# Patient Record
Sex: Male | Born: 1950 | Race: Black or African American | Hispanic: No | Marital: Married | Smoking: Former smoker
Health system: Southern US, Community
[De-identification: ages and names within clinical notes are randomized; demographics above are authoritative.]

## PROBLEM LIST (undated history)

## (undated) ENCOUNTER — Emergency Department (HOSPITAL_COMMUNITY): Discharge status: Absent without leave | Payer: Medicare Other

## (undated) DIAGNOSIS — J45909 Unspecified asthma, uncomplicated: Secondary | ICD-10-CM

## (undated) DIAGNOSIS — I248 Other forms of acute ischemic heart disease: Secondary | ICD-10-CM

## (undated) DIAGNOSIS — E669 Obesity, unspecified: Secondary | ICD-10-CM

## (undated) DIAGNOSIS — N183 Chronic kidney disease, stage 3 unspecified: Secondary | ICD-10-CM

## (undated) DIAGNOSIS — I340 Nonrheumatic mitral (valve) insufficiency: Secondary | ICD-10-CM

## (undated) DIAGNOSIS — D631 Anemia in chronic kidney disease: Secondary | ICD-10-CM

## (undated) DIAGNOSIS — R7989 Other specified abnormal findings of blood chemistry: Secondary | ICD-10-CM

## (undated) DIAGNOSIS — I219 Acute myocardial infarction, unspecified: Secondary | ICD-10-CM

## (undated) DIAGNOSIS — I2510 Atherosclerotic heart disease of native coronary artery without angina pectoris: Secondary | ICD-10-CM

## (undated) DIAGNOSIS — I5042 Chronic combined systolic (congestive) and diastolic (congestive) heart failure: Secondary | ICD-10-CM

## (undated) DIAGNOSIS — D649 Anemia, unspecified: Secondary | ICD-10-CM

## (undated) DIAGNOSIS — M1990 Unspecified osteoarthritis, unspecified site: Secondary | ICD-10-CM

## (undated) DIAGNOSIS — I472 Ventricular tachycardia: Secondary | ICD-10-CM

## (undated) DIAGNOSIS — I4729 Other ventricular tachycardia: Secondary | ICD-10-CM

## (undated) DIAGNOSIS — I5043 Acute on chronic combined systolic (congestive) and diastolic (congestive) heart failure: Secondary | ICD-10-CM

## (undated) DIAGNOSIS — E119 Type 2 diabetes mellitus without complications: Secondary | ICD-10-CM

## (undated) DIAGNOSIS — Z972 Presence of dental prosthetic device (complete) (partial): Secondary | ICD-10-CM

## (undated) DIAGNOSIS — R778 Other specified abnormalities of plasma proteins: Secondary | ICD-10-CM

## (undated) DIAGNOSIS — R9439 Abnormal result of other cardiovascular function study: Secondary | ICD-10-CM

## (undated) DIAGNOSIS — R0600 Dyspnea, unspecified: Secondary | ICD-10-CM

## (undated) DIAGNOSIS — G4730 Sleep apnea, unspecified: Secondary | ICD-10-CM

## (undated) DIAGNOSIS — I10 Essential (primary) hypertension: Secondary | ICD-10-CM

## (undated) DIAGNOSIS — R5381 Other malaise: Secondary | ICD-10-CM

## (undated) DIAGNOSIS — K639 Disease of intestine, unspecified: Secondary | ICD-10-CM

## (undated) DIAGNOSIS — Z9289 Personal history of other medical treatment: Secondary | ICD-10-CM

## (undated) DIAGNOSIS — J189 Pneumonia, unspecified organism: Secondary | ICD-10-CM

## (undated) DIAGNOSIS — R600 Localized edema: Secondary | ICD-10-CM

## (undated) DIAGNOSIS — E1129 Type 2 diabetes mellitus with other diabetic kidney complication: Secondary | ICD-10-CM

## (undated) DIAGNOSIS — D472 Monoclonal gammopathy: Secondary | ICD-10-CM

## (undated) DIAGNOSIS — N185 Chronic kidney disease, stage 5: Secondary | ICD-10-CM

## (undated) DIAGNOSIS — E785 Hyperlipidemia, unspecified: Secondary | ICD-10-CM

## (undated) DIAGNOSIS — E291 Testicular hypofunction: Secondary | ICD-10-CM

## (undated) DIAGNOSIS — J4521 Mild intermittent asthma with (acute) exacerbation: Secondary | ICD-10-CM

## (undated) DIAGNOSIS — L739 Follicular disorder, unspecified: Secondary | ICD-10-CM

## (undated) DIAGNOSIS — I110 Hypertensive heart disease with heart failure: Secondary | ICD-10-CM

## (undated) DIAGNOSIS — D509 Iron deficiency anemia, unspecified: Secondary | ICD-10-CM

## (undated) DIAGNOSIS — K259 Gastric ulcer, unspecified as acute or chronic, without hemorrhage or perforation: Secondary | ICD-10-CM

## (undated) DIAGNOSIS — K219 Gastro-esophageal reflux disease without esophagitis: Secondary | ICD-10-CM

## (undated) DIAGNOSIS — I5022 Chronic systolic (congestive) heart failure: Secondary | ICD-10-CM

## (undated) DIAGNOSIS — K921 Melena: Secondary | ICD-10-CM

## (undated) DIAGNOSIS — I739 Peripheral vascular disease, unspecified: Secondary | ICD-10-CM

## (undated) DIAGNOSIS — G4733 Obstructive sleep apnea (adult) (pediatric): Secondary | ICD-10-CM

## (undated) DIAGNOSIS — Z86010 Personal history of colonic polyps: Secondary | ICD-10-CM

## (undated) HISTORY — PX: EYE SURGERY: SHX253

## (undated) HISTORY — DX: Sleep apnea, unspecified: G47.30

## (undated) HISTORY — DX: Unspecified asthma, uncomplicated: J45.909

## (undated) HISTORY — DX: Disease of intestine, unspecified: K63.9

## (undated) HISTORY — DX: Other specified abnormalities of plasma proteins: R77.8

## (undated) HISTORY — DX: Gastric ulcer, unspecified as acute or chronic, without hemorrhage or perforation: K25.9

## (undated) HISTORY — DX: Hyperlipidemia, unspecified: E78.5

## (undated) HISTORY — PX: BONE MARROW BIOPSY: SHX199

## (undated) HISTORY — DX: Anemia, unspecified: D64.9

## (undated) HISTORY — DX: Essential (primary) hypertension: I10

## (undated) HISTORY — PX: REFRACTIVE SURGERY: SHX103

## (undated) HISTORY — DX: Obstructive sleep apnea (adult) (pediatric): G47.33

## (undated) HISTORY — DX: Other specified abnormal findings of blood chemistry: R79.89

## (undated) HISTORY — PX: BACK SURGERY: SHX140

---
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
## 1988-02-13 HISTORY — PX: HERNIA REPAIR: SHX51

## 2001-02-12 HISTORY — PX: COLONOSCOPY: SHX174

## 2001-12-29 ENCOUNTER — Ambulatory Visit (HOSPITAL_COMMUNITY): Admission: RE | Admit: 2001-12-29 | Discharge: 2001-12-29 | Payer: Self-pay | Admitting: Internal Medicine

## 2002-12-10 ENCOUNTER — Emergency Department (HOSPITAL_COMMUNITY): Admission: EM | Admit: 2002-12-10 | Discharge: 2002-12-11 | Payer: Self-pay | Admitting: Emergency Medicine

## 2003-10-15 ENCOUNTER — Emergency Department (HOSPITAL_COMMUNITY): Admission: EM | Admit: 2003-10-15 | Discharge: 2003-10-15 | Payer: Self-pay | Admitting: Emergency Medicine

## 2003-10-17 ENCOUNTER — Emergency Department (HOSPITAL_COMMUNITY): Admission: EM | Admit: 2003-10-17 | Discharge: 2003-10-17 | Payer: Self-pay | Admitting: Emergency Medicine

## 2003-10-25 ENCOUNTER — Encounter: Admission: RE | Admit: 2003-10-25 | Discharge: 2003-10-25 | Payer: Self-pay | Admitting: Family Medicine

## 2003-11-01 ENCOUNTER — Encounter: Admission: RE | Admit: 2003-11-01 | Discharge: 2003-11-01 | Payer: Self-pay | Admitting: Family Medicine

## 2003-11-12 ENCOUNTER — Ambulatory Visit (HOSPITAL_COMMUNITY): Admission: RE | Admit: 2003-11-12 | Discharge: 2003-11-12 | Payer: Self-pay | Admitting: Orthopedic Surgery

## 2003-11-26 ENCOUNTER — Ambulatory Visit (HOSPITAL_COMMUNITY): Admission: RE | Admit: 2003-11-26 | Discharge: 2003-11-27 | Payer: Self-pay | Admitting: Neurosurgery

## 2004-02-13 HISTORY — PX: LUMBAR DISC SURGERY: SHX700

## 2006-03-06 ENCOUNTER — Emergency Department (HOSPITAL_COMMUNITY): Admission: EM | Admit: 2006-03-06 | Discharge: 2006-03-06 | Payer: Self-pay | Admitting: Emergency Medicine

## 2009-02-12 HISTORY — PX: COLONOSCOPY: SHX174

## 2009-04-13 ENCOUNTER — Ambulatory Visit: Payer: Self-pay | Admitting: Internal Medicine

## 2009-04-13 DIAGNOSIS — Z860100 Personal history of colon polyps, unspecified: Secondary | ICD-10-CM

## 2009-04-13 HISTORY — DX: Personal history of colon polyps, unspecified: Z86.0100

## 2009-04-13 HISTORY — DX: Personal history of colonic polyps: Z86.010

## 2009-04-21 ENCOUNTER — Ambulatory Visit (HOSPITAL_COMMUNITY): Admission: RE | Admit: 2009-04-21 | Discharge: 2009-04-21 | Payer: Self-pay | Admitting: Internal Medicine

## 2009-04-21 ENCOUNTER — Ambulatory Visit: Payer: Self-pay | Admitting: Internal Medicine

## 2010-03-14 NOTE — Letter (Signed)
Summary: TCS ORDER  TCS ORDER   Imported By: Sofie Rower 04/13/2009 12:08:46  _____________________________________________________________________  External Attachment:    Type:   Image     Comment:   External Document

## 2010-03-14 NOTE — Assessment & Plan Note (Signed)
Summary: HX OF POYLPS, DUE FOR TCS/SS   Visit Type:  new patient Primary Care Provider:  Dr. Dellia Nims, [facility]  Chief Complaint:  due for tcs, no problems, and hx of polyps.  History of Present Illness: [name] is a pleasant 60 y/o male who presents today for h/o colon polyps. He is due for surveillance TCS. H/O suspicious lesion at ICV, benign biopsy in 11/03. Denies constipation, diarrhea, melena, brbpr, abd pain, gerd, n/v, dysphagia. Intentional 20 pound weight loss on Nutrisystem.  Current Medications (verified): 1)  Lantus 100 Unit/ml Soln (Insulin Glargine) .Marland Kitchen.. 12 U Once Daily 2)  Metformin Hcl 500 Mg Tabs (Metformin Hcl) .... Two Times A Day 3)  Advair Hfa 45-21 Mcg/act Aero (Fluticasone-Salmeterol) .... Once Daily 4)  Singulair 10 Mg Tabs (Montelukast Sodium) .... Once Daily 5)  Aleve 220 Mg Tabs (Naproxen Sodium) .... As Needed 6)  Aspir-Low 81 Mg Tbec (Aspirin) .... Once Daily  Allergies (verified): No Known Drug Allergies  Past History:  Past Medical History: TCS 11/03, Dr. [name], few scattered pancolonic diverticula. Suspicious lesion on the proximal side of ICV. Bx benign with focal intense inflammatory process s/o inflammatory polyp.  Diabetes Asthma Chronic left shoulder pain  Past Surgical History: Back surgery  Family History: Paternal uncle, colon cancer, 62+ yrs old Mother's side, DM Father, TIA in 37s No FH of liver disease or chronic GI illnesses.  Social History: Married. Grown daughter. 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 Never smoked cigarette smoker. Smoked cigars for 5 years, 2000-2005. Occ red wine. No drug use.  Review of Systems General:  Denies fever, chills, sweats, anorexia, fatigue, and weakness. Eyes:  Denies vision loss. ENT:  Denies nasal congestion, sore throat, hoarseness, and difficulty swallowing. CV:  Denies chest pains, angina, palpitations, dyspnea on exertion, and peripheral edema. Resp:  Denies dyspnea at  rest, dyspnea with exercise, and cough. GI:  See HPI. GU:  Denies urinary burning and blood in urine. MS:  Complains of joint pain / LOM. Derm:  Denies rash and itching. Neuro:  Denies weakness, frequent headaches, memory loss, and confusion. Psych:  Denies depression and anxiety. Endo:  Denies unusual weight change. Heme:  Denies bruising and bleeding. Allergy:  Denies hives and rash.  Vital Signs:  Patient profile:   60 year old male Height:      73 inches Weight:      245 pounds BMI:     32.44 Temp:     98.6 degrees F oral Pulse rate:   88 / minute BP sitting:   138 / 82  (left arm) Cuff size:   regular  Vitals Entered By: Burnadette Peter LPN (March  2, 624THL D34-534 AM)  Physical Exam  General:  Well developed, well nourished, no acute distress. Head:  Normocephalic and atraumatic. Eyes:  Conjunctivae pink, no scleral icterus.  Mouth:  Oropharyngeal mucosa moist, pink.  No lesions, erythema or exudate.    Neck:  Supple; no masses or thyromegaly. Lungs:  Clear throughout to auscultation. Heart:  Regular rate and rhythm; no murmurs, rubs,  or bruits. Abdomen:  Bowel sounds normal.  Abdomen is soft, nontender, nondistended.  No rebound or guarding.  No hepatosplenomegaly, masses or hernias.  No abdominal bruits.  Rectal:  deferred until time of colonoscopy.   Extremities:  No clubbing, cyanosis, edema or deformities noted. Neurologic:  Alert and  oriented x4;  grossly normal neurologically. Skin:  Intact without significant lesions or rashes. Cervical Nodes:  No significant cervical adenopathy. Psych:  Alert and cooperative. Normal mood and affect.  Impression & Recommendations:  Problem # 1:  COLONIC POLYPS, HX OF (ICD-V12.72)  Due for surveillance TCS. Colonoscopy to be performed in near future.  Risks, alternatives, and benefits including but not limited to the risk of reaction to medication, bleeding, infection, and perforation were addressed.  Patient voiced understanding  and provided verbal consent.   Orders: New Patient Level III [id number])

## 2010-05-07 LAB — GLUCOSE, CAPILLARY: Glucose-Capillary: 174 mg/dL — ABNORMAL HIGH (ref 70–99)

## 2010-06-30 NOTE — Op Note (Signed)
NAMEFREDERICO, Henry Carroll NO.:  [id number]   MEDICAL RECORD NO.:  [id number]          PATIENT TYPE:  OIB   LOCATION:  3015                         FACILITY:  [facility]   PHYSICIAN:  Marchia Meiers. Vertell Limber, M.D.  DATE OF BIRTH:  [birth year]   DATE OF PROCEDURE:  11/26/2003  DATE OF DISCHARGE:                                 OPERATIVE REPORT   PREOPERATIVE DIAGNOSES:  Left far-lateral disk herniation, L3-4; with  spondylosis, degenerative disk disease and radiculopathy.   POSTOPERATIVE DIAGNOSES:  Left far-lateral disk herniation, L3-4; with  spondylosis, degenerative disk disease and radiculopathy.   PROCEDURE:  Left far-lateral microdiskectomy, L3-4; with microdissection  with Metrix retractor system.   SURGEON:  Marchia Meiers. Vertell Limber, M.D.   ASSISTANT:  Earleen Newport, M.D.   ANESTHESIA:  General endotracheal.   ESTIMATED BLOOD LOSS:  Minimal.   COMPLICATIONS:  None.   DISPOSITION:  To recovery.   INDICATIONS:  Henry Carroll is a 60 year old man with severe left leg  pain and an L3 radiculopathy.  He was felt to have a foraminal and extra-  foraminal disk herniation at L3-4 level on the left, causing the left L3  radiculopathy.  It was elected to take him to surgery for microdiskectomy.   PROCEDURE:  Henry Carroll was brought to the operating room.  Following the  satisfactory and uncomplicated induction of general endotracheal anesthesia  and placement of intravenous lines, the patient was placed in a prone  position on the Wilson frame.  His low back was prepped and draped in the  usual sterile fashion.  C-arm fluoroscopy was brought in, identifying the  left L3 pars interarticularis and infiltrating the skin and subcutaneous  tissues with 0.25% Marcaine and 0.5% lidocaine with 1:200,000 epinephrine.  Then a stab incision was made approximately 2 cm in length, and the  Steinmann pin was placed overlying the left L3 pars interarticularis.  The  sequential dilators  were then used and to get the point of a 22 mm x 7 cm  tubular retractor, which was anchored to the bed.  The muscle overlying the  pars was then cauterized and removed with biting Kerrison's.  A pars was  then thinned with a high-speed drill, and then removed along with the  superior aspect of the L3-4 facet on the left.  The ligamentous tissue was  then removed.  This was all done under microscopic visualization.   The L3 nerve root was identified as it coursed extra-foraminally.  There  were multiple significant fragments of herniated disk material, which were  compressed in the left L3 nerve root.  These were mobilized using a variety  of hooks, and microdissectors.  Some of these were quite difficult to  remove, but eventually they were removed with resultant decompression of the  left L3 nerve root.  The nerve root appeared to take a more natural course;  did not appear to be under significant pressure.   At this point the wound was then copiously with Bacitracin, saline and 80 mg  of Depo-Medrol and  2 cc of fentanyl placed into the operative bed.  The retractor  was then  removed.  The lumbodorsal fascia was closed with 2-0 Vicryl sutures, and the  skin edges reapproximated with interrupted 3-0 Vicryl subcuticular stitch.  The wound was dressed with Dermabond.   The patient was extubated in the operating room and taken to the recovery  room in stable, satisfactory condition; having tolerated his operation well.  Counts were correct at the end of the case.      Jose   JDS/MEDQ  D:  11/26/2003  T:  11/26/2003  Job:  [id number]

## 2010-06-30 NOTE — Op Note (Signed)
NAME:  Henry Carroll, Henry Carroll                     ACCOUNT NO.:  [id number]   MEDICAL RECORD NO.:  [id number]                   PATIENT TYPE:  AMB   LOCATION:  DAY                                  FACILITY:  [facility]   PHYSICIAN:  R. Garfield Cornea, M.D.              DATE OF BIRTH:  Sep 08, 1950   DATE OF PROCEDURE:  12/29/2001  DATE OF DISCHARGE:                                 OPERATIVE REPORT   PROCEDURE:  Colonoscopy with biopsy.   INDICATION FOR PROCEDURE:  The patient is a 60 year old gentleman referred  out of the courtesy of Robyn Haber, M.D., for screening colonoscopy.  He  is devoid of any lower GI tract symptoms.  He has no family history of  colorectal neoplasia.  Reportedly he underwent what sounds like possibly  colonoscopy in 1995 by Dr. Rowe Pavy in [place] for blood in the stool.  There  were no significant findings.  Colonoscopy is now being done as a standard  screening maneuver.  This approach has been discussed with the patient at  length.  Potential risks, benefits, and alternatives have been reviewed and  questions answered.  ASA-2.   DESCRIPTION OF PROCEDURE:  O2 saturation, blood pressure, pulse,  respirations were monitored throughout the entire procedure.  Conscious  sedation:  Versed 3 mg IV, Demerol 75 mg IV in divided doses.  Instrument:  Olympus video chip adult colonoscope.   FINDINGS:  Digital rectal examination revealed no abnormalities.   Endoscopic findings:  The prep was good.   Rectum:  Examination of the rectal mucosa, including retroflexed view of the  anal verge, revealed no abnormalities.   Colon:  The colonic mucosa was surveyed from the rectosigmoid junction  through the left, transverse, and right colon to the area of the appendiceal  orifice, ileocecal valve, and the cecum.  These structures were well-seen.   The patient had scattered pancolonic diverticula.   The following additional abnormality was noted:  There was a suspicious  irregular  adenomatous-appearing lesion barely discernible coming out behind  the ileocecal valve, the total extent of which could not be visualized due  to the location.  Please see photos.  It appeared to be too irregular just  to be part of the normal effaced ileal mucosa.  Please see photos.  It was  biopsied.  A couple of the biopsy samples came off in chunks.  Again, the  total extent could not be seen well due to this lesion being tucked behind  the ileocecal valve (proximally).  From this level the scope was withdrawn,  and all previously-mentioned mucosal surfaces were again seen.  No other  abnormalities were observed.  The patient tolerated the procedure well, was  reactive in endoscopy.   IMPRESSION:  1. Normal rectum.  2. A few scattered pancolonic diverticula.  3. Suspicious lesion on the proximal side of the ileocecal valve.  The total     extent could not be  appreciated.  It was biopsied multiple times.  The     remainder of colonic mucosa appeared normal.   RECOMMENDATIONS:  1. Diverticulosis literature provided to the patient.  2. Follow up on pathology.  3. Further recommendations to follow.                                               Bridgette Habermann, M.D.    RMR/MEDQ  D:  12/29/2001  T:  12/29/2001  Job:  [id number]   cc:   Robyn Haber, M.D.  [place]  [place] [address]  Fax: [phone]

## 2011-07-20 ENCOUNTER — Ambulatory Visit: Payer: Self-pay | Admitting: *Deleted

## 2011-08-15 ENCOUNTER — Emergency Department (HOSPITAL_COMMUNITY)
Admission: EM | Admit: 2011-08-15 | Discharge: 2011-08-16 | Disposition: A | Discharge status: Home / Self Care | Payer: Federal, State, Local not specified - PPO | Attending: Emergency Medicine | Admitting: Emergency Medicine

## 2011-08-15 ENCOUNTER — Encounter (HOSPITAL_COMMUNITY): Payer: Self-pay | Admitting: *Deleted

## 2011-08-15 DIAGNOSIS — S0180XA Unspecified open wound of other part of head, initial encounter: Secondary | ICD-10-CM | POA: Insufficient documentation

## 2011-08-15 DIAGNOSIS — IMO0002 Reserved for concepts with insufficient information to code with codable children: Secondary | ICD-10-CM | POA: Insufficient documentation

## 2011-08-15 DIAGNOSIS — E119 Type 2 diabetes mellitus without complications: Secondary | ICD-10-CM | POA: Insufficient documentation

## 2011-08-15 DIAGNOSIS — Z79899 Other long term (current) drug therapy: Secondary | ICD-10-CM | POA: Insufficient documentation

## 2011-08-15 DIAGNOSIS — Z794 Long term (current) use of insulin: Secondary | ICD-10-CM | POA: Insufficient documentation

## 2011-08-15 MED ORDER — LIDOCAINE HCL (PF) 1 % IJ SOLN
INTRAMUSCULAR | Status: AC
  Administered 2011-08-16
  Filled 2011-08-15: qty 5
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
## 2011-08-16 NOTE — ED Provider Notes (Signed)
Medical screening examination/treatment/procedure(s) were conducted as a shared visit with non-physician practitioner(s) and myself.  I personally evaluated the patient during the encounter  Henry Carroll. Olin Hauser, MD 08/16/11 [id number]

## 2011-08-16 NOTE — ED Provider Notes (Signed)
I was asked by the EDP to repair a laceration to the left eyebrow.  This was my only involvement in this patient's care.    LACERATION REPAIR Performed by: Malley Hauter L. Authorized by: Hale Bogus Consent: Verbal consent obtained. Risks and benefits: risks, benefits and alternatives were discussed Consent given by: patient Patient identity confirmed: provided demographic data Prepped and Draped in normal sterile fashion Wound explored  Laceration Location: left eyebrow Laceration Length:  2.5 cm  No Foreign Bodies seen or palpated  Anesthesia: local infiltration  Local anesthetic: lidocaine 1 % w/o epinephrine  Anesthetic total: 2 ml  Irrigation method: syringe Amount of cleaning: standard  Skin closure: 6-0 prolene Number of sutures: 5 Technique: simple interrupted  Patient tolerance: Patient tolerated the procedure well with no immediate complications. Wound edges are well approximated  Henry Carroll L. [place], [place] 08/16/11 0010

## 2011-09-04 ENCOUNTER — Ambulatory Visit (INDEPENDENT_AMBULATORY_CARE_PROVIDER_SITE_OTHER): Payer: Federal, State, Local not specified - PPO | Admitting: Family Medicine

## 2011-09-04 VITALS — BP 140/70 | HR 80 | Temp 98.9°F | Resp 18 | Ht 71.75 in | Wt 255.0 lb

## 2011-09-04 DIAGNOSIS — S0180XA Unspecified open wound of other part of head, initial encounter: Secondary | ICD-10-CM

## 2011-09-04 DIAGNOSIS — S0181XA Laceration without foreign body of other part of head, initial encounter: Secondary | ICD-10-CM

## 2011-09-04 NOTE — Progress Notes (Signed)
  Subjective:    Patient ID: Henry Carroll, male    DOB: [birth year], 61 y.o.   MRN: [id number]  HPI Comments: Here to have left eyebrow sutures removed put in at [facility] 5 days ago.  No complaints.  Suture / Staple Removal   Sutures removed without incident.  Wound intact.   Review of Systems     Objective:   Physical Exam Intact left brow wound with 5 interrupted sutures.  No signs of infection.       Assessment & Plan:  Left brow wound  S/p suture removal.  F/u as needed

## 2011-12-16 ENCOUNTER — Ambulatory Visit (INDEPENDENT_AMBULATORY_CARE_PROVIDER_SITE_OTHER): Payer: Federal, State, Local not specified - PPO | Admitting: Family Medicine

## 2011-12-16 ENCOUNTER — Ambulatory Visit: Payer: Federal, State, Local not specified - PPO

## 2011-12-16 VITALS — BP 139/80 | HR 89 | Temp 97.9°F | Resp 18 | Ht 73.0 in | Wt 255.8 lb

## 2011-12-16 DIAGNOSIS — R05 Cough: Secondary | ICD-10-CM

## 2011-12-16 DIAGNOSIS — R059 Cough, unspecified: Secondary | ICD-10-CM

## 2011-12-16 DIAGNOSIS — E1065 Type 1 diabetes mellitus with hyperglycemia: Secondary | ICD-10-CM

## 2011-12-16 DIAGNOSIS — J40 Bronchitis, not specified as acute or chronic: Secondary | ICD-10-CM

## 2011-12-16 LAB — POCT CBC
Granulocyte percent: 46.5 %G (ref 37–80)
HCT, POC: 42.7 % — AB (ref 43.5–53.7)
Hemoglobin: 13.3 g/dL — AB (ref 14.1–18.1)
Lymph, poc: 2 (ref 0.6–3.4)
MCH, POC: 30 pg (ref 27–31.2)
MCHC: 31.1 g/dL — AB (ref 31.8–35.4)
MCV: 96.4 fL (ref 80–97)
MID (cbc): 0.5 (ref 0–0.9)
MPV: 7.1 fL (ref 0–99.8)
POC Granulocyte: 2.1 (ref 2–6.9)
POC LYMPH PERCENT: 42.8 %L (ref 10–50)
POC MID %: 10.7 %M (ref 0–12)
Platelet Count, POC: 378 10*3/uL (ref 142–424)
RBC: 4.43 M/uL — AB (ref 4.69–6.13)
RDW, POC: 13.6 %
WBC: 4.6 10*3/uL (ref 4.6–10.2)

## 2011-12-16 LAB — GLUCOSE, POCT (MANUAL RESULT ENTRY): POC Glucose: 265 mg/dl — AB (ref 70–99)

## 2011-12-16 MED ORDER — HYDROCODONE-HOMATROPINE 5-1.5 MG/5ML PO SYRP: 5.0000 mL | ORAL_SOLUTION | Freq: Three times a day (TID) | ORAL | Status: DC | PRN

## 2011-12-16 MED ORDER — AZITHROMYCIN 250 MG PO TABS: ORAL_TABLET | ORAL | Status: DC

## 2011-12-16 NOTE — Progress Notes (Signed)
@[facility]FCLOGO @   Patient ID: Henry Carroll MRN: [id number], DOB: 07-06-1950, 61 y.o. Date of Encounter: 12/16/2011, 3:59 PM  Primary Physician: No primary provider on file.  Chief Complaint:  Chief Complaint  Patient presents with  . Cough    been fighting a cold with cough, congestion and runny nose for two weeks.  also unable to have a bowel movement since Thursday  . Nasal Congestion    HPI: 61 y.o. year old male presents with a 14 day history of nasal congestion, post nasal drip, sore throat, and cough. Mild sinus pressure. Afebrile. No chills. Nasal congestion thick and green/yellow. Cough is productive of green/yellow sputum and not associated with time of day. Ears feel full, leading to sensation of muffled hearing. Has tried OTC cold preps without success. No GI complaints. Appetite decreased  Notes some constipation Last A1C 8.0   sick contacts coworker was coughing on recent trip to [place]  No  recent antibiotics  No leg trauma, sedentary periods, h/o cancer, or tobacco use.  Past Medical History  Diagnosis Date  . Diabetes mellitus   . Allergy   . Asthma   . Hypertension      Home Meds: Prior to Admission medications   Medication Sig Start Date End Date Taking? Authorizing Provider  albuterol (PROVENTIL) (2.5 MG/3ML) 0.083% nebulizer solution Take 2.5 mg by nebulization every 6 (six) hours as needed.   Yes Historical Provider, MD  Aliskiren Fumarate (TEKTURNA PO) Take 1 tablet by mouth daily.   Yes Historical Provider, MD  aspirin EC 81 MG tablet Take 81 mg by mouth daily.   Yes Historical Provider, MD  Coenzyme Q10 (CO Q 10 PO) Take 1 tablet by mouth daily.   Yes Historical Provider, MD  insulin lispro protamine-insulin lispro (HUMALOG 75/25) (75-25) 100 UNIT/ML SUSP Inject 30 Units into the skin 2 (two) times daily.   Yes Historical Provider, MD  metFORMIN (GLUCOPHAGE) 500 MG tablet Take 500 mg by mouth 2 (two) times daily.   Yes Historical Provider, MD    Multiple Vitamin (MULTIVITAMIN WITH MINERALS) TABS Take 1 tablet by mouth daily.   Yes Historical Provider, MD  Naproxen Sodium (ALEVE) 220 MG CAPS Take 220 mg by mouth as needed.   Yes Historical Provider, MD  terbinafine (LAMISIL) 1 % cream Apply 1 application topically daily as needed.   Yes Historical Provider, MD    Allergies: No Known Allergies  History   Social History  . Marital Status: Married    Spouse Name: N/A    Number of Children: N/A  . Years of Education: N/A   Occupational History  . Not on file.   Social History Main Topics  . Smoking status: Never Smoker   . Smokeless tobacco: Not on file  . Alcohol Use: Yes  . Drug Use: No  . Sexually Active: Yes    Birth Control/ Protection: None   Other Topics Concern  . Not on file   Social History Narrative  . No narrative on file     Review of Systems: Constitutional: negative for chills, fever, night sweats or weight changes Cardiovascular: negative for chest pain or palpitations Respiratory: negative for hemoptysis, wheezing, or shortness of breath Abdominal: negative for abdominal pain, nausea, vomiting or diarrhea Dermatological: negative for rash Neurologic: negative for headache   Physical Exam: Blood pressure 139/80, pulse 89, temperature 97.9 F (36.6 C), temperature source Oral, resp. rate 18, height 6\' 1"  (1.854 m), weight 255 lb 12.8 oz (116.03 kg), SpO2  98.00%., Body mass index is 33.75 kg/(m^2). General: Well developed, well nourished, in no acute distress. Head: Normocephalic, atraumatic, eyes without discharge, sclera non-icteric, nares are congested. Bilateral auditory canals clear, TM's are without perforation, pearly grey with reflective cone of light bilaterally. No sinus TTP. Oral cavity moist, dentition normal. Posterior pharynx with post nasal drip and mild erythema. No peritonsillar abscess or tonsillar exudate. Neck: Supple. No thyromegaly. Full ROM. No lymphadenopathy. Lungs: Coarse  breath sounds bilaterally without wheezes, rales, or rhonchi. Breathing is unlabored.  Heart: RRR with S1 S2. No murmurs, rubs, or gallops appreciated. Msk:  Strength and tone normal for age. Extremities: No clubbing or cyanosis. No edema. Neuro: Alert and oriented X 3. Moves all extremities spontaneously. CNII-XII grossly in tact. Psych:  Responds to questions appropriately with a normal affect.   Labs: Results for orders placed in visit on 12/16/11  POCT CBC      Component Value Range   WBC 4.6  4.6 - 10.2 K/uL   Lymph, poc 2.0  0.6 - 3.4   POC LYMPH PERCENT 42.8  10 - 50 %L   MID (cbc) 0.5  0 - 0.9   POC MID % 10.7  0 - 12 %M   POC Granulocyte 2.1  2 - 6.9   Granulocyte percent 46.5  37 - 80 %G   RBC 4.43 (*) 4.69 - 6.13 M/uL   Hemoglobin 13.3 (*) 14.1 - 18.1 g/dL   HCT, POC 42.7 (*) 43.5 - 53.7 %   MCV 96.4  80 - 97 fL   MCH, POC 30.0  27 - 31.2 pg   MCHC 31.1 (*) 31.8 - 35.4 g/dL   RDW, POC 13.6     Platelet Count, POC 378  142 - 424 K/uL   MPV 7.1  0 - 99.8 fL  GLUCOSE, POCT (MANUAL RESULT ENTRY)      Component Value Range   POC Glucose 265 (*) 70 - 99 mg/dl   [facility] reading (PRIMARY) by  Dr. Joseph Art:  Negative for infiltrate, nipple shadow on left.     ASSESSMENT AND PLAN:  61 y.o. year old male with bronchitis. 1. Cough  POCT CBC, POCT glucose (manual entry), DG Chest 2 View, azithromycin (ZITHROMAX Z-PAK) 250 MG tablet, HYDROcodone-homatropine (HYCODAN) 5-1.5 MG/5ML syrup    - -Mucinex -Tylenol/Motrin prn -Rest/fluids -RTC precautions -RTC 3-5 days if no improvement  Signed, Robyn Haber, MD 12/16/2011 3:59 PM

## 2011-12-18 ENCOUNTER — Telehealth: Payer: Self-pay

## 2011-12-18 NOTE — Telephone Encounter (Signed)
Spoke with pharm. Abx waiting for p/u. Pt's wife notified.

## 2011-12-18 NOTE — Telephone Encounter (Signed)
Patient was told that he was going to get an antibiotic for Bronchitis from [place] yesterday but when he went to his pharmacy there was only an Rx for cough medication. Please call back patient as soon as possible. Thank you!

## 2012-01-18 ENCOUNTER — Ambulatory Visit: Payer: Federal, State, Local not specified - PPO

## 2012-01-18 ENCOUNTER — Encounter: Payer: Self-pay | Admitting: Internal Medicine

## 2012-01-18 ENCOUNTER — Ambulatory Visit (INDEPENDENT_AMBULATORY_CARE_PROVIDER_SITE_OTHER): Payer: Federal, State, Local not specified - PPO | Admitting: Internal Medicine

## 2012-01-18 VITALS — BP 150/70 | HR 61 | Temp 98.0°F | Resp 16 | Ht 73.0 in | Wt 257.6 lb

## 2012-01-18 DIAGNOSIS — E119 Type 2 diabetes mellitus without complications: Secondary | ICD-10-CM

## 2012-01-18 DIAGNOSIS — R61 Generalized hyperhidrosis: Secondary | ICD-10-CM

## 2012-01-18 DIAGNOSIS — J189 Pneumonia, unspecified organism: Secondary | ICD-10-CM

## 2012-01-18 DIAGNOSIS — E162 Hypoglycemia, unspecified: Secondary | ICD-10-CM

## 2012-01-18 LAB — POCT CBC
Granulocyte percent: 35.3 %G — AB (ref 37–80)
HCT, POC: 43.8 % (ref 43.5–53.7)
Hemoglobin: 13.5 g/dL — AB (ref 14.1–18.1)
Lymph, poc: 3.5 — AB (ref 0.6–3.4)
MCH, POC: 30.1 pg (ref 27–31.2)
MCHC: 30.8 g/dL — AB (ref 31.8–35.4)
MCV: 97.5 fL — AB (ref 80–97)
MID (cbc): 0.7 (ref 0–0.9)
MPV: 7.6 fL (ref 0–99.8)
POC Granulocyte: 2.3 (ref 2–6.9)
POC LYMPH PERCENT: 54.4 %L — AB (ref 10–50)
POC MID %: 10.3 %M (ref 0–12)
Platelet Count, POC: 393 10*3/uL (ref 142–424)
RBC: 4.49 M/uL — AB (ref 4.69–6.13)
RDW, POC: 14.2 %
WBC: 6.4 10*3/uL (ref 4.6–10.2)

## 2012-01-18 LAB — GLUCOSE, POCT (MANUAL RESULT ENTRY): POC Glucose: 39 mg/dl — AB (ref 70–99)

## 2012-01-18 MED ORDER — CEFTRIAXONE SODIUM 1 G IJ SOLR
1.0000 g | Freq: Once | INTRAMUSCULAR | Status: AC
  Administered 2012-01-18: 1 g via INTRAMUSCULAR

## 2012-01-18 MED ORDER — HYDROCODONE-ACETAMINOPHEN 7.5-325 MG/15ML PO SOLN: 5.0000 mL | Freq: Four times a day (QID) | ORAL | Status: DC | PRN

## 2012-01-18 MED ORDER — AZITHROMYCIN 500 MG PO TABS: 500.0000 mg | ORAL_TABLET | Freq: Every day | ORAL | Status: DC

## 2012-01-18 NOTE — Patient Instructions (Addendum)
Cough, Adult  A cough is a reflex that helps clear your throat and airways. It can help heal the body or may be a reaction to an irritated airway. A cough may only last 2 or 3 weeks (acute) or may last more than 8 weeks (chronic).  CAUSES Acute cough:  Viral or bacterial infections. Chronic cough:  Infections.  Allergies.  Asthma.  Post-nasal drip.  Smoking.  Heartburn or acid reflux.  Some medicines.  Chronic lung problems (COPD).  Cancer. SYMPTOMS   Cough.  Fever.  Chest pain.  Increased breathing rate.  High-pitched whistling sound when breathing (wheezing).  Colored mucus that you cough up (sputum). TREATMENT   A bacterial cough may be treated with antibiotic medicine.  A viral cough must run its course and will not respond to antibiotics.  Your caregiver may recommend other treatments if you have a chronic cough. HOME CARE INSTRUCTIONS   Only take over-the-counter or prescription medicines for pain, discomfort, or fever as directed by your caregiver. Use cough suppressants only as directed by your caregiver.  Use a cold steam vaporizer or humidifier in your bedroom or home to help loosen secretions.  Sleep in a semi-upright position if your cough is worse at night.  Rest as needed.  Stop smoking if you smoke. SEEK IMMEDIATE MEDICAL CARE IF:   You have pus in your sputum.  Your cough starts to worsen.  You cannot control your cough with suppressants and are losing sleep.  You begin coughing up blood.  You have difficulty breathing.  You develop pain which is getting worse or is uncontrolled with medicine.  You have a fever. MAKE SURE YOU:   Understand these instructions.  Will watch your condition.  Will get help right away if you are not doing well or get worse. Document Released: 07/28/2010 Document Revised: 04/23/2011 Document Reviewed: 07/28/2010 [facility] Patient Information 2013 [place]. Hypoglycemia (Low Blood  Sugar) Hypoglycemia is when the glucose (sugar) in your blood is too low. Hypoglycemia can happen for many reasons. It can happen to people with or without diabetes. Hypoglycemia can develop quickly and can be a medical emergency.  CAUSES  Having hypoglycemia does not mean that you will develop diabetes. Different causes include:  Missed or delayed meals or not enough carbohydrates eaten.  Medication overdose. This could be by accident or deliberate. If by accident, your medication may need to be adjusted or changed.  Exercise or increased activity without adjustments in carbohydrates or medications.  A nerve disorder that affects body functions like your heart rate, blood pressure and digestion (autonomic neuropathy).  A condition where the stomach muscles do not function properly (gastroparesis). Therefore, medications may not absorb properly.  The inability to recognize the signs of hypoglycemia (hypoglycemic unawareness).  Absorption of insulin  may be altered.  Alcohol consumption.  Pregnancy/menstrual cycles/postpartum. This may be due to hormones.  Certain kinds of tumors. This is very rare. SYMPTOMS   Sweating.  Hunger.  Dizziness.  Blurred vision.  Drowsiness.  Weakness.  Headache.  Rapid heart beat.  Shakiness.  Nervousness. DIAGNOSIS  Diagnosis is made by monitoring blood glucose in one or all of the following ways:  Fingerstick blood glucose monitoring.  Laboratory results. TREATMENT  If you think your blood glucose is low:  Check your blood glucose, if possible. If it is less than 70 mg/dl, take one of the following:  3-4 glucose tablets.   cup juice (prefer clear like apple).   cup "regular" soda pop.  1 cup milk.  -1 tube of glucose gel.  5-6 hard candies.  Do not over treat because your blood glucose (sugar) will only go too high.  Wait 15 minutes and recheck your blood glucose. If it is still less than 70 mg/dl (or below your  target range), repeat treatment.  Eat a snack if it is more than one hour until your next meal. Sometimes, your blood glucose may go so low that you are unable to treat yourself. You may need someone to help you. You may even pass out or be unable to swallow. This may require you to get an injection of glucagon, which raises the blood glucose. HOME CARE INSTRUCTIONS  Check blood glucose as recommended by your caregiver.  Take medication as prescribed by your caregiver.  Follow your meal plan. Do not skip meals. Eat on time.  If you are going to drink alcohol, drink it only with meals.  Check your blood glucose before driving.  Check your blood glucose before and after exercise. If you exercise longer or different than usual, be sure to check blood glucose more frequently.  Always carry treatment with you. Glucose tablets are the easiest to carry.  Always wear medical alert jewelry or carry some form of identification that states that you have diabetes. This will alert people that you have diabetes. If you have hypoglycemia, they will have a better idea on what to do. SEEK MEDICAL CARE IF:   You are having problems keeping your blood sugar at target range.  You are having frequent episodes of hypoglycemia.  You feel you might be having side effects from your medicines.  You have symptoms of an illness that is not improving after 3-4 days.  You notice a change in vision or a new problem with your vision. SEEK IMMEDIATE MEDICAL CARE IF:   You are a family member or friend of a person whose blood glucose goes below 70 mg/dl and is accompanied by:  Confusion.  A change in mental status.  The inability to swallow.  Passing out. Document Released: 01/29/2005 Document Revised: 04/23/2011 Document Reviewed: 05/28/2011 [facility] Patient Information 2013 [place]. Pneumonia, Adult Pneumonia is an infection of the lungs.  CAUSES Pneumonia may be caused by bacteria or a  virus. Usually, these infections are caused by breathing infectious particles into the lungs (respiratory tract). SYMPTOMS   Cough.  Fever.  Chest pain.  Increased rate of breathing.  Wheezing.  Mucus production. DIAGNOSIS  If you have the common symptoms of pneumonia, your caregiver will typically confirm the diagnosis with a chest X-ray. The X-ray will show an abnormality in the lung (pulmonary infiltrate) if you have pneumonia. Other tests of your blood, urine, or sputum may be done to find the specific cause of your pneumonia. Your caregiver may also do tests (blood gases or pulse oximetry) to see how well your lungs are working. TREATMENT  Some forms of pneumonia may be spread to other people when you cough or sneeze. You may be asked to wear a mask before and during your exam. Pneumonia that is caused by bacteria is treated with antibiotic medicine. Pneumonia that is caused by the influenza virus may be treated with an antiviral medicine. Most other viral infections must run their course. These infections will not respond to antibiotics.  PREVENTION A pneumococcal shot (vaccine) is available to prevent a common bacterial cause of pneumonia. This is usually suggested for:  People over 4 years old.  Patients on chemotherapy.  People with chronic lung problems, such as bronchitis or emphysema.  People with immune system problems. If you are over 65 or have a high risk condition, you may receive the pneumococcal vaccine if you have not received it before. In some countries, a routine influenza vaccine is also recommended. This vaccine can help prevent some cases of pneumonia.You may be offered the influenza vaccine as part of your care. If you smoke, it is time to quit. You may receive instructions on how to stop smoking. Your caregiver can provide medicines and counseling to help you quit. HOME CARE INSTRUCTIONS   Cough suppressants may be used if you are losing too much rest.  However, coughing protects you by clearing your lungs. You should avoid using cough suppressants if you can.  Your caregiver may have prescribed medicine if he or she thinks your pneumonia is caused by a bacteria or influenza. Finish your medicine even if you start to feel better.  Your caregiver may also prescribe an expectorant. This loosens the mucus to be coughed up.  Only take over-the-counter or prescription medicines for pain, discomfort, or fever as directed by your caregiver.  Do not smoke. Smoking is a common cause of bronchitis and can contribute to pneumonia. If you are a smoker and continue to smoke, your cough may last several weeks after your pneumonia has cleared.  A cold steam vaporizer or humidifier in your room or home may help loosen mucus.  Coughing is often worse at night. Sleeping in a semi-upright position in a recliner or using a couple pillows under your head will help with this.  Get rest as you feel it is needed. Your body will usually let you know when you need to rest. SEEK IMMEDIATE MEDICAL CARE IF:   Your illness becomes worse. This is especially true if you are elderly or weakened from any other disease.  You cannot control your cough with suppressants and are losing sleep.  You begin coughing up blood.  You develop pain which is getting worse or is uncontrolled with medicines.  You have a fever.  Any of the symptoms which initially brought you in for treatment are getting worse rather than better.  You develop shortness of breath or chest pain. MAKE SURE YOU:   Understand these instructions.  Will watch your condition.  Will get help right away if you are not doing well or get worse. Document Released: 01/29/2005 Document Revised: 04/23/2011 Document Reviewed: 04/20/2010 [facility] Patient Information 2013 [place].

## 2012-01-18 NOTE — Progress Notes (Signed)
  Subjective:    Patient ID: Henry Carroll, male    DOB: 09-01-1950, 61 y.o.   MRN: [id number]  HPI See visit with Dr. Joseph Art 11/3. Txed for bronchitis and got better but not well. Now cough worse, cp, no sob. Has worked every day but too sick today Became sweaty and dizzy while talking with me, pulse was irregular--stat EKG ordered Stated no chest pressure, radiculopathy neck or arm, and no hx heart disease.  EKG normal Review of Systems IDDM with [facility]    Objective:   Physical Exam  Vitals reviewed. Constitutional: He is oriented to person, place, and time. He appears well-nourished.  HENT:  Right Ear: External ear normal.  Left Ear: External ear normal.  Nose: Mucosal edema, rhinorrhea and sinus tenderness present.  Mouth/Throat: Oropharyngeal exudate present.  Eyes: EOM are normal.  Cardiovascular: Normal rate, S1 normal and S2 normal.  A regularly irregular rhythm present. Exam reveals no gallop.   No murmur heard. Pulmonary/Chest: Effort normal. He has decreased breath sounds in the left upper field, the left middle field and the left lower field. He has wheezes in the left middle field. He has rhonchi in the left middle field. He has rales in the left middle field.  Neurological: He is alert and oriented to person, place, and time. He exhibits normal muscle tone. Coordination normal.  Skin: He is diaphoretic.  Psychiatric: He has a normal mood and affect.   Diaphoretic---Hypoglycemia stat gatorade and penut butter crackers, recovered  [facility] reading (PRIMARY) by  Dr.Ryder Man probable left middle lung infiltrate  Results for orders placed in visit on 01/18/12  POCT CBC      Component Value Range   WBC 6.4  4.6 - 10.2 K/uL   Lymph, poc 3.5 (*) 0.6 - 3.4   POC LYMPH PERCENT 54.4 (*) 10 - 50 %L   MID (cbc) 0.7  0 - 0.9   POC MID % 10.3  0 - 12 %M   POC Granulocyte 2.3  2 - 6.9   Granulocyte percent 35.3 (*) 37 - 80 %G   RBC 4.49 (*) 4.69 - 6.13 M/uL    Hemoglobin 13.5 (*) 14.1 - 18.1 g/dL   HCT, POC 43.8  43.5 - 53.7 %   MCV 97.5 (*) 80 - 97 fL   MCH, POC 30.1  27 - 31.2 pg   MCHC 30.8 (*) 31.8 - 35.4 g/dL   RDW, POC 14.2     Platelet Count, POC 393  142 - 424 K/uL   MPV 7.6  0 - 99.8 fL  GLUCOSE, POCT (MANUAL RESULT ENTRY)      Component Value Range   POC Glucose 39 (*) 70 - 99 mg/dl   Stat po glucose     Assessment & Plan:  Hypoglycemia counseling Pneumonia Rocephin 1g Zithromax 500mg  5d/Lortab elixir

## 2012-01-21 ENCOUNTER — Ambulatory Visit (INDEPENDENT_AMBULATORY_CARE_PROVIDER_SITE_OTHER): Payer: Federal, State, Local not specified - PPO | Admitting: Internal Medicine

## 2012-01-21 ENCOUNTER — Encounter: Payer: Self-pay | Admitting: Internal Medicine

## 2012-01-21 VITALS — BP 127/72 | HR 68 | Temp 97.7°F | Resp 18 | Wt 257.0 lb

## 2012-01-21 DIAGNOSIS — E162 Hypoglycemia, unspecified: Secondary | ICD-10-CM

## 2012-01-21 DIAGNOSIS — E119 Type 2 diabetes mellitus without complications: Secondary | ICD-10-CM

## 2012-01-21 DIAGNOSIS — R5381 Other malaise: Secondary | ICD-10-CM

## 2012-01-21 DIAGNOSIS — J189 Pneumonia, unspecified organism: Secondary | ICD-10-CM

## 2012-01-21 LAB — GLUCOSE, POCT (MANUAL RESULT ENTRY): POC Glucose: 217 mg/dl — AB (ref 70–99)

## 2012-01-21 MED ORDER — CEFTRIAXONE SODIUM 1 G IJ SOLR: 1.0000 g | Freq: Once | INTRAMUSCULAR | Status: DC

## 2012-01-21 MED ORDER — CEFTRIAXONE SODIUM 1 G IJ SOLR
1.0000 g | INTRAMUSCULAR | Status: DC
  Administered 2012-01-21: 1 g via INTRAMUSCULAR

## 2012-01-21 NOTE — Patient Instructions (Signed)
Pneumonia, Adult °Pneumonia is an infection of the lungs.  °CAUSES °Pneumonia may be caused by bacteria or a virus. Usually, these infections are caused by breathing infectious particles into the lungs (respiratory tract). °SYMPTOMS  °· Cough. °· Fever. °· Chest pain. °· Increased rate of breathing. °· Wheezing. °· Mucus production. °DIAGNOSIS  °If you have the common symptoms of pneumonia, your caregiver will typically confirm the diagnosis with a chest X-ray. The X-ray will show an abnormality in the lung (pulmonary infiltrate) if you have pneumonia. Other tests of your blood, urine, or sputum may be done to find the specific cause of your pneumonia. Your caregiver may also do tests (blood gases or pulse oximetry) to see how well your lungs are working. °TREATMENT  °Some forms of pneumonia may be spread to other people when you cough or sneeze. You may be asked to wear a mask before and during your exam. Pneumonia that is caused by bacteria is treated with antibiotic medicine. Pneumonia that is caused by the influenza virus may be treated with an antiviral medicine. Most other viral infections must run their course. These infections will not respond to antibiotics.  °PREVENTION °A pneumococcal shot (vaccine) is available to prevent a common bacterial cause of pneumonia. This is usually suggested for: °· People over 65 years old. °· Patients on chemotherapy. °· People with chronic lung problems, such as bronchitis or emphysema. °· People with immune system problems. °If you are over 65 or have a high risk condition, you may receive the pneumococcal vaccine if you have not received it before. In some countries, a routine influenza vaccine is also recommended. This vaccine can help prevent some cases of pneumonia. You may be offered the influenza vaccine as part of your care. °If you smoke, it is time to quit. You may receive instructions on how to stop smoking. Your caregiver can provide medicines and counseling to  help you quit. °HOME CARE INSTRUCTIONS  °· Cough suppressants may be used if you are losing too much rest. However, coughing protects you by clearing your lungs. You should avoid using cough suppressants if you can. °· Your caregiver may have prescribed medicine if he or she thinks your pneumonia is caused by a bacteria or influenza. Finish your medicine even if you start to feel better. °· Your caregiver may also prescribe an expectorant. This loosens the mucus to be coughed up. °· Only take over-the-counter or prescription medicines for pain, discomfort, or fever as directed by your caregiver. °· Do not smoke. Smoking is a common cause of bronchitis and can contribute to pneumonia. If you are a smoker and continue to smoke, your cough may last several weeks after your pneumonia has cleared. °· A cold steam vaporizer or humidifier in your room or home may help loosen mucus. °· Coughing is often worse at night. Sleeping in a semi-upright position in a recliner or using a couple pillows under your head will help with this. °· Get rest as you feel it is needed. Your body will usually let you know when you need to rest. °SEEK IMMEDIATE MEDICAL CARE IF:  °· Your illness becomes worse. This is especially true if you are elderly or weakened from any other disease. °· You cannot control your cough with suppressants and are losing sleep. °· You begin coughing up blood. °· You develop pain which is getting worse or is uncontrolled with medicines. °· You have a fever. °· Any of the symptoms which initially brought you in for treatment   You begin coughing up blood.   You develop pain which is getting worse or is uncontrolled with medicines.   You have a fever.   Any of the symptoms which initially brought you in for treatment are getting worse rather than better.   You develop shortness of breath or chest pain.  MAKE SURE YOU:    Understand these instructions.   Will watch your condition.   Will get help right away if you are not doing well or get worse.  Document Released: 01/29/2005 Document Revised: 04/23/2011 Document Reviewed: 04/20/2010  [employer] Patient Information 2013 [employer].

## 2012-01-21 NOTE — Progress Notes (Signed)
  Subjective:    Patient ID: Henry Carroll, male    DOB: 1950-08-13, 61 y.o.   MRN: [id number]  HPI F/up pneumonia and hypoglycemia 50-70% better Very fatigued.   Review of Systems     Objective:   Physical Exam  Vitals reviewed. Constitutional: He is oriented to person, place, and time. He appears well-nourished.  Cardiovascular: Normal rate, regular rhythm and normal heart sounds.   Pulmonary/Chest: Effort normal. Not tachypneic. No respiratory distress. He has no decreased breath sounds. He has wheezes in the left middle field. He has rhonchi in the left upper field, the left middle field and the left lower field. He has no rales.  Neurological: He is alert and oriented to person, place, and time. Coordination normal.  Skin: Skin is warm and dry.  Psychiatric: He has a normal mood and affect.          Assessment & Plan:  Rocephin 1g F/up with your internist

## 2012-04-21 ENCOUNTER — Encounter: Payer: Self-pay | Admitting: Internal Medicine

## 2012-05-19 ENCOUNTER — Ambulatory Visit (INDEPENDENT_AMBULATORY_CARE_PROVIDER_SITE_OTHER): Payer: Federal, State, Local not specified - PPO | Admitting: Pharmacotherapy

## 2012-05-19 ENCOUNTER — Encounter: Payer: Self-pay | Admitting: Pharmacotherapy

## 2012-05-19 VITALS — BP 154/78 | HR 95 | Temp 97.1°F | Resp 16 | Ht 73.0 in | Wt 268.4 lb

## 2012-05-19 DIAGNOSIS — IMO0001 Reserved for inherently not codable concepts without codable children: Secondary | ICD-10-CM

## 2012-05-19 DIAGNOSIS — I10 Essential (primary) hypertension: Secondary | ICD-10-CM

## 2012-05-19 NOTE — Progress Notes (Signed)
  Subjective:    Henry Carroll is a 62 y.o. male who presents for follow-up of Type 2 diabetes mellitus.   He says he is doing "great" He missed his blood work OV last week.  He did eat an orange this morning. Average BG: 133mg /dl - only checked 9 times in 30 days No hypoglycemia  Trying to eat healthy Playing a lot of golf for exercise.  As he is starting to exercise, he realizes that he has very little stamina. Denies problems with feet. Denies problems with vision Nocturia once per night.  He continues Humalog Mix 75/25 50 units twice daily - he does miss doses on the weekends.  He is also on Metformin 1000mg  twice daily.  Alcohol consumption has increased this month.  He went to a couple of funerals and says he started drinking at the [place].  When asked today if he needs help with his alcohol consumption, he declines.   Review of Systems A comprehensive review of systems was negative except for: Genitourinary: positive for nocturia    Objective:    BP 154/78  Pulse 95  Temp(Src) 97.1 F (36.2 C) (Oral)  Resp 16  Ht 6\' 1"  (1.854 m)  Wt 268 lb 6.4 oz (121.745 kg)  BMI 35.42 kg/m2  General:  alert, cooperative, appears stated age and mildly obese  Oropharynx: normal findings: lips normal without lesions   Eyes:  negative   Ears:  normal TM's and external ear canals both ears        Lung: clear to auscultation bilaterally  Heart:  regular rate and rhythm, S1, S2 normal, no murmur, click, rub or gallop     Extremities: extremities normal, atraumatic, no cyanosis or edema  Skin: warm and dry, no hyperpigmentation, vitiligo, or suspicious lesions         Lab Review No results found for this basename: GLUCOSE, SODIUM, POTASSIUM, CHLORIDE, CO2, BUN, CREATININE   Labs drawn today:  A1C, CMP, microalbumin    Assessment:    Diabetes Mellitus type II, under fair control. He appears to be making efforts to improve  HTN goal is BP <140/80   Plan:    1.  Rx  changes: none 2.  Continue Humalog Mix 75/25 50 units twice daily. 3.  Continue Metformin 1000mg  twice daily. 4.  Counseled on nutrition goals.  Praised improved efforts. 5.  Praised improved exercise efforts.  Goal is 30-45 minutes 5 x week. 6.  Needs to self-monitor blood glucose at least twice a week. 7.  Offered support on alcohol use.  He declined.  Counseled on the problems of chronic alcohol abuse. 8.  HTN - BP above goal today.  His BP is unstable on 4 medications (including a diuretic).  He is often not compliant with all medications.  Will continue Chlorthalidone, Tekturna, Benazepril, and Amlodipine and monitor.

## 2012-05-20 LAB — HEMOGLOBIN A1C
Est. average glucose Bld gHb Est-mCnc: 197 mg/dL
Hgb A1c MFr Bld: 8.5 % — ABNORMAL HIGH (ref 4.8–5.6)

## 2012-05-20 LAB — COMPREHENSIVE METABOLIC PANEL
ALT: 49 IU/L — ABNORMAL HIGH (ref 0–44)
AST: 38 IU/L (ref 0–40)
Albumin/Globulin Ratio: 1.8 (ref 1.1–2.5)
Albumin: 4.2 g/dL (ref 3.6–4.8)
Alkaline Phosphatase: 60 IU/L (ref 39–117)
BUN/Creatinine Ratio: 13 (ref 10–22)
BUN: 21 mg/dL (ref 8–27)
CO2: 21 mmol/L (ref 19–28)
Calcium: 9.7 mg/dL (ref 8.6–10.2)
Chloride: 103 mmol/L (ref 97–108)
Creatinine, Ser: 1.67 mg/dL — ABNORMAL HIGH (ref 0.76–1.27)
GFR calc Af Amer: 49 mL/min/{1.73_m2} — ABNORMAL LOW (ref >59)
GFR calc non Af Amer: 43 mL/min/{1.73_m2} — ABNORMAL LOW (ref >59)
Globulin, Total: 2.3 g/dL (ref 1.5–4.5)
Glucose: 50 mg/dL — ABNORMAL LOW (ref 65–99)
Potassium: 4.2 mmol/L (ref 3.5–5.2)
Sodium: 140 mmol/L (ref 134–144)
Total Bilirubin: 0.3 mg/dL (ref 0.0–1.2)
Total Protein: 6.5 g/dL (ref 6.0–8.5)

## 2012-05-20 LAB — MICROALBUMIN / CREATININE URINE RATIO
Creatinine, Ur: 236.3 mg/dL (ref 22.0–328.0)
MICROALB/CREAT RATIO: 233.8 mg/g creat — ABNORMAL HIGH (ref 0.0–30.0)
Microalbumin, Urine: 552.4 ug/mL — ABNORMAL HIGH (ref 0.0–17.0)

## 2012-06-02 ENCOUNTER — Other Ambulatory Visit: Payer: Federal, State, Local not specified - PPO

## 2012-06-04 ENCOUNTER — Encounter: Payer: Self-pay | Admitting: Internal Medicine

## 2012-06-04 ENCOUNTER — Ambulatory Visit (INDEPENDENT_AMBULATORY_CARE_PROVIDER_SITE_OTHER): Payer: Federal, State, Local not specified - PPO | Admitting: Internal Medicine

## 2012-06-04 VITALS — BP 142/80 | HR 88 | Temp 97.9°F | Resp 20 | Ht 73.0 in | Wt 276.0 lb

## 2012-06-04 DIAGNOSIS — J45909 Unspecified asthma, uncomplicated: Secondary | ICD-10-CM

## 2012-06-04 DIAGNOSIS — E1329 Other specified diabetes mellitus with other diabetic kidney complication: Secondary | ICD-10-CM

## 2012-06-04 DIAGNOSIS — E0829 Diabetes mellitus due to underlying condition with other diabetic kidney complication: Secondary | ICD-10-CM

## 2012-06-04 DIAGNOSIS — E785 Hyperlipidemia, unspecified: Secondary | ICD-10-CM

## 2012-06-04 DIAGNOSIS — N189 Chronic kidney disease, unspecified: Secondary | ICD-10-CM

## 2012-06-04 HISTORY — DX: Unspecified asthma, uncomplicated: J45.909

## 2012-06-04 MED ORDER — FLUTICASONE-SALMETEROL 100-50 MCG/DOSE IN AEPB: 2.0000 | INHALATION_SPRAY | Freq: Two times a day (BID) | RESPIRATORY_TRACT | Status: DC

## 2012-06-04 NOTE — Patient Instructions (Signed)
Bring your glucometer and blood pressure machine to office next visit  Continue with exercise as you are right now

## 2012-06-04 NOTE — Progress Notes (Signed)
  Subjective:    Patient ID: Henry Carroll, male    DOB: 09-01-1950, 62 y.o.   MRN: [id number]  HPI Patient here for his routine follow up visit. He was seeing dr Dellia Nims before. He has been compliant with his medication. His cbg reading are between 80-200s in home. His breathing has improved. Reviewed his lab result and provided a copy for patient's record He denies any complaints Has been exercising on regular basis  Review of Systems  Constitutional: Negative for fever, chills and appetite change.  HENT: Negative for mouth sores and sinus pressure.   Respiratory: Negative for cough, shortness of breath and wheezing.   Cardiovascular: Negative for chest pain, palpitations and leg swelling.  Gastrointestinal: Negative for abdominal pain and constipation.  Genitourinary: Negative for dysuria.  Musculoskeletal: Positive for arthralgias.  Neurological: Negative for dizziness and light-headedness.  Hematological: Negative for adenopathy.  Psychiatric/Behavioral: Negative for suicidal ideas and behavioral problems.       Objective:   Physical Exam  Vitals reviewed. Constitutional: He is oriented to person, place, and time. He appears well-developed and well-nourished. No distress.  HENT:  Head: Normocephalic and atraumatic.  Mouth/Throat: Oropharynx is clear and moist.  Eyes: Pupils are equal, round, and reactive to light.  Neck: Normal range of motion. Neck supple.  Cardiovascular: Normal rate and regular rhythm.   Pulmonary/Chest: Effort normal and breath sounds normal.  Abdominal: Soft. Bowel sounds are normal.  Musculoskeletal: Normal range of motion. He exhibits no edema.  Lymphadenopathy:    He has no cervical adenopathy.  Neurological: He is alert and oriented to person, place, and time.  Skin: Skin is warm and dry. He is not diaphoretic.  Psychiatric: He has a normal mood and affect. His behavior is normal.    BP 142/80  Pulse 88  Temp(Src) 97.9 F (36.6 C) (Oral)   Resp 20  Ht 6\' 1"  (1.854 m)  Wt 276 lb (125.193 kg)  BMI 36.42 kg/m2  SpO2 95%     Assessment & Plan:   HTN- bp elevate don arrival and repeat check shows normal bp read. Continue current regimen of 4 bp meds and asked to bring home bp monitor next visit. Explained warning signs with elevated bp readings  DM with nephtropathy- reviewed labs. a1c has improved showing better sugar control. Has microalbuminuria. On ACEI. Continue metformin and humalog for now, no dose chnages. Continue asa. Not on statin. Check flp prior to next visit with a1c and bmp  Asthma- stable on current bronchodilator treatment  ckd- in setting of htn and dm, encouraged to continue exercise. Monitor rena function. To keep bp ad sugar under control

## 2012-06-30 ENCOUNTER — Ambulatory Visit: Payer: Federal, State, Local not specified - PPO | Admitting: Pharmacotherapy

## 2012-08-25 ENCOUNTER — Other Ambulatory Visit: Payer: Self-pay | Admitting: Pharmacotherapy

## 2012-08-26 ENCOUNTER — Other Ambulatory Visit (HOSPITAL_BASED_OUTPATIENT_CLINIC_OR_DEPARTMENT_OTHER): Payer: Self-pay | Admitting: Internal Medicine

## 2012-09-01 ENCOUNTER — Other Ambulatory Visit: Payer: Federal, State, Local not specified - PPO

## 2012-09-03 ENCOUNTER — Encounter: Payer: Self-pay | Admitting: Internal Medicine

## 2012-09-03 ENCOUNTER — Ambulatory Visit (INDEPENDENT_AMBULATORY_CARE_PROVIDER_SITE_OTHER): Payer: Federal, State, Local not specified - PPO | Admitting: Internal Medicine

## 2012-09-03 VITALS — BP 146/82 | HR 62 | Temp 96.8°F | Resp 14 | Ht 73.0 in | Wt 264.8 lb

## 2012-09-03 DIAGNOSIS — E785 Hyperlipidemia, unspecified: Secondary | ICD-10-CM

## 2012-09-03 DIAGNOSIS — N182 Chronic kidney disease, stage 2 (mild): Secondary | ICD-10-CM

## 2012-09-03 DIAGNOSIS — D631 Anemia in chronic kidney disease: Secondary | ICD-10-CM

## 2012-09-03 DIAGNOSIS — N189 Chronic kidney disease, unspecified: Secondary | ICD-10-CM

## 2012-09-03 DIAGNOSIS — E1329 Other specified diabetes mellitus with other diabetic kidney complication: Secondary | ICD-10-CM

## 2012-09-03 DIAGNOSIS — R5383 Other fatigue: Secondary | ICD-10-CM

## 2012-09-03 DIAGNOSIS — R5381 Other malaise: Secondary | ICD-10-CM | POA: Insufficient documentation

## 2012-09-03 DIAGNOSIS — E291 Testicular hypofunction: Secondary | ICD-10-CM

## 2012-09-03 DIAGNOSIS — E0829 Diabetes mellitus due to underlying condition with other diabetic kidney complication: Secondary | ICD-10-CM

## 2012-09-03 HISTORY — DX: Testicular hypofunction: E29.1

## 2012-09-03 HISTORY — DX: Anemia in chronic kidney disease: D63.1

## 2012-09-03 HISTORY — DX: Anemia in chronic kidney disease: N18.9

## 2012-09-03 HISTORY — DX: Other malaise: R53.81

## 2012-09-03 MED ORDER — HYDRALAZINE HCL 25 MG PO TABS: 25.0000 mg | ORAL_TABLET | Freq: Two times a day (BID) | ORAL | Status: DC

## 2012-09-03 NOTE — Progress Notes (Signed)
Patient ID: Henry Carroll, male   DOB: 05/29/1950, 62 y.o.   MRN: [id number]  Chief Complaint  Patient presents with  . Medical Managment of Chronic Issues   No Known Allergies  HPI Patient here for his routine follow up visit. He has been compliant with his medication. His cbg reading are between 90-200s in home. His breathing has improved. Reviewed his lab result and provided a copy for patient's record He has been getting tired easily He denies any other complaints Has been exercising on regular basis He used to be on testosterone gel before but has not been on it since February  Review of Systems  Constitutional: Negative for fever, chills and appetite change.  HENT: Negative for mouth sores and sinus pressure.   Respiratory: Negative for cough, shortness of breath and wheezing.   Cardiovascular: Negative for chest pain, palpitations and leg swelling.  Gastrointestinal: Negative for abdominal pain and constipation.  Genitourinary: Negative for dysuria.  Musculoskeletal: Positive for arthralgias.  Neurological: Negative for dizziness and light-headedness.  Hematological: Negative for adenopathy.  Psychiatric/Behavioral: Negative for suicidal ideas and behavioral problems.   BP 146/82  Pulse 62  Temp(Src) 96.8 F (36 C) (Oral)  Resp 14  Ht 6\' 1"  (1.854 m)  Wt 264 lb 12.8 oz (120.112 kg)  BMI 34.94 kg/m2  Constitutional: He is oriented to person, place, and time. He appears well-developed and well-nourished. No distress.  HENT:   Head: Normocephalic and atraumatic.   Mouth/Throat: Oropharynx is clear and moist.  Eyes: Pupils are equal, round, and reactive to light.  Neck: Normal range of motion. Neck supple.  Cardiovascular: Normal rate and regular rhythm.   Pulmonary/Chest: Effort normal and breath sounds normal.  Abdominal: Soft. Bowel sounds are normal.  Musculoskeletal: Normal range of motion. He exhibits no edema.  Lymphadenopathy:    He has no cervical  adenopathy.  Neurological: He is alert and oriented to person, place, and time.  Skin: Skin is warm and dry. He is not diaphoretic.  Psychiatric: He has a normal mood and affect. His behavior is normal.   Labs- 09/01/12 Wbc 5.9, hb 11.1, hct 33.4, plt 268, glu 40, bun 21, cr 1.39, gfr 61/min, na 144, k 4.3, ca 9.5, t.chol 166, tg 133, hdl 46, ldl 93, a1c 8.2  ASSESSMENT/PLAN  HTN- bp elevated this visit and home bp has been elevated as well. Will add hydralazine 25 mg bid to current regimen of medications and reassess in 6 weeks. If no improvement, will increase hydralazine dose further  Fatigue- has anemia. Will check erythropoetin level to rule out anemia from CKD. 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 a1c has improved showing better sugar control. Has microalbuminuria. On ACEI. Continue metformin and humalog for now, no dose chnages. Continue asa. LDL at goal  Asthma- stable on current bronchodilator treatment  ckd- in setting of htn and dm, encouraged to continue exercise. Monitor renal function. To keep bp and sugar under control  Testicular hypofunction- check testosterone level to assess for further need for gel.   Labs- erythropoetin level, testosterone levlel

## 2012-09-15 ENCOUNTER — Encounter: Payer: Self-pay | Admitting: Internal Medicine

## 2012-09-16 ENCOUNTER — Encounter: Payer: Self-pay | Admitting: Internal Medicine

## 2012-10-01 ENCOUNTER — Ambulatory Visit (INDEPENDENT_AMBULATORY_CARE_PROVIDER_SITE_OTHER): Payer: Federal, State, Local not specified - PPO | Admitting: Internal Medicine

## 2012-10-01 ENCOUNTER — Encounter: Payer: Self-pay | Admitting: Internal Medicine

## 2012-10-01 ENCOUNTER — Other Ambulatory Visit: Payer: Self-pay | Admitting: Internal Medicine

## 2012-10-01 VITALS — BP 170/86 | HR 91 | Temp 98.1°F | Resp 14 | Ht 73.0 in | Wt 260.6 lb

## 2012-10-01 DIAGNOSIS — E0829 Diabetes mellitus due to underlying condition with other diabetic kidney complication: Secondary | ICD-10-CM

## 2012-10-01 DIAGNOSIS — J45909 Unspecified asthma, uncomplicated: Secondary | ICD-10-CM

## 2012-10-01 DIAGNOSIS — L739 Follicular disorder, unspecified: Secondary | ICD-10-CM | POA: Insufficient documentation

## 2012-10-01 DIAGNOSIS — I10 Essential (primary) hypertension: Secondary | ICD-10-CM

## 2012-10-01 DIAGNOSIS — L738 Other specified follicular disorders: Secondary | ICD-10-CM

## 2012-10-01 DIAGNOSIS — J069 Acute upper respiratory infection, unspecified: Secondary | ICD-10-CM

## 2012-10-01 DIAGNOSIS — E1329 Other specified diabetes mellitus with other diabetic kidney complication: Secondary | ICD-10-CM

## 2012-10-01 HISTORY — DX: Follicular disorder, unspecified: L73.9

## 2012-10-01 MED ORDER — LEVOFLOXACIN 500 MG PO TABS: 500.0000 mg | ORAL_TABLET | Freq: Every day | ORAL | Status: DC

## 2012-10-01 NOTE — Progress Notes (Signed)
Patient ID: Henry Carroll, male   DOB: 10-29-1950, 63 y.o.   MRN: [id number]  Chief Complaint  Patient presents with  . Cough  . Sore on Bottom   No Known Allergies  HPI 62 Y/O male patient is here with complaints of a bump in his behind for a week. It started as a size of a quarter and remains unchaged but is painful and throbbing in nature. Denies any drainage. Tylenol helps some with pain He has also had cough with productive phlegm and wheezing for past 4 days No fever or chills He has ran out of his inhaler and was not able to pick up his refills as he was not feeling well cbg this am was 56 cbg average between 150-180 One reading of 230 last week Compliant with his meds Has not taken his bp medication today and was rushing to his appointment  Review of Systems  Constitutional: Negative for malaise/fatigue and diaphoresis.  HENT: Positive for congestion.   Cardiovascular: Negative for chest pain and palpitations.  Gastrointestinal: Negative for heartburn, nausea and vomiting.  Neurological: Positive for weakness. Negative for headaches.    BP 170/86  Pulse 91  Temp(Src) 98.1 F (36.7 C) (Oral)  Resp 14  Ht 6\' 1"  (1.854 m)  Wt 260 lb 9.6 oz (118.207 kg)  BMI 34.39 kg/m2  SpO2 98%  Constitutional: He is oriented to person, place, and time. He appears well-developed and well-nourished. No distress.   HENT:   Head: Normocephalic and atraumatic.  no sinus tenderness Mouth/Throat: Oropharyngeal erythema, moist   Neck: Normal range of motion. Neck supple.   Cardiovascular: Normal rate and regular rhythm.    Pulmonary/Chest: Effort normal, has wheezing and rhonchi.  Abdominal: Soft. Bowel sounds are normal.  Musculoskeletal: Normal range of motion. He exhibits no edema.  Lymphadenopathy:    He has no cervical adenopathy.  Neurological: He is alert and oriented to person, place, and time.   Skin: Skin is warm and dry. He is not diaphoretic. Has a quarter sized mass  around a hair follicle in left perineal area. Tender to touch and mild erythema present. No drainage Psychiatric: He has a normal mood and affect. His behavior is normal.   Assessment/plan  Folliculitis- no Incision and drainage needed. Will have him on levofloxacin 500 mg daily for 5 days with warm compresses and prn tylenol for now  Samuel [place]- has upper respiratory tract infection and given his hx of asthma, has mild asthma exacerbation. Will need to start taking his breathing treatment. levaquin should cover for URI/ exacerbation of asthma as well  Dm- under control. Continue current regimen of metformin and humalog, monitor cbg  HTN- med compliance reinforced. No symptoms present currently. monitor

## 2012-10-15 ENCOUNTER — Encounter: Payer: Self-pay | Admitting: Internal Medicine

## 2012-10-15 ENCOUNTER — Ambulatory Visit (INDEPENDENT_AMBULATORY_CARE_PROVIDER_SITE_OTHER): Payer: Federal, State, Local not specified - PPO | Admitting: Internal Medicine

## 2012-10-15 VITALS — BP 152/68 | HR 95 | Temp 98.4°F | Resp 18 | Ht 73.0 in | Wt 269.8 lb

## 2012-10-15 DIAGNOSIS — J45909 Unspecified asthma, uncomplicated: Secondary | ICD-10-CM

## 2012-10-15 DIAGNOSIS — E1329 Other specified diabetes mellitus with other diabetic kidney complication: Secondary | ICD-10-CM

## 2012-10-15 DIAGNOSIS — E0829 Diabetes mellitus due to underlying condition with other diabetic kidney complication: Secondary | ICD-10-CM

## 2012-10-15 DIAGNOSIS — Z23 Encounter for immunization: Secondary | ICD-10-CM

## 2012-10-15 DIAGNOSIS — I10 Essential (primary) hypertension: Secondary | ICD-10-CM

## 2012-10-15 MED ORDER — HYDRALAZINE HCL 25 MG PO TABS: 25.0000 mg | ORAL_TABLET | Freq: Three times a day (TID) | ORAL | Status: DC

## 2012-10-15 MED ORDER — INSULIN LISPRO PROT & LISPRO (75-25 MIX) 100 UNIT/ML ~~LOC~~ SUSP: 50.0000 [IU] | Freq: Two times a day (BID) | SUBCUTANEOUS | Status: DC

## 2012-10-15 NOTE — Progress Notes (Signed)
Patient ID: Henry Carroll, male   DOB: 19-Apr-1950, 62 y.o.   MRN: [id number]  Chief Complaint  Patient presents with  . Follow-up    No Known Allergies  HPI 62 Y/O male patient is here for follow up. He feels his bump to be resolving. No further drainage in the area. Denies any pain or discomfort.  Cough has resolved He has been checking his bp at home SBP below 130-140 and DBP 60-70 His cbg this am was 187. 96 in am yesterday and 121 in pm Highest was 298 Has been exercising on regular basis He ran out of his hydralazine on Monday  Review of Systems   Constitutional: Negative for fever, chills and appetite change.   HENT: Negative for mouth sores and sinus pressure.    Respiratory: Negative for cough, shortness of breath, congestion and wheezing.    Cardiovascular: Negative for chest pain, palpitations and leg swelling.   Gastrointestinal: Negative for abdominal pain and constipation.   Genitourinary: Negative for dysuria.   Musculoskeletal: Positive for arthralgias.   Neurological: Negative for dizziness and light-headedness.   Hematological: Negative for adenopathy.   Psychiatric/Behavioral: Negative for suicidal ideas and behavioral problems.   No Known Allergies  Past Medical History  Diagnosis Date  . Diabetes mellitus   . Allergy   . Asthma   . Hypertension   . Anemia   . Anxiety   . Hyperlipidemia   . Problems with sexual function    Current Outpatient Prescriptions on File Prior to Visit  Medication Sig Dispense Refill  . albuterol (PROVENTIL) (2.5 MG/3ML) 0.083% nebulizer solution USE 1 INHALATION SOLUTION PER NEBULIZER EVERY 6-8 HOURS  3 mL  1  . amLODipine (NORVASC) 10 MG tablet Take 10 mg by mouth daily.       Marland Kitchen aspirin EC 81 MG tablet Take 81 mg by mouth daily.      . benazepril (LOTENSIN) 40 MG tablet TAKE ONE TABLET BY MOUTH EVERY DAY  90 tablet  5  . chlorthalidone (HYGROTON) 25 MG tablet Take 50 mg by mouth daily.       . Coenzyme Q10 (CO Q 10  PO) Take 1 tablet by mouth daily.      . Fluticasone-Salmeterol (ADVAIR) 100-50 MCG/DOSE AEPB Inhale 2 puffs into the lungs 2 (two) times daily.  1 each  3  . hydrALAZINE (APRESOLINE) 25 MG tablet Take 1 tablet (25 mg total) by mouth 2 (two) times daily.  60 tablet  3  . metFORMIN (GLUCOPHAGE) 500 MG tablet Take 500 mg by mouth 2 (two) times daily. 2 tablet twice a day      . Multiple Vitamin (MULTIVITAMIN WITH MINERALS) TABS Take 1 tablet by mouth daily.      Marland Kitchen PROAIR HFA 108 (90 BASE) MCG/ACT inhaler INHALE 2 PUFFS BY MOUTH ONCE DAILY  9 each  2  . terbinafine (LAMISIL) 1 % cream Apply 1 application topically daily as needed.      . Aliskiren Fumarate (TEKTURNA PO) Take 1 tablet by mouth daily.      . insulin lispro protamine-insulin lispro (HUMALOG 75/25) (75-25) 100 UNIT/ML SUSP Inject 50 Units into the skin 2 (two) times daily.       Marland Kitchen levofloxacin (LEVAQUIN) 500 MG tablet Take 1 tablet (500 mg total) by mouth daily.  5 tablet  0   No current facility-administered medications on file prior to visit.    Physical exam  BP 152/68  Pulse 95  Temp(Src) 98.4 F (36.9  C) (Oral)  Resp 18  Ht 6\' 1"  (1.854 m)  Wt 269 lb 12.8 oz (122.38 kg)  BMI 35.6 kg/m2  SpO2 96%  Constitutional: He is oriented to person, place, and time. He appears well-developed and well-nourished. No distress.   HENT:   Head: Normocephalic and atraumatic.  no sinus tenderness Mouth/Throat: Oropharyngeal erythema, moist    Neck: Normal range of motion. Neck supple.   Cardiovascular: Normal rate and regular rhythm.    Pulmonary/Chest: Effort normal, has wheezing and rhonchi.   Abdominal: Soft. Bowel sounds are normal.  Musculoskeletal: Normal range of motion. He exhibits no edema.  Lymphadenopathy:    He has no cervical adenopathy.  Neurological: He is alert and oriented to person, place, and time.   Skin: Skin is warm and dry. He is not diaphoretic. Folliculitis resolvedPsychiatric: He has a normal mood and  affect. His behavior is normal.    Labs reviewed 09/01/12 Wbc 5.9, hb 11.1, hct 33.4, plt 268, glu 40, bun 21, cr 1.39, gfr 61/min, na 144, k 4.3, ca 9.5, t.chol 166, tg 133, hdl 46, ldl 93, a1c 8.2   ASSESSMENT/PLAN  HTN- bp elevated this visit. Will change hydralazine to 25 mg tid for now. Continue other - amlodipine, benazepril, chlorthalidone and tekturna. Reassess if no improvement. Will need to rule out secondary causes then   DM with nephtropathy- reviewed labs. a1c has improved showing better sugar control. Has microalbuminuria. On ACEI. Continue metformin and humalog for now, no dose changes. Continue asa. LDL at goal. Continue asa and coenzyme. Dietary modification and exercise encouraged  Asthma- stable on current bronchodilator treatment  Labs- a1c, bmp prior to next visit  Influenza vaccine provided

## 2012-11-04 ENCOUNTER — Other Ambulatory Visit: Payer: Self-pay | Admitting: Pharmacotherapy

## 2012-11-04 ENCOUNTER — Other Ambulatory Visit: Payer: Self-pay | Admitting: Internal Medicine

## 2012-11-28 ENCOUNTER — Other Ambulatory Visit: Payer: Self-pay | Admitting: *Deleted

## 2012-11-28 MED ORDER — INSULIN LISPRO PROT & LISPRO (75-25 MIX) 100 UNIT/ML ~~LOC~~ SUSP: 50.0000 [IU] | Freq: Two times a day (BID) | SUBCUTANEOUS | Status: DC

## 2012-12-03 ENCOUNTER — Other Ambulatory Visit: Payer: Self-pay | Admitting: *Deleted

## 2012-12-03 DIAGNOSIS — J45909 Unspecified asthma, uncomplicated: Secondary | ICD-10-CM

## 2012-12-03 MED ORDER — ALISKIREN FUMARATE 300 MG PO TABS: ORAL_TABLET | ORAL | Status: DC

## 2012-12-03 MED ORDER — FLUTICASONE-SALMETEROL 100-50 MCG/DOSE IN AEPB: 2.0000 | INHALATION_SPRAY | Freq: Two times a day (BID) | RESPIRATORY_TRACT | Status: DC

## 2012-12-03 MED ORDER — ALBUTEROL SULFATE HFA 108 (90 BASE) MCG/ACT IN AERS: INHALATION_SPRAY | RESPIRATORY_TRACT | Status: DC

## 2012-12-03 MED ORDER — METFORMIN HCL 500 MG PO TABS: ORAL_TABLET | ORAL | Status: DC

## 2012-12-03 MED ORDER — AMLODIPINE BESYLATE 10 MG PO TABS: 10.0000 mg | ORAL_TABLET | Freq: Every day | ORAL | Status: DC

## 2012-12-03 MED ORDER — BENAZEPRIL HCL 40 MG PO TABS: ORAL_TABLET | ORAL | Status: DC

## 2012-12-03 MED ORDER — CHLORTHALIDONE 25 MG PO TABS: 50.0000 mg | ORAL_TABLET | Freq: Every day | ORAL | Status: DC

## 2012-12-03 MED ORDER — AMBULATORY NON FORMULARY MEDICATION: Status: DC

## 2012-12-09 ENCOUNTER — Other Ambulatory Visit: Payer: Self-pay | Admitting: *Deleted

## 2012-12-09 ENCOUNTER — Telehealth: Payer: Self-pay

## 2012-12-09 ENCOUNTER — Other Ambulatory Visit: Payer: Self-pay | Admitting: Internal Medicine

## 2012-12-09 MED ORDER — ALBUTEROL SULFATE HFA 108 (90 BASE) MCG/ACT IN AERS: INHALATION_SPRAY | RESPIRATORY_TRACT | Status: DC

## 2012-12-09 MED ORDER — ALISKIREN FUMARATE 300 MG PO TABS: ORAL_TABLET | ORAL | Status: DC

## 2012-12-09 NOTE — Telephone Encounter (Signed)
Per Dr.Pandey  Patient is on both tekturna and benazepril. We will need to take him off tekturna. Will have him take half a pill of tekturna which is equal to 150 mg daily for a week and then stop tekturna. Monitor bp on daily basis and bring reading to the office on upcoming visit. Notify if readings are persistently above 140/90  Called patient on home phone, no answer. Left message on voicemail for patient to return call when available. Reason for call: Discuss above concerns

## 2012-12-30 ENCOUNTER — Other Ambulatory Visit: Payer: Self-pay | Admitting: Internal Medicine

## 2013-01-02 NOTE — Telephone Encounter (Signed)
Discussed with patients wife, verbalized understanding

## 2013-01-06 ENCOUNTER — Other Ambulatory Visit: Payer: Federal, State, Local not specified - PPO

## 2013-01-06 DIAGNOSIS — E0829 Diabetes mellitus due to underlying condition with other diabetic kidney complication: Secondary | ICD-10-CM

## 2013-01-06 DIAGNOSIS — I10 Essential (primary) hypertension: Secondary | ICD-10-CM

## 2013-01-07 LAB — BASIC METABOLIC PANEL
BUN/Creatinine Ratio: 18 (ref 10–22)
BUN: 26 mg/dL (ref 8–27)
CO2: 25 mmol/L (ref 18–29)
Calcium: 9.6 mg/dL (ref 8.6–10.2)
Chloride: 99 mmol/L (ref 97–108)
Creatinine, Ser: 1.45 mg/dL — ABNORMAL HIGH (ref 0.76–1.27)
GFR calc non Af Amer: 51 mL/min/{1.73_m2} — ABNORMAL LOW (ref >59)
Sodium: 138 mmol/L (ref 134–144)

## 2013-01-07 LAB — HEMOGLOBIN A1C: Est. average glucose Bld gHb Est-mCnc: 209 mg/dL

## 2013-01-14 ENCOUNTER — Ambulatory Visit (INDEPENDENT_AMBULATORY_CARE_PROVIDER_SITE_OTHER): Payer: Federal, State, Local not specified - PPO | Admitting: Internal Medicine

## 2013-01-14 ENCOUNTER — Encounter: Payer: Self-pay | Admitting: Internal Medicine

## 2013-01-14 VITALS — BP 158/76 | HR 87 | Wt 269.0 lb

## 2013-01-14 DIAGNOSIS — I129 Hypertensive chronic kidney disease with stage 1 through stage 4 chronic kidney disease, or unspecified chronic kidney disease: Secondary | ICD-10-CM

## 2013-01-14 DIAGNOSIS — J441 Chronic obstructive pulmonary disease with (acute) exacerbation: Secondary | ICD-10-CM

## 2013-01-14 DIAGNOSIS — N19 Unspecified kidney failure: Secondary | ICD-10-CM

## 2013-01-14 DIAGNOSIS — E1129 Type 2 diabetes mellitus with other diabetic kidney complication: Secondary | ICD-10-CM

## 2013-01-14 DIAGNOSIS — E1121 Type 2 diabetes mellitus with diabetic nephropathy: Secondary | ICD-10-CM | POA: Insufficient documentation

## 2013-01-14 DIAGNOSIS — N058 Unspecified nephritic syndrome with other morphologic changes: Secondary | ICD-10-CM

## 2013-01-14 DIAGNOSIS — N189 Chronic kidney disease, unspecified: Secondary | ICD-10-CM

## 2013-01-14 MED ORDER — PREDNISONE 50 MG PO TABS: ORAL_TABLET | ORAL | Status: DC

## 2013-01-14 MED ORDER — HYDRALAZINE HCL 25 MG PO TABS: 25.0000 mg | ORAL_TABLET | Freq: Two times a day (BID) | ORAL | Status: DC

## 2013-01-14 MED ORDER — ALBUTEROL SULFATE (2.5 MG/3ML) 0.083% IN NEBU: INHALATION_SOLUTION | RESPIRATORY_TRACT | Status: DC

## 2013-01-14 MED ORDER — AZITHROMYCIN 250 MG PO TABS: ORAL_TABLET | ORAL | Status: DC

## 2013-01-14 MED ORDER — SYRINGE (DISPOSABLE) 3 ML MISC: Status: DC

## 2013-01-14 MED ORDER — TIOTROPIUM BROMIDE MONOHYDRATE 18 MCG IN CAPS: 18.0000 ug | ORAL_CAPSULE | Freq: Every day | RESPIRATORY_TRACT | Status: DC

## 2013-01-14 MED ORDER — CHLORTHALIDONE 25 MG PO TABS: 50.0000 mg | ORAL_TABLET | Freq: Every day | ORAL | Status: DC

## 2013-01-14 NOTE — Progress Notes (Signed)
Patient ID: Henry Carroll, male   DOB: [birth year], 62 y.o.   MRN: [id number]  Chief Complaint  Patient presents with  . Medical Managment of Chronic Issues    Follow-up on DM and HTN   . URI    x 3 weeks patient c/o dry cough and congestion    No Known Allergies  HPI 62 y/o male patient with copd, dm, htn, obesity here for routine follow up He has been having cough- initially dry and now has some phlegm with clear mucus. 3 weeks back he had chest congestion and URI symptoms which he feels persists.  No sore throat or runny nose. Denies any wheezing. Denies smoking. Is taking proventil prn and advair bid and has hx of copd He is off tekturna. Reading at home had bp 131/73, 117/78. Taking amlodipine, benazepril, chlorthalidone and is out of Hydralazine for 1 month. sbp elevated in office today. Repeat bp check elevated Reviewed his dm medication, compliant with humalog bid and metformin. cbg this am 100. cbg ranging between 100-165.   Lowest is 65 and highest is 299 He denies any other complaints Has been exercising on regular basis by playing golf  Review of Systems   Constitutional: Negative for fever, chills and appetite change.   HENT: Negative for mouth sores and sinus pressure.    Respiratory: Negative for shortness of breath and wheezing.    Cardiovascular: Negative for chest pain, palpitations and leg swelling.   Gastrointestinal: Negative for abdominal pain and constipation.   Genitourinary: Negative for dysuria.   Musculoskeletal: Positive for arthralgias.   Neurological: Negative for dizziness and light-headedness.   Hematological: Negative for adenopathy.   Psychiatric/Behavioral: Negative for suicidal ideas and behavioral problems.   Physical exam BP 158/76  Pulse 87  Wt 269 lb (122.018 kg)  SpO2 97%  Constitutional: He is oriented to person, place, and time. He appears well-developed and well-nourished. No distress.  obese HENT:   Head: Normocephalic and  atraumatic.   Mouth/Throat: Oropharynx is clear and moist.   Eyes: Pupils are equal, round, and reactive to light.   Neck: Normal range of motion. Neck supple.   Cardiovascular: Normal rate and regular rhythm.    Pulmonary/Chest: Effort normal but has scattered wheezing. Decreased air entry Abdominal: Soft. Bowel sounds are normal.  Musculoskeletal: Normal range of motion. He exhibits no edema.  Lymphadenopathy:    He has no cervical adenopathy.  Neurological: He is alert and oriented to person, place, and time.   Skin: Skin is warm and dry. He is not diaphoretic.  Psychiatric: He has a normal mood and affect. His behavior is normal  Labs- CBC    Component Value Date/Time   WBC 6.4 01/18/2012 1257   RBC 4.49* 01/18/2012 1257   HGB 13.5* 01/18/2012 1257   HCT 43.8 01/18/2012 1257   MCV 97.5* 01/18/2012 1257   MCH 30.1 01/18/2012 1257   MCHC 30.8* 01/18/2012 1257    Lab Results  Component Value Date   HGBA1C 8.9* 01/06/2013   CMP     Component Value Date/Time   NA 138 01/06/2013 1526   K 4.3 01/06/2013 1526   CL 99 01/06/2013 1526   CO2 25 01/06/2013 1526   GLUCOSE 112* 01/06/2013 1526   BUN 26 01/06/2013 1526   CREATININE 1.45* 01/06/2013 1526   CALCIUM 9.6 01/06/2013 1526   PROT 6.5 05/19/2012 1238   AST 38 05/19/2012 1238   ALT 49* 05/19/2012 1238   ALKPHOS 60 05/19/2012 1238  BILITOT 0.3 05/19/2012 1238   GFRNONAA 51* 01/06/2013 1526   GFRAA 58* 01/06/2013 1526    Assessment/plan  Copd exacerbation- with his worsening cough and wheezing and known copd, will have him on prednisone 50 mg daily for 5 days, zpack and continue his advair and proventil prn. Will also add spiriva after completion of the prednsione course. Reassess if no improvement  HTN- bp elevated this visit. Has missed his hydralazine for a month now. Will provide new script for hydralazine 25 mg bid and continue his amlodipine, benazepril, chlorthalidone  DM with nephropathy- reviewed labs. a1c reviewed. Has  microalbuminuria. On ACEI. Continue metformin and humalog for now, no dose chnages. Continue asa. LDL at goal. Continue asa

## 2013-02-11 ENCOUNTER — Other Ambulatory Visit: Payer: Self-pay | Admitting: Internal Medicine

## 2013-02-11 DIAGNOSIS — E119 Type 2 diabetes mellitus without complications: Secondary | ICD-10-CM

## 2013-02-18 ENCOUNTER — Encounter: Payer: Self-pay | Admitting: Internal Medicine

## 2013-02-18 ENCOUNTER — Ambulatory Visit (INDEPENDENT_AMBULATORY_CARE_PROVIDER_SITE_OTHER): Payer: Medicare Other | Admitting: Internal Medicine

## 2013-02-18 VITALS — BP 138/80 | HR 95 | Temp 97.2°F | Wt 270.0 lb

## 2013-02-18 DIAGNOSIS — I10 Essential (primary) hypertension: Secondary | ICD-10-CM

## 2013-02-18 DIAGNOSIS — I129 Hypertensive chronic kidney disease with stage 1 through stage 4 chronic kidney disease, or unspecified chronic kidney disease: Secondary | ICD-10-CM

## 2013-02-18 DIAGNOSIS — E785 Hyperlipidemia, unspecified: Secondary | ICD-10-CM

## 2013-02-18 DIAGNOSIS — N183 Chronic kidney disease, stage 3 unspecified: Secondary | ICD-10-CM | POA: Insufficient documentation

## 2013-02-18 DIAGNOSIS — N058 Unspecified nephritic syndrome with other morphologic changes: Secondary | ICD-10-CM

## 2013-02-18 DIAGNOSIS — E1129 Type 2 diabetes mellitus with other diabetic kidney complication: Secondary | ICD-10-CM | POA: Diagnosis not present

## 2013-02-18 DIAGNOSIS — N189 Chronic kidney disease, unspecified: Secondary | ICD-10-CM

## 2013-02-18 DIAGNOSIS — J449 Chronic obstructive pulmonary disease, unspecified: Secondary | ICD-10-CM | POA: Insufficient documentation

## 2013-02-18 DIAGNOSIS — N19 Unspecified kidney failure: Secondary | ICD-10-CM

## 2013-02-18 DIAGNOSIS — J45909 Unspecified asthma, uncomplicated: Secondary | ICD-10-CM

## 2013-02-18 DIAGNOSIS — E1121 Type 2 diabetes mellitus with diabetic nephropathy: Secondary | ICD-10-CM

## 2013-02-18 NOTE — Progress Notes (Signed)
Patient ID: Henry Carroll, male   DOB: 1950/05/10, 63 y.o.   MRN: [id number]    Chief Complaint  Patient presents with  . Medical Managment of Chronic Issues    1 month follow-up    No Known Allergies  HPI 63 y/o male patient with copd, dm, htn, obesity here for routine follow up His cough has resolved. He tolerated prednisone course well. He has required his nebulizer twice in last month  Reviewed his dm medication, compliant with humalog bid and metformin. cbg this am 127. cbg ranging between 100-150.     He denies any other complaints  Has been exercising on regular basis by playing golf  Review of Systems   Constitutional: Negative for fever, chills and appetite change.   HENT: Negative for mouth sores and sinus pressure.    Respiratory: Negative for shortness of breath and wheezing.    Cardiovascular: Negative for chest pain, palpitations and leg swelling.   Gastrointestinal: Negative for abdominal pain and constipation.   Genitourinary: Negative for dysuria.   Musculoskeletal: Positive for arthralgias.   Neurological: Negative for dizziness and light-headedness.   Hematological: Negative for adenopathy.   Psychiatric/Behavioral: Negative for suicidal ideas and behavioral problems.   Past Medical History  Diagnosis Date  . Diabetes mellitus   . Allergy   . Asthma   . Hypertension   . Anemia   . Anxiety   . Hyperlipidemia   . Problems with sexual function    Past Surgical History  Procedure Laterality Date  . Tonsillectomy  1962  . Hernia repair  0000000    Umbilical  . Spine surgery  2006    L4 & L5  . Colon surgery  2011    Colonoscopy   Medication reviewed. See [facility]  Physical exam BP 138/80  Pulse 95  Temp(Src) 97.2 F (36.2 C) (Oral)  Wt 270 lb (122.471 kg)  SpO2 94%  Constitutional: He is oriented to person, place, and time. He appears well-developed and well-nourished. No distress.  obese HENT:   Head: Normocephalic and atraumatic.     Mouth/Throat: Oropharynx is clear and moist.   Eyes: Pupils are equal, round, and reactive to light.   Neck: Normal range of motion. Neck supple.   Cardiovascular: Normal rate and regular rhythm.    Pulmonary/Chest: Effort normal but has scattered wheezing. Decreased air entry Abdominal: Soft. Bowel sounds are normal.  Musculoskeletal: Normal range of motion. He exhibits no edema.  Lymphadenopathy:    He has no cervical adenopathy.  Neurological: He is alert and oriented to person, place, and time.   Skin: Skin is warm and dry. He is not diaphoretic.  Psychiatric: He has a normal mood and affect. His behavior is normal  Lab- Lab Results  Component Value Date   HGBA1C 8.9* 01/06/2013    CBC    Component Value Date/Time   WBC 6.4 01/18/2012 1257   RBC 4.49* 01/18/2012 1257   HGB 13.5* 01/18/2012 1257   HCT 43.8 01/18/2012 1257   MCV 97.5* 01/18/2012 1257   MCH 30.1 01/18/2012 1257   MCHC 30.8* 01/18/2012 1257    CMP     Component Value Date/Time   NA 138 01/06/2013 1526   K 4.3 01/06/2013 1526   CL 99 01/06/2013 1526   CO2 25 01/06/2013 1526   GLUCOSE 112* 01/06/2013 1526   BUN 26 01/06/2013 1526   CREATININE 1.45* 01/06/2013 1526   CALCIUM 9.6 01/06/2013 1526   PROT 6.5 05/19/2012 1238  AST 38 05/19/2012 1238   ALT 49* 05/19/2012 1238   ALKPHOS 60 05/19/2012 1238   BILITOT 0.3 05/19/2012 1238   GFRNONAA 51* 01/06/2013 1526   GFRAA 58* 01/06/2013 1526   Assessment/plan  1. Benign essential HTN Normal bp. Continue hydralazine 25 mg bid, amlodipine 10 mg daily, benazepril 40 mg daily, chlorthalidone 50 mg daily  2. Hypertensive renal disease with renal failure Continue bp meds and monitor renal function - CMP; Future  3. Diabetes mellitus with nephropathy Continue metformin 1000 mg bid and lispro for now, monitor cbg - Hemoglobin A1c; Future - CBC with Differential; Future - CMP; Future - Microalbumin/Creatinine Ratio, Urine  4. Hyperlipidemia LDL goal < 100 Stable,  check lipid panel. Not on any statin at present - Lipid Panel; Future  5. CKD (chronic kidney disease) stage 3, GFR 30-59 ml/min Avoid NSAIDs. bp controlled. Will need to keep his sugar under control  6. Asthma, chronic Stable currently, continue spiriva, advair and prn albuterol

## 2013-04-18 ENCOUNTER — Other Ambulatory Visit: Payer: Self-pay | Admitting: Internal Medicine

## 2013-04-30 ENCOUNTER — Other Ambulatory Visit: Payer: Federal, State, Local not specified - PPO

## 2013-05-04 ENCOUNTER — Other Ambulatory Visit: Payer: Medicare Other

## 2013-05-04 DIAGNOSIS — E785 Hyperlipidemia, unspecified: Secondary | ICD-10-CM

## 2013-05-04 DIAGNOSIS — I129 Hypertensive chronic kidney disease with stage 1 through stage 4 chronic kidney disease, or unspecified chronic kidney disease: Secondary | ICD-10-CM

## 2013-05-04 DIAGNOSIS — E1121 Type 2 diabetes mellitus with diabetic nephropathy: Secondary | ICD-10-CM

## 2013-05-05 ENCOUNTER — Ambulatory Visit (INDEPENDENT_AMBULATORY_CARE_PROVIDER_SITE_OTHER): Payer: Medicare Other | Admitting: Internal Medicine

## 2013-05-05 ENCOUNTER — Encounter: Payer: Self-pay | Admitting: Internal Medicine

## 2013-05-05 VITALS — BP 146/70 | HR 88 | Temp 97.9°F | Resp 16 | Ht 72.75 in | Wt 268.0 lb

## 2013-05-05 DIAGNOSIS — Z23 Encounter for immunization: Secondary | ICD-10-CM | POA: Diagnosis not present

## 2013-05-05 DIAGNOSIS — J45909 Unspecified asthma, uncomplicated: Secondary | ICD-10-CM

## 2013-05-05 DIAGNOSIS — IMO0002 Reserved for concepts with insufficient information to code with codable children: Secondary | ICD-10-CM

## 2013-05-05 DIAGNOSIS — E669 Obesity, unspecified: Secondary | ICD-10-CM

## 2013-05-05 DIAGNOSIS — D631 Anemia in chronic kidney disease: Secondary | ICD-10-CM

## 2013-05-05 DIAGNOSIS — I129 Hypertensive chronic kidney disease with stage 1 through stage 4 chronic kidney disease, or unspecified chronic kidney disease: Secondary | ICD-10-CM

## 2013-05-05 DIAGNOSIS — N189 Chronic kidney disease, unspecified: Secondary | ICD-10-CM

## 2013-05-05 DIAGNOSIS — E785 Hyperlipidemia, unspecified: Secondary | ICD-10-CM

## 2013-05-05 DIAGNOSIS — E1129 Type 2 diabetes mellitus with other diabetic kidney complication: Secondary | ICD-10-CM

## 2013-05-05 DIAGNOSIS — N19 Unspecified kidney failure: Secondary | ICD-10-CM

## 2013-05-05 DIAGNOSIS — E1165 Type 2 diabetes mellitus with hyperglycemia: Secondary | ICD-10-CM | POA: Diagnosis not present

## 2013-05-05 DIAGNOSIS — N039 Chronic nephritic syndrome with unspecified morphologic changes: Secondary | ICD-10-CM

## 2013-05-05 HISTORY — DX: Type 2 diabetes mellitus with other diabetic kidney complication: E11.29

## 2013-05-05 HISTORY — DX: Obesity, unspecified: E66.9

## 2013-05-05 HISTORY — DX: Reserved for concepts with insufficient information to code with codable children: IMO0002

## 2013-05-05 LAB — CBC WITH DIFFERENTIAL/PLATELET
Basophils Absolute: 0 10*3/uL (ref 0.0–0.2)
Basos: 0 %
Eos: 5 %
Eosinophils Absolute: 0.2 10*3/uL (ref 0.0–0.4)
HEMATOCRIT: 31 % — AB (ref 37.5–51.0)
Hemoglobin: 10.6 g/dL — ABNORMAL LOW (ref 12.6–17.7)
IMMATURE GRANULOCYTES: 0 %
Immature Grans (Abs): 0 10*3/uL (ref 0.0–0.1)
LYMPHS ABS: 1.7 10*3/uL (ref 0.7–3.1)
Lymphs: 44 %
MCH: 30.5 pg (ref 26.6–33.0)
MCHC: 34.2 g/dL (ref 31.5–35.7)
MCV: 89 fL (ref 79–97)
MONOS ABS: 0.4 10*3/uL (ref 0.1–0.9)
Monocytes: 9 %
Neutrophils Absolute: 1.6 10*3/uL (ref 1.4–7.0)
Neutrophils Relative %: 42 %
RBC: 3.47 x10E6/uL — ABNORMAL LOW (ref 4.14–5.80)
RDW: 14 % (ref 12.3–15.4)
WBC: 3.8 10*3/uL (ref 3.4–10.8)

## 2013-05-05 LAB — LIPID PANEL
CHOL/HDL RATIO: 4.4 ratio (ref 0.0–5.0)
Cholesterol, Total: 200 mg/dL — ABNORMAL HIGH (ref 100–199)
HDL: 45 mg/dL (ref >39)
LDL Calculated: 128 mg/dL — ABNORMAL HIGH (ref 0–99)
Triglycerides: 134 mg/dL (ref 0–149)
VLDL Cholesterol Cal: 27 mg/dL (ref 5–40)

## 2013-05-05 LAB — COMPREHENSIVE METABOLIC PANEL
A/G RATIO: 2.2 (ref 1.1–2.5)
ALK PHOS: 73 IU/L (ref 39–117)
ALT: 36 IU/L (ref 0–44)
AST: 26 IU/L (ref 0–40)
Albumin: 4.1 g/dL (ref 3.6–4.8)
BUN / CREAT RATIO: 18 (ref 10–22)
BUN: 24 mg/dL (ref 8–27)
CALCIUM: 9.5 mg/dL (ref 8.6–10.2)
CO2: 21 mmol/L (ref 18–29)
CREATININE: 1.33 mg/dL — AB (ref 0.76–1.27)
Chloride: 100 mmol/L (ref 97–108)
GFR calc Af Amer: 64 mL/min/{1.73_m2} (ref >59)
GFR, EST NON AFRICAN AMERICAN: 56 mL/min/{1.73_m2} — AB (ref >59)
Globulin, Total: 1.9 g/dL (ref 1.5–4.5)
Glucose: 294 mg/dL — ABNORMAL HIGH (ref 65–99)
POTASSIUM: 5.2 mmol/L (ref 3.5–5.2)
SODIUM: 137 mmol/L (ref 134–144)
Total Bilirubin: 0.2 mg/dL (ref 0.0–1.2)
Total Protein: 6 g/dL (ref 6.0–8.5)

## 2013-05-05 LAB — HEMOGLOBIN A1C
Est. average glucose Bld gHb Est-mCnc: 252 mg/dL
Hgb A1c MFr Bld: 10.4 % — ABNORMAL HIGH (ref 4.8–5.6)

## 2013-05-05 MED ORDER — LINAGLIPTIN 5 MG PO TABS: 5.0000 mg | ORAL_TABLET | Freq: Every day | ORAL | Status: DC

## 2013-05-05 MED ORDER — ATORVASTATIN CALCIUM 10 MG PO TABS: 10.0000 mg | ORAL_TABLET | Freq: Every day | ORAL | Status: DC

## 2013-05-05 MED ORDER — INSULIN DETEMIR 100 UNIT/ML ~~LOC~~ SOLN: 15.0000 [IU] | Freq: Two times a day (BID) | SUBCUTANEOUS | Status: DC

## 2013-05-05 NOTE — Progress Notes (Signed)
Patient ID: Henry Carroll, male   DOB: 19-Jun-1950, 63 y.o.   MRN: [id number]    Chief Complaint  Patient presents with  . Annual Exam    physical, EKG, MMSE  . Immunizations    pneumococcal vaccine    HPI 63 y/o male patient is here for his physical exam. He denies any complaints this visit He is due for his pneumococcal vaccine He is uptodate with influenza vaccine His weight has been stable He has not had shingles immunization. Has hx of chicken pox and shingles. Been more than a year since last shingles attack Colonoscopy in 2010 was normal as per pt. Next due in 10 years  Review of Systems  Constitutional: Negative for fever, chills, weight loss, malaise/fatigue and diaphoresis.  HENT: Negative for congestion, hearing loss and sore throat.   Eyes: Negative for blurred vision, double vision and discharge. last eye exam was 2012 Respiratory: Negative for cough, sputum production, shortness of breath and wheezing.  no recent asthma attack Cardiovascular: Negative for chest pain, palpitations, orthopnea and leg swelling.  Gastrointestinal: Negative for heartburn, nausea, vomiting, abdominal pain, diarrhea and constipation.  Genitourinary: Negative for dysuria, urgency, frequency and flank pain.  Musculoskeletal: Negative for back pain, falls, joint pain and myalgias.  Skin: Negative for itching and rash.  Neurological: Negative for dizziness, tingling, focal weakness and headaches.  Psychiatric/Behavioral: Negative for depression and memory loss. The patient is not nervous/anxious.    Past Medical History  Diagnosis Date  . Diabetes mellitus   . Allergy   . Asthma   . Hypertension   . Anemia   . Anxiety   . Hyperlipidemia   . Problems with sexual function    Past Surgical History  Procedure Laterality Date  . Tonsillectomy  1962  . Hernia repair  0000000    Umbilical  . Spine surgery  2006    L4 & L5  . Colon surgery  2011    Colonoscopy   Current Outpatient  Prescriptions on File Prior to Visit  Medication Sig Dispense Refill  . albuterol (PROAIR HFA) 108 (90 BASE) MCG/ACT inhaler Inhale two puffs every 6 hours as needed for shortness of breath  2 Inhaler  3  . albuterol (PROVENTIL) (2.5 MG/3ML) 0.083% nebulizer solution USE 1 INHALATION SOLUTION PER NEBULIZER EVERY 6-8 HOURS  3 mL  5  . AMBULATORY NON FORMULARY MEDICATION One Touch Test Strips Test blood sugar three time daily for blood sugar Dx: 250.00  100 strip  3  . amLODipine (NORVASC) 10 MG tablet TAKE ONE TABLET BY MOUTH ONCE DAILY  30 tablet  3  . aspirin EC 81 MG tablet Take 81 mg by mouth daily.      . BD PEN NEEDLE NANO U/F 32G X 4 MM MISC       . benazepril (LOTENSIN) 40 MG tablet TAKE ONE TABLET BY MOUTH ONCE DAILY FOR BLOOD PRESSURE  30 tablet  3  . chlorthalidone (HYGROTON) 25 MG tablet Take 2 tablets (50 mg total) by mouth daily.  180 tablet  1  . Coenzyme Q10 (CO Q 10 PO) Take 1 tablet by mouth daily.      . Fluticasone-Salmeterol (ADVAIR) 100-50 MCG/DOSE AEPB Inhale 2 puffs into the lungs 2 (two) times daily.  1 each  3  . hydrALAZINE (APRESOLINE) 25 MG tablet Take 1 tablet (25 mg total) by mouth 2 (two) times daily.  180 tablet  1  . metFORMIN (GLUCOPHAGE) 500 MG tablet TAKE TWO TABLETS BY  MOUTH TWICE DAILY WITH FOOD TO  HELP  CONTROL  BLOOD  SUGAR  120 tablet  3  . Syringe, Disposable, 3 ML MISC Use syringe daily with Humalog, DX:250.00  200 each  5  . terbinafine (LAMISIL) 1 % cream Apply 1 application topically daily as needed.       No current facility-administered medications on file prior to visit.   Family History  Problem Relation Age of Onset  . Diabetes Mother   . Heart disease Father   . Stroke Sister    History   Social History  . Marital Status: Married    Spouse Name: N/A    Number of Children: N/A  . Years of Education: N/A   Occupational History  . Not on file.   Social History Main Topics  . Smoking status: Never Smoker   . Smokeless tobacco:  Not on file  . Alcohol Use: Yes     Comment: socailly  . Drug Use: No  . Sexual Activity: Yes    Birth Control/ Protection: None   Other Topics Concern  . Not on file   Social History Narrative  . No narrative on file   Physical exam BP 146/70  Pulse 88  Temp(Src) 97.9 F (36.6 C) (Oral)  Resp 16  Ht 6' 0.75" (1.848 m)  Wt 268 lb (121.564 kg)  BMI 35.60 kg/m2  SpO2 97%  General- elderly male in no acute distress, obese Head- atraumatic, normocephalic Eyes- PERRLA, EOMI, no pallor, no icterus, no discharge Ears- left ear normal tympanic membrane and normal external ear canal , right ear normal tympanic membrane and normal external ear canal Neck- no lymphadenopathy, no thyromegaly, no jugular vein distension, no carotid bruit Nose- normal nasaal mucosa, no maxillary sinus tenderness, no frontal sinus tenderness Mouth- normal mucus membrane, no oral thrush, normal oropharynx Chest- no chest wall deformities, no chest wall tenderness Breast- no masses, no palpable lumps, normal nipple and areola exam, no axillary lymphadenopathy Cardiovascular- normal s1,s2, no murmurs/ rubs/ gallops, normal distal pulses Respiratory- bilateral clear to auscultation, no wheeze, no rhonchi, no crackles Abdomen- bowel sounds present, soft, non tender, no organomegaly, no abdominal bruits, no guarding or rigidity, no CVA tenderness Musculoskeletal- able to move all 4 extremities, no spinal and paraspinal tenderness, steady gait, no use of assistive device, normal range of motion, no leg edema Neurological- no focal deficit, normal reflexes except diminished right knee reflex, normal muscle strength, normal sensation to fine touch and vibration Skin- warm and dry Psychiatry- alert and oriented to person, place and time, normal mood and affect  Labs-  CBC    Component Value Date/Time   WBC 3.8 05/04/2013 1348   WBC 6.4 01/18/2012 1257   RBC 3.47* 05/04/2013 1348   RBC 4.49* 01/18/2012 1257   HGB  10.6* 05/04/2013 1348   HGB 13.5* 01/18/2012 1257   HCT 31.0* 05/04/2013 1348   HCT 43.8 01/18/2012 1257   MCV 89 05/04/2013 1348   MCV 97.5* 01/18/2012 1257   MCH 30.5 05/04/2013 1348   MCH 30.1 01/18/2012 1257   MCHC 34.2 05/04/2013 1348   MCHC 30.8* 01/18/2012 1257   RDW 14.0 05/04/2013 1348   LYMPHSABS 1.7 05/04/2013 1348   EOSABS 0.2 05/04/2013 1348   BASOSABS 0.0 05/04/2013 1348    CMP     Component Value Date/Time   NA 137 05/04/2013 1348   K 5.2 05/04/2013 1348   CL 100 05/04/2013 1348   CO2 21 05/04/2013 1348   GLUCOSE  294* 05/04/2013 1348   BUN 24 05/04/2013 1348   CREATININE 1.33* 05/04/2013 1348   CALCIUM 9.5 05/04/2013 1348   PROT 6.0 05/04/2013 1348   AST 26 05/04/2013 1348   ALT 36 05/04/2013 1348   ALKPHOS 73 05/04/2013 1348   BILITOT 0.2 05/04/2013 1348   GFRNONAA 56* 05/04/2013 1348   GFRAA 64 05/04/2013 1348   Lab Results  Component Value Date   HGBA1C 10.4* 05/04/2013   05/05/13  MMSE- 28/30, passed clock draw 05/05/13 ekg- NSR, HR 88/min, left axis deviation  Assessment/plan 1. DM type 2, uncontrolled, with renal complications cbg ranging between 120-302 with few in 65-75 range a1c up from 8.9 to 10.4 Urine microalbumin today Stop metformin given worsening renal function Stop humalog Start levemir 25 u bid Start tradjenta 5 mg daily Continue benazepril and aspirin Start lipitor Dietary and exercise counselling provided Normal foot exam. Foot care instruction provided Will provide pneumococcal vaccine  2. Asthma, chronic Stable. Continue proair and advair for now  3. Hypertensive renal disease with renal failure Stable bp. Continue amlodipine,chlorthalidone, benazepril and hydralazine. ekg reviewed  4. Hyperlipidemia LDL goal < 100 With ldl > 100, start lipitor 10 mg daily. Continue coenzyme   5. Anemia in chronic kidney disease Monitor clinically, if < 10 with normal MCV, consider aransep

## 2013-05-05 NOTE — Progress Notes (Signed)
Passed clock drawing 

## 2013-05-07 LAB — MICROALBUMIN / CREATININE URINE RATIO
Creatinine, Ur: 126.8 mg/dL (ref 22.0–328.0)
MICROALB/CREAT RATIO: 852.7 mg/g{creat} — AB (ref 0.0–30.0)
Microalbumin, Urine: 1081.2 ug/mL — ABNORMAL HIGH (ref 0.0–17.0)

## 2013-05-12 ENCOUNTER — Other Ambulatory Visit: Payer: Self-pay | Admitting: Internal Medicine

## 2013-05-21 ENCOUNTER — Encounter: Payer: Self-pay | Admitting: Internal Medicine

## 2013-06-09 ENCOUNTER — Encounter: Payer: Self-pay | Admitting: Internal Medicine

## 2013-06-09 ENCOUNTER — Ambulatory Visit (INDEPENDENT_AMBULATORY_CARE_PROVIDER_SITE_OTHER): Payer: Federal, State, Local not specified - PPO | Admitting: Internal Medicine

## 2013-06-09 VITALS — BP 140/78 | HR 91 | Temp 97.7°F | Resp 10 | Wt 254.0 lb

## 2013-06-09 DIAGNOSIS — E1165 Type 2 diabetes mellitus with hyperglycemia: Principal | ICD-10-CM

## 2013-06-09 DIAGNOSIS — E1129 Type 2 diabetes mellitus with other diabetic kidney complication: Secondary | ICD-10-CM | POA: Insufficient documentation

## 2013-06-09 LAB — GLUCOSE, POCT (MANUAL RESULT ENTRY): POC GLUCOSE: 425 mg/dL — AB (ref 70–99)

## 2013-06-09 MED ORDER — INSULIN DETEMIR 100 UNIT/ML ~~LOC~~ SOLN: 40.0000 [IU] | Freq: Two times a day (BID) | SUBCUTANEOUS | Status: DC

## 2013-06-09 MED ORDER — INSULIN ASPART 100 UNIT/ML ~~LOC~~ SOLN: 5.0000 [IU] | Freq: Three times a day (TID) | SUBCUTANEOUS | Status: DC

## 2013-06-09 NOTE — Progress Notes (Signed)
Patient ID: Henry Carroll, male   DOB: Feb 24, 1950, 63 y.o.   MRN: [id number]    Chief Complaint  Patient presents with  . Hyperglycemia    1 month f/u on DM, "BS is out of control"   No Known Allergies  HPI 63 y/io male patient with history of diabetes is here for follow up.  His cbg have been running high mostly above 300s with some in 500 as well. cbg this am at home was 407 He was changed to levemir 15 u bid last visit and has self increased it to 30 u bid. This change was made 2 weeks back. His sugar reading has not been above 480 since then but still elevated He seems to have lost his appetite but denies nausea and vomiting Denies abdominal pain, chest pain or trouble breathing Denies diarrhea or urinary complaints.  Does have polyuria and polydypsia  ROS Negative for dizziness or lightheadedness Denies headache but has occassional blurry vision. No blurry vision at present Has lost weight  Past Medical History  Diagnosis Date  . Diabetes mellitus   . Allergy   . Asthma   . Hypertension   . Anemia   . Anxiety   . Hyperlipidemia   . Problems with sexual function    Past Surgical History  Procedure Laterality Date  . Tonsillectomy  1962  . Hernia repair  0000000    Umbilical  . Spine surgery  2006    L4 & L5  . Colon surgery  2011    Colonoscopy   Current Outpatient Prescriptions on File Prior to Visit  Medication Sig Dispense Refill  . albuterol (PROAIR HFA) 108 (90 BASE) MCG/ACT inhaler Inhale two puffs every 6 hours as needed for shortness of breath  2 Inhaler  3  . albuterol (PROVENTIL) (2.5 MG/3ML) 0.083% nebulizer solution USE 1 INHALATION SOLUTION PER NEBULIZER EVERY 6-8 HOURS  3 mL  5  . AMBULATORY NON FORMULARY MEDICATION One Touch Test Strips Test blood sugar three time daily for blood sugar Dx: 250.00  100 strip  3  . amLODipine (NORVASC) 10 MG tablet TAKE ONE TABLET BY MOUTH ONCE DAILY  30 tablet  3  . aspirin EC 81 MG tablet Take 81 mg by mouth  daily.      Marland Kitchen atorvastatin (LIPITOR) 10 MG tablet Take 1 tablet (10 mg total) by mouth daily.  30 tablet  6  . BD PEN NEEDLE NANO U/F 32G X 4 MM MISC       . benazepril (LOTENSIN) 40 MG tablet TAKE ONE TABLET BY MOUTH ONCE DAILY FOR BLOOD PRESSURE  30 tablet  3  . chlorthalidone (HYGROTON) 25 MG tablet Take 2 tablets (50 mg total) by mouth daily.  180 tablet  1  . Coenzyme Q10 (CO Q 10 PO) Take 1 tablet by mouth daily.      . Fluticasone-Salmeterol (ADVAIR) 100-50 MCG/DOSE AEPB Inhale 2 puffs into the lungs 2 (two) times daily.  1 each  3  . hydrALAZINE (APRESOLINE) 25 MG tablet TAKE ONE TABLET BY MOUTH TWICE DAILY  180 tablet  1  . linagliptin (TRADJENTA) 5 MG TABS tablet Take 1 tablet (5 mg total) by mouth daily.  30 tablet  6  . Syringe, Disposable, 3 ML MISC Use syringe daily with Humalog, DX:250.00  200 each  5  . terbinafine (LAMISIL) 1 % cream Apply 1 application topically daily as needed.       No current facility-administered medications on file prior  to visit.    Physical exam BP 140/78  Pulse 91  Temp(Src) 97.7 F (36.5 C) (Oral)  Resp 10  Wt 254 lb (115.214 kg)  SpO2 94%  General- elderly male in no acute distress, obese Head- atraumatic, normocephalic Eyes- PERRLA, EOMI, no pallor, no icterus, no discharge Neck- no lymphadenopathy, no thyromegaly, no jugular vein distension, no carotid bruit Nose- normal nasaal mucosa, no maxillary sinus tenderness, no frontal sinus tenderness Mouth- normal mucus membrane, no oral thrush, normal oropharynx Cardiovascular- normal s1,s2, no murmurs/ rubs/ gallops, normal distal pulses Respiratory- bilateral clear to auscultation, no wheeze, no rhonchi, no crackles Abdomen- bowel sounds present, soft, non tender Musculoskeletal- able to move all 4 extremities, no spinal and paraspinal tenderness, steady gait, no use of assistive device, normal range of motion, no leg edema Neurological- no focal deficit, normal reflexes except diminished  right knee reflex, normal muscle strength, normal sensation to fine touch and vibration Skin- warm and dry Psychiatry- alert and oriented to person, place and time, normal mood and affect   Labs- Lab Results  Component Value Date   HGBA1C 10.4* 05/04/2013   CMP     Component Value Date/Time   NA 137 05/04/2013 1348   K 5.2 05/04/2013 1348   CL 100 05/04/2013 1348   CO2 21 05/04/2013 1348   GLUCOSE 294* 05/04/2013 1348   BUN 24 05/04/2013 1348   CREATININE 1.33* 05/04/2013 1348   CALCIUM 9.5 05/04/2013 1348   PROT 6.0 05/04/2013 1348   AST 26 05/04/2013 1348   ALT 36 05/04/2013 1348   ALKPHOS 73 05/04/2013 1348   BILITOT 0.2 05/04/2013 1348   GFRNONAA 56* 05/04/2013 1348   GFRAA 64 05/04/2013 1348   cbg in office is 425  Assessment/plan 1. Type II or unspecified type diabetes mellitus with renal manifestations, uncontrolled Reviewed home cbg. On levemir 30 mg bid right now. Will increase this to 40 mg bid for now with tradjenta 5 mg daily and add aspart 5 u for cbg > 250 only with meals. Monitor cbg, log book provided. dietary counselling with advise to not skip meals provided. Will reassess home cbg reading and required amount of aspart in 2 weeks and adjust levemir further. Have stopped metformin with renal impairment - POC Glucose (CBG)   Blanchie Serve, MD  [facility] [phone] (Monday-Friday 8 am - 5 pm) [phone] (afterhours)

## 2013-06-10 ENCOUNTER — Other Ambulatory Visit: Payer: Self-pay | Admitting: Internal Medicine

## 2013-06-24 ENCOUNTER — Ambulatory Visit: Payer: Medicare Other | Admitting: Internal Medicine

## 2013-10-06 ENCOUNTER — Other Ambulatory Visit: Payer: Self-pay | Admitting: *Deleted

## 2013-10-06 ENCOUNTER — Other Ambulatory Visit: Payer: Federal, State, Local not specified - PPO

## 2013-10-06 DIAGNOSIS — E119 Type 2 diabetes mellitus without complications: Secondary | ICD-10-CM

## 2013-10-06 DIAGNOSIS — N189 Chronic kidney disease, unspecified: Secondary | ICD-10-CM | POA: Diagnosis not present

## 2013-10-06 DIAGNOSIS — E785 Hyperlipidemia, unspecified: Secondary | ICD-10-CM | POA: Diagnosis not present

## 2013-10-06 DIAGNOSIS — D631 Anemia in chronic kidney disease: Secondary | ICD-10-CM | POA: Diagnosis not present

## 2013-10-07 ENCOUNTER — Encounter: Payer: Self-pay | Admitting: Internal Medicine

## 2013-10-07 ENCOUNTER — Ambulatory Visit (INDEPENDENT_AMBULATORY_CARE_PROVIDER_SITE_OTHER): Payer: Medicare Other | Admitting: Internal Medicine

## 2013-10-07 VITALS — BP 158/86 | HR 81 | Resp 10 | Ht 73.0 in | Wt 260.0 lb

## 2013-10-07 DIAGNOSIS — N189 Chronic kidney disease, unspecified: Secondary | ICD-10-CM

## 2013-10-07 DIAGNOSIS — M25562 Pain in left knee: Secondary | ICD-10-CM

## 2013-10-07 DIAGNOSIS — E1129 Type 2 diabetes mellitus with other diabetic kidney complication: Secondary | ICD-10-CM | POA: Diagnosis not present

## 2013-10-07 DIAGNOSIS — J4520 Mild intermittent asthma, uncomplicated: Secondary | ICD-10-CM

## 2013-10-07 DIAGNOSIS — E1165 Type 2 diabetes mellitus with hyperglycemia: Secondary | ICD-10-CM | POA: Diagnosis not present

## 2013-10-07 DIAGNOSIS — E785 Hyperlipidemia, unspecified: Secondary | ICD-10-CM

## 2013-10-07 DIAGNOSIS — M25569 Pain in unspecified knee: Secondary | ICD-10-CM

## 2013-10-07 DIAGNOSIS — E669 Obesity, unspecified: Secondary | ICD-10-CM

## 2013-10-07 DIAGNOSIS — N039 Chronic nephritic syndrome with unspecified morphologic changes: Secondary | ICD-10-CM

## 2013-10-07 DIAGNOSIS — D631 Anemia in chronic kidney disease: Secondary | ICD-10-CM | POA: Diagnosis not present

## 2013-10-07 DIAGNOSIS — N183 Chronic kidney disease, stage 3 unspecified: Secondary | ICD-10-CM

## 2013-10-07 DIAGNOSIS — I10 Essential (primary) hypertension: Secondary | ICD-10-CM

## 2013-10-07 DIAGNOSIS — J45909 Unspecified asthma, uncomplicated: Secondary | ICD-10-CM

## 2013-10-07 LAB — COMPREHENSIVE METABOLIC PANEL
ALBUMIN: 3.6 g/dL (ref 3.6–4.8)
ALT: 35 IU/L (ref 0–44)
AST: 26 IU/L (ref 0–40)
Albumin/Globulin Ratio: 1.6 (ref 1.1–2.5)
Alkaline Phosphatase: 86 IU/L (ref 39–117)
BUN / CREAT RATIO: 16 (ref 10–22)
BUN: 22 mg/dL (ref 8–27)
CHLORIDE: 101 mmol/L (ref 97–108)
CO2: 25 mmol/L (ref 18–29)
Calcium: 9.2 mg/dL (ref 8.6–10.2)
Creatinine, Ser: 1.37 mg/dL — ABNORMAL HIGH (ref 0.76–1.27)
GFR calc non Af Amer: 54 mL/min/{1.73_m2} — ABNORMAL LOW (ref >59)
GFR, EST AFRICAN AMERICAN: 62 mL/min/{1.73_m2} (ref >59)
Globulin, Total: 2.2 g/dL (ref 1.5–4.5)
Glucose: 266 mg/dL — ABNORMAL HIGH (ref 65–99)
Potassium: 4.9 mmol/L (ref 3.5–5.2)
SODIUM: 137 mmol/L (ref 134–144)
Total Protein: 5.8 g/dL — ABNORMAL LOW (ref 6.0–8.5)

## 2013-10-07 LAB — LIPID PANEL
CHOLESTEROL TOTAL: 190 mg/dL (ref 100–199)
Chol/HDL Ratio: 4.1 ratio units (ref 0.0–5.0)
HDL: 46 mg/dL (ref >39)
LDL Calculated: 121 mg/dL — ABNORMAL HIGH (ref 0–99)
Triglycerides: 117 mg/dL (ref 0–149)
VLDL CHOLESTEROL CAL: 23 mg/dL (ref 5–40)

## 2013-10-07 LAB — HEMOGLOBIN A1C
Est. average glucose Bld gHb Est-mCnc: 278 mg/dL
Hgb A1c MFr Bld: 11.3 % — ABNORMAL HIGH (ref 4.8–5.6)

## 2013-10-07 LAB — VITAMIN D 25 HYDROXY (VIT D DEFICIENCY, FRACTURES): VIT D 25 HYDROXY: 25.2 ng/mL — AB (ref 30.0–100.0)

## 2013-10-07 MED ORDER — FLUTICASONE-SALMETEROL 100-50 MCG/DOSE IN AEPB: INHALATION_SPRAY | RESPIRATORY_TRACT | Status: DC

## 2013-10-07 MED ORDER — LINAGLIPTIN 5 MG PO TABS: 5.0000 mg | ORAL_TABLET | Freq: Every day | ORAL | Status: DC

## 2013-10-07 MED ORDER — AMLODIPINE BESYLATE 10 MG PO TABS: ORAL_TABLET | ORAL | Status: DC

## 2013-10-07 MED ORDER — INSULIN ASPART 100 UNIT/ML ~~LOC~~ SOLN: 8.0000 [IU] | Freq: Three times a day (TID) | SUBCUTANEOUS | Status: DC

## 2013-10-07 MED ORDER — ALBUTEROL SULFATE HFA 108 (90 BASE) MCG/ACT IN AERS: INHALATION_SPRAY | RESPIRATORY_TRACT | Status: DC

## 2013-10-07 MED ORDER — CHOLECALCIFEROL 25 MCG (1000 UT) PO CAPS: 2000.0000 [IU] | ORAL_CAPSULE | Freq: Every day | ORAL | Status: DC

## 2013-10-07 MED ORDER — INSULIN DETEMIR 100 UNIT/ML ~~LOC~~ SOLN: 50.0000 [IU] | Freq: Two times a day (BID) | SUBCUTANEOUS | Status: DC

## 2013-10-07 MED ORDER — BENAZEPRIL HCL 40 MG PO TABS: ORAL_TABLET | ORAL | Status: DC

## 2013-10-07 MED ORDER — CHLORTHALIDONE 25 MG PO TABS: 50.0000 mg | ORAL_TABLET | Freq: Every day | ORAL | Status: DC

## 2013-10-07 NOTE — Progress Notes (Signed)
Patient ID: Henry Carroll, male   DOB: Jan 04, 1951, 63 y.o.   MRN: [id number]    Chief Complaint  Patient presents with  . Medical Management of Chronic Issues    4 month follow-up on DM   . Knee Pain    Right knee pain since Sunday, patient was on feet for a while and knee pain onset    No Known Allergies  HPI 63 y/o male pt is here for routine follow up on his diabetes. He has hx of overweight, DM, HTN, COPD, ckd. cbg tis am was 163. He has not checked it on regular basis but most of the times he has noticed it above 200. Denies hypoglycemia. He had ran out of his insulin for sometime and thus has not taken it. Has not been taking his benazepril and amlodipine. Has not been following on his diet for now as he has been busy with other things. He plays golf for exercise. No other form of exercise. He would like to be regular with exercise ad take medications on regular basis but has not been able to do so.  Wt Readings from Last 3 Encounters:  10/07/13 260 lb (117.935 kg)  06/09/13 254 lb (115.214 kg)  05/05/13 268 lb (121.564 kg)   Review of Systems  Constitutional: Negative for fever, chills,  malaise/fatigue and diaphoresis. has gained 6 lbs since last visit HENT: Negative for congestion, hearing loss and sore throat.   Eyes: Negative for blurred vision, double vision and discharge. has not seen eye doctor recently Respiratory: Negative for cough, sputum production, shortness of breath and wheezing.   Cardiovascular: Negative for chest pain, palpitations, orthopnea and leg swelling.  Gastrointestinal: Negative for heartburn, nausea, vomiting, abdominal pain, diarrhea and constipation.  Genitourinary: Negative for dysuria, urgency, frequency and flank pain.  Musculoskeletal: Negative for back pain, falls has been having knee joint pain in left knee since Sunday- he was on his feet the whole day and by the end of the day his knee has been bothering him has not taken anything for  pain Skin: Negative for itching and rash.  Neurological: Negative for dizziness, tingling, focal weakness and headaches.  Psychiatric/Behavioral: Negative for depression and memory loss. The patient is not nervous/anxious.    Past Medical History  Diagnosis Date  . Diabetes mellitus   . Allergy   . Asthma   . Hypertension   . Anemia   . Anxiety   . Hyperlipidemia   . Problems with sexual function    Medication reviewed. See [facility]  Physical exam BP 158/86  Pulse 81  Resp 10  Ht 6\' 1"  (1.854 m)  Wt 260 lb (117.935 kg)  BMI 34.31 kg/m2  SpO2 93%  General- elderly male in no acute distress, obese Head- atraumatic, normocephalic Eyes- PERRLA, EOMI, no pallor, no icterus, no discharge Ears- left ear normal tympanic membrane and normal external ear canal , right ear normal tympanic membrane and normal external ear canal Neck- no lymphadenopathy, no thyromegaly, no jugular vein distension, no carotid bruit Mouth- normal mucus membrane Cardiovascular- normal s1,s2, no murmurs, normal distal pulses Respiratory- bilateral clear to auscultation, no wheeze, no rhonchi, no crackles Abdomen- bowel sounds present, soft, non tender Musculoskeletal- able to move all 4 extremities, no spinal and paraspinal tenderness, steady gait, no use of assistive device, normal range of motion, no leg edema Neurological- no focal deficit, normal reflexes except diminished right knee reflex, normal muscle strength, normal sensation to fine touch and vibration Skin- warm  and dry Psychiatry- alert and oriented to person, place and time, normal mood and affect   Lab Results  Component Value Date   HGBA1C 11.3* 10/06/2013   Lipid Panel     Component Value Date/Time   TRIG 117 10/06/2013 1146   HDL 46 10/06/2013 1146   CHOLHDL 4.1 10/06/2013 1146   LDLCALC 121* 10/06/2013 1146   CMP     Component Value Date/Time   NA 137 10/06/2013 1146   K 4.9 10/06/2013 1146   CL 101 10/06/2013 1146   CO2 25 10/06/2013  1146   GLUCOSE 266* 10/06/2013 1146   BUN 22 10/06/2013 1146   CREATININE 1.37* 10/06/2013 1146   CALCIUM 9.2 10/06/2013 1146   PROT 5.8* 10/06/2013 1146   AST 26 10/06/2013 1146   ALT 35 10/06/2013 1146   ALKPHOS 86 10/06/2013 1146   BILITOT <0.2 10/06/2013 1146   GFRNONAA 54* 10/06/2013 1146   GFRAA 62 10/06/2013 1146   Vit d 25.2  Assessment/plan  1. Type II or unspecified type diabetes mellitus with renal manifestations, uncontrolled Uncontrolled and worsened from before. Will increase levemir to 50 u bid and aspart to 8 u tid with meals for cbg > 200. Pt to record cbg and bring log book in a month for review. On aspirin and lipitor with ACEI. Normal foot exam. Needs his eye exam with ophthalmologist. Also continue tradjenta. Med compliance reinforced  2. Asthma, chronic, mild intermittent, uncomplicated Controlled. Continue proair and monitor clinically  3.  Left knee pain With his OA and overuse injur likely. Advised rest, ice pack and prn tylenol for now  4. CKD (chronic kidney disease) stage 3, GFR 30-59 ml/min Monitor clinically, avoid NSAIDs.  5. Obesity (BMI 30-39.9) Encouraged routine exercise of atleast 30 minutes for 5 days a week and dietary counselling provided  6. Hyperlipidemia with target LDL less than 100 Continue lipitor for now  7. HTN uncontrolled HTN. Pt stopped amlodipine and benazepril for unclear reason. To resume these. Continue hydralazine and chlorthalidone

## 2013-10-07 NOTE — Patient Instructions (Signed)
Do not stop any med by yourself  levemir 50 u twice a day  aspart 8 u with meals for blood sugar greater than 200  Take all the blood pressure meds- amlodipine, chlorthalidone, benazepril and hydralazine  Record home blood pressure and sugar readings and bring to office next visit  Take vitamin d supplement as prescribed

## 2013-10-09 DIAGNOSIS — I10 Essential (primary) hypertension: Secondary | ICD-10-CM

## 2013-10-09 HISTORY — DX: Essential (primary) hypertension: I10

## 2013-11-02 ENCOUNTER — Emergency Department (INDEPENDENT_AMBULATORY_CARE_PROVIDER_SITE_OTHER)
Admission: EM | Admit: 2013-11-02 | Discharge: 2013-11-02 | Disposition: A | Discharge status: Home / Self Care | Payer: Medicare Other | Source: Home / Self Care | Attending: Family Medicine | Admitting: Family Medicine

## 2013-11-02 ENCOUNTER — Encounter (HOSPITAL_COMMUNITY): Payer: Self-pay | Admitting: Emergency Medicine

## 2013-11-02 DIAGNOSIS — H6092 Unspecified otitis externa, left ear: Secondary | ICD-10-CM

## 2013-11-02 DIAGNOSIS — H60399 Other infective otitis externa, unspecified ear: Secondary | ICD-10-CM | POA: Diagnosis not present

## 2013-11-02 MED ORDER — CEPHALEXIN 500 MG PO CAPS: 500.0000 mg | ORAL_CAPSULE | Freq: Three times a day (TID) | ORAL | Status: DC

## 2013-11-02 MED ORDER — CIPROFLOXACIN-HYDROCORTISONE 0.2-1 % OT SUSP: 4.0000 [drp] | Freq: Three times a day (TID) | OTIC | Status: DC

## 2013-11-02 NOTE — ED Provider Notes (Signed)
CSN: [id number]     Arrival date & time 11/02/13  1459 History   First MD Initiated Contact with Patient 11/02/13 1617     Chief Complaint  Patient presents with  . URI   (Consider location/radiation/quality/duration/timing/severity/associated sxs/prior Treatment) Patient is a 63 y.o. male presenting with ear pain.  Otalgia Location:  Left Behind ear:  No abnormality Quality:  Sharp Severity:  Mild Duration:  2 days Chronicity:  New Relieved by:  None tried Worsened by:  Nothing tried Associated symptoms: no congestion, no ear discharge, no fever, no hearing loss and no rhinorrhea     Past Medical History  Diagnosis Date  . Diabetes mellitus   . Allergy   . Asthma   . Hypertension   . Anemia   . Anxiety   . Hyperlipidemia   . Problems with sexual function    Past Surgical History  Procedure Laterality Date  . Tonsillectomy  1962  . Hernia repair  0000000    Umbilical  . Spine surgery  2006    L4 & L5  . Colon surgery  2011    Colonoscopy   Family History  Problem Relation Age of Onset  . Diabetes Mother   . Heart disease Father   . Stroke Sister    History  Substance Use Topics  . Smoking status: Never Smoker   . Smokeless tobacco: Not on file  . Alcohol Use: Yes     Comment: socailly    Review of Systems  Constitutional: Negative.  Negative for fever.  HENT: Positive for ear pain. Negative for congestion, ear discharge, hearing loss, postnasal drip and rhinorrhea.     Allergies  Review of patient's allergies indicates no known allergies.  Home Medications   Prior to Admission medications   Medication Sig Start Date End Date Taking? Authorizing Provider  albuterol (PROAIR HFA) 108 (90 BASE) MCG/ACT inhaler Inhale two puffs every 6 hours as needed for shortness of breath 10/07/13   Blanchie Serve, MD  albuterol (PROVENTIL) (2.5 MG/3ML) 0.083% nebulizer solution USE 1 INHALATION SOLUTION PER NEBULIZER EVERY 6-8 HOURS 01/14/13   Blanchie Serve, MD   AMBULATORY NON FORMULARY MEDICATION One Touch Test Strips Test blood sugar three time daily for blood sugar Dx: 250.00 12/03/12   Estill Dooms, MD  amLODipine (NORVASC) 10 MG tablet TAKE ONE TABLET BY MOUTH ONCE DAILY 10/07/13   Blanchie Serve, MD  aspirin EC 81 MG tablet Take 81 mg by mouth daily.    Historical Provider, MD  atorvastatin (LIPITOR) 10 MG tablet Take 1 tablet (10 mg total) by mouth daily. 05/05/13   Blanchie Serve, MD  BD PEN NEEDLE NANO U/F 32G X 4 MM MISC  08/06/12   Historical Provider, MD  benazepril (LOTENSIN) 40 MG tablet TAKE ONE TABLET BY MOUTH ONCE DAILY FOR BLOOD PRESSURE 10/07/13   Blanchie Serve, MD  cephALEXin (KEFLEX) 500 MG capsule Take 1 capsule (500 mg total) by mouth 3 (three) times daily. Take all of medicine and drink lots of fluids 11/02/13   Billy Fischer, MD  chlorthalidone (HYGROTON) 25 MG tablet Take 2 tablets (50 mg total) by mouth daily. 10/07/13   Blanchie Serve, MD  Cholecalciferol ([employer] VITAMIN D3) 1000 UNITS capsule Take 2 capsules (2,000 Units total) by mouth daily. 10/07/13   Blanchie Serve, MD  ciprofloxacin-hydrocortisone (CIPRO HC) otic suspension Place 4 drops into the left ear 3 (three) times daily. 11/02/13   Billy Fischer, MD  Coenzyme Q10 (CO Q 10 PO)  Take 1 tablet by mouth daily.    Historical Provider, MD  Fluticasone-Salmeterol (ADVAIR DISKUS) 100-50 MCG/DOSE AEPB INHALE ONE DOSE BY MOUTH TWICE DAILY 10/07/13   Blanchie Serve, MD  hydrALAZINE (APRESOLINE) 25 MG tablet TAKE ONE TABLET BY MOUTH TWICE DAILY 05/12/13   Estill Dooms, MD  insulin aspart (NOVOLOG) 100 UNIT/ML injection Inject 8 Units into the skin 3 (three) times daily before meals. Inject 8 u insulin only for blood sugar greater than 200. Do not skip your meals 10/07/13   Blanchie Serve, MD  insulin detemir (LEVEMIR) 100 UNIT/ML injection Inject 0.5 mLs (50 Units total) into the skin 2 (two) times daily. 10/07/13   Blanchie Serve, MD  linagliptin (TRADJENTA) 5 MG TABS tablet Take 1 tablet (5 mg  total) by mouth daily. 10/07/13   Blanchie Serve, MD  Syringe, Disposable, 3 ML MISC Use syringe daily with Humalog, DX:250.00 01/14/13   Blanchie Serve, MD  terbinafine (LAMISIL) 1 % cream Apply 1 application topically daily as needed.    Historical Provider, MD   BP 147/87  Pulse 88  Temp(Src) 98.3 F (36.8 C) (Oral)  Resp 18  SpO2 98% Physical Exam  Nursing note and vitals reviewed. Constitutional: He is oriented to person, place, and time. He appears well-developed and well-nourished. No distress.  HENT:  Right Ear: External ear and ear canal normal.  Left Ear: There is tenderness. No drainage or swelling. Tympanic membrane is not injected, not scarred, not perforated, not erythematous and not retracted. Tympanic membrane mobility is normal.  No middle ear effusion. No hemotympanum.  Mouth/Throat: Oropharynx is clear and moist.  Neck: Normal range of motion. Neck supple.  Pulmonary/Chest: Breath sounds normal.  Lymphadenopathy:    He has no cervical adenopathy.  Neurological: He is alert and oriented to person, place, and time.  Skin: Skin is warm and dry.    ED Course  Procedures (including critical care time) Labs Review Labs Reviewed - No data to display  Imaging Review No results found.   MDM   1. Otitis externa of left ear       Billy Fischer, MD 11/02/13 1655

## 2013-11-10 ENCOUNTER — Ambulatory Visit (INDEPENDENT_AMBULATORY_CARE_PROVIDER_SITE_OTHER): Payer: Medicare Other | Admitting: Internal Medicine

## 2013-11-10 ENCOUNTER — Encounter: Payer: Self-pay | Admitting: Internal Medicine

## 2013-11-10 VITALS — BP 168/84 | HR 90 | Temp 97.4°F | Resp 10 | Wt 257.0 lb

## 2013-11-10 DIAGNOSIS — I10 Essential (primary) hypertension: Secondary | ICD-10-CM | POA: Diagnosis not present

## 2013-11-10 DIAGNOSIS — IMO0002 Reserved for concepts with insufficient information to code with codable children: Secondary | ICD-10-CM

## 2013-11-10 DIAGNOSIS — E1129 Type 2 diabetes mellitus with other diabetic kidney complication: Secondary | ICD-10-CM

## 2013-11-10 DIAGNOSIS — E1165 Type 2 diabetes mellitus with hyperglycemia: Secondary | ICD-10-CM | POA: Diagnosis not present

## 2013-11-10 DIAGNOSIS — J4521 Mild intermittent asthma with (acute) exacerbation: Secondary | ICD-10-CM

## 2013-11-10 DIAGNOSIS — J45901 Unspecified asthma with (acute) exacerbation: Secondary | ICD-10-CM | POA: Diagnosis not present

## 2013-11-10 DIAGNOSIS — J209 Acute bronchitis, unspecified: Secondary | ICD-10-CM | POA: Diagnosis not present

## 2013-11-10 MED ORDER — INSULIN ASPART 100 UNIT/ML ~~LOC~~ SOLN: 8.0000 [IU] | Freq: Three times a day (TID) | SUBCUTANEOUS | Status: DC

## 2013-11-10 MED ORDER — AMOXICILLIN-POT CLAVULANATE 875-125 MG PO TABS: 1.0000 | ORAL_TABLET | Freq: Two times a day (BID) | ORAL | Status: DC

## 2013-11-10 MED ORDER — HYDRALAZINE HCL 25 MG PO TABS: 25.0000 mg | ORAL_TABLET | Freq: Three times a day (TID) | ORAL | Status: DC

## 2013-11-10 MED ORDER — PREDNISONE 50 MG PO TABS: ORAL_TABLET | ORAL | Status: DC

## 2013-11-10 NOTE — Progress Notes (Signed)
Patient ID: OSYRIS VOLLRATH, male   DOB: July 26, 1950, 63 y.o.   MRN: [id number]    Chief Complaint  Patient presents with  . Medical Management of Chronic Issues    1 month follow-up on blood pressure and blood sugar   . URI    Congestion, slight cough, headache,, and low grade fever (off/on). Seen at  Urgent Care    No Known Allergies  HPI 63 y/o male patient is here for follow up visit. He has DM and HTN. His home bp has been between 130-150 SBP and DBP < 80. His cbg reviewed. 150-300s. premeal aspart not used by patient. He has been having runny nose with cough and chest congestion for a week. He was put on keflex in urgent care and feels somewhat better but now feels congested in his chest and has wheezing. He has hx of asthma and is using his nebulizer for past 5 days    Review of Systems  Constitutional: denies fever, chills but feels tired  HENT: Negative for sore throat.   Respiratory: Negative for shortness of breath. Has wheezing.   Cardiovascular: Negative for chest pain, palpitations Gastrointestinal: Negative for heartburn, nausea, vomiting, abdominal pain  Genitourinary: Negative for dysuria  Musculoskeletal: Negative for back pain, falls   Skin: Negative for itching and rash.  Neurological: Negative for dizziness, tingling, focal weakness and headaches.  Psychiatric/Behavioral: Negative for depression   Past Medical History  Diagnosis Date  . Diabetes mellitus   . Allergy   . Asthma   . Hypertension   . Anemia   . Anxiety   . Hyperlipidemia   . Problems with sexual function    Current Outpatient Prescriptions on File Prior to Visit  Medication Sig Dispense Refill  . albuterol (PROAIR HFA) 108 (90 BASE) MCG/ACT inhaler Inhale two puffs every 6 hours as needed for shortness of breath  3 Inhaler  1  . albuterol (PROVENTIL) (2.5 MG/3ML) 0.083% nebulizer solution USE 1 INHALATION SOLUTION PER NEBULIZER EVERY 6-8 HOURS  3 mL  5  . AMBULATORY NON FORMULARY  MEDICATION One Touch Test Strips Test blood sugar three time daily for blood sugar Dx: 250.00  100 strip  3  . amLODipine (NORVASC) 10 MG tablet TAKE ONE TABLET BY MOUTH ONCE DAILY  30 tablet  3  . aspirin EC 81 MG tablet Take 81 mg by mouth daily.      Marland Kitchen atorvastatin (LIPITOR) 10 MG tablet Take 1 tablet (10 mg total) by mouth daily.  30 tablet  6  . BD PEN NEEDLE NANO U/F 32G X 4 MM MISC       . benazepril (LOTENSIN) 40 MG tablet TAKE ONE TABLET BY MOUTH ONCE DAILY FOR BLOOD PRESSURE  30 tablet  3  . chlorthalidone (HYGROTON) 25 MG tablet Take 2 tablets (50 mg total) by mouth daily.  180 tablet  1  . Cholecalciferol ([employer] VITAMIN D3) 1000 UNITS capsule Take 2 capsules (2,000 Units total) by mouth daily.  60 capsule  3  . Coenzyme Q10 (CO Q 10 PO) Take 1 tablet by mouth daily.      . Fluticasone-Salmeterol (ADVAIR DISKUS) 100-50 MCG/DOSE AEPB INHALE ONE DOSE BY MOUTH TWICE DAILY  60 each  5  . insulin detemir (LEVEMIR) 100 UNIT/ML injection Inject 0.5 mLs (50 Units total) into the skin 2 (two) times daily.  30 mL  11  . linagliptin (TRADJENTA) 5 MG TABS tablet Take 1 tablet (5 mg total) by mouth daily.  [place]  tablet  5  . Syringe, Disposable, 3 ML MISC Use syringe daily with Humalog, DX:250.00  200 each  5  . terbinafine (LAMISIL) 1 % cream Apply 1 application topically daily as needed.       No current facility-administered medications on file prior to visit.   Physical exam BP 168/84  Pulse 90  Temp(Src) 97.4 F (36.3 C) (Oral)  Resp 10  Wt 257 lb (116.574 kg)  SpO2 98%  General- elderly male in no acute distress, obese Head- atraumatic, normocephalic Eyes- PERRLA, EOMI, no pallor, no icterus, no discharge Ears- left ear normal tympanic membrane and normal external ear canal , right ear normal tympanic membrane and normal external ear canal Neck- no lymphadenopathy, no thyromegaly, no jugular vein distension, no carotid bruit Mouth- normal mucus membrane, oropharyngeal  erythema Cardiovascular- normal s1,s2, no murmurs, normal distal pulses Respiratory- bilateral decreased air entry with wheezing present Abdomen- bowel sounds present, soft, non tender Musculoskeletal- able to move all 4 extremities, no leg edema Neurological- no focal deficit, normal reflexes except diminished right knee reflex, normal muscle strength, normal sensation to fine touch and vibration Skin- warm and dry Psychiatry- alert and oriented to person, place and time, normal mood and affect   Lab Results  Component Value Date   HGBA1C 11.3* 10/06/2013   Assessment/plan  1. Acute bronchitis, unspecified organism Start augmentin 875 mg bid for 10 days. Take prn mucinex and get cxr to rule out pneumonia if no clinical improvement noted. Monitor for fever  2. Asthma with acute exacerbation, mild intermittent Continue prn albuterol nebs and add prednisone 50 mg daily for acute exacerbation  3. Essential hypertension Increase hydralazine to 25 mg tid. Continue chlorthalidone 50 mg daily, amlodipine 10 mg daily and benazepril 40 mg daily. Monitor bp readings at home - CMP; Future - Hemoglobin A1c; Future - Lipid Panel; Future  4. DM type 2, uncontrolled, with renal complications Continue levemir 50 u bid and take aspart 8 u for cbg > 180 with meals - Hemoglobin A1c; Future - Lipid Panel; Future

## 2013-11-17 DIAGNOSIS — H2513 Age-related nuclear cataract, bilateral: Secondary | ICD-10-CM | POA: Diagnosis not present

## 2013-11-17 DIAGNOSIS — Z794 Long term (current) use of insulin: Secondary | ICD-10-CM | POA: Diagnosis not present

## 2013-11-17 DIAGNOSIS — E119 Type 2 diabetes mellitus without complications: Secondary | ICD-10-CM | POA: Diagnosis not present

## 2013-11-17 LAB — HM DIABETES EYE EXAM

## 2013-12-22 ENCOUNTER — Ambulatory Visit (INDEPENDENT_AMBULATORY_CARE_PROVIDER_SITE_OTHER): Payer: Medicare Other | Admitting: Internal Medicine

## 2013-12-22 ENCOUNTER — Encounter: Payer: Self-pay | Admitting: Internal Medicine

## 2013-12-22 VITALS — BP 140/80 | HR 100 | Temp 98.1°F | Ht 73.0 in | Wt 256.2 lb

## 2013-12-22 DIAGNOSIS — J441 Chronic obstructive pulmonary disease with (acute) exacerbation: Secondary | ICD-10-CM | POA: Diagnosis not present

## 2013-12-22 DIAGNOSIS — J0100 Acute maxillary sinusitis, unspecified: Secondary | ICD-10-CM

## 2013-12-22 MED ORDER — PREDNISONE 50 MG PO TABS: ORAL_TABLET | ORAL | Status: DC

## 2013-12-22 MED ORDER — AMOXICILLIN-POT CLAVULANATE 875-125 MG PO TABS: 1.0000 | ORAL_TABLET | Freq: Two times a day (BID) | ORAL | Status: DC

## 2013-12-22 NOTE — Progress Notes (Signed)
Patient ID: Henry Carroll, male   DOB: [birth year], 63 y.o.   MRN: [id number]    Chief Complaint  Patient presents with  . Acute Visit    Respiratory Issues X 4 days, pt. also complaining of earache (both), and dry cough   No Known Allergies  HPI 63 y/o pt with copd here for acute visit. He has been having cough for 4 days with chest and sinus congestion, he has runny nose with yellow mucus, increased cough and gets SOB with moderate exertion. He is using his bronchodilator treatment. Has been feeling feverish. Denies chills. Has also been having earache and throat discomfort  ROS Denies nausea and vomiting Denies abdominal pain, diarrhea Denies urinary complaints cbg 130s fasting and post meal 160-180s Denies chest pain and palpitations Appetite has been fair Feels tired  Past Medical History  Diagnosis Date  . Diabetes mellitus   . Allergy   . Asthma   . Hypertension   . Anemia   . Anxiety   . Hyperlipidemia   . Problems with sexual function    Medication reviewed. See [facility]  Physical exam BP 140/80 mmHg  Pulse 100  Temp(Src) 98.1 F (36.7 C) (Oral)  Ht 6\' 1"  (1.854 m)  Wt 256 lb 3.2 oz (116.212 kg)  BMI 33.81 kg/m2  SpO2 93%  General- elderly male in no acute distress, obese Head- atraumatic, normocephalic Eyes- PERRLA, EOMI, no pallor, no icterus, no discharge Ears- left ear normal tympanic membrane and normal external ear canal , right ear normal tympanic membrane and normal external ear canal Nose- maxillary sinus tenderness Neck- no lymphadenopathy Mouth- normal mucus membrane, oropharyngeal erythema Cardiovascular- normal s1,s2, no murmurs Respiratory- bilateral poor air entry with scattered rhonchi, no wheeze, no crackles Abdomen- bowel sounds present, soft, non tender Musculoskeletal- able to move all 4 extremities Skin- warm and dry Psychiatry- alert and oriented to person, place and time, normal mood and affect  Assessment/plan  1. COPD  exacerbation Increased cough and dyspnea with exertion and has hx of copd. Start empirically with augnmentin 875 mg bid and prednisone 50 mg daily x 5 days. To use his duoneb qid for 5 days and then prn. Get chest xray prior to next visit or earlier if symptoms worsen. Consider immunization next visit and also consider phosphodiesterase inhibitor  - DG Chest 2 View; Future  2. Acute maxillary sinusitis, recurrence not specified Start augmentin 875 mg q12 h x 2 weeks, encouraged rest, hydration, prn tylenol for fever and abstaining from tobacco/cigar use

## 2014-01-03 ENCOUNTER — Other Ambulatory Visit: Payer: Self-pay | Admitting: Internal Medicine

## 2014-01-05 ENCOUNTER — Ambulatory Visit (INDEPENDENT_AMBULATORY_CARE_PROVIDER_SITE_OTHER): Payer: Medicare Other | Admitting: Internal Medicine

## 2014-01-05 ENCOUNTER — Ambulatory Visit (INDEPENDENT_AMBULATORY_CARE_PROVIDER_SITE_OTHER): Payer: Medicare Other | Admitting: *Deleted

## 2014-01-05 ENCOUNTER — Encounter: Payer: Self-pay | Admitting: Internal Medicine

## 2014-01-05 VITALS — BP 142/78 | HR 86 | Temp 97.6°F | Resp 18 | Ht 73.0 in | Wt 251.2 lb

## 2014-01-05 DIAGNOSIS — Z23 Encounter for immunization: Secondary | ICD-10-CM | POA: Diagnosis not present

## 2014-01-05 DIAGNOSIS — J4520 Mild intermittent asthma, uncomplicated: Secondary | ICD-10-CM

## 2014-01-05 MED ORDER — INSULIN DETEMIR 100 UNIT/ML ~~LOC~~ SOLN: 50.0000 [IU] | Freq: Two times a day (BID) | SUBCUTANEOUS | Status: DC

## 2014-01-05 MED ORDER — HYDRALAZINE HCL 25 MG PO TABS: 25.0000 mg | ORAL_TABLET | Freq: Three times a day (TID) | ORAL | Status: DC

## 2014-01-05 MED ORDER — MONTELUKAST SODIUM 10 MG PO TABS: 10.0000 mg | ORAL_TABLET | Freq: Every day | ORAL | Status: DC

## 2014-01-05 NOTE — Progress Notes (Signed)
Patient ID: Henry Carroll, male   DOB: February 24, 1950, 62 y.o.   MRN: [id number]    Chief Complaint  Patient presents with  . Follow-up    asthma exacerbation   No Known Allergies  HPI 63 y/o male pt is here for follow up on his asthma exacerbation. He has completed his augmentin and prednsione course, using duoneb and advair for now. He has minimal dry cough, denies dyspnea and feels better overall. He still feels low in his energy. cbg have been better controlled after completion of prednsione. cbg this am 126. Complaint with other med  ROS Denies fever or chills Denies nausea or vomiting Denies runny nose and sore throat Breathing improved No chest pain  Past Medical History  Diagnosis Date  . Diabetes mellitus   . Allergy   . Asthma   . Hypertension   . Anemia   . Anxiety   . Hyperlipidemia   . Problems with sexual function    Current Outpatient Prescriptions on File Prior to Visit  Medication Sig Dispense Refill  . albuterol (PROAIR HFA) 108 (90 BASE) MCG/ACT inhaler Inhale two puffs every 6 hours as needed for shortness of breath 3 Inhaler 1  . albuterol (PROVENTIL) (2.5 MG/3ML) 0.083% nebulizer solution USE 1 INHALATION SOLUTION PER NEBULIZER EVERY 6-8 HOURS 3 mL 5  . AMBULATORY NON FORMULARY MEDICATION One Touch Test Strips Test blood sugar three time daily for blood sugar Dx: 250.00 100 strip 3  . amLODipine (NORVASC) 10 MG tablet TAKE ONE TABLET BY MOUTH ONCE DAILY 30 tablet 3  . aspirin EC 81 MG tablet Take 81 mg by mouth daily.    Marland Kitchen atorvastatin (LIPITOR) 10 MG tablet Take 1 tablet (10 mg total) by mouth daily. 30 tablet 6  . BD PEN NEEDLE NANO U/F 32G X 4 MM MISC     . benazepril (LOTENSIN) 40 MG tablet TAKE ONE TABLET BY MOUTH ONCE DAILY FOR BLOOD PRESSURE 30 tablet 3  . chlorthalidone (HYGROTON) 25 MG tablet Take 2 tablets (50 mg total) by mouth daily. 180 tablet 1  . Cholecalciferol ([employer]S VITAMIN D3) 1000 UNITS capsule Take 2 capsules (2,000 Units total) by  mouth daily. 60 capsule 3  . Coenzyme Q10 (CO Q 10 PO) Take 1 tablet by mouth daily.    . Fluticasone-Salmeterol (ADVAIR DISKUS) 100-50 MCG/DOSE AEPB INHALE ONE DOSE BY MOUTH TWICE DAILY 60 each 5  . linagliptin (TRADJENTA) 5 MG TABS tablet Take 1 tablet (5 mg total) by mouth daily. 30 tablet 5  . Syringe, Disposable, 3 ML MISC Use syringe daily with Humalog, DX:250.00 200 each 5  . terbinafine (LAMISIL) 1 % cream Apply 1 application topically daily as needed.    . insulin aspart (NOVOLOG) 100 UNIT/ML injection Inject 8 Units into the skin 3 (three) times daily with meals. Inject 8 u insulin only for blood sugar greater than 180. Do not skip your meals (Patient not taking: Reported on 01/05/2014) 3 vial PRN   No current facility-administered medications on file prior to visit.   Physical exam BP 142/78 mmHg  Pulse 86  Temp(Src) 97.6 F (36.4 C) (Oral)  Resp 18  Ht 6\' 1"  (1.854 m)  Wt 251 lb 3.2 oz (113.944 kg)  BMI 33.15 kg/m2  SpO2 97%  General- elderly male in no acute distress, obese Head- atraumatic, normocephalic Eyes- PERRLA, EOMI, no pallor, no icterus, no discharge Neck- no lymphadenopathy Cardiovascular- normal s1,s2, no murmurs Respiratory- bilateral good air entry, no wheezing or rhonchi Neurological-  no focal deficit Skin- warm and dry Psychiatry- alert and oriented to person, place and time, normal mood and affect  Assessment/plan  1. Asthma, chronic, mild intermittent, uncomplicated Continue prn duoneb with advair. Add singulair 10 mg daily from today. Monitor for early signs of exacerbation. Consider adding LAMA next  2. Immunization Influenza vaccine provided

## 2014-02-02 ENCOUNTER — Other Ambulatory Visit: Payer: Self-pay | Admitting: Internal Medicine

## 2014-02-03 ENCOUNTER — Other Ambulatory Visit: Payer: Federal, State, Local not specified - PPO

## 2014-02-03 DIAGNOSIS — E1129 Type 2 diabetes mellitus with other diabetic kidney complication: Secondary | ICD-10-CM | POA: Diagnosis not present

## 2014-02-03 DIAGNOSIS — I10 Essential (primary) hypertension: Secondary | ICD-10-CM

## 2014-02-03 DIAGNOSIS — E1165 Type 2 diabetes mellitus with hyperglycemia: Secondary | ICD-10-CM

## 2014-02-03 DIAGNOSIS — IMO0002 Reserved for concepts with insufficient information to code with codable children: Secondary | ICD-10-CM

## 2014-02-04 LAB — LIPID PANEL
CHOL/HDL RATIO: 5.4 ratio — AB (ref 0.0–5.0)
Cholesterol, Total: 228 mg/dL — ABNORMAL HIGH (ref 100–199)
HDL: 42 mg/dL (ref >39)
LDL Calculated: 155 mg/dL — ABNORMAL HIGH (ref 0–99)
TRIGLYCERIDES: 157 mg/dL — AB (ref 0–149)
VLDL Cholesterol Cal: 31 mg/dL (ref 5–40)

## 2014-02-04 LAB — COMPREHENSIVE METABOLIC PANEL
ALBUMIN: 4.1 g/dL (ref 3.6–4.8)
ALT: 35 IU/L (ref 0–44)
AST: 22 IU/L (ref 0–40)
Albumin/Globulin Ratio: 1.7 (ref 1.1–2.5)
Alkaline Phosphatase: 103 IU/L (ref 39–117)
BILIRUBIN TOTAL: 0.3 mg/dL (ref 0.0–1.2)
BUN/Creatinine Ratio: 23 — ABNORMAL HIGH (ref 10–22)
BUN: 36 mg/dL — AB (ref 8–27)
CO2: 21 mmol/L (ref 18–29)
Calcium: 9.3 mg/dL (ref 8.6–10.2)
Chloride: 100 mmol/L (ref 97–108)
Creatinine, Ser: 1.58 mg/dL — ABNORMAL HIGH (ref 0.76–1.27)
GFR calc non Af Amer: 45 mL/min/{1.73_m2} — ABNORMAL LOW (ref >59)
GFR, EST AFRICAN AMERICAN: 52 mL/min/{1.73_m2} — AB (ref >59)
GLUCOSE: 320 mg/dL — AB (ref 65–99)
Globulin, Total: 2.4 g/dL (ref 1.5–4.5)
Potassium: 5.5 mmol/L — ABNORMAL HIGH (ref 3.5–5.2)
Sodium: 134 mmol/L (ref 134–144)
TOTAL PROTEIN: 6.5 g/dL (ref 6.0–8.5)

## 2014-02-04 LAB — HEMOGLOBIN A1C
ESTIMATED AVERAGE GLUCOSE: 275 mg/dL
Hgb A1c MFr Bld: 11.2 % — ABNORMAL HIGH (ref 4.8–5.6)

## 2014-02-08 ENCOUNTER — Other Ambulatory Visit: Payer: Self-pay | Admitting: Internal Medicine

## 2014-02-09 ENCOUNTER — Ambulatory Visit (INDEPENDENT_AMBULATORY_CARE_PROVIDER_SITE_OTHER): Payer: Medicare Other | Admitting: Internal Medicine

## 2014-02-09 ENCOUNTER — Encounter: Payer: Self-pay | Admitting: Internal Medicine

## 2014-02-09 VITALS — BP 138/80 | HR 93 | Temp 97.6°F | Resp 10 | Ht 73.0 in | Wt 256.0 lb

## 2014-02-09 DIAGNOSIS — E1129 Type 2 diabetes mellitus with other diabetic kidney complication: Secondary | ICD-10-CM

## 2014-02-09 DIAGNOSIS — I10 Essential (primary) hypertension: Secondary | ICD-10-CM | POA: Diagnosis not present

## 2014-02-09 DIAGNOSIS — E785 Hyperlipidemia, unspecified: Secondary | ICD-10-CM

## 2014-02-09 DIAGNOSIS — D631 Anemia in chronic kidney disease: Secondary | ICD-10-CM

## 2014-02-09 DIAGNOSIS — N183 Chronic kidney disease, stage 3 unspecified: Secondary | ICD-10-CM

## 2014-02-09 DIAGNOSIS — N189 Chronic kidney disease, unspecified: Secondary | ICD-10-CM | POA: Diagnosis not present

## 2014-02-09 DIAGNOSIS — E1165 Type 2 diabetes mellitus with hyperglycemia: Secondary | ICD-10-CM

## 2014-02-09 DIAGNOSIS — Z23 Encounter for immunization: Secondary | ICD-10-CM | POA: Diagnosis not present

## 2014-02-09 DIAGNOSIS — J4520 Mild intermittent asthma, uncomplicated: Secondary | ICD-10-CM | POA: Diagnosis not present

## 2014-02-09 DIAGNOSIS — E875 Hyperkalemia: Secondary | ICD-10-CM

## 2014-02-09 DIAGNOSIS — IMO0002 Reserved for concepts with insufficient information to code with codable children: Secondary | ICD-10-CM

## 2014-02-09 MED ORDER — ATORVASTATIN CALCIUM 20 MG PO TABS: 10.0000 mg | ORAL_TABLET | Freq: Every day | ORAL | Status: DC

## 2014-02-09 MED ORDER — INSULIN DETEMIR 100 UNIT/ML ~~LOC~~ SOLN: 60.0000 [IU] | Freq: Two times a day (BID) | SUBCUTANEOUS | Status: DC

## 2014-02-09 NOTE — Progress Notes (Signed)
Patient ID: Henry Carroll, male   DOB: August 14, 1950, 63 y.o.   MRN: [id number]    Chief Complaint  Patient presents with  . Medical Management of Chronic Issues    3 month follow-up, discuss labs (copy printed). Patient c/o elevated BS, this am- 390 (fasting)   . Immunizations    Prevnar    No Known Allergies  HPI 63 y/o male patient is here for routine follow up. His cbg at home has been between 170-390 recently with 390 this am. bp reading at home is mostly 128-135/ 70-80. Some readings in 140s. His cough and chest congestion has resolved.  He has history of HTN, uncontrolled DM, HLD Labs reviewed with patient He has sedentary lifestyle, does not exercise and is making dietary changes   Review of Systems   Constitutional: denies fever, chills but feels tired   HENT: Negative for sore throat.    Respiratory: Negative for shortness of breath, cough and wheezing.    Cardiovascular: Negative for chest pain, palpitations, leg edema Gastrointestinal: Negative for heartburn, nausea, vomiting, abdominal pain   Genitourinary: Negative for dysuria   Musculoskeletal: Negative for back pain, falls    Skin: Negative for itching and rash.   Neurological: Negative for dizziness, tingling, focal weakness and headaches.   Psychiatric/Behavioral: Negative for depression   Past Medical History  Diagnosis Date  . Diabetes mellitus   . Allergy   . Asthma   . Hypertension   . Anemia   . Anxiety   . Hyperlipidemia   . Problems with sexual function    Past Surgical History  Procedure Laterality Date  . Tonsillectomy  1962  . Hernia repair  0000000    Umbilical  . Spine surgery  2006    L4 & L5  . Colon surgery  2011    Colonoscopy   Current Outpatient Prescriptions on File Prior to Visit  Medication Sig Dispense Refill  . albuterol (PROAIR HFA) 108 (90 BASE) MCG/ACT inhaler Inhale two puffs every 6 hours as needed for shortness of breath 3 Inhaler 1  . albuterol (PROVENTIL) (2.5  MG/3ML) 0.083% nebulizer solution USE 1 INHALATION SOLUTION PER NEBULIZER EVERY 6-8 HOURS 3 mL 5  . AMBULATORY NON FORMULARY MEDICATION One Touch Test Strips Test blood sugar three time daily for blood sugar Dx: 250.00 100 strip 3  . amLODipine (NORVASC) 10 MG tablet TAKE ONE TABLET BY MOUTH ONCE DAILY 30 tablet 5  . aspirin EC 81 MG tablet Take 81 mg by mouth daily.    . BD PEN NEEDLE NANO U/F 32G X 4 MM MISC     . benazepril (LOTENSIN) 40 MG tablet TAKE ONE TABLET BY MOUTH ONCE DAILY FOR BLOOD PRESSURE 30 tablet 5  . chlorthalidone (HYGROTON) 25 MG tablet Take 2 tablets (50 mg total) by mouth daily. 180 tablet 1  . Cholecalciferol (CVS VITAMIN D3) 1000 UNITS capsule Take 2 capsules (2,000 Units total) by mouth daily. 60 capsule 3  . Coenzyme Q10 (CO Q 10 PO) Take 1 tablet by mouth daily.    . Fluticasone-Salmeterol (ADVAIR DISKUS) 100-50 MCG/DOSE AEPB INHALE ONE DOSE BY MOUTH TWICE DAILY 60 each 5  . hydrALAZINE (APRESOLINE) 25 MG tablet Take 1 tablet (25 mg total) by mouth 3 (three) times daily. 180 tablet 5  . insulin aspart (NOVOLOG) 100 UNIT/ML injection Inject 8 Units into the skin 3 (three) times daily with meals. Inject 8 u insulin only for blood sugar greater than 180. Do not skip your meals  3 vial PRN  . linagliptin (TRADJENTA) 5 MG TABS tablet Take 1 tablet (5 mg total) by mouth daily. 30 tablet 5  . montelukast (SINGULAIR) 10 MG tablet Take 1 tablet (10 mg total) by mouth at bedtime. 30 tablet 3  . Syringe, Disposable, 3 ML MISC Use syringe daily with Humalog, DX:250.00 200 each 5  . terbinafine (LAMISIL) 1 % cream Apply 1 application topically daily as needed.     No current facility-administered medications on file prior to visit.    Physical exam BP 138/80 mmHg  Pulse 93  Temp(Src) 97.6 F (36.4 C) (Oral)  Resp 10  Ht 6\' 1"  (1.854 m)  Wt 256 lb (116.121 kg)  BMI 33.78 kg/m2  SpO2 96%  Wt Readings from Last 3 Encounters:  02/09/14 256 lb (116.121 kg)  01/05/14 251 lb  3.2 oz (113.944 kg)  12/22/13 256 lb 3.2 oz (116.212 kg)   General- elderly male in no acute distress, obese Head- atraumatic, normocephalic Eyes- PERRLA, EOMI, no pallor, no icterus, no discharge Neck- no lymphadenopathy, no thyromegaly, no jugular vein distension, no carotid bruit Mouth- normal mucus membrane Cardiovascular- normal s1,s2, no murmurs, normal distal pulses Respiratory- CTAB Abdomen- bowel sounds present, soft, non tender Musculoskeletal- able to move all 4 extremities, no leg edema Neurological- no focal deficit Skin- warm and dry Psychiatry- alert and oriented to person, place and time, normal mood and affect    Labs-  Lab Results  Component Value Date   HGBA1C 11.2* 02/03/2014   CBC Latest Ref Rng 05/04/2013 01/18/2012 12/16/2011  WBC 3.4 - 10.8 x10E3/uL 3.8 6.4 4.6  Hemoglobin 12.6 - 17.7 g/dL 10.6(L) 13.5(A) 13.3(A)  Hematocrit 37.5 - 51.0 % 31.0(L) 43.8 42.7(A)   CMP Latest Ref Rng 02/03/2014 10/06/2013 05/04/2013  Glucose 65 - 99 mg/dL 320(H) 266(H) 294(H)  BUN 8 - 27 mg/dL 36(H) 22 24  Creatinine 0.76 - 1.27 mg/dL 1.58(H) 1.37(H) 1.33(H)  Sodium 134 - 144 mmol/L 134 137 137  Potassium 3.5 - 5.2 mmol/L 5.5(H) 4.9 5.2  Chloride 97 - 108 mmol/L 100 101 100  CO2 18 - 29 mmol/L 21 25 21   Calcium 8.6 - 10.2 mg/dL 9.3 9.2 9.5  Total Protein 6.0 - 8.5 g/dL 6.5 5.8(L) 6.0  Albumin 3.6 - 4.8 g/dL 4.1 3.6 4.1  Total Bilirubin 0.0 - 1.2 mg/dL 0.3 <0.2 0.2  Alkaline Phos 39 - 117 IU/L 103 86 73  AST 0 - 40 IU/L 22 26 26   ALT 0 - 44 IU/L 35 35 36   Lipid Panel     Component Value Date/Time   TRIG 157* 02/03/2014 1402   HDL 42 02/03/2014 1402   CHOLHDL 5.4* 02/03/2014 1402   LDLCALC 155* 02/03/2014 1402    Assessment/plan   1. DM type 2, uncontrolled, with renal complications [id number] s/o uncontrolled dm. Increase levemir to 60 u bid and continue humalog 8 u for cbg > 180 with meals. Continue benazepril and statin with aspirin. Check urine microalbumin prior  to next visit. uptodate on foot and eye exam. counselling on calorie counting, portion control and exercise for 30 min 5 days a week provided. uptodate with influenza and pneumococcal vaccine, prevnar provided today  Immunization History  Administered Date(s) Administered  . Influenza,inj,Quad PF,36+ Mos 10/15/2012, 01/05/2014  . Influenza-Unspecified 02/04/2012  . Pneumococcal Polysaccharide-23 05/05/2013  . Tdap 03/26/2011   - CMP; Future - Hemoglobin A1c; Future - TSH; Future - Microalbumin/Creatinine Ratio, Urine; Future  2. Essential hypertension Continue lotensin, hydralazine, chlorthalidone and  norvasc current regimen, no changes made this visit - TSH; Future  3. Asthma, chronic, mild intermittent, uncomplicated Stable, continue proair and advair  4. CKD (chronic kidney disease) stage 3, GFR 30-59 ml/min Encouraged hydration, avoid nsaids - Microalbumin/Creatinine Ratio, Urine; Future  5. Anemia in chronic kidney disease Monitor h&h - TSH; Future  6. Hyperlipidemia with target LDL less than 100 Increase lipitor to 20 mg daily for now, dietary counselling provided  7. Hyperkalemia Recheck bmp today. His diet and impaired renal function likely contributing to this - Basic Metabolic Panel

## 2014-02-10 LAB — BASIC METABOLIC PANEL
BUN / CREAT RATIO: 22 (ref 10–22)
BUN: 36 mg/dL — ABNORMAL HIGH (ref 8–27)
CALCIUM: 9.6 mg/dL (ref 8.6–10.2)
CO2: 19 mmol/L (ref 18–29)
Chloride: 100 mmol/L (ref 97–108)
Creatinine, Ser: 1.61 mg/dL — ABNORMAL HIGH (ref 0.76–1.27)
GFR calc non Af Amer: 44 mL/min/{1.73_m2} — ABNORMAL LOW (ref >59)
GFR, EST AFRICAN AMERICAN: 51 mL/min/{1.73_m2} — AB (ref >59)
Glucose: 341 mg/dL — ABNORMAL HIGH (ref 65–99)
POTASSIUM: 4.6 mmol/L (ref 3.5–5.2)
Sodium: 136 mmol/L (ref 134–144)

## 2014-02-20 ENCOUNTER — Other Ambulatory Visit: Payer: Self-pay | Admitting: Internal Medicine

## 2014-02-22 ENCOUNTER — Other Ambulatory Visit: Payer: Self-pay | Admitting: Internal Medicine

## 2014-03-16 ENCOUNTER — Other Ambulatory Visit: Payer: Self-pay | Admitting: *Deleted

## 2014-03-16 MED ORDER — ATORVASTATIN CALCIUM 20 MG PO TABS: ORAL_TABLET | ORAL | Status: DC

## 2014-03-16 MED ORDER — INSULIN DETEMIR 100 UNIT/ML ~~LOC~~ SOLN: 60.0000 [IU] | Freq: Two times a day (BID) | SUBCUTANEOUS | Status: DC

## 2014-03-16 NOTE — Telephone Encounter (Signed)
Patient requested to be faxed to pharmacy 

## 2014-04-06 ENCOUNTER — Ambulatory Visit (INDEPENDENT_AMBULATORY_CARE_PROVIDER_SITE_OTHER): Payer: Medicare Other | Admitting: Internal Medicine

## 2014-04-06 ENCOUNTER — Encounter: Payer: Self-pay | Admitting: Internal Medicine

## 2014-04-06 VITALS — BP 140/72 | HR 100 | Temp 98.1°F | Resp 20 | Ht 73.0 in | Wt 255.0 lb

## 2014-04-06 DIAGNOSIS — H65191 Other acute nonsuppurative otitis media, right ear: Secondary | ICD-10-CM

## 2014-04-06 MED ORDER — AMOXICILLIN 500 MG PO CAPS: 500.0000 mg | ORAL_CAPSULE | Freq: Three times a day (TID) | ORAL | Status: DC

## 2014-04-06 MED ORDER — ALBUTEROL SULFATE (2.5 MG/3ML) 0.083% IN NEBU: 2.5000 mg | INHALATION_SOLUTION | Freq: Four times a day (QID) | RESPIRATORY_TRACT | Status: DC | PRN

## 2014-04-06 MED ORDER — LINAGLIPTIN 5 MG PO TABS: 5.0000 mg | ORAL_TABLET | Freq: Every day | ORAL | Status: DC

## 2014-04-06 NOTE — Addendum Note (Signed)
Addended by: Logan Bores on: 04/06/2014 03:20 PM   Modules accepted: Orders

## 2014-04-06 NOTE — Progress Notes (Signed)
Patient ID: PLEASANT Henry Carroll, male   DOB: Apr 13, 1950, 64 y.o.   MRN: [id number]    Chief Complaint  Patient presents with  . Acute Visit    right ear pain   No Known Allergies  HPI 64 y/o pt here with acute concerns Was babysitting his grand kids this Saturday and they had runny nose. That evening he developed right ear pain. He felt stuffed up in his nose. He also developed cough that day which is mostly dry.  Had runny nose with clear discharge which has resolved. He feels stuffed up in his right nostril Denies chest congestion or chest pain No fever but has chills Denies any ear discharge Denies sore throat but has discomfort at back of throat  ROS No chest pain or dyspnea cbg 160-200 No dizziness or headache or focal weakness No urinary or bowel complaints  Past Medical History  Diagnosis Date  . Diabetes mellitus   . Allergy   . Asthma   . Hypertension   . Anemia   . Anxiety   . Hyperlipidemia   . Problems with sexual function    Medication reviewed. See [facility]  Physical exam BP 140/72 mmHg  Pulse 100  Temp(Src) 98.1 F (36.7 C) (Oral)  Resp 20  Ht 6\' 1"  (1.854 m)  Wt 255 lb (115.667 kg)  BMI 33.65 kg/m2  SpO2 95%  General- elderly male in no acute distress, obese Head- atraumatic, normocephalic Eyes- PERRLA, EOMI, no pallor, no icterus, no discharge Neck- no lymphadenopathy, no thyromegaly, no jugular vein distension, no carotid bruit Nose- no sinus tenderness Ears- normal ear canal, no cerumen, no signs of otitis externa, whitish bulging right ear tympanic membrane, no erythema noted Mouth- normal mucus membrane, oropharyngeal erythema, no exudates Cardiovascular- normal s1,s2, no murmurs Respiratory- CTAB Skin- warm and dry Psychiatry- alert and oriented to person, place and time, normal mood and affect  Assessment/plan  1. Acute nonsuppurative otitis media of right ear Treat with amoxicillin 500 mg tid for 10 days with his clinical symptom and  hx of DM. Monitor clinically. Can take tylenol prn for pain. Avoid NSAIDs with impaired kidney function. Lab Results  Component Value Date   CREATININE 1.61* 02/09/2014

## 2014-04-13 ENCOUNTER — Other Ambulatory Visit: Payer: Self-pay | Admitting: Internal Medicine

## 2014-04-13 ENCOUNTER — Encounter: Payer: Self-pay | Admitting: Internal Medicine

## 2014-05-12 ENCOUNTER — Other Ambulatory Visit: Payer: Self-pay | Admitting: Internal Medicine

## 2014-05-18 ENCOUNTER — Other Ambulatory Visit: Payer: Medicare Other

## 2014-05-19 ENCOUNTER — Other Ambulatory Visit: Payer: Federal, State, Local not specified - PPO

## 2014-05-19 DIAGNOSIS — D631 Anemia in chronic kidney disease: Secondary | ICD-10-CM

## 2014-05-19 DIAGNOSIS — E1129 Type 2 diabetes mellitus with other diabetic kidney complication: Secondary | ICD-10-CM

## 2014-05-19 DIAGNOSIS — N189 Chronic kidney disease, unspecified: Secondary | ICD-10-CM | POA: Diagnosis not present

## 2014-05-19 DIAGNOSIS — N183 Chronic kidney disease, stage 3 unspecified: Secondary | ICD-10-CM

## 2014-05-19 DIAGNOSIS — E1165 Type 2 diabetes mellitus with hyperglycemia: Secondary | ICD-10-CM | POA: Diagnosis not present

## 2014-05-19 DIAGNOSIS — I10 Essential (primary) hypertension: Secondary | ICD-10-CM

## 2014-05-19 DIAGNOSIS — IMO0002 Reserved for concepts with insufficient information to code with codable children: Secondary | ICD-10-CM

## 2014-05-20 LAB — COMPREHENSIVE METABOLIC PANEL
A/G RATIO: 1.9 (ref 1.1–2.5)
ALT: 39 IU/L (ref 0–44)
AST: 38 IU/L (ref 0–40)
Albumin: 3.7 g/dL (ref 3.6–4.8)
Alkaline Phosphatase: 90 IU/L (ref 39–117)
BUN / CREAT RATIO: 18 (ref 10–22)
BUN: 26 mg/dL (ref 8–27)
Bilirubin Total: <0.2 mg/dL (ref 0.0–1.2)
CHLORIDE: 103 mmol/L (ref 97–108)
CO2: 23 mmol/L (ref 18–29)
Calcium: 8.8 mg/dL (ref 8.6–10.2)
Creatinine, Ser: 1.42 mg/dL — ABNORMAL HIGH (ref 0.76–1.27)
GFR calc Af Amer: 59 mL/min/{1.73_m2} — ABNORMAL LOW (ref >59)
GFR calc non Af Amer: 51 mL/min/{1.73_m2} — ABNORMAL LOW (ref >59)
GLOBULIN, TOTAL: 1.9 g/dL (ref 1.5–4.5)
Glucose: 178 mg/dL — ABNORMAL HIGH (ref 65–99)
Potassium: 4.6 mmol/L (ref 3.5–5.2)
Sodium: 142 mmol/L (ref 134–144)
TOTAL PROTEIN: 5.6 g/dL — AB (ref 6.0–8.5)

## 2014-05-20 LAB — HEMOGLOBIN A1C
Est. average glucose Bld gHb Est-mCnc: 246 mg/dL
Hgb A1c MFr Bld: 10.2 % — ABNORMAL HIGH (ref 4.8–5.6)

## 2014-05-20 LAB — TSH: TSH: 0.927 u[IU]/mL (ref 0.450–4.500)

## 2014-05-25 ENCOUNTER — Encounter: Payer: Medicare Other | Admitting: Internal Medicine

## 2014-05-26 ENCOUNTER — Ambulatory Visit (INDEPENDENT_AMBULATORY_CARE_PROVIDER_SITE_OTHER): Payer: Medicare Other | Admitting: Internal Medicine

## 2014-05-26 ENCOUNTER — Encounter: Payer: Self-pay | Admitting: Internal Medicine

## 2014-05-26 VITALS — BP 138/80 | HR 96 | Temp 98.3°F | Resp 20 | Ht 73.0 in | Wt 259.8 lb

## 2014-05-26 DIAGNOSIS — Z125 Encounter for screening for malignant neoplasm of prostate: Secondary | ICD-10-CM

## 2014-05-26 DIAGNOSIS — I10 Essential (primary) hypertension: Secondary | ICD-10-CM | POA: Diagnosis not present

## 2014-05-26 DIAGNOSIS — E785 Hyperlipidemia, unspecified: Secondary | ICD-10-CM | POA: Diagnosis not present

## 2014-05-26 DIAGNOSIS — J4520 Mild intermittent asthma, uncomplicated: Secondary | ICD-10-CM

## 2014-05-26 DIAGNOSIS — IMO0002 Reserved for concepts with insufficient information to code with codable children: Secondary | ICD-10-CM

## 2014-05-26 DIAGNOSIS — E1165 Type 2 diabetes mellitus with hyperglycemia: Secondary | ICD-10-CM

## 2014-05-26 DIAGNOSIS — E1129 Type 2 diabetes mellitus with other diabetic kidney complication: Secondary | ICD-10-CM

## 2014-05-26 MED ORDER — FLUTICASONE-SALMETEROL 100-50 MCG/DOSE IN AEPB: INHALATION_SPRAY | RESPIRATORY_TRACT | Status: DC

## 2014-05-26 NOTE — Progress Notes (Signed)
Patient ID: Henry Carroll, male   DOB: 1950/05/02, 64 y.o.   MRN: [id number]    Facility  [facility]    Place of Service:   OFFICE    No Known Allergies  Chief Complaint  Patient presents with  . Medical Management of Chronic Issues    HPI:  64 yo male seen today for f/u. He c/o seasonal allergy sx's. He takes singulair, claritin and flonase. Asthma sx's with change in weather. He uses neb 3-4 times per week. He had one bad day earlier this month when he had SOB during the day but it resolved over night.   DM - BS fluctuating at home (70-230s). He is taking tradjenta, levemir 60 units BID and novolog 8 units TID qca if BS >180. No numbness/tingling in hands/feet.   Hyperlipidemia - he has myalgias on lipitor 20mg . He takes tsp yellow mustard which helps leg cramps   BP is stable on meds. He takes hydralazine, benazepril, chlorthalidone and amlodipine.  Anxiety well controlled. He does not take any meds  Past Medical History  Diagnosis Date  . Diabetes mellitus   . Allergy   . Asthma   . Hypertension   . Anemia   . Anxiety   . Hyperlipidemia   . Problems with sexual function    Past Surgical History  Procedure Laterality Date  . Tonsillectomy  1962  . Hernia repair  0000000    Umbilical  . Spine surgery  2006    L4 & L5  . Colon surgery  2011    Colonoscopy   History   Social History  . Marital Status: Married    Spouse Name: N/A  . Number of Children: N/A  . Years of Education: N/A   Social History Main Topics  . Smoking status: Current Some Day Smoker    Types: Cigars  . Smokeless tobacco: Not on file  . Alcohol Use: 0.0 oz/week    0 Standard drinks or equivalent per week     Comment: socailly  . Drug Use: No  . Sexual Activity: Yes    Birth Control/ Protection: None   Other Topics Concern  . None   Social History Narrative     Medications: Patient's Medications  New Prescriptions   No medications on file  Previous Medications   ALBUTEROL  (PROVENTIL) (2.5 MG/3ML) 0.083% NEBULIZER SOLUTION    Take 3 mLs (2.5 mg total) by nebulization every 6 (six) hours as needed for wheezing or shortness of breath.   AMBULATORY NON FORMULARY MEDICATION    One Touch Test Strips Test blood sugar three time daily for blood sugar Dx: 250.00   AMLODIPINE (NORVASC) 10 MG TABLET    Take 10 mg by mouth daily.   AMOXICILLIN (AMOXIL) 500 MG CAPSULE    Take 1 capsule (500 mg total) by mouth 3 (three) times daily. DX H65.191   ASPIRIN EC 81 MG TABLET    Take 81 mg by mouth daily.   ATORVASTATIN (LIPITOR) 20 MG TABLET    Take 1/2 tablet by mouth once daily for cholesterol   BD PEN NEEDLE NANO U/F 32G X 4 MM MISC       BENAZEPRIL (LOTENSIN) 40 MG TABLET    TAKE ONE TABLET BY MOUTH ONCE DAILY FOR BLOOD PRESSURE   CHLORTHALIDONE (HYGROTON) 25 MG TABLET    Take 2 tablets (50 mg total) by mouth daily.   CHOLECALCIFEROL ([employer] VITAMIN D3) 1000 UNITS CAPSULE    Take 2 capsules (2,000 Units  total) by mouth daily.   COENZYME Q10 (CO Q 10 PO)    Take 1 tablet by mouth daily.   FLUTICASONE-SALMETEROL (ADVAIR DISKUS) 100-50 MCG/DOSE AEPB    INHALE ONE DOSE BY MOUTH TWICE DAILY   HYDRALAZINE (APRESOLINE) 25 MG TABLET    Take 1 tablet (25 mg total) by mouth 3 (three) times daily.   INSULIN ASPART (NOVOLOG) 100 UNIT/ML INJECTION    Inject 8 Units into the skin 3 (three) times daily with meals. Inject 8 u insulin only for blood sugar greater than 180. Do not skip your meals   INSULIN DETEMIR (LEVEMIR) 100 UNIT/ML INJECTION    Inject 0.6 mLs (60 Units total) into the skin 2 (two) times daily.   LINAGLIPTIN (TRADJENTA) 5 MG TABS TABLET    Take 1 tablet (5 mg total) by mouth daily.   MONTELUKAST (SINGULAIR) 10 MG TABLET    TAKE ONE TABLET BY MOUTH AT BEDTIME   PROAIR HFA 108 (90 BASE) MCG/ACT INHALER    INHALE TWO PUFFS BY MOUTH EVERY 6 HOURS AS NEEDED FOR SHORTNESS OF BREATH   SYRINGE, DISPOSABLE, 3 ML MISC    Use syringe daily with Humalog, DX:250.00   TERBINAFINE (LAMISIL) 1 %  CREAM    Apply 1 application topically daily as needed.  Modified Medications   No medications on file  Discontinued Medications   No medications on file     Review of Systems  Constitutional: Negative for chills, activity change and fatigue.  HENT: Negative for sore throat and trouble swallowing.   Eyes: Negative for visual disturbance.  Respiratory: Negative for cough, chest tightness and shortness of breath.   Cardiovascular: Negative for chest pain, palpitations and leg swelling.  Gastrointestinal: Negative for nausea, vomiting, abdominal pain and blood in stool.  Genitourinary: Negative for urgency, frequency and difficulty urinating.  Musculoskeletal: Negative for arthralgias and gait problem.  Skin: Negative for rash.  Neurological: Negative for weakness and headaches.  Psychiatric/Behavioral: Negative for confusion and sleep disturbance. The patient is not nervous/anxious.     Filed Vitals:   05/26/14 1457  BP: 138/80  Pulse: 96  Temp: 98.3 F (36.8 C)  TempSrc: Oral  Resp: 20  Height: 6\' 1"  (1.854 m)  Weight: 259 lb 12.8 oz (117.845 kg)  SpO2: 96%   Body mass index is 34.28 kg/(m^2).  Physical Exam  Constitutional: He is oriented to person, place, and time. He appears well-developed and well-nourished.  HENT:  Mouth/Throat: Oropharynx is clear and moist.  Eyes: Pupils are equal, round, and reactive to light. No scleral icterus.  Neck: Neck supple. No thyromegaly present.  Cardiovascular: Normal rate, regular rhythm, normal heart sounds and intact distal pulses.  Exam reveals no gallop and no friction rub.   No murmur heard. No carotid bruit b/l; no distal LE swelling  Pulmonary/Chest: Effort normal and breath sounds normal. He has no wheezes. He has no rales. He exhibits no tenderness.  Abdominal: Soft. Bowel sounds are normal. He exhibits no distension, no abdominal bruit, no pulsatile midline mass and no mass. There is no tenderness. There is no rebound and no  guarding.  Lymphadenopathy:    He has no cervical adenopathy.  Neurological: He is alert and oriented to person, place, and time. He has normal reflexes.  Skin: Skin is warm and dry. No rash noted.  Psychiatric: He has a normal mood and affect. His behavior is normal. Judgment and thought content normal.     Labs reviewed: Lab on 05/19/2014  Component  Date Value Ref Range Status  . Hgb A1c MFr Bld 05/19/2014 10.2* 4.8 - 5.6 % Final   Comment:          Pre-diabetes: 5.7 - 6.4          Diabetes: >6.4          Glycemic control for adults with diabetes: <7.0   . Est. average glucose Bld gHb Est-m* 05/19/2014 246   Final  . TSH 05/19/2014 0.927  0.450 - 4.500 uIU/mL Final  . Glucose 05/19/2014 178* 65 - 99 mg/dL Final  . BUN 05/19/2014 26  8 - 27 mg/dL Final  . Creatinine, Ser 05/19/2014 1.42* 0.76 - 1.27 mg/dL Final  . GFR calc non Af Amer 05/19/2014 51* >59 mL/min/1.73 Final  . GFR calc Af Amer 05/19/2014 59* >59 mL/min/1.73 Final  . BUN/Creatinine Ratio 05/19/2014 18  10 - 22 Final  . Sodium 05/19/2014 142  134 - 144 mmol/L Final  . Potassium 05/19/2014 4.6  3.5 - 5.2 mmol/L Final  . Chloride 05/19/2014 103  97 - 108 mmol/L Final  . CO2 05/19/2014 23  18 - 29 mmol/L Final  . Calcium 05/19/2014 8.8  8.6 - 10.2 mg/dL Final  . Total Protein 05/19/2014 5.6* 6.0 - 8.5 g/dL Final  . Albumin 05/19/2014 3.7  3.6 - 4.8 g/dL Final  . Globulin, Total 05/19/2014 1.9  1.5 - 4.5 g/dL Final  . Albumin/Globulin Ratio 05/19/2014 1.9  1.1 - 2.5 Final  . Bilirubin Total 05/19/2014 <0.2  0.0 - 1.2 mg/dL Final  . Alkaline Phosphatase 05/19/2014 90  39 - 117 IU/L Final  . AST 05/19/2014 38  0 - 40 IU/L Final  . ALT 05/19/2014 39  0 - 44 IU/L Final     Assessment/Plan   ICD-9-CM ICD-10-CM   1. DM type 2, uncontrolled, with renal complications - BS still uncontrolled; on levemir and Novolog  qac and tradjenta 250.42 [id number] Basic Metabolic Panel    123456 ALT     Hemoglobin A1c  2. Essential  hypertension - stable; cont meds 401.9 I10   3. Asthma, chronic, mild intermittent, uncomplicated- stable; cont meds 493.90 J45.20   4. Hyperlipidemia with target LDL less than 100 - stable; cont meds 272.4 E78.5 Lipid Panel  5. Prostate cancer screening V76.44 Z12.5 PSA   --increase levemir 66 units qhs. Continue SSI novolog  --continue all other meds as ordered  --check PSA. Labs reviewed with pt  --RTO in 3 mos for CPE   [facility] S. Perlie Gold  [facility] [address] [place],  [address] [phone] Office (Wednesdays and Fridays 8 AM - 5 PM) [phone] Cell (Monday-Friday 8 AM - 5 PM)

## 2014-05-26 NOTE — Patient Instructions (Signed)
Increase levemir to 66 units in the morning and 60 units in the evening. Continue novolog as ordered  Follow up in 3 mos for CPE

## 2014-05-27 ENCOUNTER — Other Ambulatory Visit: Payer: Self-pay | Admitting: Internal Medicine

## 2014-06-17 ENCOUNTER — Other Ambulatory Visit: Payer: Self-pay | Admitting: Internal Medicine

## 2014-06-18 ENCOUNTER — Other Ambulatory Visit: Payer: Self-pay | Admitting: *Deleted

## 2014-06-18 MED ORDER — AMBULATORY NON FORMULARY MEDICATION: Status: DC

## 2014-06-18 NOTE — Telephone Encounter (Signed)
Patient requested to be faxed to pharmacy 

## 2014-07-01 ENCOUNTER — Other Ambulatory Visit: Payer: Self-pay | Admitting: Internal Medicine

## 2014-07-01 ENCOUNTER — Ambulatory Visit (INDEPENDENT_AMBULATORY_CARE_PROVIDER_SITE_OTHER): Payer: Medicare Other | Admitting: Internal Medicine

## 2014-07-01 ENCOUNTER — Telehealth: Payer: Self-pay | Admitting: *Deleted

## 2014-07-01 ENCOUNTER — Encounter: Payer: Self-pay | Admitting: Internal Medicine

## 2014-07-01 VITALS — BP 140/72 | HR 88 | Temp 97.3°F | Resp 20 | Ht 72.0 in | Wt 257.6 lb

## 2014-07-01 DIAGNOSIS — E1165 Type 2 diabetes mellitus with hyperglycemia: Secondary | ICD-10-CM | POA: Diagnosis not present

## 2014-07-01 DIAGNOSIS — E1129 Type 2 diabetes mellitus with other diabetic kidney complication: Secondary | ICD-10-CM | POA: Diagnosis not present

## 2014-07-01 DIAGNOSIS — L089 Local infection of the skin and subcutaneous tissue, unspecified: Secondary | ICD-10-CM | POA: Diagnosis not present

## 2014-07-01 DIAGNOSIS — L0232 Furuncle of buttock: Secondary | ICD-10-CM

## 2014-07-01 DIAGNOSIS — L739 Follicular disorder, unspecified: Secondary | ICD-10-CM

## 2014-07-01 DIAGNOSIS — L0233 Carbuncle of buttock: Secondary | ICD-10-CM

## 2014-07-01 DIAGNOSIS — IMO0002 Reserved for concepts with insufficient information to code with codable children: Secondary | ICD-10-CM

## 2014-07-01 DIAGNOSIS — L731 Pseudofolliculitis barbae: Secondary | ICD-10-CM | POA: Diagnosis not present

## 2014-07-01 DIAGNOSIS — L723 Sebaceous cyst: Secondary | ICD-10-CM | POA: Diagnosis not present

## 2014-07-01 MED ORDER — DOXYCYCLINE HYCLATE 100 MG PO TABS
100.0000 mg | ORAL_TABLET | Freq: Two times a day (BID) | ORAL | Status: DC
Start: 2014-07-01 — End: 2014-07-02

## 2014-07-01 NOTE — Progress Notes (Signed)
Patient ID: Henry Carroll, male   DOB: Jul 23, 1950, 64 y.o.   MRN: [id number]   Location:  [facility]   Chief Complaint  Patient presents with  . Acute Visit    Patient c/o Boil on bottom first noticed it last saturday    HPI: Patient is a 64 y.o. black maleseen in the office today for acute visit for "boil on his bottom".    Sat felt hard bump on his behind that was painful.  Didn't know what it was.  Wife checked and said it was a boil.  Is sleeping and sitting on a chuck and wants everything sanitized, washed and thrown out.  ? If needs abx, and his wife wondered if need to be debrided.  Web MD said hot compresses and he did that and a second place came up.    Notes glucose had been up for 9 mos, but gradually improving.  Readings recently have even been normal fasting at times.  Action level is 180.  Waking at times with 105.  Occasional "really bad" readings after meals.  Checking 4-5 times per day.  Has been very active exercises 4-5 times per week--golfing.  Is very fatigued when returns home.    Review of Systems:  Review of Systems  Constitutional: Negative for fever and chills.  Skin:       Boil on left lower buttock    Past Medical History  Diagnosis Date  . Diabetes mellitus   . Allergy   . Asthma   . Hypertension   . Anemia   . Anxiety   . Hyperlipidemia   . Problems with sexual function     Past Surgical History  Procedure Laterality Date  . Tonsillectomy  1962  . Hernia repair  0000000    Umbilical  . Spine surgery  2006    L4 & L5  . Colon surgery  2011    Colonoscopy    No Known Allergies Medications: Patient's Medications  New Prescriptions   No medications on file  Previous Medications   ALBUTEROL (PROVENTIL) (2.5 MG/3ML) 0.083% NEBULIZER SOLUTION    Take 3 mLs (2.5 mg total) by nebulization every 6 (six) hours as needed for wheezing or shortness of breath.   AMBULATORY NON FORMULARY MEDICATION    One  Touch Test Strips Test blood sugar three time daily for blood sugar Dx: 250.00   AMLODIPINE (NORVASC) 10 MG TABLET    Take 10 mg by mouth daily.   AMOXICILLIN (AMOXIL) 500 MG CAPSULE    Take 1 capsule (500 mg total) by mouth 3 (three) times daily. DX H65.191   ASPIRIN EC 81 MG TABLET    Take 81 mg by mouth daily.   ATORVASTATIN (LIPITOR) 20 MG TABLET    Take 1/2 tablet by mouth once daily for cholesterol   BD PEN NEEDLE NANO U/F 32G X 4 MM MISC       BENAZEPRIL (LOTENSIN) 40 MG TABLET    TAKE ONE TABLET BY MOUTH ONCE DAILY FOR BLOOD PRESSURE   CHLORTHALIDONE (HYGROTON) 25 MG TABLET    TAKE TWO TABLETS BY MOUTH ONCE DAILY   CHOLECALCIFEROL ([employer]S VITAMIN D3) 1000 UNITS CAPSULE    Take 2 capsules (2,000 Units total) by mouth daily.   COENZYME Q10 (CO Q 10 PO)    Take 1 tablet by mouth daily.   FLUTICASONE-SALMETEROL (ADVAIR DISKUS) 100-50 MCG/DOSE AEPB    INHALE ONE DOSE BY MOUTH TWICE DAILY  HYDRALAZINE (APRESOLINE) 25 MG TABLET    Take 1 tablet (25 mg total) by mouth 3 (three) times daily.   INSULIN ASPART (NOVOLOG) 100 UNIT/ML INJECTION    Inject 8 Units into the skin 3 (three) times daily with meals. Inject 8 u insulin only for blood sugar greater than 180. Do not skip your meals   INSULIN DETEMIR (LEVEMIR) 100 UNIT/ML INJECTION    Inject 0.6 mLs (60 Units total) into the skin 2 (two) times daily.   LINAGLIPTIN (TRADJENTA) 5 MG TABS TABLET    Take 1 tablet (5 mg total) by mouth daily.   MONTELUKAST (SINGULAIR) 10 MG TABLET    TAKE ONE TABLET BY MOUTH AT BEDTIME   PROAIR HFA 108 (90 BASE) MCG/ACT INHALER    INHALE TWO PUFFS BY MOUTH EVERY 6 HOURS AS NEEDED FOR SHORTNESS OF BREATH   SYRINGE, DISPOSABLE, 3 ML MISC    Use syringe daily with Humalog, DX:250.00   TERBINAFINE (LAMISIL) 1 % CREAM    Apply 1 application topically daily as needed.  Modified Medications   No medications on file  Discontinued Medications   No medications on file    Physical Exam: Filed Vitals:   07/01/14 1006  BP:  140/72  Pulse: 88  Temp: 97.3 F (36.3 C)  TempSrc: Oral  Resp: 20  Height: 6' (1.829 m)  Weight: 257 lb 9.6 oz (116.847 kg)  SpO2: 96%   Physical Exam  Constitutional: He is oriented to person, place, and time. He appears well-developed and well-nourished. No distress.  Musculoskeletal: Normal range of motion.  Neurological: He is alert and oriented to person, place, and time.  Skin:  Left lower buttock in area with small amount of hair, area of swelling the side of a half dollar, fluctuant, tender, warm, has an area that was previously opened, but has scabbed over    Labs reviewed: Basic Metabolic Panel:  Recent Labs  02/03/14 1402 02/09/14 1039 05/19/14 0952  NA 134 136 142  K 5.5* 4.6 4.6  CL 100 100 103  CO2 21 19 23   GLUCOSE 320* 341* 178*  BUN 36* 36* 26  CREATININE 1.58* 1.61* 1.42*  CALCIUM 9.3 9.6 8.8  TSH  --   --  0.927   Liver Function Tests:  Recent Labs  10/06/13 1146 02/03/14 1402 05/19/14 0952  AST 26 22 38  ALT 35 35 39  ALKPHOS 86 103 90  BILITOT <0.2 0.3 <0.2  PROT 5.8* 6.5 5.6*   No results for input(s): LIPASE, AMYLASE in the last 8760 hours. No results for input(s): AMMONIA in the last 8760 hours. CBC: No results for input(s): WBC, NEUTROABS, HGB, HCT, MCV, PLT in the last 8760 hours. Lipid Panel:  Recent Labs  10/06/13 1146 02/03/14 1402  CHOL 190 228*  HDL 46 42  LDLCALC 121* 155*  TRIG 117 157*  CHOLHDL 4.1 5.4*   Lab Results  Component Value Date   HGBA1C 10.2* 05/19/2014    Assessment/Plan 1. Folliculitis of perineum - noted previously--seems this is what his abscess of his left buttock stemmed from - doxycycline (VIBRA-TABS) 100 MG tablet; Take 1 tablet (100 mg total) by mouth 2 (two) times daily.  Dispense: 20 tablet; Refill: 0  2. Carbuncle and furuncle of buttock - doxycycline (VIBRA-TABS) 100 MG tablet; Take 1 tablet (100 mg total) by mouth 2 (two) times daily.  Dispense: 20 tablet; Refill: 0  -advised to  eat yogurt and drink plenty of fluids while he has this -advised  to see surgery this afternoon for debridement -his wife has a place she wants him to go for this and said she was going to give him the info when he gets home--to call us back if he needs a referral to [facility]  3. DM type 2, uncontrolled, with renal complications -discussed that poorly controlled glucose is contributing to his abscess and will interfere with the healing process   Labs/tests ordered:  None today Next appt:  Keep with Dr. Eulas Post, return prn  Debby Clyne L. Natina Wiginton, D.O. [facility] [address], [place] [address] Cell Phone (Mon-Fri 8am-5pm):  [phone] On Call:  [phone] & follow prompts after 5pm & weekends Office Phone:  [phone] Office Fax:  [phone]

## 2014-07-01 NOTE — Telephone Encounter (Signed)
Referral was placed 

## 2014-07-01 NOTE — Telephone Encounter (Signed)
Patient called and stated that he was seen today for a Boil and you told him to call back if he needed an appointment to [facility]. He called them and he does need one and stated that he wanted it ASAP. Please place order.

## 2014-07-01 NOTE — Patient Instructions (Addendum)
Continue warm compresses. Please see surgery who can debride the abscess.  I have faxed in a prescription for doxycycline for the infection. Eat yogurt while taking the antibiotic to help prevent diarrhea.  Drink plenty of water.

## 2014-07-02 ENCOUNTER — Other Ambulatory Visit: Payer: Self-pay | Admitting: *Deleted

## 2014-07-02 DIAGNOSIS — L0233 Carbuncle of buttock: Secondary | ICD-10-CM

## 2014-07-02 DIAGNOSIS — L739 Follicular disorder, unspecified: Secondary | ICD-10-CM

## 2014-07-02 MED ORDER — DOXYCYCLINE HYCLATE 100 MG PO TABS: 100.0000 mg | ORAL_TABLET | Freq: Two times a day (BID) | ORAL | Status: DC

## 2014-07-02 NOTE — Telephone Encounter (Signed)
Faxed yesterday at appointment to wrong pharmacy.

## 2014-07-05 ENCOUNTER — Ambulatory Visit: Payer: Medicare Other | Admitting: Nurse Practitioner

## 2014-08-21 ENCOUNTER — Other Ambulatory Visit: Payer: Self-pay | Admitting: Internal Medicine

## 2014-08-24 ENCOUNTER — Other Ambulatory Visit: Payer: Federal, State, Local not specified - PPO

## 2014-08-24 DIAGNOSIS — Z125 Encounter for screening for malignant neoplasm of prostate: Secondary | ICD-10-CM | POA: Diagnosis not present

## 2014-08-24 DIAGNOSIS — E785 Hyperlipidemia, unspecified: Secondary | ICD-10-CM

## 2014-08-24 DIAGNOSIS — E1165 Type 2 diabetes mellitus with hyperglycemia: Secondary | ICD-10-CM

## 2014-08-24 DIAGNOSIS — N183 Chronic kidney disease, stage 3 unspecified: Secondary | ICD-10-CM

## 2014-08-24 DIAGNOSIS — E1129 Type 2 diabetes mellitus with other diabetic kidney complication: Secondary | ICD-10-CM

## 2014-08-24 DIAGNOSIS — IMO0002 Reserved for concepts with insufficient information to code with codable children: Secondary | ICD-10-CM

## 2014-08-25 LAB — BASIC METABOLIC PANEL
BUN/Creatinine Ratio: 18 (ref 10–22)
BUN: 27 mg/dL (ref 8–27)
CO2: 23 mmol/L (ref 18–29)
Calcium: 9.6 mg/dL (ref 8.6–10.2)
Chloride: 101 mmol/L (ref 97–108)
Creatinine, Ser: 1.48 mg/dL — ABNORMAL HIGH (ref 0.76–1.27)
GFR calc Af Amer: 56 mL/min/{1.73_m2} — ABNORMAL LOW (ref >59)
GFR calc non Af Amer: 49 mL/min/{1.73_m2} — ABNORMAL LOW (ref >59)
Glucose: 201 mg/dL — ABNORMAL HIGH (ref 65–99)
POTASSIUM: 5.3 mmol/L — AB (ref 3.5–5.2)
Sodium: 139 mmol/L (ref 134–144)

## 2014-08-25 LAB — HEMOGLOBIN A1C
ESTIMATED AVERAGE GLUCOSE: 252 mg/dL
HEMOGLOBIN A1C: 10.4 % — AB (ref 4.8–5.6)

## 2014-08-25 LAB — LIPID PANEL
CHOLESTEROL TOTAL: 246 mg/dL — AB (ref 100–199)
Chol/HDL Ratio: 5.2 ratio units — ABNORMAL HIGH (ref 0.0–5.0)
HDL: 47 mg/dL (ref >39)
LDL Calculated: 169 mg/dL — ABNORMAL HIGH (ref 0–99)
Triglycerides: 152 mg/dL — ABNORMAL HIGH (ref 0–149)
VLDL Cholesterol Cal: 30 mg/dL (ref 5–40)

## 2014-08-25 LAB — PSA: Prostate Specific Ag, Serum: 0.9 ng/mL (ref 0.0–4.0)

## 2014-08-25 LAB — ALT: ALT: 45 IU/L — ABNORMAL HIGH (ref 0–44)

## 2014-08-27 ENCOUNTER — Encounter: Payer: Self-pay | Admitting: Internal Medicine

## 2014-08-27 ENCOUNTER — Ambulatory Visit (INDEPENDENT_AMBULATORY_CARE_PROVIDER_SITE_OTHER): Payer: Medicare Other | Admitting: Internal Medicine

## 2014-08-27 VITALS — BP 126/58 | HR 87 | Temp 97.7°F | Resp 20 | Ht 72.0 in | Wt 255.8 lb

## 2014-08-27 DIAGNOSIS — E1129 Type 2 diabetes mellitus with other diabetic kidney complication: Secondary | ICD-10-CM

## 2014-08-27 DIAGNOSIS — N183 Chronic kidney disease, stage 3 unspecified: Secondary | ICD-10-CM

## 2014-08-27 DIAGNOSIS — I10 Essential (primary) hypertension: Secondary | ICD-10-CM | POA: Diagnosis not present

## 2014-08-27 DIAGNOSIS — J4520 Mild intermittent asthma, uncomplicated: Secondary | ICD-10-CM | POA: Diagnosis not present

## 2014-08-27 DIAGNOSIS — Z716 Tobacco abuse counseling: Secondary | ICD-10-CM

## 2014-08-27 DIAGNOSIS — E1165 Type 2 diabetes mellitus with hyperglycemia: Secondary | ICD-10-CM

## 2014-08-27 DIAGNOSIS — R197 Diarrhea, unspecified: Secondary | ICD-10-CM | POA: Diagnosis not present

## 2014-08-27 DIAGNOSIS — IMO0002 Reserved for concepts with insufficient information to code with codable children: Secondary | ICD-10-CM

## 2014-08-27 MED ORDER — CIPROFLOXACIN HCL 500 MG PO TABS: 500.0000 mg | ORAL_TABLET | Freq: Two times a day (BID) | ORAL | Status: DC

## 2014-08-27 MED ORDER — ATORVASTATIN CALCIUM 20 MG PO TABS: ORAL_TABLET | ORAL | Status: DC

## 2014-08-27 NOTE — Patient Instructions (Signed)
Increase levemir insulin to 66 units twice daily. Watch complex carbs  Continue other medications as ordered  Follow up in 3 mos for CPE  Diarrhea Diarrhea is frequent loose and watery bowel movements. It can cause you to feel weak and dehydrated. Dehydration can cause you to become tired and thirsty, have a dry mouth, and have decreased urination that often is dark yellow. Diarrhea is a sign of another problem, most often an infection that will not last long. In most cases, diarrhea typically lasts 2-3 days. However, it can last longer if it is a sign of something more serious. It is important to treat your diarrhea as directed by your caregiver to lessen or prevent future episodes of diarrhea. CAUSES  Some common causes include:  Gastrointestinal infections caused by viruses, bacteria, or parasites.  Food poisoning or food allergies.  Certain medicines, such as antibiotics, chemotherapy, and laxatives.  Artificial sweeteners and fructose.  Digestive disorders. HOME CARE INSTRUCTIONS  Ensure adequate fluid intake (hydration): Have 1 cup (8 oz) of fluid for each diarrhea episode. Avoid fluids that contain simple sugars or sports drinks, fruit juices, whole milk products, and sodas. Your urine should be clear or pale yellow if you are drinking enough fluids. Hydrate with an oral rehydration solution that you can purchase at pharmacies, retail stores, and online. You can prepare an oral rehydration solution at home by mixing the following ingredients together:   - tsp table salt.   tsp baking soda.   tsp salt substitute containing potassium chloride.  1  tablespoons sugar.  1 L (34 oz) of water.  Certain foods and beverages may increase the speed at which food moves through the gastrointestinal (GI) tract. These foods and beverages should be avoided and include:  Caffeinated and alcoholic beverages.  High-fiber foods, such as raw fruits and vegetables, nuts, seeds, and whole  grain breads and cereals.  Foods and beverages sweetened with sugar alcohols, such as xylitol, sorbitol, and mannitol.  Some foods may be well tolerated and may help thicken stool including:  Starchy foods, such as rice, toast, pasta, low-sugar cereal, oatmeal, grits, baked potatoes, crackers, and bagels.  Bananas.  Applesauce.  Add probiotic-rich foods to help increase healthy bacteria in the GI tract, such as yogurt and fermented milk products.  Wash your hands well after each diarrhea episode.  Only take over-the-counter or prescription medicines as directed by your caregiver.  Take a warm bath to relieve any burning or pain from frequent diarrhea episodes. SEEK IMMEDIATE MEDICAL CARE IF:   You are unable to keep fluids down.  You have persistent vomiting.  You have blood in your stool, or your stools are black and tarry.  You do not urinate in 6-8 hours, or there is only a small amount of very dark urine.  You have abdominal pain that increases or localizes.  You have weakness, dizziness, confusion, or light-headedness.  You have a severe headache.  Your diarrhea gets worse or does not get better.  You have a fever or persistent symptoms for more than 2-3 days.  You have a fever and your symptoms suddenly get worse. MAKE SURE YOU:   Understand these instructions.  Will watch your condition.  Will get help right away if you are not doing well or get worse. Document Released: 01/19/2002 Document Revised: 06/15/2013 Document Reviewed: 10/07/2011 [facility] Patient Information 2015 [place], [place]. This information is not intended to replace advice given to you by your health care provider. Make sure you discuss  any questions you have with your health care provider.  Hyperglycemia Hyperglycemia occurs when the glucose (sugar) in your blood is too high. Hyperglycemia can happen for many reasons, but it most often happens to people who do not know they have diabetes or  are not managing their diabetes properly.  CAUSES  Whether you have diabetes or not, there are other causes of hyperglycemia. Hyperglycemia can occur when you have diabetes, but it can also occur in other situations that you might not be as aware of, such as: Diabetes  If you have diabetes and are having problems controlling your blood glucose, hyperglycemia could occur because of some of the following reasons:  Not following your meal plan.  Not taking your diabetes medications or not taking it properly.  Exercising less or doing less activity than you normally do.  Being sick. Pre-diabetes  This cannot be ignored. Before people develop Type 2 diabetes, they almost always have "pre-diabetes." This is when your blood glucose levels are higher than normal, but not yet high enough to be diagnosed as diabetes. Research has shown that some long-term damage to the body, especially the heart and circulatory system, may already be occurring during pre-diabetes. If you take action to manage your blood glucose when you have pre-diabetes, you may delay or prevent Type 2 diabetes from developing. Stress  If you have diabetes, you may be "diet" controlled or on oral medications or insulin to control your diabetes. However, you may find that your blood glucose is higher than usual in the hospital whether you have diabetes or not. This is often referred to as "stress hyperglycemia." Stress can elevate your blood glucose. This happens because of hormones put out by the body during times of stress. If stress has been the cause of your high blood glucose, it can be followed regularly by your caregiver. That way he/she can make sure your hyperglycemia does not continue to get worse or progress to diabetes. Steroids  Steroids are medications that act on the infection fighting system (immune system) to block inflammation or infection. One side effect can be a rise in blood glucose. Most people can produce enough  extra insulin to allow for this rise, but for those who cannot, steroids make blood glucose levels go even higher. It is not unusual for steroid treatments to "uncover" diabetes that is developing. It is not always possible to determine if the hyperglycemia will go away after the steroids are stopped. A special blood test called an A1c is sometimes done to determine if your blood glucose was elevated before the steroids were started. SYMPTOMS  Thirsty.  Frequent urination.  Dry mouth.  Blurred vision.  Tired or fatigue.  Weakness.  Sleepy.  Tingling in feet or leg. DIAGNOSIS  Diagnosis is made by monitoring blood glucose in one or all of the following ways:  A1c test. This is a chemical found in your blood.  Fingerstick blood glucose monitoring.  Laboratory results. TREATMENT  First, knowing the cause of the hyperglycemia is important before the hyperglycemia can be treated. Treatment may include, but is not be limited to:  Education.  Change or adjustment in medications.  Change or adjustment in meal plan.  Treatment for an illness, infection, etc.  More frequent blood glucose monitoring.  Change in exercise plan.  Decreasing or stopping steroids.  Lifestyle changes. HOME CARE INSTRUCTIONS   Test your blood glucose as directed.  Exercise regularly. Your caregiver will give you instructions about exercise. Pre-diabetes or diabetes which  comes on with stress is helped by exercising.  Eat wholesome, balanced meals. Eat often and at regular, fixed times. Your caregiver or nutritionist will give you a meal plan to guide your sugar intake.  Being at an ideal weight is important. If needed, losing as little as 10 to 15 pounds may help improve blood glucose levels. SEEK MEDICAL CARE IF:   You have questions about medicine, activity, or diet.  You continue to have symptoms (problems such as increased thirst, urination, or weight gain). SEEK IMMEDIATE MEDICAL CARE IF:    You are vomiting or have diarrhea.  Your breath smells fruity.  You are breathing faster or slower.  You are very sleepy or incoherent.  You have numbness, tingling, or pain in your feet or hands.  You have chest pain.  Your symptoms get worse even though you have been following your caregiver's orders.  If you have any other questions or concerns. Document Released: 07/25/2000 Document Revised: 04/23/2011 Document Reviewed: 05/28/2011 [facility] Patient Information 2015 [place], [place]. This information is not intended to replace advice given to you by your health care provider. Make sure you discuss any questions you have with your health care provider.

## 2014-08-27 NOTE — Progress Notes (Signed)
Patient ID: Henry Carroll, male   DOB: Jan 13, 1951, 64 y.o.   MRN: [id number]    Location:    [facility]   Place of Service:   OFFICE  Chief Complaint  Patient presents with  . Medical Management of Chronic Issues    3 month follow-up,labs printed,having trouble with diarrhea       HPI:  64 yo male seen today for f/u. He reports frequent loose stools x 3 days of sudden onset. No N/V. He noticed abdominal "growling" today but no pain. No bloody stools. His wife has similar sx's. He has been eating out a lot over the last week.he ate at [employer] just hrs before sx onset. He is now eating BRAT diet. No fever but has increased sweating (not new)  He has been out of lipitor for unknown duration  BS elevated at home. No low BS reactions. constant numbness in left 5th finger. He plays "a lot of golf". He has nocturia (every 2 hrs). No polyuria but has polydipsia  No asthma sx's. He is still smoking cigars while golfing.  BP stable on amlodipine, hydralazine, chlorthalidone, benazepril.  Past Medical History  Diagnosis Date  . Diabetes mellitus   . Allergy   . Asthma   . Hypertension   . Anemia   . Anxiety   . Hyperlipidemia   . Problems with sexual function     Past Surgical History  Procedure Laterality Date  . Tonsillectomy  1962  . Hernia repair  0000000    Umbilical  . Spine surgery  2006    L4 & L5  . Colon surgery  2011    Colonoscopy    Patient Care Team: Gildardo Cranker, DO as PCP - General (Internal Medicine)  History   Social History  . Marital Status: Married    Spouse Name: N/A  . Number of Children: N/A  . Years of Education: N/A   Occupational History  . Not on file.   Social History Main Topics  . Smoking status: Current Some Day Smoker    Types: Cigars  . Smokeless tobacco: Never Used  . Alcohol Use: 0.0 oz/week    0 Standard drinks or equivalent per week     Comment: socailly  . Drug Use: No  . Sexual Activity: Yes    Birth Control/ Protection: None     Other Topics Concern  . Not on file   Social History Narrative     reports that he has been smoking Cigars.  He has never used smokeless tobacco. He reports that he drinks alcohol. He reports that he does not use illicit drugs.  No Known Allergies  Medications: Patient's Medications  New Prescriptions   No medications on file  Previous Medications   ALBUTEROL (PROVENTIL) (2.5 MG/3ML) 0.083% NEBULIZER SOLUTION    Take 3 mLs (2.5 mg total) by nebulization every 6 (six) hours as needed for wheezing or shortness of breath.   AMBULATORY NON FORMULARY MEDICATION    One Touch Test Strips Test blood sugar three time daily for blood sugar Dx: 250.00   AMLODIPINE (NORVASC) 10 MG TABLET    Take 10 mg by mouth daily.   AMLODIPINE (NORVASC) 10 MG TABLET    TAKE ONE TABLET BY MOUTH ONCE DAILY   AMOXICILLIN (AMOXIL) 500 MG CAPSULE    Take 1 capsule (500 mg total) by mouth 3 (three) times daily. DX H65.191   ASPIRIN EC 81 MG TABLET    Take 81 mg by mouth daily.  ATORVASTATIN (LIPITOR) 20 MG TABLET    Take 1/2 tablet by mouth once daily for cholesterol   BD PEN NEEDLE NANO U/F 32G X 4 MM MISC       BENAZEPRIL (LOTENSIN) 40 MG TABLET    TAKE ONE TABLET BY MOUTH ONCE DAILY FOR BLOOD PRESSURE   CHLORTHALIDONE (HYGROTON) 25 MG TABLET    TAKE TWO TABLETS BY MOUTH ONCE DAILY   CHOLECALCIFEROL ([employer] VITAMIN D3) 1000 UNITS CAPSULE    Take 2 capsules (2,000 Units total) by mouth daily.   COENZYME Q10 (CO Q 10 PO)    Take 1 tablet by mouth daily.   DOXYCYCLINE (VIBRA-TABS) 100 MG TABLET    Take 1 tablet (100 mg total) by mouth 2 (two) times daily.   FLUTICASONE-SALMETEROL (ADVAIR DISKUS) 100-50 MCG/DOSE AEPB    INHALE ONE DOSE BY MOUTH TWICE DAILY   HYDRALAZINE (APRESOLINE) 25 MG TABLET    Take 1 tablet (25 mg total) by mouth 3 (three) times daily.   INSULIN ASPART (NOVOLOG) 100 UNIT/ML INJECTION    Inject 8 Units into the skin 3 (three) times daily with meals. Inject 8 u insulin only for blood sugar  greater than 180. Do not skip your meals   INSULIN DETEMIR (LEVEMIR) 100 UNIT/ML INJECTION    Inject 0.6 mLs (60 Units total) into the skin 2 (two) times daily.   LINAGLIPTIN (TRADJENTA) 5 MG TABS TABLET    Take 1 tablet (5 mg total) by mouth daily.   MONTELUKAST (SINGULAIR) 10 MG TABLET    TAKE ONE TABLET BY MOUTH AT BEDTIME   PROAIR HFA 108 (90 BASE) MCG/ACT INHALER    INHALE TWO PUFFS BY MOUTH EVERY 6 HOURS AS NEEDED FOR SHORTNESS OF BREATH   SYRINGE, DISPOSABLE, 3 ML MISC    Use syringe daily with Humalog, DX:250.00   TERBINAFINE (LAMISIL) 1 % CREAM    Apply 1 application topically daily as needed.  Modified Medications   No medications on file  Discontinued Medications   No medications on file    Review of Systems  Constitutional: Negative for chills, activity change and fatigue.  HENT: Negative for sore throat and trouble swallowing.   Eyes: Negative for visual disturbance.  Respiratory: Negative for cough, chest tightness and shortness of breath.   Cardiovascular: Negative for chest pain, palpitations and leg swelling.  Gastrointestinal: Negative for nausea, vomiting, abdominal pain and blood in stool.  Endocrine: Positive for polydipsia.  Genitourinary: Negative for urgency, frequency and difficulty urinating.  Musculoskeletal: Negative for arthralgias and gait problem.  Skin: Negative for rash.  Neurological: Positive for numbness. Negative for weakness and headaches.  Psychiatric/Behavioral: Negative for confusion and sleep disturbance. The patient is not nervous/anxious.     Filed Vitals:   08/27/14 1548  BP: 126/58  Pulse: 87  Temp: 97.7 F (36.5 C)  TempSrc: Oral  Resp: 20  Height: 6' (1.829 m)  Weight: 255 lb 12.8 oz (116.03 kg)  SpO2: 95%   Body mass index is 34.69 kg/(m^2).  Physical Exam  Constitutional: He is oriented to person, place, and time. He appears well-developed and well-nourished.  HENT:  Mouth/Throat: Oropharynx is clear and moist.  Eyes:  Pupils are equal, round, and reactive to light. No scleral icterus.  Neck: Neck supple. Carotid bruit is not present. No thyromegaly present.  Cardiovascular: Normal rate, regular rhythm, normal heart sounds and intact distal pulses.  Exam reveals no gallop and no friction rub.   No murmur heard. +1 pitting LE edema b/l.  No calf TTP  Pulmonary/Chest: Effort normal and breath sounds normal. He has no wheezes. He has no rales. He exhibits no tenderness.  Abdominal: He exhibits distension. He exhibits no abdominal bruit, no pulsatile midline mass and no mass. Bowel sounds are increased. There is no hepatomegaly. There is no tenderness. There is no rebound and no guarding.  Tinkling BS x 4 quadrants  Lymphadenopathy:    He has no cervical adenopathy.  Neurological: He is alert and oriented to person, place, and time.  Skin: Skin is warm and dry. No rash noted.  Psychiatric: He has a normal mood and affect. His behavior is normal. Thought content normal.   Diabetic Foot Exam - Simple   Simple Foot Form  Diabetic Foot exam was performed with the following findings:  Yes 08/27/2014  5:44 PM  Visual Inspection  See comments:  Yes  Sensation Testing  Intact to touch and monofilament testing bilaterally:  Yes  Pulse Check  Posterior Tibialis and Dorsalis pulse intact bilaterally:  Yes  Comments  B/l toenail dystrophic changes with thick nails. No calluses/ulcerations       Labs reviewed: Appointment on 08/24/2014  Component Date Value Ref Range Status  . Prostate Specific Ag, Serum 08/24/2014 0.9  0.0 - 4.0 ng/mL Final   Comment: Roche ECLIA methodology. According to the American Urological Association, Serum PSA should decrease and remain at undetectable levels after radical prostatectomy. The AUA defines biochemical recurrence as an initial PSA value 0.2 ng/mL or greater followed by a subsequent confirmatory PSA value 0.2 ng/mL or greater. Values obtained with different assay methods or  kits cannot be used interchangeably. Results cannot be interpreted as absolute evidence of the presence or absence of malignant disease.   . Glucose 08/24/2014 201* 65 - 99 mg/dL Final  . BUN 08/24/2014 27  8 - 27 mg/dL Final  . Creatinine, Ser 08/24/2014 1.48* 0.76 - 1.27 mg/dL Final  . GFR calc non Af Amer 08/24/2014 49* >59 mL/min/1.73 Final  . GFR calc Af Amer 08/24/2014 56* >59 mL/min/1.73 Final  . BUN/Creatinine Ratio 08/24/2014 18  10 - 22 Final  . Sodium 08/24/2014 139  134 - 144 mmol/L Final  . Potassium 08/24/2014 5.3* 3.5 - 5.2 mmol/L Final  . Chloride 08/24/2014 101  97 - 108 mmol/L Final  . CO2 08/24/2014 23  18 - 29 mmol/L Final  . Calcium 08/24/2014 9.6  8.6 - 10.2 mg/dL Final  . ALT 08/24/2014 45* 0 - 44 IU/L Final  . Hgb A1c MFr Bld 08/24/2014 10.4* 4.8 - 5.6 % Final   Comment:          Pre-diabetes: 5.7 - 6.4          Diabetes: >6.4          Glycemic control for adults with diabetes: <7.0   . Est. average glucose Bld gHb Est-m* 08/24/2014 252   Final  . Cholesterol, Total 08/24/2014 246* 100 - 199 mg/dL Final  . Triglycerides 08/24/2014 152* 0 - 149 mg/dL Final  . HDL 08/24/2014 47  >39 mg/dL Final   Comment: According to ATP-III Guidelines, HDL-C >59 mg/dL is considered a negative risk factor for CHD.   Marland Kitchen VLDL Cholesterol Cal 08/24/2014 30  5 - 40 mg/dL Final  . LDL Calculated 08/24/2014 169* 0 - 99 mg/dL Final  . Chol/HDL Ratio 08/24/2014 5.2* 0.0 - 5.0 ratio units Final   Comment:  T. Chol/HDL Ratio                                             Men  Women                               1/2 Avg.Risk  3.4    3.3                                   Avg.Risk  5.0    4.4                                2X Avg.Risk  9.6    7.1                                3X Avg.Risk 23.4   11.0     No results found.   Assessment/Plan   ICD-9-CM ICD-10-CM   1. Diarrhea due to gastroenteritis 787.91 R19.7 ciprofloxacin (CIPRO) 500 MG tablet  2.  DM type 2, uncontrolled, with renal complications - BS hyperglycemic 250.42 E11.29     E11.65   3. Essential hypertension - stable 401.9 I10   4. CKD (chronic kidney disease) stage 3, GFR 30-59 ml/min - stable 585.3 N18.3   5. Asthma, chronic, mild intermittent, uncomplicated - controlled 493.90 J45.20   6. Tobacco abuse counseling V65.42 Z71.6    305.1     --spent >5 minutes counseling pt about smoking cessation options. He is not ready to quit. He is aware of potential complications of smoking  --BRAT diet and advance as tolerated. Push fluids and rest  --Increase levemir insulin to 66 units twice daily. Watch complex carbs  --Continue other medications as ordered  --education handouts given for diarrhea and hyperglycemia  --Follow up in 3 mos for CPE  [facility] S. Perlie Gold  [facility] [address] [place], [place] [address] [phone] Cell (Monday-Friday 8 AM - 5 PM) [phone] After 5 PM and follow prompts

## 2014-08-29 LAB — MICROALBUMIN / CREATININE URINE RATIO
Creatinine, Urine: 158.2 mg/dL
MICROALB/CREAT RATIO: 2029.1 mg/g creat — ABNORMAL HIGH (ref 0.0–30.0)
MICROALBUM., U, RANDOM: 3210.1 ug/mL

## 2014-09-02 ENCOUNTER — Other Ambulatory Visit: Payer: Self-pay

## 2014-09-02 DIAGNOSIS — E1121 Type 2 diabetes mellitus with diabetic nephropathy: Secondary | ICD-10-CM

## 2014-09-20 ENCOUNTER — Other Ambulatory Visit: Payer: Self-pay | Admitting: Internal Medicine

## 2014-10-22 ENCOUNTER — Other Ambulatory Visit: Payer: Self-pay | Admitting: Internal Medicine

## 2014-10-25 ENCOUNTER — Other Ambulatory Visit: Payer: Self-pay | Admitting: *Deleted

## 2014-10-25 MED ORDER — INSULIN ASPART 100 UNIT/ML ~~LOC~~ SOLN: 8.0000 [IU] | Freq: Three times a day (TID) | SUBCUTANEOUS | Status: DC

## 2014-10-25 NOTE — Telephone Encounter (Signed)
[facility] [place].  

## 2014-11-24 ENCOUNTER — Other Ambulatory Visit: Payer: Self-pay | Admitting: Internal Medicine

## 2014-11-30 ENCOUNTER — Other Ambulatory Visit: Payer: Federal, State, Local not specified - PPO

## 2014-11-30 DIAGNOSIS — I10 Essential (primary) hypertension: Secondary | ICD-10-CM

## 2014-11-30 DIAGNOSIS — N183 Chronic kidney disease, stage 3 unspecified: Secondary | ICD-10-CM

## 2014-11-30 DIAGNOSIS — E1129 Type 2 diabetes mellitus with other diabetic kidney complication: Secondary | ICD-10-CM | POA: Diagnosis not present

## 2014-12-01 ENCOUNTER — Ambulatory Visit (INDEPENDENT_AMBULATORY_CARE_PROVIDER_SITE_OTHER): Payer: Medicare Other | Admitting: Internal Medicine

## 2014-12-01 ENCOUNTER — Encounter: Payer: Self-pay | Admitting: Internal Medicine

## 2014-12-01 VITALS — BP 122/70 | HR 86 | Temp 97.8°F | Resp 20 | Ht 72.0 in | Wt 254.0 lb

## 2014-12-01 DIAGNOSIS — I10 Essential (primary) hypertension: Secondary | ICD-10-CM

## 2014-12-01 DIAGNOSIS — J4520 Mild intermittent asthma, uncomplicated: Secondary | ICD-10-CM

## 2014-12-01 DIAGNOSIS — E1165 Type 2 diabetes mellitus with hyperglycemia: Secondary | ICD-10-CM

## 2014-12-01 DIAGNOSIS — Z23 Encounter for immunization: Secondary | ICD-10-CM

## 2014-12-01 DIAGNOSIS — E1121 Type 2 diabetes mellitus with diabetic nephropathy: Secondary | ICD-10-CM | POA: Diagnosis not present

## 2014-12-01 DIAGNOSIS — IMO0002 Reserved for concepts with insufficient information to code with codable children: Secondary | ICD-10-CM

## 2014-12-01 DIAGNOSIS — E785 Hyperlipidemia, unspecified: Secondary | ICD-10-CM | POA: Diagnosis not present

## 2014-12-01 DIAGNOSIS — Z0000 Encounter for general adult medical examination without abnormal findings: Secondary | ICD-10-CM | POA: Diagnosis not present

## 2014-12-01 LAB — LIPID PANEL
CHOL/HDL RATIO: 3.7 ratio (ref 0.0–5.0)
Cholesterol, Total: 153 mg/dL (ref 100–199)
HDL: 41 mg/dL (ref >39)
LDL Calculated: 83 mg/dL (ref 0–99)
Triglycerides: 145 mg/dL (ref 0–149)
VLDL CHOLESTEROL CAL: 29 mg/dL (ref 5–40)

## 2014-12-01 LAB — CBC WITH DIFFERENTIAL/PLATELET
BASOS ABS: 0 10*3/uL (ref 0.0–0.2)
Basos: 0 %
EOS (ABSOLUTE): 0.1 10*3/uL (ref 0.0–0.4)
Eos: 3 %
HEMOGLOBIN: 10.3 g/dL — AB (ref 12.6–17.7)
Hematocrit: 30.7 % — ABNORMAL LOW (ref 37.5–51.0)
Immature Grans (Abs): 0 10*3/uL (ref 0.0–0.1)
Immature Granulocytes: 0 %
LYMPHS ABS: 1.6 10*3/uL (ref 0.7–3.1)
Lymphs: 39 %
MCH: 30.8 pg (ref 26.6–33.0)
MCHC: 33.6 g/dL (ref 31.5–35.7)
MCV: 92 fL (ref 79–97)
MONOS ABS: 0.3 10*3/uL (ref 0.1–0.9)
Monocytes: 7 %
NEUTROS ABS: 2.1 10*3/uL (ref 1.4–7.0)
Neutrophils: 51 %
PLATELETS: 280 10*3/uL (ref 150–379)
RBC: 3.34 x10E6/uL — ABNORMAL LOW (ref 4.14–5.80)
RDW: 13.8 % (ref 12.3–15.4)
WBC: 4.2 10*3/uL (ref 3.4–10.8)

## 2014-12-01 LAB — COMPREHENSIVE METABOLIC PANEL
ALK PHOS: 75 IU/L (ref 39–117)
ALT: 32 IU/L (ref 0–44)
AST: 29 IU/L (ref 0–40)
Albumin/Globulin Ratio: 1.7 (ref 1.1–2.5)
Albumin: 3.6 g/dL (ref 3.6–4.8)
BUN/Creatinine Ratio: 24 — ABNORMAL HIGH (ref 10–22)
BUN: 43 mg/dL — ABNORMAL HIGH (ref 8–27)
CHLORIDE: 101 mmol/L (ref 97–106)
CO2: 23 mmol/L (ref 18–29)
Calcium: 9 mg/dL (ref 8.6–10.2)
Creatinine, Ser: 1.79 mg/dL — ABNORMAL HIGH (ref 0.76–1.27)
GFR calc Af Amer: 45 mL/min/{1.73_m2} — ABNORMAL LOW (ref >59)
GFR calc non Af Amer: 39 mL/min/{1.73_m2} — ABNORMAL LOW (ref >59)
GLUCOSE: 194 mg/dL — AB (ref 65–99)
Globulin, Total: 2.1 g/dL (ref 1.5–4.5)
POTASSIUM: 5.1 mmol/L (ref 3.5–5.2)
SODIUM: 136 mmol/L (ref 136–144)
Total Protein: 5.7 g/dL — ABNORMAL LOW (ref 6.0–8.5)

## 2014-12-01 LAB — HEMOGLOBIN A1C
ESTIMATED AVERAGE GLUCOSE: 229 mg/dL
HEMOGLOBIN A1C: 9.6 % — AB (ref 4.8–5.6)

## 2014-12-01 MED ORDER — INSULIN ASPART 100 UNIT/ML ~~LOC~~ SOLN: 8.0000 [IU] | Freq: Three times a day (TID) | SUBCUTANEOUS | Status: DC

## 2014-12-01 MED ORDER — AMLODIPINE BESYLATE 10 MG PO TABS: 10.0000 mg | ORAL_TABLET | Freq: Every day | ORAL | Status: DC

## 2014-12-01 NOTE — Patient Instructions (Addendum)
Increase levemir 70 units twice daily  Continue other medications as ordered  Flu vaccine given today  Encouraged him to exercise 30-45 minutes 4-5 times per week. Eat a well balanced diet. Avoid smoking. Limit alcohol intake. Wear seatbelt when riding in the car. Wear sun block (SPF >50) when spending extended times outside.  Follow up in 3 mos for routine visit. Fasting labs 2-3 days prior

## 2014-12-01 NOTE — Progress Notes (Signed)
Patient ID: Henry Carroll, male   DOB: August 03, 1950, 64 y.o.   MRN: [id number] Subjective:     Henry Carroll is a 64 y.o. male and is here for a comprehensive physical exam. The patient reports no problems. He goes to synchronized swimming on a regular basis. No falls in last year. No depression sx's. He still smokes 3-4 cigars per month  DM - BS at home 110s - 160s, rarely >200. No low BS reactions. Occasionally uses Novolog but no more than 4 times per month. No numbness or tingling. Takes tradjenta and levemir  HTN - stable. No muscle cramps since last OV. Takes lotensin, chlorthalidone, hydralazine, amlodipine  Hyperlipidemia - stable on statin. No myalgias  Asthma - no attacks in the last yr. He takes advair and albuterol neb  Needs med RF  Past Medical History  Diagnosis Date  . Diabetes mellitus   . Allergy   . Asthma   . Hypertension   . Anemia   . Anxiety   . Hyperlipidemia   . Problems with sexual function    Past Surgical History  Procedure Laterality Date  . Tonsillectomy  1962  . Hernia repair  0000000    Umbilical  . Spine surgery  2006    L4 & L5  . Colon surgery  2011    Colonoscopy   . Family History  Problem Relation Age of Onset  . Diabetes Mother   . Heart disease Father   . Stroke Sister      Social History   Social History  . Marital Status: Married    Spouse Name: N/A  . Number of Children: N/A  . Years of Education: N/A   Occupational History  . Not on file.   Social History Main Topics  . Smoking status: Current Some Day Smoker    Types: Cigars  . Smokeless tobacco: Never Used  . Alcohol Use: 0.0 oz/week    0 Standard drinks or equivalent per week     Comment: socailly  . Drug Use: No  . Sexual Activity: Yes    Birth Control/ Protection: None   Other Topics Concern  . Not on file   Social History Narrative   Health Maintenance  Topic Date Due  . Hepatitis C Screening  May 28, 1950  . ZOSTAVAX  02/22/2008  .  INFLUENZA VACCINE  09/13/2014  . OPHTHALMOLOGY EXAM  11/18/2014  . HEMOGLOBIN A1C  02/24/2015  . URINE MICROALBUMIN  08/27/2015  . COLONOSCOPY  04/23/2019  . TETANUS/TDAP  03/25/2021  . PNA vac Low Risk Adult  Completed    Review of Systems   Review of Systems  Constitutional: Negative for fever, chills and malaise/fatigue.  HENT: Negative for sore throat and tinnitus.   Eyes: Negative for blurred vision and double vision.  Respiratory: Negative for cough, shortness of breath and wheezing.   Cardiovascular: Negative for chest pain, palpitations, orthopnea and leg swelling.  Gastrointestinal: Negative for heartburn, nausea, vomiting, abdominal pain, diarrhea, constipation and blood in stool.  Genitourinary: Negative for dysuria, urgency, frequency and hematuria.  Musculoskeletal: Positive for joint pain. Negative for myalgias and falls.  Skin: Negative for rash.  Neurological: Negative for dizziness, tingling, tremors, sensory change, focal weakness, seizures, loss of consciousness, weakness and headaches.  Endo/Heme/Allergies: Negative for environmental allergies. Does not bruise/bleed easily.  Psychiatric/Behavioral: Negative for depression and memory loss. The patient is not nervous/anxious and does not have insomnia.      Objective:      Physical  Exam  Constitutional: He is oriented to person, place, and time and well-developed, well-nourished, and in no distress.  HENT:  Head: Normocephalic and atraumatic.  Right Ear: Hearing, tympanic membrane, external ear and ear canal normal.  Left Ear: Hearing, tympanic membrane, external ear and ear canal normal.  Mouth/Throat: Uvula is midline, oropharynx is clear and moist and mucous membranes are normal.  Eyes: Conjunctivae, EOM and lids are normal. Right eye exhibits no discharge. No scleral icterus.  Neck: Trachea normal. Neck supple. Carotid bruit is not present. No tracheal deviation present. No thyroid mass and no thyromegaly  present.  Cardiovascular: Normal rate, regular rhythm, normal heart sounds and intact distal pulses.  Exam reveals no gallop and no friction rub.   No murmur heard. Pulmonary/Chest: Effort normal and breath sounds normal. No stridor. No respiratory distress. He has no wheezes. He has no rhonchi. He has no rales. He exhibits no mass, no tenderness and no crepitus. Right breast exhibits no inverted nipple, no mass, no nipple discharge, no skin change and no tenderness. Left breast exhibits no inverted nipple, no mass, no nipple discharge, no skin change and no tenderness. Breasts are symmetrical.  Abdominal: Soft. Normal appearance, normal aorta and bowel sounds are normal. He exhibits no abdominal bruit, no ascites, no pulsatile midline mass and no mass. There is no hepatosplenomegaly. There is no tenderness. There is no rebound. No hernia.  Genitourinary: Rectum normal, testes/scrotum normal and penis normal. Rectal exam shows no mass. Prostate is enlarged (smooth, firm, no palpable mass). Prostate is not tender. No discharge found.  Musculoskeletal: He exhibits edema.  Lymphadenopathy:       Head (right side): No submandibular and no posterior auricular adenopathy present.       Head (left side): No submandibular and no posterior auricular adenopathy present.    He has no cervical adenopathy.       Right: No supraclavicular adenopathy present.       Left: No supraclavicular adenopathy present.  Neurological: He is alert and oriented to person, place, and time. He has normal motor skills, normal strength and normal reflexes. Gait normal.  Skin: Skin is warm, dry and intact. No rash noted.  Psychiatric: Mood, memory, affect and judgment normal.   Diabetic Foot Exam - Simple   Simple Foot Form  Diabetic Foot exam was performed with the following findings:  Yes 12/01/2014  4:30 PM  Visual Inspection  See comments:  Yes  Sensation Testing  Intact to touch and monofilament testing bilaterally:  Yes   Pulse Check  Posterior Tibialis and Dorsalis pulse intact bilaterally:  Yes  Comments  Trace LE edema b/l. Tinea pedis noted. No bunions or calluses. No toenail deformity       MMSE - Mini Mental State Exam 12/01/2014 05/05/2013  Orientation to time 5 4  Orientation to Place 5 5  Registration 3 3  Attention/ Calculation 5 5  Recall 1 2  Language- name 2 objects 2 2  Language- repeat 1 1  Language- follow 3 step command 3 3  Language- read & follow direction 1 1  Write a sentence 1 1  Copy design 1 1  Total score 28 28   Recent Results (from the past 2160 hour(s))  Lipid Panel     Status: None   Collection Time: 11/30/14 10:22 AM  Result Value Ref Range   Cholesterol, Total 153 100 - 199 mg/dL   Triglycerides 145 0 - 149 mg/dL   HDL 41 >39 mg/dL  Comment: According to ATP-III Guidelines, HDL-C >59 mg/dL is considered a negative risk factor for CHD.    VLDL Cholesterol Cal 29 5 - 40 mg/dL   LDL Calculated 83 0 - 99 mg/dL   Chol/HDL Ratio 3.7 0.0 - 5.0 ratio units    Comment:                                   T. Chol/HDL Ratio                                             Men  Women                               1/2 Avg.Risk  3.4    3.3                                   Avg.Risk  5.0    4.4                                2X Avg.Risk  9.6    7.1                                3X Avg.Risk 23.4   11.0   CMP     Status: Abnormal   Collection Time: 11/30/14 10:22 AM  Result Value Ref Range   Glucose 194 (H) 65 - 99 mg/dL   BUN 43 (H) 8 - 27 mg/dL   Creatinine, Ser 1.79 (H) 0.76 - 1.27 mg/dL   GFR calc non Af Amer 39 (L) >59 mL/min/1.73   GFR calc Af Amer 45 (L) >59 mL/min/1.73   BUN/Creatinine Ratio 24 (H) 10 - 22   Sodium 136 136 - 144 mmol/L    Comment:               **Please note reference interval change**   Potassium 5.1 3.5 - 5.2 mmol/L    Comment:               **Please note reference interval change**   Chloride 101 97 - 106 mmol/L    Comment:                **Please note reference interval change**   CO2 23 18 - 29 mmol/L   Calcium 9.0 8.6 - 10.2 mg/dL   Total Protein 5.7 (L) 6.0 - 8.5 g/dL   Albumin 3.6 3.6 - 4.8 g/dL   Globulin, Total 2.1 1.5 - 4.5 g/dL   Albumin/Globulin Ratio 1.7 1.1 - 2.5   Bilirubin Total <0.2 0.0 - 1.2 mg/dL   Alkaline Phosphatase 75 39 - 117 IU/L   AST 29 0 - 40 IU/L   ALT 32 0 - 44 IU/L  Hemoglobin A1c     Status: Abnormal   Collection Time: 11/30/14 10:22 AM  Result Value Ref Range   Hgb A1c MFr Bld 9.6 (H) 4.8 - 5.6 %    Comment:          Pre-diabetes: 5.7 - 6.4  Diabetes: >6.4          Glycemic control for adults with diabetes: <7.0    Est. average glucose Bld gHb Est-mCnc 229 mg/dL  CBC with Differential     Status: Abnormal   Collection Time: 11/30/14 10:22 AM  Result Value Ref Range   WBC 4.2 3.4 - 10.8 x10E3/uL   RBC 3.34 (L) 4.14 - 5.80 x10E6/uL   Hemoglobin 10.3 (L) 12.6 - 17.7 g/dL   Hematocrit 30.7 (L) 37.5 - 51.0 %   MCV 92 79 - 97 fL   MCH 30.8 26.6 - 33.0 pg   MCHC 33.6 31.5 - 35.7 g/dL   RDW 13.8 12.3 - 15.4 %   Platelets 280 150 - 379 x10E3/uL   Neutrophils 51 %   Lymphs 39 %   Monocytes 7 %   Eos 3 %   Basos 0 %   Neutrophils Absolute 2.1 1.4 - 7.0 x10E3/uL   Lymphocytes Absolute 1.6 0.7 - 3.1 x10E3/uL   Monocytes Absolute 0.3 0.1 - 0.9 x10E3/uL   EOS (ABSOLUTE) 0.1 0.0 - 0.4 x10E3/uL   Basophils Absolute 0.0 0.0 - 0.2 x10E3/uL   Immature Granulocytes 0 %   Immature Grans (Abs) 0.0 0.0 - 0.1 x10E3/uL    Assessment:    Healthy male exam.       ICD-9-CM ICD-10-CM   1. Well adult exam V70.0 Z00.00   2. Uncontrolled type 2 diabetes mellitus with diabetic nephropathy, without long-term current use of insulin (HCC) 250.42 E11.21 insulin aspart (NOVOLOG) 100 UNIT/ML injection   583.81 E11.65   3. Asthma, chronic, mild intermittent, uncomplicated 123456 [id number]   4. Essential hypertension 401.9 I10 amLODipine (NORVASC) 10 MG tablet  5. Hyperlipidemia with target LDL less  than 100 272.4 E78.5   6. Encounter for immunization Z23 Z23   7.       Tinea pedis   Plan:     See After Visit Summary for Counseling Recommendations   Pt is UTD on health maintenance. Vaccinations are UTD. Pt maintains a healthy lifestyle. Encouraged pt to exercise 30-45 minutes 4-5 times per week. Eat a well balanced diet. Avoid smoking. Limit alcohol intake. Wear seatbelt when riding in the car. Wear sun block (SPF >50) when spending extended times outside. Increase levemir 70 units twice daily  Continue other medications as ordered  Flu vaccine given today  Keep feet dry. May need topical cream for tinea  Smoking cessation discussed and highly urged  Follow up in 3 mos for routine visit. Fasting labs 2-3 days prior  Mayagi¼ez S. Perlie Gold  [facility] [address] [place], [place] [address] [phone] Cell (Monday-Friday 8 AM - 5 PM) [phone] After 5 PM and follow prompts

## 2014-12-01 NOTE — Progress Notes (Signed)
Passed clock test 

## 2014-12-27 ENCOUNTER — Other Ambulatory Visit: Payer: Self-pay | Admitting: Internal Medicine

## 2014-12-29 ENCOUNTER — Ambulatory Visit (INDEPENDENT_AMBULATORY_CARE_PROVIDER_SITE_OTHER): Payer: Medicare Other

## 2014-12-29 ENCOUNTER — Inpatient Hospital Stay (HOSPITAL_COMMUNITY)
Admission: EM | Admit: 2014-12-29 | Discharge: 2014-12-31 | DRG: 291 | Disposition: A | Discharge status: Home / Self Care | Payer: Medicare Other | Attending: Cardiology | Admitting: Cardiology

## 2014-12-29 ENCOUNTER — Encounter (HOSPITAL_COMMUNITY): Payer: Self-pay | Admitting: General Practice

## 2014-12-29 ENCOUNTER — Ambulatory Visit (INDEPENDENT_AMBULATORY_CARE_PROVIDER_SITE_OTHER): Payer: Medicare Other | Admitting: Physician Assistant

## 2014-12-29 VITALS — BP 162/98 | HR 97 | Temp 98.5°F | Resp 17 | Ht 73.0 in | Wt 265.0 lb

## 2014-12-29 DIAGNOSIS — I248 Other forms of acute ischemic heart disease: Secondary | ICD-10-CM

## 2014-12-29 DIAGNOSIS — I509 Heart failure, unspecified: Secondary | ICD-10-CM | POA: Diagnosis not present

## 2014-12-29 DIAGNOSIS — I5033 Acute on chronic diastolic (congestive) heart failure: Secondary | ICD-10-CM | POA: Diagnosis not present

## 2014-12-29 DIAGNOSIS — J45909 Unspecified asthma, uncomplicated: Secondary | ICD-10-CM | POA: Diagnosis present

## 2014-12-29 DIAGNOSIS — I5043 Acute on chronic combined systolic (congestive) and diastolic (congestive) heart failure: Secondary | ICD-10-CM

## 2014-12-29 DIAGNOSIS — I130 Hypertensive heart and chronic kidney disease with heart failure and stage 1 through stage 4 chronic kidney disease, or unspecified chronic kidney disease: Principal | ICD-10-CM | POA: Diagnosis present

## 2014-12-29 DIAGNOSIS — R030 Elevated blood-pressure reading, without diagnosis of hypertension: Secondary | ICD-10-CM

## 2014-12-29 DIAGNOSIS — Z79899 Other long term (current) drug therapy: Secondary | ICD-10-CM

## 2014-12-29 DIAGNOSIS — R778 Other specified abnormalities of plasma proteins: Secondary | ICD-10-CM | POA: Insufficient documentation

## 2014-12-29 DIAGNOSIS — R140 Abdominal distension (gaseous): Secondary | ICD-10-CM

## 2014-12-29 DIAGNOSIS — R9431 Abnormal electrocardiogram [ECG] [EKG]: Secondary | ICD-10-CM | POA: Diagnosis not present

## 2014-12-29 DIAGNOSIS — J449 Chronic obstructive pulmonary disease, unspecified: Secondary | ICD-10-CM | POA: Diagnosis present

## 2014-12-29 DIAGNOSIS — N183 Chronic kidney disease, stage 3 unspecified: Secondary | ICD-10-CM

## 2014-12-29 DIAGNOSIS — Z7982 Long term (current) use of aspirin: Secondary | ICD-10-CM | POA: Diagnosis not present

## 2014-12-29 DIAGNOSIS — R609 Edema, unspecified: Secondary | ICD-10-CM | POA: Diagnosis not present

## 2014-12-29 DIAGNOSIS — R0602 Shortness of breath: Secondary | ICD-10-CM | POA: Diagnosis not present

## 2014-12-29 DIAGNOSIS — Z6833 Body mass index (BMI) 33.0-33.9, adult: Secondary | ICD-10-CM

## 2014-12-29 DIAGNOSIS — F419 Anxiety disorder, unspecified: Secondary | ICD-10-CM | POA: Diagnosis present

## 2014-12-29 DIAGNOSIS — R7989 Other specified abnormal findings of blood chemistry: Secondary | ICD-10-CM

## 2014-12-29 DIAGNOSIS — E1165 Type 2 diabetes mellitus with hyperglycemia: Secondary | ICD-10-CM | POA: Diagnosis not present

## 2014-12-29 DIAGNOSIS — I214 Non-ST elevation (NSTEMI) myocardial infarction: Secondary | ICD-10-CM

## 2014-12-29 DIAGNOSIS — Z794 Long term (current) use of insulin: Secondary | ICD-10-CM

## 2014-12-29 DIAGNOSIS — IMO0002 Reserved for concepts with insufficient information to code with codable children: Secondary | ICD-10-CM | POA: Diagnosis present

## 2014-12-29 DIAGNOSIS — E1122 Type 2 diabetes mellitus with diabetic chronic kidney disease: Secondary | ICD-10-CM

## 2014-12-29 DIAGNOSIS — R0600 Dyspnea, unspecified: Secondary | ICD-10-CM | POA: Diagnosis not present

## 2014-12-29 DIAGNOSIS — R062 Wheezing: Secondary | ICD-10-CM | POA: Diagnosis not present

## 2014-12-29 DIAGNOSIS — R600 Localized edema: Secondary | ICD-10-CM | POA: Diagnosis not present

## 2014-12-29 DIAGNOSIS — E11649 Type 2 diabetes mellitus with hypoglycemia without coma: Secondary | ICD-10-CM | POA: Diagnosis present

## 2014-12-29 DIAGNOSIS — E669 Obesity, unspecified: Secondary | ICD-10-CM | POA: Diagnosis present

## 2014-12-29 DIAGNOSIS — I10 Essential (primary) hypertension: Secondary | ICD-10-CM

## 2014-12-29 DIAGNOSIS — F17290 Nicotine dependence, other tobacco product, uncomplicated: Secondary | ICD-10-CM | POA: Diagnosis present

## 2014-12-29 DIAGNOSIS — D631 Anemia in chronic kidney disease: Secondary | ICD-10-CM | POA: Diagnosis present

## 2014-12-29 DIAGNOSIS — E785 Hyperlipidemia, unspecified: Secondary | ICD-10-CM

## 2014-12-29 DIAGNOSIS — E1129 Type 2 diabetes mellitus with other diabetic kidney complication: Secondary | ICD-10-CM | POA: Diagnosis present

## 2014-12-29 DIAGNOSIS — D638 Anemia in other chronic diseases classified elsewhere: Secondary | ICD-10-CM | POA: Diagnosis present

## 2014-12-29 DIAGNOSIS — N189 Chronic kidney disease, unspecified: Secondary | ICD-10-CM

## 2014-12-29 DIAGNOSIS — I110 Hypertensive heart disease with heart failure: Secondary | ICD-10-CM | POA: Diagnosis not present

## 2014-12-29 DIAGNOSIS — IMO0001 Reserved for inherently not codable concepts without codable children: Secondary | ICD-10-CM

## 2014-12-29 HISTORY — DX: Chronic combined systolic (congestive) and diastolic (congestive) heart failure: I50.42

## 2014-12-29 HISTORY — DX: Morbid (severe) obesity due to excess calories: E66.01

## 2014-12-29 LAB — CBC WITH DIFFERENTIAL/PLATELET
BASOS ABS: 0 10*3/uL (ref 0.0–0.1)
BASOS PCT: 0 %
Eosinophils Absolute: 0.1 10*3/uL (ref 0.0–0.7)
Eosinophils Relative: 1 %
HEMATOCRIT: 29.5 % — AB (ref 39.0–52.0)
Hemoglobin: 10.2 g/dL — ABNORMAL LOW (ref 13.0–17.0)
Lymphocytes Relative: 21 %
Lymphs Abs: 1.3 10*3/uL (ref 0.7–4.0)
MCH: 31.5 pg (ref 26.0–34.0)
MCHC: 34.6 g/dL (ref 30.0–36.0)
MCV: 91 fL (ref 78.0–100.0)
MONO ABS: 0.6 10*3/uL (ref 0.1–1.0)
Monocytes Relative: 9 %
NEUTROS ABS: 4.4 10*3/uL (ref 1.7–7.7)
NEUTROS PCT: 69 %
Platelets: 241 10*3/uL (ref 150–400)
RBC: 3.24 MIL/uL — AB (ref 4.22–5.81)
RDW: 12.5 % (ref 11.5–15.5)
WBC: 6.3 10*3/uL (ref 4.0–10.5)

## 2014-12-29 LAB — COMPREHENSIVE METABOLIC PANEL
ALBUMIN: 3.2 g/dL — AB (ref 3.5–5.0)
ALT: 36 U/L (ref 9–46)
ALT: 39 U/L (ref 17–63)
AST: 34 U/L (ref 10–35)
AST: 39 U/L (ref 15–41)
Albumin: 3.4 g/dL — ABNORMAL LOW (ref 3.6–5.1)
Alkaline Phosphatase: 77 U/L (ref 40–115)
Alkaline Phosphatase: 78 U/L (ref 38–126)
Anion gap: 7 (ref 5–15)
BILIRUBIN TOTAL: 0.7 mg/dL (ref 0.3–1.2)
BUN: 34 mg/dL — ABNORMAL HIGH (ref 7–25)
BUN: 31 mg/dL — AB (ref 6–20)
CALCIUM: 9.4 mg/dL (ref 8.6–10.3)
CHLORIDE: 108 mmol/L (ref 98–110)
CHLORIDE: 110 mmol/L (ref 101–111)
CO2: 23 mmol/L (ref 20–31)
CO2: 24 mmol/L (ref 22–32)
CREATININE: 1.46 mg/dL — AB (ref 0.70–1.25)
Calcium: 9.4 mg/dL (ref 8.9–10.3)
Creatinine, Ser: 1.51 mg/dL — ABNORMAL HIGH (ref 0.61–1.24)
GFR calc Af Amer: 54 mL/min — ABNORMAL LOW (ref >60)
GFR calc non Af Amer: 46 mL/min — ABNORMAL LOW (ref >60)
GLUCOSE: 103 mg/dL — AB (ref 65–99)
Glucose, Bld: 130 mg/dL — ABNORMAL HIGH (ref 65–99)
POTASSIUM: 4.8 mmol/L (ref 3.5–5.3)
POTASSIUM: 4.7 mmol/L (ref 3.5–5.1)
SODIUM: 140 mmol/L (ref 135–146)
Sodium: 141 mmol/L (ref 135–145)
TOTAL PROTEIN: 6.1 g/dL — AB (ref 6.5–8.1)
Total Bilirubin: 0.4 mg/dL (ref 0.2–1.2)
Total Protein: 5.9 g/dL — ABNORMAL LOW (ref 6.1–8.1)

## 2014-12-29 LAB — I-STAT TROPONIN, ED: Troponin i, poc: 0.53 ng/mL (ref 0.00–0.08)

## 2014-12-29 LAB — URINALYSIS, ROUTINE W REFLEX MICROSCOPIC
BILIRUBIN URINE: NEGATIVE
GLUCOSE, UA: NEGATIVE mg/dL
KETONES UR: NEGATIVE mg/dL
LEUKOCYTES UA: NEGATIVE
NITRITE: NEGATIVE
Protein, ur: >300 mg/dL — AB
Specific Gravity, Urine: 1.011 (ref 1.005–1.030)
pH: 6 (ref 5.0–8.0)

## 2014-12-29 LAB — POCT CBC
Granulocyte percent: 72.8 %G (ref 37–80)
HCT, POC: 30.5 % — AB (ref 43.5–53.7)
HEMOGLOBIN: 10.2 g/dL — AB (ref 14.1–18.1)
LYMPH, POC: 1.3 (ref 0.6–3.4)
MCH: 30.4 pg (ref 27–31.2)
MCHC: 33.3 g/dL (ref 31.8–35.4)
MCV: 91.3 fL (ref 80–97)
MID (CBC): 0.4 (ref 0–0.9)
MPV: 6.6 fL (ref 0–99.8)
PLATELET COUNT, POC: 278 10*3/uL (ref 142–424)
POC Granulocyte: 4.7 (ref 2–6.9)
POC LYMPH PERCENT: 20.4 %L (ref 10–50)
POC MID %: 6.8 %M (ref 0–12)
RBC: 3.35 M/uL — AB (ref 4.69–6.13)
RDW, POC: 12.9 %
WBC: 6.4 10*3/uL (ref 4.6–10.2)

## 2014-12-29 LAB — GLUCOSE, CAPILLARY
GLUCOSE-CAPILLARY: 95 mg/dL (ref 65–99)
Glucose-Capillary: 44 mg/dL — CL (ref 65–99)

## 2014-12-29 LAB — BRAIN NATRIURETIC PEPTIDE: B Natriuretic Peptide: 873.8 pg/mL — ABNORMAL HIGH (ref 0.0–100.0)

## 2014-12-29 LAB — URINE MICROSCOPIC-ADD ON

## 2014-12-29 LAB — I-STAT CHEM 8, ED
BUN: 33 mg/dL — ABNORMAL HIGH (ref 6–20)
CHLORIDE: 108 mmol/L (ref 101–111)
Calcium, Ion: 1.24 mmol/L (ref 1.13–1.30)
Creatinine, Ser: 1.5 mg/dL — ABNORMAL HIGH (ref 0.61–1.24)
Glucose, Bld: 97 mg/dL (ref 65–99)
HCT: 31 % — ABNORMAL LOW (ref 39.0–52.0)
Hemoglobin: 10.5 g/dL — ABNORMAL LOW (ref 13.0–17.0)
POTASSIUM: 4.7 mmol/L (ref 3.5–5.1)
SODIUM: 141 mmol/L (ref 135–145)
TCO2: 21 mmol/L (ref 0–100)

## 2014-12-29 LAB — HEPARIN LEVEL (UNFRACTIONATED): HEPARIN UNFRACTIONATED: 0.34 [IU]/mL (ref 0.30–0.70)

## 2014-12-29 LAB — GLUCOSE, POCT (MANUAL RESULT ENTRY): POC Glucose: 135 mg/dl — AB (ref 70–99)

## 2014-12-29 LAB — TROPONIN I: TROPONIN I: 0.45 ng/mL — AB (ref <0.031)

## 2014-12-29 MED ORDER — BENAZEPRIL HCL 40 MG PO TABS
40.0000 mg | ORAL_TABLET | Freq: Every day | ORAL | Status: DC
  Administered 2014-12-30 – 2014-12-31 (×2): 40 mg via ORAL
  Filled 2014-12-29 (×2): qty 1

## 2014-12-29 MED ORDER — MOMETASONE FURO-FORMOTEROL FUM 100-5 MCG/ACT IN AERO
2.0000 | INHALATION_SPRAY | Freq: Two times a day (BID) | RESPIRATORY_TRACT | Status: DC
  Administered 2014-12-29 – 2014-12-31 (×4): 2 via RESPIRATORY_TRACT
  Filled 2014-12-29: qty 8.8

## 2014-12-29 MED ORDER — ASPIRIN EC 81 MG PO TBEC
81.0000 mg | DELAYED_RELEASE_TABLET | Freq: Every day | ORAL | Status: DC
  Administered 2014-12-30 – 2014-12-31 (×2): 81 mg via ORAL
  Filled 2014-12-29 (×2): qty 1

## 2014-12-29 MED ORDER — IPRATROPIUM BROMIDE 0.02 % IN SOLN
0.5000 mg | Freq: Once | RESPIRATORY_TRACT | Status: AC
  Administered 2014-12-29: 0.5 mg via RESPIRATORY_TRACT

## 2014-12-29 MED ORDER — NAPROXEN SODIUM 220 MG PO TABS: 220.0000 mg | ORAL_TABLET | Freq: Two times a day (BID) | ORAL | Status: DC | PRN

## 2014-12-29 MED ORDER — ONDANSETRON HCL 4 MG/2ML IJ SOLN: 4.0000 mg | Freq: Four times a day (QID) | INTRAMUSCULAR | Status: DC | PRN

## 2014-12-29 MED ORDER — HYDRALAZINE HCL 25 MG PO TABS
25.0000 mg | ORAL_TABLET | Freq: Once | ORAL | Status: AC
  Administered 2014-12-29: 25 mg via ORAL
  Filled 2014-12-29: qty 1

## 2014-12-29 MED ORDER — HEPARIN (PORCINE) IN NACL 100-0.45 UNIT/ML-% IJ SOLN
1750.0000 [IU]/h | INTRAMUSCULAR | Status: DC
  Administered 2014-12-29 – 2014-12-30 (×2): 1400 [IU]/h via INTRAVENOUS
  Filled 2014-12-29 (×2): qty 250

## 2014-12-29 MED ORDER — HEPARIN BOLUS VIA INFUSION
4000.0000 [IU] | Freq: Once | INTRAVENOUS | Status: AC
  Administered 2014-12-29: 4000 [IU] via INTRAVENOUS
  Filled 2014-12-29: qty 4000

## 2014-12-29 MED ORDER — AMLODIPINE BESYLATE 10 MG PO TABS
10.0000 mg | ORAL_TABLET | Freq: Every day | ORAL | Status: DC
  Administered 2014-12-30 – 2014-12-31 (×2): 10 mg via ORAL
  Filled 2014-12-29 (×2): qty 1

## 2014-12-29 MED ORDER — VITAMIN D 1000 UNITS PO TABS
2000.0000 [IU] | ORAL_TABLET | Freq: Every day | ORAL | Status: DC
  Administered 2014-12-30 – 2014-12-31 (×2): 2000 [IU] via ORAL
  Filled 2014-12-29 (×2): qty 2

## 2014-12-29 MED ORDER — SODIUM CHLORIDE 0.9 % IJ SOLN: 3.0000 mL | INTRAMUSCULAR | Status: DC | PRN

## 2014-12-29 MED ORDER — ACETAMINOPHEN 325 MG PO TABS: 650.0000 mg | ORAL_TABLET | ORAL | Status: DC | PRN

## 2014-12-29 MED ORDER — FUROSEMIDE 10 MG/ML IJ SOLN
20.0000 mg | Freq: Once | INTRAMUSCULAR | Status: AC
  Administered 2014-12-29: 20 mg via INTRAVENOUS
  Filled 2014-12-29: qty 2

## 2014-12-29 MED ORDER — SODIUM CHLORIDE 0.9 % IJ SOLN
3.0000 mL | Freq: Two times a day (BID) | INTRAMUSCULAR | Status: DC
  Administered 2014-12-30 – 2014-12-31 (×3): 3 mL via INTRAVENOUS

## 2014-12-29 MED ORDER — HYDRALAZINE HCL 25 MG PO TABS
25.0000 mg | ORAL_TABLET | Freq: Two times a day (BID) | ORAL | Status: DC
  Administered 2014-12-29 – 2014-12-31 (×4): 25 mg via ORAL
  Filled 2014-12-29 (×4): qty 1

## 2014-12-29 MED ORDER — ALBUTEROL SULFATE (2.5 MG/3ML) 0.083% IN NEBU: 2.5000 mg | INHALATION_SOLUTION | Freq: Four times a day (QID) | RESPIRATORY_TRACT | Status: DC | PRN

## 2014-12-29 MED ORDER — CHOLECALCIFEROL 25 MCG (1000 UT) PO CAPS: 2000.0000 [IU] | ORAL_CAPSULE | Freq: Every day | ORAL | Status: DC

## 2014-12-29 MED ORDER — MONTELUKAST SODIUM 10 MG PO TABS
10.0000 mg | ORAL_TABLET | Freq: Every day | ORAL | Status: DC
  Administered 2014-12-30: 10 mg via ORAL
  Filled 2014-12-29: qty 1

## 2014-12-29 MED ORDER — INSULIN DETEMIR 100 UNIT/ML ~~LOC~~ SOLN
70.0000 [IU] | Freq: Two times a day (BID) | SUBCUTANEOUS | Status: DC
  Administered 2014-12-29: 70 [IU] via SUBCUTANEOUS
  Filled 2014-12-29 (×3): qty 0.7

## 2014-12-29 MED ORDER — ALBUTEROL SULFATE HFA 108 (90 BASE) MCG/ACT IN AERS: 1.0000 | INHALATION_SPRAY | Freq: Four times a day (QID) | RESPIRATORY_TRACT | Status: DC | PRN

## 2014-12-29 MED ORDER — SODIUM CHLORIDE 0.9 % IV SOLN: 250.0000 mL | INTRAVENOUS | Status: DC | PRN

## 2014-12-29 MED ORDER — ASPIRIN 81 MG PO CHEW
324.0000 mg | CHEWABLE_TABLET | Freq: Once | ORAL | Status: AC
  Administered 2014-12-29: 324 mg via ORAL
  Filled 2014-12-29: qty 4

## 2014-12-29 MED ORDER — ATORVASTATIN CALCIUM 10 MG PO TABS
10.0000 mg | ORAL_TABLET | Freq: Every day | ORAL | Status: DC
  Administered 2014-12-30 – 2014-12-31 (×2): 10 mg via ORAL
  Filled 2014-12-29 (×2): qty 1

## 2014-12-29 MED ORDER — ALBUTEROL SULFATE (2.5 MG/3ML) 0.083% IN NEBU
2.5000 mg | INHALATION_SOLUTION | Freq: Once | RESPIRATORY_TRACT | Status: AC
  Administered 2014-12-29: 2.5 mg via RESPIRATORY_TRACT

## 2014-12-29 MED ORDER — FUROSEMIDE 10 MG/ML IJ SOLN
40.0000 mg | Freq: Two times a day (BID) | INTRAMUSCULAR | Status: DC
  Administered 2014-12-29 – 2014-12-31 (×4): 40 mg via INTRAVENOUS
  Filled 2014-12-29 (×4): qty 4

## 2014-12-29 MED ORDER — NAPROXEN 250 MG PO TABS: 250.0000 mg | ORAL_TABLET | Freq: Two times a day (BID) | ORAL | Status: DC | PRN

## 2014-12-29 NOTE — Progress Notes (Signed)
[facility] [address] [place] [address] [phone]  Date:  12/29/2014   Name:  Henry Carroll   DOB:  06-Nov-1950   MRN:  [id number]  PCP:  Gildardo Cranker, DO    Chief Complaint: Shortness of Breath; Shoulder Pain; fluid in feet; and Hypertension   History of Present Illness:  This is a 64 y.o. male with PMH HTN, HLD, DM2 uncontrolled with CKD stage III, COPD, asthma who is presenting with LE edema and SOB x 3 days. SOB and wheezing started Sunday night. He thought it was an asthma attack. He took an albuterol neb and felt better and was able to go to sleep. When he woke he was again sob and wheezing. He noticed his feet were swelling, which is abnormal for him. He felt very fatigued and canceled his tee time for golf that he does every Monday. Yesterday he noticed his abdomen felt larger than normal. Walking down the driveway gets him very sob. He is not sob at rest. He sleeps on 2 pillows at night at baseline, no change. He denies coughing or other uri sx. He denies cp, palps. He is using albuterol neb three times a day with only short-term relief. He uses advair BID at baseline and uses albuterol very rarely. He has never had any heart problems. He had nuclear stress test 5 years ago at cardiologist office and negative. That's only card work up he has had.  Pt has elevated BP today. He states he ran out of chlorthalidone 3 days ago. It is at pharmacy but he hasn't picked up yet. He has been taking his amlodipine, benazepril and hydralazine.   PCP Dr. Eulas Post. Last saw 12/01/14 for CPE. At last visit he had levemir dose increased to 70 units BID. His a1c was 9.6 reduced from 10.4 three months prior. GFR 45.  Review of Systems:  Review of Systems See HPI  Patient Active Problem List   Diagnosis Date Noted  . Essential hypertension 10/09/2013  . DM type 2, uncontrolled, with renal complications ([place]) 123XX123  . Obesity (BMI 30-39.9) 05/05/2013  .  CKD (chronic kidney disease) stage 3, GFR 30-59 ml/min 02/18/2013  . COPD exacerbation ([place]) 01/14/2013  . Hypertensive renal disease with renal failure 01/14/2013  . Folliculitis of perineum 10/01/2012  . Acute upper respiratory infections of unspecified site 10/01/2012  . Anemia in chronic kidney disease 09/03/2012  . Other malaise and fatigue 09/03/2012  . 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 Hyperlipidemia with target LDL less than 100 06/04/2012  . Asthma, chronic 06/04/2012  . COLONIC POLYPS, HX OF 04/13/2009    Prior to Admission medications   Medication Sig Start Date End Date Taking? Authorizing Provider  albuterol (PROVENTIL) (2.5 MG/3ML) 0.083% nebulizer solution Take 3 mLs (2.5 mg total) by nebulization every 6 (six) hours as needed for wheezing or shortness of breath. 04/06/14  Yes Mahima Bubba Camp, MD  AMBULATORY NON FORMULARY MEDICATION One Touch Test Strips Test blood sugar three time daily for blood sugar Dx: 250.00 06/18/14  Yes Gildardo Cranker, DO  amLODipine (NORVASC) 10 MG tablet Take 1 tablet (10 mg total) by mouth daily. 12/01/14  Yes Gildardo Cranker, DO  aspirin EC 81 MG tablet Take 81 mg by mouth daily.   Yes Historical Provider, MD  atorvastatin (LIPITOR) 20 MG tablet Take 1/2 tablet by mouth once daily for cholesterol 08/27/14  Yes Gildardo Cranker, DO  BD PEN NEEDLE NANO U/F 32G X 4 MM MISC  08/06/12  Yes Historical Provider, MD  Cholecalciferol ([employer] VITAMIN D3) 1000 UNITS capsule Take 2 capsules (2,000 Units total) by mouth daily. 10/07/13  Yes Mahima Pandey, MD  Coenzyme Q10 (CO Q 10 PO) Take 1 tablet by mouth daily.   Yes Historical Provider, MD  Fluticasone-Salmeterol (ADVAIR DISKUS) 100-50 MCG/DOSE AEPB INHALE ONE DOSE BY MOUTH TWICE DAILY 05/26/14  Yes Gildardo Cranker, DO  hydrALAZINE (APRESOLINE) 25 MG tablet Take 1 tablet (25 mg total) by mouth 3 (three) times daily. 01/05/14  Yes Mahima Pandey, MD  insulin aspart (NOVOLOG) 100 UNIT/ML injection Inject 8 Units into  the skin 3 (three) times daily with meals. Inject 8 u insulin only for blood sugar greater than 180. Do not skip your meals 12/01/14  Yes Gildardo Cranker, DO  insulin detemir (LEVEMIR) 100 UNIT/ML injection Inject 0.6 mLs (60 Units total) into the skin 2 (two) times daily. 03/16/14  Yes Mahima Pandey, MD  montelukast (SINGULAIR) 10 MG tablet TAKE ONE TABLET BY MOUTH ONCE DAILY AT BEDTIME 12/28/14  Yes Monica Carter, DO  PROAIR HFA 108 (90 BASE) MCG/ACT inhaler INHALE TWO PUFFS BY MOUTH EVERY 6 HOURS AS NEEDED FOR SHORTNESS OF BREATH 06/18/14  Yes Gildardo Cranker, DO  Syringe, Disposable, 3 ML MISC Use syringe daily with Humalog, DX:250.00 01/14/13  Yes Mahima Pandey, MD  terbinafine (LAMISIL) 1 % cream Apply 1 application topically daily as needed.   Yes Historical Provider, MD  TRADJENTA 5 MG TABS tablet TAKE ONE TABLET BY MOUTH ONCE DAILY 10/22/14  Yes Gildardo Cranker, DO  benazepril (LOTENSIN) 40 MG tablet TAKE ONE TABLET BY MOUTH ONCE DAILY FOR BLOOD PRESSURE Patient not taking: Reported on 12/29/2014 12/28/14   Gildardo Cranker, DO  chlorthalidone (HYGROTON) 25 MG tablet TAKE TWO TABLETS BY MOUTH ONCE DAILY Patient not taking: Reported on 12/29/2014 05/28/14   Gildardo Cranker, DO    No Known Allergies  Past Surgical History  Procedure Laterality Date  . Tonsillectomy  1962  . Hernia repair  0000000    Umbilical  . Spine surgery  2006    L4 & L5  . Colon surgery  2011    Colonoscopy    Social History  Substance Use Topics  . Smoking status: Current Some Day Smoker    Types: Cigars  . Smokeless tobacco: Never Used  . Alcohol Use: 0.0 oz/week    0 Standard drinks or equivalent per week     Comment: socailly    Family History  Problem Relation Age of Onset  . Diabetes Mother   . Heart disease Father   . Stroke Sister     Medication list has been reviewed and updated.  Physical Examination:  Physical Exam  Constitutional: He is oriented to person, place, and time. He appears well-developed  and well-nourished. No distress.  HENT:  Head: Normocephalic and atraumatic.  Right Ear: Hearing, tympanic membrane, external ear and ear canal normal.  Left Ear: Hearing, tympanic membrane, external ear and ear canal normal.  Nose: Nose normal.  Mouth/Throat: Uvula is midline, oropharynx is clear and moist and mucous membranes are normal.  Eyes: Conjunctivae and lids are normal. Right eye exhibits no discharge. Left eye exhibits no discharge. No scleral icterus.  Neck: Trachea normal. Carotid bruit is not present.  Cardiovascular: Normal rate, regular rhythm, normal heart sounds and normal pulses.   No murmur heard. Pulmonary/Chest: Effort normal. No respiratory distress. He has wheezes (throughout). He has no rhonchi. He has no rales.  Abdominal: Bowel sounds are normal.  He exhibits distension. He exhibits no abdominal bruit. There is no tenderness.  JVD to level of jaw  Musculoskeletal: Normal range of motion.  Lymphadenopathy:    He has no cervical adenopathy.  Neurological: He is alert and oriented to person, place, and time.  Skin: Skin is warm, dry and intact.  Bilateral LE edema. 2+ to level of mid shin. 1+ to level of knee  Psychiatric: He has a normal mood and affect. His speech is normal and behavior is normal. Thought content normal.   BP 162/98 mmHg  Pulse 97  Temp(Src) 98.5 F (36.9 C) (Oral)  Resp 17  Ht 6\' 1"  (1.854 m)  Wt 265 lb (120.203 kg)  BMI 34.97 kg/m2  SpO2 98%  Results for orders placed or performed in visit on 12/29/14  POCT CBC  Result Value Ref Range   WBC 6.4 4.6 - 10.2 K/uL   Lymph, poc 1.3 0.6 - 3.4   POC LYMPH PERCENT 20.4 10 - 50 %L   MID (cbc) 0.4 0 - 0.9   POC MID % 6.8 0 - 12 %M   POC Granulocyte 4.7 2 - 6.9   Granulocyte percent 72.8 37 - 80 %G   RBC 3.35 (A) 4.69 - 6.13 M/uL   Hemoglobin 10.2 (A) 14.1 - 18.1 g/dL   HCT, POC 30.5 (A) 43.5 - 53.7 %   MCV 91.3 80 - 97 fL   MCH, POC 30.4 27 - 31.2 pg   MCHC 33.3 31.8 - 35.4 g/dL   RDW,  POC 12.9 %   Platelet Count, POC 278 142 - 424 K/uL   MPV 6.6 0 - 99.8 fL  POCT glucose (manual entry)  Result Value Ref Range   POC Glucose 135 (A) 70 - 99 mg/dl   EKG interpreted with Dr. Everlene Farrier: NSR, low voltage, new ST depression compared to EKG in 2015  [facility] reading (PRIMARY) by  Dr. Everlene Farrier: increased fluid at bases  Assessment and Plan:  1. Bilateral edema of lower extremity 2. Abdominal distension 3. Wheezing 4. CKD stage III 5. SOB 6. DM2 uncontrolled 7. Elevated BP 8. EKG abnormalities Concern for possible heart event and CHF. CXR showing fluid build up at bases. EKG with ST depression, new from EKG 2015. Labs below pending. After discussion with patient and wife, decided to transport to [facility] ED for further eval and monitoring. - Brain natriuretic peptide - albuterol (PROVENTIL) (2.5 MG/3ML) 0.083% nebulizer solution 2.5 mg; Take 3 mLs (2.5 mg total) by nebulization once. - ipratropium (ATROVENT) nebulizer solution 0.5 mg; Take 2.5 mLs (0.5 mg total) by nebulization once. - DG Chest 2 View; Future - POCT CBC - POCT CBC - EKG 12-Lead - POCT glucose (manual entry) - Comprehensive metabolic panel   Benjaman Pott. Drenda Freeze, MHS [facility]  12/29/2014

## 2014-12-29 NOTE — H&P (Signed)
Patient ID: Henry Carroll MRN: [id number], DOB/AGE: 1950-10-22   Admit date: 12/29/2014   Primary Physician: Gildardo Cranker, DO Primary Cardiologist: new   Pt. Profile:  64 year old African-American male with PMH of HTN, HLD, DM2, CKD stage III, COPD and long history of asthma presented with 3 days onset of DOE  Problem List  Past Medical History  Diagnosis Date  . Diabetes mellitus   . Allergy   . Asthma   . Hypertension   . Anemia   . Anxiety   . Hyperlipidemia   . Problems with sexual function     Past Surgical History  Procedure Laterality Date  . Tonsillectomy  1962  . Hernia repair  0000000    Umbilical  . Spine surgery  2006    L4 & L5  . Colon surgery  2011    Colonoscopy     Allergies  No Known Allergies  HPI  The patient is a very pleasant 64 year old African-American male with PMH of HTN, HLD, DM2, CKD stage III, COPD and long history of asthma. According to the patient, he was seen by cardiologist at [facility] cardiology 5 years ago and had a treadmill stress test which was negative. He also had a sleep study at the time which was also negative. He has not seen a cardiologist since. He appears to be very active at home going swimming, playing golf every Monday, and working in the yard. He denies any recent onset of exertional chest discomfort or any history of chest pain at all. He did note recent onset of exertional dyspnea. He ran out of his chlorthalidone on Friday 12/24/2014. His DOE worsened in the last 3 days since Sunday 12/26/2014. He noted he would become short of breath even going from the bathroom and back. He denies any chest discomfort, orthopnea or PND. He also noticed increasing lower extremity edema and abdominal girth. At first he thought it was his asthma, he tried to use albuterol at home without relief.  He finally decided to seek medical attention with his PCP in the morning of 11/16. His PCPs office was concerned about acute  onset of heart failure. EKG showed normal sinus rhythm with slight ST downsloping in V5 and V6. He was subsequently transferred to [facility] for further evaluation. Significant laboratory finding include creatinine of 1.5, BNP 873, hemoglobin 10.5. Troponin was elevated at 0.53. Chest x-ray shows increased density of both costophrenic angles suspicious for small pleural effusion and adjacent atelectasis. Cardiology has been consulted for heart failure and elevated troponin.   Home Medications  Prior to Admission medications   Medication Sig Start Date End Date Taking? Authorizing Provider  albuterol (PROVENTIL) (2.5 MG/3ML) 0.083% nebulizer solution Take 3 mLs (2.5 mg total) by nebulization every 6 (six) hours as needed for wheezing or shortness of breath. 04/06/14  Yes Mahima Pandey, MD  amLODipine (NORVASC) 10 MG tablet Take 1 tablet (10 mg total) by mouth daily. 12/01/14  Yes Gildardo Cranker, DO  aspirin EC 81 MG tablet Take 81 mg by mouth daily.   Yes Historical Provider, MD  atorvastatin (LIPITOR) 20 MG tablet Take 1/2 tablet by mouth once daily for cholesterol Patient taking differently: Take 10 mg by mouth daily at 12 noon. Take 1/2 tablet by mouth once daily for cholesterol 08/27/14  Yes Monica Carter, DO  benazepril (LOTENSIN) 40 MG tablet TAKE ONE TABLET BY MOUTH ONCE DAILY FOR BLOOD PRESSURE Patient taking differently: TAKE 40 MG BY MOUTH ONCE DAILY FOR  BLOOD PRESSURE 12/28/14  Yes Gildardo Cranker, DO  chlorthalidone (HYGROTON) 25 MG tablet TAKE TWO TABLETS BY MOUTH ONCE DAILY Patient taking differently: TAKE 50 MG BY MOUTH ONCE DAILY 05/28/14  Yes Gildardo Cranker, DO  Cholecalciferol ([employer] VITAMIN D3) 1000 UNITS capsule Take 2 capsules (2,000 Units total) by mouth daily. 10/07/13  Yes Mahima Pandey, MD  Coenzyme Q10 (CO Q 10 PO) Take 1 tablet by mouth daily.   Yes Historical Provider, MD  Fluticasone-Salmeterol (ADVAIR DISKUS) 100-50 MCG/DOSE AEPB INHALE ONE DOSE BY MOUTH TWICE  DAILY Patient taking differently: Inhale 1 puff into the lungs 2 (two) times daily. INHALE ONE DOSE BY MOUTH TWICE DAILY 05/26/14  Yes Gildardo Cranker, DO  hydrALAZINE (APRESOLINE) 25 MG tablet Take 1 tablet (25 mg total) by mouth 3 (three) times daily. Patient taking differently: Take 25 mg by mouth 2 (two) times daily.  01/05/14  Yes Mahima Pandey, MD  insulin aspart (NOVOLOG) 100 UNIT/ML injection Inject 8 Units into the skin 3 (three) times daily with meals. Inject 8 u insulin only for blood sugar greater than 180. Do not skip your meals Patient taking differently: Inject 8 Units into the skin 3 (three) times daily with meals as needed for high blood sugar. Inject 8 u insulin only for blood sugar greater than 180. Do not skip your meals 12/01/14  Yes Gildardo Cranker, DO  insulin detemir (LEVEMIR) 100 UNIT/ML injection Inject 0.6 mLs (60 Units total) into the skin 2 (two) times daily. Patient taking differently: Inject 70 Units into the skin 2 (two) times daily.  03/16/14  Yes Mahima Pandey, MD  montelukast (SINGULAIR) 10 MG tablet TAKE ONE TABLET BY MOUTH ONCE DAILY AT BEDTIME Patient taking differently: TAKE 10 MG BY MOUTH ONCE DAILY 12/28/14  Yes Gildardo Cranker, DO  naproxen sodium (ANAPROX) 220 MG tablet Take 220 mg by mouth 2 (two) times daily as needed (pain, headache).   Yes Historical Provider, MD  PROAIR HFA 108 (90 BASE) MCG/ACT inhaler INHALE TWO PUFFS BY MOUTH EVERY 6 HOURS AS NEEDED FOR SHORTNESS OF BREATH 06/18/14  Yes Gildardo Cranker, DO  terbinafine (LAMISIL) 1 % cream Apply 1 application topically daily as needed (foot).    Yes Historical Provider, MD  TRADJENTA 5 MG TABS tablet TAKE ONE TABLET BY MOUTH ONCE DAILY Patient taking differently: TAKE 5 MG BY MOUTH ONCE DAILY 10/22/14  Yes Gildardo Cranker, DO    Family History  Family History  Problem Relation Age of Onset  . Diabetes Mother   . Heart disease Father     heart failure later in life  . Stroke Sister   . Hypertension Father   .  Heart disease Mother     later in life, age >33    Social History  Social History   Social History  . Marital Status: Married    Spouse Name: N/A  . Number of Children: N/A  . Years of Education: N/A   Occupational History  . Not on file.   Social History Main Topics  . Smoking status: Current Some Day Smoker    Types: Cigars  . Smokeless tobacco: Never Used  . Alcohol Use: 0.0 oz/week    0 Standard drinks or equivalent per week     Comment: socailly  . Drug Use: No  . Sexual Activity: Yes    Birth Control/ Protection: None   Other Topics Concern  . Not on file   Social History Narrative     Review of Systems General:  No chills, fever, night sweats or weight changes.  Cardiovascular:  No chest pain, orthopnea, palpitations, paroxysmal nocturnal dyspnea. +dyspnea on exertion, edema Dermatological: No rash, lesions/masses Respiratory: No cough +dyspnea Urologic: No hematuria, dysuria Abdominal:   No nausea, vomiting, diarrhea, bright red blood per rectum, melena, or hematemesis Neurologic:  No visual changes, wkns, changes in mental status. All other systems reviewed and are otherwise negative except as noted above.  Physical Exam  Blood pressure 185/94, pulse 95, temperature 97.9 F (36.6 C), temperature source Oral, resp. rate 19, height 6\' 1"  (1.854 m), weight 254 lb (115.214 kg), SpO2 99 %.  General: Pleasant, NAD Psych: Normal affect. Neuro: Alert and oriented X 3. Moves all extremities spontaneously. HEENT: Normal  Neck: Supple without bruits  +JVD. Lungs:  Resp regular and unlabored. Largely CTA, markedly decreased breath sound near bilateral bases but no wheezing, rale or rhonchi. Heart: RRR no s3, s4, or murmurs. Abdomen: Soft, non-tender, BS + x 4. +distended Extremities: No clubbing, cyanosis. DP/PT/Radials 2+ and equal bilaterally. 1-2+ pitting edema in bilateral LE  Labs  Troponin (Point of Care Test)  Recent Labs  12/29/14 1528  TROPIPOC  0.53*   No results for input(s): CKTOTAL, CKMB, TROPONINI in the last 72 hours. Lab Results  Component Value Date   WBC 6.3 12/29/2014   HGB 10.5* 12/29/2014   HCT 31.0* 12/29/2014   MCV 91.0 12/29/2014   PLT 241 12/29/2014     Recent Labs Lab 12/29/14 1521 12/29/14 1530  NA 141 141  K 4.7 4.7  CL 110 108  CO2 24  --   BUN 31* 33*  CREATININE 1.51* 1.50*  CALCIUM 9.4  --   PROT 6.1*  --   BILITOT 0.7  --   ALKPHOS 78  --   ALT 39  --   AST 39  --   GLUCOSE 103* 97   Lab Results  Component Value Date   CHOL 153 11/30/2014   HDL 41 11/30/2014   LDLCALC 83 11/30/2014   TRIG 145 11/30/2014   No results found for: DDIMER   Radiology/Studies  Dg Chest 2 View  12/29/2014  CLINICAL DATA:  Lower extremity edema and shortness of breath. History of COPD/ asthma. Initial encounter. EXAM: CHEST  2 VIEW COMPARISON:  01/18/2012 and 12/16/2011 radiographs. FINDINGS: The heart size and mediastinal contours are stable. The lungs are hyperinflated. There are increased densities at both costophrenic angles which probably reflect combination of pleural fluid and basilar atelectasis. No consolidation or definite edema seen. There is no pneumothorax. The bones appear unchanged with prominent paraspinal osteophytes. IMPRESSION: Increased density of both costophrenic angle suspicious for small pleural effusions and adjacent atelectasis. Electronically Signed   By: Richardean Sale M.D.   On: 12/29/2014 16:13    ECG  Normal sinus rhythm with minimal ST downsloping in V5 and V6   ASSESSMENT AND PLAN  1. Dyspnea on exertion with evidence with R heart failure > L heart failure  - elevated JVD and 1-2+ pitting edema on exam. No obvious wheezing on exam to suggest COPD or asthma exacerbation  - admit for IV diuresis 40mg  IV lasix BID. Mildly tachycardic, likely related to ED, will check TSH in AM  2. Elevated trop without CP  - trend trop, unclear if demand ischemia from CKD and HF vs  NSTEMI (mild ST downsloping on EKG, nonspecific)   - if trop continue to increase, then DOE may be anginal equivalent and would have lower threshold for cath  -  will obtain echo, if EF low, would also consider cath  3. HTN: uncontrolled with SBP 180s in the ED, likely related to missed chlorithalidone (run out of the med on Fri 11/11)  - will keep him off chlorithalidone given need for lasix  - if BP still high with diuresis, will consider increase his hydralazine. Continue home amlodipine and benazepril   4. DM2 5. CKD stage III: baseline Cr 1.4 - 1.8, currently 1.5, will monitor with diuresis 6. COPD/asthma  Signed, Almyra Deforest, PA-C 12/29/2014, 5:13 PM  Personally seen and examined. Agree with above. Appears to have acute heart failure Will check U/A to exclude nephrotic syndrome OK to keep heparin IV (Trop 0.58). If flat, stop tomorrow Minimal blunting of breath sounds at bases.  +S4 Watch Creat (1.5-1.7).   Candee Furbish, MD

## 2014-12-29 NOTE — ED Notes (Signed)
IV attempt X2. 3rd attempt successful by [facility] RN

## 2014-12-29 NOTE — ED Notes (Signed)
EDP at beside  

## 2014-12-29 NOTE — ED Notes (Signed)
Pt presents with an increase in SOB. Pt seen at MD office, and was sent over for BLE swelling that started yesterday, and SOB that started on Saturday. Pt had to self administer 4/5 albuterol treatments on Saturday. Pt denies any CP. Per MD office pts EKG was abnormal. Pt is A/O.

## 2014-12-29 NOTE — ED Notes (Signed)
Heparin verified with Marthann Schiller RN

## 2014-12-29 NOTE — Progress Notes (Signed)
ANTICOAGULATION CONSULT NOTE - Initial Consult  Pharmacy Consult for heparin Indication: chest pain/ACS  No Known Allergies  Patient Measurements: Height: 6\' 1"  (185.4 cm) Weight: 254 lb (115.214 kg) IBW/kg (Calculated) : 79.9 Heparin Dosing Weight: 104.5 kg  Vital Signs: Temp: 97.9 F (36.6 C) (11/16 1509) Temp Source: Oral (11/16 1509) BP: 184/95 mmHg (11/16 1511) Pulse Rate: 96 (11/16 1511)  Labs:  Recent Labs  12/29/14 1331 12/29/14 1521 12/29/14 1530  HGB 10.2* 10.2* 10.5*  HCT 30.5* 29.5* 31.0*  PLT  --  241  --   CREATININE  --   --  1.50*    Estimated Creatinine Clearance: 64.4 mL/min (by C-G formula based on Cr of 1.5).   Medical History: Past Medical History  Diagnosis Date  . Diabetes mellitus   . Allergy   . Asthma   . Hypertension   . Anemia   . Anxiety   . Hyperlipidemia   . Problems with sexual function    Assessment: 64 yo m presenting to the ED on 11/16 with SOB.  Pharmacy is consulted to dose heparin for ACS.  Patient is not on any anticoagulation at home. Hgb 10.2, plts 241.  Goal of Therapy:  Heparin level 0.3-0.7 units/ml Monitor platelets by anticoagulation protocol: Yes   Plan:  Heparin bolus 4,000 units x 1 Heparin infusion 1400 units/hr  Daily HL, CBC 6-hr HL @ 2230 Monitor for s/sx of bleeding  Brenya Taulbee L. Nicole Kindred, PharmD Clinical Pharmacy Resident Pager: [phone] 12/29/2014 4:12 PM

## 2014-12-29 NOTE — Care Management Note (Signed)
Case Management Note  Patient Details  Name: Henry Carroll MRN: [id number] Date of Birth: 22-Aug-1950  Subjective/Objective:                  PMH of HTN, HLD, DM2, CKD stage III, COPD,  asthma presented with 3 days onset of DOE  Action/Plan: Patient lives at home with his spouse and states that prior to admission was independent. Member plays golf regularly and wants to get back to where he is able to play. Patient does not use any DME at home and does not anticipate needing any at home. Patient states that he has no trouble affording medications.   CM explained heart failure as pt had questions and he said the staff also was going to show him some videos. CM explained the importance of medication compliance, lifestyle modifications like diet, and follow up compliance with treatment providers. Wife and patient verbalized understanding. CM will follow for additional discharge planning needs.   Expected Discharge Date:                  Expected Discharge Plan:  Home/Self Care  In-House Referral:     Discharge planning Services  CM Consult  Post Acute Care Choice:    Choice offered to:     DME Arranged:    DME Agency:     HH Arranged:    HH Agency:     Status of Service:  In process, will continue to follow  Medicare Important Message Given:    Date Medicare IM Given:    Medicare IM give by:    Date Additional Medicare IM Given:    Additional Medicare Important Message give by:     If discussed at [place] of Stay Meetings, dates discussed:    Additional Comments:  Guido Sander, RN 12/29/2014, 9:29 PM

## 2014-12-29 NOTE — Progress Notes (Signed)
   12/29/14 1934  Vitals  Temp 98.1 F (36.7 C)  Temp Source Oral  BP (!) 162/92 mmHg  BP Location Right Arm  BP Method Automatic  Patient Position (if appropriate) Sitting  Pulse Rate 98  Pulse Rate Source Dinamap  Resp 18  Oxygen Therapy  SpO2 96 %  O2 Device Room Air  Height and Weight  Height 6\' 1"  (1.854 m)  Weight 116.665 kg (257 lb 3.2 oz) (scale b)  Type of Scale Used Standing  BSA (Calculated - sq m) 2.45 sq meters  BMI (Calculated) 34  Weight in (lb) to have BMI = 25 189.1  Admitted pt to rm 3E21 from ED, pt alert and oriented, denied pain at this time, oriented to room, call bell placed within reach, admission assessment done, orders carried out.

## 2014-12-29 NOTE — ED Notes (Signed)
Cards at bedside

## 2014-12-29 NOTE — Progress Notes (Signed)
ANTICOAGULATION CONSULT NOTE - Follow-up Consult  Pharmacy Consult for heparin Indication: chest pain/ACS  No Known Allergies  Patient Measurements: Height: 6\' 1"  (185.4 cm) Weight: 257 lb 3.2 oz (116.665 kg) (scale b) IBW/kg (Calculated) : 79.9 Heparin Dosing Weight: 104.5 kg  Vital Signs: Temp: 98.1 F (36.7 C) (11/16 2315) Temp Source: Oral (11/16 2315) BP: 180/100 mmHg (11/16 2315) Pulse Rate: 99 (11/16 2315)  Labs:  Recent Labs  12/29/14 1312 12/29/14 1331 12/29/14 1521 12/29/14 1530 12/29/14 2106 12/29/14 2230  HGB  --  10.2* 10.2* 10.5*  --   --   HCT  --  30.5* 29.5* 31.0*  --   --   PLT  --   --  241  --   --   --   HEPARINUNFRC  --   --   --   --   --  0.34  CREATININE 1.46*  --  1.51* 1.50*  --   --   TROPONINI  --   --   --   --  0.45*  --     Estimated Creatinine Clearance: 64.8 mL/min (by C-G formula based on Cr of 1.5).   Assessment: 64 yo M on heparin for r/o ACS.  Elevated troponin but no CP - demand ischemia vs NSTEMI. Heparin level therapeutic on 1400 units/hr. No bleeding noted.  Goal of Therapy:  Heparin level 0.3-0.7 units/ml Monitor platelets by anticoagulation protocol: Yes   Plan:  Continue heparin infusion 1400 units/hr  Daily HL, CBC  Sherlon Handing, PharmD, BCPS Clinical pharmacist, pager [phone] 12/29/2014 11:36 PM

## 2014-12-29 NOTE — Progress Notes (Signed)
   12/29/14 2315  Vitals  Temp 98.1 F (36.7 C)  Temp Source Oral  BP (!) 180/100 mmHg  BP Method Automatic  Patient Position (if appropriate) Sitting  Pulse Rate 99  Pulse Rate Source Dinamap  Resp 20  Oxygen Therapy  SpO2 99 %  O2 Device Room Air  Pt's BP is elevated, Dr. Aundra Dubin notified and ordered hydralazine 25mg  po once.

## 2014-12-29 NOTE — ED Provider Notes (Signed)
CSN: [id number]     Arrival date & time 12/29/14  1503 History   First MD Initiated Contact with Patient 12/29/14 1504     Chief Complaint  Patient presents with  . Shortness of Breath     (Consider location/radiation/quality/duration/timing/severity/associated sxs/prior Treatment) Patient is a 64 y.o. male presenting with shortness of breath. The history is provided by the patient.  Shortness of Breath Severity:  Severe Onset quality:  Gradual Duration:  3 days Timing:  Constant Progression:  Worsening Chronicity:  New Context: activity   Relieved by:  Rest Worsened by:  Exertion and movement Ineffective treatments:  None tried Associated symptoms: no abdominal pain, no chest pain, no fever, no headaches, no PND, no rash and no vomiting     64 yo M with a chief complaint of shortness of breath. This been going on for the past 3 days. Patient normally able to do work around the house without restriction, however patient had difficulty taking the trash to the curb. Have a stop multiple times in catch his breath. Denied any chest pain or pressure with this. Denied any diaphoresis nausea or vomiting. Patient has had significant lower extremity edema that is just started here recently. Patient has had decreased urine output as well. Normally takes chlorthalidone, and has been out for the past week. Patient denies history of heart failure. States that he had a nuclear stress test done about 5 years ago through the [facility] that was negative. denies any fevers or chills cough or congestion.  Denies recent surgery denies history of cancer denies testosterone use denies prolonged travel. Denies orthopnea or PND   Past Medical History  Diagnosis Date  . Diabetes mellitus   . Allergy   . Asthma   . Hypertension   . Anemia   . Anxiety   . Hyperlipidemia   . Problems with sexual function    Past Surgical History  Procedure Laterality Date  . Tonsillectomy  1962  .  Hernia repair  0000000    Umbilical  . Spine surgery  2006    L4 & L5  . Colon surgery  2011    Colonoscopy   Family History  Problem Relation Age of Onset  . Diabetes Mother   . Heart disease Father     heart failure later in life  . Stroke Sister   . Hypertension Father   . Heart disease Mother     later in life, age >41   Social History  Substance Use Topics  . Smoking status: Current Some Day Smoker    Types: Cigars  . Smokeless tobacco: Never Used  . Alcohol Use: 0.0 oz/week    0 Standard drinks or equivalent per week     Comment: socailly    Review of Systems  Constitutional: Negative for fever and chills.  HENT: Negative for congestion and facial swelling.   Eyes: Negative for discharge and visual disturbance.  Respiratory: Negative for shortness of breath.   Cardiovascular: Positive for leg swelling. Negative for chest pain, palpitations and PND.  Gastrointestinal: Negative for vomiting, abdominal pain and diarrhea.  Musculoskeletal: Negative for myalgias and arthralgias.  Skin: Negative for color change and rash.  Neurological: Negative for tremors, syncope and headaches.  Psychiatric/Behavioral: Negative for confusion and dysphoric mood.      Allergies  Review of patient's allergies indicates no known allergies.  Home Medications   Prior to Admission medications   Medication Sig Start Date End Date Taking? Authorizing Provider  albuterol (PROVENTIL) (2.5 MG/3ML) 0.083% nebulizer solution Take 3 mLs (2.5 mg total) by nebulization every 6 (six) hours as needed for wheezing or shortness of breath. 04/06/14  Yes Mahima Pandey, MD  amLODipine (NORVASC) 10 MG tablet Take 1 tablet (10 mg total) by mouth daily. 12/01/14  Yes Gildardo Cranker, DO  aspirin EC 81 MG tablet Take 81 mg by mouth daily.   Yes Historical Provider, MD  atorvastatin (LIPITOR) 20 MG tablet Take 1/2 tablet by mouth once daily for cholesterol Patient taking differently: Take 10 mg by mouth daily at  12 noon. Take 1/2 tablet by mouth once daily for cholesterol 08/27/14  Yes Monica Carter, DO  benazepril (LOTENSIN) 40 MG tablet TAKE ONE TABLET BY MOUTH ONCE DAILY FOR BLOOD PRESSURE Patient taking differently: TAKE 40 MG BY MOUTH ONCE DAILY FOR BLOOD PRESSURE 12/28/14  Yes Gildardo Cranker, DO  chlorthalidone (HYGROTON) 25 MG tablet TAKE TWO TABLETS BY MOUTH ONCE DAILY Patient taking differently: TAKE 50 MG BY MOUTH ONCE DAILY 05/28/14  Yes Gildardo Cranker, DO  Cholecalciferol ([employer] VITAMIN D3) 1000 UNITS capsule Take 2 capsules (2,000 Units total) by mouth daily. 10/07/13  Yes Mahima Pandey, MD  Coenzyme Q10 (CO Q 10 PO) Take 1 tablet by mouth daily.   Yes Historical Provider, MD  Fluticasone-Salmeterol (ADVAIR DISKUS) 100-50 MCG/DOSE AEPB INHALE ONE DOSE BY MOUTH TWICE DAILY Patient taking differently: Inhale 1 puff into the lungs 2 (two) times daily. INHALE ONE DOSE BY MOUTH TWICE DAILY 05/26/14  Yes Gildardo Cranker, DO  hydrALAZINE (APRESOLINE) 25 MG tablet Take 1 tablet (25 mg total) by mouth 3 (three) times daily. Patient taking differently: Take 25 mg by mouth 2 (two) times daily.  01/05/14  Yes Mahima Pandey, MD  insulin aspart (NOVOLOG) 100 UNIT/ML injection Inject 8 Units into the skin 3 (three) times daily with meals. Inject 8 u insulin only for blood sugar greater than 180. Do not skip your meals Patient taking differently: Inject 8 Units into the skin 3 (three) times daily with meals as needed for high blood sugar. Inject 8 u insulin only for blood sugar greater than 180. Do not skip your meals 12/01/14  Yes Gildardo Cranker, DO  insulin detemir (LEVEMIR) 100 UNIT/ML injection Inject 0.6 mLs (60 Units total) into the skin 2 (two) times daily. Patient taking differently: Inject 70 Units into the skin 2 (two) times daily.  03/16/14  Yes Mahima Pandey, MD  montelukast (SINGULAIR) 10 MG tablet TAKE ONE TABLET BY MOUTH ONCE DAILY AT BEDTIME Patient taking differently: TAKE 10 MG BY MOUTH ONCE DAILY 12/28/14   Yes Gildardo Cranker, DO  naproxen sodium (ANAPROX) 220 MG tablet Take 220 mg by mouth 2 (two) times daily as needed (pain, headache).   Yes Historical Provider, MD  PROAIR HFA 108 (90 BASE) MCG/ACT inhaler INHALE TWO PUFFS BY MOUTH EVERY 6 HOURS AS NEEDED FOR SHORTNESS OF BREATH 06/18/14  Yes Gildardo Cranker, DO  terbinafine (LAMISIL) 1 % cream Apply 1 application topically daily as needed (foot).    Yes Historical Provider, MD  TRADJENTA 5 MG TABS tablet TAKE ONE TABLET BY MOUTH ONCE DAILY Patient taking differently: TAKE 5 MG BY MOUTH ONCE DAILY 10/22/14  Yes Monica Carter, DO   BP 176/95 mmHg  Pulse 96  Temp(Src) 97.9 F (36.6 C) (Oral)  Resp 19  Ht 6\' 1"  (1.854 m)  Wt 254 lb (115.214 kg)  BMI 33.52 kg/m2  SpO2 98% Physical Exam  Constitutional: He is oriented to person, place, and  time. He appears well-developed and well-nourished.  HENT:  Head: Normocephalic and atraumatic.  jvd up to the angle of the jaw   Eyes: EOM are normal. Pupils are equal, round, and reactive to light.  Neck: Normal range of motion. Neck supple. No JVD present.  Cardiovascular: Normal rate and regular rhythm.  Exam reveals no gallop and no friction rub.   No murmur heard. Pulmonary/Chest: No respiratory distress. He has no wheezes. He has no rales.  Clear lungs, no s3  Abdominal: He exhibits no distension. There is no tenderness. There is no rebound and no guarding.  Musculoskeletal: Normal range of motion. He exhibits edema (bilateral lower extremities). He exhibits no tenderness.  Neurological: He is alert and oriented to person, place, and time.  Skin: No rash noted. No pallor.  Psychiatric: He has a normal mood and affect. His behavior is normal.  Nursing note and vitals reviewed.   ED Course  Procedures (including critical care time) Labs Review Labs Reviewed  CBC WITH DIFFERENTIAL/PLATELET - Abnormal; Notable for the following:    RBC 3.24 (*)    Hemoglobin 10.2 (*)    HCT 29.5 (*)    All other  components within normal limits  COMPREHENSIVE METABOLIC PANEL - Abnormal; Notable for the following:    Glucose, Bld 103 (*)    BUN 31 (*)    Creatinine, Ser 1.51 (*)    Total Protein 6.1 (*)    Albumin 3.2 (*)    GFR calc non Af Amer 46 (*)    GFR calc Af Amer 54 (*)    All other components within normal limits  BRAIN NATRIURETIC PEPTIDE - Abnormal; Notable for the following:    B Natriuretic Peptide 873.8 (*)    All other components within normal limits  I-STAT TROPOININ, ED - Abnormal; Notable for the following:    Troponin i, poc 0.53 (*)    All other components within normal limits  I-STAT CHEM 8, ED - Abnormal; Notable for the following:    BUN 33 (*)    Creatinine, Ser 1.50 (*)    Hemoglobin 10.5 (*)    HCT 31.0 (*)    All other components within normal limits  HEPARIN LEVEL (UNFRACTIONATED)    Imaging Review Dg Chest 2 View  12/29/2014  CLINICAL DATA:  Lower extremity edema and shortness of breath. History of COPD/ asthma. Initial encounter. EXAM: CHEST  2 VIEW COMPARISON:  01/18/2012 and 12/16/2011 radiographs. FINDINGS: The heart size and mediastinal contours are stable. The lungs are hyperinflated. There are increased densities at both costophrenic angles which probably reflect combination of pleural fluid and basilar atelectasis. No consolidation or definite edema seen. There is no pneumothorax. The bones appear unchanged with prominent paraspinal osteophytes. IMPRESSION: Increased density of both costophrenic angle suspicious for small pleural effusions and adjacent atelectasis. Electronically Signed   By: Richardean Sale M.D.   On: 12/29/2014 16:13   I have personally reviewed and evaluated these images and lab results as part of my medical decision-making.   EKG Interpretation   Date/Time:  Wednesday December 29 2014 15:05:02 EST Ventricular Rate:  100 PR Interval:  152 QRS Duration: 87 QT Interval:  363 QTC Calculation: 468 R Axis:   -34 Text Interpretation:   Sinus tachycardia Left axis deviation downsloping st  segments in leads v5,6 changed since 2005 Confirmed by Dazia Lippold MD, DANIEL  [phone]) on 12/29/2014 3:08:30 PM      MDM   Final diagnoses:  NSTEMI (non-ST elevated myocardial  infarction) ([place])  Congestive heart failure, unspecified congestive heart failure chronicity, unspecified congestive heart failure type [facility])    64 yo M with a chief complaint of shortness breath on exertion as well as bilateral lower extremity edema.  This been going on for the past 3 days. No history of heart failure. clinically with bilateral pitting edema JVD to the angle of jaw.  Chest x-ray with increased vascularity, will obtain, CBC, trop, cmp.  EKG with downward sloping st segments in leads v5, 6.   Likely new onset heart failure, istat chem 8 to eval renal function and K.    Troponin elevated to 0.5. Patient currently not having any symptoms. Question MI 3 days ago, will give aspirin start on heparin cards consult.    CRITICAL CARE Performed by: Cecilio Asper   Total critical care time: 35 minutes  Critical care time was exclusive of separately billable procedures and treating other patients.  Critical care was necessary to treat or prevent imminent or life-threatening deterioration.  Critical care was time spent personally by me on the following activities: development of treatment plan with patient and/or surrogate as well as nursing, discussions with consultants, evaluation of patient's response to treatment, examination of patient, obtaining history from patient or surrogate, ordering and performing treatments and interventions, ordering and review of laboratory studies, ordering and review of radiographic studies, pulse oximetry and re-evaluation of patient's condition.  The patients results and plan were reviewed and discussed.   Any x-rays performed were independently reviewed by myself.   Differential diagnosis were considered with  the presenting HPI.  Medications  heparin ADULT infusion 100 units/mL (25000 units/250 mL) (1,400 Units/hr Intravenous New Bag/Given 12/29/14 1631)  furosemide (LASIX) injection 20 mg (not administered)  aspirin chewable tablet 324 mg (324 mg Oral Given 12/29/14 1600)  heparin bolus via infusion 4,000 Units (4,000 Units Intravenous Given 12/29/14 1632)    Filed Vitals:   12/29/14 1507 12/29/14 1509 12/29/14 1511 12/29/14 1600  BP:  184/95 184/95 176/95  Pulse:  97 96 96  Temp:  97.9 F (36.6 C)    TempSrc:  Oral    Resp:  18 18 19   Height:  6\' 1"  (1.854 m)    Weight:  254 lb (115.214 kg)    SpO2: 98% 96% 97% 98%    Final diagnoses:  NSTEMI (non-ST elevated myocardial infarction) (H[facility])  Congestive heart failure, unspecified congestive heart failure chronicity, unspecified congestive heart failure type [facility])    Admission/ observation were discussed with the admitting physician, patient and/or family and they are comfortable with the plan.    Deno Etienne, DO 12/29/14 1715

## 2014-12-30 ENCOUNTER — Inpatient Hospital Stay (HOSPITAL_COMMUNITY): Payer: Medicare Other

## 2014-12-30 LAB — CBC
HCT: 29.7 % — ABNORMAL LOW (ref 39.0–52.0)
HEMOGLOBIN: 10 g/dL — AB (ref 13.0–17.0)
MCH: 30.7 pg (ref 26.0–34.0)
MCHC: 33.7 g/dL (ref 30.0–36.0)
MCV: 91.1 fL (ref 78.0–100.0)
Platelets: 247 10*3/uL (ref 150–400)
RBC: 3.26 MIL/uL — ABNORMAL LOW (ref 4.22–5.81)
RDW: 12.6 % (ref 11.5–15.5)
WBC: 6.9 10*3/uL (ref 4.0–10.5)

## 2014-12-30 LAB — GLUCOSE, CAPILLARY
Glucose-Capillary: 40 mg/dL — CL (ref 65–99)
Glucose-Capillary: 83 mg/dL (ref 65–99)
Glucose-Capillary: 182 mg/dL — ABNORMAL HIGH (ref 65–99)
Glucose-Capillary: 151 mg/dL — ABNORMAL HIGH (ref 65–99)

## 2014-12-30 LAB — HEPARIN LEVEL (UNFRACTIONATED): Heparin Unfractionated: 0.14 IU/mL — ABNORMAL LOW (ref 0.30–0.70)

## 2014-12-30 LAB — BASIC METABOLIC PANEL
Anion gap: 7 (ref 5–15)
BUN: 31 mg/dL — AB (ref 6–20)
CALCIUM: 9.3 mg/dL (ref 8.9–10.3)
CO2: 24 mmol/L (ref 22–32)
CREATININE: 1.47 mg/dL — AB (ref 0.61–1.24)
Chloride: 108 mmol/L (ref 101–111)
GFR calc Af Amer: 56 mL/min — ABNORMAL LOW (ref >60)
GFR calc non Af Amer: 48 mL/min — ABNORMAL LOW (ref >60)
GLUCOSE: 130 mg/dL — AB (ref 65–99)
Potassium: 4.3 mmol/L (ref 3.5–5.1)
Sodium: 139 mmol/L (ref 135–145)

## 2014-12-30 LAB — TROPONIN I
TROPONIN I: 0.44 ng/mL — AB (ref <0.031)
Troponin I: 0.4 ng/mL — ABNORMAL HIGH (ref <0.031)

## 2014-12-30 LAB — TSH: TSH: 1.603 u[IU]/mL (ref 0.350–4.500)

## 2014-12-30 LAB — BRAIN NATRIURETIC PEPTIDE: BRAIN NATRIURETIC PEPTIDE: 574.1 pg/mL — AB (ref 0.0–100.0)

## 2014-12-30 MED ORDER — CARVEDILOL 3.125 MG PO TABS
3.1250 mg | ORAL_TABLET | Freq: Two times a day (BID) | ORAL | Status: DC
  Administered 2014-12-31: 3.125 mg via ORAL
  Filled 2014-12-30: qty 1

## 2014-12-30 MED ORDER — HEPARIN SODIUM (PORCINE) 5000 UNIT/ML IJ SOLN
5000.0000 [IU] | Freq: Three times a day (TID) | INTRAMUSCULAR | Status: DC
  Administered 2014-12-30 – 2014-12-31 (×4): 5000 [IU] via SUBCUTANEOUS
  Filled 2014-12-30 (×4): qty 1

## 2014-12-30 MED ORDER — INSULIN DETEMIR 100 UNIT/ML ~~LOC~~ SOLN
50.0000 [IU] | Freq: Two times a day (BID) | SUBCUTANEOUS | Status: DC
  Administered 2014-12-30 – 2014-12-31 (×2): 50 [IU] via SUBCUTANEOUS
  Filled 2014-12-30 (×3): qty 0.5

## 2014-12-30 MED ORDER — CARVEDILOL 3.125 MG PO TABS
3.1250 mg | ORAL_TABLET | Freq: Two times a day (BID) | ORAL | Status: DC
  Administered 2014-12-30 (×2): 3.125 mg via ORAL
  Filled 2014-12-30 (×2): qty 1

## 2014-12-30 NOTE — Progress Notes (Signed)
Heart Failure Navigator Consult Note  Presentation: Henry Carroll  is a 64 year old African-American male with PMH of HTN, HLD, DM2, CKD stage III, COPD and long history of asthma presented with 3 days onset of DOE  Past Medical History  Diagnosis Date  . Diabetes mellitus   . Allergy   . Asthma   . Hypertension   . Anemia   . Anxiety   . Hyperlipidemia   . Problems with sexual function     Social History   Social History  . Marital Status: Married    Spouse Name: N/A  . Number of Children: N/A  . Years of Education: N/A   Social History Main Topics  . Smoking status: Current Some Day Smoker    Types: Cigars  . Smokeless tobacco: Never Used  . Alcohol Use: 0.0 oz/week    0 Standard drinks or equivalent per week     Comment: socailly  . Drug Use: No  . Sexual Activity: Yes    Birth Control/ Protection: None   Other Topics Concern  . None   Social History Narrative    ECHO:pending  BNP    Component Value Date/Time   BNP 873.8* 12/29/2014 1521    ProBNP No results found for: PROBNP   Education Assessment and Provision:  Detailed education and instructions provided on heart failure disease management including the following:  Signs and symptoms of Heart Failure When to call the physician Importance of daily weights Low sodium diet Fluid restriction Medication management Anticipated future follow-up appointments  Patient education given on each of the above topics.  Patient acknowledges understanding and acceptance of all instructions.  I spoke with [name] regarding his HF.  He says that he has never had HF before.  He does have a scale and we discussed the importance of daily weights as well as how weight gains relate to signs and symptoms of HF.  We discussed a low sodium diet and high sodium foods to avoid.  He admits that he "loves " bacon.  I encouraged eating processed meats very sparingly and limit sodium intake to 2000 mg per day.  He  denies any issues getting or taking prescribed medications however does admit that he had been out of one "BP medication" because the pharmacy did not have it available when he was there to get refills.  I have reinforced the need to be compliant with medications and to contact the physician with any issues related to any issues getting his medications.  He lives in [place] with his wife and has a daughter that lives locally as well.  He will [facility] with [facility] after discharge.    Education Materials:  "Living Better With Heart Failure" Booklet, Daily Weight Tracker Tool    High Risk Criteria for Readmission and/or Poor Patient Outcomes:   EF <30%- pending  2 or more admissions in 6 months-No  Difficult social situation- No  Demonstrates medication noncompliance- denies- however was out of a "BP med" before admission    Barriers of Care:  Knowledge and compliance  Discharge Planning:   Plans to return to [place] with his wife

## 2014-12-30 NOTE — Progress Notes (Signed)
Utilization review completed. Densil Ottey, RN, BSN. 

## 2014-12-30 NOTE — Progress Notes (Signed)
Hypoglycemic Event  CBG: 40  Treatment: 1 cup orange juice  Symptoms: none  Follow-up CBG: Time 0643 CBG Result:83  Possible Reasons for Event: pt got 70 units of levemir last night  Comments/MD notified:protocol followed    Henry Carroll

## 2014-12-30 NOTE — Progress Notes (Signed)
ANTICOAGULATION CONSULT NOTE - Follow-up Consult  Pharmacy Consult for heparin Indication: chest pain/ACS  No Known Allergies  Patient Measurements: Height: 6\' 1"  (185.4 cm) Weight: 257 lb 3.2 oz (116.665 kg) (scale b) IBW/kg (Calculated) : 79.9 Heparin Dosing Weight: 104.5 kg  Vital Signs: Temp: 98.1 F (36.7 C) (11/16 2315) Temp Source: Oral (11/16 2315) BP: 180/100 mmHg (11/16 2315) Pulse Rate: 99 (11/16 2315)  Labs:  Recent Labs  12/29/14 1312  12/29/14 1521 12/29/14 1530 12/29/14 2106 12/29/14 2230 12/30/14 0229  HGB  --   < > 10.2* 10.5*  --   --  10.0*  HCT  --   < > 29.5* 31.0*  --   --  29.7*  PLT  --   --  241  --   --   --  247  HEPARINUNFRC  --   --   --   --   --  0.34 0.14*  CREATININE 1.46*  --  1.51* 1.50*  --   --   --   TROPONINI  --   --   --   --  0.45*  --   --   < > = values in this interval not displayed.  Estimated Creatinine Clearance: 64.8 mL/min (by C-G formula based on Cr of 1.5).   Assessment: 64 yo M on heparin for r/o ACS.  Elevated troponin but no CP - demand ischemia vs NSTEMI. Heparin level now down to subtherapeutic (0.14) on 1400 units/hr. No issues with line or bleeding reported per RN.  Goal of Therapy:  Heparin level 0.3-0.7 units/ml Monitor platelets by anticoagulation protocol: Yes   Plan:  Increase heparin infusion to 1750 units/hr  Will f/u 6 hr heparin level   Sherlon Handing, PharmD, BCPS Clinical pharmacist, pager [phone] 12/30/2014 3:33 AM

## 2014-12-30 NOTE — Progress Notes (Signed)
Inpatient Diabetes Program Recommendations  AACE/ADA: New Consensus Statement on Inpatient Glycemic Control (2015)  Target Ranges:  Prepandial:   less than 140 mg/dL      Peak postprandial:   less than 180 mg/dL (1-2 hours)      Critically ill patients:  140 - 180 mg/dL   Review of Glycemic Control  Diabetes history: DM 2 Outpatient Diabetes medications: Levemir 70 units BID, Novolog 8 units TID meal coverage, Tradjenta 5 mg Daily Current orders for Inpatient glycemic control: Levemir 70 units BID  Inpatient Diabetes Program Recommendations: Insulin - Basal: Patient had hypoglycemia the past 2 mornings in the 40's. Please decrease Levemir to 55 units BID. Insulin - Correction: While inpatient consider ordering Novolog Sensitive Correction + HS coverage.  Thanks,  Tama Headings RN, MSN, [facility] Inpatient Diabetes Coordinator Team Pager [phone] (8a-5p)

## 2014-12-30 NOTE — Evaluation (Signed)
  Occupational Therapy Evaluation Patient Details Name: Henry Carroll MRN: [id number] DOB: Oct 19, 1950 Today's Date: 12/30/2014    History of Present Illness 64 year old African-American male with PMH of HTN, HLD, DM2, CKD stage III, COPD and long history of asthma presented with 3 days onset of DOE   Clinical Impression   Patient admitted with above. Patient independent PTA. Patient currently functioning at an independent level.  No additional OT needs identified, D/C from acute OT services and no additional follow-up OT needs at this time. All appropriate education provided to patient. Please re-order OT if needed.      Follow Up Recommendations  No OT follow up    Equipment Recommendations  None recommended by OT    Recommendations for Other Services  None at this time    Precautions / Restrictions Precautions Precautions: Fall Restrictions Weight Bearing Restrictions: No    Mobility Bed Mobility Overal bed mobility: Independent  Transfers Overall transfer level: Independent Equipment used: None    Balance Overall balance assessment: No apparent balance deficits (not formally assessed)    ADL Overall ADL's : At baseline General ADL Comments: No AD needed, pt safe and effective with mobility and transfers.  Administered energy conservation handout and went over some specific energy conservation techniques with patient.     Pertinent Vitals/Pain Pain Assessment: No/denies pain     Hand Dominance Right   Extremity/Trunk Assessment Upper Extremity Assessment Upper Extremity Assessment: Overall WFL for tasks assessed   Lower Extremity Assessment Lower Extremity Assessment: Overall WFL for tasks assessed   Cervical / Trunk Assessment Cervical / Trunk Assessment: Normal   Communication Communication Communication: No difficulties   Cognition Arousal/Alertness: Awake/alert Behavior During Therapy: WFL for tasks assessed/performed Overall Cognitive Status:  Within Functional Limits for tasks assessed              Home Living Family/patient expects to be discharged to:: Private residence Living Arrangements: Spouse/significant other Available Help at Discharge: Family;Available 24 hours/day (wifre retired) Type of Home: House Home Access: Stairs to enter Technical brewer of Steps: [place]: Two level;Able to live on main level with bedroom/bathroom     Bathroom Shower/Tub: Tub/shower unit;Curtain   Bathroom Toilet: Standard     Home Equipment: None    Prior Functioning/Environment Level of Independence: Independent      OT Diagnosis: Generalized weakness   OT Problem List:  n/a, no acute OT needs identified    OT Treatment/Interventions:   n/a, no acute OT needs identified    OT Goals(Current goals can be found in the care plan section) Acute Rehab OT Goals Patient Stated Goal: go home OT Goal Formulation: All assessment and education complete, DC therapy  OT Frequency:  n/a, no acute OT needs identified    Barriers to D/C:  None known at this time    End of Session Activity Tolerance: Patient tolerated treatment well Patient left: in bed;with call bell/phone within reach   Time: [id number] OT Time Calculation (min): 9 min Charges:  OT General Charges $OT Visit: 1 Procedure OT Evaluation $Initial OT Evaluation Tier I: 1 Procedure  Shmuel Girgis , MS, OTR/L, CLT Pager: [phone]  12/30/2014, 4:00 PM

## 2014-12-30 NOTE — Progress Notes (Signed)
    Subjective:  Much  Better. Can I go.   Objective:  Vital Signs in the last 24 hours: Temp:  [97.3 F (36.3 C)-98.5 F (36.9 C)] 97.3 F (36.3 C) (11/17 0846) Pulse Rate:  [93-108] 93 (11/17 0846) Resp:  [15-22] 18 (11/17 0846) BP: (155-185)/(83-101) 171/83 mmHg (11/17 0846) SpO2:  [95 %-99 %] 98 % (11/17 0846) Weight:  [253 lb 6.4 oz (114.941 kg)-265 lb (120.203 kg)] 253 lb 6.4 oz (114.941 kg) (11/17 0359)  Intake/Output from previous day: 11/16 0701 - 11/17 0700 In: 916.1 [P.O.:720; I.V.:196.1] Out: 3350 [Urine:3350]   Physical Exam: General: Well developed, well nourished, in no acute distress. Head:  Normocephalic and atraumatic. Lungs: Clear to auscultation and percussion. Heart: Normal S1 and S2.  No murmur, rubs or gallops.  Abdomen: soft, non-tender, positive bowel sounds. Obese Extremities: No clubbing or cyanosis. 1+ BLE edema. Neurologic: Alert and oriented x 3.    Lab Results:  Recent Labs  12/29/14 1521 12/29/14 1530 12/30/14 0229  WBC 6.3  --  6.9  HGB 10.2* 10.5* 10.0*  PLT 241  --  247    Recent Labs  12/29/14 1521 12/29/14 1530 12/30/14 0229  NA 141 141 139  K 4.7 4.7 4.3  CL 110 108 108  CO2 24  --  24  GLUCOSE 103* 97 130*  BUN 31* 33* 31*  CREATININE 1.51* 1.50* 1.47*    Recent Labs  12/30/14 0229 12/30/14 0748  TROPONINI 0.40* 0.44*   Hepatic Function Panel  Recent Labs  12/29/14 1521  PROT 6.1*  ALBUMIN 3.2*  AST 39  ALT 39  ALKPHOS 78  BILITOT 0.7   No results for input(s): CHOL in the last 72 hours. No results for input(s): PROTIME in the last 72 hours.  Imaging: Dg Chest 2 View  12/29/2014  CLINICAL DATA:  Lower extremity edema and shortness of breath. History of COPD/ asthma. Initial encounter. EXAM: CHEST  2 VIEW COMPARISON:  01/18/2012 and 12/16/2011 radiographs. FINDINGS: The heart size and mediastinal contours are stable. The lungs are hyperinflated. There are increased densities at both costophrenic  angles which probably reflect combination of pleural fluid and basilar atelectasis. No consolidation or definite edema seen. There is no pneumothorax. The bones appear unchanged with prominent paraspinal osteophytes. IMPRESSION: Increased density of both costophrenic angle suspicious for small pleural effusions and adjacent atelectasis. Electronically Signed   By: Richardean Sale M.D.   On: 12/29/2014 16:13   Personally viewed.   Telemetry: No VT, NSR. Personally viewed.   EKG:  NSSTW changes  Cardiac Studies:  ECHO P  Assessment/Plan:  Principal Problem:   Acute heart failure (HCC) Active Problems:   Hyperlipidemia with target LDL less than 100   CKD (chronic kidney disease) stage 3, GFR 30-59 ml/min   Essential hypertension   Morbid obesity (HCC)   Acute on chronic diastolic heart failure (HCC)   - Await ECHO  - Continue IV lasix - good response  - Decrease Levemir to 50 BID from 70 (glucose 40 this AM)  - Weight loss  - Watch creat  - Continue ACE-I, hydral as well, norvasc 10  - Will start coreg 3.125 BID. Increase tomorrow.   - Off IV heparin (trop low level - Demand ischemia in setting of heart failure). Change to DVT proph dose.   Candee Furbish, MD      Tempie Gibeault, [place] 12/30/2014, 11:05 AM

## 2014-12-30 NOTE — Progress Notes (Signed)
   12/30/14 0359  Vitals  Temp 98 F (36.7 C)  Temp Source Oral  BP (!) 179/95 mmHg  BP Location Right Arm  BP Method Automatic  Patient Position (if appropriate) Lying  Pulse Rate 96  Pulse Rate Source Dinamap  Resp 18  Oxygen Therapy  SpO2 98 %  O2 Device Room Air  Height and Weight  Weight 114.941 kg (253 lb 6.4 oz) (scale b)  Type of Scale Used Standing  Pt's BP still elevated, Dr. Aundra Dubin notified, awaiting call back. Will monitor.

## 2014-12-31 ENCOUNTER — Inpatient Hospital Stay (HOSPITAL_COMMUNITY): Payer: Medicare Other

## 2014-12-31 ENCOUNTER — Encounter (HOSPITAL_COMMUNITY): Payer: Self-pay | Admitting: Physician Assistant

## 2014-12-31 ENCOUNTER — Telehealth: Payer: Self-pay | Admitting: Physician Assistant

## 2014-12-31 ENCOUNTER — Other Ambulatory Visit: Payer: Self-pay | Admitting: Physician Assistant

## 2014-12-31 ENCOUNTER — Other Ambulatory Visit: Payer: Self-pay | Admitting: Internal Medicine

## 2014-12-31 DIAGNOSIS — I248 Other forms of acute ischemic heart disease: Secondary | ICD-10-CM

## 2014-12-31 DIAGNOSIS — E785 Hyperlipidemia, unspecified: Secondary | ICD-10-CM

## 2014-12-31 DIAGNOSIS — I5033 Acute on chronic diastolic (congestive) heart failure: Secondary | ICD-10-CM

## 2014-12-31 DIAGNOSIS — R0600 Dyspnea, unspecified: Secondary | ICD-10-CM

## 2014-12-31 DIAGNOSIS — I5043 Acute on chronic combined systolic (congestive) and diastolic (congestive) heart failure: Secondary | ICD-10-CM

## 2014-12-31 DIAGNOSIS — N183 Chronic kidney disease, stage 3 (moderate): Secondary | ICD-10-CM

## 2014-12-31 DIAGNOSIS — I2489 Other forms of acute ischemic heart disease: Secondary | ICD-10-CM

## 2014-12-31 DIAGNOSIS — R0602 Shortness of breath: Secondary | ICD-10-CM

## 2014-12-31 HISTORY — DX: Hyperlipidemia, unspecified: E78.5

## 2014-12-31 HISTORY — DX: Other forms of acute ischemic heart disease: I24.89

## 2014-12-31 HISTORY — DX: Acute on chronic combined systolic (congestive) and diastolic (congestive) heart failure: I50.43

## 2014-12-31 HISTORY — DX: Other forms of acute ischemic heart disease: I24.8

## 2014-12-31 LAB — GLUCOSE, CAPILLARY
GLUCOSE-CAPILLARY: 134 mg/dL — AB (ref 65–99)
Glucose-Capillary: 137 mg/dL — ABNORMAL HIGH (ref 65–99)

## 2014-12-31 LAB — BASIC METABOLIC PANEL
ANION GAP: 8 (ref 5–15)
BUN: 35 mg/dL — ABNORMAL HIGH (ref 6–20)
CO2: 24 mmol/L (ref 22–32)
Calcium: 9 mg/dL (ref 8.9–10.3)
Chloride: 106 mmol/L (ref 101–111)
Creatinine, Ser: 1.71 mg/dL — ABNORMAL HIGH (ref 0.61–1.24)
GFR calc Af Amer: 46 mL/min — ABNORMAL LOW (ref >60)
GFR, EST NON AFRICAN AMERICAN: 40 mL/min — AB (ref >60)
Glucose, Bld: 223 mg/dL — ABNORMAL HIGH (ref 65–99)
POTASSIUM: 4.7 mmol/L (ref 3.5–5.1)
SODIUM: 138 mmol/L (ref 135–145)

## 2014-12-31 MED ORDER — INSULIN DETEMIR 100 UNIT/ML ~~LOC~~ SOLN: 50.0000 [IU] | Freq: Two times a day (BID) | SUBCUTANEOUS | Status: DC

## 2014-12-31 MED ORDER — CARVEDILOL 6.25 MG PO TABS: 6.2500 mg | ORAL_TABLET | Freq: Two times a day (BID) | ORAL | Status: DC

## 2014-12-31 MED ORDER — MONTELUKAST SODIUM 10 MG PO TABS
10.0000 mg | ORAL_TABLET | Freq: Every day | ORAL | Status: DC
  Administered 2014-12-31: 10 mg via ORAL
  Filled 2014-12-31: qty 1

## 2014-12-31 MED ORDER — FUROSEMIDE 40 MG PO TABS: 40.0000 mg | ORAL_TABLET | Freq: Every day | ORAL | Status: DC

## 2014-12-31 MED ORDER — HYDRALAZINE HCL 25 MG PO TABS
25.0000 mg | ORAL_TABLET | Freq: Three times a day (TID) | ORAL | Status: DC
  Administered 2014-12-31: 25 mg via ORAL
  Filled 2014-12-31: qty 1

## 2014-12-31 MED ORDER — HYDRALAZINE HCL 25 MG PO TABS: 25.0000 mg | ORAL_TABLET | Freq: Three times a day (TID) | ORAL | Status: DC

## 2014-12-31 NOTE — Progress Notes (Signed)
PT Cancellation Note  Patient Details Name: Henry Carroll MRN: [id number] DOB: 04/05/1950   Cancelled Treatment:    Reason Eval/Treat Not Completed:  (Pt feels he is fine, declined eval).  Recheck tomorrow and may complete note if pt is not wanting eval and screen is good.   Henry Carroll 12/31/2014, 10:25 AM   Mee Hives, PT MS Acute Rehab Dept. Number: [facility] [id number] and [place] [phone]

## 2014-12-31 NOTE — Discharge Summary (Addendum)
Discharge Summary   Patient ID: COHEN NOVACEK MRN: [id number], DOB/AGE: [birth year] 64 y.o. Admit date: 12/29/2014 D/C date:     12/31/2014  Primary Cardiologist: [name]  Principal Problem:   Acute on chronic systolic and diastolic heart failure, NYHA class 1 (HCC)   Demand ischemia (HCC) Active Problems:   CKD stage 3, GFR 30-59 ml/min   Asthma, chronic   Anemia    DM type 2, uncontrolled   Obesity   Essential hypertension   Morbid obesity- BMI 30-39.9   HLD   Admission Dates: 12/29/14-12/31/14 Discharge Diagnosis: New onset combined systolic and diastolic CHF   HPI: JADE LANDSBERG is a 64 y.o. male with a history of HTN, HLD, DM2, CKD stage III, COPD, morbid obesity and long history of asthma presented with 3 days onset of DOE and found to have new onset CHF.  According to the patient, he was seen by cardiologist at [facility] 5 years ago and had a treadmill stress test which was negative. He also had a sleep study at the time which was also negative. He had not seen a cardiologist since. He reported being very active at home going swimming, playing golf every Monday, and working in the yard. He denied any recent onset of exertional chest discomfort or any history of chest pain at all. He did note recent onset of exertional dyspnea. He ran out of his chlorthalidone on Friday 12/24/2014. His DOE worsened 3 days since Sunday 12/26/2014. He noted he would become short of breath even going from the bathroom and back. He denies any chest discomfort, orthopnea or PND. He also noticed increasing lower extremity edema and abdominal girth. He finally decided to seek medical attention with his PCP in the morning of 12/29/2014. His PCPs office was concerned about acute onset of heart failure. EKG showed normal sinus rhythm with slight ST downsloping in V5 and V6. He was subsequently transferred to [facility] for further evaluation. Significant laboratory finding include  creatinine of 1.5, BNP 873, hemoglobin 10.5. Troponin was elevated at 0.53. Chest x-ray showed increased density of both costophrenic angles suspicious for small pleural effusion and adjacent atelectasis.    He was admitted for IV diuresis with excellent urine output. He was converted to oral lasix 12/31/14.  He was initially on IV heparin for elevated troponin which was later discontinued as his troponin levels remained low and flat ( 0.45--> 0.40--> 0.44) consistent with demand ischemia in the setting of acute CHF. 2D ECHO was performed which revealed EF 40-45%, HK of inf myocardium, G1DD, mod MR. He was placed on Coreg, which was increased to 6.25mg  BID on discharge, and continued on home benazepril. He was also sent home on Lasix 40mg  po qd. His creat was noted to be elevated and bumped with IV diuresis ( 1.4--> 1.7) with unclear baseline. >300g protein was noted on his UA. He was continued on his ACEi and outpatient nephrology consult was recommended. His BP was also noted to be elevated and hydralazine increased from BID to TID. Further titration of BP meds can be done as an outpatient if necessary.    Hospital Course:  New onset combined systolic and diastolic CHF  -- 123456: 2D ECHO: EF 40-45%, HK of inf myocardium, G1DD, mod MR -- Placed on IV Lasix with brisk diuresis. Net neg 4.7 L. Down 7lbs. Discharge weight 250 lbs. -- Discharged on 40mg  po qd lasix and home chlorthalidone discontinued.  BMET next week. -- Continue Coreg 6.25mg   BID and benazepril 40mg  po qd.  -- Daily weights, low sodium/fluid restriction were reviewed  Demand ischemia- troponin rise low and flat (0.45--> 0.40--> 0.44) consistent with demand ischemia in the setting of acute CHF. IV heparin discontinued.  -- Outpatient nuclear stress test arranged   CKD stage III- unclear baseline but appears to be around 1.4.-1.7.  -- >300g protein noted on UA and continued on ACEi (benazepril 40mg .) We have recommended he have a  consult with nephrology as an outpatient. I have sent a message to our referral department about getting him a referral to nephrology.  -- Creat 1.71 on discharge. Will arrange for a BMET in one week.   HTN- controlled on amlodipine 10mg , Coreg 6.25mg  BID, hydralazine 25mg  TID (increased from BID) and Benazepril 40mg  po qd.  HLD- continue statin   DM- continue home regimen. Levemir decreased to 50 BID from 70 due to some hypoglycemia.  Morbid obesity- Body mass index is 33.04 kg/(m^2). Weight loss was recommended   The patient has had an uncomplicated hospital course and is recovering well. He has been seen by [name] today and deemed ready for discharge home. All follow-up appointments have been scheduled.  Discharge medications are listed below.   Discharge Vitals: Blood pressure 143/85, pulse 86, temperature 98.4 F (36.9 C), temperature source Oral, resp. rate 16, height 6\' 1"  (1.854 m), weight 250 lb 6.4 oz (113.581 kg), SpO2 100 %.  Labs: Lab Results  Component Value Date   WBC 6.9 12/30/2014   HGB 10.0* 12/30/2014   HCT 29.7* 12/30/2014   MCV 91.1 12/30/2014   PLT 247 12/30/2014    Recent Labs Lab 12/29/14 1521  12/31/14 0338  NA 141  < > 138  K 4.7  < > 4.7  CL 110  < > 106  CO2 24  < > 24  BUN 31*  < > 35*  CREATININE 1.51*  < > 1.71*  CALCIUM 9.4  < > 9.0  PROT 6.1*  --   --   BILITOT 0.7  --   --   ALKPHOS 78  --   --   ALT 39  --   --   AST 39  --   --   GLUCOSE 103*  < > 223*  < > = values in this interval not displayed.  Recent Labs  12/29/14 2106 12/30/14 0229 12/30/14 0748  TROPONINI 0.45* 0.40* 0.44*   Lab Results  Component Value Date   CHOL 153 11/30/2014   HDL 41 11/30/2014   LDLCALC 83 11/30/2014   TRIG 145 11/30/2014   No results found for: DDIMER  Diagnostic Studies/Procedures   Dg Chest 2 View  12/29/2014  CLINICAL DATA:  Lower extremity edema and shortness of breath. History of COPD/ asthma. Initial encounter. EXAM: CHEST  2  VIEW COMPARISON:  01/18/2012 and 12/16/2011 radiographs. FINDINGS: The heart size and mediastinal contours are stable. The lungs are hyperinflated. There are increased densities at both costophrenic angles which probably reflect combination of pleural fluid and basilar atelectasis. No consolidation or definite edema seen. There is no pneumothorax. The bones appear unchanged with prominent paraspinal osteophytes. IMPRESSION: Increased density of both costophrenic angle suspicious for small pleural effusions and adjacent atelectasis. Electronically Signed   By: Richardean Sale M.D.   On: 12/29/2014 16:13     2D ECHO: 12/31/2014 LV EF: 40 - 45% Study Conclusions - Left ventricle: The cavity size was normal. Wall thickness was   normal. Systolic function was mildly  to moderately reduced. The   estimated ejection fraction was in the range of 40% to 45%. There   is hypokinesis of the inferolateral myocardium. Doppler   parameters are consistent with abnormal left ventricular   relaxation (grade 1 diastolic dysfunction). - Mitral valve: There was mild to moderate regurgitation.   Discharge Medications     Medication List    STOP taking these medications        chlorthalidone 25 MG tablet  Commonly known as:  HYGROTON      TAKE these medications        albuterol (2.5 MG/3ML) 0.083% nebulizer solution  Commonly known as:  PROVENTIL  Take 3 mLs (2.5 mg total) by nebulization every 6 (six) hours as needed for wheezing or shortness of breath.     PROAIR HFA 108 (90 BASE) MCG/ACT inhaler  Generic drug:  albuterol  INHALE TWO PUFFS BY MOUTH EVERY 6 HOURS AS NEEDED FOR SHORTNESS OF BREATH     amLODipine 10 MG tablet  Commonly known as:  NORVASC  Take 1 tablet (10 mg total) by mouth daily.     aspirin EC 81 MG tablet  Take 81 mg by mouth daily.     atorvastatin 20 MG tablet  Commonly known as:  LIPITOR  Take 1/2 tablet by mouth once daily for cholesterol     benazepril 40 MG tablet    Commonly known as:  LOTENSIN  TAKE ONE TABLET BY MOUTH ONCE DAILY FOR BLOOD PRESSURE     carvedilol 6.25 MG tablet  Commonly known as:  COREG  Take 1 tablet (6.25 mg total) by mouth 2 (two) times daily with a meal.     Cholecalciferol 1000 UNITS capsule  Commonly known as:  [employer] VITAMIN D3  Take 2 capsules (2,000 Units total) by mouth daily.     CO Q 10 PO  Take 1 tablet by mouth daily.     Fluticasone-Salmeterol 100-50 MCG/DOSE Aepb  Commonly known as:  ADVAIR DISKUS  INHALE ONE DOSE BY MOUTH TWICE DAILY     furosemide 40 MG tablet  Commonly known as:  LASIX  Take 1 tablet (40 mg total) by mouth daily.  Start taking on:  01/01/2015     hydrALAZINE 25 MG tablet  Commonly known as:  APRESOLINE  Take 1 tablet (25 mg total) by mouth 3 (three) times daily.     insulin aspart 100 UNIT/ML injection  Commonly known as:  novoLOG  Inject 8 Units into the skin 3 (three) times daily with meals. Inject 8 u insulin only for blood sugar greater than 180. Do not skip your meals     insulin detemir 100 UNIT/ML injection  Commonly known as:  LEVEMIR  Inject 0.5 mLs (50 Units total) into the skin 2 (two) times daily.     montelukast 10 MG tablet  Commonly known as:  SINGULAIR  TAKE ONE TABLET BY MOUTH ONCE DAILY AT BEDTIME     naproxen sodium 220 MG tablet  Commonly known as:  ANAPROX  Take 220 mg by mouth 2 (two) times daily as needed (pain, headache).     terbinafine 1 % cream  Commonly known as:  LAMISIL  Apply 1 application topically daily as needed (foot).     TRADJENTA 5 MG Tabs tablet  Generic drug:  linagliptin  TAKE ONE TABLET BY MOUTH ONCE DAILY        Disposition   The patient will be discharged in stable condition to home.  Follow-up  Information    Follow up with Eileen Stanford, PA-C On 01/17/2015.   Specialties:  Cardiology, Radiology   Why:  @ 9:00am    Contact information:   [place] [phone]        Follow up with [facility] On 01/13/2015.   Why:  @ 7:15am for your nuclear stress test. Nothing to eat or drink after midnight the night before.    Contact information:   [facility] [place] [phone]      Follow up with [facility] On 01/05/2015.   Why:  Please go in for labwork any time between 7:30am- 5:30pm    Contact information:   [place] [phone]        Duration of Discharge Encounter: Greater than 30 minutes including physician and PA time.  SignedAngelena Form R PA-C 12/31/2014, 3:12 PM   Personally seen and examined. Agree with above. Feels much better, decongestant. Ambulating without dyspnea.  ECHO EF 45% - inferior wall motion abnormality with MR. Check outpatient NUC Troponin flat mild elevation - Demand likely May need cath in future.   We will continue with Lasix but 40 mg by mouth once a day. Check basic metabolic profile in one week. Transition of care visit in 7-14 days.  With protein in urine, continue ACE inhibitor. Would likely benefit from renal consultation as outpatient.    Okay with discharge.  Candee Furbish, MD

## 2014-12-31 NOTE — Telephone Encounter (Signed)
     Patient needs referral to outpatient nephrology for CKD and possible nephrotic syndrome. Patient is being discharged from hospital today. I have sent a message to our referral department today. They will contact the patient  Angelena Form PA-C  [facility]

## 2014-12-31 NOTE — Progress Notes (Signed)
Patient: Henry Carroll / Admit Date: 12/29/2014 / Date of Encounter: 12/31/2014, 9:52 AM   Subjective: Feeling much better. Still notices some leg and hand edema but overall eager to go home when he can. He used to pay attention to low sodium in diet but has not done so recently. He also thinks he may drink excess fluid but was doing on purpose because of his DM.   Objective: Telemetry: NSR/occasional SB Physical Exam: Blood pressure 152/78, pulse 92, temperature 98.2 F (36.8 C), temperature source Oral, resp. rate 17, height 6\' 1"  (1.854 m), weight 250 lb 6.4 oz (113.581 kg), SpO2 96 %. Body mass index is 33.04 kg/(m^2). General: Well developed, well nourished obese AAM in no acute distress. Head: Normocephalic, atraumatic, sclera non-icteric, no xanthomas, nares are without discharge. Neck: Negative for carotid bruits. JVP not elevated. Lungs: Clear bilaterally to auscultation without wheezes, rales, or rhonchi. Breathing is unlabored. Heart: RRR S1 S2 without murmurs, rubs, or gallops.  Abdomen: Soft, non-tender, non-distended with normoactive bowel sounds. No rebound/guarding. Extremities: No clubbing or cyanosis. Trace BLE edema. Distal pedal pulses are 2+ and equal bilaterally. Neuro: Alert and oriented X 3. Moves all extremities spontaneously. Psych:  Responds to questions appropriately with a normal affect.   Intake/Output Summary (Last 24 hours) at 12/31/14 0952 Last data filed at 12/31/14 0805  Gross per 24 hour  Intake   1203 ml  Output   3380 ml  Net  -2177 ml    Inpatient Medications:  . amLODipine  10 mg Oral Daily  . aspirin EC  81 mg Oral Daily  . atorvastatin  10 mg Oral Q1200  . benazepril  40 mg Oral Daily  . carvedilol  3.125 mg Oral BID WC  . cholecalciferol  2,000 Units Oral Daily  . furosemide  40 mg Intravenous BID  . heparin  5,000 Units Subcutaneous 3 times per day  . hydrALAZINE  25 mg Oral BID  . insulin detemir  50 Units Subcutaneous BID    . mometasone-formoterol  2 puff Inhalation BID  . montelukast  10 mg Oral Q breakfast  . sodium chloride  3 mL Intravenous Q12H   Infusions:    Labs:  Recent Labs  12/30/14 0229 12/31/14 0338  NA 139 138  K 4.3 4.7  CL 108 106  CO2 24 24  GLUCOSE 130* 223*  BUN 31* 35*  CREATININE 1.47* 1.71*  CALCIUM 9.3 9.0    Recent Labs  12/29/14 1312 12/29/14 1521  AST 34 39  ALT 36 39  ALKPHOS 77 78  BILITOT 0.4 0.7  PROT 5.9* 6.1*  ALBUMIN 3.4* 3.2*    Recent Labs  12/29/14 1521 12/29/14 1530 12/30/14 0229  WBC 6.3  --  6.9  NEUTROABS 4.4  --   --   HGB 10.2* 10.5* 10.0*  HCT 29.5* 31.0* 29.7*  MCV 91.0  --  91.1  PLT 241  --  247    Recent Labs  12/29/14 2106 12/30/14 0229 12/30/14 0748  TROPONINI 0.45* 0.40* 0.44*   Invalid input(s): POCBNP No results for input(s): HGBA1C in the last 72 hours.   Radiology/Studies:  Dg Chest 2 View  12/29/2014  CLINICAL DATA:  Lower extremity edema and shortness of breath. History of COPD/ asthma. Initial encounter. EXAM: CHEST  2 VIEW COMPARISON:  01/18/2012 and 12/16/2011 radiographs. FINDINGS: The heart size and mediastinal contours are stable. The lungs are hyperinflated. There are increased densities at both costophrenic angles which probably reflect combination of  pleural fluid and basilar atelectasis. No consolidation or definite edema seen. There is no pneumothorax. The bones appear unchanged with prominent paraspinal osteophytes. IMPRESSION: Increased density of both costophrenic angle suspicious for small pleural effusions and adjacent atelectasis. Electronically Signed   By: Richardean Sale M.D.   On: 12/29/2014 16:13     Assessment and Plan  64 year old African-American male with PMH of HTN, HLD, DM2, CKD stage III, COPD and long history of asthma admitted with suspected new onset CHF, elevated troponin.  1. Acute CHF, type not yet known - weight down 7lb from admit and -4.3L. Still with mild volume on board. Got  his dose of IV Lasix this AM. Given rise in BUN/Cr will change to oral Lasix starting in AM. Reviewed daily weights, low sodium/fluid restriction - he watched the CHF video.  I have asked nurse to contact echo dept to see if they can come do echo this AM. See below re: hydralazine.  2. Elevated troponin - felt 2/2 demand ischemia in setting of CHF. Not ideal candidate for cath with renal insufficiency but would benefit from some ischemic workup regardless of EF given multiple CRFs. Will d/w MD. Continue ASA, BB, statin.  3. Essential HTN - UA does show >300 protein. Since he is on ACEI I am not sure how accurate a 24-hour urine protein collection will be. Should have f/u with renal to determine if any component of nephrotic syndrome. BP remains elevated at times - will increase hydralazine to TID and consider titrating dose further. Will not increase Coreg due to periodic bradycardia mostly nocturnal - would benefit from outpt sleep study.  4. HLD - continue statin.  5. CKD stage III - see above.  6. Anemia - ?of chronic disease. Stable.  Signed, Melina Copa PA-C Pager: [phone]   Personally seen and examined. Agree with above. Feels much better, decongestant. Ambulating without dyspnea. If he is unable to get echocardiogram prior to discharge I am fine with getting one as an outpatient.  We will continue with Lasix but 40 mg by mouth once a day. Check basic metabolic profile in one week. Transition of care visit in 7-14 days.  With protein in urine, continue ACE inhibitor. Would likely benefit from renal consultation as outpatient.  Okay with discharge.  Candee Furbish, MD

## 2014-12-31 NOTE — Progress Notes (Signed)
Echocardiogram 2D Echocardiogram has been performed.  Henry Carroll 12/31/2014, 1:10 PM

## 2014-12-31 NOTE — Discharge Summary (Signed)
Pt got discharged, discharge instructions provided and patient showed understanding to it, IV taken out,Telemonitor DC,pt left unit in wheelchair with all of the belongings accompanied with his wife.

## 2014-12-31 NOTE — Discharge Instructions (Signed)

## 2015-01-03 ENCOUNTER — Telehealth: Payer: Self-pay | Admitting: Cardiology

## 2015-01-03 NOTE — Telephone Encounter (Signed)
OK to use lasix 40mg  BID. BMET soon to recheck Henry Furbish, MD

## 2015-01-03 NOTE — Telephone Encounter (Signed)
New message     Pt states that he gained 4lbs over night.  Please advise

## 2015-01-03 NOTE — Telephone Encounter (Signed)
Pt aware to increase Lasix to 40 mg BID and recheck lab on Wednesday as scheduled.

## 2015-01-03 NOTE — Telephone Encounter (Signed)
Pt reporting 11/18 wt 246 lbs 11/19 wt 246 lbs 11/20 wt 247 lbs  11/21 wt 251 lbs  No increase in SOB, no cough, "maybe" alittle increase in edema - the the instep of his foot.  Yesterday he reports eating [place] sausage, scrambled egg, a [place] sandwich at lunch and chili at dinner.  He states he is keeping his NA intake to 2GM or less a day and trying to limit fluid intake to 1,500 cc or less a day. He is taking Furosemide 40 mg a day.  Last BUN/Crea was 35/1.7 - due for recheck 11/23. He is keeping his feet and legs elevated as much as possible. Advised I will review with a provider and call him back with orders.

## 2015-01-04 ENCOUNTER — Telehealth (HOSPITAL_COMMUNITY): Payer: Self-pay | Admitting: Surgery

## 2015-01-04 NOTE — Telephone Encounter (Signed)
Heart Failure Nurse Navigator Post Discharge Telephone Call  I called to check on Henry Carroll after his recent hospitalization.  He reports and I can see in documented notes in the chart that he had some weight gain over the past few days--however he reports that today his weight is down significantly 241 lbs versus 251lbs ? yesterday (written in note).  He does report feeling much better and denies any swelling or SOB.  He has been avoiding salt and sodium "as much as possible".  He also says that he now has all medications and was able to get the Chlorthalidone that he had not had prior to hospital admission.  He has a lab appt tomorrow and follow-up appt on 01/17/15 at [facility].  I have encouraged him to call me with any questions or concerns related to HF recommendations at home and commended him for calling [facility] related to weight increases earlier this week.

## 2015-01-05 ENCOUNTER — Other Ambulatory Visit (INDEPENDENT_AMBULATORY_CARE_PROVIDER_SITE_OTHER): Payer: Medicare Other | Admitting: *Deleted

## 2015-01-05 DIAGNOSIS — R0602 Shortness of breath: Secondary | ICD-10-CM | POA: Diagnosis not present

## 2015-01-05 LAB — BASIC METABOLIC PANEL
BUN: 49 mg/dL — ABNORMAL HIGH (ref 7–25)
CHLORIDE: 105 mmol/L (ref 98–110)
CO2: 24 mmol/L (ref 20–31)
CREATININE: 1.85 mg/dL — AB (ref 0.70–1.25)
Calcium: 8.9 mg/dL (ref 8.6–10.3)
GLUCOSE: 141 mg/dL — AB (ref 65–99)
Potassium: 4.9 mmol/L (ref 3.5–5.3)
Sodium: 138 mmol/L (ref 135–146)

## 2015-01-10 ENCOUNTER — Telehealth: Payer: Self-pay

## 2015-01-10 DIAGNOSIS — R7989 Other specified abnormal findings of blood chemistry: Secondary | ICD-10-CM

## 2015-01-10 DIAGNOSIS — R799 Abnormal finding of blood chemistry, unspecified: Secondary | ICD-10-CM

## 2015-01-10 NOTE — Telephone Encounter (Signed)
Called patient about lab results and will schedule for a BMET on 01/13/15. Patient verbalized understanding.

## 2015-01-11 ENCOUNTER — Telehealth (HOSPITAL_COMMUNITY): Payer: Self-pay | Admitting: *Deleted

## 2015-01-11 NOTE — Telephone Encounter (Signed)
Left message on voicemail per DPR in reference to upcoming appointment scheduled on 01/13/15 at 0715 with detailed instructions given per Myocardial Perfusion Study Information Sheet for the test. LM to arrive 15 minutes early, and that it is imperative to arrive on time for appointment to keep from having the test rescheduled. If you need to cancel or reschedule your appointment, please call the office within 24 hours of your appointment. Failure to do so may result in a cancellation of your appointment, and a $50 no show fee. Phone number given for call back for any questions. Jahson Emanuele, Ranae Palms

## 2015-01-13 ENCOUNTER — Ambulatory Visit (HOSPITAL_COMMUNITY): Payer: Medicare Other | Attending: Internal Medicine

## 2015-01-13 ENCOUNTER — Other Ambulatory Visit (INDEPENDENT_AMBULATORY_CARE_PROVIDER_SITE_OTHER): Payer: Medicare Other | Admitting: *Deleted

## 2015-01-13 DIAGNOSIS — R748 Abnormal levels of other serum enzymes: Secondary | ICD-10-CM | POA: Diagnosis not present

## 2015-01-13 DIAGNOSIS — I517 Cardiomegaly: Secondary | ICD-10-CM | POA: Insufficient documentation

## 2015-01-13 DIAGNOSIS — R0602 Shortness of breath: Secondary | ICD-10-CM | POA: Diagnosis not present

## 2015-01-13 DIAGNOSIS — R7989 Other specified abnormal findings of blood chemistry: Secondary | ICD-10-CM

## 2015-01-13 DIAGNOSIS — R0609 Other forms of dyspnea: Secondary | ICD-10-CM | POA: Diagnosis not present

## 2015-01-13 DIAGNOSIS — R799 Abnormal finding of blood chemistry, unspecified: Secondary | ICD-10-CM

## 2015-01-13 DIAGNOSIS — E119 Type 2 diabetes mellitus without complications: Secondary | ICD-10-CM | POA: Insufficient documentation

## 2015-01-13 DIAGNOSIS — R9439 Abnormal result of other cardiovascular function study: Secondary | ICD-10-CM | POA: Diagnosis not present

## 2015-01-13 DIAGNOSIS — I10 Essential (primary) hypertension: Secondary | ICD-10-CM | POA: Diagnosis not present

## 2015-01-13 LAB — MYOCARDIAL PERFUSION IMAGING
CHL CUP NUCLEAR SRS: 15
CHL CUP NUCLEAR SSS: 18
CHL CUP RESTING HR STRESS: 68 {beats}/min
LV dias vol: 186 mL
LV sys vol: 131 mL
NUC STRESS TID: 1.08
Peak HR: 82 {beats}/min
RATE: 0.29
SDS: 3

## 2015-01-13 LAB — BASIC METABOLIC PANEL
BUN: 54 mg/dL — AB (ref 7–25)
CALCIUM: 9.3 mg/dL (ref 8.6–10.3)
CO2: 25 mmol/L (ref 20–31)
CREATININE: 2.26 mg/dL — AB (ref 0.70–1.25)
Chloride: 104 mmol/L (ref 98–110)
Glucose, Bld: 155 mg/dL — ABNORMAL HIGH (ref 65–99)
Potassium: 4.7 mmol/L (ref 3.5–5.3)
Sodium: 138 mmol/L (ref 135–146)

## 2015-01-13 MED ORDER — REGADENOSON 0.4 MG/5ML IV SOLN
0.4000 mg | Freq: Once | INTRAVENOUS | Status: AC
  Administered 2015-01-13: 0.4 mg via INTRAVENOUS

## 2015-01-13 MED ORDER — TECHNETIUM TC 99M SESTAMIBI GENERIC - CARDIOLITE
32.5000 | Freq: Once | INTRAVENOUS | Status: AC | PRN
  Administered 2015-01-13: 33 via INTRAVENOUS

## 2015-01-13 MED ORDER — TECHNETIUM TC 99M SESTAMIBI GENERIC - CARDIOLITE
11.0000 | Freq: Once | INTRAVENOUS | Status: AC | PRN
  Administered 2015-01-13: 11 via INTRAVENOUS

## 2015-01-16 NOTE — Progress Notes (Signed)
Cardiology Office Note   Date:  01/17/2015   ID:  Henry Carroll, DOB 06/16/1950, MRN [id number]  PCP:  Gildardo Cranker, DO  Cardiologist:  [name]   CHF post hosp follow up and abnormal nuclear stress test.     History of Present Illness: Henry Carroll is a 64 y.o. male with a history of HTN, HLD, DM2, CKD stage III, COPD, morbid obesity and long history of asthma, recently diagnosed systolic CHF who presents to clinic for post hospital follow up.   Recently admitted to [facility] from 11/16-11/18/16 for newly diagnosed acute systolic CHF.     He was admitted for IV diuresis with excellent urine output. He was converted to oral lasix 12/31/14.  He was initially on IV heparin for elevated troponin which was later discontinued as his troponin levels remained low and flat ( 0.45--> 0.40--> 0.44) consistent with demand ischemia in the setting of acute CHF. 2D ECHO was performed which revealed EF 40-45%, HK of inf myocardium, G1DD, mod MR. He was placed on Coreg, which was increased to 6.25mg  BID on discharge, and continued on home benazepril. He was also sent home on Lasix 40mg  po qd. His creat was noted to be elevated and bumped with IV diuresis ( 1.4--> 1.7) with unclear baseline. >300g protein was noted on his UA. He was continued on his ACEi and outpatient nephrology consult was recommended. His BP was also noted to be elevated and hydralazine increased from BID to TID. Further titration of BP meds can be done as an outpatient if necessary.   After discharge he called the office for increased weight and his Lasix was increased from 40mg  po qd to BID. He had a repeat BMET that showed increase in BUN/Cr and it was decreased back down to 40 mg po qd. He had another BMET on 01/13/15 which revealed creat 2.26 BUN 54. He was told to hold his lasix and Benazepril. He underwent MPS on 01/13/15 which revealed a medium-sized, severe basal to mid inferior and inferolateral perfusion defect, which  was primarily fixed with some reversibility, suggesting primarily infarction with some peri-infarct ischemia. EF 30% with inferior and inferolateral severe hypokinesis.   Today he presents to clinic for post hospital follow up and to discuss abnormal nuclear stress test. No chest pain, LE edema, orthopnea or PND. He does have a little SOB with activity but not like it was before the hospital . He continues off Lasix and Benazepril. He is waiting to hear back from the nephrology office to set up an appointment.     Past Medical History  Diagnosis Date  . Diabetes mellitus   . Asthma   . Hypertension   . Anemia   . Anxiety   . Hyperlipidemia   . CKD (chronic kidney disease)   . Chronic combined systolic (congestive) and diastolic (congestive) heart failure ([place])     a. 12/31/14: 2D ECHO: EF 40-45%, HK of inf myocardium, G1DD, mod MR  . Morbid obesity ([place])     a. BMI 33    Past Surgical History  Procedure Laterality Date  . Tonsillectomy  1962  . Hernia repair  0000000    Umbilical  . Spine surgery  2006    L4 & L5  . Colon surgery  2011    Colonoscopy     Current Outpatient Prescriptions  Medication Sig Dispense Refill  . albuterol (PROVENTIL) (2.5 MG/3ML) 0.083% nebulizer solution Take 3 mLs (2.5 mg total) by nebulization every  6 (six) hours as needed for wheezing or shortness of breath. 3 mL 10  . amLODipine (NORVASC) 10 MG tablet Take 1 tablet (10 mg total) by mouth daily. 30 tablet 6  . aspirin EC 81 MG tablet Take 81 mg by mouth daily.    Marland Kitchen atorvastatin (LIPITOR) 20 MG tablet Take 10 mg by mouth daily.    . benazepril (LOTENSIN) 40 MG tablet Take 40 mg by mouth daily.    . carvedilol (COREG) 6.25 MG tablet Take 1 tablet (6.25 mg total) by mouth 2 (two) times daily with a meal. 60 tablet 6  . chlorthalidone (HYGROTON) 25 MG tablet TAKE TWO TABLETS BY MOUTH ONCE DAILY 180 tablet 0  . Cholecalciferol ([employer] VITAMIN D3) 1000 UNITS capsule Take 2 capsules (2,000 Units total) by  mouth daily. 60 capsule 3  . Coenzyme Q10 (CO Q 10 PO) Take 1 tablet by mouth daily.    . Fluticasone-Salmeterol (ADVAIR DISKUS) 100-50 MCG/DOSE AEPB Inhale 1 puff into the lungs 2 (two) times daily.    . furosemide (LASIX) 40 MG tablet Take 1 tablet (40 mg total) by mouth daily. 30 tablet 6  . hydrALAZINE (APRESOLINE) 25 MG tablet Take 1 tablet (25 mg total) by mouth 3 (three) times daily. 180 tablet 5  . insulin aspart (NOVOLOG) 100 UNIT/ML injection Inject 8 Units into the skin 3 (three) times daily with meals. Inject 8 u insulin only for blood sugar greater than 180. Do not skip your meals (Patient taking differently: Inject 8 Units into the skin 3 (three) times daily with meals as needed for high blood sugar. Inject 8 u insulin only for blood sugar greater than 180. Do not skip your meals) 1 vial 6  . insulin detemir (LEVEMIR) 100 UNIT/ML injection Inject 0.5 mLs (50 Units total) into the skin 2 (two) times daily. 30 mL 11  . linagliptin (TRADJENTA) 5 MG TABS tablet Take 5 mg by mouth daily.    . montelukast (SINGULAIR) 10 MG tablet Take 10 mg by mouth at bedtime.    . naproxen sodium (ANAPROX) 220 MG tablet Take 220 mg by mouth 2 (two) times daily as needed (pain, headache).    Marland Kitchen PROAIR HFA 108 (90 BASE) MCG/ACT inhaler INHALE TWO PUFFS BY MOUTH EVERY 6 HOURS AS NEEDED FOR SHORTNESS OF BREATH 27 each 3  . terbinafine (LAMISIL) 1 % cream Apply 1 application topically daily as needed (foot).      No current facility-administered medications for this visit.    Allergies:   Review of patient's allergies indicates no known allergies.    Social History:  The patient  reports that he quit smoking about 2 weeks ago. His smoking use included Cigars. He has never used smokeless tobacco. He reports that he drinks alcohol. He reports that he does not use illicit drugs.   Family History:  The patient's family history includes Diabetes in his mother; Heart disease in his father and mother; Hypertension  in his father; Kidney disease in his daughter; Stroke in his sister; Sudden death in his daughter. There is no history of Heart attack.    ROS:  Please see the history of present illness.   Otherwise, review of systems are positive for NONE.   All other systems are reviewed and negative.    PHYSICAL EXAM: VS:  BP 140/78 mmHg  Pulse 80  Ht 6\' 1"  (1.854 m)  Wt 243 lb (110.224 kg)  BMI 32.07 kg/m2 , BMI Body mass index is 32.07 kg/(m^2).  GEN: Well nourished, well developed, in no acute distress HEENT: normal Neck: no JVD, carotid bruits, or masses Cardiac: RRR; no murmurs, rubs, or gallops,no edema  Respiratory:  clear to auscultation bilaterally, normal work of breathing GI: soft, nontender, nondistended, + BS MS: no deformity or atrophy Skin: warm and dry, no rash Neuro:  Strength and sensation are intact Psych: euthymic mood, full affect   EKG:  EKG is not ordered today.    Recent Labs: 12/29/2014: ALT 39; B Natriuretic Peptide 873.8* 12/30/2014: Hemoglobin 10.0*; Platelets 247; TSH 1.603 01/13/2015: BUN 54*; Creat 2.26*; Potassium 4.7; Sodium 138    Lipid Panel    Component Value Date/Time   CHOL 153 11/30/2014 1022   TRIG 145 11/30/2014 1022   HDL 41 11/30/2014 1022   CHOLHDL 3.7 11/30/2014 1022   LDLCALC 83 11/30/2014 1022      Wt Readings from Last 3 Encounters:  01/17/15 243 lb (110.224 kg)  01/13/15 250 lb (113.399 kg)  12/31/14 250 lb 6.4 oz (113.581 kg)      Other studies Reviewed: Additional studies/ records that were reviewed today include: 2D ECHO, MPS. Review of the above records demonstrates: see HPI   ASSESSMENT AND PLAN:  Henry Carroll is a 64 y.o. male with a history of HTN, HLD, DM2, CKD stage III, COPD, morbid obesity and long history of asthma, recently diagnosed systolic CHF who presents to clinic for post hospital follow up.   New onset combined systolic and diastolic CHF   -- 123456: 2D ECHO: EF 40-45%, HK of inf myocardium,  G1DD, mod MR -- Discharge weight 250 lbs. Today weight 243 lbs. He appears euvolemic on exam.   -- Kidney function worsened and his lasix/ACE were stopped on 01/13/15. -- Continue Coreg 6.25mg  BID. Benazepril 40mg  held due to AKI. I will repeat a BMET today and resume lasix/ACE if creat stable. However, he may not be a candidate for ACE/ARB and we should consider hydralazine + nitrates as an alternative. -- Daily weights, low sodium/fluid restriction were reviewed  Abnormal nuclear stress test-  -- He had elevated troponin during admission for acute CHF felt to be c/w demand ischemia. Outpatient nuclear stress test arranged, which was done on 01/13/15 and reported a medium-sized, severe basal to mid inferior and inferolateral perfusion defect, which was primarily fixed with some reversibility, suggesting primarily infarction with some peri-infarct ischemia. EF 30% with inferior and inferolateral severe hypokinesis.  -- In the setting of new onset systolic CHF and abnormal MPS we will set him up for LHC; however we want to wait until his kidney function has normalized, so we will defer this until a later date. We will get a BMET today.  CKD stage III- unclear baseline but appears to be around 1.4.-1.7.  -- >300g protein noted on UA. We have recommended he have a consult with nephrology as an outpatient. Referral has been sent and he is awaiting call from nephrologist office.   -- Creat 1.71 on discharge. BMET on 01/13/15 showed creat 2.26 and ACE and lasix completely stopped. We will repeat a BMET today  HTN-  140/78 today. Continue on amlodipine 10mg , Coreg 6.25mg  BID, hydralazine 25mg  TID (increased from BID during recent admission).  HLD- continue statin   DM- continue home regimen.   Morbid obesity- Body mass index is 33.04 kg/(m^2). Weight loss was recommended    Current medicines are reviewed at length with the patient today.  The patient does not have concerns regarding medicines.  The  following changes have been made:  no change. He will continue to hold   Labs/ tests ordered today include:   Orders Placed This Encounter  Procedures  . Basic metabolic panel     Disposition:   FU with me on 02/03/15. We will set up [place] at that time if creat stable.   Renea Ee  01/17/2015 9:53 AM    [facility] [place], [place], [place]  [address] Phone: [phone]; Fax: [phone]

## 2015-01-17 ENCOUNTER — Ambulatory Visit (INDEPENDENT_AMBULATORY_CARE_PROVIDER_SITE_OTHER): Payer: Medicare Other | Admitting: Physician Assistant

## 2015-01-17 ENCOUNTER — Encounter: Payer: Self-pay | Admitting: Physician Assistant

## 2015-01-17 ENCOUNTER — Telehealth: Payer: Self-pay

## 2015-01-17 VITALS — BP 140/78 | HR 80 | Ht 73.0 in | Wt 243.0 lb

## 2015-01-17 DIAGNOSIS — R7989 Other specified abnormal findings of blood chemistry: Secondary | ICD-10-CM

## 2015-01-17 DIAGNOSIS — R799 Abnormal finding of blood chemistry, unspecified: Secondary | ICD-10-CM

## 2015-01-17 DIAGNOSIS — R748 Abnormal levels of other serum enzymes: Secondary | ICD-10-CM

## 2015-01-17 DIAGNOSIS — I158 Other secondary hypertension: Secondary | ICD-10-CM

## 2015-01-17 LAB — BASIC METABOLIC PANEL
BUN: 48 mg/dL — ABNORMAL HIGH (ref 7–25)
CHLORIDE: 105 mmol/L (ref 98–110)
CO2: 25 mmol/L (ref 20–31)
Calcium: 9.6 mg/dL (ref 8.6–10.3)
Creat: 1.82 mg/dL — ABNORMAL HIGH (ref 0.70–1.25)
GLUCOSE: 145 mg/dL — AB (ref 65–99)
POTASSIUM: 4.9 mmol/L (ref 3.5–5.3)
Sodium: 138 mmol/L (ref 135–146)

## 2015-01-17 MED ORDER — FUROSEMIDE 40 MG PO TABS: 20.0000 mg | ORAL_TABLET | Freq: Every day | ORAL | Status: DC

## 2015-01-17 NOTE — Patient Instructions (Signed)
Medication Instructions:  Your physician recommends that you continue on your current medications as directed. Please refer to the Current Medication list given to you today.  Our office will call you after your lab results with any medication changesf  Labwork: Your physician recommends that you return for lab work in: TODAY (BMET)   Testing/Procedures: none  Follow-Up: Your physician recommends that you schedule a follow-up appointment in: 2 WEEKS - 12/22 @ 9:30am.   Any Other Special Instructions Will Be Listed Below (If Applicable).     If you need a refill on your cardiac medications before your next appointment, please call your pharmacy.

## 2015-01-17 NOTE — Telephone Encounter (Signed)
-----   Message from Eileen Stanford, PA-C sent at 01/17/2015  4:40 PM EST ----- Will you let him know that his kidney function looks better. Restart taking lasix 20mg  po qd. And lets repeat a BMET in 1 week. If kidney function remains stable we will set up heart cath. Continue to hold Benazepril

## 2015-01-17 NOTE — Telephone Encounter (Signed)
Notes Recorded by Newt Minion, RN on 01/17/2015 at 5:12 PM Pt made aware of medication changes (start 20 mg Lasix QD and Hold Benazepril); pt schedule for repeat BMET on 12/12. Pt verbalized understanding no questions at this time.

## 2015-01-24 ENCOUNTER — Telehealth: Payer: Self-pay | Admitting: Cardiology

## 2015-01-24 ENCOUNTER — Other Ambulatory Visit (INDEPENDENT_AMBULATORY_CARE_PROVIDER_SITE_OTHER): Payer: Medicare Other | Admitting: *Deleted

## 2015-01-24 DIAGNOSIS — I158 Other secondary hypertension: Secondary | ICD-10-CM | POA: Diagnosis not present

## 2015-01-24 LAB — BASIC METABOLIC PANEL
BUN: 37 mg/dL — ABNORMAL HIGH (ref 7–25)
CALCIUM: 9.6 mg/dL (ref 8.6–10.3)
CO2: 28 mmol/L (ref 20–31)
Chloride: 104 mmol/L (ref 98–110)
Creat: 1.57 mg/dL — ABNORMAL HIGH (ref 0.70–1.25)
Glucose, Bld: 49 mg/dL — ABNORMAL LOW (ref 65–99)
POTASSIUM: 4.7 mmol/L (ref 3.5–5.3)
SODIUM: 139 mmol/L (ref 135–146)

## 2015-01-24 NOTE — Telephone Encounter (Signed)
New Message    Pt wants to inform, DR that he gained 3 pounds

## 2015-01-24 NOTE — Telephone Encounter (Signed)
Patient reports 3pd wt gain since yesterday.  Denies edema, SOB, or other symptoms. He is currently taking Lasix 20 mg daily with follow up BMET scheduled for today.  (Patient was taking 40 BID, was decreased secondary to elevated kidney function). Reviewed with Dr. Mare Ferrari (DOD), and will wait for patient's f/u BMET today before determining any possible med changes for patient. Informed patient that we would be in touch once labs have been completed and reviewed. He is agreeable to plan.

## 2015-01-25 ENCOUNTER — Telehealth: Payer: Self-pay | Admitting: Physician Assistant

## 2015-01-25 NOTE — Telephone Encounter (Signed)
     I spoke with patient this AM after receiving the results of his most recent BMET. He is feeling well and has been taking Lasix 20mg  poqd. No SOB, orthopnea, PND or LE edema. His kidney function looks good on this dose (~1.5). His new baseline weight will be 242 lbs moving forward. We will keep him on this same dosage of lasix. He will let us know if he gains more weight on top of this new baseline weight or has any of the above symptoms.     Angelena Form PA-C  [facility]

## 2015-01-25 NOTE — Telephone Encounter (Signed)
Henry Carroll spoke with pt this am according to documentation on labwork from 12/12.  Please see that note.

## 2015-01-27 DIAGNOSIS — N183 Chronic kidney disease, stage 3 (moderate): Secondary | ICD-10-CM | POA: Diagnosis not present

## 2015-01-27 DIAGNOSIS — I10 Essential (primary) hypertension: Secondary | ICD-10-CM | POA: Diagnosis not present

## 2015-01-27 DIAGNOSIS — E1129 Type 2 diabetes mellitus with other diabetic kidney complication: Secondary | ICD-10-CM | POA: Diagnosis not present

## 2015-01-27 DIAGNOSIS — R809 Proteinuria, unspecified: Secondary | ICD-10-CM | POA: Diagnosis not present

## 2015-01-28 ENCOUNTER — Other Ambulatory Visit (HOSPITAL_COMMUNITY): Payer: Self-pay | Admitting: Nephrology

## 2015-01-28 DIAGNOSIS — N183 Chronic kidney disease, stage 3 unspecified: Secondary | ICD-10-CM

## 2015-02-01 NOTE — Progress Notes (Signed)
Cardiology Office Note   Date:  02/03/2015   ID:  Henry Carroll, DOB [birth year], MRN [id number]  PCP:  Gildardo Cranker, DO  Cardiologist:  [name]   CHF follow up and abnormal nuclear stress test.     History of Present Illness: Henry Carroll is a 64 y.o. male with a history of HTN, HLD, DM2, CKD stage III, COPD, morbid obesity and long history of asthma, recently diagnosed systolic CHF who presents to clinic for follow up.   Recently admitted to [facility] from 11/16-11/18/16 for newly diagnosed acute systolic CHF.     He was admitted for IV diuresis with excellent urine output. He was converted to oral lasix 12/31/14.  He was initially on IV heparin for elevated troponin which was later discontinued as his troponin levels remained low and flat ( 0.45--> 0.40--> 0.44) consistent with demand ischemia in the setting of acute CHF. 2D ECHO was performed which revealed EF 40-45%, HK of inf myocardium, G1DD, mod MR. He was placed on Coreg, which was increased to 6.25mg  BID on discharge, and continued on home benazepril. He was also sent home on Lasix 40mg  po qd. His creat was noted to be elevated and bumped with IV diuresis ( 1.4--> 1.7) with unclear baseline. >300g protein was noted on his UA. He was continued on his ACEi and outpatient nephrology consult was recommended. His BP was also noted to be elevated and hydralazine increased from BID to TID.  After discharge he called the office for increased weight and his Lasix was increased from 40mg  po qd to BID. He had a repeat BMET that showed increase in BUN/Cr and it was decreased back down to 40 mg po qd. He had another BMET on 01/13/15 which revealed creat 2.26 BUN 54. He was told to hold his lasix and Benazepril. He underwent MPS on 01/13/15 which revealed a medium-sized, severe basal to mid inferior and inferolateral perfusion defect, which was primarily fixed with some reversibility, suggesting primarily infarction with some  peri-infarct ischemia. EF 30% with inferior and inferolateral severe hypokinesis.   I saw him on clinic 01/17/15 for hospital follow up and to discuss abnormal nuclear stress test. No chest pain, LE edema, orthopnea or PND. He did have a little SOB with activity but not like it was before the hospital . He continued off Lasix and Benazepril. He was waiting to hear back from the nephrology office to set up an appointment. We decided to hold of cath until his renal function normalized. He was resumed on his lasix 20mg  po qd. Creat 1.82--> 1.57 which is around his baseline. His new baseline weight is 242 lbs. He was seen by nephrology, Dr. Mercy Moore, last week who bumped his lasix up to 20mg  BID. He was okay with patient having heart cath.  Today he presents to clinic for follow up. He has been having some shortness of breath laying flat at first but then it gets better. No LE edema or PND. No SOB or chest pain with exertion. He blew leaves yesterday and had no symptoms. He has been weighing himself daily and his weight has been stable. Also following salt restrictions. He feels pretty good.    Past Medical History  Diagnosis Date  . Diabetes mellitus   . Asthma   . Hypertension   . Anemia   . Anxiety   . Hyperlipidemia   . CKD (chronic kidney disease)   . Chronic combined systolic (congestive) and diastolic (congestive) heart failure (  [place])     a. 12/31/14: 2D ECHO: EF 40-45%, HK of inf myocardium, G1DD, mod MR  . Morbid obesity ([place])     a. BMI 33    Past Surgical History  Procedure Laterality Date  . Tonsillectomy  1962  . Hernia repair  0000000    Umbilical  . Spine surgery  2006    L4 & L5  . Colon surgery  2011    Colonoscopy     Current Outpatient Prescriptions  Medication Sig Dispense Refill  . albuterol (PROVENTIL) (2.5 MG/3ML) 0.083% nebulizer solution Take 3 mLs (2.5 mg total) by nebulization every 6 (six) hours as needed for wheezing or shortness of breath. 3 mL 10  .  amLODipine (NORVASC) 10 MG tablet Take 1 tablet (10 mg total) by mouth daily. 30 tablet 6  . aspirin EC 81 MG tablet Take 81 mg by mouth daily.    Marland Kitchen atorvastatin (LIPITOR) 20 MG tablet Take 10 mg by mouth daily.    . carvedilol (COREG) 6.25 MG tablet Take 1 tablet (6.25 mg total) by mouth 2 (two) times daily with a meal. 60 tablet 6  . Cholecalciferol (CVS VITAMIN D3) 1000 UNITS capsule Take 2 capsules (2,000 Units total) by mouth daily. 60 capsule 3  . Coenzyme Q10 (CO Q 10 PO) Take 1 tablet by mouth daily.    . Fluticasone-Salmeterol (ADVAIR DISKUS) 100-50 MCG/DOSE AEPB Inhale 1 puff into the lungs 2 (two) times daily.    . furosemide (LASIX) 40 MG tablet Take 0.5 tablets (20 mg total) by mouth daily. 90 tablet 3  . hydrALAZINE (APRESOLINE) 25 MG tablet Take 1 tablet (25 mg total) by mouth 3 (three) times daily. 180 tablet 5  . insulin aspart (NOVOLOG) 100 UNIT/ML injection Inject 8 Units into the skin 3 (three) times daily with meals. Inject 8 u insulin only for blood sugar greater than 180. Do not skip your meals (Patient taking differently: Inject 8 Units into the skin 3 (three) times daily with meals as needed for high blood sugar. Inject 8 u insulin only for blood sugar greater than 180. Do not skip your meals) 1 vial 6  . insulin detemir (LEVEMIR) 100 UNIT/ML injection Inject 0.5 mLs (50 Units total) into the skin 2 (two) times daily. 30 mL 11  . linagliptin (TRADJENTA) 5 MG TABS tablet Take 5 mg by mouth daily.    . montelukast (SINGULAIR) 10 MG tablet Take 10 mg by mouth at bedtime.    Marland Kitchen PROAIR HFA 108 (90 BASE) MCG/ACT inhaler INHALE TWO PUFFS BY MOUTH EVERY 6 HOURS AS NEEDED FOR SHORTNESS OF BREATH 27 each 3  . terbinafine (LAMISIL) 1 % cream Apply 1 application topically daily as needed (foot).      No current facility-administered medications for this visit.    Allergies:   Review of patient's allergies indicates no known allergies.    Social History:  The patient  reports that he  quit smoking about 4 weeks ago. His smoking use included Cigars. He has never used smokeless tobacco. He reports that he drinks alcohol. He reports that he does not use illicit drugs.   Family History:  The patient's family history includes Diabetes in his mother; Heart disease in his father and mother; Hypertension in his father; Kidney disease in his daughter; Stroke in his sister; Sudden death in his daughter. There is no history of Heart attack.    ROS:  Please see the history of present illness.   Otherwise, review  of systems are positive for NONE.   All other systems are reviewed and negative.    PHYSICAL EXAM: VS:  BP 148/80 mmHg  Pulse 80  Ht 6\' 1"  (1.854 m)  Wt 248 lb (112.492 kg)  BMI 32.73 kg/m2  SpO2 99% , BMI Body mass index is 32.73 kg/(m^2). GEN: Well nourished, well developed, in no acute distress HEENT: normal Neck: no JVD, carotid bruits, or masses Cardiac: RRR; no murmurs, rubs, or gallops,no edema  Respiratory:  clear to auscultation bilaterally, normal work of breathing GI: soft, nontender, nondistended, + BS MS: no deformity or atrophy Skin: warm and dry, no rash Neuro:  Strength and sensation are intact Psych: euthymic mood, full affect   EKG:  EKG is not ordered today.    Recent Labs: 12/29/2014: ALT 39; B Natriuretic Peptide 873.8* 12/30/2014: Hemoglobin 10.0*; Platelets 247; TSH 1.603 01/24/2015: BUN 37*; Creat 1.57*; Potassium 4.7; Sodium 139    Lipid Panel    Component Value Date/Time   CHOL 153 11/30/2014 1022   TRIG 145 11/30/2014 1022   HDL 41 11/30/2014 1022   CHOLHDL 3.7 11/30/2014 1022   LDLCALC 83 11/30/2014 1022      Wt Readings from Last 3 Encounters:  02/03/15 248 lb (112.492 kg)  01/17/15 243 lb (110.224 kg)  01/13/15 250 lb (113.399 kg)      Other studies Reviewed: Additional studies/ records that were reviewed today include: 2D ECHO, MPS. Review of the above records demonstrates: see HPI   ASSESSMENT AND  PLAN:  Henry Carroll is a 64 y.o. male with a history of HTN, HLD, DM2, CKD stage III, COPD, morbid obesity and long history of asthma, recently diagnosed systolic CHF and abnormal nuclear stress test who presents to clinic for follow up.  New onset combined systolic and diastolic CHF:  -- 123456: 2D ECHO: EF 40-45%, HK of inf myocardium, G1DD, mod MR -- Dry weight 242 lbs. He appears euvolemic on exam.  Today in office 248 but on his own scale this AM he was 243. -- Continue Coreg 6.25mg  BID. I will discontinued his ACE due to CKD, we will start hydralazine + nitrates. -- Daily weights, low sodium/fluid restriction were reviewed  Abnormal nuclear stress test: -- He had elevated troponin during admission for acute CHF felt to be c/w demand ischemia. Outpatient nuclear stress test arranged, which was done on 01/13/15 and reported a medium-sized, severe basal to mid inferior and inferolateral perfusion defect, which was primarily fixed with some reversibility, suggesting primarily infarction with some peri-infarct ischemia. EF 30% with inferior and inferolateral severe hypokinesis.  -- In the setting of new onset systolic CHF and abnormal MPS we will set him up for LHC; however we wanted to wait until his kidney function had normalized. His creat is now close to baseline. We will set him up for [facility] +/- LV gram.  CKD stage III: unclear baseline but appears to be around 1.4.-1.7.  -- He is now followed by Dr. Mercy Moore. He recently and started on 20mg  Lasix BID. Clear for LHC by nephrology -- Creat 1.57 which appears to be around baseline. Will repeat labs today  HTN:  148/80 today. Continue on amlodipine 10mg , Coreg 6.25mg  BID, hydralazine 25mg  TID (increased from BID during recent admission). Will add imdur 30mg  as above.   HLD: continue statin   DM: continue home regimen. Will hold insulin the day of cath.   Morbid obesity: Body mass index is 33.04 kg/(m^2). Weight loss was recommended  Current medicines are reviewed at length with the patient today.  The patient does not have concerns regarding medicines.  The following changes have been made:  no change. Added imdur 30 mg po qd   Labs/ tests ordered today include:   No orders of the defined types were placed in this encounter.     Disposition:  Follow up with [name] after [facility]  Signed, Eileen Stanford, PA-C  02/03/2015 9:34 AM    [facility] [place], [place], [place]  [address] Phone: [phone]; Fax: [phone]

## 2015-02-02 ENCOUNTER — Ambulatory Visit (HOSPITAL_COMMUNITY)
Admission: RE | Admit: 2015-02-02 | Discharge: 2015-02-02 | Disposition: A | Discharge status: Home / Self Care | Payer: Medicare Other | Source: Ambulatory Visit | Attending: Nephrology | Admitting: Nephrology

## 2015-02-02 DIAGNOSIS — N183 Chronic kidney disease, stage 3 unspecified: Secondary | ICD-10-CM

## 2015-02-03 ENCOUNTER — Ambulatory Visit (INDEPENDENT_AMBULATORY_CARE_PROVIDER_SITE_OTHER): Payer: Medicare Other | Admitting: Physician Assistant

## 2015-02-03 ENCOUNTER — Encounter: Payer: Self-pay | Admitting: Physician Assistant

## 2015-02-03 VITALS — BP 148/80 | HR 80 | Ht 73.0 in | Wt 248.0 lb

## 2015-02-03 DIAGNOSIS — R9439 Abnormal result of other cardiovascular function study: Secondary | ICD-10-CM

## 2015-02-03 DIAGNOSIS — I25110 Atherosclerotic heart disease of native coronary artery with unstable angina pectoris: Secondary | ICD-10-CM | POA: Diagnosis not present

## 2015-02-03 LAB — CBC WITH DIFFERENTIAL/PLATELET
Basophils Absolute: 0 10*3/uL (ref 0.0–0.1)
Basophils Relative: 0 % (ref 0–1)
EOS ABS: 0.1 10*3/uL (ref 0.0–0.7)
EOS PCT: 3 % (ref 0–5)
HCT: 30.2 % — ABNORMAL LOW (ref 39.0–52.0)
Hemoglobin: 10.6 g/dL — ABNORMAL LOW (ref 13.0–17.0)
LYMPHS ABS: 1 10*3/uL (ref 0.7–4.0)
Lymphocytes Relative: 25 % (ref 12–46)
MCH: 31.1 pg (ref 26.0–34.0)
MCHC: 35.1 g/dL (ref 30.0–36.0)
MCV: 88.6 fL (ref 78.0–100.0)
MONO ABS: 0.5 10*3/uL (ref 0.1–1.0)
MONOS PCT: 11 % (ref 3–12)
MPV: 8.7 fL (ref 8.6–12.4)
NEUTROS PCT: 61 % (ref 43–77)
Neutro Abs: 2.5 10*3/uL (ref 1.7–7.7)
PLATELETS: 250 10*3/uL (ref 150–400)
RBC: 3.41 MIL/uL — ABNORMAL LOW (ref 4.22–5.81)
RDW: 13.3 % (ref 11.5–15.5)
WBC: 4.1 10*3/uL (ref 4.0–10.5)

## 2015-02-03 LAB — BASIC METABOLIC PANEL
BUN: 41 mg/dL — AB (ref 7–25)
CALCIUM: 9.1 mg/dL (ref 8.6–10.3)
CO2: 28 mmol/L (ref 20–31)
CREATININE: 1.79 mg/dL — AB (ref 0.70–1.25)
Chloride: 101 mmol/L (ref 98–110)
GLUCOSE: 196 mg/dL — AB (ref 65–99)
Potassium: 4.6 mmol/L (ref 3.5–5.3)
Sodium: 136 mmol/L (ref 135–146)

## 2015-02-03 LAB — PROTIME-INR
INR: 0.93 (ref <1.50)
PROTHROMBIN TIME: 12.6 s (ref 11.6–15.2)

## 2015-02-03 MED ORDER — ISOSORBIDE MONONITRATE ER 30 MG PO TB24: 30.0000 mg | ORAL_TABLET | Freq: Every day | ORAL | Status: DC

## 2015-02-03 NOTE — Patient Instructions (Addendum)
Medication Instructions:  Your physician has recommended you make the following change in your medication:  1.  START the Imdur 30 mg taking 1 tablet daily   Labwork: TODAY:  BMET                CBC W/DIFF                PT/INR  Testing/Procedures: Your physician has requested that you have a cardiac catheterization. Cardiac catheterization is used to diagnose and/or treat various heart conditions. Doctors may recommend this procedure for a number of different reasons. The most common reason is to evaluate chest pain. Chest pain can be a symptom of coronary artery disease (CAD), and cardiac catheterization can show whether plaque is narrowing or blocking your heart's arteries. This procedure is also used to evaluate the valves, as well as measure the blood flow and oxygen levels in different parts of your heart. For further information please visit [email or web address]. Please follow instruction sheet, as given.    Follow-Up: Your physician recommends that you schedule a follow-up appointment in: 2-3 MONTHS WITH [name]   Any Other Special Instructions Will Be Listed Below (If Applicable).  Coronary Angiogram   You are scheduled for Cardiac Cathetrization 02/09/15 @ [place] are to arrive that the [facility] entrance (where [employer] is) and get to registration by 9:30 a.m that morning.  We always tell our patients to take a bag just to be prepared in case you have to be kept over night.   A coronary angiogram, also called coronary angiography, is an X-ray procedure used to look at the arteries in the heart. In this procedure, a dye (contrast dye) is injected through a long, hollow tube (catheter). The catheter is about the size of a piece of cooked spaghetti and is inserted through your groin, wrist, or arm. The dye is injected into each artery, and X-rays are then taken to show if there is a blockage in the arteries of your heart. LET [facility] CARE PROVIDER KNOW  ABOUT:  Any allergies you have, including allergies to shellfish or contrast dye.   All medicines you are taking, including vitamins, herbs, eye drops, creams, and over-the-counter medicines.   Previous problems you or members of your family have had with the use of anesthetics.   Any blood disorders you have.   Previous surgeries you have had.  History of kidney problems or failure.   Other medical conditions you have. RISKS AND COMPLICATIONS  Generally, a coronary angiogram is a safe procedure. However, problems can occur and include:  Allergic reaction to the dye.  Bleeding from the access site or other locations.  Kidney injury, especially in people with impaired kidney function.  Stroke (rare).  Heart attack (rare). BEFORE THE PROCEDURE   Do not eat or drink anything after midnight the night before the procedure or as directed by your health care provider.   Ask your health care provider about changing or stopping your regular medicines. This is especially important if you are taking diabetes medicines or blood thinners.  DO NOT TAKE ANY INSULIN ON THE EVENING BEFORE YOUR PROCEDURE OR THE MORNING OF YOUR PROCEDURE.   PROCEDURE  You may be given a medicine to help you relax (sedative) before the procedure. This medicine is given through an intravenous (IV) access tube that is inserted into one of your veins.   The area where the catheter will be inserted will be washed and shaved.  This is usually done in the groin but may be done in the fold of your arm (near your elbow) or in the wrist.   A medicine will be given to numb the area where the catheter will be inserted (local anesthetic).   The health care provider will insert the catheter into an artery. The catheter will be guided by using a special type of X-ray (fluoroscopy) of the blood vessel being examined.   A special dye will then be injected into the catheter, and X-rays will be taken. The dye will  help to show where any narrowing or blockages are located in the heart arteries.  AFTER THE PROCEDURE   If the procedure is done through the leg, you will be kept in bed lying flat for several hours. You will be instructed to not bend or cross your legs.  The insertion site will be checked frequently.   The pulse in your feet or wrist will be checked frequently.   Additional blood tests, X-rays, and an electrocardiogram may be done.    This information is not intended to replace advice given to you by your health care provider. Make sure you discuss any questions you have with your health care provider.   Document Released: 08/05/2002 Document Revised: 02/19/2014 Document Reviewed: 06/23/2012 Elsevier Interactive Patient Education [employer].    If you need a refill on your cardiac medications before your next appointment, please call your pharmacy.

## 2015-02-09 ENCOUNTER — Ambulatory Visit (HOSPITAL_COMMUNITY)
Admission: RE | Admit: 2015-02-09 | Discharge: 2015-02-10 | Disposition: A | Discharge status: Home / Self Care | Payer: Medicare Other | Source: Ambulatory Visit | Attending: Cardiovascular Disease | Admitting: Cardiovascular Disease

## 2015-02-09 ENCOUNTER — Other Ambulatory Visit: Payer: Self-pay | Admitting: *Deleted

## 2015-02-09 ENCOUNTER — Encounter (HOSPITAL_COMMUNITY): Payer: Self-pay | Admitting: Cardiovascular Disease

## 2015-02-09 ENCOUNTER — Encounter (HOSPITAL_COMMUNITY)
Admission: RE | Disposition: A | Discharge status: Home / Self Care | Payer: Self-pay | Source: Ambulatory Visit | Attending: Cardiovascular Disease

## 2015-02-09 DIAGNOSIS — Z87891 Personal history of nicotine dependence: Secondary | ICD-10-CM | POA: Insufficient documentation

## 2015-02-09 DIAGNOSIS — I130 Hypertensive heart and chronic kidney disease with heart failure and stage 1 through stage 4 chronic kidney disease, or unspecified chronic kidney disease: Secondary | ICD-10-CM | POA: Diagnosis not present

## 2015-02-09 DIAGNOSIS — Z794 Long term (current) use of insulin: Secondary | ICD-10-CM | POA: Diagnosis not present

## 2015-02-09 DIAGNOSIS — Z7951 Long term (current) use of inhaled steroids: Secondary | ICD-10-CM | POA: Insufficient documentation

## 2015-02-09 DIAGNOSIS — I2582 Chronic total occlusion of coronary artery: Secondary | ICD-10-CM | POA: Insufficient documentation

## 2015-02-09 DIAGNOSIS — J449 Chronic obstructive pulmonary disease, unspecified: Secondary | ICD-10-CM | POA: Diagnosis not present

## 2015-02-09 DIAGNOSIS — E1121 Type 2 diabetes mellitus with diabetic nephropathy: Secondary | ICD-10-CM

## 2015-02-09 DIAGNOSIS — I5042 Chronic combined systolic (congestive) and diastolic (congestive) heart failure: Secondary | ICD-10-CM | POA: Diagnosis not present

## 2015-02-09 DIAGNOSIS — I10 Essential (primary) hypertension: Secondary | ICD-10-CM | POA: Diagnosis present

## 2015-02-09 DIAGNOSIS — I158 Other secondary hypertension: Secondary | ICD-10-CM

## 2015-02-09 DIAGNOSIS — Z6833 Body mass index (BMI) 33.0-33.9, adult: Secondary | ICD-10-CM | POA: Insufficient documentation

## 2015-02-09 DIAGNOSIS — D649 Anemia, unspecified: Secondary | ICD-10-CM | POA: Insufficient documentation

## 2015-02-09 DIAGNOSIS — N183 Chronic kidney disease, stage 3 unspecified: Secondary | ICD-10-CM | POA: Diagnosis present

## 2015-02-09 DIAGNOSIS — E785 Hyperlipidemia, unspecified: Secondary | ICD-10-CM | POA: Diagnosis not present

## 2015-02-09 DIAGNOSIS — E1122 Type 2 diabetes mellitus with diabetic chronic kidney disease: Secondary | ICD-10-CM | POA: Insufficient documentation

## 2015-02-09 DIAGNOSIS — Z8249 Family history of ischemic heart disease and other diseases of the circulatory system: Secondary | ICD-10-CM | POA: Insufficient documentation

## 2015-02-09 DIAGNOSIS — I340 Nonrheumatic mitral (valve) insufficiency: Secondary | ICD-10-CM

## 2015-02-09 DIAGNOSIS — F419 Anxiety disorder, unspecified: Secondary | ICD-10-CM | POA: Diagnosis not present

## 2015-02-09 DIAGNOSIS — I2510 Atherosclerotic heart disease of native coronary artery without angina pectoris: Secondary | ICD-10-CM

## 2015-02-09 DIAGNOSIS — E1165 Type 2 diabetes mellitus with hyperglycemia: Secondary | ICD-10-CM

## 2015-02-09 DIAGNOSIS — Z7982 Long term (current) use of aspirin: Secondary | ICD-10-CM | POA: Insufficient documentation

## 2015-02-09 DIAGNOSIS — I472 Ventricular tachycardia: Secondary | ICD-10-CM | POA: Diagnosis not present

## 2015-02-09 DIAGNOSIS — IMO0002 Reserved for concepts with insufficient information to code with codable children: Secondary | ICD-10-CM

## 2015-02-09 DIAGNOSIS — I429 Cardiomyopathy, unspecified: Secondary | ICD-10-CM | POA: Diagnosis not present

## 2015-02-09 DIAGNOSIS — J45909 Unspecified asthma, uncomplicated: Secondary | ICD-10-CM | POA: Diagnosis not present

## 2015-02-09 DIAGNOSIS — I25110 Atherosclerotic heart disease of native coronary artery with unstable angina pectoris: Secondary | ICD-10-CM | POA: Diagnosis not present

## 2015-02-09 DIAGNOSIS — R9439 Abnormal result of other cardiovascular function study: Secondary | ICD-10-CM

## 2015-02-09 DIAGNOSIS — I4729 Other ventricular tachycardia: Secondary | ICD-10-CM

## 2015-02-09 HISTORY — DX: Nonrheumatic mitral (valve) insufficiency: I34.0

## 2015-02-09 HISTORY — DX: Ventricular tachycardia: I47.2

## 2015-02-09 HISTORY — PX: CARDIAC CATHETERIZATION: SHX172

## 2015-02-09 HISTORY — DX: Other ventricular tachycardia: I47.29

## 2015-02-09 HISTORY — DX: Chronic kidney disease, stage 3 unspecified: N18.30

## 2015-02-09 HISTORY — DX: Atherosclerotic heart disease of native coronary artery without angina pectoris: I25.10

## 2015-02-09 HISTORY — DX: Obesity, unspecified: E66.9

## 2015-02-09 HISTORY — DX: Chronic kidney disease, stage 3 (moderate): N18.3

## 2015-02-09 LAB — BASIC METABOLIC PANEL
Anion gap: 5 (ref 5–15)
BUN: 35 mg/dL — AB (ref 6–20)
CHLORIDE: 107 mmol/L (ref 101–111)
CO2: 26 mmol/L (ref 22–32)
CREATININE: 1.8 mg/dL — AB (ref 0.61–1.24)
Calcium: 9.3 mg/dL (ref 8.9–10.3)
GFR, EST AFRICAN AMERICAN: 44 mL/min — AB (ref >60)
GFR, EST NON AFRICAN AMERICAN: 38 mL/min — AB (ref >60)
Glucose, Bld: 284 mg/dL — ABNORMAL HIGH (ref 65–99)
POTASSIUM: 5.3 mmol/L — AB (ref 3.5–5.1)
SODIUM: 138 mmol/L (ref 135–145)

## 2015-02-09 LAB — GLUCOSE, CAPILLARY
GLUCOSE-CAPILLARY: 255 mg/dL — AB (ref 65–99)
Glucose-Capillary: 236 mg/dL — ABNORMAL HIGH (ref 65–99)
Glucose-Capillary: 235 mg/dL — ABNORMAL HIGH (ref 65–99)

## 2015-02-09 SURGERY — LEFT HEART CATH AND CORONARY ANGIOGRAPHY

## 2015-02-09 MED ORDER — ACETAMINOPHEN 325 MG PO TABS: 650.0000 mg | ORAL_TABLET | ORAL | Status: DC | PRN

## 2015-02-09 MED ORDER — SODIUM CHLORIDE 0.9 % IV SOLN: 250.0000 mL | INTRAVENOUS | Status: DC | PRN

## 2015-02-09 MED ORDER — INSULIN ASPART 100 UNIT/ML ~~LOC~~ SOLN: 10.0000 [IU] | Freq: Three times a day (TID) | SUBCUTANEOUS | Status: DC | PRN

## 2015-02-09 MED ORDER — ASPIRIN 81 MG PO CHEW: 81.0000 mg | CHEWABLE_TABLET | ORAL | Status: DC

## 2015-02-09 MED ORDER — HYDRALAZINE HCL 25 MG PO TABS
25.0000 mg | ORAL_TABLET | Freq: Three times a day (TID) | ORAL | Status: DC
  Administered 2015-02-09 – 2015-02-10 (×3): 25 mg via ORAL
  Filled 2015-02-09 (×4): qty 1

## 2015-02-09 MED ORDER — MOMETASONE FURO-FORMOTEROL FUM 100-5 MCG/ACT IN AERO
2.0000 | INHALATION_SPRAY | Freq: Two times a day (BID) | RESPIRATORY_TRACT | Status: DC
  Administered 2015-02-09 – 2015-02-10 (×2): 2 via RESPIRATORY_TRACT
  Filled 2015-02-09: qty 8.8

## 2015-02-09 MED ORDER — MIDAZOLAM HCL 2 MG/2ML IJ SOLN
INTRAMUSCULAR | Status: AC
  Filled 2015-02-09: qty 2

## 2015-02-09 MED ORDER — HEPARIN (PORCINE) IN NACL 2-0.9 UNIT/ML-% IJ SOLN
INTRAMUSCULAR | Status: AC
  Filled 2015-02-09: qty 2000

## 2015-02-09 MED ORDER — FUROSEMIDE 20 MG PO TABS
20.0000 mg | ORAL_TABLET | Freq: Two times a day (BID) | ORAL | Status: DC
  Administered 2015-02-09 – 2015-02-10 (×2): 20 mg via ORAL
  Filled 2015-02-09 (×2): qty 1

## 2015-02-09 MED ORDER — HEPARIN (PORCINE) IN NACL 2-0.9 UNIT/ML-% IJ SOLN
INTRAMUSCULAR | Status: DC | PRN
  Administered 2015-02-09: 10 mL via INTRA_ARTERIAL

## 2015-02-09 MED ORDER — HEPARIN SODIUM (PORCINE) 1000 UNIT/ML IJ SOLN
INTRAMUSCULAR | Status: AC
  Filled 2015-02-09: qty 1

## 2015-02-09 MED ORDER — FENTANYL CITRATE (PF) 100 MCG/2ML IJ SOLN
INTRAMUSCULAR | Status: DC | PRN
  Administered 2015-02-09: 25 ug via INTRAVENOUS

## 2015-02-09 MED ORDER — LINAGLIPTIN 5 MG PO TABS
5.0000 mg | ORAL_TABLET | Freq: Every day | ORAL | Status: DC
  Administered 2015-02-09 – 2015-02-10 (×2): 5 mg via ORAL
  Filled 2015-02-09 (×2): qty 1

## 2015-02-09 MED ORDER — FENTANYL CITRATE (PF) 100 MCG/2ML IJ SOLN
INTRAMUSCULAR | Status: AC
  Filled 2015-02-09: qty 2

## 2015-02-09 MED ORDER — HEPARIN SODIUM (PORCINE) 1000 UNIT/ML IJ SOLN
INTRAMUSCULAR | Status: DC | PRN
  Administered 2015-02-09: 5000 [IU] via INTRAVENOUS

## 2015-02-09 MED ORDER — VERAPAMIL HCL 2.5 MG/ML IV SOLN
INTRAVENOUS | Status: AC
Start: 2015-02-09 — End: 2015-02-09
  Filled 2015-02-09: qty 2

## 2015-02-09 MED ORDER — ISOSORBIDE MONONITRATE ER 30 MG PO TB24
30.0000 mg | ORAL_TABLET | Freq: Every day | ORAL | Status: DC
  Filled 2015-02-09 (×2): qty 1

## 2015-02-09 MED ORDER — ONDANSETRON HCL 4 MG/2ML IJ SOLN: 4.0000 mg | Freq: Four times a day (QID) | INTRAMUSCULAR | Status: DC | PRN

## 2015-02-09 MED ORDER — SODIUM CHLORIDE 0.9 % IJ SOLN: 3.0000 mL | INTRAMUSCULAR | Status: DC | PRN

## 2015-02-09 MED ORDER — SODIUM CHLORIDE 0.9 % IJ SOLN: 3.0000 mL | Freq: Two times a day (BID) | INTRAMUSCULAR | Status: DC

## 2015-02-09 MED ORDER — MONTELUKAST SODIUM 10 MG PO TABS
10.0000 mg | ORAL_TABLET | Freq: Every day | ORAL | Status: DC
  Administered 2015-02-09: 10 mg via ORAL
  Filled 2015-02-09 (×2): qty 1

## 2015-02-09 MED ORDER — SODIUM CHLORIDE 0.9 % WEIGHT BASED INFUSION
3.0000 mL/kg/h | INTRAVENOUS | Status: DC
  Administered 2015-02-09: 3 mL/kg/h via INTRAVENOUS

## 2015-02-09 MED ORDER — INSULIN DETEMIR 100 UNIT/ML ~~LOC~~ SOLN
50.0000 [IU] | Freq: Two times a day (BID) | SUBCUTANEOUS | Status: DC
  Administered 2015-02-09 – 2015-02-10 (×2): 50 [IU] via SUBCUTANEOUS
  Filled 2015-02-09 (×5): qty 0.5

## 2015-02-09 MED ORDER — AMLODIPINE BESYLATE 10 MG PO TABS
10.0000 mg | ORAL_TABLET | Freq: Every day | ORAL | Status: DC
  Administered 2015-02-10: 10 mg via ORAL
  Filled 2015-02-09 (×2): qty 1

## 2015-02-09 MED ORDER — HEPARIN (PORCINE) IN NACL 2-0.9 UNIT/ML-% IJ SOLN
INTRAMUSCULAR | Status: DC | PRN
  Administered 2015-02-09: 14:00:00

## 2015-02-09 MED ORDER — SODIUM CHLORIDE 0.9 % IV SOLN
INTRAVENOUS | Status: AC
  Administered 2015-02-09: 20:00:00 via INTRAVENOUS

## 2015-02-09 MED ORDER — VITAMIN D 1000 UNITS PO TABS
2000.0000 [IU] | ORAL_TABLET | Freq: Every day | ORAL | Status: DC
  Administered 2015-02-10: 2000 [IU] via ORAL
  Filled 2015-02-09 (×2): qty 2

## 2015-02-09 MED ORDER — SODIUM CHLORIDE 0.9 % WEIGHT BASED INFUSION: 1.0000 mL/kg/h | INTRAVENOUS | Status: DC

## 2015-02-09 MED ORDER — MIDAZOLAM HCL 2 MG/2ML IJ SOLN
INTRAMUSCULAR | Status: DC | PRN
  Administered 2015-02-09: 1 mg via INTRAVENOUS

## 2015-02-09 MED ORDER — CARVEDILOL 6.25 MG PO TABS
6.2500 mg | ORAL_TABLET | Freq: Two times a day (BID) | ORAL | Status: DC
  Administered 2015-02-09: 6.25 mg via ORAL
  Filled 2015-02-09: qty 1

## 2015-02-09 MED ORDER — ALBUTEROL SULFATE (2.5 MG/3ML) 0.083% IN NEBU
2.5000 mg | INHALATION_SOLUTION | Freq: Four times a day (QID) | RESPIRATORY_TRACT | Status: DC | PRN
  Filled 2015-02-09: qty 3

## 2015-02-09 MED ORDER — ASPIRIN EC 81 MG PO TBEC
81.0000 mg | DELAYED_RELEASE_TABLET | Freq: Every day | ORAL | Status: DC
  Administered 2015-02-10: 81 mg via ORAL
  Filled 2015-02-09 (×2): qty 1

## 2015-02-09 MED ORDER — IOHEXOL 350 MG/ML SOLN
INTRAVENOUS | Status: DC | PRN
  Administered 2015-02-09: 65 mL via INTRACARDIAC

## 2015-02-09 MED ORDER — ALBUTEROL SULFATE (2.5 MG/3ML) 0.083% IN NEBU: 2.5000 mg | INHALATION_SOLUTION | Freq: Four times a day (QID) | RESPIRATORY_TRACT | Status: DC | PRN

## 2015-02-09 MED ORDER — ATORVASTATIN CALCIUM 10 MG PO TABS
10.0000 mg | ORAL_TABLET | Freq: Every day | ORAL | Status: DC
  Filled 2015-02-09: qty 1

## 2015-02-09 MED ORDER — LIDOCAINE HCL (PF) 1 % IJ SOLN
INTRAMUSCULAR | Status: AC
  Filled 2015-02-09: qty 30

## 2015-02-09 SURGICAL SUPPLY — 12 items
CATH INFINITI 5 FR JL3.5 (CATHETERS) ×3 IMPLANT
CATH INFINITI 5FR ANG PIGTAIL (CATHETERS) ×3 IMPLANT
CATH INFINITI JR4 5F (CATHETERS) ×3 IMPLANT
CATH LAUNCHER 5F EBU3.5 (CATHETERS) ×2 IMPLANT
DEVICE RAD COMP TR BAND LRG (VASCULAR PRODUCTS) ×3 IMPLANT
GLIDESHEATH SLEND SS 6F .021 (SHEATH) ×3 IMPLANT
KIT HEART LEFT (KITS) ×3 IMPLANT
PACK CARDIAC CATHETERIZATION (CUSTOM PROCEDURE TRAY) ×3 IMPLANT
SYR MEDRAD MARK V 150ML (SYRINGE) ×3 IMPLANT
TRANSDUCER W/STOPCOCK (MISCELLANEOUS) ×3 IMPLANT
TUBING CIL FLEX 10 FLL-RA (TUBING) ×3 IMPLANT
WIRE SAFE-T 1.5MM-J .035X260CM (WIRE) ×3 IMPLANT

## 2015-02-09 NOTE — H&P (View-Only) (Signed)
Cardiology Office Note   Date:  02/03/2015   ID:  Henry Carroll, DOB 17-Jun-1950, MRN [id number]  PCP:  Gildardo Cranker, DO  Cardiologist:  [name]   CHF follow up and abnormal nuclear stress test.     History of Present Illness: Henry Carroll is a 64 y.o. male with a history of HTN, HLD, DM2, CKD stage III, COPD, morbid obesity and long history of asthma, recently diagnosed systolic CHF who presents to clinic for follow up.   Recently admitted to [facility] from 11/16-11/18/16 for newly diagnosed acute systolic CHF.     He was admitted for IV diuresis with excellent urine output. He was converted to oral lasix 12/31/14.  He was initially on IV heparin for elevated troponin which was later discontinued as his troponin levels remained low and flat ( 0.45--> 0.40--> 0.44) consistent with demand ischemia in the setting of acute CHF. 2D ECHO was performed which revealed EF 40-45%, HK of inf myocardium, G1DD, mod MR. He was placed on Coreg, which was increased to 6.25mg  BID on discharge, and continued on home benazepril. He was also sent home on Lasix 40mg  po qd. His creat was noted to be elevated and bumped with IV diuresis ( 1.4--> 1.7) with unclear baseline. >300g protein was noted on his UA. He was continued on his ACEi and outpatient nephrology consult was recommended. His BP was also noted to be elevated and hydralazine increased from BID to TID.  After discharge he called the office for increased weight and his Lasix was increased from 40mg  po qd to BID. He had a repeat BMET that showed increase in BUN/Cr and it was decreased back down to 40 mg po qd. He had another BMET on 01/13/15 which revealed creat 2.26 BUN 54. He was told to hold his lasix and Benazepril. He underwent MPS on 01/13/15 which revealed a medium-sized, severe basal to mid inferior and inferolateral perfusion defect, which was primarily fixed with some reversibility, suggesting primarily infarction with some  peri-infarct ischemia. EF 30% with inferior and inferolateral severe hypokinesis.   I saw him on clinic 01/17/15 for hospital follow up and to discuss abnormal nuclear stress test. No chest pain, LE edema, orthopnea or PND. He did have a little SOB with activity but not like it was before the hospital . He continued off Lasix and Benazepril. He was waiting to hear back from the nephrology office to set up an appointment. We decided to hold of cath until his renal function normalized. He was resumed on his lasix 20mg  po qd. Creat 1.82--> 1.57 which is around his baseline. His new baseline weight is 242 lbs. He was seen by nephrology, Dr. Mercy Moore, last week who bumped his lasix up to 20mg  BID. He was okay with patient having heart cath.  Today he presents to clinic for follow up. He has been having some shortness of breath laying flat at first but then it gets better. No LE edema or PND. No SOB or chest pain with exertion. He blew leaves yesterday and had no symptoms. He has been weighing himself daily and his weight has been stable. Also following salt restrictions. He feels pretty good.    Past Medical History  Diagnosis Date  . Diabetes mellitus   . Asthma   . Hypertension   . Anemia   . Anxiety   . Hyperlipidemia   . CKD (chronic kidney disease)   . Chronic combined systolic (congestive) and diastolic (congestive) heart failure (  [place])     a. 12/31/14: 2D ECHO: EF 40-45%, HK of inf myocardium, G1DD, mod MR  . Morbid obesity ([place])     a. BMI 33    Past Surgical History  Procedure Laterality Date  . Tonsillectomy  1962  . Hernia repair  0000000    Umbilical  . Spine surgery  2006    L4 & L5  . Colon surgery  2011    Colonoscopy     Current Outpatient Prescriptions  Medication Sig Dispense Refill  . albuterol (PROVENTIL) (2.5 MG/3ML) 0.083% nebulizer solution Take 3 mLs (2.5 mg total) by nebulization every 6 (six) hours as needed for wheezing or shortness of breath. 3 mL 10  .  amLODipine (NORVASC) 10 MG tablet Take 1 tablet (10 mg total) by mouth daily. 30 tablet 6  . aspirin EC 81 MG tablet Take 81 mg by mouth daily.    Marland Kitchen atorvastatin (LIPITOR) 20 MG tablet Take 10 mg by mouth daily.    . carvedilol (COREG) 6.25 MG tablet Take 1 tablet (6.25 mg total) by mouth 2 (two) times daily with a meal. 60 tablet 6  . Cholecalciferol ([employer] VITAMIN D3) 1000 UNITS capsule Take 2 capsules (2,000 Units total) by mouth daily. 60 capsule 3  . Coenzyme Q10 (CO Q 10 PO) Take 1 tablet by mouth daily.    . Fluticasone-Salmeterol (ADVAIR DISKUS) 100-50 MCG/DOSE AEPB Inhale 1 puff into the lungs 2 (two) times daily.    . furosemide (LASIX) 40 MG tablet Take 0.5 tablets (20 mg total) by mouth daily. 90 tablet 3  . hydrALAZINE (APRESOLINE) 25 MG tablet Take 1 tablet (25 mg total) by mouth 3 (three) times daily. 180 tablet 5  . insulin aspart (NOVOLOG) 100 UNIT/ML injection Inject 8 Units into the skin 3 (three) times daily with meals. Inject 8 u insulin only for blood sugar greater than 180. Do not skip your meals (Patient taking differently: Inject 8 Units into the skin 3 (three) times daily with meals as needed for high blood sugar. Inject 8 u insulin only for blood sugar greater than 180. Do not skip your meals) 1 vial 6  . insulin detemir (LEVEMIR) 100 UNIT/ML injection Inject 0.5 mLs (50 Units total) into the skin 2 (two) times daily. 30 mL 11  . linagliptin (TRADJENTA) 5 MG TABS tablet Take 5 mg by mouth daily.    . montelukast (SINGULAIR) 10 MG tablet Take 10 mg by mouth at bedtime.    Marland Kitchen PROAIR HFA 108 (90 BASE) MCG/ACT inhaler INHALE TWO PUFFS BY MOUTH EVERY 6 HOURS AS NEEDED FOR SHORTNESS OF BREATH 27 each 3  . terbinafine (LAMISIL) 1 % cream Apply 1 application topically daily as needed (foot).      No current facility-administered medications for this visit.    Allergies:   Review of patient's allergies indicates no known allergies.    Social History:  The patient  reports that he  quit smoking about 4 weeks ago. His smoking use included Cigars. He has never used smokeless tobacco. He reports that he drinks alcohol. He reports that he does not use illicit drugs.   Family History:  The patient's family history includes Diabetes in his mother; Heart disease in his father and mother; Hypertension in his father; Kidney disease in his daughter; Stroke in his sister; Sudden death in his daughter. There is no history of Heart attack.    ROS:  Please see the history of present illness.   Otherwise, review  of systems are positive for NONE.   All other systems are reviewed and negative.    PHYSICAL EXAM: VS:  BP 148/80 mmHg  Pulse 80  Ht 6\' 1"  (1.854 m)  Wt 248 lb (112.492 kg)  BMI 32.73 kg/m2  SpO2 99% , BMI Body mass index is 32.73 kg/(m^2). GEN: Well nourished, well developed, in no acute distress HEENT: normal Neck: no JVD, carotid bruits, or masses Cardiac: RRR; no murmurs, rubs, or gallops,no edema  Respiratory:  clear to auscultation bilaterally, normal work of breathing GI: soft, nontender, nondistended, + BS MS: no deformity or atrophy Skin: warm and dry, no rash Neuro:  Strength and sensation are intact Psych: euthymic mood, full affect   EKG:  EKG is not ordered today.    Recent Labs: 12/29/2014: ALT 39; B Natriuretic Peptide 873.8* 12/30/2014: Hemoglobin 10.0*; Platelets 247; TSH 1.603 01/24/2015: BUN 37*; Creat 1.57*; Potassium 4.7; Sodium 139    Lipid Panel    Component Value Date/Time   CHOL 153 11/30/2014 1022   TRIG 145 11/30/2014 1022   HDL 41 11/30/2014 1022   CHOLHDL 3.7 11/30/2014 1022   LDLCALC 83 11/30/2014 1022      Wt Readings from Last 3 Encounters:  02/03/15 248 lb (112.492 kg)  01/17/15 243 lb (110.224 kg)  01/13/15 250 lb (113.399 kg)      Other studies Reviewed: Additional studies/ records that were reviewed today include: 2D ECHO, MPS. Review of the above records demonstrates: see HPI   ASSESSMENT AND  PLAN:  Henry Carroll is a 64 y.o. male with a history of HTN, HLD, DM2, CKD stage III, COPD, morbid obesity and long history of asthma, recently diagnosed systolic CHF and abnormal nuclear stress test who presents to clinic for follow up.  New onset combined systolic and diastolic CHF:  -- 123456: 2D ECHO: EF 40-45%, HK of inf myocardium, G1DD, mod MR -- Dry weight 242 lbs. He appears euvolemic on exam.  Today in office 248 but on his own scale this AM he was 243. -- Continue Coreg 6.25mg  BID. I will discontinued his ACE due to CKD, we will start hydralazine + nitrates. -- Daily weights, low sodium/fluid restriction were reviewed  Abnormal nuclear stress test: -- He had elevated troponin during admission for acute CHF felt to be c/w demand ischemia. Outpatient nuclear stress test arranged, which was done on 01/13/15 and reported a medium-sized, severe basal to mid inferior and inferolateral perfusion defect, which was primarily fixed with some reversibility, suggesting primarily infarction with some peri-infarct ischemia. EF 30% with inferior and inferolateral severe hypokinesis.  -- In the setting of new onset systolic CHF and abnormal MPS we will set him up for LHC; however we wanted to wait until his kidney function had normalized. His creat is now close to baseline. We will set him up for [facility] +/- LV gram.  CKD stage III: unclear baseline but appears to be around 1.4.-1.7.  -- He is now followed by Dr. Mercy Moore. He recently and started on 20mg  Lasix BID. Clear for LHC by nephrology -- Creat 1.57 which appears to be around baseline. Will repeat labs today  HTN:  148/80 today. Continue on amlodipine 10mg , Coreg 6.25mg  BID, hydralazine 25mg  TID (increased from BID during recent admission). Will add imdur 30mg  as above.   HLD: continue statin   DM: continue home regimen. Will hold insulin the day of cath.   Morbid obesity: Body mass index is 33.04 kg/(m^2). Weight loss was recommended  Current medicines are reviewed at length with the patient today.  The patient does not have concerns regarding medicines.  The following changes have been made:  no change. Added imdur 30 mg po qd   Labs/ tests ordered today include:   No orders of the defined types were placed in this encounter.     Disposition:  Follow up with [name] after [facility]  Signed, Eileen Stanford, PA-C  02/03/2015 9:34 AM    [address], [place], [place]  [address] Phone: [phone]; Fax: [phone]

## 2015-02-09 NOTE — Interval H&P Note (Signed)
History and Physical Interval Note:  02/09/2015 11:12 AM  Henry Carroll  has presented today for cardiac cath with the diagnosis of cardiomyopathy, abnormal stress test  The various methods of treatment have been discussed with the patient and family. After consideration of risks, benefits and other options for treatment, the patient has consented to  Procedure(s): Left Heart Cath and Coronary Angiography (N/A) as a surgical intervention .  The patient's history has been reviewed, patient examined, no change in status, stable for surgery.  I have reviewed the patient's chart and labs.  Questions were answered to the patient's satisfaction.    Cath Lab Visit (complete for each Cath Lab visit)  Clinical Evaluation Leading to the Procedure:   ACS: No.  Non-ACS:    Anginal Classification: CCS I  Anti-ischemic medical therapy: Maximal Therapy (2 or more classes of medications)  Non-Invasive Test Results: Intermediate-risk stress test findings: cardiac mortality 1-3%/year  Prior CABG: No previous CABG         Nile Prisk

## 2015-02-09 NOTE — Progress Notes (Addendum)
Pt had 9 beats vtach.  Pt asymptomatic, VSS, resting comfortably.  MD Aundra Dubin paged/notified.  Will cont to monitor pt closely.  Claudette Stapler, RN

## 2015-02-09 NOTE — Consult Note (Signed)
[address]       [place] [address]             [phone]      Cardiothoracic Surgery Consultation   Reason for Consult: Left main and severe 3-vessel coronary disease Referring Physician: Dr. [name]  Henry Carroll is an 64 y.o. male.  HPI:   The patient is a 64 year old obese diabetic with hypertension, hyperlipidemia, stage 3 CKD, and chronic combined systolic and diastolic congestive heart failure who was admitted in November with an acute exacerbation of heart failure with progressive shortness of breath and lower extremity edema over several days. He never had any chest pain. He had mildly positive troponin of 0.4 that leveled off and was felt to be demand ischemia from CHF. An echo on 11/18 showed an EF of 40-45% with inferolateral hypokinesis.  There was mild to moderate MR with a structurally normal mitral valve. He improved with diuresis although his creat rose from 1.5 to 2.26 with diuresis and then came down and has hovered at 1.6-1.8. It was 1.8 pre-cath. He had a stress nuclear exam on 12/1 which showed:   Nuclear stress EF: 30%.  There was no ST segment deviation noted during stress.  Findings consistent with prior myocardial infarction with peri-infarct ischemia.  This is an intermediate risk study.  The left ventricular ejection fraction is moderately decreased (30-44%).  1. There was a medium-sized, severe basal to mid inferior and inferolateral perfusion defect. This was primarily fixed with some reversibility, suggesting primarily infarction with some peri-infarct ischemia.  2. EF 30% with inferior and inferolateral severe hypokinesis.  3. Intermediate risk study.   Cath was done today and shows:   Prox RCA lesion, 30% stenosed.  Mid RCA-1 lesion, 60% stenosed.  Mid RCA-2 lesion, 99% stenosed.  Dist RCA-1 lesion, 30% stenosed.  Dist RCA-2 lesion, 40% stenosed.  Ost RPDA lesion, 40%  stenosed.  Ramus lesion, 90% stenosed.  Prox Cx to Mid Cx lesion, 100% stenosed.  Ost LM lesion, 50% stenosed.  Prox LAD to Dist LAD lesion, 40% stenosed.  2nd Diag lesion, 40% stenosed.  Dist LAD lesion, 60% stenosed.  1. Triple vessel CAD  2. Moderate ostial left main stenosis.  3. Moderate mid LAD stenosis.  4. Severe mid RCA stenosis 5. Severe intermediate branch stenosis.  6. 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 The OM branch fills from left to left collaterals.   Since his admission for heart failure last month he has not had recurrence of edema. He still has some exertional shortness of breath but has been playing golf. He has some exertional fatigue. No chest pain or pressure.  Past Medical History  Diagnosis Date  . Diabetes mellitus   . Asthma   . Hypertension   . Anemia   . Anxiety   . Hyperlipidemia   . CKD (chronic kidney disease)   . Chronic combined systolic (congestive) and diastolic (congestive) heart failure ([place])     a. 12/31/14: 2D ECHO: EF 40-45%, HK of inf myocardium, G1DD, mod MR  . Morbid obesity ([place])     a. BMI 33    Past Surgical History  Procedure Laterality Date  . Tonsillectomy  1962  . Hernia repair  7915    Umbilical  . Spine surgery  2006    L4 & L5  . Colon surgery  2011    Colonoscopy  . Cardiac catheterization N/A 02/09/2015  Procedure: Left Heart Cath and Coronary Angiography;  Surgeon: Burnell Blanks, MD;  Location: [facility];  Service: Cardiovascular;  Laterality: N/A;    Family History  Problem Relation Age of Onset  . Diabetes Mother   . Heart disease Father     heart failure later in life  . Stroke Sister   . Hypertension Father   . Heart disease Mother     later in life, age >17  . Kidney disease Daughter   . Sudden death Daughter   . Heart attack Neg Hx     Social History:  reports that he quit smoking about 5 weeks ago. His smoking use included Cigars. He has never  used smokeless tobacco. He reports that he drinks alcohol. He reports that he does not use illicit drugs.  Allergies: No Known Allergies  Medications:  I have reviewed the patient's current medications. Prior to Admission:  Prescriptions prior to admission  Medication Sig Dispense Refill Last Dose  . albuterol (PROVENTIL) (2.5 MG/3ML) 0.083% nebulizer solution Take 3 mLs (2.5 mg total) by nebulization every 6 (six) hours as needed for wheezing or shortness of breath. 3 mL 10 02/09/2015 at Unknown time  . amLODipine (NORVASC) 10 MG tablet Take 1 tablet (10 mg total) by mouth daily. 30 tablet 6 02/09/2015 at e4  . aspirin EC 81 MG tablet Take 81 mg by mouth daily.   02/09/2015 at 0730  . atorvastatin (LIPITOR) 20 MG tablet Take 10 mg by mouth daily.   02/08/2015 at Unknown time  . carvedilol (COREG) 6.25 MG tablet Take 1 tablet (6.25 mg total) by mouth 2 (two) times daily with a meal. 60 tablet 6 02/09/2015 at 0730  . Cholecalciferol ([employer] VITAMIN D3) 1000 UNITS capsule Take 2 capsules (2,000 Units total) by mouth daily. 60 capsule 3 02/09/2015 at Unknown time  . Coenzyme Q10 (CO Q 10 PO) Take 1 tablet by mouth daily.   02/08/2015 at Unknown time  . Fluticasone-Salmeterol (ADVAIR DISKUS) 100-50 MCG/DOSE AEPB Inhale 1 puff into the lungs 2 (two) times daily.   02/09/2015 at Unknown time  . furosemide (LASIX) 40 MG tablet Take 0.5 tablets (20 mg total) by mouth daily. (Patient taking differently: Take 20 mg by mouth 2 (two) times daily. ) 90 tablet 3 02/07/2015  . hydrALAZINE (APRESOLINE) 25 MG tablet Take 1 tablet (25 mg total) by mouth 3 (three) times daily. 180 tablet 5 02/09/2015 at Unknown time  . insulin aspart (NOVOLOG) 100 UNIT/ML injection Inject 8 Units into the skin 3 (three) times daily with meals. Inject 8 u insulin only for blood sugar greater than 180. Do not skip your meals (Patient taking differently: Inject 10 Units into the skin 3 (three) times daily with meals as needed for high  blood sugar. Inject 10 u insulin only for blood sugar greater than 180. Do not skip your meals) 1 vial 6 02/08/2015 at Unknown time  . insulin detemir (LEVEMIR) 100 UNIT/ML injection Inject 0.5 mLs (50 Units total) into the skin 2 (two) times daily. 30 mL 11 02/08/2015 at Unknown time  . isosorbide mononitrate (IMDUR) 30 MG 24 hr tablet Take 1 tablet (30 mg total) by mouth daily. 90 tablet 3 not started yet  . linagliptin (TRADJENTA) 5 MG TABS tablet Take 5 mg by mouth daily.   02/08/2015 at Unknown time  . montelukast (SINGULAIR) 10 MG tablet Take 10 mg by mouth at bedtime.   02/09/2015 at Unknown time  . PROAIR HFA 108 (  90 BASE) MCG/ACT inhaler INHALE TWO PUFFS BY MOUTH EVERY 6 HOURS AS NEEDED FOR SHORTNESS OF BREATH 27 each 3 Past Week at Unknown time  . terbinafine (LAMISIL) 1 % cream Apply 1 application topically daily as needed (foot).    More than a month at Unknown time   Scheduled: . [START ON 02/10/2015] amLODipine  10 mg Oral Daily  . [START ON 02/10/2015] aspirin EC  81 mg Oral Daily  . atorvastatin  10 mg Oral Daily  . carvedilol  6.25 mg Oral BID WC  . cholecalciferol  2,000 Units Oral Daily  . furosemide  20 mg Oral BID  . hydrALAZINE  25 mg Oral TID  . insulin detemir  50 Units Subcutaneous BID  . isosorbide mononitrate  30 mg Oral Daily  . linagliptin  5 mg Oral Daily  . mometasone-formoterol  2 puff Inhalation BID  . montelukast  10 mg Oral QHS  . sodium chloride  3 mL Intravenous Q12H   Continuous: . sodium chloride 50 mL/hr (02/09/15 1356)   ELF:YBOFBP chloride, acetaminophen, albuterol, insulin aspart, ondansetron (ZOFRAN) IV, sodium chloride Anti-infectives    None      Results for orders placed or performed during the hospital encounter of 02/09/15 (from the past 48 hour(s))  Glucose, capillary     Status: Abnormal   Collection Time: 02/09/15 10:03 AM  Result Value Ref Range   Glucose-Capillary 255 (H) 65 - 99 mg/dL  Basic metabolic panel     Status:  Abnormal   Collection Time: 02/09/15 10:40 AM  Result Value Ref Range   Sodium 138 135 - 145 mmol/L   Potassium 5.3 (H) 3.5 - 5.1 mmol/L   Chloride 107 101 - 111 mmol/L   CO2 26 22 - 32 mmol/L   Glucose, Bld 284 (H) 65 - 99 mg/dL   BUN 35 (H) 6 - 20 mg/dL   Creatinine, Ser 1.80 (H) 0.61 - 1.24 mg/dL   Calcium 9.3 8.9 - 10.3 mg/dL   GFR calc non Af Amer 38 (L) >60 mL/min   GFR calc Af Amer 44 (L) >60 mL/min    Comment: (NOTE) The eGFR has been calculated using the CKD EPI equation. This calculation has not been validated in all clinical situations. eGFR's persistently <60 mL/min signify possible Chronic Kidney Disease.    Anion gap 5 5 - 15  Glucose, capillary     Status: Abnormal   Collection Time: 02/09/15  2:02 PM  Result Value Ref Range   Glucose-Capillary 236 (H) 65 - 99 mg/dL    No results found.  Review of Systems  Constitutional: Positive for malaise/fatigue. Negative for fever, chills and weight loss.  HENT: Negative.   Eyes: Negative.   Respiratory: Positive for shortness of breath. Negative for cough.   Cardiovascular: Positive for chest pain and leg swelling. Negative for palpitations, orthopnea and PND.  Gastrointestinal: Negative.   Genitourinary: Negative.   Musculoskeletal: Negative.   Skin: Negative.   Neurological: Negative.   Endo/Heme/Allergies: Negative.   Psychiatric/Behavioral: The patient is nervous/anxious.    Blood pressure 162/100, pulse 82, temperature 97.7 F (36.5 C), temperature source Oral, resp. rate 18, height '6\' 1"'$  (1.854 m), weight 108.863 kg (240 lb), SpO2 100 %. Physical Exam  Constitutional: He is oriented to person, place, and time.  Obese African-American gentleman in no distress.  HENT:  Head: Normocephalic and atraumatic.  Mouth/Throat: Oropharynx is clear and moist.  Eyes: EOM are normal. Pupils are equal, round, and reactive to light.  Neck: Normal range of motion. Neck supple. No JVD present. No thyromegaly present.   Cardiovascular: Normal rate, regular rhythm and normal heart sounds.   No murmur heard. Diminished pedal pulses  Respiratory: Effort normal and breath sounds normal. No respiratory distress. He has no rales.  GI: Soft. Bowel sounds are normal. He exhibits no distension and no mass. There is no tenderness.  Musculoskeletal: Normal range of motion. He exhibits no edema.  Lymphadenopathy:    He has no cervical adenopathy.  Neurological: He is alert and oriented to person, place, and time. He has normal strength. No cranial nerve deficit or sensory deficit.  Skin: Skin is warm and dry.  Psychiatric: He has a normal mood and affect.                [facility]*         *[facility]*            [address]            [place], [place] [address]              [phone]  ------------------------------------------------------------------- Transthoracic Echocardiography  Patient:  Tobechukwu, Emmick MR #:    [id number] Study Date: 12/31/2014 Gender:   M Age:    76 Height:   185.4 cm Weight:   113.4 kg BSA:    2.45 m^2 Pt. Status: Room:    3E21C  ADMITTING  Donato Schultz, M.D. SONOGRAPHER Nolon Rod, RDCS PERFORMING  [facility], Inpatient ATTENDING  Sonnie Alamo [id number] Suann Larry, Wynema Birch [phone]  cc:  ------------------------------------------------------------------- LV EF: 40% -  45%  ------------------------------------------------------------------- Indications:   Dyspnea 786.09.  ------------------------------------------------------------------- History:  Risk factors: Current tobacco use. Hypertension. Diabetes mellitus.  ------------------------------------------------------------------- Study Conclusions  - Left ventricle: The cavity size was normal. Wall thickness was normal. Systolic function was mildly to moderately  reduced. The estimated ejection fraction was in the range of 40% to 45%. There is hypokinesis of the inferolateral myocardium. Doppler parameters are consistent with abnormal left ventricular relaxation (grade 1 diastolic dysfunction). - Mitral valve: There was mild to moderate regurgitation.  Transthoracic echocardiography. M-mode, complete 2D, spectral Doppler, and color Doppler. Birthdate: Patient birthdate: 06-06-1950. Age: Patient is 64 yr old. Sex: Gender: male. BMI: 33 kg/m^2. Blood pressure:   143/85 Patient status: Inpatient. Study date: Study date: 12/31/2014. Study time: 12:31 PM. Location: Bedside.  -------------------------------------------------------------------  ------------------------------------------------------------------- Left ventricle: The cavity size was normal. Wall thickness was normal. Systolic function was mildly to moderately reduced. The estimated ejection fraction was in the range of 40% to 45%. Regional wall motion abnormalities:  There is hypokinesis of the inferolateral myocardium. Doppler parameters are consistent with abnormal left ventricular relaxation (grade 1 diastolic dysfunction).  ------------------------------------------------------------------- Aortic valve:  Trileaflet; mildly thickened, mildly calcified leaflets. Mobility was not restricted. Doppler: Transvalvular velocity was within the normal range. There was no stenosis. There was no regurgitation.  ------------------------------------------------------------------- Aorta: Aortic root: The aortic root was normal in size.  ------------------------------------------------------------------- Mitral valve:  Structurally normal valve.  Mobility was not restricted. Doppler: Transvalvular velocity was within the normal range. There was no evidence for stenosis. There was mild to moderate regurgitation.  Peak gradient (D): 2 mm  Hg.  ------------------------------------------------------------------- Left atrium: The atrium was normal in size.  ------------------------------------------------------------------- Right ventricle: The cavity size was normal. Wall thickness was normal. Systolic function was normal.  ------------------------------------------------------------------- Pulmonic valve:  Poorly visualized. Structurally normal valve. Cusp separation was  normal. Doppler: Transvalvular velocity was within the normal range. There was no evidence for stenosis. There was trivial regurgitation.  ------------------------------------------------------------------- Tricuspid valve:  Structurally normal valve.  Doppler: Transvalvular velocity was within the normal range. There was no regurgitation.  ------------------------------------------------------------------- Pulmonary artery:  The main pulmonary artery was normal-sized. Systolic pressure was within the normal range.  ------------------------------------------------------------------- Right atrium: The atrium was normal in size.  ------------------------------------------------------------------- Pericardium: There was no pericardial effusion.  ------------------------------------------------------------------- Systemic veins: Inferior vena cava: The vessel was normal in size.  ------------------------------------------------------------------- Measurements  Left ventricle             Value    Reference LV ID, ED, PLAX chordal     (H)  56.9 mm   43 - 52 LV ID, ES, PLAX chordal     (H)  48.5 mm   23 - 38 LV fx shortening, PLAX chordal  (L)  15  %   >=29 LV PW thickness, ED           9.48 mm   --------- IVS/LV PW ratio, ED           1.03     <=1.3 LV ejection fraction, 1-p A4C      50  %   --------- LV end-diastolic volume, 2-p      132   ml   --------- LV end-systolic volume, 2-p       75  ml   --------- LV ejection fraction, 2-p        43  %   --------- Stroke volume, 2-p           57  ml   --------- LV end-diastolic volume/bsa, 2-p    54  ml/m^2 --------- LV end-systolic volume/bsa, 2-p     31  ml/m^2 --------- Stroke volume/bsa, 2-p         23.3 ml/m^2 --------- LV e&', lateral             6.31 cm/s  --------- LV E/e&', lateral            11.22    --------- LV e&', medial              4.79 cm/s  --------- LV E/e&', medial             14.78    --------- LV e&', average             5.55 cm/s  --------- LV E/e&', average            12.76    --------- Longitudinal strain, TDI        13  %   ---------  Ventricular septum           Value    Reference IVS thickness, ED            9.75 mm   ---------  LVOT                  Value    Reference LVOT ID, S               24  mm   --------- LVOT area                4.52 cm^2  ---------  Aorta                  Value    Reference Aortic root ID, ED           37  mm   ---------  Left atrium               Value    Reference LA ID, A-P, ES             35  mm   --------- LA ID/bsa, A-P             1.43 cm/m^2 <=2.2 LA volume, S              86.1 ml   --------- LA volume/bsa, S            35.2 ml/m^2 --------- LA volume, ES, 1-p A4C         50.6 ml   --------- LA volume/bsa, ES, 1-p A4C       20.7 ml/m^2 --------- LA volume, ES, 1-p A2C         119  ml   --------- LA volume/bsa, ES, 1-p A2C       48.6 ml/m^2 ---------  Mitral valve               Value    Reference Mitral E-wave peak velocity       70.8 cm/s  --------- Mitral A-wave peak velocity       100  cm/s  --------- Mitral deceleration time        180  ms   150 - 230 Mitral peak gradient, D         2   mm Hg --------- Mitral E/A ratio, peak         0.7     ---------  Right ventricle             Value    Reference RV s&', lateral, S            18.5 cm/s  ---------  Legend: (L) and (H) mark values outside specified reference range.  ------------------------------------------------------------------- Prepared and Electronically Authenticated by  Candee Furbish, M.D. 2016-11-18T13:22:17   Assessment/Plan:  1. Left main and severe 3-vessel coronary artery disease with abnormal nuclear stress.  2. Chronic combined systolic and diastolic congestive heart failure with and EF of 30-40% by nuclear stress.  3. Stage 3 chronic kidney disease with creat of 1.8 pre-cath.   4. Morbid obesity  5. Poorly controlled hypertension  6. Poorly controlled diabetes with Hgb A1c of 9.6 on 11/30/2014 and 10-11 on multiple occasions over the past year.  7. Hyperlipidemia   I agree that his coronary disease is best treated by CABG although his operative risk is increased due to the above comorbid risk factors. I am particularly concerned about his kidney function combined with his poorly controlled diabetes and hypertension. His risk of postop acute renal failure is high. Ideally I would like to wait a week to allow his kidneys to recover maximally from his cath to minimize the risk of postop acute worsening renal failure. He has had no chest pain and his heart failure symptoms appear stable so I think he would probably be ok to go home and come back at the end of next week for CABG. He does have some MR on his echo in November but it did not look significant to me and he has no murmur.  This will require evaluation with TEE in the OR. I suspect it is ischemic MR related to his LCX and RCA disease. I discussed the operative procedure with the patient and his wife including alternatives, benefits and risks; including but not limited to bleeding, blood transfusion, infection, stroke, myocardial infarction,  graft failure, heart block requiring a permanent pacemaker, renal failure requiring dialysis, organ dysfunction, and death.  [name] understands and agrees to proceed.  I will discuss going home with cardiology and if ok will schedule for late next week.  [name] 02/09/2015, 5:39 PM

## 2015-02-09 NOTE — Progress Notes (Signed)
Labs reported, no new orders received.

## 2015-02-09 NOTE — Research (Signed)
[facility] [place] Study Informed Consent   Subject Name: Henry Carroll  Subject met inclusion and exclusion criteria.  The informed consent form, study requirements and expectations were reviewed with the subject and questions and concerns were addressed prior to the signing of the consent form.  The subject verbalized understanding of the trial requirements.  The subject agreed to participate in the [place] trial and signed the informed consent.  The informed consent was obtained prior to performance of any protocol-specific procedures for the subject.  A copy of the signed informed consent was given to the subject and a copy was placed in the subject's medical record.  Marlana Salvage 02/09/2015, 10:22

## 2015-02-10 ENCOUNTER — Ambulatory Visit (HOSPITAL_COMMUNITY): Payer: Medicare Other

## 2015-02-10 ENCOUNTER — Other Ambulatory Visit: Payer: Self-pay | Admitting: *Deleted

## 2015-02-10 ENCOUNTER — Encounter (HOSPITAL_COMMUNITY): Payer: Self-pay | Admitting: General Practice

## 2015-02-10 DIAGNOSIS — N183 Chronic kidney disease, stage 3 (moderate): Secondary | ICD-10-CM | POA: Diagnosis not present

## 2015-02-10 DIAGNOSIS — I2510 Atherosclerotic heart disease of native coronary artery without angina pectoris: Secondary | ICD-10-CM | POA: Diagnosis not present

## 2015-02-10 DIAGNOSIS — I472 Ventricular tachycardia: Secondary | ICD-10-CM

## 2015-02-10 DIAGNOSIS — R9439 Abnormal result of other cardiovascular function study: Secondary | ICD-10-CM

## 2015-02-10 DIAGNOSIS — I5042 Chronic combined systolic (congestive) and diastolic (congestive) heart failure: Secondary | ICD-10-CM

## 2015-02-10 DIAGNOSIS — I25110 Atherosclerotic heart disease of native coronary artery with unstable angina pectoris: Secondary | ICD-10-CM | POA: Diagnosis not present

## 2015-02-10 DIAGNOSIS — I4729 Other ventricular tachycardia: Secondary | ICD-10-CM

## 2015-02-10 DIAGNOSIS — I2582 Chronic total occlusion of coronary artery: Secondary | ICD-10-CM | POA: Diagnosis not present

## 2015-02-10 DIAGNOSIS — E785 Hyperlipidemia, unspecified: Secondary | ICD-10-CM | POA: Diagnosis not present

## 2015-02-10 DIAGNOSIS — J45909 Unspecified asthma, uncomplicated: Secondary | ICD-10-CM | POA: Diagnosis not present

## 2015-02-10 DIAGNOSIS — I340 Nonrheumatic mitral (valve) insufficiency: Secondary | ICD-10-CM

## 2015-02-10 DIAGNOSIS — E1122 Type 2 diabetes mellitus with diabetic chronic kidney disease: Secondary | ICD-10-CM | POA: Diagnosis not present

## 2015-02-10 HISTORY — DX: Chronic combined systolic (congestive) and diastolic (congestive) heart failure: I50.42

## 2015-02-10 HISTORY — DX: Abnormal result of other cardiovascular function study: R94.39

## 2015-02-10 LAB — BASIC METABOLIC PANEL
Anion gap: 7 (ref 5–15)
BUN: 24 mg/dL — AB (ref 6–20)
CHLORIDE: 108 mmol/L (ref 101–111)
CO2: 25 mmol/L (ref 22–32)
CREATININE: 1.34 mg/dL — AB (ref 0.61–1.24)
Calcium: 9 mg/dL (ref 8.9–10.3)
GFR calc Af Amer: >60 mL/min (ref >60)
GFR, EST NON AFRICAN AMERICAN: 54 mL/min — AB (ref >60)
Glucose, Bld: 147 mg/dL — ABNORMAL HIGH (ref 65–99)
Potassium: 4.1 mmol/L (ref 3.5–5.1)
SODIUM: 140 mmol/L (ref 135–145)

## 2015-02-10 LAB — HEPATIC FUNCTION PANEL
ALBUMIN: 3.1 g/dL — AB (ref 3.5–5.0)
ALK PHOS: 70 U/L (ref 38–126)
ALT: 24 U/L (ref 17–63)
AST: 22 U/L (ref 15–41)
BILIRUBIN TOTAL: 0.3 mg/dL (ref 0.3–1.2)
Total Protein: 5.6 g/dL — ABNORMAL LOW (ref 6.5–8.1)

## 2015-02-10 LAB — CBC
HCT: 31 % — ABNORMAL LOW (ref 39.0–52.0)
Hemoglobin: 10.5 g/dL — ABNORMAL LOW (ref 13.0–17.0)
MCH: 30.8 pg (ref 26.0–34.0)
MCHC: 33.9 g/dL (ref 30.0–36.0)
MCV: 90.9 fL (ref 78.0–100.0)
PLATELETS: 292 10*3/uL (ref 150–400)
RBC: 3.41 MIL/uL — ABNORMAL LOW (ref 4.22–5.81)
RDW: 12.4 % (ref 11.5–15.5)
WBC: 4.6 10*3/uL (ref 4.0–10.5)

## 2015-02-10 LAB — GLUCOSE, CAPILLARY: Glucose-Capillary: 76 mg/dL (ref 65–99)

## 2015-02-10 LAB — MAGNESIUM: MAGNESIUM: 2.1 mg/dL (ref 1.7–2.4)

## 2015-02-10 MED ORDER — CARVEDILOL 12.5 MG PO TABS: 12.5000 mg | ORAL_TABLET | Freq: Two times a day (BID) | ORAL | Status: DC

## 2015-02-10 MED ORDER — ATORVASTATIN CALCIUM 40 MG PO TABS
40.0000 mg | ORAL_TABLET | Freq: Every day | ORAL | Status: DC
  Administered 2015-02-10: 40 mg via ORAL
  Filled 2015-02-10: qty 1

## 2015-02-10 MED ORDER — NITROGLYCERIN 0.4 MG SL SUBL: 0.4000 mg | SUBLINGUAL_TABLET | SUBLINGUAL | Status: DC | PRN

## 2015-02-10 MED ORDER — ATORVASTATIN CALCIUM 40 MG PO TABS: 40.0000 mg | ORAL_TABLET | Freq: Every evening | ORAL | Status: DC

## 2015-02-10 MED ORDER — INSULIN ASPART 100 UNIT/ML ~~LOC~~ SOLN: 10.0000 [IU] | Freq: Three times a day (TID) | SUBCUTANEOUS | Status: DC | PRN

## 2015-02-10 MED ORDER — FUROSEMIDE 40 MG PO TABS: 20.0000 mg | ORAL_TABLET | Freq: Two times a day (BID) | ORAL | Status: DC

## 2015-02-10 MED ORDER — ATORVASTATIN CALCIUM 80 MG PO TABS: 80.0000 mg | ORAL_TABLET | Freq: Every day | ORAL | Status: DC

## 2015-02-10 MED ORDER — CARVEDILOL 12.5 MG PO TABS
12.5000 mg | ORAL_TABLET | Freq: Two times a day (BID) | ORAL | Status: DC
  Administered 2015-02-10: 12.5 mg via ORAL
  Filled 2015-02-10: qty 1

## 2015-02-10 MED ORDER — INSULIN ASPART 100 UNIT/ML ~~LOC~~ SOLN: 8.0000 [IU] | Freq: Three times a day (TID) | SUBCUTANEOUS | Status: DC | PRN

## 2015-02-10 NOTE — Progress Notes (Signed)
CARDIAC REHAB PHASE I   PRE:  Rate/Rhythm: 86 SR  BP:  Sitting: 149/79        SaO2: 100 RA  MODE:  Ambulation: 550 ft   POST:  Rate/Rhythm: 99 RA  BP:  Sitting: 154/89         SaO2: 100 RA  Pt ambulated 550 ft ion RA, independent, steady gait, tolerated well. Pt denies CP, dizziness, DOE, declined rest stop. Completed cardiac surgery pre-op education with pt at bedside. Reviewed sternal precautions, IS, activity progression, cardiac surgery booklet and cardiac surgery guidelines. Provided pt with viewing instructions for cardiac surgery videos. Discussed radial site care. Pt verbalized understanding. Pt to bed per pt request after walk, call bell within reach. Will follow post-op.  [id number] Lenna Sciara, RN, BSN 02/10/2015 9:57 AM

## 2015-02-10 NOTE — Discharge Summary (Signed)
Discharge Summary   Patient ID: SAMIR BUTH,  MRN: [id number], DOB/AGE: Oct 20, 1950 64 y.o.  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 _____________   History of Present Illness  Mr. Redondo is a 64 y/o M with history of HTN, HLD, DM2, CKD stage III, COPD, obesity, asthma, recently diagnosed combined CHF who was admitted for cath due to abnormal nuc.  Hospital Course   He was diagnosed with CHF in 12/2014 when 2D Echo 12/31/14: EF 40-45%, HK of inferolateral myocardium, grade 1 DD, mild-mod MR. Since his diagnosis he's had some issues with creatinine increase requiring adjustments in his diuretics. He saw nephrology as an outpatient who settled on dose of Lasix at 20mg  BID. He is no longer on ACEI/ARB at present time due to renal dysfunction. He underwent nuclear stress testing 01/13/15 which showed medium-sized, severe basal to mid inferior and inferolateral perfusion defect, primarily fixed with some reversibility, suggesting primarily infarction with some peri-infarct ischemia, EF 30% with inferior and inferolateral severe HK. Cardiac cath was recommended. He presented for this yesterday which demonstrated triple vessel CAD (mod oLM, mLAD, severe mRCA, intermediate branch stenosis, CTO of mCx). He was seen by [facility] who recommended bypass surgery.  Their preference was to delay until next week to give kidneys time to recover maximally from cath. Post-cath Cr was 1.34 (pre-cath 1.80, baseline variable). [name] also plans to evaluate his MR intraoperatively via TEE. He suspects it is ischemic MR related to his LCX and RCA disease. The patient did well post-cath. He did have 9 beats NSVT on telemetry. K 4.1, Mg 2.1. His beta blocker was titrated (also to help with elevated BP). Given new dx of multivessel CAD, atorvastatin was titrated to 40mg  daily. Dr. Irish Lack has seen and examined the patient today and feels he is stable for discharge. I spoke with the nurse coordinator for [facility] who has scheduled pre-op testing for the patient on 1/4 and booked his CABG for 1/6. They will call the patient when he gets home to go over detailed instructions.    Consultants: [name]  _____________  Discharge Vitals Blood pressure 157/82, pulse 80, temperature 98 F (36.7 C), temperature source Oral, resp. rate 18, height 6\' 1"  (1.854 m), weight 240 lb (108.863 kg), SpO2 100 %.  Filed Weights   02/09/15 0950  Weight: 240 lb (108.863 kg)   _____________  Labs     CBC  Recent Labs  02/10/15 0306  WBC 4.6  HGB 10.5*  HCT 31.0*  MCV 90.9  PLT 123456   Basic Metabolic Panel  Recent Labs  02/09/15 1040 02/10/15 0306  NA 138 140  K 5.3* 4.1  CL 107 108  CO2 26 25  GLUCOSE 284* 147*  BUN 35* 24*  CREATININE 1.80* 1.34*  CALCIUM 9.3 9.0  MG  --  2.1   Liver Function Tests  Recent Labs  02/10/15 0306  AST 22  ALT 24  ALKPHOS 70  BILITOT 0.3  PROT 5.6*  ALBUMIN 3.1*  _____________  Disposition   Pt is being discharged home today in good condition. _____________  Follow-up Plans & Appointments    Follow-up Information    Follow up with [facility].   Why:  02/16/15 at 8am - please check in at Admissions in the [facility] for your pre-operative appointment, lung function testing, and  carotid ultrasound.      Follow up with [facility].   Why:  02/18/15 for your bypass - Dr. Vivi Martens office will call you with further details including time and instructions.      Follow up with Candee Furbish, MD.   Specialty:  Cardiology   Why:  Please call us if you have any questions. We will plan to schedule your follow-up appointment after your surgery.   Contact information:   [id number] [place] [address] [phone]       Follow up with [name], MD.   Specialty:  Cardiothoracic Surgery   Why:  This is Dr. Vivi Martens contact information if you have any questions for their office.   Contact information:   [address] [address] [phone]      Discharge Instructions    Diet - low sodium heart healthy    Complete by:  As directed      Increase activity slowly    Complete by:  As directed   Your carvedilol has been increased (new dose/prescription) for blood pressure and heart disease. Your atorvastatin has been increased (new dose/prescription) to help prevent progression of plaque in your arteries. You have been prescribed nitroglycerin to take as needed for chest pain.  No driving until cleared by your cardiologist. No lifting over 10 lbs or sexual activity until cleared by your cardiologist. Keep procedure site clean & dry. If you notice increased pain, swelling, bleeding or pus, call/return!  You may shower, but no soaking baths/hot tubs/pools for 1 week.           Discharge Medications   Current Discharge Medication List    START taking these medications   Details  nitroGLYCERIN (NITROSTAT) 0.4 MG SL tablet Place 1 tablet (0.4 mg total) under the tongue every 5 (five) minutes as needed for chest pain (up to 3 doses). Qty: 25 tablet, Refills: 3      CONTINUE these medications which have CHANGED   Details  atorvastatin (LIPITOR) 40 MG tablet Take 1 tablet (40 mg total) by mouth every  evening. Qty: 30 tablet, Refills: 6    carvedilol (COREG) 12.5 MG tablet Take 1 tablet (12.5 mg total) by mouth 2 (two) times daily with a meal. Qty: 60 tablet, Refills: 6    furosemide (LASIX) 40 MG tablet Take 0.5 tablets (20 mg total) by mouth 2 (two) times daily. *NOT changed - this is how the patient was taking prior to admission   Associated Diagnoses: Other secondary hypertension      CONTINUE these medications which have NOT CHANGED   Details  albuterol (PROVENTIL) (2.5 MG/3ML) 0.083% nebulizer solution Take 3 mLs (2.5 mg total) by nebulization every 6 (six) hours as needed for wheezing or shortness of breath.     amLODipine (NORVASC) 10 MG tablet Take 1 tablet (10 mg total) by mouth daily.    Associated Diagnoses: Essential hypertension    aspirin EC 81 MG  tablet Take 81 mg by mouth daily.    Cholecalciferol ([employer] VITAMIN D3) 1000 UNITS capsule Take 2 capsules (2,000 Units total) by mouth daily.     Coenzyme Q10 (CO Q 10 PO) Take 1 tablet by mouth daily.    Fluticasone-Salmeterol (ADVAIR DISKUS) 100-50 MCG/DOSE AEPB Inhale 1 puff into the lungs 2 (two) times daily.    hydrALAZINE (APRESOLINE) 25 MG tablet Take 1 tablet (25 mg total) by mouth 3 (three) times daily.     insulin aspart (NOVOLOG) 100 UNIT/ML injection Inject 8 Units into the skin 3 (three) times daily before meals. Inject 8 u insulin only for blood sugar greater than 180. Do not skip your meals    insulin detemir (LEVEMIR) 100 UNIT/ML injection Inject 0.5 mLs (50 Units total) into the skin 2 (two) times daily.    isosorbide mononitrate (IMDUR) 30 MG 24 hr tablet Take 1 tablet (30 mg total) by mouth daily.     linagliptin (TRADJENTA) 5 MG TABS tablet Take 5 mg by mouth daily.    montelukast (SINGULAIR) 10 MG tablet Take 10 mg by mouth at bedtime.    PROAIR HFA 108 (90 BASE) MCG/ACT inhaler INHALE TWO PUFFS BY MOUTH EVERY 6 HOURS AS NEEDED FOR SHORTNESS OF BREATH     terbinafine (LAMISIL) 1 % cream  Apply 1 application topically daily as needed (foot).           Outstanding Labs/Studies   N/A  Duration of Discharge Encounter   Greater than 30 minutes including physician time.  Signed, Melina Copa PA-C 02/10/2015, 11:04 AM  I have examined the patient and reviewed assessment and plan and discussed with patient. Agree with above as stated. Radial site intact. 2+ pulse present. CABG a week from tomorrow. Walked with rehab and did well. OK to discharge to allow renal function to improve which it has already done after 1 day.  Wende Longstreth S.

## 2015-02-10 NOTE — Progress Notes (Signed)
Patient: Henry Carroll / Admit Date: 02/09/2015 / Date of Encounter: 02/10/2015, 8:08 AM   Subjective: No CP, SOB, orthopnea. Slept well.   Objective: Telemetry: NSR, occ PVCs, 9 beats NSVT last night Physical Exam: Blood pressure 157/82, pulse 80, temperature 98 F (36.7 C), temperature source Oral, resp. rate 18, height 6\' 1"  (1.854 m), weight 240 lb (108.863 kg), SpO2 100 %. General: Well developed, well nourished AAM in no acute distress. Lying flat in bed without dyspnea. Head: Normocephalic, atraumatic, sclera non-icteric, no xanthomas, nares are without discharge. Neck: Negative for carotid bruits. JVP not elevated. Lungs: Clear bilaterally to auscultation without wheezes, rales, or rhonchi. Breathing is unlabored. Heart: RRR S1 S2 without murmurs, rubs, or gallops.  Abdomen: Soft, non-tender, non-distended with normoactive bowel sounds. No rebound/guarding.  Extremities: No clubbing or cyanosis. No edema. Distal pedal pulses are 2+ and equal bilaterally. Right radial cath site without hematoma or ecchymosis; good pulse. Neuro: Alert and oriented X 3. Moves all extremities spontaneously. Psych:  Responds to questions appropriately with a normal affect.   Intake/Output Summary (Last 24 hours) at 02/10/15 0808 Last data filed at 02/10/15 0523  Gross per 24 hour  Intake      0 ml  Output   2625 ml  Net  -2625 ml    Inpatient Medications:  . amLODipine  10 mg Oral Daily  . aspirin EC  81 mg Oral Daily  . atorvastatin  40 mg Oral Daily  . carvedilol  6.25 mg Oral BID WC  . cholecalciferol  2,000 Units Oral Daily  . furosemide  20 mg Oral BID  . hydrALAZINE  25 mg Oral TID  . insulin detemir  50 Units Subcutaneous BID  . isosorbide mononitrate  30 mg Oral Daily  . linagliptin  5 mg Oral Daily  . mometasone-formoterol  2 puff Inhalation BID  . montelukast  10 mg Oral QHS  . sodium chloride  3 mL Intravenous Q12H   Infusions:    Labs:  Recent Labs   02/09/15 1040 02/10/15 0306  NA 138 140  K 5.3* 4.1  CL 107 108  CO2 26 25  GLUCOSE 284* 147*  BUN 35* 24*  CREATININE 1.80* 1.34*  CALCIUM 9.3 9.0    Recent Labs  02/10/15 0306  WBC 4.6  HGB 10.5*  HCT 31.0*  MCV 90.9  PLT 292    Radiology/Studies:  US Renal  02/02/2015  CLINICAL DATA:  Stage III chronic renal disease EXAM: RENAL / URINARY TRACT ULTRASOUND COMPLETE COMPARISON:  None. FINDINGS: Right Kidney: Length: 11.2 cm. Echogenicity and renal cortical thickness are within normal limits. No mass, perinephric fluid, or hydronephrosis visualized. No sonographically demonstrable calculus or ureterectasis. Left Kidney: Length: 11.3 cm. Echogenicity and renal cortical thickness are within normal limits. No mass, perinephric fluid, or hydronephrosis visualized. No sonographically demonstrable calculus or ureterectasis. Bladder: Appears normal for degree of bladder distention. IMPRESSION: Study within normal limits. Electronically Signed   By: Lowella Grip III M.D.   On: 02/02/2015 14:36     Assessment and Plan  23M with HTN, HLD, DM2, CKD stage III, COPD, morbid obesity, asthma, recently diagnosed combined CHF who was admitted for cath due to abnormal nuc. 2D Echo 12/31/14: EF 40-45%, HK of inferolateral myocardium, grade 1 DD, mild-mod MR. LHC 02/09/15: triple vessel CAD (mod oLM, mLAD, severe mRCA, intermediate branch stenosis, CTO of mCx).   1. Triple vessel CAD - [facility]CTS recommending to consider d/c home with outpatient return for CABG to  give kidneys time to recover maximally from cath. Will review plan with MD.  2. Mitral regurgitation - plans for evaluation by TEE in OR per [name].   3. Chronic combined CHF (dx 12/2014) EF 40-45% - appears euvolemic. BP suboptimal. Will increase Coreg to 12.5mg  BID. Not on ACEI/ARB due to CKD and recent fluctuations in Cr.   4. CKD stage III - Cr stable post-cath. On Lasix 20mg  BID per nephrology as outpatient; will continue.  5.  Essential HTN - see above.  6. Hyperlipidemia - will titrate statin to 40mg  daily given newly diagnosed multivessel CAD. Add baseline LFTs. LDL in October was 83 on lower dose Lipitor.  7. NSVT - add Mg to labs. Will titrate carvedilol.  Signed, Melina Copa PA-C Pager: [phone]   I have examined the patient and reviewed assessment and plan and discussed with patient.  Agree with above as stated.  Radial site intact.  2+ pulse present.  CABG a week from tomorrow.  Walked with rehab and did well.  OK to discharge to allow renal function to improve which it has already done after 1 day.  Aris Even S.

## 2015-02-11 ENCOUNTER — Encounter (HOSPITAL_COMMUNITY): Payer: Medicare Other

## 2015-02-16 ENCOUNTER — Encounter (HOSPITAL_COMMUNITY)
Admit: 2015-02-16 | Discharge: 2015-02-16 | Disposition: A | Discharge status: Home / Self Care | Payer: Medicare Other | Attending: Surgery | Admitting: Surgery

## 2015-02-16 ENCOUNTER — Ambulatory Visit (HOSPITAL_BASED_OUTPATIENT_CLINIC_OR_DEPARTMENT_OTHER)
Admit: 2015-02-16 | Discharge: 2015-02-16 | Disposition: A | Discharge status: Home / Self Care | Payer: Medicare Other | Attending: Surgery | Admitting: Surgery

## 2015-02-16 ENCOUNTER — Encounter (HOSPITAL_COMMUNITY)
Admission: RE | Admit: 2015-02-16 | Discharge: 2015-02-16 | Disposition: A | Discharge status: Home / Self Care | Payer: Medicare Other | Source: Ambulatory Visit | Attending: Surgery | Admitting: Surgery

## 2015-02-16 ENCOUNTER — Ambulatory Visit (HOSPITAL_COMMUNITY)
Admit: 2015-02-16 | Discharge: 2015-02-16 | Disposition: A | Discharge status: Home / Self Care | Payer: Medicare Other | Attending: Surgery | Admitting: Surgery

## 2015-02-16 ENCOUNTER — Encounter (HOSPITAL_COMMUNITY): Payer: Self-pay

## 2015-02-16 VITALS — BP 151/83 | HR 81 | Temp 98.4°F | Resp 18 | Ht 73.0 in | Wt 244.0 lb

## 2015-02-16 DIAGNOSIS — I2510 Atherosclerotic heart disease of native coronary artery without angina pectoris: Secondary | ICD-10-CM

## 2015-02-16 DIAGNOSIS — I130 Hypertensive heart and chronic kidney disease with heart failure and stage 1 through stage 4 chronic kidney disease, or unspecified chronic kidney disease: Secondary | ICD-10-CM | POA: Diagnosis not present

## 2015-02-16 DIAGNOSIS — I5042 Chronic combined systolic (congestive) and diastolic (congestive) heart failure: Secondary | ICD-10-CM | POA: Diagnosis not present

## 2015-02-16 DIAGNOSIS — Z01818 Encounter for other preprocedural examination: Secondary | ICD-10-CM | POA: Diagnosis not present

## 2015-02-16 HISTORY — DX: Unspecified osteoarthritis, unspecified site: M19.90

## 2015-02-16 HISTORY — DX: Acute myocardial infarction, unspecified: I21.9

## 2015-02-16 LAB — PULMONARY FUNCTION TEST
DL/VA % PRED: 65 %
DL/VA: 3.13 ml/min/mmHg/L
DLCO UNC % PRED: 56 %
DLCO UNC: 20.54 ml/min/mmHg
FEF 25-75 POST: 1.43 L/s
FEF 25-75 PRE: 0.78 L/s
FEF2575-%CHANGE-POST: 83 %
FEF2575-%PRED-POST: 48 %
FEF2575-%PRED-PRE: 26 %
FEV1-%Change-Post: 18 %
FEV1-%Pred-Post: 65 %
FEV1-%Pred-Pre: 55 %
FEV1-Post: 2.2 L
FEV1-Pre: 1.86 L
FEV1FVC-%CHANGE-POST: 2 %
FEV1FVC-%PRED-PRE: 76 %
FEV6-%CHANGE-POST: 13 %
FEV6-%Pred-Post: 77 %
FEV6-%Pred-Pre: 68 %
FEV6-PRE: 2.87 L
FEV6-Post: 3.26 L
FEV6FVC-%CHANGE-POST: -1 %
FEV6FVC-%PRED-PRE: 95 %
FEV6FVC-%Pred-Post: 93 %
FVC-%CHANGE-POST: 15 %
FVC-%PRED-POST: 83 %
FVC-%Pred-Pre: 72 %
FVC-Post: 3.62 L
FVC-Pre: 3.14 L
POST FEV1/FVC RATIO: 61 %
Post FEV6/FVC ratio: 90 %
Pre FEV1/FVC ratio: 59 %
Pre FEV6/FVC Ratio: 91 %
RV % PRED: 141 %
RV: 3.62 L
TLC % pred: 89 %
TLC: 6.83 L

## 2015-02-16 LAB — URINALYSIS, ROUTINE W REFLEX MICROSCOPIC
BILIRUBIN URINE: NEGATIVE
GLUCOSE, UA: NEGATIVE mg/dL
Hgb urine dipstick: NEGATIVE
KETONES UR: NEGATIVE mg/dL
LEUKOCYTES UA: NEGATIVE
NITRITE: NEGATIVE
Specific Gravity, Urine: 1.015 (ref 1.005–1.030)
pH: 6.5 (ref 5.0–8.0)

## 2015-02-16 LAB — ABO/RH: ABO/RH(D): A POS

## 2015-02-16 LAB — BLOOD GAS, ARTERIAL
ACID-BASE EXCESS: 2.6 mmol/L — AB (ref 0.0–2.0)
Bicarbonate: 26.6 mEq/L — ABNORMAL HIGH (ref 20.0–24.0)
FIO2: 0.21
O2 SAT: 96.5 %
PATIENT TEMPERATURE: 98.6
PCO2 ART: 40.9 mmHg (ref 35.0–45.0)
TCO2: 27.9 mmol/L (ref 0–100)
pH, Arterial: 7.429 (ref 7.350–7.450)
pO2, Arterial: 83.6 mmHg (ref 80.0–100.0)

## 2015-02-16 LAB — CBC
HCT: 33.8 % — ABNORMAL LOW (ref 39.0–52.0)
Hemoglobin: 11.3 g/dL — ABNORMAL LOW (ref 13.0–17.0)
MCH: 30.1 pg (ref 26.0–34.0)
MCHC: 33.4 g/dL (ref 30.0–36.0)
MCV: 89.9 fL (ref 78.0–100.0)
PLATELETS: 316 10*3/uL (ref 150–400)
RBC: 3.76 MIL/uL — AB (ref 4.22–5.81)
RDW: 12.2 % (ref 11.5–15.5)
WBC: 4.2 10*3/uL (ref 4.0–10.5)

## 2015-02-16 LAB — URINE MICROSCOPIC-ADD ON

## 2015-02-16 LAB — COMPREHENSIVE METABOLIC PANEL
ALBUMIN: 3.5 g/dL (ref 3.5–5.0)
ALT: 25 U/L (ref 17–63)
AST: 26 U/L (ref 15–41)
Alkaline Phosphatase: 75 U/L (ref 38–126)
Anion gap: 9 (ref 5–15)
BUN: 31 mg/dL — AB (ref 6–20)
CHLORIDE: 104 mmol/L (ref 101–111)
CO2: 25 mmol/L (ref 22–32)
CREATININE: 1.63 mg/dL — AB (ref 0.61–1.24)
Calcium: 9.4 mg/dL (ref 8.9–10.3)
GFR calc Af Amer: 49 mL/min — ABNORMAL LOW (ref >60)
GFR calc non Af Amer: 42 mL/min — ABNORMAL LOW (ref >60)
Glucose, Bld: 208 mg/dL — ABNORMAL HIGH (ref 65–99)
Potassium: 5 mmol/L (ref 3.5–5.1)
SODIUM: 138 mmol/L (ref 135–145)
Total Bilirubin: 0.3 mg/dL (ref 0.3–1.2)
Total Protein: 6.5 g/dL (ref 6.5–8.1)

## 2015-02-16 LAB — APTT: APTT: 26 s (ref 24–37)

## 2015-02-16 LAB — SURGICAL PCR SCREEN
MRSA, PCR: NEGATIVE
STAPHYLOCOCCUS AUREUS: NEGATIVE

## 2015-02-16 LAB — PROTIME-INR
INR: 0.98 (ref 0.00–1.49)
Prothrombin Time: 13.2 seconds (ref 11.6–15.2)

## 2015-02-16 LAB — GLUCOSE, CAPILLARY: Glucose-Capillary: 208 mg/dL — ABNORMAL HIGH (ref 65–99)

## 2015-02-16 MED ORDER — ALBUTEROL SULFATE (2.5 MG/3ML) 0.083% IN NEBU
2.5000 mg | INHALATION_SOLUTION | Freq: Once | RESPIRATORY_TRACT | Status: AC
  Administered 2015-02-16: 2.5 mg via RESPIRATORY_TRACT

## 2015-02-16 NOTE — Progress Notes (Signed)
Pre-op Cardiac Surgery  Carotid Findings:   Findings suggest 1-39% internal carotid artery stenosis bilaterally. Vertebral arteries are patent with antegrade flow.  Upper Extremity Right Left  Brachial Pressures 171-Triphasic 178-Triphasic  Radial Waveforms Triphasic Triphasic  Ulnar Waveforms Triphasic Triphasic  Palmar Arch (Allen's Test) Within normal limits Within normal limits    Lower  Extremity Right Left  Dorsalis Pedis 83-Monophasic 137-Monophasic  Anterior Tibial    Posterior Tibial 143-Triphasic 113-Triphasic  Great toe 111 49  Ankle/Brachial Indices 0.80 0.77  Toe/Brachial Indices 0.62 0.28    Findings:   The right ABI is suggestive of mild, borderline moderate arterial insufficiency at rest. The left ABI is suggestive of moderate arterial insufficiency at rest. Bilateral TBIs are abnormal.  02/16/2015 11:55 AM Maudry Mayhew, RVT, RDCS, RDMS

## 2015-02-16 NOTE — Pre-Procedure Instructions (Addendum)
Henry Carroll  02/16/2015      [id number] - [place], [place] [address] Phone: [phone] Fax: [phone]  [facility] [place], [place] [id number] [place] [id number]  [address] [place] [address] Phone: [phone] Fax: [phone]    Your procedure is scheduled on Fri, Jan 6 @ 7:30 AM  Report to [facility] Admitting at 5:30 AM  Call this number if you have problems the morning of surgery:  [phone]   Remember:  Do not eat food or drink liquids after midnight.  Take these medicines the morning of surgery with A SIP OF WATER Albuterol Neb Treatment,Carvedilol(Coreg),Advair<Bring Your Inhaler With You>               No Goody's,BC's,Aleve,Aspirin,Ibuprofen,Motrin,Advil,Fish Oil,or any Herbal Medications.            How to Manage Your Diabetes Before Surgery   Why is it important to control my blood sugar before and after surgery?   Improving blood sugar levels before and after surgery helps healing and can limit problems.  A way of improving blood sugar control is eating a healthy diet by:  - Eating less sugar and carbohydrates  - Increasing activity/exercise  - Talk with your doctor about reaching your blood sugar goals  High blood sugars (greater than 180 mg/dL) can raise your risk of infections and slow down your recovery so you will need to focus on controlling your diabetes during the weeks before surgery.  Make sure that the doctor who takes care of your diabetes knows about your planned surgery including the date and location.  How do I manage my blood sugars before surgery?   Check your blood sugar at least 4 times a day, 2 days before surgery to make sure that they are not too high or low.   Check your blood sugar the morning of your surgery when you wake up and every 2               hours until you get to the Short-Stay unit.  If your blood sugar is less than 70 mg/dL, you will need to  treat for low blood sugar by:  Treat a low blood sugar (less than 70 mg/dL) with 1/2 cup of clear juice (cranberry or apple), 4 glucose tablets, OR glucose gel.  Recheck blood sugar in 15 minutes after treatment (to make sure it is greater than 70 mg/dL).  If blood sugar is not greater than 70 mg/dL on re-check, call [phone] for further instructions.   Report your blood sugar to the Short-Stay nurse when you get to Short-Stay.  References:  [facility], 2007 "How to Manage your Diabetes Before and After Surgery".  What do I do about my diabetes medications?   Do not take oral diabetes medicines (pills) the morning of surgery.  THE NIGHT BEFORE SURGERY, take 40 units of Levemir Insulin.    THE MORNING OF SURGERY, take 25 units of Levemir Insulin.    Do not take other diabetes injectables the day of surgery including Byetta, Victoza, Bydureon, and Trulicity.    If your CBG is greater than 220 mg/dL, you may take 1/2 of your sliding scale (correction) dose of insulin.   For patients with "Insulin Pumps":  Contact your diabetes doctor for specific instructions before surgery.   Decrease basal insulin rates by 20% at midnight the night before surgery.  Note that if  your surgery is planned to be longer than 2 hours, your insulin pump will be removed and intravenous (IV) insulin will be started and managed by the nurses and anesthesiologist.  You will be able to restart your insulin pump once you are awake and able to manage it.  Make sure to bring insulin pump supplies to the hospital with you in case your site needs to be changed.         Do not wear jewelry  Do not wear lotions, powders, or colognes.    Do not shave 48 hours prior to surgery.  Men may shave face and neck.  Do not bring valuables to the hospital.  [facility] is not responsible for any belongings or valuables.  Contacts, dentures or bridgework may not be worn into surgery.   Leave your suitcase in the car.  After surgery it may be brought to your room.  For patients admitted to the hospital, discharge time will be determined by your treatment team.  Patients discharged the day of surgery will not be allowed to drive home.    Special instructions:  [facility] - Preparing for Surgery  Before surgery, you can play an important role.  Because skin is not sterile, your skin needs to be as free of germs as possible.  You can reduce the number of germs on you skin by washing with CHG (chlorahexidine gluconate) soap before surgery.  CHG is an antiseptic cleaner which kills germs and bonds with the skin to continue killing germs even after washing.  Please DO NOT use if you have an allergy to CHG or antibacterial soaps.  If your skin becomes reddened/irritated stop using the CHG and inform your nurse when you arrive at Short Stay.  Do not shave (including legs and underarms) for at least 48 hours prior to the first CHG shower.  You may shave your face.  Please follow these instructions carefully:   1.  Shower with CHG Soap the night before surgery and the                                morning of Surgery.  2.  If you choose to wash your hair, wash your hair first as usual with your       normal shampoo.  3.  After you shampoo, rinse your hair and body thoroughly to remove the                      Shampoo.  4.  Use CHG as you would any other liquid soap.  You can apply chg directly       to the skin and wash gently with scrungie or a clean washcloth.  5.  Apply the CHG Soap to your body ONLY FROM THE NECK DOWN.        Do not use on open wounds or open sores.  Avoid contact with your eyes,       ears, mouth and genitals (private parts).  Wash genitals (private parts)       with your normal soap.  6.  Wash thoroughly, paying special attention to the area where your surgery        will be performed.  7.  Thoroughly rinse your body with warm water from the neck down.  8.  DO  NOT shower/wash with your normal soap after using and rinsing off  the CHG Soap.  9.  Pat yourself dry with a clean towel.            10.  Wear clean pajamas.            11.  Place clean sheets on your bed the night of your first shower and do not        sleep with pets.  Day of Surgery  Do not apply any lotions/deoderants the morning of surgery.  Please wear clean clothes to the hospital/surgery center.    Please read over the following fact sheets that you were given. Pain Booklet, Coughing and Deep Breathing, Blood Transfusion Information, MRSA Information and Surgical Site Infection Prevention

## 2015-02-16 NOTE — Progress Notes (Signed)
Cardiologist: [name] Endocrinologist: Dr. Gildardo Cranker  Neurologist: Dr.Mattingley  Fasting blood sugars 90-202

## 2015-02-17 LAB — HEMOGLOBIN A1C
Hgb A1c MFr Bld: 8.7 % — ABNORMAL HIGH (ref 4.8–5.6)
Mean Plasma Glucose: 203 mg/dL

## 2015-02-17 MED ORDER — DOPAMINE-DEXTROSE 3.2-5 MG/ML-% IV SOLN
0.0000 ug/kg/min | INTRAVENOUS | Status: AC
  Administered 2015-02-18: 2 ug/kg/min via INTRAVENOUS
  Filled 2015-02-17: qty 250

## 2015-02-17 MED ORDER — SODIUM CHLORIDE 0.9 % IV SOLN
INTRAVENOUS | Status: DC
  Filled 2015-02-17: qty 30

## 2015-02-17 MED ORDER — DEXTROSE 5 % IV SOLN
1.5000 g | INTRAVENOUS | Status: AC
  Administered 2015-02-18: .75 g via INTRAVENOUS
  Administered 2015-02-18: 1.5 g via INTRAVENOUS
  Filled 2015-02-17: qty 1.5

## 2015-02-17 MED ORDER — DEXMEDETOMIDINE HCL IN NACL 400 MCG/100ML IV SOLN
0.1000 ug/kg/h | INTRAVENOUS | Status: AC
  Administered 2015-02-18: .3 ug/kg/h via INTRAVENOUS
  Filled 2015-02-17: qty 100

## 2015-02-17 MED ORDER — POTASSIUM CHLORIDE 2 MEQ/ML IV SOLN
80.0000 meq | INTRAVENOUS | Status: DC
  Filled 2015-02-17: qty 40

## 2015-02-17 MED ORDER — PLASMA-LYTE 148 IV SOLN
INTRAVENOUS | Status: AC
  Administered 2015-02-18: 500 mL
  Filled 2015-02-17: qty 2.5

## 2015-02-17 MED ORDER — NITROGLYCERIN IN D5W 200-5 MCG/ML-% IV SOLN
2.0000 ug/min | INTRAVENOUS | Status: AC
  Administered 2015-02-18: 5 ug/min via INTRAVENOUS
  Filled 2015-02-17: qty 250

## 2015-02-17 MED ORDER — INSULIN REGULAR HUMAN 100 UNIT/ML IJ SOLN
INTRAMUSCULAR | Status: AC
  Administered 2015-02-18: 1 [IU]/h via INTRAVENOUS
  Filled 2015-02-17: qty 2.5

## 2015-02-17 MED ORDER — CHLORHEXIDINE GLUCONATE 4 % EX LIQD: 30.0000 mL | CUTANEOUS | Status: DC

## 2015-02-17 MED ORDER — EPINEPHRINE HCL 1 MG/ML IJ SOLN
0.0000 ug/min | INTRAVENOUS | Status: DC
  Filled 2015-02-17: qty 4

## 2015-02-17 MED ORDER — VANCOMYCIN HCL 10 G IV SOLR
1500.0000 mg | INTRAVENOUS | Status: AC
  Administered 2015-02-18: 1500 mg via INTRAVENOUS
  Filled 2015-02-17 (×2): qty 1500

## 2015-02-17 MED ORDER — PHENYLEPHRINE HCL 10 MG/ML IJ SOLN
30.0000 ug/min | INTRAVENOUS | Status: AC
  Administered 2015-02-18: 20 ug/min via INTRAVENOUS
  Filled 2015-02-17: qty 2

## 2015-02-17 MED ORDER — SODIUM CHLORIDE 0.9 % IV SOLN
INTRAVENOUS | Status: AC
  Administered 2015-02-18: 69.8 mL/h via INTRAVENOUS
  Filled 2015-02-17: qty 40

## 2015-02-17 MED ORDER — MAGNESIUM SULFATE 50 % IJ SOLN
40.0000 meq | INTRAMUSCULAR | Status: DC
  Filled 2015-02-17: qty 10

## 2015-02-17 MED ORDER — DEXTROSE 5 % IV SOLN
750.0000 mg | INTRAVENOUS | Status: DC
  Filled 2015-02-17: qty 750

## 2015-02-17 MED ORDER — METOPROLOL TARTRATE 12.5 MG HALF TABLET: 12.5000 mg | ORAL_TABLET | Freq: Once | ORAL | Status: DC

## 2015-02-17 MED ORDER — CHLORHEXIDINE GLUCONATE 0.12 % MT SOLN
15.0000 mL | Freq: Once | OROMUCOSAL | Status: DC
  Filled 2015-02-17: qty 15

## 2015-02-17 NOTE — Progress Notes (Signed)
Anesthesia Chart Review: Patient is a 65 year old male scheduled for CABG on 02/28/15 by [name]. Notes indicate that MV will be further evaluated by TEE intraoperatively (mild to moderate MR may be ischemic from LCX and RCA disease).   History includes CAD, chronic systolic CHF, NSVT, mild-moderate mitral regurgitation, recent former smoker (quit 12/31/14), DM2, asthma, HTN, anemia, anxiety, HLD, CKD stage III, tonsillectomy, umbilical hernia repair, L3-4 microdiskectomy '05. BMI is consistent with obesity. PCP is Dr. Gildardo Cranker. Cardiologist is [name]. Notes indicate he is also followed by a nephrologist (Dr. Mercy Moore??).    Meds include albuterol, amlodipine, aspirin 81 mg, Lipitor, Coreg, Advair, Lasix, hydralazine, NovoLog, Levemir, Imdur (not started), Tradjenta, Singulair, Nitro.  02/09/15 Cardiac cath (done due to new onset systolic CHF with abnormal stress test):  Prox RCA lesion, 30% stenosed.  Mid RCA-1 lesion, 60% stenosed.  Mid RCA-2 lesion, 99% stenosed.  Dist RCA-1 lesion, 30% stenosed.  Dist RCA-2 lesion, 40% stenosed.  Ost RPDA lesion, 40% stenosed.  Ramus lesion, 90% stenosed.  Prox Cx to Mid Cx lesion, 100% stenosed.  Ost LM lesion, 50% stenosed.  Prox LAD to Dist LAD lesion, 40% stenosed.  2nd Diag lesion, 40% stenosed.  Dist LAD lesion, 60% stenosed. 1. Triple vessel CAD  2. Moderate ostial left main stenosis.  3. Moderate mid LAD stenosis.  4. Severe mid RCA stenosis 5. Severe intermediate branch stenosis.  6. 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 The OM branch fills from left to left collaterals.  Recommendations: Will admit overnight for hydration post cath given renal insufficiency. Will ask CT surgery to see him to discuss CABG.   12/31/14 Echo: Study Conclusions - Left ventricle: The cavity size was normal. Wall thickness was normal. Systolic function was mildly to moderately reduced. The estimated ejection  fraction was in the range of 40% to 45%. There is hypokinesis of the inferolateral myocardium. Doppler parameters are consistent with abnormal left ventricular relaxation (grade 1 diastolic dysfunction). - Mitral valve: There was mild to moderate regurgitation.   02/16/15 Carotid U/S (Prelim): Findings suggest 1-39% internal carotid artery stenosis bilaterally. Vertebral arteries are patent with antegrade flow.  Preoperative chest x-ray and EKG noted.  02/16/15 PFTs: FVC 3.14 (72 %), FEV1 1.86 (55%), DLCOunc 20.54 (56%).   Preoperative labs noted. K 5.0. BUN/Cr 31/1.63, which appears to be within his range since at least 11/2014. H/H 11.3/33.8. PT/PTT WNL. A1c 8.7. He will get a fasting CBG on arrival. Renal function will need to be followed closely post-operatively.  [facility] Short Stay Center/Anesthesiology Phone [phone] 02/17/2015 10:14 AM

## 2015-02-17 NOTE — Anesthesia Preprocedure Evaluation (Addendum)
Anesthesia Evaluation  Patient identified by MRN, date of birth, ID band Patient awake    Reviewed: Allergy & Precautions, H&P , NPO status , Patient's Chart, lab work & pertinent test results, reviewed documented beta blocker date and time   Airway Mallampati: III  TM Distance: >3 FB Neck ROM: Full    Dental no notable dental hx. (+) Edentulous Upper, Partial Lower, Dental Advisory Given   Pulmonary asthma , former smoker,    Pulmonary exam normal breath sounds clear to auscultation       Cardiovascular hypertension, Pt. on medications and Pt. on home beta blockers + CAD, + Past MI and +CHF   Rhythm:Regular Rate:Normal  Echo 12/31/14: Study Conclusions  - Left ventricle: The cavity size was normal. Wall thickness was normal. Systolic function was mildly to moderately reduced. The estimated ejection fraction was in the range of 40% to 45%. There is hypokinesis of the inferolateral myocardium. Doppler parameters are consistent with abnormal left ventricular relaxation (grade 1 diastolic dysfunction). - Mitral valve: There was mild to moderate regurgitation.     Neuro/Psych Anxiety negative neurological ROS  negative psych ROS   GI/Hepatic negative GI ROS, Neg liver ROS,   Endo/Other  diabetes, Insulin Dependent  Renal/GU Renal InsufficiencyRenal disease  negative genitourinary   Musculoskeletal  (+) Arthritis ,   Abdominal   Peds  Hematology negative hematology ROS (+)   Anesthesia Other Findings   Reproductive/Obstetrics negative OB ROS                          EKG 02/09/15: Sinus rhythm with Premature atrial complexes Cannot rule out Inferior infarct , age undetermined New since previous tracing  Lab Results  Component Value Date   HGBA1C 8.7* 02/16/2015    BP Readings from Last 3 Encounters:  02/16/15 151/83  02/10/15 148/77  02/03/15 148/80   Lab Results   Component Value Date   WBC 4.2 02/16/2015   HGB 11.3* 02/16/2015   HCT 33.8* 02/16/2015   MCV 89.9 02/16/2015   PLT 316 02/16/2015     Chemistry      Component Value Date/Time   NA 138 02/16/2015 0852   NA 136 11/30/2014 1022   K 5.0 02/16/2015 0852   CL 104 02/16/2015 0852   CO2 25 02/16/2015 0852   BUN 31* 02/16/2015 0852   BUN 43* 11/30/2014 1022   CREATININE 1.63* 02/16/2015 0852   CREATININE 1.79* 02/03/2015 1012      Component Value Date/Time   CALCIUM 9.4 02/16/2015 0852   ALKPHOS 75 02/16/2015 0852   AST 26 02/16/2015 0852   ALT 25 02/16/2015 0852   BILITOT 0.3 02/16/2015 0852   BILITOT <0.2 11/30/2014 1022      Anesthesia Physical Anesthesia Plan  ASA: IV  Anesthesia Plan: General   Post-op Pain Management:    Induction: Intravenous  Airway Management Planned: Oral ETT  Additional Equipment: Arterial line, CVP, PA Cath, TEE and Ultrasound Guidance Line Placement  Intra-op Plan:   Post-operative Plan: Post-operative intubation/ventilation  Informed Consent: I have reviewed the patients History and Physical, chart, labs and discussed the procedure including the risks, benefits and alternatives for the proposed anesthesia with the patient or authorized representative who has indicated his/her understanding and acceptance.   Dental advisory given  Plan Discussed with: CRNA  Anesthesia Plan Comments:         Anesthesia Quick Evaluation

## 2015-02-18 ENCOUNTER — Inpatient Hospital Stay (HOSPITAL_COMMUNITY)
Admission: RE | Admit: 2015-02-18 | Discharge: 2015-02-22 | DRG: 236 | Disposition: A | Discharge status: Home / Self Care | Payer: Medicare Other | Source: Ambulatory Visit | Attending: Surgery | Admitting: Surgery

## 2015-02-18 ENCOUNTER — Inpatient Hospital Stay (HOSPITAL_COMMUNITY): Payer: Medicare Other | Admitting: Certified Registered"

## 2015-02-18 ENCOUNTER — Inpatient Hospital Stay (HOSPITAL_COMMUNITY): Payer: Medicare Other

## 2015-02-18 ENCOUNTER — Encounter (HOSPITAL_COMMUNITY)
Admission: RE | Disposition: A | Discharge status: Home / Self Care | Payer: Self-pay | Source: Ambulatory Visit | Attending: Surgery

## 2015-02-18 ENCOUNTER — Encounter (HOSPITAL_COMMUNITY): Payer: Self-pay | Admitting: *Deleted

## 2015-02-18 DIAGNOSIS — D62 Acute posthemorrhagic anemia: Secondary | ICD-10-CM | POA: Diagnosis not present

## 2015-02-18 DIAGNOSIS — N183 Chronic kidney disease, stage 3 (moderate): Secondary | ICD-10-CM | POA: Diagnosis not present

## 2015-02-18 DIAGNOSIS — I130 Hypertensive heart and chronic kidney disease with heart failure and stage 1 through stage 4 chronic kidney disease, or unspecified chronic kidney disease: Secondary | ICD-10-CM | POA: Diagnosis present

## 2015-02-18 DIAGNOSIS — I252 Old myocardial infarction: Secondary | ICD-10-CM

## 2015-02-18 DIAGNOSIS — J45909 Unspecified asthma, uncomplicated: Secondary | ICD-10-CM | POA: Diagnosis present

## 2015-02-18 DIAGNOSIS — I5040 Unspecified combined systolic (congestive) and diastolic (congestive) heart failure: Secondary | ICD-10-CM | POA: Diagnosis not present

## 2015-02-18 DIAGNOSIS — Z951 Presence of aortocoronary bypass graft: Secondary | ICD-10-CM

## 2015-02-18 DIAGNOSIS — Z794 Long term (current) use of insulin: Secondary | ICD-10-CM | POA: Diagnosis not present

## 2015-02-18 DIAGNOSIS — F419 Anxiety disorder, unspecified: Secondary | ICD-10-CM | POA: Diagnosis present

## 2015-02-18 DIAGNOSIS — Z6832 Body mass index (BMI) 32.0-32.9, adult: Secondary | ICD-10-CM | POA: Diagnosis not present

## 2015-02-18 DIAGNOSIS — Z87891 Personal history of nicotine dependence: Secondary | ICD-10-CM

## 2015-02-18 DIAGNOSIS — I080 Rheumatic disorders of both mitral and aortic valves: Secondary | ICD-10-CM | POA: Diagnosis not present

## 2015-02-18 DIAGNOSIS — I2510 Atherosclerotic heart disease of native coronary artery without angina pectoris: Secondary | ICD-10-CM | POA: Diagnosis not present

## 2015-02-18 DIAGNOSIS — Z7982 Long term (current) use of aspirin: Secondary | ICD-10-CM | POA: Diagnosis not present

## 2015-02-18 DIAGNOSIS — K5900 Constipation, unspecified: Secondary | ICD-10-CM | POA: Diagnosis not present

## 2015-02-18 DIAGNOSIS — I158 Other secondary hypertension: Secondary | ICD-10-CM

## 2015-02-18 DIAGNOSIS — I5042 Chronic combined systolic (congestive) and diastolic (congestive) heart failure: Secondary | ICD-10-CM | POA: Diagnosis present

## 2015-02-18 DIAGNOSIS — J9811 Atelectasis: Secondary | ICD-10-CM | POA: Diagnosis not present

## 2015-02-18 DIAGNOSIS — E785 Hyperlipidemia, unspecified: Secondary | ICD-10-CM | POA: Diagnosis present

## 2015-02-18 DIAGNOSIS — E875 Hyperkalemia: Secondary | ICD-10-CM | POA: Diagnosis not present

## 2015-02-18 DIAGNOSIS — E1165 Type 2 diabetes mellitus with hyperglycemia: Secondary | ICD-10-CM | POA: Diagnosis present

## 2015-02-18 DIAGNOSIS — J90 Pleural effusion, not elsewhere classified: Secondary | ICD-10-CM | POA: Diagnosis not present

## 2015-02-18 HISTORY — PX: CORONARY ARTERY BYPASS GRAFT: SHX141

## 2015-02-18 HISTORY — DX: Atherosclerotic heart disease of native coronary artery without angina pectoris: I25.10

## 2015-02-18 HISTORY — PX: TEE WITHOUT CARDIOVERSION: SHX5443

## 2015-02-18 LAB — POCT I-STAT 3, ART BLOOD GAS (G3+)
ACID-BASE DEFICIT: 4 mmol/L — AB (ref 0.0–2.0)
ACID-BASE DEFICIT: 3 mmol/L — AB (ref 0.0–2.0)
ACID-BASE EXCESS: 5 mmol/L — AB (ref 0.0–2.0)
Acid-base deficit: 1 mmol/L (ref 0.0–2.0)
BICARBONATE: 30 meq/L — AB (ref 20.0–24.0)
BICARBONATE: 25.7 meq/L — AB (ref 20.0–24.0)
BICARBONATE: 22.2 meq/L (ref 20.0–24.0)
Bicarbonate: 24.6 mEq/L — ABNORMAL HIGH (ref 20.0–24.0)
Bicarbonate: 23.9 mEq/L (ref 20.0–24.0)
Bicarbonate: 22.7 mEq/L (ref 20.0–24.0)
O2 SAT: 100 %
O2 SAT: 100 %
O2 SAT: 99 %
O2 SAT: 97 %
O2 SAT: 96 %
O2 Saturation: 100 %
PCO2 ART: 36.3 mmHg (ref 35.0–45.0)
PCO2 ART: 41.2 mmHg (ref 35.0–45.0)
PH ART: 7.422 (ref 7.350–7.450)
PH ART: 7.31 — AB (ref 7.350–7.450)
PH ART: 7.373 (ref 7.350–7.450)
PO2 ART: 191 mmHg — AB (ref 80.0–100.0)
PO2 ART: 132 mmHg — AB (ref 80.0–100.0)
Patient temperature: 37
TCO2: 31 mmol/L (ref 0–100)
TCO2: 26 mmol/L (ref 0–100)
TCO2: 27 mmol/L (ref 0–100)
TCO2: 25 mmol/L (ref 0–100)
TCO2: 23 mmol/L (ref 0–100)
TCO2: 24 mmol/L (ref 0–100)
pCO2 arterial: 46 mmHg — ABNORMAL HIGH (ref 35.0–45.0)
pCO2 arterial: 51 mmHg — ABNORMAL HIGH (ref 35.0–45.0)
pCO2 arterial: 43.1 mmHg (ref 35.0–45.0)
pCO2 arterial: 44.4 mmHg (ref 35.0–45.0)
pH, Arterial: 7.439 (ref 7.350–7.450)
pH, Arterial: 7.32 — ABNORMAL LOW (ref 7.350–7.450)
pH, Arterial: 7.315 — ABNORMAL LOW (ref 7.350–7.450)
pO2, Arterial: 390 mmHg — ABNORMAL HIGH (ref 80.0–100.0)
pO2, Arterial: 423 mmHg — ABNORMAL HIGH (ref 80.0–100.0)
pO2, Arterial: 105 mmHg — ABNORMAL HIGH (ref 80.0–100.0)
pO2, Arterial: 91 mmHg (ref 80.0–100.0)

## 2015-02-18 LAB — POCT I-STAT, CHEM 8
BUN: 30 mg/dL — ABNORMAL HIGH (ref 6–20)
BUN: 32 mg/dL — ABNORMAL HIGH (ref 6–20)
BUN: 34 mg/dL — ABNORMAL HIGH (ref 6–20)
BUN: 31 mg/dL — AB (ref 6–20)
BUN: 31 mg/dL — AB (ref 6–20)
BUN: 34 mg/dL — ABNORMAL HIGH (ref 6–20)
BUN: 32 mg/dL — ABNORMAL HIGH (ref 6–20)
CALCIUM ION: 1.27 mmol/L (ref 1.13–1.30)
CALCIUM ION: 1.28 mmol/L (ref 1.13–1.30)
CALCIUM ION: 1.29 mmol/L (ref 1.13–1.30)
CALCIUM ION: 1.14 mmol/L (ref 1.13–1.30)
CHLORIDE: 105 mmol/L (ref 101–111)
CHLORIDE: 102 mmol/L (ref 101–111)
CHLORIDE: 106 mmol/L (ref 101–111)
CREATININE: 1.7 mg/dL — AB (ref 0.61–1.24)
CREATININE: 1.9 mg/dL — AB (ref 0.61–1.24)
CREATININE: 1.9 mg/dL — AB (ref 0.61–1.24)
Calcium, Ion: 1.2 mmol/L (ref 1.13–1.30)
Calcium, Ion: 1.22 mmol/L (ref 1.13–1.30)
Calcium, Ion: 1.17 mmol/L (ref 1.13–1.30)
Chloride: 104 mmol/L (ref 101–111)
Chloride: 105 mmol/L (ref 101–111)
Chloride: 107 mmol/L (ref 101–111)
Chloride: 118 mmol/L — ABNORMAL HIGH (ref 101–111)
Creatinine, Ser: 1.8 mg/dL — ABNORMAL HIGH (ref 0.61–1.24)
Creatinine, Ser: 1.6 mg/dL — ABNORMAL HIGH (ref 0.61–1.24)
Creatinine, Ser: 1.8 mg/dL — ABNORMAL HIGH (ref 0.61–1.24)
Creatinine, Ser: 2 mg/dL — ABNORMAL HIGH (ref 0.61–1.24)
GLUCOSE: 144 mg/dL — AB (ref 65–99)
GLUCOSE: 142 mg/dL — AB (ref 65–99)
GLUCOSE: 150 mg/dL — AB (ref 65–99)
GLUCOSE: 120 mg/dL — AB (ref 65–99)
Glucose, Bld: 142 mg/dL — ABNORMAL HIGH (ref 65–99)
Glucose, Bld: 185 mg/dL — ABNORMAL HIGH (ref 65–99)
Glucose, Bld: 149 mg/dL — ABNORMAL HIGH (ref 65–99)
HCT: 32 % — ABNORMAL LOW (ref 39.0–52.0)
HCT: 26 % — ABNORMAL LOW (ref 39.0–52.0)
HCT: 27 % — ABNORMAL LOW (ref 39.0–52.0)
HEMATOCRIT: 23 % — AB (ref 39.0–52.0)
HEMATOCRIT: 22 % — AB (ref 39.0–52.0)
HEMATOCRIT: 25 % — AB (ref 39.0–52.0)
HEMATOCRIT: 29 % — AB (ref 39.0–52.0)
HEMOGLOBIN: 8.8 g/dL — AB (ref 13.0–17.0)
HEMOGLOBIN: 9.2 g/dL — AB (ref 13.0–17.0)
HEMOGLOBIN: 7.8 g/dL — AB (ref 13.0–17.0)
HEMOGLOBIN: 7.5 g/dL — AB (ref 13.0–17.0)
HEMOGLOBIN: 8.5 g/dL — AB (ref 13.0–17.0)
HEMOGLOBIN: 9.9 g/dL — AB (ref 13.0–17.0)
Hemoglobin: 10.9 g/dL — ABNORMAL LOW (ref 13.0–17.0)
POTASSIUM: 4.6 mmol/L (ref 3.5–5.1)
POTASSIUM: 5.1 mmol/L (ref 3.5–5.1)
POTASSIUM: 5 mmol/L (ref 3.5–5.1)
POTASSIUM: 5.6 mmol/L — AB (ref 3.5–5.1)
Potassium: 4.7 mmol/L (ref 3.5–5.1)
Potassium: 4.8 mmol/L (ref 3.5–5.1)
Potassium: 4.4 mmol/L (ref 3.5–5.1)
SODIUM: 139 mmol/L (ref 135–145)
SODIUM: 142 mmol/L (ref 135–145)
SODIUM: 137 mmol/L (ref 135–145)
Sodium: 139 mmol/L (ref 135–145)
Sodium: 138 mmol/L (ref 135–145)
Sodium: 141 mmol/L (ref 135–145)
Sodium: 136 mmol/L (ref 135–145)
TCO2: 27 mmol/L (ref 0–100)
TCO2: 27 mmol/L (ref 0–100)
TCO2: 28 mmol/L (ref 0–100)
TCO2: 28 mmol/L (ref 0–100)
TCO2: 31 mmol/L (ref 0–100)
TCO2: 25 mmol/L (ref 0–100)
TCO2: 23 mmol/L (ref 0–100)

## 2015-02-18 LAB — POCT I-STAT 4, (NA,K, GLUC, HGB,HCT)
GLUCOSE: 99 mg/dL (ref 65–99)
HEMATOCRIT: 29 % — AB (ref 39.0–52.0)
HEMOGLOBIN: 9.9 g/dL — AB (ref 13.0–17.0)
Potassium: 4.7 mmol/L (ref 3.5–5.1)
SODIUM: 138 mmol/L (ref 135–145)

## 2015-02-18 LAB — HEMOGLOBIN AND HEMATOCRIT, BLOOD
HEMATOCRIT: 23.7 % — AB (ref 39.0–52.0)
Hemoglobin: 8.2 g/dL — ABNORMAL LOW (ref 13.0–17.0)

## 2015-02-18 LAB — CBC
HCT: 30.6 % — ABNORMAL LOW (ref 39.0–52.0)
HEMATOCRIT: 29.4 % — AB (ref 39.0–52.0)
HEMOGLOBIN: 9.9 g/dL — AB (ref 13.0–17.0)
Hemoglobin: 10.4 g/dL — ABNORMAL LOW (ref 13.0–17.0)
MCH: 30.1 pg (ref 26.0–34.0)
MCH: 30.1 pg (ref 26.0–34.0)
MCHC: 33.7 g/dL (ref 30.0–36.0)
MCHC: 34 g/dL (ref 30.0–36.0)
MCV: 89.4 fL (ref 78.0–100.0)
MCV: 88.7 fL (ref 78.0–100.0)
PLATELETS: 216 10*3/uL (ref 150–400)
Platelets: 193 10*3/uL (ref 150–400)
RBC: 3.29 MIL/uL — AB (ref 4.22–5.81)
RBC: 3.45 MIL/uL — ABNORMAL LOW (ref 4.22–5.81)
RDW: 12.2 % (ref 11.5–15.5)
RDW: 12.7 % (ref 11.5–15.5)
WBC: 11.9 10*3/uL — ABNORMAL HIGH (ref 4.0–10.5)
WBC: 12.5 10*3/uL — AB (ref 4.0–10.5)

## 2015-02-18 LAB — GLUCOSE, CAPILLARY
GLUCOSE-CAPILLARY: 155 mg/dL — AB (ref 65–99)
GLUCOSE-CAPILLARY: 85 mg/dL (ref 65–99)
GLUCOSE-CAPILLARY: 102 mg/dL — AB (ref 65–99)
GLUCOSE-CAPILLARY: 105 mg/dL — AB (ref 65–99)
GLUCOSE-CAPILLARY: 136 mg/dL — AB (ref 65–99)
GLUCOSE-CAPILLARY: 126 mg/dL — AB (ref 65–99)
Glucose-Capillary: 77 mg/dL (ref 65–99)
Glucose-Capillary: 99 mg/dL (ref 65–99)
Glucose-Capillary: 135 mg/dL — ABNORMAL HIGH (ref 65–99)

## 2015-02-18 LAB — PROTIME-INR
INR: 1.44 (ref 0.00–1.49)
PROTHROMBIN TIME: 17.7 s — AB (ref 11.6–15.2)

## 2015-02-18 LAB — APTT: aPTT: 34 seconds (ref 24–37)

## 2015-02-18 LAB — CREATININE, SERUM
Creatinine, Ser: 2.13 mg/dL — ABNORMAL HIGH (ref 0.61–1.24)
GFR calc non Af Amer: 31 mL/min — ABNORMAL LOW (ref >60)
GFR, EST AFRICAN AMERICAN: 35 mL/min — AB (ref >60)

## 2015-02-18 LAB — MAGNESIUM: Magnesium: 3.5 mg/dL — ABNORMAL HIGH (ref 1.7–2.4)

## 2015-02-18 LAB — PLATELET COUNT: PLATELETS: 224 10*3/uL (ref 150–400)

## 2015-02-18 LAB — PREPARE RBC (CROSSMATCH)

## 2015-02-18 SURGERY — CORONARY ARTERY BYPASS GRAFTING (CABG)
Anesthesia: General | Site: Chest

## 2015-02-18 MED ORDER — PANTOPRAZOLE SODIUM 40 MG PO TBEC
40.0000 mg | DELAYED_RELEASE_TABLET | Freq: Every day | ORAL | Status: DC
  Administered 2015-02-20 – 2015-02-22 (×3): 40 mg via ORAL
  Filled 2015-02-18 (×3): qty 1

## 2015-02-18 MED ORDER — PHENYLEPHRINE HCL 10 MG/ML IJ SOLN
INTRAMUSCULAR | Status: AC
  Filled 2015-02-18: qty 1

## 2015-02-18 MED ORDER — ROCURONIUM BROMIDE 50 MG/5ML IV SOLN
INTRAVENOUS | Status: AC
Start: 2015-02-18 — End: 2015-02-18
  Filled 2015-02-18: qty 2

## 2015-02-18 MED ORDER — DEXMEDETOMIDINE HCL IN NACL 200 MCG/50ML IV SOLN
0.0000 ug/kg/h | INTRAVENOUS | Status: DC
Start: 2015-02-18 — End: 2015-02-19

## 2015-02-18 MED ORDER — SUCCINYLCHOLINE CHLORIDE 20 MG/ML IJ SOLN
INTRAMUSCULAR | Status: AC
Start: 2015-02-18 — End: 2015-02-18
  Filled 2015-02-18: qty 1

## 2015-02-18 MED ORDER — MIDAZOLAM HCL 5 MG/5ML IJ SOLN
INTRAMUSCULAR | Status: DC | PRN
  Administered 2015-02-18: 2 mg via INTRAVENOUS
  Administered 2015-02-18: 1 mg via INTRAVENOUS
  Administered 2015-02-18 (×3): 2 mg via INTRAVENOUS
  Administered 2015-02-18: 4 mg via INTRAVENOUS
  Administered 2015-02-18: 2 mg via INTRAVENOUS
  Administered 2015-02-18: 1 mg via INTRAVENOUS

## 2015-02-18 MED ORDER — 0.9 % SODIUM CHLORIDE (POUR BTL) OPTIME
TOPICAL | Status: DC | PRN
  Administered 2015-02-18: 6000 mL

## 2015-02-18 MED ORDER — DEXMEDETOMIDINE HCL IN NACL 200 MCG/50ML IV SOLN
INTRAVENOUS | Status: AC
  Filled 2015-02-18: qty 50

## 2015-02-18 MED ORDER — ARTIFICIAL TEARS OP OINT
TOPICAL_OINTMENT | OPHTHALMIC | Status: DC | PRN
  Administered 2015-02-18: 1 via OPHTHALMIC

## 2015-02-18 MED ORDER — EPHEDRINE SULFATE 50 MG/ML IJ SOLN
INTRAMUSCULAR | Status: AC
  Filled 2015-02-18: qty 1

## 2015-02-18 MED ORDER — PROTAMINE SULFATE 10 MG/ML IV SOLN
INTRAVENOUS | Status: AC
  Filled 2015-02-18: qty 50

## 2015-02-18 MED ORDER — ASPIRIN EC 325 MG PO TBEC
325.0000 mg | DELAYED_RELEASE_TABLET | Freq: Every day | ORAL | Status: DC
  Administered 2015-02-19 – 2015-02-22 (×4): 325 mg via ORAL
  Filled 2015-02-18 (×4): qty 1

## 2015-02-18 MED ORDER — SODIUM CHLORIDE 0.9 % IJ SOLN
3.0000 mL | Freq: Two times a day (BID) | INTRAMUSCULAR | Status: DC
  Administered 2015-02-19 – 2015-02-22 (×7): 3 mL via INTRAVENOUS

## 2015-02-18 MED ORDER — MOMETASONE FURO-FORMOTEROL FUM 100-5 MCG/ACT IN AERO
2.0000 | INHALATION_SPRAY | Freq: Two times a day (BID) | RESPIRATORY_TRACT | Status: DC
  Administered 2015-02-19 – 2015-02-22 (×6): 2 via RESPIRATORY_TRACT
  Filled 2015-02-18: qty 8.8

## 2015-02-18 MED ORDER — HEPARIN SODIUM (PORCINE) 1000 UNIT/ML IJ SOLN
INTRAMUSCULAR | Status: AC
  Filled 2015-02-18: qty 2

## 2015-02-18 MED ORDER — DOCUSATE SODIUM 100 MG PO CAPS
200.0000 mg | ORAL_CAPSULE | Freq: Every day | ORAL | Status: DC
  Administered 2015-02-19 – 2015-02-22 (×4): 200 mg via ORAL
  Filled 2015-02-18 (×4): qty 2

## 2015-02-18 MED ORDER — VECURONIUM BROMIDE 10 MG IV SOLR
INTRAVENOUS | Status: DC | PRN
Start: 2015-02-18 — End: 2015-02-18
  Administered 2015-02-18 (×5): 5 mg via INTRAVENOUS

## 2015-02-18 MED ORDER — THROMBIN 20000 UNITS EX SOLR
CUTANEOUS | Status: AC
  Filled 2015-02-18: qty 20000

## 2015-02-18 MED ORDER — LACTATED RINGERS IV SOLN
INTRAVENOUS | Status: DC | PRN
  Administered 2015-02-18: 07:00:00 via INTRAVENOUS

## 2015-02-18 MED ORDER — INSULIN REGULAR BOLUS VIA INFUSION
0.0000 [IU] | Freq: Three times a day (TID) | INTRAVENOUS | Status: DC
  Administered 2015-02-19: 0.4 [IU]/h via INTRAVENOUS
  Filled 2015-02-18: qty 10

## 2015-02-18 MED ORDER — GLYCOPYRROLATE 0.2 MG/ML IJ SOLN
INTRAMUSCULAR | Status: AC
Start: 2015-02-18 — End: 2015-02-18
  Filled 2015-02-18: qty 1

## 2015-02-18 MED ORDER — HEMOSTATIC AGENTS (NO CHARGE) OPTIME
TOPICAL | Status: DC | PRN
  Administered 2015-02-18 (×2): 1 via TOPICAL

## 2015-02-18 MED ORDER — ACETAMINOPHEN 650 MG RE SUPP
650.0000 mg | Freq: Once | RECTAL | Status: AC
  Administered 2015-02-18: 650 mg via RECTAL

## 2015-02-18 MED ORDER — BISACODYL 5 MG PO TBEC
10.0000 mg | DELAYED_RELEASE_TABLET | Freq: Every day | ORAL | Status: DC
  Administered 2015-02-19 – 2015-02-21 (×3): 10 mg via ORAL
  Filled 2015-02-18 (×4): qty 2

## 2015-02-18 MED ORDER — SODIUM CHLORIDE 0.9 % IV SOLN
INTRAVENOUS | Status: DC | PRN
  Administered 2015-02-18 (×2): via INTRAVENOUS

## 2015-02-18 MED ORDER — MORPHINE SULFATE (PF) 2 MG/ML IV SOLN
2.0000 mg | INTRAVENOUS | Status: DC | PRN
  Administered 2015-02-18 – 2015-02-19 (×5): 2 mg via INTRAVENOUS
  Filled 2015-02-18 (×5): qty 1

## 2015-02-18 MED ORDER — THROMBIN 20000 UNITS EX SOLR
CUTANEOUS | Status: DC | PRN
  Administered 2015-02-18: 20000 [IU] via TOPICAL

## 2015-02-18 MED ORDER — LACTATED RINGERS IV SOLN: INTRAVENOUS | Status: DC

## 2015-02-18 MED ORDER — MIDAZOLAM HCL 2 MG/2ML IJ SOLN: 2.0000 mg | INTRAMUSCULAR | Status: DC | PRN

## 2015-02-18 MED ORDER — SODIUM CHLORIDE 0.9 % IV SOLN
Freq: Once | INTRAVENOUS | Status: DC
Start: 2015-02-18 — End: 2015-02-18

## 2015-02-18 MED ORDER — MORPHINE SULFATE (PF) 2 MG/ML IV SOLN: 1.0000 mg | INTRAVENOUS | Status: AC | PRN

## 2015-02-18 MED ORDER — LIDOCAINE HCL (CARDIAC) 20 MG/ML IV SOLN
INTRAVENOUS | Status: AC
  Filled 2015-02-18: qty 5

## 2015-02-18 MED ORDER — METOPROLOL TARTRATE 12.5 MG HALF TABLET
12.5000 mg | ORAL_TABLET | Freq: Two times a day (BID) | ORAL | Status: DC
  Administered 2015-02-19 – 2015-02-20 (×3): 12.5 mg via ORAL
  Filled 2015-02-18 (×3): qty 1

## 2015-02-18 MED ORDER — LACTATED RINGERS IV SOLN
INTRAVENOUS | Status: DC | PRN
  Administered 2015-02-18 (×2): via INTRAVENOUS

## 2015-02-18 MED ORDER — ALBUMIN HUMAN 5 % IV SOLN
INTRAVENOUS | Status: DC | PRN
  Administered 2015-02-18 (×2): via INTRAVENOUS

## 2015-02-18 MED ORDER — ONDANSETRON HCL 4 MG/2ML IJ SOLN: 4.0000 mg | Freq: Four times a day (QID) | INTRAMUSCULAR | Status: DC | PRN

## 2015-02-18 MED ORDER — POTASSIUM CHLORIDE 10 MEQ/50ML IV SOLN: 10.0000 meq | INTRAVENOUS | Status: AC

## 2015-02-18 MED ORDER — TRAMADOL HCL 50 MG PO TABS
50.0000 mg | ORAL_TABLET | ORAL | Status: DC | PRN
  Administered 2015-02-19 – 2015-02-20 (×3): 100 mg via ORAL
  Filled 2015-02-18 (×3): qty 2

## 2015-02-18 MED ORDER — FENTANYL CITRATE (PF) 100 MCG/2ML IJ SOLN
INTRAMUSCULAR | Status: DC | PRN
  Administered 2015-02-18 (×2): 250 ug via INTRAVENOUS
  Administered 2015-02-18: 25 ug via INTRAVENOUS
  Administered 2015-02-18: 250 ug via INTRAVENOUS
  Administered 2015-02-18: 100 ug via INTRAVENOUS
  Administered 2015-02-18: 450 ug via INTRAVENOUS
  Administered 2015-02-18: 25 ug via INTRAVENOUS

## 2015-02-18 MED ORDER — METOPROLOL TARTRATE 25 MG/10 ML ORAL SUSPENSION: 12.5000 mg | Freq: Two times a day (BID) | ORAL | Status: DC

## 2015-02-18 MED ORDER — DOPAMINE-DEXTROSE 3.2-5 MG/ML-% IV SOLN: 0.0000 ug/kg/min | INTRAVENOUS | Status: DC

## 2015-02-18 MED ORDER — NITROGLYCERIN 0.2 MG/ML ON CALL CATH LAB
INTRAVENOUS | Status: DC | PRN
  Administered 2015-02-18: 200 ug via INTRAVENOUS
  Administered 2015-02-18: 160 ug via INTRAVENOUS
  Administered 2015-02-18 (×3): 80 ug via INTRAVENOUS

## 2015-02-18 MED ORDER — ACETAMINOPHEN 160 MG/5ML PO SOLN: 650.0000 mg | Freq: Once | ORAL | Status: AC

## 2015-02-18 MED ORDER — PROPOFOL 10 MG/ML IV BOLUS
INTRAVENOUS | Status: DC | PRN
  Administered 2015-02-18: 50 mg via INTRAVENOUS

## 2015-02-18 MED ORDER — PROPOFOL 10 MG/ML IV BOLUS
INTRAVENOUS | Status: AC
  Filled 2015-02-18: qty 20

## 2015-02-18 MED ORDER — ATORVASTATIN CALCIUM 40 MG PO TABS
40.0000 mg | ORAL_TABLET | Freq: Every evening | ORAL | Status: DC
  Administered 2015-02-19 – 2015-02-21 (×3): 40 mg via ORAL
  Filled 2015-02-18 (×3): qty 1

## 2015-02-18 MED ORDER — ACETAMINOPHEN 500 MG PO TABS
1000.0000 mg | ORAL_TABLET | Freq: Four times a day (QID) | ORAL | Status: DC
  Administered 2015-02-19 – 2015-02-22 (×13): 1000 mg via ORAL
  Filled 2015-02-18 (×13): qty 2

## 2015-02-18 MED ORDER — LACTATED RINGERS IV SOLN
500.0000 mL | Freq: Once | INTRAVENOUS | Status: DC | PRN
Start: 2015-02-18 — End: 2015-02-19

## 2015-02-18 MED ORDER — HEPARIN SODIUM (PORCINE) 1000 UNIT/ML IJ SOLN
INTRAMUSCULAR | Status: DC | PRN
  Administered 2015-02-18: 50000 [IU] via INTRAVENOUS

## 2015-02-18 MED ORDER — VANCOMYCIN HCL IN DEXTROSE 1-5 GM/200ML-% IV SOLN
1000.0000 mg | Freq: Once | INTRAVENOUS | Status: AC
  Administered 2015-02-18: 1000 mg via INTRAVENOUS
  Filled 2015-02-18: qty 200

## 2015-02-18 MED ORDER — ASPIRIN 81 MG PO CHEW
324.0000 mg | CHEWABLE_TABLET | Freq: Every day | ORAL | Status: DC
Start: 2015-02-19 — End: 2015-02-19

## 2015-02-18 MED ORDER — VECURONIUM BROMIDE 10 MG IV SOLR
INTRAVENOUS | Status: AC
Start: 2015-02-18 — End: 2015-02-18
  Filled 2015-02-18: qty 10

## 2015-02-18 MED ORDER — MIDAZOLAM HCL 2 MG/2ML IJ SOLN
INTRAMUSCULAR | Status: AC
  Filled 2015-02-18: qty 2

## 2015-02-18 MED ORDER — ARTIFICIAL TEARS OP OINT
TOPICAL_OINTMENT | OPHTHALMIC | Status: AC
Start: 2015-02-18 — End: 2015-02-18
  Filled 2015-02-18: qty 3.5

## 2015-02-18 MED ORDER — ALBUMIN HUMAN 5 % IV SOLN
250.0000 mL | INTRAVENOUS | Status: AC | PRN
Start: 2015-02-18 — End: 2015-02-19
  Administered 2015-02-18 (×2): 250 mL via INTRAVENOUS

## 2015-02-18 MED ORDER — ROCURONIUM BROMIDE 100 MG/10ML IV SOLN
INTRAVENOUS | Status: DC | PRN
  Administered 2015-02-18: 100 mg via INTRAVENOUS

## 2015-02-18 MED ORDER — PHENYLEPHRINE 40 MCG/ML (10ML) SYRINGE FOR IV PUSH (FOR BLOOD PRESSURE SUPPORT)
PREFILLED_SYRINGE | INTRAVENOUS | Status: AC
  Filled 2015-02-18: qty 10

## 2015-02-18 MED ORDER — MAGNESIUM SULFATE 4 GM/100ML IV SOLN
4.0000 g | Freq: Once | INTRAVENOUS | Status: AC
  Administered 2015-02-18: 4 g via INTRAVENOUS
  Filled 2015-02-18: qty 100

## 2015-02-18 MED ORDER — PHENYLEPHRINE 40 MCG/ML (10ML) SYRINGE FOR IV PUSH (FOR BLOOD PRESSURE SUPPORT)
PREFILLED_SYRINGE | INTRAVENOUS | Status: AC
  Filled 2015-02-18: qty 20

## 2015-02-18 MED ORDER — SODIUM CHLORIDE 0.45 % IV SOLN: INTRAVENOUS | Status: DC | PRN

## 2015-02-18 MED ORDER — SODIUM CHLORIDE 0.9 % IV SOLN
INTRAVENOUS | Status: DC
  Administered 2015-02-18: 20:00:00 via INTRAVENOUS
  Filled 2015-02-18: qty 2.5

## 2015-02-18 MED ORDER — NITROGLYCERIN IN D5W 200-5 MCG/ML-% IV SOLN: 0.0000 ug/min | INTRAVENOUS | Status: DC

## 2015-02-18 MED ORDER — DEXTROSE 5 % IV SOLN
10.0000 mg | INTRAVENOUS | Status: DC | PRN
  Administered 2015-02-18: 20 ug/min via INTRAVENOUS

## 2015-02-18 MED ORDER — MONTELUKAST SODIUM 10 MG PO TABS
10.0000 mg | ORAL_TABLET | Freq: Every day | ORAL | Status: DC
  Administered 2015-02-19 – 2015-02-21 (×3): 10 mg via ORAL
  Filled 2015-02-18 (×3): qty 1

## 2015-02-18 MED ORDER — DEXTROSE 5 % IV SOLN
1.5000 g | Freq: Two times a day (BID) | INTRAVENOUS | Status: AC
  Administered 2015-02-19 – 2015-02-20 (×4): 1.5 g via INTRAVENOUS
  Filled 2015-02-18 (×4): qty 1.5

## 2015-02-18 MED ORDER — METOPROLOL TARTRATE 1 MG/ML IV SOLN: 2.5000 mg | INTRAVENOUS | Status: DC | PRN

## 2015-02-18 MED ORDER — SODIUM CHLORIDE 0.9 % IJ SOLN: 3.0000 mL | INTRAMUSCULAR | Status: DC | PRN

## 2015-02-18 MED ORDER — LACTATED RINGERS IV SOLN
INTRAVENOUS | Status: DC
Start: 2015-02-18 — End: 2015-02-19
  Administered 2015-02-18: 10 mL/h via INTRAVENOUS

## 2015-02-18 MED ORDER — MIDAZOLAM HCL 10 MG/2ML IJ SOLN
INTRAMUSCULAR | Status: AC
Start: 2015-02-18 — End: 2015-02-18
  Filled 2015-02-18: qty 2

## 2015-02-18 MED ORDER — VECURONIUM BROMIDE 10 MG IV SOLR
INTRAVENOUS | Status: AC
  Filled 2015-02-18: qty 10

## 2015-02-18 MED ORDER — FAMOTIDINE IN NACL 20-0.9 MG/50ML-% IV SOLN
20.0000 mg | Freq: Two times a day (BID) | INTRAVENOUS | Status: DC
  Administered 2015-02-18: 20 mg via INTRAVENOUS

## 2015-02-18 MED ORDER — PHENYLEPHRINE HCL 10 MG/ML IJ SOLN
INTRAMUSCULAR | Status: DC | PRN
  Administered 2015-02-18: 80 ug via INTRAVENOUS
  Administered 2015-02-18 (×3): 120 ug via INTRAVENOUS
  Administered 2015-02-18 (×2): 100 ug via INTRAVENOUS
  Administered 2015-02-18: 140 ug via INTRAVENOUS
  Administered 2015-02-18: 160 ug via INTRAVENOUS

## 2015-02-18 MED ORDER — FENTANYL CITRATE (PF) 250 MCG/5ML IJ SOLN
INTRAMUSCULAR | Status: AC
  Filled 2015-02-18: qty 10

## 2015-02-18 MED ORDER — ARTIFICIAL TEARS OP OINT
TOPICAL_OINTMENT | OPHTHALMIC | Status: AC
  Filled 2015-02-18: qty 3.5

## 2015-02-18 MED ORDER — PHENYLEPHRINE HCL 10 MG/ML IJ SOLN
0.0000 ug/min | INTRAVENOUS | Status: DC
  Administered 2015-02-18: 50 ug/min via INTRAVENOUS
  Filled 2015-02-18 (×2): qty 2

## 2015-02-18 MED ORDER — PROTAMINE SULFATE 10 MG/ML IV SOLN
INTRAVENOUS | Status: DC | PRN
  Administered 2015-02-18: 50 mg via INTRAVENOUS
  Administered 2015-02-18: 30 mg via INTRAVENOUS
  Administered 2015-02-18 (×3): 50 mg via INTRAVENOUS
  Administered 2015-02-18: 20 mg via INTRAVENOUS
  Administered 2015-02-18 (×5): 50 mg via INTRAVENOUS

## 2015-02-18 MED ORDER — BISACODYL 10 MG RE SUPP
10.0000 mg | Freq: Every day | RECTAL | Status: DC
Start: 2015-02-19 — End: 2015-02-22

## 2015-02-18 MED ORDER — SODIUM CHLORIDE 0.9 % IV SOLN
INTRAVENOUS | Status: DC
  Administered 2015-02-18: 20 mL/h via INTRAVENOUS

## 2015-02-18 MED ORDER — ACETAMINOPHEN 160 MG/5ML PO SOLN: 1000.0000 mg | Freq: Four times a day (QID) | ORAL | Status: DC

## 2015-02-18 MED ORDER — SODIUM CHLORIDE 0.9 % IV SOLN: 250.0000 mL | INTRAVENOUS | Status: DC

## 2015-02-18 MED ORDER — CHLORHEXIDINE GLUCONATE 0.12 % MT SOLN
15.0000 mL | OROMUCOSAL | Status: AC
  Administered 2015-02-18: 15 mL via OROMUCOSAL
  Filled 2015-02-18: qty 15

## 2015-02-18 MED ORDER — FENTANYL CITRATE (PF) 250 MCG/5ML IJ SOLN
INTRAMUSCULAR | Status: AC
  Filled 2015-02-18: qty 25

## 2015-02-18 MED ORDER — OXYCODONE HCL 5 MG PO TABS
5.0000 mg | ORAL_TABLET | ORAL | Status: DC | PRN
  Administered 2015-02-19 – 2015-02-21 (×6): 10 mg via ORAL
  Administered 2015-02-22: 5 mg via ORAL
  Administered 2015-02-22: 10 mg via ORAL
  Filled 2015-02-18 (×2): qty 2
  Filled 2015-02-18: qty 1
  Filled 2015-02-18: qty 2
  Filled 2015-02-18: qty 1
  Filled 2015-02-18 (×4): qty 2

## 2015-02-18 MED ORDER — HEPARIN SODIUM (PORCINE) 1000 UNIT/ML IJ SOLN
INTRAMUSCULAR | Status: AC
  Filled 2015-02-18: qty 1

## 2015-02-18 SURGICAL SUPPLY — 117 items
BAG DECANTER FOR FLEXI CONT (MISCELLANEOUS) ×4 IMPLANT
BANDAGE ACE 4X5 VEL STRL LF (GAUZE/BANDAGES/DRESSINGS) ×2 IMPLANT
BANDAGE ACE 6X5 VEL STRL LF (GAUZE/BANDAGES/DRESSINGS) ×2 IMPLANT
BANDAGE ELASTIC 4 VELCRO ST LF (GAUZE/BANDAGES/DRESSINGS) ×4 IMPLANT
BANDAGE ELASTIC 6 VELCRO ST LF (GAUZE/BANDAGES/DRESSINGS) ×4 IMPLANT
BASKET HEART  (ORDER IN 25'S) (MISCELLANEOUS) ×1
BASKET HEART (ORDER IN 25'S) (MISCELLANEOUS) ×1
BASKET HEART (ORDER IN 25S) (MISCELLANEOUS) ×2 IMPLANT
BLADE STERNUM SYSTEM 6 (BLADE) ×4 IMPLANT
BLADE SURG 11 STRL SS (BLADE) ×2 IMPLANT
BNDG GAUZE ELAST 4 BULKY (GAUZE/BANDAGES/DRESSINGS) ×4 IMPLANT
CANISTER SUCTION 2500CC (MISCELLANEOUS) ×4 IMPLANT
CATH ROBINSON RED A/P 18FR (CATHETERS) ×8 IMPLANT
CATH THORACIC 28FR (CATHETERS) ×4 IMPLANT
CATH THORACIC 36FR (CATHETERS) ×4 IMPLANT
CATH THORACIC 36FR RT ANG (CATHETERS) ×4 IMPLANT
CLIP FOGARTY SPRING 6M (CLIP) ×2 IMPLANT
CLIP TI MEDIUM 24 (CLIP) IMPLANT
CLIP TI WIDE RED SMALL 24 (CLIP) ×10 IMPLANT
COVER SURGICAL LIGHT HANDLE (MISCELLANEOUS) ×4 IMPLANT
CRADLE DONUT ADULT HEAD (MISCELLANEOUS) ×4 IMPLANT
DRAPE CARDIOVASCULAR INCISE (DRAPES) ×4
DRAPE SLUSH/WARMER DISC (DRAPES) ×4 IMPLANT
DRAPE SRG 135X102X78XABS (DRAPES) ×2 IMPLANT
DRSG COVADERM 4X14 (GAUZE/BANDAGES/DRESSINGS) ×4 IMPLANT
ELECT CAUTERY BLADE 6.4 (BLADE) ×4 IMPLANT
ELECT REM PT RETURN 9FT ADLT (ELECTROSURGICAL) ×8
ELECTRODE REM PT RTRN 9FT ADLT (ELECTROSURGICAL) ×4 IMPLANT
GAUZE SPONGE 4X4 12PLY STRL (GAUZE/BANDAGES/DRESSINGS) ×8 IMPLANT
GLOVE BIO SURGEON STRL SZ 6 (GLOVE) IMPLANT
GLOVE BIO SURGEON STRL SZ 6.5 (GLOVE) ×4 IMPLANT
GLOVE BIO SURGEON STRL SZ7 (GLOVE) ×12 IMPLANT
GLOVE BIO SURGEON STRL SZ7.5 (GLOVE) IMPLANT
GLOVE BIO SURGEONS STRL SZ 6.5 (GLOVE) ×4
GLOVE BIOGEL PI IND STRL 6 (GLOVE) IMPLANT
GLOVE BIOGEL PI IND STRL 6.5 (GLOVE) IMPLANT
GLOVE BIOGEL PI IND STRL 7.0 (GLOVE) IMPLANT
GLOVE BIOGEL PI INDICATOR 6 (GLOVE)
GLOVE BIOGEL PI INDICATOR 6.5 (GLOVE) ×4
GLOVE BIOGEL PI INDICATOR 7.0 (GLOVE) ×2
GLOVE EUDERMIC 7 POWDERFREE (GLOVE) ×8 IMPLANT
GLOVE ORTHO TXT STRL SZ7.5 (GLOVE) IMPLANT
GOWN STRL REUS W/ TWL LRG LVL3 (GOWN DISPOSABLE) ×8 IMPLANT
GOWN STRL REUS W/ TWL XL LVL3 (GOWN DISPOSABLE) ×2 IMPLANT
GOWN STRL REUS W/TWL LRG LVL3 (GOWN DISPOSABLE) ×24
GOWN STRL REUS W/TWL XL LVL3 (GOWN DISPOSABLE) ×4
HEMOSTAT POWDER SURGIFOAM 1G (HEMOSTASIS) ×12 IMPLANT
HEMOSTAT SURGICEL 2X14 (HEMOSTASIS) ×4 IMPLANT
INSERT FOGARTY 61MM (MISCELLANEOUS) IMPLANT
INSERT FOGARTY XLG (MISCELLANEOUS) IMPLANT
KIT BASIN OR (CUSTOM PROCEDURE TRAY) ×4 IMPLANT
KIT CATH CPB BARTLE (MISCELLANEOUS) ×4 IMPLANT
KIT ROOM TURNOVER OR (KITS) ×4 IMPLANT
KIT SUCTION CATH 14FR (SUCTIONS) ×4 IMPLANT
KIT VASOVIEW W/TROCAR VH 2000 (KITS) ×4 IMPLANT
LIQUID BAND (GAUZE/BANDAGES/DRESSINGS) ×2 IMPLANT
NS IRRIG 1000ML POUR BTL (IV SOLUTION) ×20 IMPLANT
PACK OPEN HEART (CUSTOM PROCEDURE TRAY) ×4 IMPLANT
PAD ARMBOARD 7.5X6 YLW CONV (MISCELLANEOUS) ×8 IMPLANT
PAD ELECT DEFIB RADIOL ZOLL (MISCELLANEOUS) ×4 IMPLANT
PENCIL BUTTON HOLSTER BLD 10FT (ELECTRODE) ×4 IMPLANT
PUNCH AORTIC ROT 4.0MM RCL 40 (MISCELLANEOUS) ×2 IMPLANT
PUNCH AORTIC ROTATE 4.0MM (MISCELLANEOUS) IMPLANT
PUNCH AORTIC ROTATE 4.5MM 8IN (MISCELLANEOUS) ×4 IMPLANT
PUNCH AORTIC ROTATE 5MM 8IN (MISCELLANEOUS) IMPLANT
SET CARDIOPLEGIA MPS 5001102 (MISCELLANEOUS) ×2 IMPLANT
SLEEVE SURGEON STRL (DRAPES) ×2 IMPLANT
SPONGE GAUZE 4X4 12PLY STER LF (GAUZE/BANDAGES/DRESSINGS) ×4 IMPLANT
SPONGE INTESTINAL PEANUT (DISPOSABLE) IMPLANT
SPONGE LAP 18X18 X RAY DECT (DISPOSABLE) ×4 IMPLANT
SPONGE LAP 4X18 X RAY DECT (DISPOSABLE) ×6 IMPLANT
SUT BONE WAX W31G (SUTURE) ×4 IMPLANT
SUT ETHIBOND 2 0 SH (SUTURE) ×16
SUT ETHIBOND 2 0 SH 36X2 (SUTURE) IMPLANT
SUT MNCRL AB 4-0 PS2 18 (SUTURE) ×2 IMPLANT
SUT PROLENE 3 0 SH DA (SUTURE) IMPLANT
SUT PROLENE 3 0 SH1 36 (SUTURE) ×4 IMPLANT
SUT PROLENE 4 0 RB 1 (SUTURE) ×4
SUT PROLENE 4 0 SH DA (SUTURE) IMPLANT
SUT PROLENE 4-0 RB1 .5 CRCL 36 (SUTURE) IMPLANT
SUT PROLENE 5 0 C 1 36 (SUTURE) ×4 IMPLANT
SUT PROLENE 6 0 C 1 24 (SUTURE) ×2 IMPLANT
SUT PROLENE 6 0 C 1 30 (SUTURE) ×4 IMPLANT
SUT PROLENE 7 0 BV 1 (SUTURE) IMPLANT
SUT PROLENE 7 0 BV1 MDA (SUTURE) ×6 IMPLANT
SUT PROLENE 8 0 BV175 6 (SUTURE) ×6 IMPLANT
SUT SILK  1 MH (SUTURE) ×6
SUT SILK 1 MH (SUTURE) IMPLANT
SUT SILK 1 TIES 10X30 (SUTURE) ×2 IMPLANT
SUT SILK 2 0 SH CR/8 (SUTURE) ×4 IMPLANT
SUT SILK 2 0 TIES 10X30 (SUTURE) ×2 IMPLANT
SUT SILK 2 0 TIES 17X18 (SUTURE) ×4
SUT SILK 2-0 18XBRD TIE BLK (SUTURE) IMPLANT
SUT SILK 3 0 SH CR/8 (SUTURE) ×2 IMPLANT
SUT SILK 4 0 TIE 10X30 (SUTURE) ×6 IMPLANT
SUT STEEL STERNAL CCS#1 18IN (SUTURE) IMPLANT
SUT STEEL SZ 6 DBL 3X14 BALL (SUTURE) ×6 IMPLANT
SUT TEM PAC WIRE 2 0 SH (SUTURE) ×8 IMPLANT
SUT VIC AB 1 CTX 36 (SUTURE) ×8
SUT VIC AB 1 CTX36XBRD ANBCTR (SUTURE) ×4 IMPLANT
SUT VIC AB 2-0 CT1 27 (SUTURE) ×4
SUT VIC AB 2-0 CT1 TAPERPNT 27 (SUTURE) IMPLANT
SUT VIC AB 2-0 CTX 27 (SUTURE) ×4 IMPLANT
SUT VIC AB 3-0 SH 27 (SUTURE) ×4
SUT VIC AB 3-0 SH 27X BRD (SUTURE) IMPLANT
SUT VIC AB 3-0 X1 27 (SUTURE) ×4 IMPLANT
SUT VICRYL 4-0 PS2 18IN ABS (SUTURE) IMPLANT
SUTURE E-PAK OPEN HEART (SUTURE) ×4 IMPLANT
SYSTEM SAHARA CHEST DRAIN ATS (WOUND CARE) ×6 IMPLANT
TAPE CLOTH SURG 4X10 WHT LF (GAUZE/BANDAGES/DRESSINGS) ×2 IMPLANT
TAPE PAPER 2X10 WHT MICROPORE (GAUZE/BANDAGES/DRESSINGS) ×2 IMPLANT
TOWEL OR 17X24 6PK STRL BLUE (TOWEL DISPOSABLE) ×4 IMPLANT
TOWEL OR 17X26 10 PK STRL BLUE (TOWEL DISPOSABLE) ×4 IMPLANT
TRAY FOLEY IC TEMP SENS 16FR (CATHETERS) ×4 IMPLANT
TUBING INSUFFLATION (TUBING) ×4 IMPLANT
UNDERPAD 30X30 INCONTINENT (UNDERPADS AND DIAPERS) ×4 IMPLANT
WATER STERILE IRR 1000ML POUR (IV SOLUTION) ×8 IMPLANT

## 2015-02-18 NOTE — Procedures (Signed)
Extubation Procedure Note  Patient Details:   Name: Henry Carroll DOB: [birth year] MRN: [id number]   Airway Documentation:     Evaluation  O2 sats: stable throughout Complications: No apparent complications Patient did tolerate procedure well. Bilateral Breath Sounds: Clear   Yes   Pt. Was extubated to a 4L [place] without any complications, dyspnea or stridor noted. Pt. Achieved a goal of -30 on NIF & 900 on VC. Pt. Was instructed on IS, highest goal achieved was 716mL.  Lindsay Soulliere L 02/18/2015, 6:18, PM

## 2015-02-18 NOTE — Anesthesia Postprocedure Evaluation (Signed)
Anesthesia Post Note  Patient: Henry Carroll  Procedure(s) Performed: Procedure(s) (LRB): CORONARY ARTERY BYPASS GRAFTING (CABG) x  four, using bilateral internal mammary arteries and right leg greater saphenous vein harvested endoscopically (N/A) TRANSESOPHAGEAL ECHOCARDIOGRAM (TEE) (N/A)  Patient location during evaluation: SICU Anesthesia Type: General Level of consciousness: sedated Pain management: pain level controlled Vital Signs Assessment: post-procedure vital signs reviewed and stable Respiratory status: patient remains intubated per anesthesia plan Cardiovascular status: stable Anesthetic complications: no    Last Vitals:  Filed Vitals:   02/18/15 0630 02/18/15 1415  BP: 165/81 106/61  Pulse: 78 80  Temp: 36.7 C   Resp: 18 12    Last Pain: There were no vitals filed for this visit.               Marceline Napierala,W. EDMOND

## 2015-02-18 NOTE — Anesthesia Procedure Notes (Addendum)
Central Venous Catheter Insertion Performed by: anesthesiologist 02/18/2015 7:15 AM Patient location: Pre-op. Preanesthetic checklist: patient identified, IV checked, site marked, risks and benefits discussed, surgical consent, monitors and equipment checked, pre-op evaluation, timeout performed and anesthesia consent Position: Trendelenburg Lidocaine 1% used for infiltration Landmarks identified and Seldinger technique used Catheter size: 8.5 Fr Central line was placed.Sheath introducer Swan type and PA catheter depth:thermodilationProcedure performed using ultrasound guided technique. Attempts: 1 Following insertion, line sutured and dressing applied. Post procedure assessment: blood return through all ports, free fluid flow and no air. Patient tolerated the procedure well with no immediate complications.   Procedure Name: Intubation Date/Time: 02/18/2015 7:52 AM Performed by: Layla Maw Pre-anesthesia Checklist: Patient identified, Patient being monitored, Timeout performed, Emergency Drugs available and Suction available Patient Re-evaluated:Patient Re-evaluated prior to inductionOxygen Delivery Method: Circle System Utilized Preoxygenation: Pre-oxygenation with 100% oxygen Intubation Type: IV induction Ventilation: Mask ventilation without difficulty and Oral airway inserted - appropriate to patient size Laryngoscope Size: [place] and 3 Grade View: Grade I Tube type: Oral Tube size: 8.0 mm Number of attempts: 1 Airway Equipment and Method: Stylet Placement Confirmation: ETT inserted through vocal cords under direct vision,  positive ETCO2 and breath sounds checked- equal and bilateral Secured at: 23 cm Tube secured with: Tape Dental Injury: Teeth and Oropharynx as per pre-operative assessment

## 2015-02-18 NOTE — OR Nursing (Signed)
12:55 - 45 minute call to SICU charge nurse

## 2015-02-18 NOTE — Op Note (Signed)
CARDIOVASCULAR SURGERY OPERATIVE NOTE  02/18/2015  Surgeon:  [name], MD  First Assistant: Lars Pinks, PA-C   Preoperative Diagnosis:  Left main and severe multi-vessel coronary artery disease   Postoperative Diagnosis:  Same   Procedure:  1. Median Sternotomy 2. Extracorporeal circulation 3.   Coronary artery bypass grafting x 4   Left internal mammary graft to the LAD  Free right internal mammary graft to the Ramus  SVG to OM  SVG to PDA 4.   Endoscopic vein harvest from the right leg   Anesthesia:  General Endotracheal   Clinical History/Surgical Indication:  The patient is a 65 year old obese diabetic with hypertension, hyperlipidemia, stage 3 CKD, and chronic combined systolic and diastolic congestive heart failure who was admitted in November with an acute exacerbation of heart failure with progressive shortness of breath and lower extremity edema over several days. He never had any chest pain. He had mildly positive troponin of 0.4 that leveled off and was felt to be demand ischemia from CHF. An echo on 11/18 showed an EF of 40-45% with inferolateral hypokinesis. There was mild to moderate MR with a structurally normal mitral valve. He improved with diuresis although his creat rose from 1.5 to 2.26 with diuresis and then came down and has hovered at 1.6-1.8. It was 1.8 pre-cath. He had a stress nuclear exam on 12/1 which showed:  3. Nuclear stress EF: 30%. 4. There was no ST segment deviation noted during stress. 5. Findings consistent with prior myocardial infarction with peri-infarct ischemia. 6. This is an intermediate risk study. 7. The left ventricular ejection fraction is moderately decreased (30-44%).  1. There was a medium-sized, severe basal to mid inferior and inferolateral perfusion defect. This was primarily fixed with some reversibility,  suggesting primarily infarction with some peri-infarct ischemia.  2. EF 30% with inferior and inferolateral severe hypokinesis.  3. Intermediate risk study.   Cath was done today and shows:   Prox RCA lesion, 30% stenosed.  Mid RCA-1 lesion, 60% stenosed.  Mid RCA-2 lesion, 99% stenosed.  Dist RCA-1 lesion, 30% stenosed.  Dist RCA-2 lesion, 40% stenosed.  Ost RPDA lesion, 40% stenosed.  Ramus lesion, 90% stenosed.  Prox Cx to Mid Cx lesion, 100% stenosed.  Ost LM lesion, 50% stenosed.  Prox LAD to Dist LAD lesion, 40% stenosed.  2nd Diag lesion, 40% stenosed.  Dist LAD lesion, 60% stenosed.  1. Triple vessel CAD  2. Moderate ostial left main stenosis.  3. Moderate mid LAD stenosis.  4. Severe mid RCA stenosis 5. Severe intermediate branch stenosis.  6. 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 The OM branch fills from left to left collaterals.    I agree that his coronary disease is best treated by CABG although his operative risk is increased due to the above comorbid risk factors. I am particularly concerned about his kidney function combined with his poorly controlled diabetes and hypertension. His risk of postop acute renal failure is high. He does have some MR on his echo in November but it did not look significant to me and he has no murmur. This will require evaluation with TEE in the OR. I suspect it is ischemic MR related to his LCX and RCA disease. I discussed the operative procedure with the patient and his wife including alternatives, benefits and risks; including but not limited to bleeding, blood transfusion, infection, stroke, myocardial infarction, graft failure, heart block requiring a permanent pacemaker, renal failure requiring dialysis, organ dysfunction,  and death. Henry Carroll understands and agrees to proceed.   Preparation:  The patient was seen in the preoperative holding area and the correct patient, correct  operation were confirmed with the patient after reviewing the medical record and catheterization. The consent was signed by me. Preoperative antibiotics were given. A pulmonary arterial line and radial arterial line were placed by the anesthesia team. The patient was taken back to the operating room and positioned supine on the operating room table. After being placed under general endotracheal anesthesia by the anesthesia team a foley catheter was placed. The neck, chest, abdomen, and both legs were prepped with betadine soap and solution and draped in the usual sterile manner. A surgical time-out was taken and the correct patient and operative procedure were confirmed with the nursing and anesthesia staff.  TEE: performed by Dr. Suann Larry. This showed preserved LV systolic function with trivial MR.   Cardiopulmonary Bypass:  A median sternotomy was performed. The pericardium was opened in the midline. Right ventricular function appeared normal. The ascending aorta was of normal size and had no palpable plaque. There were no contraindications to aortic cannulation or cross-clamping. The patient was fully systemically heparinized and the ACT was maintained > 400 sec. The proximal aortic arch was cannulated with a 20 F aortic cannula for arterial inflow. Venous cannulation was performed via the right atrial appendage using a two-staged venous cannula. An antegrade cardioplegia/vent cannula was inserted into the mid-ascending aorta. Aortic occlusion was performed with a single cross-clamp. Systemic cooling to 32 degrees Centigrade and topical cooling of the heart with iced saline were used. Hyperkalemic antegrade cold blood cardioplegia was used to induce diastolic arrest and was then given at about 20 minute intervals throughout the period of arrest to maintain myocardial temperature at or below 10 degrees centigrade. A temperature probe was inserted into the interventricular septum and an insulating  pad was placed in the pericardium.   Left internal mammary harvest:                                                                                            The left side of the sternum was retracted using the Rultract retractor. The left internal mammary artery was harvested as a pedicle graft. All side branches were clipped. It was a medium-sized vessel of good quality with excellent blood flow. It was ligated distally and divided. It was sprayed with topical papaverine solution to prevent vasospasm.   Right internal mammary harvest:  The right side of the sternum was retracted using the Rultract retractor. The right internal mammary artery was harvested as a free graft since it was not long enough to reach any of the coronaries otherwise. All side branches were clipped. It was a medium-sized vessel of good quality with excellent blood flow. The proximal stump was suture ligated with 2-0 silk.  It was sprayed with topical papaverine solution to prevent vasospasm.   Endoscopic vein harvest:  The right greater saphenous vein was harvested endoscopically through a 2 cm incision medial to the right knee. It was harvested from the upper thigh to below the knee. It  was a medium-sized vein of good quality in the thigh but in the lower leg it became small. The side branches were all ligated with 4-0 silk ties. I examined the left greater saphenous vein adjacent to the left knee through a small incision and it was tiny and not suitable.   Coronary arteries:  The coronary arteries were examined.   LAD:  Heavily diseased proximally but the mid to distal vessel was relatively free of disease  LCX:  Large Ramus that was intramyocardial throughout its extent. It was heavily calcified proximally but the mid-portion was suitable for grafting with mild disease. The occluded OM was diffusely diseased but graftable distally.  RCA:  Severely diseased with calcific plaque. The PDA was soft proximally but  then diffusely diseased. The PL branches were small.   Grafts:  1. LIMA to the LAD: 2.0  mm. It was sewn end to side using 8-0 prolene continuous suture. 2. Free RIMA to the Ramus:  2.5  mm. It was sewn end to side using 8-0 prolene continuous suture. 3. SVG to OM:  1.6 mm. It was sewn end to side using 7-0 prolene continuous suture. 4. SVG to PDA:  1.6 mm. It was sewn end to side using 7-0 prolene continuous suture.  The proximal vein graft anastomoses were performed to the mid-ascending aorta using continuous 6-0 prolene suture. The proximal RIMA anastomosis was performed to the ascending aorta using continuous 7-0 prolene suture since the vein grafts were small caliber and the aorta was of good quality. Graft markers were placed around the proximal anastomoses.   Completion:  The patient was rewarmed to 37 degrees Centigrade. The clamp was removed from the LIMA pedicle and there was rapid warming of the septum and return of ventricular fibrillation. The crossclamp was removed with a time of 104 minutes. There was spontaneous return of sinus rhythm. The distal and proximal anastomoses were checked for hemostasis. The position of the grafts was satisfactory. Two temporary epicardial pacing wires were placed on the right atrium and two on the right ventricle. The patient was weaned from CPB without difficulty on no inotropes. CPB time was 125 minutes. Cardiac output was 5 LPM. Heparin was fully reversed with protamine and the aortic and venous cannulas removed. Hemostasis was achieved. Mediastinal and left pleural drainage tubes were placed. The sternum was closed with double #6 stainless steel wires. The fascia was closed with continuous # 1 vicryl suture. The subcutaneous tissue was closed with 2-0 vicryl continuous suture. The skin was closed with 3-0 vicryl subcuticular suture. All sponge, needle, and instrument counts were reported correct at the end of the case. Dry sterile dressings were placed  over the incisions and around the chest tubes which were connected to pleurevac suction. The patient was then transported to the surgical intensive care unit in critical but stable condition.

## 2015-02-18 NOTE — H&P (Signed)
[address]       [place] [address]             [phone]       Cardiothoracic Surgery History and Physical   Reason for Admission: Left main and severe 3-vessel coronary disease Referring Physician: Dr. [name]  Henry Carroll is an 65 y.o. male.  HPI:   The patient is a 65 year old obese diabetic with hypertension, hyperlipidemia, stage 3 CKD, and chronic combined systolic and diastolic congestive heart failure who was admitted in November with an acute exacerbation of heart failure with progressive shortness of breath and lower extremity edema over several days. He never had any chest pain. He had mildly positive troponin of 0.4 that leveled off and was felt to be demand ischemia from CHF. An echo on 11/18 showed an EF of 40-45% with inferolateral hypokinesis. There was mild to moderate MR with a structurally normal mitral valve. He improved with diuresis although his creat rose from 1.5 to 2.26 with diuresis and then came down and has hovered at 1.6-1.8. It was 1.8 pre-cath. He had a stress nuclear exam on 12/1 which showed:   Nuclear stress EF: 30%.  There was no ST segment deviation noted during stress.  Findings consistent with prior myocardial infarction with peri-infarct ischemia.  This is an intermediate risk study.  The left ventricular ejection fraction is moderately decreased (30-44%).  1. There was a medium-sized, severe basal to mid inferior and inferolateral perfusion defect. This was primarily fixed with some reversibility, suggesting primarily infarction with some peri-infarct ischemia.  2. EF 30% with inferior and inferolateral severe hypokinesis.  3. Intermediate risk study.   Cath was done today and shows:   Prox RCA lesion, 30% stenosed.  Mid RCA-1 lesion, 60% stenosed.  Mid RCA-2 lesion, 99% stenosed.  Dist RCA-1 lesion, 30% stenosed.  Dist RCA-2 lesion, 40% stenosed.  Ost RPDA lesion,  40% stenosed.  Ramus lesion, 90% stenosed.  Prox Cx to Mid Cx lesion, 100% stenosed.  Ost LM lesion, 50% stenosed.  Prox LAD to Dist LAD lesion, 40% stenosed.  2nd Diag lesion, 40% stenosed.  Dist LAD lesion, 60% stenosed.  1. Triple vessel CAD  2. Moderate ostial left main stenosis.  3. Moderate mid LAD stenosis.  4. Severe mid RCA stenosis 5. Severe intermediate branch stenosis.  6. 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 The OM branch fills from left to left collaterals.   Since his admission for heart failure last month he has not had recurrence of edema. He still has some exertional shortness of breath but has been playing golf. He has some exertional fatigue. No chest pain or pressure.  Past Medical History  Diagnosis Date  . Diabetes mellitus   . Asthma   . Hypertension   . Anemia   . Anxiety   . Hyperlipidemia   . CKD (chronic kidney disease)   . Chronic combined systolic (congestive) and diastolic (congestive) heart failure ([facility])     a. 12/31/14: 2D ECHO: EF 40-45%, HK of inf myocardium, G1DD, mod MR  . Morbid obesity ([facility])     a. BMI 33    Past Surgical History  Procedure Laterality Date  . Tonsillectomy  1962  . Hernia repair  2542    Umbilical  . Spine surgery  2006    L4 & L5  . Colon surgery  2011    Colonoscopy  . Cardiac catheterization N/A  02/09/2015    Procedure: Left Heart Cath and Coronary Angiography; Surgeon: Burnell Blanks, MD; Location: [facility]; Service: Cardiovascular; Laterality: N/A;    Family History  Problem Relation Age of Onset  . Diabetes Mother   . Heart disease Father     heart failure later in life  . Stroke Sister   . Hypertension Father   . Heart disease Mother     later in life, age >76  . Kidney disease Daughter   . Sudden death Daughter   . Heart attack Neg  Hx     Social History:  reports that he quit smoking about 5 weeks ago. His smoking use included Cigars. He has never used smokeless tobacco. He reports that he drinks alcohol. He reports that he does not use illicit drugs.  Allergies: No Known Allergies  Medications:  I have reviewed the patient's current medications. Prior to Admission:  Prescriptions prior to admission  Medication Sig Dispense Refill Last Dose  . albuterol (PROVENTIL) (2.5 MG/3ML) 0.083% nebulizer solution Take 3 mLs (2.5 mg total) by nebulization every 6 (six) hours as needed for wheezing or shortness of breath. 3 mL 10 02/09/2015 at Unknown time  . amLODipine (NORVASC) 10 MG tablet Take 1 tablet (10 mg total) by mouth daily. 30 tablet 6 02/09/2015 at e4  . aspirin EC 81 MG tablet Take 81 mg by mouth daily.   02/09/2015 at 0730  . atorvastatin (LIPITOR) 20 MG tablet Take 10 mg by mouth daily.   02/08/2015 at Unknown time  . carvedilol (COREG) 6.25 MG tablet Take 1 tablet (6.25 mg total) by mouth 2 (two) times daily with a meal. 60 tablet 6 02/09/2015 at 0730  . Cholecalciferol ([employer] VITAMIN D3) 1000 UNITS capsule Take 2 capsules (2,000 Units total) by mouth daily. 60 capsule 3 02/09/2015 at Unknown time  . Coenzyme Q10 (CO Q 10 PO) Take 1 tablet by mouth daily.   02/08/2015 at Unknown time  . Fluticasone-Salmeterol (ADVAIR DISKUS) 100-50 MCG/DOSE AEPB Inhale 1 puff into the lungs 2 (two) times daily.   02/09/2015 at Unknown time  . furosemide (LASIX) 40 MG tablet Take 0.5 tablets (20 mg total) by mouth daily. (Patient taking differently: Take 20 mg by mouth 2 (two) times daily. ) 90 tablet 3 02/07/2015  . hydrALAZINE (APRESOLINE) 25 MG tablet Take 1 tablet (25 mg total) by mouth 3 (three) times daily. 180 tablet 5 02/09/2015 at Unknown time  . insulin aspart (NOVOLOG) 100 UNIT/ML injection Inject 8 Units into the skin 3 (three) times daily with meals.  Inject 8 u insulin only for blood sugar greater than 180. Do not skip your meals (Patient taking differently: Inject 10 Units into the skin 3 (three) times daily with meals as needed for high blood sugar. Inject 10 u insulin only for blood sugar greater than 180. Do not skip your meals) 1 vial 6 02/08/2015 at Unknown time  . insulin detemir (LEVEMIR) 100 UNIT/ML injection Inject 0.5 mLs (50 Units total) into the skin 2 (two) times daily. 30 mL 11 02/08/2015 at Unknown time  . isosorbide mononitrate (IMDUR) 30 MG 24 hr tablet Take 1 tablet (30 mg total) by mouth daily. 90 tablet 3 not started yet  . linagliptin (TRADJENTA) 5 MG TABS tablet Take 5 mg by mouth daily.   02/08/2015 at Unknown time  . montelukast (SINGULAIR) 10 MG tablet Take 10 mg by mouth at bedtime.   02/09/2015 at Unknown time  . PROAIR HFA 108 (  90 BASE) MCG/ACT inhaler INHALE TWO PUFFS BY MOUTH EVERY 6 HOURS AS NEEDED FOR SHORTNESS OF BREATH 27 each 3 Past Week at Unknown time  . terbinafine (LAMISIL) 1 % cream Apply 1 application topically daily as needed (foot).    More than a month at Unknown time   Scheduled: . [START ON 02/10/2015] amLODipine 10 mg Oral Daily  . [START ON 02/10/2015] aspirin EC 81 mg Oral Daily  . atorvastatin 10 mg Oral Daily  . carvedilol 6.25 mg Oral BID WC  . cholecalciferol 2,000 Units Oral Daily  . furosemide 20 mg Oral BID  . hydrALAZINE 25 mg Oral TID  . insulin detemir 50 Units Subcutaneous BID  . isosorbide mononitrate 30 mg Oral Daily  . linagliptin 5 mg Oral Daily  . mometasone-formoterol 2 puff Inhalation BID  . montelukast 10 mg Oral QHS  . sodium chloride 3 mL Intravenous Q12H   Continuous: . sodium chloride 50 mL/hr (02/09/15 1356)   HRC:BULAGT chloride, acetaminophen, albuterol, insulin aspart, ondansetron (ZOFRAN) IV, sodium chloride Anti-infectives    None         Lab Results Last 48 Hours    Results for orders placed or performed during the hospital encounter of 02/09/15 (from the past 48 hour(s))  Glucose, capillary Status: Abnormal   Collection Time: 02/09/15 10:03 AM  Result Value Ref Range   Glucose-Capillary 255 (H) 65 - 99 mg/dL  Basic metabolic panel Status: Abnormal   Collection Time: 02/09/15 10:40 AM  Result Value Ref Range   Sodium 138 135 - 145 mmol/L   Potassium 5.3 (H) 3.5 - 5.1 mmol/L   Chloride 107 101 - 111 mmol/L   CO2 26 22 - 32 mmol/L   Glucose, Bld 284 (H) 65 - 99 mg/dL   BUN 35 (H) 6 - 20 mg/dL   Creatinine, Ser 1.80 (H) 0.61 - 1.24 mg/dL   Calcium 9.3 8.9 - 10.3 mg/dL   GFR calc non Af Amer 38 (L) >60 mL/min   GFR calc Af Amer 44 (L) >60 mL/min    Comment: (NOTE) The eGFR has been calculated using the CKD EPI equation. This calculation has not been validated in all clinical situations. eGFR's persistently <60 mL/min signify possible Chronic Kidney Disease.    Anion gap 5 5 - 15  Glucose, capillary Status: Abnormal   Collection Time: 02/09/15 2:02 PM  Result Value Ref Range   Glucose-Capillary 236 (H) 65 - 99 mg/dL       Imaging Results (Last 48 hours)    No results found.    Review of Systems  Constitutional: Positive for malaise/fatigue. Negative for fever, chills and weight loss.  HENT: Negative.  Eyes: Negative.  Respiratory: Positive for shortness of breath. Negative for cough.  Cardiovascular: Positive for chest pain and leg swelling. Negative for palpitations, orthopnea and PND.  Gastrointestinal: Negative.  Genitourinary: Negative.  Musculoskeletal: Negative.  Skin: Negative.  Neurological: Negative.  Endo/Heme/Allergies: Negative.  Psychiatric/Behavioral: The patient is nervous/anxious.   Blood pressure 162/100, pulse 82, temperature 97.7 F (36.5 C), temperature source Oral, resp. rate  18, height _0  (1.854 m), weight 108.863 kg (240 lb), SpO2 100 %. Physical Exam  Constitutional: He is oriented to person, place, and time.  Obese African-American gentleman in no distress.  HENT:  Head: Normocephalic and atraumatic.  Mouth/Throat: Oropharynx is clear and moist.  Eyes: EOM are normal. Pupils are equal, round, and reactive to light.  Neck: Normal range of motion. Neck supple. No JVD present. No  thyromegaly present.  Cardiovascular: Normal rate, regular rhythm and normal heart sounds.  No murmur heard. Diminished pedal pulses  Respiratory: Effort normal and breath sounds normal. No respiratory distress. He has no rales.  GI: Soft. Bowel sounds are normal. He exhibits no distension and no mass. There is no tenderness.  Musculoskeletal: Normal range of motion. He exhibits no edema.  Lymphadenopathy:   He has no cervical adenopathy.  Neurological: He is alert and oriented to person, place, and time. He has normal strength. No cranial nerve deficit or sensory deficit.  Skin: Skin is warm and dry.  Psychiatric: He has a normal mood and affect.                *[facility]*            [address] [place],  [address]              [phone]  ------------------------------------------------------------------- Transthoracic Echocardiography  Patient:  Woodfin, Kiss MR #:    [id number] Study Date: 12/31/2014 Gender:   M Age:    26 Height:   185.4 cm Weight:   113.4 kg BSA:    2.45 m^2 Pt. Status: Room:    3E21C  ADMITTING  Candee Furbish, M.D. SONOGRAPHER Tresa Res, RDCS PERFORMING  [facility], Inpatient ATTENDING  Ardelia Mems [id number] Memory Dance, Isaac Laud [id number]  cc:  ------------------------------------------------------------------- LV EF: 40% -   45%  ------------------------------------------------------------------- Indications:   Dyspnea 786.09.  ------------------------------------------------------------------- History:  Risk factors: Current tobacco use. Hypertension. Diabetes mellitus.  ------------------------------------------------------------------- Study Conclusions  - Left ventricle: The cavity size was normal. Wall thickness was normal. Systolic function was mildly to moderately reduced. The estimated ejection fraction was in the range of 40% to 45%. There is hypokinesis of the inferolateral myocardium. Doppler parameters are consistent with abnormal left ventricular relaxation (grade 1 diastolic dysfunction). - Mitral valve: There was mild to moderate regurgitation.  Transthoracic echocardiography. M-mode, complete 2D, spectral Doppler, and color Doppler. Birthdate: Patient birthdate: 1950/05/29. Age: Patient is 65 yr old. Sex: Gender: male. BMI: 33 kg/m^2. Blood pressure:   143/85 Patient status: Inpatient. Study date: Study date: 12/31/2014. Study time: 12:31 PM. Location: Bedside.  -------------------------------------------------------------------  ------------------------------------------------------------------- Left ventricle: The cavity size was normal. Wall thickness was normal. Systolic function was mildly to moderately reduced. The estimated ejection fraction was in the range of 40% to 45%. Regional wall motion abnormalities:  There is hypokinesis of the inferolateral myocardium. Doppler parameters are consistent with abnormal left ventricular relaxation (grade 1 diastolic dysfunction).  ------------------------------------------------------------------- Aortic valve:  Trileaflet; mildly thickened, mildly calcified leaflets. Mobility was not restricted. Doppler: Transvalvular velocity was within the normal range. There was no stenosis. There was no  regurgitation.  ------------------------------------------------------------------- Aorta: Aortic root: The aortic root was normal in size.  ------------------------------------------------------------------- Mitral valve:  Structurally normal valve.  Mobility was not restricted. Doppler: Transvalvular velocity was within the normal range. There was no evidence for stenosis. There was mild to moderate regurgitation.  Peak gradient (D): 2 mm Hg.  ------------------------------------------------------------------- Left atrium: The atrium was normal in size.  ------------------------------------------------------------------- Right ventricle: The cavity size was normal. Wall thickness was normal. Systolic function was normal.  ------------------------------------------------------------------- Pulmonic valve:  Poorly visualized. Structurally normal valve. Cusp separation was normal. Doppler: Transvalvular velocity was within the normal range. There was no evidence for  stenosis. There was trivial regurgitation.  ------------------------------------------------------------------- Tricuspid valve:  Structurally normal valve.  Doppler: Transvalvular velocity was within the normal range. There was no regurgitation.  ------------------------------------------------------------------- Pulmonary artery:  The main pulmonary artery was normal-sized. Systolic pressure was within the normal range.  ------------------------------------------------------------------- Right atrium: The atrium was normal in size.  ------------------------------------------------------------------- Pericardium: There was no pericardial effusion.  ------------------------------------------------------------------- Systemic veins: Inferior vena cava: The vessel was normal in size.  ------------------------------------------------------------------- Measurements  Left ventricle              Value    Reference LV ID, ED, PLAX chordal     (H)  56.9 mm   43 - 52 LV ID, ES, PLAX chordal     (H)  48.5 mm   23 - 38 LV fx shortening, PLAX chordal  (L)  15  %   >=29 LV PW thickness, ED           9.48 mm   --------- IVS/LV PW ratio, ED           1.03     <=1.3 LV ejection fraction, 1-p A4C      50  %   --------- LV end-diastolic volume, 2-p      132  ml   --------- LV end-systolic volume, 2-p       75  ml   --------- LV ejection fraction, 2-p        43  %   --------- Stroke volume, 2-p           57  ml   --------- LV end-diastolic volume/bsa, 2-p    54  ml/m^2 --------- LV end-systolic volume/bsa, 2-p     31  ml/m^2 --------- Stroke volume/bsa, 2-p         23.3 ml/m^2 --------- LV e&', lateral             6.31 cm/s  --------- LV E/e&', lateral            11.22    --------- LV e&', medial              4.79 cm/s  --------- LV E/e&', medial             14.78    --------- LV e&', average             5.55 cm/s  --------- LV E/e&', average            12.76    --------- Longitudinal strain, TDI        13  %   ---------  Ventricular septum           Value    Reference IVS thickness, ED            9.75 mm   ---------  LVOT                  Value    Reference LVOT ID, S               24  mm   --------- LVOT area                4.52 cm^2  ---------  Aorta                  Value    Reference Aortic root ID, ED           37  mm   ---------  Left atrium  Value    Reference LA ID, A-P, ES             35  mm   --------- LA ID/bsa, A-P              1.43 cm/m^2 <=2.2 LA volume, S              86.1 ml   --------- LA volume/bsa, S            35.2 ml/m^2 --------- LA volume, ES, 1-p A4C         50.6 ml   --------- LA volume/bsa, ES, 1-p A4C       20.7 ml/m^2 --------- LA volume, ES, 1-p A2C         119  ml   --------- LA volume/bsa, ES, 1-p A2C       48.6 ml/m^2 ---------  Mitral valve              Value    Reference Mitral E-wave peak velocity       70.8 cm/s  --------- Mitral A-wave peak velocity       100  cm/s  --------- Mitral deceleration time        180  ms   150 - 230 Mitral peak gradient, D         2   mm Hg --------- Mitral E/A ratio, peak         0.7     ---------  Right ventricle             Value    Reference RV s&', lateral, S            18.5 cm/s  ---------  Legend: (L) and (H) mark values outside specified reference range.  ------------------------------------------------------------------- Prepared and Electronically Authenticated by  Candee Furbish, M.D. 2016-11-18T13:22:17   Assessment/Plan:  1. Left main and severe 3-vessel coronary artery disease with abnormal nuclear stress.  2. Chronic combined systolic and diastolic congestive heart failure with and EF of 30-40% by nuclear stress.  3. Stage 3 chronic kidney disease with creat of 1.8 pre-cath.   4. Morbid obesity  5. Poorly controlled hypertension  6. Poorly controlled diabetes with Hgb A1c of 9.6 on 11/30/2014 and 10-11 on multiple occasions over the past year.  7. Hyperlipidemia   I agree that his coronary disease is best treated by CABG although his operative risk is increased due to the above comorbid risk factors. I am particularly concerned about his kidney function combined with his poorly controlled diabetes and hypertension. His risk of  postop acute renal failure is high.  He does have some MR on his echo in November but it did not look significant to me and he has no murmur. This will require evaluation with TEE in the OR. I suspect it is ischemic MR related to his LCX and RCA disease. I discussed the operative procedure with the patient and his wife including alternatives, benefits and risks; including but not limited to bleeding, blood transfusion, infection, stroke, myocardial infarction, graft failure, heart block requiring a permanent pacemaker, renal failure requiring dialysis, organ dysfunction, and death. Henry Carroll understands and agrees to proceed.  [name] 02/18/2015

## 2015-02-18 NOTE — Brief Op Note (Signed)
02/18/2015  11:52 AM  PATIENT:  Henry Carroll  65 y.o. male  PRE-OPERATIVE DIAGNOSIS:  CAD  POST-OPERATIVE DIAGNOSIS:  CAD  PROCEDURE:  TRANSESOPHAGEAL ECHOCARDIOGRAM (TEE), MEDIAN STERNOTOMY for  CORONARY ARTERY BYPASS GRAFTING (CABG) x 4 (LIMA to LAD, SVG to OM, FREE RIMA to RAMUS INTERMEDIATE, SVG to RCA, using bilateral internal mammary arteries and right leg greater saphenous vein harvested endoscopically   SURGEON:  Surgeon(s) and Role:    * [name], MD - Primary  PHYSICIAN ASSISTANT: Lars Pinks PA-C  ANESTHESIA:   general  EBL:  Total I/O In: -  Out: 500 [Urine:500]  DRAINS: Chest tubes placed in the pleural and mediastinal spaces   COUNTS CORRECT:  YES  DICTATION: .Dragon Dictation  PLAN OF CARE: Admit to inpatient   PATIENT DISPOSITION:  ICU - intubated and hemodynamically stable.   Delay start of Pharmacological VTE agent (>24hrs) due to surgical blood loss or risk of bleeding: yes  BASELINE WEIGHT: 111 kg

## 2015-02-18 NOTE — Progress Notes (Signed)
[facility] BRIEF SICU PROGRESS NOTE  Day of Surgery  S/P Procedure(s) (LRB): CORONARY ARTERY BYPASS GRAFTING (CABG) x  four, using bilateral internal mammary arteries and right leg greater saphenous vein harvested endoscopically (N/A) TRANSESOPHAGEAL ECHOCARDIOGRAM (TEE) (N/A)   Starting to wake on vent AAI paced w/ stable hemodynamics on low dose dopamine and Neo drips O2 sats 100% Chest tube output low UOP adequate Labs okay w/ Hgb 9.9  Plan: Continue routine early postop  Henry Alberts, MD 02/18/2015 5:45 PM

## 2015-02-18 NOTE — Progress Notes (Signed)
  Echocardiogram Echocardiogram Transesophageal has been performed.  Darlina Sicilian M 02/18/2015, 8:47 AM

## 2015-02-18 NOTE — Progress Notes (Signed)
Utilization Review Completed.Henry Carroll T1/07/2015  

## 2015-02-18 NOTE — Transfer of Care (Signed)
Immediate Anesthesia Transfer of Care Note  Patient: Henry Carroll  Procedure(s) Performed: Procedure(s): CORONARY ARTERY BYPASS GRAFTING (CABG) x  four, using bilateral internal mammary arteries and right leg greater saphenous vein harvested endoscopically (N/A) TRANSESOPHAGEAL ECHOCARDIOGRAM (TEE) (N/A)  Patient Location: SICU  Anesthesia Type:General  Level of Consciousness: Patient remains intubated per anesthesia plan  Airway & Oxygen Therapy: Patient remains intubated per anesthesia plan and Patient placed on Ventilator (see vital sign flow sheet for setting)  Post-op Assessment: Report given to RN and Post -op Vital signs reviewed and stable  Post vital signs: Reviewed and stable  Last Vitals:  Filed Vitals:   02/18/15 0630  BP: 165/81  Pulse: 78  Temp: 36.7 C  Resp: 18    Complications: No apparent anesthesia complications

## 2015-02-18 NOTE — Interval H&P Note (Signed)
History and Physical Interval Note:  02/18/2015 5:47 AM  Henry Carroll  has presented today for surgery, with the diagnosis of CAD  The various methods of treatment have been discussed with the patient and family. After consideration of risks, benefits and other options for treatment, the patient has consented to  Procedure(s): CORONARY ARTERY BYPASS GRAFTING (CABG) (N/A) TRANSESOPHAGEAL ECHOCARDIOGRAM (TEE) (N/A) as a surgical intervention .  The patient's history has been reviewed, patient examined, no change in status, stable for surgery.  I have reviewed the patient's chart and labs.  Questions were answered to the patient's satisfaction.     [name]

## 2015-02-18 NOTE — OR Nursing (Signed)
13:35 - 20 minute call to SICU charge nurse

## 2015-02-19 ENCOUNTER — Encounter (HOSPITAL_COMMUNITY): Payer: Self-pay | Admitting: *Deleted

## 2015-02-19 ENCOUNTER — Inpatient Hospital Stay (HOSPITAL_COMMUNITY): Payer: Medicare Other

## 2015-02-19 LAB — BASIC METABOLIC PANEL
Anion gap: 5 (ref 5–15)
BUN: 34 mg/dL — AB (ref 6–20)
CHLORIDE: 112 mmol/L — AB (ref 101–111)
CO2: 23 mmol/L (ref 22–32)
CREATININE: 1.96 mg/dL — AB (ref 0.61–1.24)
Calcium: 8.6 mg/dL — ABNORMAL LOW (ref 8.9–10.3)
GFR calc Af Amer: 39 mL/min — ABNORMAL LOW (ref >60)
GFR calc non Af Amer: 34 mL/min — ABNORMAL LOW (ref >60)
GLUCOSE: 115 mg/dL — AB (ref 65–99)
POTASSIUM: 5.3 mmol/L — AB (ref 3.5–5.1)
SODIUM: 140 mmol/L (ref 135–145)

## 2015-02-19 LAB — CBC
HCT: 27.7 % — ABNORMAL LOW (ref 39.0–52.0)
HEMATOCRIT: 27.2 % — AB (ref 39.0–52.0)
HEMOGLOBIN: 9.4 g/dL — AB (ref 13.0–17.0)
Hemoglobin: 9.2 g/dL — ABNORMAL LOW (ref 13.0–17.0)
MCH: 30.4 pg (ref 26.0–34.0)
MCH: 29.9 pg (ref 26.0–34.0)
MCHC: 34.6 g/dL (ref 30.0–36.0)
MCHC: 33.2 g/dL (ref 30.0–36.0)
MCV: 88 fL (ref 78.0–100.0)
MCV: 89.9 fL (ref 78.0–100.0)
Platelets: 174 10*3/uL (ref 150–400)
Platelets: 179 10*3/uL (ref 150–400)
RBC: 3.09 MIL/uL — AB (ref 4.22–5.81)
RBC: 3.08 MIL/uL — ABNORMAL LOW (ref 4.22–5.81)
RDW: 12.9 % (ref 11.5–15.5)
RDW: 13.3 % (ref 11.5–15.5)
WBC: 11.6 10*3/uL — ABNORMAL HIGH (ref 4.0–10.5)
WBC: 15.1 10*3/uL — ABNORMAL HIGH (ref 4.0–10.5)

## 2015-02-19 LAB — MAGNESIUM: Magnesium: 2.8 mg/dL — ABNORMAL HIGH (ref 1.7–2.4)

## 2015-02-19 LAB — CREATININE, SERUM
CREATININE: 2.24 mg/dL — AB (ref 0.61–1.24)
GFR calc Af Amer: 33 mL/min — ABNORMAL LOW (ref >60)
GFR calc non Af Amer: 29 mL/min — ABNORMAL LOW (ref >60)

## 2015-02-19 LAB — POCT I-STAT, CHEM 8
BUN: 37 mg/dL — AB (ref 6–20)
CALCIUM ION: 1.21 mmol/L (ref 1.13–1.30)
CREATININE: 2.2 mg/dL — AB (ref 0.61–1.24)
Chloride: 105 mmol/L (ref 101–111)
GLUCOSE: 189 mg/dL — AB (ref 65–99)
HCT: 28 % — ABNORMAL LOW (ref 39.0–52.0)
HEMOGLOBIN: 9.5 g/dL — AB (ref 13.0–17.0)
Potassium: 6.4 mmol/L (ref 3.5–5.1)
Sodium: 135 mmol/L (ref 135–145)
TCO2: 21 mmol/L (ref 0–100)

## 2015-02-19 LAB — GLUCOSE, CAPILLARY
Glucose-Capillary: 124 mg/dL — ABNORMAL HIGH (ref 65–99)
Glucose-Capillary: 125 mg/dL — ABNORMAL HIGH (ref 65–99)
Glucose-Capillary: 160 mg/dL — ABNORMAL HIGH (ref 65–99)

## 2015-02-19 MED ORDER — FUROSEMIDE 10 MG/ML IJ SOLN
40.0000 mg | Freq: Once | INTRAMUSCULAR | Status: AC
  Administered 2015-02-19: 40 mg via INTRAVENOUS
  Filled 2015-02-19: qty 4

## 2015-02-19 MED ORDER — INSULIN ASPART 100 UNIT/ML ~~LOC~~ SOLN
0.0000 [IU] | SUBCUTANEOUS | Status: DC
  Administered 2015-02-19: 2 [IU] via SUBCUTANEOUS
  Administered 2015-02-19 – 2015-02-20 (×2): 4 [IU] via SUBCUTANEOUS
  Administered 2015-02-20: 2 [IU] via SUBCUTANEOUS

## 2015-02-19 MED ORDER — SODIUM POLYSTYRENE SULFONATE 15 GM/60ML PO SUSP
30.0000 g | Freq: Once | ORAL | Status: AC
  Administered 2015-02-19: 30 g via ORAL
  Filled 2015-02-19: qty 120

## 2015-02-19 MED ORDER — DOPAMINE-DEXTROSE 3.2-5 MG/ML-% IV SOLN: 0.0000 ug/kg/min | INTRAVENOUS | Status: DC

## 2015-02-19 MED ORDER — FUROSEMIDE 10 MG/ML IJ SOLN
80.0000 mg | Freq: Once | INTRAMUSCULAR | Status: AC
  Administered 2015-02-19: 80 mg via INTRAVENOUS
  Filled 2015-02-19: qty 8

## 2015-02-19 MED ORDER — INSULIN DETEMIR 100 UNIT/ML ~~LOC~~ SOLN
20.0000 [IU] | Freq: Every day | SUBCUTANEOUS | Status: DC
  Administered 2015-02-20: 20 [IU] via SUBCUTANEOUS
  Filled 2015-02-19: qty 0.2

## 2015-02-19 MED ORDER — INSULIN DETEMIR 100 UNIT/ML ~~LOC~~ SOLN
20.0000 [IU] | Freq: Once | SUBCUTANEOUS | Status: AC
  Administered 2015-02-19: 20 [IU] via SUBCUTANEOUS
  Filled 2015-02-19: qty 0.2

## 2015-02-19 MED ORDER — MORPHINE SULFATE (PF) 2 MG/ML IV SOLN
2.0000 mg | INTRAVENOUS | Status: DC | PRN
  Administered 2015-02-19: 2 mg via INTRAVENOUS
  Filled 2015-02-19: qty 1

## 2015-02-19 NOTE — Progress Notes (Signed)
[facility] BRIEF SICU PROGRESS NOTE  1 Day Post-Op  S/P Procedure(s) (LRB): CORONARY ARTERY BYPASS GRAFTING (CABG) x  four, using bilateral internal mammary arteries and right leg greater saphenous vein harvested endoscopically (N/A) TRANSESOPHAGEAL ECHOCARDIOGRAM (TEE) (N/A)   Looks good. Maintaining NSR w/ stable BP off Dopamine O2 sats 97% on RA UOP 50-75 mL/hr Creatinine up to 2.2, potassium up to 6.4 Hgb stable 9.2  Plan: Will give 1 dose lasix 80 mg IV to stimulate UOP and 1 dose Kayexylate - watch serum potassium closely and notify Nephrology team  Rexene Alberts, MD 02/19/2015 5:56 PM

## 2015-02-19 NOTE — Progress Notes (Signed)
Dr. Roxy Manns paged, pt K+ 6.4, MD aware and will place orders, will continue to monitor.   Sherrie Mustache 5:54 PM

## 2015-02-19 NOTE — Progress Notes (Signed)
Henry Carroll       [place] [address]             [phone]        CARDIOTHORACIC SURGERY PROGRESS NOTE   R1 Day Post-Op Procedure(s) (LRB): CORONARY ARTERY BYPASS GRAFTING (CABG) x  four, using bilateral internal mammary arteries and right leg greater saphenous vein harvested endoscopically (N/A) TRANSESOPHAGEAL ECHOCARDIOGRAM (TEE) (N/A)  Subjective: Looks good.  Mild soreness in chest.  Otherwise no complaints.  Objective: Vital signs: BP Readings from Last 1 Encounters:  02/19/15 115/75   Pulse Readings from Last 1 Encounters:  02/19/15 90   Resp Readings from Last 1 Encounters:  02/19/15 21   Temp Readings from Last 1 Encounters:  02/19/15 98.8 F (37.1 C)     Hemodynamics: PAP: (20-36)/(7-19) 34/18 mmHg CO:  [4.5 L/min-5.6 L/min] 5.6 L/min CI:  [1.9 L/min/m2-2.4 L/min/m2] 2.4 L/min/m2  Physical Exam:  Rhythm:   sinus  Breath sounds: clear  Heart sounds:  RRR  Incisions:  Dressing dry, intact  Abdomen:  Soft, non-distended, non-tender  Extremities:  Warm, well-perfused  Chest tubes:  Low volume thin serosanguinous output, no air leak    Intake/Output from previous day: 01/06 0701 - 01/07 0700 In: 6820.3 [I.V.:4605.3; Blood:815; IV Piggyback:1400] Out: 2333 [Urine:1545; Blood:300; Chest Tube:488] Intake/Output this shift: Total I/O In: 324.6 [P.O.:120; I.V.:204.6] Out: 100 [Urine:80; Chest Tube:20]  Lab Results:  CBC: Recent Labs  02/18/15 2010 02/18/15 2011 02/19/15 0400  WBC 12.5*  --  11.6*  HGB 10.4* 9.9* 9.4*  HCT 30.6* 29.0* 27.2*  PLT 216  --  174    BMET:  Recent Labs  02/18/15 2011 02/19/15 0400  NA 136 140  K 5.6* 5.3*  CL 118* 112*  CO2  --  23  GLUCOSE 120* 115*  BUN 32* 34*  CREATININE 2.00* 1.96*  CALCIUM  --  8.6*     PT/INR:   Recent Labs  02/18/15 1434  LABPROT 17.7*  INR 1.44    CBG (last 3)   Recent Labs  02/18/15 2256 02/18/15 2359 02/19/15 0059  GLUCAP 126* 124* 125*      ABG    Component Value Date/Time   PHART 7.315* 02/18/2015 1909   PCO2ART 44.4 02/18/2015 1909   PO2ART 91.0 02/18/2015 1909   HCO3 22.7 02/18/2015 1909   TCO2 23 02/18/2015 2011   ACIDBASEDEF 3.0* 02/18/2015 1909   O2SAT 96.0 02/18/2015 1909    CXR: PORTABLE CHEST 1 VIEW  COMPARISON: Yesterday  FINDINGS: Endotracheal and NG tubes removed. Chest tubes remain in place. Low volumes. Bibasilar atelectasis. No pulmonary edema are pneumothorax. Stable Swan-Ganz catheter.  IMPRESSION: Extubated.  Bibasilar atelectasis  No pneumothorax or pulmonary edema.   Electronically Signed  By: Marybelle Killings M.D.  On: 02/19/2015 08:57  Assessment/Plan: S/P Procedure(s) (LRB): CORONARY ARTERY BYPASS GRAFTING (CABG) x  four, using bilateral internal mammary arteries and right leg greater saphenous vein harvested endoscopically (N/A) TRANSESOPHAGEAL ECHOCARDIOGRAM (TEE) (N/A)  Doing well POD1 Maintaining NSR w/ stable BP off all drips except Dopamine @ 2 O2 sats 92-96% on RA, CXR looks good Expected post op acute blood loss anemia, mild, Hgb 9.4 Expected post op atelectasis, mild Chronic diastolic CHF with expected post-op volume excess, mild Type II diabetes mellitus, excellent glycemic control   Mobilize  D/C tubes and lines  Wean dopamine off and watch renal function  Add levemir and wean insulin off   Henry Alberts, MD 02/19/2015 10:09  AM

## 2015-02-20 ENCOUNTER — Inpatient Hospital Stay (HOSPITAL_COMMUNITY): Payer: Medicare Other

## 2015-02-20 LAB — GLUCOSE, CAPILLARY
GLUCOSE-CAPILLARY: 96 mg/dL (ref 65–99)
GLUCOSE-CAPILLARY: 180 mg/dL — AB (ref 65–99)
Glucose-Capillary: 156 mg/dL — ABNORMAL HIGH (ref 65–99)
Glucose-Capillary: 209 mg/dL — ABNORMAL HIGH (ref 65–99)
Glucose-Capillary: 214 mg/dL — ABNORMAL HIGH (ref 65–99)

## 2015-02-20 LAB — BASIC METABOLIC PANEL
ANION GAP: 5 (ref 5–15)
BUN: 41 mg/dL — ABNORMAL HIGH (ref 6–20)
CALCIUM: 8.3 mg/dL — AB (ref 8.9–10.3)
CO2: 26 mmol/L (ref 22–32)
CREATININE: 2.02 mg/dL — AB (ref 0.61–1.24)
Chloride: 106 mmol/L (ref 101–111)
GFR, EST AFRICAN AMERICAN: 38 mL/min — AB (ref >60)
GFR, EST NON AFRICAN AMERICAN: 33 mL/min — AB (ref >60)
Glucose, Bld: 152 mg/dL — ABNORMAL HIGH (ref 65–99)
Potassium: 4.8 mmol/L (ref 3.5–5.1)
SODIUM: 137 mmol/L (ref 135–145)

## 2015-02-20 LAB — CBC
HCT: 26.6 % — ABNORMAL LOW (ref 39.0–52.0)
HEMOGLOBIN: 8.7 g/dL — AB (ref 13.0–17.0)
MCH: 29.8 pg (ref 26.0–34.0)
MCHC: 32.7 g/dL (ref 30.0–36.0)
MCV: 91.1 fL (ref 78.0–100.0)
PLATELETS: 170 10*3/uL (ref 150–400)
RBC: 2.92 MIL/uL — AB (ref 4.22–5.81)
RDW: 13.3 % (ref 11.5–15.5)
WBC: 13.2 10*3/uL — AB (ref 4.0–10.5)

## 2015-02-20 LAB — POTASSIUM: POTASSIUM: 5 mmol/L (ref 3.5–5.1)

## 2015-02-20 MED ORDER — MOVING RIGHT ALONG BOOK
Freq: Once | Status: AC
  Administered 2015-02-20: 12:00:00
  Filled 2015-02-20: qty 1

## 2015-02-20 MED ORDER — CARVEDILOL 12.5 MG PO TABS
12.5000 mg | ORAL_TABLET | Freq: Two times a day (BID) | ORAL | Status: DC
  Administered 2015-02-20 – 2015-02-22 (×4): 12.5 mg via ORAL
  Filled 2015-02-20 (×4): qty 1

## 2015-02-20 MED ORDER — LINAGLIPTIN 5 MG PO TABS
5.0000 mg | ORAL_TABLET | Freq: Every day | ORAL | Status: DC
  Administered 2015-02-20 – 2015-02-22 (×3): 5 mg via ORAL
  Filled 2015-02-20 (×3): qty 1

## 2015-02-20 MED ORDER — SODIUM CHLORIDE 0.9 % IJ SOLN: 3.0000 mL | INTRAMUSCULAR | Status: DC | PRN

## 2015-02-20 MED ORDER — SODIUM CHLORIDE 0.9 % IV SOLN: 250.0000 mL | INTRAVENOUS | Status: DC | PRN

## 2015-02-20 MED ORDER — INSULIN ASPART 100 UNIT/ML ~~LOC~~ SOLN
0.0000 [IU] | Freq: Three times a day (TID) | SUBCUTANEOUS | Status: DC
  Administered 2015-02-20: 4 [IU] via SUBCUTANEOUS
  Administered 2015-02-20 (×2): 8 [IU] via SUBCUTANEOUS
  Administered 2015-02-21 (×2): 2 [IU] via SUBCUTANEOUS
  Administered 2015-02-21: 4 [IU] via SUBCUTANEOUS
  Administered 2015-02-21: 2 [IU] via SUBCUTANEOUS
  Administered 2015-02-22: 8 [IU] via SUBCUTANEOUS

## 2015-02-20 MED ORDER — FUROSEMIDE 40 MG PO TABS
40.0000 mg | ORAL_TABLET | Freq: Two times a day (BID) | ORAL | Status: DC
  Administered 2015-02-20 – 2015-02-22 (×4): 40 mg via ORAL
  Filled 2015-02-20 (×4): qty 1

## 2015-02-20 MED ORDER — SODIUM CHLORIDE 0.9 % IJ SOLN
3.0000 mL | Freq: Two times a day (BID) | INTRAMUSCULAR | Status: DC
  Administered 2015-02-20: 3 mL via INTRAVENOUS

## 2015-02-20 MED ORDER — INSULIN DETEMIR 100 UNIT/ML ~~LOC~~ SOLN
25.0000 [IU] | Freq: Two times a day (BID) | SUBCUTANEOUS | Status: DC
  Administered 2015-02-20 – 2015-02-21 (×3): 25 [IU] via SUBCUTANEOUS
  Filled 2015-02-20 (×6): qty 0.25

## 2015-02-20 NOTE — Progress Notes (Signed)
[address]       [place] [address]             [phone]        CARDIOTHORACIC SURGERY PROGRESS NOTE   R2 Days Post-Op Procedure(s) (LRB): CORONARY ARTERY BYPASS GRAFTING (CABG) x  four, using bilateral internal mammary arteries and right leg greater saphenous vein harvested endoscopically (N/A) TRANSESOPHAGEAL ECHOCARDIOGRAM (TEE) (N/A)  Subjective: Looks good and feels well.  Appetite improving.  No SOB.  Ambulating fairly well.  Objective: Vital signs: BP Readings from Last 1 Encounters:  02/20/15 143/82   Pulse Readings from Last 1 Encounters:  02/20/15 86   Resp Readings from Last 1 Encounters:  02/20/15 9   Temp Readings from Last 1 Encounters:  02/20/15 98.3 F (36.8 C) Oral    Hemodynamics: PAP: (41-43)/(21-23) 43/23 mmHg  Physical Exam:  Rhythm:   sinus  Breath sounds: clear  Heart sounds:  RRR  Incisions:  Dressing dry, intact  Abdomen:  Soft, non-distended, non-tender  Extremities:  Warm, well-perfused    Intake/Output from previous day: 01/07 0701 - 01/08 0700 In: 1301.8 [P.O.:720; I.V.:481.8; IV Piggyback:100] Out: 2700 [Urine:2510; Chest Tube:190] Intake/Output this shift: Total I/O In: -  Out: 255 [Urine:255]  Lab Results:  CBC: Recent Labs  02/19/15 1610 02/19/15 1619 02/20/15 0425  WBC 15.1*  --  13.2*  HGB 9.2* 9.5* 8.7*  HCT 27.7* 28.0* 26.6*  PLT 179  --  170    BMET:  Recent Labs  02/19/15 0400  02/19/15 1619 02/19/15 2355 02/20/15 0425  NA 140  --  135  --  137  K 5.3*  --  6.4* 5.0 4.8  CL 112*  --  105  --  106  CO2 23  --   --   --  26  GLUCOSE 115*  --  189*  --  152*  BUN 34*  --  37*  --  41*  CREATININE 1.96*  < > 2.20*  --  2.02*  CALCIUM 8.6*  --   --   --  8.3*  < > = values in this interval not displayed.   PT/INR:   Recent Labs  02/18/15 1434  LABPROT 17.7*  INR 1.44    CBG (last 3)   Recent Labs  02/19/15 1654 02/20/15 0409 02/20/15 0755  GLUCAP 160* 156*  96    ABG    Component Value Date/Time   PHART 7.315* 02/18/2015 1909   PCO2ART 44.4 02/18/2015 1909   PO2ART 91.0 02/18/2015 1909   HCO3 22.7 02/18/2015 1909   TCO2 21 02/19/2015 1619   ACIDBASEDEF 3.0* 02/18/2015 1909   O2SAT 96.0 02/18/2015 1909    CXR: PORTABLE CHEST 1 VIEW  COMPARISON: Prior chest x-ray 02/19/2015  FINDINGS: Swan-Ganz catheter, mediastinal and thoracic drains have all been removed. The right IJ vascular sheath remains in place. Patient is status post median sternotomy. Persistent small bilateral pleural effusions and associated bibasilar atelectasis. Overall, inspiratory volumes are improved. No pulmonary edema or pneumothorax. No acute osseous abnormality.  IMPRESSION: 1. Slightly improved inspiratory volumes with persistent but decreasing bibasilar atelectasis and small residual effusions. 2. Interval removal of Swan-Ganz catheter, mediastinal and thoracic drains. No evidence of pneumothorax.   Electronically Signed  By: Jacqulynn Cadet M.D.  On: 02/20/2015 08:52   Assessment/Plan: S/P Procedure(s) (LRB): CORONARY ARTERY BYPASS GRAFTING (CABG) x  four, using bilateral internal mammary arteries and right leg greater saphenous vein harvested endoscopically (N/A) TRANSESOPHAGEAL ECHOCARDIOGRAM (  TEE) (N/A)  Doing well POD2 Maintaining NSR w/ stable BP  O2 sats 95-97% on RA, CXR looks good Expected post op acute blood loss anemia, mild, Hgb down slightly 8.7 Expected post op atelectasis, mild Chronic diastolic CHF with expected post-op volume excess, mild Chronic kidney disease w/ creatinine stable 2.0 and UOP adequate Hyperkalemia has resolved Type II diabetes mellitus, adequate glycemic control   Mobilize  D/C foley and central line  Change CBG's and SSI to ac/hs  Transfer 2W   Rexene Alberts, MD 02/20/2015 10:30 AM

## 2015-02-21 ENCOUNTER — Encounter (HOSPITAL_COMMUNITY): Payer: Self-pay | Admitting: Surgery

## 2015-02-21 ENCOUNTER — Inpatient Hospital Stay (HOSPITAL_COMMUNITY): Payer: Medicare Other

## 2015-02-21 LAB — GLUCOSE, CAPILLARY
GLUCOSE-CAPILLARY: 109 mg/dL — AB (ref 65–99)
GLUCOSE-CAPILLARY: 102 mg/dL — AB (ref 65–99)
GLUCOSE-CAPILLARY: 102 mg/dL — AB (ref 65–99)
GLUCOSE-CAPILLARY: 105 mg/dL — AB (ref 65–99)
GLUCOSE-CAPILLARY: 106 mg/dL — AB (ref 65–99)
GLUCOSE-CAPILLARY: 121 mg/dL — AB (ref 65–99)
GLUCOSE-CAPILLARY: 181 mg/dL — AB (ref 65–99)
Glucose-Capillary: 102 mg/dL — ABNORMAL HIGH (ref 65–99)
Glucose-Capillary: 106 mg/dL — ABNORMAL HIGH (ref 65–99)
Glucose-Capillary: 107 mg/dL — ABNORMAL HIGH (ref 65–99)
Glucose-Capillary: 110 mg/dL — ABNORMAL HIGH (ref 65–99)
Glucose-Capillary: 115 mg/dL — ABNORMAL HIGH (ref 65–99)
Glucose-Capillary: 115 mg/dL — ABNORMAL HIGH (ref 65–99)
Glucose-Capillary: 125 mg/dL — ABNORMAL HIGH (ref 65–99)
Glucose-Capillary: 129 mg/dL — ABNORMAL HIGH (ref 65–99)
Glucose-Capillary: 139 mg/dL — ABNORMAL HIGH (ref 65–99)
Glucose-Capillary: 157 mg/dL — ABNORMAL HIGH (ref 65–99)
Glucose-Capillary: 169 mg/dL — ABNORMAL HIGH (ref 65–99)
Glucose-Capillary: 199 mg/dL — ABNORMAL HIGH (ref 65–99)

## 2015-02-21 LAB — BASIC METABOLIC PANEL
Anion gap: 6 (ref 5–15)
BUN: 42 mg/dL — AB (ref 6–20)
CALCIUM: 8.4 mg/dL — AB (ref 8.9–10.3)
CO2: 26 mmol/L (ref 22–32)
CREATININE: 1.82 mg/dL — AB (ref 0.61–1.24)
Chloride: 106 mmol/L (ref 101–111)
GFR calc non Af Amer: 37 mL/min — ABNORMAL LOW (ref >60)
GFR, EST AFRICAN AMERICAN: 43 mL/min — AB (ref >60)
Glucose, Bld: 129 mg/dL — ABNORMAL HIGH (ref 65–99)
Potassium: 4.3 mmol/L (ref 3.5–5.1)
Sodium: 138 mmol/L (ref 135–145)

## 2015-02-21 LAB — CBC
HEMATOCRIT: 26.6 % — AB (ref 39.0–52.0)
Hemoglobin: 8.9 g/dL — ABNORMAL LOW (ref 13.0–17.0)
MCH: 30.2 pg (ref 26.0–34.0)
MCHC: 33.5 g/dL (ref 30.0–36.0)
MCV: 90.2 fL (ref 78.0–100.0)
Platelets: 173 10*3/uL (ref 150–400)
RBC: 2.95 MIL/uL — ABNORMAL LOW (ref 4.22–5.81)
RDW: 12.8 % (ref 11.5–15.5)
WBC: 10.8 10*3/uL — ABNORMAL HIGH (ref 4.0–10.5)

## 2015-02-21 MED ORDER — LACTULOSE 10 GM/15ML PO SOLN
20.0000 g | Freq: Once | ORAL | Status: AC
  Administered 2015-02-21: 20 g via ORAL
  Filled 2015-02-21: qty 30

## 2015-02-21 MED ORDER — AMLODIPINE BESYLATE 10 MG PO TABS
10.0000 mg | ORAL_TABLET | Freq: Every day | ORAL | Status: DC
  Administered 2015-02-21 – 2015-02-22 (×2): 10 mg via ORAL
  Filled 2015-02-21 (×2): qty 1

## 2015-02-21 MED ORDER — FERROUS SULFATE 325 (65 FE) MG PO TABS
325.0000 mg | ORAL_TABLET | Freq: Every day | ORAL | Status: DC
  Administered 2015-02-21 – 2015-02-22 (×2): 325 mg via ORAL
  Filled 2015-02-21 (×2): qty 1

## 2015-02-21 NOTE — Progress Notes (Signed)
UR Completed. Sharina Petre, RN, BSN.  [phone] 

## 2015-02-21 NOTE — Progress Notes (Addendum)
      [address]       [place] [address]             [phone]        3 Days Post-Op Procedure(s) (LRB): CORONARY ARTERY BYPASS GRAFTING (CABG) x  four, using bilateral internal mammary arteries and right leg greater saphenous vein harvested endoscopically (N/A) TRANSESOPHAGEAL ECHOCARDIOGRAM (TEE) (N/A)  Subjective: Patient has not had a bowel movement yet  Objective: Vital signs in last 24 hours: Temp:  [98.1 F (36.7 C)-99 F (37.2 C)] 98.1 F (36.7 C) (01/09 0346) Pulse Rate:  [83-96] 92 (01/09 0346) Cardiac Rhythm:  [-] Normal sinus rhythm (01/08 2015) Resp:  [11-21] 19 (01/09 0346) BP: (129-159)/(57-86) 147/86 mmHg (01/09 0346) SpO2:  [96 %-99 %] 97 % (01/09 0346) Weight:  [249 lb 8 oz (113.172 kg)] 249 lb 8 oz (113.172 kg) (01/09 0346)  Pre op weight 111 kg Current Weight  02/21/15 249 lb 8 oz (113.172 kg)      Intake/Output from previous day: 01/08 0701 - 01/09 0700 In: 720 [P.O.:720] Out: 330 [Urine:330]   Physical Exam:  Cardiovascular: RRR. Pulmonary: Slightly diminished at bases; no rales, wheezes, or rhonchi. Abdomen: Soft, non tender, bowel sounds present. Extremities: Mild bilateral lower extremity edema. Wounds: Clean and dry.  No erythema or signs of infection.  Lab Results: CBC: Recent Labs  02/20/15 0425 02/21/15 0552  WBC 13.2* 10.8*  HGB 8.7* 8.9*  HCT 26.6* 26.6*  PLT 170 173   BMET:  Recent Labs  02/20/15 0425 02/21/15 0552  NA 137 138  K 4.8 4.3  CL 106 106  CO2 26 26  GLUCOSE 152* 129*  BUN 41* 42*  CREATININE 2.02* 1.82*  CALCIUM 8.3* 8.4*    PT/INR:  Lab Results  Component Value Date   INR 1.44 02/18/2015   INR 0.98 02/16/2015   INR 0.93 02/03/2015   ABG:  INR: Will add last result for INR, ABG once components are confirmed Will add last 4 CBG results once components are confirmed  Assessment/Plan:  1. CV - SR in the 90's and hypertensive. On Coreg 12.5 mg bid. Will restart  Norvasc 10 mg daily for better BP control. 2.  Pulmonary - On room air. CXR appears to show small bilateral pleural effusions and atelectasis. Encourage incentive spirometer. 3. Volume Overload - On Lasix 40 mg bid 4.  Acute blood loss anemia - H and H stable at 8.9 and 26.6. Start oral Ferrous. 5. CKD-Creatinine decreased from 2.02 to 1.82. Baseline appears to be around 1.7 6. DM-CBGs 180/214/121. On Insulin and Tradjenta 5 mg daily. Pre op HGA1C 8.7. Will need close follow up with medical doctor. 7. Remove EPW 8. LOC constipation  ZIMMERMAN,DONIELLE MPA-C 02/21/2015,7:45 AM   Chart reviewed, patient examined, agree with above. He is doing very well overall. Feels well. No BM yet but feels like he will today. Renal function stable. Weight is still 7 lbs over baseline so will continue diuretic. He will probably be able to go home in the next day or two.

## 2015-02-21 NOTE — Progress Notes (Addendum)
CARDIAC REHAB PHASE I   PRE:  Rate/Rhythm: 88 SR  BP:  Sitting: 131/71        SaO2: 97 RA  MODE:  Ambulation: 450 ft   POST:  Rate/Rhythm: 98 SR c/ PVCs  BP:  Sitting: 160/72         SaO2: 99 RA  Pt demonstrated good use of sternal precautions. Pt ambulated 450 ft on RA, hand held assist, steady gait, tolerated well. Pt appeared mildly short of breath, denies DOE, pain, dizziness, declined rest stop, states he is "tired" after walking. Encouraged IS. Pt states he had previously been walking one lap around the SICU, increased distance today. Pt states he is unsure whether or not he will be going home today, states he prefers to review education on the day of discharge. RN notified to page cardiac rehab if pt to discharge today to complete education. Pt to recliner after walk, call bell within reach. Will follow.   [id number] Lenna Sciara, RN, BSN 02/21/2015 9:33 AM

## 2015-02-21 NOTE — Progress Notes (Signed)
Removed epicardial wires per order. 4 intact.  Pt tolerated procedure well.  Pt instructed to remain on bedrest for one hour.  Frequent vitals will be taken and documented. Pt resting with call bell within reach. Payton Emerald, RN

## 2015-02-21 NOTE — Care Management Note (Signed)
Case Management Note  Patient Details  Name: Henry Carroll MRN: [id number] Date of Birth: 01-20-1951  Subjective/Objective:    Pt is s/p CABG x 4           Action/Plan:  Pt is independent from home with wife.  Wife will provide 24 hour supervision post discharge.  Wife informed CM that if pt is discharged today they will stay at the [facility] until driveway is cleared of snow, family is actively working on Engineering geologist.  CM will continue to monitor for disposition needs   Expected Discharge Date:                  Expected Discharge Plan:  Home/Self Care  In-House Referral:     Discharge planning Services  CM Consult  Post Acute Care Choice:    Choice offered to:     DME Arranged:    DME Agency:     HH Arranged:    HH Agency:     Status of Service:  In process, will continue to follow  Medicare Important Message Given:    Date Medicare IM Given:    Medicare IM give by:    Date Additional Medicare IM Given:    Additional Medicare Important Message give by:     If discussed at [place] of Stay Meetings, dates discussed:    Additional Comments:  Maryclare Labrador, RN 02/21/2015, 12:00 PM

## 2015-02-22 LAB — GLUCOSE, CAPILLARY
GLUCOSE-CAPILLARY: 40 mg/dL — AB (ref 65–99)
GLUCOSE-CAPILLARY: 202 mg/dL — AB (ref 65–99)
Glucose-Capillary: 53 mg/dL — ABNORMAL LOW (ref 65–99)

## 2015-02-22 LAB — TYPE AND SCREEN
ABO/RH(D): A POS
ANTIBODY SCREEN: NEGATIVE
UNIT DIVISION: 0
UNIT DIVISION: 0
Unit division: 0
Unit division: 0

## 2015-02-22 LAB — BASIC METABOLIC PANEL
ANION GAP: 9 (ref 5–15)
BUN: 43 mg/dL — ABNORMAL HIGH (ref 6–20)
CALCIUM: 8.2 mg/dL — AB (ref 8.9–10.3)
CO2: 24 mmol/L (ref 22–32)
Chloride: 105 mmol/L (ref 101–111)
Creatinine, Ser: 1.8 mg/dL — ABNORMAL HIGH (ref 0.61–1.24)
GFR, EST AFRICAN AMERICAN: 43 mL/min — AB (ref >60)
GFR, EST NON AFRICAN AMERICAN: 37 mL/min — AB (ref >60)
GLUCOSE: 79 mg/dL (ref 65–99)
POTASSIUM: 3.8 mmol/L (ref 3.5–5.1)
SODIUM: 138 mmol/L (ref 135–145)

## 2015-02-22 MED ORDER — HYDRALAZINE HCL 25 MG PO TABS
25.0000 mg | ORAL_TABLET | Freq: Two times a day (BID) | ORAL | Status: DC
  Administered 2015-02-22: 25 mg via ORAL
  Filled 2015-02-22: qty 1

## 2015-02-22 MED ORDER — FUROSEMIDE 40 MG PO TABS: 40.0000 mg | ORAL_TABLET | Freq: Every day | ORAL | Status: DC

## 2015-02-22 MED ORDER — POTASSIUM CHLORIDE CRYS ER 20 MEQ PO TBCR
20.0000 meq | EXTENDED_RELEASE_TABLET | Freq: Once | ORAL | Status: AC
  Administered 2015-02-22: 20 meq via ORAL
  Filled 2015-02-22: qty 1

## 2015-02-22 MED ORDER — FERROUS SULFATE 325 (65 FE) MG PO TABS: 325.0000 mg | ORAL_TABLET | Freq: Every day | ORAL | Status: DC

## 2015-02-22 MED ORDER — OXYCODONE HCL 5 MG PO TABS: 5.0000 mg | ORAL_TABLET | ORAL | Status: DC | PRN

## 2015-02-22 MED ORDER — BISACODYL 10 MG RE SUPP
10.0000 mg | Freq: Once | RECTAL | Status: AC
  Administered 2015-02-22: 10 mg via RECTAL
  Filled 2015-02-22: qty 1

## 2015-02-22 MED ORDER — ASPIRIN 325 MG PO TBEC: 325.0000 mg | DELAYED_RELEASE_TABLET | Freq: Every day | ORAL | Status: DC

## 2015-02-22 MED ORDER — INSULIN DETEMIR 100 UNIT/ML ~~LOC~~ SOLN
20.0000 [IU] | Freq: Two times a day (BID) | SUBCUTANEOUS | Status: DC
  Administered 2015-02-22: 20 [IU] via SUBCUTANEOUS
  Filled 2015-02-22 (×2): qty 0.2

## 2015-02-22 MED FILL — Heparin Sodium (Porcine) Inj 1000 Unit/ML: INTRAMUSCULAR | Qty: 10 | Status: AC

## 2015-02-22 MED FILL — Lidocaine HCl IV Inj 20 MG/ML: INTRAVENOUS | Qty: 5 | Status: AC

## 2015-02-22 MED FILL — Electrolyte-R (PH 7.4) Solution: INTRAVENOUS | Qty: 3000 | Status: AC

## 2015-02-22 MED FILL — Sodium Chloride IV Soln 0.9%: INTRAVENOUS | Qty: 2000 | Status: AC

## 2015-02-22 MED FILL — Sodium Bicarbonate IV Soln 8.4%: INTRAVENOUS | Qty: 50 | Status: AC

## 2015-02-22 MED FILL — Mannitol IV Soln 20%: INTRAVENOUS | Qty: 500 | Status: AC

## 2015-02-22 NOTE — Progress Notes (Addendum)
      [address]       [place] [address]             [phone]        4 Days Post-Op Procedure(s) (LRB): CORONARY ARTERY BYPASS GRAFTING (CABG) x  four, using bilateral internal mammary arteries and right leg greater saphenous vein harvested endoscopically (N/A) TRANSESOPHAGEAL ECHOCARDIOGRAM (TEE) (N/A)  Subjective: Patient has not had a bowel movement yet  Objective: Vital signs in last 24 hours: Temp:  [98 F (36.7 C)-98.2 F (36.8 C)] 98.2 F (36.8 C) (01/10 0627) Pulse Rate:  [86-94] 90 (01/10 0627) Cardiac Rhythm:  [-] Normal sinus rhythm (01/09 1940) Resp:  [18] 18 (01/10 0627) BP: (121-151)/(67-87) 151/73 mmHg (01/10 0627) SpO2:  [95 %-97 %] 95 % (01/10 0726) Weight:  [247 lb 1.6 oz (112.084 kg)] 247 lb 1.6 oz (112.084 kg) (01/10 0627)  Pre op weight 111 kg Current Weight  02/22/15 247 lb 1.6 oz (112.084 kg)      Intake/Output from previous day: 01/09 0701 - 01/10 0700 In: 600 [P.O.:600] Out: -    Physical Exam:  Cardiovascular: RRR. Pulmonary: Slightly diminished at bases; no rales, wheezes, or rhonchi. Abdomen: Soft, non tender, bowel sounds present. Extremities: Mild bilateral lower extremity edema. Wounds: Clean and dry.  No erythema or signs of infection.  Lab Results: CBC:  Recent Labs  02/20/15 0425 02/21/15 0552  WBC 13.2* 10.8*  HGB 8.7* 8.9*  HCT 26.6* 26.6*  PLT 170 173   BMET:   Recent Labs  02/21/15 0552 02/22/15 0240  NA 138 138  K 4.3 3.8  CL 106 105  CO2 26 24  GLUCOSE 129* 79  BUN 42* 43*  CREATININE 1.82* 1.80*  CALCIUM 8.4* 8.2*    PT/INR:  Lab Results  Component Value Date   INR 1.44 02/18/2015   INR 0.98 02/16/2015   INR 0.93 02/03/2015   ABG:  INR: Will add last result for INR, ABG once components are confirmed Will add last 4 CBG results once components are confirmed  Assessment/Plan:  1. CV - SR in the 90's and still hypertensive. On Coreg 12.5 mg bid and Norvasc 10 mg  daily. SBP still elevated so will restart Hydralazine. 2.  Pulmonary - On room air.  Encourage incentive spirometer. 3. Volume Overload - On Lasix 40 mg bid. Will likely need daily diuresis at discharge. 4.  Acute blood loss anemia - H and H stable at 8.9 and 26.6. Start oral Ferrous. 5. CKD-Creatinine stable at 1.8. Baseline appears to be around 1.7 6. DM-CBGs 181/40/53. On Insulin and Tradjenta 5 mg daily. Will decrease Insulin to avoid further hypoglycemia.Pre op HGA1C 8.7. Will need close follow up with medical doctor. 7. Supplement potassium 8. LOC constipation 9. Will discuss discharge disposition with Dr. Jon Billings MPA-C 02/22/2015,8:03 AM   Chart reviewed, patient examined, agree with above. He looks good and is walking well. Bowels working. He can go home today.

## 2015-02-22 NOTE — Care Management Note (Signed)
Case Management Note  Patient Details  Name: Henry Carroll MRN: [id number] Date of Birth: 14-Feb-1950  Subjective/Objective:    Pt is s/p CABG x 4           Action/Plan:  Pt is independent from home with wife.  Wife will provide 24 hour supervision post discharge.  Wife informed CM that if pt is discharged today they will stay at the [facility] until driveway is cleared of snow, family is actively working on Engineering geologist.  CM will continue to monitor for disposition needs   Expected Discharge Date:                  Expected Discharge Plan:  Home/Self Care  In-House Referral:     Discharge planning Services  CM Consult  Post Acute Care Choice:    Choice offered to:     DME Arranged:    DME Agency:     HH Arranged:    HH Agency:     Status of Service:  In process, will continue to follow  Medicare Important Message Given:    Date Medicare IM Given:    Medicare IM give by:    Date Additional Medicare IM Given:    Additional Medicare Important Message give by:     If discussed at [place] of Stay Meetings, dates discussed:    Additional Comments: Pt will discharge home with wife today Maryclare Labrador, RN 02/22/2015, 1:03 PM

## 2015-02-22 NOTE — Care Management Important Message (Signed)
Important Message  Patient Details  Name: Henry Carroll MRN: [id number] Date of Birth: [birth year]   Medicare Important Message Given:  Yes    Kambrey Hagger Abena 02/22/2015, 5:10 PM

## 2015-02-22 NOTE — Discharge Summary (Signed)
Physician Discharge Summary       [address]       [place] [address]             [phone]    Patient ID: Henry Carroll MRN: [id number] DOB/AGE: Jul 22, 1950 65 y.o.  Admit date: 02/18/2015 Discharge date: 02/22/2015  Principle Diagnosis: Multivessel CAD  Active Diagnoses:  1. Diabetes mellitus 2. Hypertension 3. Hyperlipidemia 4. CKD 5. Chronic combined systolic and diastolic congestive heart failure ([place]) 6. Morbid obesity ([place]) 7. Anemia 8. Anxiety  Procedure (s):   Median Sternotomy  Extracorporeal circulation 3. Coronary artery bypass grafting x 4   Left internal mammary graft to the LAD  Free right internal mammary graft to the Ramus  SVG to OM  SVG to PDA 4. Endoscopic vein harvest from the right leg  History of Presenting Illness: This is a 65 year old obese diabetic with hypertension, hyperlipidemia, stage 3 CKD, and chronic combined systolic and diastolic congestive heart failure who was admitted in November with an acute exacerbation of heart failure with progressive shortness of breath and lower extremity edema over several days. He never had any chest pain. He had mildly positive troponin of 0.4 that leveled off and was felt to be demand ischemia from CHF. An echo on 11/18 showed an EF of 40-45% with inferolateral hypokinesis. There was mild to moderate MR with a structurally normal mitral valve. He improved with diuresis although his creat rose from 1.5 to 2.26 with diuresis and then came down and has hovered at 1.6-1.8. It was 1.8 pre-cath. He had a stress nuclear exam on 12/1 which showed:   Nuclear stress EF: 30%.  There was no ST segment deviation noted during stress.  Findings consistent with prior myocardial infarction with peri-infarct ischemia.  This is an intermediate risk study.  The left ventricular ejection fraction is moderately decreased (30-44%).  1. There was a medium-sized, severe basal to mid  inferior and inferolateral perfusion defect. This was primarily fixed with some reversibility, suggesting primarily infarction with some peri-infarct ischemia.  2. EF 30% with inferior and inferolateral severe hypokinesis.  3. Intermediate risk study.   Cath was done today and shows:   Prox RCA lesion, 30% stenosed.  Mid RCA-1 lesion, 60% stenosed.  Mid RCA-2 lesion, 99% stenosed.  Dist RCA-1 lesion, 30% stenosed.  Dist RCA-2 lesion, 40% stenosed.  Ost RPDA lesion, 40% stenosed.  Ramus lesion, 90% stenosed.  Prox Cx to Mid Cx lesion, 100% stenosed.  Ost LM lesion, 50% stenosed.  Prox LAD to Dist LAD lesion, 40% stenosed.  2nd Diag lesion, 40% stenosed.  Dist LAD lesion, 60% stenosed.  1. Triple vessel CAD  2. Moderate ostial left main stenosis.  3. Moderate mid LAD stenosis.  4. Severe mid RCA stenosis 5. Severe intermediate branch stenosis.  6. 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 The OM branch fills from left to left collaterals.   Since his admission for heart failure last month, he has not had recurrence of edema. He still has some exertional shortness of breath but has been playing golf. He has some exertional fatigue. No chest pain or pressure. [name] discussed the need for coronary artery bypass grafting surgery, although his operative risk is increased due to the above comorbid risk factors. Potential risks, benefits, and complications were discussed with the patient and he agreed to proceed with surgery. Pre operative carotid duplex US showed no significant internal carotid artery stenosis bilaterally. ABI's were 0.62  on the right and 0.28 on the left. He underwent a CABG x 4 on 02/18/2015 by [name].  Brief Hospital Course:  The patient was extubated the evening of surgery without difficulty. He remained afebrile and hemodynamically stable. He was weaned off of Dopamine drip. Gordy Councilman, a line, chest tubes, and foley were  removed early in the post operative course. Lopressor was started and titrated accordingly. He had hyperkalemia and was give Kayexylate. His last potassium was down to 3.8. He was started on oral Ferrous for anemia, although he did not require a post op transfusion. He was volume over loaded and caerfully diuresed as has CKD (baseline creatinine around 1.7).  He was weaned off the insulin drip.. The patient's HGA1C pre op was 8.7. He was restarted on Tradjenta and Insulin.  The patient's glucose remained well controlled. He will need close follow up with his medical doctor after discharge. The patient was felt surgically stable for transfer from the ICU to PCTU for further convalescence on 02/20/2015. He continues to progress with cardiac rehab. He was ambulating on room air. He has been tolerating a diet and has had a bowel movement. Epicardial pacing wires have been remove. Chest tube sutures will be removed int he office after discharge.The patient is felt surgically stable for discharge today.  Latest Vital Signs: Blood pressure 151/73, pulse 90, temperature 98.2 F (36.8 C), temperature source Oral, resp. rate 18, height 6\' 1"  (1.854 m), weight 247 lb 1.6 oz (112.084 kg), SpO2 95 %.  Physical Exam: Cardiovascular: RRR. Pulmonary: Slightly diminished at bases; no rales, wheezes, or rhonchi. Abdomen: Soft, non tender, bowel sounds present. Extremities: Mild bilateral lower extremity edema. Wounds: Clean and dry. No erythema or signs of infection.  Discharge Condition:Stable and discharged to home  Recent laboratory studies:  Lab Results  Component Value Date   WBC 10.8* 02/21/2015   HGB 8.9* 02/21/2015   HCT 26.6* 02/21/2015   MCV 90.2 02/21/2015   PLT 173 02/21/2015   Lab Results  Component Value Date   NA 138 02/22/2015   K 3.8 02/22/2015   CL 105 02/22/2015   CO2 24 02/22/2015   CREATININE 1.80* 02/22/2015   GLUCOSE 79 02/22/2015   Diagnostic Studies: Dg Chest 2  View  02/21/2015  CLINICAL DATA:  Bypass surgery. EXAM: CHEST  2 VIEW COMPARISON:  02/20/2015 FINDINGS: The cardiac silhouette, mediastinal and hilar contours are stable. The right IJ Cordis is been removed. There are persistent small bilateral pleural effusions with overlying bibasilar atelectasis. No pulmonary edema. IMPRESSION: Removal of right IJ Cordis. Persistent small effusions and bibasilar atelectasis. No pneumothorax. Electronically Signed   By: Marijo Sanes M.D.   On: 02/21/2015 08:07   US Renal  02/02/2015  CLINICAL DATA:  Stage III chronic renal disease EXAM: RENAL / URINARY TRACT ULTRASOUND COMPLETE COMPARISON:  None. FINDINGS: Right Kidney: Length: 11.2 cm. Echogenicity and renal cortical thickness are within normal limits. No mass, perinephric fluid, or hydronephrosis visualized. No sonographically demonstrable calculus or ureterectasis. Left Kidney: Length: 11.3 cm. Echogenicity and renal cortical thickness are within normal limits. No mass, perinephric fluid, or hydronephrosis visualized. No sonographically demonstrable calculus or ureterectasis. Bladder: Appears normal for degree of bladder distention. IMPRESSION: Study within normal limits. Electronically Signed   By: Lowella Grip III M.D.   On: 02/02/2015 14:36   Discharge Medications:   Medication List    STOP taking these medications        isosorbide mononitrate 30 MG 24 hr tablet  Commonly known as:  IMDUR     nitroGLYCERIN 0.4 MG SL tablet  Commonly known as:  NITROSTAT      TAKE these medications        ADVAIR DISKUS 100-50 MCG/DOSE Aepb  Generic drug:  Fluticasone-Salmeterol  Inhale 1 puff into the lungs 2 (two) times daily.     albuterol (2.5 MG/3ML) 0.083% nebulizer solution  Commonly known as:  PROVENTIL  Take 3 mLs (2.5 mg total) by nebulization every 6 (six) hours as needed for wheezing or shortness of breath.     PROAIR HFA 108 (90 Base) MCG/ACT inhaler  Generic drug:  albuterol  INHALE TWO PUFFS  BY MOUTH EVERY 6 HOURS AS NEEDED FOR SHORTNESS OF BREATH     amLODipine 10 MG tablet  Commonly known as:  NORVASC  Take 1 tablet (10 mg total) by mouth daily.     aspirin 325 MG EC tablet  Take 1 tablet (325 mg total) by mouth daily.     atorvastatin 40 MG tablet  Commonly known as:  LIPITOR  Take 1 tablet (40 mg total) by mouth every evening.     carvedilol 12.5 MG tablet  Commonly known as:  COREG  Take 1 tablet (12.5 mg total) by mouth 2 (two) times daily with a meal.     Cholecalciferol 1000 units capsule  Commonly known as:  [employer] VITAMIN D3  Take 2 capsules (2,000 Units total) by mouth daily.     CO Q 10 PO  Take 1 tablet by mouth daily.     ferrous sulfate 325 (65 FE) MG tablet  Take 1 tablet (325 mg total) by mouth daily with breakfast. For one month then stop.     furosemide 40 MG tablet  Commonly known as:  LASIX  Take 1 tablet (40 mg total) by mouth daily.     hydrALAZINE 25 MG tablet  Commonly known as:  APRESOLINE  Take 1 tablet (25 mg total) by mouth 3 (three) times daily.     insulin detemir 100 UNIT/ML injection  Commonly known as:  LEVEMIR  Inject 0.5 mLs (50 Units total) into the skin 2 (two) times daily.     montelukast 10 MG tablet  Commonly known as:  SINGULAIR  Take 10 mg by mouth at bedtime.     oxyCODONE 5 MG immediate release tablet  Commonly known as:  Oxy IR/ROXICODONE  Take 1-2 tablets (5-10 mg total) by mouth every 4 (four) hours as needed for severe pain.     terbinafine 1 % cream  Commonly known as:  LAMISIL  Apply 1 application topically daily as needed (foot).     TRADJENTA 5 MG Tabs tablet  Generic drug:  linagliptin  Take 5 mg by mouth daily.       The patient has been discharged on:   1.Beta Blocker:  Yes [ x  ]                              No   [   ]                              If No, reason:  2.Ace Inhibitor/ARB: Yes [   ]  No  [   x ]                                     If No,  reason:Elevated creatinine (CKD)  3.Statin:   Yes [ x  ]                  No  [   ]                  If No, reason:  4.Ecasa:  Yes  [ x  ]                  No   [   ]                  If No, reason:  Follow Up Appointments: Follow-up Information    Follow up with Candee Furbish, MD.   Specialty:  Cardiology   Why:  Call for a follow up appointment for 2 weeks   Contact information:   [id number] [address] [phone]       Follow up with Gildardo Cranker, DO.   Specialty:  Internal Medicine   Why:  Call for a follow up appointment regarding further diabetes management and surveillance of HGA1C 8.7   Contact information:   [place] [address] [phone]       Follow up with Nurse On 03/01/2015.   Why:  Appointment is with nurse only to remove chest tube sutures. Appointment time is at   Contact information:   [address] [place] [address] [phone]      Follow up with [name], MD On 03/23/2015.   Specialty:  Cardiothoracic Surgery   Why:  PA/LAT CXR to be taken (at [facility] which is in the same building as Dr. Vivi Martens office) on 03/23/2015 at 11:45 am ;Appointment time is at 12:30 pm   Contact information:   [address] [place] [address] [phone]       Signed: Lars Pinks MPA-C 02/22/2015, 11:58 AM

## 2015-02-22 NOTE — Discharge Instructions (Signed)
Activity: 1.May walk up steps                2.No lifting more than ten pounds for four weeks.                 3.No driving for four weeks.                4.Stop any activity that causes chest pain, shortness of breath, dizziness, sweating or excessive weakness.                5.Avoid straining.                6.Continue with your breathing exercises daily.  Diet: Diabetic diet, Low fat, Low salt diet and renal diet  Wound Care: May shower.  Clean wounds with mild soap and water daily. Contact the office at [phone] if any problems arise.  Coronary Artery Bypass Grafting, Care After Refer to this sheet in the next few weeks. These instructions provide you with information on caring for yourself after your procedure. Your health care provider may also give you more specific instructions. Your treatment has been planned according to current medical practices, but problems sometimes occur. Call your health care provider if you have any problems or questions after your procedure. WHAT TO EXPECT AFTER THE PROCEDURE Recovery from surgery will be different for everyone. Some people feel well after 3 or 4 weeks, while for others it takes longer. After your procedure, it is typical to have the following:  Nausea and a lack of appetite.   Constipation.  Weakness and fatigue.   Depression or irritability.   Pain or discomfort at your incision site. HOME CARE INSTRUCTIONS  Take medicines only as directed by your health care provider. Do not stop taking medicines or start any new medicines without first checking with your health care provider.  Take your pulse as directed by your health care provider.  Perform deep breathing as directed by your health care provider. If you were given a device called an incentive spirometer, use it to practice deep breathing several times a day. Support your chest with a pillow or your arms when you take deep breaths or cough.  Keep incision areas clean,  dry, and protected. Remove or change any bandages (dressings) only as directed by your health care provider. You may have skin adhesive strips over the incision areas. Do not take the strips off. They will fall off on their own.  Check incision areas daily for any swelling, redness, or drainage.  If incisions were made in your legs, do the following:  Avoid crossing your legs.   Avoid sitting for long periods of time. Change positions every 30 minutes.   Elevate your legs when you are sitting.  Wear compression stockings as directed by your health care provider. These stockings help keep blood clots from forming in your legs.  Take showers once your health care provider approves. Until then, only take sponge baths. Pat incisions dry. Do not rub incisions with a washcloth or towel. Do not take baths, swim, or use a hot tub until your health care provider approves.  Eat foods that are high in fiber, such as raw fruits and vegetables, whole grains, beans, and nuts. Meats should be lean cut. Avoid canned, processed, and fried foods.  Drink enough fluid to keep your urine clear or pale yellow.  Weigh yourself every day. This helps identify if you are retaining fluid that may make your heart  and lungs work harder.  Rest and limit activity as directed by your health care provider. You may be instructed to:  Stop any activity at once if you have chest pain, shortness of breath, irregular heartbeats, or dizziness. Get help right away if you have any of these symptoms.  Move around frequently for short periods or take short walks as directed by your health care provider. Increase your activities gradually. You may need physical therapy or cardiac rehabilitation to help strengthen your muscles and build your endurance.  Avoid lifting, pushing, or pulling anything heavier than 10 lb (4.5 kg) for at least 6 weeks after surgery.  Do not drive until your health care provider approves.  Ask your  health care provider when you may return to work.  Ask your health care provider when you may resume sexual activity.  Keep all follow-up visits as directed by your health care provider. This is important. SEEK MEDICAL CARE IF:  You have swelling, redness, increasing pain, or drainage at the site of an incision.  You have a fever.  You have swelling in your ankles or legs.  You have pain in your legs.   You gain 2 or more pounds (0.9 kg) a day.  You are nauseous or vomit.  You have diarrhea. SEEK IMMEDIATE MEDICAL CARE IF:  You have chest pain that goes to your jaw or arms.  You have shortness of breath.   You have a fast or irregular heartbeat.   You notice a "clicking" in your breastbone (sternum) when you move.   You have numbness or weakness in your arms or legs.  You feel dizzy or light-headed.  MAKE SURE YOU:  Understand these instructions.  Will watch your condition.  Will get help right away if you are not doing well or get worse.   This information is not intended to replace advice given to you by your health care provider. Make sure you discuss any questions you have with your health care provider.   Document Released: 08/18/2004 Document Revised: 02/19/2014 Document Reviewed: 07/08/2012 Elsevier Interactive Patient Education [employer].

## 2015-02-22 NOTE — Progress Notes (Signed)
CARDIAC REHAB PHASE I   Pt sitting up in chair, eating lunch, states he has been ambulating independently with no complaints. States he had a bowel movement and is hoping to go home today. Today [birth year]. Cardiac surgery discharge education completed. Reviewed IS, sternal precautions, activity progression, exercise, heart healthy diet, carb counting, sodium and fluid restrictions, CHF booklet and zone tool, daily weights, and phase 2 cardiac rehab. Pt verbalized understanding, receptive to education. Pt agrees to phase 2 cardiac rehab referral, will send to [place] per pt request. Pt in recliner, call bell within reach.   [id number] Lenna Sciara, RN, BSN 02/22/2015 1:45 PM

## 2015-02-22 NOTE — Progress Notes (Signed)
PATIENT CBG 40 THIS A.M.  GIVEN ORANGE JUICE WITH SUGAR AND GRAHAM CRACKERS.  RECHECKED = 50. GIVEN CEREAL AND MILK.  PATIENT ASYMPTOMATIC AND WITHOUT COMPLAINTS.

## 2015-02-22 NOTE — Progress Notes (Signed)
Patient dressed and ready for discharge, IV removed.  Pt daughter at bedside.  Discharge instructions reviewed.  Patient expresses understanding of need to make/keep follow up up appointments, medication regimen, and instructions specific to healthcare needs.  Patient discharged off unit via w/c to home with wife. Hiram Gash, RN

## 2015-02-23 MED FILL — Heparin Sodium (Porcine) Inj 1000 Unit/ML: INTRAMUSCULAR | Qty: 30 | Status: AC

## 2015-02-23 MED FILL — Potassium Chloride Inj 2 mEq/ML: INTRAVENOUS | Qty: 40 | Status: AC

## 2015-02-23 MED FILL — Magnesium Sulfate Inj 50%: INTRAMUSCULAR | Qty: 10 | Status: AC

## 2015-02-25 ENCOUNTER — Ambulatory Visit (INDEPENDENT_AMBULATORY_CARE_PROVIDER_SITE_OTHER): Payer: Medicare Other | Admitting: Cardiology

## 2015-02-25 ENCOUNTER — Encounter: Payer: Self-pay | Admitting: Cardiology

## 2015-02-25 VITALS — BP 126/70 | HR 86 | Ht 73.0 in | Wt 250.4 lb

## 2015-02-25 DIAGNOSIS — E669 Obesity, unspecified: Secondary | ICD-10-CM

## 2015-02-25 DIAGNOSIS — N183 Chronic kidney disease, stage 3 unspecified: Secondary | ICD-10-CM

## 2015-02-25 DIAGNOSIS — I110 Hypertensive heart disease with heart failure: Secondary | ICD-10-CM | POA: Diagnosis not present

## 2015-02-25 DIAGNOSIS — I5022 Chronic systolic (congestive) heart failure: Secondary | ICD-10-CM

## 2015-02-25 DIAGNOSIS — I2510 Atherosclerotic heart disease of native coronary artery without angina pectoris: Secondary | ICD-10-CM

## 2015-02-25 HISTORY — DX: Chronic systolic (congestive) heart failure: I50.22

## 2015-02-25 HISTORY — DX: Hypertensive heart disease with heart failure: I11.0

## 2015-02-25 NOTE — Patient Instructions (Signed)
Medication Instructions:  The current medical regimen is effective;  continue present plan and medications.  Follow-Up: Follow up in 2 months with Dr Skains.  If you need a refill on your cardiac medications before your next appointment, please call your pharmacy.  Thank you for choosing [facility]!!       

## 2015-02-25 NOTE — Progress Notes (Signed)
Cardiology Office Note   Date:  02/25/2015   ID:  [facility], DOB August 18, 1950, MRN [id number]  PCP:  Gildardo Cranker, DO  Cardiologist:   Candee Furbish, MD       History of Present Illness: [facility] is a 65 y.o. 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 Mild to moderate MR. Prior nuclear stress test was intermediate risk showing a medium-sized severe basal to mid inferior inferolateral perfusion defect. EF on nuclear stress was 30%. His subscale cardiac catheterization demonstrated triple-vessel CAD.  He had some minor hyperkalemia during hospital stay.  Overall he is doing well postoperatively. He is having some minor shortness of breath with exertional activity which is to be expected. Minor postsurgical pain control with oxycodone.      Past Medical History  Diagnosis Date  . Diabetes mellitus   . Asthma   . Hypertension   . Anemia   . Anxiety   . Hyperlipidemia   . CKD (chronic kidney disease), stage III   . Chronic combined systolic (congestive) and diastolic (congestive) heart failure ([place])     a. 12/31/14: 2D ECHO: EF 40-45%, HK of inf myocardium, G1DD, mod MR  . Obesity     a. BMI 33  . Coronary artery disease     a. LHC 01/2015 - triple vessel CAD (mod oLM, mLAD, severe mRCA, intermediate branch stenosis, CTO of mCx). Plan CABG 02/2015.  Marland Kitchen NSVT (nonsustained ventricular tachycardia) ([facility])     a. 9 beats during 01/2015 adm. BB titrated.  . Mitral regurgitation     a. Mild-mod by echo 12/2014.  Marland Kitchen Myocardial infarction [facility])     pt. states per [name] he has in the past.  . 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 Tonsillectomy  1962    . Hernia repair  0000000    Umbilical  . Spine surgery  2006    L4 & L5  . Colon surgery  2011    Colonoscopy  . Cardiac catheterization N/A 02/09/2015    Procedure: Left Heart Cath and Coronary Angiography;  Surgeon: Burnell Blanks, MD;  Location: [facility];  Service: Cardiovascular;  Laterality: N/A;  . Coronary artery bypass graft N/A 02/18/2015    Procedure: CORONARY ARTERY BYPASS GRAFTING (CABG) x  four, using bilateral internal mammary arteries and right leg greater saphenous vein harvested endoscopically;  Surgeon: [name], MD;  Location: [facility];  Service: Open Heart Surgery;  Laterality: N/A;  . Tee without cardioversion N/A 02/18/2015    Procedure: TRANSESOPHAGEAL ECHOCARDIOGRAM (TEE);  Surgeon: [name], MD;  Location: [facility];  Service: Open Heart Surgery;  Laterality: N/A;     Current Outpatient Prescriptions  Medication Sig Dispense Refill  . albuterol (PROVENTIL) (2.5 MG/3ML) 0.083% nebulizer solution Take 3 mLs (2.5 mg total) by nebulization every 6 (six) hours as needed for wheezing or shortness of breath. 3 mL 10  . amLODipine (NORVASC) 10 MG tablet Take 1 tablet (10 mg total) by mouth daily. 30 tablet 6  . aspirin EC 325 MG EC tablet Take 1 tablet (325 mg total) by mouth daily.    Marland Kitchen atorvastatin (LIPITOR) 40  MG tablet Take 1 tablet (40 mg total) by mouth every evening. 30 tablet 6  . carvedilol (COREG) 12.5 MG tablet Take 1 tablet (12.5 mg total) by mouth 2 (two) times daily with a meal. 60 tablet 6  . Cholecalciferol ([employer] VITAMIN D3) 1000 UNITS capsule Take 2 capsules (2,000 Units total) by mouth daily. 60 capsule 3  . Coenzyme Q10 (CO Q 10 PO) Take 1 tablet by mouth daily.    . ferrous sulfate 325 (65 FE) MG tablet Take 1 tablet (325 mg total) by mouth daily with breakfast. For one month then stop.  0  . Fluticasone-Salmeterol (ADVAIR DISKUS) 100-50 MCG/DOSE AEPB Inhale 1 puff into the lungs 2 (two) times daily.    . furosemide (LASIX) 40 MG tablet  Take 1 tablet (40 mg total) by mouth daily. 30 tablet 0  . hydrALAZINE (APRESOLINE) 25 MG tablet Take 1 tablet (25 mg total) by mouth 3 (three) times daily. 180 tablet 5  . insulin detemir (LEVEMIR) 100 UNIT/ML injection Inject 0.5 mLs (50 Units total) into the skin 2 (two) times daily. 30 mL 11  . linagliptin (TRADJENTA) 5 MG TABS tablet Take 5 mg by mouth daily.    . montelukast (SINGULAIR) 10 MG tablet Take 10 mg by mouth at bedtime.    Marland Kitchen oxyCODONE (OXY IR/ROXICODONE) 5 MG immediate release tablet Take 1-2 tablets (5-10 mg total) by mouth every 4 (four) hours as needed for severe pain. 30 tablet 0  . PROAIR HFA 108 (90 BASE) MCG/ACT inhaler INHALE TWO PUFFS BY MOUTH EVERY 6 HOURS AS NEEDED FOR SHORTNESS OF BREATH 27 each 3  . terbinafine (LAMISIL) 1 % cream Apply 1 application topically daily as needed (foot).      No current facility-administered medications for this visit.    Allergies:   Review of patient's allergies indicates no known allergies.    Social History:  The patient  reports that he quit smoking about 8 weeks ago. His smoking use included Cigars. He has never used smokeless tobacco. He reports that he drinks alcohol. He reports that he does not use illicit drugs.   Family History:  The patient's family history includes Diabetes in his mother; Heart disease in his father and mother; Hypertension in his father; Kidney disease in his daughter; Stroke in his sister; Sudden death in his daughter. There is no history of Heart attack.    ROS:  Please see the history of present illness.   Otherwise, review of systems are positive for none.   All other systems are reviewed and negative.    PHYSICAL EXAM: VS:  BP 126/70 mmHg  Pulse 86  Ht 6\' 1"  (1.854 m)  Wt 250 lb 6.4 oz (113.581 kg)  BMI 33.04 kg/m2  SpO2 98% , BMI Body mass index is 33.04 kg/(m^2). GEN: Well nourished, well developed, in no acute distress HEENT: normal Neck: no JVD, carotid bruits, or masses Cardiac: RRR;  no murmurs, rubs, or gallops,no edema chest scar well-healing Respiratory:  clear to auscultation bilaterally, normal work of breathing GI: soft, nontender, nondistended, + BS MS: no deformity or atrophy Skin: warm and dry, no rash, 2+ bilateral lower extremity edema, right leg vein harvest site CDI Neuro:  Strength and sensation are intact Psych: euthymic mood, full affect   EKG:  None today   Recent Labs: 12/29/2014: B Natriuretic Peptide 873.8* 12/30/2014: TSH 1.603 02/16/2015: ALT 25 02/19/2015: Magnesium 2.8* 02/21/2015: Hemoglobin 8.9*; Platelets 173 02/22/2015: BUN 43*; Creatinine, Ser 1.80*; Potassium 3.8; Sodium  138    Lipid Panel    Component Value Date/Time   CHOL 153 11/30/2014 1022   TRIG 145 11/30/2014 1022   HDL 41 11/30/2014 1022   CHOLHDL 3.7 11/30/2014 1022   LDLCALC 83 11/30/2014 1022      Wt Readings from Last 3 Encounters:  02/25/15 250 lb 6.4 oz (113.581 kg)  02/22/15 247 lb 1.6 oz (112.084 kg)  02/16/15 244 lb (110.678 kg)      Other studies Reviewed: Additional studies/ records that were reviewed today include: Hospital records, lab work, catheterization. Review of the above records demonstrates: As above   ASSESSMENT AND PLAN:  Coronary artery disease status post CABG 02/18/15  - Doing well postoperatively  - Continuing with diuresis  - [name] visit soon  - Aspirin 325 currently.  Chronic systolic heart failure  - Hopefully, ejection fraction will improve post revascularization.  - Currently 30% on nuclear stress test, 40% echocardiogram  - Continue with carvedilol 12.5 twice a day  - No ACE inhibitor because of chronic kidney disease  - Continuing isosorbide, hydralazine  - Furosemide  Hypertensive heart disease with heart failure  - Blood pressure medications reviewed as above.  - Stable.  Hyperlipidemia  -Continue with atorvastatin 40 mg  - I would like to see LDL less than 70.  Diabetes with vascular complications  - CAD  -  Per primary team  CKD 3  - Avoid Aleve. Discussed.   Current medicines are reviewed at length with the patient today.  The patient does not have concerns regarding medicines.  The following changes have been made:  no change  Labs/ tests ordered today include: none No orders of the defined types were placed in this encounter.     Disposition:   FU with [facility] in 2 months  Signed, Candee Furbish, MD  02/25/2015 3:28 PM    [facility] [place], [place], [place]  [address] Phone: [phone]; Fax: [phone]

## 2015-02-28 ENCOUNTER — Other Ambulatory Visit: Payer: Federal, State, Local not specified - PPO

## 2015-02-28 ENCOUNTER — Other Ambulatory Visit: Payer: Self-pay | Admitting: *Deleted

## 2015-02-28 DIAGNOSIS — I25119 Atherosclerotic heart disease of native coronary artery with unspecified angina pectoris: Secondary | ICD-10-CM

## 2015-03-01 ENCOUNTER — Other Ambulatory Visit: Payer: Self-pay | Admitting: *Deleted

## 2015-03-01 ENCOUNTER — Other Ambulatory Visit: Payer: Self-pay | Admitting: Nephrology

## 2015-03-01 ENCOUNTER — Ambulatory Visit (INDEPENDENT_AMBULATORY_CARE_PROVIDER_SITE_OTHER): Payer: Self-pay

## 2015-03-01 DIAGNOSIS — I2510 Atherosclerotic heart disease of native coronary artery without angina pectoris: Secondary | ICD-10-CM

## 2015-03-01 DIAGNOSIS — Z4802 Encounter for removal of sutures: Secondary | ICD-10-CM

## 2015-03-01 DIAGNOSIS — R809 Proteinuria, unspecified: Secondary | ICD-10-CM | POA: Diagnosis not present

## 2015-03-01 DIAGNOSIS — E1129 Type 2 diabetes mellitus with other diabetic kidney complication: Secondary | ICD-10-CM | POA: Diagnosis not present

## 2015-03-01 DIAGNOSIS — Z951 Presence of aortocoronary bypass graft: Secondary | ICD-10-CM

## 2015-03-01 DIAGNOSIS — N183 Chronic kidney disease, stage 3 (moderate): Secondary | ICD-10-CM | POA: Diagnosis not present

## 2015-03-01 DIAGNOSIS — D472 Monoclonal gammopathy: Secondary | ICD-10-CM | POA: Diagnosis not present

## 2015-03-01 DIAGNOSIS — I10 Essential (primary) hypertension: Secondary | ICD-10-CM | POA: Diagnosis not present

## 2015-03-01 DIAGNOSIS — G8918 Other acute postprocedural pain: Secondary | ICD-10-CM

## 2015-03-01 LAB — CBC WITH DIFFERENTIAL/PLATELET
BASOS ABS: 0 10*3/uL (ref 0.0–0.2)
Basos: 0 %
EOS (ABSOLUTE): 0.2 10*3/uL (ref 0.0–0.4)
Eos: 2 %
Hematocrit: 25.6 % — ABNORMAL LOW (ref 37.5–51.0)
Hemoglobin: 8.6 g/dL — ABNORMAL LOW (ref 12.6–17.7)
IMMATURE GRANULOCYTES: 1 %
Immature Grans (Abs): 0 10*3/uL (ref 0.0–0.1)
LYMPHS ABS: 1.9 10*3/uL (ref 0.7–3.1)
Lymphs: 23 %
MCH: 30.4 pg (ref 26.6–33.0)
MCHC: 33.6 g/dL (ref 31.5–35.7)
MCV: 91 fL (ref 79–97)
MONOS ABS: 0.9 10*3/uL (ref 0.1–0.9)
Monocytes: 10 %
NEUTROS PCT: 64 %
Neutrophils Absolute: 5.3 10*3/uL (ref 1.4–7.0)
Platelets: 438 10*3/uL — ABNORMAL HIGH (ref 150–379)
RBC: 2.83 x10E6/uL — AB (ref 4.14–5.80)
RDW: 13.6 % (ref 12.3–15.4)
WBC: 8.4 10*3/uL (ref 3.4–10.8)

## 2015-03-01 LAB — BASIC METABOLIC PANEL
BUN / CREAT RATIO: 20 (ref 10–22)
BUN: 34 mg/dL — AB (ref 8–27)
CHLORIDE: 101 mmol/L (ref 96–106)
CO2: 25 mmol/L (ref 18–29)
CREATININE: 1.68 mg/dL — AB (ref 0.76–1.27)
Calcium: 9.2 mg/dL (ref 8.6–10.2)
GFR calc Af Amer: 48 mL/min/{1.73_m2} — ABNORMAL LOW (ref >59)
GFR calc non Af Amer: 41 mL/min/{1.73_m2} — ABNORMAL LOW (ref >59)
GLUCOSE: 64 mg/dL — AB (ref 65–99)
Potassium: 5.6 mmol/L — ABNORMAL HIGH (ref 3.5–5.2)
Sodium: 141 mmol/L (ref 134–144)

## 2015-03-01 MED ORDER — OXYCODONE HCL 5 MG PO TABS: 5.0000 mg | ORAL_TABLET | ORAL | Status: DC | PRN

## 2015-03-01 NOTE — Progress Notes (Unsigned)
Mr. Mctee returns suture removal of

## 2015-03-02 ENCOUNTER — Ambulatory Visit (INDEPENDENT_AMBULATORY_CARE_PROVIDER_SITE_OTHER): Payer: Medicare Other | Admitting: Internal Medicine

## 2015-03-02 ENCOUNTER — Encounter: Payer: Self-pay | Admitting: Internal Medicine

## 2015-03-02 VITALS — BP 142/80 | HR 80 | Temp 97.8°F | Resp 20 | Ht 73.0 in | Wt 247.4 lb

## 2015-03-02 DIAGNOSIS — I739 Peripheral vascular disease, unspecified: Secondary | ICD-10-CM

## 2015-03-02 DIAGNOSIS — E785 Hyperlipidemia, unspecified: Secondary | ICD-10-CM

## 2015-03-02 DIAGNOSIS — E1121 Type 2 diabetes mellitus with diabetic nephropathy: Secondary | ICD-10-CM

## 2015-03-02 DIAGNOSIS — N189 Chronic kidney disease, unspecified: Secondary | ICD-10-CM | POA: Diagnosis not present

## 2015-03-02 DIAGNOSIS — N183 Chronic kidney disease, stage 3 unspecified: Secondary | ICD-10-CM

## 2015-03-02 DIAGNOSIS — D631 Anemia in chronic kidney disease: Secondary | ICD-10-CM | POA: Diagnosis not present

## 2015-03-02 DIAGNOSIS — I10 Essential (primary) hypertension: Secondary | ICD-10-CM | POA: Diagnosis not present

## 2015-03-02 DIAGNOSIS — J4520 Mild intermittent asthma, uncomplicated: Secondary | ICD-10-CM

## 2015-03-02 DIAGNOSIS — IMO0002 Reserved for concepts with insufficient information to code with codable children: Secondary | ICD-10-CM

## 2015-03-02 DIAGNOSIS — E1165 Type 2 diabetes mellitus with hyperglycemia: Secondary | ICD-10-CM | POA: Diagnosis not present

## 2015-03-02 DIAGNOSIS — I2510 Atherosclerotic heart disease of native coronary artery without angina pectoris: Secondary | ICD-10-CM

## 2015-03-02 HISTORY — DX: Peripheral vascular disease, unspecified: I73.9

## 2015-03-02 NOTE — Progress Notes (Signed)
Patient ID: Henry Carroll, male   DOB: 14-Aug-1950, 65 y.o.   MRN: [id number]    Location:    [facility]   Place of Service:   OFFICE  Chief Complaint  Patient presents with  . Medical Management of Chronic Issues    3 month follow-up    HPI:  65 yo male seen today for hospital f/u. He was d/cd 02/22/15 with multivessel CAD and underwent CABG x 4 vessels by Dr Charlott Rakes on 02/18/15. Vein harvested from right leg. ABIs 0.62 on right and 0.28 on left. Cr 1.8 and Hgb 8.9 at d/c. BP meds adjusted  He reports feeling tired today. He has not started cardiac rehab yet. He was given permission to begin walking yesterday. He saw CT surgeon yesterday and sutures removed. He saw cardiology last week. No CP. He has some DOE with prolonged walking but resolves when he sits down. No issues with SOB while ambulating in home. He has some swelling in RLE but it is improving.    DM - BS at home 110s - 160s, rarely >200. No low BS reactions. Occasionally uses Novolog but no more than 4 times per month. No numbness or tingling. Takes tradjenta and levemir. Uses 50 units levemir due to frequent low BS reactions while in hospital. A1c 8.7%  HTN - stable. No muscle cramps since last OV. Takes lasix, coreg, hydralazine, amlodipine. Takes ASA daily  Hyperlipidemia - stable on statin. No myalgias  Asthma - no attacks in the last yr. He takes advair and albuterol neb. Takes singulair  CKD - followed by nephrology Dr Mercy Moore  Past Medical History  Diagnosis Date  . Diabetes mellitus   . Asthma   . Hypertension   . Anemia   . Anxiety   . Hyperlipidemia   . CKD (chronic kidney disease), stage III   . Chronic combined systolic (congestive) and diastolic (congestive) heart failure ([place])     a. 12/31/14: 2D ECHO: EF 40-45%, HK of inf myocardium, G1DD, mod MR  . Obesity     a. BMI 33  . Coronary artery disease     a. LHC 01/2015 - triple vessel CAD (mod oLM, mLAD, severe mRCA, intermediate branch stenosis, CTO of  mCx). Plan CABG 02/2015.  Marland Kitchen NSVT (nonsustained ventricular tachycardia) ([facility])     a. 9 beats during 01/2015 adm. BB titrated.  . Mitral regurgitation     a. Mild-mod by echo 12/2014.  Marland Kitchen Myocardial infarction [facility])     pt. states per [name] he has in the past.  . 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 Tonsillectomy  1962  . Hernia repair  1191    Umbilical  . Spine surgery  2006    L4 & L5  . Colon surgery  2011    Colonoscopy  . Cardiac catheterization N/A 02/09/2015    Procedure: Left Heart Cath and Coronary Angiography;  Surgeon: Burnell Blanks, MD;  Location: [facility] [facility];  Service: Cardiovascular;  Laterality: N/A;  . Coronary artery bypass graft N/A 02/18/2015    Procedure: CORONARY ARTERY BYPASS GRAFTING (CABG) x  four, using bilateral internal mammary arteries and right leg greater saphenous vein harvested endoscopically;  Surgeon: [name], MD;  Location: [place];  Service: Open Heart Surgery;  Laterality: N/A;  . Tee without cardioversion N/A 02/18/2015    Procedure: TRANSESOPHAGEAL ECHOCARDIOGRAM (TEE);  Surgeon: [name], MD;  Location: [facility];  Service: Open Heart Surgery;  Laterality: N/A;    Patient Care Team: Gildardo Cranker, DO as PCP - General (Internal Medicine)  Social History   Social History  . Marital Status: Married    Spouse Name: N/A  . Number of Children: N/A  . Years of Education: N/A   Occupational History  . Not on file.   Social History Main Topics  . Smoking status: Former Smoker    Types: Cigars    Quit date: 12/31/2014  . Smokeless tobacco: Never Used  . Alcohol Use: 0.0 oz/week    0 Standard drinks or equivalent per week     Comment: socailly  . Drug Use: No  . Sexual Activity: Yes    Birth Control/ Protection: None   Other Topics Concern  . Not on file   Social History Narrative     reports that he quit smoking about 2 months ago. His smoking use included  Cigars. He has never used smokeless tobacco. He reports that he drinks alcohol. He reports that he does not use illicit drugs.  No Known Allergies  Medications: Patient's Medications  New Prescriptions   No medications on file  Previous Medications   ALBUTEROL (PROVENTIL) (2.5 MG/3ML) 0.083% NEBULIZER SOLUTION    Take 3 mLs (2.5 mg total) by nebulization every 6 (six) hours as needed for wheezing or shortness of breath.   AMLODIPINE (NORVASC) 10 MG TABLET    Take 1 tablet (10 mg total) by mouth daily.   ASPIRIN EC 325 MG EC TABLET    Take 1 tablet (325 mg total) by mouth daily.   ATORVASTATIN (LIPITOR) 40 MG TABLET    Take 1 tablet (40 mg total) by mouth every evening.   CARVEDILOL (COREG) 12.5 MG TABLET    Take 1 tablet (12.5 mg total) by mouth 2 (two) times daily with a meal.   CHOLECALCIFEROL ([employer] VITAMIN D3) 1000 UNITS CAPSULE    Take 2 capsules (2,000 Units total) by mouth daily.   COENZYME Q10 (CO Q 10 PO)    Take 1 tablet by mouth daily.   FERROUS SULFATE 325 (65 FE) MG TABLET    Take 1 tablet (325 mg total) by mouth daily with breakfast. For one month then stop.   FLUTICASONE-SALMETEROL (ADVAIR DISKUS) 100-50 MCG/DOSE AEPB    Inhale 1 puff into the lungs 2 (two) times daily.   FUROSEMIDE (LASIX) 40 MG TABLET    Take 1 tablet (40 mg total) by mouth daily.   HYDRALAZINE (APRESOLINE) 25 MG TABLET    Take 1 tablet (25 mg total) by mouth 3 (three) times daily.   INSULIN DETEMIR (LEVEMIR) 100 UNIT/ML INJECTION    Inject 0.5 mLs (50 Units total) into the skin 2 (two) times daily.   LINAGLIPTIN (TRADJENTA) 5 MG TABS TABLET    Take 5 mg by mouth daily.   MONTELUKAST (SINGULAIR) 10 MG TABLET    Take 10 mg by mouth at bedtime.   OXYCODONE (OXY IR/ROXICODONE) 5 MG IMMEDIATE RELEASE TABLET    Take 1-2 tablets (5-10 mg total) by mouth every 4 (four) hours as needed for severe pain.   PROAIR HFA 108 (90 BASE) MCG/ACT INHALER    INHALE TWO PUFFS BY MOUTH EVERY 6 HOURS AS NEEDED FOR SHORTNESS OF  BREATH   TERBINAFINE (LAMISIL) 1 % CREAM    Apply 1 application topically daily as needed (foot).   Modified Medications   No medications on file  Discontinued Medications  No medications on file    Review of Systems  Filed Vitals:   03/02/15 1605  Pulse: 80  Temp: 97.8 F (36.6 C)  TempSrc: Oral  Resp: 20  Height: '6\' 1"'$  (1.854 m)  Weight: 247 lb 6.4 oz (112.22 kg)  SpO2: 96%   Body mass index is 32.65 kg/(m^2).  Physical Exam  Constitutional: He is oriented to person, place, and time. He appears well-developed and well-nourished.  Looks pale in NAD  HENT:  Mouth/Throat: Oropharynx is clear and moist.  Eyes: Pupils are equal, round, and reactive to light. No scleral icterus.  Neck: Neck supple. Carotid bruit is not present. No thyromegaly present.  Cardiovascular: Normal rate, regular rhythm, normal heart sounds and intact distal pulses.  Exam reveals no gallop and no friction rub.   No murmur heard. +1 pitting R>L LE edema. No calf TTP. riht medial thigh and leg incisions x 2 healing well. No signs of secondary infection  Pulmonary/Chest: Effort normal and breath sounds normal. He has no wheezes. He has no rales. He exhibits no tenderness.  Sternal incision healing well and without signs of wounfd dehiscence, d/c or bleeding  Abdominal: Soft. Bowel sounds are normal. He exhibits no distension, no abdominal bruit, no pulsatile midline mass and no mass. There is no tenderness. There is no rebound and no guarding.  Lymphadenopathy:    He has no cervical adenopathy.  Neurological: He is alert and oriented to person, place, and time. He has normal reflexes.  Skin: Skin is warm and dry. No rash noted.  Psychiatric: He has a normal mood and affect. His behavior is normal. Judgment and thought content normal.     Labs reviewed: Appointment on 02/28/2015  Component Date Value Ref Range Status  . Glucose 02/28/2015 64* 65 - 99 mg/dL Final   Comment: Specimen received in  contact with cells. No visible hemolysis present. However GLUC may be decreased and K increased. Clinical correlation indicated. **Verified by repeat analysis**   . BUN 02/28/2015 34* 8 - 27 mg/dL Final  . Creatinine, Ser 02/28/2015 1.68* 0.76 - 1.27 mg/dL Final  . GFR calc non Af Amer 02/28/2015 41* >59 mL/min/1.73 Final  . GFR calc Af Amer 02/28/2015 48* >59 mL/min/1.73 Final  . BUN/Creatinine Ratio 02/28/2015 20  10 - 22 Final  . Sodium 02/28/2015 141  134 - 144 mmol/L Final  . Potassium 02/28/2015 5.6* 3.5 - 5.2 mmol/L Final   Comment: Specimen received in contact with cells. No visible hemolysis present. However GLUC may be decreased and K increased. Clinical correlation indicated. **Verified by repeat analysis**   . Chloride 02/28/2015 101  96 - 106 mmol/L Final  . CO2 02/28/2015 25  18 - 29 mmol/L Final  . Calcium 02/28/2015 9.2  8.6 - 10.2 mg/dL Final  . WBC 02/28/2015 8.4  3.4 - 10.8 x10E3/uL Final  . RBC 02/28/2015 2.83* 4.14 - 5.80 x10E6/uL Final  . Hemoglobin 02/28/2015 8.6* 12.6 - 17.7 g/dL Final  . Hematocrit 02/28/2015 25.6* 37.5 - 51.0 % Final  . MCV 02/28/2015 91  79 - 97 fL Final  . MCH 02/28/2015 30.4  26.6 - 33.0 pg Final  . MCHC 02/28/2015 33.6  31.5 - 35.7 g/dL Final  . RDW 02/28/2015 13.6  12.3 - 15.4 % Final  . Platelets 02/28/2015 438* 150 - 379 x10E3/uL Final  . Neutrophils 02/28/2015 64   Final  . Lymphs 02/28/2015 23   Final  . Monocytes 02/28/2015 10   Final  . Eos  02/28/2015 2   Final  . Wendi Snipes 02/28/2015 0   Final  . Neutrophils Absolute 02/28/2015 5.3  1.4 - 7.0 x10E3/uL Final  . Lymphocytes Absolute 02/28/2015 1.9  0.7 - 3.1 x10E3/uL Final  . Monocytes Absolute 02/28/2015 0.9  0.1 - 0.9 x10E3/uL Final  . EOS (ABSOLUTE) 02/28/2015 0.2  0.0 - 0.4 x10E3/uL Final  . Basophils Absolute 02/28/2015 0.0  0.0 - 0.2 x10E3/uL Final  . Immature Granulocytes 02/28/2015 1   Final  . Immature Grans (Abs) 02/28/2015 0.0  0.0 - 0.1 x10E3/uL Final  Admission on  02/18/2015, Discharged on 02/22/2015  No results displayed because visit has over 200 results.    Hospital Outpatient Visit on 02/16/2015  Component Date Value Ref Range Status  . FVC-Pre 02/16/2015 3.14   Final  . FVC-%Pred-Pre 02/16/2015 72   Final  . FVC-Post 02/16/2015 3.62   Final  . FVC-%Pred-Post 02/16/2015 83   Final  . FVC-%Change-Post 02/16/2015 15   Final  . FEV1-Pre 02/16/2015 1.86   Final  . FEV1-%Pred-Pre 02/16/2015 55   Final  . FEV1-Post 02/16/2015 2.20   Final  . FEV1-%Pred-Post 02/16/2015 65   Final  . FEV1-%Change-Post 02/16/2015 18   Final  . FEV6-Pre 02/16/2015 2.87   Final  . FEV6-%Pred-Pre 02/16/2015 68   Final  . FEV6-Post 02/16/2015 3.26   Final  . FEV6-%Pred-Post 02/16/2015 77   Final  . FEV6-%Change-Post 02/16/2015 13   Final  . Pre FEV1/FVC ratio 02/16/2015 59   Final  . FEV1FVC-%Pred-Pre 02/16/2015 76   Final  . Post FEV1/FVC ratio 02/16/2015 61   Final  . FEV1FVC-%Change-Post 02/16/2015 2   Final  . Pre FEV6/FVC Ratio 02/16/2015 91   Final  . FEV6FVC-%Pred-Pre 02/16/2015 95   Final  . Post FEV6/FVC ratio 02/16/2015 90   Final  . FEV6FVC-%Pred-Post 02/16/2015 93   Final  . FEV6FVC-%Change-Post 02/16/2015 -1   Final  . FEF 25-75 Pre 02/16/2015 0.78   Final  . FEF2575-%Pred-Pre 02/16/2015 26   Final  . FEF 25-75 Post 02/16/2015 1.43   Final  . FEF2575-%Pred-Post 02/16/2015 48   Final  . FEF2575-%Change-Post 02/16/2015 83   Final  . RV 02/16/2015 3.62   Final  . RV % pred 02/16/2015 141   Final  . TLC 02/16/2015 6.83   Final  . TLC % pred 02/16/2015 89   Final  . DLCO unc 02/16/2015 20.54   Final  . DLCO unc % pred 02/16/2015 56   Final  . DL/VA 02/16/2015 3.13   Final  . DL/VA % pred 02/16/2015 65   Final  Hospital Outpatient Visit on 02/16/2015  Component Date Value Ref Range Status  . Glucose-Capillary 02/16/2015 208* 65 - 99 mg/dL Final  . aPTT 02/16/2015 26  24 - 37 seconds Final  . FIO2 02/16/2015 0.21   Final  . pH, Arterial 02/16/2015  7.429  7.350 - 7.450 Final  . pCO2 arterial 02/16/2015 40.9  35.0 - 45.0 mmHg Final  . pO2, Arterial 02/16/2015 83.6  80.0 - 100.0 mmHg Final  . Bicarbonate 02/16/2015 26.6* 20.0 - 24.0 mEq/L Final  . TCO2 02/16/2015 27.9  0 - 100 mmol/L Final  . Acid-Base Excess 02/16/2015 2.6* 0.0 - 2.0 mmol/L Final  . O2 Saturation 02/16/2015 96.5   Final  . Patient temperature 02/16/2015 98.6   Final  . Allens test (pass/fail) 02/16/2015 PASS  PASS Final  . WBC 02/16/2015 4.2  4.0 - 10.5 K/uL Final  . RBC 02/16/2015 3.76*  4.22 - 5.81 MIL/uL Final  . Hemoglobin 02/16/2015 11.3* 13.0 - 17.0 g/dL Final  . HCT 02/16/2015 33.8* 39.0 - 52.0 % Final  . MCV 02/16/2015 89.9  78.0 - 100.0 fL Final  . MCH 02/16/2015 30.1  26.0 - 34.0 pg Final  . MCHC 02/16/2015 33.4  30.0 - 36.0 g/dL Final  . RDW 02/16/2015 12.2  11.5 - 15.5 % Final  . Platelets 02/16/2015 316  150 - 400 K/uL Final  . Sodium 02/16/2015 138  135 - 145 mmol/L Final  . Potassium 02/16/2015 5.0  3.5 - 5.1 mmol/L Final  . Chloride 02/16/2015 104  101 - 111 mmol/L Final  . CO2 02/16/2015 25  22 - 32 mmol/L Final  . Glucose, Bld 02/16/2015 208* 65 - 99 mg/dL Final  . BUN 02/16/2015 31* 6 - 20 mg/dL Final  . Creatinine, Ser 02/16/2015 1.63* 0.61 - 1.24 mg/dL Final  . Calcium 02/16/2015 9.4  8.9 - 10.3 mg/dL Final  . Total Protein 02/16/2015 6.5  6.5 - 8.1 g/dL Final  . Albumin 02/16/2015 3.5  3.5 - 5.0 g/dL Final  . AST 02/16/2015 26  15 - 41 U/L Final  . ALT 02/16/2015 25  17 - 63 U/L Final  . Alkaline Phosphatase 02/16/2015 75  38 - 126 U/L Final  . Total Bilirubin 02/16/2015 0.3  0.3 - 1.2 mg/dL Final  . GFR calc non Af Amer 02/16/2015 42* >60 mL/min Final  . GFR calc Af Amer 02/16/2015 49* >60 mL/min Final   Comment: (NOTE) The eGFR has been calculated using the CKD EPI equation. This calculation has not been validated in all clinical situations. eGFR's persistently <60 mL/min signify possible Chronic Kidney Disease.   . Anion gap  02/16/2015 9  5 - 15 Final  . Hgb A1c MFr Bld 02/16/2015 8.7* 4.8 - 5.6 % Final   Comment: (NOTE)         Pre-diabetes: 5.7 - 6.4         Diabetes: >6.4         Glycemic control for adults with diabetes: <7.0   . Mean Plasma Glucose 02/16/2015 203   Final   Comment: (NOTE) Performed At: [facility] [place], [place] [id number] Lindon Romp MD [id number]   . Prothrombin Time 02/16/2015 13.2  11.6 - 15.2 seconds Final  . INR 02/16/2015 0.98  0.00 - 1.49 Final  . ABO/RH(D) 02/16/2015 A POS   Final  . Antibody Screen 02/16/2015 NEG   Final  . Sample Expiration 02/16/2015 02/21/2015   Final  . Extend sample reason 02/16/2015 NO TRANSFUSIONS OR PREGNANCY IN THE PAST 3 MONTHS   Final  . Unit Number 02/16/2015 [id number]   Final  . Blood Component Type 02/16/2015 RED CELLS,LR   Final  . Unit division 02/16/2015 00   Final  . Status of Unit 02/16/2015 ISSUED,FINAL   Final  . Transfusion Status 02/16/2015 OK TO TRANSFUSE   Final  . Crossmatch Result 02/16/2015 Compatible   Final  . Unit Number 02/16/2015 [id number]   Final  . Blood Component Type 02/16/2015 RED CELLS,LR   Final  . Unit division 02/16/2015 00   Final  . Status of Unit 02/16/2015 ISSUED,FINAL   Final  . Transfusion Status 02/16/2015 OK TO TRANSFUSE   Final  . Crossmatch Result 02/16/2015 Compatible   Final  . Unit Number 02/16/2015 [id number]   Final  . Blood Component Type 02/16/2015 RED CELLS,LR   Final  . Unit division  02/16/2015 00   Final  . Status of Unit 02/16/2015 REL FROM [facility]   Final  . Transfusion Status 02/16/2015 OK TO TRANSFUSE   Final  . Crossmatch Result 02/16/2015 Compatible   Final  . Unit Number 02/16/2015 [id number]   Final  . Blood Component Type 02/16/2015 RED CELLS,LR   Final  . Unit division 02/16/2015 00   Final  . Status of Unit 02/16/2015 REL FROM [facility]   Final  . Transfusion Status 02/16/2015 OK TO TRANSFUSE   Final  . Crossmatch Result 02/16/2015  Compatible   Final  . Color, Urine 02/16/2015 YELLOW  YELLOW Final  . APPearance 02/16/2015 CLEAR  CLEAR Final  . Specific Gravity, Urine 02/16/2015 1.015  1.005 - 1.030 Final  . pH 02/16/2015 6.5  5.0 - 8.0 Final  . Glucose, UA 02/16/2015 NEGATIVE  NEGATIVE mg/dL Final  . Hgb urine dipstick 02/16/2015 NEGATIVE  NEGATIVE Final  . Bilirubin Urine 02/16/2015 NEGATIVE  NEGATIVE Final  . Ketones, ur 02/16/2015 NEGATIVE  NEGATIVE mg/dL Final  . Protein, ur 02/16/2015 >300* NEGATIVE mg/dL Final  . Nitrite 02/16/2015 NEGATIVE  NEGATIVE Final  . Leukocytes, UA 02/16/2015 NEGATIVE  NEGATIVE Final  . MRSA, PCR 02/16/2015 NEGATIVE  NEGATIVE Final  . Staphylococcus aureus 02/16/2015 NEGATIVE  NEGATIVE Final   Comment:        The Xpert SA Assay (FDA approved for NASAL specimens in patients over 21 years of age), is one component of a comprehensive surveillance program.  Test performance has been validated by [facility] for patients greater than or equal to 57 year old. It is not intended to diagnose infection nor to guide or monitor treatment.   . Squamous Epithelial / LPF 02/16/2015 0-5* NONE SEEN Final  . WBC, UA 02/16/2015 0-5  0 - 5 WBC/hpf Final  . RBC / HPF 02/16/2015 0-5  0 - 5 RBC/hpf Final  . Bacteria, UA 02/16/2015 RARE* NONE SEEN Final  . Casts 02/16/2015 HYALINE CASTS* NEGATIVE Final  . ABO/RH(D) 02/16/2015 A POS   Final  Admission on 02/09/2015, Discharged on 02/10/2015  Component Date Value Ref Range Status  . Sodium 02/09/2015 138  135 - 145 mmol/L Final  . Potassium 02/09/2015 5.3* 3.5 - 5.1 mmol/L Final  . Chloride 02/09/2015 107  101 - 111 mmol/L Final  . CO2 02/09/2015 26  22 - 32 mmol/L Final  . Glucose, Bld 02/09/2015 284* 65 - 99 mg/dL Final  . BUN 02/09/2015 35* 6 - 20 mg/dL Final  . Creatinine, Ser 02/09/2015 1.80* 0.61 - 1.24 mg/dL Final  . Calcium 02/09/2015 9.3  8.9 - 10.3 mg/dL Final  . GFR calc non Af Amer 02/09/2015 38* >60 mL/min Final  . GFR calc Af  Amer 02/09/2015 44* >60 mL/min Final   Comment: (NOTE) The eGFR has been calculated using the CKD EPI equation. This calculation has not been validated in all clinical situations. eGFR's persistently <60 mL/min signify possible Chronic Kidney Disease.   . Anion gap 02/09/2015 5  5 - 15 Final  . Glucose-Capillary 02/09/2015 255* 65 - 99 mg/dL Final  . Glucose-Capillary 02/09/2015 236* 65 - 99 mg/dL Final  . Sodium 02/10/2015 140  135 - 145 mmol/L Final  . Potassium 02/10/2015 4.1  3.5 - 5.1 mmol/L Final   DELTA CHECK NOTED  . Chloride 02/10/2015 108  101 - 111 mmol/L Final  . CO2 02/10/2015 25  22 - 32 mmol/L Final  . Glucose, Bld 02/10/2015 147* 65 - 99 mg/dL Final  .  BUN 02/10/2015 24* 6 - 20 mg/dL Final  . Creatinine, Ser 02/10/2015 1.34* 0.61 - 1.24 mg/dL Final  . Calcium 02/10/2015 9.0  8.9 - 10.3 mg/dL Final  . GFR calc non Af Amer 02/10/2015 54* >60 mL/min Final  . GFR calc Af Amer 02/10/2015 >60  >60 mL/min Final   Comment: (NOTE) The eGFR has been calculated using the CKD EPI equation. This calculation has not been validated in all clinical situations. eGFR's persistently <60 mL/min signify possible Chronic Kidney Disease.   . Anion gap 02/10/2015 7  5 - 15 Final  . WBC 02/10/2015 4.6  4.0 - 10.5 K/uL Final  . RBC 02/10/2015 3.41* 4.22 - 5.81 MIL/uL Final  . Hemoglobin 02/10/2015 10.5* 13.0 - 17.0 g/dL Final  . HCT 02/10/2015 31.0* 39.0 - 52.0 % Final  . MCV 02/10/2015 90.9  78.0 - 100.0 fL Final  . MCH 02/10/2015 30.8  26.0 - 34.0 pg Final  . MCHC 02/10/2015 33.9  30.0 - 36.0 g/dL Final  . RDW 02/10/2015 12.4  11.5 - 15.5 % Final  . Platelets 02/10/2015 292  150 - 400 K/uL Final  . Glucose-Capillary 02/09/2015 235* 65 - 99 mg/dL Final  . Glucose-Capillary 02/10/2015 76  65 - 99 mg/dL Final  . Total Protein 02/10/2015 5.6* 6.5 - 8.1 g/dL Final  . Albumin 02/10/2015 3.1* 3.5 - 5.0 g/dL Final  . AST 02/10/2015 22  15 - 41 U/L Final  . ALT 02/10/2015 24  17 - 63 U/L  Final  . Alkaline Phosphatase 02/10/2015 70  38 - 126 U/L Final  . Total Bilirubin 02/10/2015 0.3  0.3 - 1.2 mg/dL Final  . Bilirubin, Direct 02/10/2015 <0.1* 0.1 - 0.5 mg/dL Final  . Indirect Bilirubin 02/10/2015 NOT CALCULATED  0.3 - 0.9 mg/dL Final  . Magnesium 02/10/2015 2.1  1.7 - 2.4 mg/dL Final  Office Visit on 02/03/2015  Component Date Value Ref Range Status  . Sodium 02/03/2015 136  135 - 146 mmol/L Final  . Potassium 02/03/2015 4.6  3.5 - 5.3 mmol/L Final  . Chloride 02/03/2015 101  98 - 110 mmol/L Final  . CO2 02/03/2015 28  20 - 31 mmol/L Final  . Glucose, Bld 02/03/2015 196* 65 - 99 mg/dL Final  . BUN 02/03/2015 41* 7 - 25 mg/dL Final  . Creat 02/03/2015 1.79* 0.70 - 1.25 mg/dL Final  . Calcium 02/03/2015 9.1  8.6 - 10.3 mg/dL Final  . Prothrombin Time 02/03/2015 12.6  11.6 - 15.2 seconds Final  . INR 02/03/2015 0.93  <1.50 Final   Comment: The INR is of principal utility in following patients on stable doses of oral anticoagulants.  The therapeutic range is generally 2.0 to 3.0, but may be 3.0 to 4.0 in patients with mechanical cardiac valves, recurrent embolisms and antiphospholipid antibodies (including lupus inhibitors).   . WBC 02/03/2015 4.1  4.0 - 10.5 K/uL Final  . RBC 02/03/2015 3.41* 4.22 - 5.81 MIL/uL Final  . Hemoglobin 02/03/2015 10.6* 13.0 - 17.0 g/dL Final  . HCT 02/03/2015 30.2* 39.0 - 52.0 % Final  . MCV 02/03/2015 88.6  78.0 - 100.0 fL Final  . MCH 02/03/2015 31.1  26.0 - 34.0 pg Final  . MCHC 02/03/2015 35.1  30.0 - 36.0 g/dL Final  . RDW 02/03/2015 13.3  11.5 - 15.5 % Final  . Platelets 02/03/2015 250  150 - 400 K/uL Final  . MPV 02/03/2015 8.7  8.6 - 12.4 fL Final  . Neutrophils Relative % 02/03/2015 61  43 - 77 % Final  . Neutro Abs 02/03/2015 2.5  1.7 - 7.7 K/uL Final  . Lymphocytes Relative 02/03/2015 25  12 - 46 % Final  . Lymphs Abs 02/03/2015 1.0  0.7 - 4.0 K/uL Final  . Monocytes Relative 02/03/2015 11  3 - 12 % Final  . Monocytes  Absolute 02/03/2015 0.5  0.1 - 1.0 K/uL Final  . Eosinophils Relative 02/03/2015 3  0 - 5 % Final  . Eosinophils Absolute 02/03/2015 0.1  0.0 - 0.7 K/uL Final  . Basophils Relative 02/03/2015 0  0 - 1 % Final  . Basophils Absolute 02/03/2015 0.0  0.0 - 0.1 K/uL Final  . Smear Review 02/03/2015 Criteria for review not met   Final  Lab on 01/24/2015  Component Date Value Ref Range Status  . Sodium 01/24/2015 139  135 - 146 mmol/L Final  . Potassium 01/24/2015 4.7  3.5 - 5.3 mmol/L Final  . Chloride 01/24/2015 104  98 - 110 mmol/L Final  . CO2 01/24/2015 28  20 - 31 mmol/L Final  . Glucose, Bld 01/24/2015 49* 65 - 99 mg/dL Final  . BUN 12/15/1592 37* 7 - 25 mg/dL Final  . Creat 58/59/2924 1.57* 0.70 - 1.25 mg/dL Final  . Calcium 46/28/6381 9.6  8.6 - 10.3 mg/dL Final  Office Visit on 01/17/2015  Component Date Value Ref Range Status  . Sodium 01/17/2015 138  135 - 146 mmol/L Final  . Potassium 01/17/2015 4.9  3.5 - 5.3 mmol/L Final  . Chloride 01/17/2015 105  98 - 110 mmol/L Final  . CO2 01/17/2015 25  20 - 31 mmol/L Final  . Glucose, Bld 01/17/2015 145* 65 - 99 mg/dL Final  . BUN 77/12/6577 48* 7 - 25 mg/dL Final  . Creat 03/83/3383 1.82* 0.70 - 1.25 mg/dL Final  . Calcium 29/19/1660 9.6  8.6 - 10.3 mg/dL Final  Lab on 60/05/5995  Component Date Value Ref Range Status  . Sodium 01/13/2015 138  135 - 146 mmol/L Final  . Potassium 01/13/2015 4.7  3.5 - 5.3 mmol/L Final  . Chloride 01/13/2015 104  98 - 110 mmol/L Final  . CO2 01/13/2015 25  20 - 31 mmol/L Final  . Glucose, Bld 01/13/2015 155* 65 - 99 mg/dL Final  . BUN 74/14/2395 54* 7 - 25 mg/dL Final  . Creat 32/03/3341 2.26* 0.70 - 1.25 mg/dL Final  . Calcium 56/86/1683 9.3  8.6 - 10.3 mg/dL Final  Appointment on 72/90/2111  Component Date Value Ref Range Status  . Rest HR 01/13/2015 68   Final  . Rest BP 01/13/2015 137/81   Final  . Peak HR 01/13/2015 82   Final  . Peak BP 01/13/2015 141/76   Final  . LV Systolic Volume  55/20/8022 131   Final  . TID 01/13/2015 1.08   Final  . LV Diastolic Volume 01/13/2015 186   Final  . LHR 01/13/2015 0.29   Final  . SSS 01/13/2015 18   Final  . SRS 01/13/2015 15   Final  . SDS 01/13/2015 3   Final  There may be more visits with results that are not included.  Dg Chest 2 View  02/21/2015  CLINICAL DATA:  Bypass surgery. EXAM: CHEST  2 VIEW COMPARISON:  02/20/2015 FINDINGS: The cardiac silhouette, mediastinal and hilar contours are stable. The right IJ Cordis is been removed. There are persistent small bilateral pleural effusions with overlying bibasilar atelectasis. No pulmonary edema. IMPRESSION: Removal of right IJ Cordis. Persistent small effusions  and bibasilar atelectasis. No pneumothorax. Electronically Signed   By: Marijo Sanes M.D.   On: 02/21/2015 08:07   Dg Chest 2 View  02/16/2015  CLINICAL DATA:  Preop for CABG and transesophageal echocardiogram EXAM: CHEST  2 VIEW COMPARISON:  12/29/2014 FINDINGS: Cardiomediastinal silhouette is stable. No acute infiltrate or pleural effusion. No pulmonary edema. Mild degenerative changes mid and lower thoracic spine. IMPRESSION: No active disease.  Mild degenerative changes thoracic spine. Electronically Signed   By: Lahoma Crocker M.D.   On: 02/16/2015 09:24   US Renal  02/02/2015  CLINICAL DATA:  Stage III chronic renal disease EXAM: RENAL / URINARY TRACT ULTRASOUND COMPLETE COMPARISON:  None. FINDINGS: Right Kidney: Length: 11.2 cm. Echogenicity and renal cortical thickness are within normal limits. No mass, perinephric fluid, or hydronephrosis visualized. No sonographically demonstrable calculus or ureterectasis. Left Kidney: Length: 11.3 cm. Echogenicity and renal cortical thickness are within normal limits. No mass, perinephric fluid, or hydronephrosis visualized. No sonographically demonstrable calculus or ureterectasis. Bladder: Appears normal for degree of bladder distention. IMPRESSION: Study within normal limits. Electronically  Signed   By: Lowella Grip III M.D.   On: 02/02/2015 14:36   Dg Chest Port 1 View  02/20/2015  CLINICAL DATA:  65 year old male with atelectasis EXAM: PORTABLE CHEST 1 VIEW COMPARISON:  Prior chest x-ray 02/19/2015 FINDINGS: Swan-Ganz catheter, mediastinal and thoracic drains have all been removed. The right IJ vascular sheath remains in place. Patient is status post median sternotomy. Persistent small bilateral pleural effusions and associated bibasilar atelectasis. Overall, inspiratory volumes are improved. No pulmonary edema or pneumothorax. No acute osseous abnormality. IMPRESSION: 1. Slightly improved inspiratory volumes with persistent but decreasing bibasilar atelectasis and small residual effusions. 2. Interval removal of Swan-Ganz catheter, mediastinal and thoracic drains. No evidence of pneumothorax. Electronically Signed   By: Jacqulynn Cadet M.D.   On: 02/20/2015 08:52   Dg Chest Port 1 View  02/19/2015  CLINICAL DATA:  Coronary artery disease EXAM: PORTABLE CHEST 1 VIEW COMPARISON:  Yesterday FINDINGS: Endotracheal and NG tubes removed. Chest tubes remain in place. Low volumes. Bibasilar atelectasis. No pulmonary edema are pneumothorax. Stable Swan-Ganz catheter. IMPRESSION: Extubated. Bibasilar atelectasis No pneumothorax or pulmonary edema. Electronically Signed   By: Marybelle Killings M.D.   On: 02/19/2015 08:57   Dg Chest Port 1 View  02/18/2015  CLINICAL DATA:  Status post CABG x4. EXAM: PORTABLE CHEST 1 VIEW COMPARISON:  Chest x-ray dated 02/16/2015. FINDINGS: Median sternotomy wires in place related to the given history of CABG. Endotracheal tube appears well positioned with tip just above the level of the carina. Enteric tube passes below the diaphragm. Swan-Ganz catheter is in place with tip near the midline. Bibasilar chest tubes in place. At least 1 mediastinal drain identified. Study is hypoinspiratory with crowding of the perihilar and bibasilar bronchovascular markings. Suspect mild  atelectasis at each lung base. Probable small pleural effusion at the left lung base. Lungs otherwise clear. No pneumothorax seen. IMPRESSION: Postsurgical changes, with associated tubes and lines, as detailed above. Probable mild bibasilar atelectasis and small left pleural effusion. Lungs otherwise unremarkable. No postsurgical complicating feature identified. Electronically Signed   By: Franki Cabot M.D.   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 Education handout given   Cordella Register. Perlie Gold  [facility] [address] [place], [place] [address] [phone] Cell (Monday-Friday 8 AM - 5 PM) [phone] After 5 PM  and follow prompts

## 2015-03-02 NOTE — Patient Instructions (Signed)
Continue current medications as ordered  Follow up with specialists as scheduled  Follow up in 3 mos for routine visit. Fasting labs prior to appt     Peripheral Vascular Disease Peripheral vascular disease (PVD) is a disease of the blood vessels that are not part of your heart and brain. A simple term for PVD is poor circulation. In most cases, PVD narrows the blood vessels that carry blood from your heart to the rest of your body. This can result in a decreased supply of blood to your arms, legs, and internal organs, like your stomach or kidneys. However, it most often affects a person's lower legs and feet. There are two types of PVD.  Organic PVD. This is the more common type. It is caused by damage to the structure of blood vessels.  Functional PVD. This is caused by conditions that make blood vessels contract and tighten (spasm). Without treatment, PVD tends to get worse over time. PVD can also lead to acute ischemic limb. This is when an arm or limb suddenly has trouble getting enough blood. This is a medical emergency.  HOME CARE  Take medicines only as told by your doctor.  Do not use any tobacco products, including cigarettes, chewing tobacco, or electronic cigarettes. If you need help quitting, ask your doctor.  Lose weight if you are overweight, and maintain a healthy weight as told by your doctor.  Eat a diet that is low in fat and cholesterol. If you need help, ask your doctor.  Exercise regularly. Ask your doctor for some good activities for you.  Take good care of your feet.  Wear comfortable shoes that fit well.  Check your feet often for any cuts or sores. GET HELP IF:  You have cramps in your legs while walking.  You have leg pain when you are at rest.  You have coldness in a leg or foot.  Your skin changes.  You are unable to get or have an erection (erectile dysfunction).  You have cuts or sores on your feet that are not healing. GET HELP RIGHT  AWAY IF:  Your arm or leg turns cold and blue.  Your arms or legs become red, warm, swollen, painful, or numb.  You have chest pain or trouble breathing.  You suddenly have weakness in your face, arm, or leg.  You become very confused or you cannot speak.  You suddenly have a very bad headache.  You suddenly cannot see.   This information is not intended to replace advice given to you by your health care provider. Make sure you discuss any questions you have with your health care provider.   Document Released: 04/25/2009 Document Revised: 02/19/2014 Document Reviewed: 07/09/2013 Elsevier Interactive Patient Education [employer].

## 2015-03-03 ENCOUNTER — Other Ambulatory Visit: Payer: Self-pay | Admitting: Nephrology

## 2015-03-03 DIAGNOSIS — I10 Essential (primary) hypertension: Secondary | ICD-10-CM

## 2015-03-03 DIAGNOSIS — D472 Monoclonal gammopathy: Secondary | ICD-10-CM

## 2015-03-03 DIAGNOSIS — E1322 Other specified diabetes mellitus with diabetic chronic kidney disease: Secondary | ICD-10-CM

## 2015-03-03 DIAGNOSIS — R809 Proteinuria, unspecified: Secondary | ICD-10-CM

## 2015-03-03 DIAGNOSIS — N183 Chronic kidney disease, stage 3 unspecified: Secondary | ICD-10-CM

## 2015-03-08 ENCOUNTER — Other Ambulatory Visit: Payer: Medicare Other

## 2015-03-18 ENCOUNTER — Ambulatory Visit (HOSPITAL_COMMUNITY)
Admission: RE | Admit: 2015-03-18 | Discharge: 2015-03-18 | Disposition: A | Discharge status: Home / Self Care | Payer: Medicare Other | Source: Ambulatory Visit | Attending: Nephrology | Admitting: Nephrology

## 2015-03-18 DIAGNOSIS — D472 Monoclonal gammopathy: Secondary | ICD-10-CM

## 2015-03-18 DIAGNOSIS — I129 Hypertensive chronic kidney disease with stage 1 through stage 4 chronic kidney disease, or unspecified chronic kidney disease: Secondary | ICD-10-CM | POA: Insufficient documentation

## 2015-03-18 DIAGNOSIS — N183 Chronic kidney disease, stage 3 unspecified: Secondary | ICD-10-CM

## 2015-03-18 DIAGNOSIS — R809 Proteinuria, unspecified: Secondary | ICD-10-CM

## 2015-03-18 DIAGNOSIS — I10 Essential (primary) hypertension: Secondary | ICD-10-CM

## 2015-03-18 DIAGNOSIS — E1322 Other specified diabetes mellitus with diabetic chronic kidney disease: Secondary | ICD-10-CM | POA: Insufficient documentation

## 2015-03-22 ENCOUNTER — Other Ambulatory Visit: Payer: Self-pay | Admitting: Surgery

## 2015-03-22 DIAGNOSIS — Z951 Presence of aortocoronary bypass graft: Secondary | ICD-10-CM

## 2015-03-23 ENCOUNTER — Telehealth: Payer: Self-pay | Admitting: Cardiology

## 2015-03-23 ENCOUNTER — Encounter: Payer: Self-pay | Admitting: Surgery

## 2015-03-23 ENCOUNTER — Ambulatory Visit (INDEPENDENT_AMBULATORY_CARE_PROVIDER_SITE_OTHER): Payer: Self-pay | Admitting: Surgery

## 2015-03-23 ENCOUNTER — Ambulatory Visit
Admission: RE | Admit: 2015-03-23 | Discharge: 2015-03-23 | Disposition: A | Discharge status: Home / Self Care | Payer: Medicare Other | Source: Ambulatory Visit | Attending: Surgery | Admitting: Surgery

## 2015-03-23 VITALS — BP 141/85 | HR 90 | Resp 16 | Ht 73.0 in | Wt 247.0 lb

## 2015-03-23 DIAGNOSIS — Z951 Presence of aortocoronary bypass graft: Secondary | ICD-10-CM

## 2015-03-23 DIAGNOSIS — J90 Pleural effusion, not elsewhere classified: Secondary | ICD-10-CM | POA: Diagnosis not present

## 2015-03-23 DIAGNOSIS — I2510 Atherosclerotic heart disease of native coronary artery without angina pectoris: Secondary | ICD-10-CM

## 2015-03-23 NOTE — Telephone Encounter (Signed)
New Message  RN from [place] has questions concerning referral from Dr Marlou Porch for cardiac rehab. Please call back and discuss.

## 2015-03-23 NOTE — Telephone Encounter (Signed)
[facility] Cardiac Re[facility] called to confirm patient is OK to start exercise.  He is supposed to start Cardiac Rehab March 14 but next OV with [name] is March 15.  A form stating patient is OK to start is being faxed to [facility].

## 2015-03-24 ENCOUNTER — Encounter: Payer: Self-pay | Admitting: Surgery

## 2015-03-24 NOTE — Progress Notes (Signed)
HPI:  Patient returns for routine postoperative follow-up having undergone CABG x 4 on 02/18/2015. The patient's early postoperative recovery while in the hospital was notable for an uncomplicated postop course. Since hospital discharge the patient reports that he has been feeling well. He is walking without chest pain or shortness of breath and his stamina continues to improve.   Current Outpatient Prescriptions  Medication Sig Dispense Refill  . albuterol (PROVENTIL) (2.5 MG/3ML) 0.083% nebulizer solution Take 3 mLs (2.5 mg total) by nebulization every 6 (six) hours as needed for wheezing or shortness of breath. 3 mL 10  . amLODipine (NORVASC) 10 MG tablet Take 1 tablet (10 mg total) by mouth daily. 30 tablet 6  . aspirin EC 325 MG EC tablet Take 1 tablet (325 mg total) by mouth daily.    Marland Kitchen atorvastatin (LIPITOR) 40 MG tablet Take 1 tablet (40 mg total) by mouth every evening. 30 tablet 6  . carvedilol (COREG) 12.5 MG tablet Take 1 tablet (12.5 mg total) by mouth 2 (two) times daily with a meal. 60 tablet 6  . Cholecalciferol ([employer] VITAMIN D3) 1000 UNITS capsule Take 2 capsules (2,000 Units total) by mouth daily. 60 capsule 3  . Coenzyme Q10 (CO Q 10 PO) Take 1 tablet by mouth daily.    . ferrous sulfate 325 (65 FE) MG tablet Take 1 tablet (325 mg total) by mouth daily with breakfast. For one month then stop.  0  . Fluticasone-Salmeterol (ADVAIR DISKUS) 100-50 MCG/DOSE AEPB Inhale 1 puff into the lungs 2 (two) times daily.    . furosemide (LASIX) 40 MG tablet Take 1 tablet (40 mg total) by mouth daily. 30 tablet 0  . hydrALAZINE (APRESOLINE) 25 MG tablet Take 1 tablet (25 mg total) by mouth 3 (three) times daily. 180 tablet 5  . insulin detemir (LEVEMIR) 100 UNIT/ML injection Inject 0.5 mLs (50 Units total) into the skin 2 (two) times daily. 30 mL 11  . linagliptin (TRADJENTA) 5 MG TABS tablet Take 5 mg by mouth daily.    Marland Kitchen PROAIR HFA 108 (90 BASE) MCG/ACT inhaler INHALE TWO PUFFS BY  MOUTH EVERY 6 HOURS AS NEEDED FOR SHORTNESS OF BREATH 27 each 3  . terbinafine (LAMISIL) 1 % cream Apply 1 application topically daily as needed (foot).     . montelukast (SINGULAIR) 10 MG tablet Take 10 mg by mouth at bedtime.     No current facility-administered medications for this visit.    Physical Exam: BP 141/85 mmHg  Pulse 90  Resp 16  Ht 6\' 1"  (1.854 m)  Wt 247 lb (112.038 kg)  BMI 32.59 kg/m2  SpO2 98% He looks well. Lung exam is clear. Cardiac exam shows a regular rate and rhythm with normal heart sounds. Chest incision is healing well and sternum is stable. The leg incisions are healing well and there is no peripheral edema.    Diagnostic Tests:  CLINICAL DATA: Status post CABG on February 18, 2015, currently asymptomatic, follow-up study.  EXAM: CHEST 2 VIEW  COMPARISON: PA and lateral chest x-ray of February 21, 2015  FINDINGS: The lungs are adequately inflated and clear. The small bilateral pleural effusions and posterior basilar atelectatic changes have nearly totally resolved. The heart and pulmonary vascularity are normal. The mediastinum is normal in width. The sternal wires are intact. The bony thorax exhibits no acute abnormality.  IMPRESSION: Marked improvement in the appearance of both lung bases. A trace of pleural fluid persists blunting the posterior costophrenic angles.  There is no pulmonary vascular congestion.   Electronically Signed  By: David Martinique M.D.  On: 03/23/2015 12:24  Impression: Overall I think he is doing well. I encouraged him to continue walking. He is planning to participate in cardiac rehab. I told him he could drive his car but should not lift anything heavier than 10 lbs for three months postop.   Plan:  He will continue follow up with [name] and Dr. Eulas Post.   [name], MD [facility] [phone]

## 2015-03-29 ENCOUNTER — Telehealth: Payer: Self-pay

## 2015-03-29 NOTE — Telephone Encounter (Signed)
Sign ref for [facility] cardiac rehab placed in MR nurse fax box

## 2015-03-30 NOTE — Telephone Encounter (Signed)
Form signed and taken to MR to be faxed 2/14 according to documentation.

## 2015-03-31 DIAGNOSIS — R809 Proteinuria, unspecified: Secondary | ICD-10-CM | POA: Diagnosis not present

## 2015-03-31 DIAGNOSIS — D472 Monoclonal gammopathy: Secondary | ICD-10-CM | POA: Diagnosis not present

## 2015-03-31 DIAGNOSIS — E1129 Type 2 diabetes mellitus with other diabetic kidney complication: Secondary | ICD-10-CM | POA: Diagnosis not present

## 2015-03-31 DIAGNOSIS — I10 Essential (primary) hypertension: Secondary | ICD-10-CM | POA: Diagnosis not present

## 2015-03-31 DIAGNOSIS — N183 Chronic kidney disease, stage 3 (moderate): Secondary | ICD-10-CM | POA: Diagnosis not present

## 2015-04-05 ENCOUNTER — Ambulatory Visit (INDEPENDENT_AMBULATORY_CARE_PROVIDER_SITE_OTHER): Payer: Medicare Other | Admitting: Cardiology

## 2015-04-05 ENCOUNTER — Encounter: Payer: Self-pay | Admitting: Cardiology

## 2015-04-05 VITALS — BP 142/70 | HR 82 | Ht 73.0 in | Wt 240.0 lb

## 2015-04-05 DIAGNOSIS — I110 Hypertensive heart disease with heart failure: Secondary | ICD-10-CM

## 2015-04-05 DIAGNOSIS — I10 Essential (primary) hypertension: Secondary | ICD-10-CM

## 2015-04-05 DIAGNOSIS — I255 Ischemic cardiomyopathy: Secondary | ICD-10-CM | POA: Diagnosis not present

## 2015-04-05 DIAGNOSIS — I2510 Atherosclerotic heart disease of native coronary artery without angina pectoris: Secondary | ICD-10-CM | POA: Diagnosis not present

## 2015-04-05 DIAGNOSIS — I5042 Chronic combined systolic (congestive) and diastolic (congestive) heart failure: Secondary | ICD-10-CM | POA: Diagnosis not present

## 2015-04-05 DIAGNOSIS — I2583 Coronary atherosclerosis due to lipid rich plaque: Secondary | ICD-10-CM

## 2015-04-05 NOTE — Progress Notes (Signed)
Cardiology Office Note   Date:  04/05/2015   ID:  Henry Carroll, DOB 11-17-1950, MRN [id number]  PCP:  Gildardo Cranker, DO  Cardiologist:   Candee Furbish, MD       History of Present Illness: Henry Carroll is a 65 y.o. 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 Mild to moderate MR. Prior nuclear stress test was intermediate risk showing a medium-sized severe basal to mid inferior inferolateral perfusion defect. EF on nuclear stress was 30%. His subscale cardiac catheterization demonstrated triple-vessel CAD.  He had some minor hyperkalemia during hospital stay.  Overall he is doing well postoperatively. He is having some minor shortness of breath with exertional activity which is to be expected. He's not requiring any oxycodone. 6 mild Tylenol at times. Ambulating well. He is eager to go to a convention in [place]. I think this is reasonable.     Past Medical History  Diagnosis Date  . Diabetes mellitus   . Asthma   . Hypertension   . Anemia   . Anxiety   . Hyperlipidemia   . CKD (chronic kidney disease), stage III   . Chronic combined systolic (congestive) and diastolic (congestive) heart failure ([place])     a. 12/31/14: 2D ECHO: EF 40-45%, HK of inf myocardium, G1DD, mod MR  . Obesity     a. BMI 33  . Coronary artery disease     a. LHC 01/2015 - triple vessel CAD (mod oLM, mLAD, severe mRCA, intermediate branch stenosis, CTO of mCx). Plan CABG 02/2015.  Marland Kitchen NSVT (nonsustained ventricular tachycardia) (HCC)     a. 9 beats during 01/2015 adm. BB titrated.  . Mitral regurgitation     a. Mild-mod by echo 12/2014.  Marland Kitchen Myocardial infarction [facility])     pt. states per [name] he has in the past.  . 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 Tonsillectomy  1962  . Hernia repair  0000000    Umbilical  . Spine surgery  2006    L4 & L5  . Colon surgery  2011    Colonoscopy  . Cardiac catheterization N/A 02/09/2015    Procedure: Left Heart Cath and Coronary Angiography;  Surgeon: Burnell Blanks, MD;  Location: [facility];  Service: Cardiovascular;  Laterality: N/A;  . Coronary artery bypass graft N/A 02/18/2015    Procedure: CORONARY ARTERY BYPASS GRAFTING (CABG) x  four, using bilateral internal mammary arteries and right leg greater saphenous vein harvested endoscopically;  Surgeon: [name], MD;  Location: [facility];  Service: Open Heart Surgery;  Laterality: N/A;  . Tee without cardioversion N/A 02/18/2015    Procedure: TRANSESOPHAGEAL ECHOCARDIOGRAM (TEE);  Surgeon: [name], MD;  Location: [facility];  Service: Open Heart Surgery;  Laterality: N/A;     Current Outpatient Prescriptions  Medication Sig Dispense Refill  . albuterol (PROVENTIL) (2.5 MG/3ML) 0.083% nebulizer solution Take 3 mLs (2.5 mg total) by nebulization every 6 (six) hours as needed for wheezing or shortness of breath. 3 mL 10  . amLODipine (NORVASC) 10 MG tablet Take 1 tablet (10 mg total) by mouth daily. 30 tablet 6  . aspirin EC  325 MG EC tablet Take 1 tablet (325 mg total) by mouth daily.    Marland Kitchen atorvastatin (LIPITOR) 40 MG tablet Take 1 tablet (40 mg total) by mouth every evening. 30 tablet 6  . benazepril (LOTENSIN) 20 MG tablet Take 20 mg by mouth daily.    . carvedilol (COREG) 12.5 MG tablet Take 1 tablet (12.5 mg total) by mouth 2 (two) times daily with a meal. 60 tablet 6  . Cholecalciferol (CVS VITAMIN D3) 1000 UNITS capsule Take 2 capsules (2,000 Units total) by mouth daily. 60 capsule 3  . Coenzyme Q10 (CO Q 10 PO) Take 1 tablet by mouth daily.    . Fluticasone-Salmeterol (ADVAIR DISKUS) 100-50 MCG/DOSE AEPB Inhale 1 puff into the lungs 2 (two) times daily.     . furosemide (LASIX) 40 MG tablet Take 1 tablet (40 mg total) by mouth daily. 30 tablet 0  . hydrALAZINE (APRESOLINE) 25 MG tablet Take 1 tablet (25 mg total) by mouth 3 (three) times daily. 180 tablet 5  . insulin detemir (LEVEMIR) 100 UNIT/ML injection Inject 0.5 mLs (50 Units total) into the skin 2 (two) times daily. 30 mL 11  . linagliptin (TRADJENTA) 5 MG TABS tablet Take 5 mg by mouth daily.    . montelukast (SINGULAIR) 10 MG tablet Take 10 mg by mouth at bedtime.    Marland Kitchen PROAIR HFA 108 (90 BASE) MCG/ACT inhaler INHALE TWO PUFFS BY MOUTH EVERY 6 HOURS AS NEEDED FOR SHORTNESS OF BREATH 27 each 3  . terbinafine (LAMISIL) 1 % cream Apply 1 application topically daily as needed (foot).      No current facility-administered medications for this visit.    Allergies:   Review of patient's allergies indicates no known allergies.    Social History:  The patient  reports that he quit smoking about 3 months ago. His smoking use included Cigars. He has never used smokeless tobacco. He reports that he drinks alcohol. He reports that he does not use illicit drugs.   Family History:  The patient's family history includes Diabetes in his mother; Heart disease in his father and mother; Hypertension in his father; Kidney disease in his daughter; Stroke in his sister; Sudden death in his daughter. There is no history of Heart attack.    ROS:  Please see the history of present illness.   Otherwise, review of systems are positive for none.   All other systems are reviewed and negative.    PHYSICAL EXAM: VS:  BP 142/70 mmHg  Pulse 82  Ht 6\' 1"  (1.854 m)  Wt 240 lb (108.863 kg)  BMI 31.67 kg/m2  SpO2 99% , BMI Body mass index is 31.67 kg/(m^2). GEN: Well nourished, well developed, in no acute distress HEENT: normal Neck: no JVD, carotid bruits, or masses Cardiac: RRR; no murmurs, rubs, or gallops,no edema chest scar well-healing Respiratory:  clear to auscultation bilaterally, normal work of  breathing GI: soft, nontender, nondistended, + BS MS: no deformity or atrophy Skin: warm and dry, no rash, 2+ bilateral lower extremity edema, right leg vein harvest site CDI Neuro:  Strength and sensation are intact Psych: euthymic mood, full affect   EKG:  None today   Recent Labs: 12/29/2014: B Natriuretic Peptide 873.8* 12/30/2014: TSH 1.603 02/16/2015: ALT 25 02/19/2015: Magnesium 2.8* 02/21/2015: Hemoglobin 8.9* 02/28/2015: BUN 34*; Creatinine, Ser 1.68*; Platelets 438*; Potassium 5.6*; Sodium 141    Lipid Panel    Component Value Date/Time   CHOL 153 11/30/2014 1022   TRIG 145 11/30/2014 1022  HDL 41 11/30/2014 1022   CHOLHDL 3.7 11/30/2014 1022   LDLCALC 83 11/30/2014 1022      Wt Readings from Last 3 Encounters:  04/05/15 240 lb (108.863 kg)  03/23/15 247 lb (112.038 kg)  03/02/15 247 lb 6.4 oz (112.22 kg)      Other studies Reviewed: Additional studies/ records that were reviewed today include: Hospital records, lab work, catheterization. Review of the above records demonstrates: As above   ASSESSMENT AND PLAN:  Coronary artery disease status post CABG 02/18/15  - Doing well postoperatively, feeling well, ambulating well. No real complaints today.  - [name] visit reviewed, he has released him.   - Aspirin 325 currently.  Chronic systolic heart failure  - Hopefully, ejection fraction will improve post revascularization.  - Currently 30% on nuclear stress test, 40% echocardiogram  - Continue with carvedilol 12.5 twice a day  - No ACE inhibitor because of chronic kidney disease  - Continuing isosorbide, hydralazine  - Furosemide  - repeating echocardiogram 45 days after revascularization.  Hypertensive heart disease with heart failure  - Blood pressure medications reviewed as above.  - Stable.  Hyperlipidemia  -Continue with atorvastatin 40 mg  - I would like to see LDL less than 70.  - we will check lipids at upcoming  Visit if not done by Dr.  Eulas Post  Diabetes with vascular complications  - CAD  - Per primary team  CKD 3  - Avoid Aleve. Discussed.   Current medicines are reviewed at length with the patient today.  The patient does not have concerns regarding medicines.  The following changes have been made:  no change  Labs/ tests ordered today include: none  Orders Placed This Encounter  Procedures  . Echocardiogram     Disposition:   FU with Bradly Sangiovanni in 4 months  Signed, Candee Furbish, MD  04/05/2015 11:06 AM    [facility] [place], [place], [place]  [address] Phone: [phone]; Fax: [phone]

## 2015-04-05 NOTE — Patient Instructions (Signed)
Medication Instructions:  The current medical regimen is effective;  continue present plan and medications.  Testing/Procedures: Your physician has requested that you have an echocardiogram. Echocardiography is a painless test that uses sound waves to create images of your heart. It provides your doctor with information about the size and shape of your heart and how well your heart's chambers and valves are working. This procedure takes approximately one hour. There are no restrictions for this procedure.  Follow-Up: Follow up in 4 months with Dr Marlou Porch.  If you need a refill on your cardiac medications before your next appointment, please call your pharmacy.  Thank you for choosing [facility]!!

## 2015-04-07 ENCOUNTER — Ambulatory Visit (HOSPITAL_COMMUNITY)
Admission: RE | Admit: 2015-04-07 | Discharge: 2015-04-07 | Disposition: A | Discharge status: Home / Self Care | Payer: Medicare Other | Source: Ambulatory Visit | Attending: Cardiology | Admitting: Cardiology

## 2015-04-07 DIAGNOSIS — I10 Essential (primary) hypertension: Secondary | ICD-10-CM | POA: Diagnosis not present

## 2015-04-07 DIAGNOSIS — Z6831 Body mass index (BMI) 31.0-31.9, adult: Secondary | ICD-10-CM | POA: Insufficient documentation

## 2015-04-07 DIAGNOSIS — E785 Hyperlipidemia, unspecified: Secondary | ICD-10-CM | POA: Insufficient documentation

## 2015-04-07 DIAGNOSIS — I2510 Atherosclerotic heart disease of native coronary artery without angina pectoris: Secondary | ICD-10-CM | POA: Insufficient documentation

## 2015-04-07 DIAGNOSIS — I081 Rheumatic disorders of both mitral and tricuspid valves: Secondary | ICD-10-CM | POA: Insufficient documentation

## 2015-04-07 DIAGNOSIS — E119 Type 2 diabetes mellitus without complications: Secondary | ICD-10-CM | POA: Diagnosis not present

## 2015-04-07 DIAGNOSIS — I255 Ischemic cardiomyopathy: Secondary | ICD-10-CM | POA: Diagnosis not present

## 2015-04-07 DIAGNOSIS — Z951 Presence of aortocoronary bypass graft: Secondary | ICD-10-CM | POA: Diagnosis not present

## 2015-04-12 ENCOUNTER — Other Ambulatory Visit: Payer: Self-pay | Admitting: Internal Medicine

## 2015-04-14 ENCOUNTER — Telehealth: Payer: Self-pay

## 2015-04-14 NOTE — Telephone Encounter (Signed)
Received a fax from [facility], [address] [place] [address] that stated a diagnosis code was needed on the prescription in order to fill to patient's insurance.   Diagnosis code was faxed to pharmacy at [phone].

## 2015-04-26 ENCOUNTER — Encounter (HOSPITAL_COMMUNITY)
Admission: RE | Admit: 2015-04-26 | Discharge: 2015-04-26 | Disposition: A | Discharge status: Home / Self Care | Payer: Medicare Other | Source: Ambulatory Visit | Attending: Cardiology | Admitting: Cardiology

## 2015-04-26 VITALS — BP 158/92 | HR 95 | Ht 73.0 in | Wt 231.7 lb

## 2015-04-26 DIAGNOSIS — Z951 Presence of aortocoronary bypass graft: Secondary | ICD-10-CM | POA: Insufficient documentation

## 2015-04-26 DIAGNOSIS — I2510 Atherosclerotic heart disease of native coronary artery without angina pectoris: Secondary | ICD-10-CM | POA: Insufficient documentation

## 2015-04-26 NOTE — Progress Notes (Signed)
Cardiac Individual Treatment Plan  Patient Details  Name: Henry Carroll MRN: [id number] Date of Birth: December 19, 1950 Referring Provider:  Jerline Pain, MD  Initial Encounter Date:       CARDIAC REHAB PHASE II ORIENTATION from 04/26/2015 in [facility]   Date  04/26/15      Visit Diagnosis: S/P CABG x 4  Patient's Home Medications on Admission:  Current outpatient prescriptions:  .  acetaminophen (TYLENOL) 500 MG tablet, Take 1,000 mg by mouth 2 (two) times daily as needed for moderate pain., Disp: , Rfl:  .  albuterol (PROVENTIL) (2.5 MG/3ML) 0.083% nebulizer solution, USE ONE VIAL (3 ML) IN NEBULIZER EVERY 6 HOURS AS NEEDED FOR WHEEZING OR SHORTNESS OF BREATH, Disp: 300 mL, Rfl: 11 .  amLODipine (NORVASC) 10 MG tablet, Take 1 tablet (10 mg total) by mouth daily., Disp: 30 tablet, Rfl: 6 .  aspirin EC 325 MG EC tablet, Take 1 tablet (325 mg total) by mouth daily., Disp: , Rfl:  .  atorvastatin (LIPITOR) 40 MG tablet, Take 1 tablet (40 mg total) by mouth every evening., Disp: 30 tablet, Rfl: 6 .  benazepril (LOTENSIN) 20 MG tablet, Take 10 mg by mouth daily. , Disp: , Rfl:  .  carvedilol (COREG) 12.5 MG tablet, Take 1 tablet (12.5 mg total) by mouth 2 (two) times daily with a meal., Disp: 60 tablet, Rfl: 6 .  Cholecalciferol ([employer] VITAMIN D3) 1000 UNITS capsule, Take 2 capsules (2,000 Units total) by mouth daily., Disp: 60 capsule, Rfl: 3 .  CINNAMON PO, Take 1 tablet by mouth daily., Disp: , Rfl:  .  Coenzyme Q10 (CO Q 10 PO), Take 1 tablet by mouth daily., Disp: , Rfl:  .  Fluticasone-Salmeterol (ADVAIR DISKUS) 100-50 MCG/DOSE AEPB, Inhale 1 puff into the lungs 2 (two) times daily., Disp: , Rfl:  .  furosemide (LASIX) 40 MG tablet, Take 1 tablet (40 mg total) by mouth daily. (Patient taking differently: Take 40 mg by mouth 2 (two) times daily. ), Disp: 30 tablet, Rfl: 0 .  hydrALAZINE (APRESOLINE) 25 MG tablet, Take 1 tablet (25 mg total) by mouth 3 (three)  times daily., Disp: 180 tablet, Rfl: 5 .  insulin detemir (LEVEMIR) 100 UNIT/ML injection, Inject 0.5 mLs (50 Units total) into the skin 2 (two) times daily., Disp: 30 mL, Rfl: 11 .  linagliptin (TRADJENTA) 5 MG TABS tablet, Take 5 mg by mouth daily., Disp: , Rfl:  .  montelukast (SINGULAIR) 10 MG tablet, Take 10 mg by mouth at bedtime., Disp: , Rfl:  .  PROAIR HFA 108 (90 BASE) MCG/ACT inhaler, INHALE TWO PUFFS BY MOUTH EVERY 6 HOURS AS NEEDED FOR SHORTNESS OF BREATH, Disp: 27 each, Rfl: 3 .  terbinafine (LAMISIL) 1 % cream, Apply 1 application topically daily as needed (foot). , Disp: , Rfl:   Past Medical History: Past Medical History  Diagnosis Date  . Diabetes mellitus   . Asthma   . Hypertension   . Anemia   . Anxiety   . Hyperlipidemia   . CKD (chronic kidney disease), stage III   . Chronic combined systolic (congestive) and diastolic (congestive) heart failure ([place])     a. 12/31/14: 2D ECHO: EF 40-45%, HK of inf myocardium, G1DD, mod MR  . Obesity     a. BMI 33  . Coronary artery disease     a. LHC 01/2015 - triple vessel CAD (mod oLM, mLAD, severe mRCA, intermediate branch stenosis, CTO of mCx). Plan CABG 02/2015.  Marland Kitchen  NSVT (nonsustained ventricular tachycardia) ([facility])     a. 9 beats during 01/2015 adm. BB titrated.  . Mitral regurgitation     a. Mild-mod by echo 12/2014.  Marland Kitchen Myocardial infarction [facility])     pt. states per [name] he has in the past.  . 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 Former Smoker  . Types: Cigars  . Quit date: 12/31/2014  Smokeless tobacco  . Never Used    Labs: Recent Review Flowsheet Data    Labs for ITP Cardiac and Pulmonary Rehab Latest Ref Rng 02/18/2015 02/18/2015 02/18/2015 02/18/2015 02/19/2015   PHART 7.350 - 7.450 7.373 7.320(L) 7.315(L) - -   PCO2ART 35.0 - 45.0 mmHg 41.2 43.1 44.4 - -   HCO3 20.0 - 24.0 mEq/L 23.9 22.2 22.7 - -   TCO2 0 - 100 mmol/L 25 23 24 23 21    ACIDBASEDEF 0.0 - 2.0 mmol/L 1.0  4.0(H) 3.0(H) - -   O2SAT - 99.0 97.0 96.0 - -      Capillary Blood Glucose: Lab Results  Component Value Date   GLUCAP 202* 02/22/2015   GLUCAP 53* 02/22/2015   GLUCAP 40* 02/22/2015   GLUCAP 181* 02/21/2015   GLUCAP 139* 02/21/2015     Exercise Target Goals: Date: 04/26/15  Exercise Program Goal: Individual exercise prescription set with THRR, safety & activity barriers. Participant demonstrates ability to understand and report RPE using BORG scale, to self-measure pulse accurately, and to acknowledge the importance of the exercise prescription.  Exercise Prescription Goal: Starting with aerobic activity 30 plus minutes a day, 3 days per week for initial exercise prescription. Provide home exercise prescription and guidelines that participant acknowledges understanding prior to discharge.  Activity Barriers & Risk Stratification:     Activity Barriers & Cardiac Risk Stratification - 04/26/15 1701    Activity Barriers & Cardiac Risk Stratification   Activity Barriers None   Cardiac Risk Stratification High      6 Minute Walk:     6 Minute Walk      04/26/15 1549       6 Minute Walk   Phase Initial     Distance 1400 feet     Walk Time 6 minutes     # of Rest Breaks 0     MPH 2.65     METS 3.03     RPE 9     Perceived Dyspnea  9     VO2 Peak 12.21     Symptoms No     Resting HR 95 bpm     Resting BP 158/92 mmHg     Max Ex. HR 114 bpm     Max Ex. BP 158/78 mmHg     Pre/Post BP   Baseline BP 158/92 mmHg     6 Minute BP 158/78 mmHg     2 Minute Post BP 128/80 mmHg     Pre/Post BP? Yes        Initial Exercise Prescription:     Initial Exercise Prescription - 04/26/15 1600    Date of Initial Exercise Prescription   Date 04/26/15   Treadmill   MPH 2   Grade 0   Minutes 15   METs 2.53   NuStep   Level 2   Minutes 15   METs 1.5   Prescription Details   Frequency (times per week) 3   Intensity   THRR REST +  30  THRR 40-80% of Max Heartrate  118-130   Ratings of Perceived Exertion 11-13   Progression   Progression Continue to progress workloads to maintain intensity without signs/symptoms of physical distress.   Resistance Training   Training Prescription Yes   Weight 1.0   Reps 10-12      Perform Capillary Blood Glucose checks as needed.  Exercise Prescription Changes:   Discharge Exercise Prescription (Final Exercise Prescription Changes):   Nutrition:  Target Goals: Understanding of nutrition guidelines, daily intake of sodium 1500mg , cholesterol 200mg , calories 30% from fat and 7% or less from saturated fats, daily to have 5 or more servings of fruits and vegetables.  Biometrics:     Pre Biometrics - 04/26/15 1616    Pre Biometrics   Height 6\' 1"  (1.854 m)   Weight 231 lb 11.2 oz (105.098 kg)   Waist Circumference 42.5 inches   Hip Circumference 41 inches   Waist to Hip Ratio 1.04 %   BMI (Calculated) 30.6   Triceps Skinfold 5 mm   % Body Fat 26.1 %   Grip Strength 88 kg   Flexibility 0 in   Single Leg Stand 40 seconds       Nutrition Therapy Plan and Nutrition Goals:     Nutrition Therapy & Goals - 04/26/15 1645    Nutrition Therapy   Diet Heart healthy, diabetic healthy, low sodium   Intervention Plan   Intervention Prescribe, educate and counsel regarding individualized specific dietary modifications aiming towards targeted core components such as weight, hypertension, lipid management, diabetes, heart failure and other comorbidities.   Expected Outcomes Long Term Goal: Adherence to prescribed nutrition plan.      Nutrition Discharge: Rate Your Plate Scores:   Nutrition Goals Re-Evaluation:   Psychosocial: Target Goals: Acknowledge presence or absence of depression, maximize coping skills, provide positive support system. Participant is able to verbalize types and ability to use techniques and skills needed for reducing stress and depression.  Initial Review & Psychosocial  Screening:     Initial Psych Review & Screening - 04/26/15 1655    Initial Review   Current issues with --  None   Family Dynamics   Good Support System? Yes   Concerns --  None   Barriers   Psychosocial barriers to participate in program There are no identifiable barriers or psychosocial needs.   Screening Interventions   Interventions --  Will follow guidelines to regain heart health      Quality of Life Scores:     Quality of Life - 04/26/15 1617    Quality of Life Scores   Health/Function Pre 25.29 %   Socioeconomic Pre 28.21 %   Psych/Spiritual Pre 26.75 %   Family Pre 28.5 %   GLOBAL Pre 26.65 %      PHQ-9:     Recent Review Flowsheet Data    Depression screen [facility] 2/9 04/26/2015 12/29/2014 12/01/2014 12/22/2013 11/10/2013   Decreased Interest 0 0 0 0 0   Down, Depressed, Hopeless 0 0 0 0 0   PHQ - 2 Score 0 0 0 0 0   Altered sleeping 0 - - - -   Tired, decreased energy 0 - - - -   Change in appetite 0 - - - -   Feeling bad or failure about yourself  0 - - - -   Trouble concentrating 0 - - - -   Moving slowly or fidgety/restless 0 - - - -   Suicidal thoughts 0 - - - -  PHQ-9 Score 0 - - - -   Difficult doing work/chores Not difficult at all - - - -      Psychosocial Evaluation and Intervention:   Psychosocial Re-Evaluation:   Vocational Rehabilitation: Provide vocational rehab assistance to qualifying candidates.   Vocational Rehab Evaluation & Intervention:     Vocational Rehab - 04/26/15 1703    Initial Vocational Rehab Evaluation & Intervention   Assessment shows need for Vocational Rehabilitation No      Education: Education Goals: Education classes will be provided on a weekly basis, covering required topics. Participant will state understanding/return demonstration of topics presented.  Learning Barriers/Preferences:     Learning Barriers/Preferences - 04/26/15 1701    Learning Barriers/Preferences   Learning Barriers None    Learning Preferences Written Material      Education Topics: Hypertension, Hypertension Reduction -Define heart disease and high blood pressure. Discus how high blood pressure affects the body and ways to reduce high blood pressure.   Exercise and Your Heart -Discuss why it is important to exercise, the FITT principles of exercise, normal and abnormal responses to exercise, and how to exercise safely.   Angina -Discuss definition of angina, causes of angina, treatment of angina, and how to decrease risk of having angina.   Cardiac Medications -Review what the following cardiac medications are used for, how they affect the body, and side effects that may occur when taking the medications.  Medications include Aspirin, Beta blockers, calcium channel blockers, ACE Inhibitors, angiotensin receptor blockers, diuretics, digoxin, and antihyperlipidemics.   Congestive Heart Failure -Discuss the definition of CHF, how to live with CHF, the signs and symptoms of CHF, and how keep track of weight and sodium intake.   Heart Disease and Intimacy -Discus the effect sexual activity has on the heart, how changes occur during intimacy as we age, and safety during sexual activity.   Smoking Cessation / COPD -Discuss different methods to quit smoking, the health benefits of quitting smoking, and the definition of COPD.   Nutrition I: Fats -Discuss the types of cholesterol, what cholesterol does to the heart, and how cholesterol levels can be controlled.   Nutrition II: Labels -Discuss the different components of food labels and how to read food label   Heart Parts and Heart Disease -Discuss the anatomy of the heart, the pathway of blood circulation through the heart, and these are affected by heart disease.   Stress I: Signs and Symptoms -Discuss the causes of stress, how stress may lead to anxiety and depression, and ways to limit stress.   Stress II: Relaxation -Discuss different  types of relaxation techniques to limit stress.   Warning Signs of Stroke / TIA -Discuss definition of a stroke, what the signs and symptoms are of a stroke, and how to identify when someone is having stroke.   Knowledge Questionnaire Score:     Knowledge Questionnaire Score - 04/26/15 1702    Knowledge Questionnaire Score   Pre Score 21/24      Personal Goals and Risk Factors at Admission:     Personal Goals and Risk Factors at Admission - 04/26/15 1646    Core Components/Risk Factors/Patient Goals on Admission    Weight Management Yes   Intervention Weight Management/Obesity: Establish reasonable short term and long term weight goals.;Weight Management: Provide education and appropriate resources to help participant work on and attain dietary goals.   Admit Weight 231 lb 11.2 oz (105.098 kg)   Goal Weight: Short Term 200 lb (90.719 kg)  Goal Weight: Long Term 190 lb (86.183 kg)   Sedentary --  Walking daily   Tobacco Cessation --  None   Improve shortness of breath with ADL's --  N/A   Develop more efficient breathing techniques such as purse lipped breathing and diaphragmatic breathing; and practicing self-pacing with activity --  Not at this time   Increase knowledge of respiratory medications and ability to use respiratory devices properly  --  N/A   Diabetes --  Patient is already managing his diabetes.    Hypertension Yes   Intervention Monitor prescription use compliance.   Expected Outcomes Long Term: Maintenance of blood pressure at goal levels.   Lipids Yes   Intervention Provide education and support for participant on nutrition & aerobic/resistive exercise along with prescribed medications to achieve LDL 70mg , HDL >40mg .   Expected Outcomes Short Term: Participant states understanding of desired cholesterol values and is compliant with medications prescribed. Participant is following exercise prescription and nutrition guidelines.;Long Term: Cholesterol  controlled with medications as prescribed, with individualized exercise RX and with personalized nutrition plan. Value goals: LDL < 70mg , HDL > 40 mg.   Stress --  N/A   Personal Goal Other Yes   Personal Goal Increase Stamina, Get back to normal, play with grandchildren and play golf   Intervention Work towards this personal goals incrementally throughout 12 weeks   Expected Outcomes Get back to golf course by June of program in May, lose 10-15lbs by May      Personal Goals and Risk Factors Review:    Personal Goals Discharge (Final Personal Goals and Risk Factors Review):    ITP Comments:   Comments: Patient arrived for 1st visit/orientation/education at 2:45pm. Patient was referred to CR by [name] due to CABGx4 Z95.1. During orientation advised patient on arrival and appointment times what to wear, what to do before, during and after exercise. Reviewed attendance and class policy. Talked about inclement weather and class consultation policy. Pt is scheduled to return Cardiac Rehab on 05/02/15 at 3:45pm. Pt was advised to come to class 15 minutes before class starts. He was also given instructions on meeting with the dietician and attending the Family Structure classes. Pt is eager to get started. Patient was able to complete 6 minute walk test. Patient was measured for the equipment. Discussed equipment safety with patient. Took patient pre-anthropometric measurements. Patient finished visit at 4:48pm.

## 2015-04-26 NOTE — Progress Notes (Signed)
Cardiac Individual Treatment Plan  Patient Details  Name: Henry Carroll MRN: [id number] Date of Birth: [birth year] Referring Provider:  Jerline Pain, MD  Initial Encounter Date:       CARDIAC REHAB PHASE II ORIENTATION from 04/26/2015 in [place]   Date  04/26/15      Visit Diagnosis: S/P CABG x 4  Patient's Home Medications on Admission:  Current outpatient prescriptions:  .  acetaminophen (TYLENOL) 500 MG tablet, Take 1,000 mg by mouth 2 (two) times daily as needed for moderate pain., Disp: , Rfl:  .  albuterol (PROVENTIL) (2.5 MG/3ML) 0.083% nebulizer solution, USE ONE VIAL (3 ML) IN NEBULIZER EVERY 6 HOURS AS NEEDED FOR WHEEZING OR SHORTNESS OF BREATH, Disp: 300 mL, Rfl: 11 .  amLODipine (NORVASC) 10 MG tablet, Take 1 tablet (10 mg total) by mouth daily., Disp: 30 tablet, Rfl: 6 .  aspirin EC 325 MG EC tablet, Take 1 tablet (325 mg total) by mouth daily., Disp: , Rfl:  .  atorvastatin (LIPITOR) 40 MG tablet, Take 1 tablet (40 mg total) by mouth every evening., Disp: 30 tablet, Rfl: 6 .  benazepril (LOTENSIN) 20 MG tablet, Take 10 mg by mouth daily. , Disp: , Rfl:  .  carvedilol (COREG) 12.5 MG tablet, Take 1 tablet (12.5 mg total) by mouth 2 (two) times daily with a meal., Disp: 60 tablet, Rfl: 6 .  Cholecalciferol ([employer] VITAMIN D3) 1000 UNITS capsule, Take 2 capsules (2,000 Units total) by mouth daily., Disp: 60 capsule, Rfl: 3 .  CINNAMON PO, Take 1 tablet by mouth daily., Disp: , Rfl:  .  Coenzyme Q10 (CO Q 10 PO), Take 1 tablet by mouth daily., Disp: , Rfl:  .  Fluticasone-Salmeterol (ADVAIR DISKUS) 100-50 MCG/DOSE AEPB, Inhale 1 puff into the lungs 2 (two) times daily., Disp: , Rfl:  .  furosemide (LASIX) 40 MG tablet, Take 1 tablet (40 mg total) by mouth daily. (Patient taking differently: Take 40 mg by mouth 2 (two) times daily. ), Disp: 30 tablet, Rfl: 0 .  hydrALAZINE (APRESOLINE) 25 MG tablet, Take 1 tablet (25 mg total) by mouth 3 (three)  times daily., Disp: 180 tablet, Rfl: 5 .  insulin detemir (LEVEMIR) 100 UNIT/ML injection, Inject 0.5 mLs (50 Units total) into the skin 2 (two) times daily., Disp: 30 mL, Rfl: 11 .  linagliptin (TRADJENTA) 5 MG TABS tablet, Take 5 mg by mouth daily., Disp: , Rfl:  .  montelukast (SINGULAIR) 10 MG tablet, Take 10 mg by mouth at bedtime., Disp: , Rfl:  .  PROAIR HFA 108 (90 BASE) MCG/ACT inhaler, INHALE TWO PUFFS BY MOUTH EVERY 6 HOURS AS NEEDED FOR SHORTNESS OF BREATH, Disp: 27 each, Rfl: 3 .  terbinafine (LAMISIL) 1 % cream, Apply 1 application topically daily as needed (foot). , Disp: , Rfl:   Past Medical History: Past Medical History  Diagnosis Date  . Diabetes mellitus   . Asthma   . Hypertension   . Anemia   . Anxiety   . Hyperlipidemia   . CKD (chronic kidney disease), stage III   . Chronic combined systolic (congestive) and diastolic (congestive) heart failure ([place])     a. 12/31/14: 2D ECHO: EF 40-45%, HK of inf myocardium, G1DD, mod MR  . Obesity     a. BMI 33  . Coronary artery disease     a. LHC 01/2015 - triple vessel CAD (mod oLM, mLAD, severe mRCA, intermediate branch stenosis, CTO of mCx). Plan CABG 02/2015.  Marland Kitchen  NSVT (nonsustained ventricular tachycardia) ([facility])     a. 9 beats during 01/2015 adm. BB titrated.  . Mitral regurgitation     a. Mild-mod by echo 12/2014.  Marland Kitchen Myocardial infarction [facility])     pt. states per [name] he has in the past.  . 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 Former Smoker  . Types: Cigars  . Quit date: 12/31/2014  Smokeless tobacco  . Never Used    Labs: Recent Review Flowsheet Data    Labs for ITP Cardiac and Pulmonary Rehab Latest Ref Rng 02/18/2015 02/18/2015 02/18/2015 02/18/2015 02/19/2015   PHART 7.350 - 7.450 7.373 7.320(L) 7.315(L) - -   PCO2ART 35.0 - 45.0 mmHg 41.2 43.1 44.4 - -   HCO3 20.0 - 24.0 mEq/L 23.9 22.2 22.7 - -   TCO2 0 - 100 mmol/L 25 23 24 23 21    ACIDBASEDEF 0.0 - 2.0 mmol/L 1.0  4.0(H) 3.0(H) - -   O2SAT - 99.0 97.0 96.0 - -      Capillary Blood Glucose: Lab Results  Component Value Date   GLUCAP 202* 02/22/2015   GLUCAP 53* 02/22/2015   GLUCAP 40* 02/22/2015   GLUCAP 181* 02/21/2015   GLUCAP 139* 02/21/2015     Exercise Target Goals: Date: 04/26/15  Exercise Program Goal: Individual exercise prescription set with THRR, safety & activity barriers. Participant demonstrates ability to understand and report RPE using BORG scale, to self-measure pulse accurately, and to acknowledge the importance of the exercise prescription.  Exercise Prescription Goal: Starting with aerobic activity 30 plus minutes a day, 3 days per week for initial exercise prescription. Provide home exercise prescription and guidelines that participant acknowledges understanding prior to discharge.  Activity Barriers & Risk Stratification:     Activity Barriers & Cardiac Risk Stratification - 04/26/15 1701    Activity Barriers & Cardiac Risk Stratification   Activity Barriers None   Cardiac Risk Stratification High      6 Minute Walk:     6 Minute Walk      04/26/15 1549       6 Minute Walk   Phase Initial     Distance 1400 feet     Walk Time 6 minutes     # of Rest Breaks 0     MPH 2.65     METS 3.03     RPE 9     Perceived Dyspnea  9     VO2 Peak 12.21     Symptoms No     Resting HR 95 bpm     Resting BP 158/92 mmHg     Max Ex. HR 114 bpm     Max Ex. BP 158/78 mmHg     Pre/Post BP   Baseline BP 158/92 mmHg     6 Minute BP 158/78 mmHg     2 Minute Post BP 128/80 mmHg     Pre/Post BP? Yes        Initial Exercise Prescription:     Initial Exercise Prescription - 04/26/15 1600    Date of Initial Exercise Prescription   Date 04/26/15   Treadmill   MPH 2   Grade 0   Minutes 15   METs 2.53   NuStep   Level 2   Minutes 15   METs 1.5   Prescription Details   Frequency (times per week) 3   Intensity   THRR REST +  30  THRR 40-80% of Max Heartrate  118-130   Ratings of Perceived Exertion 11-13   Progression   Progression Continue to progress workloads to maintain intensity without signs/symptoms of physical distress.   Resistance Training   Training Prescription Yes   Weight 1.0   Reps 10-12      Perform Capillary Blood Glucose checks as needed.  Exercise Prescription Changes:   Discharge Exercise Prescription (Final Exercise Prescription Changes):   Nutrition:  Target Goals: Understanding of nutrition guidelines, daily intake of sodium 1500mg , cholesterol 200mg , calories 30% from fat and 7% or less from saturated fats, daily to have 5 or more servings of fruits and vegetables.  Biometrics:     Pre Biometrics - 04/26/15 1616    Pre Biometrics   Height 6\' 1"  (1.854 m)   Weight 231 lb 11.2 oz (105.098 kg)   Waist Circumference 42.5 inches   Hip Circumference 41 inches   Waist to Hip Ratio 1.04 %   BMI (Calculated) 30.6   Triceps Skinfold 5 mm   % Body Fat 26.1 %   Grip Strength 88 kg   Flexibility 0 in   Single Leg Stand 40 seconds       Nutrition Therapy Plan and Nutrition Goals:     Nutrition Therapy & Goals - 04/26/15 1645    Nutrition Therapy   Diet Heart healthy, diabetic healthy, low sodium   Intervention Plan   Intervention Prescribe, educate and counsel regarding individualized specific dietary modifications aiming towards targeted core components such as weight, hypertension, lipid management, diabetes, heart failure and other comorbidities.   Expected Outcomes Long Term Goal: Adherence to prescribed nutrition plan.      Nutrition Discharge: Rate Your Plate Scores:     Nutrition Assessments - 04/26/15 1708    MEDFICTS Scores   Pre Score 60      Nutrition Goals Re-Evaluation:   Psychosocial: Target Goals: Acknowledge presence or absence of depression, maximize coping skills, provide positive support system. Participant is able to verbalize types and ability to use techniques and skills  needed for reducing stress and depression.  Initial Review & Psychosocial Screening:     Initial Psych Review & Screening - 04/26/15 1655    Initial Review   Current issues with --  None   Family Dynamics   Good Support System? Yes   Concerns --  None   Barriers   Psychosocial barriers to participate in program There are no identifiable barriers or psychosocial needs.   Screening Interventions   Interventions --  Will follow guidelines to regain heart health      Quality of Life Scores:     Quality of Life - 04/26/15 1617    Quality of Life Scores   Health/Function Pre 25.29 %   Socioeconomic Pre 28.21 %   Psych/Spiritual Pre 26.75 %   Family Pre 28.5 %   GLOBAL Pre 26.65 %      PHQ-9:     Recent Review Flowsheet Data    Depression screen [facility] 2/9 04/26/2015 12/29/2014 12/01/2014 12/22/2013 11/10/2013   Decreased Interest 0 0 0 0 0   Down, Depressed, Hopeless 0 0 0 0 0   PHQ - 2 Score 0 0 0 0 0   Altered sleeping 0 - - - -   Tired, decreased energy 0 - - - -   Change in appetite 0 - - - -   Feeling bad or failure about yourself  0 - - - -   Trouble concentrating  0 - - - -   Moving slowly or fidgety/restless 0 - - - -   Suicidal thoughts 0 - - - -   PHQ-9 Score 0 - - - -   Difficult doing work/chores Not difficult at all - - - -      Psychosocial Evaluation and Intervention:   Psychosocial Re-Evaluation:   Vocational Rehabilitation: Provide vocational rehab assistance to qualifying candidates.   Vocational Rehab Evaluation & Intervention:     Vocational Rehab - 04/26/15 1703    Initial Vocational Rehab Evaluation & Intervention   Assessment shows need for Vocational Rehabilitation No      Education: Education Goals: Education classes will be provided on a weekly basis, covering required topics. Participant will state understanding/return demonstration of topics presented.  Learning Barriers/Preferences:     Learning Barriers/Preferences -  04/26/15 1701    Learning Barriers/Preferences   Learning Barriers None   Learning Preferences Written Material      Education Topics: Hypertension, Hypertension Reduction -Define heart disease and high blood pressure. Discus how high blood pressure affects the body and ways to reduce high blood pressure.   Exercise and Your Heart -Discuss why it is important to exercise, the FITT principles of exercise, normal and abnormal responses to exercise, and how to exercise safely.   Angina -Discuss definition of angina, causes of angina, treatment of angina, and how to decrease risk of having angina.   Cardiac Medications -Review what the following cardiac medications are used for, how they affect the body, and side effects that may occur when taking the medications.  Medications include Aspirin, Beta blockers, calcium channel blockers, ACE Inhibitors, angiotensin receptor blockers, diuretics, digoxin, and antihyperlipidemics.   Congestive Heart Failure -Discuss the definition of CHF, how to live with CHF, the signs and symptoms of CHF, and how keep track of weight and sodium intake.   Heart Disease and Intimacy -Discus the effect sexual activity has on the heart, how changes occur during intimacy as we age, and safety during sexual activity.   Smoking Cessation / COPD -Discuss different methods to quit smoking, the health benefits of quitting smoking, and the definition of COPD.   Nutrition I: Fats -Discuss the types of cholesterol, what cholesterol does to the heart, and how cholesterol levels can be controlled.   Nutrition II: Labels -Discuss the different components of food labels and how to read food label   Heart Parts and Heart Disease -Discuss the anatomy of the heart, the pathway of blood circulation through the heart, and these are affected by heart disease.   Stress I: Signs and Symptoms -Discuss the causes of stress, how stress may lead to anxiety and depression,  and ways to limit stress.   Stress II: Relaxation -Discuss different types of relaxation techniques to limit stress.   Warning Signs of Stroke / TIA -Discuss definition of a stroke, what the signs and symptoms are of a stroke, and how to identify when someone is having stroke.   Knowledge Questionnaire Score:     Knowledge Questionnaire Score - 04/26/15 1702    Knowledge Questionnaire Score   Pre Score 21/24      Personal Goals and Risk Factors at Admission:     Personal Goals and Risk Factors at Admission - 04/26/15 1646    Core Components/Risk Factors/Patient Goals on Admission    Weight Management Yes   Intervention Weight Management/Obesity: Establish reasonable short term and long term weight goals.;Weight Management: Provide education and appropriate resources to help  participant work on and attain dietary goals.   Admit Weight 231 lb 11.2 oz (105.098 kg)   Goal Weight: Short Term 200 lb (90.719 kg)   Goal Weight: Long Term 190 lb (86.183 kg)   Sedentary --  Walking daily   Tobacco Cessation --  None   Improve shortness of breath with ADL's --  N/A   Develop more efficient breathing techniques such as purse lipped breathing and diaphragmatic breathing; and practicing self-pacing with activity --  Not at this time   Increase knowledge of respiratory medications and ability to use respiratory devices properly  --  N/A   Diabetes --  Patient is already managing his diabetes.    Hypertension Yes   Intervention Monitor prescription use compliance.   Expected Outcomes Long Term: Maintenance of blood pressure at goal levels.   Lipids Yes   Intervention Provide education and support for participant on nutrition & aerobic/resistive exercise along with prescribed medications to achieve LDL 70mg , HDL >40mg .   Expected Outcomes Short Term: Participant states understanding of desired cholesterol values and is compliant with medications prescribed. Participant is following  exercise prescription and nutrition guidelines.;Long Term: Cholesterol controlled with medications as prescribed, with individualized exercise RX and with personalized nutrition plan. Value goals: LDL < 70mg , HDL > 40 mg.   Stress --  N/A   Personal Goal Other Yes   Personal Goal Increase Stamina, Get back to normal, play with grandchildren and play golf   Intervention Work towards this personal goals incrementally throughout 12 weeks   Expected Outcomes Get back to golf course by June of program in May, lose 10-15lbs by May      Personal Goals and Risk Factors Review:    Personal Goals Discharge (Final Personal Goals and Risk Factors Review):  Pt has finished orientation/orientation and is scheduled to start CR on 05/02/15 at 3:45pm. Pt has been instructed to arrive to class 15 minutes early for scheduled class. Pt has been instructed to wear comfortable clothing and shoes with rubber soles. Pt has been told to take their medications 2 hours prior to coming to class.  Patient has been instructed to let staff know if he has had any changes in his health prior to coming to class. Patient has been instructed not to come if he has a cold, fever/flu or nausea/vomiting. If the patient is not going to attend class, he/she has been instructed to call.    ITP Comments:   Comments: Patient arrived for 1st visit/orientation/education at 2:45pm on 04/26/15. Patient was referred to CR by [facility] due to [place] (Z95.1). During orientation advised patient on arrival and appointment times what to wear, what to do before, during and after exercise. Reviewed attendance and class policy. Talked about inclement weather and class consultation policy. Pt is scheduled to return Cardiac Rehab on 05/02/15 at 3:45pm. Pt was advised to come to class 15 minutes before class starts. He was also given instructions on meeting with the dietician and attending the Family Structure classes. Pt is eager to get started. Patient was  able to complete 6 minute walk test. Patient was measured for the equipment. Discussed equipment safety with patient. Took patient pre-anthropometric measurements. Patient finished visit at 4:48 pm.

## 2015-04-26 NOTE — Progress Notes (Deleted)
Cardiac Individual Treatment Plan  Patient Details  Name: Henry Carroll MRN: [id number] Date of Birth: November 05, 1950 Referring Provider:  Jerline Pain, MD  Initial Encounter Date:       CARDIAC REHAB PHASE II ORIENTATION from 04/26/2015 in [place]   Date  04/26/15      Visit Diagnosis: S/P CABG x 4  Patient's Home Medications on Admission:  Current outpatient prescriptions:  .  acetaminophen (TYLENOL) 500 MG tablet, Take 1,000 mg by mouth 2 (two) times daily as needed for moderate pain., Disp: , Rfl:  .  albuterol (PROVENTIL) (2.5 MG/3ML) 0.083% nebulizer solution, USE ONE VIAL (3 ML) IN NEBULIZER EVERY 6 HOURS AS NEEDED FOR WHEEZING OR SHORTNESS OF BREATH, Disp: 300 mL, Rfl: 11 .  amLODipine (NORVASC) 10 MG tablet, Take 1 tablet (10 mg total) by mouth daily., Disp: 30 tablet, Rfl: 6 .  aspirin EC 325 MG EC tablet, Take 1 tablet (325 mg total) by mouth daily., Disp: , Rfl:  .  atorvastatin (LIPITOR) 40 MG tablet, Take 1 tablet (40 mg total) by mouth every evening., Disp: 30 tablet, Rfl: 6 .  benazepril (LOTENSIN) 20 MG tablet, Take 10 mg by mouth daily. , Disp: , Rfl:  .  carvedilol (COREG) 12.5 MG tablet, Take 1 tablet (12.5 mg total) by mouth 2 (two) times daily with a meal., Disp: 60 tablet, Rfl: 6 .  Cholecalciferol ([employer] VITAMIN D3) 1000 UNITS capsule, Take 2 capsules (2,000 Units total) by mouth daily., Disp: 60 capsule, Rfl: 3 .  CINNAMON PO, Take 1 tablet by mouth daily., Disp: , Rfl:  .  Coenzyme Q10 (CO Q 10 PO), Take 1 tablet by mouth daily., Disp: , Rfl:  .  Fluticasone-Salmeterol (ADVAIR DISKUS) 100-50 MCG/DOSE AEPB, Inhale 1 puff into the lungs 2 (two) times daily., Disp: , Rfl:  .  furosemide (LASIX) 40 MG tablet, Take 1 tablet (40 mg total) by mouth daily. (Patient taking differently: Take 40 mg by mouth 2 (two) times daily. ), Disp: 30 tablet, Rfl: 0 .  hydrALAZINE (APRESOLINE) 25 MG tablet, Take 1 tablet (25 mg total) by mouth 3 (three)  times daily., Disp: 180 tablet, Rfl: 5 .  insulin detemir (LEVEMIR) 100 UNIT/ML injection, Inject 0.5 mLs (50 Units total) into the skin 2 (two) times daily., Disp: 30 mL, Rfl: 11 .  linagliptin (TRADJENTA) 5 MG TABS tablet, Take 5 mg by mouth daily., Disp: , Rfl:  .  montelukast (SINGULAIR) 10 MG tablet, Take 10 mg by mouth at bedtime., Disp: , Rfl:  .  PROAIR HFA 108 (90 BASE) MCG/ACT inhaler, INHALE TWO PUFFS BY MOUTH EVERY 6 HOURS AS NEEDED FOR SHORTNESS OF BREATH, Disp: 27 each, Rfl: 3 .  terbinafine (LAMISIL) 1 % cream, Apply 1 application topically daily as needed (foot). , Disp: , Rfl:   Past Medical History: Past Medical History  Diagnosis Date  . Diabetes mellitus   . Asthma   . Hypertension   . Anemia   . Anxiety   . Hyperlipidemia   . CKD (chronic kidney disease), stage III   . Chronic combined systolic (congestive) and diastolic (congestive) heart failure ([place])     a. 12/31/14: 2D ECHO: EF 40-45%, HK of inf myocardium, G1DD, mod MR  . Obesity     a. BMI 33  . Coronary artery disease     a. LHC 01/2015 - triple vessel CAD (mod oLM, mLAD, severe mRCA, intermediate branch stenosis, CTO of mCx). Plan CABG 02/2015.  Marland Kitchen  NSVT (nonsustained ventricular tachycardia) ([facility])     a. 9 beats during 01/2015 adm. BB titrated.  . Mitral regurgitation     a. Mild-mod by echo 12/2014.  Marland Kitchen Myocardial infarction [facility])     pt. states per [name] he has in the past.  . 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 Former Smoker  . Types: Cigars  . Quit date: 12/31/2014  Smokeless tobacco  . Never Used    Labs:     Recent Review Flowsheet Data    Labs for ITP Cardiac and Pulmonary Rehab Latest Ref Rng 02/18/2015 02/18/2015 02/18/2015 02/18/2015 02/19/2015   PHART 7.350 - 7.450 7.373 7.320(L) 7.315(L) - -   PCO2ART 35.0 - 45.0 mmHg 41.2 43.1 44.4 - -   HCO3 20.0 - 24.0 mEq/L 23.9 22.2 22.7 - -   TCO2 0 - 100 mmol/L 25 23 24 23 21    ACIDBASEDEF 0.0 - 2.0  mmol/L 1.0 4.0(H) 3.0(H) - -   O2SAT - 99.0 97.0 96.0 - -      Capillary Blood Glucose: Lab Results  Component Value Date   GLUCAP 202* 02/22/2015   GLUCAP 53* 02/22/2015   GLUCAP 40* 02/22/2015   GLUCAP 181* 02/21/2015   GLUCAP 139* 02/21/2015     Exercise Target Goals: Date: 04/26/15  Exercise Program Goal: Individual exercise prescription set with THRR, safety & activity barriers. Participant demonstrates ability to understand and report RPE using BORG scale, to self-measure pulse accurately, and to acknowledge the importance of the exercise prescription.  Exercise Prescription Goal: Starting with aerobic activity 30 plus minutes a day, 3 days per week for initial exercise prescription. Provide home exercise prescription and guidelines that participant acknowledges understanding prior to discharge.  Activity Barriers & Risk Stratification:     Activity Barriers & Cardiac Risk Stratification - 04/26/15 1701    Activity Barriers & Cardiac Risk Stratification   Activity Barriers None   Cardiac Risk Stratification High      6 Minute Walk:     6 Minute Walk      04/26/15 1549       6 Minute Walk   Phase Initial     Distance 1400 feet     Walk Time 6 minutes     # of Rest Breaks 0     MPH 2.65     METS 3.03     RPE 9     Perceived Dyspnea  9     VO2 Peak 12.21     Symptoms No     Resting HR 95 bpm     Resting BP 158/92 mmHg     Max Ex. HR 114 bpm     Max Ex. BP 158/78 mmHg     Pre/Post BP   Baseline BP 158/92 mmHg     6 Minute BP 158/78 mmHg     2 Minute Post BP 128/80 mmHg     Pre/Post BP? Yes        Initial Exercise Prescription:     Initial Exercise Prescription - 04/26/15 1600    Date of Initial Exercise Prescription   Date 04/26/15   Treadmill   MPH 2   Grade 0   Minutes 15   METs 2.53   NuStep   Level 2   Minutes 15   METs 1.5   Prescription Details   Frequency (times per week) 3   Intensity   THRR  REST +  30   THRR 40-80% of Max  Heartrate 118-130   Ratings of Perceived Exertion 11-13   Progression   Progression Continue to progress workloads to maintain intensity without signs/symptoms of physical distress.   Resistance Training   Training Prescription Yes   Weight 1.0   Reps 10-12      Perform Capillary Blood Glucose checks as needed.  Exercise Prescription Changes:   Discharge Exercise Prescription (Final Exercise Prescription Changes):   Nutrition:  Target Goals: Understanding of nutrition guidelines, daily intake of sodium 1500mg , cholesterol 200mg , calories 30% from fat and 7% or less from saturated fats, daily to have 5 or more servings of fruits and vegetables.  Biometrics:     Pre Biometrics - 04/26/15 1616    Pre Biometrics   Height 6\' 1"  (1.854 m)   Weight 231 lb 11.2 oz (105.098 kg)   Waist Circumference 42.5 inches   Hip Circumference 41 inches   Waist to Hip Ratio 1.04 %   BMI (Calculated) 30.6   Triceps Skinfold 5 mm   % Body Fat 26.1 %   Grip Strength 88 kg   Flexibility 0 in   Single Leg Stand 40 seconds       Nutrition Therapy Plan and Nutrition Goals:     Nutrition Therapy & Goals - 04/26/15 1645    Nutrition Therapy   Diet Heart healthy, diabetic healthy, low sodium   Intervention Plan   Intervention Prescribe, educate and counsel regarding individualized specific dietary modifications aiming towards targeted core components such as weight, hypertension, lipid management, diabetes, heart failure and other comorbidities.   Expected Outcomes Long Term Goal: Adherence to prescribed nutrition plan.      Nutrition Discharge: Rate Your Plate Scores:     Nutrition Assessments - 04/26/15 1708    MEDFICTS Scores   Pre Score 60      Nutrition Goals Re-Evaluation:   Psychosocial: Target Goals: Acknowledge presence or absence of depression, maximize coping skills, provide positive support system. Participant is able to verbalize types and ability to use techniques  and skills needed for reducing stress and depression.  Initial Review & Psychosocial Screening:     Initial Psych Review & Screening - 04/26/15 1655    Initial Review   Current issues with --  None   Family Dynamics   Good Support System? Yes   Concerns --  None   Barriers   Psychosocial barriers to participate in program There are no identifiable barriers or psychosocial needs.   Screening Interventions   Interventions --  Will follow guidelines to regain heart health      Quality of Life Scores:     Quality of Life - 04/26/15 1617    Quality of Life Scores   Health/Function Pre 25.29 %   Socioeconomic Pre 28.21 %   Psych/Spiritual Pre 26.75 %   Family Pre 28.5 %   GLOBAL Pre 26.65 %      PHQ-9:     Recent Review Flowsheet Data    Depression screen [facility] 2/9 04/26/2015 12/29/2014 12/01/2014 12/22/2013 11/10/2013   Decreased Interest 0 0 0 0 0   Down, Depressed, Hopeless 0 0 0 0 0   PHQ - 2 Score 0 0 0 0 0   Altered sleeping 0 - - - -   Tired, decreased energy 0 - - - -   Change in appetite 0 - - - -   Feeling bad or failure about yourself  0 - - - -  Trouble concentrating 0 - - - -   Moving slowly or fidgety/restless 0 - - - -   Suicidal thoughts 0 - - - -   PHQ-9 Score 0 - - - -   Difficult doing work/chores Not difficult at all - - - -      Psychosocial Evaluation and Intervention:   Psychosocial Re-Evaluation:   Vocational Rehabilitation: Provide vocational rehab assistance to qualifying candidates.   Vocational Rehab Evaluation & Intervention:     Vocational Rehab - 04/26/15 1703    Initial Vocational Rehab Evaluation & Intervention   Assessment shows need for Vocational Rehabilitation No      Education: Education Goals: Education classes will be provided on a weekly basis, covering required topics. Participant will state understanding/return demonstration of topics presented.  Learning Barriers/Preferences:     Learning  Barriers/Preferences - 04/26/15 1701    Learning Barriers/Preferences   Learning Barriers None   Learning Preferences Written Material      Education Topics: Hypertension, Hypertension Reduction -Define heart disease and high blood pressure. Discus how high blood pressure affects the body and ways to reduce high blood pressure.   Exercise and Your Heart -Discuss why it is important to exercise, the FITT principles of exercise, normal and abnormal responses to exercise, and how to exercise safely.   Angina -Discuss definition of angina, causes of angina, treatment of angina, and how to decrease risk of having angina.   Cardiac Medications -Review what the following cardiac medications are used for, how they affect the body, and side effects that may occur when taking the medications.  Medications include Aspirin, Beta blockers, calcium channel blockers, ACE Inhibitors, angiotensin receptor blockers, diuretics, digoxin, and antihyperlipidemics.   Congestive Heart Failure -Discuss the definition of CHF, how to live with CHF, the signs and symptoms of CHF, and how keep track of weight and sodium intake.   Heart Disease and Intimacy -Discus the effect sexual activity has on the heart, how changes occur during intimacy as we age, and safety during sexual activity.   Smoking Cessation / COPD -Discuss different methods to quit smoking, the health benefits of quitting smoking, and the definition of COPD.   Nutrition I: Fats -Discuss the types of cholesterol, what cholesterol does to the heart, and how cholesterol levels can be controlled.   Nutrition II: Labels -Discuss the different components of food labels and how to read food label   Heart Parts and Heart Disease -Discuss the anatomy of the heart, the pathway of blood circulation through the heart, and these are affected by heart disease.   Stress I: Signs and Symptoms -Discuss the causes of stress, how stress may lead to  anxiety and depression, and ways to limit stress.   Stress II: Relaxation -Discuss different types of relaxation techniques to limit stress.   Warning Signs of Stroke / TIA -Discuss definition of a stroke, what the signs and symptoms are of a stroke, and how to identify when someone is having stroke.   Knowledge Questionnaire Score:     Knowledge Questionnaire Score - 04/26/15 1702    Knowledge Questionnaire Score   Pre Score 21/24      Personal Goals and Risk Factors at Admission:     Personal Goals and Risk Factors at Admission - 04/26/15 1646    Core Components/Risk Factors/Patient Goals on Admission    Weight Management Yes   Intervention Weight Management/Obesity: Establish reasonable short term and long term weight goals.;Weight Management: Provide education and appropriate resources  to help participant work on and attain dietary goals.   Admit Weight 231 lb 11.2 oz (105.098 kg)   Goal Weight: Short Term 200 lb (90.719 kg)   Goal Weight: Long Term 190 lb (86.183 kg)   Sedentary --  Walking daily   Tobacco Cessation --  None   Improve shortness of breath with ADL's --  N/A   Develop more efficient breathing techniques such as purse lipped breathing and diaphragmatic breathing; and practicing self-pacing with activity --  Not at this time   Increase knowledge of respiratory medications and ability to use respiratory devices properly  --  N/A   Diabetes --  Patient is already managing his diabetes.    Hypertension Yes   Intervention Monitor prescription use compliance.   Expected Outcomes Long Term: Maintenance of blood pressure at goal levels.   Lipids Yes   Intervention Provide education and support for participant on nutrition & aerobic/resistive exercise along with prescribed medications to achieve LDL 70mg , HDL >40mg .   Expected Outcomes Short Term: Participant states understanding of desired cholesterol values and is compliant with medications prescribed.  Participant is following exercise prescription and nutrition guidelines.;Long Term: Cholesterol controlled with medications as prescribed, with individualized exercise RX and with personalized nutrition plan. Value goals: LDL < 70mg , HDL > 40 mg.   Stress --  N/A   Personal Goal Other Yes   Personal Goal Increase Stamina, Get back to normal, play with grandchildren and play golf   Intervention Work towards this personal goals incrementally throughout 12 weeks   Expected Outcomes Get back to golf course by June of program in May, lose 10-15lbs by May      Personal Goals and Risk Factors Review:  Patient Instructions  Patient Details  Name: Henry Carroll MRN: [id number] Date of Birth: 10/13/1950 Referring Provider:  Jerline Pain, MD  Below are the personal goals you chose as well as exercise and nutrition goals. Our goal is to help you keep on track towards obtaining and maintaining your goals. We will be discussing your progress on these goals with you throughout the program.  Initial Exercise Prescription:     Initial Exercise Prescription - 04/26/15 1600    Date of Initial Exercise Prescription   Date 04/26/15   Treadmill   MPH 2   Grade 0   Minutes 15   METs 2.53   NuStep   Level 2   Minutes 15   METs 1.5   Prescription Details   Frequency (times per week) 3   Intensity   THRR REST +  30   THRR 40-80% of Max Heartrate 118-130   Ratings of Perceived Exertion 11-13   Progression   Progression Continue to progress workloads to maintain intensity without signs/symptoms of physical distress.   Resistance Training   Training Prescription Yes   Weight 1.0   Reps 10-12      Exercise Goals: Frequency: Be able to perform aerobic exercise three times per week working toward 3-5 days per week.  Intensity: Work with a perceived exertion of 11 (fairly light) - 15 (hard) as tolerated. Follow your new exercise prescription and watch for changes in prescription as you  progress with the program. Changes will be reviewed with you when they are made.  Duration: You should be able to do 30 minutes of continuous aerobic exercise in addition to a 5 minute warm-up and a 5 minute cool-down routine.  Nutrition Goals: Your personal nutrition goals will be established when  you do your nutrition analysis with the dietician.  The following are nutrition guidelines to follow: Cholesterol < 200mg /day Sodium < 1500mg /day Fiber: Men over 50 yrs - 30 grams per day  Personal Goals:     Personal Goals and Risk Factors at Admission - 04/26/15 1646    Core Components/Risk Factors/Patient Goals on Admission    Weight Management Yes   Intervention Weight Management/Obesity: Establish reasonable short term and long term weight goals.;Weight Management: Provide education and appropriate resources to help participant work on and attain dietary goals.   Admit Weight 231 lb 11.2 oz (105.098 kg)   Goal Weight: Short Term 200 lb (90.719 kg)   Goal Weight: Long Term 190 lb (86.183 kg)   Sedentary --  Walking daily   Tobacco Cessation --  None   Improve shortness of breath with ADL's --  N/A   Develop more efficient breathing techniques such as purse lipped breathing and diaphragmatic breathing; and practicing self-pacing with activity --  Not at this time   Increase knowledge of respiratory medications and ability to use respiratory devices properly  --  N/A   Diabetes --  Patient is already managing his diabetes.    Hypertension Yes   Intervention Monitor prescription use compliance.   Expected Outcomes Long Term: Maintenance of blood pressure at goal levels.   Lipids Yes   Intervention Provide education and support for participant on nutrition & aerobic/resistive exercise along with prescribed medications to achieve LDL 70mg , HDL >40mg .   Expected Outcomes Short Term: Participant states understanding of desired cholesterol values and is compliant with medications  prescribed. Participant is following exercise prescription and nutrition guidelines.;Long Term: Cholesterol controlled with medications as prescribed, with individualized exercise RX and with personalized nutrition plan. Value goals: LDL < 70mg , HDL > 40 mg.   Stress --  N/A   Personal Goal Other Yes   Personal Goal Increase Stamina, Get back to normal, play with grandchildren and play golf   Intervention Work towards this personal goals incrementally throughout 12 weeks   Expected Outcomes Get back to golf course by June of program in May, lose 10-15lbs by May      Tobacco Use Initial Evaluation: History  Smoking status  . Former Smoker  . Types: Cigars  . Quit date: 12/31/2014  Smokeless tobacco  . Never Used    Copy of goals given to participant.  Personal Goals Discharge (Final Personal Goals and Risk Factors Review):    ITP Comments:   Comments: Patient arrived for 1st visit/orientation/education at 2:45pm. Patient was referred to CR by [name] due to CABGx5 Z95.1. During orientation advised patient on arrival and appointment times what to wear, what to do before, during and after exercise. Reviewed attendance and class policy. Talked about inclement weather and class consultation policy. Pt is scheduled to return Cardiac Rehab on 05/02/15 at 3:45 pm. Pt was advised to come to class 15 minutes before class starts. He was also given instructions on meeting with the dietician and attending the Family Structure classes. Pt is eager to get started. Patient was able to complete 6 minute walk test. Patient was measured for the equipment. Discussed equipment safety with patient. Took patient pre-anthropometric measurements. Patient finished visit at 4:48pm.

## 2015-04-26 NOTE — Progress Notes (Signed)
Cardiac/Pulmonary Rehab Medication Review by a Pharmacist  Does the patient  feel that his/her medications are working for him/her?  yes  Has the patient been experiencing any side effects to the medications prescribed?  yes  Does the patient measure his/her own blood pressure or blood glucose at home?  yes (both)  Does the patient have any problems obtaining medications due to transportation or finances?   no  Understanding of regimen: excellent Understanding of indications: excellent Potential of compliance: good (seldom misses doses, sometimes varies times are given)  Pharmacist comments: Good understanding and compliance.  No further questions.  Henry Carroll 04/26/2015 3:37 PM

## 2015-04-27 ENCOUNTER — Ambulatory Visit: Payer: Medicare Other | Admitting: Cardiology

## 2015-05-02 ENCOUNTER — Encounter (HOSPITAL_COMMUNITY)
Admission: RE | Admit: 2015-05-02 | Discharge: 2015-05-02 | Disposition: A | Discharge status: Home / Self Care | Payer: Medicare Other | Source: Ambulatory Visit | Attending: Cardiology | Admitting: Cardiology

## 2015-05-02 DIAGNOSIS — Z951 Presence of aortocoronary bypass graft: Secondary | ICD-10-CM | POA: Diagnosis not present

## 2015-05-02 DIAGNOSIS — I2510 Atherosclerotic heart disease of native coronary artery without angina pectoris: Secondary | ICD-10-CM | POA: Diagnosis not present

## 2015-05-03 ENCOUNTER — Other Ambulatory Visit: Payer: Self-pay | Admitting: Internal Medicine

## 2015-05-04 ENCOUNTER — Encounter (HOSPITAL_COMMUNITY)
Admission: RE | Admit: 2015-05-04 | Discharge: 2015-05-04 | Disposition: A | Discharge status: Home / Self Care | Payer: Medicare Other | Source: Ambulatory Visit | Attending: Cardiology | Admitting: Cardiology

## 2015-05-04 ENCOUNTER — Ambulatory Visit: Payer: Medicare Other | Admitting: Cardiology

## 2015-05-04 DIAGNOSIS — I2510 Atherosclerotic heart disease of native coronary artery without angina pectoris: Secondary | ICD-10-CM | POA: Diagnosis not present

## 2015-05-04 DIAGNOSIS — Z951 Presence of aortocoronary bypass graft: Secondary | ICD-10-CM | POA: Diagnosis not present

## 2015-05-06 ENCOUNTER — Encounter (HOSPITAL_COMMUNITY)
Admission: RE | Admit: 2015-05-06 | Discharge: 2015-05-06 | Disposition: A | Discharge status: Home / Self Care | Payer: Medicare Other | Source: Ambulatory Visit | Attending: Cardiology | Admitting: Cardiology

## 2015-05-06 DIAGNOSIS — I2510 Atherosclerotic heart disease of native coronary artery without angina pectoris: Secondary | ICD-10-CM | POA: Diagnosis not present

## 2015-05-06 DIAGNOSIS — Z951 Presence of aortocoronary bypass graft: Secondary | ICD-10-CM | POA: Diagnosis not present

## 2015-05-09 ENCOUNTER — Encounter (HOSPITAL_COMMUNITY)
Admission: RE | Admit: 2015-05-09 | Discharge: 2015-05-09 | Disposition: A | Discharge status: Home / Self Care | Payer: Medicare Other | Source: Ambulatory Visit | Attending: Cardiology | Admitting: Cardiology

## 2015-05-09 DIAGNOSIS — Z951 Presence of aortocoronary bypass graft: Secondary | ICD-10-CM | POA: Diagnosis not present

## 2015-05-09 DIAGNOSIS — I2510 Atherosclerotic heart disease of native coronary artery without angina pectoris: Secondary | ICD-10-CM | POA: Diagnosis not present

## 2015-05-09 NOTE — Progress Notes (Signed)
Cardiac Individual Treatment Plan  Patient Details  Name: Henry Carroll MRN: [id number] Date of Birth: [birth year] Referring Provider:  Jerline Pain, MD  Initial Encounter Date:       CARDIAC REHAB PHASE II ORIENTATION from 04/26/2015 in [place]   Date  04/26/15      Visit Diagnosis: No diagnosis found.  Patient's Home Medications on Admission:  Current outpatient prescriptions:  .  acetaminophen (TYLENOL) 500 MG tablet, Take 1,000 mg by mouth 2 (two) times daily as needed for moderate pain., Disp: , Rfl:  .  albuterol (PROVENTIL) (2.5 MG/3ML) 0.083% nebulizer solution, USE ONE VIAL (3 ML) IN NEBULIZER EVERY 6 HOURS AS NEEDED FOR WHEEZING OR SHORTNESS OF BREATH, Disp: 300 mL, Rfl: 11 .  amLODipine (NORVASC) 10 MG tablet, Take 1 tablet (10 mg total) by mouth daily., Disp: 30 tablet, Rfl: 6 .  aspirin EC 325 MG EC tablet, Take 1 tablet (325 mg total) by mouth daily., Disp: , Rfl:  .  atorvastatin (LIPITOR) 40 MG tablet, Take 1 tablet (40 mg total) by mouth every evening., Disp: 30 tablet, Rfl: 6 .  benazepril (LOTENSIN) 20 MG tablet, Take 10 mg by mouth daily. , Disp: , Rfl:  .  carvedilol (COREG) 12.5 MG tablet, Take 1 tablet (12.5 mg total) by mouth 2 (two) times daily with a meal., Disp: 60 tablet, Rfl: 6 .  Cholecalciferol ([employer] VITAMIN D3) 1000 UNITS capsule, Take 2 capsules (2,000 Units total) by mouth daily., Disp: 60 capsule, Rfl: 3 .  CINNAMON PO, Take 1 tablet by mouth daily., Disp: , Rfl:  .  Coenzyme Q10 (CO Q 10 PO), Take 1 tablet by mouth daily., Disp: , Rfl:  .  Fluticasone-Salmeterol (ADVAIR DISKUS) 100-50 MCG/DOSE AEPB, Inhale 1 puff into the lungs 2 (two) times daily., Disp: , Rfl:  .  furosemide (LASIX) 40 MG tablet, Take 1 tablet (40 mg total) by mouth daily. (Patient taking differently: Take 40 mg by mouth 2 (two) times daily. ), Disp: 30 tablet, Rfl: 0 .  hydrALAZINE (APRESOLINE) 25 MG tablet, Take 1 tablet (25 mg total) by mouth 3  (three) times daily., Disp: 180 tablet, Rfl: 5 .  insulin detemir (LEVEMIR) 100 UNIT/ML injection, Inject 0.5 mLs (50 Units total) into the skin 2 (two) times daily., Disp: 30 mL, Rfl: 11 .  linagliptin (TRADJENTA) 5 MG TABS tablet, Take 5 mg by mouth daily., Disp: , Rfl:  .  montelukast (SINGULAIR) 10 MG tablet, Take 10 mg by mouth at bedtime., Disp: , Rfl:  .  PROAIR HFA 108 (90 BASE) MCG/ACT inhaler, INHALE TWO PUFFS BY MOUTH EVERY 6 HOURS AS NEEDED FOR SHORTNESS OF BREATH, Disp: 27 each, Rfl: 3 .  terbinafine (LAMISIL) 1 % cream, Apply 1 application topically daily as needed (foot). , Disp: , Rfl:  .  TRADJENTA 5 MG TABS tablet, TAKE ONE TABLET BY MOUTH ONCE DAILY, Disp: 30 tablet, Rfl: 3  Past Medical History: Past Medical History  Diagnosis Date  . Diabetes mellitus   . Asthma   . Hypertension   . Anemia   . Anxiety   . Hyperlipidemia   . CKD (chronic kidney disease), stage III   . Chronic combined systolic (congestive) and diastolic (congestive) heart failure ([place])     a. 12/31/14: 2D ECHO: EF 40-45%, HK of inf myocardium, G1DD, mod MR  . Obesity     a. BMI 33  . Coronary artery disease     a. LHC 01/2015 -  triple vessel CAD (mod oLM, mLAD, severe mRCA, intermediate branch stenosis, CTO of mCx). Plan CABG 02/2015.  Marland Kitchen NSVT (nonsustained ventricular tachycardia) ([facility])     a. 9 beats during 01/2015 adm. BB titrated.  . Mitral regurgitation     a. Mild-mod by echo 12/2014.  Marland Kitchen Myocardial infarction [facility])     pt. states per [name] he has in the past.  . 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 Former Smoker  . Types: Cigars  . Quit date: 12/31/2014  Smokeless tobacco  . Never Used    Labs: Recent Review Flowsheet Data    Labs for ITP Cardiac and Pulmonary Rehab Latest Ref Rng 02/18/2015 02/18/2015 02/18/2015 02/18/2015 02/19/2015   PHART 7.350 - 7.450 7.373 7.320(L) 7.315(L) - -   PCO2ART 35.0 - 45.0 mmHg 41.2 43.1 44.4 - -   HCO3 20.0 -  24.0 mEq/L 23.9 22.2 22.7 - -   TCO2 0 - 100 mmol/L 25 23 24 23 21    ACIDBASEDEF 0.0 - 2.0 mmol/L 1.0 4.0(H) 3.0(H) - -   O2SAT - 99.0 97.0 96.0 - -      Capillary Blood Glucose: Lab Results  Component Value Date   GLUCAP 202* 02/22/2015   GLUCAP 53* 02/22/2015   GLUCAP 40* 02/22/2015   GLUCAP 181* 02/21/2015   GLUCAP 139* 02/21/2015     Exercise Target Goals:    Exercise Program Goal: Individual exercise prescription set with THRR, safety & activity barriers. Participant demonstrates ability to understand and report RPE using BORG scale, to self-measure pulse accurately, and to acknowledge the importance of the exercise prescription.  Exercise Prescription Goal: Starting with aerobic activity 30 plus minutes a day, 3 days per week for initial exercise prescription. Provide home exercise prescription and guidelines that participant acknowledges understanding prior to discharge.  Activity Barriers & Risk Stratification:     Activity Barriers & Cardiac Risk Stratification - 04/26/15 1701    Activity Barriers & Cardiac Risk Stratification   Activity Barriers None   Cardiac Risk Stratification High      6 Minute Walk:     6 Minute Walk      04/26/15 1549       6 Minute Walk   Phase Initial     Distance 1400 feet     Walk Time 6 minutes     # of Rest Breaks 0     MPH 2.65     METS 3.03     RPE 9     Perceived Dyspnea  9     VO2 Peak 12.21     Symptoms No     Resting HR 95 bpm     Resting BP 158/92 mmHg     Max Ex. HR 114 bpm     Max Ex. BP 158/78 mmHg     2 Minute Post BP 128/80 mmHg     Pre/Post BP   Baseline BP 158/92 mmHg     6 Minute BP 158/78 mmHg     Pre/Post BP? Yes        Initial Exercise Prescription:     Initial Exercise Prescription - 04/26/15 1600    Date of Initial Exercise Prescription   Date 04/26/15   Treadmill   MPH 2   Grade 0   Minutes 15   METs 2.53   NuStep   Level 2   Minutes 15   METs 1.5  Prescription Details    Frequency (times per week) 3   Intensity   THRR REST +  30   THRR 40-80% of Max Heartrate 118-130   Ratings of Perceived Exertion 11-13   Progression   Progression Continue to progress workloads to maintain intensity without signs/symptoms of physical distress.   Resistance Training   Training Prescription Yes   Weight 1.0   Reps 10-12      Perform Capillary Blood Glucose checks as needed.  Exercise Prescription Changes:     Exercise Prescription Changes      05/09/15 1300           Exercise Review   Progression Yes       Response to Exercise   Blood Pressure (Admit) 140/78 mmHg       Blood Pressure (Exercise) 140/80 mmHg       Blood Pressure (Exit) 138/78 mmHg       Heart Rate (Admit) 59 bpm       Heart Rate (Exercise) 96 bpm       Heart Rate (Exit) 68 bpm       Rating of Perceived Exertion (Exercise) 10       Symptoms No       Progression   Progression Continue to progress workloads to maintain intensity without signs/symptoms of physical distress.       Resistance Training   Training Prescription Yes       Weight 2.0       Reps 10-12       Interval Training   Interval Training No       Treadmill   MPH 2       Grade 0       Minutes 15       METs 2.53       NuStep   Level 2       Minutes 15       METs 2.2          Exercise Comments:    Discharge Exercise Prescription (Final Exercise Prescription Changes):     Exercise Prescription Changes - 05/09/15 1300    Exercise Review   Progression Yes   Response to Exercise   Blood Pressure (Admit) 140/78 mmHg   Blood Pressure (Exercise) 140/80 mmHg   Blood Pressure (Exit) 138/78 mmHg   Heart Rate (Admit) 59 bpm   Heart Rate (Exercise) 96 bpm   Heart Rate (Exit) 68 bpm   Rating of Perceived Exertion (Exercise) 10   Symptoms No   Progression   Progression Continue to progress workloads to maintain intensity without signs/symptoms of physical distress.   Resistance Training   Training Prescription Yes    Weight 2.0   Reps 10-12   Interval Training   Interval Training No   Treadmill   MPH 2   Grade 0   Minutes 15   METs 2.53   NuStep   Level 2   Minutes 15   METs 2.2      Nutrition:  Target Goals: Understanding of nutrition guidelines, daily intake of sodium 1500mg , cholesterol 200mg , calories 30% from fat and 7% or less from saturated fats, daily to have 5 or more servings of fruits and vegetables.  Biometrics:     Pre Biometrics - 04/26/15 1616    Pre Biometrics   Height 6\' 1"  (1.854 m)   Weight 231 lb 11.2 oz (105.098 kg)   Waist Circumference 42.5 inches   Hip Circumference 41 inches   Waist  to Hip Ratio 1.04 %   BMI (Calculated) 30.6   Triceps Skinfold 5 mm   % Body Fat 26.1 %   Grip Strength 88 kg   Flexibility 0 in   Single Leg Stand 40 seconds       Nutrition Therapy Plan and Nutrition Goals:     Nutrition Therapy & Goals - 04/26/15 1645    Nutrition Therapy   Diet Heart healthy, diabetic healthy, low sodium   Intervention Plan   Intervention Prescribe, educate and counsel regarding individualized specific dietary modifications aiming towards targeted core components such as weight, hypertension, lipid management, diabetes, heart failure and other comorbidities.   Expected Outcomes Long Term Goal: Adherence to prescribed nutrition plan.      Nutrition Discharge: Rate Your Plate Scores:     Nutrition Assessments - 04/26/15 1708    MEDFICTS Scores   Pre Score 60      Nutrition Goals Re-Evaluation:     Nutrition Goals Re-Evaluation      05/09/15 1516           Intervention Plan   Intervention Continue to educate, counsel and set short/long term goals regarding individualized specific personal dietary modifications.          Psychosocial: Target Goals: Acknowledge presence or absence of depression, maximize coping skills, provide positive support system. Participant is able to verbalize types and ability to use techniques and skills  needed for reducing stress and depression.  Initial Review & Psychosocial Screening:     Initial Psych Review & Screening - 04/26/15 1655    Initial Review   Current issues with --  None   Family Dynamics   Good Support System? Yes   Concerns --  None   Barriers   Psychosocial barriers to participate in program There are no identifiable barriers or psychosocial needs.   Screening Interventions   Interventions --  Will follow guidelines to regain heart health      Quality of Life Scores:     Quality of Life - 04/26/15 1617    Quality of Life Scores   Health/Function Pre 25.29 %   Socioeconomic Pre 28.21 %   Psych/Spiritual Pre 26.75 %   Family Pre 28.5 %   GLOBAL Pre 26.65 %      PHQ-9:     Recent Review Flowsheet Data    Depression screen [facility] 2/9 04/26/2015 12/29/2014 12/01/2014 12/22/2013 11/10/2013   Decreased Interest 0 0 0 0 0   Down, Depressed, Hopeless 0 0 0 0 0   PHQ - 2 Score 0 0 0 0 0   Altered sleeping 0 - - - -   Tired, decreased energy 0 - - - -   Change in appetite 0 - - - -   Feeling bad or failure about yourself  0 - - - -   Trouble concentrating 0 - - - -   Moving slowly or fidgety/restless 0 - - - -   Suicidal thoughts 0 - - - -   PHQ-9 Score 0 - - - -   Difficult doing work/chores Not difficult at all - - - -      Psychosocial Evaluation and Intervention:   Psychosocial Re-Evaluation:     Psychosocial Re-Evaluation      05/09/15 1517           Psychosocial Re-Evaluation   Interventions Encouraged to attend Cardiac Rehabilitation for the exercise       Continued Psychosocial Services Needed No  Vocational Rehabilitation: Provide vocational rehab assistance to qualifying candidates.   Vocational Rehab Evaluation & Intervention:     Vocational Rehab - 04/26/15 1703    Initial Vocational Rehab Evaluation & Intervention   Assessment shows need for Vocational Rehabilitation No      Education: Education Goals:  Education classes will be provided on a weekly basis, covering required topics. Participant will state understanding/return demonstration of topics presented.  Learning Barriers/Preferences:     Learning Barriers/Preferences - 04/26/15 1701    Learning Barriers/Preferences   Learning Barriers None   Learning Preferences Written Material      Education Topics: Hypertension, Hypertension Reduction -Define heart disease and high blood pressure. Discus how high blood pressure affects the body and ways to reduce high blood pressure.   Exercise and Your Heart -Discuss why it is important to exercise, the FITT principles of exercise, normal and abnormal responses to exercise, and how to exercise safely.   Angina -Discuss definition of angina, causes of angina, treatment of angina, and how to decrease risk of having angina.   Cardiac Medications -Review what the following cardiac medications are used for, how they affect the body, and side effects that may occur when taking the medications.  Medications include Aspirin, Beta blockers, calcium channel blockers, ACE Inhibitors, angiotensin receptor blockers, diuretics, digoxin, and antihyperlipidemics.   Congestive Heart Failure -Discuss the definition of CHF, how to live with CHF, the signs and symptoms of CHF, and how keep track of weight and sodium intake.   Heart Disease and Intimacy -Discus the effect sexual activity has on the heart, how changes occur during intimacy as we age, and safety during sexual activity.   Smoking Cessation / COPD -Discuss different methods to quit smoking, the health benefits of quitting smoking, and the definition of COPD.   Nutrition I: Fats -Discuss the types of cholesterol, what cholesterol does to the heart, and how cholesterol levels can be controlled.          CARDIAC REHAB PHASE II EXERCISE from 05/04/2015 in [place]   Date  05/04/15   Educator  Russella Dar    Instruction Review Code  2- meets goals/outcomes      Nutrition II: Labels -Discuss the different components of food labels and how to read food label   Heart Parts and Heart Disease -Discuss the anatomy of the heart, the pathway of blood circulation through the heart, and these are affected by heart disease.   Stress I: Signs and Symptoms -Discuss the causes of stress, how stress may lead to anxiety and depression, and ways to limit stress.   Stress II: Relaxation -Discuss different types of relaxation techniques to limit stress.   Warning Signs of Stroke / TIA -Discuss definition of a stroke, what the signs and symptoms are of a stroke, and how to identify when someone is having stroke.   Knowledge Questionnaire Score:     Knowledge Questionnaire Score - 04/26/15 1702    Knowledge Questionnaire Score   Pre Score 21/24      Core Components/Risk Factors/Patient Goals at Admission:     Personal Goals and Risk Factors at Admission - 04/26/15 1646    Core Components/Risk Factors/Patient Goals on Admission    Weight Management Yes   Intervention Weight Management/Obesity: Establish reasonable short term and long term weight goals.;Weight Management: Provide education and appropriate resources to help participant work on and attain dietary goals.   Admit Weight 231 lb 11.2 oz (105.098 kg)  Goal Weight: Short Term 200 lb (90.719 kg)   Goal Weight: Long Term 190 lb (86.183 kg)   Sedentary --  Walking daily   Tobacco Cessation --  None   Improve shortness of breath with ADL's --  N/A   Develop more efficient breathing techniques such as purse lipped breathing and diaphragmatic breathing; and practicing self-pacing with activity --  Not at this time   Increase knowledge of respiratory medications and ability to use respiratory devices properly  --  N/A   Diabetes --  Patient is already managing his diabetes.    Hypertension Yes   Intervention Monitor prescription use  compliance.   Expected Outcomes Long Term: Maintenance of blood pressure at goal levels.   Lipids Yes   Intervention Provide education and support for participant on nutrition & aerobic/resistive exercise along with prescribed medications to achieve LDL 70mg , HDL >40mg .   Expected Outcomes Short Term: Participant states understanding of desired cholesterol values and is compliant with medications prescribed. Participant is following exercise prescription and nutrition guidelines.;Long Term: Cholesterol controlled with medications as prescribed, with individualized exercise RX and with personalized nutrition plan. Value goals: LDL < 70mg , HDL > 40 mg.   Stress --  N/A   Personal Goal Other Yes   Personal Goal Increase Stamina, Get back to normal, play with grandchildren and play golf   Intervention Work towards this personal goals incrementally throughout 12 weeks   Expected Outcomes Get back to golf course by June of program in May, lose 10-15lbs by May      Core Components/Risk Factors/Patient Goals Review:      Goals and Risk Factor Review      05/09/15 1516           Core Components/Risk Factors/Patient Goals Review   Personal Goals Review Weight Management/Obesity;Hypertension;Lipids;Other       Review Will continue to monitor and educate patient          Core Components/Risk Factors/Patient Goals at Discharge (Final Review):      Goals and Risk Factor Review - 05/09/15 1516    Core Components/Risk Factors/Patient Goals Review   Personal Goals Review Weight Management/Obesity;Hypertension;Lipids;Other   Review Will continue to monitor and educate patient      ITP Comments:   Comments: Patients goals and exercise prescription reviewed.

## 2015-05-11 ENCOUNTER — Encounter (HOSPITAL_COMMUNITY)
Admission: RE | Admit: 2015-05-11 | Discharge: 2015-05-11 | Disposition: A | Discharge status: Home / Self Care | Payer: Medicare Other | Source: Ambulatory Visit | Attending: Cardiology | Admitting: Cardiology

## 2015-05-11 DIAGNOSIS — I2510 Atherosclerotic heart disease of native coronary artery without angina pectoris: Secondary | ICD-10-CM | POA: Diagnosis not present

## 2015-05-11 DIAGNOSIS — Z951 Presence of aortocoronary bypass graft: Secondary | ICD-10-CM | POA: Diagnosis not present

## 2015-05-13 ENCOUNTER — Encounter (HOSPITAL_COMMUNITY)
Admission: RE | Admit: 2015-05-13 | Discharge: 2015-05-13 | Disposition: A | Discharge status: Home / Self Care | Payer: Medicare Other | Source: Ambulatory Visit | Attending: Cardiology | Admitting: Cardiology

## 2015-05-13 DIAGNOSIS — I2510 Atherosclerotic heart disease of native coronary artery without angina pectoris: Secondary | ICD-10-CM | POA: Diagnosis not present

## 2015-05-13 DIAGNOSIS — Z951 Presence of aortocoronary bypass graft: Secondary | ICD-10-CM | POA: Diagnosis not present

## 2015-05-16 ENCOUNTER — Encounter (HOSPITAL_COMMUNITY)
Admission: RE | Admit: 2015-05-16 | Discharge: 2015-05-16 | Disposition: A | Discharge status: Home / Self Care | Payer: Medicare Other | Source: Ambulatory Visit | Attending: Cardiology | Admitting: Cardiology

## 2015-05-16 DIAGNOSIS — Z951 Presence of aortocoronary bypass graft: Secondary | ICD-10-CM | POA: Insufficient documentation

## 2015-05-16 DIAGNOSIS — I2510 Atherosclerotic heart disease of native coronary artery without angina pectoris: Secondary | ICD-10-CM | POA: Insufficient documentation

## 2015-05-18 ENCOUNTER — Encounter (HOSPITAL_COMMUNITY)
Admission: RE | Admit: 2015-05-18 | Discharge: 2015-05-18 | Disposition: A | Discharge status: Home / Self Care | Payer: Medicare Other | Source: Ambulatory Visit | Attending: Cardiology | Admitting: Cardiology

## 2015-05-18 DIAGNOSIS — I2510 Atherosclerotic heart disease of native coronary artery without angina pectoris: Secondary | ICD-10-CM | POA: Diagnosis not present

## 2015-05-18 DIAGNOSIS — Z951 Presence of aortocoronary bypass graft: Secondary | ICD-10-CM | POA: Diagnosis not present

## 2015-05-20 ENCOUNTER — Encounter (HOSPITAL_COMMUNITY)
Admission: RE | Admit: 2015-05-20 | Discharge: 2015-05-20 | Disposition: A | Discharge status: Home / Self Care | Payer: Medicare Other | Source: Ambulatory Visit | Attending: Cardiology | Admitting: Cardiology

## 2015-05-20 DIAGNOSIS — Z951 Presence of aortocoronary bypass graft: Secondary | ICD-10-CM | POA: Diagnosis not present

## 2015-05-20 DIAGNOSIS — I2510 Atherosclerotic heart disease of native coronary artery without angina pectoris: Secondary | ICD-10-CM | POA: Diagnosis not present

## 2015-05-23 ENCOUNTER — Encounter (HOSPITAL_COMMUNITY): Payer: Medicare Other

## 2015-05-25 ENCOUNTER — Encounter (HOSPITAL_COMMUNITY)
Admission: RE | Admit: 2015-05-25 | Discharge: 2015-05-25 | Disposition: A | Discharge status: Home / Self Care | Payer: Medicare Other | Source: Ambulatory Visit | Attending: Cardiology | Admitting: Cardiology

## 2015-05-25 DIAGNOSIS — Z951 Presence of aortocoronary bypass graft: Secondary | ICD-10-CM | POA: Diagnosis not present

## 2015-05-25 DIAGNOSIS — I2510 Atherosclerotic heart disease of native coronary artery without angina pectoris: Secondary | ICD-10-CM | POA: Diagnosis not present

## 2015-05-27 ENCOUNTER — Encounter (HOSPITAL_COMMUNITY)
Admission: RE | Admit: 2015-05-27 | Discharge: 2015-05-27 | Disposition: A | Discharge status: Home / Self Care | Payer: Medicare Other | Source: Ambulatory Visit | Attending: Cardiology | Admitting: Cardiology

## 2015-05-27 DIAGNOSIS — I2510 Atherosclerotic heart disease of native coronary artery without angina pectoris: Secondary | ICD-10-CM | POA: Diagnosis not present

## 2015-05-27 DIAGNOSIS — Z951 Presence of aortocoronary bypass graft: Secondary | ICD-10-CM | POA: Diagnosis not present

## 2015-05-30 ENCOUNTER — Encounter (HOSPITAL_COMMUNITY)
Admission: RE | Admit: 2015-05-30 | Discharge: 2015-05-30 | Disposition: A | Discharge status: Home / Self Care | Payer: Medicare Other | Source: Ambulatory Visit | Attending: Cardiology | Admitting: Cardiology

## 2015-05-30 DIAGNOSIS — Z951 Presence of aortocoronary bypass graft: Secondary | ICD-10-CM | POA: Diagnosis not present

## 2015-05-30 DIAGNOSIS — I2510 Atherosclerotic heart disease of native coronary artery without angina pectoris: Secondary | ICD-10-CM | POA: Diagnosis not present

## 2015-05-31 DIAGNOSIS — N183 Chronic kidney disease, stage 3 (moderate): Secondary | ICD-10-CM | POA: Diagnosis not present

## 2015-05-31 DIAGNOSIS — E1129 Type 2 diabetes mellitus with other diabetic kidney complication: Secondary | ICD-10-CM | POA: Diagnosis not present

## 2015-05-31 DIAGNOSIS — D472 Monoclonal gammopathy: Secondary | ICD-10-CM | POA: Diagnosis not present

## 2015-05-31 DIAGNOSIS — I10 Essential (primary) hypertension: Secondary | ICD-10-CM | POA: Diagnosis not present

## 2015-05-31 DIAGNOSIS — R809 Proteinuria, unspecified: Secondary | ICD-10-CM | POA: Diagnosis not present

## 2015-06-01 ENCOUNTER — Encounter (HOSPITAL_COMMUNITY)
Admission: RE | Admit: 2015-06-01 | Discharge: 2015-06-01 | Disposition: A | Discharge status: Home / Self Care | Payer: Medicare Other | Source: Ambulatory Visit | Attending: Cardiology | Admitting: Cardiology

## 2015-06-01 ENCOUNTER — Other Ambulatory Visit: Payer: Federal, State, Local not specified - PPO

## 2015-06-01 DIAGNOSIS — E1165 Type 2 diabetes mellitus with hyperglycemia: Secondary | ICD-10-CM | POA: Diagnosis not present

## 2015-06-01 DIAGNOSIS — E785 Hyperlipidemia, unspecified: Secondary | ICD-10-CM

## 2015-06-01 DIAGNOSIS — I10 Essential (primary) hypertension: Secondary | ICD-10-CM | POA: Diagnosis not present

## 2015-06-01 DIAGNOSIS — IMO0002 Reserved for concepts with insufficient information to code with codable children: Secondary | ICD-10-CM

## 2015-06-01 DIAGNOSIS — E1121 Type 2 diabetes mellitus with diabetic nephropathy: Secondary | ICD-10-CM | POA: Diagnosis not present

## 2015-06-01 DIAGNOSIS — Z951 Presence of aortocoronary bypass graft: Secondary | ICD-10-CM | POA: Diagnosis not present

## 2015-06-01 DIAGNOSIS — I2510 Atherosclerotic heart disease of native coronary artery without angina pectoris: Secondary | ICD-10-CM | POA: Diagnosis not present

## 2015-06-02 LAB — HEMOGLOBIN A1C
ESTIMATED AVERAGE GLUCOSE: 186 mg/dL
HEMOGLOBIN A1C: 8.1 % — AB (ref 4.8–5.6)

## 2015-06-02 LAB — LIPID PANEL
CHOLESTEROL TOTAL: 149 mg/dL (ref 100–199)
Chol/HDL Ratio: 3.3 ratio units (ref 0.0–5.0)
HDL: 45 mg/dL (ref >39)
LDL CALC: 84 mg/dL (ref 0–99)
Triglycerides: 100 mg/dL (ref 0–149)
VLDL Cholesterol Cal: 20 mg/dL (ref 5–40)

## 2015-06-02 LAB — BASIC METABOLIC PANEL
BUN/Creatinine Ratio: 21 (ref 10–24)
BUN: 36 mg/dL — AB (ref 8–27)
CALCIUM: 9.8 mg/dL (ref 8.6–10.2)
CHLORIDE: 101 mmol/L (ref 96–106)
CO2: 25 mmol/L (ref 18–29)
CREATININE: 1.7 mg/dL — AB (ref 0.76–1.27)
GFR calc Af Amer: 47 mL/min/{1.73_m2} — ABNORMAL LOW (ref >59)
GFR calc non Af Amer: 41 mL/min/{1.73_m2} — ABNORMAL LOW (ref >59)
GLUCOSE: 59 mg/dL — AB (ref 65–99)
Potassium: 4.8 mmol/L (ref 3.5–5.2)
Sodium: 142 mmol/L (ref 134–144)

## 2015-06-02 LAB — ALT: ALT: 18 IU/L (ref 0–44)

## 2015-06-03 ENCOUNTER — Encounter (HOSPITAL_COMMUNITY): Payer: Medicare Other

## 2015-06-03 ENCOUNTER — Ambulatory Visit (INDEPENDENT_AMBULATORY_CARE_PROVIDER_SITE_OTHER): Payer: Medicare Other | Admitting: Internal Medicine

## 2015-06-03 ENCOUNTER — Encounter: Payer: Self-pay | Admitting: Internal Medicine

## 2015-06-03 VITALS — BP 140/68 | HR 75 | Temp 98.0°F | Resp 20 | Ht 73.0 in | Wt 238.8 lb

## 2015-06-03 DIAGNOSIS — I2510 Atherosclerotic heart disease of native coronary artery without angina pectoris: Secondary | ICD-10-CM | POA: Diagnosis not present

## 2015-06-03 DIAGNOSIS — N183 Chronic kidney disease, stage 3 unspecified: Secondary | ICD-10-CM

## 2015-06-03 DIAGNOSIS — E1121 Type 2 diabetes mellitus with diabetic nephropathy: Secondary | ICD-10-CM

## 2015-06-03 DIAGNOSIS — I255 Ischemic cardiomyopathy: Secondary | ICD-10-CM

## 2015-06-03 DIAGNOSIS — I10 Essential (primary) hypertension: Secondary | ICD-10-CM | POA: Diagnosis not present

## 2015-06-03 DIAGNOSIS — E1165 Type 2 diabetes mellitus with hyperglycemia: Secondary | ICD-10-CM

## 2015-06-03 DIAGNOSIS — J4520 Mild intermittent asthma, uncomplicated: Secondary | ICD-10-CM

## 2015-06-03 DIAGNOSIS — IMO0002 Reserved for concepts with insufficient information to code with codable children: Secondary | ICD-10-CM

## 2015-06-03 DIAGNOSIS — E785 Hyperlipidemia, unspecified: Secondary | ICD-10-CM

## 2015-06-03 NOTE — Patient Instructions (Signed)
Continue current medications as ordered  Follow up with cardiac rehab as scheduled  Follow up with cardiology as scheduled  Follow up in 3 mos for routine visit. Fasting labs prior to appt

## 2015-06-03 NOTE — Progress Notes (Signed)
Location:    [facility]   Place of Service:  OFFICE   Chief Complaint  Patient presents with  . Medical Management of Chronic Issues    3 mo f/u    HPI:  65 yo male seen today for f/u. He is still going to cardiac rehab and has 7 more weeks. He reports feeling better. Strength returning. Able to walk 1.5 miles without getting SOB. No issues with stair climbing.  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 Vein harvested from right leg. ABIs 0.62 on right and 0.28 on left. Cr 1.8 and Hgb 8.9 at d/c. BP meds adjusted. Followed by cardio Dr Marlou Porch. Released from CT surgery.   DM - BS at home 110s fasting, at night 180-210. No low BS reactions. He stopped using novolog. No numbness or tingling. Takes tradjenta and levemir. Uses 45 units levemir due to frequent low BS reactions in AM. A1c 8.1%  HTN - stable. No muscle cramps since last OV. Takes lasix, coreg, hydralazine, amlodipine. Takes ASA daily  Hyperlipidemia - stable on statin. No myalgias  Asthma - no attacks in the last yr. He takes advair and albuterol neb. Takes singulair  CKD - followed by nephrology Dr Mercy Moore   Past Medical History  Diagnosis Date  . Diabetes mellitus   . Asthma   . Hypertension   . Anemia   . Anxiety   . Hyperlipidemia   . CKD (chronic kidney disease), stage III   . Chronic combined systolic (congestive) and diastolic (congestive) heart failure ([place])     a. 12/31/14: 2D ECHO: EF 40-45%, HK of inf myocardium, G1DD, mod MR  . Obesity     a. BMI 33  . Coronary artery disease     a. LHC 01/2015 - triple vessel CAD (mod oLM, mLAD, severe mRCA, intermediate branch stenosis, CTO of mCx). Plan CABG 02/2015.  Marland Kitchen NSVT (nonsustained ventricular tachycardia) (HCC)     a. 9 beats during 01/2015 adm. BB titrated.  . Mitral regurgitation     a. Mild-mod by echo 12/2014.  Marland Kitchen Myocardial infarction [facility])     pt. states per [name] he has in the past.  . Arthritis     left  5th  finger    Past Surgical History  Procedure Laterality Date  . Tonsillectomy  1962  . Hernia repair  0000000    Umbilical  . Spine surgery  2006    L4 & L5  . Colon surgery  2011    Colonoscopy  . Cardiac catheterization N/A 02/09/2015    Procedure: Left Heart Cath and Coronary Angiography;  Surgeon: Burnell Blanks, MD;  Location: [facility];  Service: Cardiovascular;  Laterality: N/A;  . Coronary artery bypass graft N/A 02/18/2015    Procedure: CORONARY ARTERY BYPASS GRAFTING (CABG) x  four, using bilateral internal mammary arteries and right leg greater saphenous vein harvested endoscopically;  Surgeon: [name], MD;  Location: [place];  Service: Open Heart Surgery;  Laterality: N/A;  . Tee without cardioversion N/A 02/18/2015    Procedure: TRANSESOPHAGEAL ECHOCARDIOGRAM (TEE);  Surgeon: [name], MD;  Location: [place];  Service: Open Heart Surgery;  Laterality: N/A;    Patient Care Team: Gildardo Cranker, DO as PCP - General (Internal Medicine)  Social History   Social History  . Marital Status: Married    Spouse Name: N/A  . Number of Children: N/A  . Years of Education:  N/A   Occupational History  . Not on file.   Social History Main Topics  . Smoking status: Former Smoker    Types: Cigars    Quit date: 12/31/2014  . Smokeless tobacco: Never Used  . Alcohol Use: 0.0 oz/week    0 Standard drinks or equivalent per week     Comment: socailly  . Drug Use: No  . Sexual Activity: Yes    Birth Control/ Protection: None   Other Topics Concern  . Not on file   Social History Narrative     reports that he quit smoking about 5 months ago. His smoking use included Cigars. He has never used smokeless tobacco. He reports that he drinks alcohol. He reports that he does not use illicit drugs.  No Known Allergies  Medications: Patient's Medications  New Prescriptions   No medications on file  Previous Medications   ACETAMINOPHEN (TYLENOL) 500 MG TABLET     Take 1,000 mg by mouth 2 (two) times daily as needed for moderate pain.   ALBUTEROL (PROVENTIL) (2.5 MG/3ML) 0.083% NEBULIZER SOLUTION    USE ONE VIAL (3 ML) IN NEBULIZER EVERY 6 HOURS AS NEEDED FOR WHEEZING OR SHORTNESS OF BREATH   AMLODIPINE (NORVASC) 10 MG TABLET    Take 1 tablet (10 mg total) by mouth daily.   ASPIRIN EC 325 MG EC TABLET    Take 1 tablet (325 mg total) by mouth daily.   ATORVASTATIN (LIPITOR) 40 MG TABLET    Take 1 tablet (40 mg total) by mouth every evening.   BENAZEPRIL (LOTENSIN) 20 MG TABLET    Take 20 mg by mouth daily.    CARVEDILOL (COREG) 12.5 MG TABLET    Take 1 tablet (12.5 mg total) by mouth 2 (two) times daily with a meal.   CHOLECALCIFEROL ([employer] VITAMIN D3) 1000 UNITS CAPSULE    Take 2 capsules (2,000 Units total) by mouth daily.   CINNAMON PO    Take 1 tablet by mouth daily.   COENZYME Q10 (CO Q 10 PO)    Take 1 tablet by mouth daily.   FLUTICASONE-SALMETEROL (ADVAIR DISKUS) 100-50 MCG/DOSE AEPB    Inhale 1 puff into the lungs 2 (two) times daily.   FUROSEMIDE (LASIX) 40 MG TABLET    Take 1 tablet (40 mg total) by mouth daily.   HYDRALAZINE (APRESOLINE) 25 MG TABLET    Take 1 tablet (25 mg total) by mouth 3 (three) times daily.   INSULIN DETEMIR (LEVEMIR) 100 UNIT/ML INJECTION    Inject 0.5 mLs (50 Units total) into the skin 2 (two) times daily.   LINAGLIPTIN (TRADJENTA) 5 MG TABS TABLET    Take 5 mg by mouth daily.   MONTELUKAST (SINGULAIR) 10 MG TABLET    Take 10 mg by mouth at bedtime.   PROAIR HFA 108 (90 BASE) MCG/ACT INHALER    INHALE TWO PUFFS BY MOUTH EVERY 6 HOURS AS NEEDED FOR SHORTNESS OF BREATH   TERBINAFINE (LAMISIL) 1 % CREAM    Apply 1 application topically daily as needed (foot).    TRADJENTA 5 MG TABS TABLET    TAKE ONE TABLET BY MOUTH ONCE DAILY  Modified Medications   No medications on file  Discontinued Medications   No medications on file    Review of Systems  Constitutional: Negative for chills, activity change and fatigue.  HENT:  Negative for sore throat and trouble swallowing.   Eyes: Negative for visual disturbance.  Respiratory: Negative for cough, chest tightness and shortness  of breath.   Cardiovascular: Negative for chest pain, palpitations and leg swelling.  Gastrointestinal: Negative for nausea, vomiting, abdominal pain and blood in stool.  Genitourinary: Negative for urgency, frequency and difficulty urinating.  Musculoskeletal: Negative for arthralgias and gait problem.  Skin: Negative for rash.  Neurological: Negative for weakness and headaches.  Psychiatric/Behavioral: Negative for confusion and sleep disturbance. The patient is not nervous/anxious.     Filed Vitals:   06/03/15 1433  BP: 140/68  Pulse: 75  Temp: 98 F (36.7 C)  TempSrc: Oral  Resp: 20  Height: 6\' 1"  (1.854 m)  Weight: 238 lb 12.8 oz (108.319 kg)  SpO2: 96%   Body mass index is 31.51 kg/(m^2).  Physical Exam  Constitutional: He is oriented to person, place, and time. He appears well-developed and well-nourished.  HENT:  Mouth/Throat: Oropharynx is clear and moist.  Eyes: Pupils are equal, round, and reactive to light. No scleral icterus.  Neck: Neck supple. Carotid bruit is not present. No thyromegaly present.  Cardiovascular: Normal rate, regular rhythm and intact distal pulses.  Exam reveals no gallop and no friction rub.   Murmur (1/6 SEM) heard. +1 pitting LE edema b/l. No calf TTP  Pulmonary/Chest: Effort normal and breath sounds normal. He has no wheezes. He has no rales. He exhibits no tenderness.  Abdominal: Soft. Bowel sounds are normal. He exhibits no distension, no abdominal bruit, no pulsatile midline mass and no mass. There is no tenderness. There is no rebound and no guarding.  Musculoskeletal: He exhibits edema.  Lymphadenopathy:    He has no cervical adenopathy.  Neurological: He is alert and oriented to person, place, and time. He has normal reflexes.  Skin: Skin is warm and dry. No rash noted.    Psychiatric: He has a normal mood and affect. His behavior is normal. Judgment and thought content normal.   Diabetic Foot Exam - Simple   Simple Foot Form  Diabetic Foot exam was performed with the following findings:  Yes 06/03/2015  3:14 PM  Visual Inspection  No deformities, no ulcerations, no other skin breakdown bilaterally:  Yes  Sensation Testing  Intact to touch and monofilament testing bilaterally:  Yes  Pulse Check  Posterior Tibialis and Dorsalis pulse intact bilaterally:  Yes  Comments        Labs reviewed: Appointment on 06/01/2015  Component Date Value Ref Range Status  . Glucose 06/01/2015 59* 65 - 99 mg/dL Final  . BUN 06/01/2015 36* 8 - 27 mg/dL Final  . Creatinine, Ser 06/01/2015 1.70* 0.76 - 1.27 mg/dL Final  . GFR calc non Af Amer 06/01/2015 41* >59 mL/min/1.73 Final  . GFR calc Af Amer 06/01/2015 47* >59 mL/min/1.73 Final  . BUN/Creatinine Ratio 06/01/2015 21  10 - 24 Final  . Sodium 06/01/2015 142  134 - 144 mmol/L Final  . Potassium 06/01/2015 4.8  3.5 - 5.2 mmol/L Final  . Chloride 06/01/2015 101  96 - 106 mmol/L Final  . CO2 06/01/2015 25  18 - 29 mmol/L Final  . Calcium 06/01/2015 9.8  8.6 - 10.2 mg/dL Final  . ALT 06/01/2015 18  0 - 44 IU/L Final  . Hgb A1c MFr Bld 06/01/2015 8.1* 4.8 - 5.6 % Final   Comment:          Pre-diabetes: 5.7 - 6.4          Diabetes: >6.4          Glycemic control for adults with diabetes: <7.0   . Est. average  glucose Bld gHb Est-m* 06/01/2015 186   Final  . Cholesterol, Total 06/01/2015 149  100 - 199 mg/dL Final  . Triglycerides 06/01/2015 100  0 - 149 mg/dL Final  . HDL 06/01/2015 45  >39 mg/dL Final  . VLDL Cholesterol Cal 06/01/2015 20  5 - 40 mg/dL Final  . LDL Calculated 06/01/2015 84  0 - 99 mg/dL Final  . Chol/HDL Ratio 06/01/2015 3.3  0.0 - 5.0 ratio units Final   Comment:                                   T. Chol/HDL Ratio                                             Men  Women                                1/2 Avg.Risk  3.4    3.3                                   Avg.Risk  5.0    4.4                                2X Avg.Risk  9.6    7.1                                3X Avg.Risk 23.4   11.0     No results found.   Assessment/Plan   ICD-9-CM ICD-10-CM   1. Uncontrolled type 2 diabetes mellitus with diabetic nephropathy, without long-term current use of insulin (HCC) 250.42 Q000111Q Basic Metabolic Panel   123456 [id number] ALT     Hemoglobin A1c  2. Essential hypertension 401.9 I10   3. Asthma, chronic, mild intermittent, uncomplicated 123456 [id number]   4. Hyperlipidemia with target LDL less than 100 272.4 E78.5 Lipid Panel  5. CKD (chronic kidney disease) stage 3, GFR 30-59 ml/min 585.3 N18.3   6. CAD in native artery 414.01 I25.10    Continue current medications as ordered  Follow up with cardiac rehab as scheduled  Follow up with cardiology as scheduled  Follow up in 3 mos for routine visit. Fasting labs prior to appt   Ceex Haci S. Perlie Gold  [facility] [address] [place], [place] [address] [phone] Cell (Monday-Friday 8 AM - 5 PM) [phone] After 5 PM and follow prompts

## 2015-06-06 ENCOUNTER — Encounter (HOSPITAL_COMMUNITY)
Admission: RE | Admit: 2015-06-06 | Discharge: 2015-06-06 | Disposition: A | Discharge status: Home / Self Care | Payer: Medicare Other | Source: Ambulatory Visit | Attending: Cardiology | Admitting: Cardiology

## 2015-06-06 DIAGNOSIS — I2510 Atherosclerotic heart disease of native coronary artery without angina pectoris: Secondary | ICD-10-CM | POA: Diagnosis not present

## 2015-06-06 DIAGNOSIS — Z951 Presence of aortocoronary bypass graft: Secondary | ICD-10-CM | POA: Diagnosis not present

## 2015-06-08 ENCOUNTER — Encounter (HOSPITAL_COMMUNITY)
Admission: RE | Admit: 2015-06-08 | Discharge: 2015-06-08 | Disposition: A | Discharge status: Home / Self Care | Payer: Medicare Other | Source: Ambulatory Visit | Attending: Cardiology | Admitting: Cardiology

## 2015-06-08 DIAGNOSIS — I2510 Atherosclerotic heart disease of native coronary artery without angina pectoris: Secondary | ICD-10-CM | POA: Diagnosis not present

## 2015-06-08 DIAGNOSIS — Z951 Presence of aortocoronary bypass graft: Secondary | ICD-10-CM | POA: Diagnosis not present

## 2015-06-10 ENCOUNTER — Encounter (HOSPITAL_COMMUNITY)
Admission: RE | Admit: 2015-06-10 | Discharge: 2015-06-10 | Disposition: A | Discharge status: Home / Self Care | Payer: Medicare Other | Source: Ambulatory Visit | Attending: Cardiology | Admitting: Cardiology

## 2015-06-10 DIAGNOSIS — Z951 Presence of aortocoronary bypass graft: Secondary | ICD-10-CM | POA: Diagnosis not present

## 2015-06-10 DIAGNOSIS — I2510 Atherosclerotic heart disease of native coronary artery without angina pectoris: Secondary | ICD-10-CM | POA: Diagnosis not present

## 2015-06-13 ENCOUNTER — Encounter (HOSPITAL_COMMUNITY)
Admission: RE | Admit: 2015-06-13 | Discharge: 2015-06-13 | Disposition: A | Discharge status: Home / Self Care | Payer: Medicare Other | Source: Ambulatory Visit | Attending: Cardiology | Admitting: Cardiology

## 2015-06-13 DIAGNOSIS — N183 Chronic kidney disease, stage 3 (moderate): Secondary | ICD-10-CM | POA: Diagnosis not present

## 2015-06-13 DIAGNOSIS — I2510 Atherosclerotic heart disease of native coronary artery without angina pectoris: Secondary | ICD-10-CM | POA: Insufficient documentation

## 2015-06-13 DIAGNOSIS — Z951 Presence of aortocoronary bypass graft: Secondary | ICD-10-CM | POA: Diagnosis not present

## 2015-06-14 ENCOUNTER — Other Ambulatory Visit: Payer: Self-pay | Admitting: Internal Medicine

## 2015-06-14 DIAGNOSIS — H2513 Age-related nuclear cataract, bilateral: Secondary | ICD-10-CM | POA: Diagnosis not present

## 2015-06-14 DIAGNOSIS — E119 Type 2 diabetes mellitus without complications: Secondary | ICD-10-CM | POA: Diagnosis not present

## 2015-06-14 DIAGNOSIS — Z794 Long term (current) use of insulin: Secondary | ICD-10-CM | POA: Diagnosis not present

## 2015-06-15 ENCOUNTER — Encounter (HOSPITAL_COMMUNITY)
Admission: RE | Admit: 2015-06-15 | Discharge: 2015-06-15 | Disposition: A | Discharge status: Home / Self Care | Payer: Medicare Other | Source: Ambulatory Visit | Attending: Cardiology | Admitting: Cardiology

## 2015-06-15 DIAGNOSIS — Z951 Presence of aortocoronary bypass graft: Secondary | ICD-10-CM | POA: Diagnosis not present

## 2015-06-15 DIAGNOSIS — I2510 Atherosclerotic heart disease of native coronary artery without angina pectoris: Secondary | ICD-10-CM | POA: Diagnosis not present

## 2015-06-17 ENCOUNTER — Encounter (HOSPITAL_COMMUNITY): Payer: Medicare Other

## 2015-06-20 ENCOUNTER — Encounter (HOSPITAL_COMMUNITY)
Admission: RE | Admit: 2015-06-20 | Discharge: 2015-06-20 | Disposition: A | Discharge status: Home / Self Care | Payer: Medicare Other | Source: Ambulatory Visit | Attending: Cardiology | Admitting: Cardiology

## 2015-06-20 DIAGNOSIS — Z951 Presence of aortocoronary bypass graft: Secondary | ICD-10-CM | POA: Diagnosis not present

## 2015-06-20 DIAGNOSIS — I2510 Atherosclerotic heart disease of native coronary artery without angina pectoris: Secondary | ICD-10-CM | POA: Diagnosis not present

## 2015-06-22 ENCOUNTER — Encounter (HOSPITAL_COMMUNITY)
Admission: RE | Admit: 2015-06-22 | Discharge: 2015-06-22 | Disposition: A | Discharge status: Home / Self Care | Payer: Medicare Other | Source: Ambulatory Visit | Attending: Cardiology | Admitting: Cardiology

## 2015-06-22 DIAGNOSIS — I2510 Atherosclerotic heart disease of native coronary artery without angina pectoris: Secondary | ICD-10-CM | POA: Diagnosis not present

## 2015-06-22 DIAGNOSIS — Z951 Presence of aortocoronary bypass graft: Secondary | ICD-10-CM | POA: Diagnosis not present

## 2015-06-23 ENCOUNTER — Other Ambulatory Visit: Payer: Self-pay | Admitting: Internal Medicine

## 2015-06-24 ENCOUNTER — Encounter (HOSPITAL_COMMUNITY)
Admission: RE | Admit: 2015-06-24 | Discharge: 2015-06-24 | Disposition: A | Discharge status: Home / Self Care | Payer: Medicare Other | Source: Ambulatory Visit | Attending: Cardiology | Admitting: Cardiology

## 2015-06-24 DIAGNOSIS — Z951 Presence of aortocoronary bypass graft: Secondary | ICD-10-CM | POA: Diagnosis not present

## 2015-06-24 DIAGNOSIS — I2510 Atherosclerotic heart disease of native coronary artery without angina pectoris: Secondary | ICD-10-CM | POA: Diagnosis not present

## 2015-06-27 ENCOUNTER — Encounter (HOSPITAL_COMMUNITY)
Admission: RE | Admit: 2015-06-27 | Discharge: 2015-06-27 | Disposition: A | Discharge status: Home / Self Care | Payer: Medicare Other | Source: Ambulatory Visit | Attending: Cardiology | Admitting: Cardiology

## 2015-06-27 DIAGNOSIS — I2510 Atherosclerotic heart disease of native coronary artery without angina pectoris: Secondary | ICD-10-CM | POA: Diagnosis not present

## 2015-06-27 DIAGNOSIS — Z951 Presence of aortocoronary bypass graft: Secondary | ICD-10-CM | POA: Diagnosis not present

## 2015-06-29 ENCOUNTER — Encounter (HOSPITAL_COMMUNITY)
Admission: RE | Admit: 2015-06-29 | Discharge: 2015-06-29 | Disposition: A | Discharge status: Home / Self Care | Payer: Medicare Other | Source: Ambulatory Visit | Attending: Cardiology | Admitting: Cardiology

## 2015-06-29 DIAGNOSIS — I2510 Atherosclerotic heart disease of native coronary artery without angina pectoris: Secondary | ICD-10-CM | POA: Diagnosis not present

## 2015-06-29 DIAGNOSIS — Z951 Presence of aortocoronary bypass graft: Secondary | ICD-10-CM | POA: Diagnosis not present

## 2015-07-01 ENCOUNTER — Encounter (HOSPITAL_COMMUNITY): Payer: Medicare Other

## 2015-07-04 ENCOUNTER — Encounter (HOSPITAL_COMMUNITY)
Admission: RE | Admit: 2015-07-04 | Discharge: 2015-07-04 | Disposition: A | Discharge status: Home / Self Care | Payer: Medicare Other | Source: Ambulatory Visit | Attending: Cardiology | Admitting: Cardiology

## 2015-07-04 DIAGNOSIS — Z951 Presence of aortocoronary bypass graft: Secondary | ICD-10-CM | POA: Diagnosis not present

## 2015-07-04 DIAGNOSIS — I2510 Atherosclerotic heart disease of native coronary artery without angina pectoris: Secondary | ICD-10-CM | POA: Diagnosis not present

## 2015-07-06 ENCOUNTER — Encounter (HOSPITAL_COMMUNITY)
Admission: RE | Admit: 2015-07-06 | Discharge: 2015-07-06 | Disposition: A | Discharge status: Home / Self Care | Payer: Medicare Other | Source: Ambulatory Visit | Attending: Cardiology | Admitting: Cardiology

## 2015-07-06 DIAGNOSIS — I2510 Atherosclerotic heart disease of native coronary artery without angina pectoris: Secondary | ICD-10-CM | POA: Diagnosis not present

## 2015-07-06 DIAGNOSIS — N183 Chronic kidney disease, stage 3 (moderate): Secondary | ICD-10-CM | POA: Diagnosis not present

## 2015-07-06 DIAGNOSIS — Z951 Presence of aortocoronary bypass graft: Secondary | ICD-10-CM | POA: Diagnosis not present

## 2015-07-06 NOTE — Progress Notes (Signed)
Daily Session Note  Patient Details  Name: Henry Carroll MRN: [id number] Date of Birth: 1950/02/15 Referring Provider:    Encounter Date: 07/06/2015  Check In:     Session Check In - 07/06/15 1545    Check-In   Location [facility]   Staff Present Eliza Green Angelina Pih, MS, EP, [facility], Exercise Physiologist;Christy Edwards, RN, BSN;Other  Nils Flack, EP   Supervising physician immediately available to respond to emergencies See telemetry face sheet for immediately available MD   Warm-up and Cool-down Performed as group-led instruction   Resistance Training Performed Yes   VAD Patient? No   Pain Assessment   Currently in Pain? No/denies   Multiple Pain Sites No      Capillary Blood Glucose: No results found for this or any previous visit (from the past 24 hour(s)).   Goals Met:  Independence with exercise equipment Improved SOB with ADL's Achieving weight loss Exercise tolerated well No report of cardiac concerns or symptoms Strength training completed today  Goals Unmet:  RPE BP O2 Sat  Comments: Patient is progressing appropriately. Check out time: 4:45pm   Dr. Kate Sable is Medical Director for [place] and Pulmonary Rehab.

## 2015-07-08 ENCOUNTER — Encounter (HOSPITAL_COMMUNITY)
Admission: RE | Admit: 2015-07-08 | Discharge: 2015-07-08 | Disposition: A | Discharge status: Home / Self Care | Payer: Medicare Other | Source: Ambulatory Visit | Attending: Cardiology | Admitting: Cardiology

## 2015-07-08 DIAGNOSIS — I2510 Atherosclerotic heart disease of native coronary artery without angina pectoris: Secondary | ICD-10-CM | POA: Diagnosis not present

## 2015-07-08 DIAGNOSIS — Z951 Presence of aortocoronary bypass graft: Secondary | ICD-10-CM

## 2015-07-08 NOTE — Progress Notes (Signed)
Daily Session Note  Patient Details  Name: Henry Carroll MRN: [id number] Date of Birth: [birth year] Referring Provider:    Encounter Date: 07/08/2015  Check In:     Session Check In - 07/08/15 1545    Check-In   Location [facility]   Staff Present Phuong Hillary Angelina Pih, MS, EP, [facility], Exercise Physiologist;Christy Edwards, RN, BSN;Other  Nils Flack, EP   Supervising physician immediately available to respond to emergencies See telemetry face sheet for immediately available MD   Medication changes reported     No   Warm-up and Cool-down Performed as group-led instruction   Resistance Training Performed Yes   VAD Patient? No   Pain Assessment   Currently in Pain? No/denies   Multiple Pain Sites No      Capillary Blood Glucose: No results found for this or any previous visit (from the past 24 hour(s)).   Goals Met:  Independence with exercise equipment Achieving weight loss Changing diet to healthy choices, watching portion sizes Exercise tolerated well No report of cardiac concerns or symptoms Strength training completed today  Goals Unmet:  RPE BP HR  Comments: Patient is progressing appropriately. Check out: 1645   Dr. Kate Sable is Medical Director for [place] and Pulmonary Rehab.

## 2015-07-10 NOTE — Progress Notes (Signed)
Cardiac Individual Treatment Plan  Patient Details  Name: Henry Carroll MRN: [id number] Date of Birth: April 05, 1950 Referring Provider:    Initial Encounter Date:       CARDIAC REHAB PHASE II ORIENTATION from 04/26/2015 in [place]   Date  04/26/15      Visit Diagnosis: S/P CABG x 4  Patient's Home Medications on Admission:  Current outpatient prescriptions:  .  acetaminophen (TYLENOL) 500 MG tablet, Take 1,000 mg by mouth 2 (two) times daily as needed for moderate pain., Disp: , Rfl:  .  ADVAIR DISKUS 100-50 MCG/DOSE AEPB, INHALE ONE DOSE BY MOUTH TWICE DAILY, Disp: 60 each, Rfl: 0 .  albuterol (PROVENTIL) (2.5 MG/3ML) 0.083% nebulizer solution, USE ONE VIAL (3 ML) IN NEBULIZER EVERY 6 HOURS AS NEEDED FOR WHEEZING OR SHORTNESS OF BREATH, Disp: 300 mL, Rfl: 11 .  amLODipine (NORVASC) 10 MG tablet, Take 1 tablet (10 mg total) by mouth daily., Disp: 30 tablet, Rfl: 6 .  aspirin EC 325 MG EC tablet, Take 1 tablet (325 mg total) by mouth daily., Disp: , Rfl:  .  atorvastatin (LIPITOR) 40 MG tablet, Take 1 tablet (40 mg total) by mouth every evening., Disp: 30 tablet, Rfl: 6 .  benazepril (LOTENSIN) 20 MG tablet, Take 20 mg by mouth daily. , Disp: , Rfl:  .  carvedilol (COREG) 12.5 MG tablet, Take 1 tablet (12.5 mg total) by mouth 2 (two) times daily with a meal., Disp: 60 tablet, Rfl: 6 .  Cholecalciferol ([employer] VITAMIN D3) 1000 UNITS capsule, Take 2 capsules (2,000 Units total) by mouth daily., Disp: 60 capsule, Rfl: 3 .  CINNAMON PO, Take 1 tablet by mouth daily., Disp: , Rfl:  .  Coenzyme Q10 (CO Q 10 PO), Take 1 tablet by mouth daily., Disp: , Rfl:  .  Fluticasone-Salmeterol (ADVAIR DISKUS) 100-50 MCG/DOSE AEPB, Inhale 1 puff into the lungs 2 (two) times daily., Disp: , Rfl:  .  furosemide (LASIX) 40 MG tablet, Take 1 tablet (40 mg total) by mouth daily. (Patient taking differently: Take 40 mg by mouth 2 (two) times daily. ), Disp: 30 tablet, Rfl: 0 .   hydrALAZINE (APRESOLINE) 25 MG tablet, Take 1 tablet (25 mg total) by mouth 3 (three) times daily., Disp: 180 tablet, Rfl: 5 .  insulin detemir (LEVEMIR) 100 UNIT/ML injection, Inject 0.5 mLs (50 Units total) into the skin 2 (two) times daily. (Patient taking differently: Inject 45 Units into the skin at bedtime. ), Disp: 30 mL, Rfl: 11 .  linagliptin (TRADJENTA) 5 MG TABS tablet, Take 5 mg by mouth daily. Reported on 06/03/2015, Disp: , Rfl:  .  montelukast (SINGULAIR) 10 MG tablet, Take 10 mg by mouth at bedtime., Disp: , Rfl:  .  ONE TOUCH ULTRA TEST test strip, USE ONE STRIP TO CHECK GLUCOSE THREE TIMES DAILY, Disp: 100 each, Rfl: 11 .  PROAIR HFA 108 (90 BASE) MCG/ACT inhaler, INHALE TWO PUFFS BY MOUTH EVERY 6 HOURS AS NEEDED FOR SHORTNESS OF BREATH, Disp: 27 each, Rfl: 3 .  terbinafine (LAMISIL) 1 % cream, Apply 1 application topically daily as needed (foot). , Disp: , Rfl:  .  TRADJENTA 5 MG TABS tablet, TAKE ONE TABLET BY MOUTH ONCE DAILY, Disp: 30 tablet, Rfl: 3  Past Medical History: Past Medical History  Diagnosis Date  . Diabetes mellitus   . Asthma   . Hypertension   . Anemia   . Anxiety   . Hyperlipidemia   . CKD (chronic kidney disease), stage  III   . Chronic combined systolic (congestive) and diastolic (congestive) heart failure ([place])     a. 12/31/14: 2D ECHO: EF 40-45%, HK of inf myocardium, G1DD, mod MR  . Obesity     a. BMI 33  . Coronary artery disease     a. LHC 01/2015 - triple vessel CAD (mod oLM, mLAD, severe mRCA, intermediate branch stenosis, CTO of mCx). Plan CABG 02/2015.  Marland Kitchen NSVT (nonsustained ventricular tachycardia) (H[facility])     a. 9 beats during 01/2015 adm. BB titrated.  . Mitral regurgitation     a. Mild-mod by echo 12/2014.  Marland Kitchen Myocardial infarction [facility])     pt. states per [name] he has in the past.  . 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 Former Smoker  . Types: Cigars  . Quit date: 12/31/2014  Smokeless  tobacco  . Never Used    Labs:     Recent Review Flowsheet Data    Labs for ITP Cardiac and Pulmonary Rehab Latest Ref Rng 02/18/2015 02/18/2015 02/18/2015 02/19/2015 06/01/2015   Cholestrol 100 - 199 mg/dL - - - - 149   LDLCALC 0 - 99 mg/dL - - - - 84   HDL >39 mg/dL - - - - 45   Trlycerides 0 - 149 mg/dL - - - - 100   Hemoglobin A1c 4.8 - 5.6 % - - - - 8.1(H)   PHART 7.350 - 7.450 7.320(L) 7.315(L) - - -   PCO2ART 35.0 - 45.0 mmHg 43.1 44.4 - - -   HCO3 20.0 - 24.0 mEq/L 22.2 22.7 - - -   TCO2 0 - 100 mmol/L 23 24 23 21  -   ACIDBASEDEF 0.0 - 2.0 mmol/L 4.0(H) 3.0(H) - - -   O2SAT - 97.0 96.0 - - -      Capillary Blood Glucose: Lab Results  Component Value Date   GLUCAP 202* 02/22/2015   GLUCAP 53* 02/22/2015   GLUCAP 40* 02/22/2015   GLUCAP 181* 02/21/2015   GLUCAP 139* 02/21/2015     Exercise Target Goals:    Exercise Program Goal: Individual exercise prescription set with THRR, safety & activity barriers. Participant demonstrates ability to understand and report RPE using BORG scale, to self-measure pulse accurately, and to acknowledge the importance of the exercise prescription.  Exercise Prescription Goal: Starting with aerobic activity 30 plus minutes a day, 3 days per week for initial exercise prescription. Provide home exercise prescription and guidelines that participant acknowledges understanding prior to discharge.  Activity Barriers & Risk Stratification:     Activity Barriers & Cardiac Risk Stratification - 04/26/15 1701    Activity Barriers & Cardiac Risk Stratification   Activity Barriers None   Cardiac Risk Stratification High      6 Minute Walk:     6 Minute Walk      04/26/15 1549       6 Minute Walk   Phase Initial     Distance 1400 feet     Walk Time 6 minutes     # of Rest Breaks 0     MPH 2.65     METS 3.03     RPE 9     Perceived Dyspnea  9     VO2 Peak 12.21     Symptoms No     Resting HR 95 bpm     Resting BP 158/92 mmHg      Max Ex.  HR 114 bpm     Max Ex. BP 158/78 mmHg     2 Minute Post BP 128/80 mmHg     Pre/Post BP   Baseline BP 158/92 mmHg     6 Minute BP 158/78 mmHg     Pre/Post BP? Yes        Initial Exercise Prescription:     Initial Exercise Prescription - 04/26/15 1600    Date of Initial Exercise RX and Referring Provider   Date 04/26/15   Treadmill   MPH 2   Grade 0   Minutes 15   METs 2.53   NuStep   Level 2   Minutes 15   METs 1.5   Prescription Details   Frequency (times per week) 3   Intensity   THRR REST +  30   THRR 40-80% of Max Heartrate 118-130   Ratings of Perceived Exertion 11-13   Progression   Progression Continue to progress workloads to maintain intensity without signs/symptoms of physical distress.   Resistance Training   Training Prescription Yes   Weight 1.0   Reps 10-12      Perform Capillary Blood Glucose checks as needed.  Exercise Prescription Changes:      Exercise Prescription Changes      05/09/15 1300 07/10/15 1400         Exercise Review   Progression Yes Yes      Response to Exercise   Blood Pressure (Admit) 140/78 mmHg 148/76 mmHg      Blood Pressure (Exercise) 140/80 mmHg 154/70 mmHg      Blood Pressure (Exit) 138/78 mmHg 112/60 mmHg      Heart Rate (Admit) 59 bpm 76 bpm      Heart Rate (Exercise) 96 bpm 95 bpm      Heart Rate (Exit) 68 bpm 69 bpm      Rating of Perceived Exertion (Exercise) 10 10      Symptoms No No      Progression   Progression Continue to progress workloads to maintain intensity without signs/symptoms of physical distress. Continue to progress workloads to maintain intensity without signs/symptoms of physical distress.      Resistance Training   Training Prescription Yes Yes      Weight 2.0 5      Reps 10-12 10-12      Interval Training   Interval Training No No      Treadmill   MPH 2 3.2      Grade 0 2      Minutes 15 15      METs 2.53 4.32      NuStep   Level 2 3      Minutes 15 20      METs 2.2  3.68      Home Exercise Plan   Plans to continue exercise at  Home      Frequency  Add 2 additional days to program exercise sessions.         Exercise Comments:      Exercise Comments      07/10/15 1431           Exercise Comments Patient is progressing appropriately. He has lost 6.9lbs thus far in the program.           Discharge Exercise Prescription (Final Exercise Prescription Changes):     Exercise Prescription Changes - 07/10/15 1400    Exercise Review   Progression Yes   Response to Exercise   Blood Pressure (Admit)  148/76 mmHg   Blood Pressure (Exercise) 154/70 mmHg   Blood Pressure (Exit) 112/60 mmHg   Heart Rate (Admit) 76 bpm   Heart Rate (Exercise) 95 bpm   Heart Rate (Exit) 69 bpm   Rating of Perceived Exertion (Exercise) 10   Symptoms No   Progression   Progression Continue to progress workloads to maintain intensity without signs/symptoms of physical distress.   Resistance Training   Training Prescription Yes   Weight 5   Reps 10-12   Interval Training   Interval Training No   Treadmill   MPH 3.2   Grade 2   Minutes 15   METs 4.32   NuStep   Level 3   Minutes 20   METs 3.68   Home Exercise Plan   Plans to continue exercise at Home   Frequency Add 2 additional days to program exercise sessions.      Nutrition:  Target Goals: Understanding of nutrition guidelines, daily intake of sodium 1500mg , cholesterol 200mg , calories 30% from fat and 7% or less from saturated fats, daily to have 5 or more servings of fruits and vegetables.  Biometrics:     Pre Biometrics - 04/26/15 1616    Pre Biometrics   Height 6\' 1"  (1.854 m)   Weight 231 lb 11.2 oz (105.098 kg)   Waist Circumference 42.5 inches   Hip Circumference 41 inches   Waist to Hip Ratio 1.04 %   BMI (Calculated) 30.6   Triceps Skinfold 5 mm   % Body Fat 26.1 %   Grip Strength 88 kg   Flexibility 0 in   Single Leg Stand 40 seconds       Nutrition Therapy Plan and  Nutrition Goals:     Nutrition Therapy & Goals - 04/26/15 1645    Nutrition Therapy   Diet Heart healthy, diabetic healthy, low sodium   Intervention Plan   Intervention Prescribe, educate and counsel regarding individualized specific dietary modifications aiming towards targeted core components such as weight, hypertension, lipid management, diabetes, heart failure and other comorbidities.   Expected Outcomes Long Term Goal: Adherence to prescribed nutrition plan.      Nutrition Discharge: Rate Your Plate Scores:     Nutrition Assessments - 04/26/15 1708    MEDFICTS Scores   Pre Score 60      Nutrition Goals Re-Evaluation:     Nutrition Goals Re-Evaluation      05/09/15 1516           Intervention Plan   Intervention Continue to educate, counsel and set short/long term goals regarding individualized specific personal dietary modifications.          Psychosocial: Target Goals: Acknowledge presence or absence of depression, maximize coping skills, provide positive support system. Participant is able to verbalize types and ability to use techniques and skills needed for reducing stress and depression.  Initial Review & Psychosocial Screening:     Initial Psych Review & Screening - 04/26/15 1655    Initial Review   Current issues with --  None   Family Dynamics   Good Support System? Yes   Concerns --  None   Barriers   Psychosocial barriers to participate in program There are no identifiable barriers or psychosocial needs.   Screening Interventions   Interventions --  Will follow guidelines to regain heart health      Quality of Life Scores:     Quality of Life - 04/26/15 1617    Quality of Life Scores  Health/Function Pre 25.29 %   Socioeconomic Pre 28.21 %   Psych/Spiritual Pre 26.75 %   Family Pre 28.5 %   GLOBAL Pre 26.65 %      PHQ-9:     Recent Review Flowsheet Data    Depression screen [facility] 2/9 06/03/2015 04/26/2015 12/29/2014 12/01/2014  12/22/2013   Decreased Interest 0 0 0 0 0   Down, Depressed, Hopeless 0 0 0 0 0   PHQ - 2 Score 0 0 0 0 0   Altered sleeping - 0 - - -   Tired, decreased energy - 0 - - -   Change in appetite - 0 - - -   Feeling bad or failure about yourself  - 0 - - -   Trouble concentrating - 0 - - -   Moving slowly or fidgety/restless - 0 - - -   Suicidal thoughts - 0 - - -   PHQ-9 Score - 0 - - -   Difficult doing work/chores - Not difficult at all - - -      Psychosocial Evaluation and Intervention:   Psychosocial Re-Evaluation:     Psychosocial Re-Evaluation      05/09/15 1517 06/10/15 1352 07/04/15 1517       Psychosocial Re-Evaluation   Interventions Encouraged to attend Cardiac Rehabilitation for the exercise Encouraged to attend Cardiac Rehabilitation for the exercise Encouraged to attend Cardiac Rehabilitation for the exercise     Continued Psychosocial Services Needed No No No        Vocational Rehabilitation: Provide vocational rehab assistance to qualifying candidates.   Vocational Rehab Evaluation & Intervention:     Vocational Rehab - 04/26/15 1703    Initial Vocational Rehab Evaluation & Intervention   Assessment shows need for Vocational Rehabilitation No      Education: Education Goals: Education classes will be provided on a weekly basis, covering required topics. Participant will state understanding/return demonstration of topics presented.  Learning Barriers/Preferences:     Learning Barriers/Preferences - 04/26/15 1701    Learning Barriers/Preferences   Learning Barriers None   Learning Preferences Written Material      Education Topics: Hypertension, Hypertension Reduction -Define heart disease and high blood pressure. Discus how high blood pressure affects the body and ways to reduce high blood pressure.      CARDIAC REHAB PHASE II EXERCISE from 07/06/2015 in [place]   Date  06/15/15   Educator  Russella Dar   Instruction  Review Code  2- meets goals/outcomes      Exercise and Your Heart -Discuss why it is important to exercise, the FITT principles of exercise, normal and abnormal responses to exercise, and how to exercise safely.      CARDIAC REHAB PHASE II EXERCISE from 07/06/2015 in [place]   Date  06/22/15   Educator  DC   Instruction Review Code  2- meets goals/outcomes      Angina -Discuss definition of angina, causes of angina, treatment of angina, and how to decrease risk of having angina.   Cardiac Medications -Review what the following cardiac medications are used for, how they affect the body, and side effects that may occur when taking the medications.  Medications include Aspirin, Beta blockers, calcium channel blockers, ACE Inhibitors, angiotensin receptor blockers, diuretics, digoxin, and antihyperlipidemics.      CARDIAC REHAB PHASE II EXERCISE from 07/06/2015 in [place]   Date  07/06/15   Educator  D. Hayk Divis   Instruction  Review Code  2- meets goals/outcomes      Congestive Heart Failure -Discuss the definition of CHF, how to live with CHF, the signs and symptoms of CHF, and how keep track of weight and sodium intake.   Heart Disease and Intimacy -Discus the effect sexual activity has on the heart, how changes occur during intimacy as we age, and safety during sexual activity.   Smoking Cessation / COPD -Discuss different methods to quit smoking, the health benefits of quitting smoking, and the definition of COPD.   Nutrition I: Fats -Discuss the types of cholesterol, what cholesterol does to the heart, and how cholesterol levels can be controlled.      CARDIAC REHAB PHASE II EXERCISE from 07/06/2015 in [place]   Date  05/04/15   Educator  Russella Dar   Instruction Review Code  2- meets goals/outcomes      Nutrition II: Labels -Discuss the different components of food labels and how to read food  label      [place] from 07/06/2015 in [place]   Date  05/11/15   Educator  Russella Dar   Instruction Review Code  2- meets goals/outcomes      Heart Parts and Heart Disease -Discuss the anatomy of the heart, the pathway of blood circulation through the heart, and these are affected by heart disease.      CARDIAC REHAB PHASE II EXERCISE from 07/06/2015 in [place]   Date  05/18/15   Educator  Russella Dar   Instruction Review Code  2- meets goals/outcomes      Stress I: Signs and Symptoms -Discuss the causes of stress, how stress may lead to anxiety and depression, and ways to limit stress.      CARDIAC REHAB PHASE II EXERCISE from 07/06/2015 in [place]   Date  05/25/15   Educator  D Shamonique Battiste   Instruction Review Code  2- meets goals/outcomes      Stress II: Relaxation -Discuss different types of relaxation techniques to limit stress.      CARDIAC REHAB PHASE II EXERCISE from 07/06/2015 in [place]   Date  06/01/15   Educator  D Zedric Deroy   Instruction Review Code  2- meets goals/outcomes      Warning Signs of Stroke / TIA -Discuss definition of a stroke, what the signs and symptoms are of a stroke, and how to identify when someone is having stroke.          CARDIAC REHAB PHASE II EXERCISE from 07/06/2015 in [place]   Date  06/08/15   Educator  D Quinisha Mould   Instruction Review Code  2- meets goals/outcomes      Knowledge Questionnaire Score:     Knowledge Questionnaire Score - 04/26/15 1702    Knowledge Questionnaire Score   Pre Score 21/24      Core Components/Risk Factors/Patient Goals at Admission:     Personal Goals and Risk Factors at Admission - 04/26/15 1646    Core Components/Risk Factors/Patient Goals on Admission    Weight Management Yes   Intervention Weight Management/Obesity: Establish reasonable short term and long  term weight goals.;Weight Management: Provide education and appropriate resources to help participant work on and attain dietary goals.   Admit Weight 231 lb 11.2 oz (105.098 kg)   Goal Weight: Short Term 200 lb (90.719 kg)   Goal Weight: Long Term 190 lb (86.183 kg)  Sedentary --  Walking daily   Tobacco Cessation --  None   Improve shortness of breath with ADL's --  N/A   Develop more efficient breathing techniques such as purse lipped breathing and diaphragmatic breathing; and practicing self-pacing with activity --  Not at this time   Increase knowledge of respiratory medications and ability to use respiratory devices properly  --  N/A   Diabetes --  Patient is already managing his diabetes.    Hypertension Yes   Intervention Monitor prescription use compliance.   Expected Outcomes Long Term: Maintenance of blood pressure at goal levels.   Lipids Yes   Intervention Provide education and support for participant on nutrition & aerobic/resistive exercise along with prescribed medications to achieve LDL 70mg , HDL >40mg .   Expected Outcomes Short Term: Participant states understanding of desired cholesterol values and is compliant with medications prescribed. Participant is following exercise prescription and nutrition guidelines.;Long Term: Cholesterol controlled with medications as prescribed, with individualized exercise RX and with personalized nutrition plan. Value goals: LDL < 70mg , HDL > 40 mg.   Stress --  N/A   Personal Goal Other Yes   Personal Goal Increase Stamina, Get back to normal, play with grandchildren and play golf   Intervention Work towards this personal goals incrementally throughout 12 weeks   Expected Outcomes Get back to golf course by June of program in May, lose 10-15lbs by May      Core Components/Risk Factors/Patient Goals Review:      Goals and Risk Factor Review      05/09/15 1516 06/10/15 1351 07/04/15 1515       Core Components/Risk  Factors/Patient Goals Review   Personal Goals Review Weight Management/Obesity;Hypertension;Lipids;Other Weight Management/Obesity;Hypertension;Lipids;Other Weight Management/Obesity;Hypertension;Lipids;Other     Review Will continue to monitor and educate patient Will continue to monitor and educate patient. Patient has lost 10 lbs since started program.  Will continue to monitor and educate patient. Patient continuing to lose weight. Patient is playing golf again.      Expected Outcomes  wieght loss, increased energy. play golf wieght loss, increased energy. play golf        Core Components/Risk Factors/Patient Goals at Discharge (Final Review):      Goals and Risk Factor Review - 07/04/15 1515    Core Components/Risk Factors/Patient Goals Review   Personal Goals Review Weight Management/Obesity;Hypertension;Lipids;Other   Review Will continue to monitor and educate patient. Patient continuing to lose weight. Patient is playing golf again.    Expected Outcomes wieght loss, increased energy. play golf      ITP Comments:   Comments: Patient is doing well in the program. We will continue to monitor for progress.

## 2015-07-11 ENCOUNTER — Encounter (HOSPITAL_COMMUNITY): Payer: Medicare Other

## 2015-07-13 ENCOUNTER — Encounter (HOSPITAL_COMMUNITY)
Admission: RE | Admit: 2015-07-13 | Discharge: 2015-07-13 | Disposition: A | Discharge status: Home / Self Care | Payer: Medicare Other | Source: Ambulatory Visit | Attending: Cardiology | Admitting: Cardiology

## 2015-07-13 DIAGNOSIS — I2510 Atherosclerotic heart disease of native coronary artery without angina pectoris: Secondary | ICD-10-CM | POA: Diagnosis not present

## 2015-07-13 DIAGNOSIS — Z951 Presence of aortocoronary bypass graft: Secondary | ICD-10-CM | POA: Diagnosis not present

## 2015-07-13 NOTE — Progress Notes (Signed)
Daily Session Note  Patient Details  Name: Henry Carroll MRN: [id number] Date of Birth: 06-06-1950 Referring Provider:    Encounter Date: 07/13/2015  Check In:     Session Check In - 07/13/15 1617    Check-In   Location [facility]   Staff Present Diane Angelina Pih, MS, EP, [facility], Exercise Physiologist;Jodeci Rini Oletta Lamas, RN, BSN;Other   Supervising physician immediately available to respond to emergencies See telemetry face sheet for immediately available MD   Medication changes reported     No   Fall or balance concerns reported    No   Warm-up and Cool-down Performed as group-led instruction   Resistance Training Performed Yes   VAD Patient? No   Pain Assessment   Currently in Pain? No/denies      Capillary Blood Glucose: No results found for this or any previous visit (from the past 24 hour(s)).   Goals Met:  Proper associated with RPD/PD & O2 Sat Independence with exercise equipment Exercise tolerated well No report of cardiac concerns or symptoms Strength training completed today  Goals Unmet:  Not Applicable  Comments: check out [phone]   Dr. Kate Sable is Medical Director for [place] and Pulmonary Rehab.

## 2015-07-15 ENCOUNTER — Encounter (HOSPITAL_COMMUNITY): Payer: Medicare Other

## 2015-07-18 ENCOUNTER — Encounter (HOSPITAL_COMMUNITY)
Admission: RE | Admit: 2015-07-18 | Discharge: 2015-07-18 | Disposition: A | Discharge status: Home / Self Care | Payer: Medicare Other | Source: Ambulatory Visit | Attending: Cardiology | Admitting: Cardiology

## 2015-07-18 DIAGNOSIS — Z951 Presence of aortocoronary bypass graft: Secondary | ICD-10-CM | POA: Diagnosis not present

## 2015-07-18 DIAGNOSIS — I2510 Atherosclerotic heart disease of native coronary artery without angina pectoris: Secondary | ICD-10-CM | POA: Diagnosis not present

## 2015-07-18 NOTE — Progress Notes (Signed)
Daily Session Note  Patient Details  Name: HUSSAIN MAIMONE MRN: [id number] Date of Birth: [birth year] Referring Provider:    Encounter Date: 07/18/2015  Check In:     Session Check In - 07/18/15 1545    Check-In   Location [facility]   Staff Present Shilo Philipson Angelina Pih, MS, EP, [facility], Exercise Physiologist;Christy Oletta Lamas, RN, BSN;Other  Nils Flack, EP   Supervising physician immediately available to respond to emergencies See telemetry face sheet for immediately available MD   Medication changes reported     No   Fall or balance concerns reported    No   Warm-up and Cool-down Performed as group-led instruction   Resistance Training Performed Yes   VAD Patient? No   Pain Assessment   Currently in Pain? No/denies   Pain Score 0-No pain   Multiple Pain Sites No      Capillary Blood Glucose: No results found for this or any previous visit (from the past 24 hour(s)).   Goals Met:  Independence with exercise equipment Exercise tolerated well No report of cardiac concerns or symptoms Strength training completed today  Goals Unmet:  Not Applicable  Comments: Check out 1645   Dr. Kate Sable is Medical Director for [place] and Pulmonary Rehab.

## 2015-07-20 ENCOUNTER — Encounter (HOSPITAL_COMMUNITY)
Admission: RE | Admit: 2015-07-20 | Discharge: 2015-07-20 | Disposition: A | Discharge status: Home / Self Care | Payer: Medicare Other | Source: Ambulatory Visit | Attending: Cardiology | Admitting: Cardiology

## 2015-07-20 ENCOUNTER — Other Ambulatory Visit: Payer: Self-pay | Admitting: Internal Medicine

## 2015-07-20 DIAGNOSIS — Z951 Presence of aortocoronary bypass graft: Secondary | ICD-10-CM | POA: Diagnosis not present

## 2015-07-20 DIAGNOSIS — I2510 Atherosclerotic heart disease of native coronary artery without angina pectoris: Secondary | ICD-10-CM | POA: Diagnosis not present

## 2015-07-20 NOTE — Progress Notes (Signed)
Daily Session Note  Patient Details  Name: Henry Carroll MRN: [id number] Date of Birth: 06/21/1950 Referring Provider:    Encounter Date: 07/20/2015  Check In:     Session Check In - 07/20/15 1545    Check-In   Location [facility]   Staff Present Diane Coad, MS, EP, [facility], Exercise Physiologist;Christy Edwards, RN, BSN;Other  Greg Cowan, EP   Supervising physician immediately available to respond to emergencies See telemetry face sheet for immediately available MD   Medication changes reported     No   Fall or balance concerns reported    No   Warm-up and Cool-down Performed as group-led instruction   Resistance Training Performed Yes   VAD Patient? No   Pain Assessment   Currently in Pain? No/denies   Pain Score 0-No pain   Multiple Pain Sites No      Capillary Blood Glucose: No results found for this or any previous visit (from the past 24 hour(s)).      Exercise Prescription Changes - 07/20/15 1400    Exercise Review   Progression Yes   Response to Exercise   Blood Pressure (Admit) 140/70 mmHg   Blood Pressure (Exercise) 132/60 mmHg   Blood Pressure (Exit) 122/62 mmHg   Heart Rate (Admit) 82 bpm   Heart Rate (Exercise) 88 bpm   Heart Rate (Exit) 85 bpm   Rating of Perceived Exertion (Exercise) 10   Symptoms No   Progression   Progression Continue to progress workloads to maintain intensity without signs/symptoms of physical distress.   Resistance Training   Training Prescription Yes   Weight 5   Reps 10-12   Interval Training   Interval Training No   Treadmill   MPH 3.3   Grade 2   Minutes 15   METs 4.32   NuStep   Level 3   Minutes 20   METs 3.68   Home Exercise Plan   Plans to continue exercise at Home   Frequency Add 2 additional days to program exercise sessions.     Goals Met:  Independence with exercise equipment Exercise tolerated well No report of cardiac concerns or symptoms Strength training completed  today  Goals Unmet:  Not Applicable  Comments: Check out: 1645   Dr. Suresh Koneswaran is Medical Director for [facility]. 

## 2015-07-22 ENCOUNTER — Encounter (HOSPITAL_COMMUNITY)
Admission: RE | Admit: 2015-07-22 | Discharge: 2015-07-22 | Disposition: A | Discharge status: Home / Self Care | Payer: Medicare Other | Source: Ambulatory Visit | Attending: Cardiology | Admitting: Cardiology

## 2015-07-22 DIAGNOSIS — I2510 Atherosclerotic heart disease of native coronary artery without angina pectoris: Secondary | ICD-10-CM | POA: Diagnosis not present

## 2015-07-22 DIAGNOSIS — Z951 Presence of aortocoronary bypass graft: Secondary | ICD-10-CM | POA: Diagnosis not present

## 2015-07-22 NOTE — Progress Notes (Signed)
Daily Session Note  Patient Details  Name: Henry Carroll MRN: [id number] Date of Birth: [birth year] Referring Provider:    Encounter Date: 07/22/2015  Check In:     Session Check In - 07/22/15 1553    Check-In   Location [facility]   Staff Present Diane Angelina Pih, MS, EP, [facility], Exercise Physiologist;Dyan Labarbera Luther Parody, BS, EP, Exercise Physiologist;Christy Oletta Lamas, RN, BSN   Supervising physician immediately available to respond to emergencies See telemetry face sheet for immediately available MD   Medication changes reported     No   Fall or balance concerns reported    No   Warm-up and Cool-down Performed as group-led instruction   Resistance Training Performed Yes   VAD Patient? No   Pain Assessment   Currently in Pain? No/denies   Pain Score 0-No pain   Multiple Pain Sites No      Capillary Blood Glucose: No results found for this or any previous visit (from the past 24 hour(s)).   Goals Met:  Independence with exercise equipment Exercise tolerated well No report of cardiac concerns or symptoms Strength training completed today  Goals Unmet:  Not Applicable  Comments: Check out 445   Dr. Kate Sable is Medical Director for [place] and [facility].

## 2015-07-25 ENCOUNTER — Encounter (HOSPITAL_COMMUNITY)
Admission: RE | Admit: 2015-07-25 | Discharge: 2015-07-25 | Disposition: A | Discharge status: Home / Self Care | Payer: Medicare Other | Source: Ambulatory Visit | Attending: Cardiology | Admitting: Cardiology

## 2015-07-25 DIAGNOSIS — Z951 Presence of aortocoronary bypass graft: Secondary | ICD-10-CM | POA: Diagnosis not present

## 2015-07-25 DIAGNOSIS — I2510 Atherosclerotic heart disease of native coronary artery without angina pectoris: Secondary | ICD-10-CM | POA: Diagnosis not present

## 2015-07-27 ENCOUNTER — Encounter (HOSPITAL_COMMUNITY)
Admission: RE | Admit: 2015-07-27 | Discharge: 2015-07-27 | Disposition: A | Discharge status: Home / Self Care | Payer: Medicare Other | Source: Ambulatory Visit | Attending: Cardiology | Admitting: Cardiology

## 2015-07-27 DIAGNOSIS — I2510 Atherosclerotic heart disease of native coronary artery without angina pectoris: Secondary | ICD-10-CM | POA: Diagnosis not present

## 2015-07-27 DIAGNOSIS — Z951 Presence of aortocoronary bypass graft: Secondary | ICD-10-CM | POA: Diagnosis not present

## 2015-07-27 NOTE — Progress Notes (Signed)
Daily Session Note  Patient Details  Name: Henry Carroll MRN: [id number] Date of Birth: 1950-05-19 Referring Provider:    Encounter Date: 07/27/2015  Check In:     Session Check In - 07/27/15 1545    Check-In   Location [facility]   Staff Present Diane Angelina Pih, MS, EP, [facility], Exercise Physiologist;Orbin Mayeux Luther Parody, BS, EP, Exercise Physiologist;Christy Oletta Lamas, RN, BSN   Supervising physician immediately available to respond to emergencies See telemetry face sheet for immediately available MD   Medication changes reported     No   Fall or balance concerns reported    No   Warm-up and Cool-down Performed as group-led instruction   Resistance Training Performed Yes   VAD Patient? No   Pain Assessment   Currently in Pain? No/denies   Pain Score 0-No pain   Multiple Pain Sites No      Capillary Blood Glucose: No results found for this or any previous visit (from the past 24 hour(s)).   Goals Met:  Independence with exercise equipment Exercise tolerated well No report of cardiac concerns or symptoms Strength training completed today  Goals Unmet:  Not Applicable  Comments: [phone]   Dr. Kate Sable is Medical Director for [place] and [facility] Re[facility].

## 2015-07-29 ENCOUNTER — Encounter (HOSPITAL_COMMUNITY)
Admission: RE | Admit: 2015-07-29 | Discharge: 2015-07-29 | Disposition: A | Discharge status: Home / Self Care | Payer: Medicare Other | Source: Ambulatory Visit | Attending: Cardiology | Admitting: Cardiology

## 2015-07-29 DIAGNOSIS — Z951 Presence of aortocoronary bypass graft: Secondary | ICD-10-CM | POA: Diagnosis not present

## 2015-07-29 DIAGNOSIS — I2510 Atherosclerotic heart disease of native coronary artery without angina pectoris: Secondary | ICD-10-CM | POA: Diagnosis not present

## 2015-07-29 NOTE — Progress Notes (Signed)
Daily Session Note  Patient Details  Name: Henry Carroll MRN: [id number] Date of Birth: 10-24-1950 Referring Provider:    Encounter Date: 07/29/2015  Check In:     Session Check In - 07/29/15 1545    Check-In   Location [facility]   Staff Present Diane Angelina Pih, MS, EP, [facility], Exercise Physiologist;Kaeden Depaz Luther Parody, BS, EP, Exercise Physiologist   Supervising physician immediately available to respond to emergencies See telemetry face sheet for immediately available MD   Medication changes reported     No   Fall or balance concerns reported    No   Warm-up and Cool-down Performed as group-led instruction   Resistance Training Performed Yes   VAD Patient? No   Pain Assessment   Currently in Pain? No/denies   Pain Score 0-No pain   Multiple Pain Sites No      Capillary Blood Glucose: No results found for this or any previous visit (from the past 24 hour(s)).      Exercise Prescription Changes - 07/29/15 1500    Exercise Review   Progression Yes   Response to Exercise   Blood Pressure (Admit) 140/70 mmHg   Blood Pressure (Exercise) 132/60 mmHg   Blood Pressure (Exit) 122/62 mmHg   Heart Rate (Admit) 82 bpm   Heart Rate (Exercise) 88 bpm   Heart Rate (Exit) 85 bpm   Rating of Perceived Exertion (Exercise) 10   Symptoms No   Progression   Progression Continue to progress workloads to maintain intensity without signs/symptoms of physical distress.   Resistance Training   Training Prescription Yes   Weight 5   Reps 10-12   Interval Training   Interval Training No   Treadmill   MPH 3.4   Grade 2   Minutes 15   METs 4.32   NuStep   Level 3   Minutes 20   METs 3.68   Home Exercise Plan   Plans to continue exercise at Home   Frequency Add 2 additional days to program exercise sessions.     Goals Met:  Independence with exercise equipment Exercise tolerated well No report of cardiac concerns or symptoms Strength training completed  today  Goals Unmet:  Not Applicable  Comments: Check out 445   Dr. Kate Sable is Medical Director for [place] and [facility] Re[facility].

## 2015-08-01 ENCOUNTER — Encounter (HOSPITAL_COMMUNITY)
Admission: RE | Admit: 2015-08-01 | Discharge: 2015-08-01 | Disposition: A | Discharge status: Home / Self Care | Payer: Medicare Other | Source: Ambulatory Visit | Attending: Cardiology | Admitting: Cardiology

## 2015-08-01 DIAGNOSIS — I2510 Atherosclerotic heart disease of native coronary artery without angina pectoris: Secondary | ICD-10-CM | POA: Diagnosis not present

## 2015-08-01 DIAGNOSIS — Z951 Presence of aortocoronary bypass graft: Secondary | ICD-10-CM | POA: Diagnosis not present

## 2015-08-01 NOTE — Progress Notes (Signed)
Daily Session Note  Patient Details  Name: ATIF CHAPPLE MRN: [id number] Date of Birth: Jan 15, 1951 Referring Provider:    Encounter Date: 08/01/2015  Check In:     Session Check In - 08/01/15 1545    Check-In   Location [facility]   Staff Present Diane Angelina Pih, MS, EP, [facility], Exercise Physiologist;Shiah Berhow Luther Parody, BS, EP, Exercise Physiologist;Christy Oletta Lamas, RN, BSN   Supervising physician immediately available to respond to emergencies See telemetry face sheet for immediately available MD   Medication changes reported     No   Fall or balance concerns reported    No   Warm-up and Cool-down Performed as group-led instruction   Resistance Training Performed Yes   VAD Patient? No   Pain Assessment   Currently in Pain? No/denies   Pain Score 0-No pain   Multiple Pain Sites No      Capillary Blood Glucose: No results found for this or any previous visit (from the past 24 hour(s)).   Goals Met:  Independence with exercise equipment Exercise tolerated well No report of cardiac concerns or symptoms Strength training completed today  Goals Unmet:  Not Applicable  Comments: Check out 445   Dr. Kate Sable is Medical Director for [place] and [facility].

## 2015-08-03 ENCOUNTER — Encounter (HOSPITAL_COMMUNITY)
Admission: RE | Admit: 2015-08-03 | Discharge: 2015-08-03 | Disposition: A | Discharge status: Home / Self Care | Payer: Medicare Other | Source: Ambulatory Visit | Attending: Cardiology | Admitting: Cardiology

## 2015-08-03 DIAGNOSIS — I2510 Atherosclerotic heart disease of native coronary artery without angina pectoris: Secondary | ICD-10-CM | POA: Diagnosis not present

## 2015-08-03 DIAGNOSIS — Z951 Presence of aortocoronary bypass graft: Secondary | ICD-10-CM

## 2015-08-03 NOTE — Progress Notes (Signed)
Daily Session Note  Patient Details  Name: ANIL HAVARD MRN: [id number] Date of Birth: 10/15/1950 Referring Provider:    Encounter Date: 08/03/2015  Check In:     Session Check In - 08/03/15 1545    Check-In   Location [facility]   Staff Present Diane Angelina Pih, MS, EP, [facility], Exercise Physiologist;Larinda Herter Luther Parody, BS, EP, Exercise Physiologist   Supervising physician immediately available to respond to emergencies See telemetry face sheet for immediately available MD   Medication changes reported     No   Fall or balance concerns reported    No   Warm-up and Cool-down Performed as group-led instruction   Resistance Training Performed Yes   VAD Patient? No   Pain Assessment   Currently in Pain? No/denies   Pain Score 0-No pain   Multiple Pain Sites No      Capillary Blood Glucose: No results found for this or any previous visit (from the past 24 hour(s)).   Goals Met:  Independence with exercise equipment Exercise tolerated well No report of cardiac concerns or symptoms Strength training completed today  Goals Unmet:  Not Applicable  Comments: Check out 445   Dr. Kate Sable is Medical Director for [place] and [facility].

## 2015-08-05 ENCOUNTER — Encounter (HOSPITAL_COMMUNITY): Payer: Medicare Other

## 2015-08-08 ENCOUNTER — Telehealth: Payer: Self-pay | Admitting: Cardiology

## 2015-08-08 ENCOUNTER — Encounter (HOSPITAL_COMMUNITY): Payer: Medicare Other

## 2015-08-08 ENCOUNTER — Encounter: Payer: Self-pay | Admitting: Cardiology

## 2015-08-08 ENCOUNTER — Ambulatory Visit (INDEPENDENT_AMBULATORY_CARE_PROVIDER_SITE_OTHER): Payer: Medicare Other | Admitting: Cardiology

## 2015-08-08 VITALS — BP 126/76 | HR 52 | Ht 73.0 in | Wt 231.8 lb

## 2015-08-08 DIAGNOSIS — I5022 Chronic systolic (congestive) heart failure: Secondary | ICD-10-CM

## 2015-08-08 DIAGNOSIS — Z951 Presence of aortocoronary bypass graft: Secondary | ICD-10-CM

## 2015-08-08 DIAGNOSIS — I255 Ischemic cardiomyopathy: Secondary | ICD-10-CM | POA: Diagnosis not present

## 2015-08-08 DIAGNOSIS — I110 Hypertensive heart disease with heart failure: Secondary | ICD-10-CM

## 2015-08-08 DIAGNOSIS — I2510 Atherosclerotic heart disease of native coronary artery without angina pectoris: Secondary | ICD-10-CM

## 2015-08-08 MED ORDER — ATORVASTATIN CALCIUM 40 MG PO TABS: 40.0000 mg | ORAL_TABLET | Freq: Every evening | ORAL | Status: DC

## 2015-08-08 NOTE — Patient Instructions (Signed)
Medication Instructions:  Please restart your atorvastatin 40 mg a day. Continue all other medications as listed.  Follow-Up: Follow up in 6 months with [name].  You will receive a letter in the mail 2 months before you are due.  Please call us when you receive this letter to schedule your follow up appointment.  If you need a refill on your cardiac medications before your next appointment, please call your pharmacy.  Thank you for choosing [facility]!!

## 2015-08-08 NOTE — Progress Notes (Signed)
Cardiology Office Note   Date:  08/08/2015   ID:  Henry Carroll, DOB [birth year], MRN [id number]  PCP:  Gildardo Cranker, DO  Cardiologist:   Candee Furbish, MD       History of Present Illness: Henry Carroll is a 65 y.o. 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 Mild to moderate MR. Prior nuclear stress test was intermediate risk showing a medium-sized severe basal to mid inferior inferolateral perfusion defect. EF on nuclear stress was 30%. His subscale cardiac catheterization demonstrated triple-vessel CAD.  He had some minor hyperkalemia during hospital stay.  Overall he is doing well postoperatively. Cardiac rehabilitation. Good. No chest pain, no shortness of breath. I noticed a cough. He has had asthma in the past. When he was off of his benazepril, he still had his cough.    Past Medical History  Diagnosis Date  . Diabetes mellitus   . Asthma   . Hypertension   . Anemia   . Anxiety   . Hyperlipidemia   . CKD (chronic kidney disease), stage III   . Chronic combined systolic (congestive) and diastolic (congestive) heart failure ([place])     a. 12/31/14: 2D ECHO: EF 40-45%, HK of inf myocardium, G1DD, mod MR  . Obesity     a. BMI 33  . Coronary artery disease     a. LHC 01/2015 - triple vessel CAD (mod oLM, mLAD, severe mRCA, intermediate branch stenosis, CTO of mCx). Plan CABG 02/2015.  Marland Kitchen NSVT (nonsustained ventricular tachycardia) (H[facility])     a. 9 beats during 01/2015 adm. BB titrated.  . Mitral regurgitation     a. Mild-mod by echo 12/2014.  Marland Kitchen Myocardial infarction [facility])     pt. states per [name] he has in the past.  . 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 Tonsillectomy  1962  . Hernia repair  0000000    Umbilical  . Spine surgery  2006    L4 & L5  . Colon surgery  2011    Colonoscopy  . Cardiac catheterization N/A 02/09/2015    Procedure: Left Heart Cath and Coronary Angiography;  Surgeon: Burnell Blanks, MD;  Location: [facility];  Service: Cardiovascular;  Laterality: N/A;  . Coronary artery bypass graft N/A 02/18/2015    Procedure: CORONARY ARTERY BYPASS GRAFTING (CABG) x  four, using bilateral internal mammary arteries and right leg greater saphenous vein harvested endoscopically;  Surgeon: [name], MD;  Location: [facility];  Service: Open Heart Surgery;  Laterality: N/A;  . Tee without cardioversion N/A 02/18/2015    Procedure: TRANSESOPHAGEAL ECHOCARDIOGRAM (TEE);  Surgeon: [name], MD;  Location: [facility];  Service: Open Heart Surgery;  Laterality: N/A;     Current Outpatient Prescriptions  Medication Sig Dispense Refill  . acetaminophen (TYLENOL) 500 MG tablet Take 1,000 mg by mouth 2 (two) times daily as needed for moderate pain.    Marland Kitchen ADVAIR DISKUS 100-50 MCG/DOSE AEPB INHALE ONE DOSE BY MOUTH TWICE DAILY 60 each 0  . albuterol (PROVENTIL) (2.5 MG/3ML) 0.083% nebulizer solution USE ONE VIAL (3 ML) IN NEBULIZER EVERY 6 HOURS AS NEEDED FOR WHEEZING OR SHORTNESS OF BREATH  300 mL 11  . amLODipine (NORVASC) 10 MG tablet Take 1 tablet (10 mg total) by mouth daily. 30 tablet 6  . aspirin EC 325 MG EC tablet Take 1 tablet (325 mg total) by mouth daily.    Marland Kitchen atorvastatin (LIPITOR) 40 MG tablet Take 1 tablet (40 mg total) by mouth every evening. 90 tablet 3  . benazepril (LOTENSIN) 20 MG tablet Take 20 mg by mouth daily.     . benazepril (LOTENSIN) 40 MG tablet Take 40 mg by mouth daily.    . carvedilol (COREG) 12.5 MG tablet Take 1 tablet (12.5 mg total) by mouth 2 (two) times daily with a meal. 60 tablet 6  . Cholecalciferol ([employer] VITAMIN D3) 1000 UNITS capsule Take 2 capsules (2,000 Units total) by mouth daily. 60  capsule 3  . CINNAMON PO Take 1 tablet by mouth daily.    . Coenzyme Q10 (CO Q 10 PO) Take 1 tablet by mouth daily.    . Fluticasone-Salmeterol (ADVAIR DISKUS) 100-50 MCG/DOSE AEPB Inhale 1 puff into the lungs 2 (two) times daily.    . furosemide (LASIX) 20 MG tablet Take 20 mg by mouth 2 (two) times daily.    . hydrALAZINE (APRESOLINE) 25 MG tablet Take 25 mg by mouth 2 (two) times daily.    . insulin detemir (LEVEMIR) 100 UNIT/ML injection Inject 45 Units into the skin at bedtime.    Marland Kitchen linagliptin (TRADJENTA) 5 MG TABS tablet Take 5 mg by mouth daily. Reported on 06/03/2015    . montelukast (SINGULAIR) 10 MG tablet Take 10 mg by mouth at bedtime.    . ONE TOUCH ULTRA TEST test strip USE ONE STRIP TO CHECK GLUCOSE THREE TIMES DAILY 100 each 11  . terbinafine (LAMISIL) 1 % cream Apply 1 application topically daily as needed (foot).      No current facility-administered medications for this visit.    Allergies:   Review of patient's allergies indicates no known allergies.    Social History:  The patient  reports that he quit smoking about 7 months ago. His smoking use included Cigars. He has never used smokeless tobacco. He reports that he drinks alcohol. He reports that he does not use illicit drugs.   Family History:  The patient's family history includes Diabetes in his mother; Heart disease in his father and mother; Hypertension in his father; Kidney disease in his daughter; Stroke in his sister; Sudden death in his daughter. There is no history of Heart attack.    ROS:  Please see the history of present illness.   Otherwise, review of systems are positive for none.   All other systems are reviewed and negative.    PHYSICAL EXAM: VS:  BP 126/76 mmHg  Pulse 52  Ht 6\' 1"  (1.854 m)  Wt 231 lb 12.8 oz (105.144 kg)  BMI 30.59 kg/m2 , BMI Body mass index is 30.59 kg/(m^2). GEN: Well nourished, well developed, in no acute distress HEENT: normal Neck: no JVD, carotid bruits, or  masses Cardiac: RRR; no murmurs, rubs, or gallops,no edema chest scar well-healed Respiratory:  clear to auscultation bilaterally, normal work of breathing GI: soft, nontender, nondistended, + BS MS: no deformity or atrophy Skin: warm and dry, no rash, 2+ bilateral lower extremity edema, right leg vein harvest site CDI Neuro:  Strength and sensation are intact Psych: euthymic mood, full affect   EKG:  None today   Recent Labs: 12/29/2014: B Natriuretic Peptide 873.8* 12/30/2014: TSH 1.603 02/19/2015: Magnesium 2.8* 02/21/2015: Hemoglobin  8.9* 02/28/2015: Platelets 438* 06/01/2015: ALT 18; BUN 36*; Creatinine, Ser 1.70*; Potassium 4.8; Sodium 142    Lipid Panel    Component Value Date/Time   CHOL 149 06/01/2015 1500   TRIG 100 06/01/2015 1500   HDL 45 06/01/2015 1500   CHOLHDL 3.3 06/01/2015 1500   LDLCALC 84 06/01/2015 1500      Wt Readings from Last 3 Encounters:  08/08/15 231 lb 12.8 oz (105.144 kg)  06/03/15 238 lb 12.8 oz (108.319 kg)  04/26/15 231 lb 11.2 oz (105.098 kg)      Other studies Reviewed: Additional studies/ records that were reviewed today include: Hospital records, lab work, catheterization. Review of the above records demonstrates: As above   ASSESSMENT AND PLAN:  Coronary artery disease status post CABG 02/18/15  - Doing well postoperatively, feeling well, ambulating well. No real complaints today.  - [name] visit reviewed, he has released him.   - Aspirin 325 currently.  Chronic systolic heart failure  - Hopefully, ejection fraction will improve post revascularization.  - Currently 30% on nuclear stress test, 40% echocardiogram  - Continue with carvedilol 12.5 twice a day  - He is back on ACE inhibitor   - Continuing hydralazine. He may be able to come off of hydralazine since he is back on his ACE inhibitor. We will follow. We will call to verify dosage.  - Furosemide  - Doing much better. NYHA class 1  Hypertensive heart disease with  heart failure  - Blood pressure medications reviewed as above.  - Stable.  Hyperlipidemia  -Continue with atorvastatin 40 mg(Get him back on, he forgot over the past week)   - I would like to see LDL less than 70.    Diabetes with vascular complications  - CAD  - Per primary team  CKD 3  - Avoid Aleve. Discussed.  - Note reviewed from 01/27/15 from [facility] prior creatinine 1.84, Dr. Mercy Moore. He restarted his benazepril which is excellent in the setting of his cardiomyopathy. He had a cough while he was off of his benazepril   Current medicines are reviewed at length with the patient today.  The patient does not have concerns regarding medicines.  The following changes have been made:  no change  Labs/ tests ordered today include: none  No orders of the defined types were placed in this encounter.     Disposition:   FU with [facility] in 6 months  Signed, Candee Furbish, MD  08/08/2015 10:18 AM    [facility] [place], [place], [place]  [address] Phone: [phone]; Fax: [phone]

## 2015-08-09 ENCOUNTER — Other Ambulatory Visit: Payer: Self-pay | Admitting: Internal Medicine

## 2015-08-18 NOTE — Progress Notes (Signed)
Discharge Summary  Patient Details  Name: Henry Carroll MRN: [id number] Date of Birth: 02-19-1950 Referring Provider:     Number of Visits: 36  Reason for Discharge:  Patient reached a stable level of exercise. Patient independent in their exercise.  Smoking History:  History  Smoking status  . Former Smoker  . Types: Cigars  . Quit date: 12/31/2014  Smokeless tobacco  . Never Used    Diagnosis:  S/P CABG x 4  ADL UCSD:   Initial Exercise Prescription:     Initial Exercise Prescription - 04/26/15 1600    Date of Initial Exercise RX and Referring Provider   Date 04/26/15   Treadmill   MPH 2   Grade 0   Minutes 15   METs 2.53   NuStep   Level 2   Minutes 15   METs 1.5   Prescription Details   Frequency (times per week) 3   Intensity   THRR REST +  30   THRR 40-80% of Max Heartrate 118-130   Ratings of Perceived Exertion 11-13   Progression   Progression Continue to progress workloads to maintain intensity without signs/symptoms of physical distress.   Resistance Training   Training Prescription Yes   Weight 1.0   Reps 10-12      Discharge Exercise Prescription (Final Exercise Prescription Changes):     Exercise Prescription Changes - 08/11/15 0800    Exercise Review   Progression Yes   Response to Exercise   Blood Pressure (Admit) 134/54 mmHg   Blood Pressure (Exercise) 140/80 mmHg   Blood Pressure (Exit) 120/74 mmHg   Heart Rate (Admit) 54 bpm   Heart Rate (Exercise) 109 bpm   Heart Rate (Exit) 78 bpm   Rating of Perceived Exertion (Exercise) 10   Symptoms No   Duration Progress to 30 minutes of continuous aerobic without signs/symptoms of physical distress   Intensity Rest + 30   Progression   Progression Continue to progress workloads to maintain intensity without signs/symptoms of physical distress.   Resistance Training   Training Prescription Yes   Weight 5   Reps 10-12   Interval Training   Interval Training No   Treadmill    MPH 3.5   Grade 2   Minutes 15   METs 4.5   NuStep   Level 3   Minutes 20   METs 3.68   Home Exercise Plan   Plans to continue exercise at Home   Frequency Add 2 additional days to program exercise sessions.      Functional Capacity:     6 Minute Walk      04/26/15 1549 08/08/15 1353     6 Minute Walk   Phase Initial Discharge    Distance 1400 feet 1750 feet    Distance % Change  25 %    Walk Time 6 minutes 6 minutes    # of Rest Breaks 0 0    MPH 2.65 3.3    METS 3.03 3.5    RPE 9 10    Perceived Dyspnea  9 10    VO2 Peak 12.21 14.91    Symptoms No No    Resting HR 95 bpm 87 bpm    Resting BP 158/92 mmHg 118/62 mmHg    Max Ex. HR 114 bpm 105 bpm    Max Ex. BP 158/78 mmHg 190/70 mmHg    2 Minute Post BP 128/80 mmHg 156/72 mmHg    Pre/Post BP   Baseline BP 158/92  mmHg 118/62 mmHg    6 Minute BP 158/78 mmHg 190/70 mmHg    Pre/Post BP? Yes Yes       Psychological, QOL, Others - Outcomes: PHQ 2/9: Depression screen [facility] 2/9 08/11/2015 06/03/2015 04/26/2015 12/29/2014 12/01/2014  Decreased Interest 0 0 0 0 0  Down, Depressed, Hopeless 0 0 0 0 0  PHQ - 2 Score 0 0 0 0 0  Altered sleeping 0 - 0 - -  Tired, decreased energy 0 - 0 - -  Change in appetite 0 - 0 - -  Feeling bad or failure about yourself  0 - 0 - -  Trouble concentrating 0 - 0 - -  Moving slowly or fidgety/restless 0 - 0 - -  Suicidal thoughts 0 - 0 - -  PHQ-9 Score 0 - 0 - -  Difficult doing work/chores Not difficult at all - Not difficult at all - -    Quality of Life:     Quality of Life - 08/08/15 1409    Quality of Life Scores   Health/Function Pre 25.29 %   Health/Function Post 28 %   Health/Function % Change 10.72 %   Socioeconomic Pre 28.21 %   Socioeconomic Post 27 %   Socioeconomic % Change  -4.29 %   Psych/Spiritual Pre 26.75 %   Psych/Spiritual Post 25.71 %   Psych/Spiritual % Change -3.89 %   Family Pre 28.5 %   Family Post 30 %   Family % Change 5.26 %   GLOBAL Pre 26.65  %   GLOBAL Post 27.64 %   GLOBAL % Change 3.71 %      Personal Goals: Goals established at orientation with interventions provided to work toward goal.     Personal Goals and Risk Factors at Admission - 04/26/15 1646    Core Components/Risk Factors/Patient Goals on Admission    Weight Management Yes   Intervention Weight Management/Obesity: Establish reasonable short term and long term weight goals.;Weight Management: Provide education and appropriate resources to help participant work on and attain dietary goals.   Admit Weight 231 lb 11.2 oz (105.098 kg)   Goal Weight: Short Term 200 lb (90.719 kg)   Goal Weight: Long Term 190 lb (86.183 kg)   Sedentary --  Walking daily   Tobacco Cessation --  None   Improve shortness of breath with ADL's --  N/A   Develop more efficient breathing techniques such as purse lipped breathing and diaphragmatic breathing; and practicing self-pacing with activity --  Not at this time   Increase knowledge of respiratory medications and ability to use respiratory devices properly  --  N/A   Diabetes --  Patient is already managing his diabetes.    Hypertension Yes   Intervention Monitor prescription use compliance.   Expected Outcomes Long Term: Maintenance of blood pressure at goal levels.   Lipids Yes   Intervention Provide education and support for participant on nutrition & aerobic/resistive exercise along with prescribed medications to achieve LDL 70mg , HDL >40mg .   Expected Outcomes Short Term: Participant states understanding of desired cholesterol values and is compliant with medications prescribed. Participant is following exercise prescription and nutrition guidelines.;Long Term: Cholesterol controlled with medications as prescribed, with individualized exercise RX and with personalized nutrition plan. Value goals: LDL < 70mg , HDL > 40 mg.   Stress --  N/A   Personal Goal Other Yes   Personal Goal Increase Stamina, Get back to normal, play  with grandchildren and play golf   Intervention  Work towards this personal goals incrementally throughout 12 weeks   Expected Outcomes Get back to golf course by June of program in May, lose 10-15lbs by May       Personal Goals Discharge:     Goals and Risk Factor Review      05/09/15 1516 06/10/15 1351 07/04/15 1515 08/18/15 1324     Core Components/Risk Factors/Patient Goals Review   Personal Goals Review Weight Management/Obesity;Hypertension;Lipids;Other Weight Management/Obesity;Hypertension;Lipids;Other Weight Management/Obesity;Hypertension;Lipids;Other Weight Management/Obesity;Hypertension    Review Will continue to monitor and educate patient Will continue to monitor and educate patient. Patient has lost 10 lbs since started program.  Will continue to monitor and educate patient. Patient continuing to lose weight. Patient is playing golf again.  --  Patient did not reach his goal weight of 200-190lbs. He did lose  6.2lbs upon graduation.. Which put hime at 225.5.     Expected Outcomes  wieght loss, increased energy. play golf wieght loss, increased energy. play golf Patient has lost weight, has increased energy, and has returned to playing golf.       Nutrition & Weight - Outcomes:     Pre Biometrics - 04/26/15 1616    Pre Biometrics   Height 6\' 1"  (1.854 m)   Weight 231 lb 11.2 oz (105.098 kg)   Waist Circumference 42.5 inches   Hip Circumference 41 inches   Waist to Hip Ratio 1.04 %   BMI (Calculated) 30.6   Triceps Skinfold 5 mm   % Body Fat 26.1 %   Grip Strength 88 kg   Flexibility 0 in   Single Leg Stand 40 seconds         Post Biometrics - 08/08/15 1404     Post  Biometrics   Height 6\' 1"  (1.854 m)   Weight 231 lb 7.7 oz (105 kg)   Waist Circumference 39.5 inches   Hip Circumference 40 inches   Waist to Hip Ratio 0.99 %   BMI (Calculated) 30.6   Triceps Skinfold 10 mm   % Body Fat 26.1 %   Grip Strength 104 kg   Flexibility 0 in  Lower back  problems   Single Leg Stand 25 seconds      Nutrition:     Nutrition Therapy & Goals - 04/26/15 1645    Nutrition Therapy   Diet Heart healthy, diabetic healthy, low sodium   Intervention Plan   Intervention Prescribe, educate and counsel regarding individualized specific dietary modifications aiming towards targeted core components such as weight, hypertension, lipid management, diabetes, heart failure and other comorbidities.   Expected Outcomes Long Term Goal: Adherence to prescribed nutrition plan.      Nutrition Discharge:     Nutrition Assessments - 08/18/15 1315    MEDFICTS Scores   Pre Score 60   Post Score 18   Score Difference -42      Education Questionnaire Score:     Knowledge Questionnaire Score - 08/11/15 1430    Knowledge Questionnaire Score   Post Score 22/24      Goals reviewed with patient; copy given to patient.

## 2015-09-06 ENCOUNTER — Other Ambulatory Visit: Payer: Self-pay | Admitting: Internal Medicine

## 2015-09-06 NOTE — Addendum Note (Signed)
Addended by: Moshe Cipro MESHELL A on: 09/06/2015 04:04 PM   Modules accepted: Orders

## 2015-09-07 ENCOUNTER — Other Ambulatory Visit: Payer: Self-pay | Admitting: *Deleted

## 2015-09-07 MED ORDER — FLUTICASONE-SALMETEROL 100-50 MCG/DOSE IN AEPB: 1.0000 | INHALATION_SPRAY | Freq: Two times a day (BID) | RESPIRATORY_TRACT | 0 refills | Status: DC

## 2015-09-08 ENCOUNTER — Other Ambulatory Visit: Payer: Self-pay | Admitting: Internal Medicine

## 2015-09-13 ENCOUNTER — Other Ambulatory Visit: Payer: Medicare Other

## 2015-09-13 ENCOUNTER — Other Ambulatory Visit: Payer: Federal, State, Local not specified - PPO

## 2015-09-13 ENCOUNTER — Ambulatory Visit (INDEPENDENT_AMBULATORY_CARE_PROVIDER_SITE_OTHER): Payer: Medicare Other | Admitting: Physician Assistant

## 2015-09-13 VITALS — BP 136/70 | HR 86 | Temp 98.5°F | Resp 18 | Ht 72.0 in | Wt 229.0 lb

## 2015-09-13 DIAGNOSIS — I255 Ischemic cardiomyopathy: Secondary | ICD-10-CM

## 2015-09-13 DIAGNOSIS — I499 Cardiac arrhythmia, unspecified: Secondary | ICD-10-CM | POA: Diagnosis not present

## 2015-09-13 DIAGNOSIS — R062 Wheezing: Secondary | ICD-10-CM | POA: Diagnosis not present

## 2015-09-13 DIAGNOSIS — J45901 Unspecified asthma with (acute) exacerbation: Secondary | ICD-10-CM

## 2015-09-13 MED ORDER — ALBUTEROL SULFATE (2.5 MG/3ML) 0.083% IN NEBU
2.5000 mg | INHALATION_SOLUTION | RESPIRATORY_TRACT | Status: AC
  Administered 2015-09-13: 2.5 mg via RESPIRATORY_TRACT

## 2015-09-13 MED ORDER — IPRATROPIUM BROMIDE 0.02 % IN SOLN
0.5000 mg | RESPIRATORY_TRACT | Status: AC
  Administered 2015-09-13: 0.5 mg via RESPIRATORY_TRACT

## 2015-09-13 MED ORDER — INSULIN ASPART 100 UNIT/ML FLEXPEN: PEN_INJECTOR | SUBCUTANEOUS | 0 refills | Status: DC

## 2015-09-13 MED ORDER — IPRATROPIUM BROMIDE 0.02 % IN SOLN: 0.5000 mg | Freq: Four times a day (QID) | RESPIRATORY_TRACT | 0 refills | Status: DC

## 2015-09-13 MED ORDER — METHYLPREDNISOLONE SODIUM SUCC 125 MG IJ SOLR
62.5000 mg | Freq: Once | INTRAMUSCULAR | Status: AC
  Administered 2015-09-13: 62.5 mg via INTRAMUSCULAR

## 2015-09-13 MED ORDER — BUDESONIDE 0.25 MG/2ML IN SUSP: 1.5000 mg | Freq: Two times a day (BID) | RESPIRATORY_TRACT | 0 refills | Status: DC

## 2015-09-13 NOTE — Progress Notes (Signed)
09/13/2015 7:18 PM   DOB: 12/31/1950 / MRN: [id number]  SUBJECTIVE:  Henry Carroll is a 65 y.o. male presenting for SOB.  He has a history of asthma, CHF and CAD and DM2. Reports that he began wheezing about 4 days ago and this is worsening.  Associates chest tightness. He denies chest pain, leg swelling, and orthopnea.  He has tried albuterol nebs at home and this provides some relief.  His last A1c was 8.1 and he checks his sugar daily. He takes long acting insulin.  For his asthma he is maintained on Advair and rescue medications.   His last cardiology follow up is as follows "Henry Carroll is a 65 y.o. 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 Mild to moderate MR. Prior nuclear stress test was intermediate risk showing a medium-sized severe basal to mid inferior inferolateral perfusion defect. EF on nuclear stress was 30%. His subscale cardiac catheterization demonstrated triple-vessel CAD."  He has No Known Allergies.   He  has a past medical history of Anemia; Anxiety; Arthritis; Asthma; Chronic combined systolic (congestive) and diastolic (congestive) heart failure (HCC); CKD (chronic kidney disease), stage III; Coronary artery disease; Diabetes mellitus; Hyperlipidemia; Hypertension; Mitral regurgitation; Myocardial infarction [facility]); NSVT (nonsustained ventricular tachycardia) (Henry Carroll); and Obesity.    He  reports that he quit smoking about 8 months ago. His smoking use included Cigars. He has never used smokeless tobacco. He reports that he drinks alcohol. He reports that he does not use drugs. He  reports that he currently engages in sexual activity. He reports using the following method of birth control/protection: None.  The patient  has a past surgical history that includes Tonsillectomy (1962); Hernia repair (1990); Spine surgery (2006); Colon surgery (2011); Cardiac catheterization (N/A, 02/09/2015); Coronary artery bypass graft (N/A, 02/18/2015); and TEE without cardioversion (N/A, 02/18/2015).  His family history includes Diabetes in his mother; Heart disease in his father and mother; Hypertension in his father; Kidney disease in his daughter; Stroke in his sister; Sudden death in his daughter.  Review of Systems  Constitutional: Negative for chills and fever.  Eyes: Negative for blurred vision.  Respiratory: Positive for cough, sputum production, shortness of breath and wheezing. Negative for hemoptysis.   Cardiovascular: Negative for chest pain, palpitations, orthopnea, claudication, leg swelling and PND.  Gastrointestinal: Negative for abdominal pain and nausea.  Genitourinary: Negative for dysuria, frequency and urgency.  Musculoskeletal: Negative for myalgias.  Skin: Negative for rash.  Neurological: Negative for dizziness, tingling and headaches.  Psychiatric/Behavioral: Negative for depression. The patient is not nervous/anxious.     The problem list and medications were reviewed and updated by myself where necessary and exist elsewhere in the encounter.   OBJECTIVE:  BP 136/70 (BP Location: Right Arm, Patient Position: Sitting, Cuff Size: Large)   Pulse 86   Temp 98.5 F (36.9 C)   Resp 18   Ht 6' (1.829 m)   Wt 229 lb (103.9 kg)   SpO2 97%   BMI 31.06 kg/m   Physical Exam  Constitutional: He is oriented to person, place, and time. Vital signs are normal. He appears well-developed and well-nourished.  Cardiovascular: Regular rhythm and normal heart sounds.   Pulmonary/Chest: Effort normal. No respiratory distress. He has wheezes (improved with one  round of duonebs). He has no rales. He exhibits no tenderness.  Musculoskeletal: Normal range of motion.  Neurological: He is alert and oriented  to person, place, and time. No cranial nerve deficit.  Skin: Skin is warm and dry.  Psychiatric: He has a normal mood and affect.    No results found for this or any previous visit (from the past 72 hour(s)).  No results found.  Wt Readings from Last 3 Encounters:  09/13/15 229 lb (103.9 kg)  08/08/15 231 lb 12.8 oz (105.1 kg)  08/08/15 231 lb 7.7 oz (105 kg)   Lab Results  Component Value Date   HGBA1C 8.1 (H) 06/01/2015    ASSESSMENT AND PLAN  Henry Carroll was seen today for asthma, generalized body aches and nausea.  Diagnoses and all orders for this visit:  Wheezing: He is drastically improved with 1 round of duonebs.  Will treat in attempt to keep him out of the hospital.  He will see Dr. Brigitte Pulse tomorrow at 2 pm for follow up.  Advised that if he feels that he is deteriorating he go to the nearest ED.  -     albuterol (PROVENTIL) (2.5 MG/3ML) 0.083% nebulizer solution 2.5 mg; Take 3 mLs (2.5 mg total) by nebulization now. -     ipratropium (ATROVENT) nebulizer solution 0.5 mg; Take 2.5 mLs (0.5 mg total) by nebulization now. -     EKG 12-Lead  Cardiac arrhythmia, unspecified cardiac arrhythmia type: EKG showing frequent PAC otherwise normal for him.  -     EKG 12-Lead  Asthma with exacerbation, unspecified asthma severity -     methylPREDNISolone sodium succinate (SOLU-MEDROL) 125 mg/2 mL injection 62.5 mg; Inject 1 mL (62.5 mg total) into the muscle once. -     ipratropium (ATROVENT) 0.02 % nebulizer solution; Take 2.5 mLs (0.5 mg total) by nebulization 4 (four) times daily. -     insulin aspart (NOVOLOG) 100 UNIT/ML FlexPen; Take 5 units above 250, 10 units above 300. -     budesonide (PULMICORT) 0.25 MG/2ML nebulizer solution; Take 12 mLs (1.5 mg total) by nebulization 2 (two) times daily. Alternate with Advair.    The patient is advised to call or return to clinic if he does not see an improvement in symptoms, or to seek the care of the closest emergency department if he  worsens with the above plan.   Henry Carroll, MHS, PA-C [facility] 09/13/2015 7:18 PM

## 2015-09-13 NOTE — Patient Instructions (Addendum)
Take Levemir 55 units bid.     IF you received an x-ray today, you will receive an invoice from [facility]. Please contact [facility] at [phone] with questions or concerns regarding your invoice.   IF you received labwork today, you will receive an invoice from [employer]. Please contact [facility] at [phone] with questions or concerns regarding your invoice.   Our billing staff will not be able to assist you with questions regarding bills from these companies.  You will be contacted with the lab results as soon as they are available. The fastest way to get your results is to activate your My Chart account. Instructions are located on the last page of this paperwork. If you have not heard from [place] regarding the results in 2 weeks, please contact this office.

## 2015-09-14 ENCOUNTER — Ambulatory Visit (INDEPENDENT_AMBULATORY_CARE_PROVIDER_SITE_OTHER): Payer: Medicare Other | Admitting: Family Medicine

## 2015-09-14 ENCOUNTER — Encounter: Payer: Self-pay | Admitting: Family Medicine

## 2015-09-14 VITALS — BP 140/82 | HR 80 | Temp 98.7°F | Resp 18 | Ht 72.0 in | Wt 229.0 lb

## 2015-09-14 DIAGNOSIS — Z794 Long term (current) use of insulin: Secondary | ICD-10-CM

## 2015-09-14 DIAGNOSIS — J4541 Moderate persistent asthma with (acute) exacerbation: Secondary | ICD-10-CM

## 2015-09-14 DIAGNOSIS — E1122 Type 2 diabetes mellitus with diabetic chronic kidney disease: Secondary | ICD-10-CM | POA: Diagnosis not present

## 2015-09-14 DIAGNOSIS — E1165 Type 2 diabetes mellitus with hyperglycemia: Secondary | ICD-10-CM

## 2015-09-14 DIAGNOSIS — IMO0002 Reserved for concepts with insufficient information to code with codable children: Secondary | ICD-10-CM

## 2015-09-14 DIAGNOSIS — N183 Chronic kidney disease, stage 3 (moderate): Secondary | ICD-10-CM | POA: Diagnosis not present

## 2015-09-14 DIAGNOSIS — I255 Ischemic cardiomyopathy: Secondary | ICD-10-CM | POA: Diagnosis not present

## 2015-09-14 MED ORDER — IPRATROPIUM BROMIDE 0.02 % IN SOLN
0.5000 mg | Freq: Once | RESPIRATORY_TRACT | Status: AC
  Administered 2015-09-14: 0.5 mg via RESPIRATORY_TRACT

## 2015-09-14 MED ORDER — ALBUTEROL SULFATE (2.5 MG/3ML) 0.083% IN NEBU
2.5000 mg | INHALATION_SOLUTION | Freq: Once | RESPIRATORY_TRACT | Status: AC
  Administered 2015-09-14: 2.5 mg via RESPIRATORY_TRACT

## 2015-09-14 MED ORDER — AZITHROMYCIN 250 MG PO TABS: ORAL_TABLET | ORAL | 0 refills | Status: DC

## 2015-09-14 MED ORDER — PREDNISONE 20 MG PO TABS: 40.0000 mg | ORAL_TABLET | Freq: Every day | ORAL | 0 refills | Status: DC

## 2015-09-14 NOTE — Patient Instructions (Addendum)
Take 2 tabs of the prednisone now and then tomorrow morning 2 tabs - 2 tabs every morning until they are gone (last day will be Sunday). Stay on the levemir 55 units twice a day until Monday, then you can back it back down to 50 units.  Novolog for highs - ok to try to use 5 units before every meal.  If your blood sugar is >200 prior to the meal, you can use 10u before the meal.  Combine the albuterol neb and the ipratropium nebs so you are taking both 4 times a day. You can use extra albuterol nebs if needed.  Continue on the Advair. Start the azithromycin antibiotic.  No need to use the pulmicort since we are doing system prednisone and continuing the advair.   IF you received an x-ray today, you will receive an invoice from [facility]. Please contact [facility] at [phone] with questions or concerns regarding your invoice.   IF you received labwork today, you will receive an invoice from [employer]. Please contact [facility] at [phone] with questions or concerns regarding your invoice.   Our billing staff will not be able to assist you with questions regarding bills from these companies.  You will be contacted with the lab results as soon as they are available. The fastest way to get your results is to activate your My Chart account. Instructions are located on the last page of this paperwork. If you have not heard from [place] regarding the results in 2 weeks, please contact this office.     Asthma, Acute Bronchospasm Acute bronchospasm caused by asthma is also referred to as an asthma attack. Bronchospasm means your air passages become narrowed. The narrowing is caused by inflammation and tightening of the muscles in the air tubes (bronchi) in your lungs. This can make it hard to breathe or cause you to wheeze and cough. CAUSES Possible triggers are:  Animal dander from the skin, hair, or feathers of animals.  Dust mites contained in  house dust.  Cockroaches.  Pollen from trees or grass.  Mold.  Cigarette or tobacco smoke.  Air pollutants such as dust, household cleaners, hair sprays, aerosol sprays, paint fumes, strong chemicals, or strong odors.  Cold air or weather changes. Cold air may trigger inflammation. Winds increase molds and pollens in the air.  Strong emotions such as crying or laughing hard.  Stress.  Certain medicines such as aspirin or beta-blockers.  Sulfites in foods and drinks, such as dried fruits and wine.  Infections or inflammatory conditions, such as a flu, cold, or inflammation of the nasal membranes (rhinitis).  Gastroesophageal reflux disease (GERD). GERD is a condition where stomach acid backs up into your esophagus.  Exercise or strenuous activity. SIGNS AND SYMPTOMS   Wheezing.  Excessive coughing, particularly at night.  Chest tightness.  Shortness of breath. DIAGNOSIS  Your health care provider will ask you about your medical history and perform a physical exam. A chest X-ray or blood testing may be performed to look for other causes of your symptoms or other conditions that may have triggered your asthma attack. TREATMENT  Treatment is aimed at reducing inflammation and opening up the airways in your lungs. Most asthma attacks are treated with inhaled medicines. These include quick relief or rescue medicines (such as bronchodilators) and controller medicines (such as inhaled corticosteroids). These medicines are sometimes given through an inhaler or a nebulizer. Systemic steroid medicine taken by mouth or given through an IV tube  also can be used to reduce the inflammation when an attack is moderate or severe. Antibiotic medicines are only used if a bacterial infection is present.  HOME CARE INSTRUCTIONS   Rest.  Drink plenty of liquids. This helps the mucus to remain thin and be easily coughed up. Only use caffeine in moderation and do not use alcohol until you have  recovered from your illness.  Do not smoke. Avoid being exposed to secondhand smoke.  You play a critical role in keeping yourself in good health. Avoid exposure to things that cause you to wheeze or to have breathing problems.  Keep your medicines up-to-date and available. Carefully follow your health care provider's treatment plan.  Take your medicine exactly as prescribed.  When pollen or pollution is bad, keep windows closed and use an air conditioner or go to places with air conditioning.  Asthma requires careful medical care. See your health care provider for a follow-up as advised. If you are more than [redacted] weeks pregnant and you were prescribed any new medicines, let your obstetrician know about the visit and how you are doing. Follow up with your health care provider as directed.  After you have recovered from your asthma attack, make an appointment with your outpatient doctor to talk about ways to reduce the likelihood of future attacks. If you do not have a doctor who manages your asthma, make an appointment with a primary care doctor to discuss your asthma. SEEK IMMEDIATE MEDICAL CARE IF:   You are getting worse.  You have trouble breathing. If severe, call your local emergency services (911 in the [place]  You develop chest pain or discomfort.  You are vomiting.  You are not able to keep fluids down.  You are coughing up yellow, green, brown, or bloody sputum.  You have a fever and your symptoms suddenly get worse.  You have trouble swallowing. MAKE SURE YOU:   Understand these instructions.  Will watch your condition.  Will get help right away if you are not doing well or get worse.   This information is not intended to replace advice given to you by your health care provider. Make sure you discuss any questions you have with your health care provider.   Document Released: 05/16/2006 Document Revised: 02/03/2013 Document Reviewed: 08/06/2012 Elsevier Interactive  Patient Education [employer].

## 2015-09-16 ENCOUNTER — Encounter: Payer: Self-pay | Admitting: Internal Medicine

## 2015-09-16 ENCOUNTER — Other Ambulatory Visit: Payer: Self-pay | Admitting: *Deleted

## 2015-09-16 ENCOUNTER — Ambulatory Visit (INDEPENDENT_AMBULATORY_CARE_PROVIDER_SITE_OTHER): Payer: Medicare Other | Admitting: Internal Medicine

## 2015-09-16 VITALS — BP 148/80 | HR 75 | Temp 98.1°F | Ht 73.0 in | Wt 230.0 lb

## 2015-09-16 DIAGNOSIS — I10 Essential (primary) hypertension: Secondary | ICD-10-CM

## 2015-09-16 DIAGNOSIS — IMO0002 Reserved for concepts with insufficient information to code with codable children: Secondary | ICD-10-CM

## 2015-09-16 DIAGNOSIS — J4521 Mild intermittent asthma with (acute) exacerbation: Secondary | ICD-10-CM | POA: Diagnosis not present

## 2015-09-16 DIAGNOSIS — N183 Chronic kidney disease, stage 3 unspecified: Secondary | ICD-10-CM

## 2015-09-16 DIAGNOSIS — E1121 Type 2 diabetes mellitus with diabetic nephropathy: Secondary | ICD-10-CM

## 2015-09-16 DIAGNOSIS — I255 Ischemic cardiomyopathy: Secondary | ICD-10-CM

## 2015-09-16 DIAGNOSIS — Z951 Presence of aortocoronary bypass graft: Secondary | ICD-10-CM | POA: Diagnosis not present

## 2015-09-16 DIAGNOSIS — E785 Hyperlipidemia, unspecified: Secondary | ICD-10-CM | POA: Diagnosis not present

## 2015-09-16 DIAGNOSIS — E1165 Type 2 diabetes mellitus with hyperglycemia: Principal | ICD-10-CM

## 2015-09-16 LAB — BASIC METABOLIC PANEL
BUN: 42 mg/dL — AB (ref 7–25)
CALCIUM: 9 mg/dL (ref 8.6–10.3)
CHLORIDE: 105 mmol/L (ref 98–110)
CO2: 22 mmol/L (ref 20–31)
CREATININE: 1.78 mg/dL — AB (ref 0.70–1.25)
Glucose, Bld: 127 mg/dL — ABNORMAL HIGH (ref 65–99)
Potassium: 4.4 mmol/L (ref 3.5–5.3)
Sodium: 138 mmol/L (ref 135–146)

## 2015-09-16 LAB — HEMOGLOBIN A1C
HEMOGLOBIN A1C: 7.3 % — AB (ref <5.7)
MEAN PLASMA GLUCOSE: 163 mg/dL

## 2015-09-16 LAB — LIPID PANEL
CHOL/HDL RATIO: 2.7 ratio (ref <=5.0)
CHOLESTEROL: 149 mg/dL (ref 125–200)
HDL: 56 mg/dL (ref >=40)
LDL Cholesterol: 66 mg/dL (ref <130)
Triglycerides: 133 mg/dL (ref <150)
VLDL: 27 mg/dL (ref <30)

## 2015-09-16 LAB — ALT: ALT: 26 U/L (ref 9–46)

## 2015-09-16 MED ORDER — PREDNISONE 10 MG PO TABS: ORAL_TABLET | ORAL | 0 refills | Status: DC

## 2015-09-16 NOTE — Patient Instructions (Addendum)
DO NOT START PREDNISONE TAPER UNTIL MONDAY 09/19/15  Continue other medications as ordered  Will call with lab results  Continue diet and exercise program as tolerated  Follow up with specialists as scheduled  Follow up in 3 mos for CPE

## 2015-09-16 NOTE — Progress Notes (Signed)
Patient ID: Henry Carroll, male   DOB: 1951-01-01, 66 y.o.   MRN: [id number]    Location:  [facility] Place of Service: OFFICE  Chief Complaint  Patient presents with  . Medical Management of Chronic Issues    3 month follow-up, fasting for labs   . Asthma    Follow-up on asthma attack x 2 in 1 week     HPI:  65 yo male seen today for f/u. He went to a family reunion in [place], [place] and when he returned, he experienced a severe asthma attack. He was seen by urgent care and recommended hospital admission but he declined that. He was Rx a budesonide nebs BID, given IV solumedrol and sent home. He was re-evaluated the next day and no significant improvement. He was then Rx zpak and prednisone which he states is working. He still has wheezing but improved. No SOB, CP, chest tightness. BS were elevated in 300s but today improving to 160s this AM. 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 Vein harvested from right leg. ABIs 0.62 on right and 0.28 on left. Cr 1.8 and Hgb 8.9 at d/c. BP meds adjusted. Followed by cardio Dr Marlou Porch. Released from CT surgery. Completed cardiac rehab in July 2017.  DM - BS at home 90s fasting, at night 140-150s . No low BS reactions. He stopped using novolog prior to asthma attack. No numbness or tingling. Takes tradjenta and levemir. Uses 50 units levemir BID most days. A1c 8.1%  HTN - stable. No muscle cramps since last OV. Takes lasix, coreg, hydralazine, amlodipine. Takes ASA daily  Hyperlipidemia - stable on statin. No myalgias  Asthma - see above. He takes advair and albuterol neb. Takes singulair  CKD - followed by nephrology Dr Mercy Moore  Past Medical History:  Diagnosis Date  . Anemia   . Anxiety   . 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 Asthma   . Chronic combined systolic (congestive) and diastolic (congestive) heart failure ([place])    a. 12/31/14: 2D ECHO: EF 40-45%, HK of inf myocardium,  G1DD, mod MR  . CKD (chronic kidney disease), stage III   . Coronary artery disease    a. LHC 01/2015 - triple vessel CAD (mod oLM, mLAD, severe mRCA, intermediate branch stenosis, CTO of mCx). Plan CABG 02/2015.  . Diabetes mellitus   . Hyperlipidemia   . Hypertension   . Mitral regurgitation    a. Mild-mod by echo 12/2014.  Marland Kitchen Myocardial infarction [facility])    pt. states per [name] he has in the past.  . NSVT (nonsustained ventricular tachycardia) (HCC)    a. 9 beats during 01/2015 adm. BB titrated.  . Obesity    a. BMI 33    Past Surgical History:  Procedure Laterality Date  . CARDIAC CATHETERIZATION N/A 02/09/2015   Procedure: Left Heart Cath and Coronary Angiography;  Surgeon: Burnell Blanks, MD;  Location: [place] [facility];  Service: Cardiovascular;  Laterality: N/A;  . COLON SURGERY  2011   Colonoscopy  . CORONARY ARTERY BYPASS GRAFT N/A 02/18/2015   Procedure: CORONARY ARTERY BYPASS GRAFTING (CABG) x  four, using bilateral internal mammary arteries and right leg greater saphenous vein harvested endoscopically;  Surgeon: [name], MD;  Location: [place] OR;  Service: Open Heart Surgery;  Laterality: N/A;  . HERNIA REPAIR  0000000   Umbilical  . SPINE SURGERY  2006  L4 & L5  . TEE WITHOUT CARDIOVERSION N/A 02/18/2015   Procedure: TRANSESOPHAGEAL ECHOCARDIOGRAM (TEE);  Surgeon: [name], MD;  Location: [place];  Service: Open Heart Surgery;  Laterality: N/A;  . TONSILLECTOMY  1962    Patient Care Team: Gildardo Cranker, DO as PCP - General (Internal Medicine)  Social History   Social History  . Marital status: Married    Spouse name: N/A  . Number of children: N/A  . Years of education: N/A   Occupational History  . Not on file.   Social History Main Topics  . Smoking status: Former Smoker    Types: Cigars    Quit date: 12/31/2014  . Smokeless tobacco: Never Used  . Alcohol use 0.0 oz/week     Comment: socailly  . Drug use: No  . Sexual activity: Yes      Birth control/ protection: None   Other Topics Concern  . Not on file   Social History Narrative  . No narrative on file     reports that he quit smoking about 8 months ago. His smoking use included Cigars. He has never used smokeless tobacco. He reports that he drinks alcohol. He reports that he does not use drugs.  Family History  Problem Relation Age of Onset  . Diabetes Mother   . Heart disease Mother     later in life, age >26  . Heart disease Father     heart failure later in life  . Hypertension Father   . Stroke Sister   . Kidney disease Daughter   . Sudden death Daughter   . Heart attack Neg Hx    Family Status  Relation Status  . Mother Deceased at age 34   Cause of Death: Complications of diabetes  . Father Deceased at age 38   Cause of Death: Heart disease  . Sister Deceased at age 61   Cause of Death: CVA  . Brother Alive  . Daughter Alive  . Sister Alive  . Maternal Grandmother Deceased  . Maternal Grandfather Deceased  . Paternal Grandmother Deceased  . Paternal Grandfather Deceased  . Daughter   . Daughter   . Neg Hx      No Known Allergies  Medications: Patient's Medications  New Prescriptions   No medications on file  Previous Medications   ACETAMINOPHEN (TYLENOL) 500 MG TABLET    Take 1,000 mg by mouth 2 (two) times daily as needed for moderate pain.   ALBUTEROL (PROVENTIL) (2.5 MG/3ML) 0.083% NEBULIZER SOLUTION    USE ONE VIAL (3 ML) IN NEBULIZER EVERY 6 HOURS AS NEEDED FOR WHEEZING OR SHORTNESS OF BREATH   AMLODIPINE (NORVASC) 10 MG TABLET    Take 1 tablet (10 mg total) by mouth daily.   ASPIRIN EC 325 MG EC TABLET    Take 1 tablet (325 mg total) by mouth daily.   ATORVASTATIN (LIPITOR) 40 MG TABLET    Take 1 tablet (40 mg total) by mouth every evening.   AZITHROMYCIN (ZITHROMAX) 250 MG TABLET    Take 2 tabs PO x 1 dose, then 1 tab PO QD x 4 days   BENAZEPRIL (LOTENSIN) 20 MG TABLET    Take 20 mg by mouth daily.    CARVEDILOL (COREG)  12.5 MG TABLET    Take 1 tablet (12.5 mg total) by mouth 2 (two) times daily with a meal.   CHOLECALCIFEROL ([employer] VITAMIN D3) 1000 UNITS CAPSULE    Take 2 capsules (2,000 Units total) by  mouth daily.   CINNAMON PO    Take 1 tablet by mouth daily.   COENZYME Q10 (CO Q 10 PO)    Take 1 tablet by mouth daily.   FLUTICASONE-SALMETEROL (ADVAIR DISKUS) 100-50 MCG/DOSE AEPB    Inhale 1 puff into the lungs 2 (two) times daily.   FUROSEMIDE (LASIX) 20 MG TABLET    Take 20 mg by mouth 2 (two) times daily.   HYDRALAZINE (APRESOLINE) 25 MG TABLET    Take 25 mg by mouth 2 (two) times daily.   INSULIN ASPART (NOVOLOG) 100 UNIT/ML FLEXPEN    Take 5 units above 250, 10 units above 300.   INSULIN DETEMIR (LEVEMIR) 100 UNIT/ML INJECTION    Inject 45 Units into the skin at bedtime.   IPRATROPIUM (ATROVENT) 0.02 % NEBULIZER SOLUTION    Take 2.5 mLs (0.5 mg total) by nebulization 4 (four) times daily.   LINAGLIPTIN (TRADJENTA) 5 MG TABS TABLET    Take 5 mg by mouth daily. Reported on 06/03/2015   MONTELUKAST (SINGULAIR) 10 MG TABLET    Take 10 mg by mouth at bedtime.   ONE TOUCH ULTRA TEST TEST STRIP    USE ONE STRIP TO CHECK GLUCOSE THREE TIMES DAILY   PREDNISONE (DELTASONE) 20 MG TABLET    Take 2 tablets (40 mg total) by mouth daily with breakfast.   PROAIR HFA 108 (90 BASE) MCG/ACT INHALER    INHALE TWO PUFFS BY MOUTH EVERY 6 HOURS AS NEEDED FOR SHORTNESS OF BREATH   TERBINAFINE (LAMISIL) 1 % CREAM    Apply 1 application topically daily as needed (foot).    TRADJENTA 5 MG TABS TABLET    TAKE ONE TABLET BY MOUTH ONCE DAILY  Modified Medications   No medications on file  Discontinued Medications   No medications on file    Review of Systems  Respiratory: Positive for cough and wheezing. Negative for chest tightness and shortness of breath.   Cardiovascular: Positive for leg swelling. Negative for chest pain.  All other systems reviewed and are negative.   Vitals:   09/16/15 0830  BP: (!) 148/80  Pulse: 75    Temp: 98.1 F (36.7 C)  TempSrc: Oral  SpO2: 98%  Weight: 230 lb (104.3 kg)  Height: 6\' 1"  (1.854 m)   Body mass index is 30.34 kg/m.  Physical Exam  Constitutional: He is oriented to person, place, and time. He appears well-developed and well-nourished.  HENT:  Mouth/Throat: Oropharynx is clear and moist.  Eyes: Pupils are equal, round, and reactive to light. No scleral icterus.  Neck: Neck supple. Carotid bruit is not present. No thyromegaly present.  Cardiovascular: Normal rate, regular rhythm and intact distal pulses.  Exam reveals no gallop and no friction rub.   Murmur (1/6 SEM) heard. +1 pitting LE edema b/l. No calf TTP  Pulmonary/Chest: Effort normal. He has wheezes (b/l end expiratory with prolonged expiratory phase). He has no rales. He exhibits no tenderness.  Abdominal: Soft. Bowel sounds are normal. He exhibits no distension, no abdominal bruit, no pulsatile midline mass and no mass. There is no tenderness. There is no rebound and no guarding.  Musculoskeletal: He exhibits edema.  Lymphadenopathy:    He has no cervical adenopathy.  Neurological: He is alert and oriented to person, place, and time. He has normal reflexes.  Skin: Skin is warm and dry. No rash noted.  Psychiatric: He has a normal mood and affect. His behavior is normal. Judgment and thought content normal.  Labs reviewed: No visits with results within 3 Month(s) from this visit.  Latest known visit with results is:  Appointment on 06/01/2015  Component Date Value Ref Range Status  . Glucose 06/02/2015 59* 65 - 99 mg/dL Final  . BUN 06/02/2015 36* 8 - 27 mg/dL Final  . Creatinine, Ser 06/02/2015 1.70* 0.76 - 1.27 mg/dL Final  . GFR calc non Af Amer 06/02/2015 41* >59 mL/min/1.73 Final  . GFR calc Af Amer 06/02/2015 47* >59 mL/min/1.73 Final  . BUN/Creatinine Ratio 06/02/2015 21  10 - 24 Final  . Sodium 06/02/2015 142  134 - 144 mmol/L Final  . Potassium 06/02/2015 4.8  3.5 - 5.2 mmol/L Final   . Chloride 06/02/2015 101  96 - 106 mmol/L Final  . CO2 06/02/2015 25  18 - 29 mmol/L Final  . Calcium 06/02/2015 9.8  8.6 - 10.2 mg/dL Final  . ALT 06/02/2015 18  0 - 44 IU/L Final  . Hgb A1c MFr Bld 06/02/2015 8.1* 4.8 - 5.6 % Final   Comment:          Pre-diabetes: 5.7 - 6.4          Diabetes: >6.4          Glycemic control for adults with diabetes: <7.0   . Est. average glucose Bld gHb Est-m* 06/02/2015 186  mg/dL Final  . Cholesterol, Total 06/02/2015 149  100 - 199 mg/dL Final  . Triglycerides 06/02/2015 100  0 - 149 mg/dL Final  . HDL 06/02/2015 45  >39 mg/dL Final  . VLDL Cholesterol Cal 06/02/2015 20  5 - 40 mg/dL Final  . LDL Calculated 06/02/2015 84  0 - 99 mg/dL Final  . Chol/HDL Ratio 06/02/2015 3.3  0.0 - 5.0 ratio units Final   Comment:                                   T. Chol/HDL Ratio                                             Men  Women                               1/2 Avg.Risk  3.4    3.3                                   Avg.Risk  5.0    4.4                                2X Avg.Risk  9.6    7.1                                3X Avg.Risk 23.4   11.0     No results found.   Assessment/Plan   ICD-9-CM ICD-10-CM   1. Asthma, chronic, mild intermittent, with acute exacerbation 493.92 J45.21 predniSONE (DELTASONE) 10 MG tablet  2. Uncontrolled type 2 diabetes mellitus with diabetic nephropathy, without long-term current use of insulin (HCC) 250.42 E11.21 Microalbumin/Creatinine Ratio, Urine   583.81  E11.65   3. Essential hypertension 401.9 I10   4. Hyperlipidemia with target LDL less than 100 272.4 E78.5   5. CKD (chronic kidney disease) stage 3, GFR 30-59 ml/min 585.3 N18.3   6. S/P CABG x 4 - he completed cardiac rehab V45.81 Z95.1    DO NOT START PREDNISONE TAPER UNTIL MONDAY 09/19/15 - he will take 30mg  x 3 days -->20mg -->10mg  -->stop  Continue other medications as ordered  Will call with lab results  Continue diet and exercise program as  tolerated  Follow up with specialists as scheduled  Follow up in 3 mos for [place]. Perlie Gold  [facility] [address] [place], [place] [address] [phone] Cell (Monday-Friday 8 AM - 5 PM) [phone] After 5 PM and follow prompts

## 2015-09-17 LAB — MICROALBUMIN / CREATININE URINE RATIO
CREATININE, URINE: 60 mg/dL (ref 20–370)
MICROALB UR: 72.1 mg/dL
Microalb Creat Ratio: 1202 mcg/mg creat — ABNORMAL HIGH (ref <30)

## 2015-09-18 NOTE — Progress Notes (Signed)
Subjective:    Patient ID: Henry Carroll, male    DOB: 1951/01/03, 65 y.o.   MRN: [id number]   Chief Complaint  Patient presents with  . Follow-up    asthma/wheezing    HPI  Henry Carroll is a delightful 65 yo gentleman here for a 24 hr f/u from his acute asthma exacerbation for which Henry Abbott, PA-C saw him yesterday. I was also able to examine pt yesterday as well.  Pt was given SoluMedrol 62.5 mg IM x 1 yesteray - 20 hrs prior.   Overall his symptoms are unchanged, maybe a little better.  Reports he has responded well to oral pred tapers prior.  Has not needed the sliding scale Novolog he was rx'ed yest yet though cbg this a.m. In low 200s was much wose than his usu 90-100s fasting. Did increase his levemir as instructed yet.  Past Medical History:  Diagnosis Date  . Anemia   . Anxiety   . 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 Asthma   . Chronic combined systolic (congestive) and diastolic (congestive) heart failure ([place])    a. 12/31/14: 2D ECHO: EF 40-45%, HK of inf myocardium, G1DD, mod MR  . CKD (chronic kidney disease), stage III   . Coronary artery disease    a. LHC 01/2015 - triple vessel CAD (mod oLM, mLAD, severe mRCA, intermediate branch stenosis, CTO of mCx). Plan CABG 02/2015.  . Diabetes mellitus   . Hyperlipidemia   . Hypertension   . Mitral regurgitation    a. Mild-mod by echo 12/2014.  Marland Kitchen Myocardial infarction [facility])    pt. states per [name] he has in the past.  . NSVT (nonsustained ventricular tachycardia) (HCC)    a. 9 beats during 01/2015 adm. BB titrated.  . Obesity    a. BMI 33   Past Surgical History:  Procedure Laterality Date  . CARDIAC CATHETERIZATION N/A 02/09/2015   Procedure: Left Heart Cath and Coronary Angiography;  Surgeon: Burnell Blanks, MD;  Location: [place] [facility];  Service: Cardiovascular;  Laterality: N/A;  . COLON SURGERY  2011   Colonoscopy  . CORONARY ARTERY BYPASS GRAFT N/A 02/18/2015   Procedure: CORONARY ARTERY  BYPASS GRAFTING (CABG) x  four, using bilateral internal mammary arteries and right leg greater saphenous vein harvested endoscopically;  Surgeon: [name], MD;  Location: [place] OR;  Service: Open Heart Surgery;  Laterality: N/A;  . HERNIA REPAIR  0000000   Umbilical  . SPINE SURGERY  2006   L4 & L5  . TEE WITHOUT CARDIOVERSION N/A 02/18/2015   Procedure: TRANSESOPHAGEAL ECHOCARDIOGRAM (TEE);  Surgeon: [name], MD;  Location: [place];  Service: Open Heart Surgery;  Laterality: N/A;  . TONSILLECTOMY  1962   Current Outpatient Prescriptions on File Prior to Visit  Medication Sig Dispense Refill  . acetaminophen (TYLENOL) 500 MG tablet Take 1,000 mg by mouth 2 (two) times daily as needed for moderate pain.    Marland Kitchen albuterol (PROVENTIL) (2.5 MG/3ML) 0.083% nebulizer solution USE ONE VIAL (3 ML) IN NEBULIZER EVERY 6 HOURS AS NEEDED FOR WHEEZING OR SHORTNESS OF BREATH 300 mL 11  . amLODipine (NORVASC) 10 MG tablet Take 1 tablet (10 mg total) by mouth daily. 30 tablet 6  . aspirin EC 325 MG EC tablet Take 1 tablet (325 mg total) by mouth daily.    Marland Kitchen atorvastatin (LIPITOR) 40 MG tablet Take 1 tablet (40 mg total) by mouth every evening. 90 tablet 3  .  benazepril (LOTENSIN) 20 MG tablet Take 20 mg by mouth daily.     . carvedilol (COREG) 12.5 MG tablet Take 1 tablet (12.5 mg total) by mouth 2 (two) times daily with a meal. 60 tablet 6  . Cholecalciferol (CVS VITAMIN D3) 1000 UNITS capsule Take 2 capsules (2,000 Units total) by mouth daily. 60 capsule 3  . CINNAMON PO Take 1 tablet by mouth daily.    . Coenzyme Q10 (CO Q 10 PO) Take 1 tablet by mouth daily.    . Fluticasone-Salmeterol (ADVAIR DISKUS) 100-50 MCG/DOSE AEPB Inhale 1 puff into the lungs 2 (two) times daily. 60 each 0  . furosemide (LASIX) 20 MG tablet Take 20 mg by mouth 2 (two) times daily.    . hydrALAZINE (APRESOLINE) 25 MG tablet Take 25 mg by mouth 2 (two) times daily.    . insulin aspart (NOVOLOG) 100 UNIT/ML FlexPen Take 5 units  above 250, 10 units above 300. 15 mL 0  . insulin detemir (LEVEMIR) 100 UNIT/ML injection Inject 45 Units into the skin at bedtime.    Marland Kitchen ipratropium (ATROVENT) 0.02 % nebulizer solution Take 2.5 mLs (0.5 mg total) by nebulization 4 (four) times daily. 150 mL 0  . linagliptin (TRADJENTA) 5 MG TABS tablet Take 5 mg by mouth daily. Reported on 06/03/2015    . montelukast (SINGULAIR) 10 MG tablet Take 10 mg by mouth at bedtime.    . ONE TOUCH ULTRA TEST test strip USE ONE STRIP TO CHECK GLUCOSE THREE TIMES DAILY 100 each 11  . PROAIR HFA 108 (90 Base) MCG/ACT inhaler INHALE TWO PUFFS BY MOUTH EVERY 6 HOURS AS NEEDED FOR SHORTNESS OF BREATH 27 each 2  . terbinafine (LAMISIL) 1 % cream Apply 1 application topically daily as needed (foot).     . TRADJENTA 5 MG TABS tablet TAKE ONE TABLET BY MOUTH ONCE DAILY 30 tablet 6   No current facility-administered medications on file prior to visit.    No Known Allergies Family History  Problem Relation Age of Onset  . Diabetes Mother   . Heart disease Mother     later in life, age >74  . Heart disease Father     heart failure later in life  . Hypertension Father   . Stroke Sister   . Kidney disease Daughter   . Sudden death Daughter   . Heart attack Neg Hx    Social History   Social History  . Marital status: Married    Spouse name: N/A  . Number of children: N/A  . Years of education: N/A   Social History Main Topics  . Smoking status: Former Smoker    Types: Cigars    Quit date: 12/31/2014  . Smokeless tobacco: Never Used  . Alcohol use 0.0 oz/week     Comment: socailly  . Drug use: No  . Sexual activity: Yes    Birth control/ protection: None   Other Topics Concern  . None   Social History Narrative  . None     Review of Systems  Constitutional: Positive for activity change, appetite change and fatigue. Negative for chills and fever.  HENT: Negative for congestion, ear discharge, ear pain, postnasal drip, rhinorrhea, sinus  pressure, sneezing, sore throat and trouble swallowing.   Respiratory: Positive for cough, chest tightness, shortness of breath and wheezing.   Cardiovascular: Negative for chest pain.  Gastrointestinal: Negative for abdominal pain, constipation, diarrhea, nausea and vomiting.  Genitourinary: Negative for difficulty urinating and dysuria.  Musculoskeletal: Positive  for arthralgias. Negative for myalgias, neck pain and neck stiffness.  Hematological: Negative for adenopathy.  Psychiatric/Behavioral: Positive for sleep disturbance.       Objective:   Physical Exam  Constitutional: He is oriented to person, place, and time. He appears well-developed and well-nourished. No distress.  HENT:  Head: Normocephalic and atraumatic.  Eyes: Conjunctivae are normal. Pupils are equal, round, and reactive to light. No scleral icterus.  Neck: Normal range of motion. Neck supple. No thyromegaly present.  Cardiovascular: Normal rate, regular rhythm, normal heart sounds and intact distal pulses.   Pulmonary/Chest: Effort normal. No respiratory distress. He has wheezes. He has no rales.  Musculoskeletal: He exhibits no edema.  Lymphadenopathy:    He has no cervical adenopathy.  Neurological: He is alert and oriented to person, place, and time.  Skin: Skin is warm and dry. He is not diaphoretic.  Psychiatric: He has a normal mood and affect. His behavior is normal.      BP 140/82   Pulse 80   Temp 98.7 F (37.1 C) (Oral)   Resp 18   Ht 6' (1.829 m)   Wt 229 lb (103.9 kg)   SpO2 99%   BMI 31.06 kg/m      Assessment & Plan:   1. Asthma with acute exacerbation, moderate persistent - min improvement in sxs, mod improvement in exam since yest when he was treated with solumedrol 62.5 IM.  Has not needed neb since last night in office.  Has not been able to pick up the symbicort nebs yet - expensive and pharmacy wouldn't dispense due to question about the accuracy of the sig.  No need to start  symbicort now since we are going to treat with pred burst.   RTC immed for CXR or to ER if sxs worsen.  Start sched qid duonebs and zpack.   2.  Type 2 IDDM - cont increased dose of levemir bid while on pred taper.  Start novolog ssi prior to meals.  Meds ordered this encounter  Medications  . predniSONE (DELTASONE) 20 MG tablet    Sig: Take 2 tablets (40 mg total) by mouth daily with breakfast.    Dispense:  10 tablet    Refill:  0  . azithromycin (ZITHROMAX) 250 MG tablet    Sig: Take 2 tabs PO x 1 dose, then 1 tab PO QD x 4 days    Dispense:  6 tablet    Refill:  0  . albuterol (PROVENTIL) (2.5 MG/3ML) 0.083% nebulizer solution 2.5 mg  . ipratropium (ATROVENT) nebulizer solution 0.5 mg    Delman Cheadle, M.D.  [facility] [address] [place], [place] [address] [phone] phone [phone] fax  09/18/15 11:48 PM

## 2015-09-21 ENCOUNTER — Other Ambulatory Visit: Payer: Self-pay | Admitting: Internal Medicine

## 2015-10-08 ENCOUNTER — Other Ambulatory Visit: Payer: Self-pay | Admitting: Internal Medicine

## 2015-10-10 ENCOUNTER — Telehealth: Payer: Self-pay | Admitting: Cardiology

## 2015-10-10 NOTE — Telephone Encounter (Signed)
I spoke with the pt and made him aware that Dr Marlou Porch agreed with increased dose of Furosemide today.  The pt will resume normal dosage tomorrow and contact the office if he continues to have swelling, SOB and weight gain. Pt agreed with plan.

## 2015-10-10 NOTE — Telephone Encounter (Signed)
Agree with plan. Thanks Mark Skains, MD  

## 2015-10-10 NOTE — Telephone Encounter (Signed)
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  The pt was up on his feet more than normal in the heat. He also ate prepared foods at the event (hot dogs, sandwiches).  The pt notes "puffiness" in his feet and ankles, weight gain of 7 lbs and SOB with exertion.  The pt said he played golf yesterday and did experience SOB a few times.  Pt denies CP. The pt states that he ran out of furosemide on Saturday and will be going to the pharmacy today to pick up his prescription.  The pt normally takes Furosemide 20mg  twice a day.  I advised him to take 40mg  twice today and then we will advise on further recommendations from Dr Marlou Porch. Pt agreed with plan.

## 2015-10-10 NOTE — Telephone Encounter (Signed)
New message   Pt c/o swelling: STAT is pt has developed SOB within 24 hours  1. How long have you been experiencing swelling?  5 days 2. Where is the swelling located? ankles and feet 3.  Are you currently taking a "fluid pill"? yes  4.  Are you currently SOB? Headaches  5.  Have you traveled recently? no

## 2015-10-19 ENCOUNTER — Other Ambulatory Visit: Payer: Self-pay | Admitting: Physician Assistant

## 2015-10-28 ENCOUNTER — Telehealth: Payer: Self-pay | Admitting: *Deleted

## 2015-10-28 MED ORDER — FUROSEMIDE 40 MG PO TABS: 40.0000 mg | ORAL_TABLET | Freq: Two times a day (BID) | ORAL | 6 refills | Status: DC

## 2015-10-28 NOTE — Telephone Encounter (Signed)
That would be fine, 40 BID lasix  Henry Furbish, MD

## 2015-10-28 NOTE — Telephone Encounter (Signed)
Returned call to patient.He stated he wanted to ask Dr.Skains if he can increase lasix to 40 mg twice a day.Stated 40 mg keeps swelling down.Stated he has been eating too much soup.Advised he needs to follow low salt diet.Advised Dr.Skains out of office this afternoon.I will send message to him for advice.

## 2015-10-28 NOTE — Telephone Encounter (Signed)
Returned call to patient.Dr.Skains advised ok increase lasix to 40 mg twice a day.Prescription sent to pharmacy

## 2015-10-28 NOTE — Telephone Encounter (Signed)
Patient called and stated he was instructed by Dr Marlou Porch to take furosemide 40 mg bid once on 10/10/15 and then resume his normal dose of 20 mg bid. He states that he feels that he needs to continue the 40 mg bid. He requested a new rx be sent to [facility] in [place]. He can be reached at [phone]. Please advise. Thanks, MI

## 2015-11-01 ENCOUNTER — Other Ambulatory Visit: Payer: Self-pay | Admitting: Internal Medicine

## 2015-12-05 ENCOUNTER — Ambulatory Visit (INDEPENDENT_AMBULATORY_CARE_PROVIDER_SITE_OTHER): Payer: Medicare Other

## 2015-12-05 ENCOUNTER — Ambulatory Visit (INDEPENDENT_AMBULATORY_CARE_PROVIDER_SITE_OTHER): Payer: Medicare Other | Admitting: Physician Assistant

## 2015-12-05 VITALS — BP 148/80 | HR 76 | Temp 97.9°F | Resp 16 | Ht 73.0 in | Wt 248.4 lb

## 2015-12-05 DIAGNOSIS — M25552 Pain in left hip: Secondary | ICD-10-CM | POA: Diagnosis not present

## 2015-12-05 DIAGNOSIS — I255 Ischemic cardiomyopathy: Secondary | ICD-10-CM

## 2015-12-05 DIAGNOSIS — Z23 Encounter for immunization: Secondary | ICD-10-CM

## 2015-12-05 NOTE — Progress Notes (Signed)
Patient ID: Henry Carroll, male    DOB: 06-02-1950, 65 y.o.   MRN: [id number]  PCP: Gildardo Cranker, DO  Subjective:   Chief Complaint  Patient presents with  . Hip Pain    left x 3 days    HPI Presents for evaluation of LEFT hip pain x 3 days.   No injury recalled. No previous pain like this. Initially, "unbearable" 8/10. After acetaminophen 3/10 A couple of weeks ago he had some pain in the RIGHT hip that altered his gait for a few days, but then resolved. He cared for his grandchildren (ages 104, 70 and 4 months) the day the pain started, and notes that he carried the infant on the LEFT side.  No weakness in the leg, no giving way. No pain in the low back. No loss of bowel/bladder control. No paresthesias. No saddle anesthesia.    Review of Systems As above.    Patient Active Problem List   Diagnosis Date Noted  . PAD (peripheral artery disease) ([place]) 03/02/2015  . Chronic systolic heart failure ([place]) 02/25/2015  . Hypertensive heart disease with heart failure ([place]) 02/25/2015  . CAD (coronary artery disease) 02/18/2015  . CAD in native artery 02/10/2015  . Abnormal stress test 02/10/2015  . Mitral regurgitation 02/10/2015  . Chronic combined systolic and diastolic CHF (congestive heart failure) ([place]) 02/10/2015  . NSVT (nonsustained ventricular tachycardia) ([place]) 02/10/2015  . Acute on chronic systolic and diastolic heart failure, NYHA class 1 ([place]) 12/31/2014  . HLD (hyperlipidemia) 12/31/2014  . Demand ischemia ([place]) 12/31/2014  . Morbid obesity ([place]) 12/29/2014  . Elevated troponin   . Essential hypertension 10/09/2013  . DM type 2, uncontrolled, with renal complications ([place]) 44/96/7591  . Obesity (BMI 30-39.9) 05/05/2013  . CKD (chronic kidney disease) stage 3, GFR 30-59 ml/min 02/18/2013  . Folliculitis of perineum 10/01/2012  . Anemia in chronic kidney disease 09/03/2012  . Other malaise and fatigue 09/03/2012  . Other testicular hypofunction  09/03/2012  . Asthma, chronic 06/04/2012  . COLONIC POLYPS, HX OF 04/13/2009     Prior to Admission medications   Medication Sig Start Date End Date Taking? Authorizing Provider  acetaminophen (TYLENOL) 500 MG tablet Take 1,000 mg by mouth 2 (two) times daily as needed for moderate pain.   Yes Historical Provider, MD  ADVAIR DISKUS 100-50 MCG/DOSE AEPB INHALE ONE DOSE BY MOUTH TWICE DAILY 11/02/15  Yes Gildardo Cranker, DO  albuterol (PROVENTIL) (2.5 MG/3ML) 0.083% nebulizer solution USE ONE VIAL (3 ML) IN NEBULIZER EVERY 6 HOURS AS NEEDED FOR WHEEZING OR SHORTNESS OF BREATH 04/13/15  Yes Gildardo Cranker, DO  amLODipine (NORVASC) 10 MG tablet Take 1 tablet (10 mg total) by mouth daily. 12/01/14  Yes Monica Carter, DO  amLODipine (NORVASC) 10 MG tablet TAKE ONE TABLET BY MOUTH ONCE DAILY 09/22/15  Yes Gildardo Cranker, DO  aspirin EC 325 MG EC tablet Take 1 tablet (325 mg total) by mouth daily. 02/22/15  Yes Donielle Liston Alba, PA-C  atorvastatin (LIPITOR) 40 MG tablet Take 1 tablet (40 mg total) by mouth every evening. 08/08/15  Yes Jerline Pain, MD  benazepril (LOTENSIN) 20 MG tablet Take 20 mg by mouth daily.    Yes Historical Provider, MD  benazepril (LOTENSIN) 40 MG tablet TAKE ONE TABLET BY MOUTH ONCE DAILY FOR BLOOD PRESSURE 10/10/15  Yes Gildardo Cranker, DO  carvedilol (COREG) 12.5 MG tablet TAKE ONE TABLET BY MOUTH TWICE DAILY WITH A MEAL 10/19/15  Yes Jerline Pain, MD  Cholecalciferol ([employer] VITAMIN D3) 1000 UNITS capsule Take 2 capsules (2,000 Units total) by mouth daily. 10/07/13  Yes Mahima Pandey, MD  CINNAMON PO Take 1 tablet by mouth daily.   Yes Historical Provider, MD  Coenzyme Q10 (CO Q 10 PO) Take 1 tablet by mouth daily.   Yes Historical Provider, MD  Fluticasone-Salmeterol (ADVAIR DISKUS) 100-50 MCG/DOSE AEPB Inhale 1 puff into the lungs 2 (two) times daily. 09/07/15  Yes Gildardo Cranker, DO  furosemide (LASIX) 40 MG tablet Take 1 tablet (40 mg total) by mouth 2 (two) times daily. 10/28/15  01/26/16 Yes Jerline Pain, MD  hydrALAZINE (APRESOLINE) 25 MG tablet Take 25 mg by mouth 2 (two) times daily. 05/08/15  Yes Historical Provider, MD  insulin aspart (NOVOLOG) 100 UNIT/ML FlexPen Take 5 units above 250, 10 units above 300. 09/13/15  Yes Tereasa Coop, PA-C  insulin detemir (LEVEMIR) 100 UNIT/ML injection Inject 45 Units into the skin at bedtime.   Yes Historical Provider, MD  linagliptin (TRADJENTA) 5 MG TABS tablet Take 5 mg by mouth daily. Reported on 06/03/2015   Yes Historical Provider, MD  montelukast (SINGULAIR) 10 MG tablet Take 10 mg by mouth at bedtime.   Yes Historical Provider, MD  ONE TOUCH ULTRA TEST test strip USE ONE STRIP TO CHECK GLUCOSE THREE TIMES DAILY 06/14/15  Yes Monica Austell, DO  PROAIR HFA 108 (90 Base) MCG/ACT inhaler INHALE TWO PUFFS BY MOUTH EVERY 6 HOURS AS NEEDED FOR SHORTNESS OF BREATH 08/10/15  Yes Gildardo Cranker, DO  terbinafine (LAMISIL) 1 % cream Apply 1 application topically daily as needed (foot).    Yes Historical Provider, MD  TRADJENTA 5 MG TABS tablet TAKE ONE TABLET BY MOUTH ONCE DAILY 09/08/15  Yes Gildardo Cranker, DO     No Known Allergies     Objective:  Physical Exam  Constitutional: He is oriented to person, place, and time. He appears well-developed and well-nourished. He is active and cooperative. No distress.  BP (!) 148/80   Pulse 76   Temp 97.9 F (36.6 C) (Oral)   Resp 16   Ht 6\' 1"  (1.854 m)   Wt 248 lb 6.4 oz (112.7 kg)   SpO2 98%   BMI 32.77 kg/m   HENT:  Head: Normocephalic and atraumatic.  Right Ear: Hearing normal.  Left Ear: Hearing normal.  Eyes: Conjunctivae are normal. No scleral icterus.  Neck: Normal range of motion. Neck supple. No thyromegaly present.  Cardiovascular: Normal rate, regular rhythm and normal heart sounds.   Pulses:      Radial pulses are 2+ on the right side, and 2+ on the left side.       Posterior tibial pulses are 1+ on the right side, and 1+ on the left side.  Pulmonary/Chest: Effort  normal and breath sounds normal.  Musculoskeletal:       Left hip: He exhibits decreased strength (reduced flexion strength due to pain). He exhibits normal range of motion, no tenderness, no bony tenderness, no swelling, no crepitus, no deformity and no laceration.       Left knee: Normal.       Left upper leg: He exhibits no tenderness, no bony tenderness, no swelling, no edema, no deformity and no laceration.       Right lower leg: He exhibits edema (1+).       Left lower leg: He exhibits edema (1+).       Legs: Lymphadenopathy:       Head (right side): No  tonsillar, no preauricular, no posterior auricular and no occipital adenopathy present.       Head (left side): No tonsillar, no preauricular, no posterior auricular and no occipital adenopathy present.    He has no cervical adenopathy.       Right: No supraclavicular adenopathy present.       Left: No supraclavicular adenopathy present.  Neurological: He is alert and oriented to person, place, and time. No sensory deficit.  Skin: Skin is warm, dry and intact. No rash noted. No cyanosis or erythema. Nails show no clubbing.  Psychiatric: He has a normal mood and affect. His speech is normal and behavior is normal.       Dg Hip Unilat W Or W/o Pelvis 2-3 Views Left  Result Date: 12/05/2015 CLINICAL DATA:  Left hip pain for 3 days.  No trauma. EXAM: DG HIP (WITH OR WITHOUT PELVIS) 2-3V LEFT COMPARISON:  None. FINDINGS: AP view pelvis and two views of the left hip. AP view pelvis demonstrates lower lumbar and lumbosacral spondylosis. Femoral heads are located. No acute fracture. Vascular calcifications. Joint spaces are maintained for age. IMPRESSION: No acute osseous abnormality. Lower lumbar spondylosis. Advanced for age vascular calcifications. Electronically Signed   By: Abigail Miyamoto M.D.   On: 12/05/2015 11:02       Assessment & Plan:   1. Pain of left hip joint Likely soft tissue irritation/inflammation due to altered gait.  Continue with acetaminophen, as before. Rest, but some activity is ok. He can advance his activity as tolerated. RTC if symptoms worsen/persist, or follow-up with PCP. - DG HIP UNILAT W OR W/O PELVIS 2-3 VIEWS LEFT; Future  2. Need for influenza vaccination - Flu Vaccine QUAD 36+ mos IM   Fara Chute, PA-C Physician Assistant-Certified [facility]

## 2015-12-05 NOTE — Patient Instructions (Addendum)
Continue the acetaminophen and heat/ice application. I suspect that the pain a few weeks ago in the OTHER hip and the extra activity with your grandchildren have caused some irritation in the soft tissues of the hip, which we can't see on the radiographs. If the symptoms persist, please follow-up with Dr. Eulas Post for additional evaluation/treatment.    IF you received an x-ray today, you will receive an invoice from [facility]. Please contact [facility] at [phone] with questions or concerns regarding your invoice.   IF you received labwork today, you will receive an invoice from [employer]. Please contact [facility] at [phone] with questions or concerns regarding your invoice.   Our billing staff will not be able to assist you with questions regarding bills from these companies.  You will be contacted with the lab results as soon as they are available. The fastest way to get your results is to activate your My Chart account. Instructions are located on the last page of this paperwork. If you have not heard from [place] regarding the results in 2 weeks, please contact this office.

## 2015-12-06 ENCOUNTER — Telehealth: Payer: Self-pay | Admitting: Internal Medicine

## 2015-12-06 NOTE — Telephone Encounter (Signed)
left msg asking if pt can come at 9:00 on 11/8 to meet with nurse first for AWV. VDM (dee-dee)

## 2015-12-14 ENCOUNTER — Other Ambulatory Visit: Payer: Self-pay | Admitting: *Deleted

## 2015-12-14 MED ORDER — ALBUTEROL SULFATE (2.5 MG/3ML) 0.083% IN NEBU: INHALATION_SOLUTION | RESPIRATORY_TRACT | 11 refills | Status: DC

## 2015-12-14 NOTE — Telephone Encounter (Signed)
[facility] needed Dx added to Rx.

## 2015-12-16 NOTE — Telephone Encounter (Signed)
Spoke w/ pt and caregiver and advised to just come at 9:45 on 40/7 (time conflict w/ Alisa's schedule). Caregiver also stated concern about moles on pt's back because they are increasing in size and wants to know if he should see a dermatologist. VDM (DD)

## 2015-12-19 ENCOUNTER — Other Ambulatory Visit: Payer: Self-pay | Admitting: Internal Medicine

## 2015-12-19 ENCOUNTER — Other Ambulatory Visit: Payer: Federal, State, Local not specified - PPO

## 2015-12-19 DIAGNOSIS — N183 Chronic kidney disease, stage 3 unspecified: Secondary | ICD-10-CM

## 2015-12-19 DIAGNOSIS — IMO0002 Reserved for concepts with insufficient information to code with codable children: Secondary | ICD-10-CM

## 2015-12-19 DIAGNOSIS — N400 Enlarged prostate without lower urinary tract symptoms: Secondary | ICD-10-CM | POA: Diagnosis not present

## 2015-12-19 DIAGNOSIS — E1121 Type 2 diabetes mellitus with diabetic nephropathy: Secondary | ICD-10-CM | POA: Diagnosis not present

## 2015-12-19 DIAGNOSIS — E7849 Other hyperlipidemia: Secondary | ICD-10-CM

## 2015-12-19 DIAGNOSIS — E1165 Type 2 diabetes mellitus with hyperglycemia: Secondary | ICD-10-CM

## 2015-12-19 DIAGNOSIS — E784 Other hyperlipidemia: Secondary | ICD-10-CM | POA: Diagnosis not present

## 2015-12-19 LAB — BASIC METABOLIC PANEL WITH GFR
BUN: 40 mg/dL — AB (ref 7–25)
CHLORIDE: 105 mmol/L (ref 98–110)
CO2: 25 mmol/L (ref 20–31)
Calcium: 9 mg/dL (ref 8.6–10.3)
Creat: 2.11 mg/dL — ABNORMAL HIGH (ref 0.70–1.25)
GFR, EST AFRICAN AMERICAN: 36 mL/min — AB (ref >=60)
GFR, EST NON AFRICAN AMERICAN: 31 mL/min — AB (ref >=60)
GLUCOSE: 56 mg/dL — AB (ref 65–99)
POTASSIUM: 4.9 mmol/L (ref 3.5–5.3)
Sodium: 141 mmol/L (ref 135–146)

## 2015-12-19 LAB — TSH: TSH: 0.94 m[IU]/L (ref 0.40–4.50)

## 2015-12-19 LAB — COMPLETE METABOLIC PANEL WITH GFR
ALT: 19 U/L (ref 9–46)
AST: 22 U/L (ref 10–35)
Albumin: 3.2 g/dL — ABNORMAL LOW (ref 3.6–5.1)
Alkaline Phosphatase: 61 U/L (ref 40–115)
BUN: 40 mg/dL — AB (ref 7–25)
CHLORIDE: 105 mmol/L (ref 98–110)
CO2: 24 mmol/L (ref 20–31)
Calcium: 9 mg/dL (ref 8.6–10.3)
Creat: 2.1 mg/dL — ABNORMAL HIGH (ref 0.70–1.25)
GFR, EST NON AFRICAN AMERICAN: 32 mL/min — AB (ref >=60)
GFR, Est African American: 37 mL/min — ABNORMAL LOW (ref >=60)
GLUCOSE: 55 mg/dL — AB (ref 65–99)
POTASSIUM: 4.9 mmol/L (ref 3.5–5.3)
Sodium: 140 mmol/L (ref 135–146)
Total Bilirubin: 0.2 mg/dL (ref 0.2–1.2)
Total Protein: 5.7 g/dL — ABNORMAL LOW (ref 6.1–8.1)

## 2015-12-19 LAB — LIPID PANEL
CHOL/HDL RATIO: 3.1 ratio (ref <5.0)
CHOLESTEROL: 153 mg/dL (ref <200)
HDL: 49 mg/dL (ref >40)
LDL Cholesterol: 78 mg/dL
Triglycerides: 128 mg/dL (ref <150)
VLDL: 26 mg/dL (ref <30)

## 2015-12-20 LAB — HEMOGLOBIN A1C
Hgb A1c MFr Bld: 7.4 % — ABNORMAL HIGH (ref <5.7)
Mean Plasma Glucose: 166 mg/dL

## 2015-12-21 ENCOUNTER — Encounter: Payer: Self-pay | Admitting: Internal Medicine

## 2015-12-21 ENCOUNTER — Ambulatory Visit (INDEPENDENT_AMBULATORY_CARE_PROVIDER_SITE_OTHER): Payer: Medicare Other | Admitting: Internal Medicine

## 2015-12-21 VITALS — BP 148/78 | HR 87 | Temp 98.2°F | Ht 73.0 in | Wt 240.8 lb

## 2015-12-21 DIAGNOSIS — IMO0002 Reserved for concepts with insufficient information to code with codable children: Secondary | ICD-10-CM

## 2015-12-21 DIAGNOSIS — N183 Chronic kidney disease, stage 3 unspecified: Secondary | ICD-10-CM

## 2015-12-21 DIAGNOSIS — I10 Essential (primary) hypertension: Secondary | ICD-10-CM

## 2015-12-21 DIAGNOSIS — Z0000 Encounter for general adult medical examination without abnormal findings: Secondary | ICD-10-CM | POA: Diagnosis not present

## 2015-12-21 DIAGNOSIS — R195 Other fecal abnormalities: Secondary | ICD-10-CM | POA: Diagnosis not present

## 2015-12-21 DIAGNOSIS — I5042 Chronic combined systolic (congestive) and diastolic (congestive) heart failure: Secondary | ICD-10-CM

## 2015-12-21 DIAGNOSIS — B356 Tinea cruris: Secondary | ICD-10-CM

## 2015-12-21 DIAGNOSIS — J4521 Mild intermittent asthma with (acute) exacerbation: Secondary | ICD-10-CM

## 2015-12-21 DIAGNOSIS — E785 Hyperlipidemia, unspecified: Secondary | ICD-10-CM

## 2015-12-21 DIAGNOSIS — R600 Localized edema: Secondary | ICD-10-CM | POA: Diagnosis not present

## 2015-12-21 DIAGNOSIS — E1165 Type 2 diabetes mellitus with hyperglycemia: Secondary | ICD-10-CM

## 2015-12-21 DIAGNOSIS — E1121 Type 2 diabetes mellitus with diabetic nephropathy: Secondary | ICD-10-CM

## 2015-12-21 DIAGNOSIS — I739 Peripheral vascular disease, unspecified: Secondary | ICD-10-CM

## 2015-12-21 LAB — PSA: PSA: 0.8 ng/mL (ref <=4.0)

## 2015-12-21 MED ORDER — MONTELUKAST SODIUM 10 MG PO TABS: 10.0000 mg | ORAL_TABLET | Freq: Every day | ORAL | 6 refills | Status: DC

## 2015-12-21 MED ORDER — NYSTATIN 100000 UNIT/GM EX POWD: Freq: Three times a day (TID) | CUTANEOUS | 1 refills | Status: DC

## 2015-12-21 NOTE — Progress Notes (Signed)
Patient ID: Henry Carroll, male   DOB: 20-Aug-1950, 65 y.o.   MRN: [id number]   Location:  [facility]  Place of Service:  OFFICE  Provider: Arletha Grippe, DO  Patient Care Team: Gildardo Cranker, DO as PCP - General (Internal Medicine) Jerline Pain, MD as Consulting Physician (Cardiology) Fleet Contras, MD as Consulting Physician (Nephrology)  Extended Emergency Contact Information Primary Emergency Contact: [facility] Address: [address]          [place], [place] [address] [place] Phone: [phone] Relation: Spouse  Code Status: FULL CODE Goals of Care: Advanced Directive information Advanced Directives 12/21/2015  Does patient have an advance directive? No  Type of Advance Directive -  Does patient want to make changes to advanced directive? -  Copy of advanced directive(s) in chart? -  Would patient like information on creating an advanced directive? No - patient declined information     Chief Complaint  Patient presents with  . Annual Exam    Yearly Exam  . Other    MMSE 28/30 passed clock drawing    HPI: Patient is a 65 y.o. male seen in today for an annual wellness exam.  He reports URI that started about 10 days ago while on golfing trip. He still has some hoarseness and it is improving on its own.   He saw specialist for left leg pain that resolved on its own.   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 Vein harvested from right leg. ABIs 0.62 on right and 0.28 on left. Cr 1.8 and Hgb 8.9 at d/c. BP meds adjusted. Followed by cardio Dr Marlou Porch. Released from CT surgery. Completed cardiac rehab in July 2017.  DM - BS at home 90s fasting, at night 140-150s . No low BS reactions. He stopped using novolog prior to asthma attack. No numbness or tingling. Takes tradjenta and levemir. Uses 50 units levemir BID most days. A1c 7.4%; urine microalbumin/Cr ratio 1202  HTN - stable. No muscle cramps since last OV.  Takes lasix, coreg, hydralazine, amlodipine. Takes ASA daily. Followed by cardio Dr Marlou Porch  Hyperlipidemia - stable on statin. No myalgias. LDL 78  Asthma - see above. He takes advair and albuterol neb. Takes singulair  CKD - followed by nephrology Dr Mercy Moore. Cr 2.10  His albumin was 3.2; total protein 5.7  Depression screen [facility] 2/9 12/21/2015 12/05/2015 09/14/2015 09/13/2015 08/11/2015  Decreased Interest 0 0 0 0 0  Down, Depressed, Hopeless 0 0 0 0 0  PHQ - 2 Score 0 0 0 0 0  Altered sleeping - - - - 0  Tired, decreased energy - - - - 0  Change in appetite - - - - 0  Feeling bad or failure about yourself  - - - - 0  Trouble concentrating - - - - 0  Moving slowly or fidgety/restless - - - - 0  Suicidal thoughts - - - - 0  PHQ-9 Score - - - - 0  Difficult doing work/chores - - - - Not difficult at all    Fall Risk  12/21/2015 12/05/2015 09/16/2015 09/14/2015 09/13/2015  Falls in the past year? No No No No No  Number falls in past yr: - - - - -  Injury with Fall? - - - - -  Risk for fall due to : - - - - -  Risk for fall due to (comments): - - - - -   MMSE -  Mini Mental State Exam 12/21/2015 12/01/2014 05/05/2013  Orientation to time 5 5 4   Orientation to Place 5 5 5   Registration 3 3 3   Attention/ Calculation 5 5 5   Recall 1 1 2   Language- name 2 objects 2 2 2   Language- repeat 1 1 1   Language- follow 3 step command 3 3 3   Language- read & follow direction 1 1 1   Write a sentence 1 1 1   Copy design 1 1 1   Total score 28 28 28      Health Maintenance  Topic Date Due  . Hepatitis C Screening  October 16, 1950  . ZOSTAVAX  02/22/2008  . OPHTHALMOLOGY EXAM  11/18/2014  . HEMOGLOBIN A1C  06/17/2016  . COLONOSCOPY  04/23/2019  . TETANUS/TDAP  03/25/2021  . INFLUENZA VACCINE  Completed  . PNA vac Low Risk Adult  Completed    Urinary incontinence? No issues  Functional Status Survey:    Exercise? As tolerated  Diet? Maintains healthy food choices  No exam data  present  Hearing: no issues    Dentition: followed by dentist  Pain: none  Past Medical History:  Diagnosis Date  . Anemia   . Anxiety   . 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 Asthma   . Chronic combined systolic (congestive) and diastolic (congestive) heart failure    a. 12/31/14: 2D ECHO: EF 40-45%, HK of inf myocardium, G1DD, mod MR  . CKD (chronic kidney disease), stage III   . Coronary artery disease    a. LHC 01/2015 - triple vessel CAD (mod oLM, mLAD, severe mRCA, intermediate branch stenosis, CTO of mCx). Plan CABG 02/2015.  . Diabetes mellitus   . Hyperlipidemia   . Hypertension   . Mitral regurgitation    a. Mild-mod by echo 12/2014.  Marland Kitchen Myocardial infarction    pt. states per [name] he has in the past.  . NSVT (nonsustained ventricular tachycardia) (HCC)    a. 9 beats during 01/2015 adm. BB titrated.  . Obesity    a. BMI 33    Past Surgical History:  Procedure Laterality Date  . CARDIAC CATHETERIZATION N/A 02/09/2015   Procedure: Left Heart Cath and Coronary Angiography;  Surgeon: Burnell Blanks, MD;  Location: [facility];  Service: Cardiovascular;  Laterality: N/A;  . COLON SURGERY  2011   Colonoscopy  . CORONARY ARTERY BYPASS GRAFT N/A 02/18/2015   Procedure: CORONARY ARTERY BYPASS GRAFTING (CABG) x  four, using bilateral internal mammary arteries and right leg greater saphenous vein harvested endoscopically;  Surgeon: [name], MD;  Location: [facility];  Service: Open Heart Surgery;  Laterality: N/A;  . HERNIA REPAIR  [id number]   Umbilical  . SPINE SURGERY  2006   L4 & L5  . TEE WITHOUT CARDIOVERSION N/A 02/18/2015   Procedure: TRANSESOPHAGEAL ECHOCARDIOGRAM (TEE);  Surgeon: [name], MD;  Location: [facility];  Service: Open Heart Surgery;  Laterality: N/A;  . TONSILLECTOMY  1962    Family History  Problem Relation Age of Onset  . Diabetes Mother   . Heart disease Mother     later in life, age >21  . Heart disease Father     heart failure  later in life  . Hypertension Father   . Stroke Sister   . Kidney disease Daughter   . Sudden death Daughter   . Heart attack Neg Hx    Family Status  Relation Status  . Mother Deceased at age 3  Cause of Death: Complications of diabetes  . Father Deceased at age 67   Cause of Death: Heart disease  . Sister Deceased at age 67   Cause of Death: CVA  . Brother Alive  . Daughter Alive  . Sister Alive  . Maternal Grandmother Deceased  . Maternal Grandfather Deceased  . Paternal Grandmother Deceased  . Paternal Grandfather Deceased  . Daughter   . Daughter   . Neg Hx     Social History   Social History  . Marital status: Married    Spouse name: N/A  . Number of children: N/A  . Years of education: N/A   Occupational History  . Not on file.   Social History Main Topics  . Smoking status: Former Smoker    Types: Cigars    Quit date: 12/31/2014  . Smokeless tobacco: Never Used  . Alcohol use 0.0 oz/week     Comment: socailly  . Drug use: No  . Sexual activity: Yes    Birth control/ protection: None   Other Topics Concern  . Not on file   Social History Narrative  . No narrative on file    No Known Allergies    Medication List       Accurate as of 12/21/15 10:54 AM. Always use your most recent med list.          acetaminophen 500 MG tablet Commonly known as:  TYLENOL Take 1,000 mg by mouth 2 (two) times daily as needed for moderate pain.   amLODipine 10 MG tablet Commonly known as:  NORVASC Take 1 tablet (10 mg total) by mouth daily.   amLODipine 10 MG tablet Commonly known as:  NORVASC TAKE ONE TABLET BY MOUTH ONCE DAILY   aspirin 325 MG EC tablet Take 1 tablet (325 mg total) by mouth daily.   atorvastatin 40 MG tablet Commonly known as:  LIPITOR Take 1 tablet (40 mg total) by mouth every evening.   benazepril 20 MG tablet Commonly known as:  LOTENSIN Take 20 mg by mouth daily.   benazepril 40 MG tablet Commonly known as:   LOTENSIN TAKE ONE TABLET BY MOUTH ONCE DAILY FOR BLOOD PRESSURE   carvedilol 12.5 MG tablet Commonly known as:  COREG TAKE ONE TABLET BY MOUTH TWICE DAILY WITH A MEAL   Cholecalciferol 1000 units capsule Commonly known as:  [employer] VITAMIN D3 Take 2 capsules (2,000 Units total) by mouth daily.   CINNAMON PO Take 1 tablet by mouth daily.   CO Q 10 PO Take 1 tablet by mouth daily.   Fluticasone-Salmeterol 100-50 MCG/DOSE Aepb Commonly known as:  ADVAIR DISKUS Inhale 1 puff into the lungs 2 (two) times daily.   ADVAIR DISKUS 100-50 MCG/DOSE Aepb Generic drug:  Fluticasone-Salmeterol INHALE ONE DOSE BY MOUTH TWICE DAILY   furosemide 40 MG tablet Commonly known as:  LASIX Take 1 tablet (40 mg total) by mouth 2 (two) times daily.   hydrALAZINE 25 MG tablet Commonly known as:  APRESOLINE Take 25 mg by mouth 2 (two) times daily.   insulin aspart 100 UNIT/ML FlexPen Commonly known as:  NOVOLOG Take 5 units above 250, 10 units above 300.   LEVEMIR 100 UNIT/ML injection Generic drug:  insulin detemir Inject 45 Units into the skin at bedtime.   montelukast 10 MG tablet Commonly known as:  SINGULAIR Take 10 mg by mouth at bedtime.   ONE TOUCH ULTRA TEST test strip Generic drug:  glucose blood USE ONE STRIP TO CHECK GLUCOSE THREE  TIMES DAILY   PROAIR HFA 108 (90 Base) MCG/ACT inhaler Generic drug:  albuterol INHALE TWO PUFFS BY MOUTH EVERY 6 HOURS AS NEEDED FOR SHORTNESS OF BREATH   albuterol (2.5 MG/3ML) 0.083% nebulizer solution Commonly known as:  PROVENTIL USE ONE VIAL (3 ML) IN NEBULIZER EVERY 6 HOURS AS NEEDED FOR WHEEZING OR SHORTNESS OF BREATH. Dx:J45.21, R06.2   terbinafine 1 % cream Commonly known as:  LAMISIL Apply 1 application topically daily as needed (foot).   TRADJENTA 5 MG Tabs tablet Generic drug:  linagliptin Take 5 mg by mouth daily. Reported on 06/03/2015   TRADJENTA 5 MG Tabs tablet Generic drug:  linagliptin TAKE ONE TABLET BY MOUTH ONCE DAILY         Review of Systems:  Review of Systems  Cardiovascular: Positive for leg swelling.  All other systems reviewed and are negative.   Physical Exam: Vitals:   12/21/15 0948  BP: (!) 148/78  Pulse: 87  Temp: 98.2 F (36.8 C)  TempSrc: Oral  SpO2: 96%  Weight: 240 lb 12.8 oz (109.2 kg)  Height: 6\' 1"  (1.854 m)   Body mass index is 31.77 kg/m. Physical Exam  Constitutional: He is oriented to person, place, and time. He appears well-developed and well-nourished. No distress.  HENT:  Head: Normocephalic and atraumatic.  Right Ear: Hearing, tympanic membrane, external ear and ear canal normal.  Left Ear: Hearing, tympanic membrane, external ear and ear canal normal.  Mouth/Throat: Uvula is midline, oropharynx is clear and moist and mucous membranes are normal. He does not have dentures.  Eyes: Conjunctivae, EOM and lids are normal. Pupils are equal, round, and reactive to light. No scleral icterus.  Neck: Trachea normal and normal range of motion. Neck supple. Carotid bruit is not present. No thyroid mass and no thyromegaly present.  Cardiovascular: Normal rate, regular rhythm, normal heart sounds and intact distal pulses.  Exam reveals no gallop and no friction rub.   No murmur heard. +2 pitting LE edema b/l. No calf TTP  Pulmonary/Chest: Effort normal and breath sounds normal. He has no wheezes. He has no rhonchi. He has no rales. Right breast exhibits no inverted nipple, no mass, no nipple discharge, no skin change and no tenderness. Left breast exhibits no inverted nipple, no mass, no nipple discharge, no skin change and no tenderness. Breasts are symmetrical.  Abdominal: Soft. Normal appearance, normal aorta and bowel sounds are normal. He exhibits no pulsatile midline mass and no mass. There is no hepatosplenomegaly. There is no tenderness. There is no rigidity, no rebound and no guarding. No hernia. Hernia confirmed negative in the right inguinal area and confirmed negative  in the left inguinal area.  Genitourinary: Testes normal. Rectal exam shows guaiac positive stool. Rectal exam shows no external hemorrhoid, no internal hemorrhoid, no mass and no tenderness. Prostate is enlarged (smooth firm and no palpable mass). Prostate is not tender. No penile tenderness.     Musculoskeletal: Normal range of motion. He exhibits edema and tenderness.  Lymphadenopathy:       Head (right side): No posterior auricular adenopathy present.       Head (left side): No posterior auricular adenopathy present.    He has no cervical adenopathy.       Right: No supraclavicular adenopathy present.       Left: No supraclavicular adenopathy present.  Neurological: He is alert and oriented to person, place, and time. He has normal strength and normal reflexes. No cranial nerve deficit. Gait normal.  Skin:  Skin is warm, dry and intact. Rash (b/l inguinal) noted. Nails show no clubbing.  Psychiatric: He has a normal mood and affect. His speech is normal and behavior is normal. Judgment and thought content normal. Cognition and memory are normal.   Diabetic Foot Exam - Simple   Simple Foot Form Diabetic Foot exam was performed with the following findings:  Yes 12/21/2015 11:20 AM  Visual Inspection No deformities, no ulcerations, no other skin breakdown bilaterally:  Yes Sensation Testing Intact to touch and monofilament testing bilaterally:  Yes Pulse Check Posterior Tibialis and Dorsalis pulse intact bilaterally:  Yes Comments      Labs reviewed:  Basic Metabolic Panel:  Recent Labs  12/30/14 0229  02/10/15 0306  02/18/15 2010  02/19/15 1610  09/16/15 0910 12/19/15 1355 12/19/15 1448  NA 139  < > 140  < >  --   < >  --   < > 138 141 140  K 4.3  < > 4.1  < >  --   < >  --   < > 4.4 4.9 4.9  CL 108  < > 108  < >  --   < >  --   < > 105 105 105  CO2 24  < > 25  < >  --   < >  --   < > 22 25 24   GLUCOSE 130*  < > 147*  < >  --   < >  --   < > 127* 56* 55*  BUN 31*  < >  24*  < >  --   < >  --   < > 42* 40* 40*  CREATININE 1.47*  < > 1.34*  < > 2.13*  < > 2.24*  < > 1.78* 2.11* 2.10*  CALCIUM 9.3  < > 9.0  < >  --   < >  --   < > 9.0 9.0 9.0  MG  --   --  2.1  --  3.5*  --  2.8*  --   --   --   --   TSH 1.603  --   --   --   --   --   --   --   --   --  0.94  < > = values in this interval not displayed. Liver Function Tests:  Recent Labs  02/10/15 0306 02/16/15 0852 06/01/15 1500 09/16/15 0910 12/19/15 1448  AST 22 26  --   --  22  ALT 24 25 18 26 19   ALKPHOS 70 75  --   --  61  BILITOT 0.3 0.3  --   --  0.2  PROT 5.6* 6.5  --   --  5.7*  ALBUMIN 3.1* 3.5  --   --  3.2*   No results for input(s): LIPASE, AMYLASE in the last 8760 hours. No results for input(s): AMMONIA in the last 8760 hours. CBC:  Recent Labs  12/29/14 1521  02/03/15 1012  02/19/15 1619 02/20/15 0425 02/21/15 0552 02/28/15 1245  WBC 6.3  < > 4.1  < >  --  13.2* 10.8* 8.4  NEUTROABS 4.4  --  2.5  --   --   --   --  5.3  HGB 10.2*  < > 10.6*  < > 9.5* 8.7* 8.9*  --   HCT 29.5*  < > 30.2*  < > 28.0* 26.6* 26.6* 25.6*  MCV 91.0  < > 88.6  < >  --  91.1 90.2 91  PLT 241  < > 250  < >  --  170 173 438*  < > = values in this interval not displayed. Lipid Panel:  Recent Labs  06/01/15 1500 09/16/15 0916 12/19/15 1448  CHOL 149 149 153  HDL 45 56 49  LDLCALC 84 66 78  TRIG 100 133 128  CHOLHDL 3.3 2.7 3.1   Lab Results  Component Value Date   HGBA1C 7.4 (H) 12/19/2015    Procedures: Dg Hip Unilat W Or W/o Pelvis 2-3 Views Left  Result Date: 12/05/2015 CLINICAL DATA:  Left hip pain for 3 days.  No trauma. EXAM: DG HIP (WITH OR WITHOUT PELVIS) 2-3V LEFT COMPARISON:  None. FINDINGS: AP view pelvis and two views of the left hip. AP view pelvis demonstrates lower lumbar and lumbosacral spondylosis. Femoral heads are located. No acute fracture. Vascular calcifications. Joint spaces are maintained for age. IMPRESSION: No acute osseous abnormality. Lower lumbar  spondylosis. Advanced for age vascular calcifications. Electronically Signed   By: Abigail Miyamoto M.D.   On: 12/05/2015 11:02    Assessment/Plan   ICD-9-CM ICD-10-CM   1. Well adult exam V70.0 Z00.00   2. Heme positive stool 792.1 R19.5   3. Tinea cruris 110.3 B35.6 nystatin (MYCOSTATIN/NYSTOP) powder  4. Uncontrolled type 2 diabetes mellitus with diabetic nephropathy, without long-term current use of insulin (HCC) 250.42 E11.21    583.81 E11.65   5. Essential hypertension 401.9 I10   6. Hyperlipidemia with target LDL less than 100 272.4 E78.5   7. CKD (chronic kidney disease) stage 3, GFR 30-59 ml/min 585.3 N18.3   8. Bilateral lower extremity edema 782.3 R60.0   9. Chronic combined systolic and diastolic CHF (congestive heart failure) (HCC) 428.42 I50.42    428.0    10. PAD (peripheral artery disease) (HCC) 443.9 I73.9   11. Asthma, chronic, mild intermittent, with acute exacerbation 493.92 J45.21 montelukast (SINGULAIR) 10 MG tablet    Pt is UTD on health maintenance. Vaccinations are UTD. Pt maintains a healthy lifestyle. Encouraged pt to exercise 30-45 minutes 4-5 times per week. Eat a well balanced diet. Avoid smoking. Limit alcohol intake. Wear seatbelt when riding in the car. Wear sun block (SPF >50) when spending extended times outside.  Add PSA to labs already drawn  Use powder to groin folds 3 times daily x 4 weeks. Keep area dry  Continue other medications as ordered  Follow up with specialists as scheduled  Send iFOB home for colon cancer screening and (+) check in office. May need GI f/u  Follow up in 3 mos for routine visit. Fasting labs prior to appt   [place] S. Perlie Gold  [facility] [address] [place], [place] [address] [phone] Cell (Monday-Friday 8 AM - 5 PM) [phone] After 5 PM and follow prompts

## 2015-12-21 NOTE — Patient Instructions (Signed)
Encouraged him to exercise 30-45 minutes 4-5 times per week. Eat a well balanced diet. Avoid smoking. Limit alcohol intake. Wear seatbelt when riding in the car. Wear sun block (SPF >50) when spending extended times outside.  Use powder to groin folds 3 times daily x 4 weeks. Keep area dry  Continue other medications as ordered  Follow up with specialists as scheduled  Send iFOB home for colon cancer screening and (+) check in office  Follow up in 3 mos for routine visit. Fasting labs prior to appt

## 2015-12-22 ENCOUNTER — Other Ambulatory Visit: Payer: Self-pay

## 2015-12-22 ENCOUNTER — Other Ambulatory Visit: Payer: Federal, State, Local not specified - PPO

## 2015-12-22 DIAGNOSIS — R195 Other fecal abnormalities: Secondary | ICD-10-CM

## 2015-12-22 DIAGNOSIS — J4521 Mild intermittent asthma with (acute) exacerbation: Secondary | ICD-10-CM

## 2015-12-22 DIAGNOSIS — R600 Localized edema: Secondary | ICD-10-CM

## 2015-12-22 HISTORY — DX: Mild intermittent asthma with (acute) exacerbation: J45.21

## 2015-12-22 HISTORY — DX: Localized edema: R60.0

## 2015-12-23 LAB — FECAL OCCULT BLOOD, IMMUNOCHEMICAL: Fecal Occult Blood: NEGATIVE

## 2015-12-27 ENCOUNTER — Other Ambulatory Visit: Payer: Self-pay | Admitting: Internal Medicine

## 2015-12-29 ENCOUNTER — Encounter (HOSPITAL_COMMUNITY): Payer: Self-pay | Admitting: Emergency Medicine

## 2015-12-29 ENCOUNTER — Telehealth: Payer: Self-pay | Admitting: Cardiology

## 2015-12-29 ENCOUNTER — Inpatient Hospital Stay (HOSPITAL_COMMUNITY)
Admission: EM | Admit: 2015-12-29 | Discharge: 2015-12-31 | DRG: 291 | Disposition: A | Discharge status: Home / Self Care | Payer: Medicare Other | Attending: Family Medicine | Admitting: Family Medicine

## 2015-12-29 ENCOUNTER — Emergency Department (HOSPITAL_COMMUNITY): Payer: Medicare Other

## 2015-12-29 DIAGNOSIS — J45909 Unspecified asthma, uncomplicated: Secondary | ICD-10-CM | POA: Diagnosis present

## 2015-12-29 DIAGNOSIS — I2583 Coronary atherosclerosis due to lipid rich plaque: Secondary | ICD-10-CM

## 2015-12-29 DIAGNOSIS — E1129 Type 2 diabetes mellitus with other diabetic kidney complication: Secondary | ICD-10-CM | POA: Diagnosis present

## 2015-12-29 DIAGNOSIS — E669 Obesity, unspecified: Secondary | ICD-10-CM | POA: Diagnosis present

## 2015-12-29 DIAGNOSIS — Z833 Family history of diabetes mellitus: Secondary | ICD-10-CM | POA: Diagnosis not present

## 2015-12-29 DIAGNOSIS — D638 Anemia in other chronic diseases classified elsewhere: Secondary | ICD-10-CM | POA: Diagnosis present

## 2015-12-29 DIAGNOSIS — I130 Hypertensive heart and chronic kidney disease with heart failure and stage 1 through stage 4 chronic kidney disease, or unspecified chronic kidney disease: Principal | ICD-10-CM | POA: Diagnosis present

## 2015-12-29 DIAGNOSIS — I252 Old myocardial infarction: Secondary | ICD-10-CM | POA: Diagnosis not present

## 2015-12-29 DIAGNOSIS — E1165 Type 2 diabetes mellitus with hyperglycemia: Secondary | ICD-10-CM | POA: Diagnosis present

## 2015-12-29 DIAGNOSIS — Z6833 Body mass index (BMI) 33.0-33.9, adult: Secondary | ICD-10-CM | POA: Diagnosis not present

## 2015-12-29 DIAGNOSIS — N183 Chronic kidney disease, stage 3 unspecified: Secondary | ICD-10-CM | POA: Diagnosis present

## 2015-12-29 DIAGNOSIS — Z951 Presence of aortocoronary bypass graft: Secondary | ICD-10-CM

## 2015-12-29 DIAGNOSIS — I5043 Acute on chronic combined systolic (congestive) and diastolic (congestive) heart failure: Secondary | ICD-10-CM | POA: Diagnosis not present

## 2015-12-29 DIAGNOSIS — I2510 Atherosclerotic heart disease of native coronary artery without angina pectoris: Secondary | ICD-10-CM | POA: Diagnosis present

## 2015-12-29 DIAGNOSIS — IMO0002 Reserved for concepts with insufficient information to code with codable children: Secondary | ICD-10-CM | POA: Diagnosis present

## 2015-12-29 DIAGNOSIS — E1122 Type 2 diabetes mellitus with diabetic chronic kidney disease: Secondary | ICD-10-CM | POA: Diagnosis present

## 2015-12-29 DIAGNOSIS — M19042 Primary osteoarthritis, left hand: Secondary | ICD-10-CM | POA: Diagnosis present

## 2015-12-29 DIAGNOSIS — J4521 Mild intermittent asthma with (acute) exacerbation: Secondary | ICD-10-CM | POA: Diagnosis present

## 2015-12-29 DIAGNOSIS — Z7951 Long term (current) use of inhaled steroids: Secondary | ICD-10-CM | POA: Diagnosis not present

## 2015-12-29 DIAGNOSIS — Z87891 Personal history of nicotine dependence: Secondary | ICD-10-CM

## 2015-12-29 DIAGNOSIS — Z823 Family history of stroke: Secondary | ICD-10-CM

## 2015-12-29 DIAGNOSIS — E785 Hyperlipidemia, unspecified: Secondary | ICD-10-CM | POA: Diagnosis present

## 2015-12-29 DIAGNOSIS — Z8249 Family history of ischemic heart disease and other diseases of the circulatory system: Secondary | ICD-10-CM | POA: Diagnosis not present

## 2015-12-29 DIAGNOSIS — Z794 Long term (current) use of insulin: Secondary | ICD-10-CM

## 2015-12-29 DIAGNOSIS — D649 Anemia, unspecified: Secondary | ICD-10-CM | POA: Diagnosis not present

## 2015-12-29 DIAGNOSIS — Z7982 Long term (current) use of aspirin: Secondary | ICD-10-CM

## 2015-12-29 DIAGNOSIS — R05 Cough: Secondary | ICD-10-CM | POA: Diagnosis not present

## 2015-12-29 DIAGNOSIS — E1121 Type 2 diabetes mellitus with diabetic nephropathy: Secondary | ICD-10-CM

## 2015-12-29 DIAGNOSIS — I340 Nonrheumatic mitral (valve) insufficiency: Secondary | ICD-10-CM | POA: Diagnosis present

## 2015-12-29 DIAGNOSIS — R0602 Shortness of breath: Secondary | ICD-10-CM | POA: Diagnosis not present

## 2015-12-29 DIAGNOSIS — I10 Essential (primary) hypertension: Secondary | ICD-10-CM | POA: Diagnosis present

## 2015-12-29 DIAGNOSIS — R600 Localized edema: Secondary | ICD-10-CM | POA: Diagnosis present

## 2015-12-29 HISTORY — DX: Type 2 diabetes mellitus without complications: E11.9

## 2015-12-29 HISTORY — DX: Anemia, unspecified: D64.9

## 2015-12-29 LAB — BRAIN NATRIURETIC PEPTIDE: B Natriuretic Peptide: 574 pg/mL — ABNORMAL HIGH (ref 0.0–100.0)

## 2015-12-29 LAB — GLUCOSE, CAPILLARY: GLUCOSE-CAPILLARY: 114 mg/dL — AB (ref 65–99)

## 2015-12-29 LAB — CBC
HCT: 19.6 % — ABNORMAL LOW (ref 39.0–52.0)
Hemoglobin: 6.5 g/dL — CL (ref 13.0–17.0)
MCH: 30 pg (ref 26.0–34.0)
MCHC: 33.2 g/dL (ref 30.0–36.0)
MCV: 90.3 fL (ref 78.0–100.0)
PLATELETS: 304 10*3/uL (ref 150–400)
RBC: 2.17 MIL/uL — ABNORMAL LOW (ref 4.22–5.81)
RDW: 13.2 % (ref 11.5–15.5)
WBC: 5.8 10*3/uL (ref 4.0–10.5)

## 2015-12-29 LAB — BASIC METABOLIC PANEL
ANION GAP: 8 (ref 5–15)
BUN: 38 mg/dL — ABNORMAL HIGH (ref 6–20)
CALCIUM: 8.7 mg/dL — AB (ref 8.9–10.3)
CHLORIDE: 106 mmol/L (ref 101–111)
CO2: 22 mmol/L (ref 22–32)
CREATININE: 2.14 mg/dL — AB (ref 0.61–1.24)
GFR calc Af Amer: 35 mL/min — ABNORMAL LOW (ref >60)
GFR calc non Af Amer: 30 mL/min — ABNORMAL LOW (ref >60)
GLUCOSE: 235 mg/dL — AB (ref 65–99)
POTASSIUM: 4.7 mmol/L (ref 3.5–5.1)
SODIUM: 136 mmol/L (ref 135–145)

## 2015-12-29 LAB — I-STAT TROPONIN, ED: TROPONIN I, POC: 0.06 ng/mL (ref 0.00–0.08)

## 2015-12-29 LAB — TROPONIN I: TROPONIN I: 0.07 ng/mL — AB (ref <0.03)

## 2015-12-29 LAB — PREPARE RBC (CROSSMATCH)

## 2015-12-29 MED ORDER — ACETAMINOPHEN 325 MG PO TABS
650.0000 mg | ORAL_TABLET | ORAL | Status: DC | PRN
  Administered 2015-12-30: 650 mg via ORAL
  Filled 2015-12-29: qty 2

## 2015-12-29 MED ORDER — ASPIRIN EC 325 MG PO TBEC
325.0000 mg | DELAYED_RELEASE_TABLET | Freq: Every day | ORAL | Status: DC
  Administered 2015-12-30 – 2015-12-31 (×2): 325 mg via ORAL
  Filled 2015-12-29 (×2): qty 1

## 2015-12-29 MED ORDER — SODIUM CHLORIDE 0.9% FLUSH
3.0000 mL | Freq: Two times a day (BID) | INTRAVENOUS | Status: DC
  Administered 2015-12-29 – 2015-12-31 (×4): 3 mL via INTRAVENOUS

## 2015-12-29 MED ORDER — FUROSEMIDE 10 MG/ML IJ SOLN
40.0000 mg | Freq: Once | INTRAMUSCULAR | Status: AC
  Administered 2015-12-29: 40 mg via INTRAVENOUS
  Filled 2015-12-29: qty 4

## 2015-12-29 MED ORDER — AMLODIPINE BESYLATE 10 MG PO TABS
10.0000 mg | ORAL_TABLET | Freq: Every day | ORAL | Status: DC
  Administered 2015-12-30 – 2015-12-31 (×2): 10 mg via ORAL
  Filled 2015-12-29 (×2): qty 1

## 2015-12-29 MED ORDER — HYDRALAZINE HCL 25 MG PO TABS
25.0000 mg | ORAL_TABLET | Freq: Two times a day (BID) | ORAL | Status: DC
  Administered 2015-12-29 – 2015-12-31 (×4): 25 mg via ORAL
  Filled 2015-12-29 (×4): qty 1

## 2015-12-29 MED ORDER — INSULIN ASPART 100 UNIT/ML FLEXPEN: 5.0000 [IU] | PEN_INJECTOR | Freq: Two times a day (BID) | SUBCUTANEOUS | Status: DC

## 2015-12-29 MED ORDER — BENAZEPRIL HCL 20 MG PO TABS
20.0000 mg | ORAL_TABLET | Freq: Every day | ORAL | Status: DC
  Administered 2015-12-30 – 2015-12-31 (×2): 20 mg via ORAL
  Filled 2015-12-29 (×2): qty 1

## 2015-12-29 MED ORDER — CARVEDILOL 12.5 MG PO TABS
12.5000 mg | ORAL_TABLET | Freq: Two times a day (BID) | ORAL | Status: DC
  Administered 2015-12-30 – 2015-12-31 (×4): 12.5 mg via ORAL
  Filled 2015-12-29 (×4): qty 1

## 2015-12-29 MED ORDER — IPRATROPIUM-ALBUTEROL 0.5-2.5 (3) MG/3ML IN SOLN
3.0000 mL | RESPIRATORY_TRACT | Status: AC
Start: 2015-12-29 — End: 2015-12-29
  Administered 2015-12-29 (×2): 3 mL via RESPIRATORY_TRACT
  Filled 2015-12-29: qty 3
  Filled 2015-12-29: qty 6

## 2015-12-29 MED ORDER — ATORVASTATIN CALCIUM 40 MG PO TABS
40.0000 mg | ORAL_TABLET | Freq: Every evening | ORAL | Status: DC
  Administered 2015-12-30: 40 mg via ORAL
  Filled 2015-12-29: qty 1

## 2015-12-29 MED ORDER — SODIUM CHLORIDE 0.9 % IV SOLN
Freq: Once | INTRAVENOUS | Status: AC
  Administered 2015-12-29: 21:00:00 via INTRAVENOUS

## 2015-12-29 MED ORDER — MONTELUKAST SODIUM 10 MG PO TABS
10.0000 mg | ORAL_TABLET | Freq: Every day | ORAL | Status: DC
  Administered 2015-12-29 – 2015-12-30 (×2): 10 mg via ORAL
  Filled 2015-12-29 (×2): qty 1

## 2015-12-29 MED ORDER — MOMETASONE FURO-FORMOTEROL FUM 100-5 MCG/ACT IN AERO
2.0000 | INHALATION_SPRAY | Freq: Two times a day (BID) | RESPIRATORY_TRACT | Status: DC
  Administered 2015-12-30 – 2015-12-31 (×3): 2 via RESPIRATORY_TRACT
  Filled 2015-12-29: qty 8.8

## 2015-12-29 MED ORDER — FLUTICASONE PROPIONATE 50 MCG/ACT NA SUSP
2.0000 | Freq: Every day | NASAL | Status: DC | PRN
  Filled 2015-12-29: qty 16

## 2015-12-29 MED ORDER — ENOXAPARIN SODIUM 40 MG/0.4ML ~~LOC~~ SOLN
40.0000 mg | SUBCUTANEOUS | Status: DC
  Administered 2015-12-29 – 2015-12-30 (×2): 40 mg via SUBCUTANEOUS
  Filled 2015-12-29 (×2): qty 0.4

## 2015-12-29 MED ORDER — SODIUM CHLORIDE 0.9% FLUSH: 3.0000 mL | INTRAVENOUS | Status: DC | PRN

## 2015-12-29 MED ORDER — INSULIN DETEMIR 100 UNIT/ML ~~LOC~~ SOLN
60.0000 [IU] | Freq: Two times a day (BID) | SUBCUTANEOUS | Status: DC
Start: 2015-12-29 — End: 2015-12-31
  Administered 2015-12-30 – 2015-12-31 (×4): 60 [IU] via SUBCUTANEOUS
  Filled 2015-12-29 (×5): qty 0.6

## 2015-12-29 MED ORDER — MOMETASONE FURO-FORMOTEROL FUM 100-5 MCG/ACT IN AERO: 2.0000 | INHALATION_SPRAY | Freq: Two times a day (BID) | RESPIRATORY_TRACT | Status: DC

## 2015-12-29 MED ORDER — ONDANSETRON HCL 4 MG/2ML IJ SOLN: 4.0000 mg | Freq: Four times a day (QID) | INTRAMUSCULAR | Status: DC | PRN

## 2015-12-29 MED ORDER — SODIUM CHLORIDE 0.9 % IV SOLN: 250.0000 mL | INTRAVENOUS | Status: DC | PRN

## 2015-12-29 MED ORDER — ATORVASTATIN CALCIUM 40 MG PO TABS: 40.0000 mg | ORAL_TABLET | Freq: Every evening | ORAL | Status: DC

## 2015-12-29 NOTE — ED Notes (Signed)
CRITICAL VALUE ALERT  Critical value received:  hgb 6.5  Date of notification:  12/29/2015   Time of notification:  6213  Critical value read back:Yes.    Nurse who received alert:  Leodis Rains  MD notified (1st page):  Dr. Dayna Barker notified at 1416 No further orders at this time

## 2015-12-29 NOTE — ED Triage Notes (Signed)
Pt sts increased SOB and weight gain x 3 days; pt with hx of CHF

## 2015-12-29 NOTE — ED Provider Notes (Signed)
[facility] DEPT Provider Note   CSN: [id number] Arrival date & time: 12/29/15  1308     History   Chief Complaint Chief Complaint  Patient presents with  . Shortness of Breath    HPI Henry Carroll is a 65 y.o. male.  The history is provided by the patient.  Shortness of Breath  This is a new problem. The average episode lasts 1 month. The problem occurs continuously.The problem has been gradually worsening. Associated symptoms include leg swelling (worsening over the last 1-2 weeks). Pertinent negatives include no fever, no coryza, no rhinorrhea, no cough and no sputum production. He has tried beta-agonist inhalers for the symptoms. The treatment provided no relief. Associated medical issues include asthma, heart failure and past MI.    Past Medical History:  Diagnosis Date  . Anemia   . Anxiety   . 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 Asthma   . Chronic combined systolic (congestive) and diastolic (congestive) heart failure    a. 12/31/14: 2D ECHO: EF 40-45%, HK of inf myocardium, G1DD, mod MR  . CKD (chronic kidney disease), stage III   . Coronary artery disease    a. LHC 01/2015 - triple vessel CAD (mod oLM, mLAD, severe mRCA, intermediate branch stenosis, CTO of mCx). Plan CABG 02/2015.  . Diabetes mellitus   . Hyperlipidemia   . Hypertension   . Mitral regurgitation    a. Mild-mod by echo 12/2014.  Marland Kitchen Myocardial infarction    pt. states per [name] he has in the past.  . NSVT (nonsustained ventricular tachycardia) (HCC)    a. 9 beats during 01/2015 adm. BB titrated.  . Obesity    a. BMI 33    Patient Active Problem List   Diagnosis Date Noted  . Bilateral lower extremity edema 12/22/2015  . Asthma, chronic, mild intermittent, with acute exacerbation 12/22/2015  . PAD (peripheral artery disease) ([place]) 03/02/2015  . Chronic systolic heart failure ([place]) 02/25/2015  . Hypertensive heart disease with heart failure ([place]) 02/25/2015  . CAD (coronary artery  disease) 02/18/2015  . CAD in native artery 02/10/2015  . Abnormal stress test 02/10/2015  . Mitral regurgitation 02/10/2015  . Chronic combined systolic and diastolic CHF (congestive heart failure) ([place]) 02/10/2015  . NSVT (nonsustained ventricular tachycardia) ([place]) 02/10/2015  . Acute on chronic systolic and diastolic heart failure, NYHA class 1 ([place]) 12/31/2014  . Hyperlipidemia with target LDL less than 100 12/31/2014  . Demand ischemia ([place]) 12/31/2014  . Morbid obesity ([place]) 12/29/2014  . Elevated troponin   . Essential hypertension 10/09/2013  . DM type 2, uncontrolled, with renal complications ([place]) 03/47/4259  . Obesity (BMI 30-39.9) 05/05/2013  . CKD (chronic kidney disease) stage 3, GFR 30-59 ml/min 02/18/2013  . Folliculitis of perineum 10/01/2012  . Anemia in chronic kidney disease 09/03/2012  . Other malaise and fatigue 09/03/2012  . 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 Asthma, chronic 06/04/2012  . COLONIC POLYPS, HX OF 04/13/2009    Past Surgical History:  Procedure Laterality Date  . CARDIAC CATHETERIZATION N/A 02/09/2015   Procedure: Left Heart Cath and Coronary Angiography;  Surgeon: Burnell Blanks, MD;  Location: [facility];  Service: Cardiovascular;  Laterality: N/A;  . COLON SURGERY  2011   Colonoscopy  . CORONARY ARTERY BYPASS GRAFT N/A 02/18/2015   Procedure: CORONARY ARTERY BYPASS GRAFTING (CABG) x  four, using bilateral internal mammary arteries and right leg greater saphenous vein harvested endoscopically;  Surgeon: [name],  MD;  Location: [facility];  Service: Open Heart Surgery;  Laterality: N/A;  . HERNIA REPAIR  2094   Umbilical  . SPINE SURGERY  2006   L4 & L5  . TEE WITHOUT CARDIOVERSION N/A 02/18/2015   Procedure: TRANSESOPHAGEAL ECHOCARDIOGRAM (TEE);  Surgeon: [name], MD;  Location: [facility];  Service: Open Heart Surgery;  Laterality: N/A;  . TONSILLECTOMY  1962       Home Medications    Prior to Admission  medications   Medication Sig Start Date End Date Taking? Authorizing Provider  acetaminophen (TYLENOL) 500 MG tablet Take 1,000 mg by mouth 2 (two) times daily as needed for moderate pain.   Yes Historical Provider, MD  albuterol (PROVENTIL) (2.5 MG/3ML) 0.083% nebulizer solution USE ONE VIAL (3 ML) IN NEBULIZER EVERY 6 HOURS AS NEEDED FOR WHEEZING OR SHORTNESS OF BREATH. Dx:J45.21, R06.2 12/14/15  Yes Monica Carter, DO  amLODipine (NORVASC) 10 MG tablet TAKE ONE TABLET BY MOUTH ONCE DAILY Patient taking differently: Take 10 mg by mouth in the morning 12/27/15  Yes Gildardo Cranker, DO  aspirin EC 325 MG EC tablet Take 1 tablet (325 mg total) by mouth daily. 02/22/15  Yes Donielle Liston Alba, PA-C  atorvastatin (LIPITOR) 40 MG tablet Take 1 tablet (40 mg total) by mouth every evening. Patient taking differently: Take 40 mg by mouth daily.  08/08/15  Yes Jerline Pain, MD  benazepril (LOTENSIN) 40 MG tablet TAKE ONE TABLET BY MOUTH ONCE DAILY FOR BLOOD PRESSURE Patient taking differently: Take 40 mg by mouth in the morning 10/10/15  Yes Gildardo Cranker, DO  carvedilol (COREG) 12.5 MG tablet TAKE ONE TABLET BY MOUTH TWICE DAILY WITH A MEAL Patient taking differently: Take 12.5 mg by mouth two times a day 10/19/15  Yes Jerline Pain, MD  Cholecalciferol ([employer] VITAMIN D3) 1000 UNITS capsule Take 2 capsules (2,000 Units total) by mouth daily. 10/07/13  Yes Mahima Pandey, MD  CINNAMON PO Take 1 capsule by mouth 2 (two) times daily.    Yes Historical Provider, MD  Coenzyme Q10 (CO Q 10 PO) Take 200 mg by mouth 2 (two) times daily.    Yes Historical Provider, MD  fluticasone (FLONASE) 50 MCG/ACT nasal spray Place 2 sprays into both nostrils daily as needed for allergies or rhinitis (SEASONAL ALLERGIES).   Yes Historical Provider, MD  Fluticasone-Salmeterol (ADVAIR DISKUS) 100-50 MCG/DOSE AEPB Inhale 1 puff into the lungs 2 (two) times daily. Patient taking differently: Inhale 1 puff into the lungs daily.  09/07/15  Yes  Gildardo Cranker, DO  furosemide (LASIX) 40 MG tablet Take 1 tablet (40 mg total) by mouth 2 (two) times daily. 10/28/15 01/26/16 Yes Jerline Pain, MD  hydrALAZINE (APRESOLINE) 25 MG tablet Take 25 mg by mouth 2 (two) times daily. 05/08/15  Yes Historical Provider, MD  insulin aspart (NOVOLOG) 100 UNIT/ML FlexPen Take 5 units above 250, 10 units above 300. Patient taking differently: Inject 5-10 Units into the skin 2 (two) times daily before a meal. 5 units if BGL is 200 or greater and 10 units if 250 or greater 09/13/15  Yes Tereasa Coop, PA-C  insulin detemir (LEVEMIR) 100 UNIT/ML injection Inject 60 Units into the skin 2 (two) times daily.    Yes Historical Provider, MD  montelukast (SINGULAIR) 10 MG tablet Take 1 tablet (10 mg total) by mouth at bedtime. 12/21/15  Yes Gildardo Cranker, DO  Multiple Vitamins-Minerals (ONE-A-DAY MENS 50+ ADVANTAGE) TABS Take 1 tablet by mouth daily.  Yes Historical Provider, MD  nystatin (MYCOSTATIN/NYSTOP) powder Apply topically 3 (three) times daily. To groin folds x 4 weeks or until healed 12/21/15  Yes Monica Carter, DO  ONE TOUCH ULTRA TEST test strip USE ONE STRIP TO CHECK GLUCOSE THREE TIMES DAILY 06/14/15  Yes Monica Carter, DO  PROAIR HFA 108 [phone] Base) MCG/ACT inhaler INHALE TWO PUFFS BY MOUTH EVERY 6 HOURS AS NEEDED FOR SHORTNESS OF BREATH 08/10/15  Yes Gildardo Cranker, DO  terbinafine (LAMISIL) 1 % cream Apply 1 application topically daily as needed (foot).    Yes Historical Provider, MD  TRADJENTA 5 MG TABS tablet TAKE ONE TABLET BY MOUTH ONCE DAILY Patient taking differently: Take 5 mg by mouth in the morning 09/08/15  Yes Gildardo Cranker, DO  ADVAIR DISKUS 100-50 MCG/DOSE AEPB INHALE ONE DOSE BY MOUTH TWICE DAILY Patient not taking: Reported on 12/29/2015 11/02/15   Gildardo Cranker, DO    Family History Family History  Problem Relation Age of Onset  . Diabetes Mother   . Heart disease Mother     later in life, age >37  . Heart disease Father     heart failure  later in life  . Hypertension Father   . Stroke Sister   . Kidney disease Daughter   . Sudden death Daughter   . Heart attack Neg Hx     Social History Social History  Substance Use Topics  . Smoking status: Former Smoker    Types: Cigars    Quit date: 12/31/2014  . Smokeless tobacco: Never Used  . Alcohol use 0.0 oz/week     Comment: socailly     Allergies   Patient has no known allergies.   Review of Systems Review of Systems  Constitutional: Negative for fever.  HENT: Negative for rhinorrhea.   Respiratory: Positive for shortness of breath. Negative for cough and sputum production.   Cardiovascular: Positive for leg swelling (worsening over the last 1-2 weeks).     Physical Exam Updated Vital Signs BP 156/81 (BP Location: Right Arm)   Pulse 89   Temp 98 F (36.7 C) (Oral)   Resp 20   Ht 6\' 1"  (1.854 m)   Wt 252 lb (114.3 kg)   SpO2 99%   BMI 33.25 kg/m   Physical Exam  Constitutional: He is oriented to person, place, and time. He appears well-developed and well-nourished. No distress.  HENT:  Head: Normocephalic and atraumatic.  Nose: Nose normal.  Eyes: Conjunctivae and EOM are normal. Pupils are equal, round, and reactive to light. Right eye exhibits no discharge. Left eye exhibits no discharge. No scleral icterus.  Neck: Normal range of motion. Neck supple.  Cardiovascular: Normal rate and regular rhythm.  Exam reveals no gallop and no friction rub.   No murmur heard. Pulmonary/Chest: Effort normal. No stridor. No respiratory distress. He has decreased breath sounds in the right lower field and the left lower field. He has wheezes in the right upper field, the right middle field, the left upper field and the left middle field. He has no rales.  Abdominal: Soft. He exhibits no distension. There is no tenderness.  Musculoskeletal: He exhibits no edema or tenderness.  2+ BLE edema  Neurological: He is alert and oriented to person, place, and time.  Skin:  Skin is warm and dry. No rash noted. He is not diaphoretic. No erythema.  Psychiatric: He has a normal mood and affect.  Vitals reviewed.    ED Treatments / Results  Labs (all labs  ordered are listed, but only abnormal results are displayed) Labs Reviewed  BASIC METABOLIC PANEL - Abnormal; Notable for the following:       Result Value   Glucose, Bld 235 (*)    BUN 38 (*)    Creatinine, Ser 2.14 (*)    Calcium 8.7 (*)    GFR calc non Af Amer 30 (*)    GFR calc Af Amer 35 (*)    All other components within normal limits  CBC - Abnormal; Notable for the following:    RBC 2.17 (*)    Hemoglobin 6.5 (*)    HCT 19.6 (*)    All other components within normal limits  BRAIN NATRIURETIC PEPTIDE  I-STAT TROPOININ, ED  TYPE AND SCREEN    EKG  EKG Interpretation  Date/Time:  Thursday December 29 2015 13:12:48 EST Ventricular Rate:  94 PR Interval:  148 QRS Duration: 86 QT Interval:  368 QTC Calculation: 460 R Axis:   27 Text Interpretation:  Sinus rhythm with Premature supraventricular complexes Nonspecific ST and T wave abnormality Abnormal ECG No significant change since last tracing Confirmed by [facility] MD, Md Smola ([id number]) on 12/29/2015 3:37:55 PM       Radiology Dg Chest 2 View  Result Date: 12/29/2015 CLINICAL DATA:  Shortness of breath and productive cough. Bilateral lower extremity swelling. Chest tightness. EXAM: CHEST  2 VIEW COMPARISON:  PA and lateral chest 03/23/2015 and 12/29/2014. FINDINGS: The patient is status post CABG. Heart size is upper normal. No pneumothorax or pleural effusion. IMPRESSION: No acute disease. Electronically Signed   By: Inge Rise M.D.   On: 12/29/2015 14:09    Procedures Procedures (including critical care time)  Medications Ordered in ED Medications  ipratropium-albuterol (DUONEB) 0.5-2.5 (3) MG/3ML nebulizer solution 3 mL (3 mLs Nebulization Given 12/29/15 1602)     Initial Impression / Assessment and Plan / ED Course  I  have reviewed the triage vital signs and the nursing notes.  Pertinent labs & imaging results that were available during my care of the patient were reviewed by me and considered in my medical decision making (see chart for details).  Clinical Course     SOB improved with Duonebs; however, pt still with evidence of volume overload on exam concerning for CHF exacerbation. In addition, pt has Hb of 6.5 (>8 at baseline). With the DOE and fatigue, pt may also be symptomatic from the anemia. CXR w/o pna, or pulmonary edema. Discussed case with Hospitalist who will admit for further management.   Final Clinical Impressions(s) / ED Diagnoses   Final diagnoses:  None    New Prescriptions New Prescriptions   No medications on file     Fatima Blank, MD 12/29/15 1747

## 2015-12-29 NOTE — H&P (Signed)
History and Physical    Henry Carroll:[id number] DOB: October 30, 1950 DOA: 12/29/2015  PCP: Gildardo Cranker, DO   Patient coming from: Home  Chief Complaint: Dyspnea on exertion  HPI: Henry Carroll is a 65 y.o. male with medical history significant of CHF, CAD s/p CABG, diabetes mellitus, renal failure (Stage III) who presents with weight gain- gained about 12 pounds in the past week and a half.  Has developed swelling in the ankles and feet that has been around for some time but got worse two days ago.  Started developing issues laying down- initially thought it was attributed to upper respiratory infection.  Last night developed a significant cough and had minimal energy.  Had an extreme dry cough that he describes as dry and hacking.  Developed a bit of confusion this morning which he states he mixed up days of doctors appointment.  Patient called his doctor and was endorsed to come into the Emergency department for further evaluation.  Patient states that he fairly consistently has swelling in his legs but not normally in his feet.  He was previously admitted last year for CHF and states that he had similar symptoms.  No history of chest pain, chest pressure. Can walk about 200 yards prior to getting short of breath.  No melena or hematochezia.  Has physical with PCP last week and was hemoccult negative.  No blood in urine.  Never received a blood transfusion.  Has appointment with nephrologist tomorrow.  Last echocardiogram was after his CABG in January and showed an EF of 45-50%.    No history of OSA.  Independent for all ADL's.  ED Course: Patient was seen and evaluated.  He received a Duoneb and laboratory data was obtained.  He was found to have a hemoglobin of 6.5 (baseline of 8.5). BNP is currently pending  Review of Systems: As per HPI otherwise 10 point review of systems negative.    Past Medical History:  Diagnosis Date  . Anemia   . Anxiety   . Arthritis    left  5th  finger  . Asthma   . Chronic combined systolic (congestive) and diastolic (congestive) heart failure    a. 12/31/14: 2D ECHO: EF 40-45%, HK of inf myocardium, G1DD, mod MR  . CKD (chronic kidney disease), stage III   . Coronary artery disease    a. LHC 01/2015 - triple vessel CAD (mod oLM, mLAD, severe mRCA, intermediate branch stenosis, CTO of mCx). Plan CABG 02/2015.  . Diabetes mellitus   . Hyperlipidemia   . Hypertension   . Mitral regurgitation    a. Mild-mod by echo 12/2014.  Marland Kitchen Myocardial infarction    pt. states per [name] he has in the past.  . NSVT (nonsustained ventricular tachycardia) (HCC)    a. 9 beats during 01/2015 adm. BB titrated.  . Obesity    a. BMI 33    Past Surgical History:  Procedure Laterality Date  . CARDIAC CATHETERIZATION N/A 02/09/2015   Procedure: Left Heart Cath and Coronary Angiography;  Surgeon: Burnell Blanks, MD;  Location: [facility];  Service: Cardiovascular;  Laterality: N/A;  . COLON SURGERY  2011   Colonoscopy  . CORONARY ARTERY BYPASS GRAFT N/A 02/18/2015   Procedure: CORONARY ARTERY BYPASS GRAFTING (CABG) x  four, using bilateral internal mammary arteries and right leg greater saphenous vein harvested endoscopically;  Surgeon: [name], MD;  Location: [place] [facility];  Service: Open Heart Surgery;  Laterality: N/A;  .  HERNIA REPAIR  1478   Umbilical  . SPINE SURGERY  2006   L4 & L5  . TEE WITHOUT CARDIOVERSION N/A 02/18/2015   Procedure: TRANSESOPHAGEAL ECHOCARDIOGRAM (TEE);  Surgeon: [name], MD;  Location: [facility];  Service: Open Heart Surgery;  Laterality: N/A;  . TONSILLECTOMY  1962     reports that he quit smoking about a year ago. His smoking use included Cigars. He has never used smokeless tobacco. He reports that he drinks alcohol. He reports that he does not use drugs.  No Known Allergies  Family History  Problem Relation Age of Onset  . Diabetes Mother   . Heart disease Mother     later in life, age >63    . Heart disease Father     heart failure later in life  . Hypertension Father   . Stroke Sister   . Kidney disease Daughter   . Sudden death Daughter   . Heart attack Neg Hx     Prior to Admission medications   Medication Sig Start Date End Date Taking? Authorizing Provider  acetaminophen (TYLENOL) 500 MG tablet Take 1,000 mg by mouth 2 (two) times daily as needed for moderate pain.   Yes Historical Provider, MD  albuterol (PROVENTIL) (2.5 MG/3ML) 0.083% nebulizer solution USE ONE VIAL (3 ML) IN NEBULIZER EVERY 6 HOURS AS NEEDED FOR WHEEZING OR SHORTNESS OF BREATH. Dx:J45.21, R06.2 12/14/15  Yes Monica Carter, DO  amLODipine (NORVASC) 10 MG tablet TAKE ONE TABLET BY MOUTH ONCE DAILY Patient taking differently: Take 10 mg by mouth in the morning 12/27/15  Yes Gildardo Cranker, DO  aspirin EC 325 MG EC tablet Take 1 tablet (325 mg total) by mouth daily. 02/22/15  Yes Donielle Liston Alba, PA-C  atorvastatin (LIPITOR) 40 MG tablet Take 1 tablet (40 mg total) by mouth every evening. Patient taking differently: Take 40 mg by mouth daily.  08/08/15  Yes Jerline Pain, MD  benazepril (LOTENSIN) 40 MG tablet TAKE ONE TABLET BY MOUTH ONCE DAILY FOR BLOOD PRESSURE Patient taking differently: Take 40 mg by mouth in the morning 10/10/15  Yes Gildardo Cranker, DO  carvedilol (COREG) 12.5 MG tablet TAKE ONE TABLET BY MOUTH TWICE DAILY WITH A MEAL Patient taking differently: Take 12.5 mg by mouth two times a day 10/19/15  Yes Jerline Pain, MD  Cholecalciferol ([employer] VITAMIN D3) 1000 UNITS capsule Take 2 capsules (2,000 Units total) by mouth daily. 10/07/13  Yes Mahima Pandey, MD  CINNAMON PO Take 1 capsule by mouth 2 (two) times daily.    Yes Historical Provider, MD  Coenzyme Q10 (CO Q 10 PO) Take 200 mg by mouth 2 (two) times daily.    Yes Historical Provider, MD  fluticasone (FLONASE) 50 MCG/ACT nasal spray Place 2 sprays into both nostrils daily as needed for allergies or rhinitis (SEASONAL ALLERGIES).   Yes  Historical Provider, MD  Fluticasone-Salmeterol (ADVAIR DISKUS) 100-50 MCG/DOSE AEPB Inhale 1 puff into the lungs 2 (two) times daily. Patient taking differently: Inhale 1 puff into the lungs daily.  09/07/15  Yes Gildardo Cranker, DO  furosemide (LASIX) 40 MG tablet Take 1 tablet (40 mg total) by mouth 2 (two) times daily. 10/28/15 01/26/16 Yes Jerline Pain, MD  hydrALAZINE (APRESOLINE) 25 MG tablet Take 25 mg by mouth 2 (two) times daily. 05/08/15  Yes Historical Provider, MD  insulin aspart (NOVOLOG) 100 UNIT/ML FlexPen Take 5 units above 250, 10 units above 300. Patient taking differently: Inject 5-10 Units into the skin  2 (two) times daily before a meal. 5 units if BGL is 200 or greater and 10 units if 250 or greater 09/13/15  Yes Tereasa Coop, PA-C  insulin detemir (LEVEMIR) 100 UNIT/ML injection Inject 60 Units into the skin 2 (two) times daily.    Yes Historical Provider, MD  montelukast (SINGULAIR) 10 MG tablet Take 1 tablet (10 mg total) by mouth at bedtime. 12/21/15  Yes Gildardo Cranker, DO  Multiple Vitamins-Minerals (ONE-A-DAY MENS 50+ ADVANTAGE) TABS Take 1 tablet by mouth daily.   Yes Historical Provider, MD  nystatin (MYCOSTATIN/NYSTOP) powder Apply topically 3 (three) times daily. To groin folds x 4 weeks or until healed 12/21/15  Yes Monica Carter, DO  ONE TOUCH ULTRA TEST test strip USE ONE STRIP TO CHECK GLUCOSE THREE TIMES DAILY 06/14/15  Yes Monica Carter, DO  PROAIR HFA 108 [id number] Base) MCG/ACT inhaler INHALE TWO PUFFS BY MOUTH EVERY 6 HOURS AS NEEDED FOR SHORTNESS OF BREATH 08/10/15  Yes Gildardo Cranker, DO  terbinafine (LAMISIL) 1 % cream Apply 1 application topically daily as needed (foot).    Yes Historical Provider, MD  TRADJENTA 5 MG TABS tablet TAKE ONE TABLET BY MOUTH ONCE DAILY Patient taking differently: Take 5 mg by mouth in the morning 09/08/15  Yes Gildardo Cranker, DO  ADVAIR DISKUS 100-50 MCG/DOSE AEPB INHALE ONE DOSE BY MOUTH TWICE DAILY Patient not taking: Reported on 12/29/2015  11/02/15   Gildardo Cranker, DO    Physical Exam: Vitals:   12/29/15 1625 12/29/15 1630 12/29/15 1645 12/29/15 1700  BP: 157/80 153/76 158/85 152/83  Pulse: (!) 59 83 88 87  Resp: _0 Temp:      TempSrc:      SpO2: 100% 97% 100% 100%  Weight:      Height:          Constitutional: NAD, calm, comfortable Vitals:   12/29/15 1625 12/29/15 1630 12/29/15 1645 12/29/15 1700  BP: 157/80 153/76 158/85 152/83  Pulse: (!) 59 83 88 87  Resp: _1 Temp:      TempSrc:      SpO2: 100% 97% 100% 100%  Weight:      Height:       Eyes: PERRL, lids and conjunctivae normal, arcus senalis ENMT: Mucous membranes are moist. Posterior pharynx clear of any exudate or lesions. Dentures in place  Neck: normal, supple, no masses, no thyromegaly Respiratory: no rales but wheezing appreciated on expiration in middle and lower lung fields Cardiovascular: Regular rate and rhythm, no murmurs / rubs / gallops. 2+ edema to mid thigh bilaterally. 2+ pedal pulses. No carotid bruits.  Abdomen: no tenderness, no masses palpated. No hepatosplenomegaly. Bowel sounds positive.  Musculoskeletal: no clubbing / cyanosis. No joint deformity upper and lower extremities. Good ROM, no contractures. Normal muscle tone.  Skin: no rashes, lesions, ulcers. No induration Neurologic: CN 2-12 grossly intact. Sensation intact, DTR normal. Strength 5/5 in all 4.  Psychiatric: Normal judgment and insight. Alert and oriented x 3. Normal mood.    Labs on Admission: I have personally reviewed following labs and imaging studies  CBC:  Recent Labs Lab 12/29/15 1328  WBC 5.8  HGB 6.5*  HCT 19.6*  MCV 90.3  PLT 891   Basic Metabolic Panel:  Recent Labs Lab 12/29/15 1328  NA 136  K 4.7  CL 106  CO2 22  GLUCOSE 235*  BUN 38*  CREATININE 2.14*  CALCIUM 8.7*   GFR: Estimated Creatinine Clearance: 44.4 mL/min (  by C-G formula based on SCr of 2.14 mg/dL (H)). Liver Function Tests: No results for input(s):  AST, ALT, ALKPHOS, BILITOT, PROT, ALBUMIN in the last 168 hours. No results for input(s): LIPASE, AMYLASE in the last 168 hours. No results for input(s): AMMONIA in the last 168 hours. Coagulation Profile: No results for input(s): INR, PROTIME in the last 168 hours. Cardiac Enzymes: No results for input(s): CKTOTAL, CKMB, CKMBINDEX, TROPONINI in the last 168 hours. BNP (last 3 results) No results for input(s): PROBNP in the last 8760 hours. HbA1C: No results for input(s): HGBA1C in the last 72 hours. CBG: No results for input(s): GLUCAP in the last 168 hours. Lipid Profile: No results for input(s): CHOL, HDL, LDLCALC, TRIG, CHOLHDL, LDLDIRECT in the last 72 hours. Thyroid Function Tests: No results for input(s): TSH, T4TOTAL, FREET4, T3FREE, THYROIDAB in the last 72 hours. Anemia Panel: No results for input(s): VITAMINB12, FOLATE, FERRITIN, TIBC, IRON, RETICCTPCT in the last 72 hours. Urine analysis:    Component Value Date/Time   COLORURINE YELLOW 02/16/2015 0852   APPEARANCEUR CLEAR 02/16/2015 0852   LABSPEC 1.015 02/16/2015 0852   PHURINE 6.5 02/16/2015 0852   GLUCOSEU NEGATIVE 02/16/2015 0852   HGBUR NEGATIVE 02/16/2015 0852   BILIRUBINUR NEGATIVE 02/16/2015 0852   KETONESUR NEGATIVE 02/16/2015 0852   PROTEINUR >300 (A) 02/16/2015 0852   NITRITE NEGATIVE 02/16/2015 0852   LEUKOCYTESUR NEGATIVE 02/16/2015 0852   Sepsis Labs: !!!!!!!!!!!!!!!!!!!!!!!!!!!!!!!!!!!!!!!!!!!! _0 (procalcitonin:4,lacticidven:4) ) Recent Results (from the past 240 hour(s))  Fecal occult blood, imunochemical     Status: None   Collection Time: 12/22/15  4:56 PM  Result Value Ref Range Status   Fecal Occult Blood NEG Negative Final    Comment:   The testing platform for fecal occult blood testing is changing from Hemosure iFOB to InSure FIT testing. The StockClerk number for the [employer] is [id number]. For InSure FIT specimen requirements see order codes [id number]) Fecal Globin By  Immunochemistry (Medicare). Hemosure iFOB testing will be phased out over the next few months and you will no longer be able to order [phone]) Fecal Occult Blood, Immunochemical (Medicare). If you have any questions, please contact your [facility] Account Representative directly, or call our Customer Service Department at [phone].        Radiological Exams on Admission: Dg Chest 2 View  Result Date: 12/29/2015 CLINICAL DATA:  Shortness of breath and productive cough. Bilateral lower extremity swelling. Chest tightness. EXAM: CHEST  2 VIEW COMPARISON:  PA and lateral chest 03/23/2015 and 12/29/2014. FINDINGS: The patient is status post CABG. Heart size is upper normal. No pneumothorax or pleural effusion. IMPRESSION: No acute disease. Electronically Signed   By: Inge Rise M.D.   On: 12/29/2015 14:09    EKG: Independently reviewed. Sinus with few premature complexes  Assessment/Plan Active Problems:   CKD (chronic kidney disease) stage 3, GFR 30-59 ml/min   DM type 2, uncontrolled, with renal complications (HCC)   Essential hypertension   Acute on chronic systolic and diastolic heart failure, NYHA class 1 (HCC)   CAD (coronary artery disease)   Bilateral lower extremity edema   Asthma, chronic, mild intermittent, with acute exacerbation    Anemia - will repeat H/H - transfuse 2 units PRBC - will repeat post transfusion CBC  Acute on chronic systolic and diastolic heart failure - will continue home medications  - IV lasix 18m x 1 ordered - BNP of 574.0 - will cycle troponins - can consider repeat echocardiogram - edema to mid thigh bilaterally  but no appreciable rales on exam  DM Type 2 - continue home insulin regimen - last HgA1c was 1 week ago and was 7.4 - CBG monitoring ACHS   CKD Stage 3 - will have to watch Cr with IV lasix - only slightly elevated from lab draw last week - repeat BMP in am - patient has outpatient nephrologist  (appointment supposed to be tomorrow)  Asthma - will continue patient's home inhalers - some wheezing noted on exam    DVT prophylaxis: SCD's and lovenox Code Status: Full code  Family Communication: daughter is bedside, all questions answered  Disposition Plan: home when H/H stable and diuresed  Consults called: none  Admission status: inpatient    Loretha Stapler MD [facility] Pager [phone]  If 7PM-7AM, please contact night-coverage [email or web address] Password [email or web address]  12/29/2015, 5:42 PM

## 2015-12-29 NOTE — ED Notes (Signed)
When RN entered room, found pt had ambulated to the bathroom w/o assistance.  Pt asked for dinner which was ordered. No complaints at this time.

## 2015-12-29 NOTE — ED Notes (Signed)
Attempted to call report

## 2015-12-29 NOTE — ED Notes (Signed)
Attempted report 

## 2015-12-29 NOTE — Telephone Encounter (Signed)
Spoke with pt who is reporting a greater than 5 lb weigh gain in the last few days, he has increase in edema at feet/ankles, increased SOB and is unable to lay flat.  He is not sleeping and has no energy or appetite. With his HX of elevates BUN/Crea and the extent of his s/s he was advised to report to the ED for further evaluation and treatment.  Pt is in agreement.

## 2015-12-29 NOTE — ED Notes (Signed)
Ordered dinner tray.  

## 2015-12-29 NOTE — Telephone Encounter (Signed)
New Message  Pt call requesting to speak with RN. Pt states [name] gave him a chart and he says he is hitting the yellow mark on the chart for all categories . Pt states he is going to go to the hospital, but would like to speak with RN. Please call back to discuss

## 2015-12-30 DIAGNOSIS — D649 Anemia, unspecified: Secondary | ICD-10-CM

## 2015-12-30 LAB — BASIC METABOLIC PANEL
Anion gap: 7 (ref 5–15)
BUN: 36 mg/dL — AB (ref 6–20)
CALCIUM: 8.7 mg/dL — AB (ref 8.9–10.3)
CHLORIDE: 109 mmol/L (ref 101–111)
CO2: 24 mmol/L (ref 22–32)
CREATININE: 1.79 mg/dL — AB (ref 0.61–1.24)
GFR calc Af Amer: 43 mL/min — ABNORMAL LOW (ref >60)
GFR calc non Af Amer: 38 mL/min — ABNORMAL LOW (ref >60)
Glucose, Bld: 133 mg/dL — ABNORMAL HIGH (ref 65–99)
Potassium: 4.6 mmol/L (ref 3.5–5.1)
SODIUM: 140 mmol/L (ref 135–145)

## 2015-12-30 LAB — CBC
HEMATOCRIT: 19.1 % — AB (ref 39.0–52.0)
HEMATOCRIT: 25.1 % — AB (ref 39.0–52.0)
HEMOGLOBIN: 6.3 g/dL — AB (ref 13.0–17.0)
HEMOGLOBIN: 8.3 g/dL — AB (ref 13.0–17.0)
MCH: 29.6 pg (ref 26.0–34.0)
MCH: 29.9 pg (ref 26.0–34.0)
MCHC: 33.1 g/dL (ref 30.0–36.0)
MCHC: 33 g/dL (ref 30.0–36.0)
MCV: 89.6 fL (ref 78.0–100.0)
MCV: 90.5 fL (ref 78.0–100.0)
Platelets: 271 10*3/uL (ref 150–400)
Platelets: 274 10*3/uL (ref 150–400)
RBC: 2.11 MIL/uL — ABNORMAL LOW (ref 4.22–5.81)
RBC: 2.8 MIL/uL — AB (ref 4.22–5.81)
RDW: 13.7 % (ref 11.5–15.5)
RDW: 14.1 % (ref 11.5–15.5)
WBC: 9.9 10*3/uL (ref 4.0–10.5)
WBC: 6.8 10*3/uL (ref 4.0–10.5)

## 2015-12-30 LAB — TROPONIN I
TROPONIN I: 0.07 ng/mL — AB (ref <0.03)
TROPONIN I: 0.08 ng/mL — AB (ref <0.03)
TROPONIN I: 0.08 ng/mL — AB (ref <0.03)
Troponin I: 0.07 ng/mL (ref <0.03)

## 2015-12-30 LAB — GLUCOSE, CAPILLARY
GLUCOSE-CAPILLARY: 116 mg/dL — AB (ref 65–99)
GLUCOSE-CAPILLARY: 130 mg/dL — AB (ref 65–99)
GLUCOSE-CAPILLARY: 144 mg/dL — AB (ref 65–99)
Glucose-Capillary: 101 mg/dL — ABNORMAL HIGH (ref 65–99)

## 2015-12-30 LAB — CREATININE, SERUM
Creatinine, Ser: 1.99 mg/dL — ABNORMAL HIGH (ref 0.61–1.24)
GFR, EST AFRICAN AMERICAN: 38 mL/min — AB (ref >60)
GFR, EST NON AFRICAN AMERICAN: 33 mL/min — AB (ref >60)

## 2015-12-30 MED ORDER — LEVALBUTEROL HCL 1.25 MG/0.5ML IN NEBU
1.2500 mg | INHALATION_SOLUTION | Freq: Four times a day (QID) | RESPIRATORY_TRACT | Status: DC | PRN
  Administered 2015-12-30: 1.25 mg via RESPIRATORY_TRACT
  Filled 2015-12-30: qty 0.5

## 2015-12-30 MED ORDER — FUROSEMIDE 10 MG/ML IJ SOLN
20.0000 mg | Freq: Two times a day (BID) | INTRAMUSCULAR | Status: DC
  Administered 2015-12-30 – 2015-12-31 (×3): 20 mg via INTRAVENOUS
  Filled 2015-12-30 (×3): qty 2

## 2015-12-30 NOTE — Progress Notes (Signed)
Patient admitted, walked from ED bed into his room, no complaints of pain or dyspnea. Patient called wife. Cardiac monitoring applied (CCMD called). Patient consent for blood transfusion signed.

## 2015-12-30 NOTE — Progress Notes (Signed)
PROGRESS NOTE [facility]   Henry Carroll   GSU:[id number] DOB: 01/08/1951  DOA: 12/29/2015 PCP: Henry Cranker, DO   Brief Narrative:  65 year old male with history of CHF, CAD status post CABG, diabetes, CK-MB stage III presented with shortness of breath and weight gain about 12 pounds in the past week. Patient also had swelling of the ankles and feet have been getting worse over the past 2 weeks. Patient was found to be anemic in the ED about 6.5. Patient was also found to be in fluid overload. IV Lasix were given. Patient was transfused 2 units of PRBCs. She now be managed for CHF with aggressive diuresis.  Subjective: Patient seen and examined this morning, just finishing her second unit of PRBCs. Shortness of breath has improved. Denies chest pain and palpitation. Still some edema up to his right knee, weight continues to go up.  Assessment & Plan: Anemia of chronic disease likely secondary to CKD. Status post 2 units of PRBCs. Hemoglobin back up to baseline 8.3, FOB was negative a week ago, no signs of gross bleeding Anemia panel Monitor CBC  Acute on chronic systolic and diastolic heart failure - mild fluid overload likely secondary to blood transfusion - edema to some his right leg up to the knee. Mild crackles bibasilar. Started on IV Lasix Repeat checks x-ray in the morning. Daily Weights Strict I/O's Low salt diet Will monitor diuresis overnight and if improving can be discharged tomorrow in the morning.  DM Type 2 - stable continue home insulin regimen last HgA1c was 1 week ago and was 7.4 CBG monitoring ACHS  CKD Stage 3 - rattling trending down this probably was related to hypoperfusion. We need to monitor now that isn't IV Lasix. Repeat BMP in the morning.  Asthma - some weakness noted on exam. Will give Xopenex. Continue home medications.  DVT prophylaxis: SCD's Code Status: Full Family Communication: None at bedside Disposition Plan: will  monitor well on diuresis tonight if significantly improve and be discharged tomorrow  Consultants:   None   Procedures: None  Antimicrobials:  None   Objective: Vitals:   12/30/15 0458 12/30/15 0543 12/30/15 0749 12/30/15 1122  BP: (!) 164/82  138/75 (!) 154/77  Pulse:   69 89  Resp:   16 16  Temp:   98.3 F (36.8 C) 98.1 F (36.7 C)  TempSrc:   Oral Oral  SpO2:   98% 98%  Weight:  111.4 kg (245 lb 11.2 oz)    Height:        Intake/Output Summary (Last 24 hours) at 12/30/15 1450 Last data filed at 12/30/15 1031  Gross per 24 hour  Intake             1917 ml  Output             1000 ml  Net              917 ml   Filed Weights   12/29/15 1317 12/29/15 2028 12/30/15 0543  Weight: 114.3 kg (252 lb) 111.9 kg (246 lb 9.6 oz) 111.4 kg (245 lb 11.2 oz)    Examination:  General exam: Appears calm and comfortable  HEENT: AC/AT, PERRLA, OP moist and clear Respiratory system: CAir entry, wheezing on the bilateral lateral upper lobes, right base rales. Cardiovascular system: S1 & S2 heard, RRR. No JVD, murmurs, rubs or gallops Gastrointestinal system: Abdomen is nondistended, soft and nontender. Normal bowel sounds heard. Central nervous system: Alert and oriented. No focal  neurological deficits. Extremities: 2+ pitting edema bilaterally up to the knee.    Skin: No rashes, lesions or ulcers  Data Reviewed: I have personally reviewed following labs and imaging studies  CBC:  Recent Labs Lab 12/29/15 1328 12/29/15 2331 12/30/15 1005  WBC 5.8 6.8 9.9  HGB 6.5* 6.3* 8.3*  HCT 19.6* 19.1* 25.1*  MCV 90.3 90.5 89.6  PLT 304 271 709   Basic Metabolic Panel:  Recent Labs Lab 12/29/15 1328 12/29/15 2331 12/30/15 1005  NA 136  --  140  K 4.7  --  4.6  CL 106  --  109  CO2 22  --  24  GLUCOSE 235*  --  133*  BUN 38*  --  36*  CREATININE 2.14* 1.99* 1.79*  CALCIUM 8.7*  --  8.7*   GFR: Estimated Creatinine Clearance: 52.4 mL/min (by C-G formula based on SCr  of 1.79 mg/dL (H)). Liver Function Tests: No results for input(s): AST, ALT, ALKPHOS, BILITOT, PROT, ALBUMIN in the last 168 hours. No results for input(s): LIPASE, AMYLASE in the last 168 hours. No results for input(s): AMMONIA in the last 168 hours. Coagulation Profile: No results for input(s): INR, PROTIME in the last 168 hours. Cardiac Enzymes:  Recent Labs Lab 12/29/15 1846 12/29/15 2331 12/30/15 1005 12/30/15 1130  TROPONINI 0.07* 0.07* 0.07* 0.08*   BNP (last 3 results) No results for input(s): PROBNP in the last 8760 hours. HbA1C: No results for input(s): HGBA1C in the last 72 hours. CBG:  Recent Labs Lab 12/29/15 2124 12/30/15 0615 12/30/15 1120  GLUCAP 114* 101* 116*   Lipid Profile: No results for input(s): CHOL, HDL, LDLCALC, TRIG, CHOLHDL, LDLDIRECT in the last 72 hours. Thyroid Function Tests: No results for input(s): TSH, T4TOTAL, FREET4, T3FREE, THYROIDAB in the last 72 hours. Anemia Panel: No results for input(s): VITAMINB12, FOLATE, FERRITIN, TIBC, IRON, RETICCTPCT in the last 72 hours. Sepsis Labs: No results for input(s): PROCALCITON, LATICACIDVEN in the last 168 hours.  Recent Results (from the past 240 hour(s))  Fecal occult blood, imunochemical     Status: None   Collection Time: 12/22/15  4:56 PM  Result Value Ref Range Status   Fecal Occult Blood NEG Negative Final    Comment:   The testing platform for fecal occult blood testing is changing from Hemosure iFOB to InSure FIT testing. The StockClerk number for the [employer] is [id number]. For InSure FIT specimen requirements see order codes [phone]) Fecal Globin By Immunochemistry (Medicare). Hemosure iFOB testing will be phased out over the next few months and you will no longer be able to order [id number]) Fecal Occult Blood, Immunochemical (Medicare). If you have any questions, please contact your [facility] Account Representative directly, or call our Customer Service  Department at [phone].        Radiology Studies: Dg Chest 2 View  Result Date: 12/29/2015 CLINICAL DATA:  Shortness of breath and productive cough. Bilateral lower extremity swelling. Chest tightness. EXAM: CHEST  2 VIEW COMPARISON:  PA and lateral chest 03/23/2015 and 12/29/2014. FINDINGS: The patient is status post CABG. Heart size is upper normal. No pneumothorax or pleural effusion. IMPRESSION: No acute disease. Electronically Signed   By: Inge Rise M.D.   On: 12/29/2015 14:09     Scheduled Meds: . amLODipine  10 mg Oral Daily  . aspirin  325 mg Oral Daily  . atorvastatin  40 mg Oral QPM  . benazepril  20 mg Oral Daily  . carvedilol  12.5 mg Oral BID  WC  . enoxaparin (LOVENOX) injection  40 mg Subcutaneous Q24H  . furosemide  20 mg Intravenous BID  . hydrALAZINE  25 mg Oral BID  . insulin detemir  60 Units Subcutaneous BID  . mometasone-formoterol  2 puff Inhalation BID  . montelukast  10 mg Oral QHS  . sodium chloride flush  3 mL Intravenous Q12H   Continuous Infusions:   LOS: 1 day    Chipper Oman, MD [facility] Pager [phone]  If 7PM-7AM, please contact night-coverage [email or web address] Password [email or web address] 12/30/2015, 2:50 PM

## 2015-12-31 DIAGNOSIS — I10 Essential (primary) hypertension: Secondary | ICD-10-CM

## 2015-12-31 DIAGNOSIS — N183 Chronic kidney disease, stage 3 (moderate): Secondary | ICD-10-CM

## 2015-12-31 LAB — CBC
HEMATOCRIT: 27 % — AB (ref 39.0–52.0)
HEMOGLOBIN: 8.9 g/dL — AB (ref 13.0–17.0)
MCH: 29.5 pg (ref 26.0–34.0)
MCHC: 33 g/dL (ref 30.0–36.0)
MCV: 89.4 fL (ref 78.0–100.0)
Platelets: 303 10*3/uL (ref 150–400)
RBC: 3.02 MIL/uL — AB (ref 4.22–5.81)
RDW: 13.8 % (ref 11.5–15.5)
WBC: 6.9 10*3/uL (ref 4.0–10.5)

## 2015-12-31 LAB — TYPE AND SCREEN
ABO/RH(D): A POS
ANTIBODY SCREEN: NEGATIVE
Unit division: 0
Unit division: 0

## 2015-12-31 LAB — BASIC METABOLIC PANEL
ANION GAP: 8 (ref 5–15)
BUN: 36 mg/dL — ABNORMAL HIGH (ref 6–20)
CHLORIDE: 108 mmol/L (ref 101–111)
CO2: 23 mmol/L (ref 22–32)
Calcium: 8.7 mg/dL — ABNORMAL LOW (ref 8.9–10.3)
Creatinine, Ser: 1.56 mg/dL — ABNORMAL HIGH (ref 0.61–1.24)
GFR calc non Af Amer: 44 mL/min — ABNORMAL LOW (ref >60)
GFR, EST AFRICAN AMERICAN: 51 mL/min — AB (ref >60)
Glucose, Bld: 111 mg/dL — ABNORMAL HIGH (ref 65–99)
POTASSIUM: 4.4 mmol/L (ref 3.5–5.1)
SODIUM: 139 mmol/L (ref 135–145)

## 2015-12-31 LAB — GLUCOSE, CAPILLARY
GLUCOSE-CAPILLARY: 109 mg/dL — AB (ref 65–99)
Glucose-Capillary: 129 mg/dL — ABNORMAL HIGH (ref 65–99)

## 2015-12-31 LAB — IRON AND TIBC
IRON: 16 ug/dL — AB (ref 45–182)
SATURATION RATIOS: 4 % — AB (ref 17.9–39.5)
TIBC: 358 ug/dL (ref 250–450)
UIBC: 342 ug/dL

## 2015-12-31 LAB — RETICULOCYTES
RBC.: 3.02 MIL/uL — AB (ref 4.22–5.81)
Retic Count, Absolute: 72.5 10*3/uL (ref 19.0–186.0)
Retic Ct Pct: 2.4 % (ref 0.4–3.1)

## 2015-12-31 LAB — TROPONIN I
TROPONIN I: 0.07 ng/mL — AB (ref <0.03)
Troponin I: 0.08 ng/mL (ref <0.03)

## 2015-12-31 LAB — FOLATE: FOLATE: 16.1 ng/mL (ref >5.9)

## 2015-12-31 LAB — VITAMIN B12: VITAMIN B 12: 762 pg/mL (ref 180–914)

## 2015-12-31 LAB — FERRITIN: Ferritin: 30 ng/mL (ref 24–336)

## 2015-12-31 NOTE — Discharge Summary (Signed)
Physician Discharge Summary  Henry Carroll TGY:[id number] DOB: April 01, 1950 DOA: 12/29/2015  PCP: Gildardo Cranker, DO  Admit date: 12/29/2015 Discharge date: 12/31/2015  Admitted From: Home  Disposition:  Home   Recommendations for Outpatient Follow-up:  1. Follow up with PCP in 1 weeks, See cardiologist in 2 week 2. Please obtain BMP/CBC in one week  Discharge Condition: Stable CODE STATUS: FULL Diet recommendation: Heart Healthy / Carb Modified   Brief/Interim Summary: HPI: Henry Carroll is a 66 y.o. male with medical history significant of CHF, CAD s/p CABG, diabetes mellitus, renal failure (Stage III) who presents with weight gain- gained about 12 pounds in the past week and a half.  Has developed swelling in the ankles and feet that has been around for some time but got worse two days ago.  Started developing issues laying down- initially thought it was attributed to upper respiratory infection.  Last night developed a significant cough and had minimal energy.  Had an extreme dry cough that he describes as dry and hacking.  Developed a bit of confusion this morning which he states he mixed up days of doctors appointment.  Patient called his doctor and was endorsed to come into the Emergency department for further evaluation.  Patient states that he fairly consistently has swelling in his legs but not normally in his feet.  He was previously admitted last year for CHF and states that he had similar symptoms.  No history of chest pain, chest pressure. Can walk about 200 yards prior to getting short of breath.  No melena or hematochezia.  Has physical with PCP last week and was hemoccult negative.  No blood in urine.  Never received a blood transfusion.  Has appointment with nephrologist tomorrow.  Last echocardiogram was after his CABG in January and showed an EF of 45-50%.    No history of OSA.  Independent for all ADL's.  ED Course: Patient was seen and evaluated.  He received a  Duoneb and laboratory data was obtained.  He was found to have a hemoglobin of 6.5 (baseline of 8.5). BNP is currently pending.  Brief Narrative:  65 year old male with history of CHF, CAD status post CABG, diabetes, CK-MB stage III presented with shortness of breath and weight gain about 12 pounds in the past week. Patient also had swelling of the ankles and feet have been getting worse over the past 2 weeks. Patient was found to be anemic in the ED about 6.5. Patient was also found to be in fluid overload. IV Lasix were given. Patient was transfused 2 units of PRBCs. She now be managed for CHF with aggressive diuresis.  Subjective: Patient seen and examined this morning, just finishing her second unit of PRBCs. Shortness of breath has improved. Denies chest pain and palpitation. Still some edema up to his right knee, weight continues to go up.  Assessment & Plan: Anemia of chronic disease likely secondary to CKD. Status post 2 units of PRBCs. Hemoglobin back up to baseline 8.1, FOB was negative a week ago, no signs of gross bleeding Repeat CBC with PCP in 1 week  Acute on chronic systolic and diastolic heart failure - mild fluid overload likely secondary to blood transfusion - edema to some his right leg up to the knee. Mild crackles bibasilar. Started on IV Lasix and resolved with gentle diuresis, resume home lasix at discharge Weight down Strict I/O's Low salt diet   DM Type 2 - stable continue home insulin regimen last HgA1c was  1 week ago and was 7.4 CBG monitoring ACHS  CBG (last 3)   Recent Labs  12/30/15 1629 12/30/15 2045 12/31/15 0541  GLUCAP 130* 144* 129*    CKD Stage 3 - creatiine trending down this was probably was related to hypoperfusion. Repeat BMP showing improvement (see below).  Asthma - some weakness noted on exam. Continue home medications.  DVT prophylaxis: SCDs Code Status: Full Family Communication: None at bedside Disposition Plan: will  monitor well on diuresis tonight if significantly improve and be discharged tomorrow  Consultants:   None   Procedures: None  Antimicrobials:  None  Discharge Diagnoses:  Principal Problem:   Anemia Active Problems:   CKD (chronic kidney disease) stage 3, GFR 30-59 ml/min   DM type 2, uncontrolled, with renal complications (HCC)   Essential hypertension   Acute on chronic systolic and diastolic heart failure, NYHA class 1 (HCC)   CAD (coronary artery disease)   Bilateral lower extremity edema   Asthma, chronic, mild intermittent, with acute exacerbation  Discharge Instructions  Discharge Instructions    Increase activity slowly    Complete by:  As directed        Medication List    TAKE these medications   acetaminophen 500 MG tablet Commonly known as:  TYLENOL Take 1,000 mg by mouth 2 (two) times daily as needed for moderate pain.   amLODipine 10 MG tablet Commonly known as:  NORVASC TAKE ONE TABLET BY MOUTH ONCE DAILY What changed:  See the new instructions.   aspirin 325 MG EC tablet Take 1 tablet (325 mg total) by mouth daily.   atorvastatin 40 MG tablet Commonly known as:  LIPITOR Take 1 tablet (40 mg total) by mouth every evening. What changed:  when to take this   benazepril 40 MG tablet Commonly known as:  LOTENSIN TAKE ONE TABLET BY MOUTH ONCE DAILY FOR BLOOD PRESSURE What changed:  See the new instructions.   carvedilol 12.5 MG tablet Commonly known as:  COREG TAKE ONE TABLET BY MOUTH TWICE DAILY WITH A MEAL What changed:  See the new instructions.   Cholecalciferol 1000 units capsule Commonly known as:  [employer]S VITAMIN D3 Take 2 capsules (2,000 Units total) by mouth daily.   CINNAMON PO Take 1 capsule by mouth 2 (two) times daily.   CO Q 10 PO Take 200 mg by mouth 2 (two) times daily.   FLONASE 50 MCG/ACT nasal spray Generic drug:  fluticasone Place 2 sprays into both nostrils daily as needed for allergies or rhinitis (SEASONAL  ALLERGIES).   Fluticasone-Salmeterol 100-50 MCG/DOSE Aepb Commonly known as:  ADVAIR DISKUS Inhale 1 puff into the lungs 2 (two) times daily. What changed:  when to take this  Another medication with the same name was removed. Continue taking this medication, and follow the directions you see here.   furosemide 40 MG tablet Commonly known as:  LASIX Take 1 tablet (40 mg total) by mouth 2 (two) times daily.   hydrALAZINE 25 MG tablet Commonly known as:  APRESOLINE Take 25 mg by mouth 2 (two) times daily.   insulin aspart 100 UNIT/ML FlexPen Commonly known as:  NOVOLOG Take 5 units above 250, 10 units above 300. What changed:  how much to take  how to take this  when to take this  additional instructions   LEVEMIR 100 UNIT/ML injection Generic drug:  insulin detemir Inject 60 Units into the skin 2 (two) times daily.   montelukast 10 MG tablet Commonly known as:  SINGULAIR Take 1 tablet (10 mg total) by mouth at bedtime.   nystatin powder Commonly known as:  MYCOSTATIN/NYSTOP Apply topically 3 (three) times daily. To groin folds x 4 weeks or until healed   ONE TOUCH ULTRA TEST test strip Generic drug:  glucose blood USE ONE STRIP TO CHECK GLUCOSE THREE TIMES DAILY   ONE-A-DAY MENS 50+ ADVANTAGE Tabs Take 1 tablet by mouth daily.   PROAIR HFA 108 (90 Base) MCG/ACT inhaler Generic drug:  albuterol INHALE TWO PUFFS BY MOUTH EVERY 6 HOURS AS NEEDED FOR SHORTNESS OF BREATH   albuterol (2.5 MG/3ML) 0.083% nebulizer solution Commonly known as:  PROVENTIL USE ONE VIAL (3 ML) IN NEBULIZER EVERY 6 HOURS AS NEEDED FOR WHEEZING OR SHORTNESS OF BREATH. Dx:J45.21, R06.2   terbinafine 1 % cream Commonly known as:  LAMISIL Apply 1 application topically daily as needed (foot).   TRADJENTA 5 MG Tabs tablet Generic drug:  linagliptin TAKE ONE TABLET BY MOUTH ONCE DAILY What changed:  See the new instructions.      Follow-up Information    Gildardo Cranker, DO. Schedule  an appointment as soon as possible for a visit in 1 week(s).   Specialty:  Internal Medicine Why:  Hospital Follow Up Appointment  Contact information: [place] [address] [phone]        Candee Furbish, MD. Schedule an appointment as soon as possible for a visit in 2 week(s).   Specialty:  Cardiology Why:  Hospital Follow Up  Contact information: [address] [place] [address] [phone]          No Known Allergies  Procedures/Studies: Dg Chest 2 View  Result Date: 12/29/2015 CLINICAL DATA:  Shortness of breath and productive cough. Bilateral lower extremity swelling. Chest tightness. EXAM: CHEST  2 VIEW COMPARISON:  PA and lateral chest 03/23/2015 and 12/29/2014. FINDINGS: The patient is status post CABG. Heart size is upper normal. No pneumothorax or pleural effusion. IMPRESSION: No acute disease. Electronically Signed   By: Inge Rise M.D.   On: 12/29/2015 14:09   Dg Hip Unilat W Or W/o Pelvis 2-3 Views Left  Result Date: 12/05/2015 CLINICAL DATA:  Left hip pain for 3 days.  No trauma. EXAM: DG HIP (WITH OR WITHOUT PELVIS) 2-3V LEFT COMPARISON:  None. FINDINGS: AP view pelvis and two views of the left hip. AP view pelvis demonstrates lower lumbar and lumbosacral spondylosis. Femoral heads are located. No acute fracture. Vascular calcifications. Joint spaces are maintained for age. IMPRESSION: No acute osseous abnormality. Lower lumbar spondylosis. Advanced for age vascular calcifications. Electronically Signed   By: Abigail Miyamoto M.D.   On: 12/05/2015 11:02     Subjective: Pt says that he feels much better today,  He did diurese overnight.  He is sitting up in a chair.  He wants to go home.   Discharge Exam: Vitals:   12/30/15 2053 12/31/15 0542  BP: (!) 154/79 (!) 150/81  Pulse: 81 84  Resp: 17 18  Temp: 98.4 F (36.9 C) 97.5 F (36.4 C)   Vitals:   12/30/15 1910 12/30/15 2053 12/31/15 0542 12/31/15 0821  BP:   (!) 154/79 (!) 150/81   Pulse:  81 84   Resp:  17 18   Temp:  98.4 F (36.9 C) 97.5 F (36.4 C)   TempSrc:  Oral Oral   SpO2: 96% 99% 99% 98%  Weight:   111.4 kg (245 lb 8 oz)   Height:  General: Pt is alert, awake, not in acute distress Cardiovascular: S1/S2 +, no rubs, no gallops Respiratory: CTA bilaterally, no wheezing, no rhonchi Abdominal: Soft, NT, ND, bowel sounds + Extremities: trace pretibial edema, no cyanosis  The results of significant diagnostics from this hospitalization (including imaging, microbiology, ancillary and laboratory) are listed below for reference.     Microbiology: Recent Results (from the past 240 hour(s))  Fecal occult blood, imunochemical     Status: None   Collection Time: 12/22/15  4:56 PM  Result Value Ref Range Status   Fecal Occult Blood NEG Negative Final    Comment:   The testing platform for fecal occult blood testing is changing from Hemosure iFOB to InSure FIT testing. The StockClerk number for the [employer] is [id number]. For InSure FIT specimen requirements see order codes [phone]) Fecal Globin By Immunochemistry (Medicare). Hemosure iFOB testing will be phased out over the next few months and you will no longer be able to order [phone]) Fecal Occult Blood, Immunochemical (Medicare). If you have any questions, please contact your [facility] Account Representative directly, or call our Customer Service Department at [phone].        Labs: BNP (last 3 results)  Recent Labs  12/29/15 1328  BNP 834.1*   Basic Metabolic Panel:  Recent Labs Lab 12/29/15 1328 12/29/15 2331 12/30/15 1005 12/31/15 0636  NA 136  --  140 139  K 4.7  --  4.6 4.4  CL 106  --  109 108  CO2 22  --  24 23  GLUCOSE 235*  --  133* 111*  BUN 38*  --  36* 36*  CREATININE 2.14* 1.99* 1.79* 1.56*  CALCIUM 8.7*  --  8.7* 8.7*   Liver Function Tests: No results for input(s): AST, ALT, ALKPHOS, BILITOT, PROT, ALBUMIN in  the last 168 hours. No results for input(s): LIPASE, AMYLASE in the last 168 hours. No results for input(s): AMMONIA in the last 168 hours. CBC:  Recent Labs Lab 12/29/15 1328 12/29/15 2331 12/30/15 1005 12/31/15 0636  WBC 5.8 6.8 9.9 6.9  HGB 6.5* 6.3* 8.3* 8.9*  HCT 19.6* 19.1* 25.1* 27.0*  MCV 90.3 90.5 89.6 89.4  PLT 304 271 274 303   Cardiac Enzymes:  Recent Labs Lab 12/30/15 1005 12/30/15 1130 12/30/15 1835 12/30/15 2300 12/31/15 0636  TROPONINI 0.07* 0.08* 0.08* 0.08* 0.07*   BNP: Invalid input(s): POCBNP CBG:  Recent Labs Lab 12/30/15 0615 12/30/15 1120 12/30/15 1629 12/30/15 2045 12/31/15 0541  GLUCAP 101* 116* 130* 144* 129*   D-Dimer No results for input(s): DDIMER in the last 72 hours. Hgb A1c No results for input(s): HGBA1C in the last 72 hours. Lipid Profile No results for input(s): CHOL, HDL, LDLCALC, TRIG, CHOLHDL, LDLDIRECT in the last 72 hours. Thyroid function studies No results for input(s): TSH, T4TOTAL, T3FREE, THYROIDAB in the last 72 hours.  Invalid input(s): FREET3 Anemia work up  Recent Labs  12/31/15 0000 12/31/15 0636  VITAMINB12  --  762  FOLATE 16.1  --   FERRITIN  --  30  TIBC  --  358  IRON  --  16*  RETICCTPCT  --  2.4   Urinalysis    Component Value Date/Time   COLORURINE YELLOW 02/16/2015 [place] 02/16/2015 0852   LABSPEC 1.015 02/16/2015 0852   PHURINE 6.5 02/16/2015 0852   GLUCOSEU NEGATIVE 02/16/2015 0852   HGBUR NEGATIVE 02/16/2015 [place] NEGATIVE 02/16/2015 0852   KETONESUR NEGATIVE 02/16/2015 9622  PROTEINUR >300 (A) 02/16/2015 0852   NITRITE NEGATIVE 02/16/2015 0852   LEUKOCYTESUR NEGATIVE 02/16/2015 0852   Sepsis Labs Invalid input(s): PROCALCITONIN,  WBC,  LACTICIDVEN Microbiology Recent Results (from the past 240 hour(s))  Fecal occult blood, imunochemical     Status: None   Collection Time: 12/22/15  4:56 PM  Result Value Ref Range Status   Fecal Occult  Blood NEG Negative Final    Comment:   The testing platform for fecal occult blood testing is changing from Hemosure iFOB to InSure FIT testing. The StockClerk number for the [employer] is [id number]. For InSure FIT specimen requirements see order codes [phone]) Fecal Globin By Immunochemistry (Medicare). Hemosure iFOB testing will be phased out over the next few months and you will no longer be able to order [phone]) Fecal Occult Blood, Immunochemical (Medicare). If you have any questions, please contact your [facility] Account Representative directly, or call our Customer Service Department at [phone].      Time coordinating discharge: 32 minutes  SIGNED:  Irwin Brakeman, MD  [facility] 12/31/2015, 9:37 AM Pager   If 7PM-7AM, please contact night-coverage [email or web address] Password [email or web address]

## 2015-12-31 NOTE — Discharge Instructions (Signed)
Heart Failure  Heart failure means your heart has trouble pumping blood. This makes it hard for your body to work well. Heart failure is usually a long-term (chronic) condition. You must take good care of yourself and follow your doctor's treatment plan.  HOME CARE   Take your heart medicine as told by your doctor.    Do not stop taking medicine unless your doctor tells you to.    Do not skip any dose of medicine.    Refill your medicines before they run out.    Take other medicines only as told by your doctor or pharmacist.   Stay active if told by your doctor. The elderly and people with severe heart failure should talk with a doctor about physical activity.   Eat heart-healthy foods. Choose foods that are without trans fat and are low in saturated fat, cholesterol, and salt (sodium). This includes fresh or frozen fruits and vegetables, fish, lean meats, fat-free or low-fat dairy foods, whole grains, and high-fiber foods. Lentils and dried peas and beans (legumes) are also good choices.   Limit salt if told by your doctor.   Cook in a healthy way. Roast, grill, broil, bake, poach, steam, or stir-fry foods.   Limit fluids as told by your doctor.   Weigh yourself every morning. Do this after you pee (urinate) and before you eat breakfast. Write down your weight to give to your doctor.   Take your blood pressure and write it down if your doctor tells you to.   Ask your doctor how to check your pulse. Check your pulse as told.   Lose weight if told by your doctor.   Stop smoking or chewing tobacco. Do not use gum or patches that help you quit without your doctor's approval.   Schedule and go to doctor visits as told.   Nonpregnant women should have no more than 1 drink a day. Men should have no more than 2 drinks a day. Talk to your doctor about drinking alcohol.   Stop illegal drug use.   Stay current with shots (immunizations).   Manage your health conditions as told by your doctor.   Learn to  manage your stress.   Rest when you are tired.   If it is really hot outside:    Avoid intense activities.    Use air conditioning or fans, or get in a cooler place.    Avoid caffeine and alcohol.    Wear loose-fitting, lightweight, and light-colored clothing.   If it is really cold outside:    Avoid intense activities.    Layer your clothing.    Wear mittens or gloves, a hat, and a scarf when going outside.    Avoid alcohol.   Learn about heart failure and get support as needed.   Get help to maintain or improve your quality of life and your ability to care for yourself as needed.  GET HELP IF:    You gain weight quickly.   You are more short of breath than usual.   You cannot do your normal activities.   You tire easily.   You cough more than normal, especially with activity.   You have any or more puffiness (swelling) in areas such as your hands, feet, ankles, or belly (abdomen).   You cannot sleep because it is hard to breathe.   You feel like your heart is beating fast (palpitations).   You get dizzy or light-headed when you stand up.  GET HELP   are not doing well or get worse. This information is not intended to replace advice given to you by your health care provider. Make sure you discuss any questions you have with your health care provider. Document Released: 11/08/2007 Document Revised: 02/19/2014 Document Reviewed: 03/17/2012 Elsevier Interactive Patient Education  2017 [place].    Blood Glucose Monitoring, Adult Monitoring your blood sugar (glucose) helps you manage your diabetes. It also helps you and your health care provider determine how well your diabetes management  plan is working. Blood glucose monitoring involves checking your blood glucose as often as directed, and keeping a record (log) of your results over time. Why should I monitor my blood glucose? Checking your blood glucose regularly can:  Help you understand how food, exercise, illnesses, and medicines affect your blood glucose.  Let you know what your blood glucose is at any time. You can quickly tell if you are having low blood glucose (hypoglycemia) or high blood glucose (hyperglycemia).  Help you and your health care provider adjust your medicines as needed. When should I check my blood glucose? Follow instructions from your health care provider about how often to check your blood glucose. This may depend on:  The type of diabetes you have.  How well-controlled your diabetes is.  Medicines you are taking. If you have type 1 diabetes:  Check your blood glucose at least 2 times a day.  Also check your blood glucose:  Before every insulin injection.  Before and after exercise.  Between meals.  2 hours after a meal.  Occasionally between 2:00 a.m. and 3:00 a.m., as directed.  Before potentially dangerous tasks, like driving or using heavy machinery.  At bedtime.  You may need to check your blood glucose more often, up to 6-10 times a day:  If you use an insulin pump.  If you need multiple daily injections (MDI).  If your diabetes is not well-controlled.  If you are ill.  If you have a history of severe hypoglycemia.  If you have a history of not knowing when your blood glucose is getting low (hypoglycemia unawareness). If you have type 2 diabetes:  If you take insulin or other diabetes medicines, check your blood glucose at least 2 times a day.  If you are on intensive insulin therapy, check your blood glucose at least 4 times a day. Occasionally, you may also need to check between 2:00 a.m. and 3:00 a.m., as directed.  Also check your blood glucose:  Before  and after exercise.  Before potentially dangerous tasks, like driving or using heavy machinery.  You may need to check your blood glucose more often if:  Your medicine is being adjusted.  Your diabetes is not well-controlled.  You are ill. What is a blood glucose log?  A blood glucose log is a record of your blood glucose readings. It helps you and your health care provider:  Look for patterns in your blood glucose over time.  Adjust your diabetes management plan as needed.  Every time you check your blood glucose, write down your result and notes about things that may be affecting your blood glucose, such as your diet and exercise for the day.  Most glucose meters store a record of glucose readings in the meter. Some meters allow you to download your records to a computer. How do I check my blood glucose? Follow these steps to get accurate readings of your blood glucose: Supplies needed   Blood glucose meter.  Test strips for  your meter. Each meter has its own strips. You must use the strips that come with your meter.  A needle to prick your finger (lancet). Do not use lancets more than once.  A device that holds the lancet (lancing device).  A journal or log book to write down your results. Procedure  Wash your hands with soap and water.  Prick the side of your finger (not the tip) with the lancet. Use a different finger each time.  Gently rub the finger until a small drop of blood appears.  Follow instructions that come with your meter for inserting the test strip, applying blood to the strip, and using your blood glucose meter.  Write down your result and any notes. Alternative testing sites  Some meters allow you to use areas of your body other than your finger (alternative sites) to test your blood.  If you think you may have hypoglycemia, or if you have hypoglycemia unawareness, do not use alternative sites. Use your finger instead.  Alternative sites may  not be as accurate as the fingers, because blood flow is slower in these areas. This means that the result you get may be delayed, and it may be different from the result that you would get from your finger.  The most common alternative sites are:  Forearm.  Thigh.  Palm of the hand. Additional tips  Always keep your supplies with you.  If you have questions or need help, all blood glucose meters have a 24-hour hotline number that you can call. You may also contact your health care provider.  After you use a few boxes of test strips, adjust (calibrate) your blood glucose meter by following instructions that came with your meter. This information is not intended to replace advice given to you by your health care provider. Make sure you discuss any questions you have with your health care provider. Document Released: 02/01/2003 Document Revised: 08/19/2015 Document Reviewed: 07/11/2015 Elsevier Interactive Patient Education  2017 [employer].

## 2015-12-31 NOTE — Progress Notes (Signed)
Pt discharged home. IV and tele removed. Discharged instructions given questions answered. HF teaching completed.

## 2016-01-02 ENCOUNTER — Telehealth: Payer: Self-pay

## 2016-01-02 NOTE — Telephone Encounter (Signed)
I have made the 1st attempt to contact the patient or family member in charge, in order to follow up from recently being discharged from the hospital. I left a message on voicemail but I will make another attempt at a different time.  

## 2016-01-02 NOTE — Progress Notes (Addendum)
CARDIOLOGY OFFICE NOTE  Date:  01/03/2016    [name] Date of Birth: January 06, 1951 Medical Record #[id number]  PCP:  Gildardo Cranker, DO  Cardiologist:  [facility]    Chief Complaint  Patient presents with  . Congestive Heart Failure    Post hospital visit - seen for [name]    History of Present Illness: Henry Carroll is a 65 y.o. male who presents today for a post hospital visit. Seen for [name].   He has known CAD s/p 02/18/15 CABG 4 with LIMA to LAD, free right internal mammary graft to ramus, SVG to OM, SVG to PDA. 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 Mild to moderate MR. Prior nuclear stress test was intermediate risk showing a medium-sized severe basal to mid inferior inferolateral perfusion defect. EF on nuclear stress was 30%. Echo from February showed improvement with EF of 45 to 50%.   Seen 5 months ago and he was doing ok.   Admitted last week with shortness of breath and weight gain of 12 pounds - found to be anemic with a HGB of 6.5. Was transfused and diuresed. The anemia was felt to be related to CKD. No signs of gross bleeding. Was to follow up with PCP. Was not seen by cardiology during this stay.   Comes in today. Here alone. Sees his PCP later today. Still short of breath. Weight continues to climb. Back to his usual dose of lasix which he says is just 20 mg. He has had some polyps noted in the past - tells me his last colonoscopy (in [place]) was when he was 18 and it was "ok". Still with lots of swelling. Short of breath - can't really lie down without coughing. No real pain. Has had chills and night sweats chronically. Little bloated. Wondering about his pumping function.  Not sure about how much salt he gets. Tells me he is the primary caregiver for his wife and that she is in worse shape than him. Really does not wish to go  back to the hospital.   Past Medical History:  Diagnosis Date  . Anemia   . 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 Asthma   . Chronic combined systolic (congestive) and diastolic (congestive) heart failure    a. 12/31/14: 2D ECHO: EF 40-45%, HK of inf myocardium, G1DD, mod MR  . CKD (chronic kidney disease), stage III   . Coronary artery disease    a. LHC 01/2015 - triple vessel CAD (mod oLM, mLAD, severe mRCA, intermediate branch stenosis, CTO of mCx). Plan CABG 02/2015.  Marland Kitchen Hyperlipidemia   . Hypertension   . Mitral regurgitation    a. Mild-mod by echo 12/2014.  Marland Kitchen Myocardial infarction    pt. states per [name] he has in the past  . NSVT (nonsustained ventricular tachycardia) ([place])    a. 9 beats during 01/2015 adm. BB titrated.  . Obesity    a. BMI 33  . Type II diabetes mellitus ([place])     Past Surgical History:  Procedure Laterality Date  . BACK SURGERY    . CARDIAC CATHETERIZATION N/A 02/09/2015   Procedure: Left Heart Cath and Coronary Angiography;  Surgeon: Burnell Blanks, MD;  Location: [facility];  Service: Cardiovascular;  Laterality: N/A;  . COLONOSCOPY  "3-4 times"  . CORONARY ARTERY BYPASS GRAFT N/A 02/18/2015   Procedure: CORONARY ARTERY  BYPASS GRAFTING (CABG) x  four, using bilateral internal mammary arteries and right leg greater saphenous vein harvested endoscopically;  Surgeon: [name], MD;  Location: [facility];  Service: Open Heart Surgery;  Laterality: N/A;  . HERNIA REPAIR  [id number]   Umbilical  . LUMBAR [place] SURGERY  2006   L4 & L5  . TEE WITHOUT CARDIOVERSION N/A 02/18/2015   Procedure: TRANSESOPHAGEAL ECHOCARDIOGRAM (TEE);  Surgeon: [name], MD;  Location: [facility];  Service: Open Heart Surgery;  Laterality: N/A;  . TONSILLECTOMY  1962     Medications: Current Outpatient Prescriptions  Medication Sig Dispense Refill  . acetaminophen (TYLENOL) 500 MG tablet Take 1,000 mg by mouth 2 (two) times daily as needed for moderate pain.    Marland Kitchen  albuterol (PROVENTIL) (2.5 MG/3ML) 0.083% nebulizer solution USE ONE VIAL (3 ML) IN NEBULIZER EVERY 6 HOURS AS NEEDED FOR WHEEZING OR SHORTNESS OF BREATH. Dx:J45.21, R06.2 300 mL 11  . amLODipine (NORVASC) 10 MG tablet Take 0.5 tablets (5 mg total) by mouth daily. 30 tablet 2  . aspirin EC 325 MG EC tablet Take 1 tablet (325 mg total) by mouth daily.    Marland Kitchen atorvastatin (LIPITOR) 40 MG tablet Take 1 tablet (40 mg total) by mouth every evening. (Patient taking differently: Take 40 mg by mouth daily. ) 90 tablet 3  . benazepril (LOTENSIN) 40 MG tablet TAKE ONE TABLET BY MOUTH ONCE DAILY FOR BLOOD PRESSURE (Patient taking differently: No sig reported) 30 tablet 5  . carvedilol (COREG) 12.5 MG tablet TAKE ONE TABLET BY MOUTH TWICE DAILY WITH A MEAL (Patient taking differently: No sig reported) 60 tablet 8  . Cholecalciferol ([employer]S VITAMIN D3) 1000 UNITS capsule Take 2 capsules (2,000 Units total) by mouth daily. 60 capsule 3  . CINNAMON PO Take 1 capsule by mouth 2 (two) times daily.     . Coenzyme Q10 (CO Q 10 PO) Take 200 mg by mouth 2 (two) times daily.     . fluticasone (FLONASE) 50 MCG/ACT nasal spray Place 2 sprays into both nostrils daily as needed for allergies or rhinitis (SEASONAL ALLERGIES).    . Fluticasone-Salmeterol (ADVAIR DISKUS) 100-50 MCG/DOSE AEPB Inhale 1 puff into the lungs 2 (two) times daily. (Patient taking differently: Inhale 1 puff into the lungs daily. ) 60 each 0  . furosemide (LASIX) 40 MG tablet Take 1 tablet (40 mg total) by mouth 2 (two) times daily. 60 tablet 6  . hydrALAZINE (APRESOLINE) 25 MG tablet Take 25 mg by mouth 2 (two) times daily.    . insulin aspart (NOVOLOG) 100 UNIT/ML FlexPen Take 5 units above 250, 10 units above 300. (Patient taking differently: Inject 5-10 Units into the skin 2 (two) times daily before a meal. 5 units if BGL is 200 or greater and 10 units if 250 or greater) 15 mL 0  . insulin detemir (LEVEMIR) 100 UNIT/ML injection Inject 60 Units into the  skin 2 (two) times daily.     . montelukast (SINGULAIR) 10 MG tablet Take 1 tablet (10 mg total) by mouth at bedtime. 30 tablet 6  . Multiple Vitamins-Minerals (ONE-A-DAY MENS 50+ ADVANTAGE) TABS Take 1 tablet by mouth daily.    Marland Kitchen nystatin (MYCOSTATIN/NYSTOP) powder Apply topically 3 (three) times daily. To groin folds x 4 weeks or until healed 30 g 1  . ONE TOUCH ULTRA TEST test strip USE ONE STRIP TO CHECK GLUCOSE THREE TIMES DAILY 100 each 11  . PROAIR HFA 108 (90 Base) MCG/ACT inhaler INHALE  TWO PUFFS BY MOUTH EVERY 6 HOURS AS NEEDED FOR SHORTNESS OF BREATH 27 each 2  . terbinafine (LAMISIL) 1 % cream Apply 1 application topically daily as needed (foot).     . TRADJENTA 5 MG TABS tablet TAKE ONE TABLET BY MOUTH ONCE DAILY (Patient taking differently: No sig reported) 30 tablet 6  . metolazone (ZAROXOLYN) 2.5 MG tablet Take 1 tablet (2.5 mg total) by mouth daily. Take one pill 30 minutes prior to AM dose of Lasix for 3 days only and then STOP 10 tablet 3  . potassium chloride SA (KLOR-CON M20) 20 MEQ tablet Take 1 tablet (20 mEq total) by mouth daily. Take one pill daily for 3 days and then STOP 10 tablet 3   No current facility-administered medications for this visit.     Allergies: No Known Allergies  Social History: The patient  reports that he quit smoking about a year ago. His smoking use included Cigars. He quit after 17.00 years of use. He has never used smokeless tobacco. He reports that he drinks alcohol. He reports that he does not use drugs.   Family History: The patient's family history includes Diabetes in his mother; Heart disease in his father and mother; Hypertension in his father; Kidney disease in his daughter; Stroke in his sister; Sudden death in his daughter.   Review of Systems: Please see the history of present illness.   Otherwise, the review of systems is positive for none.   All other systems are reviewed and negative.   Physical Exam: VS:  BP 128/68   Pulse  73   Ht 6\' 1"  (1.854 m)   Wt 256 lb (116.1 kg)   SpO2 97% Comment: at rest  BMI 33.78 kg/m  .  BMI Body mass index is 33.78 kg/m.  Wt Readings from Last 3 Encounters:  01/03/16 256 lb (116.1 kg)  12/31/15 245 lb 8 oz (111.4 kg)  12/21/15 240 lb 12.8 oz (109.2 kg)    General: Pleasant. His color looks pale to me. His weight continues to climb. Up 9 more pounds since discharge.  HEENT: Normal.  Neck: Supple, no JVD, carotid bruits, or masses noted.  Cardiac: Regular rate and rhythm. His heart tones are distant. Over 2+ edema. Legs are pretty tight.  Respiratory:  Lungs with decreased breath sounds bilaterally with normal work of breathing.  GI: Soft and nontender.  MS: No deformity or atrophy. Gait and ROM intact.  Skin: Warm and dry. Color is normal.  Neuro:  Strength and sensation are intact and no gross focal deficits noted.  Psych: Alert, appropriate and with normal affect.   LABORATORY DATA:  EKG:  EKG is not ordered today.  Lab Results  Component Value Date   WBC 6.9 12/31/2015   HGB 8.9 (L) 12/31/2015   HCT 27.0 (L) 12/31/2015   PLT 303 12/31/2015   GLUCOSE 111 (H) 12/31/2015   CHOL 153 12/19/2015   TRIG 128 12/19/2015   HDL 49 12/19/2015   LDLCALC 78 12/19/2015   ALT 19 12/19/2015   AST 22 12/19/2015   NA 139 12/31/2015   K 4.4 12/31/2015   CL 108 12/31/2015   CREATININE 1.56 (H) 12/31/2015   BUN 36 (H) 12/31/2015   CO2 23 12/31/2015   TSH 0.94 12/19/2015   PSA 0.8 12/19/2015   INR 1.44 02/18/2015   HGBA1C 7.4 (H) 12/19/2015   MICROALBUR 72.1 09/16/2015    BNP (last 3 results)  Recent Labs  12/29/15 1328  BNP 574.0*  ProBNP (last 3 results) No results for input(s): PROBNP in the last 8760 hours.   Other Studies Reviewed Today:  Echo Study Conclusions from 03/2015  - Left ventricle: The cavity size was normal. Systolic function was   mildly reduced. The estimated ejection fraction was in the range   of 45% to 50%. Doppler parameters are  consistent with abnormal   left ventricular relaxation (grade 1 diastolic dysfunction). Mild   concentric and moderate focal basal septal hypertrophy. - Regional wall motion abnormality: Mild hypokinesis of the mid   anteroseptal, mid inferoseptal, mid inferior, and apical   myocardium. - Ventricular septum: Septal motion showed abnormal function and   dyssynergy. These changes are consistent with a post-thoracotomy   state. - Aortic valve: Moderately calcified annulus. Trileaflet. - Mitral valve: Calcified annulus. Normal thickness leaflets . - Left atrium: The atrium was mildly dilated. - Right ventricle: Systolic function grossly appears to be low   normal to mildly reduced.  Assessment/Plan: 1. Profound anemia - presumed secondary to CKD - probably needs GI work up.   2. Mild systolic heart failure - exacerbated by profound anemia. Continues to have evidence of volume overload on exam and is symptomatic. Does not wish to be readmitted. He is not sure about dose of Lasix - needs to be 40 mg BID. Adding Zaroxolyn 2.5 mg for 3 days with potassium 20 meq daily. Cutting Norvasc in half as well. Needs limited echo - I am worried that his EF has dropped. STAT labs today.   3. CAD - no active chest pain  4. CKD - recheck labs today.   5. HTN - Norvasc cut in half.   Current medicines are reviewed with the patient today.  The patient does not have concerns regarding medicines other than what has been noted above.  The following changes have been made:  See above.  Labs/ tests ordered today include:    Orders Placed This Encounter  Procedures  . Basic metabolic panel  . CBC  . Brain natriuretic peptide  . ECHOCARDIOGRAM LIMITED     Disposition:   FU with me on Monday.   Patient is agreeable to this plan and will call if any problems develop in the interim.   Signed: Burtis Junes, RN, ANP-C 01/03/2016 8:33 AM  [facility] [address] [place], [place]  [address] Phone: [phone] Fax: [phone]      Addendum:  Preliminary limited echo with EF 40 to 45%, mild MR, RV dilatation, mild to moderate pulmonary HTN, moderate TR, biatrial enlargement and mild dilation of the aorta.   Burtis Junes, RN, [facility] [address] [place], [place]  [address] [phone]

## 2016-01-02 NOTE — Telephone Encounter (Signed)
Transition Care Management Follow-Up Telephone Call   Date discharged and where: 12/31/15; [facility]  How have you been since you were released from the hospital? Doing better; still having trouble sleeping and still have a dry cough.   Any patient concerns? None voiced  Items Reviewed:   Meds: Y  Allergies: Y  Dietary Changes Reviewed:  Y   Confirmed importance and Date/Time of follow-up visits scheduled: Yes; Sherrie Mustache, 01/03/16 @ 2:45pm (no appts avail w/ Dr. Eulas Post) ; Pt also sees his cardiologist 01/03/16 @ 8am.    Confirmed with patient if condition worsens to call PCP or go to the Emergency Dept. Patient was given office number and encouraged to call back with questions or concerns: Yes

## 2016-01-03 ENCOUNTER — Encounter: Payer: Self-pay | Admitting: Nurse Practitioner

## 2016-01-03 ENCOUNTER — Ambulatory Visit (HOSPITAL_COMMUNITY): Payer: Medicare Other | Attending: Cardiovascular Disease

## 2016-01-03 ENCOUNTER — Ambulatory Visit (INDEPENDENT_AMBULATORY_CARE_PROVIDER_SITE_OTHER): Payer: Medicare Other | Admitting: Nurse Practitioner

## 2016-01-03 ENCOUNTER — Other Ambulatory Visit: Payer: Self-pay

## 2016-01-03 VITALS — BP 128/68 | HR 73 | Ht 73.0 in | Wt 256.0 lb

## 2016-01-03 VITALS — BP 158/70 | HR 86 | Temp 97.7°F | Ht 73.0 in | Wt 257.4 lb

## 2016-01-03 DIAGNOSIS — I2589 Other forms of chronic ischemic heart disease: Secondary | ICD-10-CM

## 2016-01-03 DIAGNOSIS — I77810 Thoracic aortic ectasia: Secondary | ICD-10-CM | POA: Insufficient documentation

## 2016-01-03 DIAGNOSIS — I10 Essential (primary) hypertension: Secondary | ICD-10-CM | POA: Diagnosis not present

## 2016-01-03 DIAGNOSIS — J4520 Mild intermittent asthma, uncomplicated: Secondary | ICD-10-CM | POA: Diagnosis not present

## 2016-01-03 DIAGNOSIS — I252 Old myocardial infarction: Secondary | ICD-10-CM | POA: Insufficient documentation

## 2016-01-03 DIAGNOSIS — N183 Chronic kidney disease, stage 3 unspecified: Secondary | ICD-10-CM

## 2016-01-03 DIAGNOSIS — I5042 Chronic combined systolic (congestive) and diastolic (congestive) heart failure: Secondary | ICD-10-CM | POA: Diagnosis not present

## 2016-01-03 DIAGNOSIS — I358 Other nonrheumatic aortic valve disorders: Secondary | ICD-10-CM | POA: Diagnosis not present

## 2016-01-03 DIAGNOSIS — I255 Ischemic cardiomyopathy: Secondary | ICD-10-CM | POA: Diagnosis not present

## 2016-01-03 DIAGNOSIS — I340 Nonrheumatic mitral (valve) insufficiency: Secondary | ICD-10-CM | POA: Insufficient documentation

## 2016-01-03 DIAGNOSIS — E669 Obesity, unspecified: Secondary | ICD-10-CM | POA: Insufficient documentation

## 2016-01-03 DIAGNOSIS — Z6834 Body mass index (BMI) 34.0-34.9, adult: Secondary | ICD-10-CM | POA: Diagnosis not present

## 2016-01-03 DIAGNOSIS — R0601 Orthopnea: Secondary | ICD-10-CM

## 2016-01-03 DIAGNOSIS — E119 Type 2 diabetes mellitus without complications: Secondary | ICD-10-CM | POA: Insufficient documentation

## 2016-01-03 DIAGNOSIS — R29898 Other symptoms and signs involving the musculoskeletal system: Secondary | ICD-10-CM | POA: Insufficient documentation

## 2016-01-03 DIAGNOSIS — I5022 Chronic systolic (congestive) heart failure: Secondary | ICD-10-CM | POA: Diagnosis not present

## 2016-01-03 DIAGNOSIS — E785 Hyperlipidemia, unspecified: Secondary | ICD-10-CM | POA: Diagnosis not present

## 2016-01-03 DIAGNOSIS — I071 Rheumatic tricuspid insufficiency: Secondary | ICD-10-CM | POA: Diagnosis not present

## 2016-01-03 DIAGNOSIS — I5021 Acute systolic (congestive) heart failure: Secondary | ICD-10-CM | POA: Insufficient documentation

## 2016-01-03 DIAGNOSIS — D649 Anemia, unspecified: Secondary | ICD-10-CM

## 2016-01-03 DIAGNOSIS — I110 Hypertensive heart disease with heart failure: Secondary | ICD-10-CM | POA: Insufficient documentation

## 2016-01-03 DIAGNOSIS — I2510 Atherosclerotic heart disease of native coronary artery without angina pectoris: Secondary | ICD-10-CM | POA: Insufficient documentation

## 2016-01-03 DIAGNOSIS — D631 Anemia in chronic kidney disease: Secondary | ICD-10-CM

## 2016-01-03 LAB — BASIC METABOLIC PANEL
BUN: 40 mg/dL — ABNORMAL HIGH (ref 7–25)
CO2: 26 mmol/L (ref 20–31)
Calcium: 8.4 mg/dL — ABNORMAL LOW (ref 8.6–10.3)
Chloride: 107 mmol/L (ref 98–110)
Creat: 2.02 mg/dL — ABNORMAL HIGH (ref 0.70–1.25)
Glucose, Bld: 90 mg/dL (ref 65–99)
Potassium: 4.8 mmol/L (ref 3.5–5.3)
Sodium: 140 mmol/L (ref 135–146)

## 2016-01-03 LAB — CBC
HCT: 25.1 % — ABNORMAL LOW (ref 38.5–50.0)
Hemoglobin: 8 g/dL — ABNORMAL LOW (ref 13.2–17.1)
MCH: 29.4 pg (ref 27.0–33.0)
MCHC: 31.9 g/dL — ABNORMAL LOW (ref 32.0–36.0)
MCV: 92.3 fL (ref 80.0–100.0)
MPV: 8.9 fL (ref 7.5–12.5)
Platelets: 285 10*3/uL (ref 140–400)
RBC: 2.72 MIL/uL — ABNORMAL LOW (ref 4.20–5.80)
RDW: 14.2 % (ref 11.0–15.0)
WBC: 5.8 10*3/uL (ref 3.8–10.8)

## 2016-01-03 LAB — BRAIN NATRIURETIC PEPTIDE: Brain Natriuretic Peptide: 646.1 pg/mL — ABNORMAL HIGH (ref <100)

## 2016-01-03 MED ORDER — POTASSIUM CHLORIDE CRYS ER 20 MEQ PO TBCR: 20.0000 meq | EXTENDED_RELEASE_TABLET | Freq: Every day | ORAL | 3 refills | Status: DC

## 2016-01-03 MED ORDER — METOLAZONE 2.5 MG PO TABS: 2.5000 mg | ORAL_TABLET | Freq: Every day | ORAL | 3 refills | Status: DC

## 2016-01-03 MED ORDER — FUROSEMIDE 40 MG PO TABS: 40.0000 mg | ORAL_TABLET | Freq: Two times a day (BID) | ORAL | 6 refills | Status: DC

## 2016-01-03 MED ORDER — AMLODIPINE BESYLATE 10 MG PO TABS: 5.0000 mg | ORAL_TABLET | Freq: Every day | ORAL | 2 refills | Status: DC

## 2016-01-03 NOTE — Progress Notes (Signed)
Careteam: Patient Care Team: Gildardo Cranker, DO as PCP - General (Internal Medicine) Jerline Pain, MD as Consulting Physician (Cardiology) Fleet Contras, MD as Consulting Physician (Nephrology)  No Known Allergies  Chief Complaint  Patient presents with  . Hospitalization Follow-up    [facility] Follow-up 12/29/15-12/30/15  . Sleeping Problem    Patient c/o trouble staying asleep      HPI: Patient is a 65 y.o. male seen in the office today for hospital follow up. pt with medical history significant of CHF, CAD s/p CABG, diabetes mellitus, renal failure (Stage III) who was hospitalized due to CHF exacerbation. Found to have 12 lb weight gain and hgb was low. Given 2 units PRBC and diuresed.  When he left the hospital shortness of breath had improved a lot, currently improving. He was discharge from the hospital with lasix 20 mg daily after he has been to the cardiologist today for follow up his dose was increase to lasix 40 mg by mouth BID and to take zaroxolyn.  Weight has been slowly increasing since he has left the hospital.  conts to have significant leg swelling.  Norvasc was decreased to 5 mg daily from 10 mg.  Pt already has follow up scheduled for Monday 11/27, if weight has not significantly improved will go back to hospital.  Pt missed nephrology follow up due to hospitalization, rescheduled for 01/16/16  Having trouble staying asleep. This has improved. Since earlier this week. Nose is stopped up. Better when he sits up.     Review of Systems:  Review of Systems  Constitutional: Negative for activity change, appetite change and fatigue.  Respiratory: Positive for cough. Negative for chest tightness, shortness of breath and wheezing.   Cardiovascular: Positive for leg swelling. Negative for chest pain.  Gastrointestinal: Negative for constipation and diarrhea.  Genitourinary: Negative for difficulty urinating.  Musculoskeletal: Negative for arthralgias.    Neurological: Negative for dizziness.  Psychiatric/Behavioral: Positive for sleep disturbance.    Past Medical History:  Diagnosis Date  . Anemia   . 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 Asthma   . Chronic combined systolic (congestive) and diastolic (congestive) heart failure    a. 12/31/14: 2D ECHO: EF 40-45%, HK of inf myocardium, G1DD, mod MR  . CKD (chronic kidney disease), stage III   . Coronary artery disease    a. LHC 01/2015 - triple vessel CAD (mod oLM, mLAD, severe mRCA, intermediate branch stenosis, CTO of mCx). Plan CABG 02/2015.  Marland Kitchen Hyperlipidemia   . Hypertension   . Mitral regurgitation    a. Mild-mod by echo 12/2014.  Marland Kitchen Myocardial infarction    pt. states per [name] he has in the past  . NSVT (nonsustained ventricular tachycardia) ([place])    a. 9 beats during 01/2015 adm. BB titrated.  . Obesity    a. BMI 33  . Type II diabetes mellitus ([place])    Past Surgical History:  Procedure Laterality Date  . BACK SURGERY    . CARDIAC CATHETERIZATION N/A 02/09/2015   Procedure: Left Heart Cath and Coronary Angiography;  Surgeon: Burnell Blanks, MD;  Location: [facility];  Service: Cardiovascular;  Laterality: N/A;  . COLONOSCOPY  "3-4 times"  . CORONARY ARTERY BYPASS GRAFT N/A 02/18/2015   Procedure: CORONARY ARTERY BYPASS GRAFTING (CABG) x  four, using bilateral internal mammary arteries and right leg greater saphenous vein harvested endoscopically;  Surgeon: [name], MD;  Location: [facility];  Service:  Open Heart Surgery;  Laterality: N/A;  . HERNIA REPAIR  1950   Umbilical  . LUMBAR [place] SURGERY  2006   L4 & L5  . TEE WITHOUT CARDIOVERSION N/A 02/18/2015   Procedure: TRANSESOPHAGEAL ECHOCARDIOGRAM (TEE);  Surgeon: [name], MD;  Location: [facility];  Service: Open Heart Surgery;  Laterality: N/A;  . TONSILLECTOMY  1962   Social History:   reports that he quit smoking about a year ago. His smoking use included Cigars. He quit after 17.00 years of use.  He has never used smokeless tobacco. He reports that he drinks alcohol. He reports that he does not use drugs.  Family History  Problem Relation Age of Onset  . Diabetes Mother   . Heart disease Mother     later in life, age >57  . Heart disease Father     heart failure later in life  . Hypertension Father   . Stroke Sister   . Kidney disease Daughter   . Sudden death Daughter   . Heart attack Neg Hx     Medications: Patient's Medications  New Prescriptions   No medications on file  Previous Medications   ACETAMINOPHEN (TYLENOL) 500 MG TABLET    Take 1,000 mg by mouth 2 (two) times daily as needed for moderate pain.   ALBUTEROL (PROVENTIL) (2.5 MG/3ML) 0.083% NEBULIZER SOLUTION    USE ONE VIAL (3 ML) IN NEBULIZER EVERY 6 HOURS AS NEEDED FOR WHEEZING OR SHORTNESS OF BREATH. Dx:J45.21, R06.2   AMLODIPINE (NORVASC) 10 MG TABLET    Take 0.5 tablets (5 mg total) by mouth daily.   ASPIRIN EC 325 MG EC TABLET    Take 1 tablet (325 mg total) by mouth daily.   ATORVASTATIN (LIPITOR) 40 MG TABLET    Take 1 tablet (40 mg total) by mouth every evening.   BENAZEPRIL (LOTENSIN) 40 MG TABLET    TAKE ONE TABLET BY MOUTH ONCE DAILY FOR BLOOD PRESSURE   CARVEDILOL (COREG) 12.5 MG TABLET    TAKE ONE TABLET BY MOUTH TWICE DAILY WITH A MEAL   CHOLECALCIFEROL (CVS VITAMIN D3) 1000 UNITS CAPSULE    Take 2 capsules (2,000 Units total) by mouth daily.   CINNAMON PO    Take 1 capsule by mouth 2 (two) times daily.    COENZYME Q10 (CO Q 10 PO)    Take 200 mg by mouth 2 (two) times daily.    FLUTICASONE (FLONASE) 50 MCG/ACT NASAL SPRAY    Place 2 sprays into both nostrils daily as needed for allergies or rhinitis (SEASONAL ALLERGIES).   FLUTICASONE-SALMETEROL (ADVAIR DISKUS) 100-50 MCG/DOSE AEPB    Inhale 1 puff into the lungs 2 (two) times daily.   FUROSEMIDE (LASIX) 40 MG TABLET    Take 1 tablet (40 mg total) by mouth 2 (two) times daily.   HYDRALAZINE (APRESOLINE) 25 MG TABLET    Take 25 mg by mouth 2 (two)  times daily.   INSULIN ASPART (NOVOLOG) 100 UNIT/ML FLEXPEN    Take 5 units above 250, 10 units above 300.   INSULIN DETEMIR (LEVEMIR) 100 UNIT/ML INJECTION    Inject 60 Units into the skin 2 (two) times daily.    METOLAZONE (ZAROXOLYN) 2.5 MG TABLET    Take 1 tablet (2.5 mg total) by mouth daily. Take one pill 30 minutes prior to AM dose of Lasix for 3 days only and then STOP   MONTELUKAST (SINGULAIR) 10 MG TABLET    Take 1 tablet (10 mg total) by mouth  at bedtime.   MULTIPLE VITAMINS-MINERALS (ONE-A-DAY MENS 50+ ADVANTAGE) TABS    Take 1 tablet by mouth daily.   NYSTATIN (MYCOSTATIN/NYSTOP) POWDER    Apply topically 3 (three) times daily. To groin folds x 4 weeks or until healed   ONE TOUCH ULTRA TEST TEST STRIP    USE ONE STRIP TO CHECK GLUCOSE THREE TIMES DAILY   POTASSIUM CHLORIDE SA (KLOR-CON M20) 20 MEQ TABLET    Take 1 tablet (20 mEq total) by mouth daily. Take one pill daily for 3 days and then STOP   PROAIR HFA 108 (90 BASE) MCG/ACT INHALER    INHALE TWO PUFFS BY MOUTH EVERY 6 HOURS AS NEEDED FOR SHORTNESS OF BREATH   TERBINAFINE (LAMISIL) 1 % CREAM    Apply 1 application topically daily as needed (foot).    TRADJENTA 5 MG TABS TABLET    TAKE ONE TABLET BY MOUTH ONCE DAILY  Modified Medications   No medications on file  Discontinued Medications   No medications on file     Physical Exam:  Vitals:   01/03/16 1453  BP: (!) 158/70  Pulse: 86  Temp: 97.7 F (36.5 C)  TempSrc: Oral  SpO2: 98%  Weight: 257 lb 6.4 oz (116.8 kg)  Height: 6\' 1"  (1.854 m)   Body mass index is 33.96 kg/m.  Physical Exam  Constitutional: He is oriented to person, place, and time. He appears well-developed and well-nourished.  HENT:  Mouth/Throat: Oropharynx is clear and moist.  Eyes: Pupils are equal, round, and reactive to light. No scleral icterus.  Neck: Neck supple. Carotid bruit is not present. No thyromegaly present.  Cardiovascular: Normal rate, regular rhythm and intact distal pulses.   Exam reveals no gallop and no friction rub.   Murmur (1/6 SEM) heard. +3 pitting LE edema bilaterally  Pulmonary/Chest: Effort normal. He has no wheezes. He has no rales. He exhibits no tenderness.  Abdominal: Soft. Bowel sounds are normal. He exhibits no distension, no abdominal bruit, no pulsatile midline mass and no mass. There is no tenderness. There is no rebound and no guarding.  Musculoskeletal: He exhibits edema.  Lymphadenopathy:    He has no cervical adenopathy.  Neurological: He is alert and oriented to person, place, and time.  Skin: Skin is warm and dry. No rash noted.  Psychiatric: He has a normal mood and affect.    Labs reviewed: Basic Metabolic Panel:  Recent Labs  02/10/15 0306  02/18/15 2010  02/19/15 1610  12/19/15 1448 12/29/15 1328 12/29/15 2331 12/30/15 1005 12/31/15 0636  NA 140  < >  --   < >  --   < > 140 136  --  140 139  K 4.1  < >  --   < >  --   < > 4.9 4.7  --  4.6 4.4  CL 108  < >  --   < >  --   < > 105 106  --  109 108  CO2 25  < >  --   < >  --   < > 24 22  --  24 23  GLUCOSE 147*  < >  --   < >  --   < > 55* 235*  --  133* 111*  BUN 24*  < >  --   < >  --   < > 40* 38*  --  36* 36*  CREATININE 1.34*  < > 2.13*  < > 2.24*  < >  2.10* 2.14* 1.99* 1.79* 1.56*  CALCIUM 9.0  < >  --   < >  --   < > 9.0 8.7*  --  8.7* 8.7*  MG 2.1  --  3.5*  --  2.8*  --   --   --   --   --   --   TSH  --   --   --   --   --   --  0.94  --   --   --   --   < > = values in this interval not displayed. Liver Function Tests:  Recent Labs  02/10/15 0306 02/16/15 0852 06/01/15 1500 09/16/15 0910 12/19/15 1448  AST 22 26  --   --  22  ALT 24 25 18 26 19   ALKPHOS 70 75  --   --  61  BILITOT 0.3 0.3  --   --  0.2  PROT 5.6* 6.5  --   --  5.7*  ALBUMIN 3.1* 3.5  --   --  3.2*   No results for input(s): LIPASE, AMYLASE in the last 8760 hours. No results for input(s): AMMONIA in the last 8760 hours. CBC:  Recent Labs  02/03/15 1012  02/28/15 1245   12/30/15 1005 12/31/15 0636 01/03/16 0927  WBC 4.1  < > 8.4  < > 9.9 6.9 5.8  NEUTROABS 2.5  --  5.3  --   --   --   --   HGB 10.6*  < >  --   < > 8.3* 8.9* 8.0*  HCT 30.2*  < > 25.6*  < > 25.1* 27.0* 25.1*  MCV 88.6  < > 91  < > 89.6 89.4 92.3  PLT 250  < > 438*  < > 274 303 285  < > = values in this interval not displayed. Lipid Panel:  Recent Labs  06/01/15 1500 09/16/15 0916 12/19/15 1448  CHOL 149 149 153  HDL 45 56 49  LDLCALC 84 66 78  TRIG 100 133 128  CHOLHDL 3.3 2.7 3.1   TSH:  Recent Labs  12/19/15 1448  TSH 0.94   A1C: Lab Results  Component Value Date   HGBA1C 7.4 (H) 12/19/2015     Assessment/Plan 1. Chronic combined systolic and diastolic CHF (congestive heart failure) ([place]) Significant weight gain and edema since hospitalization.  Saw cardiologist today which lasix was increased to 40 mg BID and zaroxolyn added, following with cardiologist in 6 days  To follow up fluid status.   2. CKD (chronic kidney disease) stage 3, GFR 30-59 ml/min Missed nephrology follow up in hospital. Has already rescheduled this for 01/16/16. BMP taken today at cardiology office to follow up  3. Asthma, chronic, mild intermittent, uncomplicated Stable at this time. To cont advair and albuterol PRN  4. Anemia in stage 3 chronic kidney disease CBC drawn by cardiologist, hgb slightly worse since hospitalization. Follow up scheduled with nephrologist due to anemia in CKD  Finian Helvey K. Harle Battiest  [facility] [phone] 8 am - 5 pm) [phone] (after hours)

## 2016-01-03 NOTE — Patient Instructions (Addendum)
We will be checking the following labs today - STAT BNP, BMET and CBC   Medication Instructions:    Continue with your current medicines. BUT  I am cutting the Norvasc (Amlodipine) back to just 1/2 a pill daily (5mg )   Lasix (Furosemide) 40 mg to take twice a day - morning and early afternoon - this is at the drug store  Zaroxolyn 2.5 mg to take 30 minutes prior to dose of Lasix daily for 3 days - Tuesday, Wednesday, Thursday and then STOP - this is at the drug store  Potassium 20 meq to take for the next 3 days and then STOP - this is at the drug store    Testing/Procedures To Be Arranged:  Limited echocardiogram  Follow-Up:   See me on Monday   Other Special Instructions:   Restrict your salt   Weigh daily    If you need a refill on your cardiac medications before your next appointment, please call your pharmacy.   Call the [place] office at [phone] if you have any questions, problems or concerns.

## 2016-01-03 NOTE — Patient Instructions (Signed)
Cont recommendations by cardiologist

## 2016-01-04 LAB — ECHOCARDIOGRAM LIMITED
Height: 73 in
Weight: 4118.4 oz

## 2016-01-09 ENCOUNTER — Encounter: Payer: Self-pay | Admitting: Nurse Practitioner

## 2016-01-09 ENCOUNTER — Other Ambulatory Visit: Payer: Self-pay | Admitting: Internal Medicine

## 2016-01-09 ENCOUNTER — Ambulatory Visit (INDEPENDENT_AMBULATORY_CARE_PROVIDER_SITE_OTHER): Payer: Medicare Other | Admitting: Nurse Practitioner

## 2016-01-09 ENCOUNTER — Ambulatory Visit: Payer: Medicare Other | Admitting: Nurse Practitioner

## 2016-01-09 VITALS — BP 110/60 | HR 78 | Ht 73.0 in | Wt 240.8 lb

## 2016-01-09 DIAGNOSIS — I255 Ischemic cardiomyopathy: Secondary | ICD-10-CM

## 2016-01-09 DIAGNOSIS — I10 Essential (primary) hypertension: Secondary | ICD-10-CM | POA: Diagnosis not present

## 2016-01-09 DIAGNOSIS — N183 Chronic kidney disease, stage 3 unspecified: Secondary | ICD-10-CM

## 2016-01-09 DIAGNOSIS — I2589 Other forms of chronic ischemic heart disease: Secondary | ICD-10-CM

## 2016-01-09 DIAGNOSIS — D649 Anemia, unspecified: Secondary | ICD-10-CM

## 2016-01-09 DIAGNOSIS — I5022 Chronic systolic (congestive) heart failure: Secondary | ICD-10-CM | POA: Diagnosis not present

## 2016-01-09 LAB — CBC
HCT: 26.2 % — ABNORMAL LOW (ref 38.5–50.0)
Hemoglobin: 8.2 g/dL — ABNORMAL LOW (ref 13.2–17.1)
MCH: 28.8 pg (ref 27.0–33.0)
MCHC: 31.3 g/dL — ABNORMAL LOW (ref 32.0–36.0)
MCV: 91.9 fL (ref 80.0–100.0)
MPV: 8.7 fL (ref 7.5–12.5)
Platelets: 299 10*3/uL (ref 140–400)
RBC: 2.85 MIL/uL — ABNORMAL LOW (ref 4.20–5.80)
RDW: 13.8 % (ref 11.0–15.0)
WBC: 5.2 10*3/uL (ref 3.8–10.8)

## 2016-01-09 LAB — BASIC METABOLIC PANEL
BUN: 60 mg/dL — ABNORMAL HIGH (ref 7–25)
CO2: 26 mmol/L (ref 20–31)
Calcium: 8.5 mg/dL — ABNORMAL LOW (ref 8.6–10.3)
Chloride: 99 mmol/L (ref 98–110)
Creat: 2.18 mg/dL — ABNORMAL HIGH (ref 0.70–1.25)
Glucose, Bld: 371 mg/dL — ABNORMAL HIGH (ref 65–99)
Potassium: 4.7 mmol/L (ref 3.5–5.3)
Sodium: 134 mmol/L — ABNORMAL LOW (ref 135–146)

## 2016-01-09 LAB — IRON AND TIBC
%SAT: 12 % — ABNORMAL LOW (ref 15–60)
Iron: 40 ug/dL — ABNORMAL LOW (ref 50–180)
TIBC: 338 ug/dL (ref 250–425)
UIBC: 298 ug/dL (ref 125–400)

## 2016-01-09 LAB — BRAIN NATRIURETIC PEPTIDE: Brain Natriuretic Peptide: 199.2 pg/mL — ABNORMAL HIGH (ref <100)

## 2016-01-09 LAB — FERRITIN: Ferritin: 18 ng/mL — ABNORMAL LOW (ref 20–380)

## 2016-01-09 MED ORDER — FUROSEMIDE 40 MG PO TABS: 40.0000 mg | ORAL_TABLET | Freq: Two times a day (BID) | ORAL | 6 refills | Status: DC

## 2016-01-09 NOTE — Progress Notes (Signed)
CARDIOLOGY OFFICE NOTE  Date:  01/09/2016    Henry Carroll Date of Birth: 1950/05/15 Medical Record #[id number]  PCP:  Henry Cranker, DO  Cardiologist:  Henry Carroll    Chief Complaint  Patient presents with  . Cardiomyopathy  . Shortness of Breath    5 day check - seen for Dr. Marlou Carroll    History of Present Illness: Henry Carroll is a 65 y.o. male who presents today for a 5 day check. Seen for Dr. Marlou Carroll.   He has known CAD s/p 02/18/15 CABG 4 with LIMA to LAD, free right internal mammary graft to ramus, SVG to OM, SVG to PDA. 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 Mild to moderate MR. Prior nuclear stress test was intermediate risk showing a medium-sized severe basal to mid inferior inferolateral perfusion defect. EF on nuclear stress was 30%. Echo from February showed improvement with EF of 45 to 50%.   Seen 5 months ago and he was doing ok.   Admitted about 2 weeks ago with shortness of breath and weight Carroll of 12 pounds - found to be anemic with a HGB of 6.5. Was transfused and diuresed. The anemia was felt to be related to CKD. No signs of gross bleeding. Was to follow up with PCP. Was not seen by cardiology during this stay.   I then saw him last week for his post hospital visit - he was not doing well. Still short of breath/orthopneic. Weight continuing to climb. Looked anemic. Got a limited echo - EF 35 to 40%. Diuresed with short course of Zaroxolyn. Cut Norvasc in half.  Saw his PCP that day as well - does not look like he was referred back to GI. Discussed with Dr. Marlou Carroll - we both did not feel that his CKD was the cause for his hemoglobin of 6.   Comes in today. Here alone. He feels better - not back to his baseline - but better. Still short of breath if he exerts but no more orthopnea/PND. Weight is down 17 pounds. He weighed 231 back  in June when he was here to see Dr. Marlou Carroll but 240 just prior to that. His swelling has almost resolved. No chest pain. Easy fatigue. Stools do not look dark to him but he notes that back in October he had a spell of left upper leg pain and had dark stools then. He is anxious to find out the source of his anemia. He has seen Henry Carroll in [place] - he thinks about 4 years ago was the last time.   Past Medical History:  Diagnosis Date  . Anemia   . 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 Asthma   . Chronic combined systolic (congestive) and diastolic (congestive) heart failure    a. 12/31/14: 2D ECHO: EF 40-45%, HK of inf myocardium, G1DD, mod MR  . CKD (chronic kidney disease), stage III   . Coronary artery disease    a. LHC 01/2015 - triple vessel CAD (mod oLM, mLAD, severe mRCA, intermediate branch stenosis, CTO of mCx). Plan CABG 02/2015.  Marland Kitchen Hyperlipidemia   . Hypertension   . Mitral regurgitation    a. Mild-mod by echo 12/2014.  Marland Kitchen Myocardial infarction    pt. states per Dr. Cyndia Carroll he has in the past  . NSVT (nonsustained ventricular tachycardia) ([place])    a. 9 beats during 01/2015 adm.  BB titrated.  . Obesity    a. BMI 33  . Type II diabetes mellitus ([place])     Past Surgical History:  Procedure Laterality Date  . BACK SURGERY    . CARDIAC CATHETERIZATION N/A 02/09/2015   Procedure: Left Heart Cath and Coronary Angiography;  Surgeon: Burnell Blanks, MD;  Location: [facility];  Service: Cardiovascular;  Laterality: N/A;  . COLONOSCOPY  "3-4 times"  . CORONARY ARTERY BYPASS GRAFT N/A 02/18/2015   Procedure: CORONARY ARTERY BYPASS GRAFTING (CABG) x  four, using bilateral internal mammary arteries and right leg greater saphenous vein harvested endoscopically;  Surgeon: [name], MD;  Location: [facility];  Service: Open Heart Surgery;  Laterality: N/A;  . HERNIA REPAIR  3212   Umbilical  . LUMBAR [place] SURGERY  2006   L4 & L5  . TEE WITHOUT CARDIOVERSION N/A 02/18/2015    Procedure: TRANSESOPHAGEAL ECHOCARDIOGRAM (TEE);  Surgeon: [name], MD;  Location: [facility];  Service: Open Heart Surgery;  Laterality: N/A;  . TONSILLECTOMY  1962     Medications: Current Outpatient Prescriptions  Medication Sig Dispense Refill  . acetaminophen (TYLENOL) 500 MG tablet Take 1,000 mg by mouth 2 (two) times daily as needed for moderate pain.    Marland Kitchen albuterol (PROVENTIL) (2.5 MG/3ML) 0.083% nebulizer solution USE ONE VIAL (3 ML) IN NEBULIZER EVERY 6 HOURS AS NEEDED FOR WHEEZING OR SHORTNESS OF BREATH. Dx:J45.21, R06.2 300 mL 11  . amLODipine (NORVASC) 10 MG tablet Take 0.5 tablets (5 mg total) by mouth daily. 30 tablet 2  . aspirin EC 325 MG EC tablet Take 1 tablet (325 mg total) by mouth daily.    Marland Kitchen atorvastatin (LIPITOR) 40 MG tablet Take 1 tablet (40 mg total) by mouth every evening. 90 tablet 3  . benazepril (LOTENSIN) 40 MG tablet TAKE ONE TABLET BY MOUTH ONCE DAILY FOR BLOOD PRESSURE 30 tablet 5  . carvedilol (COREG) 12.5 MG tablet TAKE ONE TABLET BY MOUTH TWICE DAILY WITH A MEAL 60 tablet 8  . Cholecalciferol ([employer] VITAMIN D3) 1000 UNITS capsule Take 2 capsules (2,000 Units total) by mouth daily. 60 capsule 3  . CINNAMON PO Take 1 capsule by mouth 2 (two) times daily.     . Coenzyme Q10 (CO Q 10 PO) Take 200 mg by mouth 2 (two) times daily.     . fluticasone (FLONASE) 50 MCG/ACT nasal spray Place 2 sprays into both nostrils daily as needed for allergies or rhinitis (SEASONAL ALLERGIES).    . Fluticasone-Salmeterol (ADVAIR DISKUS) 100-50 MCG/DOSE AEPB Inhale 1 puff into the lungs 2 (two) times daily. 60 each 0  . furosemide (LASIX) 40 MG tablet Take 1 tablet (40 mg total) by mouth 2 (two) times daily. 60 tablet 6  . hydrALAZINE (APRESOLINE) 25 MG tablet Take 25 mg by mouth 2 (two) times daily.    . insulin aspart (NOVOLOG) 100 UNIT/ML FlexPen Take 5 units above 250, 10 units above 300. (Patient taking differently: Inject 5-10 Units into the skin 2 (two) times daily before a  meal. 5 units if BGL is 200 or greater and 10 units if 250 or greater) 15 mL 0  . insulin detemir (LEVEMIR) 100 UNIT/ML injection Inject 60 Units into the skin 2 (two) times daily.     . montelukast (SINGULAIR) 10 MG tablet Take 1 tablet (10 mg total) by mouth at bedtime. 30 tablet 6  . Multiple Vitamins-Minerals (ONE-A-DAY MENS 50+ ADVANTAGE) TABS Take 1 tablet by mouth daily.    Marland Kitchen  nystatin (MYCOSTATIN/NYSTOP) powder Apply topically 3 (three) times daily. To groin folds x 4 weeks or until healed 30 g 1  . ONE TOUCH ULTRA TEST test strip USE ONE STRIP TO CHECK GLUCOSE THREE TIMES DAILY 100 each 11  . potassium chloride SA (KLOR-CON M20) 20 MEQ tablet Take 1 tablet (20 mEq total) by mouth daily. Take one pill daily for 3 days and then STOP 10 tablet 3  . PROAIR HFA 108 (90 Base) MCG/ACT inhaler INHALE TWO PUFFS BY MOUTH EVERY 6 HOURS AS NEEDED FOR SHORTNESS OF BREATH 27 each 2  . terbinafine (LAMISIL) 1 % cream Apply 1 application topically daily as needed (foot).     . TRADJENTA 5 MG TABS tablet TAKE ONE TABLET BY MOUTH ONCE DAILY 30 tablet 6   No current facility-administered medications for this visit.     Allergies: No Known Allergies  Social History: The patient  reports that he quit smoking about a year ago. His smoking use included Cigars. He quit after 17.00 years of use. He has never used smokeless tobacco. He reports that he drinks alcohol. He reports that he does not use drugs.   Family History: The patient's family history includes Diabetes in his mother; Heart disease in his father and mother; Hypertension in his father; Kidney disease in his daughter; Stroke in his sister; Sudden death in his daughter.   Review of Systems: Please see the history of present illness.   Otherwise, the review of systems is positive for none.   All other systems are reviewed and negative.   Physical Exam: VS:  BP 110/60   Pulse 78   Ht 6\' 1"  (1.854 m)   Wt 240 lb 12.8 oz (109.2 kg)   SpO2 97%  Comment: at rest  BMI 31.77 kg/m  .  BMI Body mass index is 31.77 kg/m.  Wt Readings from Last 3 Encounters:  01/09/16 240 lb 12.8 oz (109.2 kg)  01/03/16 257 lb 6.4 oz (116.8 kg)  01/03/16 256 lb (116.1 kg)    General: Pleasant. Color still sallow. He is alert and in no acute distress.   HEENT: Normal.  Neck: Supple, no JVD, carotid bruits, or masses noted.  Cardiac: Regular rate and rhythm. No murmurs, rubs, or gallops. Just trace edema.  Respiratory:  Lungs are clear to auscultation bilaterally with normal work of breathing.  GI: Soft and nontender.  MS: No deformity or atrophy. Gait and ROM intact.  Skin: Warm and dry. Color is normal.  Neuro:  Strength and sensation are intact and no gross focal deficits noted.  Psych: Alert, appropriate and with normal affect.   LABORATORY DATA:  EKG:  EKG is not ordered today.  Lab Results  Component Value Date   WBC 5.8 01/03/2016   HGB 8.0 (L) 01/03/2016   HCT 25.1 (L) 01/03/2016   PLT 285 01/03/2016   GLUCOSE 90 01/03/2016   CHOL 153 12/19/2015   TRIG 128 12/19/2015   HDL 49 12/19/2015   LDLCALC 78 12/19/2015   ALT 19 12/19/2015   AST 22 12/19/2015   NA 140 01/03/2016   K 4.8 01/03/2016   CL 107 01/03/2016   CREATININE 2.02 (H) 01/03/2016   BUN 40 (H) 01/03/2016   CO2 26 01/03/2016   TSH 0.94 12/19/2015   PSA 0.8 12/19/2015   INR 1.44 02/18/2015   HGBA1C 7.4 (H) 12/19/2015   MICROALBUR 72.1 09/16/2015    BNP (last 3 results)  Recent Labs  12/29/15 1328 01/03/16 5277  BNP 574.0* 646.1*    ProBNP (last 3 results) No results for input(s): PROBNP in the last 8760 hours.   Other Studies Reviewed Today:  Limited Echo Study Conclusions from 12/2015  - Left ventricle: The cavity size was mildly dilated. Wall thickness was increased in a pattern of mild LVH. Systolic function was moderately reduced. The estimated ejection fraction was in the range of 35% to 40%. Diffuse hypokinesis. There is akinesis  of the mid-apicalinferolateral myocardium. Features are consistent with a pseudonormal left ventricular filling pattern, with concomitant abnormal relaxation and increased filling pressure (grade 2 diastolic dysfunction). - Aortic valve: Trileaflet; mildly thickened, mildly calcified leaflets. - Aorta: Ascending aortic diameter: 40 mm (S). - Ascending aorta: The ascending aorta was mildly dilated. - Mitral valve: There was mild regurgitation. - Left atrium: The atrium was mildly dilated. - Tricuspid valve: There was moderate regurgitation. - Pulmonary arteries: Systolic pressure was mildly increased. PA peak pressure: 42 mm Hg (S).   Echo Study Conclusions from 03/2015  - Left ventricle: The cavity size was normal. Systolic function was mildly reduced. The estimated ejection fraction was in the range of 45% to 50%. Doppler parameters are consistent with abnormal left ventricular relaxation (grade 1 diastolic dysfunction). Mild concentric and moderate focal basal septal hypertrophy. - Regional wall motion abnormality: Mild hypokinesis of the mid anteroseptal, mid inferoseptal, mid inferior, and apical myocardium. - Ventricular septum: Septal motion showed abnormal function and dyssynergy. These changes are consistent with a post-thoracotomy state. - Aortic valve: Moderately calcified annulus. Trileaflet. - Mitral valve: Calcified annulus. Normal thickness leaflets . - Left atrium: The atrium was mildly dilated. - Right ventricle: Systolic function grossly appears to be low normal to mildly reduced.  Assessment/Plan: 1. Profound anemia - this was presumed by the hospitalist to be secondary to CKD - Dr. Marlou Carroll and I feel this degree of anemia seems out of proportion. Will refer back to GI. Lab today. Transfuse if needed.    2. Mild systolic heart failure - exacerbated by profound anemia. His weight is down 17 pounds. Less symptoms but not totally  resolved. [place] lab today. No more zaroxolyn. Will continue Lasix BID for now.   3. CAD - no active chest pain  4. CKD - recheck labs today.   5. HTN - Norvasc cut in half last week - BP looks good at this time.   Current medicines are reviewed with the patient today.  The patient does not have concerns regarding medicines other than what has been noted above.  The following changes have been made:  See above.  Labs/ tests ordered today include:    Orders Placed This Encounter  Procedures  . Brain natriuretic peptide  . Basic metabolic panel  . CBC  . Iron Binding Cap (TIBC)  . Ferritin  . Ambulatory referral to Gastroenterology     Disposition:   FU with me in 2 weeks.    Patient is agreeable to this plan and will call if any problems develop in the interim.   Signed: Burtis Junes, RN, ANP-C 01/09/2016 10:21 AM  [facility] [address] [place], [place]  [address] Phone: [phone] Fax: [phone]

## 2016-01-09 NOTE — Patient Instructions (Addendum)
We will be checking the following labs today - BNP, BMET, CBC, Ferritin, TIBC   Medication Instructions:    Continue with your current medicines.   No more Zaroxolyn  Stay on the Lasix twice a day    Testing/Procedures To Be Arranged:  N/A  Follow-Up:   See me in 2 weeks  Referral to Dr. Sydell Axon in [place] for GI consult - he has seen him in the past.     Other Special Instructions:   N/A    If you need a refill on your cardiac medications before your next appointment, please call your pharmacy.   Call the [place] office at [phone] if you have any questions, problems or concerns.

## 2016-01-10 ENCOUNTER — Other Ambulatory Visit: Payer: Self-pay | Admitting: *Deleted

## 2016-01-10 DIAGNOSIS — N183 Chronic kidney disease, stage 3 unspecified: Secondary | ICD-10-CM

## 2016-01-10 DIAGNOSIS — D631 Anemia in chronic kidney disease: Secondary | ICD-10-CM

## 2016-01-10 MED ORDER — FUROSEMIDE 40 MG PO TABS: 40.0000 mg | ORAL_TABLET | Freq: Every day | ORAL | 6 refills | Status: DC

## 2016-01-10 MED ORDER — FLUTICASONE-SALMETEROL 100-50 MCG/DOSE IN AEPB: 1.0000 | INHALATION_SPRAY | Freq: Two times a day (BID) | RESPIRATORY_TRACT | 3 refills | Status: DC

## 2016-01-16 ENCOUNTER — Other Ambulatory Visit (INDEPENDENT_AMBULATORY_CARE_PROVIDER_SITE_OTHER): Payer: Medicare Other

## 2016-01-16 DIAGNOSIS — R809 Proteinuria, unspecified: Secondary | ICD-10-CM | POA: Diagnosis not present

## 2016-01-16 DIAGNOSIS — D631 Anemia in chronic kidney disease: Secondary | ICD-10-CM

## 2016-01-16 DIAGNOSIS — I10 Essential (primary) hypertension: Secondary | ICD-10-CM | POA: Diagnosis not present

## 2016-01-16 DIAGNOSIS — N183 Chronic kidney disease, stage 3 unspecified: Secondary | ICD-10-CM

## 2016-01-16 DIAGNOSIS — E1129 Type 2 diabetes mellitus with other diabetic kidney complication: Secondary | ICD-10-CM | POA: Diagnosis not present

## 2016-01-16 DIAGNOSIS — D649 Anemia, unspecified: Secondary | ICD-10-CM | POA: Diagnosis not present

## 2016-01-16 DIAGNOSIS — D472 Monoclonal gammopathy: Secondary | ICD-10-CM | POA: Diagnosis not present

## 2016-01-16 LAB — CBC WITH DIFFERENTIAL/PLATELET
Basophils Absolute: 0 cells/uL (ref 0–200)
Basophils Relative: 0 %
Eosinophils Absolute: 100 cells/uL (ref 15–500)
Eosinophils Relative: 2 %
HCT: 26.6 % — ABNORMAL LOW (ref 38.5–50.0)
Hemoglobin: 8.7 g/dL — ABNORMAL LOW (ref 13.2–17.1)
Lymphocytes Relative: 27 %
Lymphs Abs: 1350 cells/uL (ref 850–3900)
MCH: 29.5 pg (ref 27.0–33.0)
MCHC: 32.7 g/dL (ref 32.0–36.0)
MCV: 90.2 fL (ref 80.0–100.0)
MPV: 8.7 fL (ref 7.5–12.5)
Monocytes Absolute: 500 cells/uL (ref 200–950)
Monocytes Relative: 10 %
Neutro Abs: 3050 cells/uL (ref 1500–7800)
Neutrophils Relative %: 61 %
Platelets: 351 10*3/uL (ref 140–400)
RBC: 2.95 MIL/uL — ABNORMAL LOW (ref 4.20–5.80)
RDW: 14.2 % (ref 11.0–15.0)
WBC: 5 10*3/uL (ref 3.8–10.8)

## 2016-01-16 LAB — BASIC METABOLIC PANEL WITH GFR
BUN: 56 mg/dL — ABNORMAL HIGH (ref 7–25)
CO2: 26 mmol/L (ref 20–31)
Calcium: 9.3 mg/dL (ref 8.6–10.3)
Chloride: 109 mmol/L (ref 98–110)
Creat: 1.92 mg/dL — ABNORMAL HIGH (ref 0.70–1.25)
Glucose, Bld: 57 mg/dL — ABNORMAL LOW (ref 65–99)
Potassium: 4.8 mmol/L (ref 3.5–5.3)
Sodium: 141 mmol/L (ref 135–146)

## 2016-01-17 DIAGNOSIS — R809 Proteinuria, unspecified: Secondary | ICD-10-CM | POA: Diagnosis not present

## 2016-01-19 ENCOUNTER — Encounter: Payer: Self-pay | Admitting: Nurse Practitioner

## 2016-01-19 ENCOUNTER — Encounter (HOSPITAL_COMMUNITY)
Admission: RE | Admit: 2016-01-19 | Discharge: 2016-01-19 | Disposition: A | Discharge status: Home / Self Care | Payer: Medicare Other | Source: Ambulatory Visit | Attending: Nephrology | Admitting: Nephrology

## 2016-01-19 DIAGNOSIS — D509 Iron deficiency anemia, unspecified: Secondary | ICD-10-CM | POA: Insufficient documentation

## 2016-01-19 MED ORDER — SODIUM CHLORIDE 0.9 % IV SOLN
INTRAVENOUS | Status: DC
  Administered 2016-01-19: 11:00:00 via INTRAVENOUS

## 2016-01-19 MED ORDER — FERUMOXYTOL INJECTION 510 MG/17 ML
510.0000 mg | Freq: Once | INTRAVENOUS | Status: AC
  Administered 2016-01-19: 510 mg via INTRAVENOUS
  Filled 2016-01-19: qty 17

## 2016-01-23 ENCOUNTER — Other Ambulatory Visit: Payer: Self-pay

## 2016-01-23 ENCOUNTER — Encounter: Payer: Self-pay | Admitting: Gastroenterology

## 2016-01-23 ENCOUNTER — Ambulatory Visit (INDEPENDENT_AMBULATORY_CARE_PROVIDER_SITE_OTHER): Payer: Medicare Other | Admitting: Gastroenterology

## 2016-01-23 DIAGNOSIS — I255 Ischemic cardiomyopathy: Secondary | ICD-10-CM

## 2016-01-23 DIAGNOSIS — K921 Melena: Secondary | ICD-10-CM | POA: Insufficient documentation

## 2016-01-23 DIAGNOSIS — D509 Iron deficiency anemia, unspecified: Secondary | ICD-10-CM

## 2016-01-23 DIAGNOSIS — D649 Anemia, unspecified: Secondary | ICD-10-CM

## 2016-01-23 HISTORY — DX: Iron deficiency anemia, unspecified: D50.9

## 2016-01-23 HISTORY — DX: Melena: K92.1

## 2016-01-23 MED ORDER — PEG 3350-KCL-NA BICARB-NACL 420 G PO SOLR: 4000.0000 mL | ORAL | 0 refills | Status: DC

## 2016-01-23 MED ORDER — PANTOPRAZOLE SODIUM 40 MG PO TBEC: 40.0000 mg | DELAYED_RELEASE_TABLET | Freq: Every day | ORAL | 3 refills | Status: DC

## 2016-01-23 NOTE — Patient Instructions (Addendum)
1. Colonoscopy and upper endoscopy with Dr. [name] as scheduled. Please see separate instructions. 2. If you develops signs or symptoms of worsening anemia please let us know or go straight to the emergency department. Symptoms of worsening anemia include increased fatigue, difficulty breathing or shortness of breath especially with walking, swelling, cough when laying down. 3. We recommend you start a medication (pantoprazole) for possible gastritis or ulcer which could explain black stools and anemia. We can decide whether you need to stay on chronically after your endoscopy.

## 2016-01-23 NOTE — Progress Notes (Signed)
Primary Care Physician:  Gildardo Cranker, DO Referring physician: Kathrynn Humble, NP and Dr. Candee Furbish Primary Gastroenterologist:  Garfield Cornea, MD   Chief Complaint  Patient presents with  . Anemia    HPI:  Henry Carroll is a 65 y.o. 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. 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  Patient was hospitalized back in November for edema, cough, fatigue, dyspnea when laying down. In the ED his hemoglobin was 6.5. He received 2 units of packed red blood cells. At time of discharge his hemoglobin was 8.9. His B12, folate were normal. Iron was low, ferritin low normal. He was Hemoccult negative. Notably prior to his CABG in January's hemoglobin was 11.3, at time of discharge after his CABG his hemoglobin was 8.6. Patient was also Hemoccult negative prior to this admission during his physical.  Patient states he was doing fine up until a day or 2 before his admission. He really did not notice any significant fatigue or dyspnea on exertion. Couple of days before his admission he started having lower extremity edema, difficulty laying down without coughing and shortness of breath. He remembers back the last week in October when he was out of town he started having pain in the left upper leg and black stools at that time. Lasted for a few days and went away. Denies Pepto-Bismol use. He uses Tylenol occasionally for pain. Denies NSAIDs or aspirin powders. He takes aspirin 325 mg daily.  Patient states he feels much better after his blood transfusion. He also denies abdominal pain, bright red blood per rectum, dysphagia, vomiting, weight loss. Occasional heartburn related to certain  foods such as barbecue. No alcohol use since October 2016. Does not feel like he was drinking excessively but his wife asked him to stop.  Patient has screening colonoscopy in 2003 by Dr. [name]. He had few scattered pancolonic diverticula. Suspicious lesion on the proximal side of the ileocecal valve. Biopsied multiple times. Biopsies were benign. In 2011 he had another colonoscopy. Ileocecal valve was seen adequately and appear to be unremarkable. He had diverticulosis. Small lesion could've been missed due to difficult bowel prep. He was advised to come back in 3 years, letter was sent but we did not get a response.  Current Outpatient Prescriptions  Medication Sig Dispense Refill  . acetaminophen (TYLENOL) 500 MG tablet Take 1,000 mg by mouth 2 (two) times daily as needed for moderate pain.    Marland Kitchen albuterol (PROVENTIL) (2.5 MG/3ML) 0.083% nebulizer solution USE ONE VIAL (3 ML) IN NEBULIZER EVERY 6 HOURS AS NEEDED FOR WHEEZING OR SHORTNESS OF BREATH. Dx:J45.21, R06.2 300 mL 11  . amLODipine (NORVASC) 10 MG tablet Take 0.5 tablets (5 mg total) by mouth daily. 30 tablet 2  . aspirin EC 325 MG EC tablet Take 1 tablet (325 mg total) by mouth daily.    Marland Kitchen atorvastatin (LIPITOR) 40 MG tablet Take 1 tablet (40 mg total) by mouth every evening. 90 tablet 3  . benazepril (LOTENSIN) 40 MG tablet TAKE ONE TABLET BY MOUTH ONCE DAILY FOR BLOOD PRESSURE 30 tablet 5  . carvedilol (COREG) 12.5 MG tablet TAKE ONE TABLET BY MOUTH TWICE DAILY WITH A MEAL 60 tablet 8  . Cholecalciferol ([employer] VITAMIN D3) 1000 UNITS  capsule Take 2 capsules (2,000 Units total) by mouth daily. 60 capsule 3  . CINNAMON PO Take 1 capsule by mouth 2 (two) times daily.     . Coenzyme Q10 (CO Q 10 PO) Take 200 mg by mouth 2 (two) times daily.     . fluticasone (FLONASE) 50 MCG/ACT nasal spray Place 2 sprays into both nostrils daily as needed for allergies or rhinitis (SEASONAL ALLERGIES).    . Fluticasone-Salmeterol (ADVAIR DISKUS) 100-50  MCG/DOSE AEPB Inhale 1 puff into the lungs 2 (two) times daily. 60 each 3  . furosemide (LASIX) 40 MG tablet Take 1 tablet (40 mg total) by mouth daily. 60 tablet 6  . hydrALAZINE (APRESOLINE) 25 MG tablet Take 25 mg by mouth 2 (two) times daily.    . insulin aspart (NOVOLOG) 100 UNIT/ML FlexPen Take 5 units above 250, 10 units above 300. (Patient taking differently: Inject 5-10 Units into the skin 2 (two) times daily before a meal. 5 units if BGL is 200 or greater and 10 units if 250 or greater) 15 mL 0  . insulin detemir (LEVEMIR) 100 UNIT/ML injection Inject 60 Units into the skin 2 (two) times daily.     . montelukast (SINGULAIR) 10 MG tablet Take 1 tablet (10 mg total) by mouth at bedtime. 30 tablet 6  . Multiple Vitamins-Minerals (ONE-A-DAY MENS 50+ ADVANTAGE) TABS Take 1 tablet by mouth daily.    Marland Kitchen nystatin (MYCOSTATIN/NYSTOP) powder Apply topically 3 (three) times daily. To groin folds x 4 weeks or until healed 30 g 1  . ONE TOUCH ULTRA TEST test strip USE ONE STRIP TO CHECK GLUCOSE THREE TIMES DAILY 100 each 11  . PROAIR HFA 108 (90 Base) MCG/ACT inhaler INHALE TWO PUFFS BY MOUTH EVERY 6 HOURS AS NEEDED FOR SHORTNESS OF BREATH 27 each 2  . terbinafine (LAMISIL) 1 % cream Apply 1 application topically daily as needed (foot).     . TRADJENTA 5 MG TABS tablet TAKE ONE TABLET BY MOUTH ONCE DAILY 30 tablet 6   No current facility-administered medications for this visit.     Allergies as of 01/23/2016  . (No Known Allergies)    Past Medical History:  Diagnosis Date  . Anemia   . 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 Asthma   . Chronic combined systolic (congestive) and diastolic (congestive) heart failure    a. 12/31/14: 2D ECHO: EF 40-45%, HK of inf myocardium, G1DD, mod MR  . CKD (chronic kidney disease), stage III   . Coronary artery disease    a. LHC 01/2015 - triple vessel CAD (mod oLM, mLAD, severe mRCA, intermediate branch stenosis, CTO of mCx). Plan CABG 02/2015.  Marland Kitchen Hyperlipidemia    . Hypertension   . Mitral regurgitation    a. Mild-mod by echo 12/2014.  Marland Kitchen Myocardial infarction    pt. states per [name] he has in the past  . NSVT (nonsustained ventricular tachycardia) ([place])    a. 9 beats during 01/2015 adm. BB titrated.  . Obesity    a. BMI 33  . Type II diabetes mellitus ([place])     Past Surgical History:  Procedure Laterality Date  . BACK SURGERY    . CARDIAC CATHETERIZATION N/A 02/09/2015   Procedure: Left Heart Cath and Coronary Angiography;  Surgeon: Burnell Blanks, MD;  Location: [facility];  Service: Cardiovascular;  Laterality: N/A;  . COLONOSCOPY  "3-4 times"  . CORONARY ARTERY BYPASS GRAFT N/A 02/18/2015   Procedure: CORONARY  ARTERY BYPASS GRAFTING (CABG) x  four, using bilateral internal mammary arteries and right leg greater saphenous vein harvested endoscopically;  Surgeon: [name], MD;  Location: [facility];  Service: Open Heart Surgery;  Laterality: N/A;  . HERNIA REPAIR  [id number]   Umbilical  . LUMBAR [place] SURGERY  2006   L4 & L5  . TEE WITHOUT CARDIOVERSION N/A 02/18/2015   Procedure: TRANSESOPHAGEAL ECHOCARDIOGRAM (TEE);  Surgeon: [name], MD;  Location: [facility];  Service: Open Heart Surgery;  Laterality: N/A;  . TONSILLECTOMY  1962    Family History  Problem Relation Age of Onset  . Diabetes Mother   . Heart disease Mother     later in life, age >36  . Heart disease Father     heart failure later in life  . Hypertension Father   . Stroke Sister   . Kidney disease Daughter   . Sudden death Daughter   . Heart attack Neg Hx     Social History   Social History  . Marital status: Married    Spouse name: N/A  . Number of children: N/A  . Years of education: N/A   Occupational History  . Not on file.   Social History Main Topics  . Smoking status: Former Smoker    Years: 17.00    Types: Cigars    Quit date: 12/31/2014  . Smokeless tobacco: Never Used  . Alcohol use 0.0 oz/week     Comment: 12/29/2015  "stopped 12/2014"  . Drug use: No  . Sexual activity: Yes    Birth control/ protection: None   Other Topics Concern  . Not on file   Social History Narrative  . No narrative on file      ROS:  General: Negative for anorexia, weight loss, fever, chills,  weakness.Fatigue although improved since transfusion Eyes: Negative for vision changes.  ENT: Negative for hoarseness, difficulty swallowing , nasal congestion. CV: Negative for chest pain, angina, palpitations, dyspnea on exertion, Positive peripheral edema.  Respiratory: Negative for dyspnea at rest, dyspnea on exertion, cough, sputum, wheezing.  GI: See history of present illness. GU:  Negative for dysuria, hematuria, urinary incontinence, urinary frequency, nocturnal urination.  MS: Negative for joint pain, low back pain.  Derm: Negative for rash or itching.  Neuro: Negative for weakness, abnormal sensation, seizure, frequent headaches, memory loss, confusion.  Psych: Negative for anxiety, depression, suicidal ideation, hallucinations.  Endo: Negative for unusual weight change.  Heme: Negative for bruising or bleeding. Allergy: Negative for rash or hives.    Physical Examination:  BP (!) 142/76   Pulse 80   Temp 98.3 F (36.8 C) (Oral)   Ht 6\' 1"  (1.854 m)   Wt 244 lb (110.7 kg)   BMI 32.19 kg/m    General: Well-nourished, well-developed in no acute distress.  Head: Normocephalic, atraumatic.   Eyes: Conjunctiva pink, no icterus. Mouth: Oropharyngeal mucosa moist and pink , no lesions erythema or exudate. Neck: Supple without thyromegaly, masses, or lymphadenopathy.  Lungs: Clear to auscultation bilaterally.  Heart: Regular rate and rhythm, no murmurs rubs or gallops.  Abdomen: Bowel sounds are normal, nontender, nondistended, no hepatosplenomegaly or masses, no abdominal bruits or    hernia , no rebound or guarding.   Rectal: Deferred Extremities: No lower extremity edema. No clubbing or deformities.  Neuro:  Alert and oriented x 4 , grossly normal neurologically.  Skin: Warm and dry, no rash or jaundice.   Psych: Alert and cooperative, normal mood and  affect.  Labs: Lab Results  Component Value Date   WBC 5.0 01/16/2016   HGB 8.7 (L) 01/16/2016   HCT 26.6 (L) 01/16/2016   MCV 90.2 01/16/2016   PLT 351 01/16/2016   Lab Results  Component Value Date   IRON 40 (L) 01/09/2016   TIBC 338 01/09/2016   FERRITIN 18 (L) 01/09/2016   Lab Results  Component Value Date   CREATININE 1.92 (H) 01/16/2016   BUN 56 (H) 01/16/2016   NA 141 01/16/2016   K 4.8 01/16/2016   CL 109 01/16/2016   CO2 26 01/16/2016   Lab Results  Component Value Date   ALT 19 12/19/2015   AST 22 12/19/2015   ALKPHOS 61 12/19/2015   BILITOT 0.2 12/19/2015   Lab Results  Component Value Date   [id number] 097 12/31/2015   Lab Results  Component Value Date   FOLATE 16.1 12/31/2015      Imaging Studies: Dg Chest 2 View  Result Date: 12/29/2015 CLINICAL DATA:  Shortness of breath and productive cough. Bilateral lower extremity swelling. Chest tightness. EXAM: CHEST  2 VIEW COMPARISON:  PA and lateral chest 03/23/2015 and 12/29/2014. FINDINGS: The patient is status post CABG. Heart size is upper normal. No pneumothorax or pleural effusion. IMPRESSION: No acute disease. Electronically Signed   By: Inge Rise M.D.   On: 12/29/2015 14:09

## 2016-01-23 NOTE — Assessment & Plan Note (Addendum)
65 year old gentleman with multiple comorbidities as outlined above, CABG 4 in January 2017 with recent hospitalization for symptomatic acute on chronic anemia. Heme-negative stool 2 recently. Patient reports very dark stools the last week of October but none since. Difficult bowel prep at time of last colonoscopy in 2011, overdue for repeat colonoscopy. At this time I recommend colonoscopy and upper endoscopy for further evaluation. Augment conscious sedation with Phenergan 12.5 mg IV 30 minutes before the procedure given prior alcohol history. He did well with conscious sedation in the past.  I have discussed the risks, alternatives, benefits with regards to but not limited to the risk of reaction to medication, bleeding, infection, perforation and the patient is agreeable to proceed. Written consent to be obtained.  I have been informed that his procedures about 3 weeks away. He will need to have an updated hemoglobin in the interim to ensure stability. He is scheduled to see his cardiologist tomorrow and they had mentioned repeating his hemoglobin. We will follow-up on that. If not we will have one done in the next 1 week or so. Warning symptoms provided the patient. We will start pantoprazole 40 mg daily in light of history of melena. Okay to continue aspirin 325 mg daily for now.

## 2016-01-23 NOTE — Progress Notes (Signed)
cc'ed to pcp °

## 2016-01-24 ENCOUNTER — Encounter: Payer: Self-pay | Admitting: Nurse Practitioner

## 2016-01-24 ENCOUNTER — Ambulatory Visit (INDEPENDENT_AMBULATORY_CARE_PROVIDER_SITE_OTHER): Payer: Medicare Other | Admitting: Nurse Practitioner

## 2016-01-24 DIAGNOSIS — N183 Chronic kidney disease, stage 3 unspecified: Secondary | ICD-10-CM

## 2016-01-24 DIAGNOSIS — I255 Ischemic cardiomyopathy: Secondary | ICD-10-CM

## 2016-01-24 DIAGNOSIS — D631 Anemia in chronic kidney disease: Secondary | ICD-10-CM

## 2016-01-24 LAB — BASIC METABOLIC PANEL
BUN: 38 mg/dL — ABNORMAL HIGH (ref 7–25)
CO2: 25 mmol/L (ref 20–31)
Calcium: 9 mg/dL (ref 8.6–10.3)
Chloride: 106 mmol/L (ref 98–110)
Creat: 1.75 mg/dL — ABNORMAL HIGH (ref 0.70–1.25)
Glucose, Bld: 151 mg/dL — ABNORMAL HIGH (ref 65–99)
Potassium: 4.4 mmol/L (ref 3.5–5.3)
Sodium: 140 mmol/L (ref 135–146)

## 2016-01-24 LAB — CBC
HCT: 27.4 % — ABNORMAL LOW (ref 38.5–50.0)
Hemoglobin: 8.9 g/dL — ABNORMAL LOW (ref 13.2–17.1)
MCH: 29.4 pg (ref 27.0–33.0)
MCHC: 32.5 g/dL (ref 32.0–36.0)
MCV: 90.4 fL (ref 80.0–100.0)
MPV: 8.8 fL (ref 7.5–12.5)
Platelets: 293 10*3/uL (ref 140–400)
RBC: 3.03 MIL/uL — ABNORMAL LOW (ref 4.20–5.80)
RDW: 14.7 % (ref 11.0–15.0)
WBC: 5.9 10*3/uL (ref 3.8–10.8)

## 2016-01-24 MED ORDER — ASPIRIN EC 81 MG PO TBEC: 81.0000 mg | DELAYED_RELEASE_TABLET | Freq: Every day | ORAL | 3 refills | Status: AC

## 2016-01-24 MED ORDER — FUROSEMIDE 40 MG PO TABS: 40.0000 mg | ORAL_TABLET | Freq: Two times a day (BID) | ORAL | 6 refills | Status: DC

## 2016-01-24 NOTE — Patient Instructions (Addendum)
We will be checking the following labs today - BMET and CBC   Medication Instructions:    Continue with your current medicines. BUT  Cut your aspirin back to 81 mg a day    Testing/Procedures To Be Arranged:  N/A  Follow-Up:   See [name] as planned in January    Other Special Instructions:   Keep a check on your weight - avoid salt.     If you need a refill on your cardiac medications before your next appointment, please call your pharmacy.   Call the [place] office at [phone] if you have any questions, problems or concerns.

## 2016-01-24 NOTE — Progress Notes (Signed)
CARDIOLOGY OFFICE NOTE  Date:  01/24/2016    [name] Date of Birth: 1950-12-16 Medical Record #[id number]  PCP:  Gildardo Cranker, DO  Cardiologist:  [name]  Chief Complaint  Patient presents with  . Congestive Heart Failure    Follow up visit - seen for [name]    History of Present Illness: Henry Carroll is a 65 y.o. male who presents today for a follow up visit. Seen for [name].   He has known CAD s/p1/6/17 CABG 4 with LIMA to LAD, free right internal mammary graft to ramus, SVG to OM, SVG to PDA. 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 Mild to moderate MR. Prior nuclear stress test was intermediate risk showing a medium-sized severe basal to mid inferior inferolateral perfusion defect. EF on nuclear stress was 30%. Echo from February showed improvement with EF of 45 to 50%.   Seen 5 months ago and he was doing ok.   Admitted about 4 weeks ago with shortness of breath and weight gain of 12 pounds - found to be anemic with a HGB of 6.5. Was transfused and diuresed. The anemia was felt to be related to CKD. No signs of gross bleeding. Was to follow up with PCP. Was not seen by cardiology during this stay.   I then saw him last week for his post hospital visit - he was not doing well. Still short of breath/orthopneic. Weight continuing to climb. Looked anemic. Got a limited echo - EF 35 to 40%. Diuresed with short course of Zaroxolyn. Cut Norvasc in half.  Saw his PCP that day as well - does not look like he was referred back to GI. Discussed with [name] - we both did NOT feel that his CKD was the cause for his hemoglobin of 6.   I then saw him towards the end of November - he was doing better. Weight down 17 pounds. Referred to GI in [place]. Hemoglobin still low but stable.   Comes in today. Here alone. Doing ok. No  swelling. Breathing much better. No more PND/orthopnea. Swelling has improved. Still with some fatigue. Still on Lasix BID - this is his normal dose. Weight is stable at home. He notes that he is still quite fatigued. Colonoscopy in early January scheduled. Now on PPI therapy. Remains on full dose aspirin. Overall, he is improved.   Past Medical History:  Diagnosis Date  . Anemia   . 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 Asthma   . Chronic combined systolic (congestive) and diastolic (congestive) heart failure    a. 12/31/14: 2D ECHO: EF 40-45%, HK of inf myocardium, G1DD, mod MR  . CKD (chronic kidney disease), stage III   . Coronary artery disease    a. LHC 01/2015 - triple vessel CAD (mod oLM, mLAD, severe mRCA, intermediate branch stenosis, CTO of mCx). Plan CABG 02/2015.  Marland Kitchen Hyperlipidemia   . Hypertension   . Mitral regurgitation    a. Mild-mod by echo 12/2014.  Marland Kitchen Myocardial infarction    pt. states per [name] he has in the past  . NSVT (nonsustained ventricular tachycardia) ([place])    a. 9 beats during 01/2015 adm. BB titrated.  . Obesity    a. BMI 33  . Type II diabetes mellitus ([place])     Past Surgical History:  Procedure Laterality Date  . BACK SURGERY    .  CARDIAC CATHETERIZATION N/A 02/09/2015   Procedure: Left Heart Cath and Coronary Angiography;  Surgeon: Burnell Blanks, MD;  Location: [facility];  Service: Cardiovascular;  Laterality: N/A;  . COLONOSCOPY  2011   Dr. [name]: Ileocecal valve appeared normal, scattered pancolonic diverticulosis, difficult bowel prep making smaller lesions potentially missed. Recommended three-year follow-up colonoscopy.  . COLONOSCOPY  2003   Dr. [name]: Suspicious lesion at the ileocecal valve, multiple biopsies benign, pancolonic diverticulosis.  . CORONARY ARTERY BYPASS GRAFT N/A 02/18/2015   Procedure: CORONARY ARTERY BYPASS GRAFTING (CABG) x  four, using bilateral internal mammary arteries and right leg greater saphenous  vein harvested endoscopically;  Surgeon: [name], MD;  Location: [facility];  Service: Open Heart Surgery;  Laterality: N/A;  . HERNIA REPAIR  1517   Umbilical  . LUMBAR [place] SURGERY  2006   L4 & L5  . TEE WITHOUT CARDIOVERSION N/A 02/18/2015   Procedure: TRANSESOPHAGEAL ECHOCARDIOGRAM (TEE);  Surgeon: [name], MD;  Location: [facility];  Service: Open Heart Surgery;  Laterality: N/A;  . TONSILLECTOMY  1962     Medications: Current Outpatient Prescriptions  Medication Sig Dispense Refill  . acetaminophen (TYLENOL) 500 MG tablet Take 1,000 mg by mouth 2 (two) times daily as needed for moderate pain.    Marland Kitchen albuterol (PROVENTIL) (2.5 MG/3ML) 0.083% nebulizer solution USE ONE VIAL (3 ML) IN NEBULIZER EVERY 6 HOURS AS NEEDED FOR WHEEZING OR SHORTNESS OF BREATH. Dx:J45.21, R06.2 300 mL 11  . amLODipine (NORVASC) 10 MG tablet Take 0.5 tablets (5 mg total) by mouth daily. 30 tablet 2  . atorvastatin (LIPITOR) 40 MG tablet Take 1 tablet (40 mg total) by mouth every evening. 90 tablet 3  . benazepril (LOTENSIN) 40 MG tablet TAKE ONE TABLET BY MOUTH ONCE DAILY FOR BLOOD PRESSURE 30 tablet 5  . carvedilol (COREG) 12.5 MG tablet TAKE ONE TABLET BY MOUTH TWICE DAILY WITH A MEAL 60 tablet 8  . Cholecalciferol ([employer] VITAMIN D3) 1000 UNITS capsule Take 2 capsules (2,000 Units total) by mouth daily. 60 capsule 3  . CINNAMON PO Take 1 capsule by mouth 2 (two) times daily.     . Coenzyme Q10 (CO Q 10 PO) Take 200 mg by mouth 2 (two) times daily.     . fluticasone (FLONASE) 50 MCG/ACT nasal spray Place 2 sprays into both nostrils daily as needed for allergies or rhinitis (SEASONAL ALLERGIES).    . Fluticasone-Salmeterol (ADVAIR DISKUS) 100-50 MCG/DOSE AEPB Inhale 1 puff into the lungs 2 (two) times daily. 60 each 3  . furosemide (LASIX) 40 MG tablet Take 1 tablet (40 mg total) by mouth 2 (two) times daily. 60 tablet 6  . hydrALAZINE (APRESOLINE) 25 MG tablet Take 25 mg by mouth 2 (two) times daily.    .  insulin aspart (NOVOLOG) 100 UNIT/ML FlexPen Take 5 units above 250, 10 units above 300. (Patient taking differently: Inject 5-10 Units into the skin 2 (two) times daily before a meal. 5 units if BGL is 200 or greater and 10 units if 250 or greater) 15 mL 0  . insulin detemir (LEVEMIR) 100 UNIT/ML injection Inject 60 Units into the skin 2 (two) times daily.     . montelukast (SINGULAIR) 10 MG tablet Take 1 tablet (10 mg total) by mouth at bedtime. 30 tablet 6  . Multiple Vitamins-Minerals (ONE-A-DAY MENS 50+ ADVANTAGE) TABS Take 1 tablet by mouth daily.    Marland Kitchen nystatin (MYCOSTATIN/NYSTOP) powder Apply topically 3 (three) times daily. To groin  folds x 4 weeks or until healed 30 g 1  . ONE TOUCH ULTRA TEST test strip USE ONE STRIP TO CHECK GLUCOSE THREE TIMES DAILY 100 each 11  . pantoprazole (PROTONIX) 40 MG tablet Take 1 tablet (40 mg total) by mouth daily. 30 tablet 3  . polyethylene glycol-electrolytes (TRILYTE) 420 g solution Take 4,000 mLs by mouth as directed. 4000 mL 0  . PROAIR HFA 108 (90 Base) MCG/ACT inhaler INHALE TWO PUFFS BY MOUTH EVERY 6 HOURS AS NEEDED FOR SHORTNESS OF BREATH 27 each 2  . terbinafine (LAMISIL) 1 % cream Apply 1 application topically daily as needed (foot).     . TRADJENTA 5 MG TABS tablet TAKE ONE TABLET BY MOUTH ONCE DAILY 30 tablet 6  . aspirin EC 81 MG tablet Take 1 tablet (81 mg total) by mouth daily. 90 tablet 3   No current facility-administered medications for this visit.     Allergies: No Known Allergies  Social History: The patient  reports that he quit smoking about 12 months ago. His smoking use included Cigars. He quit after 17.00 years of use. He has never used smokeless tobacco. He reports that he drinks alcohol. He reports that he does not use drugs.   Family History: The patient's family history includes Diabetes in his mother; Heart disease in his father and mother; Hypertension in his father; Kidney disease in his daughter; Stroke in his sister;  Sudden death in his daughter.   Review of Systems: Please see the history of present illness.   Otherwise, the review of systems is positive for none.   All other systems are reviewed and negative.   Physical Exam: VS:  BP 132/64   Pulse 83   Ht 6\' 1"  (1.854 m)   Wt 245 lb 1.9 oz (111.2 kg)   SpO2 98% Comment: at rest  BMI 32.34 kg/m  .  BMI Body mass index is 32.34 kg/m.  Wt Readings from Last 3 Encounters:  01/24/16 245 lb 1.9 oz (111.2 kg)  01/23/16 244 lb (110.7 kg)  01/19/16 240 lb (108.9 kg)    General: Pleasant. Well developed, well nourished and in no acute distress.   HEENT: Normal.  Neck: Supple, no JVD, carotid bruits, or masses noted.  Cardiac: Regular rate and rhythm. No murmurs, rubs, or gallops. No edema.  Respiratory:  Lungs are clear to auscultation bilaterally with normal work of breathing.  GI: Soft and nontender.  MS: No deformity or atrophy. Gait and ROM intact.  Skin: Warm and dry. Color is normal.  Neuro:  Strength and sensation are intact and no gross focal deficits noted.  Psych: Alert, appropriate and with normal affect.   LABORATORY DATA:  EKG:  EKG is not ordered today.  Lab Results  Component Value Date   WBC 5.0 01/16/2016   HGB 8.7 (L) 01/16/2016   HCT 26.6 (L) 01/16/2016   PLT 351 01/16/2016   GLUCOSE 57 (L) 01/16/2016   CHOL 153 12/19/2015   TRIG 128 12/19/2015   HDL 49 12/19/2015   LDLCALC 78 12/19/2015   ALT 19 12/19/2015   AST 22 12/19/2015   NA 141 01/16/2016   K 4.8 01/16/2016   CL 109 01/16/2016   CREATININE 1.92 (H) 01/16/2016   BUN 56 (H) 01/16/2016   CO2 26 01/16/2016   TSH 0.94 12/19/2015   PSA 0.8 12/19/2015   INR 1.44 02/18/2015   HGBA1C 7.4 (H) 12/19/2015   MICROALBUR 72.1 09/16/2015    BNP (last 3 results)  Recent Labs  12/29/15 1328 01/03/16 0927 01/09/16 1045  BNP 574.0* 646.1* 199.2*    ProBNP (last 3 results) No results for input(s): PROBNP in the last 8760 hours.   Other Studies Reviewed  Today:  Limited Echo Study Conclusions from 12/2015  - Left ventricle: The cavity size was mildly dilated. Wall thickness was increased in a pattern of mild LVH. Systolic function was moderately reduced. The estimated ejection fraction was in the range of 35% to 40%. Diffuse hypokinesis. There is akinesis of the mid-apicalinferolateral myocardium. Features are consistent with a pseudonormal left ventricular filling pattern, with concomitant abnormal relaxation and increased filling pressure (grade 2 diastolic dysfunction). - Aortic valve: Trileaflet; mildly thickened, mildly calcified leaflets. - Aorta: Ascending aortic diameter: 40 mm (S). - Ascending aorta: The ascending aorta was mildly dilated. - Mitral valve: There was mild regurgitation. - Left atrium: The atrium was mildly dilated. - Tricuspid valve: There was moderate regurgitation. - Pulmonary arteries: Systolic pressure was mildly increased. PA peak pressure: 42 mm Hg (S).   Echo Study Conclusions from 03/2015  - Left ventricle: The cavity size was normal. Systolic function was mildly reduced. The estimated ejection fraction was in the range of 45% to 50%. Doppler parameters are consistent with abnormal left ventricular relaxation (grade 1 diastolic dysfunction). Mild concentric and moderate focal basal septal hypertrophy. - Regional wall motion abnormality: Mild hypokinesis of the mid anteroseptal, mid inferoseptal, mid inferior, and apical myocardium. - Ventricular septum: Septal motion showed abnormal function and dyssynergy. These changes are consistent with a post-thoracotomy state. - Aortic valve: Moderately calcified annulus. Trileaflet. - Mitral valve: Calcified annulus. Normal thickness leaflets . - Left atrium: The atrium was mildly dilated. - Right ventricle: Systolic function grossly appears to be low normal to mildly reduced.  Assessment/Plan: 1. Profound anemia  - this was presumed by the hospitalist to be secondary to CKD - [name] and I feel this degree of anemia seems out of proportion. Have referred back to GI and he has been placed on PPI therapy and he will be having a colonoscopy in early January.   2. Mild systolic heart failure - exacerbated by profound anemia. Improving.   3. CAD - no active chest pain. With his anemia - I have cut his Aspirin back to 81 mg a day.   4. CKD - followed by nephrology.   5. HTN - Norvasc cut in half previously - BP looks good at this time. I have left him on his current regimen.    Current medicines are reviewed with the patient today.  The patient does not have concerns regarding medicines other than what has been noted above.  The following changes have been made:  See above.  Labs/ tests ordered today include:    Orders Placed This Encounter  Procedures  . Basic metabolic panel  . CBC     Disposition:   FU with [name] in January as planned.  I will be happy to see back as needed.   Patient is agreeable to this plan and will call if any problems develop in the interim.   Signed: Burtis Junes, RN, ANP-C 01/24/2016 9:08 AM  [facility] [address] [place], [place]  [address] Phone: [phone] Fax: [phone]

## 2016-01-26 ENCOUNTER — Encounter (HOSPITAL_COMMUNITY)
Admission: RE | Admit: 2016-01-26 | Discharge: 2016-01-26 | Disposition: A | Discharge status: Home / Self Care | Payer: Medicare Other | Source: Ambulatory Visit | Attending: Nephrology | Admitting: Nephrology

## 2016-01-26 ENCOUNTER — Encounter (HOSPITAL_COMMUNITY): Payer: Self-pay

## 2016-01-26 DIAGNOSIS — D509 Iron deficiency anemia, unspecified: Secondary | ICD-10-CM | POA: Diagnosis not present

## 2016-01-26 MED ORDER — SODIUM CHLORIDE 0.9 % IV SOLN
INTRAVENOUS | Status: DC
  Administered 2016-01-26: 12:00:00 via INTRAVENOUS

## 2016-01-26 MED ORDER — SODIUM CHLORIDE 0.9 % IV SOLN
510.0000 mg | Freq: Once | INTRAVENOUS | Status: AC
  Administered 2016-01-26: 510 mg via INTRAVENOUS
  Filled 2016-01-26: qty 17

## 2016-01-29 ENCOUNTER — Other Ambulatory Visit: Payer: Self-pay | Admitting: Physician Assistant

## 2016-01-29 DIAGNOSIS — J45901 Unspecified asthma with (acute) exacerbation: Secondary | ICD-10-CM

## 2016-02-01 ENCOUNTER — Other Ambulatory Visit: Payer: Self-pay | Admitting: *Deleted

## 2016-02-01 ENCOUNTER — Other Ambulatory Visit: Payer: Self-pay | Admitting: Physician Assistant

## 2016-02-01 MED ORDER — INSULIN DETEMIR 100 UNIT/ML ~~LOC~~ SOLN: 60.0000 [IU] | Freq: Two times a day (BID) | SUBCUTANEOUS | 3 refills | Status: DC

## 2016-02-01 NOTE — Telephone Encounter (Signed)
Patient requested refill

## 2016-02-01 NOTE — Telephone Encounter (Signed)
CALLED TO VERIFY HYDRALAZINE 25 MG DOSAGE INSTRUCTIONS WITH PT, AS ONE WAS TID AND ONE WAS BID, WILL CORRECT ACCORDINGLY.   PT VERIFIED THAT HYDRALAZINE 25 MG IS BID, WILL SEND IN REFILL FOR CORRECT DOSAGE.

## 2016-02-01 NOTE — Telephone Encounter (Signed)
Last ov with chelle 11/2015, sees other md more recently with labs.

## 2016-02-03 ENCOUNTER — Other Ambulatory Visit: Payer: Self-pay

## 2016-02-03 MED ORDER — INSULIN ASPART 100 UNIT/ML FLEXPEN: PEN_INJECTOR | SUBCUTANEOUS | 3 refills | Status: DC

## 2016-02-03 NOTE — Telephone Encounter (Signed)
fax req [facility] for Novolog flexpen 100unit/ml #15  Last HA1c 7.4 12/19/2015 shown in Epic

## 2016-02-03 NOTE — Progress Notes (Signed)
Lab Results  Component Value Date   WBC 5.9 01/24/2016   HGB 8.9 (L) 01/24/2016   HCT 27.4 (L) 01/24/2016   MCV 90.4 01/24/2016   PLT 293 01/24/2016   Hemoglobin remains stable colonoscopy/EGD as scheduled.  Repeat CBC, ferritin day of his procedure.

## 2016-02-08 NOTE — Patient Instructions (Signed)
Henry Carroll  02/08/2016     @PREFPERIOPPHARMACY @   Your procedure is scheduled on  02/15/2015   Report to [facility] at  1045  A.M.  Call this number if you have problems the morning of surgery:  [phone]   Remember:  Do not eat food or drink liquids after midnight.  Take these medicines the morning of surgery with A SIP OF WATER  Amlodipine, coreg, lotensin, protonix. Take your nebulizer and your inhalers before you come and bring your rescue inhaler with you. Only take 1/2 of your usual night time insulin dosage the night before your procedure. DO NOT take any medications for diabetes the morning of surgery.   Do not wear jewelry, make-up or nail polish.  Do not wear lotions, powders, or perfumes, or deoderant.  Do not shave 48 hours prior to surgery.  Men may shave face and neck.  Do not bring valuables to the hospital.  [facility] is not responsible for any belongings or valuables.  Contacts, dentures or bridgework may not be worn into surgery.  Leave your suitcase in the car.  After surgery it may be brought to your room.  For patients admitted to the hospital, discharge time will be determined by your treatment team.  Patients discharged the day of surgery will not be allowed to drive home.   Name and phone number of your driver:   family Special instructions:  Follow the diet and prep instructions given to you by Dr Roseanne Kaufman office.  Please read over the following fact sheets that you were given. Anesthesia Post-op Instructions and Care and Recovery After Surgery       Esophagogastroduodenoscopy Introduction Esophagogastroduodenoscopy (EGD) is a procedure to examine the lining of the esophagus, stomach, and first part of the small intestine (duodenum). This procedure is done to check for problems such as inflammation, bleeding, ulcers, or growths. During this procedure, a long, flexible, lighted tube with a camera attached  (endoscope) is inserted down the throat. Tell a health care provider about:  Any allergies you have.  All medicines you are taking, including vitamins, herbs, eye drops, creams, and over-the-counter medicines.  Any problems you or family members have had with anesthetic medicines.  Any blood disorders you have.  Any surgeries you have had.  Any medical conditions you have.  Whether you are pregnant or may be pregnant. What are the risks? Generally, this is a safe procedure. However, problems may occur, including:  Infection.  Bleeding.  A tear (perforation) in the esophagus, stomach, or duodenum.  Trouble breathing.  Excessive sweating.  Spasms of the larynx.  A slowed heartbeat.  Low blood pressure. What happens before the procedure?  Follow instructions from your health care provider about eating or drinking restrictions.  Ask your health care provider about:  Changing or stopping your regular medicines. This is especially important if you are taking diabetes medicines or blood thinners.  Taking medicines such as aspirin and ibuprofen. These medicines can thin your blood. Do not take these medicines before your procedure if your health care provider instructs you not to.  Plan to have someone take you home after the procedure.  If you wear dentures, be ready to remove them before the procedure. What happens during the procedure?  To reduce your risk of infection, your health care team will wash or sanitize their hands.  An IV tube will be put in a  vein in your hand or arm. You will get medicines and fluids through this tube.  You will be given one or more of the following:  A medicine to help you relax (sedative).  A medicine to numb the area (local anesthetic). This medicine may be sprayed into your throat. It will make you feel more comfortable and keep you from gagging or coughing during the procedure.  A medicine for pain.  A mouth guard may be  placed in your mouth to protect your teeth and to keep you from biting on the endoscope.  You will be asked to lie on your left side.  The endoscope will be lowered down your throat into your esophagus, stomach, and duodenum.  Air will be put into the endoscope. This will help your health care provider see better.  The lining of your esophagus, stomach, and duodenum will be examined.  Your health care provider may:  Take a tissue sample so it can be looked at in a lab (biopsy).  Remove growths.  Remove objects (foreign bodies) that are stuck.  Treat any bleeding with medicines or other devices that stop tissue from bleeding.  Widen (dilate) or stretch narrowed areas of your esophagus and stomach.  The endoscope will be taken out. The procedure may vary among health care providers and hospitals. What happens after the procedure?  Your blood pressure, heart rate, breathing rate, and blood oxygen level will be monitored often until the medicines you were given have worn off.  Do not eat or drink anything until the numbing medicine has worn off and your gag reflex has returned. This information is not intended to replace advice given to you by your health care provider. Make sure you discuss any questions you have with your health care provider. Document Released: 06/01/2004 Document Revised: 07/07/2015 Document Reviewed: 12/23/2014  2017 Elsevier Esophagogastroduodenoscopy, Care After Introduction Refer to this sheet in the next few weeks. These instructions provide you with information about caring for yourself after your procedure. Your health care provider may also give you more specific instructions. Your treatment has been planned according to current medical practices, but problems sometimes occur. Call your health care provider if you have any problems or questions after your procedure. What can I expect after the procedure? After the procedure, it is common to have:  A  sore throat.  Nausea.  Bloating.  Dizziness.  Fatigue. Follow these instructions at home:  Do not eat or drink anything until the numbing medicine (local anesthetic) has worn off and your gag reflex has returned. You will know that the local anesthetic has worn off when you can swallow comfortably.  Do not drive for 24 hours if you received a medicine to help you relax (sedative).  If your health care provider took a tissue sample for testing during the procedure, make sure to get your test results. This is your responsibility. Ask your health care provider or the department performing the test when your results will be ready.  Keep all follow-up visits as told by your health care provider. This is important. Contact a health care provider if:  You cannot stop coughing.  You are not urinating.  You are urinating less than usual. Get help right away if:  You have trouble swallowing.  You cannot eat or drink.  You have throat or chest pain that gets worse.  You are dizzy or light-headed.  You faint.  You have nausea or vomiting.  You have chills.  You have  a fever.  You have severe abdominal pain.  You have black, tarry, or bloody stools. This information is not intended to replace advice given to you by your health care provider. Make sure you discuss any questions you have with your health care provider. Document Released: 01/16/2012 Document Revised: 07/07/2015 Document Reviewed: 12/23/2014  2017 Elsevier  Colonoscopy, Adult A colonoscopy is an exam to look at the large intestine. It is done to check for problems, such as:  Lumps (tumors).  Growths (polyps).  Swelling (inflammation).  Bleeding. What happens before the procedure? Eating and drinking Follow instructions from your doctor about eating and drinking. These instructions may include:  A few days before the procedure - follow a low-fiber diet.  Avoid nuts.  Avoid seeds.  Avoid dried  fruit.  Avoid raw fruits.  Avoid vegetables.  1-3 days before the procedure - follow a clear liquid diet. Avoid liquids that have red or purple dye. Drink only clear liquids, such as:  Clear broth or bouillon.  Black coffee or tea.  Clear juice.  Clear soft drinks or sports drinks.  [place].  Popsicles.  On the day of the procedure - do not eat or drink anything during the 2 hours before the procedure. Bowel prep If you were prescribed an oral bowel prep:  Take it as told by your doctor. Starting the day before your procedure, you will need to drink a lot of liquid. The liquid will cause you to poop (have bowel movements) until your poop is almost clear or light green.  If your skin or butt gets irritated from diarrhea, you may:  Wipe the area with wipes that have medicine in them, such as adult wet wipes with aloe and vitamin E.  Put something on your skin that soothes the area, such as petroleum jelly.  If you throw up (vomit) while drinking the bowel prep, take a break for up to 60 minutes. Then begin the bowel prep again. If you keep throwing up and you cannot take the bowel prep without throwing up, call your doctor. General instructions  Ask your doctor about changing or stopping your normal medicines. This is important if you take diabetes medicines or blood thinners.  Plan to have someone take you home from the hospital or clinic. What happens during the procedure?  An IV tube may be put into one of your veins.  You will be given medicine to help you relax (sedative).  To reduce your risk of infection:  Your doctors will wash their hands.  Your anal area will be washed with soap.  You will be asked to lie on your side with your knees bent.  Your doctor will get a long, thin, flexible tube ready. The tube will have a camera and a light on the end.  The tube will be put into your anus.  The tube will be gently put into your large intestine.  Air  will be delivered into your large intestine to keep it open. You may feel some pressure or cramping.  The camera will be used to take photos.  A small tissue sample may be removed from your body to be looked at under a microscope (biopsy). If any possible problems are found, the tissue will be sent to a lab for testing.  If small growths are found, your doctor may remove them and have them checked for cancer.  The tube that was put into your anus will be slowly removed. The procedure may vary  among doctors and hospitals. What happens after the procedure?  Your doctor will check on you often until the medicines you were given have worn off.  Do not drive for 24 hours after the procedure.  You may have a small amount of blood in your poop.  You may pass gas.  You may have mild cramps or bloating in your belly (abdomen).  It is up to you to get the results of your procedure. Ask your doctor, or the department performing the procedure, when your results will be ready. This information is not intended to replace advice given to you by your health care provider. Make sure you discuss any questions you have with your health care provider. Document Released: 03/03/2010 Document Revised: 10/06/2015 Document Reviewed: 04/12/2015 Elsevier Interactive Patient Education  2017 [employer]  Colonoscopy, Adult, Care After This sheet gives you information about how to care for yourself after your procedure. Your health care provider may also give you more specific instructions. If you have problems or questions, contact your health care provider. What can I expect after the procedure? After the procedure, it is common to have:  A small amount of blood in your stool for 24 hours after the procedure.  Some gas.  Mild abdominal cramping or bloating. Follow these instructions at home: General instructions  For the first 24 hours after the procedure:  Do not drive or use machinery.  Do not  sign important documents.  Do not drink alcohol.  Do your regular daily activities at a slower pace than normal.  Eat soft, easy-to-digest foods.  Rest often.  Take over-the-counter or prescription medicines only as told by your health care provider.  It is up to you to get the results of your procedure. Ask your health care provider, or the department performing the procedure, when your results will be ready. Relieving cramping and bloating  Try walking around when you have cramps or feel bloated.  Apply heat to your abdomen as told by your health care provider. Use a heat source that your health care provider recommends, such as a moist heat pack or a heating pad.  Place a towel between your skin and the heat source.  Leave the heat on for 20-30 minutes.  Remove the heat if your skin turns bright red. This is especially important if you are unable to feel pain, heat, or cold. You may have a greater risk of getting burned. Eating and drinking  Drink enough fluid to keep your urine clear or pale yellow.  Resume your normal diet as instructed by your health care provider. Avoid heavy or fried foods that are hard to digest.  Avoid drinking alcohol for as long as instructed by your health care provider. Contact a health care provider if:  You have blood in your stool 2-3 days after the procedure. Get help right away if:  You have more than a small spotting of blood in your stool.  You pass large blood clots in your stool.  Your abdomen is swollen.  You have nausea or vomiting.  You have a fever.  You have increasing abdominal pain that is not relieved with medicine. This information is not intended to replace advice given to you by your health care provider. Make sure you discuss any questions you have with your health care provider. Document Released: 09/13/2003 Document Revised: 10/24/2015 Document Reviewed: 04/12/2015 Elsevier Interactive Patient Education  2017  [employer]  Colonoscopy, Adult, Care After This sheet gives you information about  how to care for yourself after your procedure. Your doctor may also give you more specific instructions. If you have problems or questions, call your doctor. Follow these instructions at home: General instructions  For the first 24 hours after the procedure:  Do not drive or use machinery.  Do not sign important documents.  Do not drink alcohol.  Do your daily activities more slowly than normal.  Eat foods that are soft and easy to digest.  Rest often.  Take over-the-counter or prescription medicines only as told by your doctor.  It is up to you to get the results of your procedure. Ask your doctor, or the department performing the procedure, when your results will be ready. To help cramping and bloating:  Try walking around.  Put heat on your belly (abdomen) as told by your doctor. Use a heat source that your doctor recommends, such as a moist heat pack or a heating pad.  Put a towel between your skin and the heat source.  Leave the heat on for 20-30 minutes.  Remove the heat if your skin turns bright red. This is especially important if you cannot feel pain, heat, or cold. You can get burned. Eating and drinking  Drink enough fluid to keep your pee (urine) clear or pale yellow.  Return to your normal diet as told by your doctor. Avoid heavy or fried foods that are hard to digest.  Avoid drinking alcohol for as long as told by your doctor. Contact a doctor if:  You have blood in your poop (stool) 2-3 days after the procedure. Get help right away if:  You have more than a small amount of blood in your poop.  You see large clumps of tissue (blood clots) in your poop.  Your belly is swollen.  You feel sick to your stomach (nauseous).  You throw up (vomit).  You have a fever.  You have belly pain that gets worse, and medicine does not help your pain. This information is not  intended to replace advice given to you by your health care provider. Make sure you discuss any questions you have with your health care provider. Document Released: 03/03/2010 Document Revised: 10/24/2015 Document Reviewed: 10/24/2015 Elsevier Interactive Patient Education  2017 [place] Anesthesia is a term that refers to techniques, procedures, and medicines that help a person stay safe and comfortable during a medical procedure. Monitored anesthesia care, or sedation, is one type of anesthesia. Your anesthesia specialist may recommend sedation if you will be having a procedure that does not require you to be unconscious, such as:  Cataract surgery.  A dental procedure.  A biopsy.  A colonoscopy. During the procedure, you may receive a medicine to help you relax (sedative). There are three levels of sedation:  Mild sedation. At this level, you may feel awake and relaxed. You will be able to follow directions.  Moderate sedation. At this level, you will be sleepy. You may not remember the procedure.  Deep sedation. At this level, you will be asleep. You will not remember the procedure. The more medicine you are given, the deeper your level of sedation will be. Depending on how you respond to the procedure, the anesthesia specialist may change your level of sedation or the type of anesthesia to fit your needs. An anesthesia specialist will monitor you closely during the procedure. Let your health care provider know about:  Any allergies you have.  All medicines you are taking, including vitamins,  herbs, eye drops, creams, and over-the-counter medicines.  Any use of steroids (by mouth or as a cream).  Any problems you or family members have had with sedatives and anesthetic medicines.  Any blood disorders you have.  Any surgeries you have had.  Any medical conditions you have, such as sleep apnea.  Whether you are pregnant or may be  pregnant.  Any use of cigarettes, alcohol, or street drugs. What are the risks? Generally, this is a safe procedure. However, problems may occur, including:  Getting too much medicine (oversedation).  Nausea.  Allergic reaction to medicines.  Trouble breathing. If this happens, a breathing tube may be used to help with breathing. It will be removed when you are awake and breathing on your own.  Heart trouble.  Lung trouble. Before the procedure Staying hydrated  Follow instructions from your health care provider about hydration, which may include:  Up to 2 hours before the procedure - you may continue to drink clear liquids, such as water, clear fruit juice, black coffee, and plain tea. Eating and drinking restrictions  Follow instructions from your health care provider about eating and drinking, which may include:  8 hours before the procedure - stop eating heavy meals or foods such as meat, fried foods, or fatty foods.  6 hours before the procedure - stop eating light meals or foods, such as toast or cereal.  6 hours before the procedure - stop drinking milk or drinks that contain milk.  2 hours before the procedure - stop drinking clear liquids. Medicines  Ask your health care provider about:  Changing or stopping your regular medicines. This is especially important if you are taking diabetes medicines or blood thinners.  Taking medicines such as aspirin and ibuprofen. These medicines can thin your blood. Do not take these medicines before your procedure if your health care provider instructs you not to. Tests and exams  You will have a physical exam.  You may have blood tests done to show:  How well your kidneys and liver are working.  How well your blood can clot.  General instructions  Plan to have someone take you home from the hospital or clinic.  If you will be going home right after the procedure, plan to have someone with you for 24 hours. What happens  during the procedure?  Your blood pressure, heart rate, breathing, level of pain and overall condition will be monitored.  An IV tube will be inserted into one of your veins.  Your anesthesia specialist will give you medicines as needed to keep you comfortable during the procedure. This may mean changing the level of sedation.  The procedure will be performed. After the procedure  Your blood pressure, heart rate, breathing rate, and blood oxygen level will be monitored until the medicines you were given have worn off.  Do not drive for 24 hours if you received a sedative.  You may:  Feel sleepy, clumsy, or nauseous.  Feel forgetful about what happened after the procedure.  Have a sore throat if you had a breathing tube during the procedure.  Vomit. This information is not intended to replace advice given to you by your health care provider. Make sure you discuss any questions you have with your health care provider. Document Released: 10/25/2004 Document Revised: 07/08/2015 Document Reviewed: 05/22/2015 Elsevier Interactive Patient Education  2017 [place], Care After These instructions provide you with information about caring for yourself after your procedure. Your health care  provider may also give you more specific instructions. Your treatment has been planned according to current medical practices, but problems sometimes occur. Call your health care provider if you have any problems or questions after your procedure. What can I expect after the procedure? After your procedure, it is common to:  Feel sleepy for several hours.  Feel clumsy and have poor balance for several hours.  Feel forgetful about what happened after the procedure.  Have poor judgment for several hours.  Feel nauseous or vomit.  Have a sore throat if you had a breathing tube during the procedure. Follow these instructions at home: For at least 24 hours after the  procedure:   Do not:  Participate in activities in which you could fall or become injured.  Drive.  Use heavy machinery.  Drink alcohol.  Take sleeping pills or medicines that cause drowsiness.  Make important decisions or sign legal documents.  Take care of children on your own.  Rest. Eating and drinking  Follow the diet that is recommended by your health care provider.  If you vomit, drink water, juice, or soup when you can drink without vomiting.  Make sure you have little or no nausea before eating solid foods. General instructions  Have a responsible adult stay with you until you are awake and alert.  Take over-the-counter and prescription medicines only as told by your health care provider.  If you smoke, do not smoke without supervision.  Keep all follow-up visits as told by your health care provider. This is important. Contact a health care provider if:  You keep feeling nauseous or you keep vomiting.  You feel light-headed.  You develop a rash.  You have a fever. Get help right away if:  You have trouble breathing. This information is not intended to replace advice given to you by your health care provider. Make sure you discuss any questions you have with your health care provider. Document Released: 05/22/2015 Document Revised: 09/21/2015 Document Reviewed: 05/22/2015 Elsevier Interactive Patient Education  2017 [employer].

## 2016-02-10 ENCOUNTER — Encounter (HOSPITAL_COMMUNITY)
Admission: RE | Admit: 2016-02-10 | Discharge: 2016-02-10 | Disposition: A | Discharge status: Home / Self Care | Payer: Medicare Other | Source: Ambulatory Visit | Attending: Internal Medicine | Admitting: Internal Medicine

## 2016-02-10 ENCOUNTER — Encounter (HOSPITAL_COMMUNITY): Payer: Self-pay

## 2016-02-10 DIAGNOSIS — Z01812 Encounter for preprocedural laboratory examination: Secondary | ICD-10-CM | POA: Diagnosis not present

## 2016-02-10 HISTORY — DX: Gastro-esophageal reflux disease without esophagitis: K21.9

## 2016-02-10 LAB — BASIC METABOLIC PANEL
Anion gap: 5 (ref 5–15)
BUN: 27 mg/dL — ABNORMAL HIGH (ref 6–20)
CO2: 29 mmol/L (ref 22–32)
CREATININE: 1.76 mg/dL — AB (ref 0.61–1.24)
Calcium: 8.7 mg/dL — ABNORMAL LOW (ref 8.9–10.3)
Chloride: 99 mmol/L — ABNORMAL LOW (ref 101–111)
GFR calc Af Amer: 44 mL/min — ABNORMAL LOW (ref >60)
GFR calc non Af Amer: 38 mL/min — ABNORMAL LOW (ref >60)
GLUCOSE: 182 mg/dL — AB (ref 65–99)
Potassium: 4.5 mmol/L (ref 3.5–5.1)
Sodium: 133 mmol/L — ABNORMAL LOW (ref 135–145)

## 2016-02-10 LAB — CBC WITH DIFFERENTIAL/PLATELET
BASOS PCT: 1 %
Basophils Absolute: 0 10*3/uL (ref 0.0–0.1)
Eosinophils Absolute: 0.1 10*3/uL (ref 0.0–0.7)
Eosinophils Relative: 3 %
HEMATOCRIT: 28.6 % — AB (ref 39.0–52.0)
Hemoglobin: 10 g/dL — ABNORMAL LOW (ref 13.0–17.0)
LYMPHS ABS: 1.2 10*3/uL (ref 0.7–4.0)
LYMPHS PCT: 32 %
MCH: 31.8 pg (ref 26.0–34.0)
MCHC: 35 g/dL (ref 30.0–36.0)
MCV: 91.1 fL (ref 78.0–100.0)
MONO ABS: 0.3 10*3/uL (ref 0.1–1.0)
MONOS PCT: 8 %
NEUTROS ABS: 2.1 10*3/uL (ref 1.7–7.7)
Neutrophils Relative %: 56 %
Platelets: 256 10*3/uL (ref 150–400)
RBC: 3.14 MIL/uL — ABNORMAL LOW (ref 4.22–5.81)
RDW: 13.9 % (ref 11.5–15.5)
WBC: 3.7 10*3/uL — ABNORMAL LOW (ref 4.0–10.5)

## 2016-02-14 ENCOUNTER — Other Ambulatory Visit: Payer: Self-pay

## 2016-02-14 NOTE — Progress Notes (Signed)
I have added the orders for them to be done in the morning

## 2016-02-15 ENCOUNTER — Encounter (HOSPITAL_COMMUNITY)
Admission: RE | Disposition: A | Discharge status: Home / Self Care | Payer: Self-pay | Source: Ambulatory Visit | Attending: Internal Medicine

## 2016-02-15 ENCOUNTER — Ambulatory Visit (HOSPITAL_COMMUNITY)
Admission: RE | Admit: 2016-02-15 | Discharge: 2016-02-15 | Disposition: A | Discharge status: Home / Self Care | Payer: Medicare Other | Source: Ambulatory Visit | Attending: Internal Medicine | Admitting: Internal Medicine

## 2016-02-15 ENCOUNTER — Encounter (HOSPITAL_COMMUNITY): Payer: Self-pay | Admitting: *Deleted

## 2016-02-15 DIAGNOSIS — Z6831 Body mass index (BMI) 31.0-31.9, adult: Secondary | ICD-10-CM | POA: Diagnosis not present

## 2016-02-15 DIAGNOSIS — I5042 Chronic combined systolic (congestive) and diastolic (congestive) heart failure: Secondary | ICD-10-CM | POA: Insufficient documentation

## 2016-02-15 DIAGNOSIS — I340 Nonrheumatic mitral (valve) insufficiency: Secondary | ICD-10-CM | POA: Insufficient documentation

## 2016-02-15 DIAGNOSIS — Z794 Long term (current) use of insulin: Secondary | ICD-10-CM | POA: Insufficient documentation

## 2016-02-15 DIAGNOSIS — Z951 Presence of aortocoronary bypass graft: Secondary | ICD-10-CM | POA: Diagnosis not present

## 2016-02-15 DIAGNOSIS — D123 Benign neoplasm of transverse colon: Secondary | ICD-10-CM

## 2016-02-15 DIAGNOSIS — D509 Iron deficiency anemia, unspecified: Secondary | ICD-10-CM | POA: Diagnosis not present

## 2016-02-15 DIAGNOSIS — E785 Hyperlipidemia, unspecified: Secondary | ICD-10-CM | POA: Insufficient documentation

## 2016-02-15 DIAGNOSIS — J45909 Unspecified asthma, uncomplicated: Secondary | ICD-10-CM | POA: Diagnosis not present

## 2016-02-15 DIAGNOSIS — N183 Chronic kidney disease, stage 3 (moderate): Secondary | ICD-10-CM | POA: Diagnosis not present

## 2016-02-15 DIAGNOSIS — D649 Anemia, unspecified: Secondary | ICD-10-CM

## 2016-02-15 DIAGNOSIS — I130 Hypertensive heart and chronic kidney disease with heart failure and stage 1 through stage 4 chronic kidney disease, or unspecified chronic kidney disease: Secondary | ICD-10-CM | POA: Diagnosis not present

## 2016-02-15 DIAGNOSIS — Z87891 Personal history of nicotine dependence: Secondary | ICD-10-CM | POA: Diagnosis not present

## 2016-02-15 DIAGNOSIS — Z7982 Long term (current) use of aspirin: Secondary | ICD-10-CM | POA: Insufficient documentation

## 2016-02-15 DIAGNOSIS — K921 Melena: Secondary | ICD-10-CM

## 2016-02-15 DIAGNOSIS — K5730 Diverticulosis of large intestine without perforation or abscess without bleeding: Secondary | ICD-10-CM | POA: Insufficient documentation

## 2016-02-15 DIAGNOSIS — I2510 Atherosclerotic heart disease of native coronary artery without angina pectoris: Secondary | ICD-10-CM | POA: Insufficient documentation

## 2016-02-15 DIAGNOSIS — I252 Old myocardial infarction: Secondary | ICD-10-CM | POA: Diagnosis not present

## 2016-02-15 DIAGNOSIS — E669 Obesity, unspecified: Secondary | ICD-10-CM | POA: Diagnosis not present

## 2016-02-15 HISTORY — PX: COLONOSCOPY: SHX5424

## 2016-02-15 HISTORY — PX: ESOPHAGOGASTRODUODENOSCOPY: SHX5428

## 2016-02-15 LAB — GLUCOSE, CAPILLARY: Glucose-Capillary: 120 mg/dL — ABNORMAL HIGH (ref 65–99)

## 2016-02-15 SURGERY — COLONOSCOPY
Anesthesia: Moderate Sedation

## 2016-02-15 MED ORDER — MIDAZOLAM HCL 5 MG/5ML IJ SOLN
INTRAMUSCULAR | Status: AC
  Filled 2016-02-15: qty 10

## 2016-02-15 MED ORDER — ONDANSETRON HCL 4 MG/2ML IJ SOLN
INTRAMUSCULAR | Status: DC | PRN
  Administered 2016-02-15: 4 mg via INTRAVENOUS

## 2016-02-15 MED ORDER — PROMETHAZINE HCL 25 MG/ML IJ SOLN
INTRAMUSCULAR | Status: AC
  Filled 2016-02-15: qty 1

## 2016-02-15 MED ORDER — LIDOCAINE VISCOUS 2 % MT SOLN
OROMUCOSAL | Status: AC
  Filled 2016-02-15: qty 15

## 2016-02-15 MED ORDER — FENTANYL CITRATE (PF) 100 MCG/2ML IJ SOLN
INTRAMUSCULAR | Status: AC
  Filled 2016-02-15: qty 2

## 2016-02-15 MED ORDER — MIDAZOLAM HCL 5 MG/5ML IJ SOLN
INTRAMUSCULAR | Status: AC
  Filled 2016-02-15: qty 5

## 2016-02-15 MED ORDER — PROMETHAZINE HCL 25 MG/ML IJ SOLN
12.5000 mg | Freq: Once | INTRAMUSCULAR | Status: AC
  Administered 2016-02-15: 12.5 mg via INTRAVENOUS

## 2016-02-15 MED ORDER — MIDAZOLAM HCL 5 MG/5ML IJ SOLN
INTRAMUSCULAR | Status: DC | PRN
  Administered 2016-02-15 (×4): 1 mg via INTRAVENOUS
  Administered 2016-02-15: 2 mg via INTRAVENOUS

## 2016-02-15 MED ORDER — SODIUM CHLORIDE 0.9% FLUSH
INTRAVENOUS | Status: AC
  Filled 2016-02-15: qty 10

## 2016-02-15 MED ORDER — SODIUM CHLORIDE 0.9 % IV SOLN
INTRAVENOUS | Status: DC
Start: 2016-02-15 — End: 2016-02-15
  Administered 2016-02-15: 1000 mL via INTRAVENOUS

## 2016-02-15 MED ORDER — MEPERIDINE HCL 100 MG/ML IJ SOLN
INTRAMUSCULAR | Status: AC
  Filled 2016-02-15: qty 2

## 2016-02-15 MED ORDER — FENTANYL CITRATE (PF) 100 MCG/2ML IJ SOLN
INTRAMUSCULAR | Status: DC | PRN
  Administered 2016-02-15: 50 ug via INTRAVENOUS

## 2016-02-15 MED ORDER — ONDANSETRON HCL 4 MG/2ML IJ SOLN
INTRAMUSCULAR | Status: AC
  Filled 2016-02-15: qty 2

## 2016-02-15 NOTE — H&P (View-Only) (Signed)
Primary Care Physician:  Gildardo Cranker, DO Referring physician: Kathrynn Humble, NP and Dr. Candee Furbish Primary Gastroenterologist:  Garfield Cornea, MD   Chief Complaint  Patient presents with  . Anemia    HPI:  Henry Carroll is a 66 y.o. 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. 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  Patient was hospitalized back in November for edema, cough, fatigue, dyspnea when laying down. In the ED his hemoglobin was 6.5. He received 2 units of packed red blood cells. At time of discharge his hemoglobin was 8.9. His B12, folate were normal. Iron was low, ferritin low normal. He was Hemoccult negative. Notably prior to his CABG in January's hemoglobin was 11.3, at time of discharge after his CABG his hemoglobin was 8.6. Patient was also Hemoccult negative prior to this admission during his physical.  Patient states he was doing fine up until a day or 2 before his admission. He really did not notice any significant fatigue or dyspnea on exertion. Couple of days before his admission he started having lower extremity edema, difficulty laying down without coughing and shortness of breath. He remembers back the last week in October when he was out of town he started having pain in the left upper leg and black stools at that time. Lasted for a few days and went away. Denies Pepto-Bismol use. He uses Tylenol occasionally for pain. Denies NSAIDs or aspirin powders. He takes aspirin 325 mg daily.  Patient states he feels much better after his blood transfusion. He also denies abdominal pain, bright red blood per rectum, dysphagia, vomiting, weight loss. Occasional heartburn related to certain  foods such as barbecue. No alcohol use since October 2016. Does not feel like he was drinking excessively but his wife asked him to stop.  Patient has screening colonoscopy in 2003 by Dr. [name]. He had few scattered pancolonic diverticula. Suspicious lesion on the proximal side of the ileocecal valve. Biopsied multiple times. Biopsies were benign. In 2011 he had another colonoscopy. Ileocecal valve was seen adequately and appear to be unremarkable. He had diverticulosis. Small lesion could've been missed due to difficult bowel prep. He was advised to come back in 3 years, letter was sent but we did not get a response.  Current Outpatient Prescriptions  Medication Sig Dispense Refill  . acetaminophen (TYLENOL) 500 MG tablet Take 1,000 mg by mouth 2 (two) times daily as needed for moderate pain.    Marland Kitchen albuterol (PROVENTIL) (2.5 MG/3ML) 0.083% nebulizer solution USE ONE VIAL (3 ML) IN NEBULIZER EVERY 6 HOURS AS NEEDED FOR WHEEZING OR SHORTNESS OF BREATH. Dx:J45.21, R06.2 300 mL 11  . amLODipine (NORVASC) 10 MG tablet Take 0.5 tablets (5 mg total) by mouth daily. 30 tablet 2  . aspirin EC 325 MG EC tablet Take 1 tablet (325 mg total) by mouth daily.    Marland Kitchen atorvastatin (LIPITOR) 40 MG tablet Take 1 tablet (40 mg total) by mouth every evening. 90 tablet 3  . benazepril (LOTENSIN) 40 MG tablet TAKE ONE TABLET BY MOUTH ONCE DAILY FOR BLOOD PRESSURE 30 tablet 5  . carvedilol (COREG) 12.5 MG tablet TAKE ONE TABLET BY MOUTH TWICE DAILY WITH A MEAL 60 tablet 8  . Cholecalciferol ([employer] VITAMIN D3) 1000 UNITS  capsule Take 2 capsules (2,000 Units total) by mouth daily. 60 capsule 3  . CINNAMON PO Take 1 capsule by mouth 2 (two) times daily.     . Coenzyme Q10 (CO Q 10 PO) Take 200 mg by mouth 2 (two) times daily.     . fluticasone (FLONASE) 50 MCG/ACT nasal spray Place 2 sprays into both nostrils daily as needed for allergies or rhinitis (SEASONAL ALLERGIES).    . Fluticasone-Salmeterol (ADVAIR DISKUS) 100-50  MCG/DOSE AEPB Inhale 1 puff into the lungs 2 (two) times daily. 60 each 3  . furosemide (LASIX) 40 MG tablet Take 1 tablet (40 mg total) by mouth daily. 60 tablet 6  . hydrALAZINE (APRESOLINE) 25 MG tablet Take 25 mg by mouth 2 (two) times daily.    . insulin aspart (NOVOLOG) 100 UNIT/ML FlexPen Take 5 units above 250, 10 units above 300. (Patient taking differently: Inject 5-10 Units into the skin 2 (two) times daily before a meal. 5 units if BGL is 200 or greater and 10 units if 250 or greater) 15 mL 0  . insulin detemir (LEVEMIR) 100 UNIT/ML injection Inject 60 Units into the skin 2 (two) times daily.     . montelukast (SINGULAIR) 10 MG tablet Take 1 tablet (10 mg total) by mouth at bedtime. 30 tablet 6  . Multiple Vitamins-Minerals (ONE-A-DAY MENS 50+ ADVANTAGE) TABS Take 1 tablet by mouth daily.    Marland Kitchen nystatin (MYCOSTATIN/NYSTOP) powder Apply topically 3 (three) times daily. To groin folds x 4 weeks or until healed 30 g 1  . ONE TOUCH ULTRA TEST test strip USE ONE STRIP TO CHECK GLUCOSE THREE TIMES DAILY 100 each 11  . PROAIR HFA 108 (90 Base) MCG/ACT inhaler INHALE TWO PUFFS BY MOUTH EVERY 6 HOURS AS NEEDED FOR SHORTNESS OF BREATH 27 each 2  . terbinafine (LAMISIL) 1 % cream Apply 1 application topically daily as needed (foot).     . TRADJENTA 5 MG TABS tablet TAKE ONE TABLET BY MOUTH ONCE DAILY 30 tablet 6   No current facility-administered medications for this visit.     Allergies as of 01/23/2016  . (No Known Allergies)    Past Medical History:  Diagnosis Date  . Anemia   . 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 Asthma   . Chronic combined systolic (congestive) and diastolic (congestive) heart failure    a. 12/31/14: 2D ECHO: EF 40-45%, HK of inf myocardium, G1DD, mod MR  . CKD (chronic kidney disease), stage III   . Coronary artery disease    a. LHC 01/2015 - triple vessel CAD (mod oLM, mLAD, severe mRCA, intermediate branch stenosis, CTO of mCx). Plan CABG 02/2015.  Marland Kitchen Hyperlipidemia    . Hypertension   . Mitral regurgitation    a. Mild-mod by echo 12/2014.  Marland Kitchen Myocardial infarction    pt. states per [name] he has in the past  . NSVT (nonsustained ventricular tachycardia) ([place])    a. 9 beats during 01/2015 adm. BB titrated.  . Obesity    a. BMI 33  . Type II diabetes mellitus ([place])     Past Surgical History:  Procedure Laterality Date  . BACK SURGERY    . CARDIAC CATHETERIZATION N/A 02/09/2015   Procedure: Left Heart Cath and Coronary Angiography;  Surgeon: Burnell Blanks, MD;  Location: [facility];  Service: Cardiovascular;  Laterality: N/A;  . COLONOSCOPY  "3-4 times"  . CORONARY ARTERY BYPASS GRAFT N/A 02/18/2015   Procedure: CORONARY  ARTERY BYPASS GRAFTING (CABG) x  four, using bilateral internal mammary arteries and right leg greater saphenous vein harvested endoscopically;  Surgeon: [name], MD;  Location: [facility];  Service: Open Heart Surgery;  Laterality: N/A;  . HERNIA REPAIR  [id number]   Umbilical  . LUMBAR [place] SURGERY  2006   L4 & L5  . TEE WITHOUT CARDIOVERSION N/A 02/18/2015   Procedure: TRANSESOPHAGEAL ECHOCARDIOGRAM (TEE);  Surgeon: [name], MD;  Location: [facility];  Service: Open Heart Surgery;  Laterality: N/A;  . TONSILLECTOMY  1962    Family History  Problem Relation Age of Onset  . Diabetes Mother   . Heart disease Mother     later in life, age >14  . Heart disease Father     heart failure later in life  . Hypertension Father   . Stroke Sister   . Kidney disease Daughter   . Sudden death Daughter   . Heart attack Neg Hx     Social History   Social History  . Marital status: Married    Spouse name: N/A  . Number of children: N/A  . Years of education: N/A   Occupational History  . Not on file.   Social History Main Topics  . Smoking status: Former Smoker    Years: 17.00    Types: Cigars    Quit date: 12/31/2014  . Smokeless tobacco: Never Used  . Alcohol use 0.0 oz/week     Comment: 12/29/2015  "stopped 12/2014"  . Drug use: No  . Sexual activity: Yes    Birth control/ protection: None   Other Topics Concern  . Not on file   Social History Narrative  . No narrative on file      ROS:  General: Negative for anorexia, weight loss, fever, chills,  weakness.Fatigue although improved since transfusion Eyes: Negative for vision changes.  ENT: Negative for hoarseness, difficulty swallowing , nasal congestion. CV: Negative for chest pain, angina, palpitations, dyspnea on exertion, Positive peripheral edema.  Respiratory: Negative for dyspnea at rest, dyspnea on exertion, cough, sputum, wheezing.  GI: See history of present illness. GU:  Negative for dysuria, hematuria, urinary incontinence, urinary frequency, nocturnal urination.  MS: Negative for joint pain, low back pain.  Derm: Negative for rash or itching.  Neuro: Negative for weakness, abnormal sensation, seizure, frequent headaches, memory loss, confusion.  Psych: Negative for anxiety, depression, suicidal ideation, hallucinations.  Endo: Negative for unusual weight change.  Heme: Negative for bruising or bleeding. Allergy: Negative for rash or hives.    Physical Examination:  BP (!) 142/76   Pulse 80   Temp 98.3 F (36.8 C) (Oral)   Ht 6\' 1"  (1.854 m)   Wt 244 lb (110.7 kg)   BMI 32.19 kg/m    General: Well-nourished, well-developed in no acute distress.  Head: Normocephalic, atraumatic.   Eyes: Conjunctiva pink, no icterus. Mouth: Oropharyngeal mucosa moist and pink , no lesions erythema or exudate. Neck: Supple without thyromegaly, masses, or lymphadenopathy.  Lungs: Clear to auscultation bilaterally.  Heart: Regular rate and rhythm, no murmurs rubs or gallops.  Abdomen: Bowel sounds are normal, nontender, nondistended, no hepatosplenomegaly or masses, no abdominal bruits or    hernia , no rebound or guarding.   Rectal: Deferred Extremities: No lower extremity edema. No clubbing or deformities.  Neuro:  Alert and oriented x 4 , grossly normal neurologically.  Skin: Warm and dry, no rash or jaundice.   Psych: Alert and cooperative, normal mood and  affect.  Labs: Lab Results  Component Value Date   WBC 5.0 01/16/2016   HGB 8.7 (L) 01/16/2016   HCT 26.6 (L) 01/16/2016   MCV 90.2 01/16/2016   PLT 351 01/16/2016   Lab Results  Component Value Date   IRON 40 (L) 01/09/2016   TIBC 338 01/09/2016   FERRITIN 18 (L) 01/09/2016   Lab Results  Component Value Date   CREATININE 1.92 (H) 01/16/2016   BUN 56 (H) 01/16/2016   NA 141 01/16/2016   K 4.8 01/16/2016   CL 109 01/16/2016   CO2 26 01/16/2016   Lab Results  Component Value Date   ALT 19 12/19/2015   AST 22 12/19/2015   ALKPHOS 61 12/19/2015   BILITOT 0.2 12/19/2015   Lab Results  Component Value Date   BOERQSXQ82 081 12/31/2015   Lab Results  Component Value Date   FOLATE 16.1 12/31/2015      Imaging Studies: Dg Chest 2 View  Result Date: 12/29/2015 CLINICAL DATA:  Shortness of breath and productive cough. Bilateral lower extremity swelling. Chest tightness. EXAM: CHEST  2 VIEW COMPARISON:  PA and lateral chest 03/23/2015 and 12/29/2014. FINDINGS: The patient is status post CABG. Heart size is upper normal. No pneumothorax or pleural effusion. IMPRESSION: No acute disease. Electronically Signed   By: Inge Rise M.D.   On: 12/29/2015 14:09

## 2016-02-15 NOTE — Interval H&P Note (Signed)
History and Physical Interval Note:  02/15/2016 1:16 PM  Henry Carroll  has presented today for surgery, with the diagnosis of transfusion dependent anemia, IDA, melena  The various methods of treatment have been discussed with the patient and family. After consideration of risks, benefits and other options for treatment, the patient has consented to  Procedure(s) with comments: COLONOSCOPY (N/A) - 12:45 pm ESOPHAGOGASTRODUODENOSCOPY (EGD) (N/A) as a surgical intervention .  The patient's history has been reviewed, patient examined, no change in status, stable for surgery.  I have reviewed the patient's chart and labs.  Questions were answered to the patient's satisfaction.     Henry Carroll  No change. EGD and colonoscopy per plan.  The risks, benefits, limitations, imponderables and alternatives regarding both EGD and colonoscopy have been reviewed with the patient. Questions have been answered. All parties agreeable.

## 2016-02-15 NOTE — Op Note (Signed)
[facility] Patient Name: Henry Carroll Procedure Date: 02/15/2016 12:11 PM MRN: [id number] Date of Birth: 26-Apr-1950 Attending MD: Norvel Richards , MD CSN: [id number] Age: 66 Admit Type: Outpatient Procedure:                Upper GI endoscopy Indications:              Anemia Providers:                Norvel Richards, MD, Lurline Del, RN, Purcell Nails.                            Godfrey, Technician, Aram Candela Referring MD:              Medicines:                Midazolam 3 mg IV, Fentanyl 50 micrograms IV Complications:            No immediate complications. Estimated Blood Loss:     Estimated blood loss: none. Procedure:                Pre-Anesthesia Assessment:                           - Prior to the procedure, a History and Physical                            was performed, and patient medications and                            allergies were reviewed. The patient's tolerance of                            previous anesthesia was also reviewed. The risks                            and benefits of the procedure and the sedation                            options and risks were discussed with the patient.                            All questions were answered, and informed consent                            was obtained. Prior Anticoagulants: The patient has                            taken no previous anticoagulant or antiplatelet                            agents. ASA Grade Assessment: III - A patient with                            severe systemic disease. After reviewing the risks  and benefits, the patient was deemed in                            satisfactory condition to undergo the procedure.                           After obtaining informed consent, the endoscope was                            passed under direct vision. Throughout the                            procedure, the patient's blood pressure, pulse, and        oxygen saturations were monitored continuously. The                            [id number] ([id number]) was introduced through the                            mouth, and advanced to the second part of duodenum.                            The upper GI endoscopy was accomplished without                            difficulty. The patient tolerated the procedure                            well. Scope In: 1:26:03 PM Scope Out: 1:30:16 PM Total Procedure Duration: 0 hours 4 minutes 13 seconds  Findings:      The examined esophagus was normal.      The entire examined stomach was normal.      The duodenal bulb and second portion of the duodenum were normal. Impression:               - Normal esophagus.                           - Normal stomach.                           - Normal duodenal bulb and second portion of the                            duodenum.                           - No specimens collected. Moderate Sedation:      Moderate (conscious) sedation was administered by the endoscopy nurse       and supervised by the endoscopist. The following parameters were       monitored: oxygen saturation, heart rate, blood pressure, respiratory       rate, EKG, adequacy of pulmonary ventilation, and response to care.       Total physician intraservice time was 9 minutes. Recommendation:           - Patient  has a contact number available for                            emergencies. The signs and symptoms of potential                            delayed complications were discussed with the                            patient. Return to normal activities tomorrow.                            Written discharge instructions were provided to the                            patient.                           - Resume previous diet.                           - Continue present medications.                           - No repeat upper endoscopy.                           - Return to GI office (date not yet  determined).                            Ssee colonoscopy repor Procedure Code(s):        --- Professional ---                           [phone], Esophagogastroduodenoscopy, flexible,                            transoral; diagnostic, including collection of                            specimen(s) by brushing or washing, when performed                            (separate procedure) Diagnosis Code(s):        --- Professional ---                           D64.9, Anemia, unspecified CPT copyright 2016 American Medical Association. All rights reserved. The codes documented in this report are preliminary and upon coder review may  be revised to meet current compliance requirements. Cristopher Estimable. Blanchard Willhite, MD Norvel Richards, MD 02/15/2016 1:35:43 PM This report has been signed electronically. Number of Addenda: 0

## 2016-02-15 NOTE — Op Note (Signed)
[facility] Patient Name: Henry Carroll Procedure Date: 02/15/2016 1:32 PM MRN: [id number] Date of Birth: October 09, 1950 Attending MD: Norvel Richards , MD CSN: [id number] Age: 65 Admit Type: Outpatient Procedure:                Colonoscopy with snare polypectomy Indications:              Unexplained anemia Providers:                Norvel Richards, MD, Lurline Del, RN, [facility], Technician, Aram Candela Referring MD:              Medicines:                Midazolam 6 mg IV, Meperidine 50 mg IV, Ondansetron                            4 mg IV Complications:            No immediate complications. Estimated Blood Loss:     Estimated blood loss was minimal. Procedure:                Pre-Anesthesia Assessment:                           - Prior to the procedure, a History and Physical                            was performed, and patient medications and                            allergies were reviewed. The patient's tolerance of                            previous anesthesia was also reviewed. The risks                            and benefits of the procedure and the sedation                            options and risks were discussed with the patient.                            All questions were answered, and informed consent                            was obtained. Prior Anticoagulants: The patient has                            taken no previous anticoagulant or antiplatelet                            agents. ASA Grade Assessment: III - A patient with  severe systemic disease. After reviewing the risks                            and benefits, the patient was deemed in                            satisfactory condition to undergo the procedure.                           After obtaining informed consent, the colonoscope                            was passed under direct vision. Throughout the   procedure, the patient's blood pressure, pulse, and                            oxygen saturations were monitored continuously. The                            [id number] ([id number]) scope was introduced through                            the anus and advanced to the the cecum, identified                            by appendiceal orifice and ileocecal valve. The                            colonoscopy was performed without difficulty. The                            patient tolerated the procedure well. The quality                            of the bowel preparation was adequate. The                            ileocecal valve, appendiceal orifice, and rectum                            were photographed. The quality of the bowel                            preparation was adequate. Scope In: 1:38:40 PM Scope Out: 1:59:27 PM Scope Withdrawal Time: 0 hours 15 minutes 36 seconds  Total Procedure Duration: 0 hours 20 minutes 47 seconds  Findings:      The perianal and digital rectal examinations were normal.      Diverticula were found in the sigmoid colon and descending colon.      A 5 mm polyp was found in the splenic flexure. The polyp was       semi-pedunculated. The polyp was removed with a cold snare. Resection       and retrieval were complete. Estimated blood loss was minimal.      The exam was otherwise without abnormality  on direct and retroflexion       views. Impression:               - Diverticulosis in the sigmoid colon and in the                            descending colon.                           - One 5 mm polyp at the splenic flexure, removed                            with a cold snare. Resected and retrieved.                           - The examination was otherwise normal on direct                            and retroflexion views. No findings in either upper                            or lower GI tract to explain anemia via a mechanism                            of GI  bleeding. Moderate Sedation:      Moderate (conscious) sedation was administered by the endoscopy nurse       and supervised by the endoscopist. The following parameters were       monitored: oxygen saturation, heart rate, blood pressure, respiratory       rate, EKG, adequacy of pulmonary ventilation, and response to care.       Total physician intraservice time was 38 minutes. Recommendation:           - Patient has a contact number available for                            emergencies. The signs and symptoms of potential                            delayed complications were discussed with the                            patient. Return to normal activities tomorrow.                            Written discharge instructions were provided to the                            patient.                           - Resume previous diet.                           - Continue present medications.                           -  Repeat colonoscopy date to be determined after                            pending pathology results are reviewed for                            surveillance based on pathology results.                           - Return to GI office in 2 months. Patient may or                            may not need imaging of his small bowel to complete                            the GI evaluation. Procedure Code(s):        --- Professional ---                           [phone], Colonoscopy, flexible; with removal of                            tumor(s), polyp(s), or other lesion(s) by snare                            technique                           99152, Moderate sedation services provided by the                            same physician or other qualified health care                            professional performing the diagnostic or                            therapeutic service that the sedation supports,                            requiring the presence of an independent trained                             observer to assist in the monitoring of the                            patient's level of consciousness and physiological                            status; initial 15 minutes of intraservice time,                            patient age 55 years or older  74142, Moderate sedation services; each additional                            15 minutes intraservice time                           99153, Moderate sedation services; each additional                            15 minutes intraservice time Diagnosis Code(s):        --- Professional ---                           D12.3, Benign neoplasm of transverse colon (hepatic                            flexure or splenic flexure)                           D50.9, Iron deficiency anemia, unspecified                           K57.30, Diverticulosis of large intestine without                            perforation or abscess without bleeding CPT copyright 2016 American Medical Association. All rights reserved. The codes documented in this report are preliminary and upon coder review may  be revised to meet current compliance requirements. Henry Carroll. Henry Creech, MD Norvel Richards, MD 02/15/2016 2:14:55 PM This report has been signed electronically. Number of Addenda: 0

## 2016-02-15 NOTE — Discharge Instructions (Signed)
°Colonoscopy °Discharge Instructions ° °Read the instructions outlined below and refer to this sheet in the next few weeks. These discharge instructions provide you with general information on caring for yourself after you leave the hospital. Your doctor may also give you specific instructions. While your treatment has been planned according to the most current medical practices available, unavoidable complications occasionally occur. If you have any problems or questions after discharge, call Dr. Rourk at [phone]. °ACTIVITY °· You may resume your regular activity, but move at a slower pace for the next 24 hours.  °· Take frequent rest periods for the next 24 hours.  °· Walking will help get rid of the air and reduce the bloated feeling in your belly (abdomen).  °· No driving for 24 hours (because of the medicine (anesthesia) used during the test).   °· Do not sign any important legal documents or operate any machinery for 24 hours (because of the anesthesia used during the test).  °NUTRITION °· Drink plenty of fluids.  °· You may resume your normal diet as instructed by your doctor.  °· Begin with a light meal and progress to your normal diet. Heavy or fried foods are harder to digest and may make you feel sick to your stomach (nauseated).  °· Avoid alcoholic beverages for 24 hours or as instructed.  °MEDICATIONS °· You may resume your normal medications unless your doctor tells you otherwise.  °WHAT YOU CAN EXPECT TODAY °· Some feelings of bloating in the abdomen.  °· Passage of more gas than usual.  °· Spotting of blood in your stool or on the toilet paper.  °IF YOU HAD POLYPS REMOVED DURING THE COLONOSCOPY: °· No aspirin products for 7 days or as instructed.  °· No alcohol for 7 days or as instructed.  °· Eat a soft diet for the next 24 hours.  °FINDING OUT THE RESULTS OF YOUR TEST °Not all test results are available during your visit. If your test results are not back during the visit, make an appointment  with your caregiver to find out the results. Do not assume everything is normal if you have not heard from your caregiver or the medical facility. It is important for you to follow up on all of your test results.  °SEEK IMMEDIATE MEDICAL ATTENTION IF: °· You have more than a spotting of blood in your stool.  °· Your belly is swollen (abdominal distention).  °· You are nauseated or vomiting.  °· You have a temperature over 101.  °· You have abdominal pain or discomfort that is severe or gets worse throughout the day.  °EGD °Discharge instructions °Please read the instructions outlined below and refer to this sheet in the next few weeks. These discharge instructions provide you with general information on caring for yourself after you leave the hospital. Your doctor may also give you specific instructions. While your treatment has been planned according to the most current medical practices available, unavoidable complications occasionally occur. If you have any problems or questions after discharge, please call your doctor. °ACTIVITY °· You may resume your regular activity but move at a slower pace for the next 24 hours.  °· Take frequent rest periods for the next 24 hours.  °· Walking will help expel (get rid of) the air and reduce the bloated feeling in your abdomen.  °· No driving for 24 hours (because of the anesthesia (medicine) used during the test).  °· You may shower.  °· Do not sign any important   legal documents or operate any machinery for 24 hours (because of the anesthesia used during the test).  NUTRITION  Drink plenty of fluids.   You may resume your normal diet.   Begin with a light meal and progress to your normal diet.   Avoid alcoholic beverages for 24 hours or as instructed by your caregiver.  MEDICATIONS  You may resume your normal medications unless your caregiver tells you otherwise.  WHAT YOU CAN EXPECT TODAY  You may experience abdominal discomfort such as a feeling of fullness  or gas pains.  FOLLOW-UP  Your doctor will discuss the results of your test with you.  SEEK IMMEDIATE MEDICAL ATTENTION IF ANY OF THE FOLLOWING OCCUR:  Excessive nausea (feeling sick to your stomach) and/or vomiting.   Severe abdominal pain and distention (swelling).   Trouble swallowing.   Temperature over 101 F (37.8 C).   Rectal bleeding or vomiting of blood.   Diverticulosis and colon polyp information provided  Further recommendations to follow pending review of pathology report  Office visit with [place] in 8 weeks.  Colon Polyps Introduction Polyps are tissue growths inside the body. Polyps can grow in many places, including the large intestine (colon). A polyp may be a round bump or a mushroom-shaped growth. You could have one polyp or several. Most colon polyps are noncancerous (benign). However, some colon polyps can become cancerous over time. What are the causes? The exact cause of colon polyps is not known. What increases the risk? This condition is more likely to develop in people who:  Have a family history of colon cancer or colon polyps.  Are older than 67 or older than 45 if they are African American.  Have inflammatory bowel disease, such as ulcerative colitis or Crohn disease.  Are overweight.  Smoke cigarettes.  Do not get enough exercise.  Drink too much alcohol.  Eat a diet that is:  High in fat and red meat.  Low in fiber.  Had childhood cancer that was treated with abdominal radiation. What are the signs or symptoms? Most polyps do not cause symptoms. If you have symptoms, they may include:  Blood coming from your rectum when having a bowel movement.  Blood in your stool.The stool may look dark red or black.  A change in bowel habits, such as constipation or diarrhea. How is this diagnosed? This condition is diagnosed with a colonoscopy. This is a procedure that uses a lighted, flexible scope to look at the inside of your  colon. How is this treated? Treatment for this condition involves removing any polyps that are found. Those polyps will then be tested for cancer. If cancer is found, your health care provider will talk to you about options for colon cancer treatment. Follow these instructions at home: Diet  Eat plenty of fiber, such as fruits, vegetables, and whole grains.  Eat foods that are high in calcium and vitamin D, such as milk, cheese, yogurt, eggs, liver, fish, and broccoli.  Limit foods high in fat, red meats, and processed meats, such as hot dogs, sausage, bacon, and lunch meats.  Maintain a healthy weight, or lose weight if recommended by your health care provider. General instructions  Do not smoke cigarettes.  Do not drink alcohol excessively.  Keep all follow-up visits as told by your health care provider. This is important. This includes keeping regularly scheduled colonoscopies. Talk to your health care provider about when you need a colonoscopy.  Exercise every day or as told by  your health care provider. Contact a health care provider if:  You have new or worsening bleeding during a bowel movement.  You have new or increased blood in your stool.  You have a change in bowel habits.  You unexpectedly lose weight. This information is not intended to replace advice given to you by your health care provider. Make sure you discuss any questions you have with your health care provider. Document Released: 10/26/2003 Document Revised: 07/07/2015 Document Reviewed: 12/20/2014  2017 Elsevier   Diverticulosis Diverticulosis is the condition that develops when small pouches (diverticula) form in the wall of your colon. Your colon, or large intestine, is where water is absorbed and stool is formed. The pouches form when the inside layer of your colon pushes through weak spots in the outer layers of your colon. CAUSES  No one knows exactly what causes diverticulosis. RISK  FACTORS  Being older than 22. Your risk for this condition increases with age. Diverticulosis is rare in people younger than 40 years. By age 26, almost everyone has it.  Eating a low-fiber diet.  Being frequently constipated.  Being overweight.  Not getting enough exercise.  Smoking.  Taking over-the-counter pain medicines, like aspirin and ibuprofen. SYMPTOMS  Most people with diverticulosis do not have symptoms. DIAGNOSIS  Because diverticulosis often has no symptoms, health care providers often discover the condition during an exam for other colon problems. In many cases, a health care provider will diagnose diverticulosis while using a flexible scope to examine the colon (colonoscopy). TREATMENT  If you have never developed an infection related to diverticulosis, you may not need treatment. If you have had an infection before, treatment may include:  Eating more fruits, vegetables, and grains.  Taking a fiber supplement.  Taking a live bacteria supplement (probiotic).  Taking medicine to relax your colon. HOME CARE INSTRUCTIONS   Drink at least 6-8 glasses of water each day to prevent constipation.  Try not to strain when you have a bowel movement.  Keep all follow-up appointments. If you have had an infection before:  Increase the fiber in your diet as directed by your health care provider or dietitian.  Take a dietary fiber supplement if your health care provider approves.  Only take medicines as directed by your health care provider. SEEK MEDICAL CARE IF:   You have abdominal pain.  You have bloating.  You have cramps.  You have not gone to the bathroom in 3 days. SEEK IMMEDIATE MEDICAL CARE IF:   Your pain gets worse.  Yourbloating becomes very bad.  You have a fever or chills, and your symptoms suddenly get worse.  You begin vomiting.  You have bowel movements that are bloody or black. MAKE SURE YOU:  Understand these instructions.  Will  watch your condition.  Will get help right away if you are not doing well or get worse. This information is not intended to replace advice given to you by your health care provider. Make sure you discuss any questions you have with your health care provider. Document Released: 10/27/2003 Document Revised: 02/03/2013 Document Reviewed: 12/24/2012 Elsevier Interactive Patient Education  2017 [employer].

## 2016-02-17 ENCOUNTER — Encounter (HOSPITAL_COMMUNITY): Payer: Self-pay | Admitting: Internal Medicine

## 2016-02-19 ENCOUNTER — Encounter: Payer: Self-pay | Admitting: Internal Medicine

## 2016-02-20 ENCOUNTER — Encounter: Payer: Self-pay | Admitting: Cardiology

## 2016-02-20 ENCOUNTER — Ambulatory Visit (INDEPENDENT_AMBULATORY_CARE_PROVIDER_SITE_OTHER): Payer: Medicare Other | Admitting: Cardiology

## 2016-02-20 VITALS — BP 138/88 | HR 78 | Ht 73.0 in | Wt 254.2 lb

## 2016-02-20 DIAGNOSIS — Z951 Presence of aortocoronary bypass graft: Secondary | ICD-10-CM

## 2016-02-20 DIAGNOSIS — I2589 Other forms of chronic ischemic heart disease: Secondary | ICD-10-CM

## 2016-02-20 DIAGNOSIS — I5022 Chronic systolic (congestive) heart failure: Secondary | ICD-10-CM | POA: Diagnosis not present

## 2016-02-20 DIAGNOSIS — N183 Chronic kidney disease, stage 3 unspecified: Secondary | ICD-10-CM

## 2016-02-20 DIAGNOSIS — I255 Ischemic cardiomyopathy: Secondary | ICD-10-CM

## 2016-02-20 DIAGNOSIS — I110 Hypertensive heart disease with heart failure: Secondary | ICD-10-CM

## 2016-02-20 NOTE — Progress Notes (Signed)
Cardiology Office Note   Date:  02/20/2016   ID:  Henry Carroll, DOB [birth year], MRN [id number]  PCP:  Henry Cranker, DO  Cardiologist:   Henry Furbish, MD       History of Present Illness: Henry Carroll is a 66 y.o. male who presents for follow-up, CAD status post 02/18/15 CABG 4, LIMA to LAD, free right internal mammary graft to ramus, SVG to OM, SVG to PDA. 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 Mild to moderate MR. Prior nuclear stress test was intermediate risk showing a medium-sized severe basal to mid inferior inferolateral perfusion defect. EF on nuclear stress was 30%. His cardiac catheterization demonstrated triple-vessel CAD.  Hg 6.5---scopes normal--improved.   In the past,  I noticed a cough. He has had asthma in the past. When he was off of his benazepril, he still had his cough.   He still experiencing some class II symptoms, moderate shortness of breath with activity. He did miss Lasix yesterday. Understands fluid restrictions. No orthopnea. No chest pain.    Past Medical History:  Diagnosis Date  . Anemia   . 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 Asthma   . Chronic combined systolic (congestive) and diastolic (congestive) heart failure    a. 12/31/14: 2D ECHO: EF 40-45%, HK of inf myocardium, G1DD, mod MR  . CKD (chronic kidney disease), stage III    stage 3 kidney disease  . Coronary artery disease    a. LHC 01/2015 - triple vessel CAD (mod oLM, mLAD, severe mRCA, intermediate branch stenosis, CTO of mCx). Plan CABG 02/2015.  Marland Kitchen GERD (gastroesophageal reflux disease)   . Hyperlipidemia   . Hypertension   . Mitral regurgitation    a. Mild-mod by echo 12/2014.  Marland Kitchen Myocardial infarction    pt. states per [name] he has in the past  . NSVT (nonsustained ventricular tachycardia) ([place])    a. 9 beats during 01/2015 adm. BB titrated.  . Obesity     a. BMI 33  . Type II diabetes mellitus ([place])     Past Surgical History:  Procedure Laterality Date  . BACK SURGERY    . CARDIAC CATHETERIZATION N/A 02/09/2015   Procedure: Left Heart Cath and Coronary Angiography;  Surgeon: Burnell Blanks, MD;  Location: [facility];  Service: Cardiovascular;  Laterality: N/A;  . COLONOSCOPY  2011   Dr. [name]: Ileocecal valve appeared normal, scattered pancolonic diverticulosis, difficult bowel prep making smaller lesions potentially missed. Recommended three-year follow-up colonoscopy.  . COLONOSCOPY  2003   Dr. [name]: Suspicious lesion at the ileocecal valve, multiple biopsies benign, pancolonic diverticulosis.  . COLONOSCOPY N/A 02/15/2016   Procedure: COLONOSCOPY;  Surgeon: Daneil Dolin, MD;  [facility];  Service: Endoscopy;  Laterality: N/A;  12:45 pm  . CORONARY ARTERY BYPASS GRAFT N/A 02/18/2015   Procedure: CORONARY ARTERY BYPASS GRAFTING (CABG) x  four, using bilateral internal mammary arteries and right leg greater saphenous vein harvested endoscopically;  Surgeon: [name], MD;  Location: [facility];  Service: Open Heart Surgery;  Laterality: N/A;  . ESOPHAGOGASTRODUODENOSCOPY N/A 02/15/2016   Procedure: ESOPHAGOGASTRODUODENOSCOPY (EGD);  Surgeon: Daneil Dolin, MD;  [facility];  Service: Endoscopy;  Laterality: N/A;  . HERNIA REPAIR  [id number]   Umbilical  . LUMBAR [place] SURGERY  2006   L4 & L5  . TEE WITHOUT CARDIOVERSION N/A  02/18/2015   Procedure: TRANSESOPHAGEAL ECHOCARDIOGRAM (TEE);  Surgeon: [name], MD;  Location: [facility];  Service: Open Heart Surgery;  Laterality: N/A;  . TONSILLECTOMY  1962     Current Outpatient Prescriptions  Medication Sig Dispense Refill  . acetaminophen (TYLENOL) 500 MG tablet Take 1,000 mg by mouth 2 (two) times daily as needed for moderate pain.    Marland Kitchen albuterol (PROVENTIL) (2.5 MG/3ML) 0.083% nebulizer solution USE ONE VIAL (3 ML) IN NEBULIZER EVERY 6 HOURS AS NEEDED  FOR WHEEZING OR SHORTNESS OF BREATH. Dx:J45.21, R06.2 300 mL 11  . amLODipine (NORVASC) 10 MG tablet Take 0.5 tablets (5 mg total) by mouth daily. 30 tablet 2  . aspirin EC 81 MG tablet Take 1 tablet (81 mg total) by mouth daily. 90 tablet 3  . atorvastatin (LIPITOR) 40 MG tablet Take 1 tablet (40 mg total) by mouth every evening. 90 tablet 3  . benazepril (LOTENSIN) 40 MG tablet TAKE ONE TABLET BY MOUTH ONCE DAILY FOR BLOOD PRESSURE 30 tablet 5  . carvedilol (COREG) 12.5 MG tablet TAKE ONE TABLET BY MOUTH TWICE DAILY WITH A MEAL 60 tablet 8  . Cholecalciferol ([employer]S VITAMIN D3) 1000 UNITS capsule Take 2 capsules (2,000 Units total) by mouth daily. 60 capsule 3  . CINNAMON PO Take 1,000 mg by mouth 2 (two) times daily.     . Coenzyme Q10 (CO Q 10 PO) Take 200 mg by mouth 2 (two) times daily.     . fluticasone (FLONASE) 50 MCG/ACT nasal spray Place 2 sprays into both nostrils daily as needed for allergies or rhinitis (SEASONAL ALLERGIES).    . Fluticasone-Salmeterol (ADVAIR DISKUS) 100-50 MCG/DOSE AEPB Inhale 1 puff into the lungs 2 (two) times daily. 60 each 3  . furosemide (LASIX) 40 MG tablet Take 1 tablet (40 mg total) by mouth 2 (two) times daily. 60 tablet 6  . hydrALAZINE (APRESOLINE) 25 MG tablet Take 1 tablet (25 mg total) by mouth 2 (two) times daily. 180 tablet 3  . insulin aspart (NOVOLOG FLEXPEN) 100 UNIT/ML FlexPen INJECT 5 UNITS SUBCUTANEOUSLYIF BLOOD SUGAR IS ABOVE 250, 10 UNITS IF ABOVE 300. 15 pen 3  . insulin detemir (LEVEMIR) 100 UNIT/ML injection Inject 0.6 mLs (60 Units total) into the skin 2 (two) times daily. 10 mL 3  . montelukast (SINGULAIR) 10 MG tablet Take 1 tablet (10 mg total) by mouth at bedtime. 30 tablet 6  . Multiple Vitamins-Minerals (ONE-A-DAY MENS 50+ ADVANTAGE) TABS Take 1 tablet by mouth daily.    . ONE TOUCH ULTRA TEST test strip USE ONE STRIP TO CHECK GLUCOSE THREE TIMES DAILY 100 each 11  . pantoprazole (PROTONIX) 40 MG tablet Take 1 tablet (40 mg total) by  mouth daily. 30 tablet 3  . polyethylene glycol-electrolytes (TRILYTE) 420 g solution Take 4,000 mLs by mouth as directed. 4000 mL 0  . PROAIR HFA 108 (90 Base) MCG/ACT inhaler INHALE TWO PUFFS BY MOUTH EVERY 6 HOURS AS NEEDED FOR SHORTNESS OF BREATH 27 each 2  . terbinafine (LAMISIL) 1 % cream Apply 1 application topically daily as needed (foot).     . TRADJENTA 5 MG TABS tablet TAKE ONE TABLET BY MOUTH ONCE DAILY 30 tablet 6   No current facility-administered medications for this visit.     Allergies:   Patient has no known allergies.    Social History:  The patient  reports that he quit smoking about 13 months ago. His smoking use included Cigars. He quit after 17.00 years of use.  He has never used smokeless tobacco. He reports that he drinks alcohol. He reports that he does not use drugs.   Family History:  The patient's family history includes Diabetes in his mother; Heart disease in his father and mother; Hypertension in his father; Kidney disease in his daughter; Stroke in his sister; Sudden death in his daughter.    ROS:  Please see the history of present illness.   Otherwise, review of systems are positive for none.   All other systems are reviewed and negative.    PHYSICAL EXAM: VS:  BP 138/88   Pulse 78   Ht 6\' 1"  (1.854 m)   Wt 254 lb 3.2 oz (115.3 kg)   BMI 33.54 kg/m  , BMI Body mass index is 33.54 kg/m. GEN: Well nourished, well developed, in no acute distress  HEENT: normal  Neck: no JVD, carotid bruits, or masses Cardiac: RRR; no murmurs, rubs, or gallops,no edema chest scar well-healed Respiratory:  clear to auscultation bilaterally, normal work of breathing GI: soft, nontender, nondistended, + BS MS: no deformity or atrophy  Skin: warm and dry, no rash, 2+ bilateral lower extremity edema, TED Neuro:  Strength and sensation are intact Psych: euthymic mood, full affect   EKG:  None today   Recent Labs: 12/19/2015: ALT 19; TSH 0.94 01/09/2016: Brain  Natriuretic Peptide 199.2 02/10/2016: BUN 27; Creatinine, Ser 1.76; Hemoglobin 10.0; Platelets 256; Potassium 4.5; Sodium 133    Lipid Panel    Component Value Date/Time   CHOL 153 12/19/2015 1448   CHOL 149 06/01/2015 1500   TRIG 128 12/19/2015 1448   HDL 49 12/19/2015 1448   HDL 45 06/01/2015 1500   CHOLHDL 3.1 12/19/2015 1448   VLDL 26 12/19/2015 1448   LDLCALC 78 12/19/2015 1448   LDLCALC 84 06/01/2015 1500      Wt Readings from Last 3 Encounters:  02/20/16 254 lb 3.2 oz (115.3 kg)  02/15/16 240 lb (108.9 kg)  02/10/16 245 lb 12.8 oz (111.5 kg)      Other studies Reviewed: Additional studies/ records that were reviewed today include: Hospital records, lab work, catheterization.  Limited Echo Study Conclusions from 12/2015  - Left ventricle: The cavity size was mildly dilated. Wall thickness was increased in a pattern of mild LVH. Systolic function was moderately reduced. The estimated ejection fraction was in the range of 35% to 40%. Diffuse hypokinesis. There is akinesis of the mid-apicalinferolateral myocardium. Features are consistent with a pseudonormal left ventricular filling pattern, with concomitant abnormal relaxation and increased filling pressure (grade 2 diastolic dysfunction). - Aortic valve: Trileaflet; mildly thickened, mildly calcified leaflets. - Aorta: Ascending aortic diameter: 40 mm (S). - Ascending aorta: The ascending aorta was mildly dilated. - Mitral valve: There was mild regurgitation. - Left atrium: The atrium was mildly dilated. - Tricuspid valve: There was moderate regurgitation. - Pulmonary arteries: Systolic pressure was mildly increased. PA peak pressure: 42 mm Hg (S).  Review of the above records demonstrates: As above   ASSESSMENT AND PLAN:  Coronary artery disease status post CABG 02/18/15  - Doing well postoperatively, feeling well, ambulating well. No real complaints today.  - [name] visit reviewed,  he has released him.   - Aspirin 81 currently.  Chronic systolic heart failure  - ejection fraction 35-40% echocardiogram  - Continue with carvedilol 12.5 twice a day, Benazepril  - Continuing hydralazine.   - Furosemide-expressed the importance of continuing with close monitoring of fluid weight.  - Doing much better. NYHA class  2  - Could consider Entresto in the future  Hypertensive heart disease with heart failure  - Blood pressure medications reviewed as above.  - Stable.  Hyperlipidemia  -Continue with atorvastatin 40 mg  - I would like to see LDL less than 70.    Diabetes with vascular complications  - CAD  - Per primary team. He states he is having some side effects on his new diabetic medication. He will discuss.  CKD 3  - Avoid Aleve. Discussed.  - Note reviewed from 01/27/15 from [place] [facility] prior creatinine 1.84, Dr. Mercy Moore. He restarted his benazepril which is excellent in the setting of his cardiomyopathy. He had a cough while he was off of his benazepril. Not ACE inhibitor cough.  Anemia  - Endoscopies normal.   - Hg 6.5 at one point   Current medicines are reviewed at length with the patient today.  The patient does not have concerns regarding medicines.  The following changes have been made:  no change  Labs/ tests ordered today include: none  No orders of the defined types were placed in this encounter.    Disposition:   FU with Cecille Rubin in 6 months 12 months with me  Signed, Henry Furbish, MD  02/20/2016 9:09 AM    [address], [place], [place]  [address] Phone: [phone]; Fax: [phone]

## 2016-02-20 NOTE — Patient Instructions (Signed)
Medication Instructions:  The current medical regimen is effective;  continue present plan and medications.  Follow-Up: Follow up in 6 months with Lori Gerhardt, NP.  You will receive a letter in the mail 2 months before you are due.  Please call us when you receive this letter to schedule your follow up appointment.  Follow up in 1 year with Dr. Skains.  You will receive a letter in the mail 2 months before you are due.  Please call us when you receive this letter to schedule your follow up appointment.  If you need a refill on your cardiac medications before your next appointment, please call your pharmacy.  Thank you for choosing [facility]!!     

## 2016-03-09 ENCOUNTER — Encounter: Payer: Self-pay | Admitting: Internal Medicine

## 2016-03-09 ENCOUNTER — Ambulatory Visit (INDEPENDENT_AMBULATORY_CARE_PROVIDER_SITE_OTHER): Payer: Medicare Other | Admitting: Internal Medicine

## 2016-03-09 VITALS — BP 144/80 | HR 72 | Temp 97.3°F | Ht 73.0 in | Wt 250.8 lb

## 2016-03-09 DIAGNOSIS — Z951 Presence of aortocoronary bypass graft: Secondary | ICD-10-CM | POA: Diagnosis not present

## 2016-03-09 DIAGNOSIS — I255 Ischemic cardiomyopathy: Secondary | ICD-10-CM | POA: Diagnosis not present

## 2016-03-09 DIAGNOSIS — IMO0002 Reserved for concepts with insufficient information to code with codable children: Secondary | ICD-10-CM

## 2016-03-09 DIAGNOSIS — I10 Essential (primary) hypertension: Secondary | ICD-10-CM

## 2016-03-09 DIAGNOSIS — J4520 Mild intermittent asthma, uncomplicated: Secondary | ICD-10-CM | POA: Diagnosis not present

## 2016-03-09 DIAGNOSIS — R600 Localized edema: Secondary | ICD-10-CM | POA: Diagnosis not present

## 2016-03-09 DIAGNOSIS — E1165 Type 2 diabetes mellitus with hyperglycemia: Secondary | ICD-10-CM | POA: Diagnosis not present

## 2016-03-09 DIAGNOSIS — D631 Anemia in chronic kidney disease: Secondary | ICD-10-CM | POA: Diagnosis not present

## 2016-03-09 DIAGNOSIS — I5022 Chronic systolic (congestive) heart failure: Secondary | ICD-10-CM

## 2016-03-09 DIAGNOSIS — N183 Chronic kidney disease, stage 3 unspecified: Secondary | ICD-10-CM

## 2016-03-09 DIAGNOSIS — E1121 Type 2 diabetes mellitus with diabetic nephropathy: Secondary | ICD-10-CM | POA: Diagnosis not present

## 2016-03-09 DIAGNOSIS — E785 Hyperlipidemia, unspecified: Secondary | ICD-10-CM

## 2016-03-09 MED ORDER — INSULIN DETEMIR 100 UNIT/ML ~~LOC~~ SOLN: 54.0000 [IU] | Freq: Two times a day (BID) | SUBCUTANEOUS | 6 refills | Status: DC

## 2016-03-09 NOTE — Progress Notes (Signed)
Patient ID: Henry Carroll, male   DOB: 11/29/1950, 66 y.o.   MRN: [id number]    Location:  [facility] Place of Service: OFFICE  Chief Complaint  Patient presents with  . Medical Management of Chronic Issues    2 months routine visit  . Medication Management    tradjenta having side effects, rash, itching    HPI:  66 yo male seen today for f/u. He was admitted to the hospital for CHF exacerbation, anemia of chronic disease in Nov 2017. In early Jan 2018 he underwent EGD which was nml and Colonoscopy showed tubular adenomas but no high grade dysplasia; both performed by Dr [name]. He rec'd blood transfusion and IV iron during Nov admission.   He reports several mos hx skin spots on legs that were nonhealing. He also noted increased itching that worsened over the last several weeks. He states skin spots began in Feb/Mar 2017 after CABG. He stopped medication 2 weeks ago and sx's are improving.  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 Vein harvested from right leg. ABIs 0.62 on right and 0.28 on left. Cr 1.8 and Hgb 8.9 at d/c. BP meds adjusted. Followed by cardio Dr Marlou Porch. Released from CT surgery. Completed cardiac rehab in July 2017.  Chronic systolic CHF - weight 400.8QPY today (down 3 lbs in last few weeks). Followed by cardio Dr Marlou Porch. 2D echo revealed EF 35-40% with diffuse hypokinesis and grade 2 DD; mild MR/mild dilated LA; mod TR. He is NYHA class 2. Cardio considering Entresto  DM - BS at home 90s fasting, at night 140-150s . No low BS reactions. He stopped using novolog prior to asthma attack. No numbness or tingling. Takes tradjenta and levemir. Uses 50 units levemir BID most days. A1c 7.4%; urine microalbumin/Cr ratio 1202  HTN - stable. No muscle cramps since last OV. Takes lasix, coreg, hydralazine, amlodipine. Takes ASA daily. Followed by cardio Dr Marlou Porch  Hyperlipidemia - stable on statin. No myalgias. LDL 78  Asthma - see above. He takes  advair and albuterol neb. Takes singulair  CKD - followed by nephrology Dr Mercy Moore. 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 He has been transfused with PRBCs and rec'd iron infusions. Ferritin 18; iron 40  His albumin was 3.2; total protein 5.7  Past Medical History:  Diagnosis Date  . Anemia   . 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 Asthma   . Chronic combined systolic (congestive) and diastolic (congestive) heart failure    a. 12/31/14: 2D ECHO: EF 40-45%, HK of inf myocardium, G1DD, mod MR  . CKD (chronic kidney disease), stage III    stage 3 kidney disease  . Coronary artery disease    a. LHC 01/2015 - triple vessel CAD (mod oLM, mLAD, severe mRCA, intermediate branch stenosis, CTO of mCx). Plan CABG 02/2015.  Marland Kitchen GERD (gastroesophageal reflux disease)   . Hyperlipidemia   . Hypertension   . Mitral regurgitation    a. Mild-mod by echo 12/2014.  Marland Kitchen Myocardial infarction    pt. states per [name] he has in the past  . NSVT (nonsustained ventricular tachycardia) ([place])    a. 9 beats during 01/2015 adm. BB titrated.  . Obesity    a. BMI 33  . Type II diabetes mellitus ([place])     Past Surgical History:  Procedure Laterality Date  . BACK SURGERY    . CARDIAC CATHETERIZATION N/A 02/09/2015   Procedure: Left Heart Cath  and Coronary Angiography;  Surgeon: Burnell Blanks, MD;  Location: [place] [facility];  Service: Cardiovascular;  Laterality: N/A;  . COLONOSCOPY  2011   Dr. [name]: Ileocecal valve appeared normal, scattered pancolonic diverticulosis, difficult bowel prep making smaller lesions potentially missed. Recommended three-year follow-up colonoscopy.  . COLONOSCOPY  2003   Dr. [name]: Suspicious lesion at the ileocecal valve, multiple biopsies benign, pancolonic diverticulosis.  . COLONOSCOPY N/A 02/15/2016   Procedure: COLONOSCOPY;  Surgeon: Daneil Dolin, MD;  Location[facility];  Service: Endoscopy;  Laterality: N/A;  12:45 pm  . CORONARY ARTERY BYPASS GRAFT N/A 02/18/2015    Procedure: CORONARY ARTERY BYPASS GRAFTING (CABG) x  four, using bilateral internal mammary arteries and right leg greater saphenous vein harvested endoscopically;  Surgeon: [name], MD;  Location: [place] [facility];  Service: Open Heart Surgery;  Laterality: N/A;  . ESOPHAGOGASTRODUODENOSCOPY N/A 02/15/2016   Procedure: ESOPHAGOGASTRODUODENOSCOPY (EGD);  Surgeon: Daneil Dolin, MD;  Location: [facility];  Service: Endoscopy;  Laterality: N/A;  . HERNIA REPAIR  [id number]   Umbilical  . LUMBAR Craig SURGERY  2006   L4 & L5  . TEE WITHOUT CARDIOVERSION N/A 02/18/2015   Procedure: TRANSESOPHAGEAL ECHOCARDIOGRAM (TEE);  Surgeon: [name], MD;  Location: [place];  Service: Open Heart Surgery;  Laterality: N/A;  . TONSILLECTOMY  1962    Patient Care Team: Gildardo Cranker, DO as PCP - General (Internal Medicine) Jerline Pain, MD as Consulting Physician (Cardiology) Fleet Contras, MD as Consulting Physician (Nephrology) Rutherford Guys, MD as Consulting Physician (Ophthalmology) Daneil Dolin, MD as Consulting Physician (Gastroenterology)  Social History   Social History  . Marital status: Married    Spouse name: N/A  . Number of children: N/A  . Years of education: N/A   Occupational History  . Not on file.   Social History Main Topics  . Smoking status: Former Smoker    Years: 17.00    Types: Cigars    Quit date: 12/31/2014  . Smokeless tobacco: Never Used  . Alcohol use 0.0 oz/week     Comment: occasional glass of wine.  . Drug use: No  . Sexual activity: Yes    Birth control/ protection: None   Other Topics Concern  . Not on file   Social History Narrative  . No narrative on file     reports that he quit smoking about 14 months ago. His smoking use included Cigars. He quit after 17.00 years of use. He has never used smokeless tobacco. He reports that he drinks alcohol. He reports that he does not use drugs.  Family History  Problem Relation Age of Onset  . Diabetes Mother     . Heart disease Mother     later in life, age >63  . Heart disease Father     heart failure later in life  . Hypertension Father   . Stroke Sister   . Kidney disease Daughter   . Sudden death Daughter   . Heart attack Neg Hx    Family Status  Relation Status  . Mother Deceased at age 78   Cause of Death: Complications of diabetes  . Father Deceased at age 9   Cause of Death: Heart disease  . Sister Deceased at age 19   Cause of Death: CVA  . Brother Alive  . Daughter Alive  . Sister Alive  . Maternal Grandmother Deceased  . Maternal Grandfather Deceased  . Paternal Grandmother Deceased  .  Paternal Grandfather Deceased  . Daughter   . Daughter   . Neg Hx      No Known Allergies  Medications: Patient's Medications  New Prescriptions   No medications on file  Previous Medications   ACETAMINOPHEN (TYLENOL) 500 MG TABLET    Take 1,000 mg by mouth 2 (two) times daily as needed for moderate pain.   ALBUTEROL (PROVENTIL) (2.5 MG/3ML) 0.083% NEBULIZER SOLUTION    USE ONE VIAL (3 ML) IN NEBULIZER EVERY 6 HOURS AS NEEDED FOR WHEEZING OR SHORTNESS OF BREATH. Dx:J45.21, R06.2   AMLODIPINE (NORVASC) 10 MG TABLET    Take 0.5 tablets (5 mg total) by mouth daily.   ASPIRIN EC 81 MG TABLET    Take 1 tablet (81 mg total) by mouth daily.   ATORVASTATIN (LIPITOR) 40 MG TABLET    Take 1 tablet (40 mg total) by mouth every evening.   BENAZEPRIL (LOTENSIN) 40 MG TABLET    TAKE ONE TABLET BY MOUTH ONCE DAILY FOR BLOOD PRESSURE   CARVEDILOL (COREG) 12.5 MG TABLET    TAKE ONE TABLET BY MOUTH TWICE DAILY WITH A MEAL   CHOLECALCIFEROL ([employer] VITAMIN D3) 1000 UNITS CAPSULE    Take 2 capsules (2,000 Units total) by mouth daily.   CINNAMON PO    Take 1,000 mg by mouth 2 (two) times daily.    COENZYME Q10 (CO Q 10 PO)    Take 200 mg by mouth 2 (two) times daily.    FLUTICASONE (FLONASE) 50 MCG/ACT NASAL SPRAY    Place 2 sprays into both nostrils daily as needed for allergies or rhinitis (SEASONAL  ALLERGIES).   FLUTICASONE-SALMETEROL (ADVAIR DISKUS) 100-50 MCG/DOSE AEPB    Inhale 1 puff into the lungs 2 (two) times daily.   FUROSEMIDE (LASIX) 40 MG TABLET    Take 1 tablet (40 mg total) by mouth 2 (two) times daily.   HYDRALAZINE (APRESOLINE) 25 MG TABLET    Take 1 tablet (25 mg total) by mouth 2 (two) times daily.   INSULIN ASPART (NOVOLOG FLEXPEN) 100 UNIT/ML FLEXPEN    INJECT 5 UNITS SUBCUTANEOUSLYIF BLOOD SUGAR IS ABOVE 250, 10 UNITS IF ABOVE 300.   MONTELUKAST (SINGULAIR) 10 MG TABLET    Take 1 tablet (10 mg total) by mouth at bedtime.   MULTIPLE VITAMINS-MINERALS (ONE-A-DAY MENS 50+ ADVANTAGE) TABS    Take 1 tablet by mouth daily.   ONE TOUCH ULTRA TEST TEST STRIP    USE ONE STRIP TO CHECK GLUCOSE THREE TIMES DAILY   PANTOPRAZOLE (PROTONIX) 40 MG TABLET    Take 1 tablet (40 mg total) by mouth daily.   POLYETHYLENE GLYCOL-ELECTROLYTES (TRILYTE) 420 G SOLUTION    Take 4,000 mLs by mouth as directed.   PROAIR HFA 108 (90 BASE) MCG/ACT INHALER    INHALE TWO PUFFS BY MOUTH EVERY 6 HOURS AS NEEDED FOR SHORTNESS OF BREATH   TERBINAFINE (LAMISIL) 1 % CREAM    Apply 1 application topically daily as needed (foot).   Modified Medications   Modified Medication Previous Medication   INSULIN DETEMIR (LEVEMIR) 100 UNIT/ML INJECTION insulin detemir (LEVEMIR) 100 UNIT/ML injection      Inject 0.54 mLs (54 Units total) into the skin 2 (two) times daily.    Inject 0.6 mLs (60 Units total) into the skin 2 (two) times daily.  Discontinued Medications   TRADJENTA 5 MG TABS TABLET    TAKE ONE TABLET BY MOUTH ONCE DAILY    Review of Systems  Skin: Positive for rash.  All  other systems reviewed and are negative.   Vitals:   03/09/16 0822  BP: (!) 144/80  Pulse: 72  Temp: 97.3 F (36.3 C)  TempSrc: Oral  SpO2: 97%  Weight: 250 lb 12.8 oz (113.8 kg)  Height: _0  (1.854 m)   Body mass index is 33.09 kg/m.  Physical Exam  Constitutional: He is oriented to person, place, and time. He appears  well-developed and well-nourished.  HENT:  Mouth/Throat: Oropharynx is clear and moist.  Eyes: Pupils are equal, round, and reactive to light. No scleral icterus.  Neck: Neck supple. Carotid bruit is not present. No thyromegaly present.  Cardiovascular: Normal rate, regular rhythm and intact distal pulses.  Exam reveals no gallop and no friction rub.   Murmur (1/6 SEM) heard. +1 pitting LE edema b/l. B/l hand swelling. No calf TTP  Pulmonary/Chest: Effort normal. He has no wheezes. He has no rales. He exhibits no tenderness.  Abdominal: Soft. Bowel sounds are normal. He exhibits no distension, no abdominal bruit, no pulsatile midline mass and no mass. There is no tenderness. There is no rebound and no guarding.  Musculoskeletal: He exhibits edema.  Lymphadenopathy:    He has no cervical adenopathy.  Neurological: He is alert and oriented to person, place, and time. He has normal reflexes.  Skin: Skin is warm and dry. No rash noted.  Psychiatric: He has a normal mood and affect. His behavior is normal. Judgment and thought content normal.     Labs reviewed: Admission on 02/15/2016, Discharged on 02/15/2016  Component Date Value Ref Range Status  . Glucose-Capillary 02/15/2016 120* 65 - 99 mg/dL Final  Hospital Outpatient Visit on 02/10/2016  Component Date Value Ref Range Status  . WBC 02/10/2016 3.7* 4.0 - 10.5 K/uL Final  . RBC 02/10/2016 3.14* 4.22 - 5.81 MIL/uL Final  . Hemoglobin 02/10/2016 10.0* 13.0 - 17.0 g/dL Final  . HCT 02/10/2016 28.6* 39.0 - 52.0 % Final  . MCV 02/10/2016 91.1  78.0 - 100.0 fL Final  . MCH 02/10/2016 31.8  26.0 - 34.0 pg Final  . MCHC 02/10/2016 35.0  30.0 - 36.0 g/dL Final  . RDW 02/10/2016 13.9  11.5 - 15.5 % Final  . Platelets 02/10/2016 256  150 - 400 K/uL Final  . Neutrophils Relative % 02/10/2016 56  % Final  . Neutro Abs 02/10/2016 2.1  1.7 - 7.7 K/uL Final  . Lymphocytes Relative 02/10/2016 32  % Final  . Lymphs Abs 02/10/2016 1.2  0.7 - 4.0  K/uL Final  . Monocytes Relative 02/10/2016 8  % Final  . Monocytes Absolute 02/10/2016 0.3  0.1 - 1.0 K/uL Final  . Eosinophils Relative 02/10/2016 3  % Final  . Eosinophils Absolute 02/10/2016 0.1  0.0 - 0.7 K/uL Final  . Basophils Relative 02/10/2016 1  % Final  . Basophils Absolute 02/10/2016 0.0  0.0 - 0.1 K/uL Final  . Sodium 02/10/2016 133* 135 - 145 mmol/L Final  . Potassium 02/10/2016 4.5  3.5 - 5.1 mmol/L Final  . Chloride 02/10/2016 99* 101 - 111 mmol/L Final  . CO2 02/10/2016 29  22 - 32 mmol/L Final  . Glucose, Bld 02/10/2016 182* 65 - 99 mg/dL Final  . BUN 02/10/2016 27* 6 - 20 mg/dL Final  . Creatinine, Ser 02/10/2016 1.76* 0.61 - 1.24 mg/dL Final  . Calcium 02/10/2016 8.7* 8.9 - 10.3 mg/dL Final  . GFR calc non Af Amer 02/10/2016 38* >60 mL/min Final  . GFR calc Af Amer 02/10/2016 44* >60 mL/min  Final   Comment: (NOTE) The eGFR has been calculated using the CKD EPI equation. This calculation has not been validated in all clinical situations. eGFR's persistently <60 mL/min signify possible Chronic Kidney Disease.   . Anion gap 02/10/2016 5  5 - 15 Final  Office Visit on 01/24/2016  Component Date Value Ref Range Status  . Sodium 01/24/2016 140  135 - 146 mmol/L Final  . Potassium 01/24/2016 4.4  3.5 - 5.3 mmol/L Final  . Chloride 01/24/2016 106  98 - 110 mmol/L Final  . CO2 01/24/2016 25  20 - 31 mmol/L Final  . Glucose, Bld 01/24/2016 151* 65 - 99 mg/dL Final  . BUN 01/24/2016 38* 7 - 25 mg/dL Final  . Creat 01/24/2016 1.75* 0.70 - 1.25 mg/dL Final   Comment:   For patients > or = 68 years of age: The upper reference limit for Creatinine is approximately 13% higher for people identified as African-American.     . Calcium 01/24/2016 9.0  8.6 - 10.3 mg/dL Final  . WBC 01/24/2016 5.9  3.8 - 10.8 K/uL Final  . RBC 01/24/2016 3.03* 4.20 - 5.80 MIL/uL Final  . Hemoglobin 01/24/2016 8.9* 13.2 - 17.1 g/dL Final  . HCT 01/24/2016 27.4* 38.5 - 50.0 % Final  . MCV  01/24/2016 90.4  80.0 - 100.0 fL Final  . MCH 01/24/2016 29.4  27.0 - 33.0 pg Final  . MCHC 01/24/2016 32.5  32.0 - 36.0 g/dL Final  . RDW 01/24/2016 14.7  11.0 - 15.0 % Final  . Platelets 01/24/2016 293  140 - 400 K/uL Final  . MPV 01/24/2016 8.8  7.5 - 12.5 fL Final  Lab on 01/16/2016  Component Date Value Ref Range Status  . Sodium 01/16/2016 141  135 - 146 mmol/L Final  . Potassium 01/16/2016 4.8  3.5 - 5.3 mmol/L Final  . Chloride 01/16/2016 109  98 - 110 mmol/L Final  . CO2 01/16/2016 26  20 - 31 mmol/L Final  . Glucose, Bld 01/16/2016 57* 65 - 99 mg/dL Final  . BUN 01/16/2016 56* 7 - 25 mg/dL Final  . Creat 01/16/2016 1.92* 0.70 - 1.25 mg/dL Final   Comment:   For patients > or = 68 years of age: The upper reference limit for Creatinine is approximately 13% higher for people identified as African-American.     . Calcium 01/16/2016 9.3  8.6 - 10.3 mg/dL Final  . WBC 01/16/2016 5.0  3.8 - 10.8 K/uL Final  . RBC 01/16/2016 2.95* 4.20 - 5.80 MIL/uL Final  . Hemoglobin 01/16/2016 8.7* 13.2 - 17.1 g/dL Final  . HCT 01/16/2016 26.6* 38.5 - 50.0 % Final  . MCV 01/16/2016 90.2  80.0 - 100.0 fL Final  . MCH 01/16/2016 29.5  27.0 - 33.0 pg Final  . MCHC 01/16/2016 32.7  32.0 - 36.0 g/dL Final  . RDW 01/16/2016 14.2  11.0 - 15.0 % Final  . Platelets 01/16/2016 351  140 - 400 K/uL Final  . MPV 01/16/2016 8.7  7.5 - 12.5 fL Final  . Neutro Abs 01/16/2016 3050  1,500 - 7,800 cells/uL Final  . Lymphs Abs 01/16/2016 1350  850 - 3,900 cells/uL Final  . Monocytes Absolute 01/16/2016 500  200 - 950 cells/uL Final  . Eosinophils Absolute 01/16/2016 100  15 - 500 cells/uL Final  . Basophils Absolute 01/16/2016 0  0 - 200 cells/uL Final  . Neutrophils Relative % 01/16/2016 61  % Final  . Lymphocytes Relative 01/16/2016 27  % Final  .  Monocytes Relative 01/16/2016 10  % Final  . Eosinophils Relative 01/16/2016 2  % Final  . Basophils Relative 01/16/2016 0  % Final  . Smear Review 01/16/2016  Criteria for review not met   Final  Office Visit on 01/09/2016  Component Date Value Ref Range Status  . Brain Natriuretic Peptide 01/09/2016 199.2* <100 pg/mL Final   Comment:   BNP levels increase with age in the general population with the highest values seen in individuals greater than 31 years of age. Reference: Joellyn Rued Cardiol 2002; 37:169-67.     . Sodium 01/09/2016 134* 135 - 146 mmol/L Final  . Potassium 01/09/2016 4.7  3.5 - 5.3 mmol/L Final  . Chloride 01/09/2016 99  98 - 110 mmol/L Final  . CO2 01/09/2016 26  20 - 31 mmol/L Final  . Glucose, Bld 01/09/2016 371* 65 - 99 mg/dL Final  . BUN 01/09/2016 60* 7 - 25 mg/dL Final  . Creat 01/09/2016 2.18* 0.70 - 1.25 mg/dL Final   Comment:   For patients > or = 68 years of age: The upper reference limit for Creatinine is approximately 13% higher for people identified as African-American.     . Calcium 01/09/2016 8.5* 8.6 - 10.3 mg/dL Final  . WBC 01/09/2016 5.2  3.8 - 10.8 K/uL Final  . RBC 01/09/2016 2.85* 4.20 - 5.80 MIL/uL Final  . Hemoglobin 01/09/2016 8.2* 13.2 - 17.1 g/dL Final  . HCT 01/09/2016 26.2* 38.5 - 50.0 % Final  . MCV 01/09/2016 91.9  80.0 - 100.0 fL Final  . MCH 01/09/2016 28.8  27.0 - 33.0 pg Final  . MCHC 01/09/2016 31.3* 32.0 - 36.0 g/dL Final  . RDW 01/09/2016 13.8  11.0 - 15.0 % Final  . Platelets 01/09/2016 299  140 - 400 K/uL Final  . MPV 01/09/2016 8.7  7.5 - 12.5 fL Final  . Iron 01/09/2016 40* 50 - 180 ug/dL Final  . UIBC 01/09/2016 298  125 - 400 ug/dL Final  . TIBC 01/09/2016 338  250 - 425 ug/dL Final  . %SAT 01/09/2016 12* 15 - 60 % Final  . Ferritin 01/09/2016 18* 20 - 380 ng/mL Final  Appointment on 01/03/2016  Component Date Value Ref Range Status  . Weight 01/03/2016 4118.4  oz Final  . Height 01/03/2016 73.000  in Final  . BP 01/03/2016 158/70  mmHg Final  Office Visit on 01/03/2016  Component Date Value Ref Range Status  . Sodium 01/03/2016 140  135 - 146 mmol/L Final  .  Potassium 01/03/2016 4.8  3.5 - 5.3 mmol/L Final  . Chloride 01/03/2016 107  98 - 110 mmol/L Final  . CO2 01/03/2016 26  20 - 31 mmol/L Final  . Glucose, Bld 01/03/2016 90  65 - 99 mg/dL Final  . BUN 01/03/2016 40* 7 - 25 mg/dL Final  . Creat 01/03/2016 2.02* 0.70 - 1.25 mg/dL Final   Comment:   For patients > or = 68 years of age: The upper reference limit for Creatinine is approximately 13% higher for people identified as African-American.     . Calcium 01/03/2016 8.4* 8.6 - 10.3 mg/dL Final  . WBC 01/03/2016 5.8  3.8 - 10.8 K/uL Final  . RBC 01/03/2016 2.72* 4.20 - 5.80 MIL/uL Final  . Hemoglobin 01/03/2016 8.0* 13.2 - 17.1 g/dL Final  . HCT 01/03/2016 25.1* 38.5 - 50.0 % Final  . MCV 01/03/2016 92.3  80.0 - 100.0 fL Final  . MCH 01/03/2016 29.4  27.0 - 33.0 pg  Final  . MCHC 01/03/2016 31.9* 32.0 - 36.0 g/dL Final  . RDW 01/03/2016 14.2  11.0 - 15.0 % Final  . Platelets 01/03/2016 285  140 - 400 K/uL Final  . MPV 01/03/2016 8.9  7.5 - 12.5 fL Final  . Brain Natriuretic Peptide 01/03/2016 646.1* <100 pg/mL Final   Comment:   BNP levels increase with age in the general population with the highest values seen in individuals greater than 42 years of age. Reference: Joellyn Rued Cardiol 2002; 24:235-36.     Admission on 12/29/2015, Discharged on 12/31/2015  Component Date Value Ref Range Status  . Sodium 12/29/2015 136  135 - 145 mmol/L Final  . Potassium 12/29/2015 4.7  3.5 - 5.1 mmol/L Final  . Chloride 12/29/2015 106  101 - 111 mmol/L Final  . CO2 12/29/2015 22  22 - 32 mmol/L Final  . Glucose, Bld 12/29/2015 235* 65 - 99 mg/dL Final  . BUN 12/29/2015 38* 6 - 20 mg/dL Final  . Creatinine, Ser 12/29/2015 2.14* 0.61 - 1.24 mg/dL Final  . Calcium 12/29/2015 8.7* 8.9 - 10.3 mg/dL Final  . GFR calc non Af Amer 12/29/2015 30* >60 mL/min Final  . GFR calc Af Amer 12/29/2015 35* >60 mL/min Final   Comment: (NOTE) The eGFR has been calculated using the CKD EPI equation. This  calculation has not been validated in all clinical situations. eGFR's persistently <60 mL/min signify possible Chronic Kidney Disease.   . Anion gap 12/29/2015 8  5 - 15 Final  . WBC 12/29/2015 5.8  4.0 - 10.5 K/uL Final  . RBC 12/29/2015 2.17* 4.22 - 5.81 MIL/uL Final  . Hemoglobin 12/29/2015 6.5* 13.0 - 17.0 g/dL Final   Comment: REPEATED TO VERIFY CRITICAL RESULT CALLED TO, READ BACK BY AND VERIFIED WITH: DOOLEY,H RN @ 1443 12/29/15 LEONARD,A   . HCT 12/29/2015 19.6* 39.0 - 52.0 % Final  . MCV 12/29/2015 90.3  78.0 - 100.0 fL Final  . MCH 12/29/2015 30.0  26.0 - 34.0 pg Final  . MCHC 12/29/2015 33.2  30.0 - 36.0 g/dL Final  . RDW 12/29/2015 13.2  11.5 - 15.5 % Final  . Platelets 12/29/2015 304  150 - 400 K/uL Final  . Troponin i, poc 12/29/2015 0.06  0.00 - 0.08 ng/mL Final  . Comment 3 12/29/2015          Final   Comment: Due to the release kinetics of cTnI, a negative result within the first hours of the onset of symptoms does not rule out myocardial infarction with certainty. If myocardial infarction is still suspected, repeat the test at appropriate intervals.   . B Natriuretic Peptide 12/29/2015 574.0* 0.0 - 100.0 pg/mL Final  . ABO/RH(D) 12/29/2015 A POS   Final  . Antibody Screen 12/29/2015 NEG   Final  . Sample Expiration 12/29/2015 01/01/2016   Final  . Unit Number 12/29/2015 [id number]   Final  . Blood Component Type 12/29/2015 RED CELLS,LR   Final  . Unit division 12/29/2015 00   Final  . Status of Unit 12/29/2015 ISSUED,FINAL   Final  . Transfusion Status 12/29/2015 OK TO TRANSFUSE   Final  . Crossmatch Result 12/29/2015 Compatible   Final  . Unit Number 12/29/2015 [id number]   Final  . Blood Component Type 12/29/2015 RED CELLS,LR   Final  . Unit division 12/29/2015 00   Final  . Status of Unit 12/29/2015 ISSUED,FINAL   Final  . Transfusion Status 12/29/2015 OK TO TRANSFUSE   Final  .  Crossmatch Result 12/29/2015 Compatible   Final  . Troponin I  12/29/2015 0.07* <0.03 ng/mL Final   Comment: CRITICAL RESULT CALLED TO, READ BACK BY AND VERIFIED WITH: J GLOSTER,RN 1930 12/29/15 D BRADLEY   . Troponin I 12/29/2015 0.07* <0.03 ng/mL Final  . Troponin I 12/30/2015 0.07* <0.03 ng/mL Final  . Sodium 12/30/2015 140  135 - 145 mmol/L Final  . Potassium 12/30/2015 4.6  3.5 - 5.1 mmol/L Final  . Chloride 12/30/2015 109  101 - 111 mmol/L Final  . CO2 12/30/2015 24  22 - 32 mmol/L Final  . Glucose, Bld 12/30/2015 133* 65 - 99 mg/dL Final  . BUN 12/30/2015 36* 6 - 20 mg/dL Final  . Creatinine, Ser 12/30/2015 1.79* 0.61 - 1.24 mg/dL Final  . Calcium 12/30/2015 8.7* 8.9 - 10.3 mg/dL Final  . GFR calc non Af Amer 12/30/2015 38* >60 mL/min Final  . GFR calc Af Amer 12/30/2015 43* >60 mL/min Final   Comment: (NOTE) The eGFR has been calculated using the CKD EPI equation. This calculation has not been validated in all clinical situations. eGFR's persistently <60 mL/min signify possible Chronic Kidney Disease.   . Anion gap 12/30/2015 7  5 - 15 Final  . WBC 12/29/2015 6.8  4.0 - 10.5 K/uL Final  . RBC 12/29/2015 2.11* 4.22 - 5.81 MIL/uL Final  . Hemoglobin 12/29/2015 6.3* 13.0 - 17.0 g/dL Final   Comment: REPEATED TO VERIFY CRITICAL VALUE NOTED.  VALUE IS CONSISTENT WITH PREVIOUSLY REPORTED AND CALLED VALUE.   Marland Kitchen HCT 12/29/2015 19.1* 39.0 - 52.0 % Final  . MCV 12/29/2015 90.5  78.0 - 100.0 fL Final  . MCH 12/29/2015 29.9  26.0 - 34.0 pg Final  . MCHC 12/29/2015 33.0  30.0 - 36.0 g/dL Final  . RDW 12/29/2015 13.7  11.5 - 15.5 % Final  . Platelets 12/29/2015 271  150 - 400 K/uL Final  . Creatinine, Ser 12/29/2015 1.99* 0.61 - 1.24 mg/dL Final  . GFR calc non Af Amer 12/29/2015 33* >60 mL/min Final  . GFR calc Af Amer 12/29/2015 38* >60 mL/min Final   Comment: (NOTE) The eGFR has been calculated using the CKD EPI equation. This calculation has not been validated in all clinical situations. eGFR's persistently <60 mL/min signify possible  Chronic Kidney Disease.   . Order Confirmation 12/30/2015 ORDER PROCESSED BY BLOOD BANK   Final  . WBC 12/30/2015 9.9  4.0 - 10.5 K/uL Final  . RBC 12/30/2015 2.80* 4.22 - 5.81 MIL/uL Final  . Hemoglobin 12/30/2015 8.3* 13.0 - 17.0 g/dL Final  . HCT 12/30/2015 25.1* 39.0 - 52.0 % Final  . MCV 12/30/2015 89.6  78.0 - 100.0 fL Final  . MCH 12/30/2015 29.6  26.0 - 34.0 pg Final  . MCHC 12/30/2015 33.1  30.0 - 36.0 g/dL Final  . RDW 12/30/2015 14.1  11.5 - 15.5 % Final  . Platelets 12/30/2015 274  150 - 400 K/uL Final  . Glucose-Capillary 12/29/2015 114* 65 - 99 mg/dL Final  . Comment 1 12/29/2015 Notify RN   Final  . Comment 2 12/29/2015 Document in Chart   Final  . Troponin I 12/30/2015 0.08* <0.03 ng/mL Final  . Troponin I 12/30/2015 0.08* <0.03 ng/mL Final  . Glucose-Capillary 12/30/2015 101* 65 - 99 mg/dL Final  . Comment 1 12/30/2015 Notify RN   Final  . Comment 2 12/30/2015 Document in Chart   Final  . Glucose-Capillary 12/30/2015 116* 65 - 99 mg/dL Final  . Troponin I 12/30/2015 0.08* <0.03  ng/mL Final  . Glucose-Capillary 12/30/2015 130* 65 - 99 mg/dL Final  . Sodium 12/31/2015 139  135 - 145 mmol/L Final  . Potassium 12/31/2015 4.4  3.5 - 5.1 mmol/L Final  . Chloride 12/31/2015 108  101 - 111 mmol/L Final  . CO2 12/31/2015 23  22 - 32 mmol/L Final  . Glucose, Bld 12/31/2015 111* 65 - 99 mg/dL Final  . BUN 12/31/2015 36* 6 - 20 mg/dL Final  . Creatinine, Ser 12/31/2015 1.56* 0.61 - 1.24 mg/dL Final  . Calcium 12/31/2015 8.7* 8.9 - 10.3 mg/dL Final  . GFR calc non Af Amer 12/31/2015 44* >60 mL/min Final  . GFR calc Af Amer 12/31/2015 51* >60 mL/min Final   Comment: (NOTE) The eGFR has been calculated using the CKD EPI equation. This calculation has not been validated in all clinical situations. eGFR's persistently <60 mL/min signify possible Chronic Kidney Disease.   . Anion gap 12/31/2015 8  5 - 15 Final  . Vitamin B-12 12/31/2015 762  180 - 914 pg/mL Final   Comment:  (NOTE) This assay is not validated for testing neonatal or myeloproliferative syndrome specimens for Vitamin B12 levels.   . Folate 12/31/2015 16.1  >5.9 ng/mL Final  . Iron 12/31/2015 16* 45 - 182 ug/dL Final  . TIBC 12/31/2015 358  250 - 450 ug/dL Final  . Saturation Ratios 12/31/2015 4* 17.9 - 39.5 % Final  . UIBC 12/31/2015 342  ug/dL Final  . Ferritin 12/31/2015 30  24 - 336 ng/mL Final  . Retic Ct Pct 12/31/2015 2.4  0.4 - 3.1 % Final  . RBC. 12/31/2015 3.02* 4.22 - 5.81 MIL/uL Final  . Retic Count, Manual 12/31/2015 72.5  19.0 - 186.0 K/uL Final  . WBC 12/31/2015 6.9  4.0 - 10.5 K/uL Final  . RBC 12/31/2015 3.02* 4.22 - 5.81 MIL/uL Final  . Hemoglobin 12/31/2015 8.9* 13.0 - 17.0 g/dL Final  . HCT 12/31/2015 27.0* 39.0 - 52.0 % Final  . MCV 12/31/2015 89.4  78.0 - 100.0 fL Final  . MCH 12/31/2015 29.5  26.0 - 34.0 pg Final  . MCHC 12/31/2015 33.0  30.0 - 36.0 g/dL Final  . RDW 12/31/2015 13.8  11.5 - 15.5 % Final  . Platelets 12/31/2015 303  150 - 400 K/uL Final  . Troponin I 12/31/2015 0.07* <0.03 ng/mL Final  . Glucose-Capillary 12/30/2015 144* 65 - 99 mg/dL Final  . Comment 1 12/30/2015 Notify RN   Final  . Comment 2 12/30/2015 Document in Chart   Final  . Glucose-Capillary 12/31/2015 129* 65 - 99 mg/dL Final  . Comment 1 12/31/2015 Notify RN   Final  . Comment 2 12/31/2015 Document in Chart   Final  . Glucose-Capillary 12/31/2015 109* 65 - 99 mg/dL Final  . Comment 1 12/31/2015 Notify RN   Final  Orders Only on 12/22/2015  Component Date Value Ref Range Status  . Fecal Occult Blood 12/22/2015 NEG  Negative Final   Comment:   The testing platform for fecal occult blood testing is changing from Hemosure iFOB to InSure FIT testing. The StockClerk number for the [employer] is [id number]. For InSure FIT specimen requirements see order codes [id number]) Fecal Globin By Immunochemistry (Medicare). Hemosure iFOB testing will be phased out over the next few months and you  will no longer be able to order [phone]) Fecal Occult Blood, Immunochemical (Medicare). If you have any questions, please contact your [facility] Account Representative directly, or call our Customer Service Department at [phone].  Orders Only on 12/19/2015  Component Date Value Ref Range Status  . PSA 12/19/2015 0.8  <=4.0 ng/mL Final   Comment:   The total PSA value from this assay system is standardized against the WHO standard. The test result will be approximately 20% lower when compared to the equimolar-standardized total PSA (Beckman Coulter). Comparison of serial PSA results should be interpreted with this fact in mind.   This test was performed using the Siemens chemiluminescent method. Values obtained from different assay methods cannot be used interchangeably. PSA levels, regardless of value, should not be interpreted as absolute evidence of the presence or absence of disease.     There may be more visits with results that are not included.    No results found.   Assessment/Plan   ICD-9-CM ICD-10-CM   1. Anemia in stage 3 chronic kidney disease 285.21 N18.3 CBC with Differential/Platelets   585.3 D63.1   2. Chronic systolic heart failure (HCC) 428.22 I50.22   3. CKD (chronic kidney disease) stage 3, GFR 30-59 ml/min 585.3 N18.3   4. Asthma, chronic, mild intermittent, uncomplicated 923.30 Q76.22   5. Uncontrolled type 2 diabetes mellitus with diabetic nephropathy, without long-term current use of insulin (HCC) 250.42 E11.21 insulin detemir (LEVEMIR) 100 UNIT/ML injection   583.81 E11.65   6. Essential hypertension 401.9 I10   7. Hyperlipidemia with target LDL less than 100 272.4 E78.5   8. Bilateral lower extremity edema 782.3 R60.0   9. S/P CABG x 4 V45.81 Z95.1      Increase levemir 54 units 2 times daily  Continue other medications as ordered  tradjenta added to allergy list  Follow up with cardiology and nephrology as  scheduled  Follow up in 4 mos for routine visit. Keep appt with Alisa for AWV. Fasting labs on day of AWV (cmp, lipid, a1c)  Henry Carroll S. Perlie Gold  [facility] [address] [place], [place] [address] [phone] Cell (Monday-Friday 8 AM - 5 PM) [phone] After 5 PM and follow prompts

## 2016-03-09 NOTE — Patient Instructions (Addendum)
Increase levemir 54 units 2 times daily  Continue other medications as ordered  tradjenta added to allergy list  Follow up with cardiology and nephrology as scheduled  Follow up in 4 mos for routine visit

## 2016-04-03 ENCOUNTER — Other Ambulatory Visit: Payer: Self-pay | Admitting: Internal Medicine

## 2016-04-03 NOTE — Telephone Encounter (Signed)
Spoke with patient to confirm he is taking amlodipine 10 MG once daily.

## 2016-04-06 ENCOUNTER — Ambulatory Visit (INDEPENDENT_AMBULATORY_CARE_PROVIDER_SITE_OTHER): Payer: Medicare Other

## 2016-04-06 VITALS — BP 178/88 | HR 78 | Temp 98.3°F | Ht 73.0 in | Wt 235.6 lb

## 2016-04-06 DIAGNOSIS — Z0000 Encounter for general adult medical examination without abnormal findings: Secondary | ICD-10-CM | POA: Diagnosis not present

## 2016-04-06 NOTE — Progress Notes (Signed)
Subjective:   Henry Carroll is a 66 y.o. male who presents for Medicare Annual/Subsequent preventive examination.  Review of Systems:  Cardiac Risk Factors include: advanced age (>27men, >77 women);hypertension;male gender;family history of premature cardiovascular disease;obesity (BMI >30kg/m2);diabetes mellitus;dyslipidemia     Objective:    Vitals: BP (!) 178/88 (BP Location: Left Arm, Patient Position: Sitting, Cuff Size: Normal)   Pulse 78   Temp 98.3 F (36.8 C) (Oral)   Ht 6\' 1"  (1.854 m)   Wt 235 lb 9.6 oz (106.9 kg)   SpO2 98%   BMI 31.08 kg/m   Body mass index is 31.08 kg/m.  Tobacco History  Smoking Status  . Former Smoker  . Years: 17.00  . Types: Cigars  . Quit date: 12/31/2014  Smokeless Tobacco  . Never Used     Counseling given: No   Past Medical History:  Diagnosis Date  . Anemia   . 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 Asthma   . Chronic combined systolic (congestive) and diastolic (congestive) heart failure    a. 12/31/14: 2D ECHO: EF 40-45%, HK of inf myocardium, G1DD, mod MR  . CKD (chronic kidney disease), stage III    stage 3 kidney disease  . Coronary artery disease    a. LHC 01/2015 - triple vessel CAD (mod oLM, mLAD, severe mRCA, intermediate branch stenosis, CTO of mCx). Plan CABG 02/2015.  Marland Kitchen GERD (gastroesophageal reflux disease)   . Hyperlipidemia   . Hypertension   . Mitral regurgitation    a. Mild-mod by echo 12/2014.  Marland Kitchen Myocardial infarction    pt. states per [name] he has in the past  . NSVT (nonsustained ventricular tachycardia) ([place])    a. 9 beats during 01/2015 adm. BB titrated.  . Obesity    a. BMI 33  . Type II diabetes mellitus ([place])    Past Surgical History:  Procedure Laterality Date  . BACK SURGERY    . CARDIAC CATHETERIZATION N/A 02/09/2015   Procedure: Left Heart Cath and Coronary Angiography;  Surgeon: Burnell Blanks, MD;  Location: [facility];  Service: Cardiovascular;  Laterality:  N/A;  . COLONOSCOPY  2011   Dr. [name]: Ileocecal valve appeared normal, scattered pancolonic diverticulosis, difficult bowel prep making smaller lesions potentially missed. Recommended three-year follow-up colonoscopy.  . COLONOSCOPY  2003   Dr. [name]: Suspicious lesion at the ileocecal valve, multiple biopsies benign, pancolonic diverticulosis.  . COLONOSCOPY N/A 02/15/2016   Procedure: COLONOSCOPY;  Surgeon: Daneil Dolin, MD;  [facility];  Service: Endoscopy;  Laterality: N/A;  12:45 pm  . CORONARY ARTERY BYPASS GRAFT N/A 02/18/2015   Procedure: CORONARY ARTERY BYPASS GRAFTING (CABG) x  four, using bilateral internal mammary arteries and right leg greater saphenous vein harvested endoscopically;  Surgeon: [name], MD;  Location: [facility];  Service: Open Heart Surgery;  Laterality: N/A;  . ESOPHAGOGASTRODUODENOSCOPY N/A 02/15/2016   Procedure: ESOPHAGOGASTRODUODENOSCOPY (EGD);  Surgeon: Daneil Dolin, MD;  [facility];  Service: Endoscopy;  Laterality: N/A;  . HERNIA REPAIR  [id number]   Umbilical  . LUMBAR [place] SURGERY  2006   L4 & L5  . TEE WITHOUT CARDIOVERSION N/A 02/18/2015   Procedure: TRANSESOPHAGEAL ECHOCARDIOGRAM (TEE);  Surgeon: [name], MD;  Location: [facility];  Service: Open Heart Surgery;  Laterality: N/A;  . TONSILLECTOMY  1962   Family History  Problem Relation Age of Onset  . Diabetes Mother   . Heart  disease Mother     later in life, age >74  . Heart disease Father     heart failure later in life  . Hypertension Father   . Stroke Sister   . Kidney disease Daughter   . Sudden death Daughter   . Heart attack Neg Hx    History  Sexual Activity  . Sexual activity: Yes  . Birth control/ protection: None    Outpatient Encounter Prescriptions as of 04/06/2016  Medication Sig  . acetaminophen (TYLENOL) 500 MG tablet Take 1,000 mg by mouth 2 (two) times daily as needed for moderate pain.  Marland Kitchen albuterol (PROVENTIL) (2.5 MG/3ML) 0.083% nebulizer  solution USE ONE VIAL (3 ML) IN NEBULIZER EVERY 6 HOURS AS NEEDED FOR WHEEZING OR SHORTNESS OF BREATH. Dx:J45.21, R06.2  . amLODipine (NORVASC) 10 MG tablet TAKE ONE TABLET BY MOUTH ONCE DAILY  . aspirin EC 81 MG tablet Take 1 tablet (81 mg total) by mouth daily.  Marland Kitchen atorvastatin (LIPITOR) 40 MG tablet Take 1 tablet (40 mg total) by mouth every evening.  . benazepril (LOTENSIN) 40 MG tablet TAKE ONE TABLET BY MOUTH ONCE DAILY FOR BLOOD PRESSURE  . carvedilol (COREG) 12.5 MG tablet TAKE ONE TABLET BY MOUTH TWICE DAILY WITH A MEAL  . Cholecalciferol ([employer] VITAMIN D3) 1000 UNITS capsule Take 2 capsules (2,000 Units total) by mouth daily.  Marland Kitchen CINNAMON PO Take 1,000 mg by mouth 2 (two) times daily.   . Coenzyme Q10 (CO Q 10 PO) Take 200 mg by mouth 2 (two) times daily.   . fluticasone (FLONASE) 50 MCG/ACT nasal spray Place 2 sprays into both nostrils daily as needed for allergies or rhinitis (SEASONAL ALLERGIES).  . Fluticasone-Salmeterol (ADVAIR DISKUS) 100-50 MCG/DOSE AEPB Inhale 1 puff into the lungs 2 (two) times daily.  . furosemide (LASIX) 40 MG tablet Take 1 tablet (40 mg total) by mouth 2 (two) times daily.  . hydrALAZINE (APRESOLINE) 25 MG tablet Take 1 tablet (25 mg total) by mouth 2 (two) times daily.  . insulin aspart (NOVOLOG FLEXPEN) 100 UNIT/ML FlexPen INJECT 5 UNITS SUBCUTANEOUSLYIF BLOOD SUGAR IS ABOVE 250, 10 UNITS IF ABOVE 300.  Marland Kitchen insulin detemir (LEVEMIR) 100 UNIT/ML injection Inject 0.54 mLs (54 Units total) into the skin 2 (two) times daily.  . montelukast (SINGULAIR) 10 MG tablet Take 1 tablet (10 mg total) by mouth at bedtime.  . Multiple Vitamins-Minerals (ONE-A-DAY MENS 50+ ADVANTAGE) TABS Take 1 tablet by mouth daily.  . ONE TOUCH ULTRA TEST test strip USE ONE STRIP TO CHECK GLUCOSE THREE TIMES DAILY  . PROAIR HFA 108 (90 Base) MCG/ACT inhaler INHALE TWO PUFFS BY MOUTH EVERY 6 HOURS AS NEEDED FOR SHORTNESS OF BREATH  . terbinafine (LAMISIL) 1 % cream Apply 1 application  topically daily as needed (foot).   . [DISCONTINUED] pantoprazole (PROTONIX) 40 MG tablet Take 1 tablet (40 mg total) by mouth daily.  . [DISCONTINUED] polyethylene glycol-electrolytes (TRILYTE) 420 g solution Take 4,000 mLs by mouth as directed.   No facility-administered encounter medications on file as of 04/06/2016.     Activities of Daily Living In your present state of health, do you have any difficulty performing the following activities: 04/06/2016 02/10/2016  Hearing? N N  Vision? N N  Difficulty concentrating or making decisions? N N  Walking or climbing stairs? N N  Dressing or bathing? N N  Doing errands, shopping? N N  Preparing Food and eating ? N -  Using the Toilet? N -  In the past six  months, have you accidently leaked urine? N -  Do you have problems with loss of bowel control? N -  Managing your Medications? N -  Managing your Finances? N -  Housekeeping or managing your Housekeeping? N -  Some recent data might be hidden    Patient Care Team: Gildardo Cranker, DO as PCP - General (Internal Medicine) Jerline Pain, MD as Consulting Physician (Cardiology) Fleet Contras, MD as Consulting Physician (Nephrology) Rutherford Guys, MD as Consulting Physician (Ophthalmology) Daneil Dolin, MD as Consulting Physician (Gastroenterology)   Assessment:    Exercise Activities and Dietary recommendations Current Exercise Habits: The patient does not participate in regular exercise at present (Pt plans on starting to walk and exercise more), Exercise limited by: None identified  Goals    . Increase physical activity          Starting 04/06/16, I will attempt to increase my physical activity by walking more and playing golf more.       Fall Risk Fall Risk  04/06/2016 03/09/2016 12/21/2015 12/05/2015 09/16/2015  Falls in the past year? No No No No No  Number falls in past yr: - - - - -  Injury with Fall? - - - - -  Risk for fall due to : - - - - -  Risk for fall due to  (comments): - - - - -   Depression Screen PHQ 2/9 Scores 04/06/2016 12/21/2015 12/05/2015 09/14/2015  PHQ - 2 Score 0 0 0 0  PHQ- 9 Score - - - -  Exception Documentation - - - -    Cognitive Function MMSE - Mini Mental State Exam 04/06/2016 12/21/2015 12/01/2014 05/05/2013  Not completed: (No Data) - - -  Orientation to time - 5 5 4   Orientation to Place - 5 5 5   Registration - 3 3 3   Attention/ Calculation - 5 5 5   Recall - 1 1 2   Language- name 2 objects - 2 2 2   Language- repeat - 1 1 1   Language- follow 3 step command - 3 3 3   Language- read & follow direction - 1 1 1   Write a sentence - 1 1 1   Copy design - 1 1 1   Total score - 28 28 28         Immunization History  Administered Date(s) Administered  . Influenza,inj,Quad PF,36+ Mos 10/15/2012, 01/05/2014, 12/01/2014, 12/05/2015  . Influenza-Unspecified 02/04/2012  . Pneumococcal Conjugate-13 02/09/2014  . Pneumococcal Polysaccharide-23 05/05/2013  . Tdap 03/26/2011   Screening Tests Health Maintenance  Topic Date Due  . Hepatitis C Screening  01-Jan-1951  . OPHTHALMOLOGY EXAM  11/18/2014  . HEMOGLOBIN A1C  06/17/2016  . TETANUS/TDAP  03/25/2021  . COLONOSCOPY  02/14/2026  . INFLUENZA VACCINE  Completed  . PNA vac Low Risk Adult  Completed      Plan:    I have personally reviewed and addressed the Medicare Annual Wellness questionnaire and have noted the following in the patient's chart:  A. Medical and social history B. Use of alcohol, tobacco or illicit drugs  C. Current medications and supplements D. Functional ability and status E.  Nutritional status F.  Physical activity G. Advance directives H. List of other physicians I.  Hospitalizations, surgeries, and ER visits in previous 12 months J.  [place] to include hearing, vision, cognitive, depression L. Referrals and appointments - none  In addition, I have reviewed and discussed with patient certain preventive protocols, quality metrics,  and best practice recommendations.  A written personalized care plan for preventive services as well as general preventive health recommendations were provided to patient.  See attached scanned questionnaire for additional information.   Signed,   Allyn Kenner, LPN Health Advisor  I have reviewed the health advisor's note and was available for consultation. I agree with documentation and plan.   Dequarius Jeffries S. Perlie Gold  [facility] [address] [place], [place] [address] [phone] Cell (Monday-Friday 8 AM - 5 PM) [phone] After 5 PM and follow prompts

## 2016-04-06 NOTE — Patient Instructions (Addendum)
Henry Carroll , Thank you for taking time to come for your Medicare Wellness Visit. I appreciate your ongoing commitment to your health goals. Please review the following plan we discussed and let me know if I can assist you in the future.   These are the goals we discussed: Goals    . Increase physical activity          Starting 04/06/16, I will attempt to increase my physical activity by walking more and playing golf more.        This is a list of the screening recommended for you and due dates:  Health Maintenance  Topic Date Due  .  Hepatitis C: One time screening is recommended by Center for Disease Control  (CDC) for  adults born from [birth year] through 1967.   Jun 28, 1950  . Eye exam for diabetics  11/18/2014  . Hemoglobin A1C  06/17/2016  . Tetanus Vaccine  03/25/2021  . Colon Cancer Screening  02/14/2026  . Flu Shot  Completed  . Pneumonia vaccines  Completed  Preventive Care for Adults  A healthy lifestyle and preventive care can promote health and wellness. Preventive health guidelines for adults include the following key practices.  . A routine yearly physical is a good way to check with your health care provider about your health and preventive screening. It is a chance to share any concerns and updates on your health and to receive a thorough exam.  . Visit your dentist for a routine exam and preventive care every 6 months. Brush your teeth twice a day and floss once a day. Good oral hygiene prevents tooth decay and gum disease.  . The frequency of eye exams is based on your age, health, family medical history, use  of contact lenses, and other factors. Follow your health care provider's ecommendations for frequency of eye exams.  . Eat a healthy diet. Foods like vegetables, fruits, whole grains, low-fat dairy products, and lean protein foods contain the nutrients you need without too many calories. Decrease your intake of foods high in solid fats, added sugars, and salt. Eat  the right amount of calories for you. Get information about a proper diet from your health care provider, if necessary.  . Regular physical exercise is one of the most important things you can do for your health. Most adults should get at least 150 minutes of moderate-intensity exercise (any activity that increases your heart rate and causes you to sweat) each week. In addition, most adults need muscle-strengthening exercises on 2 or more days a week.  Silver Sneakers may be a benefit available to you. To determine eligibility, you may visit the website: www.silversneakers.com or contact program at (310)785-6209 Mon-Fri between 8AM-8PM.   . Maintain a healthy weight. The body mass index (BMI) is a screening tool to identify possible weight problems. It provides an estimate of body fat based on height and weight. Your health care provider can find your BMI and can help you achieve or maintain a healthy weight.   For adults 20 years and older: ? A BMI below 18.5 is considered underweight. ? A BMI of 18.5 to 24.9 is normal. ? A BMI of 25 to 29.9 is considered overweight. ? A BMI of 30 and above is considered obese.   . Maintain normal blood lipids and cholesterol levels by exercising and minimizing your intake of saturated fat. Eat a balanced diet with plenty of fruit and vegetables. Blood tests for lipids and cholesterol should begin at age  20 and be repeated every 5 years. If your lipid or cholesterol levels are high, you are over 50, or you are at high risk for heart disease, you may need your cholesterol levels checked more frequently. Ongoing high lipid and cholesterol levels should be treated with medicines if diet and exercise are not working.  . If you smoke, find out from your health care provider how to quit. If you do not use tobacco, please do not start.  . If you choose to drink alcohol, please do not consume more than 2 drinks per day. One drink is considered to be 12 ounces (355 mL)  of beer, 5 ounces (148 mL) of wine, or 1.5 ounces (44 mL) of liquor.  . If you are 32-16 years old, ask your health care provider if you should take aspirin to prevent strokes.  . Use sunscreen. Apply sunscreen liberally and repeatedly throughout the day. You should seek shade when your shadow is shorter than you. Protect yourself by wearing long sleeves, pants, a wide-brimmed hat, and sunglasses year round, whenever you are outdoors.  . Once a month, do a whole body skin exam, using a mirror to look at the skin on your back. Tell your health care provider of new moles, moles that have irregular borders, moles that are larger than a pencil eraser, or moles that have changed in shape or color.

## 2016-04-06 NOTE — Progress Notes (Signed)
Quick Notes   Health Maintenance:  None     Abnormal Screen: Last MMSE was done 11/17.     Patient Concerns:  None    Nurse Concerns:  None

## 2016-04-11 ENCOUNTER — Other Ambulatory Visit: Payer: Self-pay | Admitting: Nurse Practitioner

## 2016-04-11 ENCOUNTER — Encounter: Payer: Self-pay | Admitting: Nurse Practitioner

## 2016-04-11 ENCOUNTER — Ambulatory Visit (INDEPENDENT_AMBULATORY_CARE_PROVIDER_SITE_OTHER): Payer: Medicare Other | Admitting: Nurse Practitioner

## 2016-04-11 VITALS — BP 145/76 | HR 83 | Temp 98.2°F | Ht 73.0 in | Wt 243.4 lb

## 2016-04-11 DIAGNOSIS — I255 Ischemic cardiomyopathy: Secondary | ICD-10-CM | POA: Diagnosis not present

## 2016-04-11 DIAGNOSIS — D509 Iron deficiency anemia, unspecified: Secondary | ICD-10-CM

## 2016-04-11 NOTE — Assessment & Plan Note (Signed)
The patient presents for follow-up for iron deficiency anemia. He has had marketed, interval improvement in his hemoglobin since hospitalization and discharge. Colonoscopy and upper endoscopy completed without evidence to validate GI bleeding as a source of his anemia. Additionally, Hemoccult test 2 has been negative for blood. At this point his last CBC was 2 months ago and I will recheck today. I discussed with him the possibility of capsule endoscopy to image the small bowel for etiology. At this time he is wanting to hold off on this because of multiple procedures he is had recently. Given the fact his hemoglobin improves, his symptoms have improved, and no overt evidence of GI bleed I am okay with this. I will have him return for follow-up in 2 months. I discussed a capsule endoscopy remains on the table should it be needed in the future for any recurrent bleeding or anemia.

## 2016-04-11 NOTE — Progress Notes (Signed)
Referring Provider: Gildardo Cranker, DO Primary Care Physician:  Gildardo Cranker, DO Primary GI:  Dr. [name]  Chief Complaint  Patient presents with  . Anemia    f/u, some weakness    HPI:   Henry Carroll is a 66 y.o. male who presents For follow-up on anemia. The patient was last seen in our office 01/23/2016 for iron deficiency anemia and melena. Noted history of transfusion dependent anemia. Recent hospitalization in November 2017 for fatigue and dyspnea with a noted hemoglobin is 6.5 for which he was transfused 2 units of packed red blood cells. His hemoglobin was 8.9 on discharge. B12 and folate were normal. Iron low, ferritin low-normal. Heme negative stools. Noted improvement after transfusion, occasional heartburn which is diet related otherwise asymptomatic from a GI standpoint. Last colonoscopy was noted to be 2011 with diverticulosis, noted difficult prep. Recommended 3 year repeat with a letter sent to his home with no response. He was scheduled for colonoscopy and upper endoscopy with 12.5 mg preprocedure Phenergan given alcohol history. Recommended updated hemoglobin prior to procedure. ER precautions given.   CBC completed 02/10/2016 found hemoglobin 10.0 which is stable/improved over previous result 2 months prior. EGD completed 02/15/2016 which found normal esophagus, normal stomach, normal duodenum. Colonoscopy completed the same day and found diverticulosis in the sigmoid colon and descending colon, a single 5 mm polyp at the splenic flexure, otherwise normal. Surgical pathology found the polyp to be tubular adenoma. Recommended repeat colonoscopy in 5 years (December 2022). Overall no findings in either upper or lower GI tract to explain anemia via a mechanism of GI bleeding. Recommended two-month follow-up and possible small bowel imaging to complete workup.  Today he states his procedure went well. Doing better overall. Still some fatigue although much better then  previous. Denies abdominal pain, N/V, hematochezia, melena. Denies fever, chills, unintentional weight loss, acute changes in bowel habits/consistency. Has a bowel movement about 1-2 times daily, consistent with [place] 4, no straining. Denies chest pain, dyspnea, dizziness, lightheadedness, syncope, near syncope. Denies any other upper or lower GI symptoms.  Past Medical History:  Diagnosis Date  . Anemia   . 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 Asthma   . Chronic combined systolic (congestive) and diastolic (congestive) heart failure    a. 12/31/14: 2D ECHO: EF 40-45%, HK of inf myocardium, G1DD, mod MR  . CKD (chronic kidney disease), stage III    stage 3 kidney disease  . Coronary artery disease    a. LHC 01/2015 - triple vessel CAD (mod oLM, mLAD, severe mRCA, intermediate branch stenosis, CTO of mCx). Plan CABG 02/2015.  Marland Kitchen GERD (gastroesophageal reflux disease)   . Hyperlipidemia   . Hypertension   . Mitral regurgitation    a. Mild-mod by echo 12/2014.  Marland Kitchen Myocardial infarction    pt. states per [name] he has in the past  . NSVT (nonsustained ventricular tachycardia) ([place])    a. 9 beats during 01/2015 adm. BB titrated.  . Obesity    a. BMI 33  . Type II diabetes mellitus ([place])     Past Surgical History:  Procedure Laterality Date  . BACK SURGERY    . CARDIAC CATHETERIZATION N/A 02/09/2015   Procedure: Left Heart Cath and Coronary Angiography;  Surgeon: Burnell Blanks, MD;  Location: [facility];  Service: Cardiovascular;  Laterality: N/A;  . COLONOSCOPY  2011   Dr. [name]: Ileocecal valve appeared normal, scattered pancolonic diverticulosis, difficult bowel prep  making smaller lesions potentially missed. Recommended three-year follow-up colonoscopy.  . COLONOSCOPY  2003   Dr. [name]: Suspicious lesion at the ileocecal valve, multiple biopsies benign, pancolonic diverticulosis.  . COLONOSCOPY N/A 02/15/2016   Procedure: COLONOSCOPY;  Surgeon: Daneil Dolin, MD;   [facility];  Service: Endoscopy;  Laterality: N/A;  12:45 pm  . CORONARY ARTERY BYPASS GRAFT N/A 02/18/2015   Procedure: CORONARY ARTERY BYPASS GRAFTING (CABG) x  four, using bilateral internal mammary arteries and right leg greater saphenous vein harvested endoscopically;  Surgeon: [name], MD;  Location: [facility];  Service: Open Heart Surgery;  Laterality: N/A;  . ESOPHAGOGASTRODUODENOSCOPY N/A 02/15/2016   Procedure: ESOPHAGOGASTRODUODENOSCOPY (EGD);  Surgeon: Daneil Dolin, MD;  [facility];  Service: Endoscopy;  Laterality: N/A;  . HERNIA REPAIR  8250   Umbilical  . LUMBAR [place] SURGERY  2006   L4 & L5  . TEE WITHOUT CARDIOVERSION N/A 02/18/2015   Procedure: TRANSESOPHAGEAL ECHOCARDIOGRAM (TEE);  Surgeon: [name], MD;  Location: [facility];  Service: Open Heart Surgery;  Laterality: N/A;  . TONSILLECTOMY  1962    Current Outpatient Prescriptions  Medication Sig Dispense Refill  . acetaminophen (TYLENOL) 500 MG tablet Take 1,000 mg by mouth 2 (two) times daily as needed for moderate pain.    Marland Kitchen albuterol (PROVENTIL) (2.5 MG/3ML) 0.083% nebulizer solution USE ONE VIAL (3 ML) IN NEBULIZER EVERY 6 HOURS AS NEEDED FOR WHEEZING OR SHORTNESS OF BREATH. Dx:J45.21, R06.2 300 mL 11  . amLODipine (NORVASC) 10 MG tablet TAKE ONE TABLET BY MOUTH ONCE DAILY 90 tablet 1  . aspirin EC 81 MG tablet Take 1 tablet (81 mg total) by mouth daily. 90 tablet 3  . atorvastatin (LIPITOR) 40 MG tablet Take 1 tablet (40 mg total) by mouth every evening. 90 tablet 3  . benazepril (LOTENSIN) 40 MG tablet TAKE ONE TABLET BY MOUTH ONCE DAILY FOR BLOOD PRESSURE 30 tablet 5  . carvedilol (COREG) 12.5 MG tablet TAKE ONE TABLET BY MOUTH TWICE DAILY WITH A MEAL 60 tablet 8  . Cholecalciferol ([employer] VITAMIN D3) 1000 UNITS capsule Take 2 capsules (2,000 Units total) by mouth daily. 60 capsule 3  . CINNAMON PO Take 1,000 mg by mouth 2 (two) times daily.     . Coenzyme Q10 (CO Q 10 PO) Take 200 mg by  mouth 2 (two) times daily.     . fluticasone (FLONASE) 50 MCG/ACT nasal spray Place 2 sprays into both nostrils daily as needed for allergies or rhinitis (SEASONAL ALLERGIES).    . Fluticasone-Salmeterol (ADVAIR DISKUS) 100-50 MCG/DOSE AEPB Inhale 1 puff into the lungs 2 (two) times daily. 60 each 3  . furosemide (LASIX) 40 MG tablet Take 1 tablet (40 mg total) by mouth 2 (two) times daily. 60 tablet 6  . hydrALAZINE (APRESOLINE) 25 MG tablet Take 1 tablet (25 mg total) by mouth 2 (two) times daily. 180 tablet 3  . insulin aspart (NOVOLOG FLEXPEN) 100 UNIT/ML FlexPen INJECT 5 UNITS SUBCUTANEOUSLYIF BLOOD SUGAR IS ABOVE 250, 10 UNITS IF ABOVE 300. 15 pen 3  . insulin detemir (LEVEMIR) 100 UNIT/ML injection Inject 0.54 mLs (54 Units total) into the skin 2 (two) times daily. 30 mL 6  . montelukast (SINGULAIR) 10 MG tablet Take 1 tablet (10 mg total) by mouth at bedtime. 30 tablet 6  . Multiple Vitamins-Minerals (ONE-A-DAY MENS 50+ ADVANTAGE) TABS Take 1 tablet by mouth daily.    . ONE TOUCH ULTRA TEST test strip USE ONE  STRIP TO CHECK GLUCOSE THREE TIMES DAILY 100 each 11  . PROAIR HFA 108 (90 Base) MCG/ACT inhaler INHALE TWO PUFFS BY MOUTH EVERY 6 HOURS AS NEEDED FOR SHORTNESS OF BREATH 27 each 2  . terbinafine (LAMISIL) 1 % cream Apply 1 application topically daily as needed (foot).      No current facility-administered medications for this visit.     Allergies as of 04/11/2016  . (No Known Allergies)    Family History  Problem Relation Age of Onset  . Diabetes Mother   . Heart disease Mother     later in life, age >15  . Heart disease Father     heart failure later in life  . Hypertension Father   . Stroke Sister   . Kidney disease Daughter   . Sudden death Daughter   . Colon cancer Paternal Uncle     Passed from colon CA  . Heart attack Neg Hx     Social History   Social History  . Marital status: Married    Spouse name: N/A  . Number of children: N/A  . Years of education:  N/A   Social History Main Topics  . Smoking status: Former Smoker    Years: 17.00    Types: Cigars    Quit date: 12/31/2014  . Smokeless tobacco: Never Used  . Alcohol use 0.0 oz/week     Comment: occasional glass of wine.  . Drug use: No  . Sexual activity: Yes    Birth control/ protection: None   Other Topics Concern  . None   Social History Narrative  . None    Review of Systems: General: Negative for anorexia, weight loss, fever, chills. Fatigue continued but significantly improved. ENT: Negative for hoarseness, difficulty swallowing. CV: Negative for chest pain, angina, palpitations, peripheral edema.  Respiratory: Negative for dyspnea at rest, cough, sputum, wheezing.  GI: See history of present illness. Endo: Negative for unusual weight change.  Heme: Negative for bruising or bleeding.  Physical Exam: BP (!) 145/76   Pulse 83   Temp 98.2 F (36.8 C) (Oral)   Ht 6\' 1"  (1.854 m)   Wt 243 lb 6.4 oz (110.4 kg)   BMI 32.11 kg/m  General:   Alert and oriented. Pleasant and cooperative. Well-nourished and well-developed.  Eyes:  Without icterus, sclera clear and conjunctiva pink.  Ears:  Normal auditory acuity. Cardiovascular:  S1, S2 present without murmurs appreciated. Extremities without clubbing or edema. Respiratory:  Clear to auscultation bilaterally. No wheezes, rales, or rhonchi. No distress.  Gastrointestinal:  +BS, rounded but soft, non-tender and non-distended. No HSM noted. No guarding or rebound. No masses appreciated.  Rectal:  Deferred  Musculoskalatal:  Symmetrical without gross deformities. Neurologic:  Alert and oriented x4;  grossly normal neurologically. Psych:  Alert and cooperative. Normal mood and affect. Heme/Lymph/Immune: No excessive bruising noted.    04/11/2016 2:07 PM   Disclaimer: This note was dictated with voice recognition software. Similar sounding words can inadvertently be transcribed and may not be corrected upon review.

## 2016-04-11 NOTE — Patient Instructions (Signed)
1. Have your blood tests done when you're able to. 2. Notify us of any worsening symptoms. 3. Otherwise, return for follow-up in 2 months.

## 2016-04-12 ENCOUNTER — Telehealth: Payer: Self-pay

## 2016-04-12 LAB — CBC WITH DIFFERENTIAL/PLATELET
BASOS ABS: 0 10*3/uL (ref 0.0–0.2)
Basos: 0 %
EOS (ABSOLUTE): 0.2 10*3/uL (ref 0.0–0.4)
Eos: 3 %
Hematocrit: 30.6 % — ABNORMAL LOW (ref 37.5–51.0)
Hemoglobin: 10.6 g/dL — ABNORMAL LOW (ref 13.0–17.7)
Immature Grans (Abs): 0 10*3/uL (ref 0.0–0.1)
Immature Granulocytes: 0 %
LYMPHS ABS: 1.6 10*3/uL (ref 0.7–3.1)
Lymphs: 35 %
MCH: 31.3 pg (ref 26.6–33.0)
MCHC: 34.6 g/dL (ref 31.5–35.7)
MCV: 90 fL (ref 79–97)
MONOS ABS: 0.4 10*3/uL (ref 0.1–0.9)
Monocytes: 8 %
Neutrophils Absolute: 2.5 10*3/uL (ref 1.4–7.0)
Neutrophils: 54 %
Platelets: 293 10*3/uL (ref 150–379)
RBC: 3.39 x10E6/uL — AB (ref 4.14–5.80)
RDW: 15.4 % (ref 12.3–15.4)
WBC: 4.6 10*3/uL (ref 3.4–10.8)

## 2016-04-12 NOTE — Telephone Encounter (Signed)
Received blood work results from [employer] and placed on [employer].

## 2016-04-16 NOTE — Progress Notes (Signed)
CC'D TO PCP °

## 2016-05-05 ENCOUNTER — Other Ambulatory Visit: Payer: Self-pay | Admitting: Internal Medicine

## 2016-05-08 ENCOUNTER — Other Ambulatory Visit: Payer: Self-pay | Admitting: Internal Medicine

## 2016-05-22 ENCOUNTER — Ambulatory Visit: Payer: Medicare Other | Admitting: Nurse Practitioner

## 2016-05-22 ENCOUNTER — Telehealth: Payer: Self-pay

## 2016-05-22 MED ORDER — NITROGLYCERIN 0.4 MG SL SUBL: 0.4000 mg | SUBLINGUAL_TABLET | SUBLINGUAL | 3 refills | Status: DC | PRN

## 2016-05-22 NOTE — Telephone Encounter (Signed)
Patient called in and states that he has been having some chest pain since Saturday night. He took some tylenol but it doesn't seem to help.   Please advise.

## 2016-05-22 NOTE — Telephone Encounter (Addendum)
Pain under right breast that started Sunday night while he was laying in the bed asleep; states pain woke him up. Denies injury, denies tenderness with palpation or worsening pain with movement. States when it started he would have rated it 8 on scale of 1-10 with 10 being worst. States now pain is 5.   Denies SOB or radiation to any other area. States he played golf Friday for the first time in a long time. States he took Tylenol and it did help. I advised that it does not sound cardiac in nature. I advised that since Truitt Merle, NP is in the office I will review with her for additional advice. Patient asks if he needs to have NTG on hand. I advised him I will call him back with Lori's advice. He verbalized understanding and thanked me for the call.    I spoke with Truitt Merle, NP who agreed with my assessment. She advised patient continue to take Tylenol for a few days for pain relief.  She advised that patient may have Rx for NTG to keep on hand in the event of cardiac chest pain. I reviewed her advice with patient who verbalized understanding and thanked me for the call. He states he called and got an appointment with his PCP for this afternoon.

## 2016-05-22 NOTE — Addendum Note (Signed)
Addended by: Emmaline Life on: 05/22/2016 03:21 PM   Modules accepted: Orders

## 2016-05-23 ENCOUNTER — Telehealth: Payer: Self-pay | Admitting: *Deleted

## 2016-05-23 NOTE — Telephone Encounter (Signed)
I called patient and he stated that he was doing much, much better. Stated that his wife had some pain medication and she gave him one and the discomfort went away. Patient stated that he is convinced now that it is muscle related. Offered sooner appointment but patient stated that he will Keep Monday appointment.  I instructed patient that if he had any new symptoms, SOB, Chest Pains, Arm weakness/numbness to go to ER. He agreed. Apologized for being late yesterday.    Patient called c/o of chest pain   1. Where is the pain located? Chest area  2. Does the pain radiate? (left arm or jaw) No  3. Any associated symptoms like shortness of breath,sweating,indigestion, or anxiety? None  4. If patient with history of myocardial infarction/coronary artery disease does the pain feel like your previous episode/heart attack. -Patient feels like it is Muscle related, he called his Cardiologist [name] and they stated the same and told him to call his PCP  5. How long have you had the pain? A couple of days.

## 2016-05-23 NOTE — Telephone Encounter (Signed)
-----   Message from Logan Bores, [place] sent at 05/23/2016  3:55 PM EDT ----- This patient was scheduled yesterday for Chest Pains and arrived late to his appointment and was told to reschedule. Can you please call him and inform him if his symptoms persist or progress before rescheduled appt for Monday 05/28/16 he needs to go to the emergency room. We need to have that document in the event that patient goes into Cardiac Arrest.  We can also offer patient an earlier appointment for tomorrow, use sameday slot or Friday with Dr.Reed she has an opening.   Thanks

## 2016-05-24 NOTE — Telephone Encounter (Signed)
Thank you :)

## 2016-05-28 ENCOUNTER — Encounter: Payer: Self-pay | Admitting: Nurse Practitioner

## 2016-05-28 ENCOUNTER — Ambulatory Visit
Admission: RE | Admit: 2016-05-28 | Discharge: 2016-05-28 | Disposition: A | Discharge status: Home / Self Care | Payer: Medicare Other | Source: Ambulatory Visit | Attending: Nurse Practitioner | Admitting: Nurse Practitioner

## 2016-05-28 ENCOUNTER — Ambulatory Visit (INDEPENDENT_AMBULATORY_CARE_PROVIDER_SITE_OTHER): Payer: Medicare Other | Admitting: Nurse Practitioner

## 2016-05-28 VITALS — BP 148/84 | HR 87 | Temp 98.1°F | Resp 18 | Ht 73.0 in | Wt 228.0 lb

## 2016-05-28 DIAGNOSIS — M940 Chondrocostal junction syndrome [Tietze]: Secondary | ICD-10-CM

## 2016-05-28 DIAGNOSIS — I255 Ischemic cardiomyopathy: Secondary | ICD-10-CM

## 2016-05-28 DIAGNOSIS — R079 Chest pain, unspecified: Secondary | ICD-10-CM | POA: Diagnosis not present

## 2016-05-28 MED ORDER — FUROSEMIDE 40 MG PO TABS: 40.0000 mg | ORAL_TABLET | Freq: Two times a day (BID) | ORAL | 1 refills | Status: DC

## 2016-05-28 NOTE — Progress Notes (Signed)
Careteam: Patient Care Team: Gildardo Cranker, DO as PCP - General (Internal Medicine) Jerline Pain, MD as Consulting Physician (Cardiology) Fleet Contras, MD as Consulting Physician (Nephrology) Rutherford Guys, MD as Consulting Physician (Ophthalmology) Daneil Dolin, MD as Consulting Physician (Gastroenterology)  Advanced Directive information Does Patient Have a Medical Advance Directive?: No  No Known Allergies  Chief Complaint  Patient presents with  . Acute Visit    Pt is being seen due to stabbing pain on right side below arm. Pain started 7 days ago after playing golf.      HPI: Patient is a 66 y.o. male seen in the office today due to right sided chest pain for several days  ~ 10 day. Pain is under right armpit. localized and does not radiate. Able to touch and bring on pain. Felt pain the first time at the driving range but it went away then back when he was playing golf. That night it was intense and he could not sleep. Took a couple of tylenol which helped pain a lot but did not total relieve pain. He took his wives tramadol which total relieved pain but made him nauseous. Reports he would rather have pain than nausea. Taking tylenol with some relief. Taking 2 extra strength tylenol which normally helps any pain but minimally helps this. Pain is constantly there, sometimes it is worse but unsure what really makes it worse because he was sleeping at one point when it got worse after he was active during the day. When he sits in the recliner that helps.  Appetite has been decreased as well as energy.  No shortness of breath, cough or congestion.  Worried about cancer- 2 of his friends have recently been diagnosed with cancer  Weights have been fluctuating since January. Pt reports this is due to fluid build up which he currently does not have any increase in fluids. Has given up meat and he is also losing weight with this. Reports increase in appetite today  Review of  Systems:  Review of Systems  Constitutional: Positive for appetite change. Negative for activity change, chills, fatigue and fever.  Respiratory: Negative for cough and shortness of breath.   Cardiovascular: Positive for chest pain. Negative for palpitations and leg swelling.  Gastrointestinal: Positive for nausea. Negative for constipation.  Neurological: Negative for dizziness, weakness and headaches.    Past Medical History:  Diagnosis Date  . Anemia   . 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 Asthma   . Chronic combined systolic (congestive) and diastolic (congestive) heart failure ([place])    a. 12/31/14: 2D ECHO: EF 40-45%, HK of inf myocardium, G1DD, mod MR  . CKD (chronic kidney disease), stage III    stage 3 kidney disease  . Coronary artery disease    a. LHC 01/2015 - triple vessel CAD (mod oLM, mLAD, severe mRCA, intermediate branch stenosis, CTO of mCx). Plan CABG 02/2015.  Marland Kitchen GERD (gastroesophageal reflux disease)   . Hyperlipidemia   . Hypertension   . Mitral regurgitation    a. Mild-mod by echo 12/2014.  Marland Kitchen Myocardial infarction [facility])    pt. states per [name] he has in the past  . NSVT (nonsustained ventricular tachycardia) ([place])    a. 9 beats during 01/2015 adm. BB titrated.  . Obesity    a. BMI 33  . Type II diabetes mellitus ([place])    Past Surgical History:  Procedure Laterality Date  . BACK SURGERY    .  CARDIAC CATHETERIZATION N/A 02/09/2015   Procedure: Left Heart Cath and Coronary Angiography;  Surgeon: Burnell Blanks, MD;  Location: [facility];  Service: Cardiovascular;  Laterality: N/A;  . COLONOSCOPY  2011   Dr. [name]: Ileocecal valve appeared normal, scattered pancolonic diverticulosis, difficult bowel prep making smaller lesions potentially missed. Recommended three-year follow-up colonoscopy.  . COLONOSCOPY  2003   Dr. [name]: Suspicious lesion at the ileocecal valve, multiple biopsies benign, pancolonic diverticulosis.  . COLONOSCOPY N/A  02/15/2016   Procedure: COLONOSCOPY;  Surgeon: Daneil Dolin, MD;  [facility];  Service: Endoscopy;  Laterality: N/A;  12:45 pm  . CORONARY ARTERY BYPASS GRAFT N/A 02/18/2015   Procedure: CORONARY ARTERY BYPASS GRAFTING (CABG) x  four, using bilateral internal mammary arteries and right leg greater saphenous vein harvested endoscopically;  Surgeon: [name], MD;  Location: [facility];  Service: Open Heart Surgery;  Laterality: N/A;  . ESOPHAGOGASTRODUODENOSCOPY N/A 02/15/2016   Procedure: ESOPHAGOGASTRODUODENOSCOPY (EGD);  Surgeon: Daneil Dolin, MD;  [facility];  Service: Endoscopy;  Laterality: N/A;  . HERNIA REPAIR  3810   Umbilical  . LUMBAR [place] SURGERY  2006   L4 & L5  . TEE WITHOUT CARDIOVERSION N/A 02/18/2015   Procedure: TRANSESOPHAGEAL ECHOCARDIOGRAM (TEE);  Surgeon: [name], MD;  Location: [facility];  Service: Open Heart Surgery;  Laterality: N/A;  . TONSILLECTOMY  1962   Social History:   reports that he quit smoking about 16 months ago. His smoking use included Cigars. He quit after 17.00 years of use. He has never used smokeless tobacco. He reports that he drinks alcohol. He reports that he does not use drugs.  Family History  Problem Relation Age of Onset  . Diabetes Mother   . Heart disease Mother     later in life, age >30  . Heart disease Father     heart failure later in life  . Hypertension Father   . Stroke Sister   . Kidney disease Daughter   . Sudden death Daughter   . Colon cancer Paternal Uncle     Passed from colon CA  . Heart attack Neg Hx     Medications: Patient's Medications  New Prescriptions   No medications on file  Previous Medications   ACETAMINOPHEN (TYLENOL) 500 MG TABLET    Take 1,000 mg by mouth 2 (two) times daily as needed for moderate pain.   ALBUTEROL (PROVENTIL) (2.5 MG/3ML) 0.083% NEBULIZER SOLUTION    USE ONE VIAL (3 ML) IN NEBULIZER EVERY 6 HOURS AS NEEDED FOR WHEEZING OR SHORTNESS OF BREATH. Dx:J45.21,  R06.2   AMLODIPINE (NORVASC) 10 MG TABLET    TAKE ONE TABLET BY MOUTH ONCE DAILY   ASPIRIN EC 81 MG TABLET    Take 1 tablet (81 mg total) by mouth daily.   ATORVASTATIN (LIPITOR) 40 MG TABLET    Take 1 tablet (40 mg total) by mouth every evening.   BENAZEPRIL (LOTENSIN) 40 MG TABLET    TAKE ONE TABLET BY MOUTH ONCE DAILY FOR BLOOD PRESSURE   CARVEDILOL (COREG) 12.5 MG TABLET    TAKE ONE TABLET BY MOUTH TWICE DAILY WITH A MEAL   CHOLECALCIFEROL ([employer]S VITAMIN D3) 1000 UNITS CAPSULE    Take 2 capsules (2,000 Units total) by mouth daily.   CINNAMON PO    Take 1,000 mg by mouth 2 (two) times daily.    COENZYME Q10 (CO Q 10 PO)    Take 200 mg by mouth  2 (two) times daily.    FLUTICASONE (FLONASE) 50 MCG/ACT NASAL SPRAY    Place 2 sprays into both nostrils daily as needed for allergies or rhinitis (SEASONAL ALLERGIES).   FLUTICASONE-SALMETEROL (ADVAIR DISKUS) 100-50 MCG/DOSE AEPB    Inhale 1 puff into the lungs 2 (two) times daily.   FUROSEMIDE (LASIX) 40 MG TABLET    Take 40 mg by mouth 2 (two) times daily.   HYDRALAZINE (APRESOLINE) 25 MG TABLET    Take 1 tablet (25 mg total) by mouth 2 (two) times daily.   INSULIN ASPART (NOVOLOG FLEXPEN) 100 UNIT/ML FLEXPEN    INJECT 5 UNITS SUBCUTANEOUSLYIF BLOOD SUGAR IS ABOVE 250, 10 UNITS IF ABOVE 300.   INSULIN DETEMIR (LEVEMIR) 100 UNIT/ML INJECTION    Inject 0.54 mLs (54 Units total) into the skin 2 (two) times daily.   MONTELUKAST (SINGULAIR) 10 MG TABLET    Take 1 tablet (10 mg total) by mouth at bedtime.   MULTIPLE VITAMINS-MINERALS (ONE-A-DAY MENS 50+ ADVANTAGE) TABS    Take 1 tablet by mouth daily.   NITROGLYCERIN (NITROSTAT) 0.4 MG SL TABLET    Place 1 tablet (0.4 mg total) under the tongue every 5 (five) minutes as needed for chest pain.   ONE TOUCH ULTRA TEST TEST STRIP    USE ONE STRIP TO CHECK GLUCOSE THREE TIMES DAILY   PROAIR HFA 108 (90 BASE) MCG/ACT INHALER    INHALE TWO PUFFS BY MOUTH EVERY 6 HOURS AS NEEDED FOR SHORTNESS OF BREATH   TERBINAFINE  (LAMISIL) 1 % CREAM    Apply 1 application topically daily as needed (foot).   Modified Medications   No medications on file  Discontinued Medications   FUROSEMIDE (LASIX) 20 MG TABLET    Take 40 mg by mouth 2 (two) times daily.   FUROSEMIDE (LASIX) 40 MG TABLET    Take 1 tablet (40 mg total) by mouth 2 (two) times daily.     Physical Exam:  Vitals:   05/28/16 1431  BP: (!) 148/84  Pulse: 87  Resp: 18  Temp: 98.1 F (36.7 C)  TempSrc: Oral  SpO2: 97%  Weight: 228 lb (103.4 kg)  Height: 6\' 1"  (1.854 m)   Body mass index is 30.08 kg/m.  Physical Exam  Constitutional: He appears well-developed and well-nourished. No distress.  Pulmonary/Chest: Effort normal and breath sounds normal. No respiratory distress. He has no wheezes. He has no rales. He exhibits tenderness.  Point tenderness noted between ribs to lateral chest. No rash, swelling, skin changes  Skin: He is not diaphoretic.    Labs reviewed: Basic Metabolic Panel:  Recent Labs  12/19/15 1448  01/16/16 1609 01/24/16 0909 02/10/16 1454  NA 140  < > 141 140 133*  K 4.9  < > 4.8 4.4 4.5  CL 105  < > 109 106 99*  CO2 24  < > 26 25 29   GLUCOSE 55*  < > 57* 151* 182*  BUN 40*  < > 56* 38* 27*  CREATININE 2.10*  < > 1.92* 1.75* 1.76*  CALCIUM 9.0  < > 9.3 9.0 8.7*  TSH 0.94  --   --   --   --   < > = values in this interval not displayed. Liver Function Tests:  Recent Labs  06/01/15 1500 09/16/15 0910 12/19/15 1448  AST  --   --  22  ALT 18 26 19   ALKPHOS  --   --  61  BILITOT  --   --  0.2  PROT  --   --  5.7*  ALBUMIN  --   --  3.2*   No results for input(s): LIPASE, AMYLASE in the last 8760 hours. No results for input(s): AMMONIA in the last 8760 hours. CBC:  Recent Labs  01/16/16 1609 01/24/16 0909 02/10/16 1454 04/11/16 1443  WBC 5.0 5.9 3.7* 4.6  NEUTROABS 3,050  --  2.1 2.5  HGB 8.7* 8.9* 10.0*  --   HCT 26.6* 27.4* 28.6* 30.6*  MCV 90.2 90.4 91.1 90  PLT 351 293 256 293   Lipid  Panel:  Recent Labs  06/01/15 1500 09/16/15 0916 12/19/15 1448  CHOL 149 149 153  HDL 45 56 49  LDLCALC 84 66 78  TRIG 100 133 128  CHOLHDL 3.3 2.7 3.1   TSH:  Recent Labs  12/19/15 1448  TSH 0.94   A1C: Lab Results  Component Value Date   HGBA1C 7.4 (H) 12/19/2015     Assessment/Plan 1. Costochondritis, acute -most likely costochondritis due to pain after he was at the driving range and point tenderness. To cont tylenol as needed  May also use heat Can not use NSAID due to renal function - DG Chest 2 View for further evaluation, rule out fx or other etiology.    Carlos American. Harle Battiest  [facility] [phone] 8 am - 5 pm) [phone] (after hours)

## 2016-05-28 NOTE — Patient Instructions (Addendum)
Costochondritis Costochondritis is swelling and irritation (inflammation) of the tissue (cartilage) that connects your ribs to your breastbone (sternum). This causes pain in the front of your chest. The pain usually starts gradually and involves more than one rib. What are the causes? The exact cause of this condition is not always known. It results from stress on the cartilage where your ribs attach to your sternum. The cause of this stress could be:  Chest injury (trauma).  Exercise or activity, such as lifting.  Severe coughing. What increases the risk? You may be at higher risk for this condition if you:  Are male.  Are 30?68 years old.  Recently started a new exercise or work activity.  Have low levels of vitamin D.  Have a condition that makes you cough frequently. What are the signs or symptoms? The main symptom of this condition is chest pain. The pain:  Usually starts gradually and can be sharp or dull.  Gets worse with deep breathing, coughing, or exercise.  Gets better with rest.  May be worse when you press on the sternum-rib connection (tenderness). How is this diagnosed? This condition is diagnosed based on your symptoms, medical history, and a physical exam. Your health care provider will check for tenderness when pressing on your sternum. This is the most important finding. You may also have tests to rule out other causes of chest pain. These may include:  A chest X-ray to check for lung problems.  An electrocardiogram (ECG) to see if you have a heart problem that could be causing the pain.  An imaging scan to rule out a chest or rib fracture. How is this treated? This condition usually goes away on its own over time. Your health care provider may prescribe an NSAID to reduce pain and inflammation. Your health care provider may also suggest that you:  Rest and avoid activities that make pain worse.  Apply heat or cold to the area to reduce pain and  inflammation.  Do exercises to stretch your chest muscles. If these treatments do not help, your health care provider may inject a numbing medicine at the sternum-rib connection to help relieve the pain. Follow these instructions at home:  Avoid activities that make pain worse. This includes any activities that use chest, abdominal, and side muscles.  If directed, put ice on the painful area:  Put ice in a plastic bag.  Place a towel between your skin and the bag.  Leave the ice on for 20 minutes, 2-3 times a day.  If directed, apply heat to the affected area as often as told by your health care provider. Use the heat source that your health care provider recommends, such as a moist heat pack or a heating pad.  Place a towel between your skin and the heat source.  Leave the heat on for 20-30 minutes.  Remove the heat if your skin turns bright red. This is especially important if you are unable to feel pain, heat, or cold. You may have a greater risk of getting burned.  Take over-the-counter and prescription medicines only as told by your health care provider.  Return to your normal activities as told by your health care provider. Ask your health care provider what activities are safe for you.  Keep all follow-up visits as told by your health care provider. This is important. Contact a health care provider if:  You have chills or a fever.  Your pain does not go away or it gets worse.    You have a cough that does not go away (is persistent). Get help right away if:  You have shortness of breath. This information is not intended to replace advice given to you by your health care provider. Make sure you discuss any questions you have with your health care provider. Document Released: 11/08/2004 Document Revised: 08/19/2015 Document Reviewed: 05/25/2015 Elsevier Interactive Patient Education  2017 [employer]  

## 2016-06-06 ENCOUNTER — Encounter: Payer: Self-pay | Admitting: Nurse Practitioner

## 2016-06-06 ENCOUNTER — Ambulatory Visit (INDEPENDENT_AMBULATORY_CARE_PROVIDER_SITE_OTHER): Payer: Medicare Other | Admitting: Nurse Practitioner

## 2016-06-06 VITALS — BP 137/77 | HR 76 | Temp 97.2°F | Ht 73.0 in | Wt 230.8 lb

## 2016-06-06 DIAGNOSIS — K921 Melena: Secondary | ICD-10-CM | POA: Diagnosis not present

## 2016-06-06 DIAGNOSIS — I255 Ischemic cardiomyopathy: Secondary | ICD-10-CM

## 2016-06-06 DIAGNOSIS — D509 Iron deficiency anemia, unspecified: Secondary | ICD-10-CM

## 2016-06-06 NOTE — Progress Notes (Signed)
Referring Provider: Gildardo Cranker, DO Primary Care Physician:  Gildardo Cranker, DO Primary GI:  Dr. [name]  Chief Complaint  Patient presents with  . Anemia    f/u, doing ok    HPI:   Henry Carroll is a 66 y.o. male who presents For follow-up on iron deficiency anemia. The patient was last seen in our office 04/11/2016. At that time he just completed upper endoscopy and colonoscopy without findings in either to support or explain anemia as sequela of GI bleeding. Recommended two-month follow-up in possible small bowel imaging to complete workup. At the time of his last visit he was doing well overall, still some fatigue but much better than previously. Essentially no GI symptoms at that time. Hemoccult test 2 after his procedures were negative for blood. Recheck CBC in discussed possibility of Givens capsule. At that time he wanted to hold off on Givens because of multiple procedures she has had recently. His CBC was checked to 20 10/02/2015 with continued improvement to a hemoglobin of 10.6. His previous values and order were 10.0, 8.9, 8.7, 8.2. He is continued a sustained positive trend in hemoglobin.  Today he states he's doing well overall. Has slowly gotten more energy, still better than last time but not 100% yet. Denies abdominal pain, N/V, hematochezia, melena, unintentional weight loss, fever, chills, acute changes in bowel habits, consistency, or caliper. 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 Has intermittent chest pain which was evaluated and deemed to be costochondritis/cartilage issues, recommended Tylenol which relieves his symptoms. Denies dyspnea, dizziness, lightheadedness, syncope, near syncope. Denies any other upper or lower GI symptoms.  Is still declining Givens capsule in light of continued improvements.  Past Medical History:  Diagnosis Date  . Anemia   . 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 Asthma   . Chronic combined  systolic (congestive) and diastolic (congestive) heart failure ([place])    a. 12/31/14: 2D ECHO: EF 40-45%, HK of inf myocardium, G1DD, mod MR  . CKD (chronic kidney disease), stage III    stage 3 kidney disease  . Coronary artery disease    a. LHC 01/2015 - triple vessel CAD (mod oLM, mLAD, severe mRCA, intermediate branch stenosis, CTO of mCx). Plan CABG 02/2015.  Marland Kitchen GERD (gastroesophageal reflux disease)   . Hyperlipidemia   . Hypertension   . Mitral regurgitation    a. Mild-mod by echo 12/2014.  Marland Kitchen Myocardial infarction [facility])    pt. states per [name] he has in the past  . NSVT (nonsustained ventricular tachycardia) ([place])    a. 9 beats during 01/2015 adm. BB titrated.  . Obesity    a. BMI 33  . Type II diabetes mellitus ([place])     Past Surgical History:  Procedure Laterality Date  . BACK SURGERY    . CARDIAC CATHETERIZATION N/A 02/09/2015   Procedure: Left Heart Cath and Coronary Angiography;  Surgeon: Burnell Blanks, MD;  Location: [facility];  Service: Cardiovascular;  Laterality: N/A;  . COLONOSCOPY  2011   Dr. [name]: Ileocecal valve appeared normal, scattered pancolonic diverticulosis, difficult bowel prep making smaller lesions potentially missed. Recommended three-year follow-up colonoscopy.  . COLONOSCOPY  2003   Dr. [name]: Suspicious lesion at the ileocecal valve, multiple biopsies benign, pancolonic diverticulosis.  . COLONOSCOPY N/A 02/15/2016   Procedure: COLONOSCOPY;  Surgeon: Daneil Dolin, MD;  [facility];  Service: Endoscopy;  Laterality: N/A;  12:45  pm  . CORONARY ARTERY BYPASS GRAFT N/A 02/18/2015   Procedure: CORONARY ARTERY BYPASS GRAFTING (CABG) x  four, using bilateral internal mammary arteries and right leg greater saphenous vein harvested endoscopically;  Surgeon: [name], MD;  Location: [facility];  Service: Open Heart Surgery;  Laterality: N/A;  . ESOPHAGOGASTRODUODENOSCOPY N/A 02/15/2016   Procedure: ESOPHAGOGASTRODUODENOSCOPY  (EGD);  Surgeon: Daneil Dolin, MD;  [facility];  Service: Endoscopy;  Laterality: N/A;  . HERNIA REPAIR  [id number]   Umbilical  . LUMBAR [place] SURGERY  2006   L4 & L5  . TEE WITHOUT CARDIOVERSION N/A 02/18/2015   Procedure: TRANSESOPHAGEAL ECHOCARDIOGRAM (TEE);  Surgeon: [name], MD;  Location: [facility];  Service: Open Heart Surgery;  Laterality: N/A;  . TONSILLECTOMY  1962    Current Outpatient Prescriptions  Medication Sig Dispense Refill  . acetaminophen (TYLENOL) 500 MG tablet Take 1,000 mg by mouth 2 (two) times daily as needed for moderate pain.    Marland Kitchen albuterol (PROVENTIL) (2.5 MG/3ML) 0.083% nebulizer solution USE ONE VIAL (3 ML) IN NEBULIZER EVERY 6 HOURS AS NEEDED FOR WHEEZING OR SHORTNESS OF BREATH. Dx:J45.21, R06.2 300 mL 11  . amLODipine (NORVASC) 10 MG tablet TAKE ONE TABLET BY MOUTH ONCE DAILY 90 tablet 1  . aspirin EC 81 MG tablet Take 1 tablet (81 mg total) by mouth daily. 90 tablet 3  . atorvastatin (LIPITOR) 40 MG tablet Take 1 tablet (40 mg total) by mouth every evening. 90 tablet 3  . benazepril (LOTENSIN) 40 MG tablet TAKE ONE TABLET BY MOUTH ONCE DAILY FOR BLOOD PRESSURE 90 tablet 1  . carvedilol (COREG) 12.5 MG tablet TAKE ONE TABLET BY MOUTH TWICE DAILY WITH A MEAL 60 tablet 8  . Cholecalciferol ([employer] VITAMIN D3) 1000 UNITS capsule Take 2 capsules (2,000 Units total) by mouth daily. 60 capsule 3  . CINNAMON PO Take 1,000 mg by mouth 2 (two) times daily.     . Coenzyme Q10 (CO Q 10 PO) Take 200 mg by mouth 2 (two) times daily.     . fluticasone (FLONASE) 50 MCG/ACT nasal spray Place 2 sprays into both nostrils daily as needed for allergies or rhinitis (SEASONAL ALLERGIES).    . Fluticasone-Salmeterol (ADVAIR DISKUS) 100-50 MCG/DOSE AEPB Inhale 1 puff into the lungs 2 (two) times daily. 60 each 3  . furosemide (LASIX) 40 MG tablet Take 1 tablet (40 mg total) by mouth 2 (two) times daily. 180 tablet 1  . hydrALAZINE (APRESOLINE) 25 MG tablet Take 1 tablet (25 mg  total) by mouth 2 (two) times daily. 180 tablet 3  . insulin aspart (NOVOLOG FLEXPEN) 100 UNIT/ML FlexPen INJECT 5 UNITS SUBCUTANEOUSLYIF BLOOD SUGAR IS ABOVE 250, 10 UNITS IF ABOVE 300. 15 pen 3  . insulin detemir (LEVEMIR) 100 UNIT/ML injection Inject 0.54 mLs (54 Units total) into the skin 2 (two) times daily. 30 mL 6  . montelukast (SINGULAIR) 10 MG tablet Take 1 tablet (10 mg total) by mouth at bedtime. 30 tablet 6  . Multiple Vitamins-Minerals (ONE-A-DAY MENS 50+ ADVANTAGE) TABS Take 1 tablet by mouth daily.    . nitroGLYCERIN (NITROSTAT) 0.4 MG SL tablet Place 1 tablet (0.4 mg total) under the tongue every 5 (five) minutes as needed for chest pain. 25 tablet 3  . ONE TOUCH ULTRA TEST test strip USE ONE STRIP TO CHECK GLUCOSE THREE TIMES DAILY 100 each 11  . PROAIR HFA 108 (90 Base) MCG/ACT inhaler INHALE TWO PUFFS BY MOUTH EVERY 6 HOURS AS  NEEDED FOR SHORTNESS OF BREATH 27 each 2  . terbinafine (LAMISIL) 1 % cream Apply 1 application topically daily as needed (foot).      No current facility-administered medications for this visit.     Allergies as of 06/06/2016  . (No Known Allergies)    Family History  Problem Relation Age of Onset  . Diabetes Mother   . Heart disease Mother     later in life, age >16  . Heart disease Father     heart failure later in life  . Hypertension Father   . Stroke Sister   . Kidney disease Daughter   . Sudden death Daughter   . Colon cancer Paternal Uncle     Passed from colon CA  . Heart attack Neg Hx     Social History   Social History  . Marital status: Married    Spouse name: N/A  . Number of children: N/A  . Years of education: N/A   Social History Main Topics  . Smoking status: Former Smoker    Years: 17.00    Types: Cigars    Quit date: 12/31/2014  . Smokeless tobacco: Never Used  . Alcohol use 0.0 oz/week     Comment: occasional glass of wine.  . Drug use: No  . Sexual activity: Yes    Birth control/ protection: None    Other Topics Concern  . None   Social History Narrative  . None    Review of Systems: General: Negative for anorexia, weight loss, fever, chills, fatigue, weakness. ENT: Negative for hoarseness, difficulty swallowing. CV: Negative for chest pain, angina, palpitations, peripheral edema.  Respiratory: Negative for dyspnea at rest, cough, sputum, wheezing.  GI: See history of present illness. Endo: Negative for unusual weight change.  Heme: Negative for bruising or bleeding. Allergy: Negative for rash or hives.   Physical Exam: BP 137/77   Pulse 76   Temp 97.2 F (36.2 C) (Oral)   Ht 6\' 1"  (1.854 m)   Wt 230 lb 12.8 oz (104.7 kg)   BMI 30.45 kg/m  General:   Alert and oriented. Pleasant and cooperative. Well-nourished and well-developed.  Eyes:  Without icterus, sclera clear and conjunctiva pink.  Ears:  Normal auditory acuity. Cardiovascular:  S1, S2 present without murmurs appreciated. Extremities without clubbing or edema. Respiratory:  Clear to auscultation bilaterally. No wheezes, rales, or rhonchi. No distress.  Gastrointestinal:  +BS, soft, non-tender and non-distended. No HSM noted. No guarding or rebound. No masses appreciated.  Rectal:  Deferred  Musculoskalatal:  Symmetrical without gross deformities. Neurologic:  Alert and oriented x4;  grossly normal neurologically. Psych:  Alert and cooperative. Normal mood and affect. Heme/Lymph/Immune: No excessive bruising noted.    06/06/2016 2:56 PM   Disclaimer: This note was dictated with voice recognition software. Similar sounding words can inadvertently be transcribed and may not be corrected upon review.

## 2016-06-06 NOTE — Patient Instructions (Signed)
1. Have your labs drawn when you're able to 2. Return for follow-up in 6 months 3. Call if you have any worsening or returning symptoms.

## 2016-06-06 NOTE — Assessment & Plan Note (Signed)
History of melena, he has not had melena since. Anemia continues to improve. At this point I'll recheck labs as per below. Return for follow-up in 6 months. Call us if any worsening of her current symptoms before then.

## 2016-06-06 NOTE — Assessment & Plan Note (Addendum)
Significant iron deficiency anemia that has been improving incrementally over the course of several months. His last hemoglobin was 10.6. I will recheck his CBC as well as iron, iron sat, ferritin. He is wanting to hold off on given study given his continued improvement. Return for follow-up in 6 months. Sooner if labs are worse or if worsening symptoms noted before then.

## 2016-06-08 NOTE — Progress Notes (Signed)
CC'D TO PCP °

## 2016-06-19 ENCOUNTER — Telehealth: Payer: Self-pay

## 2016-06-19 DIAGNOSIS — E119 Type 2 diabetes mellitus without complications: Secondary | ICD-10-CM | POA: Diagnosis not present

## 2016-06-19 DIAGNOSIS — H2513 Age-related nuclear cataract, bilateral: Secondary | ICD-10-CM | POA: Diagnosis not present

## 2016-06-19 DIAGNOSIS — H524 Presbyopia: Secondary | ICD-10-CM | POA: Diagnosis not present

## 2016-06-19 DIAGNOSIS — Z794 Long term (current) use of insulin: Secondary | ICD-10-CM | POA: Diagnosis not present

## 2016-06-19 LAB — HM DIABETES EYE EXAM

## 2016-06-19 NOTE — Telephone Encounter (Signed)
Received eye exam results from Dr. Richardine Service care. Labeled and placed in Dr. Hervey Ard review and sign due to Dr. Saralyn Pilar absence.

## 2016-08-05 ENCOUNTER — Other Ambulatory Visit: Payer: Self-pay | Admitting: Internal Medicine

## 2016-08-05 ENCOUNTER — Other Ambulatory Visit: Payer: Self-pay | Admitting: Cardiology

## 2016-08-05 DIAGNOSIS — J4521 Mild intermittent asthma with (acute) exacerbation: Secondary | ICD-10-CM

## 2016-08-18 ENCOUNTER — Other Ambulatory Visit: Payer: Self-pay | Admitting: Internal Medicine

## 2016-09-11 ENCOUNTER — Other Ambulatory Visit: Payer: Self-pay | Admitting: Cardiology

## 2016-09-26 ENCOUNTER — Encounter: Payer: Self-pay | Admitting: Internal Medicine

## 2016-09-26 ENCOUNTER — Ambulatory Visit (INDEPENDENT_AMBULATORY_CARE_PROVIDER_SITE_OTHER): Payer: Medicare Other | Admitting: Internal Medicine

## 2016-09-26 VITALS — BP 136/90 | HR 75 | Temp 98.8°F | Ht 73.0 in | Wt 222.2 lb

## 2016-09-26 DIAGNOSIS — N183 Chronic kidney disease, stage 3 unspecified: Secondary | ICD-10-CM

## 2016-09-26 DIAGNOSIS — I255 Ischemic cardiomyopathy: Secondary | ICD-10-CM

## 2016-09-26 DIAGNOSIS — E1121 Type 2 diabetes mellitus with diabetic nephropathy: Secondary | ICD-10-CM | POA: Diagnosis not present

## 2016-09-26 DIAGNOSIS — E785 Hyperlipidemia, unspecified: Secondary | ICD-10-CM

## 2016-09-26 DIAGNOSIS — E1165 Type 2 diabetes mellitus with hyperglycemia: Secondary | ICD-10-CM

## 2016-09-26 DIAGNOSIS — I10 Essential (primary) hypertension: Secondary | ICD-10-CM | POA: Diagnosis not present

## 2016-09-26 DIAGNOSIS — J4521 Mild intermittent asthma with (acute) exacerbation: Secondary | ICD-10-CM

## 2016-09-26 DIAGNOSIS — IMO0002 Reserved for concepts with insufficient information to code with codable children: Secondary | ICD-10-CM

## 2016-09-26 DIAGNOSIS — I5022 Chronic systolic (congestive) heart failure: Secondary | ICD-10-CM

## 2016-09-26 DIAGNOSIS — D631 Anemia in chronic kidney disease: Secondary | ICD-10-CM | POA: Diagnosis not present

## 2016-09-26 LAB — CBC WITH DIFFERENTIAL/PLATELET
BASOS ABS: 44 {cells}/uL (ref 0–200)
BASOS PCT: 1 %
EOS ABS: 88 {cells}/uL (ref 15–500)
Eosinophils Relative: 2 %
HEMATOCRIT: 29.1 % — AB (ref 38.5–50.0)
Hemoglobin: 10.1 g/dL — ABNORMAL LOW (ref 13.2–17.1)
Lymphocytes Relative: 20 %
Lymphs Abs: 880 cells/uL (ref 850–3900)
MCH: 32.4 pg (ref 27.0–33.0)
MCHC: 34.7 g/dL (ref 32.0–36.0)
MCV: 93.3 fL (ref 80.0–100.0)
MONO ABS: 704 {cells}/uL (ref 200–950)
MONOS PCT: 16 %
MPV: 8.7 fL (ref 7.5–12.5)
Neutro Abs: 2684 cells/uL (ref 1500–7800)
Neutrophils Relative %: 61 %
PLATELETS: 284 10*3/uL (ref 140–400)
RBC: 3.12 MIL/uL — ABNORMAL LOW (ref 4.20–5.80)
RDW: 13.4 % (ref 11.0–15.0)
WBC: 4.4 10*3/uL (ref 3.8–10.8)

## 2016-09-26 MED ORDER — AZITHROMYCIN 250 MG PO TABS: ORAL_TABLET | ORAL | 0 refills | Status: DC

## 2016-09-26 MED ORDER — ZOSTER VAC RECOMB ADJUVANTED 50 MCG/0.5ML IM SUSR: 0.5000 mL | Freq: Once | INTRAMUSCULAR | 1 refills | Status: AC

## 2016-09-26 NOTE — Progress Notes (Signed)
Patient ID: Henry Carroll, male   DOB: [birth year], 66 y.o.   MRN: [id number]    Location:  [facility] Place of Service: OFFICE  Chief Complaint  Patient presents with  . Acute Visit    Stomach pain, muscle pains, sore throat, and productive (brown) since Saturday   . Medication Refill    No refills needed   . Health Maintenance    Refused Hep C screening, A1c due     HPI:  66 yo male seen today for health concerns. He reports 4 day hx abdominal pain, myalgias and productive cough with brown sputum. Sx's began after returning on a red eye flight from [place]. No fever but has chills. No N/V. He had left sided abdominal pain that has actually improved. He also reports scratchy throat, SOB. No HA, dizziness, ear pain/pressure, sinus pressure, CP/tightness. Nebulizer helped SOB. Tylenol helped myalgias. BS 90-150s, occas 180s.  He did not f/u with GI regarding melena. No further issues. He declined capsule endoscopy to further assess. No etiology ever found for his melena episode  multivessel CAD - stable. He underwent CABG x 4 vessels by Dr Charlott Rakes on 02/18/15. Vein harvested from right leg. ABIs 0.62 on right and 0.28 on left.  Followed by cardio Dr Marlou Porch. Released from CT surgery. Completed cardiac rehab in July 2017.  Chronic systolic CHF - weight 448 lbs today (down 28 lbs since Jan 2018). Followed by cardio Dr Marlou Porch. 2D echo revealed EF 35-40% with diffuse hypokinesis and grade 2 DD; mild MR/mild dilated LA; mod TR. He is NYHA class 2. Cardio never started [employer]. Return appt scheduled for 1 yr  DM - BS at home 90s fasting, at night 140-150s.  No low BS reactions. He has not needed novolog. No numbness or tingling. Takes tradjenta and levemir. Uses 54 units levemir BID most days. A1c 7.4%; urine microalbumin/Cr ratio 1202  HTN - stable on lasix, coreg, hydralazine, amlodipine. Takes ASA daily. Followed by cardio Dr Marlou Porch  Hyperlipidemia - stable on statin. No myalgias. LDL 78  Asthma - he  has used neb recently 2/2 SOB which helped. He takes advair and albuterol neb. Takes singulair  CKD - followed by nephrology Dr Mercy Moore. Last Cr 1.76 in EPIC  AOCD - Hgb 10. In the past, he has been transfused with PRBCs and rec'd iron infusions. Ferritin 18; iron 40.   Last albumin 3.2; total protein 5.7  Past Medical History:  Diagnosis Date  . Anemia   . 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 Asthma   . Chronic combined systolic (congestive) and diastolic (congestive) heart failure ([place])    a. 12/31/14: 2D ECHO: EF 40-45%, HK of inf myocardium, G1DD, mod MR  . CKD (chronic kidney disease), stage III    stage 3 kidney disease  . Coronary artery disease    a. LHC 01/2015 - triple vessel CAD (mod oLM, mLAD, severe mRCA, intermediate branch stenosis, CTO of mCx). Plan CABG 02/2015.  Marland Kitchen GERD (gastroesophageal reflux disease)   . Hyperlipidemia   . Hypertension   . Mitral regurgitation    a. Mild-mod by echo 12/2014.  Marland Kitchen Myocardial infarction [facility])    pt. states per [name] he has in the past  . NSVT (nonsustained ventricular tachycardia) ([place])    a. 9 beats during 01/2015 adm. BB titrated.  . Obesity    a. BMI 33  . Type II diabetes mellitus ([place])     Past Surgical History:  Procedure Laterality  Date  . BACK SURGERY    . CARDIAC CATHETERIZATION N/A 02/09/2015   Procedure: Left Heart Cath and Coronary Angiography;  Surgeon: Burnell Blanks, MD;  Location: [place] [facility];  Service: Cardiovascular;  Laterality: N/A;  . COLONOSCOPY  2011   Dr. [name]: Ileocecal valve appeared normal, scattered pancolonic diverticulosis, difficult bowel prep making smaller lesions potentially missed. Recommended three-year follow-up colonoscopy.  . COLONOSCOPY  2003   Dr. [name]: Suspicious lesion at the ileocecal valve, multiple biopsies benign, pancolonic diverticulosis.  . COLONOSCOPY N/A 02/15/2016   Procedure: COLONOSCOPY;  Surgeon: Daneil Dolin, MD;  [facility];  Service:  Endoscopy;  Laterality: N/A;  12:45 pm  . CORONARY ARTERY BYPASS GRAFT N/A 02/18/2015   Procedure: CORONARY ARTERY BYPASS GRAFTING (CABG) x  four, using bilateral internal mammary arteries and right leg greater saphenous vein harvested endoscopically;  Surgeon: [name], MD;  Location: [facility];  Service: Open Heart Surgery;  Laterality: N/A;  . ESOPHAGOGASTRODUODENOSCOPY N/A 02/15/2016   Procedure: ESOPHAGOGASTRODUODENOSCOPY (EGD);  Surgeon: Daneil Dolin, MD;  Location[facility];  Service: Endoscopy;  Laterality: N/A;  . HERNIA REPAIR  [id number]   Umbilical  . LUMBAR [place] SURGERY  2006   L4 & L5  . TEE WITHOUT CARDIOVERSION N/A 02/18/2015   Procedure: TRANSESOPHAGEAL ECHOCARDIOGRAM (TEE);  Surgeon: [name], MD;  Location: [place];  Service: Open Heart Surgery;  Laterality: N/A;  . TONSILLECTOMY  1962    Patient Care Team: Gildardo Cranker, DO as PCP - General (Internal Medicine) Jerline Pain, MD as Consulting Physician (Cardiology) Fleet Contras, MD as Consulting Physician (Nephrology) Rutherford Guys, MD as Consulting Physician (Ophthalmology) [name], MD as Consulting Physician (Gastroenterology)  Social History   Social History  . Marital status: Married    Spouse name: N/A  . Number of children: N/A  . Years of education: N/A   Occupational History  . Not on file.   Social History Main Topics  . Smoking status: Former Smoker    Years: 17.00    Types: Cigars    Quit date: 12/31/2014  . Smokeless tobacco: Never Used  . Alcohol use 0.0 oz/week     Comment: occasional glass of wine.  . Drug use: No  . Sexual activity: Yes    Birth control/ protection: None   Other Topics Concern  . Not on file   Social History Narrative  . No narrative on file     reports that he quit smoking about 20 months ago. His smoking use included Cigars. He quit after 17.00 years of use. He has never used smokeless tobacco. He reports that he drinks alcohol. He reports that  he does not use drugs.  Family History  Problem Relation Age of Onset  . Diabetes Mother   . Heart disease Mother        later in life, age >52  . Heart disease Father        heart failure later in life  . Hypertension Father   . Stroke Sister   . Kidney disease Daughter   . Sudden death Daughter   . Colon cancer Paternal Uncle        Passed from colon CA  . Heart attack Neg Hx    Family Status  Relation Status  . Mother Deceased at age 31       Cause of Death: Complications of diabetes  . Father Deceased at age 23  Cause of Death: Heart disease  . Sister Deceased at age 63       Cause of Death: CVA  . Brother Alive  . Daughter Alive  . Sister Alive  . MGM Deceased  . MGF Deceased  . PGM Deceased  . PGF Deceased  . Daughter (Not Specified)  . Daughter (Not Specified)  . Annamarie Major Deceased  . Neg Hx (Not Specified)     No Known Allergies  Medications: Patient's Medications  New Prescriptions   AZITHROMYCIN (ZITHROMAX) 250 MG TABLET    Take 2 tabs po on DAY 1 then take 1 tab po daily x 4 days  Previous Medications   ACETAMINOPHEN (TYLENOL) 500 MG TABLET    Take 1,000 mg by mouth 2 (two) times daily as needed for moderate pain.   ALBUTEROL (PROVENTIL) (2.5 MG/3ML) 0.083% NEBULIZER SOLUTION    USE ONE VIAL (3 ML) IN NEBULIZER EVERY 6 HOURS AS NEEDED FOR WHEEZING OR SHORTNESS OF BREATH. Dx:J45.21, R06.2   AMLODIPINE (NORVASC) 10 MG TABLET    TAKE ONE TABLET BY MOUTH ONCE DAILY   ASPIRIN EC 81 MG TABLET    Take 1 tablet (81 mg total) by mouth daily.   ATORVASTATIN (LIPITOR) 40 MG TABLET    Take 1 tablet (40 mg total) by mouth every evening.   BENAZEPRIL (LOTENSIN) 40 MG TABLET    TAKE ONE TABLET BY MOUTH ONCE DAILY FOR BLOOD PRESSURE   CARVEDILOL (COREG) 12.5 MG TABLET    TAKE ONE TABLET BY MOUTH TWICE DAILY WITH A MEAL   CHOLECALCIFEROL ([employer] VITAMIN D3) 1000 UNITS CAPSULE    Take 2 capsules (2,000 Units total) by mouth daily.   CINNAMON PO    Take 1,000 mg by  mouth 2 (two) times daily.    COENZYME Q10 (CO Q 10 PO)    Take 200 mg by mouth 2 (two) times daily.    FLUTICASONE (FLONASE) 50 MCG/ACT NASAL SPRAY    Place 2 sprays into both nostrils daily as needed for allergies or rhinitis (SEASONAL ALLERGIES).   FLUTICASONE-SALMETEROL (ADVAIR DISKUS) 100-50 MCG/DOSE AEPB    Inhale 1 puff into the lungs 2 (two) times daily.   FUROSEMIDE (LASIX) 40 MG TABLET    Take 1 tablet (40 mg total) by mouth 2 (two) times daily.   GLUCOSE BLOOD (ONE TOUCH ULTRA TEST) TEST STRIP    Use one strip to check glucose three times daily. Dx: E11.29   HYDRALAZINE (APRESOLINE) 25 MG TABLET    Take 1 tablet (25 mg total) by mouth 2 (two) times daily.   INSULIN ASPART (NOVOLOG FLEXPEN) 100 UNIT/ML FLEXPEN    INJECT 5 UNITS SUBCUTANEOUSLYIF BLOOD SUGAR IS ABOVE 250, 10 UNITS IF ABOVE 300.   INSULIN DETEMIR (LEVEMIR) 100 UNIT/ML INJECTION    Inject 0.54 mLs (54 Units total) into the skin 2 (two) times daily.   MONTELUKAST (SINGULAIR) 10 MG TABLET    TAKE ONE TABLET BY MOUTH ONCE DAILY AT BEDTIME   MULTIPLE VITAMINS-MINERALS (ONE-A-DAY MENS 50+ ADVANTAGE) TABS    Take 1 tablet by mouth daily.   NITROGLYCERIN (NITROSTAT) 0.4 MG SL TABLET    Place 1 tablet (0.4 mg total) under the tongue every 5 (five) minutes as needed for chest pain.   PROAIR HFA 108 (90 BASE) MCG/ACT INHALER    INHALE TWO PUFFS BY MOUTH EVERY 6 HOURS AS NEEDED FOR SHORTNESS OF BREATH   TERBINAFINE (LAMISIL) 1 % CREAM    Apply 1 application topically daily as needed (  foot).   Modified Medications   No medications on file  Discontinued Medications   ZOSTER VAC RECOMB ADJUVANTED (SHINGRIX) INJECTION    Inject 0.5 mLs into the muscle once.    Review of Systems  Constitutional: Positive for chills. Negative for unexpected weight change.  Respiratory: Positive for cough and shortness of breath.   Gastrointestinal: Positive for abdominal pain.  All other systems reviewed and are negative.   Vitals:   09/26/16 0817    BP: 136/90  Pulse: 75  Temp: 98.8 F (37.1 C)  TempSrc: Oral  SpO2: 96%  Weight: 222 lb 3.2 oz (100.8 kg)  Height: '6\' 1"'$  (1.854 m)   Body mass index is 29.32 kg/m.  Physical Exam  Constitutional: He is oriented to person, place, and time. He appears well-developed and well-nourished.  Looks ill in NAD, no conversational dyspnea  HENT:  Mouth/Throat: Oropharynx is clear and moist.  MMM; no oral thrush  Eyes: Pupils are equal, round, and reactive to light. No scleral icterus.  Neck: Neck supple. Carotid bruit is not present. No thyromegaly present.  Cardiovascular: Normal rate, regular rhythm and intact distal pulses.   Occasional extrasystoles are present. Exam reveals no gallop and no friction rub.   Murmur heard.  Systolic murmur is present with a grade of 1/6  Trace LE edema b/l. No calf TTP  Pulmonary/Chest: Effort normal. He has wheezes (end expiratory with prolonged expiratory phase). He has no rales. He exhibits no tenderness.  Abdominal: Soft. Normal appearance and bowel sounds are normal. He exhibits no distension, no abdominal bruit, no pulsatile midline mass and no mass. There is no hepatomegaly. There is no tenderness. There is no rigidity, no rebound and no guarding. No hernia.  Musculoskeletal: He exhibits edema.  Lymphadenopathy:    He has no cervical adenopathy.  Neurological: He is alert and oriented to person, place, and time. He has normal reflexes.  Skin: Skin is warm and dry. No rash noted.  Psychiatric: He has a normal mood and affect. His behavior is normal. Judgment and thought content normal.     Labs reviewed: No recent labs  No results found.   Assessment/Plan   ICD-10-CM   1. Mild intermittent asthma with exacerbation J45.21 azithromycin (ZITHROMAX) 250 MG tablet  2. Uncontrolled type 2 diabetes mellitus with diabetic nephropathy, without long-term current use of insulin (HCC) E11.21 CMP with eGFR   E11.65 Hemoglobin A1c     Microalbumin/Creatinine Ratio, Urine  3. Chronic systolic heart failure (HCC) I50.22   4. Anemia in stage 3 chronic kidney disease N18.3 CBC with Differential/Platelets   D63.1 Ferritin    Iron and TIBC  5. Essential hypertension I10   6. Hyperlipidemia with target LDL less than 100 E78.5 Lipid panel   Please schedule October vist with GI Walden Field, NP-C) to follow up on melena.  Take azithromycin as ordered. Recommend probiotic/yogurt daily whil eon med for colon health  Will call with lab results  Continue current medications as ordered  Follow up in 3 mos AWV/CPE.    Jiaire Rosebrook S. Perlie Gold  [facility] [address] [place], [place] [address] [phone] Cell (Monday-Friday 8 AM - 5 PM) [phone] After 5 PM and follow prompts

## 2016-09-26 NOTE — Patient Instructions (Addendum)
Please schedule October vist with GI Walden Field, NP-C) to follow up on melena.  Take azithromycin as ordered. Recommend probiotic/yogurt daily whil eon med for colon health  Will call with lab results  Continue current medications as ordered  Follow up in 3 mos AWV/CPE.

## 2016-09-27 LAB — COMPLETE METABOLIC PANEL WITH GFR
ALBUMIN: 3.2 g/dL — AB (ref 3.6–5.1)
ALK PHOS: 85 U/L (ref 40–115)
ALT: 19 U/L (ref 9–46)
AST: 20 U/L (ref 10–35)
BUN: 29 mg/dL — ABNORMAL HIGH (ref 7–25)
CALCIUM: 9 mg/dL (ref 8.6–10.3)
CO2: 23 mmol/L (ref 20–32)
CREATININE: 1.76 mg/dL — AB (ref 0.70–1.25)
Chloride: 104 mmol/L (ref 98–110)
GFR, EST AFRICAN AMERICAN: 45 mL/min — AB (ref >=60)
GFR, EST NON AFRICAN AMERICAN: 39 mL/min — AB (ref >=60)
Glucose, Bld: 140 mg/dL — ABNORMAL HIGH (ref 65–99)
Potassium: 4.2 mmol/L (ref 3.5–5.3)
Sodium: 139 mmol/L (ref 135–146)
TOTAL PROTEIN: 5.6 g/dL — AB (ref 6.1–8.1)
Total Bilirubin: 0.3 mg/dL (ref 0.2–1.2)

## 2016-09-27 LAB — LIPID PANEL
CHOLESTEROL: 222 mg/dL — AB (ref <200)
HDL: 59 mg/dL (ref >40)
LDL Cholesterol: 130 mg/dL — ABNORMAL HIGH (ref <100)
TRIGLYCERIDES: 164 mg/dL — AB (ref <150)
Total CHOL/HDL Ratio: 3.8 Ratio (ref <5.0)
VLDL: 33 mg/dL — ABNORMAL HIGH (ref <30)

## 2016-09-27 LAB — FERRITIN: FERRITIN: 43 ng/mL (ref 20–380)

## 2016-09-27 LAB — IRON AND TIBC
%SAT: 6 % — AB (ref 15–60)
IRON: 17 ug/dL — AB (ref 50–180)
TIBC: 309 ug/dL (ref 250–425)
UIBC: 292 ug/dL

## 2016-09-27 LAB — HEMOGLOBIN A1C
Hgb A1c MFr Bld: 10.1 % — ABNORMAL HIGH (ref <5.7)
Mean Plasma Glucose: 243 mg/dL

## 2016-09-27 LAB — MICROALBUMIN / CREATININE URINE RATIO

## 2016-10-04 DIAGNOSIS — D649 Anemia, unspecified: Secondary | ICD-10-CM | POA: Diagnosis not present

## 2016-10-04 DIAGNOSIS — E1129 Type 2 diabetes mellitus with other diabetic kidney complication: Secondary | ICD-10-CM | POA: Diagnosis not present

## 2016-10-04 DIAGNOSIS — Z6834 Body mass index (BMI) 34.0-34.9, adult: Secondary | ICD-10-CM | POA: Diagnosis not present

## 2016-10-04 DIAGNOSIS — N183 Chronic kidney disease, stage 3 (moderate): Secondary | ICD-10-CM | POA: Diagnosis not present

## 2016-10-04 DIAGNOSIS — I10 Essential (primary) hypertension: Secondary | ICD-10-CM | POA: Diagnosis not present

## 2016-10-04 DIAGNOSIS — D472 Monoclonal gammopathy: Secondary | ICD-10-CM | POA: Diagnosis not present

## 2016-10-04 DIAGNOSIS — R809 Proteinuria, unspecified: Secondary | ICD-10-CM | POA: Diagnosis not present

## 2016-10-22 ENCOUNTER — Other Ambulatory Visit: Payer: Self-pay | Admitting: Internal Medicine

## 2016-11-07 ENCOUNTER — Ambulatory Visit (INDEPENDENT_AMBULATORY_CARE_PROVIDER_SITE_OTHER): Payer: Medicare Other | Admitting: Nurse Practitioner

## 2016-11-07 ENCOUNTER — Encounter: Payer: Self-pay | Admitting: Nurse Practitioner

## 2016-11-07 VITALS — BP 160/80 | HR 66 | Ht 73.0 in | Wt 238.4 lb

## 2016-11-07 DIAGNOSIS — I5042 Chronic combined systolic (congestive) and diastolic (congestive) heart failure: Secondary | ICD-10-CM | POA: Diagnosis not present

## 2016-11-07 DIAGNOSIS — I255 Ischemic cardiomyopathy: Secondary | ICD-10-CM

## 2016-11-07 DIAGNOSIS — I110 Hypertensive heart disease with heart failure: Secondary | ICD-10-CM | POA: Diagnosis not present

## 2016-11-07 DIAGNOSIS — I2589 Other forms of chronic ischemic heart disease: Secondary | ICD-10-CM

## 2016-11-07 MED ORDER — FUROSEMIDE 40 MG PO TABS: 60.0000 mg | ORAL_TABLET | Freq: Two times a day (BID) | ORAL | 3 refills | Status: DC

## 2016-11-07 NOTE — Patient Instructions (Addendum)
We will be checking the following labs today - BMET, CBC, BNP, HPF  Medication Instructions:    Continue with your current medicines. BUT  I am increasing your Lasix to 60 mg to take twice a day     Testing/Procedures To Be Arranged:  Echocardiogram  Lexiscan Myoview  Follow-Up:   See me in about 2 weeks.     Other Special Instructions:   Daily weights  Restrict your salt as we talked about    If you need a refill on your cardiac medications before your next appointment, please call your pharmacy.   Call the [place] office at [phone] if you have any questions, problems or concerns.

## 2016-11-07 NOTE — Progress Notes (Signed)
CARDIOLOGY OFFICE NOTE  Date:  11/07/2016    [name] Date of Birth: 19-Apr-1950 Medical Record #[id number]  PCP:  Gildardo Cranker, DO  Cardiologist:  [facility]   Chief Complaint  Patient presents with  . Coronary Artery Disease  . Congestive Heart Failure    Follow up visit - seen for [name]    History of Present Illness: Henry Carroll is a 66 y.o. male who presents today for a follow up visit. Seen for [name].   He has known CAD s/p1/6/17 CABG 4 with LIMA to LAD, free right internal mammary graft to ramus, SVG to OM, SVG to PDA. 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 Mild to moderate MR. Prior nuclear stress test was intermediate risk showing a medium-sized severe basal to mid inferior inferolateral perfusion defect. EF on nuclear stress was 30%. Echo from February showed improvement with EF of 45 to 50%.   I last saw him back in December. He had been admitted the month before with profound anemia - required transfusion. I got him back to GI.   He saw [name] in January - still with some shortness of breath with exertion.  Comes in today. Here alone. Had ?URI/asthma exacerbation last month - got antibiotics - felt better initially but still congested. BP is high here today - typically better at home. Was ok at nephrology just a few weeks ago - their note is reviewed. Weight is up. Coughing - especially if he lies down - frothy sputum at times. Some swelling. He has been eating several hotdogs and sauerkraut every day. Weight is up at home. He has some DOE - says it is not too bad. No chest pain but sounds like he never had actual chest pain prior to his CABG.   Past Medical History:  Diagnosis Date  . Anemia   . 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 Asthma   . Chronic combined systolic (congestive) and diastolic (congestive) heart failure ([facility])     a. 12/31/14: 2D ECHO: EF 40-45%, HK of inf myocardium, G1DD, mod MR  . CKD (chronic kidney disease), stage III    stage 3 kidney disease  . Coronary artery disease    a. LHC 01/2015 - triple vessel CAD (mod oLM, mLAD, severe mRCA, intermediate branch stenosis, CTO of mCx). Plan CABG 02/2015.  Marland Kitchen GERD (gastroesophageal reflux disease)   . Hyperlipidemia   . Hypertension   . Mitral regurgitation    a. Mild-mod by echo 12/2014.  Marland Kitchen Myocardial infarction [facility])    pt. states per [name] he has in the past  . NSVT (nonsustained ventricular tachycardia) ([place])    a. 9 beats during 01/2015 adm. BB titrated.  . Obesity    a. BMI 33  . Type II diabetes mellitus ([place])     Past Surgical History:  Procedure Laterality Date  . BACK SURGERY    . CARDIAC CATHETERIZATION N/A 02/09/2015   Procedure: Left Heart Cath and Coronary Angiography;  Surgeon: Burnell Blanks, MD;  Location: [facility];  Service: Cardiovascular;  Laterality: N/A;  . COLONOSCOPY  2011   Dr. [name]: Ileocecal valve appeared normal, scattered pancolonic diverticulosis, difficult bowel prep making smaller lesions potentially missed. Recommended three-year follow-up colonoscopy.  . COLONOSCOPY  2003   Dr. [name]: Suspicious lesion at the ileocecal valve, multiple biopsies benign, pancolonic diverticulosis.  Marland Kitchen  COLONOSCOPY N/A 02/15/2016   Procedure: COLONOSCOPY;  Surgeon: Daneil Dolin, MD;  [facility];  Service: Endoscopy;  Laterality: N/A;  12:45 pm  . CORONARY ARTERY BYPASS GRAFT N/A 02/18/2015   Procedure: CORONARY ARTERY BYPASS GRAFTING (CABG) x  four, using bilateral internal mammary arteries and right leg greater saphenous vein harvested endoscopically;  Surgeon: [name], MD;  Location: [facility];  Service: Open Heart Surgery;  Laterality: N/A;  . ESOPHAGOGASTRODUODENOSCOPY N/A 02/15/2016   Procedure: ESOPHAGOGASTRODUODENOSCOPY (EGD);  Surgeon: Daneil Dolin, MD;  Location: [facility];  Service:  Endoscopy;  Laterality: N/A;  . HERNIA REPAIR  [id number]   Umbilical  . LUMBAR [place] SURGERY  2006   L4 & L5  . TEE WITHOUT CARDIOVERSION N/A 02/18/2015   Procedure: TRANSESOPHAGEAL ECHOCARDIOGRAM (TEE);  Surgeon: [name], MD;  Location: [facility];  Service: Open Heart Surgery;  Laterality: N/A;  . TONSILLECTOMY  1962     Medications: Current Meds  Medication Sig  . acetaminophen (TYLENOL) 500 MG tablet Take 1,000 mg by mouth 2 (two) times daily as needed for moderate pain.  Marland Kitchen albuterol (PROVENTIL) (2.5 MG/3ML) 0.083% nebulizer solution USE ONE VIAL (3 ML) IN NEBULIZER EVERY 6 HOURS AS NEEDED FOR WHEEZING OR SHORTNESS OF BREATH. Dx:J45.21, R06.2  . amLODipine (NORVASC) 10 MG tablet TAKE 1 TABLET BY MOUTH ONCE DAILY  . aspirin EC 81 MG tablet Take 1 tablet (81 mg total) by mouth daily.  Marland Kitchen atorvastatin (LIPITOR) 40 MG tablet Take 1 tablet (40 mg total) by mouth every evening.  . benazepril (LOTENSIN) 40 MG tablet TAKE ONE TABLET BY MOUTH ONCE DAILY FOR BLOOD PRESSURE  . carvedilol (COREG) 12.5 MG tablet TAKE ONE TABLET BY MOUTH TWICE DAILY WITH A MEAL  . Cholecalciferol ([employer] VITAMIN D3) 1000 UNITS capsule Take 2 capsules (2,000 Units total) by mouth daily.  Marland Kitchen CINNAMON PO Take 1,000 mg by mouth 2 (two) times daily.   . Coenzyme Q10 (CO Q 10 PO) Take 200 mg by mouth 2 (two) times daily.   . fluticasone (FLONASE) 50 MCG/ACT nasal spray Place 2 sprays into both nostrils daily as needed for allergies or rhinitis (SEASONAL ALLERGIES).  . Fluticasone-Salmeterol (ADVAIR DISKUS) 100-50 MCG/DOSE AEPB Inhale 1 puff into the lungs 2 (two) times daily.  . furosemide (LASIX) 40 MG tablet Take 1.5 tablets (60 mg total) by mouth 2 (two) times daily.  Marland Kitchen glucose blood (ONE TOUCH ULTRA TEST) test strip Use one strip to check glucose three times daily. Dx: E11.29  . hydrALAZINE (APRESOLINE) 25 MG tablet Take 1 tablet (25 mg total) by mouth 2 (two) times daily.  . insulin aspart (NOVOLOG FLEXPEN) 100 UNIT/ML  FlexPen INJECT 5 UNITS SUBCUTANEOUSLYIF BLOOD SUGAR IS ABOVE 250, 10 UNITS IF ABOVE 300.  Marland Kitchen insulin detemir (LEVEMIR) 100 UNIT/ML injection Inject 0.54 mLs (54 Units total) into the skin 2 (two) times daily.  . montelukast (SINGULAIR) 10 MG tablet TAKE ONE TABLET BY MOUTH ONCE DAILY AT BEDTIME  . Multiple Vitamins-Minerals (ONE-A-DAY MENS 50+ ADVANTAGE) TABS Take 1 tablet by mouth daily.  . nitroGLYCERIN (NITROSTAT) 0.4 MG SL tablet Place 1 tablet (0.4 mg total) under the tongue every 5 (five) minutes as needed for chest pain.  Marland Kitchen PROAIR HFA 108 (90 Base) MCG/ACT inhaler INHALE TWO PUFFS BY MOUTH EVERY 6 HOURS AS NEEDED FOR SHORTNESS OF BREATH  . terbinafine (LAMISIL) 1 % cream Apply 1 application topically daily as needed (foot).   . [DISCONTINUED] azithromycin (ZITHROMAX) 250  MG tablet Take 2 tabs po on DAY 1 then take 1 tab po daily x 4 days  . [DISCONTINUED] furosemide (LASIX) 40 MG tablet Take 1 tablet (40 mg total) by mouth 2 (two) times daily.     Allergies: No Known Allergies  Social History: The patient  reports that he quit smoking about 22 months ago. His smoking use included Cigars. He quit after 17.00 years of use. He has never used smokeless tobacco. He reports that he drinks alcohol. He reports that he does not use drugs.   Family History: The patient's family history includes Colon cancer in his paternal uncle; Diabetes in his mother; Heart disease in his father and mother; Hypertension in his father; Kidney disease in his daughter; Stroke in his sister; Sudden death in his daughter.   Review of Systems: Please see the history of present illness.   Otherwise, the review of systems is positive for none.   All other systems are reviewed and negative.   Physical Exam: VS:  BP (!) 160/80 (BP Location: Left Arm, Patient Position: Sitting, Cuff Size: Normal)   Pulse 66   Ht 6\' 1"  (1.854 m)   Wt 238 lb 6.4 oz (108.1 kg)   BMI 31.45 kg/m  .  BMI Body mass index is 31.45  kg/m.  Wt Readings from Last 3 Encounters:  11/07/16 238 lb 6.4 oz (108.1 kg)  09/26/16 222 lb 3.2 oz (100.8 kg)  06/06/16 230 lb 12.8 oz (104.7 kg)    General: Pleasant. Alert and in no acute distress.   HEENT: Normal.  Neck: Supple, no JVD, carotid bruits, or masses noted.  Cardiac: Regular rate and rhythm. No murmurs, rubs, or gallops. Trace edema.  Respiratory:  Lungs with basilar rales but with normal work of breathing.  GI: Soft and nontender.  MS: No deformity or atrophy. Gait and ROM intact.  Skin: Warm and dry. Color is normal.  Neuro:  Strength and sensation are intact and no gross focal deficits noted.  Psych: Alert, appropriate and with normal affect.   LABORATORY DATA:  EKG:  EKG is ordered today. This demonstrates NSR - he has chronic lateral T wave changes but new anteroseptal Q waves noted today.   Lab Results  Component Value Date   WBC 4.4 09/26/2016   HGB 10.1 (L) 09/26/2016   HCT 29.1 (L) 09/26/2016   PLT 284 09/26/2016   GLUCOSE 140 (H) 09/26/2016   CHOL 222 (H) 09/26/2016   TRIG 164 (H) 09/26/2016   HDL 59 09/26/2016   LDLCALC 130 (H) 09/26/2016   ALT 19 09/26/2016   AST 20 09/26/2016   NA 139 09/26/2016   K 4.2 09/26/2016   CL 104 09/26/2016   CREATININE 1.76 (H) 09/26/2016   BUN 29 (H) 09/26/2016   CO2 23 09/26/2016   TSH 0.94 12/19/2015   PSA 0.8 12/19/2015   INR 1.44 02/18/2015   HGBA1C 10.1 (H) 09/26/2016   MICROALBUR CANCELED 09/26/2016     BNP (last 3 results)  Recent Labs  12/29/15 1328 01/03/16 0927 01/09/16 1045  BNP 574.0* 646.1* 199.2*    ProBNP (last 3 results) No results for input(s): PROBNP in the last 8760 hours.   Other Studies Reviewed Today:  Limited Echo Study Conclusions from 12/2015  - Left ventricle: The cavity size was mildly dilated. Wall thickness was increased in a pattern of mild LVH. Systolic function was moderately reduced. The estimated ejection fraction was in the range of 35% to 40%.  Diffuse hypokinesis.  There is akinesis of the mid-apicalinferolateral myocardium. Features are consistent with a pseudonormal left ventricular filling pattern, with concomitant abnormal relaxation and increased filling pressure (grade 2 diastolic dysfunction). - Aortic valve: Trileaflet; mildly thickened, mildly calcified leaflets. - Aorta: Ascending aortic diameter: 40 mm (S). - Ascending aorta: The ascending aorta was mildly dilated. - Mitral valve: There was mild regurgitation. - Left atrium: The atrium was mildly dilated. - Tricuspid valve: There was moderate regurgitation. - Pulmonary arteries: Systolic pressure was mildly increased. PA peak pressure: 42 mm Hg (S).   Echo Study Conclusions from 03/2015  - Left ventricle: The cavity size was normal. Systolic function was mildly reduced. The estimated ejection fraction was in the range of 45% to 50%. Doppler parameters are consistent with abnormal left ventricular relaxation (grade 1 diastolic dysfunction). Mild concentric and moderate focal basal septal hypertrophy. - Regional wall motion abnormality: Mild hypokinesis of the mid anteroseptal, mid inferoseptal, mid inferior, and apical myocardium. - Ventricular septum: Septal motion showed abnormal function and dyssynergy. These changes are consistent with a post-thoracotomy state. - Aortic valve: Moderately calcified annulus. Trileaflet. - Mitral valve: Calcified annulus. Normal thickness leaflets . - Left atrium: The atrium was mildly dilated. - Right ventricle: Systolic function grossly appears to be low normal to mildly reduced.  Assessment/Plan:  1. Acute on chronic combined systolic and diastolic HF - EF is 35 to 40% by echo last November - getting way too much salt - will increase his diuretics. Lab today. Arranging Myoview for abnormal EKG. Salt restriction heavily emphasized today. Need to consider Entresto in the future and ICD  implant if EF has failed to recover.    2. Prior episode of profound anemia - referred back to GI for colonoscopy - apparently negative - may be getting referred to hematology as noted by nephrology note.   3. CAD - prior CABG back in 2017 - no active chest pain. EKG however is abnormal today. Worry about early graft failure. Arranging for Myoview.   4. CKD - followed by nephrology. Back on ACE  5. HTN - BP is up - I suspect due to volume overload - diuretics increased today  6. History of cough - previously not felt to be ACE related - I think his current cough with frothy sputum intermittently is heart failure related.   Current medicines are reviewed with the patient today.  The patient does not have concerns regarding medicines other than what has been noted above.  The following changes have been made:  See above.  Labs/ tests ordered today include:    Orders Placed This Encounter  Procedures  . Basic metabolic panel  . CBC  . Hepatic function panel  . Pro b natriuretic peptide (BNP)  . EKG 12-Lead  . ECHOCARDIOGRAM COMPLETE  . MYOCARDIAL PERFUSION IMAGING     Disposition:   FU with me in about 2 weeks.    Patient is agreeable to this plan and will call if any problems develop in the interim.   SignedTruitt Merle, NP  11/07/2016 3:16 PM  [place] [address] [place], [place]  [address] Phone: [phone] Fax: [phone]

## 2016-11-08 ENCOUNTER — Other Ambulatory Visit: Payer: Self-pay | Admitting: Nurse Practitioner

## 2016-11-08 ENCOUNTER — Ambulatory Visit (HOSPITAL_COMMUNITY): Payer: Medicare Other | Attending: Cardiology

## 2016-11-08 ENCOUNTER — Other Ambulatory Visit: Payer: Self-pay | Admitting: Cardiology

## 2016-11-08 ENCOUNTER — Ambulatory Visit (HOSPITAL_COMMUNITY): Payer: Medicare Other

## 2016-11-08 ENCOUNTER — Encounter (HOSPITAL_COMMUNITY): Payer: Self-pay

## 2016-11-08 ENCOUNTER — Telehealth: Payer: Self-pay | Admitting: *Deleted

## 2016-11-08 VITALS — Ht 73.0 in | Wt 238.0 lb

## 2016-11-08 DIAGNOSIS — I110 Hypertensive heart disease with heart failure: Secondary | ICD-10-CM | POA: Insufficient documentation

## 2016-11-08 DIAGNOSIS — N183 Chronic kidney disease, stage 3 unspecified: Secondary | ICD-10-CM

## 2016-11-08 DIAGNOSIS — E119 Type 2 diabetes mellitus without complications: Secondary | ICD-10-CM | POA: Insufficient documentation

## 2016-11-08 DIAGNOSIS — D631 Anemia in chronic kidney disease: Secondary | ICD-10-CM

## 2016-11-08 DIAGNOSIS — R9431 Abnormal electrocardiogram [ECG] [EKG]: Secondary | ICD-10-CM

## 2016-11-08 DIAGNOSIS — J45909 Unspecified asthma, uncomplicated: Secondary | ICD-10-CM | POA: Insufficient documentation

## 2016-11-08 DIAGNOSIS — I255 Ischemic cardiomyopathy: Secondary | ICD-10-CM | POA: Diagnosis not present

## 2016-11-08 DIAGNOSIS — I10 Essential (primary) hypertension: Secondary | ICD-10-CM | POA: Insufficient documentation

## 2016-11-08 DIAGNOSIS — I25709 Atherosclerosis of coronary artery bypass graft(s), unspecified, with unspecified angina pectoris: Secondary | ICD-10-CM

## 2016-11-08 DIAGNOSIS — N189 Chronic kidney disease, unspecified: Principal | ICD-10-CM

## 2016-11-08 DIAGNOSIS — I5042 Chronic combined systolic (congestive) and diastolic (congestive) heart failure: Secondary | ICD-10-CM | POA: Insufficient documentation

## 2016-11-08 DIAGNOSIS — I5040 Unspecified combined systolic (congestive) and diastolic (congestive) heart failure: Secondary | ICD-10-CM

## 2016-11-08 DIAGNOSIS — I2510 Atherosclerotic heart disease of native coronary artery without angina pectoris: Secondary | ICD-10-CM

## 2016-11-08 LAB — HEPATIC FUNCTION PANEL
ALT: 28 IU/L (ref 0–44)
AST: 38 IU/L (ref 0–40)
Albumin: 3.4 g/dL — ABNORMAL LOW (ref 3.6–4.8)
Alkaline Phosphatase: 74 IU/L (ref 39–117)
Bilirubin Total: <0.2 mg/dL (ref 0.0–1.2)
Bilirubin, Direct: 0.04 mg/dL (ref 0.00–0.40)
Total Protein: 5.7 g/dL — ABNORMAL LOW (ref 6.0–8.5)

## 2016-11-08 LAB — MYOCARDIAL PERFUSION IMAGING
CHL CUP NUCLEAR SDS: 4
CSEPPHR: 69 {beats}/min
LHR: 0.28
LVDIAVOL: 119 mL (ref 62–150)
LVSYSVOL: 199 mL
Rest HR: 59 {beats}/min
SRS: 8
SSS: 12
TID: 1.06

## 2016-11-08 LAB — PRO B NATRIURETIC PEPTIDE: NT-Pro BNP: 1448 pg/mL — ABNORMAL HIGH (ref 0–376)

## 2016-11-08 LAB — BASIC METABOLIC PANEL
BUN/Creatinine Ratio: 18 (ref 10–24)
BUN: 49 mg/dL — ABNORMAL HIGH (ref 8–27)
CO2: 24 mmol/L (ref 20–29)
Calcium: 9 mg/dL (ref 8.6–10.2)
Chloride: 102 mmol/L (ref 96–106)
Creatinine, Ser: 2.69 mg/dL — ABNORMAL HIGH (ref 0.76–1.27)
GFR calc Af Amer: 27 mL/min/{1.73_m2} — ABNORMAL LOW (ref >59)
GFR calc non Af Amer: 23 mL/min/{1.73_m2} — ABNORMAL LOW (ref >59)
Glucose: 162 mg/dL — ABNORMAL HIGH (ref 65–99)
Potassium: 4.8 mmol/L (ref 3.5–5.2)
Sodium: 139 mmol/L (ref 134–144)

## 2016-11-08 LAB — CBC
Hematocrit: 24.8 % — ABNORMAL LOW (ref 37.5–51.0)
Hemoglobin: 8.1 g/dL — ABNORMAL LOW (ref 13.0–17.7)
MCH: 31.5 pg (ref 26.6–33.0)
MCHC: 32.7 g/dL (ref 31.5–35.7)
MCV: 97 fL (ref 79–97)
Platelets: 293 10*3/uL (ref 150–379)
RBC: 2.57 x10E6/uL — CL (ref 4.14–5.80)
RDW: 14.9 % (ref 12.3–15.4)
WBC: 6.1 10*3/uL (ref 3.4–10.8)

## 2016-11-08 MED ORDER — REGADENOSON 0.4 MG/5ML IV SOLN: 0.4000 mg | Freq: Once | INTRAVENOUS | Status: AC

## 2016-11-08 MED ORDER — TECHNETIUM TC 99M TETROFOSMIN IV KIT
32.9000 | PACK | Freq: Once | INTRAVENOUS | Status: AC | PRN
  Administered 2016-11-08: 32.9 via INTRAVENOUS
  Filled 2016-11-08: qty 33

## 2016-11-08 MED ORDER — REGADENOSON 0.4 MG/5ML IV SOLN
0.4000 mg | Freq: Once | INTRAVENOUS | Status: AC
  Administered 2016-11-08: 0.4 mg via INTRAVENOUS

## 2016-11-08 MED ORDER — TECHNETIUM TC 99M TETROFOSMIN IV KIT
10.4000 | PACK | Freq: Once | INTRAVENOUS | Status: AC | PRN
  Administered 2016-11-08: 10.4 via INTRAVENOUS
  Filled 2016-11-08: qty 11

## 2016-11-08 NOTE — Telephone Encounter (Signed)
Advised patient of lab results  Referral placed

## 2016-11-08 NOTE — Telephone Encounter (Signed)
-----   Message from Burtis Junes, NP sent at 11/08/2016 11:30 AM EDT ----- Please call - he has gotten more anemic - this is probably making his heart failure worse - nephrology was considering referral to hematology - can we see if this was done - if not go ahead and refer to hematology - needs BMET and CBC in one week - will repeat again when I see him back in 2 weeks and otherwise - would continue on current regimen as outlined at his visit.

## 2016-11-09 ENCOUNTER — Encounter: Payer: Self-pay | Admitting: Nurse Practitioner

## 2016-11-11 ENCOUNTER — Other Ambulatory Visit: Payer: Self-pay | Admitting: Internal Medicine

## 2016-11-11 DIAGNOSIS — J4521 Mild intermittent asthma with (acute) exacerbation: Secondary | ICD-10-CM

## 2016-11-15 ENCOUNTER — Other Ambulatory Visit: Payer: Self-pay

## 2016-11-15 ENCOUNTER — Other Ambulatory Visit: Payer: Medicare Other

## 2016-11-15 ENCOUNTER — Ambulatory Visit (HOSPITAL_COMMUNITY): Payer: Medicare Other | Attending: Cardiology

## 2016-11-15 DIAGNOSIS — I2510 Atherosclerotic heart disease of native coronary artery without angina pectoris: Secondary | ICD-10-CM | POA: Insufficient documentation

## 2016-11-15 DIAGNOSIS — I255 Ischemic cardiomyopathy: Secondary | ICD-10-CM | POA: Diagnosis not present

## 2016-11-15 DIAGNOSIS — N189 Chronic kidney disease, unspecified: Secondary | ICD-10-CM

## 2016-11-15 DIAGNOSIS — N183 Chronic kidney disease, stage 3 unspecified: Secondary | ICD-10-CM

## 2016-11-15 DIAGNOSIS — I472 Ventricular tachycardia: Secondary | ICD-10-CM | POA: Diagnosis not present

## 2016-11-15 DIAGNOSIS — Z87891 Personal history of nicotine dependence: Secondary | ICD-10-CM | POA: Diagnosis not present

## 2016-11-15 DIAGNOSIS — I130 Hypertensive heart and chronic kidney disease with heart failure and stage 1 through stage 4 chronic kidney disease, or unspecified chronic kidney disease: Secondary | ICD-10-CM | POA: Insufficient documentation

## 2016-11-15 DIAGNOSIS — I509 Heart failure, unspecified: Secondary | ICD-10-CM | POA: Diagnosis not present

## 2016-11-15 DIAGNOSIS — I110 Hypertensive heart disease with heart failure: Secondary | ICD-10-CM

## 2016-11-15 DIAGNOSIS — E1122 Type 2 diabetes mellitus with diabetic chronic kidney disease: Secondary | ICD-10-CM | POA: Insufficient documentation

## 2016-11-15 DIAGNOSIS — E785 Hyperlipidemia, unspecified: Secondary | ICD-10-CM | POA: Diagnosis not present

## 2016-11-15 DIAGNOSIS — I340 Nonrheumatic mitral (valve) insufficiency: Secondary | ICD-10-CM | POA: Diagnosis not present

## 2016-11-15 DIAGNOSIS — D631 Anemia in chronic kidney disease: Secondary | ICD-10-CM | POA: Diagnosis not present

## 2016-11-15 DIAGNOSIS — D649 Anemia, unspecified: Secondary | ICD-10-CM | POA: Insufficient documentation

## 2016-11-15 DIAGNOSIS — I5042 Chronic combined systolic (congestive) and diastolic (congestive) heart failure: Secondary | ICD-10-CM | POA: Insufficient documentation

## 2016-11-15 LAB — CBC WITH DIFFERENTIAL/PLATELET
Basophils Absolute: 0 10*3/uL (ref 0.0–0.2)
Basos: 0 %
EOS (ABSOLUTE): 0.2 10*3/uL (ref 0.0–0.4)
Eos: 4 %
Hematocrit: 24 % — ABNORMAL LOW (ref 37.5–51.0)
Hemoglobin: 8.1 g/dL — ABNORMAL LOW (ref 13.0–17.7)
Immature Grans (Abs): 0 10*3/uL (ref 0.0–0.1)
Immature Granulocytes: 0 %
Lymphocytes Absolute: 1.1 10*3/uL (ref 0.7–3.1)
Lymphs: 25 %
MCH: 32 pg (ref 26.6–33.0)
MCHC: 33.8 g/dL (ref 31.5–35.7)
MCV: 95 fL (ref 79–97)
Monocytes Absolute: 0.6 10*3/uL (ref 0.1–0.9)
Monocytes: 14 %
Neutrophils Absolute: 2.5 10*3/uL (ref 1.4–7.0)
Neutrophils: 57 %
Platelets: 329 10*3/uL (ref 150–379)
RBC: 2.53 x10E6/uL — CL (ref 4.14–5.80)
RDW: 13.3 % (ref 12.3–15.4)
WBC: 4.5 10*3/uL (ref 3.4–10.8)

## 2016-11-15 LAB — BASIC METABOLIC PANEL
BUN/Creatinine Ratio: 22 (ref 10–24)
BUN: 52 mg/dL — ABNORMAL HIGH (ref 8–27)
CO2: 23 mmol/L (ref 20–29)
Calcium: 9.1 mg/dL (ref 8.6–10.2)
Chloride: 106 mmol/L (ref 96–106)
Creatinine, Ser: 2.38 mg/dL — ABNORMAL HIGH (ref 0.76–1.27)
GFR calc Af Amer: 31 mL/min/{1.73_m2} — ABNORMAL LOW (ref >59)
GFR calc non Af Amer: 27 mL/min/{1.73_m2} — ABNORMAL LOW (ref >59)
Glucose: 81 mg/dL (ref 65–99)
Potassium: 5 mmol/L (ref 3.5–5.2)
Sodium: 142 mmol/L (ref 134–144)

## 2016-11-20 ENCOUNTER — Ambulatory Visit (INDEPENDENT_AMBULATORY_CARE_PROVIDER_SITE_OTHER): Payer: Medicare Other | Admitting: Nurse Practitioner

## 2016-11-20 ENCOUNTER — Encounter: Payer: Self-pay | Admitting: Nurse Practitioner

## 2016-11-20 VITALS — BP 158/80 | HR 64 | Ht 73.0 in | Wt 237.8 lb

## 2016-11-20 DIAGNOSIS — N189 Chronic kidney disease, unspecified: Secondary | ICD-10-CM | POA: Diagnosis not present

## 2016-11-20 DIAGNOSIS — I255 Ischemic cardiomyopathy: Secondary | ICD-10-CM

## 2016-11-20 DIAGNOSIS — D631 Anemia in chronic kidney disease: Secondary | ICD-10-CM | POA: Diagnosis not present

## 2016-11-20 DIAGNOSIS — I5042 Chronic combined systolic (congestive) and diastolic (congestive) heart failure: Secondary | ICD-10-CM

## 2016-11-20 DIAGNOSIS — I2589 Other forms of chronic ischemic heart disease: Secondary | ICD-10-CM

## 2016-11-20 DIAGNOSIS — I110 Hypertensive heart disease with heart failure: Secondary | ICD-10-CM | POA: Diagnosis not present

## 2016-11-20 NOTE — Progress Notes (Signed)
CARDIOLOGY OFFICE NOTE  Date:  11/20/2016    [name] Date of Birth: Jul 25, 1950 Medical Record #[id number]  PCP:  Gildardo Cranker, DO  Cardiologist:  [name]  Chief Complaint  Patient presents with  . Cardiomyopathy  . Shortness of Breath  . Congestive Heart Failure  . Coronary Artery Disease    Follow up visit - seen for [name]    History of Present Illness: Henry Carroll is a 66 y.o. male who presents today for a follow up visit.  Seen for [name].   He has known CAD s/p1/6/17 CABG 4 with LIMA to LAD, free right internal mammary graft to ramus, SVG to OM, SVG to PDA. 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 Mild to moderate MR. Prior nuclear stress test was intermediate risk showing a medium-sized severe basal to mid inferior inferolateral perfusion defect. EF on nuclear stress was 30%. Echo from February showed improvement with EF of 45 to 50%.   I saw him back in December. He had been admitted the month before with profound anemia - required transfusion. His anemic seemed out of proportion to his CKD in mine and Dr. Kingsley Plan opinion. I got him back to GI.   He saw [name] in January - still with some shortness of breath with exertion.  Seen about 2 weeks ago as a work in visit - lots of issues. Congested. Frothy sputum. Swelling and weight was up.  Had EKG changes noted. Lots of salt use. Got a Myoview, echo and diuresed him. Remains anemic - has been referred on to hematology. Kidney function continues to decline.   Comes in today. Here alone. Seeing hematology later this week. He says he is feeling better. Not wheezing any more. Weight is only down 1 pound. Less swelling. No more cough. He can lie flat in the bed now. Seeing hematology on Thursday. Seeing GI back later this month - may have to have what sounds like capsule  endoscopy. No chest pain. He feels better overall. Trying to do better about salt restriction - he really liked the hots dogs and sauerkraut.   Myoview with EF of 40% - prior MI. Echo with EF 40 to 45% - improved slightly from prior study.   Past Medical History:  Diagnosis Date  . Anemia   . 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 Asthma   . Chronic combined systolic (congestive) and diastolic (congestive) heart failure ([place])    a. 12/31/14: 2D ECHO: EF 40-45%, HK of inf myocardium, G1DD, mod MR  . CKD (chronic kidney disease), stage III    stage 3 kidney disease  . Coronary artery disease    a. LHC 01/2015 - triple vessel CAD (mod oLM, mLAD, severe mRCA, intermediate branch stenosis, CTO of mCx). Plan CABG 02/2015.  Marland Kitchen GERD (gastroesophageal reflux disease)   . Hyperlipidemia   . Hypertension   . Mitral regurgitation    a. Mild-mod by echo 12/2014.  Marland Kitchen Myocardial infarction [facility])    pt. states per [name] he has in the past  . NSVT (nonsustained ventricular tachycardia) ([place])    a. 9 beats during 01/2015 adm. BB titrated.  . Obesity    a. BMI 33  . Type II diabetes mellitus ([place])     Past Surgical History:  Procedure Laterality Date  . BACK SURGERY    . CARDIAC CATHETERIZATION  N/A 02/09/2015   Procedure: Left Heart Cath and Coronary Angiography;  Surgeon: Burnell Blanks, MD;  Location: [place] [facility] LAB;  Service: Cardiovascular;  Laterality: N/A;  . COLONOSCOPY  2011   Dr. [name]: Ileocecal valve appeared normal, scattered pancolonic diverticulosis, difficult bowel prep making smaller lesions potentially missed. Recommended three-year follow-up colonoscopy.  . COLONOSCOPY  2003   Dr. [name]: Suspicious lesion at the ileocecal valve, multiple biopsies benign, pancolonic diverticulosis.  . COLONOSCOPY N/A 02/15/2016   Procedure: COLONOSCOPY;  Surgeon: Daneil Dolin, MD;  [facility];  Service: Endoscopy;  Laterality: N/A;  12:45 pm  . CORONARY ARTERY BYPASS GRAFT  N/A 02/18/2015   Procedure: CORONARY ARTERY BYPASS GRAFTING (CABG) x  four, using bilateral internal mammary arteries and right leg greater saphenous vein harvested endoscopically;  Surgeon: [name], MD;  Location: [place] OR;  Service: Open Heart Surgery;  Laterality: N/A;  . ESOPHAGOGASTRODUODENOSCOPY N/A 02/15/2016   Procedure: ESOPHAGOGASTRODUODENOSCOPY (EGD);  Surgeon: Daneil Dolin, MD;  [facility];  Service: Endoscopy;  Laterality: N/A;  . HERNIA REPAIR  1696   Umbilical  . LUMBAR [place] SURGERY  2006   L4 & L5  . TEE WITHOUT CARDIOVERSION N/A 02/18/2015   Procedure: TRANSESOPHAGEAL ECHOCARDIOGRAM (TEE);  Surgeon: [name], MD;  Location: [place];  Service: Open Heart Surgery;  Laterality: N/A;  . TONSILLECTOMY  1962     Medications: Current Meds  Medication Sig  . acetaminophen (TYLENOL) 500 MG tablet Take 1,000 mg by mouth 2 (two) times daily as needed for moderate pain.  Marland Kitchen albuterol (PROVENTIL) (2.5 MG/3ML) 0.083% nebulizer solution USE ONE VIAL (3 ML) IN NEBULIZER EVERY 6 HOURS AS NEEDED FOR WHEEZING OR SHORTNESS OF BREATH. Dx:J45.21, R06.2  . amLODipine (NORVASC) 10 MG tablet TAKE 1 TABLET BY MOUTH ONCE DAILY  . aspirin EC 81 MG tablet Take 1 tablet (81 mg total) by mouth daily.  Marland Kitchen atorvastatin (LIPITOR) 40 MG tablet Take 1 tablet (40 mg total) by mouth every evening.  . benazepril (LOTENSIN) 40 MG tablet TAKE ONE TABLET BY MOUTH ONCE DAILY FOR BLOOD PRESSURE  . carvedilol (COREG) 12.5 MG tablet TAKE ONE TABLET BY MOUTH TWICE DAILY WITH A MEAL  . Cholecalciferol ([employer] VITAMIN D3) 1000 UNITS capsule Take 2 capsules (2,000 Units total) by mouth daily.  Marland Kitchen CINNAMON PO Take 1,000 mg by mouth 2 (two) times daily.   . Coenzyme Q10 (CO Q 10 PO) Take 200 mg by mouth 2 (two) times daily.   . fluticasone (FLONASE) 50 MCG/ACT nasal spray Place 2 sprays into both nostrils daily as needed for allergies or rhinitis (SEASONAL ALLERGIES).  . Fluticasone-Salmeterol (ADVAIR DISKUS)  100-50 MCG/DOSE AEPB Inhale 1 puff into the lungs 2 (two) times daily.  . furosemide (LASIX) 40 MG tablet Take 1.5 tablets (60 mg total) by mouth 2 (two) times daily.  Marland Kitchen glucose blood (ONE TOUCH ULTRA TEST) test strip Use one strip to check glucose three times daily. Dx: E11.29  . hydrALAZINE (APRESOLINE) 25 MG tablet Take 1 tablet (25 mg total) by mouth 2 (two) times daily.  . insulin aspart (NOVOLOG FLEXPEN) 100 UNIT/ML FlexPen INJECT 5 UNITS SUBCUTANEOUSLYIF BLOOD SUGAR IS ABOVE 250, 10 UNITS IF ABOVE 300.  Marland Kitchen insulin detemir (LEVEMIR) 100 UNIT/ML injection Inject 0.54 mLs (54 Units total) into the skin 2 (two) times daily.  . montelukast (SINGULAIR) 10 MG tablet TAKE 1 TABLET BY MOUTH ONCE DAILY AT BEDTIME  . Multiple Vitamins-Minerals (ONE-A-DAY MENS 50+  ADVANTAGE) TABS Take 1 tablet by mouth daily.  . nitroGLYCERIN (NITROSTAT) 0.4 MG SL tablet Place 1 tablet (0.4 mg total) under the tongue every 5 (five) minutes as needed for chest pain.  Marland Kitchen PROAIR HFA 108 (90 Base) MCG/ACT inhaler INHALE TWO PUFFS BY MOUTH EVERY 6 HOURS AS NEEDED FOR SHORTNESS OF BREATH  . terbinafine (LAMISIL) 1 % cream Apply 1 application topically daily as needed (foot).      Allergies: No Known Allergies  Social History: The patient  reports that he quit smoking about 22 months ago. His smoking use included Cigars. He quit after 17.00 years of use. He has never used smokeless tobacco. He reports that he drinks alcohol. He reports that he does not use drugs.   Family History: The patient's family history includes Colon cancer in his paternal uncle; Diabetes in his mother; Heart disease in his father and mother; Hypertension in his father; Kidney disease in his daughter; Stroke in his sister; Sudden death in his daughter.   Review of Systems: Please see the history of present illness.   Otherwise, the review of systems is positive for none.   All other systems are reviewed and negative.   Physical Exam: VS:  BP (!)  158/80 (BP Location: Left Arm, Patient Position: Sitting, Cuff Size: Normal)   Pulse 64   Ht 6\' 1"  (1.854 m)   Wt 237 lb 12.8 oz (107.9 kg)   SpO2 100% Comment: at rest  BMI 31.37 kg/m  .  BMI Body mass index is 31.37 kg/m.  Wt Readings from Last 3 Encounters:  11/20/16 237 lb 12.8 oz (107.9 kg)  11/08/16 238 lb (108 kg)  11/08/16 238 lb (108 kg)   BP is 124/70 by me on recheck.  General: Pleasant. Alert and in no acute distress. Still looks fatigued but better than at last visit.   HEENT: Normal.  Neck: Supple, no JVD, carotid bruits, or masses noted.  Cardiac: Regular rate and rhythm. Heart tones are distant. Trace edema today.  Respiratory:  Lungs are a little coarse but with normal work of breathing.  GI: Soft and nontender.  MS: No deformity or atrophy. Gait and ROM intact.  Skin: Warm and dry. Color is normal.  Neuro:  Strength and sensation are intact and no gross focal deficits noted.  Psych: Alert, appropriate and with normal affect.   LABORATORY DATA:  EKG:  EKG is not ordered today.  Lab Results  Component Value Date   WBC 4.5 11/15/2016   HGB 8.1 (L) 11/15/2016   HCT 24.0 (L) 11/15/2016   PLT 329 11/15/2016   GLUCOSE 81 11/15/2016   CHOL 222 (H) 09/26/2016   TRIG 164 (H) 09/26/2016   HDL 59 09/26/2016   LDLCALC 130 (H) 09/26/2016   ALT 28 11/07/2016   AST 38 11/07/2016   NA 142 11/15/2016   K 5.0 11/15/2016   CL 106 11/15/2016   CREATININE 2.38 (H) 11/15/2016   BUN 52 (H) 11/15/2016   CO2 23 11/15/2016   TSH 0.94 12/19/2015   PSA 0.8 12/19/2015   INR 1.44 02/18/2015   HGBA1C 10.1 (H) 09/26/2016   MICROALBUR CANCELED 09/26/2016     BNP (last 3 results)  Recent Labs  12/29/15 1328 01/03/16 0927 01/09/16 1045  BNP 574.0* 646.1* 199.2*    ProBNP (last 3 results)  Recent Labs  11/07/16 1530  PROBNP 1,448*     Other Studies Reviewed Today:  Echo Study Conclusions 11/2016  - Left ventricle: The cavity  size was mildly dilated.  Wall   thickness was normal. Systolic function was mildly to moderately   reduced. The estimated ejection fraction was in the range of 40%   to 45%. There is hypokinesis of the inferolateral and inferior   myocardium. Doppler parameters are consistent with abnormal left   ventricular relaxation (grade 1 diastolic dysfunction). - Ascending aorta: The ascending aorta was mildly dilated. - Mitral valve: Calcified annulus. There was mild regurgitation. - Left atrium: The atrium was mildly dilated. - Right atrium: The atrium was mildly dilated. - Pulmonary arteries: Systolic pressure was mildly increased. PA   peak pressure: 44 mm Hg (S).  Impressions:  - Hypokinesis of the inferior and inferolateral walls with overall   mild to moderate LV dysfunction; mild diastolic dysfunction;   mildly dilated ascending aorta; mild MR; mild biatrial   enlargement; mild TR with mildly elevated pulmonary pressure.   Myoview Study Highlights 10/2016    The left ventricular ejection fraction is moderately decreased (30-44%).  Nuclear stress EF: 40%.  There was no ST segment deviation noted during stress.  Findings consistent with prior myocardial infarction with peri-infarct ischemia.  This is an intermediate risk study.   1. EF 40% with inferolateral hypokinesis.  2. Primarily fixed medium-sized, moderate intensity basal to mid inferolateral and basal inferior perfusion defect.  This is suggestive of prior infarction with mild peri-infarct ischemia.   Intermediate risk study primarily due to low EF.        Limited Echo Study Conclusions from 12/2015  - Left ventricle: The cavity size was mildly dilated. Wall thickness was increased in a pattern of mild LVH. Systolic function was moderately reduced. The estimated ejection fraction was in the range of 35% to 40%. Diffuse hypokinesis. There is akinesis of the mid-apicalinferolateral myocardium. Features are consistent with a  pseudonormal left ventricular filling pattern, with concomitant abnormal relaxation and increased filling pressure (grade 2 diastolic dysfunction). - Aortic valve: Trileaflet; mildly thickened, mildly calcified leaflets. - Aorta: Ascending aortic diameter: 40 mm (S). - Ascending aorta: The ascending aorta was mildly dilated. - Mitral valve: There was mild regurgitation. - Left atrium: The atrium was mildly dilated. - Tricuspid valve: There was moderate regurgitation. - Pulmonary arteries: Systolic pressure was mildly increased. PA peak pressure: 42 mm Hg (S).   Echo Study Conclusions from 03/2015  - Left ventricle: The cavity size was normal. Systolic function was mildly reduced. The estimated ejection fraction was in the range of 45% to 50%. Doppler parameters are consistent with abnormal left ventricular relaxation (grade 1 diastolic dysfunction). Mild concentric and moderate focal basal septal hypertrophy. - Regional wall motion abnormality: Mild hypokinesis of the mid anteroseptal, mid inferoseptal, mid inferior, and apical myocardium. - Ventricular septum: Septal motion showed abnormal function and dyssynergy. These changes are consistent with a post-thoracotomy state. - Aortic valve: Moderately calcified annulus. Trileaflet. - Mitral valve: Calcified annulus. Normal thickness leaflets . - Left atrium: The atrium was mildly dilated. - Right ventricle: Systolic function grossly appears to be low normal to mildly reduced.  Assessment/Plan:  1. Chronic combined systolic and diastolic HF - EF is now at 40 to 45% by most recent echo and 40% by Myoview.  He has improved with additional diuresis. I think some of his symptoms are driven by anemia. Recheck his lab today.  Reminded about salt restriction.   2. Prior episode of profound anemia - referred back to GI for colonoscopy/EGD - apparently negative - blood count is back down. Seeing hematology  later this week. Has planned GI follow up later this month - may need capsule endoscopy. No active bleeding noted. Recheck his labs today - he tells me that he has a family member who has had to have chronic transfusions.   3. CAD - prior CABG back in 2017 - no active chest pain. Myoview reviewed by [name] - prior MI noted with EF 40%  4. CKD - followed by nephrology.Back on ACE  5. HTN - recheck by me is improved.   6. History of cough - previously not felt to be ACE related - I think his current cough with frothy sputum intermittently is heart failure related. Now resolved.   Current medicines are reviewed with the patient today.  The patient does not have concerns regarding medicines other than what has been noted above.  The following changes have been made:  See above.  Labs/ tests ordered today include:    Orders Placed This Encounter  Procedures  . Basic metabolic panel  . CBC     Disposition:   FU with me in about 6 weeks. Will get him back to see Dr.Skains in January.   Patient is agreeable to this plan and will call if any problems develop in the interim.   SignedTruitt Merle, NP  11/20/2016 2:24 PM  [place] [address] [place], [place]  [address] Phone: [phone] Fax: [phone]

## 2016-11-20 NOTE — Patient Instructions (Addendum)
We will be checking the following labs today - BMET and CBC   Medication Instructions:    Continue with your current medicines.   Stay on the increased dose of Lasix    Testing/Procedures To Be Arranged:  N/A  Follow-Up:   See me in about 6 weeks    Other Special Instructions:   N/A    If you need a refill on your cardiac medications before your next appointment, please call your pharmacy.   Call the [place] office at [phone] if you have any questions, problems or concerns.

## 2016-11-21 LAB — CBC
Hematocrit: 26.4 % — ABNORMAL LOW (ref 37.5–51.0)
Hemoglobin: 8.8 g/dL — ABNORMAL LOW (ref 13.0–17.7)
MCH: 31 pg (ref 26.6–33.0)
MCHC: 33.3 g/dL (ref 31.5–35.7)
MCV: 93 fL (ref 79–97)
Platelets: 297 10*3/uL (ref 150–379)
RBC: 2.84 x10E6/uL — ABNORMAL LOW (ref 4.14–5.80)
RDW: 14.1 % (ref 12.3–15.4)
WBC: 3.7 10*3/uL (ref 3.4–10.8)

## 2016-11-21 LAB — BASIC METABOLIC PANEL
BUN/Creatinine Ratio: 21 (ref 10–24)
BUN: 45 mg/dL — ABNORMAL HIGH (ref 8–27)
CO2: 25 mmol/L (ref 20–29)
Calcium: 9 mg/dL (ref 8.6–10.2)
Chloride: 104 mmol/L (ref 96–106)
Creatinine, Ser: 2.1 mg/dL — ABNORMAL HIGH (ref 0.76–1.27)
GFR calc Af Amer: 36 mL/min/{1.73_m2} — ABNORMAL LOW (ref >59)
GFR calc non Af Amer: 31 mL/min/{1.73_m2} — ABNORMAL LOW (ref >59)
Glucose: 141 mg/dL — ABNORMAL HIGH (ref 65–99)
Potassium: 5.2 mmol/L (ref 3.5–5.2)
Sodium: 142 mmol/L (ref 134–144)

## 2016-11-22 ENCOUNTER — Encounter (HOSPITAL_COMMUNITY): Payer: Medicare Other | Attending: Oncology | Admitting: Oncology

## 2016-11-22 ENCOUNTER — Encounter (HOSPITAL_COMMUNITY): Payer: Self-pay

## 2016-11-22 ENCOUNTER — Encounter (HOSPITAL_COMMUNITY): Payer: Medicare Other

## 2016-11-22 VITALS — HR 81 | Temp 98.1°F | Resp 18 | Ht 73.0 in | Wt 239.2 lb

## 2016-11-22 DIAGNOSIS — N189 Chronic kidney disease, unspecified: Secondary | ICD-10-CM

## 2016-11-22 DIAGNOSIS — E611 Iron deficiency: Secondary | ICD-10-CM | POA: Diagnosis not present

## 2016-11-22 DIAGNOSIS — E1122 Type 2 diabetes mellitus with diabetic chronic kidney disease: Secondary | ICD-10-CM | POA: Diagnosis not present

## 2016-11-22 DIAGNOSIS — D631 Anemia in chronic kidney disease: Secondary | ICD-10-CM | POA: Insufficient documentation

## 2016-11-22 DIAGNOSIS — Z7982 Long term (current) use of aspirin: Secondary | ICD-10-CM | POA: Diagnosis not present

## 2016-11-22 DIAGNOSIS — D649 Anemia, unspecified: Secondary | ICD-10-CM | POA: Diagnosis not present

## 2016-11-22 DIAGNOSIS — I130 Hypertensive heart and chronic kidney disease with heart failure and stage 1 through stage 4 chronic kidney disease, or unspecified chronic kidney disease: Secondary | ICD-10-CM | POA: Diagnosis not present

## 2016-11-22 DIAGNOSIS — Z79899 Other long term (current) drug therapy: Secondary | ICD-10-CM | POA: Diagnosis not present

## 2016-11-22 DIAGNOSIS — N183 Chronic kidney disease, stage 3 (moderate): Secondary | ICD-10-CM | POA: Insufficient documentation

## 2016-11-22 DIAGNOSIS — Z794 Long term (current) use of insulin: Secondary | ICD-10-CM | POA: Insufficient documentation

## 2016-11-22 DIAGNOSIS — Z87891 Personal history of nicotine dependence: Secondary | ICD-10-CM | POA: Diagnosis not present

## 2016-11-22 LAB — COMPREHENSIVE METABOLIC PANEL
ALBUMIN: 3.1 g/dL — AB (ref 3.5–5.0)
ALT: 22 U/L (ref 17–63)
ANION GAP: 9 (ref 5–15)
AST: 24 U/L (ref 15–41)
Alkaline Phosphatase: 67 U/L (ref 38–126)
BILIRUBIN TOTAL: 0.5 mg/dL (ref 0.3–1.2)
BUN: 52 mg/dL — ABNORMAL HIGH (ref 6–20)
CHLORIDE: 100 mmol/L — AB (ref 101–111)
CO2: 25 mmol/L (ref 22–32)
Calcium: 8.6 mg/dL — ABNORMAL LOW (ref 8.9–10.3)
Creatinine, Ser: 2.35 mg/dL — ABNORMAL HIGH (ref 0.61–1.24)
GFR calc Af Amer: 31 mL/min — ABNORMAL LOW (ref >60)
GFR, EST NON AFRICAN AMERICAN: 27 mL/min — AB (ref >60)
GLUCOSE: 387 mg/dL — AB (ref 65–99)
POTASSIUM: 4.7 mmol/L (ref 3.5–5.1)
Sodium: 134 mmol/L — ABNORMAL LOW (ref 135–145)
TOTAL PROTEIN: 5.9 g/dL — AB (ref 6.5–8.1)

## 2016-11-22 LAB — CBC WITH DIFFERENTIAL/PLATELET
BASOS ABS: 0 10*3/uL (ref 0.0–0.1)
BASOS PCT: 1 %
EOS ABS: 0.1 10*3/uL (ref 0.0–0.7)
EOS PCT: 3 %
HEMATOCRIT: 25.3 % — AB (ref 39.0–52.0)
Hemoglobin: 8.6 g/dL — ABNORMAL LOW (ref 13.0–17.0)
Lymphocytes Relative: 29 %
Lymphs Abs: 0.9 10*3/uL (ref 0.7–4.0)
MCH: 32.5 pg (ref 26.0–34.0)
MCHC: 34 g/dL (ref 30.0–36.0)
MCV: 95.5 fL (ref 78.0–100.0)
MONO ABS: 0.3 10*3/uL (ref 0.1–1.0)
MONOS PCT: 8 %
NEUTROS ABS: 1.8 10*3/uL (ref 1.7–7.7)
Neutrophils Relative %: 59 %
PLATELETS: 238 10*3/uL (ref 150–400)
RBC: 2.65 MIL/uL — ABNORMAL LOW (ref 4.22–5.81)
RDW: 12.9 % (ref 11.5–15.5)
WBC: 3.1 10*3/uL — ABNORMAL LOW (ref 4.0–10.5)

## 2016-11-22 LAB — FERRITIN: FERRITIN: 21 ng/mL — AB (ref 24–336)

## 2016-11-22 LAB — VITAMIN B12: Vitamin B-12: 1123 pg/mL — ABNORMAL HIGH (ref 180–914)

## 2016-11-22 LAB — IRON AND TIBC
IRON: 45 ug/dL (ref 45–182)
SATURATION RATIOS: 12 % — AB (ref 17.9–39.5)
TIBC: 361 ug/dL (ref 250–450)
UIBC: 316 ug/dL

## 2016-11-22 LAB — FOLATE: Folate: 21.2 ng/mL (ref >5.9)

## 2016-11-22 LAB — RETICULOCYTES
RBC.: 2.65 MIL/uL — ABNORMAL LOW (ref 4.22–5.81)
RETIC COUNT ABSOLUTE: 76.9 10*3/uL (ref 19.0–186.0)
Retic Ct Pct: 2.9 % (ref 0.4–3.1)

## 2016-11-22 LAB — LACTATE DEHYDROGENASE: LDH: 208 U/L — ABNORMAL HIGH (ref 98–192)

## 2016-11-22 NOTE — Progress Notes (Signed)
[facility] Initial Visit:  Patient Care Team: Gildardo Cranker, DO as PCP - General (Internal Medicine) Jerline Pain, MD as Consulting Physician (Cardiology) Fleet Contras, MD as Consulting Physician (Nephrology) Rutherford Guys, MD as Consulting Physician (Ophthalmology) [name], MD as Consulting Physician (Gastroenterology)  CHIEF COMPLAINTS/PURPOSE OF CONSULTATION:  Anemia  HISTORY OF PRESENTING ILLNESS: Henry Carroll 66 y.o. male presents today for evaluation of normocytic anemia. Patient has history of CKD stage III. Most recent lab work from 11/20/2016 demonstrated a WBC 3.7K, hemoglobin 8.8 g/dL, hematocrit 26.4%, platelet count 297K, creatinine 2.1. Review of his lab work from the past year demonstrate that his hemoglobin has ranged in the 8-10 g/dL range. His most recent iron studies from 09/26/2016 demonstrated serum iron 17, TIBC 309, percent saturation 6%, ferritin 43. Patient denies any recent melena, hematochezia, hematuria, hemoptysis. He states that he did receive IV iron infusions in the past, and per records he did receive 2 doses ferriheme in December 2017. Currently not on any oral iron. Otherwise he states that overall she feels well except for some fatigue. He has occasional shortness of breath with exertion. He has swelling in his feet. Otherwise he denies any fevers or chills, dysphagia, chest pain, abdominal pain, nausea vomiting, diarrhea, constipation.  Last GI workup was on 02/15/16. He had a colonoscopy which demonstrated diverticulosis in the sigmoid colon and in the descending colon. One 5 mm polyp at the splenic flexure, removed with a cold snare. Resected and retrieved. The examination was otherwise normal on direct and retroflexion views. No findings in either upper or lower GI tract to explain anemia via a mechanism of GI bleeding. EGD on 02/15/16 demonstrated Normal esophagus. Normal stomach. Normal duodenal bulb and second  portion of the duodenum. Patient states he will be seeing GI in October for a capsule endoscopy.   Review of Systems - Oncology ROS as per HPI otherwise 12 point ROS is negative.  MEDICAL HISTORY: Past Medical History:  Diagnosis Date  . Anemia   . 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 Asthma   . Chronic combined systolic (congestive) and diastolic (congestive) heart failure ([place])    a. 12/31/14: 2D ECHO: EF 40-45%, HK of inf myocardium, G1DD, mod MR  . CKD (chronic kidney disease), stage III (HCC)    stage 3 kidney disease  . Coronary artery disease    a. LHC 01/2015 - triple vessel CAD (mod oLM, mLAD, severe mRCA, intermediate branch stenosis, CTO of mCx). Plan CABG 02/2015.  Marland Kitchen GERD (gastroesophageal reflux disease)   . Hyperlipidemia   . Hypertension   . Mitral regurgitation    a. Mild-mod by echo 12/2014.  Marland Kitchen Myocardial infarction [facility])    pt. states per [name] he has in the past  . NSVT (nonsustained ventricular tachycardia) ([place])    a. 9 beats during 01/2015 adm. BB titrated.  . Obesity    a. BMI 33  . Type II diabetes mellitus ([place])     SURGICAL HISTORY: Past Surgical History:  Procedure Laterality Date  . BACK SURGERY    . CARDIAC CATHETERIZATION N/A 02/09/2015   Procedure: Left Heart Cath and Coronary Angiography;  Surgeon: Burnell Blanks, MD;  Location: [place] [facility] LAB;  Service: Cardiovascular;  Laterality: N/A;  . COLONOSCOPY  2011   Dr. [name]: Ileocecal valve appeared normal, scattered pancolonic diverticulosis, difficult bowel prep making smaller lesions potentially missed. Recommended three-year follow-up colonoscopy.  . COLONOSCOPY  2003  Dr. [name]: Suspicious lesion at the ileocecal valve, multiple biopsies benign, pancolonic diverticulosis.  . COLONOSCOPY N/A 02/15/2016   Procedure: COLONOSCOPY;  Surgeon: Daneil Dolin, MD;  [facility];  Service: Endoscopy;  Laterality: N/A;  12:45 pm  . CORONARY ARTERY BYPASS GRAFT N/A 02/18/2015    Procedure: CORONARY ARTERY BYPASS GRAFTING (CABG) x  four, using bilateral internal mammary arteries and right leg greater saphenous vein harvested endoscopically;  Surgeon: [name], MD;  Location: [place] OR;  Service: Open Heart Surgery;  Laterality: N/A;  . ESOPHAGOGASTRODUODENOSCOPY N/A 02/15/2016   Procedure: ESOPHAGOGASTRODUODENOSCOPY (EGD);  Surgeon: Daneil Dolin, MD;  [facility];  Service: Endoscopy;  Laterality: N/A;  . HERNIA REPAIR  3500   Umbilical  . LUMBAR [place] SURGERY  2006   L4 & L5  . TEE WITHOUT CARDIOVERSION N/A 02/18/2015   Procedure: TRANSESOPHAGEAL ECHOCARDIOGRAM (TEE);  Surgeon: [name], MD;  Location: [place];  Service: Open Heart Surgery;  Laterality: N/A;  . TONSILLECTOMY  1962    SOCIAL HISTORY: Social History   Social History  . Marital status: Married    Spouse name: N/A  . Number of children: N/A  . Years of education: N/A   Occupational History  . Not on file.   Social History Main Topics  . Smoking status: Former Smoker    Years: 17.00    Types: Cigars    Quit date: 12/31/2014  . Smokeless tobacco: Never Used  . Alcohol use 0.0 oz/week     Comment: occasional glass of wine.  . Drug use: No  . Sexual activity: Yes    Birth control/ protection: None   Other Topics Concern  . Not on file   Social History Narrative  . No narrative on file    FAMILY HISTORY Family History  Problem Relation Age of Onset  . Diabetes Mother   . Heart disease Mother        later in life, age >26  . Heart disease Father        heart failure later in life  . Hypertension Father   . Stroke Sister   . Kidney disease Daughter   . Sudden death Daughter   . Colon cancer Paternal Uncle        Passed from colon CA  . Heart attack Neg Hx     ALLERGIES:  has No Known Allergies.  MEDICATIONS:  Current Outpatient Prescriptions  Medication Sig Dispense Refill  . acetaminophen (TYLENOL) 500 MG tablet Take 1,000 mg by mouth 2 (two) times daily  as needed for moderate pain.    Marland Kitchen albuterol (PROVENTIL) (2.5 MG/3ML) 0.083% nebulizer solution USE ONE VIAL (3 ML) IN NEBULIZER EVERY 6 HOURS AS NEEDED FOR WHEEZING OR SHORTNESS OF BREATH. Dx:J45.21, R06.2 300 mL 11  . amLODipine (NORVASC) 10 MG tablet TAKE 1 TABLET BY MOUTH ONCE DAILY 90 tablet 1  . aspirin EC 81 MG tablet Take 1 tablet (81 mg total) by mouth daily. 90 tablet 3  . atorvastatin (LIPITOR) 40 MG tablet Take 1 tablet (40 mg total) by mouth every evening. 90 tablet 1  . benazepril (LOTENSIN) 40 MG tablet TAKE ONE TABLET BY MOUTH ONCE DAILY FOR BLOOD PRESSURE 90 tablet 1  . carvedilol (COREG) 12.5 MG tablet TAKE ONE TABLET BY MOUTH TWICE DAILY WITH A MEAL 60 tablet 8  . Cholecalciferol ([employer] VITAMIN D3) 1000 UNITS capsule Take 2 capsules (2,000 Units total) by mouth daily. 60 capsule 3  .  CINNAMON PO Take 1,000 mg by mouth 2 (two) times daily.     . Coenzyme Q10 (CO Q 10 PO) Take 200 mg by mouth 2 (two) times daily.     . fluticasone (FLONASE) 50 MCG/ACT nasal spray Place 2 sprays into both nostrils daily as needed for allergies or rhinitis (SEASONAL ALLERGIES).    . Fluticasone-Salmeterol (ADVAIR DISKUS) 100-50 MCG/DOSE AEPB Inhale 1 puff into the lungs 2 (two) times daily. 60 each 3  . furosemide (LASIX) 40 MG tablet Take 1.5 tablets (60 mg total) by mouth 2 (two) times daily. 270 tablet 3  . glucose blood (ONE TOUCH ULTRA TEST) test strip Use one strip to check glucose three times daily. Dx: E11.29 100 each 11  . hydrALAZINE (APRESOLINE) 25 MG tablet Take 1 tablet (25 mg total) by mouth 2 (two) times daily. 180 tablet 3  . insulin aspart (NOVOLOG FLEXPEN) 100 UNIT/ML FlexPen INJECT 5 UNITS SUBCUTANEOUSLYIF BLOOD SUGAR IS ABOVE 250, 10 UNITS IF ABOVE 300. 15 pen 3  . insulin detemir (LEVEMIR) 100 UNIT/ML injection Inject 0.54 mLs (54 Units total) into the skin 2 (two) times daily. 30 mL 6  . montelukast (SINGULAIR) 10 MG tablet TAKE 1 TABLET BY MOUTH ONCE DAILY AT BEDTIME 90 tablet 0   . Multiple Vitamins-Minerals (ONE-A-DAY MENS 50+ ADVANTAGE) TABS Take 1 tablet by mouth daily.    . nitroGLYCERIN (NITROSTAT) 0.4 MG SL tablet Place 1 tablet (0.4 mg total) under the tongue every 5 (five) minutes as needed for chest pain. 25 tablet 3  . PROAIR HFA 108 (90 Base) MCG/ACT inhaler INHALE TWO PUFFS BY MOUTH EVERY 6 HOURS AS NEEDED FOR SHORTNESS OF BREATH 27 each 2  . terbinafine (LAMISIL) 1 % cream Apply 1 application topically daily as needed (foot).      No current facility-administered medications for this visit.    Facility-Administered Medications Ordered in Other Visits  Medication Dose Route Frequency Provider Last Rate Last Dose  . regadenoson (LEXISCAN) injection SOLN 0.4 mg  0.4 mg Intravenous Once Jerline Pain, MD        PHYSICAL EXAMINATION:   Vitals:   11/22/16 1344  Pulse: 81  Resp: 18  Temp: 98.1 F (36.7 C)  SpO2: 99%    Filed Weights   11/22/16 1344  Weight: 239 lb 3.2 oz (108.5 kg)     Physical Exam Constitutional: Well-developed, well-nourished, and in no distress.   HENT:  Head: Normocephalic and atraumatic.  Mouth/Throat: No oropharyngeal exudate. Mucosa moist. Eyes: Pupils are equal, round, and reactive to light. Conjunctivae are normal. No scleral icterus.  Neck: Normal range of motion. Neck supple. No JVD present.  Cardiovascular: Normal rate, regular rhythm and normal heart sounds.  Exam reveals no gallop and no friction rub.   No murmur heard. Pulmonary/Chest: Effort normal and breath sounds normal. No respiratory distress. No wheezes.No rales.  Abdominal: Soft. Bowel sounds are normal. No distension. There is no tenderness. There is no guarding.  Musculoskeletal: No tenderness. 2+ bilateral pedal pitting edema. Lymphadenopathy:    No cervical or supraclavicular adenopathy.  Neurological: Alert and oriented to person, place, and time. No cranial nerve deficit.  Skin: Skin is warm and dry. No rash noted. No erythema. No pallor.   Psychiatric: Affect and judgment normal.    LABORATORY DATA: I have personally reviewed the data as listed:  Office Visit on 11/20/2016  Component Date Value Ref Range Status  . Glucose 11/20/2016 141* 65 - 99 mg/dL Final  . BUN  11/20/2016 45* 8 - 27 mg/dL Final  . Creatinine, Ser 11/20/2016 2.10* 0.76 - 1.27 mg/dL Final  . GFR calc non Af Amer 11/20/2016 31* >59 mL/min/1.73 Final  . GFR calc Af Amer 11/20/2016 36* >59 mL/min/1.73 Final  . BUN/Creatinine Ratio 11/20/2016 21  10 - 24 Final  . Sodium 11/20/2016 142  134 - 144 mmol/L Final  . Potassium 11/20/2016 5.2  3.5 - 5.2 mmol/L Final  . Chloride 11/20/2016 104  96 - 106 mmol/L Final  . CO2 11/20/2016 25  20 - 29 mmol/L Final  . Calcium 11/20/2016 9.0  8.6 - 10.2 mg/dL Final  . WBC 11/20/2016 3.7  3.4 - 10.8 x10E3/uL Final  . RBC 11/20/2016 2.84* 4.14 - 5.80 x10E6/uL Final  . Hemoglobin 11/20/2016 8.8* 13.0 - 17.7 g/dL Final  . Hematocrit 11/20/2016 26.4* 37.5 - 51.0 % Final  . MCV 11/20/2016 93  79 - 97 fL Final  . MCH 11/20/2016 31.0  26.6 - 33.0 pg Final  . MCHC 11/20/2016 33.3  31.5 - 35.7 g/dL Final  . RDW 11/20/2016 14.1  12.3 - 15.4 % Final  . Platelets 11/20/2016 297  150 - 379 x10E3/uL Final  Lab on 11/15/2016  Component Date Value Ref Range Status  . WBC 11/15/2016 4.5  3.4 - 10.8 x10E3/uL Final  . RBC 11/15/2016 2.53* 4.14 - 5.80 x10E6/uL Final  . Hemoglobin 11/15/2016 8.1* 13.0 - 17.7 g/dL Final  . Hematocrit 11/15/2016 24.0* 37.5 - 51.0 % Final  . MCV 11/15/2016 95  79 - 97 fL Final  . MCH 11/15/2016 32.0  26.6 - 33.0 pg Final  . MCHC 11/15/2016 33.8  31.5 - 35.7 g/dL Final  . RDW 11/15/2016 13.3  12.3 - 15.4 % Final  . Platelets 11/15/2016 329  150 - 379 x10E3/uL Final  . Neutrophils 11/15/2016 57  Not Estab. % Final  . Lymphs 11/15/2016 25  Not Estab. % Final  . Monocytes 11/15/2016 14  Not Estab. % Final  . Eos 11/15/2016 4  Not Estab. % Final  . Basos 11/15/2016 0  Not Estab. % Final  . Neutrophils  Absolute 11/15/2016 2.5  1.4 - 7.0 x10E3/uL Final  . Lymphocytes Absolute 11/15/2016 1.1  0.7 - 3.1 x10E3/uL Final  . Monocytes Absolute 11/15/2016 0.6  0.1 - 0.9 x10E3/uL Final  . EOS (ABSOLUTE) 11/15/2016 0.2  0.0 - 0.4 x10E3/uL Final  . Basophils Absolute 11/15/2016 0.0  0.0 - 0.2 x10E3/uL Final  . Immature Granulocytes 11/15/2016 0  Not Estab. % Final  . Immature Grans (Abs) 11/15/2016 0.0  0.0 - 0.1 x10E3/uL Final  . Glucose 11/15/2016 81  65 - 99 mg/dL Final  . BUN 11/15/2016 52* 8 - 27 mg/dL Final  . Creatinine, Ser 11/15/2016 2.38* 0.76 - 1.27 mg/dL Final  . GFR calc non Af Amer 11/15/2016 27* >59 mL/min/1.73 Final  . GFR calc Af Amer 11/15/2016 31* >59 mL/min/1.73 Final  . BUN/Creatinine Ratio 11/15/2016 22  10 - 24 Final  . Sodium 11/15/2016 142  134 - 144 mmol/L Final  . Potassium 11/15/2016 5.0  3.5 - 5.2 mmol/L Final  . Chloride 11/15/2016 106  96 - 106 mmol/L Final  . CO2 11/15/2016 23  20 - 29 mmol/L Final  . Calcium 11/15/2016 9.1  8.6 - 10.2 mg/dL Final  Appointment on 11/08/2016  Component Date Value Ref Range Status  . Rest HR 11/08/2016 59  bpm Final  . Rest BP 11/08/2016 136/67  mmHg Final  . Peak HR 11/08/2016  69  bpm Final  . Peak BP 11/08/2016 147/60  mmHg Final  . SSS 11/08/2016 12   Final  . SRS 11/08/2016 8   Final  . SDS 11/08/2016 4   Final  . LHR 11/08/2016 0.28   Final  . TID 11/08/2016 1.06   Final  . LV sys vol 11/08/2016 199  mL Final  . LV dias vol 11/08/2016 119  62 - 150 mL Final  Office Visit on 11/07/2016  Component Date Value Ref Range Status  . Glucose 11/07/2016 162* 65 - 99 mg/dL Final  . BUN 11/07/2016 49* 8 - 27 mg/dL Final  . Creatinine, Ser 11/07/2016 2.69* 0.76 - 1.27 mg/dL Final  . GFR calc non Af Amer 11/07/2016 23* >59 mL/min/1.73 Final  . GFR calc Af Amer 11/07/2016 27* >59 mL/min/1.73 Final  . BUN/Creatinine Ratio 11/07/2016 18  10 - 24 Final  . Sodium 11/07/2016 139  134 - 144 mmol/L Final  . Potassium 11/07/2016 4.8  3.5 -  5.2 mmol/L Final  . Chloride 11/07/2016 102  96 - 106 mmol/L Final  . CO2 11/07/2016 24  20 - 29 mmol/L Final  . Calcium 11/07/2016 9.0  8.6 - 10.2 mg/dL Final  . WBC 11/07/2016 6.1  3.4 - 10.8 x10E3/uL Final  . RBC 11/07/2016 2.57* 4.14 - 5.80 x10E6/uL Final  . Hemoglobin 11/07/2016 8.1* 13.0 - 17.7 g/dL Final  . Hematocrit 11/07/2016 24.8* 37.5 - 51.0 % Final  . MCV 11/07/2016 97  79 - 97 fL Final  . MCH 11/07/2016 31.5  26.6 - 33.0 pg Final  . MCHC 11/07/2016 32.7  31.5 - 35.7 g/dL Final  . RDW 11/07/2016 14.9  12.3 - 15.4 % Final  . Platelets 11/07/2016 293  150 - 379 x10E3/uL Final  . Total Protein 11/07/2016 5.7* 6.0 - 8.5 g/dL Final  . Albumin 11/07/2016 3.4* 3.6 - 4.8 g/dL Final  . Bilirubin Total 11/07/2016 <0.2  0.0 - 1.2 mg/dL Final  . Bilirubin, Direct 11/07/2016 0.04  0.00 - 0.40 mg/dL Final  . Alkaline Phosphatase 11/07/2016 74  39 - 117 IU/L Final  . AST 11/07/2016 38  0 - 40 IU/L Final  . ALT 11/07/2016 28  0 - 44 IU/L Final  . NT-Pro BNP 11/07/2016 1448* 0 - 376 pg/mL Final   Comment: The following cut-points have been suggested for the use of proBNP for the diagnostic evaluation of heart failure (HF) in patients with acute dyspnea: Modality                     Age           Optimal Cut                            (years)            Point ------------------------------------------------------ Diagnosis (rule in HF)        <50            450 pg/mL                           50 - 75            900 pg/mL                               >75  1800 pg/mL Exclusion (rule out HF)  Age independent     300 pg/mL     RADIOGRAPHIC STUDIES: I have personally reviewed the radiological images as listed and agree with the findings in the report  No results found.  ASSESSMENT/PLAN 1. Normocytic anemia- suspect likely due to underlying anemia of chronic renal disease. Suspect he likely has worsening iron deficiency as well.  Plan to perform a full anemia workup with labs  as stated below. If his iron levels are low will plan to give him feraheme.  Once his iron stores are improved with ferritin >100. Then if he is still anemic with hemoglobin below 11, may consider starting him on ESA therapy. Patient states he will be seeing GI in October for a capsule endoscopy. RTC in 2 weeks to review labs and discuss next plan of care.  Orders Placed This Encounter  Procedures  . CBC with Differential    Standing Status:   Future    Standing Expiration Date:   11/22/2017  . Comprehensive metabolic panel    Standing Status:   Future    Standing Expiration Date:   11/22/2017  . Protein electrophoresis, serum    Standing Status:   Future    Standing Expiration Date:   11/22/2017  . Immunofixation electrophoresis    Standing Status:   Future    Standing Expiration Date:   11/22/2017  . Erythropoietin    Standing Status:   Future    Standing Expiration Date:   11/22/2017  . Iron and TIBC    Standing Status:   Future    Standing Expiration Date:   11/22/2017  . Ferritin    Standing Status:   Future    Standing Expiration Date:   11/22/2017  . Vitamin B12    Standing Status:   Future    Standing Expiration Date:   11/22/2017  . Folate    Standing Status:   Future    Standing Expiration Date:   11/22/2017  . Hemoglobinopathy evaluation    Standing Status:   Future    Standing Expiration Date:   11/22/2017  . Lactate dehydrogenase    Standing Status:   Future    Standing Expiration Date:   11/22/2017  . Reticulocytes    Standing Status:   Future    Standing Expiration Date:   11/22/2017  . Haptoglobin    Standing Status:   Future    Standing Expiration Date:   11/22/2017    All questions were answered. The patient knows to call the clinic with any problems, questions or concerns.  This note was electronically signed.    Twana First, MD  11/22/2016 1:56 PM

## 2016-11-22 NOTE — Patient Instructions (Signed)
[facility] at [facility]  Discharge Instructions:  You were seen by DR. Zhou today _______________________________________________________________  Thank you for choosing [facility] at [facility] to provide your oncology and hematology care.  To afford each patient quality time with our providers, please arrive at least 15 minutes before your scheduled appointment.  You need to re-schedule your appointment if you arrive 10 or more minutes late.  We strive to give you quality time with our providers, and arriving late affects you and other patients whose appointments are after yours.  Also, if you no show three or more times for appointments you may be dismissed from the clinic.  Again, thank you for choosing [facility] at [facility]. Our hope is that these requests will allow you access to exceptional care and in a timely manner. _______________________________________________________________  If you have questions after your visit, please contact our office at [phone] between the hours of 8:30 a.m. and 5:00 p.m. Voicemails left after 4:30 p.m. will not be returned until the following business day. _______________________________________________________________  For prescription refill requests, have your pharmacy contact our office. _______________________________________________________________  Recommendations made by the consultant and any test results will be sent to your referring physician. _______________________________________________________________ 

## 2016-11-23 LAB — HEMOGLOBINOPATHY EVALUATION
HGB A2 QUANT: 2 % (ref 1.8–3.2)
HGB F QUANT: 0 % (ref 0.0–2.0)
HGB VARIANT: 0 %
Hgb A: 98 % (ref 96.4–98.8)
Hgb C: 0 %
Hgb S Quant: 0 %

## 2016-11-23 LAB — ERYTHROPOIETIN: ERYTHROPOIETIN: 9.3 m[IU]/mL (ref 2.6–18.5)

## 2016-11-23 LAB — HAPTOGLOBIN: Haptoglobin: 161 mg/dL (ref 34–200)

## 2016-11-26 LAB — PROTEIN ELECTROPHORESIS, SERUM
A/G RATIO SPE: 1 (ref 0.7–1.7)
ALBUMIN ELP: 2.6 g/dL — AB (ref 2.9–4.4)
Alpha-1-Globulin: 0.2 g/dL (ref 0.0–0.4)
Alpha-2-Globulin: 0.8 g/dL (ref 0.4–1.0)
Beta Globulin: 0.8 g/dL (ref 0.7–1.3)
GLOBULIN, TOTAL: 2.6 g/dL (ref 2.2–3.9)
Gamma Globulin: 0.7 g/dL (ref 0.4–1.8)
M-Spike, %: 0.3 g/dL — ABNORMAL HIGH
TOTAL PROTEIN ELP: 5.2 g/dL — AB (ref 6.0–8.5)

## 2016-11-26 LAB — IMMUNOFIXATION ELECTROPHORESIS
IGA: 148 mg/dL (ref 61–437)
IGG (IMMUNOGLOBIN G), SERUM: 703 mg/dL (ref 700–1600)
IGM (IMMUNOGLOBULIN M), SRM: 33 mg/dL (ref 20–172)
Total Protein ELP: 5.6 g/dL — ABNORMAL LOW (ref 6.0–8.5)

## 2016-12-03 ENCOUNTER — Other Ambulatory Visit: Payer: Self-pay | Admitting: Internal Medicine

## 2016-12-06 ENCOUNTER — Ambulatory Visit (INDEPENDENT_AMBULATORY_CARE_PROVIDER_SITE_OTHER): Payer: Medicare Other | Admitting: Gastroenterology

## 2016-12-06 ENCOUNTER — Telehealth: Payer: Self-pay | Admitting: Internal Medicine

## 2016-12-06 ENCOUNTER — Encounter: Payer: Self-pay | Admitting: Gastroenterology

## 2016-12-06 VITALS — BP 120/69 | HR 89 | Temp 97.5°F | Ht 73.0 in | Wt 230.0 lb

## 2016-12-06 DIAGNOSIS — I255 Ischemic cardiomyopathy: Secondary | ICD-10-CM | POA: Diagnosis not present

## 2016-12-06 DIAGNOSIS — D508 Other iron deficiency anemias: Secondary | ICD-10-CM

## 2016-12-06 NOTE — Progress Notes (Signed)
Referring Provider: Gildardo Cranker, DO Primary Care Physician:  Gildardo Cranker, DO Primary GI: Dr. [name]   Chief Complaint  Patient presents with  . Anemia    HPI:   Henry Carroll is a 66 y.o. male presenting today with a history of IDA. TCS/EGD completed at the beginning of this year without obvious findings. EGD Jan 2018 normal. Colonoscopy with diverticulosis in sigmoid and descending colon, a single 5 mm polyps at splenic flexure (tubular adenoma). Hgb has remained in the 8 range recently, with overall trend 8-10 range. Ferritin trending down.   Only tylenol products. 81 mg aspirin. No PPI, no reflux issues. No dysphagia. No abdominal pain. No N/V. No constipation or diarrhea. No overt GI bleeding. Good appetite.   Past Medical History:  Diagnosis Date  . Anemia   . 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 Asthma   . Chronic combined systolic (congestive) and diastolic (congestive) heart failure ([place])    a. 12/31/14: 2D ECHO: EF 40-45%, HK of inf myocardium, G1DD, mod MR  . CKD (chronic kidney disease), stage III (HCC)    stage 3 kidney disease  . Coronary artery disease    a. LHC 01/2015 - triple vessel CAD (mod oLM, mLAD, severe mRCA, intermediate branch stenosis, CTO of mCx). Plan CABG 02/2015.  Marland Kitchen GERD (gastroesophageal reflux disease)   . Hyperlipidemia   . Hypertension   . Mitral regurgitation    a. Mild-mod by echo 12/2014.  Marland Kitchen Myocardial infarction [facility])    pt. states per [name] he has in the past  . NSVT (nonsustained ventricular tachycardia) ([place])    a. 9 beats during 01/2015 adm. BB titrated.  . Obesity    a. BMI 33  . Type II diabetes mellitus ([place])     Past Surgical History:  Procedure Laterality Date  . BACK SURGERY    . CARDIAC CATHETERIZATION N/A 02/09/2015   Procedure: Left Heart Cath and Coronary Angiography;  Surgeon: Burnell Blanks, MD;  Location: [place] CV LAB;  Service: Cardiovascular;  Laterality: N/A;  . COLONOSCOPY  2011   Dr. [name]: Ileocecal valve appeared normal, scattered pancolonic diverticulosis, difficult bowel prep making smaller lesions potentially missed. Recommended three-year follow-up colonoscopy.  . COLONOSCOPY  2003   Dr. [name]: Suspicious lesion at the ileocecal valve, multiple biopsies benign, pancolonic diverticulosis.  . COLONOSCOPY N/A 02/15/2016   Procedure: COLONOSCOPY;  Surgeon: Daneil Dolin, MD;  [facility];  Service: Endoscopy;  Laterality: N/A;  12:45 pm  . CORONARY ARTERY BYPASS GRAFT N/A 02/18/2015   Procedure: CORONARY ARTERY BYPASS GRAFTING (CABG) x  four, using bilateral internal mammary arteries and right leg greater saphenous vein harvested endoscopically;  Surgeon: [name], MD;  Location: [place] OR;  Service: Open Heart Surgery;  Laterality: N/A;  . ESOPHAGOGASTRODUODENOSCOPY N/A 02/15/2016   Procedure: ESOPHAGOGASTRODUODENOSCOPY (EGD);  Surgeon: Daneil Dolin, MD;  [facility];  Service: Endoscopy;  Laterality: N/A;  . HERNIA REPAIR  1324   Umbilical  . LUMBAR [place] SURGERY  2006   L4 & L5  . TEE WITHOUT CARDIOVERSION N/A 02/18/2015   Procedure: TRANSESOPHAGEAL ECHOCARDIOGRAM (TEE);  Surgeon: [name], MD;  Location: [place];  Service: Open Heart Surgery;  Laterality: N/A;  . TONSILLECTOMY  1962    Current Outpatient Prescriptions  Medication Sig Dispense Refill  . acetaminophen (TYLENOL) 500 MG tablet Take 1,000 mg by mouth 2 (two) times daily as needed for moderate pain.    Marland Kitchen  albuterol (PROVENTIL) (2.5 MG/3ML) 0.083% nebulizer solution USE ONE VIAL (3 ML) IN NEBULIZER EVERY 6 HOURS AS NEEDED FOR WHEEZING OR SHORTNESS OF BREATH. Dx:J45.21, R06.2 300 mL 11  . amLODipine (NORVASC) 10 MG tablet TAKE 1 TABLET BY MOUTH ONCE DAILY 90 tablet 1  . aspirin EC 81 MG tablet Take 1 tablet (81 mg total) by mouth daily. 90 tablet 3  . atorvastatin (LIPITOR) 40 MG tablet Take 1 tablet (40 mg total) by mouth every evening. 90 tablet 1  . benazepril (LOTENSIN) 40 MG  tablet TAKE 1 TABLET BY MOUTH ONCE DAILY FOR BLOOD PRESSURE 90 tablet 1  . carvedilol (COREG) 12.5 MG tablet TAKE ONE TABLET BY MOUTH TWICE DAILY WITH A MEAL 60 tablet 8  . Cholecalciferol (CVS VITAMIN D3) 1000 UNITS capsule Take 2 capsules (2,000 Units total) by mouth daily. 60 capsule 3  . CINNAMON PO Take 1,000 mg by mouth 2 (two) times daily.     . Coenzyme Q10 (CO Q 10 PO) Take 200 mg by mouth daily.     . fluticasone (FLONASE) 50 MCG/ACT nasal spray Place 2 sprays into both nostrils daily as needed for allergies or rhinitis (SEASONAL ALLERGIES).    . Fluticasone-Salmeterol (ADVAIR DISKUS) 100-50 MCG/DOSE AEPB Inhale 1 puff into the lungs 2 (two) times daily. 60 each 3  . furosemide (LASIX) 40 MG tablet Take 1.5 tablets (60 mg total) by mouth 2 (two) times daily. 270 tablet 3  . glucose blood (ONE TOUCH ULTRA TEST) test strip Use one strip to check glucose three times daily. Dx: E11.29 100 each 11  . hydrALAZINE (APRESOLINE) 25 MG tablet Take 1 tablet (25 mg total) by mouth 2 (two) times daily. 180 tablet 3  . insulin aspart (NOVOLOG FLEXPEN) 100 UNIT/ML FlexPen INJECT 5 UNITS SUBCUTANEOUSLYIF BLOOD SUGAR IS ABOVE 250, 10 UNITS IF ABOVE 300. 15 pen 3  . insulin detemir (LEVEMIR) 100 UNIT/ML injection Inject 0.54 mLs (54 Units total) into the skin 2 (two) times daily. 30 mL 6  . montelukast (SINGULAIR) 10 MG tablet TAKE 1 TABLET BY MOUTH ONCE DAILY AT BEDTIME 90 tablet 0  . Multiple Vitamins-Minerals (ONE-A-DAY MENS 50+ ADVANTAGE) TABS Take 1 tablet by mouth daily.    . nitroGLYCERIN (NITROSTAT) 0.4 MG SL tablet Place 1 tablet (0.4 mg total) under the tongue every 5 (five) minutes as needed for chest pain. 25 tablet 3  . PROAIR HFA 108 (90 Base) MCG/ACT inhaler INHALE TWO PUFFS BY MOUTH EVERY 6 HOURS AS NEEDED FOR SHORTNESS OF BREATH 27 each 2  . terbinafine (LAMISIL) 1 % cream Apply 1 application topically daily as needed (foot).      No current facility-administered medications for this  visit.    Facility-Administered Medications Ordered in Other Visits  Medication Dose Route Frequency Provider Last Rate Last Dose  . regadenoson (LEXISCAN) injection SOLN 0.4 mg  0.4 mg Intravenous Once Jerline Pain, MD        Allergies as of 12/06/2016  . (No Known Allergies)    Family History  Problem Relation Age of Onset  . Diabetes Mother   . Heart disease Mother        later in life, age >60  . Heart disease Father        heart failure later in life  . Hypertension Father   . Stroke Sister   . Kidney disease Daughter   . Sudden death Daughter   . Colon cancer Paternal Uncle  Passed from colon CA  . Heart attack Neg Hx     Social History   Social History  . Marital status: Married    Spouse name: N/A  . Number of children: N/A  . Years of education: N/A   Social History Main Topics  . Smoking status: Former Smoker    Years: 17.00    Types: Cigars    Quit date: 12/31/2014  . Smokeless tobacco: Never Used  . Alcohol use 0.0 oz/week     Comment: occasional glass of wine.  . Drug use: No  . Sexual activity: Yes    Birth control/ protection: None   Other Topics Concern  . None   Social History Narrative  . None    Review of Systems: Gen: Denies fever, chills, anorexia. Denies fatigue, weakness, weight loss.  CV: Denies chest pain, palpitations, syncope, peripheral edema, and claudication. Resp: Denies dyspnea at rest, cough, wheezing, coughing up blood, and pleurisy. GI: see HPI  Derm: Denies rash, itching, dry skin Psych: Denies depression, anxiety, memory loss, confusion. No homicidal or suicidal ideation.  Heme: Denies bruising, bleeding, and enlarged lymph nodes.  Physical Exam: BP 120/69   Pulse 89   Temp (!) 97.5 F (36.4 C) (Oral)   Ht 6\' 1"  (1.854 m)   Wt 230 lb (104.3 kg)   BMI 30.34 kg/m  General:   Alert and oriented. No distress noted. Pleasant and cooperative.  Head:  Normocephalic and atraumatic. Eyes:  Conjuctiva clear  without scleral icterus. Mouth:  Oral mucosa pink and moist.  Abdomen:  +BS, soft, non-tender and non-distended. No rebound or guarding. No HSM or masses noted. Msk:  Symmetrical without gross deformities. Normal posture. Extremities:  Without edema. Neurologic:  Alert and  oriented x4 Psych:  Alert and cooperative. Normal mood and affect.

## 2016-12-06 NOTE — Patient Instructions (Signed)
We are setting you up for a capsule study that will take pictures of your small intestine.   We will see you in 3 months!

## 2016-12-06 NOTE — Telephone Encounter (Signed)
I spoke with the patient and explained that he already had AWV with nurse on 04/06/16.  Therefore, he will just need to come at 9:45 on 01/09/17 for his CPE with Dr. Eulas Post. Pt understands. VDM (DD)

## 2016-12-07 ENCOUNTER — Encounter: Payer: Self-pay | Admitting: *Deleted

## 2016-12-07 ENCOUNTER — Other Ambulatory Visit: Payer: Self-pay | Admitting: *Deleted

## 2016-12-07 ENCOUNTER — Telehealth: Payer: Self-pay | Admitting: *Deleted

## 2016-12-07 DIAGNOSIS — D508 Other iron deficiency anemias: Secondary | ICD-10-CM

## 2016-12-07 NOTE — Telephone Encounter (Signed)
Called patient. He is scheduled for [facility]ENS capsule study on 01/08/17 at 7:30am. Arrival time 7:00am. I have made patient aware of the appt details. Letter also mailed to patient with instructions.

## 2016-12-10 DIAGNOSIS — D509 Iron deficiency anemia, unspecified: Secondary | ICD-10-CM | POA: Insufficient documentation

## 2016-12-10 NOTE — Progress Notes (Signed)
[facility] [place], [place] [address]   CLINIC:  Medical Oncology/Hematology  PCP:  Gildardo Cranker, DO [place] [id number] [phone]   REASON FOR VISIT:  Follow-up for Anemia   CURRENT THERAPY: Initial work-up    HISTORY OF PRESENT ILLNESS:  (From Dr. Laverle Patter note on 11/22/16)  Henry Carroll 66 y.o. male presents today for evaluation of normocytic anemia. Patient has history of CKD stage III. Most recent lab work from 11/20/2016 demonstrated a WBC 3.7K, hemoglobin 8.8 g/dL, hematocrit 26.4%, platelet count 297K, creatinine 2.1. Review of his lab work from the past year demonstrate that his hemoglobin has ranged in the 8-10 g/dL range. His most recent iron studies from 09/26/2016 demonstrated serum iron 17, TIBC 309, percent saturation 6%, ferritin 43. Patient denies any recent melena, hematochezia, hematuria, hemoptysis. He states that he did receive IV iron infusions in the past, and per records he did receive 2 doses ferriheme in December 2017. Currently not on any oral iron. Otherwise he states that overall she feels well except for some fatigue. He has occasional shortness of breath with exertion. He has swelling in his feet. Otherwise he denies any fevers or chills, dysphagia, chest pain, abdominal pain, nausea vomiting, diarrhea, constipation.  Last GI workup was on 02/15/16. He had a colonoscopy which demonstrated diverticulosis in the sigmoid colon and in the descending colon. One 5 mm polyp at the splenic flexure, removed with a cold snare. Resected and retrieved. The examination was otherwise normal on direct and retroflexion views. No findings in either upper or lower GI tract to explain anemia via a mechanism of GI bleeding. EGD on 02/15/16 demonstrated Normal esophagus. Normal stomach. Normal duodenal bulb and second portion of the duodenum. Patient states he will be seeing GI in October for a capsule  endoscopy.     INTERVAL HISTORY:  Henry Carroll 66 y.o. male returns for routine follow-up for anemia.   Overall, he tells me he has been feeling very well.  He endorses occasional fatigue, but otherwise feels great. Appetite 100%; energy levels 75%.  Denies any frank bleeding episodes including bright red blood in his stool, hematuria, nosebleeds, or gingival bleeding.  Several months ago he reports an episode of dark/tarry stools, which was self-limiting and lasted ~1 day per his report.  He has been evaluated by GI; he has had colonoscopy.  He is scheduled to have capsule endoscopy in a few weeks.    Denies any focal bone pains. Denies any headaches, changes in vision, or dizziness.  Denies any falls. Denies any changes in bowel or bladder; no dysuria or hematuria. "I just go to the bathroom a lot, but that's normal for me."     His PCP is Dr. Eulas Post; he sees her regularly. He has not had his annual flu vaccine yet; he is requesting it today.      REVIEW OF SYSTEMS:  Review of Systems  Constitutional: Negative.  Negative for chills, fatigue and fever.  HENT:  Negative.  Negative for lump/mass and nosebleeds.   Eyes: Negative.   Respiratory: Negative.  Negative for cough and shortness of breath.   Cardiovascular: Negative.  Negative for chest pain and leg swelling.  Gastrointestinal: Negative.  Negative for abdominal pain, blood in stool, constipation, diarrhea, nausea and vomiting.  Endocrine: Negative.   Genitourinary: Negative.  Negative for dysuria and hematuria.   Musculoskeletal: Negative.  Negative for arthralgias.  Skin: Negative.  Negative for rash.  Neurological: Negative.  Negative for dizziness and headaches.  Hematological: Negative.  Negative for adenopathy. Does not bruise/bleed easily.  Psychiatric/Behavioral: Negative.  Negative for depression and sleep disturbance. The patient is not nervous/anxious.      PAST MEDICAL/SURGICAL HISTORY:  Past Medical History:   Diagnosis Date  . Anemia   . 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 Asthma   . Chronic combined systolic (congestive) and diastolic (congestive) heart failure ([place])    a. 12/31/14: 2D ECHO: EF 40-45%, HK of inf myocardium, G1DD, mod MR  . CKD (chronic kidney disease), stage III (HCC)    stage 3 kidney disease  . Coronary artery disease    a. LHC 01/2015 - triple vessel CAD (mod oLM, mLAD, severe mRCA, intermediate branch stenosis, CTO of mCx). Plan CABG 02/2015.  Marland Kitchen GERD (gastroesophageal reflux disease)   . Hyperlipidemia   . Hypertension   . Mitral regurgitation    a. Mild-mod by echo 12/2014.  Marland Kitchen Myocardial infarction [facility])    pt. states per [name] he has in the past  . NSVT (nonsustained ventricular tachycardia) ([place])    a. 9 beats during 01/2015 adm. BB titrated.  . Obesity    a. BMI 33  . Type II diabetes mellitus ([place])    Past Surgical History:  Procedure Laterality Date  . BACK SURGERY    . CARDIAC CATHETERIZATION N/A 02/09/2015   Procedure: Left Heart Cath and Coronary Angiography;  Surgeon: Burnell Blanks, MD;  Location: [place] CV LAB;  Service: Cardiovascular;  Laterality: N/A;  . COLONOSCOPY  2011   Dr. [name]: Ileocecal valve appeared normal, scattered pancolonic diverticulosis, difficult bowel prep making smaller lesions potentially missed. Recommended three-year follow-up colonoscopy.  . COLONOSCOPY  2003   Dr. [name]: Suspicious lesion at the ileocecal valve, multiple biopsies benign, pancolonic diverticulosis.  . COLONOSCOPY N/A 02/15/2016   Procedure: COLONOSCOPY;  Surgeon: Daneil Dolin, MD;  [facility];  Service: Endoscopy;  Laterality: N/A;  12:45 pm  . CORONARY ARTERY BYPASS GRAFT N/A 02/18/2015   Procedure: CORONARY ARTERY BYPASS GRAFTING (CABG) x  four, using bilateral internal mammary arteries and right leg greater saphenous vein harvested endoscopically;  Surgeon: [name], MD;  Location: [place] OR;  Service: Open Heart Surgery;   Laterality: N/A;  . ESOPHAGOGASTRODUODENOSCOPY N/A 02/15/2016   Procedure: ESOPHAGOGASTRODUODENOSCOPY (EGD);  Surgeon: Daneil Dolin, MD;  [facility];  Service: Endoscopy;  Laterality: N/A;  . HERNIA REPAIR  0960   Umbilical  . LUMBAR [place] SURGERY  2006   L4 & L5  . TEE WITHOUT CARDIOVERSION N/A 02/18/2015   Procedure: TRANSESOPHAGEAL ECHOCARDIOGRAM (TEE);  Surgeon: [name], MD;  Location: [place];  Service: Open Heart Surgery;  Laterality: N/A;  . TONSILLECTOMY  1962     SOCIAL HISTORY:  Social History   Social History  . Marital status: Married    Spouse name: N/A  . Number of children: N/A  . Years of education: N/A   Occupational History  . Not on file.   Social History Main Topics  . Smoking status: Former Smoker    Years: 17.00    Types: Cigars    Quit date: 12/31/2014  . Smokeless tobacco: Never Used  . Alcohol use 0.0 oz/week     Comment: occasional glass of wine.  . Drug use: No  . Sexual activity: Yes    Birth control/ protection: None   Other Topics Concern  . Not  on file   Social History Narrative  . No narrative on file    FAMILY HISTORY:  Family History  Problem Relation Age of Onset  . Diabetes Mother   . Heart disease Mother        later in life, age >23  . Heart disease Father        heart failure later in life  . Hypertension Father   . Stroke Sister   . Kidney disease Daughter   . Sudden death Daughter   . Colon cancer Paternal Uncle        Passed from colon CA  . Heart attack Neg Hx     CURRENT MEDICATIONS:  Outpatient Encounter Prescriptions as of 12/11/2016  Medication Sig  . acetaminophen (TYLENOL) 500 MG tablet Take 1,000 mg by mouth 2 (two) times daily as needed for moderate pain.  Marland Kitchen albuterol (PROVENTIL) (2.5 MG/3ML) 0.083% nebulizer solution USE ONE VIAL (3 ML) IN NEBULIZER EVERY 6 HOURS AS NEEDED FOR WHEEZING OR SHORTNESS OF BREATH. Dx:J45.21, R06.2  . amLODipine (NORVASC) 10 MG tablet TAKE 1 TABLET BY MOUTH  ONCE DAILY  . aspirin EC 81 MG tablet Take 1 tablet (81 mg total) by mouth daily.  Marland Kitchen atorvastatin (LIPITOR) 40 MG tablet Take 1 tablet (40 mg total) by mouth every evening.  . benazepril (LOTENSIN) 40 MG tablet TAKE 1 TABLET BY MOUTH ONCE DAILY FOR BLOOD PRESSURE  . carvedilol (COREG) 12.5 MG tablet TAKE ONE TABLET BY MOUTH TWICE DAILY WITH A MEAL  . Cholecalciferol (CVS VITAMIN D3) 1000 UNITS capsule Take 2 capsules (2,000 Units total) by mouth daily.  Marland Kitchen CINNAMON PO Take 1,000 mg by mouth 2 (two) times daily.   . Coenzyme Q10 (CO Q 10 PO) Take 200 mg by mouth daily.   . fluticasone (FLONASE) 50 MCG/ACT nasal spray Place 2 sprays into both nostrils daily as needed for allergies or rhinitis (SEASONAL ALLERGIES).  . Fluticasone-Salmeterol (ADVAIR DISKUS) 100-50 MCG/DOSE AEPB Inhale 1 puff into the lungs 2 (two) times daily.  . furosemide (LASIX) 40 MG tablet Take 1.5 tablets (60 mg total) by mouth 2 (two) times daily.  Marland Kitchen glucose blood (ONE TOUCH ULTRA TEST) test strip Use one strip to check glucose three times daily. Dx: E11.29  . hydrALAZINE (APRESOLINE) 25 MG tablet Take 1 tablet (25 mg total) by mouth 2 (two) times daily.  . insulin aspart (NOVOLOG FLEXPEN) 100 UNIT/ML FlexPen INJECT 5 UNITS SUBCUTANEOUSLYIF BLOOD SUGAR IS ABOVE 250, 10 UNITS IF ABOVE 300.  Marland Kitchen insulin detemir (LEVEMIR) 100 UNIT/ML injection Inject 0.54 mLs (54 Units total) into the skin 2 (two) times daily.  . montelukast (SINGULAIR) 10 MG tablet TAKE 1 TABLET BY MOUTH ONCE DAILY AT BEDTIME  . Multiple Vitamins-Minerals (ONE-A-DAY MENS 50+ ADVANTAGE) TABS Take 1 tablet by mouth daily.  . nitroGLYCERIN (NITROSTAT) 0.4 MG SL tablet Place 1 tablet (0.4 mg total) under the tongue every 5 (five) minutes as needed for chest pain.  Marland Kitchen PROAIR HFA 108 (90 Base) MCG/ACT inhaler INHALE TWO PUFFS BY MOUTH EVERY 6 HOURS AS NEEDED FOR SHORTNESS OF BREATH  . terbinafine (LAMISIL) 1 % cream Apply 1 application topically daily as needed (foot).     Facility-Administered Encounter Medications as of 12/11/2016  Medication  . [COMPLETED] Influenza vac split quadrivalent PF (FLUARIX) injection 0.5 mL  . regadenoson (LEXISCAN) injection SOLN 0.4 mg    ALLERGIES:  No Known Allergies   PHYSICAL EXAM:  ECOG Performance status: 0-1 - Mildly symptomatic; remains completely independent  Vitals:   12/11/16 1153  BP: (!) 147/75  Pulse: 73  Resp: 18  Temp: 97.8 F (36.6 C)  SpO2: 100%   Filed Weights   12/11/16 1153  Weight: 231 lb 11.2 oz (105.1 kg)    Physical Exam  Constitutional: He is oriented to person, place, and time and well-developed, well-nourished, and in no distress.  HENT:  Head: Normocephalic.  Mouth/Throat: Oropharynx is clear and moist. No oropharyngeal exudate.  Eyes: Pupils are equal, round, and reactive to light. Conjunctivae are normal. No scleral icterus.  Neck: Normal range of motion. Neck supple.  Cardiovascular: Normal rate and regular rhythm.   Pulmonary/Chest: Effort normal and breath sounds normal. No respiratory distress. He has no wheezes.  Abdominal: Soft. Bowel sounds are normal. There is no tenderness.  Musculoskeletal: Normal range of motion. He exhibits no edema.  Lymphadenopathy:    He has no cervical adenopathy.       Right: No supraclavicular adenopathy present.       Left: No supraclavicular adenopathy present.  Neurological: He is alert and oriented to person, place, and time. No cranial nerve deficit. Gait normal.  Skin: Skin is warm and dry. No rash noted.  Psychiatric: Mood, memory, affect and judgment normal.  Nursing note and vitals reviewed.    LABORATORY DATA:  I have reviewed the labs as listed.  CBC    Component Value Date/Time   WBC 3.1 (L) 11/22/2016 1400   RBC 2.65 (L) 11/22/2016 1400   RBC 2.65 (L) 11/22/2016 1400   HGB 8.6 (L) 11/22/2016 1400   HGB 8.8 (L) 11/20/2016 1430   HCT 25.3 (L) 11/22/2016 1400   HCT 26.4 (L) 11/20/2016 1430   PLT 238 11/22/2016  1400   PLT 297 11/20/2016 1430   MCV 95.5 11/22/2016 1400   MCV 93 11/20/2016 1430   MCH 32.5 11/22/2016 1400   MCHC 34.0 11/22/2016 1400   RDW 12.9 11/22/2016 1400   RDW 14.1 11/20/2016 1430   LYMPHSABS 0.9 11/22/2016 1400   LYMPHSABS 1.1 11/15/2016 0815   MONOABS 0.3 11/22/2016 1400   EOSABS 0.1 11/22/2016 1400   EOSABS 0.2 11/15/2016 0815   BASOSABS 0.0 11/22/2016 1400   BASOSABS 0.0 11/15/2016 0815   CMP Latest Ref Rng & Units 11/22/2016 11/20/2016 11/15/2016  Glucose 65 - 99 mg/dL 387(H) 141(H) 81  BUN 6 - 20 mg/dL 52(H) 45(H) 52(H)  Creatinine 0.61 - 1.24 mg/dL 2.35(H) 2.10(H) 2.38(H)  Sodium 135 - 145 mmol/L 134(L) 142 142  Potassium 3.5 - 5.1 mmol/L 4.7 5.2 5.0  Chloride 101 - 111 mmol/L 100(L) 104 106  CO2 22 - 32 mmol/L '25 25 23  '$ Calcium 8.9 - 10.3 mg/dL 8.6(L) 9.0 9.1  Total Protein 6.5 - 8.1 g/dL 5.9(L) - -  Total Bilirubin 0.3 - 1.2 mg/dL 0.5 - -  Alkaline Phos 38 - 126 U/L 67 - -  AST 15 - 41 U/L 24 - -  ALT 17 - 63 U/L 22 - -   Results for Henry Carroll, Henry Carroll (MRN [id number])   Ref. Range 11/22/2016 14:00  Iron Latest Ref Range: 45 - 182 ug/dL 45  UIBC Latest Units: ug/dL 316  TIBC Latest Ref Range: 250 - 450 ug/dL 361  Saturation Ratios Latest Ref Range: 17.9 - 39.5 % 12 (L)  Ferritin Latest Ref Range: 24 - 336 ng/mL 21 (L)  Vitamin B12 Latest Ref Range: 180 - 914 pg/mL 1,123 (H)      PENDING LABS:    DIAGNOSTIC IMAGING:  PATHOLOGY:        ASSESSMENT & PLAN:   Anemia:  -Initially thought to be secondary to stage III CKD, with an element of iron deficiency.  -Labs on 11/22/16 revealed the following: Hgb 8.6 g/dL. Ferritin low at 21; iron sat ratios also low at 12%. Folate and vitamin B12 normal. SPEP performed and M-spike 0.3% in the gamma region with lambda light chain specificity. IgG/IgA/IgM normal.  Haptoglobin and erythropoietin normal.  -Discussed with Dr. Talbert Cage. Concern for multiple myeloma given monoclonal protein. When evaluating for  "CRAB" symptoms: calcium is actually low, renal function elevated with creatinine in ~2 range, anemia present, and no focal bone pain. 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 However, per Dr. Laverle Patter recommendations, we will continue to monitor with periodic blood work.   -He is scheduled to have capsule study with GI in November. Will await those results to see if they are able to identify any source of bleeding which may be contributing to his iron deficiency and anemia.  -Return to cancer center in 2.5 months for follow-up with labs.   Iron deficiency:  -Based on recent iron studies, will make arrangements for him to receive 2 doses of IV Injectafer; doses 1 week apart, as soon as patient is able. Calculated iron deficit ~1200.  Treatment plan built.        Dispo:  -Make arrangements for doses of IV Injectafer 1 week apart, as soon as pt is able.  -Return to cancer center in 2.5 months for follow-up with labs.    All questions were answered to patient's stated satisfaction. Encouraged patient to call with any new concerns or questions before his next visit to the cancer center and we can certain see him sooner, if needed.    Plan of care discussed with Dr. Talbert Cage, who agrees with the above aforementioned.    Orders placed this encounter:  Orders Placed This Encounter  Procedures  . CBC with Differential/Platelet  . Comprehensive metabolic panel  . Kappa/lambda light chains  . Immunofixation electrophoresis  . Protein electrophoresis, serum  . Ferritin  . Iron and TIBC      Mike Craze, NP [place] [phone]

## 2016-12-11 ENCOUNTER — Encounter (HOSPITAL_BASED_OUTPATIENT_CLINIC_OR_DEPARTMENT_OTHER): Payer: Medicare Other | Admitting: Adult Health

## 2016-12-11 ENCOUNTER — Encounter (HOSPITAL_COMMUNITY): Payer: Self-pay | Admitting: Adult Health

## 2016-12-11 VITALS — BP 147/75 | HR 73 | Temp 97.8°F | Resp 18 | Wt 231.7 lb

## 2016-12-11 DIAGNOSIS — Z23 Encounter for immunization: Secondary | ICD-10-CM

## 2016-12-11 DIAGNOSIS — D649 Anemia, unspecified: Secondary | ICD-10-CM | POA: Diagnosis not present

## 2016-12-11 DIAGNOSIS — E611 Iron deficiency: Secondary | ICD-10-CM | POA: Diagnosis not present

## 2016-12-11 DIAGNOSIS — N183 Chronic kidney disease, stage 3 (moderate): Secondary | ICD-10-CM | POA: Diagnosis not present

## 2016-12-11 DIAGNOSIS — D509 Iron deficiency anemia, unspecified: Secondary | ICD-10-CM

## 2016-12-11 MED ORDER — INFLUENZA VAC SPLIT QUAD 0.5 ML IM SUSY
0.5000 mL | PREFILLED_SYRINGE | Freq: Once | INTRAMUSCULAR | Status: AC
  Administered 2016-12-11: 0.5 mL via INTRAMUSCULAR

## 2016-12-11 MED ORDER — INFLUENZA VAC SPLIT QUAD 0.5 ML IM SUSY
PREFILLED_SYRINGE | INTRAMUSCULAR | Status: AC
  Filled 2016-12-11: qty 0.5

## 2016-12-11 NOTE — Patient Instructions (Addendum)
[place] at [facility] Discharge Instructions  RECOMMENDATIONS MADE BY THE CONSULTANT AND ANY TEST RESULTS WILL BE SENT TO YOUR REFERRING PHYSICIAN.  You were seen today by Mike Craze, NP You will get your flu shot today We will schedule you for your iron infusions Follow up in 2-3 months with labs See schedulers up front for appointments     Thank you for choosing [place] at [facility] to provide your oncology and hematology care.  To afford each patient quality time with our provider, please arrive at least 15 minutes before your scheduled appointment time.    If you have a lab appointment with the [facility] please come in thru the  Main Entrance and check in at the main information desk  You need to re-schedule your appointment should you arrive 10 or more minutes late.  We strive to give you quality time with our providers, and arriving late affects you and other patients whose appointments are after yours.  Also, if you no show three or more times for appointments you may be dismissed from the clinic at the providers discretion.     Again, thank you for choosing [facility].  Our hope is that these requests will decrease the amount of time that you wait before being seen by our physicians.       _____________________________________________________________  Should you have questions after your visit to [facility], please contact our office at [phone] between the hours of 8:30 a.m. and 4:30 p.m.  Voicemails left after 4:30 p.m. will not be returned until the following business day.  For prescription refill requests, have your pharmacy contact our office.       Resources For Cancer Patients and their Caregivers ? American Cancer Society: Can assist with transportation, wigs, general needs, runs Look Good Feel Better.        575-337-8889 ? Cancer Care: Provides financial assistance,  online support groups, medication/co-pay assistance.  1-800-813-HOPE 916-477-5127) ? [facility] Assists [place] cancer patients and their families through emotional , educational and financial support.  848-290-1136 ? [place] DSS Where to apply for food stamps, Medicaid and utility assistance. 539 708 8662 ? [employer]: Transportation to medical appointments. 463 762 5947 ? Social Security Administration: May apply for disability if have a Stage IV cancer. 7780944190 (919)580-9650 ? [employer], Disability and Transit Services: Assists with nutrition, care and transit needs. [place] Support Programs: @10RELATIVEDAYS @ > Cancer Support Group  2nd Tuesday of the month 1pm-2pm, Journey Room  > Creative Journey  3rd Tuesday of the month 1130am-1pm, Journey Room  > Look Good Feel Better  1st Wednesday of the month 10am-12 noon, Journey Room (Call [place] to register [phone])

## 2016-12-11 NOTE — Progress Notes (Signed)
Henry Carroll presents today for injection per MD orders. Fluarix administered IM in left deltoid. Administration without incident. Patient tolerated well. Patient tolerated treatment without incidence. Patient discharged ambulatory and in stable condition from clinic. Patient to follow up as scheduled.

## 2016-12-12 NOTE — Assessment & Plan Note (Signed)
66 year old male with history of IDA, s/p TCS/EGD without obvious findings, with ferritin trending down. Needs capsule to wrap up GI evaluation, which he is willing to pursue now. Pursue capsule study. Return in 3 months.

## 2016-12-13 ENCOUNTER — Encounter (HOSPITAL_COMMUNITY): Payer: Medicare Other | Attending: Oncology

## 2016-12-13 VITALS — BP 156/68 | HR 70 | Temp 98.0°F | Resp 16

## 2016-12-13 DIAGNOSIS — D509 Iron deficiency anemia, unspecified: Secondary | ICD-10-CM

## 2016-12-13 DIAGNOSIS — D631 Anemia in chronic kidney disease: Secondary | ICD-10-CM | POA: Insufficient documentation

## 2016-12-13 DIAGNOSIS — Z87891 Personal history of nicotine dependence: Secondary | ICD-10-CM | POA: Insufficient documentation

## 2016-12-13 DIAGNOSIS — I130 Hypertensive heart and chronic kidney disease with heart failure and stage 1 through stage 4 chronic kidney disease, or unspecified chronic kidney disease: Secondary | ICD-10-CM | POA: Insufficient documentation

## 2016-12-13 DIAGNOSIS — Z7982 Long term (current) use of aspirin: Secondary | ICD-10-CM | POA: Insufficient documentation

## 2016-12-13 DIAGNOSIS — Z794 Long term (current) use of insulin: Secondary | ICD-10-CM | POA: Insufficient documentation

## 2016-12-13 DIAGNOSIS — N183 Chronic kidney disease, stage 3 (moderate): Secondary | ICD-10-CM | POA: Insufficient documentation

## 2016-12-13 DIAGNOSIS — Z79899 Other long term (current) drug therapy: Secondary | ICD-10-CM | POA: Insufficient documentation

## 2016-12-13 DIAGNOSIS — E1122 Type 2 diabetes mellitus with diabetic chronic kidney disease: Secondary | ICD-10-CM | POA: Insufficient documentation

## 2016-12-13 MED ORDER — SODIUM CHLORIDE 0.9 % IV SOLN
750.0000 mg | Freq: Once | INTRAVENOUS | Status: AC
  Administered 2016-12-13: 750 mg via INTRAVENOUS
  Filled 2016-12-13: qty 15

## 2016-12-13 MED ORDER — SODIUM CHLORIDE 0.9 % IV SOLN
INTRAVENOUS | Status: DC
  Administered 2016-12-13: 12:00:00 via INTRAVENOUS

## 2016-12-13 NOTE — Progress Notes (Signed)
CC'D TO PCP °

## 2016-12-13 NOTE — Progress Notes (Signed)
Tolerated infusion w/o adverse reaction.  Alert, in no distress.  VSS.  Discharged ambulatory.  

## 2016-12-16 ENCOUNTER — Other Ambulatory Visit: Payer: Self-pay | Admitting: Internal Medicine

## 2016-12-18 ENCOUNTER — Ambulatory Visit (HOSPITAL_COMMUNITY): Payer: Medicare Other

## 2016-12-20 ENCOUNTER — Encounter (HOSPITAL_COMMUNITY): Payer: Self-pay

## 2016-12-20 ENCOUNTER — Encounter (HOSPITAL_BASED_OUTPATIENT_CLINIC_OR_DEPARTMENT_OTHER): Payer: Medicare Other

## 2016-12-20 VITALS — BP 150/81 | HR 90 | Temp 97.1°F | Resp 16

## 2016-12-20 DIAGNOSIS — D509 Iron deficiency anemia, unspecified: Secondary | ICD-10-CM

## 2016-12-20 DIAGNOSIS — E611 Iron deficiency: Secondary | ICD-10-CM | POA: Diagnosis not present

## 2016-12-20 MED ORDER — SODIUM CHLORIDE 0.9% FLUSH
10.0000 mL | Freq: Once | INTRAVENOUS | Status: AC
  Administered 2016-12-20: 10 mL via INTRAVENOUS

## 2016-12-20 MED ORDER — SODIUM CHLORIDE 0.9 % IV SOLN
750.0000 mg | Freq: Once | INTRAVENOUS | Status: AC
  Administered 2016-12-20: 750 mg via INTRAVENOUS
  Filled 2016-12-20: qty 15

## 2016-12-20 MED ORDER — SODIUM CHLORIDE 0.9 % IV SOLN
INTRAVENOUS | Status: DC
  Administered 2016-12-20: 14:00:00 via INTRAVENOUS

## 2016-12-20 NOTE — Progress Notes (Signed)
Patient tolerated iron infusion with no complaints voiced.  Good blood return noted before and after administration.  Peripheral IV site clean and dry with no bruising or swelling noted at site.  Band aid applied.  VSS with discharge and left ambulatory with no s/s of distress noted.

## 2016-12-20 NOTE — Patient Instructions (Signed)
[facility] at [facility]  Discharge Instructions:  You received an iron infusion today.  _______________________________________________________________  Thank you for choosing [facility] at [facility] to provide your oncology and hematology care.  To afford each patient quality time with our providers, please arrive at least 15 minutes before your scheduled appointment.  You need to re-schedule your appointment if you arrive 10 or more minutes late.  We strive to give you quality time with our providers, and arriving late affects you and other patients whose appointments are after yours.  Also, if you no show three or more times for appointments you may be dismissed from the clinic.  Again, thank you for choosing [facility] at [facility]. Our hope is that these requests will allow you access to exceptional care and in a timely manner. _______________________________________________________________  If you have questions after your visit, please contact our office at [phone] between the hours of 8:30 a.m. and 5:00 p.m. Voicemails left after 4:30 p.m. will not be returned until the following business day. _______________________________________________________________  For prescription refill requests, have your pharmacy contact our office. _______________________________________________________________  Recommendations made by the consultant and any test results will be sent to your referring physician. _______________________________________________________________ 

## 2016-12-25 ENCOUNTER — Encounter: Payer: Self-pay | Admitting: Nurse Practitioner

## 2016-12-25 ENCOUNTER — Ambulatory Visit (INDEPENDENT_AMBULATORY_CARE_PROVIDER_SITE_OTHER): Payer: Medicare Other | Admitting: Nurse Practitioner

## 2016-12-25 VITALS — BP 130/70 | HR 56 | Ht 73.0 in | Wt 234.0 lb

## 2016-12-25 DIAGNOSIS — I255 Ischemic cardiomyopathy: Secondary | ICD-10-CM | POA: Diagnosis not present

## 2016-12-25 DIAGNOSIS — Z951 Presence of aortocoronary bypass graft: Secondary | ICD-10-CM | POA: Diagnosis not present

## 2016-12-25 DIAGNOSIS — I5042 Chronic combined systolic (congestive) and diastolic (congestive) heart failure: Secondary | ICD-10-CM | POA: Diagnosis not present

## 2016-12-25 DIAGNOSIS — I10 Essential (primary) hypertension: Secondary | ICD-10-CM | POA: Diagnosis not present

## 2016-12-25 DIAGNOSIS — N189 Chronic kidney disease, unspecified: Secondary | ICD-10-CM

## 2016-12-25 DIAGNOSIS — D631 Anemia in chronic kidney disease: Secondary | ICD-10-CM | POA: Diagnosis not present

## 2016-12-25 DIAGNOSIS — I2589 Other forms of chronic ischemic heart disease: Secondary | ICD-10-CM

## 2016-12-25 NOTE — Patient Instructions (Addendum)
We will be checking the following labs today - BMET and CBC  Medication Instructions:    Continue with your current medicines.     Testing/Procedures To Be Arranged:  N/A  Follow-Up:   See [name] in January   Other Special Instructions:   N/A    If you need a refill on your cardiac medications before your next appointment, please call your pharmacy.   Call the [place] office at [phone] if you have any questions, problems or concerns.

## 2016-12-25 NOTE — Progress Notes (Signed)
CARDIOLOGY OFFICE NOTE  Date:  12/25/2016    [name] Date of Birth: 1950-05-14 Medical Record #[id number]  PCP:  Gildardo Cranker, DO  Cardiologist:  [name]    Chief Complaint  Patient presents with  . Coronary Artery Disease  . Congestive Heart Failure  . Hypertension    Follow up visit - seen for [name]    History of Present Illness: Henry Carroll is a 66 y.o. male who presents today for a one month check. Seen for [name].   He has known CAD s/p1/6/17 CABG 4 with LIMA to LAD, free right internal mammary graft to ramus, SVG to OM, SVG to PDA. Has diabetes, hypertension, hyperlipidemia, stage III CKD (Dr. 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 Mild to moderate MR. Prior nuclear stress test was intermediate risk showing a medium-sized severe basal to mid inferior inferolateral perfusion defect. EF on nuclear stress was 30%. Echo from February showed improvement with EF of 45 to 50%.   I saw him back in December. He had been admitted the month before with profound anemia - required transfusion. His anemic seemed out of proportion to his CKD in mine and Dr. Kingsley Plan opinion. I got him back to GI. He saw [name] in January - noted to still have some shortness of breath with exertion.  Seen towards the end of September as a work in - lots of issues - congested. Frothy sputum. Swelling and weight was up.  Had EKG changes noted. Lots of salt use (loved hot dogs/sauerkraut). Got a Myoview, echo and diuresed him. Remained anemic - has been referred on to hematology. Kidney function continues to decline. On follow up last month, he was improved. Was being referred to hematology.   Myoview with EF of 40% - prior MI. Echo with EF 40 to 45% - improved slightly from prior study.   Comes in today. Here alone. He is doing better. Now on iron infusions. Having capsule endoscopy later this  month. So far, no identifiable cause of where he is bleeding from. His weight is stable at home. Very little swelling. Not short of breath. Doing everything pretty much that he wants to do. Playing golf when the weather is better. He continues to care for his wife who has had recent stroke. Tolerating his medicine. No chest pain.   Past Medical History:  Diagnosis Date  . Anemia   . 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 Asthma   . Chronic combined systolic (congestive) and diastolic (congestive) heart failure ([place])    a. 12/31/14: 2D ECHO: EF 40-45%, HK of inf myocardium, G1DD, mod MR  . CKD (chronic kidney disease), stage III (HCC)    stage 3 kidney disease  . Coronary artery disease    a. LHC 01/2015 - triple vessel CAD (mod oLM, mLAD, severe mRCA, intermediate branch stenosis, CTO of mCx). Plan CABG 02/2015.  Marland Kitchen GERD (gastroesophageal reflux disease)   . Hyperlipidemia   . Hypertension   . Mitral regurgitation    a. Mild-mod by echo 12/2014.  Marland Kitchen Myocardial infarction [facility])    pt. states per [name] he has in the past  . NSVT (nonsustained ventricular tachycardia) ([place])    a. 9 beats during 01/2015 adm. BB titrated.  . Obesity    a. BMI 33  . Type II diabetes mellitus ([place])     Past Surgical History:  Procedure Laterality  Date  . BACK SURGERY    . COLONOSCOPY  2011   Dr. [name]: Ileocecal valve appeared normal, scattered pancolonic diverticulosis, difficult bowel prep making smaller lesions potentially missed. Recommended three-year follow-up colonoscopy.  . COLONOSCOPY  2003   Dr. [name]: Suspicious lesion at the ileocecal valve, multiple biopsies benign, pancolonic diverticulosis.  Marland Kitchen HERNIA REPAIR  [id number]   Umbilical  . LUMBAR [place] SURGERY  2006   L4 & L5  . TONSILLECTOMY  1962     Medications: Current Meds  Medication Sig  . acetaminophen (TYLENOL) 500 MG tablet Take 1,000 mg by mouth 2 (two) times daily as needed for moderate pain.  Marland Kitchen albuterol (PROVENTIL) (2.5 MG/3ML)  0.083% nebulizer solution USE ONE VIAL (3 ML) IN NEBULIZER EVERY 6 HOURS AS NEEDED FOR WHEEZING OR SHORTNESS OF BREATH. Dx:J45.21, R06.2  . amLODipine (NORVASC) 10 MG tablet TAKE 1 TABLET BY MOUTH ONCE DAILY  . aspirin EC 81 MG tablet Take 1 tablet (81 mg total) by mouth daily.  Marland Kitchen atorvastatin (LIPITOR) 40 MG tablet Take 1 tablet (40 mg total) by mouth every evening.  . benazepril (LOTENSIN) 40 MG tablet TAKE 1 TABLET BY MOUTH ONCE DAILY FOR BLOOD PRESSURE  . carvedilol (COREG) 12.5 MG tablet TAKE ONE TABLET BY MOUTH TWICE DAILY WITH A MEAL  . Cholecalciferol ([employer] VITAMIN D3) 1000 UNITS capsule Take 2 capsules (2,000 Units total) by mouth daily.  Marland Kitchen CINNAMON PO Take 1,000 mg by mouth 2 (two) times daily.   . Coenzyme Q10 (CO Q 10 PO) Take 200 mg by mouth daily.   . fluticasone (FLONASE) 50 MCG/ACT nasal spray Place 2 sprays into both nostrils daily as needed for allergies or rhinitis (SEASONAL ALLERGIES).  . Fluticasone-Salmeterol (ADVAIR DISKUS) 100-50 MCG/DOSE AEPB Inhale 1 puff into the lungs 2 (two) times daily.  . furosemide (LASIX) 40 MG tablet Take 1.5 tablets (60 mg total) by mouth 2 (two) times daily.  Marland Kitchen glucose blood (ONE TOUCH ULTRA TEST) test strip Use one strip to check glucose three times daily. Dx: E11.29  . hydrALAZINE (APRESOLINE) 25 MG tablet Take 1 tablet (25 mg total) by mouth 2 (two) times daily.  . insulin aspart (NOVOLOG FLEXPEN) 100 UNIT/ML FlexPen INJECT 5 UNITS SUBCUTANEOUSLYIF BLOOD SUGAR IS ABOVE 250, 10 UNITS IF ABOVE 300.  Marland Kitchen insulin detemir (LEVEMIR) 100 UNIT/ML injection Inject 0.54 mLs (54 Units total) into the skin 2 (two) times daily.  . montelukast (SINGULAIR) 10 MG tablet TAKE 1 TABLET BY MOUTH ONCE DAILY AT BEDTIME  . Multiple Vitamins-Minerals (ONE-A-DAY MENS 50+ ADVANTAGE) TABS Take 1 tablet by mouth daily.  . nitroGLYCERIN (NITROSTAT) 0.4 MG SL tablet Place 1 tablet (0.4 mg total) under the tongue every 5 (five) minutes as needed for chest pain.  Marland Kitchen PROAIR HFA  108 (90 Base) MCG/ACT inhaler INHALE 2 PUFFS BY MOUTH EVERY 6 HOURS AS NEEDED FOR SHORTNESS OF BREATH  . terbinafine (LAMISIL) 1 % cream Apply 1 application topically daily as needed (foot).      Allergies: No Known Allergies  Social History: The patient  reports that he quit smoking about 1 years ago. His smoking use included cigars. He quit after 17.00 years of use. he has never used smokeless tobacco. He reports that he drinks alcohol. He reports that he does not use drugs.   Family History: The patient's family history includes Colon cancer in his paternal uncle; Diabetes in his mother; Heart disease in his father and mother; Hypertension in his father; Kidney disease in his  daughter; Stroke in his sister; Sudden death in his daughter.   Review of Systems: Please see the history of present illness.   Otherwise, the review of systems is positive for none.   All other systems are reviewed and negative.   Physical Exam: VS:  BP 130/70 (BP Location: Left Arm, Patient Position: Sitting, Cuff Size: Normal)   Pulse (!) 56   Ht 6\' 1"  (1.854 m)   Wt 234 lb (106.1 kg)   BMI 30.87 kg/m  .  BMI Body mass index is 30.87 kg/m.  Wt Readings from Last 3 Encounters:  12/25/16 234 lb (106.1 kg)  12/11/16 231 lb 11.2 oz (105.1 kg)  12/06/16 230 lb (104.3 kg)    General: Pleasant. He looks better today - he is alert and in no acute distress. Color not as pale to me.   HEENT: Normal.  Neck: Supple, no JVD, carotid bruits, or masses noted.  Cardiac: Regular rate and rhythm. No murmurs, rubs, or gallops. No edema.  Respiratory:  Lungs are clear to auscultation bilaterally with normal work of breathing.  GI: Soft and nontender.  MS: No deformity or atrophy. Gait and ROM intact.  Skin: Warm and dry. Color is normal.  Neuro:  Strength and sensation are intact and no gross focal deficits noted.  Psych: Alert, appropriate and with normal affect.   LABORATORY DATA:  EKG:  EKG is not ordered  today.  Lab Results  Component Value Date   WBC 3.1 (L) 11/22/2016   HGB 8.6 (L) 11/22/2016   HCT 25.3 (L) 11/22/2016   PLT 238 11/22/2016   GLUCOSE 387 (H) 11/22/2016   CHOL 222 (H) 09/26/2016   TRIG 164 (H) 09/26/2016   HDL 59 09/26/2016   LDLCALC 130 (H) 09/26/2016   ALT 22 11/22/2016   AST 24 11/22/2016   NA 134 (L) 11/22/2016   K 4.7 11/22/2016   CL 100 (L) 11/22/2016   CREATININE 2.35 (H) 11/22/2016   BUN 52 (H) 11/22/2016   CO2 25 11/22/2016   TSH 0.94 12/19/2015   PSA 0.8 12/19/2015   INR 1.44 02/18/2015   HGBA1C 10.1 (H) 09/26/2016   MICROALBUR CANCELED 09/26/2016     BNP (last 3 results) Recent Labs    12/29/15 1328 01/03/16 0927 01/09/16 1045  BNP 574.0* 646.1* 199.2*    ProBNP (last 3 results) Recent Labs    11/07/16 1530  PROBNP 1,448*     Other Studies Reviewed Today:  Echo Study Conclusions 11/2016  - Left ventricle: The cavity size was mildly dilated. Wall thickness was normal. Systolic function was mildly to moderately reduced. The estimated ejection fraction was in the range of 40% to 45%. There is hypokinesis of the inferolateral and inferior myocardium. Doppler parameters are consistent with abnormal left ventricular relaxation (grade 1 diastolic dysfunction). - Ascending aorta: The ascending aorta was mildly dilated. - Mitral valve: Calcified annulus. There was mild regurgitation. - Left atrium: The atrium was mildly dilated. - Right atrium: The atrium was mildly dilated. - Pulmonary arteries: Systolic pressure was mildly increased. PA peak pressure: 44 mm Hg (S).  Impressions:  - Hypokinesis of the inferior and inferolateral walls with overall mild to moderate LV dysfunction; mild diastolic dysfunction; mildly dilated ascending aorta; mild MR; mild biatrial enlargement; mild TR with mildly elevated pulmonary pressure.   Myoview Study Highlights 10/2016    The left ventricular ejection fraction is  moderately decreased (30-44%).  Nuclear stress EF: 40%.  There was no ST segment deviation noted  during stress.  Findings consistent with prior myocardial infarction with peri-infarct ischemia.  This is an intermediate risk study.  1. EF 40% with inferolateral hypokinesis.  2. Primarily fixed medium-sized, moderate intensity basal to mid inferolateral and basal inferior perfusion defect. This is suggestive of prior infarction with mild peri-infarct ischemia.   Intermediate risk study primarily due to low EF.      Assessment/Plan:  1. Chronic combined systolic and diastolic HF - EF is now at 40 to 45% by most recent echo and 40% by Myoview.  He improved with additional diuresis. I think some of his symptoms were driven by anemia. Now getting IV iron infusions. Rechecking his lab today.  Reminded about salt restriction.   2. Prior episode of profound anemia - referred back to GI for colonoscopy/EGD - apparently negative - getting capsule endoscopy later this week. No active bleeding noted. Recheck his labs today  3. CAD - prior CABG back in 2017 - no active chest pain. Myoview reviewed by [name] - prior MI noted with EF 40% - we will continue with his current regimen.   4. CKD - followed by nephrology - now sees Dr. Posey Pronto.Back on ACE  5. HTN -BP ok on his current regimen.   6. History of cough - previously not felt to be ACE related - I think his current cough with frothy sputum intermittently was heart failure related. Now resolved.    Current medicines are reviewed with the patient today.  The patient does not have concerns regarding medicines other than what has been noted above.  The following changes have been made:  See above.  Labs/ tests ordered today include:    Orders Placed This Encounter  Procedures  . Basic metabolic panel  . CBC     Disposition:   FU with [name] in January - I will be happy to see back afterwards. Clinically looks much  better today.     Patient is agreeable to this plan and will call if any problems develop in the interim.   SignedTruitt Merle, NP  12/25/2016 3:25 PM  [facility] [address] [place], [place]  [address] Phone: [phone] Fax: [phone]

## 2016-12-26 ENCOUNTER — Ambulatory Visit (HOSPITAL_COMMUNITY): Payer: Medicare Other

## 2016-12-26 LAB — CBC
Hematocrit: 30.1 % — ABNORMAL LOW (ref 37.5–51.0)
Hemoglobin: 9.8 g/dL — ABNORMAL LOW (ref 13.0–17.7)
MCH: 31.1 pg (ref 26.6–33.0)
MCHC: 32.6 g/dL (ref 31.5–35.7)
MCV: 96 fL (ref 79–97)
Platelets: 283 10*3/uL (ref 150–379)
RBC: 3.15 x10E6/uL — ABNORMAL LOW (ref 4.14–5.80)
RDW: 13.8 % (ref 12.3–15.4)
WBC: 4 10*3/uL (ref 3.4–10.8)

## 2016-12-26 LAB — BASIC METABOLIC PANEL
BUN/Creatinine Ratio: 18 (ref 10–24)
BUN: 45 mg/dL — ABNORMAL HIGH (ref 8–27)
CO2: 25 mmol/L (ref 20–29)
Calcium: 9 mg/dL (ref 8.6–10.2)
Chloride: 108 mmol/L — ABNORMAL HIGH (ref 96–106)
Creatinine, Ser: 2.51 mg/dL — ABNORMAL HIGH (ref 0.76–1.27)
GFR calc Af Amer: 29 mL/min/{1.73_m2} — ABNORMAL LOW (ref >59)
GFR calc non Af Amer: 25 mL/min/{1.73_m2} — ABNORMAL LOW (ref >59)
Glucose: 47 mg/dL — ABNORMAL LOW (ref 65–99)
Potassium: 5.2 mmol/L (ref 3.5–5.2)
Sodium: 146 mmol/L — ABNORMAL HIGH (ref 134–144)

## 2017-01-08 ENCOUNTER — Encounter (HOSPITAL_COMMUNITY)
Admission: RE | Disposition: A | Discharge status: Home / Self Care | Payer: Self-pay | Source: Ambulatory Visit | Attending: Internal Medicine

## 2017-01-08 ENCOUNTER — Ambulatory Visit (HOSPITAL_COMMUNITY)
Admission: RE | Admit: 2017-01-08 | Discharge: 2017-01-08 | Disposition: A | Discharge status: Home / Self Care | Payer: Medicare Other | Source: Ambulatory Visit | Attending: Internal Medicine | Admitting: Internal Medicine

## 2017-01-08 DIAGNOSIS — D509 Iron deficiency anemia, unspecified: Secondary | ICD-10-CM | POA: Diagnosis not present

## 2017-01-08 DIAGNOSIS — K639 Disease of intestine, unspecified: Secondary | ICD-10-CM | POA: Diagnosis not present

## 2017-01-08 DIAGNOSIS — Z86010 Personal history of colonic polyps: Secondary | ICD-10-CM | POA: Diagnosis not present

## 2017-01-08 DIAGNOSIS — Z7982 Long term (current) use of aspirin: Secondary | ICD-10-CM | POA: Insufficient documentation

## 2017-01-08 DIAGNOSIS — K259 Gastric ulcer, unspecified as acute or chronic, without hemorrhage or perforation: Secondary | ICD-10-CM | POA: Diagnosis not present

## 2017-01-08 HISTORY — PX: GIVENS CAPSULE STUDY: SHX5432

## 2017-01-08 SURGERY — IMAGING PROCEDURE, GI TRACT, INTRALUMINAL, VIA CAPSULE

## 2017-01-09 ENCOUNTER — Ambulatory Visit (INDEPENDENT_AMBULATORY_CARE_PROVIDER_SITE_OTHER): Payer: Medicare Other | Admitting: Internal Medicine

## 2017-01-09 ENCOUNTER — Encounter: Payer: Self-pay | Admitting: Internal Medicine

## 2017-01-09 ENCOUNTER — Other Ambulatory Visit: Payer: Self-pay

## 2017-01-09 ENCOUNTER — Ambulatory Visit: Payer: Federal, State, Local not specified - PPO

## 2017-01-09 VITALS — BP 110/70 | HR 62 | Temp 97.5°F | Ht 73.0 in | Wt 227.6 lb

## 2017-01-09 DIAGNOSIS — E1129 Type 2 diabetes mellitus with other diabetic kidney complication: Secondary | ICD-10-CM

## 2017-01-09 DIAGNOSIS — N183 Chronic kidney disease, stage 3 (moderate): Secondary | ICD-10-CM | POA: Diagnosis not present

## 2017-01-09 DIAGNOSIS — I739 Peripheral vascular disease, unspecified: Secondary | ICD-10-CM | POA: Diagnosis not present

## 2017-01-09 DIAGNOSIS — E785 Hyperlipidemia, unspecified: Secondary | ICD-10-CM

## 2017-01-09 DIAGNOSIS — E1165 Type 2 diabetes mellitus with hyperglycemia: Principal | ICD-10-CM

## 2017-01-09 DIAGNOSIS — I5022 Chronic systolic (congestive) heart failure: Secondary | ICD-10-CM

## 2017-01-09 DIAGNOSIS — Z0000 Encounter for general adult medical examination without abnormal findings: Secondary | ICD-10-CM | POA: Diagnosis not present

## 2017-01-09 DIAGNOSIS — E1121 Type 2 diabetes mellitus with diabetic nephropathy: Secondary | ICD-10-CM

## 2017-01-09 DIAGNOSIS — IMO0002 Reserved for concepts with insufficient information to code with codable children: Secondary | ICD-10-CM

## 2017-01-09 DIAGNOSIS — J4520 Mild intermittent asthma, uncomplicated: Secondary | ICD-10-CM

## 2017-01-09 DIAGNOSIS — D631 Anemia in chronic kidney disease: Secondary | ICD-10-CM

## 2017-01-09 LAB — LIPID PANEL
CHOL/HDL RATIO: 4.4 (calc) (ref <5.0)
Cholesterol: 195 mg/dL (ref <200)
HDL: 44 mg/dL (ref >40)
LDL Cholesterol (Calc): 122 mg/dL (calc) — ABNORMAL HIGH
NON-HDL CHOLESTEROL (CALC): 151 mg/dL — AB (ref <130)
Triglycerides: 177 mg/dL — ABNORMAL HIGH (ref <150)

## 2017-01-09 NOTE — Progress Notes (Signed)
Patient ID: Henry Carroll, male   DOB: 1950/06/22, 66 y.o.   MRN: [id number]   Location:  [facility]  Place of Service:  OFFICE  Provider: Arletha Grippe, DO  Patient Care Team: Gildardo Cranker, DO as PCP - General (Internal Medicine) Jerline Pain, MD as Consulting Physician (Cardiology) Fleet Contras, MD as Consulting Physician (Nephrology) Rutherford Guys, MD as Consulting Physician (Ophthalmology) [name], Cristopher Estimable, MD as Consulting Physician (Gastroenterology)  Extended Emergency Contact Information Primary Emergency Contact: [facility] Address: [address]          [place], [place] [address] Johnnette Litter of [place] Phone: [phone] Mobile Phone: [phone] Relation: Spouse Secondary Emergency Contact: Andria Rhein [place] of [place] Mobile Phone: [phone] Relation: Daughter  Code Status:  FULL CODE Goals of Care: Advanced Directive information Advanced Directives 12/20/2016  Does Patient Have a Medical Advance Directive? No  Type of Advance Directive Living will  Does patient want to make changes to medical advance directive? No - Patient declined  Copy of [place] in Chart? -  Would patient like information on creating a medical advance directive? No - Patient declined     Chief Complaint  Patient presents with  . Annual Exam    Yearly Physical  . MMSE    30/30 passed clock drawing    HPI: Patient is a 66 y.o. male seen in today for an annual wellness exam. He swallowed capsule for capsule endoscopy yesterday. He had x2 iron infusions. Fatigue improved. No bloody stools. Cardio increased lasix dose which has helped swelling and congestion.  He is c/a wife who recently had a stroke and is in rehab  multivessel CAD - stable. He underwent CABG x 4 vessels by Dr Charlott Rakes on 02/18/15. Vein harvested from right leg. ABIs 0.62 on right and 0.28 on left.  Followed by cardio Dr Marlou Porch. Released from CT surgery. Completed cardiac  rehab in July 2017.  Chronic systolic CHF - weight 633 lbs today.. Followed by cardio Dr Marlou Porch. 2D echo revealed EF 35-40% with diffuse hypokinesis and grade 2 DD; mild MR/mild dilated LA; mod TR. He is NYHA class 2. Cardio never started [employer]. Return appt scheduled for 1 yr  DM - BS improved at home.  No low BS reactions. He has not needed novolog. No numbness or tingling. Takes prn novolog and levemir. Uses 54 units levemir BID most days. A1c 10.1%; urine microalbumin/Cr ratio 1202; LDL 130  HTN - stable on lasix, coreg, hydralazine, amlodipine, lotensin. Takes ASA daily. Followed by cardio Dr Marlou Porch  Hyperlipidemia - stable on statin. No myalgias. LDL 130  Asthma - stable on advair and albuterol neb. Takes singulair  CKD - stage 4. followed by nephrology Dr Mercy Moore. Cr 2.51  AOCD - Hgb 9.8. In the past, he has been transfused with PRBCs and rec'd iron infusions. Ferritin 21; iron 45. Followed by hematology Dr Talbert Cage  Last albumin 3.2; total protein 5.7  Depression screen [facility] 2/9 01/09/2017 04/06/2016 12/21/2015 12/05/2015 09/14/2015  Decreased Interest 0 0 0 0 0  Down, Depressed, Hopeless 0 0 0 0 0  PHQ - 2 Score 0 0 0 0 0  Altered sleeping - - - - -  Tired, decreased energy - - - - -  Change in appetite - - - - -  Feeling bad or failure about yourself  - - - - -  Trouble concentrating - - - - -  Moving slowly or fidgety/restless - - - - -  Suicidal thoughts - - - - -  PHQ-9 Score - - - - -  Difficult doing work/chores - - - - -    Fall Risk  01/09/2017 09/26/2016 05/28/2016 04/06/2016 03/09/2016  Falls in the past year? No No No No No  Number falls in past yr: - - - - -  Injury with Fall? - - - - -  Risk for fall due to : - - - - -  Risk for fall due to: Comment - - - - -   MMSE - Mini Mental State Exam 01/09/2017 04/06/2016 12/21/2015 12/01/2014 05/05/2013  Not completed: - (No Data) - - -  Orientation to time 5 - 5 5 4   Orientation to Place 5 - 5 5 5   Registration 3 - 3 3 3    Attention/ Calculation 5 - 5 5 5   Recall 3 - 1 1 2   Language- name 2 objects 2 - 2 2 2   Language- repeat 1 - 1 1 1   Language- follow 3 step command 3 - 3 3 3   Language- read & follow direction 1 - 1 1 1   Write a sentence 1 - 1 1 1   Copy design 1 - 1 1 1   Total score 30 - 28 28 28      Health Maintenance  Topic Date Due  . Hepatitis C Screening  02/12/2018 (Originally Dec 12, 1950)  . HEMOGLOBIN A1C  03/29/2017  . OPHTHALMOLOGY EXAM  06/19/2017  . TETANUS/TDAP  03/25/2021  . COLONOSCOPY  02/14/2026  . INFLUENZA VACCINE  Completed  . PNA vac Low Risk Adult  Completed    Urinary incontinence? No issues  Functional Status Survey: Is the patient deaf or have difficulty hearing?: No Does the patient have difficulty seeing, even when wearing glasses/contacts?: No Does the patient have difficulty concentrating, remembering, or making decisions?: No Does the patient have difficulty walking or climbing stairs?: No Does the patient have difficulty dressing or bathing?: No Does the patient have difficulty doing errands alone such as visiting a doctor's office or shopping?: No  Exercise? He has a regular routine - treadmill 3 times per week x 30 min  Diet? Mostly healthy food choices  No exam data present  Hearing: no issues. No tinnitus    Dentition: he has a dentist and gets regular check ups. Last cleaning in June 2018  Pain: no issues  Past Medical History:  Diagnosis Date  . Anemia   . 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 Asthma   . Chronic combined systolic (congestive) and diastolic (congestive) heart failure ([place])    a. 12/31/14: 2D ECHO: EF 40-45%, HK of inf myocardium, G1DD, mod MR  . CKD (chronic kidney disease), stage III (HCC)    stage 3 kidney disease  . Coronary artery disease    a. LHC 01/2015 - triple vessel CAD (mod oLM, mLAD, severe mRCA, intermediate branch stenosis, CTO of mCx). Plan CABG 02/2015.  Marland Kitchen GERD (gastroesophageal reflux disease)   . Hyperlipidemia    . Hypertension   . Mitral regurgitation    a. Mild-mod by echo 12/2014.  Marland Kitchen Myocardial infarction [facility])    pt. states per [name] he has in the past  . NSVT (nonsustained ventricular tachycardia) ([place])    a. 9 beats during 01/2015 adm. BB titrated.  . Obesity    a. BMI 33  . Type II diabetes mellitus ([place])     Past Surgical History:  Procedure Laterality Date  . BACK SURGERY    .  CARDIAC CATHETERIZATION N/A 02/09/2015   Procedure: Left Heart Cath and Coronary Angiography;  Surgeon: Burnell Blanks, MD;  Location: [place] CV LAB;  Service: Cardiovascular;  Laterality: N/A;  . COLONOSCOPY  2011   Dr. [name]: Ileocecal valve appeared normal, scattered pancolonic diverticulosis, difficult bowel prep making smaller lesions potentially missed. Recommended three-year follow-up colonoscopy.  . COLONOSCOPY  2003   Dr. [name]: Suspicious lesion at the ileocecal valve, multiple biopsies benign, pancolonic diverticulosis.  . COLONOSCOPY N/A 02/15/2016   Procedure: COLONOSCOPY;  Surgeon: Daneil Dolin, MD;  [facility];  Service: Endoscopy;  Laterality: N/A;  12:45 pm  . CORONARY ARTERY BYPASS GRAFT N/A 02/18/2015   Procedure: CORONARY ARTERY BYPASS GRAFTING (CABG) x  four, using bilateral internal mammary arteries and right leg greater saphenous vein harvested endoscopically;  Surgeon: [name], MD;  Location: [place] OR;  Service: Open Heart Surgery;  Laterality: N/A;  . ESOPHAGOGASTRODUODENOSCOPY N/A 02/15/2016   Procedure: ESOPHAGOGASTRODUODENOSCOPY (EGD);  Surgeon: Daneil Dolin, MD;  [facility];  Service: Endoscopy;  Laterality: N/A;  . HERNIA REPAIR  5009   Umbilical  . LUMBAR [place] SURGERY  2006   L4 & L5  . TEE WITHOUT CARDIOVERSION N/A 02/18/2015   Procedure: TRANSESOPHAGEAL ECHOCARDIOGRAM (TEE);  Surgeon: [name], MD;  Location: [place];  Service: Open Heart Surgery;  Laterality: N/A;  . TONSILLECTOMY  1962    Family History  Problem Relation  Age of Onset  . Diabetes Mother   . Heart disease Mother        later in life, age >42  . Heart disease Father        heart failure later in life  . Hypertension Father   . Stroke Sister   . Kidney disease Daughter   . Sudden death Daughter   . Colon cancer Paternal Uncle        Passed from colon CA  . Heart attack Neg Hx    Family Status  Relation Name Status  . Mother  Deceased at age 8       Cause of Death: Complications of diabetes  . Father  Deceased at age 9       Cause of Death: Heart disease  . Sister Stanton Kidney Deceased at age 49       Cause of Death: CVA  . Brother [employer]  . Daughter [employer]  . Sister Scientific laboratory technician  . MGM  Deceased  . MGF  Deceased  . PGM  Deceased  . PGF  Deceased  . Daughter  (Not Specified)  . Daughter  (Not Specified)  . Annamarie Major  Deceased  . Neg Hx  (Not Specified)    shoulder  Social History   Socioeconomic History  . Marital status: Married    Spouse name: Not on file  . Number of children: Not on file  . Years of education: Not on file  . Highest education level: Not on file  Social Needs  . Financial resource strain: Not on file  . Food insecurity - worry: Not on file  . Food insecurity - inability: Not on file  . Transportation needs - medical: Not on file  . Transportation needs - non-medical: Not on file  Occupational History  . Not on file  Tobacco Use  . Smoking status: Former Smoker    Years: 17.00    Types: Cigars    Last attempt to quit: 12/31/2014    Years since quitting:  2.0  . Smokeless tobacco: Never Used  Substance and Sexual Activity  . Alcohol use: Yes    Alcohol/week: 0.0 oz    Comment: occasional glass of wine.  . Drug use: No  . Sexual activity: Yes    Birth control/protection: None  Other Topics Concern  . Not on file  Social History Narrative  . Not on file    No Known Allergies  Allergies as of 01/09/2017   No Known Allergies     Medication List        Accurate as of  01/09/17 11:04 AM. Always use your most recent med list.          acetaminophen 500 MG tablet Commonly known as:  TYLENOL Take 1,000 mg by mouth 2 (two) times daily as needed for moderate pain.   albuterol (2.5 MG/3ML) 0.083% nebulizer solution Commonly known as:  PROVENTIL USE ONE VIAL (3 ML) IN NEBULIZER EVERY 6 HOURS AS NEEDED FOR WHEEZING OR SHORTNESS OF BREATH. Dx:J45.21, R06.2   PROAIR HFA 108 (90 Base) MCG/ACT inhaler Generic drug:  albuterol INHALE 2 PUFFS BY MOUTH EVERY 6 HOURS AS NEEDED FOR SHORTNESS OF BREATH   amLODipine 10 MG tablet Commonly known as:  NORVASC TAKE 1 TABLET BY MOUTH ONCE DAILY   aspirin EC 81 MG tablet Take 1 tablet (81 mg total) by mouth daily.   atorvastatin 40 MG tablet Commonly known as:  LIPITOR Take 1 tablet (40 mg total) by mouth every evening.   benazepril 40 MG tablet Commonly known as:  LOTENSIN TAKE 1 TABLET BY MOUTH ONCE DAILY FOR BLOOD PRESSURE   carvedilol 12.5 MG tablet Commonly known as:  COREG TAKE ONE TABLET BY MOUTH TWICE DAILY WITH A MEAL   Cholecalciferol 1000 units capsule Commonly known as:  CVS VITAMIN D3 Take 2 capsules (2,000 Units total) by mouth daily.   CINNAMON PO Take 1,000 mg by mouth 2 (two) times daily.   CO Q 10 PO Take 200 mg by mouth daily.   FLONASE 50 MCG/ACT nasal spray Generic drug:  fluticasone Place 2 sprays into both nostrils daily as needed for allergies or rhinitis (SEASONAL ALLERGIES).   Fluticasone-Salmeterol 100-50 MCG/DOSE Aepb Commonly known as:  ADVAIR DISKUS Inhale 1 puff into the lungs 2 (two) times daily.   furosemide 40 MG tablet Commonly known as:  LASIX Take 1.5 tablets (60 mg total) by mouth 2 (two) times daily.   glucose blood test strip Commonly known as:  ONE TOUCH ULTRA TEST Use one strip to check glucose three times daily. Dx: E11.29   hydrALAZINE 25 MG tablet Commonly known as:  APRESOLINE Take 1 tablet (25 mg total) by mouth 2 (two) times daily.   insulin  aspart 100 UNIT/ML FlexPen Commonly known as:  NOVOLOG FLEXPEN INJECT 5 UNITS SUBCUTANEOUSLYIF BLOOD SUGAR IS ABOVE 250, 10 UNITS IF ABOVE 300.   insulin detemir 100 UNIT/ML injection Commonly known as:  LEVEMIR Inject 0.54 mLs (54 Units total) into the skin 2 (two) times daily.   montelukast 10 MG tablet Commonly known as:  SINGULAIR TAKE 1 TABLET BY MOUTH ONCE DAILY AT BEDTIME   nitroGLYCERIN 0.4 MG SL tablet Commonly known as:  NITROSTAT Place 1 tablet (0.4 mg total) under the tongue every 5 (five) minutes as needed for chest pain.   ONE-A-DAY MENS 50+ ADVANTAGE Tabs Take 1 tablet by mouth daily.   terbinafine 1 % cream Commonly known as:  LAMISIL Apply 1 application topically daily as needed (foot).  Review of Systems:  Review of Systems  Constitutional: Positive for fatigue.  HENT: Positive for congestion.   Respiratory: Positive for shortness of breath.   Cardiovascular: Positive for leg swelling.  All other systems reviewed and are negative.   Physical Exam: Vitals:   01/09/17 1040  BP: 110/70  Pulse: 62  Temp: (!) 97.5 F (36.4 C)  TempSrc: Oral  SpO2: 96%  Weight: 227 lb 9.6 oz (103.2 kg)  Height: 6\' 1"  (1.854 m)   Body mass index is 30.03 kg/m. Physical Exam  Constitutional: He is oriented to person, place, and time. He appears well-developed and well-nourished. No distress.  HENT:  Head: Normocephalic and atraumatic.  Right Ear: External ear normal.  Left Ear: External ear normal.  Mouth/Throat: Oropharynx is clear and moist. No oropharyngeal exudate.  MMM; no oral thrush  Eyes: EOM are normal. Pupils are equal, round, and reactive to light. No scleral icterus.  Neck: Normal range of motion. Neck supple. Carotid bruit is not present. No tracheal deviation present. No thyromegaly present.  Cardiovascular: Normal rate, regular rhythm and intact distal pulses. Exam reveals no gallop and no friction rub.  Murmur (1/6 SEM) heard. Trace LE  edema b/l. No calf TTP  Pulmonary/Chest: Effort normal and breath sounds normal. No respiratory distress. He has no wheezes. He has no rales. He exhibits no tenderness.  No rhonchi  Abdominal: Soft. Bowel sounds are normal. He exhibits no distension and no mass. There is no hepatosplenomegaly or hepatomegaly. There is no tenderness. There is no rebound and no guarding. No hernia.  Musculoskeletal: He exhibits edema. He exhibits no deformity.  Lymphadenopathy:    He has no cervical adenopathy.  Neurological: He is alert and oriented to person, place, and time. He has normal reflexes.  Skin: Skin is warm and dry. No rash noted.  Psychiatric: He has a normal mood and affect. His behavior is normal. Judgment and thought content normal.  Vitals reviewed.  Diabetic Foot Exam - Simple   Simple Foot Form Diabetic Foot exam was performed with the following findings:  Yes 01/09/2017  5:24 PM  Visual Inspection No deformities, no ulcerations, no other skin breakdown bilaterally:  Yes Sensation Testing Intact to touch and monofilament testing bilaterally:  Yes Pulse Check Posterior Tibialis and Dorsalis pulse intact bilaterally:  Yes Comments No calluses or ulcerations b/l      Labs reviewed:  Basic Metabolic Panel: Recent Labs    11/20/16 1430 11/22/16 1400 12/25/16 1527  NA 142 134* 146*  K 5.2 4.7 5.2  CL 104 100* 108*  CO2 25 25 25   GLUCOSE 141* 387* 47*  BUN 45* 52* 45*  CREATININE 2.10* 2.35* 2.51*  CALCIUM 9.0 8.6* 9.0   Liver Function Tests: Recent Labs    09/26/16 0903 11/07/16 1530 11/22/16 1400  AST 20 38 24  ALT 19 28 22   ALKPHOS 85 74 67  BILITOT 0.3 <0.2 0.5  PROT 5.6* 5.7* 5.9*  ALBUMIN 3.2* 3.4* 3.1*   No results for input(s): LIPASE, AMYLASE in the last 8760 hours. No results for input(s): AMMONIA in the last 8760 hours. CBC: Recent Labs    09/26/16 0903  11/15/16 0815 11/20/16 1430 11/22/16 1400 12/25/16 1527  WBC 4.4   < > 4.5 3.7 3.1* 4.0    NEUTROABS 2,684  --  2.5  --  1.8  --   HGB 10.1*   < > 8.1* 8.8* 8.6* 9.8*  HCT 29.1*   < > 24.0* 26.4* 25.3* 30.1*  MCV 93.3   < > 95 93 95.5 96  PLT 284   < > 329 297 238 283   < > = values in this interval not displayed.   Lipid Panel: Recent Labs    09/26/16 0903  CHOL 222*  HDL 59  LDLCALC 130*  TRIG 164*  CHOLHDL 3.8   Lab Results  Component Value Date   HGBA1C 10.1 (H) 09/26/2016    Procedures: No results found.  Assessment/Plan   ICD-10-CM   1. Medicare annual wellness visit, subsequent Z00.00   2. Well adult exam Z00.00   3. Uncontrolled type 2 diabetes mellitus with diabetic nephropathy, without long-term current use of insulin (HCC) E11.21    E11.65   4. DM type 2, uncontrolled, with renal complications (HCC) I35.39 Microalbumin/Creatinine Ratio, Urine   E11.65 Hemoglobin A1c  5. Hyperlipidemia with target LDL less than 100 E78.5 Lipid panel  6. Anemia in stage 3 chronic kidney disease (HCC) N18.3    D63.1   7. Asthma, chronic, mild intermittent, uncomplicated N22.58   8. Chronic systolic heart failure (HCC) I50.22   9. PAD (peripheral artery disease) (HCC) I73.9    Continue current medications as ordered  Follow up with specialists as scheduled   Will call with lab results  Follow up in 4 mos for DM, HTN.  Keeping you healthy handout given  Cordella Register. Perlie Gold  [facility] [address] [place], [place] [address] [phone] Cell (Monday-Friday 8 AM - 5 PM) [phone] After 5 PM and follow prompts

## 2017-01-09 NOTE — Patient Instructions (Addendum)
Continue current medications as ordered  Follow up with specialists as scheduled   Will call with lab results  Follow up in 4 mos for DM, HTN.  Keeping you healthy  Get these tests  Blood pressure- Have your blood pressure checked once a year by your healthcare provider.  Normal blood pressure is 120/80  Weight- Have your body mass index (BMI) calculated to screen for obesity.  BMI is a measure of body fat based on height and weight. You can also calculate your own BMI at ViewBanking.si.  Cholesterol- Have your cholesterol checked every year.  Diabetes- Have your blood sugar checked regularly if you have high blood pressure, high cholesterol, have a family history of diabetes or if you are overweight.  Screening for Colon Cancer- Colonoscopy starting at age 45.  Screening may begin sooner depending on your family history and other health conditions. Follow up colonoscopy as directed by your Gastroenterologist.  Screening for Prostate Cancer- Both blood work (PSA) and a rectal exam help screen for Prostate Cancer.  Screening begins at age 66 with African-American men and at age 29 with Caucasian men.  Screening may begin sooner depending on your family history.  Take these medicines  Aspirin- One aspirin daily can help prevent Heart disease and Stroke.  Flu shot- Every fall.  Tetanus- Every 10 years.  Zostavax- Once after the age of 45 to prevent Shingles.  Pneumonia shot- Once after the age of 32; if you are younger than 5, ask your healthcare provider if you need a Pneumonia shot.  Take these steps  Don't smoke- If you do smoke, talk to your doctor about quitting.  For tips on how to quit, go to www.smokefree.gov or call 1-800-QUIT-NOW.  Be physically active- Exercise 5 days a week for at least 30 minutes.  If you are not already physically active start slow and gradually work up to 30 minutes of moderate physical activity.  Examples of moderate activity include  walking briskly, mowing the yard, dancing, swimming, bicycling, etc.  Eat a healthy diet- Eat a variety of healthy food such as fruits, vegetables, low fat milk, low fat cheese, yogurt, lean meant, poultry, fish, beans, tofu, etc. For more information go to www.thenutritionsource.org  Drink alcohol in moderation- Limit alcohol intake to less than two drinks a day. Never drink and drive.  Dentist- Brush and floss twice daily; visit your dentist twice a year.  Depression- Your emotional health is as important as your physical health. If you're feeling down, or losing interest in things you would normally enjoy please talk to your healthcare provider.  Eye exam- Visit your eye doctor every year.  Safe sex- If you may be exposed to a sexually transmitted infection, use a condom.  Seat belts- Seat belts can save your life; always wear one.  Smoke/Carbon Monoxide detectors- These detectors need to be installed on the appropriate level of your home.  Replace batteries at least once a year.  Skin cancer- When out in the sun, cover up and use sunscreen 15 SPF or higher.  Violence- If anyone is threatening you, please tell your healthcare provider. Living Will/ Health care power of attorney- Speak with your healthcare provider and family.

## 2017-01-10 DIAGNOSIS — E1129 Type 2 diabetes mellitus with other diabetic kidney complication: Secondary | ICD-10-CM | POA: Diagnosis not present

## 2017-01-10 DIAGNOSIS — E1165 Type 2 diabetes mellitus with hyperglycemia: Secondary | ICD-10-CM | POA: Diagnosis not present

## 2017-01-10 LAB — HEMOGLOBIN A1C
HEMOGLOBIN A1C: 7.4 %{Hb} — AB (ref <5.7)
MEAN PLASMA GLUCOSE: 166 (calc)
eAG (mmol/L): 9.2 (calc)

## 2017-01-11 LAB — MICROALBUMIN / CREATININE URINE RATIO
Creatinine, Urine: 131 mg/dL (ref 20–320)
MICROALB UR: 271.8 mg/dL
MICROALB/CREAT RATIO: 2075 ug/mg{creat} — AB (ref <30)

## 2017-01-14 ENCOUNTER — Telehealth: Payer: Self-pay | Admitting: Gastroenterology

## 2017-01-14 DIAGNOSIS — K259 Gastric ulcer, unspecified as acute or chronic, without hemorrhage or perforation: Secondary | ICD-10-CM

## 2017-01-14 DIAGNOSIS — K639 Disease of intestine, unspecified: Secondary | ICD-10-CM

## 2017-01-14 NOTE — Telephone Encounter (Signed)
Please inform patient of his small bowel capsule results as outlined below.    Summary & Recommendations: Couple of gastric and small bowel erosions in setting of ASA 81mg  daily. Nothing to really explain IDA. Pertinent images reviewed with Dr. [name] as well.   1. Continue follow up with hematology for your anemia.  2. May continue aspirin but avoid other antiinflammatories.  3. Keep upcoming office visit with Roseanne Kaufman, NP in 02/2017.

## 2017-01-14 NOTE — Telephone Encounter (Signed)
Pt notified of results

## 2017-01-14 NOTE — Op Note (Signed)
Small Bowel [place] Capsule Study Procedure date: 01/08/2017  Referring Provider:  Roseanne Kaufman, NP PCP:  Dr. Eulas Post, Brayton Layman, DO  Indication for procedure: Iron deficiency anemia.  Colonoscopy and upper endoscopy completed in January 2018. EGD normal.  Colonoscopy with diverticulosis, single 5 mm polyp at the splenic flexure (tubular adenoma).  Patient on aspirin 81 mg daily, no PPI.  Patient data:  Wt: 233 pounds Ht: 6 foot 1 inch  Findings: Complete small bowel study, with capsule reaching colon prior to end of study. Prep was good. Couple of gastric erosions, small bowel erosions but otherwise no significant findings to explain iron deficiency anemia.  First Gastric image: 32 seconds First Duodenal image: 38 minutes 24 seconds First Ileo-Cecal Valve image: 3 hours 46 minutes 33 seconds First Cecal image: 3 hours 47 minutes 43 seconds Gastric Passage time: 37 minutes  Small Bowel Passage time: 3 hours 9 minutes   Summary & Recommendations: Couple of gastric and small bowel erosions in setting of ASA 81mg  daily. Nothing to really explain IDA. Pertinent images reviewed with Dr. [name] as well.   1. Continue follow up with hematology for your anemia.  2. May continue aspirin but avoid other antiinflammatories.  3. Keep upcoming office visit with Roseanne Kaufman, NP in 02/2017.   Laureen Ochs. Bernarda Caffey [facility] [phone] 12/3/20181:43 PM

## 2017-02-20 ENCOUNTER — Other Ambulatory Visit: Payer: Self-pay | Admitting: Internal Medicine

## 2017-02-20 DIAGNOSIS — J4521 Mild intermittent asthma with (acute) exacerbation: Secondary | ICD-10-CM

## 2017-03-06 ENCOUNTER — Other Ambulatory Visit (HOSPITAL_COMMUNITY): Payer: Medicare Other

## 2017-03-07 ENCOUNTER — Other Ambulatory Visit (HOSPITAL_COMMUNITY): Payer: Self-pay | Admitting: Adult Health

## 2017-03-07 ENCOUNTER — Inpatient Hospital Stay (HOSPITAL_COMMUNITY): Payer: Medicare Other | Attending: Oncology

## 2017-03-07 DIAGNOSIS — E611 Iron deficiency: Secondary | ICD-10-CM | POA: Diagnosis not present

## 2017-03-07 DIAGNOSIS — I130 Hypertensive heart and chronic kidney disease with heart failure and stage 1 through stage 4 chronic kidney disease, or unspecified chronic kidney disease: Secondary | ICD-10-CM | POA: Insufficient documentation

## 2017-03-07 DIAGNOSIS — N183 Chronic kidney disease, stage 3 (moderate): Secondary | ICD-10-CM | POA: Diagnosis not present

## 2017-03-07 DIAGNOSIS — D509 Iron deficiency anemia, unspecified: Secondary | ICD-10-CM

## 2017-03-07 DIAGNOSIS — E1122 Type 2 diabetes mellitus with diabetic chronic kidney disease: Secondary | ICD-10-CM | POA: Diagnosis not present

## 2017-03-07 DIAGNOSIS — D649 Anemia, unspecified: Secondary | ICD-10-CM | POA: Diagnosis not present

## 2017-03-07 LAB — COMPREHENSIVE METABOLIC PANEL
ALT: 17 U/L (ref 17–63)
AST: 21 U/L (ref 15–41)
Albumin: 3.4 g/dL — ABNORMAL LOW (ref 3.5–5.0)
Alkaline Phosphatase: 61 U/L (ref 38–126)
Anion gap: 10 (ref 5–15)
BUN: 54 mg/dL — ABNORMAL HIGH (ref 6–20)
CHLORIDE: 101 mmol/L (ref 101–111)
CO2: 25 mmol/L (ref 22–32)
CREATININE: 2.49 mg/dL — AB (ref 0.61–1.24)
Calcium: 9 mg/dL (ref 8.9–10.3)
GFR, EST AFRICAN AMERICAN: 29 mL/min — AB (ref >60)
GFR, EST NON AFRICAN AMERICAN: 25 mL/min — AB (ref >60)
Glucose, Bld: 112 mg/dL — ABNORMAL HIGH (ref 65–99)
POTASSIUM: 4.3 mmol/L (ref 3.5–5.1)
SODIUM: 136 mmol/L (ref 135–145)
Total Bilirubin: 0.3 mg/dL (ref 0.3–1.2)
Total Protein: 6.3 g/dL — ABNORMAL LOW (ref 6.5–8.1)

## 2017-03-07 LAB — CBC WITH DIFFERENTIAL/PLATELET
BASOS ABS: 0 10*3/uL (ref 0.0–0.1)
Basophils Relative: 1 %
EOS ABS: 0.1 10*3/uL (ref 0.0–0.7)
EOS PCT: 3 %
HCT: 28.5 % — ABNORMAL LOW (ref 39.0–52.0)
Hemoglobin: 9.6 g/dL — ABNORMAL LOW (ref 13.0–17.0)
Lymphocytes Relative: 38 %
Lymphs Abs: 1.3 10*3/uL (ref 0.7–4.0)
MCH: 31.4 pg (ref 26.0–34.0)
MCHC: 33.7 g/dL (ref 30.0–36.0)
MCV: 93.1 fL (ref 78.0–100.0)
MONO ABS: 0.3 10*3/uL (ref 0.1–1.0)
Monocytes Relative: 9 %
Neutro Abs: 1.8 10*3/uL (ref 1.7–7.7)
Neutrophils Relative %: 49 %
PLATELETS: 269 10*3/uL (ref 150–400)
RBC: 3.06 MIL/uL — AB (ref 4.22–5.81)
RDW: 12.2 % (ref 11.5–15.5)
WBC: 3.5 10*3/uL — AB (ref 4.0–10.5)

## 2017-03-07 LAB — IRON AND TIBC
IRON: 49 ug/dL (ref 45–182)
SATURATION RATIOS: 16 % — AB (ref 17.9–39.5)
TIBC: 298 ug/dL (ref 250–450)
UIBC: 249 ug/dL

## 2017-03-07 LAB — FERRITIN: FERRITIN: 121 ng/mL (ref 24–336)

## 2017-03-08 ENCOUNTER — Ambulatory Visit (INDEPENDENT_AMBULATORY_CARE_PROVIDER_SITE_OTHER): Payer: Medicare Other | Admitting: Gastroenterology

## 2017-03-08 ENCOUNTER — Encounter: Payer: Self-pay | Admitting: Gastroenterology

## 2017-03-08 VITALS — BP 155/81 | HR 63 | Temp 97.0°F | Ht 73.0 in | Wt 229.8 lb

## 2017-03-08 DIAGNOSIS — D508 Other iron deficiency anemias: Secondary | ICD-10-CM

## 2017-03-08 LAB — IMMUNOFIXATION ELECTROPHORESIS
IgA: 144 mg/dL (ref 61–437)
IgG (Immunoglobin G), Serum: 751 mg/dL (ref 700–1600)
IgM (Immunoglobulin M), Srm: 32 mg/dL (ref 20–172)
Total Protein ELP: 5.9 g/dL — ABNORMAL LOW (ref 6.0–8.5)

## 2017-03-08 LAB — KAPPA/LAMBDA LIGHT CHAINS
Kappa free light chain: 93.5 mg/L — ABNORMAL HIGH (ref 3.3–19.4)
Kappa, lambda light chain ratio: 2.04 — ABNORMAL HIGH (ref 0.26–1.65)
Lambda free light chains: 45.8 mg/L — ABNORMAL HIGH (ref 5.7–26.3)

## 2017-03-08 NOTE — Progress Notes (Signed)
Referring Provider: Gildardo Cranker, DO Primary Care Physician:  Gildardo Cranker, DO Primary GI: Dr. [name]   Chief Complaint  Patient presents with  . Anemia    f/u.    HPI:   Henry Carroll is a 67 y.o. male presenting today with a history of IDA. TCS/EGD completed at the beginning of this year without obvious findings. EGD Jan 2018 normal. Colonoscopy with diverticulosis in sigmoid and descending colon, a single 5 mm polyps at splenic flexure (tubular adenoma). Due to persistent IDA, capsule study was ordered. This showed a couple of gastric and small bowel erosions in the setting of aspirin 81 mg daily but nothing concerning. Recommended follow-up with hematology.   Labs yesterday with iron stable at 49, ferritin 121 but possibly reactive, Hgb 9.6. Recommended additional dose of iron. He has no overt GI bleeding. Denies any use of NSAIDs. Not on a PPI and declining to be on this.   Past Medical History:  Diagnosis Date  . Anemia   . 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 Asthma   . Chronic combined systolic (congestive) and diastolic (congestive) heart failure ([place])    a. 12/31/14: 2D ECHO: EF 40-45%, HK of inf myocardium, G1DD, mod MR  . CKD (chronic kidney disease), stage III (HCC)    stage 3 kidney disease  . Coronary artery disease    a. LHC 01/2015 - triple vessel CAD (mod oLM, mLAD, severe mRCA, intermediate branch stenosis, CTO of mCx). Plan CABG 02/2015.  Marland Kitchen GERD (gastroesophageal reflux disease)   . Hyperlipidemia   . Hypertension   . Mitral regurgitation    a. Mild-mod by echo 12/2014.  Marland Kitchen Myocardial infarction [facility])    pt. states per [name] he has in the past  . NSVT (nonsustained ventricular tachycardia) ([place])    a. 9 beats during 01/2015 adm. BB titrated.  . Obesity    a. BMI 33  . Type II diabetes mellitus ([place])     Past Surgical History:  Procedure Laterality Date  . BACK SURGERY    . CARDIAC CATHETERIZATION N/A 02/09/2015   Procedure: Left Heart  Cath and Coronary Angiography;  Surgeon: Burnell Blanks, MD;  Location: [place] [facility];  Service: Cardiovascular;  Laterality: N/A;  . COLONOSCOPY  2011   Dr. [name]: Ileocecal valve appeared normal, scattered pancolonic diverticulosis, difficult bowel prep making smaller lesions potentially missed. Recommended three-year follow-up colonoscopy.  . COLONOSCOPY  2003   Dr. [name]: Suspicious lesion at the ileocecal valve, multiple biopsies benign, pancolonic diverticulosis.  . COLONOSCOPY N/A 02/15/2016   Procedure: COLONOSCOPY;  Surgeon: Daneil Dolin, MD;  [facility];  Service: Endoscopy;  Laterality: N/A;  12:45 pm  . CORONARY ARTERY BYPASS GRAFT N/A 02/18/2015   Procedure: CORONARY ARTERY BYPASS GRAFTING (CABG) x  four, using bilateral internal mammary arteries and right leg greater saphenous vein harvested endoscopically;  Surgeon: [name], MD;  Location: [place] OR;  Service: Open Heart Surgery;  Laterality: N/A;  . ESOPHAGOGASTRODUODENOSCOPY N/A 02/15/2016   Procedure: ESOPHAGOGASTRODUODENOSCOPY (EGD);  Surgeon: Daneil Dolin, MD;  [facility];  Service: Endoscopy;  Laterality: N/A;  . GIVENS CAPSULE STUDY N/A 01/08/2017   Procedure: GIVENS CAPSULE STUDY;  Surgeon: Daneil Dolin, MD;  [facility];  Service: Endoscopy;  Laterality: N/A;  7:30am  . HERNIA REPAIR  [id number]   Umbilical  . LUMBAR DISC SURGERY  2006   L4 & L5  .  TEE WITHOUT CARDIOVERSION N/A 02/18/2015   Procedure: TRANSESOPHAGEAL ECHOCARDIOGRAM (TEE);  Surgeon: [name], MD;  Location: [facility];  Service: Open Heart Surgery;  Laterality: N/A;  . TONSILLECTOMY  1962    Current Outpatient Medications  Medication Sig Dispense Refill  . acetaminophen (TYLENOL) 500 MG tablet Take 1,000 mg by mouth 2 (two) times daily as needed for moderate pain.    Marland Kitchen albuterol (PROVENTIL) (2.5 MG/3ML) 0.083% nebulizer solution USE ONE VIAL (3 ML) IN NEBULIZER EVERY 6 HOURS AS NEEDED FOR WHEEZING OR  SHORTNESS OF BREATH. Dx:J45.21, R06.2 300 mL 11  . amLODipine (NORVASC) 10 MG tablet TAKE 1 TABLET BY MOUTH ONCE DAILY 90 tablet 1  . aspirin EC 81 MG tablet Take 1 tablet (81 mg total) by mouth daily. 90 tablet 3  . atorvastatin (LIPITOR) 40 MG tablet Take 1 tablet (40 mg total) by mouth every evening. 90 tablet 1  . benazepril (LOTENSIN) 40 MG tablet TAKE 1 TABLET BY MOUTH ONCE DAILY FOR BLOOD PRESSURE 90 tablet 1  . carvedilol (COREG) 12.5 MG tablet TAKE ONE TABLET BY MOUTH TWICE DAILY WITH A MEAL 60 tablet 8  . Cholecalciferol ([employer]S VITAMIN D3) 1000 UNITS capsule Take 2 capsules (2,000 Units total) by mouth daily. 60 capsule 3  . CINNAMON PO Take 1,000 mg by mouth 2 (two) times daily.     . Coenzyme Q10 (CO Q 10 PO) Take 200 mg by mouth daily.     . fluticasone (FLONASE) 50 MCG/ACT nasal spray Place 2 sprays into both nostrils daily as needed for allergies or rhinitis (SEASONAL ALLERGIES).    . Fluticasone-Salmeterol (ADVAIR DISKUS) 100-50 MCG/DOSE AEPB Inhale 1 puff into the lungs 2 (two) times daily. 60 each 3  . furosemide (LASIX) 40 MG tablet Take 1.5 tablets (60 mg total) by mouth 2 (two) times daily. 270 tablet 3  . glucose blood (ONE TOUCH ULTRA TEST) test strip Use one strip to check glucose three times daily. Dx: E11.29 100 each 11  . hydrALAZINE (APRESOLINE) 25 MG tablet Take 1 tablet (25 mg total) by mouth 2 (two) times daily. 180 tablet 3  . insulin aspart (NOVOLOG FLEXPEN) 100 UNIT/ML FlexPen INJECT 5 UNITS SUBCUTANEOUSLYIF BLOOD SUGAR IS ABOVE 250, 10 UNITS IF ABOVE 300. 15 pen 3  . insulin detemir (LEVEMIR) 100 UNIT/ML injection Inject 0.54 mLs (54 Units total) into the skin 2 (two) times daily. 30 mL 6  . montelukast (SINGULAIR) 10 MG tablet TAKE 1 TABLET BY MOUTH ONCE DAILY AT BEDTIME 90 tablet 1  . Multiple Vitamins-Minerals (ONE-A-DAY MENS 50+ ADVANTAGE) TABS Take 1 tablet by mouth daily.    . nitroGLYCERIN (NITROSTAT) 0.4 MG SL tablet Place 1 tablet (0.4 mg total) under the  tongue every 5 (five) minutes as needed for chest pain. 25 tablet 3  . PROAIR HFA 108 (90 Base) MCG/ACT inhaler INHALE 2 PUFFS BY MOUTH EVERY 6 HOURS AS NEEDED FOR SHORTNESS OF BREATH 27 each 2  . terbinafine (LAMISIL) 1 % cream Apply 1 application topically daily as needed (foot).      No current facility-administered medications for this visit.    Facility-Administered Medications Ordered in Other Visits  Medication Dose Route Frequency Provider Last Rate Last Dose  . regadenoson (LEXISCAN) injection SOLN 0.4 mg  0.4 mg Intravenous Once Jerline Pain, MD        Allergies as of 03/08/2017  . (No Known Allergies)    Family History  Problem Relation Age of Onset  . Diabetes  Mother   . Heart disease Mother        later in life, age >11  . Heart disease Father        heart failure later in life  . Hypertension Father   . Stroke Sister   . Kidney disease Daughter   . Sudden death Daughter   . Colon cancer Paternal Uncle        Passed from colon CA  . Heart attack Neg Hx     Social History   Socioeconomic History  . Marital status: Married    Spouse name: None  . Number of children: None  . Years of education: None  . Highest education level: None  Social Needs  . Financial resource strain: None  . Food insecurity - worry: None  . Food insecurity - inability: None  . Transportation needs - medical: None  . Transportation needs - non-medical: None  Occupational History  . None  Tobacco Use  . Smoking status: Former Smoker    Years: 17.00    Types: Cigars    Last attempt to quit: 12/31/2014    Years since quitting: 2.1  . Smokeless tobacco: Never Used  Substance and Sexual Activity  . Alcohol use: Yes    Alcohol/week: 0.0 oz    Comment: occasional glass of wine.  . Drug use: No  . Sexual activity: Yes    Birth control/protection: None  Other Topics Concern  . None  Social History Narrative  . None    Review of Systems: Gen: Denies fever, chills, anorexia.  Denies fatigue, weakness, weight loss.  CV: Denies chest pain, palpitations, syncope, peripheral edema, and claudication. Resp: Denies dyspnea at rest, cough, wheezing, coughing up blood, and pleurisy. GI: see HPI  Derm: Denies rash, itching, dry skin Psych: Denies depression, anxiety, memory loss, confusion. No homicidal or suicidal ideation.  Heme: Denies bruising, bleeding, and enlarged lymph nodes.  Physical Exam: BP (!) 155/81   Pulse 63   Temp (!) 97 F (36.1 C) (Oral)   Ht 6\' 1"  (1.854 m)   Wt 229 lb 12.8 oz (104.2 kg)   BMI 30.32 kg/m  General:   Alert and oriented. No distress noted. Pleasant and cooperative.  Head:  Normocephalic and atraumatic. Eyes:  Conjuctiva clear without scleral icterus. Mouth:  Oral mucosa pink and moist. Good dentition. No lesions. Abdomen:  +BS, soft, non-tender and non-distended. No rebound or guarding. No HSM or masses noted. Msk:  Symmetrical without gross deformities. Normal posture. Extremities:  Without edema. Neurologic:  Alert and  oriented x4 Psych:  Alert and cooperative. Normal mood and affect.

## 2017-03-08 NOTE — Patient Instructions (Signed)
Continue to follow-up with Hematology as you are doing.  We will see you in 6 months! Please consider adding a low dose reflux medication (like Protonix, that we can send to pharmacy) as you are on chronic aspirin. Let me know if you change your mind.  Have a great time at the basketball game!

## 2017-03-08 NOTE — Assessment & Plan Note (Signed)
67 year old male with history of IDA, s/p colonoscopy/EGD and capsule without concerning findings. Capsule did note a few gastric and small bowel erosions in the setting of low dose aspirin. Remains on aspirin 81 mg daily and avoiding all other NSAIDs. I discussed starting a PPI as he will be on long-term aspirin, but he is declining this. Will have him return in 6 months. Keep close follow-up with hematology.

## 2017-03-11 LAB — PROTEIN ELECTROPHORESIS, SERUM
A/G Ratio: 1.1 (ref 0.7–1.7)
ALBUMIN ELP: 3 g/dL (ref 2.9–4.4)
ALPHA-1-GLOBULIN: 0.2 g/dL (ref 0.0–0.4)
ALPHA-2-GLOBULIN: 0.9 g/dL (ref 0.4–1.0)
Beta Globulin: 1 g/dL (ref 0.7–1.3)
GLOBULIN, TOTAL: 2.8 g/dL (ref 2.2–3.9)
Gamma Globulin: 0.7 g/dL (ref 0.4–1.8)
M-Spike, %: 0.3 g/dL — ABNORMAL HIGH
TOTAL PROTEIN ELP: 5.8 g/dL — AB (ref 6.0–8.5)

## 2017-03-11 NOTE — Progress Notes (Signed)
CC'D TO PCP °

## 2017-03-12 ENCOUNTER — Encounter: Payer: Self-pay | Admitting: Cardiology

## 2017-03-12 ENCOUNTER — Ambulatory Visit (INDEPENDENT_AMBULATORY_CARE_PROVIDER_SITE_OTHER): Payer: Medicare Other | Admitting: Cardiology

## 2017-03-12 ENCOUNTER — Telehealth: Payer: Self-pay

## 2017-03-12 VITALS — BP 128/76 | HR 78 | Ht 73.0 in | Wt 222.4 lb

## 2017-03-12 DIAGNOSIS — D649 Anemia, unspecified: Secondary | ICD-10-CM

## 2017-03-12 DIAGNOSIS — I5043 Acute on chronic combined systolic (congestive) and diastolic (congestive) heart failure: Secondary | ICD-10-CM

## 2017-03-12 DIAGNOSIS — Z951 Presence of aortocoronary bypass graft: Secondary | ICD-10-CM | POA: Diagnosis not present

## 2017-03-12 DIAGNOSIS — I10 Essential (primary) hypertension: Secondary | ICD-10-CM

## 2017-03-12 DIAGNOSIS — Z79899 Other long term (current) drug therapy: Secondary | ICD-10-CM

## 2017-03-12 DIAGNOSIS — I5042 Chronic combined systolic (congestive) and diastolic (congestive) heart failure: Secondary | ICD-10-CM

## 2017-03-12 MED ORDER — SACUBITRIL-VALSARTAN 24-26 MG PO TABS: 1.0000 | ORAL_TABLET | Freq: Two times a day (BID) | ORAL | 6 refills | Status: DC

## 2017-03-12 NOTE — Progress Notes (Signed)
Cardiology Office Note   Date:  03/12/2017   ID:  Henry Carroll, DOB Mar 11, 1950, MRN [id number]  PCP:  Gildardo Cranker, DO  Cardiologist:   Candee Furbish, MD       History of Present Illness: Henry Carroll is a 67 y.o. male who presents for follow-up, CAD status post 02/18/15 CABG 4, LIMA to LAD, free right internal mammary graft to ramus, SVG to OM, SVG to PDA. 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 Mild to moderate MR. Prior nuclear stress test was intermediate risk showing a medium-sized severe basal to mid inferior inferolateral perfusion defect. EF on nuclear stress was 30%. His cardiac catheterization demonstrated triple-vessel CAD.  Hg 6.5---scopes normal--improved.   03/12/17-still feeling some tiredness, mild shortness of breath.  Class II-III.  No chest pain.  No syncope.  No obvious source of bleeding through thorough hematologic and GI workup.    Past Medical History:  Diagnosis Date  . Anemia   . 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 Asthma   . Chronic combined systolic (congestive) and diastolic (congestive) heart failure ([place])    a. 12/31/14: 2D ECHO: EF 40-45%, HK of inf myocardium, G1DD, mod MR  . CKD (chronic kidney disease), stage III (HCC)    stage 3 kidney disease  . Coronary artery disease    a. LHC 01/2015 - triple vessel CAD (mod oLM, mLAD, severe mRCA, intermediate branch stenosis, CTO of mCx). Plan CABG 02/2015.  Marland Kitchen GERD (gastroesophageal reflux disease)   . Hyperlipidemia   . Hypertension   . Mitral regurgitation    a. Mild-mod by echo 12/2014.  Marland Kitchen Myocardial infarction [facility])    pt. states per [name] he has in the past  . NSVT (nonsustained ventricular tachycardia) ([place])    a. 9 beats during 01/2015 adm. BB titrated.  . Obesity    a. BMI 33  . Type II diabetes mellitus ([place])     Past Surgical History:  Procedure Laterality  Date  . BACK SURGERY    . CARDIAC CATHETERIZATION N/A 02/09/2015   Procedure: Left Heart Cath and Coronary Angiography;  Surgeon: Burnell Blanks, MD;  Location: [place] [facility];  Service: Cardiovascular;  Laterality: N/A;  . COLONOSCOPY  2011   Dr. [name]: Ileocecal valve appeared normal, scattered pancolonic diverticulosis, difficult bowel prep making smaller lesions potentially missed. Recommended three-year follow-up colonoscopy.  . COLONOSCOPY  2003   Dr. [name]: Suspicious lesion at the ileocecal valve, multiple biopsies benign, pancolonic diverticulosis.  . COLONOSCOPY N/A 02/15/2016   diverticulosis in sigmoid and descending colon, single 5 mm polyp at splenic flexure (Tubular adenoma)  . CORONARY ARTERY BYPASS GRAFT N/A 02/18/2015   Procedure: CORONARY ARTERY BYPASS GRAFTING (CABG) x  four, using bilateral internal mammary arteries and right leg greater saphenous vein harvested endoscopically;  Surgeon: [name], MD;  Location: [place] OR;  Service: Open Heart Surgery;  Laterality: N/A;  . ESOPHAGOGASTRODUODENOSCOPY N/A 02/15/2016   normal  . GIVENS CAPSULE STUDY N/A 01/08/2017   couple of gastric and small bowel eroions in setting of aspirin 81 mg daily but nothing concerning, continue Hematology follow-up  . HERNIA REPAIR  [id number]   Umbilical  . LUMBAR [place] SURGERY  2006   L4 & L5  . TEE WITHOUT CARDIOVERSION N/A 02/18/2015   Procedure: TRANSESOPHAGEAL ECHOCARDIOGRAM (TEE);  Surgeon: [name], MD;  Location: [facility];  Service:  Open Heart Surgery;  Laterality: N/A;  . TONSILLECTOMY  1962     Current Outpatient Medications  Medication Sig Dispense Refill  . acetaminophen (TYLENOL) 500 MG tablet Take 1,000 mg by mouth 2 (two) times daily as needed for moderate pain.    Marland Kitchen albuterol (PROVENTIL) (2.5 MG/3ML) 0.083% nebulizer solution USE ONE VIAL (3 ML) IN NEBULIZER EVERY 6 HOURS AS NEEDED FOR WHEEZING OR SHORTNESS OF BREATH. Dx:J45.21, R06.2 300 mL 11  . amLODipine (NORVASC) 10  MG tablet TAKE 1 TABLET BY MOUTH ONCE DAILY 90 tablet 1  . aspirin EC 81 MG tablet Take 1 tablet (81 mg total) by mouth daily. 90 tablet 3  . atorvastatin (LIPITOR) 40 MG tablet Take 1 tablet (40 mg total) by mouth every evening. 90 tablet 1  . carvedilol (COREG) 12.5 MG tablet TAKE ONE TABLET BY MOUTH TWICE DAILY WITH A MEAL 60 tablet 8  . Cholecalciferol (CVS VITAMIN D3) 1000 UNITS capsule Take 2 capsules (2,000 Units total) by mouth daily. 60 capsule 3  . CINNAMON PO Take 1,000 mg by mouth 2 (two) times daily.     . Coenzyme Q10 (CO Q 10 PO) Take 200 mg by mouth daily.     . fluticasone (FLONASE) 50 MCG/ACT nasal spray Place 2 sprays into both nostrils daily as needed for allergies or rhinitis (SEASONAL ALLERGIES).    . Fluticasone-Salmeterol (ADVAIR DISKUS) 100-50 MCG/DOSE AEPB Inhale 1 puff into the lungs 2 (two) times daily. 60 each 3  . furosemide (LASIX) 40 MG tablet Take 1.5 tablets (60 mg total) by mouth 2 (two) times daily. 270 tablet 3  . glucose blood (ONE TOUCH ULTRA TEST) test strip Use one strip to check glucose three times daily. Dx: E11.29 100 each 11  . hydrALAZINE (APRESOLINE) 25 MG tablet Take 1 tablet (25 mg total) by mouth 2 (two) times daily. 180 tablet 3  . insulin aspart (NOVOLOG FLEXPEN) 100 UNIT/ML FlexPen INJECT 5 UNITS SUBCUTANEOUSLYIF BLOOD SUGAR IS ABOVE 250, 10 UNITS IF ABOVE 300. 15 pen 3  . insulin detemir (LEVEMIR) 100 UNIT/ML injection Inject 0.54 mLs (54 Units total) into the skin 2 (two) times daily. 30 mL 6  . montelukast (SINGULAIR) 10 MG tablet TAKE 1 TABLET BY MOUTH ONCE DAILY AT BEDTIME 90 tablet 1  . Multiple Vitamins-Minerals (ONE-A-DAY MENS 50+ ADVANTAGE) TABS Take 1 tablet by mouth daily.    . nitroGLYCERIN (NITROSTAT) 0.4 MG SL tablet Place 1 tablet (0.4 mg total) under the tongue every 5 (five) minutes as needed for chest pain. 25 tablet 3  . PROAIR HFA 108 (90 Base) MCG/ACT inhaler INHALE 2 PUFFS BY MOUTH EVERY 6 HOURS AS NEEDED FOR SHORTNESS OF  BREATH 27 each 2  . terbinafine (LAMISIL) 1 % cream Apply 1 application topically daily as needed (foot).     . sacubitril-valsartan (ENTRESTO) 24-26 MG Take 1 tablet by mouth 2 (two) times daily. 60 tablet 6   No current facility-administered medications for this visit.    Facility-Administered Medications Ordered in Other Visits  Medication Dose Route Frequency Provider Last Rate Last Dose  . regadenoson (LEXISCAN) injection SOLN 0.4 mg  0.4 mg Intravenous Once Jerline Pain, MD        Allergies:   Patient has no known allergies.    Social History:  The patient  reports that he quit smoking about 2 years ago. His smoking use included cigars. He quit after 17.00 years of use. he has never used smokeless tobacco. He reports that  he drinks alcohol. He reports that he does not use drugs.   Family History:  The patient's family history includes Colon cancer in his paternal uncle; Diabetes in his mother; Heart disease in his father and mother; Hypertension in his father; Kidney disease in his daughter; Stroke in his sister; Sudden death in his daughter.    ROS:  Please see the history of present illness.   Otherwise, review of systems are positive for none.   All other systems are reviewed and negative.    PHYSICAL EXAM: VS:  BP 128/76   Pulse 78   Ht 6\' 1"  (1.854 m)   Wt 222 lb 6.4 oz (100.9 kg)   SpO2 91%   BMI 29.34 kg/m  , BMI Body mass index is 29.34 kg/m. GEN: Well nourished, well developed, in no acute distress  HEENT: normal  Neck: no JVD, carotid bruits, or masses Cardiac: RRR; no murmurs, rubs, or gallops,no edema  Respiratory:  clear to auscultation bilaterally, normal work of breathing GI: soft, nontender, nondistended, + BS MS: no deformity or atrophy  Skin: warm and dry, no rash, pale Neuro:  Alert and Oriented x 3, Strength and sensation are intact Psych: euthymic mood, full affect    EKG:  None today   Recent Labs: 11/07/2016: NT-Pro BNP 1,448 03/07/2017:  ALT 17; BUN 54; Creatinine, Ser 2.49; Hemoglobin 9.6; Platelets 269; Potassium 4.3; Sodium 136    Lipid Panel    Component Value Date/Time   CHOL 195 01/09/2017 1148   CHOL 149 06/01/2015 1500   TRIG 177 (H) 01/09/2017 1148   HDL 44 01/09/2017 1148   HDL 45 06/01/2015 1500   CHOLHDL 4.4 01/09/2017 1148   VLDL 33 (H) 09/26/2016 0903   LDLCALC 130 (H) 09/26/2016 0903   LDLCALC 84 06/01/2015 1500      Wt Readings from Last 3 Encounters:  03/12/17 222 lb 6.4 oz (100.9 kg)  03/08/17 229 lb 12.8 oz (104.2 kg)  01/09/17 227 lb 9.6 oz (103.2 kg)      Other studies Reviewed: Additional studies/ records that were reviewed today include: Hospital records, lab work, catheterization.  Limited Echo Study Conclusions from 12/2015  - Left ventricle: The cavity size was mildly dilated. Wall thickness was increased in a pattern of mild LVH. Systolic function was moderately reduced. The estimated ejection fraction was in the range of 35% to 40%. Diffuse hypokinesis. There is akinesis of the mid-apicalinferolateral myocardium. Features are consistent with a pseudonormal left ventricular filling pattern, with concomitant abnormal relaxation and increased filling pressure (grade 2 diastolic dysfunction). - Aortic valve: Trileaflet; mildly thickened, mildly calcified leaflets. - Aorta: Ascending aortic diameter: 40 mm (S). - Ascending aorta: The ascending aorta was mildly dilated. - Mitral valve: There was mild regurgitation. - Left atrium: The atrium was mildly dilated. - Tricuspid valve: There was moderate regurgitation. - Pulmonary arteries: Systolic pressure was mildly increased. PA peak pressure: 42 mm Hg (S).  Review of the above records demonstrates: As above   ASSESSMENT AND PLAN:  Coronary artery disease status post CABG 02/18/15  - No angina   - Aspirin 81 currently.  Chronic systolic heart failure  - ejection fraction 40% echocardiogram  - Continue with  carvedilol 12.5 twice a day, Benazepril  - Continuing hydralazine.   - Furosemide-expressed the importance of continuing with close monitoring of fluid weight.  - Doing much better. NYHA class 2  -Start Entresto 24/26 PO BID. Stop benazepril 40 mg for 36 hours prior to initiation.  His GFR is less than 30.  This is the appropriate dose.  Check a basic metabolic profile in 1 week.  Have him come back in 1 month.  Hypertensive heart disease with heart failure  - Blood pressure medications reviewed as above.  - no change  Hyperlipidemia  -Continue with atorvastatin 40 mg  - I would like to see LDL less than 70. No change    Diabetes with vascular complications  - CAD, no angina  - Per primary team. He states he is having some side effects on his new diabetic medication. He will discuss.  CKD 3  - Avoid Aleve. Discussed.  - Note reviewed from 01/27/15 from [facility] prior creatinine 2.4, Dr. Margret Chance.   Anemia  - Endoscopies normal.   - Hg 6.5 at one point. All work up normal. Iron infusion.  Contributing to fatigue.   Current medicines are reviewed at length with the patient today.  The patient does not have concerns regarding medicines.  The following changes have been made:  no change  Labs/ tests ordered today include: none  Orders Placed This Encounter  Procedures  . Basic metabolic panel     Disposition:   FU with Cecille Rubin in 6 months 12 months with me  Signed, Candee Furbish, MD  03/12/2017 3:32 PM    [facility] [address], [place], [place]  [address] Phone: [phone]; Fax: [phone]

## 2017-03-12 NOTE — Telephone Encounter (Signed)
**Note De-Identified Navon Kotowski Obfuscation** I did a Entresto PA through covermymeds.

## 2017-03-12 NOTE — Patient Instructions (Signed)
Medication Instructions:  Please stop Benazepril.  Once you have been off of it for 36 hours you may start Entresto 24-26 mg twice a day. Continue all other medications as listed.  Labwork: Please have blood work (BMP) 03/21/2017  Follow-Up: Follow up in 1 month with either Dr Marlou Porch or APP.  If you need a refill on your cardiac medications before your next appointment, please call your pharmacy.  Thank you for choosing [facility]!!

## 2017-03-13 ENCOUNTER — Ambulatory Visit (HOSPITAL_COMMUNITY): Payer: Medicare Other

## 2017-03-14 ENCOUNTER — Inpatient Hospital Stay (HOSPITAL_BASED_OUTPATIENT_CLINIC_OR_DEPARTMENT_OTHER): Payer: Medicare Other | Admitting: Oncology

## 2017-03-14 ENCOUNTER — Other Ambulatory Visit: Payer: Self-pay

## 2017-03-14 ENCOUNTER — Encounter (HOSPITAL_COMMUNITY): Payer: Self-pay | Admitting: Oncology

## 2017-03-14 ENCOUNTER — Inpatient Hospital Stay (HOSPITAL_COMMUNITY): Payer: Medicare Other

## 2017-03-14 VITALS — BP 127/58 | HR 97 | Temp 98.0°F | Resp 18 | Wt 223.4 lb

## 2017-03-14 VITALS — BP 112/57 | HR 56 | Resp 16 | Ht 73.0 in | Wt 223.0 lb

## 2017-03-14 DIAGNOSIS — D509 Iron deficiency anemia, unspecified: Secondary | ICD-10-CM

## 2017-03-14 DIAGNOSIS — D649 Anemia, unspecified: Secondary | ICD-10-CM | POA: Diagnosis not present

## 2017-03-14 DIAGNOSIS — N183 Chronic kidney disease, stage 3 (moderate): Secondary | ICD-10-CM | POA: Diagnosis not present

## 2017-03-14 DIAGNOSIS — E1122 Type 2 diabetes mellitus with diabetic chronic kidney disease: Secondary | ICD-10-CM | POA: Diagnosis not present

## 2017-03-14 DIAGNOSIS — E611 Iron deficiency: Secondary | ICD-10-CM | POA: Diagnosis not present

## 2017-03-14 DIAGNOSIS — I130 Hypertensive heart and chronic kidney disease with heart failure and stage 1 through stage 4 chronic kidney disease, or unspecified chronic kidney disease: Secondary | ICD-10-CM

## 2017-03-14 MED ORDER — SODIUM CHLORIDE 0.9 % IV SOLN
INTRAVENOUS | Status: DC
  Administered 2017-03-14: 10:00:00 via INTRAVENOUS

## 2017-03-14 MED ORDER — SODIUM CHLORIDE 0.9 % IV SOLN
750.0000 mg | Freq: Once | INTRAVENOUS | Status: AC
  Administered 2017-03-14: 750 mg via INTRAVENOUS
  Filled 2017-03-14: qty 15

## 2017-03-14 NOTE — Telephone Encounter (Signed)
Received fax from [facility] regarding ENTRESTO, states approval valid until 02/11/2038.

## 2017-03-14 NOTE — Patient Instructions (Signed)
[facility] at [facility] Discharge Instructions  RECOMMENDATIONS MADE BY THE CONSULTANT AND ANY TEST RESULTS WILL BE SENT TO YOUR REFERRING PHYSICIAN.  Injectafer given  Follow up as scheduled  Thank you for choosing [facility] at [facility] to provide your oncology and hematology care.  To afford each patient quality time with our provider, please arrive at least 15 minutes before your scheduled appointment time.    If you have a lab appointment with the Cancer Center please come in thru the  Main Entrance and check in at the main information desk  You need to re-schedule your appointment should you arrive 10 or more minutes late.  We strive to give you quality time with our providers, and arriving late affects you and other patients whose appointments are after yours.  Also, if you no show three or more times for appointments you may be dismissed from the clinic at the providers discretion.     Again, thank you for choosing [facility].  Our hope is that these requests will decrease the amount of time that you wait before being seen by our physicians.       _____________________________________________________________  Should you have questions after your visit to [facility], please contact our office at [phone] between the hours of 8:30 a.m. and 4:30 p.m.  Voicemails left after 4:30 p.m. will not be returned until the following business day.  For prescription refill requests, have your pharmacy contact our office.       Resources For Cancer Patients and their Caregivers ? American Cancer Society: Can assist with transportation, wigs, general needs, runs Look Good Feel Better.        1-888-227-6333 ? Cancer Care: Provides financial assistance, online support groups, medication/co-pay assistance.  1-800-813-HOPE (4673) ? [facility] Assists [place] cancer patients and their  families through emotional , educational and financial support.  336-427-4357 ? [place] DSS Where to apply for food stamps, Medicaid and utility assistance. 336-342-1394 ? [facility]: Transportation to medical appointments. 336-347-2287 ? Social Security Administration: May apply for disability if have a Stage IV cancer. 336-342-7796 1-800-772-1213 ? [place] Aging, Disability and Transit Services: Assists with nutrition, care and transit needs. 336-349-2343  Cancer Center Support Programs: @[email or web address]@ > Cancer Support Group  2nd Tuesday of the month 1pm-2pm, Journey Room  > Creative Journey  3rd Tuesday of the month 1130am-1pm, Journey Room  > Look Good Feel Better  1st Wednesday of the month 10am-12 noon, Journey Room (Call American Cancer Society to register 1-800-395-5775)   

## 2017-03-14 NOTE — Progress Notes (Signed)
Henry Carroll, [place] [address]   CLINIC:  Medical Oncology/Hematology  PCP:  Gildardo Cranker, DO [place] [id number] [phone]   REASON FOR VISIT:  Follow-up for Anemia   CURRENT THERAPY: Initial work-up    HISTORY OF PRESENT ILLNESS:  (From Dr. Laverle Patter note on 11/22/16)  Henry Carroll 67 y.o. male presents today for evaluation of normocytic anemia. Patient has history of CKD stage III. Most recent lab work from 11/20/2016 demonstrated a WBC 3.7K, hemoglobin 8.8 g/dL, hematocrit 26.4%, platelet count 297K, creatinine 2.1. Review of his lab work from the past year demonstrate that his hemoglobin has ranged in the 8-10 g/dL range. His most recent iron studies from 09/26/2016 demonstrated serum iron 17, TIBC 309, percent saturation 6%, ferritin 43. Patient denies any recent melena, hematochezia, hematuria, hemoptysis. He states that he did receive IV iron infusions in the past, and per records he did receive 2 doses ferriheme in December 2017. Currently not on any oral iron. Otherwise he states that overall she feels well except for some fatigue. He has occasional shortness of breath with exertion. He has swelling in his feet. Otherwise he denies any fevers or chills, dysphagia, chest pain, abdominal pain, nausea vomiting, diarrhea, constipation.  Last GI workup was on 02/15/16. He had a colonoscopy which demonstrated diverticulosis in the sigmoid colon and in the descending colon. One 5 mm polyp at the splenic flexure, removed with a cold snare. Resected and retrieved. The examination was otherwise normal on direct and retroflexion views. No findings in either upper or lower GI tract to explain anemia via a mechanism of GI bleeding. EGD on 02/15/16 demonstrated Normal esophagus. Normal stomach. Normal duodenal bulb and second portion of the duodenum. Patient states he will be seeing GI in October for a capsule  endoscopy.     INTERVAL HISTORY:  Henry Carroll 67 y.o. male returns for routine follow-up for anemia.   Patient reports to clinic today for additional dose of IV Injectafer.  He states he has occasional fatigue and shortness of breath otherwise feels fine.  75% and energy levels are 50% he denies any pain.  Last dose of Injectafer was on 12/13/2016 and 12/20/2016 which she states helped "some".  He denies any bleeding episodes.  He recently had a capsule endoscopy which was negative.  He denies any bone pains, headaches, changes in vision or dizziness.     REVIEW OF SYSTEMS:  Review of Systems  Constitutional: Positive for fatigue. Negative for chills and fever.  HENT:  Negative.  Negative for lump/mass and nosebleeds.   Eyes: Negative.   Respiratory: Negative.  Negative for cough and shortness of breath.   Cardiovascular: Negative.  Negative for chest pain and leg swelling.  Gastrointestinal: Negative.  Negative for abdominal pain, blood in stool, constipation, diarrhea, nausea and vomiting.  Endocrine: Negative.   Genitourinary: Negative.  Negative for dysuria and hematuria.   Musculoskeletal: Negative.  Negative for arthralgias.  Skin: Negative.  Negative for rash.  Neurological: Negative.  Negative for dizziness and headaches.  Hematological: Negative.  Negative for adenopathy. Does not bruise/bleed easily.  Psychiatric/Behavioral: Negative.  Negative for depression and sleep disturbance. The patient is not nervous/anxious.      PAST MEDICAL/SURGICAL HISTORY:  Past Medical History:  Diagnosis Date  . Anemia   . 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 Asthma   . Chronic combined systolic (congestive) and diastolic (congestive) heart failure (HCC)  a. 12/31/14: 2D ECHO: EF 40-45%, HK of inf myocardium, G1DD, mod MR  . CKD (chronic kidney disease), stage III (HCC)    stage 3 kidney disease  . Coronary artery disease    a. LHC 01/2015 - triple vessel CAD (mod oLM, mLAD, severe  mRCA, intermediate branch stenosis, CTO of mCx). Plan CABG 02/2015.  Marland Kitchen GERD (gastroesophageal reflux disease)   . Hyperlipidemia   . Hypertension   . Mitral regurgitation    a. Mild-mod by echo 12/2014.  Marland Kitchen Myocardial infarction [facility])    pt. states per [name] he has in the past  . NSVT (nonsustained ventricular tachycardia) ([place])    a. 9 beats during 01/2015 adm. BB titrated.  . Obesity    a. BMI 33  . Type II diabetes mellitus ([place])    Past Surgical History:  Procedure Laterality Date  . BACK SURGERY    . CARDIAC CATHETERIZATION N/A 02/09/2015   Procedure: Left Heart Cath and Coronary Angiography;  Surgeon: Burnell Blanks, MD;  Location: [facility];  Service: Cardiovascular;  Laterality: N/A;  . COLONOSCOPY  2011   Dr. [name]: Ileocecal valve appeared normal, scattered pancolonic diverticulosis, difficult bowel prep making smaller lesions potentially missed. Recommended three-year follow-up colonoscopy.  . COLONOSCOPY  2003   Dr. [name]: Suspicious lesion at the ileocecal valve, multiple biopsies benign, pancolonic diverticulosis.  . COLONOSCOPY N/A 02/15/2016   diverticulosis in sigmoid and descending colon, single 5 mm polyp at splenic flexure (Tubular adenoma)  . CORONARY ARTERY BYPASS GRAFT N/A 02/18/2015   Procedure: CORONARY ARTERY BYPASS GRAFTING (CABG) x  four, using bilateral internal mammary arteries and right leg greater saphenous vein harvested endoscopically;  Surgeon: [name], MD;  Location: [facility];  Service: Open Heart Surgery;  Laterality: N/A;  . ESOPHAGOGASTRODUODENOSCOPY N/A 02/15/2016   normal  . GIVENS CAPSULE STUDY N/A 01/08/2017   couple of gastric and small bowel eroions in setting of aspirin 81 mg daily but nothing concerning, continue Hematology follow-up  . HERNIA REPAIR  4010   Umbilical  . LUMBAR Paul SURGERY  2006   L4 & L5  . TEE WITHOUT CARDIOVERSION N/A 02/18/2015   Procedure: TRANSESOPHAGEAL ECHOCARDIOGRAM (TEE);  Surgeon: [name], MD;  Location: [facility];  Service: Open Heart Surgery;  Laterality: N/A;  . TONSILLECTOMY  1962     SOCIAL HISTORY:  Social History   Socioeconomic History  . Marital status: Married    Spouse name: Not on file  . Number of children: Not on file  . Years of education: Not on file  . Highest education level: Not on file  Social Needs  . Financial resource strain: Not on file  . Food insecurity - worry: Not on file  . Food insecurity - inability: Not on file  . Transportation needs - medical: Not on file  . Transportation needs - non-medical: Not on file  Occupational History  . Not on file  Tobacco Use  . Smoking status: Former Smoker    Years: 17.00    Types: Cigars    Last attempt to quit: 12/31/2014    Years since quitting: 2.2  . Smokeless tobacco: Never Used  Substance and Sexual Activity  . Alcohol use: Yes    Alcohol/week: 0.0 oz    Comment: occasional glass of wine.  . Drug use: No  . Sexual activity: Yes    Birth control/protection: None  Other Topics Concern  . Not on file  Social History Narrative  .  Not on file    FAMILY HISTORY:  Family History  Problem Relation Age of Onset  . Diabetes Mother   . Heart disease Mother        later in life, age >35  . Heart disease Father        heart failure later in life  . Hypertension Father   . Stroke Sister   . Kidney disease Daughter   . Sudden death Daughter   . Colon cancer Paternal Uncle        Passed from colon CA  . Heart attack Neg Hx     CURRENT MEDICATIONS:  Outpatient Encounter Medications as of 03/14/2017  Medication Sig  . acetaminophen (TYLENOL) 500 MG tablet Take 1,000 mg by mouth 2 (two) times daily as needed for moderate pain.  Marland Kitchen albuterol (PROVENTIL) (2.5 MG/3ML) 0.083% nebulizer solution USE ONE VIAL (3 ML) IN NEBULIZER EVERY 6 HOURS AS NEEDED FOR WHEEZING OR SHORTNESS OF BREATH. Dx:J45.21, R06.2  . amLODipine (NORVASC) 10 MG tablet TAKE 1 TABLET BY MOUTH ONCE DAILY  . aspirin EC  81 MG tablet Take 1 tablet (81 mg total) by mouth daily.  Marland Kitchen atorvastatin (LIPITOR) 40 MG tablet Take 1 tablet (40 mg total) by mouth every evening.  . carvedilol (COREG) 12.5 MG tablet TAKE ONE TABLET BY MOUTH TWICE DAILY WITH A MEAL  . Cholecalciferol ([employer]S VITAMIN D3) 1000 UNITS capsule Take 2 capsules (2,000 Units total) by mouth daily.  Marland Kitchen CINNAMON PO Take 1,000 mg by mouth 2 (two) times daily.   . Coenzyme Q10 (CO Q 10 PO) Take 200 mg by mouth daily.   . fluticasone (FLONASE) 50 MCG/ACT nasal spray Place 2 sprays into both nostrils daily as needed for allergies or rhinitis (SEASONAL ALLERGIES).  . Fluticasone-Salmeterol (ADVAIR DISKUS) 100-50 MCG/DOSE AEPB Inhale 1 puff into the lungs 2 (two) times daily.  . furosemide (LASIX) 40 MG tablet Take 1.5 tablets (60 mg total) by mouth 2 (two) times daily.  Marland Kitchen glucose blood (ONE TOUCH ULTRA TEST) test strip Use one strip to check glucose three times daily. Dx: E11.29  . hydrALAZINE (APRESOLINE) 25 MG tablet Take 1 tablet (25 mg total) by mouth 2 (two) times daily.  . insulin aspart (NOVOLOG FLEXPEN) 100 UNIT/ML FlexPen INJECT 5 UNITS SUBCUTANEOUSLYIF BLOOD SUGAR IS ABOVE 250, 10 UNITS IF ABOVE 300.  Marland Kitchen insulin detemir (LEVEMIR) 100 UNIT/ML injection Inject 0.54 mLs (54 Units total) into the skin 2 (two) times daily.  . montelukast (SINGULAIR) 10 MG tablet TAKE 1 TABLET BY MOUTH ONCE DAILY AT BEDTIME  . Multiple Vitamins-Minerals (ONE-A-DAY MENS 50+ ADVANTAGE) TABS Take 1 tablet by mouth daily.  . nitroGLYCERIN (NITROSTAT) 0.4 MG SL tablet Place 1 tablet (0.4 mg total) under the tongue every 5 (five) minutes as needed for chest pain.  Marland Kitchen PROAIR HFA 108 (90 Base) MCG/ACT inhaler INHALE 2 PUFFS BY MOUTH EVERY 6 HOURS AS NEEDED FOR SHORTNESS OF BREATH  . sacubitril-valsartan (ENTRESTO) 24-26 MG Take 1 tablet by mouth 2 (two) times daily.  Marland Kitchen terbinafine (LAMISIL) 1 % cream Apply 1 application topically daily as needed (foot).    Facility-Administered  Encounter Medications as of 03/14/2017  Medication  . regadenoson (LEXISCAN) injection SOLN 0.4 mg    ALLERGIES:  No Known Allergies   PHYSICAL EXAM:  ECOG Performance status: 0-1 - Mildly symptomatic; remains completely independent   Vitals:   03/14/17 1006  BP: (!) 112/57  Pulse: (!) 56  Resp: 16  SpO2: 100%   Filed  Weights   03/14/17 1006  Weight: 223 lb (101.2 kg)    Physical Exam  Constitutional: He is oriented to person, place, and time and well-developed, well-nourished, and in no distress.  HENT:  Head: Normocephalic.  Mouth/Throat: Oropharynx is clear and moist. No oropharyngeal exudate.  Eyes: Conjunctivae are normal. Pupils are equal, round, and reactive to light. No scleral icterus.  Neck: Normal range of motion. Neck supple.  Cardiovascular: Normal rate and regular rhythm.  Pulmonary/Chest: Effort normal and breath sounds normal. No respiratory distress. He has no wheezes.  Abdominal: Soft. Bowel sounds are normal. There is no tenderness.  Musculoskeletal: Normal range of motion. He exhibits no edema.  Lymphadenopathy:    He has no cervical adenopathy.       Right: No supraclavicular adenopathy present.       Left: No supraclavicular adenopathy present.  Neurological: He is alert and oriented to person, place, and time. No cranial nerve deficit. Gait normal.  Skin: Skin is warm and dry. No rash noted.  Psychiatric: Mood, memory, affect and judgment normal.  Nursing note and vitals reviewed.    LABORATORY DATA:  I have reviewed the labs as listed.  CBC    Component Value Date/Time   WBC 3.5 (L) 03/07/2017 1035   RBC 3.06 (L) 03/07/2017 1035   HGB 9.6 (L) 03/07/2017 1035   HGB 9.8 (L) 12/25/2016 1527   HCT 28.5 (L) 03/07/2017 1035   HCT 30.1 (L) 12/25/2016 1527   PLT 269 03/07/2017 1035   PLT 283 12/25/2016 1527   MCV 93.1 03/07/2017 1035   MCV 96 12/25/2016 1527   MCH 31.4 03/07/2017 1035   MCHC 33.7 03/07/2017 1035   RDW 12.2 03/07/2017 1035    RDW 13.8 12/25/2016 1527   LYMPHSABS 1.3 03/07/2017 1035   LYMPHSABS 1.1 11/15/2016 0815   MONOABS 0.3 03/07/2017 1035   EOSABS 0.1 03/07/2017 1035   EOSABS 0.2 11/15/2016 0815   BASOSABS 0.0 03/07/2017 1035   BASOSABS 0.0 11/15/2016 0815   CMP Latest Ref Rng & Units 03/07/2017 12/25/2016 11/22/2016  Glucose 65 - 99 mg/dL 112(H) 47(L) 387(H)  BUN 6 - 20 mg/dL 54(H) 45(H) 52(H)  Creatinine 0.61 - 1.24 mg/dL 2.49(H) 2.51(H) 2.35(H)  Sodium 135 - 145 mmol/L 136 146(H) 134(L)  Potassium 3.5 - 5.1 mmol/L 4.3 5.2 4.7  Chloride 101 - 111 mmol/L 101 108(H) 100(L)  CO2 22 - 32 mmol/L _0 Calcium 8.9 - 10.3 mg/dL 9.0 9.0 8.6(L)  Total Protein 6.5 - 8.1 g/dL 6.3(L) - 5.9(L)  Total Bilirubin 0.3 - 1.2 mg/dL 0.3 - 0.5  Alkaline Phos 38 - 126 U/L 61 - 67  AST 15 - 41 U/L 21 - 24  ALT 17 - 63 U/L 17 - 22   Results for DEONDRA, WIGGER (MRN [id number])   Ref. Range 11/22/2016 14:00  Iron Latest Ref Range: 45 - 182 ug/dL 45  UIBC Latest Units: ug/dL 316  TIBC Latest Ref Range: 250 - 450 ug/dL 361  Saturation Ratios Latest Ref Range: 17.9 - 39.5 % 12 (L)  Ferritin Latest Ref Range: 24 - 336 ng/mL 21 (L)  Vitamin B12 Latest Ref Range: 180 - 914 pg/mL 1,123 (H)      PENDING LABS:    DIAGNOSTIC IMAGING:    PATHOLOGY:        ASSESSMENT & PLAN:   Anemia:  -Initially thought to be secondary to stage III CKD, with an element of iron deficiency.  -Labs today reveal: CBC  Latest Ref Rng & Units 03/07/2017 12/25/2016 11/22/2016  WBC 4.0 - 10.5 K/uL 3.5(L) 4.0 3.1(L)  Hemoglobin 13.0 - 17.0 g/dL 9.6(L) 9.8(L) 8.6(L)  Hematocrit 39.0 - 52.0 % 28.5(L) 30.1(L) 25.3(L)  Platelets 150 - 400 K/uL 269 283 238   Iron/TIBC/Ferritin/ %Sat    Component Value Date/Time   IRON 49 03/07/2017 1036   TIBC 298 03/07/2017 1036   FERRITIN 121 03/07/2017 1036   IRONPCTSAT 16 (L) 03/07/2017 1036   IRONPCTSAT 6 (L) 09/26/2016 4920   Iron saturations continue to be low at 16%.  Ferritin is  121.  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  Haptoglobin and erythropoietin normal. "CRAB" symptoms.  Calcium remains normal, renal function elevated with creatinine in approximate 2 range, anemia continues to be present but he denies bone pain. 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 M spike 0.3. Stable. Continue to monitor with periodic blood work. Case consulted with Dr. Grayland Ormond. MD.   Iron deficiency:  Proceed with one dose IV Injectafer.  Calculated iron deficit is ~ 444 mg with a target HGB of 14 g/dL.  Supportive therapy plan is built.  Dispo:  -One dose of injectafer today. -Return to cancer center in 2 months for follow-up, labs and possible IV iron.   All questions were answered to patient's stated satisfaction. Encouraged patient to call with any new concerns or questions before his next visit to the cancer center and we can certain see him sooner, if needed.    Plan of care discussed with Dr. Grayland Ormond, who agrees with the above aforementioned.    Orders placed this encounter:  No orders of the defined types were placed in this encounter.   Faythe Casa, NP 03/14/2017 4:31 PM

## 2017-03-14 NOTE — Progress Notes (Signed)
Treatment given per orders. Patient tolerated it well without problems. Vitals stable and discharged home from clinic ambulatory. Follow up as scheduled.  

## 2017-03-15 ENCOUNTER — Other Ambulatory Visit: Payer: Self-pay | Admitting: Internal Medicine

## 2017-03-15 DIAGNOSIS — E1121 Type 2 diabetes mellitus with diabetic nephropathy: Secondary | ICD-10-CM

## 2017-03-15 DIAGNOSIS — E1165 Type 2 diabetes mellitus with hyperglycemia: Principal | ICD-10-CM

## 2017-03-15 DIAGNOSIS — IMO0002 Reserved for concepts with insufficient information to code with codable children: Secondary | ICD-10-CM

## 2017-03-15 NOTE — Telephone Encounter (Signed)
[facility].  

## 2017-04-10 ENCOUNTER — Encounter: Payer: Self-pay | Admitting: Cardiology

## 2017-04-10 DIAGNOSIS — Z79899 Other long term (current) drug therapy: Secondary | ICD-10-CM | POA: Diagnosis not present

## 2017-04-10 DIAGNOSIS — I5042 Chronic combined systolic (congestive) and diastolic (congestive) heart failure: Secondary | ICD-10-CM | POA: Diagnosis not present

## 2017-04-10 DIAGNOSIS — I110 Hypertensive heart disease with heart failure: Secondary | ICD-10-CM | POA: Diagnosis not present

## 2017-04-12 ENCOUNTER — Encounter: Payer: Self-pay | Admitting: Cardiology

## 2017-04-12 ENCOUNTER — Ambulatory Visit (INDEPENDENT_AMBULATORY_CARE_PROVIDER_SITE_OTHER): Payer: Medicare Other | Admitting: Cardiology

## 2017-04-12 VITALS — BP 116/70 | HR 82 | Ht 73.0 in | Wt 224.4 lb

## 2017-04-12 DIAGNOSIS — Z951 Presence of aortocoronary bypass graft: Secondary | ICD-10-CM | POA: Diagnosis not present

## 2017-04-12 DIAGNOSIS — N189 Chronic kidney disease, unspecified: Secondary | ICD-10-CM | POA: Diagnosis not present

## 2017-04-12 DIAGNOSIS — I10 Essential (primary) hypertension: Secondary | ICD-10-CM | POA: Diagnosis not present

## 2017-04-12 DIAGNOSIS — I5042 Chronic combined systolic (congestive) and diastolic (congestive) heart failure: Secondary | ICD-10-CM | POA: Diagnosis not present

## 2017-04-12 DIAGNOSIS — E119 Type 2 diabetes mellitus without complications: Secondary | ICD-10-CM

## 2017-04-12 DIAGNOSIS — D631 Anemia in chronic kidney disease: Secondary | ICD-10-CM

## 2017-04-12 NOTE — Patient Instructions (Signed)
Medication Instructions:  Your physician recommends that you continue on your current medications as directed. Please refer to the Current Medication list given to you today.   Labwork: None ordered  Testing/Procedures: None ordered  Follow-Up: Your physician wants you to follow-up in: 6 months with Truitt Merle, NP. You will receive a reminder letter in the mail two months in advance. If you don't receive a letter, please call our office to schedule the follow-up appointment.   Your physician wants you to follow-up in: 1 year with [name]. You will receive a reminder letter in the mail two months in advance. If you don't receive a letter, please call our office to schedule the follow-up appointment.   Any Other Special Instructions Will Be Listed Below (If Applicable).     If you need a refill on your cardiac medications before your next appointment, please call your pharmacy.

## 2017-04-12 NOTE — Progress Notes (Signed)
Cardiology Office Note   Date:  04/12/2017   ID:  Henry Carroll, DOB [birth year], MRN [id number]  PCP:  Gildardo Cranker, DO  Cardiologist:   Candee Furbish, MD       History of Present Illness: Henry Carroll is a 67 y.o. male who presents for follow-up, CAD status post 02/18/15 CABG 4, LIMA to LAD, free right internal mammary graft to ramus, SVG to OM, SVG to PDA. 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 Mild to moderate MR. Prior nuclear stress test was intermediate risk showing a medium-sized severe basal to mid inferior inferolateral perfusion defect. EF on nuclear stress was 30%. His cardiac catheterization demonstrated triple-vessel CAD.  Hg 6.5---scopes normal--improved.   03/12/17-still feeling some tiredness, mild shortness of breath.  Class II-III.  No chest pain.  No syncope.  No obvious source of bleeding through thorough hematologic and GI workup.  04/12/17-he is tolerating the Entresto well.  He feels as though his lower extremity edema has improved.  Blood pressure is better.  No chest pain syncope bleeding orthopnea.  Enjoying golf.    Past Medical History:  Diagnosis Date  . Anemia   . 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 Asthma   . Chronic combined systolic (congestive) and diastolic (congestive) heart failure ([place])    a. 12/31/14: 2D ECHO: EF 40-45%, HK of inf myocardium, G1DD, mod MR  . CKD (chronic kidney disease), stage III (HCC)    stage 3 kidney disease  . Coronary artery disease    a. LHC 01/2015 - triple vessel CAD (mod oLM, mLAD, severe mRCA, intermediate branch stenosis, CTO of mCx). Plan CABG 02/2015.  Marland Kitchen GERD (gastroesophageal reflux disease)   . Hyperlipidemia   . Hypertension   . Mitral regurgitation    a. Mild-mod by echo 12/2014.  Marland Kitchen Myocardial infarction [facility])    pt. states per [name] he has in the past  . NSVT (nonsustained  ventricular tachycardia) ([place])    a. 9 beats during 01/2015 adm. BB titrated.  . Obesity    a. BMI 33  . Type II diabetes mellitus ([place])     Past Surgical History:  Procedure Laterality Date  . BACK SURGERY    . CARDIAC CATHETERIZATION N/A 02/09/2015   Procedure: Left Heart Cath and Coronary Angiography;  Surgeon: Burnell Blanks, MD;  Location: [place] [facility];  Service: Cardiovascular;  Laterality: N/A;  . COLONOSCOPY  2011   Dr. [name]: Ileocecal valve appeared normal, scattered pancolonic diverticulosis, difficult bowel prep making smaller lesions potentially missed. Recommended three-year follow-up colonoscopy.  . COLONOSCOPY  2003   Dr. [name]: Suspicious lesion at the ileocecal valve, multiple biopsies benign, pancolonic diverticulosis.  . COLONOSCOPY N/A 02/15/2016   diverticulosis in sigmoid and descending colon, single 5 mm polyp at splenic flexure (Tubular adenoma)  . CORONARY ARTERY BYPASS GRAFT N/A 02/18/2015   Procedure: CORONARY ARTERY BYPASS GRAFTING (CABG) x  four, using bilateral internal mammary arteries and right leg greater saphenous vein harvested endoscopically;  Surgeon: [name], MD;  Location: [place] [facility];  Service: Open Heart Surgery;  Laterality: N/A;  . ESOPHAGOGASTRODUODENOSCOPY N/A 02/15/2016   normal  . GIVENS CAPSULE STUDY N/A 01/08/2017   couple of gastric and small bowel eroions in setting of aspirin 81 mg daily but nothing concerning, continue Hematology follow-up  . HERNIA REPAIR  [id number]   Umbilical  . LUMBAR [place]  SURGERY  2006   L4 & L5  . TEE WITHOUT CARDIOVERSION N/A 02/18/2015   Procedure: TRANSESOPHAGEAL ECHOCARDIOGRAM (TEE);  Surgeon: [name], MD;  Location: [place];  Service: Open Heart Surgery;  Laterality: N/A;  . TONSILLECTOMY  1962     Current Outpatient Medications  Medication Sig Dispense Refill  . acetaminophen (TYLENOL) 500 MG tablet Take 1,000 mg by mouth 2 (two) times daily as needed for moderate pain.    Marland Kitchen albuterol  (PROVENTIL) (2.5 MG/3ML) 0.083% nebulizer solution USE ONE VIAL (3 ML) IN NEBULIZER EVERY 6 HOURS AS NEEDED FOR WHEEZING OR SHORTNESS OF BREATH. Dx:J45.21, R06.2 300 mL 11  . amLODipine (NORVASC) 10 MG tablet TAKE 1 TABLET BY MOUTH ONCE DAILY 90 tablet 1  . aspirin EC 81 MG tablet Take 1 tablet (81 mg total) by mouth daily. 90 tablet 3  . atorvastatin (LIPITOR) 40 MG tablet Take 1 tablet (40 mg total) by mouth every evening. 90 tablet 1  . carvedilol (COREG) 12.5 MG tablet TAKE ONE TABLET BY MOUTH TWICE DAILY WITH A MEAL 60 tablet 8  . Cholecalciferol ([employer] VITAMIN D3) 1000 UNITS capsule Take 2 capsules (2,000 Units total) by mouth daily. 60 capsule 3  . CINNAMON PO Take 1,000 mg by mouth 2 (two) times daily.     . Coenzyme Q10 (CO Q 10 PO) Take 200 mg by mouth daily.     . fluticasone (FLONASE) 50 MCG/ACT nasal spray Place 2 sprays into both nostrils daily as needed for allergies or rhinitis (SEASONAL ALLERGIES).    . Fluticasone-Salmeterol (ADVAIR DISKUS) 100-50 MCG/DOSE AEPB Inhale 1 puff into the lungs 2 (two) times daily. 60 each 3  . furosemide (LASIX) 40 MG tablet Take 1.5 tablets (60 mg total) by mouth 2 (two) times daily. 270 tablet 3  . glucose blood (ONE TOUCH ULTRA TEST) test strip Use one strip to check glucose three times daily. Dx: E11.29 100 each 11  . hydrALAZINE (APRESOLINE) 25 MG tablet Take 1 tablet (25 mg total) by mouth 2 (two) times daily. 180 tablet 3  . insulin aspart (NOVOLOG FLEXPEN) 100 UNIT/ML FlexPen INJECT 5 UNITS SUBCUTANEOUSLYIF BLOOD SUGAR IS ABOVE 250, 10 UNITS IF ABOVE 300. 15 pen 3  . LEVEMIR 100 UNIT/ML injection INJECT 0.54MLS (54 UNITS TOTAL) INTO THE SKIN TWICE DAILY 60 mL 6  . montelukast (SINGULAIR) 10 MG tablet TAKE 1 TABLET BY MOUTH ONCE DAILY AT BEDTIME 90 tablet 1  . Multiple Vitamins-Minerals (ONE-A-DAY MENS 50+ ADVANTAGE) TABS Take 1 tablet by mouth daily.    . nitroGLYCERIN (NITROSTAT) 0.4 MG SL tablet Place 1 tablet (0.4 mg total) under the tongue  every 5 (five) minutes as needed for chest pain. 25 tablet 3  . PROAIR HFA 108 (90 Base) MCG/ACT inhaler INHALE 2 PUFFS BY MOUTH EVERY 6 HOURS AS NEEDED FOR SHORTNESS OF BREATH 27 each 2  . sacubitril-valsartan (ENTRESTO) 24-26 MG Take 1 tablet by mouth 2 (two) times daily. 60 tablet 6  . terbinafine (LAMISIL) 1 % cream Apply 1 application topically daily as needed (foot).      No current facility-administered medications for this visit.    Facility-Administered Medications Ordered in Other Visits  Medication Dose Route Frequency Provider Last Rate Last Dose  . regadenoson (LEXISCAN) injection SOLN 0.4 mg  0.4 mg Intravenous Once Jerline Pain, MD        Allergies:   Patient has no known allergies.    Social History:  The patient  reports that  he quit smoking about 2 years ago. His smoking use included cigars. He quit after 17.00 years of use. he has never used smokeless tobacco. He reports that he drinks alcohol. He reports that he does not use drugs.   Family History:  The patient's family history includes Colon cancer in his paternal uncle; Diabetes in his mother; Heart disease in his father and mother; Hypertension in his father; Kidney disease in his daughter; Stroke in his sister; Sudden death in his daughter.    ROS:  Please see the history of present illness.   All other ROS neg  PHYSICAL EXAM: VS:  BP 116/70   Pulse 82   Ht 6\' 1"  (1.854 m)   Wt 224 lb 6.4 oz (101.8 kg)   SpO2 97%   BMI 29.61 kg/m  , BMI Body mass index is 29.61 kg/m. GEN: Well nourished, well developed, in no acute distress  HEENT: normal  Neck: no JVD, carotid bruits, or masses Cardiac: RRR; no murmurs, rubs, or gallops,no edema  Respiratory:  clear to auscultation bilaterally, normal work of breathing GI: soft, nontender, nondistended, + BS MS: no deformity or atrophy  Skin: warm and dry, no rash Neuro:  Alert and Oriented x 3, Strength and sensation are intact Psych: euthymic mood, full  affect     EKG:  None today   Recent Labs: 11/07/2016: NT-Pro BNP 1,448 03/07/2017: ALT 17; BUN 54; Creatinine, Ser 2.49; Hemoglobin 9.6; Platelets 269; Potassium 4.3; Sodium 136    Lipid Panel    Component Value Date/Time   CHOL 195 01/09/2017 1148   CHOL 149 06/01/2015 1500   TRIG 177 (H) 01/09/2017 1148   HDL 44 01/09/2017 1148   HDL 45 06/01/2015 1500   CHOLHDL 4.4 01/09/2017 1148   VLDL 33 (H) 09/26/2016 0903   LDLCALC 130 (H) 09/26/2016 0903   LDLCALC 84 06/01/2015 1500      Wt Readings from Last 3 Encounters:  04/12/17 224 lb 6.4 oz (101.8 kg)  03/14/17 223 lb (101.2 kg)  03/14/17 223 lb 6.4 oz (101.3 kg)      Other studies Reviewed: Additional studies/ records that were reviewed today include: Hospital records, lab work, catheterization.  Limited Echo Study Conclusions from 12/2015  - Left ventricle: The cavity size was mildly dilated. Wall thickness was increased in a pattern of mild LVH. Systolic function was moderately reduced. The estimated ejection fraction was in the range of 35% to 40%. Diffuse hypokinesis. There is akinesis of the mid-apicalinferolateral myocardium. Features are consistent with a pseudonormal left ventricular filling pattern, with concomitant abnormal relaxation and increased filling pressure (grade 2 diastolic dysfunction). - Aortic valve: Trileaflet; mildly thickened, mildly calcified leaflets. - Aorta: Ascending aortic diameter: 40 mm (S). - Ascending aorta: The ascending aorta was mildly dilated. - Mitral valve: There was mild regurgitation. - Left atrium: The atrium was mildly dilated. - Tricuspid valve: There was moderate regurgitation. - Pulmonary arteries: Systolic pressure was mildly increased. PA peak pressure: 42 mm Hg (S).  Review of the above records demonstrates: As above   ASSESSMENT AND PLAN:  Coronary artery disease status post CABG 02/18/15  - No angina   - Aspirin 81 currently.   -Aggressive secondary prevention.  Medications reviewed.  Chronic systolic heart failure  - ejection fraction 40% echocardiogram  - Continue with carvedilol 12.5 twice a day  - Continuing hydralazine.  If blood pressure gets too low with Entresto, we can discontinue the hydralazine.  - Furosemide-expressed the importance of continuing with  close monitoring of fluid weight.  Overall been doing quite well.  - Doing much better. NYHA class 2  -Entresto 24/26 PO BID. Stopped benazepril 40 mg  His GFR is less than 30.  This is the appropriate dose.  Basic metabolic profile shows stable creatinine 2.49.  Potassium is 4.3.  He feels as though he is having less edema.  Hypertensive heart disease with heart failure  - Blood pressure medications reviewed as above.  -Doing very well, blood pressure is excellent on Entresto.  Hyperlipidemia  -Continue with atorvastatin 40 mg  - I would like to see LDL less than 70. No change, last LDL 122 November 2018.  Continue work on diet.  I would consider increasing his atorvastatin to 80 mg at next clinic visit    Diabetes with vascular complications  - CAD, no angina  - Per primary team. He states he is having some side effects on his new diabetic medication.  Last hemoglobin A1c 7.4.  Continue to strive her less than 7.  CKD 3-4  - Avoid Aleve. Discussed.  - Note reviewed from 01/27/15 from [facility] prior creatinine 2.4, Dr. Margret Chance.  No changes.  Anemia  - Endoscopies normal.   - Hg 6.5 at one point. All work up normal. Iron infusion.  Contributing to fatigue.  Continue iron infusions.   Current medicines are reviewed at length with the patient today.  The patient does not have concerns regarding medicines.  The following changes have been made:  no change  Labs/ tests ordered today include: none  No orders of the defined types were placed in this encounter.    Disposition:   FU with Cecille Rubin in 6 months 12 months with  me  Signed, Candee Furbish, MD  04/12/2017 9:22 AM    [facility] [address], [place], [place]  [address] Phone: [phone]; Fax: [phone]

## 2017-04-23 ENCOUNTER — Other Ambulatory Visit: Payer: Self-pay | Admitting: Physician Assistant

## 2017-05-06 ENCOUNTER — Other Ambulatory Visit: Payer: Self-pay | Admitting: Cardiology

## 2017-05-07 ENCOUNTER — Other Ambulatory Visit: Payer: Self-pay | Admitting: Internal Medicine

## 2017-05-07 ENCOUNTER — Other Ambulatory Visit: Payer: Self-pay | Admitting: Cardiology

## 2017-05-08 ENCOUNTER — Ambulatory Visit (INDEPENDENT_AMBULATORY_CARE_PROVIDER_SITE_OTHER): Payer: Medicare Other | Admitting: Internal Medicine

## 2017-05-08 ENCOUNTER — Encounter: Payer: Self-pay | Admitting: Internal Medicine

## 2017-05-08 ENCOUNTER — Ambulatory Visit
Admission: RE | Admit: 2017-05-08 | Discharge: 2017-05-08 | Disposition: A | Discharge status: Home / Self Care | Payer: Medicare Other | Source: Ambulatory Visit | Attending: Internal Medicine | Admitting: Internal Medicine

## 2017-05-08 ENCOUNTER — Ambulatory Visit (INDEPENDENT_AMBULATORY_CARE_PROVIDER_SITE_OTHER): Payer: Medicare Other

## 2017-05-08 VITALS — BP 118/64 | HR 73 | Temp 98.0°F | Ht 73.0 in | Wt 226.0 lb

## 2017-05-08 DIAGNOSIS — I10 Essential (primary) hypertension: Secondary | ICD-10-CM | POA: Diagnosis not present

## 2017-05-08 DIAGNOSIS — E1165 Type 2 diabetes mellitus with hyperglycemia: Secondary | ICD-10-CM | POA: Diagnosis not present

## 2017-05-08 DIAGNOSIS — Z79899 Other long term (current) drug therapy: Secondary | ICD-10-CM | POA: Diagnosis not present

## 2017-05-08 DIAGNOSIS — I5022 Chronic systolic (congestive) heart failure: Secondary | ICD-10-CM | POA: Diagnosis not present

## 2017-05-08 DIAGNOSIS — J4520 Mild intermittent asthma, uncomplicated: Secondary | ICD-10-CM | POA: Diagnosis not present

## 2017-05-08 DIAGNOSIS — N183 Chronic kidney disease, stage 3 unspecified: Secondary | ICD-10-CM

## 2017-05-08 DIAGNOSIS — E1121 Type 2 diabetes mellitus with diabetic nephropathy: Secondary | ICD-10-CM | POA: Diagnosis not present

## 2017-05-08 DIAGNOSIS — E785 Hyperlipidemia, unspecified: Secondary | ICD-10-CM | POA: Diagnosis not present

## 2017-05-08 DIAGNOSIS — Z0000 Encounter for general adult medical examination without abnormal findings: Secondary | ICD-10-CM

## 2017-05-08 DIAGNOSIS — IMO0002 Reserved for concepts with insufficient information to code with codable children: Secondary | ICD-10-CM

## 2017-05-08 DIAGNOSIS — J302 Other seasonal allergic rhinitis: Secondary | ICD-10-CM

## 2017-05-08 DIAGNOSIS — R05 Cough: Secondary | ICD-10-CM | POA: Diagnosis not present

## 2017-05-08 NOTE — Progress Notes (Signed)
Subjective:   Henry Carroll is a 67 y.o. male who presents for Medicare Annual/Subsequent preventive examination.  Last AWV-01/09/2017    Objective:    Vitals: BP 118/64 (BP Location: Right Arm, Patient Position: Sitting)   Pulse 73   Temp 98 F (36.7 C) (Oral)   Ht 6\' 1"  (1.854 m)   Wt 226 lb (102.5 kg)   SpO2 97%   BMI 29.82 kg/m   Body mass index is 29.82 kg/m.  Advanced Directives 05/08/2017 03/14/2017 12/20/2016 12/11/2016 11/22/2016 05/28/2016 04/06/2016  Does Patient Have a Medical Advance Directive? Yes No No No Yes No Yes  Type of Advance Directive Living will - Living will - Living will - Living will  Does patient want to make changes to medical advance directive? No - Patient declined - No - Patient declined - No - Patient declined - -  Copy of [place] in [place]  Would patient like information on creating a medical advance directive? - No - Patient declined No - Patient declined No - Patient declined - - -    Tobacco Social History   Tobacco Use  Smoking Status Former Smoker  . Years: 17.00  . Types: Cigars  . Last attempt to quit: 12/31/2014  . Years since quitting: 2.3  Smokeless Tobacco Never Used     Counseling given: Not Answered   Clinical Intake:  Pre-visit preparation completed: No  Pain : No/denies pain     Nutritional Risks: None Diabetes: No  How often do you need to have someone help you when you read instructions, pamphlets, or other written materials from your doctor or pharmacy?: 1 - Never What is the last grade level you completed in school?: College  Interpreter Needed?: No  Information entered by :: Tyson Dense, RN  Past Medical History:  Diagnosis Date  . Anemia   . 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 Asthma   . Chronic combined systolic (congestive) and diastolic (congestive) heart failure ([place])    a. 12/31/14: 2D ECHO: EF 40-45%, HK of inf myocardium, G1DD, mod MR  . CKD (chronic  kidney disease), stage III (HCC)    stage 3 kidney disease  . Coronary artery disease    a. LHC 01/2015 - triple vessel CAD (mod oLM, mLAD, severe mRCA, intermediate branch stenosis, CTO of mCx). Plan CABG 02/2015.  Marland Kitchen GERD (gastroesophageal reflux disease)   . Hyperlipidemia   . Hypertension   . Mitral regurgitation    a. Mild-mod by echo 12/2014.  Marland Kitchen Myocardial infarction [facility])    pt. states per [name] he has in the past  . NSVT (nonsustained ventricular tachycardia) ([place])    a. 9 beats during 01/2015 adm. BB titrated.  . Obesity    a. BMI 33  . Type II diabetes mellitus ([place])    Past Surgical History:  Procedure Laterality Date  . BACK SURGERY    . CARDIAC CATHETERIZATION N/A 02/09/2015   Procedure: Left Heart Cath and Coronary Angiography;  Surgeon: Burnell Blanks, MD;  Location: [place] [facility];  Service: Cardiovascular;  Laterality: N/A;  . COLONOSCOPY  2011   Dr. [name]: Ileocecal valve appeared normal, scattered pancolonic diverticulosis, difficult bowel prep making smaller lesions potentially missed. Recommended three-year follow-up colonoscopy.  . COLONOSCOPY  2003   Dr. [name]: Suspicious lesion at the ileocecal valve, multiple biopsies benign, pancolonic diverticulosis.  . COLONOSCOPY N/A 02/15/2016   diverticulosis in  sigmoid and descending colon, single 5 mm polyp at splenic flexure (Tubular adenoma)  . CORONARY ARTERY BYPASS GRAFT N/A 02/18/2015   Procedure: CORONARY ARTERY BYPASS GRAFTING (CABG) x  four, using bilateral internal mammary arteries and right leg greater saphenous vein harvested endoscopically;  Surgeon: [name], MD;  Location: [place] OR;  Service: Open Heart Surgery;  Laterality: N/A;  . ESOPHAGOGASTRODUODENOSCOPY N/A 02/15/2016   normal  . GIVENS CAPSULE STUDY N/A 01/08/2017   couple of gastric and small bowel eroions in setting of aspirin 81 mg daily but nothing concerning, continue Hematology follow-up  . HERNIA REPAIR  3299   Umbilical  .  LUMBAR [place] SURGERY  2006   L4 & L5  . TEE WITHOUT CARDIOVERSION N/A 02/18/2015   Procedure: TRANSESOPHAGEAL ECHOCARDIOGRAM (TEE);  Surgeon: [name], MD;  Location: [facility];  Service: Open Heart Surgery;  Laterality: N/A;  . TONSILLECTOMY  1962   Family History  Problem Relation Age of Onset  . Diabetes Mother   . Heart disease Mother        later in life, age >35  . Heart disease Father        heart failure later in life  . Hypertension Father   . Stroke Sister   . Kidney disease Daughter   . Sudden death Daughter   . Colon cancer Paternal Uncle        Passed from colon CA  . Heart attack Neg Hx    Social History   Socioeconomic History  . Marital status: Married    Spouse name: Not on file  . Number of children: Not on file  . Years of education: Not on file  . Highest education level: Not on file  Occupational History  . Not on file  Social Needs  . Financial resource strain: Not hard at all  . Food insecurity:    Worry: Never true    Inability: Never true  . Transportation needs:    Medical: No    Non-medical: No  Tobacco Use  . Smoking status: Former Smoker    Years: 17.00    Types: Cigars    Last attempt to quit: 12/31/2014    Years since quitting: 2.3  . Smokeless tobacco: Never Used  Substance and Sexual Activity  . Alcohol use: Yes    Alcohol/week: 0.0 oz    Comment: occasional glass of wine.  . Drug use: No  . Sexual activity: Yes    Birth control/protection: None  Lifestyle  . Physical activity:    Days per week: 5 days    Minutes per session: 30 min  . Stress: Only a little  Relationships  . Social connections:    Talks on phone: More than three times a week    Gets together: More than three times a week    Attends religious service: More than 4 times per year    Active member of club or organization: Yes    Attends meetings of clubs or organizations: 1 to 4 times per year    Relationship status: Married  Other Topics Concern  . Not on  file  Social History Narrative  . Not on file    Outpatient Encounter Medications as of 05/08/2017  Medication Sig  . acetaminophen (TYLENOL) 500 MG tablet Take 1,000 mg by mouth 2 (two) times daily as needed for moderate pain.  Marland Kitchen albuterol (PROVENTIL) (2.5 MG/3ML) 0.083% nebulizer solution USE ONE VIAL (3 ML) IN NEBULIZER EVERY 6 HOURS AS  NEEDED FOR WHEEZING OR SHORTNESS OF BREATH. Dx:J45.21, R06.2  . amLODipine (NORVASC) 10 MG tablet TAKE 1 TABLET BY MOUTH ONCE DAILY  . aspirin EC 81 MG tablet Take 1 tablet (81 mg total) by mouth daily.  Marland Kitchen atorvastatin (LIPITOR) 40 MG tablet Take 1 tablet (40 mg total) by mouth every evening.  . carvedilol (COREG) 12.5 MG tablet TAKE ONE TABLET BY MOUTH TWICE DAILY WITH A MEAL  . Cholecalciferol ([employer] VITAMIN D3) 1000 UNITS capsule Take 2 capsules (2,000 Units total) by mouth daily.  Marland Kitchen CINNAMON PO Take 1,000 mg by mouth 2 (two) times daily.   . Coenzyme Q10 (CO Q 10 PO) Take 200 mg by mouth daily.   . fluticasone (FLONASE) 50 MCG/ACT nasal spray Place 2 sprays into both nostrils daily as needed for allergies or rhinitis (SEASONAL ALLERGIES).  . Fluticasone-Salmeterol (ADVAIR DISKUS) 100-50 MCG/DOSE AEPB Inhale 1 puff into the lungs 2 (two) times daily.  . furosemide (LASIX) 40 MG tablet Take 1.5 tablets (60 mg total) by mouth 2 (two) times daily.  Marland Kitchen glucose blood (ONE TOUCH ULTRA TEST) test strip Use one strip to check glucose three times daily. Dx: E11.29  . hydrALAZINE (APRESOLINE) 25 MG tablet TAKE ONE TABLET BY MOUTH TWICE DAILY  . insulin aspart (NOVOLOG FLEXPEN) 100 UNIT/ML FlexPen INJECT 5 UNITS SUBCUTANEOUSLYIF BLOOD SUGAR IS ABOVE 250, 10 UNITS IF ABOVE 300.  Marland Kitchen LEVEMIR 100 UNIT/ML injection INJECT 0.54MLS (54 UNITS TOTAL) INTO THE SKIN TWICE DAILY  . montelukast (SINGULAIR) 10 MG tablet TAKE 1 TABLET BY MOUTH ONCE DAILY AT BEDTIME  . Multiple Vitamins-Minerals (ONE-A-DAY MENS 50+ ADVANTAGE) TABS Take 1 tablet by mouth daily.  . nitroGLYCERIN  (NITROSTAT) 0.4 MG SL tablet Place 1 tablet (0.4 mg total) under the tongue every 5 (five) minutes as needed for chest pain.  Marland Kitchen PROAIR HFA 108 (90 Base) MCG/ACT inhaler INHALE 2 PUFFS BY MOUTH EVERY 6 HOURS AS NEEDED FOR SHORTNESS OF BREATH  . sacubitril-valsartan (ENTRESTO) 24-26 MG Take 1 tablet by mouth 2 (two) times daily.  Marland Kitchen terbinafine (LAMISIL) 1 % cream Apply 1 application topically daily as needed (foot).    Facility-Administered Encounter Medications as of 05/08/2017  Medication  . regadenoson (LEXISCAN) injection SOLN 0.4 mg    Activities of Daily Living In your present state of health, do you have any difficulty performing the following activities: 05/08/2017 01/09/2017  Hearing? N N  Vision? N N  Difficulty concentrating or making decisions? Y N  Walking or climbing stairs? N N  Dressing or bathing? N N  Doing errands, shopping? N N  Preparing Food and eating ? N -  Using the Toilet? N -  In the past six months, have you accidently leaked urine? N -  Comment frequency -  Do you have problems with loss of bowel control? N -  Managing your Medications? N -  Managing your Finances? N -  Housekeeping or managing your Housekeeping? N -  Some recent data might be hidden    Patient Care Team: Gildardo Cranker, DO as PCP - General (Internal Medicine) Jerline Pain, MD as PCP - Cardiology (Cardiology) Jerline Pain, MD as Consulting Physician (Cardiology) Fleet Contras, MD as Consulting Physician (Nephrology) Rutherford Guys, MD as Consulting Physician (Ophthalmology) [name], MD as Consulting Physician (Gastroenterology)   Assessment:   This is a routine wellness examination for [place].  Exercise Activities and Dietary recommendations Current Exercise Habits: Home exercise routine, Type of exercise: walking;Other - see comments(golf), Time (  Minutes): 30, Frequency (Times/Week): 5, Weekly Exercise (Minutes/Week): 150, Intensity: Mild, Exercise limited by: None  identified  Goals    None      Fall Risk Fall Risk  05/08/2017 01/09/2017 09/26/2016 05/28/2016 04/06/2016  Falls in the past year? No No No No No  Number falls in past yr: - - - - -  Injury with Fall? - - - - -  Risk for fall due to : - - - - -  Risk for fall due to: Comment - - - - -   Is the patient's home free of loose throw rugs in walkways, pet beds, electrical cords, etc?   yes      Grab bars in the bathroom? no      Handrails on the stairs?   yes      Adequate lighting?   yes   Depression Screen PHQ 2/9 Scores 05/08/2017 01/09/2017 04/06/2016 12/21/2015  PHQ - 2 Score 0 0 0 0  PHQ- 9 Score - - - -  Exception Documentation - - - -    Cognitive Function: done within last year MMSE - Mini Mental State Exam 01/09/2017 04/06/2016 12/21/2015 12/01/2014 05/05/2013  Not completed: - (No Data) - - -  Orientation to time 5 - 5 5 4   Orientation to Place 5 - 5 5 5   Registration 3 - 3 3 3   Attention/ Calculation 5 - 5 5 5   Recall 3 - 1 1 2   Language- name 2 objects 2 - 2 2 2   Language- repeat 1 - 1 1 1   Language- follow 3 step command 3 - 3 3 3   Language- read & follow direction 1 - 1 1 1   Write a sentence 1 - 1 1 1   Copy design 1 - 1 1 1   Total score 30 - 28 28 28         Immunization History  Administered Date(s) Administered  . Influenza,inj,Quad PF,6+ Mos 10/15/2012, 01/05/2014, 12/01/2014, 12/05/2015, 12/11/2016  . Influenza-Unspecified 02/04/2012  . Pneumococcal Conjugate-13 02/09/2014  . Pneumococcal Polysaccharide-23 05/05/2013  . Tdap 03/26/2011  . Zoster Recombinat (Shingrix) 12/13/2016, 03/15/2017    Qualifies for Shingles Vaccine? Up to date  Screening Tests Health Maintenance  Topic Date Due  . Hepatitis C Screening  02/12/2018 (Originally [birth year])  . OPHTHALMOLOGY EXAM  06/19/2017  . HEMOGLOBIN A1C  07/09/2017  . URINE MICROALBUMIN  01/10/2018  . TETANUS/TDAP  03/25/2021  . COLONOSCOPY  02/14/2026  . INFLUENZA VACCINE  Completed  . PNA vac Low Risk  Adult  Completed   Cancer Screenings: Lung: Low Dose CT Chest recommended if Age 61-80 years, 30 pack-year currently smoking OR have quit w/in 15years. Patient does not qualify. Colorectal: up to date  Additional Screenings:  Hepatitis C Screening: declined    Plan:    I have personally reviewed and addressed the Medicare Annual Wellness questionnaire and have noted the following in the patient's chart:  A. Medical and social history B. Use of alcohol, tobacco or illicit drugs  C. Current medications and supplements D. Functional ability and status E.  Nutritional status F.  Physical activity G. Advance directives H. List of other physicians I.  Hospitalizations, surgeries, and ER visits in previous 12 months J.  [place] to include hearing, vision, cognitive, depression L. Referrals and appointments - none  In addition, I have reviewed and discussed with patient certain preventive protocols, quality metrics, and best practice recommendations. A written personalized care plan for preventive services as well  as general preventive health recommendations were provided to patient.  See attached scanned questionnaire for additional information.   Signed,   Tyson Dense, RN Nurse Health Advisor  Patient Concerns: Coughing up phlegm, congestion when laying down starting last Thursday.

## 2017-05-08 NOTE — Patient Instructions (Signed)
START OTC CLARITIN 10MG  DAILY FOR SEASONAL ALLERGY  Use flonase 1 spray each nostril daily  Use saline nasal spray as needed to keep nose moist  Continue other medications as ordered  GET CHEST XRAY AND WILL CALL WITH RESULTS  Will call with lab results  Follow up as scheduled with specialists   Follow up in 3 mos for DM, asthma, CHF, HTN, hyperlipidemia

## 2017-05-08 NOTE — Patient Instructions (Signed)
Henry Carroll , Thank you for taking time to come for your Medicare Wellness Visit. I appreciate your ongoing commitment to your health goals. Please review the following plan we discussed and let me know if I can assist you in the future.   Screening recommendations/referrals: Colonoscopy up to date, due 02/14/2026 Recommended yearly ophthalmology/optometry visit for glaucoma screening and checkup Recommended yearly dental visit for hygiene and checkup  Vaccinations: Influenza vaccine up to date, due 2019 fall season Pneumococcal vaccine up to date, completed Tdap vaccine up to date, due 04/10/2026 Shingles vaccine up to date, completed    Advanced directives: Please bring [place] a copy of your living will  Conditions/risks identified: none  Next appointment: Tyson Dense, RN 05/11/2017 @ 10 am  Preventive Care 65 Years and Older, Male Preventive care refers to lifestyle choices and visits with your health care provider that can promote health and wellness. What does preventive care include?  A yearly physical exam. This is also called an annual well check.  Dental exams once or twice a year.  Routine eye exams. Ask your health care provider how often you should have your eyes checked.  Personal lifestyle choices, including:  Daily care of your teeth and gums.  Regular physical activity.  Eating a healthy diet.  Avoiding tobacco and drug use.  Limiting alcohol use.  Practicing safe sex.  Taking low doses of aspirin every day.  Taking vitamin and mineral supplements as recommended by your health care provider. What happens during an annual well check? The services and screenings done by your health care provider during your annual well check will depend on your age, overall health, lifestyle risk factors, and family history of disease. Counseling  Your health care provider may ask you questions about your:  Alcohol use.  Tobacco use.  Drug use.  Emotional  well-being.  Home and relationship well-being.  Sexual activity.  Eating habits.  History of falls.  Memory and ability to understand (cognition).  Work and work Statistician. Screening  You may have the following tests or measurements:  Height, weight, and BMI.  Blood pressure.  Lipid and cholesterol levels. These may be checked every 5 years, or more frequently if you are over 68 years old.  Skin check.  Lung cancer screening. You may have this screening every year starting at age 82 if you have a 30-pack-year history of smoking and currently smoke or have quit within the past 15 years.  Fecal occult blood test (FOBT) of the stool. You may have this test every year starting at age 25.  Flexible sigmoidoscopy or colonoscopy. You may have a sigmoidoscopy every 5 years or a colonoscopy every 10 years starting at age 37.  Prostate cancer screening. Recommendations will vary depending on your family history and other risks.  Hepatitis C blood test.  Hepatitis B blood test.  Sexually transmitted disease (STD) testing.  Diabetes screening. This is done by checking your blood sugar (glucose) after you have not eaten for a while (fasting). You may have this done every 1-3 years.  Abdominal aortic aneurysm (AAA) screening. You may need this if you are a current or former smoker.  Osteoporosis. You may be screened starting at age 41 if you are at high risk. Talk with your health care provider about your test results, treatment options, and if necessary, the need for more tests. Vaccines  Your health care provider may recommend certain vaccines, such as:  Influenza vaccine. This is recommended every year.  Tetanus,  diphtheria, and acellular pertussis (Tdap, Td) vaccine. You may need a Td booster every 10 years.  Zoster vaccine. You may need this after age 18.  Pneumococcal 13-valent conjugate (PCV13) vaccine. One dose is recommended after age 56.  Pneumococcal  polysaccharide (PPSV23) vaccine. One dose is recommended after age 36. Talk to your health care provider about which screenings and vaccines you need and how often you need them. This information is not intended to replace advice given to you by your health care provider. Make sure you discuss any questions you have with your health care provider. Document Released: 02/25/2015 Document Revised: 10/19/2015 Document Reviewed: 11/30/2014 Elsevier Interactive Patient Education  2017 [place] Prevention in the Home Falls can cause injuries. They can happen to people of all ages. There are many things you can do to make your home safe and to help prevent falls. What can I do on the outside of my home?  Regularly fix the edges of walkways and driveways and fix any cracks.  Remove anything that might make you trip as you walk through a door, such as a raised step or threshold.  Trim any bushes or trees on the path to your home.  Use bright outdoor lighting.  Clear any walking paths of anything that might make someone trip, such as rocks or tools.  Regularly check to see if handrails are loose or broken. Make sure that both sides of any steps have handrails.  Any raised decks and porches should have guardrails on the edges.  Have any leaves, snow, or ice cleared regularly.  Use sand or salt on walking paths during winter.  Clean up any spills in your garage right away. This includes oil or grease spills. What can I do in the bathroom?  Use night lights.  Install grab bars by the toilet and in the tub and shower. Do not use towel bars as grab bars.  Use non-skid mats or decals in the tub or shower.  If you need to sit down in the shower, use a plastic, non-slip stool.  Keep the floor dry. Clean up any water that spills on the floor as soon as it happens.  Remove soap buildup in the tub or shower regularly.  Attach bath mats securely with double-sided non-slip rug  tape.  Do not have throw rugs and other things on the floor that can make you trip. What can I do in the bedroom?  Use night lights.  Make sure that you have a light by your bed that is easy to reach.  Do not use any sheets or blankets that are too big for your bed. They should not hang down onto the floor.  Have a firm chair that has side arms. You can use this for support while you get dressed.  Do not have throw rugs and other things on the floor that can make you trip. What can I do in the kitchen?  Clean up any spills right away.  Avoid walking on wet floors.  Keep items that you use a lot in easy-to-reach places.  If you need to reach something above you, use a strong step stool that has a grab bar.  Keep electrical cords out of the way.  Do not use floor polish or wax that makes floors slippery. If you must use wax, use non-skid floor wax.  Do not have throw rugs and other things on the floor that can make you trip. What can I do with  my stairs?  Do not leave any items on the stairs.  Make sure that there are handrails on both sides of the stairs and use them. Fix handrails that are broken or loose. Make sure that handrails are as long as the stairways.  Check any carpeting to make sure that it is firmly attached to the stairs. Fix any carpet that is loose or worn.  Avoid having throw rugs at the top or bottom of the stairs. If you do have throw rugs, attach them to the floor with carpet tape.  Make sure that you have a light switch at the top of the stairs and the bottom of the stairs. If you do not have them, ask someone to add them for you. What else can I do to help prevent falls?  Wear shoes that:  Do not have high heels.  Have rubber bottoms.  Are comfortable and fit you well.  Are closed at the toe. Do not wear sandals.  If you use a stepladder:  Make sure that it is fully opened. Do not climb a closed stepladder.  Make sure that both sides of the  stepladder are locked into place.  Ask someone to hold it for you, if possible.  Clearly mark and make sure that you can see:  Any grab bars or handrails.  First and last steps.  Where the edge of each step is.  Use tools that help you move around (mobility aids) if they are needed. These include:  Canes.  Walkers.  Scooters.  Crutches.  Turn on the lights when you go into a dark area. Replace any light bulbs as soon as they burn out.  Set up your furniture so you have a clear path. Avoid moving your furniture around.  If any of your floors are uneven, fix them.  If there are any pets around you, be aware of where they are.  Review your medicines with your doctor. Some medicines can make you feel dizzy. This can increase your chance of falling. Ask your doctor what other things that you can do to help prevent falls. This information is not intended to replace advice given to you by your health care provider. Make sure you discuss any questions you have with your health care provider. Document Released: 11/25/2008 Document Revised: 07/07/2015 Document Reviewed: 03/05/2014 Elsevier Interactive Patient Education  2017 [employer].

## 2017-05-08 NOTE — Progress Notes (Signed)
Patient ID: Henry Carroll, male   DOB: [birth year], 67 y.o.   MRN: [id number]   Location:  [facility] OFFICE  Provider: DR Arletha Grippe  Code Status: FULL CODE Goals of Care:  Advanced Directives 05/08/2017  Does Patient Have a Medical Advance Directive? Yes  Type of Advance Directive Living will  Does patient want to make changes to medical advance directive? No - Patient declined  Copy of [place] in Chart? -  Would patient like information on creating a medical advance directive? -     Chief Complaint  Patient presents with  . Medical Management of Chronic Issues    4 month follow-up on DM and hypertension. AWV completed today   . Medication Refill    No refills needed     HPI: Patient is a 67 y.o. male seen today for medical management of chronic diseases.  He c/o of cough when he lays flat. The cough is productive with white sputum. He had similar sx's with HF. He also reports increased fatigue that is not similar to HF sx's. He tried to use neb without relief. He has nasal congestion with lying flat that improves when he sits up. Initially had right earache but that resolved. Prior to onset of earache, he cleaned ears with Q tips. No sinus pressure/pain, HA, dizziness, post nasal drip, f/c. No sick contacts. He is c/a HF. He is not able to play golf or watch the grandchildren.  multivessel CAD - stable. He underwent CABG x 4 vessels by Dr Charlott Rakes on 02/18/15. Vein harvested from right leg. ABIs 0.62 on right and 0.28 on left.  Followed by cardio Dr Marlou Porch. Released from CT surgery. Completed cardiac rehab in July 2017.  Chronic systolic CHF - weight 102 lbs today. He started [facility] in late January 2019. Followed by cardio Dr Marlou Porch. 2D echo revealed EF 35-40% with diffuse hypokinesis and grade 2 DD; mild MR/mild dilated LA; mod TR. He is NYHA class 2. He states he is taking 2 tabs of furosemide BID instead of 1.5 tabs BID.  DM - BS fluctuating at home but usually  <160.  No low BS reactions. He has not needed novolog. No numbness or tingling. Takes prn novolog and levemir. Uses 54 units levemir BID most days. A1c 10.1%; urine microalbumin/Cr ratio 1202; LDL 130. 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 Takes ASA daily. Followed by cardio Dr Marlou Porch  Hyperlipidemia - stable on lipitor. No myalgias. LDL 130  Asthma - mild wheezing on advair and albuterol neb. Takes singulair. He has not used antihistamine or flonase yet  CKD - stage 4. followed by nephrology Dr Posey Pronto [facility]). Cr 2.1  AOCD - Hgb 9.6. In the past, he has been transfused with PRBCs and rec'd iron infusions. Ferritin 121; iron 49. Followed by hematology Dr Talbert Cage  Last albumin 3.4; total protein 6.3   Past Medical History:  Diagnosis Date  . Anemia   . 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 Asthma   . Chronic combined systolic (congestive) and diastolic (congestive) heart failure ([place])    a. 12/31/14: 2D ECHO: EF 40-45%, HK of inf myocardium, G1DD, mod MR  . CKD (chronic kidney disease), stage III (HCC)    stage 3 kidney disease  . Coronary artery disease    a. LHC 01/2015 - triple vessel CAD (mod oLM, mLAD, severe mRCA, intermediate branch stenosis, CTO of mCx). Plan  CABG 02/2015.  Marland Kitchen GERD (gastroesophageal reflux disease)   . Hyperlipidemia   . Hypertension   . Mitral regurgitation    a. Mild-mod by echo 12/2014.  Marland Kitchen Myocardial infarction [facility])    pt. states per [name] he has in the past  . NSVT (nonsustained ventricular tachycardia) ([place])    a. 9 beats during 01/2015 adm. BB titrated.  . Obesity    a. BMI 33  . Type II diabetes mellitus ([place])     Past Surgical History:  Procedure Laterality Date  . BACK SURGERY    . CARDIAC CATHETERIZATION N/A 02/09/2015   Procedure: Left Heart Cath and Coronary Angiography;  Surgeon: Burnell Blanks, MD;  Location: [place] [facility];  Service: Cardiovascular;  Laterality:  N/A;  . COLONOSCOPY  2011   Dr. [name]: Ileocecal valve appeared normal, scattered pancolonic diverticulosis, difficult bowel prep making smaller lesions potentially missed. Recommended three-year follow-up colonoscopy.  . COLONOSCOPY  2003   Dr. [name]: Suspicious lesion at the ileocecal valve, multiple biopsies benign, pancolonic diverticulosis.  . COLONOSCOPY N/A 02/15/2016   diverticulosis in sigmoid and descending colon, single 5 mm polyp at splenic flexure (Tubular adenoma)  . CORONARY ARTERY BYPASS GRAFT N/A 02/18/2015   Procedure: CORONARY ARTERY BYPASS GRAFTING (CABG) x  four, using bilateral internal mammary arteries and right leg greater saphenous vein harvested endoscopically;  Surgeon: [name], MD;  Location: [place] OR;  Service: Open Heart Surgery;  Laterality: N/A;  . ESOPHAGOGASTRODUODENOSCOPY N/A 02/15/2016   normal  . GIVENS CAPSULE STUDY N/A 01/08/2017   couple of gastric and small bowel eroions in setting of aspirin 81 mg daily but nothing concerning, continue Hematology follow-up  . HERNIA REPAIR  3536   Umbilical  . LUMBAR [place] SURGERY  2006   L4 & L5  . TEE WITHOUT CARDIOVERSION N/A 02/18/2015   Procedure: TRANSESOPHAGEAL ECHOCARDIOGRAM (TEE);  Surgeon: [name], MD;  Location: [place];  Service: Open Heart Surgery;  Laterality: N/A;  . TONSILLECTOMY  1962     reports that he quit smoking about 2 years ago. His smoking use included cigars. He quit after 17.00 years of use. He has never used smokeless tobacco. He reports that he drinks alcohol. He reports that he does not use drugs. Social History   Socioeconomic History  . Marital status: Married    Spouse name: Not on file  . Number of children: Not on file  . Years of education: Not on file  . Highest education level: Not on file  Occupational History  . Not on file  Social Needs  . Financial resource strain: Not hard at all  . Food insecurity:    Worry: Never true    Inability: Never true  . Transportation  needs:    Medical: No    Non-medical: No  Tobacco Use  . Smoking status: Former Smoker    Years: 17.00    Types: Cigars    Last attempt to quit: 12/31/2014    Years since quitting: 2.3  . Smokeless tobacco: Never Used  Substance and Sexual Activity  . Alcohol use: Yes    Alcohol/week: 0.0 oz    Comment: occasional glass of wine.  . Drug use: No  . Sexual activity: Yes    Birth control/protection: None  Lifestyle  . Physical activity:    Days per week: 5 days    Minutes per session: 30 min  . Stress: Only a little  Relationships  . Social connections:  Talks on phone: More than three times a week    Gets together: More than three times a week    Attends religious service: More than 4 times per year    Active member of club or organization: Yes    Attends meetings of clubs or organizations: 1 to 4 times per year    Relationship status: Married  . Intimate partner violence:    Fear of current or ex partner: No    Emotionally abused: No    Physically abused: No    Forced sexual activity: No  Other Topics Concern  . Not on file  Social History Narrative  . Not on file    Family History  Problem Relation Age of Onset  . Diabetes Mother   . Heart disease Mother        later in life, age >20  . Heart disease Father        heart failure later in life  . Hypertension Father   . Stroke Sister   . Kidney disease Daughter   . Sudden death Daughter   . Colon cancer Paternal Uncle        Passed from colon CA  . Heart attack Neg Hx     No Known Allergies  Outpatient Encounter Medications as of 05/08/2017  Medication Sig  . acetaminophen (TYLENOL) 500 MG tablet Take 1,000 mg by mouth 2 (two) times daily as needed for moderate pain.  Marland Kitchen albuterol (PROVENTIL) (2.5 MG/3ML) 0.083% nebulizer solution USE ONE VIAL (3 ML) IN NEBULIZER EVERY 6 HOURS AS NEEDED FOR WHEEZING OR SHORTNESS OF BREATH. Dx:J45.21, R06.2  . amLODipine (NORVASC) 10 MG tablet TAKE 1 TABLET BY MOUTH ONCE  DAILY  . aspirin EC 81 MG tablet Take 1 tablet (81 mg total) by mouth daily.  Marland Kitchen atorvastatin (LIPITOR) 40 MG tablet Take 1 tablet (40 mg total) by mouth every evening.  . carvedilol (COREG) 12.5 MG tablet TAKE ONE TABLET BY MOUTH TWICE DAILY WITH A MEAL  . Cholecalciferol ([employer] VITAMIN D3) 1000 UNITS capsule Take 2 capsules (2,000 Units total) by mouth daily.  Marland Kitchen CINNAMON PO Take 1,000 mg by mouth 2 (two) times daily.   . Coenzyme Q10 (CO Q 10 PO) Take 200 mg by mouth daily.   . fluticasone (FLONASE) 50 MCG/ACT nasal spray Place 2 sprays into both nostrils daily as needed for allergies or rhinitis (SEASONAL ALLERGIES).  . Fluticasone-Salmeterol (ADVAIR DISKUS) 100-50 MCG/DOSE AEPB Inhale 1 puff into the lungs 2 (two) times daily.  . furosemide (LASIX) 40 MG tablet Take 1.5 tablets (60 mg total) by mouth 2 (two) times daily.  Marland Kitchen glucose blood (ONE TOUCH ULTRA TEST) test strip Use one strip to check glucose three times daily. Dx: E11.29  . hydrALAZINE (APRESOLINE) 25 MG tablet TAKE ONE TABLET BY MOUTH TWICE DAILY  . insulin aspart (NOVOLOG FLEXPEN) 100 UNIT/ML FlexPen INJECT 5 UNITS SUBCUTANEOUSLYIF BLOOD SUGAR IS ABOVE 250, 10 UNITS IF ABOVE 300.  Marland Kitchen LEVEMIR 100 UNIT/ML injection INJECT 0.54MLS (54 UNITS TOTAL) INTO THE SKIN TWICE DAILY  . montelukast (SINGULAIR) 10 MG tablet TAKE 1 TABLET BY MOUTH ONCE DAILY AT BEDTIME  . Multiple Vitamins-Minerals (ONE-A-DAY MENS 50+ ADVANTAGE) TABS Take 1 tablet by mouth daily.  . nitroGLYCERIN (NITROSTAT) 0.4 MG SL tablet Place 1 tablet (0.4 mg total) under the tongue every 5 (five) minutes as needed for chest pain.  Marland Kitchen PROAIR HFA 108 (90 Base) MCG/ACT inhaler INHALE 2 PUFFS BY MOUTH EVERY 6 HOURS AS NEEDED FOR  SHORTNESS OF BREATH  . sacubitril-valsartan (ENTRESTO) 24-26 MG Take 1 tablet by mouth 2 (two) times daily.  Marland Kitchen terbinafine (LAMISIL) 1 % cream Apply 1 application topically daily as needed (foot).    Facility-Administered Encounter Medications as of  05/08/2017  Medication  . regadenoson (LEXISCAN) injection SOLN 0.4 mg    Review of Systems:  Review of Systems  Constitutional: Positive for fatigue.  HENT: Positive for congestion.   Respiratory: Positive for cough and wheezing.   All other systems reviewed and are negative.   Health Maintenance  Topic Date Due  . Hepatitis C Screening  02/12/2018 (Originally January 04, 1951)  . OPHTHALMOLOGY EXAM  06/19/2017  . HEMOGLOBIN A1C  07/09/2017  . URINE MICROALBUMIN  01/10/2018  . TETANUS/TDAP  03/25/2021  . COLONOSCOPY  02/14/2026  . INFLUENZA VACCINE  Completed  . PNA vac Low Risk Adult  Completed    Physical Exam: Vitals:   05/08/17 0845  BP: 118/64  Pulse: 73  Temp: 98 F (36.7 C)  TempSrc: Oral  SpO2: 97%  Weight: 226 lb (102.5 kg)  Height: '6\' 1"'$  (1.854 m)   Body mass index is 29.82 kg/m. Physical Exam  Constitutional: He is oriented to person, place, and time. He appears well-developed and well-nourished.  HENT:  Mouth/Throat: Oropharynx is clear and moist.  MMM; no oral thrush; no sinus TTP; oropharynx cobblestoning and redness but no exudate  Eyes: Pupils are equal, round, and reactive to light. No scleral icterus.  Neck: Neck supple. Carotid bruit is not present. No thyromegaly present.  Cardiovascular: Normal rate, regular rhythm and intact distal pulses. Exam reveals no gallop and no friction rub.  Murmur (1/6 SEM) heard. Trace LE edema b/l. No calf TTP  Pulmonary/Chest: Effort normal. He has wheezes (end expiratory with prolonged expiratory phase). He has no rales. He exhibits no tenderness.  Abdominal: Soft. Normal appearance and bowel sounds are normal. He exhibits no distension, no abdominal bruit, no pulsatile midline mass and no mass. There is no hepatomegaly. There is no tenderness. There is no rigidity, no rebound and no guarding. No hernia.  obese  Musculoskeletal: He exhibits edema.  Lymphadenopathy:    He has no cervical adenopathy.  Neurological: He  is alert and oriented to person, place, and time.  Skin: Skin is warm and dry. No rash noted.  Psychiatric: He has a normal mood and affect. His behavior is normal. Judgment and thought content normal.   Diabetic Foot Exam - Simple   Simple Foot Form Diabetic Foot exam was performed with the following findings:  Yes 05/08/2017  9:32 AM  Visual Inspection No deformities, no ulcerations, no other skin breakdown bilaterally:  Yes Sensation Testing Intact to touch and monofilament testing bilaterally:  Yes Pulse Check Posterior Tibialis and Dorsalis pulse intact bilaterally:  Yes Comments      Labs reviewed: Basic Metabolic Panel: Recent Labs    11/22/16 1400 12/25/16 1527 03/07/17 1035  NA 134* 146* 136  K 4.7 5.2 4.3  CL 100* 108* 101  CO2 '25 25 25  '$ GLUCOSE 387* 47* 112*  BUN 52* 45* 54*  CREATININE 2.35* 2.51* 2.49*  CALCIUM 8.6* 9.0 9.0   Liver Function Tests: Recent Labs    11/07/16 1530 11/22/16 1400 03/07/17 1035  AST 38 24 21  ALT '28 22 17  '$ ALKPHOS 74 67 61  BILITOT <0.2 0.5 0.3  PROT 5.7* 5.9* 6.3*  ALBUMIN 3.4* 3.1* 3.4*   No results for input(s): LIPASE, AMYLASE in the last 8760 hours.  No results for input(s): AMMONIA in the last 8760 hours. CBC: Recent Labs    11/15/16 0815  11/22/16 1400 12/25/16 1527 03/07/17 1035  WBC 4.5   < > 3.1* 4.0 3.5*  NEUTROABS 2.5  --  1.8  --  1.8  HGB 8.1*   < > 8.6* 9.8* 9.6*  HCT 24.0*   < > 25.3* 30.1* 28.5*  MCV 95   < > 95.5 96 93.1  PLT 329   < > 238 283 269   < > = values in this interval not displayed.   Lipid Panel: Recent Labs    09/26/16 0903 01/09/17 1148  CHOL 222* 195  HDL 59 44  LDLCALC 130* 122*  TRIG 164* 177*  CHOLHDL 3.8 4.4   Lab Results  Component Value Date   HGBA1C 7.4 (H) 01/09/2017    Procedures since last visit: No results found.  Assessment/Plan   ICD-10-CM   1. Seasonal allergies J30.2   2. Asthma, chronic, mild intermittent, uncomplicated T95.39   3. Uncontrolled  type 2 diabetes mellitus with diabetic nephropathy, without long-term current use of insulin (HCC) E11.21 Hemoglobin A1c   E11.65   4. Hyperlipidemia with target LDL less than 100 E78.5 Lipid Panel  5. CKD (chronic kidney disease) stage 3, GFR 30-59 ml/min (HCC) N18.3   6. Essential hypertension I10   7. Chronic systolic heart failure (HCC) I50.22 DG Chest 2 View  8. High risk medication use Z79.899 BMP with eGFR([facility])   START OTC CLARITIN '10MG'$  DAILY FOR SEASONAL ALLERGY  Use flonase 1 spray each nostril daily  Use saline nasal spray as needed to keep nose moist  Continue other medications as ordered  GET CHEST XRAY AND WILL CALL WITH RESULTS  Will call with lab results  Follow up as scheduled with specialists   Follow up in 3 mos for DM, asthma, CHF, HTN, hyperlipidemia    Anjulie Dipierro S. Perlie Gold  [facility] [address] [place],  [address] [phone] Cell (Monday-Friday 8 AM - 5 PM) [phone] After 5 PM and follow prompts

## 2017-05-09 ENCOUNTER — Telehealth: Payer: Self-pay

## 2017-05-09 DIAGNOSIS — E1165 Type 2 diabetes mellitus with hyperglycemia: Principal | ICD-10-CM

## 2017-05-09 DIAGNOSIS — IMO0002 Reserved for concepts with insufficient information to code with codable children: Secondary | ICD-10-CM

## 2017-05-09 DIAGNOSIS — E1121 Type 2 diabetes mellitus with diabetic nephropathy: Secondary | ICD-10-CM

## 2017-05-09 LAB — BASIC METABOLIC PANEL WITH GFR
BUN/Creatinine Ratio: 22 (calc) (ref 6–22)
BUN: 58 mg/dL — ABNORMAL HIGH (ref 7–25)
CALCIUM: 9 mg/dL (ref 8.6–10.3)
CO2: 30 mmol/L (ref 20–32)
Chloride: 100 mmol/L (ref 98–110)
Creat: 2.61 mg/dL — ABNORMAL HIGH (ref 0.70–1.25)
GFR, EST NON AFRICAN AMERICAN: 24 mL/min/{1.73_m2} — AB (ref >=60)
GFR, Est African American: 28 mL/min/{1.73_m2} — ABNORMAL LOW (ref >=60)
Glucose, Bld: 146 mg/dL — ABNORMAL HIGH (ref 65–99)
POTASSIUM: 4.2 mmol/L (ref 3.5–5.3)
Sodium: 138 mmol/L (ref 135–146)

## 2017-05-09 LAB — LIPID PANEL
CHOL/HDL RATIO: 4.1 (calc) (ref <5.0)
CHOLESTEROL: 144 mg/dL (ref <200)
HDL: 35 mg/dL — ABNORMAL LOW (ref >40)
LDL Cholesterol (Calc): 85 mg/dL (calc)
Non-HDL Cholesterol (Calc): 109 mg/dL (calc) (ref <130)
Triglycerides: 137 mg/dL (ref <150)

## 2017-05-09 LAB — HEMOGLOBIN A1C
EAG (MMOL/L): 9.5 (calc)
HEMOGLOBIN A1C: 7.6 %{Hb} — AB (ref <5.7)
MEAN PLASMA GLUCOSE: 171 (calc)

## 2017-05-09 NOTE — Telephone Encounter (Signed)
Discussed results with patient, patient verbalized understanding of results. Medication list updated to reflect change in Levemir

## 2017-05-09 NOTE — Telephone Encounter (Signed)
-----   Message from [place], [place] sent at 05/09/2017 10:57 AM EDT ----- Kidney function is reduced more than usual - recommend you contact cardiology about your fluid pill medication; cholesterol improved; blood sugar is worsening so watch complex carbs and increase Levemir to 56 units injection daily; follow up as scheduled

## 2017-05-10 ENCOUNTER — Other Ambulatory Visit (HOSPITAL_COMMUNITY): Payer: Self-pay | Admitting: *Deleted

## 2017-05-10 DIAGNOSIS — D509 Iron deficiency anemia, unspecified: Secondary | ICD-10-CM

## 2017-05-13 ENCOUNTER — Encounter (HOSPITAL_COMMUNITY): Payer: Self-pay | Admitting: Internal Medicine

## 2017-05-13 ENCOUNTER — Inpatient Hospital Stay (HOSPITAL_COMMUNITY): Payer: Medicare Other | Attending: Hematology | Admitting: Internal Medicine

## 2017-05-13 ENCOUNTER — Other Ambulatory Visit: Payer: Self-pay

## 2017-05-13 ENCOUNTER — Inpatient Hospital Stay (HOSPITAL_COMMUNITY): Payer: Medicare Other

## 2017-05-13 VITALS — BP 143/70 | HR 64 | Temp 97.6°F | Resp 18 | Wt 227.0 lb

## 2017-05-13 DIAGNOSIS — I130 Hypertensive heart and chronic kidney disease with heart failure and stage 1 through stage 4 chronic kidney disease, or unspecified chronic kidney disease: Secondary | ICD-10-CM | POA: Insufficient documentation

## 2017-05-13 DIAGNOSIS — D509 Iron deficiency anemia, unspecified: Secondary | ICD-10-CM

## 2017-05-13 DIAGNOSIS — I5042 Chronic combined systolic (congestive) and diastolic (congestive) heart failure: Secondary | ICD-10-CM | POA: Diagnosis not present

## 2017-05-13 DIAGNOSIS — D472 Monoclonal gammopathy: Secondary | ICD-10-CM | POA: Insufficient documentation

## 2017-05-13 DIAGNOSIS — D631 Anemia in chronic kidney disease: Secondary | ICD-10-CM | POA: Insufficient documentation

## 2017-05-13 DIAGNOSIS — N183 Chronic kidney disease, stage 3 (moderate): Secondary | ICD-10-CM | POA: Insufficient documentation

## 2017-05-13 DIAGNOSIS — E1122 Type 2 diabetes mellitus with diabetic chronic kidney disease: Secondary | ICD-10-CM

## 2017-05-13 LAB — COMPREHENSIVE METABOLIC PANEL WITH GFR
ALT: 19 U/L (ref 17–63)
AST: 21 U/L (ref 15–41)
Albumin: 3.4 g/dL — ABNORMAL LOW (ref 3.5–5.0)
Alkaline Phosphatase: 68 U/L (ref 38–126)
Anion gap: 9 (ref 5–15)
BUN: 55 mg/dL — ABNORMAL HIGH (ref 6–20)
CO2: 26 mmol/L (ref 22–32)
Calcium: 9.5 mg/dL (ref 8.9–10.3)
Chloride: 103 mmol/L (ref 101–111)
Creatinine, Ser: 2.42 mg/dL — ABNORMAL HIGH (ref 0.61–1.24)
GFR calc Af Amer: 30 mL/min — ABNORMAL LOW
GFR calc non Af Amer: 26 mL/min — ABNORMAL LOW
Glucose, Bld: 135 mg/dL — ABNORMAL HIGH (ref 65–99)
Potassium: 4.9 mmol/L (ref 3.5–5.1)
Sodium: 138 mmol/L (ref 135–145)
Total Bilirubin: 0.5 mg/dL (ref 0.3–1.2)
Total Protein: 6.7 g/dL (ref 6.5–8.1)

## 2017-05-13 LAB — CBC WITH DIFFERENTIAL/PLATELET
Basophils Absolute: 0 K/uL (ref 0.0–0.1)
Basophils Relative: 0 %
Eosinophils Absolute: 0.2 K/uL (ref 0.0–0.7)
Eosinophils Relative: 3 %
HCT: 30.9 % — ABNORMAL LOW (ref 39.0–52.0)
Hemoglobin: 10.5 g/dL — ABNORMAL LOW (ref 13.0–17.0)
Lymphocytes Relative: 21 %
Lymphs Abs: 1 K/uL (ref 0.7–4.0)
MCH: 32.1 pg (ref 26.0–34.0)
MCHC: 34 g/dL (ref 30.0–36.0)
MCV: 94.5 fL (ref 78.0–100.0)
Monocytes Absolute: 0.4 K/uL (ref 0.1–1.0)
Monocytes Relative: 8 %
Neutro Abs: 3.2 K/uL (ref 1.7–7.7)
Neutrophils Relative %: 68 %
Platelets: 288 K/uL (ref 150–400)
RBC: 3.27 MIL/uL — ABNORMAL LOW (ref 4.22–5.81)
RDW: 11.3 % — ABNORMAL LOW (ref 11.5–15.5)
WBC: 4.8 K/uL (ref 4.0–10.5)

## 2017-05-13 LAB — IRON AND TIBC
Iron: 67 ug/dL (ref 45–182)
Saturation Ratios: 23 % (ref 17.9–39.5)
TIBC: 290 ug/dL (ref 250–450)
UIBC: 223 ug/dL

## 2017-05-13 LAB — FERRITIN: Ferritin: 212 ng/mL (ref 24–336)

## 2017-05-13 NOTE — Progress Notes (Signed)
Diagnosis MGUS (monoclonal gammopathy of unknown significance) - Plan: CBC with Differential/Platelet, Comprehensive metabolic panel, Ferritin, Lactate dehydrogenase, Protein electrophoresis, serum, Kappa/lambda light chains, Beta 2 microglobuline, serum, IgG, IgA, IgM, DG Bone Survey Met  Staging Cancer Staging No matching staging information was found for the patient.  Assessment and Plan:  1. Normocytic anemia.  Pt was previously  followed by Dr. Talbert Cage.  Anemia was felt to be due to underlying anemia of chronic renal disease. Labs done 05/13/2017 shows Cr. 2.42, Hb 10.5, WBC 4.8 and plts 288,000.  Ferritin in 02/2017 was 121.  He will RTC in 4 months for repeat labs.  May have to consider Procrit if HB decreased < 10g/dl as pt has been treated with IV iron 02/2017.    2. MGUS.  He had SPEP done 02/2017 that showed an M spike in the gamma region measuring 0.3 g/dl.  This was stable with SPEP done 11/2016.  Hb is 10.5, Cr 2.42, Ca++ normal at 9.5, FLC ratio 2.04.   He has preserved quantitative IG.  The patient will have repeat labs done in 4 months and will have skeletal survey done at that time.  He will RTC 09/2017 for follow-up to go over labs and xrays.     3.  CKD.  He reports he is scheduled to see Dr. Posey Pronto in May 2019.  Cr done 05/13/2017 was 2.42.    4.  Fatigue.  HB 10.5.  He has been treated with IV iron in the past.  Recent Ferritin > 100.  If symptoms continue will check thyroid if not previously done   Interval History:  67 y.o. male previously seen by Dr. Talbert Cage for evaluation of normocytic anemia. Patient has history of CKD stage III. Most recent lab work from 11/20/2016 demonstrated a WBC 3.7K, hemoglobin 8.8 g/dL, hematocrit 26.4%, platelet count 297K, creatinine 2.1. Review of his lab work from the past year demonstrate that his hemoglobin has ranged in the 8-10 g/dL range. His most recent iron studies from 09/26/2016 demonstrated serum iron 17, TIBC 309, percent saturation 6%, ferritin  43. Patient denies any recent melena, hematochezia, hematuria, hemoptysis. He states that he did receive IV iron infusions in the past, and per records he did receive 2 doses ferriheme in December 2017. Currently not on any oral iron. Otherwise he states that overall she feels well except for some fatigue. He has occasional shortness of breath with exertion. He has swelling in his feet. Otherwise he denies any fevers or chills, dysphagia, chest pain, abdominal pain, nausea vomiting, diarrhea, constipation.  Last GI workup was on 02/15/16. He had a colonoscopy which demonstrated diverticulosis in the sigmoid colon and in the descending colon. One 5 mm polyp at the splenic flexure, removed with a cold snare. Resected and retrieved. The examination was otherwise normal on direct and retroflexion views. No findings in either upper or lower GI tract to explain anemia via a mechanism of GI bleeding. EGD on 02/15/16 demonstrated Normal esophagus. Normal stomach. Normal duodenal bulb and second portion of the duodenum. Patient has also undergone capsule endoscopy.  Current Status:  Pt is seen today for follow-up.  He is complaining of ongoing fatigue.  When questioned, he reports he will see Dr. Posey Pronto of nephrology in May 2019.    Problem List Patient Active Problem List   Diagnosis Date Noted  . Small bowel lesion [K63.9]   . Gastric erosion [K25.9]   . Iron deficiency anemia [D50.9] 12/10/2016  . IDA (iron deficiency anemia) [  D50.9] 01/23/2016  . Melena [K92.1] 01/23/2016  . 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 Bilateral lower extremity edema [R60.0] 12/22/2015  . Asthma, chronic, mild intermittent, with acute exacerbation [J45.21] 12/22/2015  . PAD (peripheral artery disease) ([place]) [I73.9] 03/02/2015  . Chronic systolic heart failure ([place]) [I50.22] 02/25/2015  . Hypertensive heart disease with heart failure ([place]) [I11.0] 02/25/2015  . CAD (coronary artery disease) [I25.10] 02/18/2015  .  CAD in native artery [I25.10] 02/10/2015  . Abnormal stress test [R94.39] 02/10/2015  . Mitral regurgitation [I34.0] 02/10/2015  . Chronic combined systolic and diastolic CHF (congestive heart failure) ([place]) [I50.42] 02/10/2015  . NSVT (nonsustained ventricular tachycardia) ([place]) [I47.2] 02/10/2015  . Acute on chronic systolic and diastolic heart failure, NYHA class 1 ([place]) [I50.43] 12/31/2014  . Hyperlipidemia with target LDL less than 100 [E78.5] 12/31/2014  . Demand ischemia ([place]) [I24.8] 12/31/2014  . Morbid obesity ([place]) [E66.01] 12/29/2014  . Elevated troponin [R74.8]   . Essential hypertension [I10] 10/09/2013  . 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 Obesity (BMI 30-39.9) [E66.9] 05/05/2013  . CKD (chronic kidney disease) stage 3, GFR 30-59 ml/min (HCC) [N18.3] 02/18/2013  . Folliculitis of perineum [L73.9] 10/01/2012  . Anemia in chronic kidney disease [N18.9, D63.1] 09/03/2012  . Other malaise and fatigue [R53.81, R53.83] 09/03/2012  . 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 Asthma, chronic [J45.909] 06/04/2012  . COLONIC POLYPS, HX OF [Z86.010] 04/13/2009    Past Medical History Past Medical History:  Diagnosis Date  . Anemia   . 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 Asthma   . Chronic combined systolic (congestive) and diastolic (congestive) heart failure ([place])    a. 12/31/14: 2D ECHO: EF 40-45%, HK of inf myocardium, G1DD, mod MR  . CKD (chronic kidney disease), stage III (HCC)    stage 3 kidney disease  . Coronary artery disease    a. LHC 01/2015 - triple vessel CAD (mod oLM, mLAD, severe mRCA, intermediate branch stenosis, CTO of mCx). Plan CABG 02/2015.  Marland Kitchen GERD (gastroesophageal reflux disease)   . Hyperlipidemia   . Hypertension   . Mitral regurgitation    a. Mild-mod by echo 12/2014.  Marland Kitchen Myocardial infarction [facility])    pt. states per [name] he has in the past  . NSVT (nonsustained ventricular  tachycardia) ([place])    a. 9 beats during 01/2015 adm. BB titrated.  . Obesity    a. BMI 33  . Type II diabetes mellitus ([place])     Past Surgical History Past Surgical History:  Procedure Laterality Date  . BACK SURGERY    . CARDIAC CATHETERIZATION N/A 02/09/2015   Procedure: Left Heart Cath and Coronary Angiography;  Surgeon: Burnell Blanks, MD;  Location: [facility];  Service: Cardiovascular;  Laterality: N/A;  . COLONOSCOPY  2011   Dr. [name]: Ileocecal valve appeared normal, scattered pancolonic diverticulosis, difficult bowel prep making smaller lesions potentially missed. Recommended three-year follow-up colonoscopy.  . COLONOSCOPY  2003   Dr. [name]: Suspicious lesion at the ileocecal valve, multiple biopsies benign, pancolonic diverticulosis.  . COLONOSCOPY N/A 02/15/2016   diverticulosis in sigmoid and descending colon, single 5 mm polyp at splenic flexure (Tubular adenoma)  . CORONARY ARTERY BYPASS GRAFT N/A 02/18/2015   Procedure: CORONARY ARTERY BYPASS GRAFTING (CABG) x  four, using bilateral internal mammary arteries and right leg greater saphenous vein harvested endoscopically;  Surgeon: [name], MD;  Location: [place] OR;  Service: Open Heart  Surgery;  Laterality: N/A;  . ESOPHAGOGASTRODUODENOSCOPY N/A 02/15/2016   normal  . GIVENS CAPSULE STUDY N/A 01/08/2017   couple of gastric and small bowel eroions in setting of aspirin 81 mg daily but nothing concerning, continue Hematology follow-up  . HERNIA REPAIR  [id number]   Umbilical  . LUMBAR [place] SURGERY  2006   L4 & L5  . TEE WITHOUT CARDIOVERSION N/A 02/18/2015   Procedure: TRANSESOPHAGEAL ECHOCARDIOGRAM (TEE);  Surgeon: [name], MD;  Location: [facility];  Service: Open Heart Surgery;  Laterality: N/A;  . TONSILLECTOMY  1962    Family History Family History  Problem Relation Age of Onset  . Diabetes Mother   . Heart disease Mother        later in life, age >71  . Heart disease Father        heart failure  later in life  . Hypertension Father   . Stroke Sister   . Kidney disease Daughter   . Sudden death Daughter   . Colon cancer Paternal Uncle        Passed from colon CA  . Heart attack Neg Hx      Social History  reports that he quit smoking about 2 years ago. His smoking use included cigars. He quit after 17.00 years of use. He has never used smokeless tobacco. He reports that he drinks alcohol. He reports that he does not use drugs.  Medications  Current Outpatient Medications:  .  acetaminophen (TYLENOL) 500 MG tablet, Take 1,000 mg by mouth 2 (two) times daily as needed for moderate pain., Disp: , Rfl:  .  albuterol (PROVENTIL) (2.5 MG/3ML) 0.083% nebulizer solution, USE ONE VIAL (3 ML) IN NEBULIZER EVERY 6 HOURS AS NEEDED FOR WHEEZING OR SHORTNESS OF BREATH. Dx:J45.21, R06.2, Disp: 300 mL, Rfl: 11 .  amLODipine (NORVASC) 10 MG tablet, TAKE 1 TABLET BY MOUTH ONCE DAILY, Disp: 90 tablet, Rfl: 1 .  aspirin EC 81 MG tablet, Take 1 tablet (81 mg total) by mouth daily., Disp: 90 tablet, Rfl: 3 .  atorvastatin (LIPITOR) 40 MG tablet, TAKE 1 TABLET BY MOUTH EVERY EVENING, Disp: 90 tablet, Rfl: 3 .  carvedilol (COREG) 12.5 MG tablet, TAKE ONE TABLET BY MOUTH TWICE DAILY WITH A MEAL, Disp: 60 tablet, Rfl: 8 .  Cholecalciferol (CVS VITAMIN D3) 1000 UNITS capsule, Take 2 capsules (2,000 Units total) by mouth daily., Disp: 60 capsule, Rfl: 3 .  CINNAMON PO, Take 1,000 mg by mouth 2 (two) times daily. , Disp: , Rfl:  .  Coenzyme Q10 (CO Q 10 PO), Take 200 mg by mouth daily. , Disp: , Rfl:  .  fluticasone (FLONASE) 50 MCG/ACT nasal spray, Place 2 sprays into both nostrils daily as needed for allergies or rhinitis (SEASONAL ALLERGIES)., Disp: , Rfl:  .  Fluticasone-Salmeterol (ADVAIR DISKUS) 100-50 MCG/DOSE AEPB, Inhale 1 puff into the lungs 2 (two) times daily., Disp: 60 each, Rfl: 3 .  furosemide (LASIX) 40 MG tablet, Take 1.5 tablets (60 mg total) by mouth 2 (two) times daily., Disp: 270 tablet,  Rfl: 3 .  glucose blood (ONE TOUCH ULTRA TEST) test strip, Use one strip to check glucose three times daily. Dx: E11.29, Disp: 100 each, Rfl: 11 .  hydrALAZINE (APRESOLINE) 25 MG tablet, TAKE ONE TABLET BY MOUTH TWICE DAILY, Disp: 180 tablet, Rfl: 3 .  insulin aspart (NOVOLOG FLEXPEN) 100 UNIT/ML FlexPen, INJECT 5 UNITS SUBCUTANEOUSLYIF BLOOD SUGAR IS ABOVE 250, 10 UNITS IF ABOVE 300., Disp: 15 pen, Rfl: 3 .  insulin detemir (LEVEMIR) 100 UNIT/ML injection, Inject 0.56 mLs (56 Units total) into the skin daily. E11.65, Disp: 60 mL, Rfl: 6 .  montelukast (SINGULAIR) 10 MG tablet, TAKE 1 TABLET BY MOUTH ONCE DAILY AT BEDTIME, Disp: 90 tablet, Rfl: 1 .  Multiple Vitamins-Minerals (ONE-A-DAY MENS 50+ ADVANTAGE) TABS, Take 1 tablet by mouth daily., Disp: , Rfl:  .  nitroGLYCERIN (NITROSTAT) 0.4 MG SL tablet, Place 1 tablet (0.4 mg total) under the tongue every 5 (five) minutes as needed for chest pain., Disp: 25 tablet, Rfl: 3 .  PROAIR HFA 108 (90 Base) MCG/ACT inhaler, INHALE 2 PUFFS BY MOUTH EVERY 6 HOURS AS NEEDED FOR SHORTNESS OF BREATH, Disp: 27 each, Rfl: 2 .  sacubitril-valsartan (ENTRESTO) 24-26 MG, Take 1 tablet by mouth 2 (two) times daily., Disp: 60 tablet, Rfl: 6 .  terbinafine (LAMISIL) 1 % cream, Apply 1 application topically daily as needed (foot). , Disp: , Rfl:  No current facility-administered medications for this visit.   Facility-Administered Medications Ordered in Other Visits:  .  regadenoson (LEXISCAN) injection SOLN 0.4 mg, 0.4 mg, Intravenous, Once, Skains, Thana Farr, MD  Allergies Patient has no known allergies.  Review of Systems Review of Systems - Oncology ROS as per HPI otherwise 12 point ROS is negative.   Physical Exam  Vitals Wt Readings from Last 3 Encounters:  05/13/17 227 lb (103 kg)  05/08/17 226 lb (102.5 kg)  05/08/17 226 lb (102.5 kg)   Temp Readings from Last 3 Encounters:  05/13/17 97.6 F (36.4 C) (Oral)  05/08/17 98 F (36.7 C) (Oral)  05/08/17  98 F (36.7 C) (Oral)   BP Readings from Last 3 Encounters:  05/13/17 (!) 143/70  05/08/17 118/64  05/08/17 118/64   Pulse Readings from Last 3 Encounters:  05/13/17 64  05/08/17 73  05/08/17 73   Constitutional: Well-developed, well-nourished, and in no distress.   HENT: Head: Normocephalic and atraumatic.  Mouth/Throat: No oropharyngeal exudate. Mucosa moist. Eyes: Pupils are equal, round, and reactive to light. Conjunctivae are normal. No scleral icterus.  Neck: Normal range of motion. Neck supple. No JVD present.  Cardiovascular: Normal rate, regular rhythm and normal heart sounds.  Exam reveals no gallop and no friction rub.   No murmur heard. Pulmonary/Chest: Effort normal and breath sounds normal. No respiratory distress. No wheezes.No rales.  Abdominal: Soft. Bowel sounds are normal. No distension. There is no tenderness. There is no guarding.  Musculoskeletal: No edema or tenderness.  Lymphadenopathy: No cervical, axillary  or supraclavicular adenopathy.  Neurological: Alert and oriented to person, place, and time. No cranial nerve deficit.  Skin: Skin is warm and dry. No rash noted. No erythema. No pallor.  Psychiatric: Affect and judgment normal.   Labs Appointment on 05/13/2017  Component Date Value Ref Range Status  . WBC 05/13/2017 4.8  4.0 - 10.5 K/uL Final  . RBC 05/13/2017 3.27* 4.22 - 5.81 MIL/uL Final  . Hemoglobin 05/13/2017 10.5* 13.0 - 17.0 g/dL Final  . HCT 05/13/2017 30.9* 39.0 - 52.0 % Final  . MCV 05/13/2017 94.5  78.0 - 100.0 fL Final  . MCH 05/13/2017 32.1  26.0 - 34.0 pg Final  . MCHC 05/13/2017 34.0  30.0 - 36.0 g/dL Final  . RDW 05/13/2017 11.3* 11.5 - 15.5 % Final  . Platelets 05/13/2017 288  150 - 400 K/uL Final  . Neutrophils Relative % 05/13/2017 68  % Final  . Neutro Abs 05/13/2017 3.2  1.7 - 7.7 K/uL Final  . Lymphocytes Relative  05/13/2017 21  % Final  . Lymphs Abs 05/13/2017 1.0  0.7 - 4.0 K/uL Final  . Monocytes Relative 05/13/2017 8   % Final  . Monocytes Absolute 05/13/2017 0.4  0.1 - 1.0 K/uL Final  . Eosinophils Relative 05/13/2017 3  % Final  . Eosinophils Absolute 05/13/2017 0.2  0.0 - 0.7 K/uL Final  . Basophils Relative 05/13/2017 0  % Final  . Basophils Absolute 05/13/2017 0.0  0.0 - 0.1 K/uL Final   Performed at [facility], [address], [place], [place] [address]  . Sodium 05/13/2017 138  135 - 145 mmol/L Final  . Potassium 05/13/2017 4.9  3.5 - 5.1 mmol/L Final  . Chloride 05/13/2017 103  101 - 111 mmol/L Final  . CO2 05/13/2017 26  22 - 32 mmol/L Final  . Glucose, Bld 05/13/2017 135* 65 - 99 mg/dL Final  . BUN 05/13/2017 55* 6 - 20 mg/dL Final  . Creatinine, Ser 05/13/2017 2.42* 0.61 - 1.24 mg/dL Final  . Calcium 05/13/2017 9.5  8.9 - 10.3 mg/dL Final  . Total Protein 05/13/2017 6.7  6.5 - 8.1 g/dL Final  . Albumin 05/13/2017 3.4* 3.5 - 5.0 g/dL Final  . AST 05/13/2017 21  15 - 41 U/L Final  . ALT 05/13/2017 19  17 - 63 U/L Final  . Alkaline Phosphatase 05/13/2017 68  38 - 126 U/L Final  . Total Bilirubin 05/13/2017 0.5  0.3 - 1.2 mg/dL Final  . GFR calc non Af Amer 05/13/2017 26* >60 mL/min Final  . GFR calc Af Amer 05/13/2017 30* >60 mL/min Final   Comment: (NOTE) The eGFR has been calculated using the CKD EPI equation. This calculation has not been validated in all clinical situations. eGFR's persistently <60 mL/min signify possible Chronic Kidney Disease.   Georgiann Hahn gap 05/13/2017 9  5 - 15 Final   Performed at [facility], [address], [place], [place] [address]     Pathology Orders Placed This Encounter  Procedures  . DG Bone Survey Met    Standing Status:   Future    Standing Expiration Date:   07/14/2018    Order Specific Question:   Reason for Exam (SYMPTOM  OR DIAGNOSIS REQUIRED)    Answer:   monoclonal gammopathy    Order Specific Question:   Preferred imaging location?    Answer:   [facility]    Order Specific Question:   Radiology Contrast Protocol - do NOT remove  file path    Answer:   [email or web address]  . CBC with Differential/Platelet    Standing Status:   Future    Standing Expiration Date:   05/14/2018  . Comprehensive metabolic panel    Standing Status:   Future    Standing Expiration Date:   05/14/2018  . Ferritin    Standing Status:   Future    Standing Expiration Date:   05/14/2018  . Lactate dehydrogenase    Standing Status:   Future    Standing Expiration Date:   05/14/2018  . Protein electrophoresis, serum    Standing Status:   Future    Standing Expiration Date:   05/14/2018  . Kappa/lambda light chains    Standing Status:   Future    Standing Expiration Date:   05/13/2018  . Beta 2 microglobuline, serum    Standing Status:   Future    Standing Expiration Date:   05/13/2018  . IgG, IgA, IgM    Standing Status:   Future  Standing Expiration Date:   05/13/2018       Zoila Shutter MD

## 2017-06-08 ENCOUNTER — Other Ambulatory Visit: Payer: Self-pay | Admitting: Cardiology

## 2017-06-25 DIAGNOSIS — E113591 Type 2 diabetes mellitus with proliferative diabetic retinopathy without macular edema, right eye: Secondary | ICD-10-CM | POA: Diagnosis not present

## 2017-06-25 DIAGNOSIS — E113412 Type 2 diabetes mellitus with severe nonproliferative diabetic retinopathy with macular edema, left eye: Secondary | ICD-10-CM | POA: Diagnosis not present

## 2017-06-25 DIAGNOSIS — D472 Monoclonal gammopathy: Secondary | ICD-10-CM | POA: Diagnosis not present

## 2017-06-25 DIAGNOSIS — N183 Chronic kidney disease, stage 3 (moderate): Secondary | ICD-10-CM | POA: Diagnosis not present

## 2017-06-25 DIAGNOSIS — H2513 Age-related nuclear cataract, bilateral: Secondary | ICD-10-CM | POA: Diagnosis not present

## 2017-06-25 DIAGNOSIS — D649 Anemia, unspecified: Secondary | ICD-10-CM | POA: Diagnosis not present

## 2017-06-25 DIAGNOSIS — H3562 Retinal hemorrhage, left eye: Secondary | ICD-10-CM | POA: Diagnosis not present

## 2017-06-25 DIAGNOSIS — E119 Type 2 diabetes mellitus without complications: Secondary | ICD-10-CM | POA: Diagnosis not present

## 2017-06-25 DIAGNOSIS — H4311 Vitreous hemorrhage, right eye: Secondary | ICD-10-CM | POA: Diagnosis not present

## 2017-06-25 DIAGNOSIS — Z794 Long term (current) use of insulin: Secondary | ICD-10-CM | POA: Diagnosis not present

## 2017-06-25 DIAGNOSIS — N2581 Secondary hyperparathyroidism of renal origin: Secondary | ICD-10-CM | POA: Diagnosis not present

## 2017-06-25 LAB — HM DIABETES EYE EXAM

## 2017-06-26 ENCOUNTER — Encounter: Payer: Self-pay | Admitting: *Deleted

## 2017-07-01 DIAGNOSIS — H3562 Retinal hemorrhage, left eye: Secondary | ICD-10-CM | POA: Diagnosis not present

## 2017-07-01 DIAGNOSIS — E113412 Type 2 diabetes mellitus with severe nonproliferative diabetic retinopathy with macular edema, left eye: Secondary | ICD-10-CM | POA: Diagnosis not present

## 2017-07-01 DIAGNOSIS — E113511 Type 2 diabetes mellitus with proliferative diabetic retinopathy with macular edema, right eye: Secondary | ICD-10-CM | POA: Diagnosis not present

## 2017-07-01 DIAGNOSIS — H4311 Vitreous hemorrhage, right eye: Secondary | ICD-10-CM | POA: Diagnosis not present

## 2017-07-03 ENCOUNTER — Other Ambulatory Visit: Payer: Self-pay | Admitting: Internal Medicine

## 2017-07-03 DIAGNOSIS — H4311 Vitreous hemorrhage, right eye: Secondary | ICD-10-CM | POA: Diagnosis not present

## 2017-07-03 DIAGNOSIS — E113511 Type 2 diabetes mellitus with proliferative diabetic retinopathy with macular edema, right eye: Secondary | ICD-10-CM | POA: Diagnosis not present

## 2017-07-04 MED ORDER — ALBUTEROL SULFATE (2.5 MG/3ML) 0.083% IN NEBU: INHALATION_SOLUTION | RESPIRATORY_TRACT | 11 refills | Status: DC

## 2017-07-04 NOTE — Telephone Encounter (Signed)
A fax was received from [facility] asking that Rx be resent with ICD-10 code for medicare part B. New rx was sent with Dx code of J45.21

## 2017-07-04 NOTE — Addendum Note (Signed)
Addended by: Denyse Amass on: 07/04/2017 09:32 AM   Modules accepted: Orders

## 2017-07-11 DIAGNOSIS — Z09 Encounter for follow-up examination after completed treatment for conditions other than malignant neoplasm: Secondary | ICD-10-CM | POA: Diagnosis not present

## 2017-07-11 DIAGNOSIS — E113412 Type 2 diabetes mellitus with severe nonproliferative diabetic retinopathy with macular edema, left eye: Secondary | ICD-10-CM | POA: Diagnosis not present

## 2017-07-11 DIAGNOSIS — E113511 Type 2 diabetes mellitus with proliferative diabetic retinopathy with macular edema, right eye: Secondary | ICD-10-CM | POA: Diagnosis not present

## 2017-07-18 ENCOUNTER — Encounter: Payer: Self-pay | Admitting: Internal Medicine

## 2017-07-25 DIAGNOSIS — H3562 Retinal hemorrhage, left eye: Secondary | ICD-10-CM | POA: Diagnosis not present

## 2017-07-25 DIAGNOSIS — E113512 Type 2 diabetes mellitus with proliferative diabetic retinopathy with macular edema, left eye: Secondary | ICD-10-CM | POA: Diagnosis not present

## 2017-08-01 DIAGNOSIS — E113512 Type 2 diabetes mellitus with proliferative diabetic retinopathy with macular edema, left eye: Secondary | ICD-10-CM | POA: Diagnosis not present

## 2017-08-01 DIAGNOSIS — H3562 Retinal hemorrhage, left eye: Secondary | ICD-10-CM | POA: Diagnosis not present

## 2017-08-06 DIAGNOSIS — E113512 Type 2 diabetes mellitus with proliferative diabetic retinopathy with macular edema, left eye: Secondary | ICD-10-CM | POA: Diagnosis not present

## 2017-08-06 DIAGNOSIS — E113511 Type 2 diabetes mellitus with proliferative diabetic retinopathy with macular edema, right eye: Secondary | ICD-10-CM | POA: Diagnosis not present

## 2017-08-13 ENCOUNTER — Ambulatory Visit: Payer: Medicare Other | Admitting: Internal Medicine

## 2017-08-13 DIAGNOSIS — E113511 Type 2 diabetes mellitus with proliferative diabetic retinopathy with macular edema, right eye: Secondary | ICD-10-CM | POA: Diagnosis not present

## 2017-08-13 DIAGNOSIS — E113512 Type 2 diabetes mellitus with proliferative diabetic retinopathy with macular edema, left eye: Secondary | ICD-10-CM | POA: Diagnosis not present

## 2017-08-23 ENCOUNTER — Other Ambulatory Visit: Payer: Self-pay | Admitting: Internal Medicine

## 2017-08-30 ENCOUNTER — Other Ambulatory Visit: Payer: Self-pay | Admitting: Internal Medicine

## 2017-08-30 DIAGNOSIS — J4521 Mild intermittent asthma with (acute) exacerbation: Secondary | ICD-10-CM

## 2017-09-13 ENCOUNTER — Other Ambulatory Visit (HOSPITAL_COMMUNITY): Payer: Medicare Other

## 2017-09-16 ENCOUNTER — Inpatient Hospital Stay (HOSPITAL_COMMUNITY): Payer: Medicare Other | Attending: Hematology

## 2017-09-16 DIAGNOSIS — N189 Chronic kidney disease, unspecified: Secondary | ICD-10-CM | POA: Insufficient documentation

## 2017-09-16 DIAGNOSIS — D472 Monoclonal gammopathy: Secondary | ICD-10-CM

## 2017-09-16 DIAGNOSIS — D509 Iron deficiency anemia, unspecified: Secondary | ICD-10-CM | POA: Diagnosis not present

## 2017-09-16 LAB — COMPREHENSIVE METABOLIC PANEL
ALBUMIN: 3.5 g/dL (ref 3.5–5.0)
ALK PHOS: 75 U/L (ref 38–126)
ALT: 25 U/L (ref 0–44)
ANION GAP: 6 (ref 5–15)
AST: 32 U/L (ref 15–41)
BILIRUBIN TOTAL: 0.5 mg/dL (ref 0.3–1.2)
BUN: 58 mg/dL — AB (ref 8–23)
CO2: 29 mmol/L (ref 22–32)
Calcium: 9.1 mg/dL (ref 8.9–10.3)
Chloride: 104 mmol/L (ref 98–111)
Creatinine, Ser: 3.64 mg/dL — ABNORMAL HIGH (ref 0.61–1.24)
GFR calc Af Amer: 18 mL/min — ABNORMAL LOW (ref >60)
GFR calc non Af Amer: 16 mL/min — ABNORMAL LOW (ref >60)
GLUCOSE: 130 mg/dL — AB (ref 70–99)
POTASSIUM: 4.2 mmol/L (ref 3.5–5.1)
Sodium: 139 mmol/L (ref 135–145)
TOTAL PROTEIN: 6.9 g/dL (ref 6.5–8.1)

## 2017-09-16 LAB — FERRITIN: FERRITIN: 88 ng/mL (ref 24–336)

## 2017-09-16 LAB — CBC WITH DIFFERENTIAL/PLATELET
Basophils Absolute: 0 10*3/uL (ref 0.0–0.1)
Basophils Relative: 0 %
Eosinophils Absolute: 0.1 10*3/uL (ref 0.0–0.7)
Eosinophils Relative: 2 %
HEMATOCRIT: 31.5 % — AB (ref 39.0–52.0)
Hemoglobin: 10.6 g/dL — ABNORMAL LOW (ref 13.0–17.0)
LYMPHS PCT: 26 %
Lymphs Abs: 1 10*3/uL (ref 0.7–4.0)
MCH: 31 pg (ref 26.0–34.0)
MCHC: 33.7 g/dL (ref 30.0–36.0)
MCV: 92.1 fL (ref 78.0–100.0)
MONO ABS: 0.4 10*3/uL (ref 0.1–1.0)
MONOS PCT: 10 %
NEUTROS ABS: 2.5 10*3/uL (ref 1.7–7.7)
Neutrophils Relative %: 62 %
Platelets: 257 10*3/uL (ref 150–400)
RBC: 3.42 MIL/uL — ABNORMAL LOW (ref 4.22–5.81)
RDW: 12 % (ref 11.5–15.5)
WBC: 4 10*3/uL (ref 4.0–10.5)

## 2017-09-16 LAB — LACTATE DEHYDROGENASE: LDH: 258 U/L — ABNORMAL HIGH (ref 98–192)

## 2017-09-17 DIAGNOSIS — E113511 Type 2 diabetes mellitus with proliferative diabetic retinopathy with macular edema, right eye: Secondary | ICD-10-CM | POA: Diagnosis not present

## 2017-09-17 DIAGNOSIS — E113512 Type 2 diabetes mellitus with proliferative diabetic retinopathy with macular edema, left eye: Secondary | ICD-10-CM | POA: Diagnosis not present

## 2017-09-17 DIAGNOSIS — H3562 Retinal hemorrhage, left eye: Secondary | ICD-10-CM | POA: Diagnosis not present

## 2017-09-17 DIAGNOSIS — H4311 Vitreous hemorrhage, right eye: Secondary | ICD-10-CM | POA: Diagnosis not present

## 2017-09-17 LAB — PROTEIN ELECTROPHORESIS, SERUM
A/G Ratio: 1 (ref 0.7–1.7)
ALPHA-2-GLOBULIN: 1 g/dL (ref 0.4–1.0)
Albumin ELP: 3 g/dL (ref 2.9–4.4)
Alpha-1-Globulin: 0.3 g/dL (ref 0.0–0.4)
Beta Globulin: 1 g/dL (ref 0.7–1.3)
GLOBULIN, TOTAL: 3.1 g/dL (ref 2.2–3.9)
Gamma Globulin: 0.7 g/dL (ref 0.4–1.8)
M-Spike, %: 0.5 g/dL — ABNORMAL HIGH
TOTAL PROTEIN ELP: 6.1 g/dL (ref 6.0–8.5)

## 2017-09-17 LAB — IGG, IGA, IGM
IGA: 154 mg/dL (ref 61–437)
IgG (Immunoglobin G), Serum: 767 mg/dL (ref 700–1600)
IgM (Immunoglobulin M), Srm: 35 mg/dL (ref 20–172)

## 2017-09-17 LAB — KAPPA/LAMBDA LIGHT CHAINS
KAPPA FREE LGHT CHN: 117 mg/L — AB (ref 3.3–19.4)
KAPPA, LAMDA LIGHT CHAIN RATIO: 1.97 — AB (ref 0.26–1.65)
LAMDA FREE LIGHT CHAINS: 59.3 mg/L — AB (ref 5.7–26.3)

## 2017-09-17 LAB — BETA 2 MICROGLOBULIN, SERUM: Beta-2 Microglobulin: 5 mg/L — ABNORMAL HIGH (ref 0.6–2.4)

## 2017-09-20 ENCOUNTER — Inpatient Hospital Stay (HOSPITAL_BASED_OUTPATIENT_CLINIC_OR_DEPARTMENT_OTHER): Payer: Medicare Other | Admitting: Hematology

## 2017-09-20 ENCOUNTER — Encounter (HOSPITAL_COMMUNITY): Payer: Self-pay | Admitting: Hematology

## 2017-09-20 ENCOUNTER — Ambulatory Visit (HOSPITAL_COMMUNITY)
Admission: RE | Admit: 2017-09-20 | Discharge: 2017-09-20 | Disposition: A | Discharge status: Home / Self Care | Payer: Medicare Other | Source: Ambulatory Visit | Attending: Internal Medicine | Admitting: Internal Medicine

## 2017-09-20 VITALS — BP 173/84 | HR 82 | Temp 98.3°F | Resp 16 | Wt 238.0 lb

## 2017-09-20 DIAGNOSIS — D472 Monoclonal gammopathy: Secondary | ICD-10-CM

## 2017-09-20 DIAGNOSIS — N189 Chronic kidney disease, unspecified: Secondary | ICD-10-CM

## 2017-09-20 DIAGNOSIS — X58XXXA Exposure to other specified factors, initial encounter: Secondary | ICD-10-CM | POA: Insufficient documentation

## 2017-09-20 DIAGNOSIS — S22000A Wedge compression fracture of unspecified thoracic vertebra, initial encounter for closed fracture: Secondary | ICD-10-CM | POA: Diagnosis not present

## 2017-09-20 DIAGNOSIS — D509 Iron deficiency anemia, unspecified: Secondary | ICD-10-CM | POA: Diagnosis not present

## 2017-09-20 NOTE — Progress Notes (Signed)
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 67 y.o. male returns for routine follow-up iron deficiency anemia. He is starting to feel more fatigued during the day. He feels improvement from the iron infusions for about 2-3 months. He is has bilateral lower extremity edema. He has a follow appointment with her cardiologist and Nephrologist in a few weeks. Patient reports his appetite remains good at 100%. His energy level now is 50%.      REVIEW OF SYSTEMS:  Review of Systems  Constitutional: Positive for fatigue.  HENT:  Negative.   Eyes: Negative.   Respiratory: Positive for wheezing.   Cardiovascular: Positive for leg swelling.  Endocrine: Negative.   Genitourinary: Negative.    Musculoskeletal: Negative.   Skin: Negative.   Neurological: Negative.   Hematological: Negative.   Psychiatric/Behavioral: Negative.      PAST MEDICAL/SURGICAL HISTORY:  Past Medical History:  Diagnosis Date  . Anemia   . 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 Asthma   . Chronic combined systolic (congestive) and diastolic (congestive) heart failure ([place])    a. 12/31/14: 2D ECHO: EF 40-45%, HK of inf myocardium, G1DD, mod MR  . CKD (chronic kidney disease), stage III (HCC)    stage 3 kidney disease  . Coronary artery disease    a. LHC 01/2015 - triple vessel CAD (mod oLM, mLAD, severe mRCA, intermediate branch stenosis, CTO of mCx). Plan CABG 02/2015.  Marland Kitchen GERD (gastroesophageal reflux disease)   . Hyperlipidemia   . Hypertension   . Mitral regurgitation    a. Mild-mod by echo 12/2014.  Marland Kitchen Myocardial infarction [facility])    pt. states per [name] he has in the past  . NSVT (nonsustained ventricular  tachycardia) ([place])    a. 9 beats during 01/2015 adm. BB titrated.  . Obesity    a. BMI 33  . Type II diabetes mellitus ([place])    Past Surgical History:  Procedure Laterality Date  . BACK SURGERY    . CARDIAC CATHETERIZATION N/A 02/09/2015   Procedure: Left Heart Cath and Coronary Angiography;  Surgeon: Burnell Blanks, MD;  Location: [facility];  Service: Cardiovascular;  Laterality: N/A;  . COLONOSCOPY  2011   Dr. [name]: Ileocecal valve appeared normal, scattered pancolonic diverticulosis, difficult bowel prep making smaller lesions potentially missed. Recommended three-year follow-up colonoscopy.  . COLONOSCOPY  2003   Dr. [name]: Suspicious lesion at the ileocecal valve, multiple biopsies benign, pancolonic diverticulosis.  . COLONOSCOPY N/A 02/15/2016   diverticulosis in sigmoid and descending colon, single 5 mm polyp at splenic flexure (Tubular adenoma)  . CORONARY ARTERY BYPASS GRAFT N/A 02/18/2015   Procedure: CORONARY ARTERY BYPASS GRAFTING (CABG) x  four, using bilateral internal mammary arteries and right leg greater saphenous vein harvested endoscopically;  Surgeon: [name], MD;  Location: [facility] OR;  Service: Open Heart Surgery;  Laterality: N/A;  . ESOPHAGOGASTRODUODENOSCOPY N/A 02/15/2016   normal  . GIVENS CAPSULE STUDY N/A 01/08/2017   couple of gastric and small bowel eroions in setting of aspirin 81 mg daily but nothing concerning, continue Hematology follow-up  . HERNIA REPAIR  2130   Umbilical  . LUMBAR [place] SURGERY  2006  L4 & L5  . TEE WITHOUT CARDIOVERSION N/A 02/18/2015   Procedure: TRANSESOPHAGEAL ECHOCARDIOGRAM (TEE);  Surgeon: [name], MD;  Location: [facility];  Service: Open Heart Surgery;  Laterality: N/A;  . TONSILLECTOMY  1962     SOCIAL HISTORY:  Social History   Socioeconomic History  . Marital status: Married    Spouse name: Not on file  . Number of children: Not on file  . Years of education: Not on file  . Highest education  level: Not on file  Occupational History  . Not on file  Social Needs  . Financial resource strain: Not hard at all  . Food insecurity:    Worry: Never true    Inability: Never true  . Transportation needs:    Medical: No    Non-medical: No  Tobacco Use  . Smoking status: Former Smoker    Years: 17.00    Types: Cigars    Last attempt to quit: 12/31/2014    Years since quitting: 2.7  . Smokeless tobacco: Never Used  Substance and Sexual Activity  . Alcohol use: Yes    Alcohol/week: 0.0 standard drinks    Comment: occasional glass of wine.  . Drug use: No  . Sexual activity: Yes    Birth control/protection: None  Lifestyle  . Physical activity:    Days per week: 5 days    Minutes per session: 30 min  . Stress: Only a little  Relationships  . Social connections:    Talks on phone: More than three times a week    Gets together: More than three times a week    Attends religious service: More than 4 times per year    Active member of club or organization: Yes    Attends meetings of clubs or organizations: 1 to 4 times per year    Relationship status: Married  . Intimate partner violence:    Fear of current or ex partner: No    Emotionally abused: No    Physically abused: No    Forced sexual activity: No  Other Topics Concern  . Not on file  Social History Narrative  . Not on file    FAMILY HISTORY:  Family History  Problem Relation Age of Onset  . Diabetes Mother   . Heart disease Mother        later in life, age >81  . Heart disease Father        heart failure later in life  . Hypertension Father   . Stroke Sister   . Kidney disease Daughter   . Sudden death Daughter   . Colon cancer Paternal Uncle        Passed from colon CA  . Heart attack Neg Hx     CURRENT MEDICATIONS:  Outpatient Encounter Medications as of 09/20/2017  Medication Sig  . acetaminophen (TYLENOL) 500 MG tablet Take 1,000 mg by mouth 2 (two) times daily as needed for moderate pain.  Marland Kitchen  albuterol (PROVENTIL) (2.5 MG/3ML) 0.083% nebulizer solution USE ONE VIAL IN NEBULIZER EVERY 6 HOURS AS NEEDED FOR WHEEZING FOR SHORTNESS OF BREATH  . amLODipine (NORVASC) 10 MG tablet TAKE 1 TABLET BY MOUTH ONCE DAILY  . aspirin EC 81 MG tablet Take 1 tablet (81 mg total) by mouth daily.  Marland Kitchen atorvastatin (LIPITOR) 40 MG tablet TAKE 1 TABLET BY MOUTH EVERY EVENING  . carvedilol (COREG) 12.5 MG tablet TAKE 1 TABLET BY MOUTH TWICE DAILY WITH A MEAL  . Cholecalciferol ([employer]S VITAMIN  D3) 1000 UNITS capsule Take 2 capsules (2,000 Units total) by mouth daily.  Marland Kitchen CINNAMON PO Take 1,000 mg by mouth 2 (two) times daily.   . Coenzyme Q10 (CO Q 10 PO) Take 200 mg by mouth daily.   . fluticasone (FLONASE) 50 MCG/ACT nasal spray Place 2 sprays into both nostrils daily as needed for allergies or rhinitis (SEASONAL ALLERGIES).  . Fluticasone-Salmeterol (ADVAIR DISKUS) 100-50 MCG/DOSE AEPB Inhale 1 puff into the lungs 2 (two) times daily.  . furosemide (LASIX) 40 MG tablet Take 1.5 tablets (60 mg total) by mouth 2 (two) times daily.  Marland Kitchen glucose blood (ONE TOUCH ULTRA TEST) test strip Use one strip to check glucose three times daily. Dx: E11.29  . hydrALAZINE (APRESOLINE) 25 MG tablet TAKE ONE TABLET BY MOUTH TWICE DAILY  . insulin aspart (NOVOLOG FLEXPEN) 100 UNIT/ML FlexPen INJECT 5 UNITS SUBCUTANEOUSLYIF BLOOD SUGAR IS ABOVE 250, 10 UNITS IF ABOVE 300.  Marland Kitchen insulin detemir (LEVEMIR) 100 UNIT/ML injection Inject 0.56 mLs (56 Units total) into the skin daily. E11.65  . montelukast (SINGULAIR) 10 MG tablet TAKE 1 TABLET BY MOUTH ONCE DAILY AT BEDTIME  . Multiple Vitamins-Minerals (ONE-A-DAY MENS 50+ ADVANTAGE) TABS Take 1 tablet by mouth daily.  Marland Kitchen PROAIR HFA 108 (90 Base) MCG/ACT inhaler INHALE 2 PUFFS INTO LUNGS EVERY 6 HOURS AS NEEDED FOR SHORTNESS OF BREATH  . sacubitril-valsartan (ENTRESTO) 24-26 MG Take 1 tablet by mouth 2 (two) times daily.  Marland Kitchen terbinafine (LAMISIL) 1 % cream Apply 1 application topically daily as  needed (foot).   . nitroGLYCERIN (NITROSTAT) 0.4 MG SL tablet Place 1 tablet (0.4 mg total) under the tongue every 5 (five) minutes as needed for chest pain.   Facility-Administered Encounter Medications as of 09/20/2017  Medication  . regadenoson (LEXISCAN) injection SOLN 0.4 mg    ALLERGIES:  No Known Allergies   PHYSICAL EXAM:  ECOG Performance status: 1  Vital Signs:BP: 173/84, P: 82, R: 18, TEMP: 98.3, SATS:100%  Physical Exam  Constitutional: He is oriented to person, place, and time. He appears well-developed and well-nourished.  Cardiovascular: Normal rate, regular rhythm and normal heart sounds.  Pulmonary/Chest: He has wheezes.  Musculoskeletal: He exhibits edema (bilateral lower ext).  Neurological: He is alert and oriented to person, place, and time.  Skin: Skin is warm and dry.     LABORATORY DATA:  I have reviewed the labs as listed.  CBC    Component Value Date/Time   WBC 4.0 09/16/2017 1512   RBC 3.42 (L) 09/16/2017 1512   HGB 10.6 (L) 09/16/2017 1512   HGB 9.8 (L) 12/25/2016 1527   HCT 31.5 (L) 09/16/2017 1512   HCT 30.1 (L) 12/25/2016 1527   PLT 257 09/16/2017 1512   PLT 283 12/25/2016 1527   MCV 92.1 09/16/2017 1512   MCV 96 12/25/2016 1527   MCH 31.0 09/16/2017 1512   MCHC 33.7 09/16/2017 1512   RDW 12.0 09/16/2017 1512   RDW 13.8 12/25/2016 1527   LYMPHSABS 1.0 09/16/2017 1512   LYMPHSABS 1.1 11/15/2016 0815   MONOABS 0.4 09/16/2017 1512   EOSABS 0.1 09/16/2017 1512   EOSABS 0.2 11/15/2016 0815   BASOSABS 0.0 09/16/2017 1512   BASOSABS 0.0 11/15/2016 0815   CMP Latest Ref Rng & Units 09/16/2017 05/13/2017 05/08/2017  Glucose 70 - 99 mg/dL 130(H) 135(H) 146(H)  BUN 8 - 23 mg/dL 58(H) 55(H) 58(H)  Creatinine 0.61 - 1.24 mg/dL 3.64(H) 2.42(H) 2.61(H)  Sodium 135 - 145 mmol/L 139 138 138  Potassium 3.5 -  5.1 mmol/L 4.2 4.9 4.2  Chloride 98 - 111 mmol/L 104 103 100  CO2 22 - 32 mmol/L 29 26 30   Calcium 8.9 - 10.3 mg/dL 9.1 9.5 9.0  Total Protein  6.5 - 8.1 g/dL 6.9 6.7 -  Total Bilirubin 0.3 - 1.2 mg/dL 0.5 0.5 -  Alkaline Phos 38 - 126 U/L 75 68 -  AST 15 - 41 U/L 32 21 -  ALT 0 - 44 U/L 25 19 -         ASSESSMENT & PLAN:   Iron deficiency anemia 1.  Normocytic anemia: -This is from combination of CKD and relative iron deficiency. Last Injectafer was on 03/14/2017.  Denies any bleeding per rectum or melena. -We discussed the results of his blood work which showed hemoglobin of 10.6 and hematocrit of 31.5.  Ferritin has decreased to 88. -Hence I have recommended Injectafer weekly x2. -We will reassess him in 4 months.  2.  MGUS:  -M spike went up slightly to 0.5 g/dL.  Free light chain ratio is stable around 1.97. -His creatinine went up to 3.8.  This has gone up from 2.4 at prior visit.  However he reports that his Lasix was increased to twice daily by his kidney doctor Dr. Posey Pronto. -Patient had skeletal survey done today.  Reports of the study are not available yet.  We will follow-up on those results. -He will also require immunofixation to identify the type of monoclonal protein.  Beta-2 microglobulin was elevated at 5.0.  LDH is also mildly elevated.      Orders placed this encounter:  Orders Placed This Encounter  Procedures  . Protein electrophoresis, serum  . Lactate dehydrogenase, isoenzymes  . Immunofixation electrophoresis  . Kappa/lambda light chains  . CBC with Differential/Platelet  . Comprehensive metabolic panel  . Ferritin  . Iron and TIBC  . Vitamin B12      Derek Jack, [place] [phone]

## 2017-09-20 NOTE — Assessment & Plan Note (Signed)
1.  Normocytic anemia: -This is from combination of CKD and relative iron deficiency. Last Injectafer was on 03/14/2017.  Denies any bleeding per rectum or melena. -We discussed the results of his blood work which showed hemoglobin of 10.6 and hematocrit of 31.5.  Ferritin has decreased to 88. -Hence I have recommended Injectafer weekly x2. -We will reassess him in 4 months.  2.  MGUS:  -M spike went up slightly to 0.5 g/dL.  Free light chain ratio is stable around 1.97. -His creatinine went up to 3.8.  This has gone up from 2.4 at prior visit.  However he reports that his Lasix was increased to twice daily by his kidney doctor Dr. Posey Pronto. -Patient had skeletal survey done today.  Reports of the study are not available yet.  We will follow-up on those results. -He will also require immunofixation to identify the type of monoclonal protein.  Beta-2 microglobulin was elevated at 5.0.  LDH is also mildly elevated.

## 2017-09-20 NOTE — Patient Instructions (Signed)
[place] at [facility] Discharge Instructions  You saw Dr. Raliegh Ip today.   Thank you for choosing [place] at [facility] to provide your oncology and hematology care.  To afford each patient quality time with our provider, please arrive at least 15 minutes before your scheduled appointment time.   If you have a lab appointment with the [facility] please come in thru the  Main Entrance and check in at the main information desk  You need to re-schedule your appointment should you arrive 10 or more minutes late.  We strive to give you quality time with our providers, and arriving late affects you and other patients whose appointments are after yours.  Also, if you no show three or more times for appointments you may be dismissed from the clinic at the providers discretion.     Again, thank you for choosing [facility].  Our hope is that these requests will decrease the amount of time that you wait before being seen by our physicians.       _____________________________________________________________  Should you have questions after your visit to [facility], please contact our office at [phone] between the hours of 8:00 a.m. and 4:30 p.m.  Voicemails left after 4:00 p.m. will not be returned until the following business day.  For prescription refill requests, have your pharmacy contact our office and allow 72 hours.    Cancer Center Support Programs:   > Cancer Support Group  2nd Tuesday of the month 1pm-2pm, Journey Room

## 2017-09-26 ENCOUNTER — Inpatient Hospital Stay (HOSPITAL_COMMUNITY): Payer: Medicare Other

## 2017-09-26 DIAGNOSIS — D509 Iron deficiency anemia, unspecified: Secondary | ICD-10-CM | POA: Diagnosis not present

## 2017-09-26 DIAGNOSIS — N189 Chronic kidney disease, unspecified: Secondary | ICD-10-CM | POA: Diagnosis not present

## 2017-09-26 DIAGNOSIS — D472 Monoclonal gammopathy: Secondary | ICD-10-CM | POA: Diagnosis not present

## 2017-09-26 MED ORDER — SODIUM CHLORIDE 0.9% FLUSH
3.0000 mL | Freq: Once | INTRAVENOUS | Status: AC
  Administered 2017-09-26: 3 mL via INTRAVENOUS

## 2017-09-26 MED ORDER — SODIUM CHLORIDE 0.9 % IV SOLN
INTRAVENOUS | Status: DC
  Administered 2017-09-26: 14:00:00 via INTRAVENOUS

## 2017-09-26 MED ORDER — SODIUM CHLORIDE 0.9 % IV SOLN
750.0000 mg | Freq: Once | INTRAVENOUS | Status: AC
  Administered 2017-09-26: 750 mg via INTRAVENOUS
  Filled 2017-09-26: qty 15

## 2017-09-26 NOTE — Progress Notes (Signed)
Bethann Punches Shiffer tolerated Injectafer infusion without incident or complaint. VSS upon completion of treatment. Discharged self ambulatory in satisfactory condition.

## 2017-09-26 NOTE — Patient Instructions (Signed)
[place] at [facility]  Discharge Instructions:  Today you received an Injectafer infusion. Follow up as scheduled. Call clinic for any questions or concerns.  _______________________________________________________________  Thank you for choosing [place] at [facility] to provide your oncology and hematology care.  To afford each patient quality time with our providers, please arrive at least 15 minutes before your scheduled appointment.  You need to re-schedule your appointment if you arrive 10 or more minutes late.  We strive to give you quality time with our providers, and arriving late affects you and other patients whose appointments are after yours.  Also, if you no show three or more times for appointments you may be dismissed from the clinic.  Again, thank you for choosing [place] at [place] hope is that these requests will allow you access to exceptional care and in a timely manner. _______________________________________________________________  If you have questions after your visit, please contact our office at [phone] between the hours of 8:30 a.m. and 5:00 p.m. Voicemails left after 4:30 p.m. will not be returned until the following business day. _______________________________________________________________  For prescription refill requests, have your pharmacy contact our office. _______________________________________________________________  Recommendations made by the consultant and any test results will be sent to your referring physician. _______________________________________________________________

## 2017-09-27 ENCOUNTER — Other Ambulatory Visit: Payer: Self-pay | Admitting: Internal Medicine

## 2017-09-30 MED ORDER — FLUTICASONE-SALMETEROL 100-50 MCG/DOSE IN AEPB: 1.0000 | INHALATION_SPRAY | Freq: Two times a day (BID) | RESPIRATORY_TRACT | 3 refills | Status: DC

## 2017-09-30 NOTE — Telephone Encounter (Signed)
Ok to fill? This hasn't been filled since 2017

## 2017-09-30 NOTE — Addendum Note (Signed)
Addended by: Denyse Amass on: 09/30/2017 10:26 AM   Modules accepted: Orders

## 2017-10-02 ENCOUNTER — Encounter: Payer: Self-pay | Admitting: Internal Medicine

## 2017-10-03 ENCOUNTER — Other Ambulatory Visit (HOSPITAL_COMMUNITY): Payer: Self-pay | Admitting: Nurse Practitioner

## 2017-10-03 ENCOUNTER — Inpatient Hospital Stay (HOSPITAL_COMMUNITY): Payer: Medicare Other

## 2017-10-03 VITALS — BP 151/71 | HR 66 | Temp 98.0°F | Resp 16

## 2017-10-03 DIAGNOSIS — D472 Monoclonal gammopathy: Secondary | ICD-10-CM | POA: Diagnosis not present

## 2017-10-03 DIAGNOSIS — D509 Iron deficiency anemia, unspecified: Secondary | ICD-10-CM

## 2017-10-03 DIAGNOSIS — N189 Chronic kidney disease, unspecified: Secondary | ICD-10-CM | POA: Diagnosis not present

## 2017-10-03 MED ORDER — SODIUM CHLORIDE 0.9 % IV SOLN
Freq: Once | INTRAVENOUS | Status: AC
  Administered 2017-10-03: 14:00:00 via INTRAVENOUS

## 2017-10-03 MED ORDER — SODIUM CHLORIDE 0.9 % IV SOLN
750.0000 mg | Freq: Once | INTRAVENOUS | Status: AC
  Administered 2017-10-03: 750 mg via INTRAVENOUS
  Filled 2017-10-03: qty 15

## 2017-10-03 NOTE — Patient Instructions (Signed)
[facility] at [facility]  Discharge Instructions:  You received an iron infusion today.  _______________________________________________________________  Thank you for choosing [facility] at [facility] to provide your oncology and hematology care.  To afford each patient quality time with our providers, please arrive at least 15 minutes before your scheduled appointment.  You need to re-schedule your appointment if you arrive 10 or more minutes late.  We strive to give you quality time with our providers, and arriving late affects you and other patients whose appointments are after yours.  Also, if you no show three or more times for appointments you may be dismissed from the clinic.  Again, thank you for choosing [facility] at [facility]. Our hope is that these requests will allow you access to exceptional care and in a timely manner. _______________________________________________________________  If you have questions after your visit, please contact our office at [phone] between the hours of 8:30 a.m. and 5:00 p.m. Voicemails left after 4:30 p.m. will not be returned until the following business day. _______________________________________________________________  For prescription refill requests, have your pharmacy contact our office. _______________________________________________________________  Recommendations made by the consultant and any test results will be sent to your referring physician. _______________________________________________________________ 

## 2017-10-03 NOTE — Progress Notes (Signed)
Patient tolerated iron infusion with no complaints voiced. Good blood return noted before and after administration of iron.  No complaints of pain at site.  Band aid applied.  VSS with discharge and left ambulatory with no s/s of distress noted.

## 2017-10-08 DIAGNOSIS — H3562 Retinal hemorrhage, left eye: Secondary | ICD-10-CM | POA: Diagnosis not present

## 2017-10-08 DIAGNOSIS — E113512 Type 2 diabetes mellitus with proliferative diabetic retinopathy with macular edema, left eye: Secondary | ICD-10-CM | POA: Diagnosis not present

## 2017-10-09 ENCOUNTER — Ambulatory Visit (INDEPENDENT_AMBULATORY_CARE_PROVIDER_SITE_OTHER): Payer: Medicare Other | Admitting: Nurse Practitioner

## 2017-10-09 ENCOUNTER — Other Ambulatory Visit: Payer: Self-pay | Admitting: *Deleted

## 2017-10-09 ENCOUNTER — Encounter: Payer: Self-pay | Admitting: Nurse Practitioner

## 2017-10-09 VITALS — BP 160/80 | HR 79 | Ht 73.0 in | Wt 239.8 lb

## 2017-10-09 DIAGNOSIS — Z79899 Other long term (current) drug therapy: Secondary | ICD-10-CM | POA: Diagnosis not present

## 2017-10-09 DIAGNOSIS — Z951 Presence of aortocoronary bypass graft: Secondary | ICD-10-CM | POA: Diagnosis not present

## 2017-10-09 DIAGNOSIS — I10 Essential (primary) hypertension: Secondary | ICD-10-CM | POA: Diagnosis not present

## 2017-10-09 DIAGNOSIS — R0683 Snoring: Secondary | ICD-10-CM

## 2017-10-09 DIAGNOSIS — I255 Ischemic cardiomyopathy: Secondary | ICD-10-CM | POA: Diagnosis not present

## 2017-10-09 DIAGNOSIS — I5042 Chronic combined systolic (congestive) and diastolic (congestive) heart failure: Secondary | ICD-10-CM

## 2017-10-09 DIAGNOSIS — I110 Hypertensive heart disease with heart failure: Secondary | ICD-10-CM

## 2017-10-09 LAB — BASIC METABOLIC PANEL
BUN/Creatinine Ratio: 13 (ref 10–24)
BUN: 37 mg/dL — ABNORMAL HIGH (ref 8–27)
CO2: 23 mmol/L (ref 20–29)
Calcium: 8.4 mg/dL — ABNORMAL LOW (ref 8.6–10.2)
Chloride: 106 mmol/L (ref 96–106)
Creatinine, Ser: 2.96 mg/dL — ABNORMAL HIGH (ref 0.76–1.27)
GFR calc Af Amer: 24 mL/min/{1.73_m2} — ABNORMAL LOW (ref >59)
GFR calc non Af Amer: 21 mL/min/{1.73_m2} — ABNORMAL LOW (ref >59)
Glucose: 164 mg/dL — ABNORMAL HIGH (ref 65–99)
Potassium: 4.3 mmol/L (ref 3.5–5.2)
Sodium: 143 mmol/L (ref 134–144)

## 2017-10-09 MED ORDER — HYDRALAZINE HCL 50 MG PO TABS: 50.0000 mg | ORAL_TABLET | Freq: Two times a day (BID) | ORAL | 3 refills | Status: DC

## 2017-10-09 NOTE — Patient Instructions (Addendum)
.  We will be checking the following labs today - BMET   Medication Instructions:    Continue with your current medicines. BUT  I am increasing the Hydralazine to 50 mg twice a day - you can take 2 of your 25 mg tablets to use up. The RX for the 50 mg is at the pharmacy.      Testing/Procedures To Be Arranged:  Sleep study ordered  Follow-Up:   See [name] in about 6 weeks    Other Special Instructions:   N/A    If you need a refill on your cardiac medications before your next appointment, please call your pharmacy.   Call the [place] office at [phone] if you have any questions, problems or concerns.

## 2017-10-09 NOTE — Progress Notes (Signed)
CARDIOLOGY OFFICE NOTE  Date:  10/09/2017    Henry Carroll Date of Birth: [birth year] Medical Record #[id number]  PCP:  Gildardo Cranker, DO  Cardiologist:  [name]   Chief Complaint  Patient presents with  . Coronary Artery Disease  . Congestive Heart Failure  . Hypertension  . Hyperlipidemia    Follow up visit - seen     History of Present Illness: Henry Carroll is a 67 y.o. male who presents today for a follow up visit. Seen for [name].   He has known CAD s/p1/6/17 CABG 4 with LIMA to LAD, free right internal mammary graft to ramus, SVG to OM, SVG to PDA. Has diabetes, hypertension, hyperlipidemia, stage III CKD (Dr. 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 Mild to moderate MR. Prior nuclear stress test was intermediate risk showing a medium-sized severe basal to mid inferior inferolateral perfusion defect. EF on nuclear stress was 30%. Echo from February of 2018 showed improvement with EF of 45 to 50%.   He had issues with anemia in November of 2018 - this required transfusion. He was sent back to GI - then to hematology - placed on iron infusions.   In September of 2018 - seen as a work in with volume overload - had EKG changes. Remained anemic as well. Progressive CKD. We diuresed him, got a Myoview and updated his echo. Myoview with EF of 40% - prior MI. Echo with EF 40 to 45% - improved slightly from prior study.  Last seen by me back in November of 2018 - at that time he was doing well - had been placed on iron infusions. Last saw [name] in March of this year - was continuing to do well. He is now on Entresto.   Comes in today. Here alone. He has had issues with his eyes. Says Dr. Zadie Rhine told him he needed a sleep test. He has had hemorrhage in his right eye and some issues with his left eye. Wife had noted snoring. He notes it is hard to have a good night sleep due to nocturia.  He was told this was from sleep apnea. He has otherwise done well. He has been to some family reunions - he has been eating "too well". Not short of breath. No swelling. No chest pain. BP is creeping up. Still seeing hematology and nephrology.   Past Medical History:  Diagnosis Date  . Anemia   . 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 Asthma   . Chronic combined systolic (congestive) and diastolic (congestive) heart failure ([place])    a. 12/31/14: 2D ECHO: EF 40-45%, HK of inf myocardium, G1DD, mod MR  . CKD (chronic kidney disease), stage III (HCC)    stage 3 kidney disease  . Coronary artery disease    a. LHC 01/2015 - triple vessel CAD (mod oLM, mLAD, severe mRCA, intermediate branch stenosis, CTO of mCx). Plan CABG 02/2015.  Marland Kitchen GERD (gastroesophageal reflux disease)   . Hyperlipidemia   . Hypertension   . Mitral regurgitation    a. Mild-mod by echo 12/2014.  Marland Kitchen Myocardial infarction [facility])    pt. states per [name] he has in the past  . NSVT (nonsustained ventricular tachycardia) ([place])    a. 9 beats during 01/2015 adm. BB titrated.  . Obesity    a. BMI 33  . Type II diabetes mellitus [facility])     Past Surgical  History:  Procedure Laterality Date  . BACK SURGERY    . CARDIAC CATHETERIZATION N/A 02/09/2015   Procedure: Left Heart Cath and Coronary Angiography;  Surgeon: Burnell Blanks, MD;  Location: [facility] LAB;  Service: Cardiovascular;  Laterality: N/A;  . COLONOSCOPY  2011   Dr. [name]: Ileocecal valve appeared normal, scattered pancolonic diverticulosis, difficult bowel prep making smaller lesions potentially missed. Recommended three-year follow-up colonoscopy.  . COLONOSCOPY  2003   Dr. [name]: Suspicious lesion at the ileocecal valve, multiple biopsies benign, pancolonic diverticulosis.  . COLONOSCOPY N/A 02/15/2016   diverticulosis in sigmoid and descending colon, single 5 mm polyp at splenic flexure (Tubular adenoma)  . CORONARY ARTERY BYPASS GRAFT N/A 02/18/2015    Procedure: CORONARY ARTERY BYPASS GRAFTING (CABG) x  four, using bilateral internal mammary arteries and right leg greater saphenous vein harvested endoscopically;  Surgeon: [name], MD;  Location: [facility] OR;  Service: Open Heart Surgery;  Laterality: N/A;  . ESOPHAGOGASTRODUODENOSCOPY N/A 02/15/2016   normal  . GIVENS CAPSULE STUDY N/A 01/08/2017   couple of gastric and small bowel eroions in setting of aspirin 81 mg daily but nothing concerning, continue Hematology follow-up  . HERNIA REPAIR  [id number]   Umbilical  . LUMBAR [place] SURGERY  2006   L4 & L5  . TEE WITHOUT CARDIOVERSION N/A 02/18/2015   Procedure: TRANSESOPHAGEAL ECHOCARDIOGRAM (TEE);  Surgeon: [name], MD;  Location: [facility];  Service: Open Heart Surgery;  Laterality: N/A;  . TONSILLECTOMY  1962     Medications: Current Meds  Medication Sig  . acetaminophen (TYLENOL) 500 MG tablet Take 1,000 mg by mouth 2 (two) times daily as needed for moderate pain.  Marland Kitchen albuterol (PROVENTIL) (2.5 MG/3ML) 0.083% nebulizer solution USE ONE VIAL IN NEBULIZER EVERY 6 HOURS AS NEEDED FOR WHEEZING FOR SHORTNESS OF BREATH  . amLODipine (NORVASC) 10 MG tablet TAKE 1 TABLET BY MOUTH ONCE DAILY  . aspirin EC 81 MG tablet Take 1 tablet (81 mg total) by mouth daily.  Marland Kitchen atorvastatin (LIPITOR) 40 MG tablet TAKE 1 TABLET BY MOUTH EVERY EVENING  . carvedilol (COREG) 12.5 MG tablet TAKE 1 TABLET BY MOUTH TWICE DAILY WITH A MEAL  . Cholecalciferol ([employer]S VITAMIN D3) 1000 UNITS capsule Take 2 capsules (2,000 Units total) by mouth daily.  Marland Kitchen CINNAMON PO Take 1,000 mg by mouth 2 (two) times daily.   . Coenzyme Q10 (CO Q 10 PO) Take 200 mg by mouth daily.   . fluticasone (FLONASE) 50 MCG/ACT nasal spray Place 2 sprays into both nostrils daily as needed for allergies or rhinitis (SEASONAL ALLERGIES).  . Fluticasone-Salmeterol (ADVAIR DISKUS) 100-50 MCG/DOSE AEPB Inhale 1 puff into the lungs 2 (two) times daily.  . Fluticasone-Salmeterol (ADVAIR) 100-50 MCG/DOSE AEPB  INHALE ONE DOSE BY MOUTH TWICE DAILY  . furosemide (LASIX) 40 MG tablet Take 1.5 tablets (60 mg total) by mouth 2 (two) times daily.  Marland Kitchen glucose blood (ONE TOUCH ULTRA TEST) test strip Use one strip to check glucose three times daily. Dx: E11.29  . insulin aspart (NOVOLOG FLEXPEN) 100 UNIT/ML FlexPen INJECT 5 UNITS SUBCUTANEOUSLYIF BLOOD SUGAR IS ABOVE 250, 10 UNITS IF ABOVE 300.  Marland Kitchen insulin detemir (LEVEMIR) 100 UNIT/ML injection Inject 0.56 mLs (56 Units total) into the skin daily. E11.65  . montelukast (SINGULAIR) 10 MG tablet TAKE 1 TABLET BY MOUTH ONCE DAILY AT BEDTIME  . Multiple Vitamins-Minerals (ONE-A-DAY MENS 50+ ADVANTAGE) TABS Take 1 tablet by mouth daily.  Marland Kitchen PROAIR HFA 108 (90 Base) MCG/ACT  inhaler INHALE 2 PUFFS INTO LUNGS EVERY 6 HOURS AS NEEDED FOR SHORTNESS OF BREATH  . sacubitril-valsartan (ENTRESTO) 24-26 MG Take 1 tablet by mouth 2 (two) times daily.  Marland Kitchen terbinafine (LAMISIL) 1 % cream Apply 1 application topically daily as needed (foot).   . [DISCONTINUED] hydrALAZINE (APRESOLINE) 25 MG tablet TAKE ONE TABLET BY MOUTH TWICE DAILY     Allergies: No Known Allergies  Social History: The patient  reports that he quit smoking about 2 years ago. His smoking use included cigars. He quit after 17.00 years of use. He has never used smokeless tobacco. He reports that he drinks alcohol. He reports that he does not use drugs.   Family History: The patient's family history includes Colon cancer in his paternal uncle; Diabetes in his mother; Heart disease in his father and mother; Hypertension in his father; Kidney disease in his daughter; Stroke in his sister; Sudden death in his daughter.   Review of Systems: Please see the history of present illness.   Otherwise, the review of systems is positive for none.   All other systems are reviewed and negative.   Physical Exam: VS:  BP (!) 160/80 (BP Location: Left Arm, Patient Position: Sitting, Cuff Size: Normal)   Pulse 79   Ht 6\' 1"   (1.854 m)   Wt 239 lb 12.8 oz (108.8 kg)   SpO2 98% Comment: at rest  BMI 31.64 kg/m  .  BMI Body mass index is 31.64 kg/m.  Wt Readings from Last 3 Encounters:  10/09/17 239 lb 12.8 oz (108.8 kg)  09/20/17 238 lb (108 kg)  05/13/17 227 lb (103 kg)    General: Pleasant. Well developed, well nourished and in no acute distress.  He has gained weight.  HEENT: Normal.  Neck: Supple, no JVD, carotid bruits, or masses noted.  Cardiac: Regular rate and rhythm. No murmurs, rubs, or gallops. No edema.  Respiratory:  Lungs are clear to auscultation bilaterally with normal work of breathing.  GI: Soft and nontender.  MS: No deformity or atrophy. Gait and ROM intact.  Skin: Warm and dry. Color is normal.  Neuro:  Strength and sensation are intact and no gross focal deficits noted.  Psych: Alert, appropriate and with normal affect.   LABORATORY DATA:  EKG:  EKG is not ordered today.  Lab Results  Component Value Date   WBC 4.0 09/16/2017   HGB 10.6 (L) 09/16/2017   HCT 31.5 (L) 09/16/2017   PLT 257 09/16/2017   GLUCOSE 130 (H) 09/16/2017   CHOL 144 05/08/2017   TRIG 137 05/08/2017   HDL 35 (L) 05/08/2017   LDLCALC 85 05/08/2017   ALT 25 09/16/2017   AST 32 09/16/2017   NA 139 09/16/2017   K 4.2 09/16/2017   CL 104 09/16/2017   CREATININE 3.64 (H) 09/16/2017   BUN 58 (H) 09/16/2017   CO2 29 09/16/2017   TSH 0.94 12/19/2015   PSA 0.8 12/19/2015   INR 1.44 02/18/2015   HGBA1C 7.6 (H) 05/08/2017   MICROALBUR 271.8 01/10/2017     BNP (last 3 results) No results for input(s): BNP in the last 8760 hours.  ProBNP (last 3 results) Recent Labs    11/07/16 1530  PROBNP 1,448*     Other Studies Reviewed Today:  Echo Study Conclusions 11/2016  - Left ventricle: The cavity size was mildly dilated. Wall   thickness was normal. Systolic function was mildly to moderately   reduced. The estimated ejection fraction was in the range of  40%   to 45%. There is hypokinesis of  the inferolateral and inferior   myocardium. Doppler parameters are consistent with abnormal left   ventricular relaxation (grade 1 diastolic dysfunction). - Ascending aorta: The ascending aorta was mildly dilated. - Mitral valve: Calcified annulus. There was mild regurgitation. - Left atrium: The atrium was mildly dilated. - Right atrium: The atrium was mildly dilated. - Pulmonary arteries: Systolic pressure was mildly increased. PA   peak pressure: 44 mm Hg (S).  Impressions:  - Hypokinesis of the inferior and inferolateral walls with overall   mild to moderate LV dysfunction; mild diastolic dysfunction;   mildly dilated ascending aorta; mild MR; mild biatrial   enlargement; mild TR with mildly elevated pulmonary pressure.   Myoview Study Highlights 10/2016    The left ventricular ejection fraction is moderately decreased (30-44%).  Nuclear stress EF: 40%.  There was no ST segment deviation noted during stress.  Findings consistent with prior myocardial infarction with peri-infarct ischemia.  This is an intermediate risk study.   1. EF 40% with inferolateral hypokinesis.  2. Primarily fixed medium-sized, moderate intensity basal to mid inferolateral and basal inferior perfusion defect.  This is suggestive of prior infarction with mild peri-infarct ischemia.   Intermediate risk study primarily due to low EF.       Assessment/Plan:  1. CAD with prior CABG in 2017 - stable Myoview from 2018 - he has been managed medically. He has no active symptoms.   2. ICM with chronic systolic HF - EF 11% by last echo - he is on beta blocker, hydralazine and Entresto along with diuretic. He admits he has been eating more - he is not having more symptoms.   3. HTN - BP is up - has been running higher lately. I am very hesitant to increasing the Entresto given worsening CKD. Recheck BMET today. Hydralazine to 50 mg BID.   4. HLD - on statin  5. DM - per PCP  6. CKD - seeing  nephrology next month. Looks to me to have worsening CKD - now on Entresto - recheck today. Will discuss with [name]. May need to discontinue. Last check we have from 4 months ago showed a creatinine of 2.42.   7. Chronic Anemia - he had normal endoscopy. Now on iron infusions.   8. Eye disorder/hemorrhage - seeing Dr. Zadie Rhine  9. Snoring/sleep disorder - he has been advised to have a sleep study - this has been ordered.     Current medicines are reviewed with the patient today.  The patient does not have concerns regarding medicines other than what has been noted above.  The following changes have been made:  See above.  Labs/ tests ordered today include:    Orders Placed This Encounter  Procedures  . Basic metabolic panel     Disposition:   FU with me or [name] in 6 weeks.   Patient is agreeable to this plan and will call if any problems develop in the interim.   SignedTruitt Merle, NP  10/09/2017 11:15 AM  [facility] [address] [place], [place]  [address] Phone: [phone] Fax: [phone]

## 2017-10-10 ENCOUNTER — Telehealth: Payer: Self-pay | Admitting: *Deleted

## 2017-10-10 NOTE — Telephone Encounter (Signed)
Patient is scheduled for lab study on 10/20/17. Patient understands his sleep study will be done at [facility] lab. Patient understands he will receive a sleep packet in a week or so. Patient understands to call if he does not receive the sleep packet in a timely manner. Patient understands he should check in at the emergency department.   Left detailed message on voicemail with date and time of titration and informed patient to call back to confirm or reschedule.

## 2017-10-10 NOTE — Telephone Encounter (Signed)
Per Truitt Merle split night study ordered and sent to precert.

## 2017-10-15 DIAGNOSIS — E113511 Type 2 diabetes mellitus with proliferative diabetic retinopathy with macular edema, right eye: Secondary | ICD-10-CM | POA: Diagnosis not present

## 2017-10-15 DIAGNOSIS — N183 Chronic kidney disease, stage 3 (moderate): Secondary | ICD-10-CM | POA: Diagnosis not present

## 2017-10-20 ENCOUNTER — Ambulatory Visit: Payer: Medicare Other | Attending: Nurse Practitioner | Admitting: Cardiology

## 2017-10-20 DIAGNOSIS — I4891 Unspecified atrial fibrillation: Secondary | ICD-10-CM | POA: Diagnosis not present

## 2017-10-20 DIAGNOSIS — R0683 Snoring: Secondary | ICD-10-CM | POA: Diagnosis not present

## 2017-10-20 DIAGNOSIS — G4733 Obstructive sleep apnea (adult) (pediatric): Secondary | ICD-10-CM | POA: Diagnosis not present

## 2017-10-20 DIAGNOSIS — I493 Ventricular premature depolarization: Secondary | ICD-10-CM | POA: Diagnosis not present

## 2017-10-20 IMAGING — NM NM MYOCAR MULTI W/ SPECT
3 series · 18 of 18 positions shown · non-contrast
Comparison: none

[Series 1: rest · 6.51mm/px · 6 of 64 frames shown]
[frame 6/64]
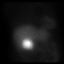
[frame 16/64]
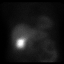
[frame 27/64]
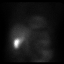
[frame 38/64]
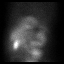
[frame 48/64]
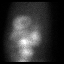
[frame 59/64]
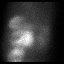

[Series 2: stress · 6.51mm/px · 6 of 64 frames shown (1 of 2)]
[frame 6/64]
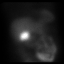
[frame 16/64]
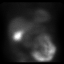
[frame 27/64]
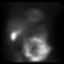
[frame 38/64]
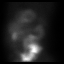
[frame 48/64]
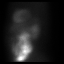
[frame 59/64]
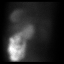

[Series 2: stress · 6.51mm/px · 6 of 512 frames shown (2 of 2)]
[frame 43/512]
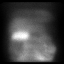
[frame 128/512]
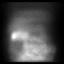
[frame 214/512]
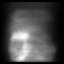
[frame 299/512]
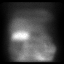
[frame 384/512]
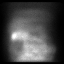
[frame 470/512]
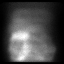

[18 of 18 positions shown; findings below may reference images not displayed]

Canned report from images found in remote index.

Refer to host system for actual result text.

## 2017-10-21 NOTE — Procedures (Signed)
   Patient Name: Henry Carroll, Henry Carroll  Study Date:01/29/2017 10/20/2017   Gender: Male  D.O.B: 05/21/1950  Age (years): 67  Referring Provider: Truitt Merle  Height (inches): 73  Interpreting Physician: Fransico Him MD, ABSM  Weight (lbs): 239  RPSGT: Rosebud Poles  BMI: 32  MRN: [id number]  Neck Size: 19.00   CLINICAL INFORMATION  Sleep Study Type: NPSG Indication for sleep study: Snoring Epworth Sleepiness Score: 9  SLEEP STUDY TECHNIQUE  As per the AASM Manual for the Scoring of Sleep and Associated Events v2.3 (April 2016) with a hypopnea requiring 4% desaturations. The channels recorded and monitored were frontal, central and occipital EEG, electrooculogram (EOG), submentalis EMG (chin), nasal and oral airflow, thoracic and abdominal wall motion, anterior tibialis EMG, snore microphone, electrocardiogram, and pulse oximetry.  MEDICATIONS  Medications self-administered by patient taken the night of the study : N/A  SLEEP ARCHITECTURE  The study was initiated at 10:29:20 PM and ended at 4:53:56 AM. Sleep onset time was 16.7 minutes and the sleep efficiency was 70.1%%. The total sleep time was 269.5 minutes. Stage REM latency was 199.5 minutes. The patient spent 8.7%% of the night in stage N1 sleep, 80.9%% in stage N2 sleep, 0.0%% in stage N3 and 10.4% in REM. Alpha intrusion was absent. Supine sleep was 1.48%.  RESPIRATORY PARAMETERS  The overall apnea/hypopnea index (AHI) was 7.3 per hour. There were 23 total apneas, including 20 obstructive, 0 central and 3 mixed apneas. There were 10 hypopneas and 29 RERAs. The AHI during Stage REM sleep was 34.3 per hour. AHI while supine was 0.0 per hour. The mean oxygen saturation was 97.0%. The minimum SpO2 during sleep was 92.0%. moderate snoring was noted during this study.  CARDIAC DATA  The 2 lead EKG demonstrated sinus rhythm. The mean heart rate was 76.2 beats per minute. Other EKG findings include: Atrial Fibrillation,  PVCs.   LEG MOVEMENT DATA  The total PLMS were 0 with a resulting PLMS index of 0.0. Associated arousal with leg movement index was 5.3 .  IMPRESSIONS  Mild obstructive sleep apnea occurred during this study (AHI = 7.3/h). No significant central sleep apnea occurred during this study (CAI = 0.0/h).  The patient had minimal or no oxygen desaturation during the study (Min O2 = 92.0%)  The patient snored with moderate snoring volume.  EKG findings include PVCs and ventricular couplets Clinically significant periodic limb movements did not occur during sleep. Associated arousals were significant.  DIAGNOSIS  Obstructive Sleep Apnea (327.23 [G47.33 ICD-10])  RECOMMENDATIONS  Given increase Epworth sleepiness score, mild OSA overall and severe OSA during REM sleep, recommend CPAP titration in lab.  Avoid alcohol, sedatives and other CNS depressants that may worsen sleep apnea and disrupt normal sleep architecture.  Sleep hygiene should be reviewed to assess factors that may improve sleep quality.  Weight management and regular exercise should be initiated or continued if appropriate.   [Electronically signed] 10/21/2017 10:55 AM Fransico Him MD, ABSM  Diplomate, American Board of Sleep Medicine

## 2017-10-22 DIAGNOSIS — E113511 Type 2 diabetes mellitus with proliferative diabetic retinopathy with macular edema, right eye: Secondary | ICD-10-CM | POA: Diagnosis not present

## 2017-10-22 DIAGNOSIS — E113512 Type 2 diabetes mellitus with proliferative diabetic retinopathy with macular edema, left eye: Secondary | ICD-10-CM | POA: Diagnosis not present

## 2017-10-25 ENCOUNTER — Other Ambulatory Visit: Payer: Self-pay | Admitting: Cardiology

## 2017-10-25 ENCOUNTER — Telehealth: Payer: Self-pay | Admitting: *Deleted

## 2017-10-25 DIAGNOSIS — G4733 Obstructive sleep apnea (adult) (pediatric): Secondary | ICD-10-CM

## 2017-10-25 NOTE — Telephone Encounter (Signed)
-----   Message from Sueanne Margarita, MD sent at 10/21/2017 10:58 AM EDT ----- Please let patient know that they have sleep apnea and recommend CPAP titration. Please set up titration in the sleep lab.

## 2017-10-25 NOTE — Telephone Encounter (Signed)
Informed patient of sleep study results and patient understanding was verbalized. Patient understands his sleep study showed  they have sleep apnea and recommend CPAP titration. Pt is aware and agreeable to these results. 

## 2017-10-28 ENCOUNTER — Telehealth: Payer: Self-pay | Admitting: *Deleted

## 2017-10-28 NOTE — Telephone Encounter (Signed)
Staff message sent to Gae Bon ok to schedule CPAP titration study. Per Tommi Rumps B @ [employer] no PA is required since the patient's medicare is the primary insurance.

## 2017-10-28 NOTE — Telephone Encounter (Signed)
-----   Message from Lauralee Evener, [place] sent at 10/25/2017  2:16 PM EDT ----- CPAP titration

## 2017-10-29 DIAGNOSIS — Z09 Encounter for follow-up examination after completed treatment for conditions other than malignant neoplasm: Secondary | ICD-10-CM | POA: Diagnosis not present

## 2017-10-29 DIAGNOSIS — E113512 Type 2 diabetes mellitus with proliferative diabetic retinopathy with macular edema, left eye: Secondary | ICD-10-CM | POA: Diagnosis not present

## 2017-10-29 DIAGNOSIS — E113511 Type 2 diabetes mellitus with proliferative diabetic retinopathy with macular edema, right eye: Secondary | ICD-10-CM | POA: Diagnosis not present

## 2017-10-30 ENCOUNTER — Encounter: Payer: Self-pay | Admitting: *Deleted

## 2017-10-30 ENCOUNTER — Telehealth: Payer: Self-pay | Admitting: *Deleted

## 2017-10-30 NOTE — Telephone Encounter (Addendum)
Patient is scheduled for CPAP Titration on 11/11/17. Patient understands his titration study will be done at [facility] lab. Patient understands he will receive a letter in a week or so detailing appointment, date, time, and location. Patient understands to call if he does not receive the letter  in a timely manner. Patient agrees with treatment and thanked me for call.

## 2017-10-30 NOTE — Telephone Encounter (Signed)
-----   Message from Lauralee Evener, [place] sent at 10/28/2017 10:34 AM EDT ----- Per Georgina Snell B @[employer]  patient's Medicare is primary insurance, therefore no PA is required for CPAP titration study. Ok to schedule. ----- Message ----- From: Lauralee Evener, CMA Sent: 10/25/2017   2:16 PM EDT To: [facility] Sleep Studies  CPAP titration

## 2017-10-31 DIAGNOSIS — N183 Chronic kidney disease, stage 3 (moderate): Secondary | ICD-10-CM | POA: Diagnosis not present

## 2017-10-31 DIAGNOSIS — D649 Anemia, unspecified: Secondary | ICD-10-CM | POA: Diagnosis not present

## 2017-10-31 DIAGNOSIS — I129 Hypertensive chronic kidney disease with stage 1 through stage 4 chronic kidney disease, or unspecified chronic kidney disease: Secondary | ICD-10-CM | POA: Diagnosis not present

## 2017-10-31 DIAGNOSIS — N2581 Secondary hyperparathyroidism of renal origin: Secondary | ICD-10-CM | POA: Diagnosis not present

## 2017-11-11 ENCOUNTER — Ambulatory Visit: Payer: Medicare Other | Attending: Cardiology | Admitting: Cardiology

## 2017-11-11 DIAGNOSIS — I491 Atrial premature depolarization: Secondary | ICD-10-CM | POA: Diagnosis not present

## 2017-11-11 DIAGNOSIS — I493 Ventricular premature depolarization: Secondary | ICD-10-CM | POA: Diagnosis not present

## 2017-11-11 DIAGNOSIS — G4733 Obstructive sleep apnea (adult) (pediatric): Secondary | ICD-10-CM | POA: Diagnosis not present

## 2017-11-13 NOTE — Procedures (Signed)
  Patient Name: Henry Carroll, Henry Carroll Date: 11/11/2017   Gender: Male  D.O.B: 1950-12-23  Age (years): 67  Referring Provider: Truitt Merle  Height (inches): 73  Interpreting Physician: Fransico Him MD, ABSM  Weight (lbs): 239  RPSGT: Rosebud Poles  BMI: 32  MRN: [id number]  Neck Size: 19.00   CLINICAL INFORMATION  The patient is referred for a CPAP titration to treat sleep apnea.  SLEEP STUDY TECHNIQUE  As per the AASM Manual for the Scoring of Sleep and Associated Events v2.3 (April 2016) with a hypopnea requiring 4% desaturations. The channels recorded and monitored were frontal, central and occipital EEG, electrooculogram (EOG), submentalis EMG (chin), nasal and oral airflow, thoracic and abdominal wall motion, anterior tibialis EMG, snore microphone, electrocardiogram, and pulse oximetry. Continuous positive airway pressure (CPAP) was initiated at the beginning of the study and titrated to treat sleep-disordered breathing.  MEDICATIONS  Medications self-administered by patient taken the night of the study : N/A  TECHNICIAN COMMENTS  Comments added by technician: Patient had more than two awakenings to use the bathroom. CPAP therapy started at 4 cm of H20 and increased to 6 cm of H20, due to snoring. Patient tolerated CPAP very well. Questionable Bruxism noticed during therapy. Mild snoring noticed through out therapy Comments added by scorer: N/A   RESPIRATORY PARAMETERS  Optimal PAP Pressure (cm): 6 AHI at Optimal Pressure (/hr): 0.0  Overall Minimal O2 (%): 92.0 Supine % at Optimal Pressure (%): 100  Minimal O2 at Optimal Pressure (%): 92.0       SLEEP ARCHITECTURE  The study was initiated at 10:17:55 PM and ended at 5:19:18 AM. Sleep onset time was 15.8 minutes and the sleep efficiency was 73.0%%. The total sleep time was 307.6 minutes. The patient spent 6.2%% of the night in stage N1 sleep, 77.6%% in stage N2 sleep, 10.2%% in stage N3 and 6% in REM.Stage REM  latency was 183.0 minutes Wake after sleep onset was 98.0. Alpha intrusion was absent. Supine sleep was 23.57%.  CARDIAC DATA  The 2 lead EKG demonstrated sinus rhythm. The mean heart rate was 70.6 beats per minute. Other EKG findings include: PVCs and PACs.  LEG MOVEMENT DATA  The total Periodic Limb Movements of Sleep (PLMS) were 0. The PLMS index was 0.0. A PLMS index of <15 is considered normal in adults.  IMPRESSIONS  The optimal PAP pressure was 6 cm of water. Central sleep apnea was not noted during this titration (CAI = 0.0/h).  Significant oxygen desaturations were not observed during this titration (min O2 = 92.0%).  The patient snored with soft snoring volume during this titration study.  2-lead EKG demonstrated: PVCs and PACs Clinically significant periodic limb movements were not noted during this study. Arousals associated with PLMs were rare.  DIAGNOSIS  Obstructive Sleep Apnea (327.23 [G47.33 ICD-10])  RECOMMENDATIONS  Trial of CPAP therapy on 6 cm H2O with a Large size Resmed Full Face Mask AirFit F20 mask and heated humidification. Avoid alcohol, sedatives and other CNS depressants that may worsen sleep apnea and disrupt normal sleep architecture.  Sleep hygiene should be reviewed to assess factors that may improve sleep quality.  Weight management and regular exercise should be initiated or continued.  Return to [facility] for re-evaluation after 10 weeks of therapy  [Electronically signed] 11/13/2017 11:04 AM Fransico Him MD, ABSM  Diplomate, American Board of Sleep Medicine

## 2017-11-15 ENCOUNTER — Telehealth: Payer: Self-pay | Admitting: *Deleted

## 2017-11-15 NOTE — Telephone Encounter (Signed)
-----   Message from Sueanne Margarita, MD sent at 11/13/2017 11:08 AM EDT ----- Please let patient know that they had a successful PAP titration and let DME know that orders are in EPIC.  Please set up 10 week OV with me.

## 2017-11-15 NOTE — Telephone Encounter (Signed)
Informed patient of titration results and verbalized understanding was indicated. Patient understands his titration study showed they had a successful PAP titration and orders are in EPIC.   Upon patient request DME selection is [facility]. Patient understands he will be contacted by [facility] to set up his cpap. Patient understands to call if [facility] does not contact him with new setup in a timely manner. Patient understands they will be called once confirmation has been received from [facility] that they have received their new machine to schedule 10 week follow up appointment.  [facility] notified of new cpap order  Please add to airview Patient was grateful for the call and thanked me.

## 2017-11-19 ENCOUNTER — Ambulatory Visit (INDEPENDENT_AMBULATORY_CARE_PROVIDER_SITE_OTHER): Payer: Medicare Other | Admitting: Cardiology

## 2017-11-19 ENCOUNTER — Encounter: Payer: Self-pay | Admitting: Cardiology

## 2017-11-19 VITALS — BP 148/78 | HR 63 | Ht 73.0 in | Wt 235.6 lb

## 2017-11-19 DIAGNOSIS — I5042 Chronic combined systolic (congestive) and diastolic (congestive) heart failure: Secondary | ICD-10-CM

## 2017-11-19 DIAGNOSIS — I255 Ischemic cardiomyopathy: Secondary | ICD-10-CM

## 2017-11-19 DIAGNOSIS — I10 Essential (primary) hypertension: Secondary | ICD-10-CM

## 2017-11-19 DIAGNOSIS — G4733 Obstructive sleep apnea (adult) (pediatric): Secondary | ICD-10-CM

## 2017-11-19 DIAGNOSIS — E119 Type 2 diabetes mellitus without complications: Secondary | ICD-10-CM | POA: Diagnosis not present

## 2017-11-19 DIAGNOSIS — Z951 Presence of aortocoronary bypass graft: Secondary | ICD-10-CM | POA: Diagnosis not present

## 2017-11-19 MED ORDER — CARVEDILOL 25 MG PO TABS: 25.0000 mg | ORAL_TABLET | Freq: Two times a day (BID) | ORAL | 3 refills | Status: DC

## 2017-11-19 NOTE — Patient Instructions (Signed)
Medication Instructions:  Please increase your Carvedilol to 25 mg twice a day. Continue all other medications as listed.  If you need a refill on your cardiac medications before your next appointment, please call your pharmacy.   Follow-Up: At [facility], you and your health needs are our priority.  As part of our continuing mission to provide you with exceptional heart care, we have created designated Provider Care Teams.  These Care Teams include your primary Cardiologist (physician) and Advanced Practice Providers (APPs -  Physician Assistants and Nurse Practitioners) who all work together to provide you with the care you need, when you need it. . You will need a follow up appointment in 4 months with Truitt Merle, NP.   Please follow up with Dr Marlou Porch in 8 months. Please call our office 2 months in advance to schedule this appointment.    Thank you for choosing [facility]!!

## 2017-11-19 NOTE — Progress Notes (Signed)
Cardiology Office Note:    Date:  11/19/2017   ID:  [name], DOB 04-Oct-1950, MRN [id number]  PCP:  Gildardo Cranker, DO  Cardiologist:  Candee Furbish, MD  Electrophysiologist:  None   Referring MD: Gildardo Cranker, DO     History of Present Illness:    SARATH PRIVOTT is a 67 y.o. male with CAD post CABG 02/18/2015 with chronic systolic heart failure improved from 30 to 45% here for follow-up.   His right eye has improved after surgery, injections.  He had a vitreous hemorrhage.  He is still enjoying playing golf.  NYHA class I-II currently.  No syncope, no bleeding, no orthopnea, no PND.  Blood pressure still remains an issue.  He has been seeing Dr. Posey Pronto with nephrology.  He recommended increasing his Lasix.    Past Medical History:  Diagnosis Date  . Anemia   . 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 Asthma   . Chronic combined systolic (congestive) and diastolic (congestive) heart failure ([place])    a. 12/31/14: 2D ECHO: EF 40-45%, HK of inf myocardium, G1DD, mod MR  . CKD (chronic kidney disease), stage III (HCC)    stage 3 kidney disease  . Coronary artery disease    a. LHC 01/2015 - triple vessel CAD (mod oLM, mLAD, severe mRCA, intermediate branch stenosis, CTO of mCx). Plan CABG 02/2015.  Marland Kitchen GERD (gastroesophageal reflux disease)   . Hyperlipidemia   . Hypertension   . Mitral regurgitation    a. Mild-mod by echo 12/2014.  Marland Kitchen Myocardial infarction [facility])    pt. states per [name] he has in the past  . NSVT (nonsustained ventricular tachycardia) ([place])    a. 9 beats during 01/2015 adm. BB titrated.  . Obesity    a. BMI 33  . Type II diabetes mellitus ([place])     Past Surgical History:  Procedure Laterality Date  . BACK SURGERY    . CARDIAC CATHETERIZATION N/A 02/09/2015   Procedure: Left Heart Cath and Coronary Angiography;  Surgeon: Burnell Blanks, MD;  Location: [place] [facility];  Service: Cardiovascular;  Laterality: N/A;  . COLONOSCOPY  2011   Dr. [name]: Ileocecal valve appeared normal, scattered pancolonic diverticulosis, difficult bowel prep making smaller lesions potentially missed. Recommended three-year follow-up colonoscopy.  . COLONOSCOPY  2003   Dr. [name]: Suspicious lesion at the ileocecal valve, multiple biopsies benign, pancolonic diverticulosis.  . COLONOSCOPY N/A 02/15/2016   diverticulosis in sigmoid and descending colon, single 5 mm polyp at splenic flexure (Tubular adenoma)  . CORONARY ARTERY BYPASS GRAFT N/A 02/18/2015   Procedure: CORONARY ARTERY BYPASS GRAFTING (CABG) x  four, using bilateral internal mammary arteries and right leg greater saphenous vein harvested endoscopically;  Surgeon: [name], MD;  Location: [place] OR;  Service: Open Heart Surgery;  Laterality: N/A;  . ESOPHAGOGASTRODUODENOSCOPY N/A 02/15/2016   normal  . GIVENS CAPSULE STUDY N/A 01/08/2017   couple of gastric and small bowel eroions in setting of aspirin 81 mg daily but nothing concerning, continue Hematology follow-up  . HERNIA REPAIR  4034   Umbilical  . LUMBAR [place] SURGERY  2006   L4 & L5  . TEE WITHOUT CARDIOVERSION N/A 02/18/2015   Procedure: TRANSESOPHAGEAL ECHOCARDIOGRAM (TEE);  Surgeon: [name], MD;  Location: [place];  Service: Open Heart Surgery;  Laterality: N/A;  . TONSILLECTOMY  1962    Current Medications: Current Meds  Medication Sig  . acetaminophen (TYLENOL) 500 MG tablet Take  1,000 mg by mouth 2 (two) times daily as needed for moderate pain.  Marland Kitchen albuterol (PROVENTIL) (2.5 MG/3ML) 0.083% nebulizer solution USE ONE VIAL IN NEBULIZER EVERY 6 HOURS AS NEEDED FOR WHEEZING FOR SHORTNESS OF BREATH  . amLODipine (NORVASC) 10 MG tablet TAKE 1 TABLET BY MOUTH ONCE DAILY  . aspirin EC 81 MG tablet Take 1 tablet (81 mg total) by mouth daily.  Marland Kitchen atorvastatin (LIPITOR) 40 MG tablet TAKE 1 TABLET BY MOUTH EVERY EVENING  . carvedilol (COREG) 25 MG tablet Take 1 tablet (25 mg total) by mouth 2 (two) times daily with a meal.  .  Cholecalciferol ([employer] VITAMIN D3) 1000 UNITS capsule Take 2 capsules (2,000 Units total) by mouth daily.  Marland Kitchen CINNAMON PO Take 1,000 mg by mouth 2 (two) times daily.   . Coenzyme Q10 (CO Q 10 PO) Take 200 mg by mouth daily.   . fluticasone (FLONASE) 50 MCG/ACT nasal spray Place 2 sprays into both nostrils daily as needed for allergies or rhinitis (SEASONAL ALLERGIES).  . Fluticasone-Salmeterol (ADVAIR DISKUS) 100-50 MCG/DOSE AEPB Inhale 1 puff into the lungs 2 (two) times daily.  . furosemide (LASIX) 40 MG tablet Take 80 mg by mouth 2 (two) times daily.  Marland Kitchen glucose blood (ONE TOUCH ULTRA TEST) test strip Use one strip to check glucose three times daily. Dx: E11.29  . hydrALAZINE (APRESOLINE) 50 MG tablet Take 1 tablet (50 mg total) by mouth 2 (two) times daily.  . insulin detemir (LEVEMIR) 100 UNIT/ML injection Inject 0.56 mLs (56 Units total) into the skin daily. E11.65  . montelukast (SINGULAIR) 10 MG tablet TAKE 1 TABLET BY MOUTH ONCE DAILY AT BEDTIME  . Multiple Vitamins-Minerals (ONE-A-DAY MENS 50+ ADVANTAGE) TABS Take 1 tablet by mouth daily.  . nitroGLYCERIN (NITROSTAT) 0.4 MG SL tablet Place 1 tablet (0.4 mg total) under the tongue every 5 (five) minutes as needed for chest pain.  Marland Kitchen PROAIR HFA 108 (90 Base) MCG/ACT inhaler INHALE 2 PUFFS INTO LUNGS EVERY 6 HOURS AS NEEDED FOR SHORTNESS OF BREATH  . sacubitril-valsartan (ENTRESTO) 24-26 MG Take 1 tablet by mouth 2 (two) times daily.  Marland Kitchen terbinafine (LAMISIL) 1 % cream Apply 1 application topically daily as needed (foot).   . [DISCONTINUED] carvedilol (COREG) 12.5 MG tablet TAKE 1 TABLET BY MOUTH TWICE DAILY WITH A MEAL     Allergies:   Patient has no known allergies.   Social History   Socioeconomic History  . Marital status: Married    Spouse name: Not on file  . Number of children: Not on file  . Years of education: Not on file  . Highest education level: Not on file  Occupational History  . Not on file  Social Needs  . Financial  resource strain: Not hard at all  . Food insecurity:    Worry: Never true    Inability: Never true  . Transportation needs:    Medical: No    Non-medical: No  Tobacco Use  . Smoking status: Former Smoker    Years: 17.00    Types: Cigars    Last attempt to quit: 12/31/2014    Years since quitting: 2.8  . Smokeless tobacco: Never Used  Substance and Sexual Activity  . Alcohol use: Yes    Alcohol/week: 0.0 standard drinks    Comment: occasional glass of wine.  . Drug use: No  . Sexual activity: Yes    Birth control/protection: None  Lifestyle  . Physical activity:    Days per week: 5 days  Minutes per session: 30 min  . Stress: Only a little  Relationships  . Social connections:    Talks on phone: More than three times a week    Gets together: More than three times a week    Attends religious service: More than 4 times per year    Active member of club or organization: Yes    Attends meetings of clubs or organizations: 1 to 4 times per year    Relationship status: Married  Other Topics Concern  . Not on file  Social History Narrative  . Not on file     Family History: The patient's family history includes Colon cancer in his paternal uncle; Diabetes in his mother; Heart disease in his father and mother; Hypertension in his father; Kidney disease in his daughter; Stroke in his sister; Sudden death in his daughter. There is no history of Heart attack.  ROS:   Please see the history of present illness.     All other systems reviewed and are negative.  EKGs/Labs/Other Studies Reviewed:    The following studies were reviewed today:  Echocardiogram 11/15/2016: - Left ventricle: The cavity size was mildly dilated. Wall   thickness was normal. Systolic function was mildly to moderately   reduced. The estimated ejection fraction was in the range of 40%   to 45%. There is hypokinesis of the inferolateral and inferior   myocardium. Doppler parameters are consistent with  abnormal left   ventricular relaxation (grade 1 diastolic dysfunction). - Ascending aorta: The ascending aorta was mildly dilated. - Mitral valve: Calcified annulus. There was mild regurgitation. - Left atrium: The atrium was mildly dilated. - Right atrium: The atrium was mildly dilated. - Pulmonary arteries: Systolic pressure was mildly increased. PA   peak pressure: 44 mm Hg (S).  Impressions:  - Hypokinesis of the inferior and inferolateral walls with overall   mild to moderate LV dysfunction; mild diastolic dysfunction;   mildly dilated ascending aorta; mild MR; mild biatrial   enlargement; mild TR with mildly elevated pulmonary pressure.  Myoview Study Highlights 10/2016    The left ventricular ejection fraction is moderately decreased (30-44%).  Nuclear stress EF: 40%.  There was no ST segment deviation noted during stress.  Findings consistent with prior myocardial infarction with peri-infarct ischemia.  This is an intermediate risk study.  1. EF 40% with inferolateral hypokinesis.  2. Primarily fixed medium-sized, moderate intensity basal to mid inferolateral and basal inferior perfusion defect. This is suggestive of prior infarction with mild peri-infarct ischemia.   Intermediate risk study primarily due to low EF.      EKG:  EKG is  ordered today.  The ekg ordered today demonstrates 11/19/2017-sinus rhythm 63 with nonspecific ST-T wave changes, cannot exclude old inferior infarct pattern.  Personally reviewed and interpreted.  Recent Labs: 09/16/2017: ALT 25; Hemoglobin 10.6; Platelets 257 10/09/2017: BUN 37; Creatinine, Ser 2.96; Potassium 4.3; Sodium 143  Recent Lipid Panel    Component Value Date/Time   CHOL 144 05/08/2017 0841   CHOL 149 06/01/2015 1500   TRIG 137 05/08/2017 0841   HDL 35 (L) 05/08/2017 0841   HDL 45 06/01/2015 1500   CHOLHDL 4.1 05/08/2017 0841   VLDL 33 (H) 09/26/2016 0903   LDLCALC 85 05/08/2017 0841    Physical Exam:    VS:   BP (!) 148/78   Pulse 63   Ht 6\' 1"  (1.854 m)   Wt 235 lb 9.6 oz (106.9 kg)   SpO2 98%  BMI 31.08 kg/m     Wt Readings from Last 3 Encounters:  11/19/17 235 lb 9.6 oz (106.9 kg)  10/09/17 239 lb 12.8 oz (108.8 kg)  09/20/17 238 lb (108 kg)     GEN:  Well nourished, well developed in no acute distress HEENT: Normal NECK: No JVD; No carotid bruits LYMPHATICS: No lymphadenopathy CARDIAC: RRR, no murmurs, rubs, gallops, CABG scar RESPIRATORY:  Clear to auscultation without rales, wheezing or rhonchi  ABDOMEN: Soft, non-tender, non-distended MUSCULOSKELETAL:  1+ edema edema; No deformity  SKIN: Warm and dry NEUROLOGIC:  Alert and oriented x 3 PSYCHIATRIC:  Normal affect   ASSESSMENT:    1. Essential hypertension   2. Chronic combined systolic and diastolic CHF (congestive heart failure) ([place])   3. S/P CABG x 4   4. Diabetes mellitus with coincident hypertension ([place])   5. OSA (obstructive sleep apnea)    PLAN:    In order of problems listed above:  CAD CABG 2017 - Nuclear stress test was performed in 2018 that was stable.  Managed medically.  Continue with aggressive secondary prevention.  Chronic systolic heart failure EF 45% with ischemic cardiomyopathy - Beta-blocker hydralazine Entresto low-dose because of chronic kidney disease, diuretic.  Symptoms much improved.  NYHA class I-II.  Lasix 80 twice a day  Hypertensive heart disease with heart failure - CKD stage IV has been limiting some of the choices and medication although he continues to take Entresto.  He has been seeing nephrology.  Creatinine has been 3.6-2.9 Been elevated BP. Will increase coreg to 25 BID.  Dr. Posey Pronto increased lasix.  80/80.  Watch for any signs of bradycardia.  Diabetes with chronic kidney disease and hypertension - Primary care physician has been following closely. Dr. Posey Pronto  Chronic anemia - Iron infusions, renal mediated as well.  Eye hemorrhage-Dr. Zadie Rhine. Right eye.   Sleep  disorder/OSA - Sleep study has been performed, obstructive sleep apnea noted by [name].  We will have him come back in and see Cecille Rubin in 4 months, me in 8 months  Medication Adjustments/Labs and Tests Ordered: Current medicines are reviewed at length with the patient today.  Concerns regarding medicines are outlined above.  Orders Placed This Encounter  Procedures  . EKG 12-Lead   Meds ordered this encounter  Medications  . carvedilol (COREG) 25 MG tablet    Sig: Take 1 tablet (25 mg total) by mouth 2 (two) times daily with a meal.    Dispense:  180 tablet    Refill:  3    Please consider 90 day supplies to promote better adherence    Patient Instructions  Medication Instructions:  Please increase your Carvedilol to 25 mg twice a day. Continue all other medications as listed.  If you need a refill on your cardiac medications before your next appointment, please call your pharmacy.   Follow-Up: At [facility], you and your health needs are our priority.  As part of our continuing mission to provide you with exceptional heart care, we have created designated Provider Care Teams.  These Care Teams include your primary Cardiologist (physician) and Advanced Practice Providers (APPs -  Physician Assistants and Nurse Practitioners) who all work together to provide you with the care you need, when you need it. . You will need a follow up appointment in 4 months with Truitt Merle, NP.   Please follow up with Dr Marlou Porch in 8 months. Please call our office 2 months in advance to schedule this appointment.  Thank you for choosing [facility]!!         Signed, Candee Furbish, MD  11/19/2017 11:40 AM    [place]

## 2017-11-22 ENCOUNTER — Other Ambulatory Visit: Payer: Self-pay | Admitting: Internal Medicine

## 2017-11-25 ENCOUNTER — Other Ambulatory Visit: Payer: Self-pay | Admitting: Cardiology

## 2017-11-26 ENCOUNTER — Ambulatory Visit (INDEPENDENT_AMBULATORY_CARE_PROVIDER_SITE_OTHER): Payer: Medicare Other | Admitting: Internal Medicine

## 2017-11-26 ENCOUNTER — Encounter: Payer: Self-pay | Admitting: Internal Medicine

## 2017-11-26 VITALS — BP 128/72 | HR 62 | Temp 97.7°F | Ht 73.0 in | Wt 238.0 lb

## 2017-11-26 DIAGNOSIS — Z23 Encounter for immunization: Secondary | ICD-10-CM

## 2017-11-26 DIAGNOSIS — D631 Anemia in chronic kidney disease: Secondary | ICD-10-CM

## 2017-11-26 DIAGNOSIS — N183 Chronic kidney disease, stage 3 unspecified: Secondary | ICD-10-CM

## 2017-11-26 DIAGNOSIS — E1169 Type 2 diabetes mellitus with other specified complication: Secondary | ICD-10-CM | POA: Diagnosis not present

## 2017-11-26 DIAGNOSIS — I10 Essential (primary) hypertension: Secondary | ICD-10-CM

## 2017-11-26 DIAGNOSIS — E1159 Type 2 diabetes mellitus with other circulatory complications: Secondary | ICD-10-CM | POA: Diagnosis not present

## 2017-11-26 DIAGNOSIS — G4730 Sleep apnea, unspecified: Secondary | ICD-10-CM

## 2017-11-26 DIAGNOSIS — E785 Hyperlipidemia, unspecified: Secondary | ICD-10-CM

## 2017-11-26 DIAGNOSIS — Z79899 Other long term (current) drug therapy: Secondary | ICD-10-CM

## 2017-11-26 DIAGNOSIS — E1121 Type 2 diabetes mellitus with diabetic nephropathy: Secondary | ICD-10-CM

## 2017-11-26 DIAGNOSIS — E1165 Type 2 diabetes mellitus with hyperglycemia: Secondary | ICD-10-CM | POA: Diagnosis not present

## 2017-11-26 DIAGNOSIS — I5022 Chronic systolic (congestive) heart failure: Secondary | ICD-10-CM

## 2017-11-26 DIAGNOSIS — I255 Ischemic cardiomyopathy: Secondary | ICD-10-CM

## 2017-11-26 DIAGNOSIS — IMO0002 Reserved for concepts with insufficient information to code with codable children: Secondary | ICD-10-CM

## 2017-11-26 DIAGNOSIS — I152 Hypertension secondary to endocrine disorders: Secondary | ICD-10-CM

## 2017-11-26 NOTE — Progress Notes (Signed)
Patient ID: Henry Carroll, male   DOB: 10/21/1950, 67 y.o.   MRN: [id number]   Location:  [facility] OFFICE  Provider: DR Arletha Grippe  Code Status:  Goals of Care:  Advanced Directives 09/26/2017  Does Patient Have a Medical Advance Directive? No  Type of Advance Directive -  Does patient want to make changes to medical advance directive? No - Patient declined  Copy of [place] in Chart? -  Would patient like information on creating a medical advance directive? No - Patient declined     Chief Complaint  Patient presents with  . Medical Management of Chronic Issues    6-7 month follow-up  . Immunizations    Flu Vaccine     HPI: Patient is a 67 y.o. male seen today for medical management of chronic diseases.  Since last ov, he had sudden lost of OD vision and was told he had bleeding vessels that req'd surgical repair. He then had diabetic changes in OS that req'd laser tx. He had slow healing from OS procedure and was told he probably had sleep apnea. F/u tests (sleep study) from cardio revealed sleep apnea and he was told he need to start CPAP use. He saw nephrology and BP meds adjusted due to LE edema.  multivessel CAD - stable. He underwent CABG x 4 vessels by Dr Charlott Rakes on 02/18/15. Vein harvested from right leg. ABIs 0.62 on right and 0.28 on left.  Followed by cardio Dr Marlou Porch. Released from CT surgery. Completed cardiac rehab in July 2017. Currently no new CP/SOB. He is still golfing.  Chronic systolic CHF - weight 409 (up 11 lbs). He started [facility] in late January 2019. Followed by cardio Dr Marlou Porch. 2D echo in Oct 2018 revealed improved EF 40-45% (prev 35-40% with diffuse hypokinesis and grade 2 DD in 12/2015); mild MR/mild dilated LA; mod TR; mild pulm HTN with PA pressure 44 mm Hg. He is NYHA class 1-2. He takes furosemide 160mg  in AM and 80mg  in PM.  DM - BS fluctuating at home but usually <160.  No low BS reactions. He has not needed novolog. No numbness  or tingling. Takes prn novolog and levemir. Uses 54 units levemir BID most days. A1c 7.6% (prev 10.1%); urine microalbumin/Cr ratio 2,071; LDL 130. 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 Takes ASA daily. Followed by cardio Dr Marlou Porch  Hyperlipidemia - stable on lipitor. No myalgias. LDL 130  Asthma - mild wheezing on advair and albuterol neb. Takes singulair. He has not used antihistamine or flonase yet  CKD - stage 4. followed by nephrology Dr Posey Pronto [facility]). Cr 2.88; BP meds recently adjusted to 160mg  lasix in AM and 80mg  in PM; hydralazine 100mg  BID  AOCD - Hgb 10.6. In the past, he has been transfused with PRBCs and rec'd iron infusions. Ferritin 88; iron 49. Followed by hematology Dr Tera Helper  [facility] - followed by hematology; [facility] 117 with LFLC 59.3 and ratio 1.97; beta 2 microglobulin 5.0; M spike 0.5 (prec 0.3); LDH 258  Last albumin 3.5; total protein 6.9   Past Medical History:  Diagnosis Date  . Anemia   . 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 Asthma   . Chronic combined systolic (congestive) and diastolic (congestive) heart failure ([place])    a. 12/31/14: 2D ECHO: EF 40-45%, HK of inf myocardium, G1DD, mod MR  . CKD (chronic kidney disease), stage III ([place])  stage 3 kidney disease  . Coronary artery disease    a. LHC 01/2015 - triple vessel CAD (mod oLM, mLAD, severe mRCA, intermediate branch stenosis, CTO of mCx). Plan CABG 02/2015.  Marland Kitchen GERD (gastroesophageal reflux disease)   . Hyperlipidemia   . Hypertension   . Mitral regurgitation    a. Mild-mod by echo 12/2014.  Marland Kitchen Myocardial infarction [facility])    pt. states per [name] he has in the past  . NSVT (nonsustained ventricular tachycardia) ([place])    a. 9 beats during 01/2015 adm. BB titrated.  . Obesity    a. BMI 33  . Type II diabetes mellitus ([place])     Past Surgical History:  Procedure Laterality Date  . BACK SURGERY    . CARDIAC CATHETERIZATION N/A  02/09/2015   Procedure: Left Heart Cath and Coronary Angiography;  Surgeon: Burnell Blanks, MD;  Location: [place] CV LAB;  Service: Cardiovascular;  Laterality: N/A;  . COLONOSCOPY  2011   Dr. [name]: Ileocecal valve appeared normal, scattered pancolonic diverticulosis, difficult bowel prep making smaller lesions potentially missed. Recommended three-year follow-up colonoscopy.  . COLONOSCOPY  2003   Dr. [name]: Suspicious lesion at the ileocecal valve, multiple biopsies benign, pancolonic diverticulosis.  . COLONOSCOPY N/A 02/15/2016   diverticulosis in sigmoid and descending colon, single 5 mm polyp at splenic flexure (Tubular adenoma)  . CORONARY ARTERY BYPASS GRAFT N/A 02/18/2015   Procedure: CORONARY ARTERY BYPASS GRAFTING (CABG) x  four, using bilateral internal mammary arteries and right leg greater saphenous vein harvested endoscopically;  Surgeon: [name], MD;  Location: [place] OR;  Service: Open Heart Surgery;  Laterality: N/A;  . ESOPHAGOGASTRODUODENOSCOPY N/A 02/15/2016   normal  . GIVENS CAPSULE STUDY N/A 01/08/2017   couple of gastric and small bowel eroions in setting of aspirin 81 mg daily but nothing concerning, continue Hematology follow-up  . HERNIA REPAIR  0017   Umbilical  . LUMBAR [place] SURGERY  2006   L4 & L5  . TEE WITHOUT CARDIOVERSION N/A 02/18/2015   Procedure: TRANSESOPHAGEAL ECHOCARDIOGRAM (TEE);  Surgeon: [name], MD;  Location: [place];  Service: Open Heart Surgery;  Laterality: N/A;  . TONSILLECTOMY  1962     reports that he quit smoking about 2 years ago. His smoking use included cigars. He quit after 17.00 years of use. He has never used smokeless tobacco. He reports that he drinks alcohol. He reports that he does not use drugs. Social History   Socioeconomic History  . Marital status: Married    Spouse name: Not on file  . Number of children: Not on file  . Years of education: Not on file  . Highest education level: Not on file    Occupational History  . Not on file  Social Needs  . Financial resource strain: Not hard at all  . Food insecurity:    Worry: Never true    Inability: Never true  . Transportation needs:    Medical: No    Non-medical: No  Tobacco Use  . Smoking status: Former Smoker    Years: 17.00    Types: Cigars    Last attempt to quit: 12/31/2014    Years since quitting: 2.9  . Smokeless tobacco: Never Used  Substance and Sexual Activity  . Alcohol use: Yes    Alcohol/week: 0.0 standard drinks    Comment: occasional glass of wine.  . Drug use: No  . Sexual activity: Yes    Birth control/protection: None  Lifestyle  .  Physical activity:    Days per week: 5 days    Minutes per session: 30 min  . Stress: Only a little  Relationships  . Social connections:    Talks on phone: More than three times a week    Gets together: More than three times a week    Attends religious service: More than 4 times per year    Active member of club or organization: Yes    Attends meetings of clubs or organizations: 1 to 4 times per year    Relationship status: Married  . Intimate partner violence:    Fear of current or ex partner: No    Emotionally abused: No    Physically abused: No    Forced sexual activity: No  Other Topics Concern  . Not on file  Social History Narrative  . Not on file    Family History  Problem Relation Age of Onset  . Diabetes Mother   . Heart disease Mother        later in life, age >36  . Heart disease Father        heart failure later in life  . Hypertension Father   . Stroke Sister   . Kidney disease Daughter   . Sudden death Daughter   . Colon cancer Paternal Uncle        Passed from colon CA  . Heart attack Neg Hx     No Known Allergies  Outpatient Encounter Medications as of 11/26/2017  Medication Sig  . acetaminophen (TYLENOL) 500 MG tablet Take 1,000 mg by mouth 2 (two) times daily as needed for moderate pain.  Marland Kitchen albuterol (PROVENTIL) (2.5 MG/3ML)  0.083% nebulizer solution USE ONE VIAL IN NEBULIZER EVERY 6 HOURS AS NEEDED FOR WHEEZING FOR SHORTNESS OF BREATH  . amLODipine (NORVASC) 10 MG tablet TAKE 1 TABLET BY MOUTH ONCE DAILY  . aspirin EC 81 MG tablet Take 1 tablet (81 mg total) by mouth daily.  Marland Kitchen atorvastatin (LIPITOR) 40 MG tablet TAKE 1 TABLET BY MOUTH EVERY EVENING  . carvedilol (COREG) 25 MG tablet Take 25 mg by mouth 2 (two) times daily with a meal.  . Cholecalciferol ([employer] VITAMIN D3) 1000 UNITS capsule Take 2 capsules (2,000 Units total) by mouth daily.  Marland Kitchen CINNAMON PO Take 1,000 mg by mouth 2 (two) times daily.   . Coenzyme Q10 (CO Q 10 PO) Take 200 mg by mouth daily.   Marland Kitchen ENTRESTO 24-26 MG TAKE 1 TABLET BY MOUTH TWICE DAILY  . fluticasone (FLONASE) 50 MCG/ACT nasal spray Place 2 sprays into both nostrils daily as needed for allergies or rhinitis (SEASONAL ALLERGIES).  . Fluticasone-Salmeterol (ADVAIR DISKUS) 100-50 MCG/DOSE AEPB Inhale 1 puff into the lungs 2 (two) times daily.  . furosemide (LASIX) 40 MG tablet Take 80 mg by mouth 2 (two) times daily.  Marland Kitchen glucose blood (ONE TOUCH ULTRA TEST) test strip Use one strip to check glucose three times daily. Dx: E11.29  . hydrALAZINE (APRESOLINE) 50 MG tablet Take 1 tablet (50 mg total) by mouth 2 (two) times daily.  . insulin detemir (LEVEMIR) 100 UNIT/ML injection Inject 0.56 mLs (56 Units total) into the skin daily. E11.65  . montelukast (SINGULAIR) 10 MG tablet TAKE 1 TABLET BY MOUTH ONCE DAILY AT BEDTIME  . Multiple Vitamins-Minerals (ONE-A-DAY MENS 50+ ADVANTAGE) TABS Take 1 tablet by mouth daily.  . nitroGLYCERIN (NITROSTAT) 0.4 MG SL tablet Place 1 tablet (0.4 mg total) under the tongue every 5 (five) minutes as needed for  chest pain.  Marland Kitchen PROAIR HFA 108 (90 Base) MCG/ACT inhaler INHALE 2 PUFFS INTO LUNGS EVERY 6 HOURS AS NEEDED FOR SHORTNESS OF BREATH  . terbinafine (LAMISIL) 1 % cream Apply 1 application topically daily as needed (foot).   . [DISCONTINUED] carvedilol (COREG) 25  MG tablet Take 1 tablet (25 mg total) by mouth 2 (two) times daily with a meal.   Facility-Administered Encounter Medications as of 11/26/2017  Medication  . regadenoson (LEXISCAN) injection SOLN 0.4 mg    Review of Systems:  Review of Systems  Eyes: Positive for visual disturbance.  Cardiovascular: Positive for leg swelling.  All other systems reviewed and are negative.   Health Maintenance  Topic Date Due  . INFLUENZA VACCINE  09/12/2017  . HEMOGLOBIN A1C  11/08/2017  . Hepatitis C Screening  02/12/2018 (Originally 11-21-1950)  . OPHTHALMOLOGY EXAM  06/26/2018  . TETANUS/TDAP  03/25/2021  . COLONOSCOPY  02/14/2026  . PNA vac Low Risk Adult  Completed    Physical Exam: Vitals:   11/26/17 1347  Temp: 97.7 F (36.5 C)  TempSrc: Oral  Weight: 238 lb (108 kg)  Height: 6\' 1"  (1.854 m)   Body mass index is 31.4 kg/m. Physical Exam  Constitutional: He is oriented to person, place, and time. He appears well-developed and well-nourished.  HENT:  Mouth/Throat: Oropharynx is clear and moist.  MMM; no oral thrush  Eyes: Pupils are equal, round, and reactive to light. No scleral icterus.  OS tearing noted  Neck: Neck supple. Carotid bruit is not present. No thyromegaly present.  Cardiovascular: Normal rate, regular rhythm and intact distal pulses. Exam reveals no gallop and no friction rub.  Murmur (1/6 SEM) heard. Trace LE edema b/l; no calf TTP  Pulmonary/Chest: Effort normal and breath sounds normal. He has no wheezes. He has no rales. He exhibits no tenderness.  Abdominal: Soft. Bowel sounds are normal. He exhibits no distension, no abdominal bruit, no pulsatile midline mass and no mass. There is no hepatomegaly. There is no tenderness. There is no rebound and no guarding.  obese  Musculoskeletal: He exhibits edema (small joints).  Lymphadenopathy:    He has no cervical adenopathy.  Neurological: He is alert and oriented to person, place, and time. He has normal reflexes.    Skin: Skin is warm and dry. No rash noted.  Psychiatric: He has a normal mood and affect. His behavior is normal. Judgment and thought content normal.    Labs reviewed: Basic Metabolic Panel: Recent Labs    05/13/17 1326 09/16/17 1512 10/09/17 1120  NA 138 139 143  K 4.9 4.2 4.3  CL 103 104 106  CO2 26 29 23   GLUCOSE 135* 130* 164*  BUN 55* 58* 37*  CREATININE 2.42* 3.64* 2.96*  CALCIUM 9.5 9.1 8.4*   Liver Function Tests: Recent Labs    03/07/17 1035 05/13/17 1326 09/16/17 1512  AST 21 21 32  ALT 17 19 25   ALKPHOS 61 68 75  BILITOT 0.3 0.5 0.5  PROT 6.3* 6.7 6.9  ALBUMIN 3.4* 3.4* 3.5   No results for input(s): LIPASE, AMYLASE in the last 8760 hours. No results for input(s): AMMONIA in the last 8760 hours. CBC: Recent Labs    03/07/17 1035 05/13/17 1326 09/16/17 1512  WBC 3.5* 4.8 4.0  NEUTROABS 1.8 3.2 2.5  HGB 9.6* 10.5* 10.6*  HCT 28.5* 30.9* 31.5*  MCV 93.1 94.5 92.1  PLT 269 288 257   Lipid Panel: Recent Labs    01/09/17 1148 05/08/17 0841  CHOL 195 144  HDL 44 35*  LDLCALC 122* 85  TRIG 177* 137  CHOLHDL 4.4 4.1   Lab Results  Component Value Date   HGBA1C 7.6 (H) 05/08/2017    Procedures since last visit: No results found.  Assessment/Plan   ICD-10-CM   1. Uncontrolled type 2 diabetes mellitus with diabetic nephropathy, without long-term current use of insulin (HCC) E11.21 Hemoglobin A1c   E11.65 Microalbumin/Creatinine Ratio, Urine  2. High risk medication use Z79.899   3. Hyperlipidemia associated with type 2 diabetes mellitus ([place]) E11.69    E78.5   4. Sleep apnea, unspecified type G47.30   5. Hypertension associated with diabetes ([place]) E11.59    I10   6. Chronic systolic heart failure (HCC) I50.22   7. Anemia in stage 3 chronic kidney disease (HCC) N18.3    D63.1    GET OFFICE NOTES FROM DR Zadie Rhine ([place])  Continue current medications as ordered  Will call with lab results  Follow up with  specialists as scheduled  Use CPAP every night as directed once nasal mask available  Flu shot given today  Follow up in 3 mos with Sherrie Mustache, NP for routine visit.     Nahal Wanless S. Perlie Gold  [facility] [address] [place], [place] [address] [phone] Cell (Monday-Friday 8 AM - 5 PM) [phone] After 5 PM and follow prompts

## 2017-11-26 NOTE — Patient Instructions (Addendum)
GET OFFICE NOTES FROM DR Zadie Rhine ([facility])  Continue current medications as ordered  Will call with lab results  Follow up with specialists as scheduled  Use CPAP every night as directed once nasal mask available  Flu shot given today  Follow up in 3 mos with Sherrie Mustache, NP for routine visit.

## 2017-11-27 ENCOUNTER — Encounter: Payer: Self-pay | Admitting: Internal Medicine

## 2017-11-27 LAB — HEMOGLOBIN A1C
HEMOGLOBIN A1C: 8 %{Hb} — AB (ref <5.7)
Mean Plasma Glucose: 183 (calc)
eAG (mmol/L): 10.1 (calc)

## 2017-11-27 NOTE — Telephone Encounter (Addendum)
Patient has a 10 week follow up appointment scheduled for 12/5/ 2019 at 9 am.. Patient understands he needs to keep this appointment for insurance compliance. Patient was grateful for the call and thanked me.

## 2017-12-02 ENCOUNTER — Telehealth: Payer: Self-pay

## 2017-12-02 ENCOUNTER — Other Ambulatory Visit: Payer: Self-pay | Admitting: Internal Medicine

## 2017-12-02 NOTE — Telephone Encounter (Signed)
Discussed results with patient, patient verbalized understanding of results  Updated medication list to reflect dose change in Levemir

## 2017-12-02 NOTE — Telephone Encounter (Signed)
-----   Message from [place], [place] sent at 11/30/2017  9:20 PM EDT ----- Blood sugar uncontrolled - increase levemir to 60 units daily as ordered; watch complex carbs; follow up as scheduled

## 2017-12-03 ENCOUNTER — Other Ambulatory Visit: Payer: Self-pay | Admitting: Nurse Practitioner

## 2017-12-04 ENCOUNTER — Other Ambulatory Visit: Payer: Self-pay | Admitting: *Deleted

## 2017-12-04 MED ORDER — GLUCOSE BLOOD VI STRP: ORAL_STRIP | 1 refills | Status: AC

## 2017-12-04 NOTE — Telephone Encounter (Signed)
[facility] [place].  

## 2017-12-08 ENCOUNTER — Other Ambulatory Visit: Payer: Self-pay | Admitting: Internal Medicine

## 2017-12-08 DIAGNOSIS — J4521 Mild intermittent asthma with (acute) exacerbation: Secondary | ICD-10-CM

## 2017-12-24 DIAGNOSIS — E113511 Type 2 diabetes mellitus with proliferative diabetic retinopathy with macular edema, right eye: Secondary | ICD-10-CM | POA: Diagnosis not present

## 2017-12-24 DIAGNOSIS — E113512 Type 2 diabetes mellitus with proliferative diabetic retinopathy with macular edema, left eye: Secondary | ICD-10-CM | POA: Diagnosis not present

## 2017-12-24 DIAGNOSIS — Z09 Encounter for follow-up examination after completed treatment for conditions other than malignant neoplasm: Secondary | ICD-10-CM | POA: Diagnosis not present

## 2017-12-30 DIAGNOSIS — E113512 Type 2 diabetes mellitus with proliferative diabetic retinopathy with macular edema, left eye: Secondary | ICD-10-CM | POA: Diagnosis not present

## 2017-12-30 DIAGNOSIS — E113511 Type 2 diabetes mellitus with proliferative diabetic retinopathy with macular edema, right eye: Secondary | ICD-10-CM | POA: Diagnosis not present

## 2017-12-30 DIAGNOSIS — H3561 Retinal hemorrhage, right eye: Secondary | ICD-10-CM | POA: Diagnosis not present

## 2018-01-06 DIAGNOSIS — N183 Chronic kidney disease, stage 3 (moderate): Secondary | ICD-10-CM | POA: Diagnosis not present

## 2018-01-08 DIAGNOSIS — D649 Anemia, unspecified: Secondary | ICD-10-CM | POA: Diagnosis not present

## 2018-01-08 DIAGNOSIS — N184 Chronic kidney disease, stage 4 (severe): Secondary | ICD-10-CM | POA: Diagnosis not present

## 2018-01-08 DIAGNOSIS — N183 Chronic kidney disease, stage 3 (moderate): Secondary | ICD-10-CM | POA: Diagnosis not present

## 2018-01-08 DIAGNOSIS — I129 Hypertensive chronic kidney disease with stage 1 through stage 4 chronic kidney disease, or unspecified chronic kidney disease: Secondary | ICD-10-CM | POA: Diagnosis not present

## 2018-01-16 ENCOUNTER — Encounter: Payer: Self-pay | Admitting: Cardiology

## 2018-01-16 ENCOUNTER — Telehealth: Payer: Self-pay | Admitting: *Deleted

## 2018-01-16 ENCOUNTER — Ambulatory Visit (INDEPENDENT_AMBULATORY_CARE_PROVIDER_SITE_OTHER): Payer: Medicare Other | Admitting: Cardiology

## 2018-01-16 VITALS — BP 190/86 | HR 78 | Ht 73.0 in | Wt 240.8 lb

## 2018-01-16 DIAGNOSIS — E669 Obesity, unspecified: Secondary | ICD-10-CM | POA: Diagnosis not present

## 2018-01-16 DIAGNOSIS — G4733 Obstructive sleep apnea (adult) (pediatric): Secondary | ICD-10-CM

## 2018-01-16 DIAGNOSIS — I10 Essential (primary) hypertension: Secondary | ICD-10-CM

## 2018-01-16 DIAGNOSIS — I255 Ischemic cardiomyopathy: Secondary | ICD-10-CM | POA: Diagnosis not present

## 2018-01-16 HISTORY — DX: Obstructive sleep apnea (adult) (pediatric): G47.33

## 2018-01-16 NOTE — Progress Notes (Signed)
Cardiology Office Note:    Date:  01/16/2018   ID:  Henry Carroll, DOB 22-Jul-1950, MRN [id number]  PCP:  [name], NP  Cardiologist:  Candee Furbish, MD    Referring MD: Gildardo Cranker, DO   Chief Complaint  Patient presents with  . Sleep Apnea  . Hypertension    History of Present Illness:    Henry Carroll is a 67 y.o. male with a hx of CHF, hypertension, CAD who was referred for sleep study due to snoring.  Epworth sleepiness score was 9.  He was found to have mild obstructive sleep apnea with an AHI of 7.3/h and no significant central sleep apnea.  He had severe obstructive sleep apnea during REM sleep with an AHI of 34.3/h.  He underwent CPAP titration to 6 cm H2O.  He is now here for follow-up.He is doing well with his CPAP device and thinks that he has gotten used to it.  He initially was using a nasal mask but he did not tolerate it due to comfort and switch to a full facemask which is doing much better.  He had not been using his device early on as much as he should have because he did not tolerate the nasal mask but over the past 2 weeks the full facemask is done much better and he is now using his device more hours at night and starting to notice that he feels more rested in the morning and has not had any more snoring.  Since going on CPAP he feels rested in the am and has no significant daytime sleepiness.  He denies any significant mouth or nasal dryness or nasal congestion.  He does not think that he snores.     Past Medical History:  Diagnosis Date  . Anemia   . 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 Asthma   . Chronic combined systolic (congestive) and diastolic (congestive) heart failure ([place])    a. 12/31/14: 2D ECHO: EF 40-45%, HK of inf myocardium, G1DD, mod MR  . CKD (chronic kidney disease), stage III (HCC)    stage 3 kidney disease  . Coronary artery disease    a. LHC 01/2015 - triple vessel CAD (mod oLM, mLAD, severe mRCA, intermediate branch  stenosis, CTO of mCx). Plan CABG 02/2015.  Marland Kitchen GERD (gastroesophageal reflux disease)   . Hyperlipidemia   . Hypertension   . Mitral regurgitation    a. Mild-mod by echo 12/2014.  Marland Kitchen Myocardial infarction [facility])    pt. states per [name] he has in the past  . NSVT (nonsustained ventricular tachycardia) ([place])    a. 9 beats during 01/2015 adm. BB titrated.  . Obesity    a. BMI 33  . OSA (obstructive sleep apnea) 01/16/2018   Mild obstructive sleep apnea overall with an AHI of 7.3/h and no significant central sleep apnea. Severe obstructive sleep apnea during REM sleep with an AHI of 34.3/h.  Now on CPAP at 6 cm H2O.   . Sleep apnea    2019, Dr.Turner diagnoised   . Type II diabetes mellitus ([place])     Past Surgical History:  Procedure Laterality Date  . BACK SURGERY    . CARDIAC CATHETERIZATION N/A 02/09/2015   Procedure: Left Heart Cath and Coronary Angiography;  Surgeon: Burnell Blanks, MD;  Location: [facility];  Service: Cardiovascular;  Laterality: N/A;  . COLONOSCOPY  2011   Dr. [name]: Ileocecal valve appeared normal, scattered pancolonic diverticulosis,  difficult bowel prep making smaller lesions potentially missed. Recommended three-year follow-up colonoscopy.  . COLONOSCOPY  2003   Dr. [name]: Suspicious lesion at the ileocecal valve, multiple biopsies benign, pancolonic diverticulosis.  . COLONOSCOPY N/A 02/15/2016   diverticulosis in sigmoid and descending colon, single 5 mm polyp at splenic flexure (Tubular adenoma)  . CORONARY ARTERY BYPASS GRAFT N/A 02/18/2015   Procedure: CORONARY ARTERY BYPASS GRAFTING (CABG) x  four, using bilateral internal mammary arteries and right leg greater saphenous vein harvested endoscopically;  Surgeon: [name], MD;  Location: [facility];  Service: Open Heart Surgery;  Laterality: N/A;  . ESOPHAGOGASTRODUODENOSCOPY N/A 02/15/2016   normal  . EYE SURGERY Right    July 2019   . GIVENS CAPSULE STUDY N/A 01/08/2017   couple of gastric  and small bowel eroions in setting of aspirin 81 mg daily but nothing concerning, continue Hematology follow-up  . HERNIA REPAIR  [id number]   Umbilical  . LUMBAR [place] SURGERY  2006   L4 & L5  . REFRACTIVE SURGERY Left    2019   . REFRACTIVE SURGERY Right    2019  . TEE WITHOUT CARDIOVERSION N/A 02/18/2015   Procedure: TRANSESOPHAGEAL ECHOCARDIOGRAM (TEE);  Surgeon: [name], MD;  Location: [facility];  Service: Open Heart Surgery;  Laterality: N/A;  . TONSILLECTOMY  1962    Current Medications: Current Meds  Medication Sig  . acetaminophen (TYLENOL) 500 MG tablet Take 1,000 mg by mouth 2 (two) times daily as needed for moderate pain.  Marland Kitchen albuterol (PROVENTIL) (2.5 MG/3ML) 0.083% nebulizer solution USE ONE VIAL IN NEBULIZER EVERY 6 HOURS AS NEEDED FOR WHEEZING FOR SHORTNESS OF BREATH  . amLODipine (NORVASC) 10 MG tablet TAKE 1 TABLET BY MOUTH ONCE DAILY  . aspirin EC 81 MG tablet Take 1 tablet (81 mg total) by mouth daily.  Marland Kitchen atorvastatin (LIPITOR) 40 MG tablet TAKE 1 TABLET BY MOUTH EVERY EVENING  . carvedilol (COREG) 25 MG tablet Take 25 mg by mouth 2 (two) times daily with a meal.  . Cholecalciferol ([employer] VITAMIN D3) 1000 UNITS capsule Take 2 capsules (2,000 Units total) by mouth daily.  Marland Kitchen CINNAMON PO Take 1,000 mg by mouth 2 (two) times daily.   . Coenzyme Q10 (CO Q 10 PO) Take 200 mg by mouth daily.   Marland Kitchen ENTRESTO 24-26 MG TAKE 1 TABLET BY MOUTH TWICE DAILY  . fluticasone (FLONASE) 50 MCG/ACT nasal spray Place 2 sprays into both nostrils daily as needed for allergies or rhinitis (SEASONAL ALLERGIES).  . Fluticasone-Salmeterol (ADVAIR DISKUS) 100-50 MCG/DOSE AEPB Inhale 1 puff into the lungs 2 (two) times daily.  . furosemide (LASIX) 40 MG tablet TAKE 1 & 1/2 (ONE & ONE-HALF) TABLETS BY MOUTH TWICE DAILY  . glucose blood (ONE TOUCH ULTRA TEST) test strip Use to test blood sugar three times daily. Dx: E11.21  . insulin detemir (LEVEMIR) 100 UNIT/ML injection Inject 60 Units into the skin  daily. E11.65  . montelukast (SINGULAIR) 10 MG tablet TAKE 1 TABLET BY MOUTH ONCE DAILY AT BEDTIME  . Multiple Vitamins-Minerals (ONE-A-DAY MENS 50+ ADVANTAGE) TABS Take 1 tablet by mouth daily.  . nitroGLYCERIN (NITROSTAT) 0.4 MG SL tablet Place 1 tablet (0.4 mg total) under the tongue every 5 (five) minutes as needed for chest pain.  Marland Kitchen PROAIR HFA 108 (90 Base) MCG/ACT inhaler INHALE 2 PUFFS INTO LUNGS EVERY 6 HOURS AS NEEDED FOR SHORTNESS OF BREATH  . terbinafine (LAMISIL) 1 % cream Apply 1 application topically daily as needed (foot).  Allergies:   Patient has no known allergies.   Social History   Socioeconomic History  . Marital status: Married    Spouse name: Not on file  . Number of children: Not on file  . Years of education: Not on file  . Highest education level: Not on file  Occupational History  . Not on file  Social Needs  . Financial resource strain: Not hard at all  . Food insecurity:    Worry: Never true    Inability: Never true  . Transportation needs:    Medical: No    Non-medical: No  Tobacco Use  . Smoking status: Former Smoker    Years: 17.00    Types: Cigars    Last attempt to quit: 12/31/2014    Years since quitting: 3.0  . Smokeless tobacco: Never Used  Substance and Sexual Activity  . Alcohol use: Yes    Alcohol/week: 0.0 standard drinks    Comment: occasional glass of wine.  . Drug use: No  . Sexual activity: Yes    Birth control/protection: None  Lifestyle  . Physical activity:    Days per week: 5 days    Minutes per session: 30 min  . Stress: Only a little  Relationships  . Social connections:    Talks on phone: More than three times a week    Gets together: More than three times a week    Attends religious service: More than 4 times per year    Active member of club or organization: Yes    Attends meetings of clubs or organizations: 1 to 4 times per year    Relationship status: Married  Other Topics Concern  . Not on file    Social History Narrative  . Not on file     Family History: The patient's family history includes Colon cancer in his paternal uncle; Diabetes in his mother; Heart disease in his father and mother; Hypertension in his father; Kidney disease in his daughter; Stroke in his sister; Sudden death in his daughter. There is no history of Heart attack.  ROS:   Please see the history of present illness.    ROS  All other systems reviewed and negative.   EKGs/Labs/Other Studies Reviewed:    The following studies were reviewed today: PAP download  EKG:  EKG is not ordered today.   Recent Labs: 09/16/2017: ALT 25; Hemoglobin 10.6; Platelets 257 10/09/2017: BUN 37; Creatinine, Ser 2.96; Potassium 4.3; Sodium 143   Recent Lipid Panel    Component Value Date/Time   CHOL 144 05/08/2017 0841   CHOL 149 06/01/2015 1500   TRIG 137 05/08/2017 0841   HDL 35 (L) 05/08/2017 0841   HDL 45 06/01/2015 1500   CHOLHDL 4.1 05/08/2017 0841   VLDL 33 (H) 09/26/2016 0903   LDLCALC 85 05/08/2017 0841    Physical Exam:    VS:  BP (!) 190/86 Comment: 200/100 automatic BP Right arm  Pulse 78   Ht 6\' 1"  (1.854 m)   Wt 240 lb 12.8 oz (109.2 kg)   SpO2 97%   BMI 31.77 kg/m     Wt Readings from Last 3 Encounters:  01/16/18 240 lb 12.8 oz (109.2 kg)  11/26/17 238 lb (108 kg)  11/19/17 235 lb 9.6 oz (106.9 kg)     GEN:  Well nourished, well developed in no acute distress HEENT: Normal NECK: No JVD; No carotid bruits LYMPHATICS: No lymphadenopathy CARDIAC: RRR, no murmurs, rubs, gallops RESPIRATORY:  Clear to auscultation  without rales, wheezing or rhonchi  ABDOMEN: Soft, non-tender, non-distended MUSCULOSKELETAL:  No edema; No deformity  SKIN: Warm and dry NEUROLOGIC:  Alert and oriented x 3 PSYCHIATRIC:  Normal affect   ASSESSMENT:    1. OSA (obstructive sleep apnea)   2. Essential hypertension   3. Obesity (BMI 30-39.9)    PLAN:    In order of problems listed above:  1.  OSA - the  patient is tolerating PAP therapy well without any problems. The PAP download was reviewed today and showed an AHI of 6.4/hr on 6 cm H2O with 43% compliance in using more than 4 hours nightly over the past week.  He initially had a nasal mask which he did not tolerate and about 2 weeks ago switched to a full facemask which he is tolerating very well and is now using his mask pretty much throughout the night and is now seeing much more benefit to using the Pap device with less daytime sleepiness and no further snoring.  The patient has been using and benefiting from PAP use and will continue to benefit from therapy. I am going to increase CPAP to 8cm H2O.  I have asked him to tighten his mask to avoid leaks and to avoid sleeping supine.  I will repeat a download in 2 weeks.   2.  HTN - BP is elevated on exam today.  He will continue on Carvedilol 25 mg twice daily, amlodipine 10 mg daily, Entresto 24-26 mg twice daily and hydralazine 50 mg twice daily.  I have asked him to check his blood pressure daily for a week around lunchtime and call me with the results.  3.  Obesity - I have encouraged him to get into a routine exercise program and cut back on carbs and portions.    Medication Adjustments/Labs and Tests Ordered: Current medicines are reviewed at length with the patient today.  Concerns regarding medicines are outlined above.  No orders of the defined types were placed in this encounter.  No orders of the defined types were placed in this encounter.   Signed, Fransico Him, MD  01/16/2018 9:34 AM    [place]

## 2018-01-16 NOTE — Patient Instructions (Addendum)
Medication Instructions:  Your physician recommends that you continue on your current medications as directed. Please refer to the Current Medication list given to you today.  If you need a refill on your cardiac medications before your next appointment, please call your pharmacy.   Lab work:  If you have labs (blood work) drawn today and your tests are completely normal, you will receive your results only by: Marland Kitchen MyChart Message (if you have MyChart) OR . A paper copy in the mail If you have any lab test that is abnormal or we need to change your treatment, we will call you to review the results.  Follow-Up: At [facility], you and your health needs are our priority.  As part of our continuing mission to provide you with exceptional heart care, we have created designated Provider Care Teams.  These Care Teams include your primary Cardiologist (physician) and Advanced Practice Providers (APPs -  Physician Assistants and Nurse Practitioners) who all work together to provide you with the care you need, when you need it. You will need a follow up appointment in 1 years.  Please call our office 2 months in advance to schedule this appointment.  You may see [name]   Take blood pressure daily for 1 week, 2 hours after medication and call the office with the results.

## 2018-01-16 NOTE — Telephone Encounter (Signed)
Order placed to [facility]  

## 2018-01-16 NOTE — Telephone Encounter (Signed)
-----   Message from Sarina Ill, RN sent at 01/16/2018  3:49 PM EST ----- Regarding: Sleep Hello, [name] ordered a change in CPAP to 8 cm H2O and a download in 2 weeks. Orders placed, please send. Thanks, [employer]

## 2018-01-20 ENCOUNTER — Other Ambulatory Visit (HOSPITAL_COMMUNITY): Payer: Medicare Other

## 2018-01-22 ENCOUNTER — Ambulatory Visit (HOSPITAL_COMMUNITY): Payer: Medicare Other | Admitting: Hematology

## 2018-02-10 ENCOUNTER — Inpatient Hospital Stay (HOSPITAL_COMMUNITY): Payer: Medicare Other | Attending: Hematology

## 2018-02-10 DIAGNOSIS — D472 Monoclonal gammopathy: Secondary | ICD-10-CM | POA: Insufficient documentation

## 2018-02-10 DIAGNOSIS — D509 Iron deficiency anemia, unspecified: Secondary | ICD-10-CM | POA: Diagnosis not present

## 2018-02-10 DIAGNOSIS — H3561 Retinal hemorrhage, right eye: Secondary | ICD-10-CM | POA: Diagnosis not present

## 2018-02-10 DIAGNOSIS — E113511 Type 2 diabetes mellitus with proliferative diabetic retinopathy with macular edema, right eye: Secondary | ICD-10-CM | POA: Diagnosis not present

## 2018-02-10 LAB — CBC WITH DIFFERENTIAL/PLATELET
ABS IMMATURE GRANULOCYTES: 0.01 10*3/uL (ref 0.00–0.07)
BASOS PCT: 1 %
Basophils Absolute: 0 10*3/uL (ref 0.0–0.1)
Eosinophils Absolute: 0.1 10*3/uL (ref 0.0–0.5)
Eosinophils Relative: 3 %
HCT: 30.5 % — ABNORMAL LOW (ref 39.0–52.0)
Hemoglobin: 10 g/dL — ABNORMAL LOW (ref 13.0–17.0)
Immature Granulocytes: 0 %
Lymphocytes Relative: 22 %
Lymphs Abs: 0.9 10*3/uL (ref 0.7–4.0)
MCH: 31.6 pg (ref 26.0–34.0)
MCHC: 32.8 g/dL (ref 30.0–36.0)
MCV: 96.5 fL (ref 80.0–100.0)
MONO ABS: 0.3 10*3/uL (ref 0.1–1.0)
Monocytes Relative: 8 %
NEUTROS ABS: 2.7 10*3/uL (ref 1.7–7.7)
NEUTROS PCT: 66 %
PLATELETS: 239 10*3/uL (ref 150–400)
RBC: 3.16 MIL/uL — AB (ref 4.22–5.81)
RDW: 11.6 % (ref 11.5–15.5)
WBC: 4.1 10*3/uL (ref 4.0–10.5)
nRBC: 0 % (ref 0.0–0.2)

## 2018-02-10 LAB — COMPREHENSIVE METABOLIC PANEL
ALK PHOS: 52 U/L (ref 38–126)
ALT: 21 U/L (ref 0–44)
AST: 24 U/L (ref 15–41)
Albumin: 3.1 g/dL — ABNORMAL LOW (ref 3.5–5.0)
Anion gap: 6 (ref 5–15)
BILIRUBIN TOTAL: 0.5 mg/dL (ref 0.3–1.2)
BUN: 63 mg/dL — ABNORMAL HIGH (ref 8–23)
CO2: 27 mmol/L (ref 22–32)
Calcium: 8.7 mg/dL — ABNORMAL LOW (ref 8.9–10.3)
Chloride: 107 mmol/L (ref 98–111)
Creatinine, Ser: 3.72 mg/dL — ABNORMAL HIGH (ref 0.61–1.24)
GFR calc Af Amer: 18 mL/min — ABNORMAL LOW (ref >60)
GFR calc non Af Amer: 16 mL/min — ABNORMAL LOW (ref >60)
Glucose, Bld: 123 mg/dL — ABNORMAL HIGH (ref 70–99)
Potassium: 4 mmol/L (ref 3.5–5.1)
Sodium: 140 mmol/L (ref 135–145)
Total Protein: 5.9 g/dL — ABNORMAL LOW (ref 6.5–8.1)

## 2018-02-10 LAB — FERRITIN: Ferritin: 221 ng/mL (ref 24–336)

## 2018-02-10 LAB — IRON AND TIBC
Iron: 63 ug/dL (ref 45–182)
Saturation Ratios: 23 % (ref 17.9–39.5)
TIBC: 280 ug/dL (ref 250–450)
UIBC: 217 ug/dL

## 2018-02-11 LAB — PROTEIN ELECTROPHORESIS, SERUM
A/G RATIO SPE: 1.2 (ref 0.7–1.7)
ALBUMIN ELP: 2.9 g/dL (ref 2.9–4.4)
ALPHA-1-GLOBULIN: 0.2 g/dL (ref 0.0–0.4)
ALPHA-2-GLOBULIN: 0.9 g/dL (ref 0.4–1.0)
BETA GLOBULIN: 0.8 g/dL (ref 0.7–1.3)
Gamma Globulin: 0.5 g/dL (ref 0.4–1.8)
Globulin, Total: 2.4 g/dL (ref 2.2–3.9)
M-Spike, %: 0.3 g/dL — ABNORMAL HIGH
Total Protein ELP: 5.3 g/dL — ABNORMAL LOW (ref 6.0–8.5)

## 2018-02-11 LAB — KAPPA/LAMBDA LIGHT CHAINS
KAPPA, LAMDA LIGHT CHAIN RATIO: 1.67 — AB (ref 0.26–1.65)
Kappa free light chain: 119 mg/L — ABNORMAL HIGH (ref 3.3–19.4)
Lambda free light chains: 71.2 mg/L — ABNORMAL HIGH (ref 5.7–26.3)

## 2018-02-12 LAB — LACTATE DEHYDROGENASE, ISOENZYMES
LDH 1: 20 % (ref 17–32)
LDH 2: 33 % (ref 25–40)
LDH 3: 25 % (ref 17–27)
LDH 4: 10 % (ref 5–13)
LDH 5: 12 % (ref 4–20)
LDH Isoenzymes, Total: 252 IU/L — ABNORMAL HIGH (ref 121–224)

## 2018-02-13 LAB — IMMUNOFIXATION ELECTROPHORESIS
IGG (IMMUNOGLOBIN G), SERUM: 709 mg/dL (ref 700–1600)
IgA: 146 mg/dL (ref 61–437)
IgM (Immunoglobulin M), Srm: 31 mg/dL (ref 20–172)
TOTAL PROTEIN ELP: 5.5 g/dL — AB (ref 6.0–8.5)

## 2018-02-17 ENCOUNTER — Inpatient Hospital Stay (HOSPITAL_COMMUNITY): Payer: Medicare Other | Attending: Hematology | Admitting: Hematology

## 2018-02-17 ENCOUNTER — Other Ambulatory Visit: Payer: Self-pay

## 2018-02-17 ENCOUNTER — Encounter (HOSPITAL_COMMUNITY): Payer: Self-pay | Admitting: Hematology

## 2018-02-17 VITALS — BP 147/67 | HR 60 | Temp 98.1°F | Resp 16 | Wt 239.0 lb

## 2018-02-17 DIAGNOSIS — D631 Anemia in chronic kidney disease: Secondary | ICD-10-CM | POA: Diagnosis not present

## 2018-02-17 DIAGNOSIS — D509 Iron deficiency anemia, unspecified: Secondary | ICD-10-CM | POA: Diagnosis not present

## 2018-02-17 DIAGNOSIS — N183 Chronic kidney disease, stage 3 (moderate): Secondary | ICD-10-CM | POA: Insufficient documentation

## 2018-02-17 DIAGNOSIS — D472 Monoclonal gammopathy: Secondary | ICD-10-CM | POA: Insufficient documentation

## 2018-02-17 NOTE — Assessment & Plan Note (Signed)
1.  Normocytic anemia: -This is from combination of CKD and relative iron deficiency. -Last Injectafer on 09/26/2017 and 10/03/2017. -Denies any bleeding per rectum or melena. - He reports that he had good energy levels until Thanksgiving.  He is feeling tired at this time.  We went over his blood work.  Ferritin improved to about 200.  However percent saturation is 23.  I have recommended one more infusion of parenteral iron.  We will switch from [facility] to [place].  We talked about side effects in detail. -We will check him back in 3 months for follow-up.  His hemoglobin is barely at 10.  If it is not staying about 10 despite the next iron infusion, I will consider adding Procrit/Epogen.  2.  IgG lambda MGUS:  -Skeletal survey on 09/20/2017 did not show any lytic lesions.  Degenerative changes in the cervical, thoracic and lumbar spine. - We discussed the lab results.  SPEP from 02/10/2018 shows M spike of 0.3 g/dL.  Free light chain ratio was 1.67. -However his creatinine went up to 3.67.  He is taking Lasix twice a day.  He follows up with Dr. Posey Pronto who is his nephrologist. -We will plan to repeat MGUS labs in 6 months.  He will have skeletal survey once a year.

## 2018-02-17 NOTE — Progress Notes (Signed)
Henry Carroll, [place] [address]   CLINIC:  Medical Oncology/Hematology  PCP:  [name], NP [place] [id number] [phone]   REASON FOR VISIT: Follow-up for iron deficiency anemia and MGUS  CURRENT THERAPY: intermittent parenteral iron infusions.   INTERVAL HISTORY:  Henry Carroll 68 y.o. male returns for routine follow-up iron deficiency anemia and MGUS. He reports the iron infusion worked well but didn't last very long. Around thanksgiving his energy level started to decrease again. Denies any nausea, vomiting, or diarrhea. Denies any new pains. Had not noticed any recent bleeding such as epistaxis, hematuria or hematochezia. Denies recent chest pain on exertion, shortness of breath on minimal exertion, pre-syncopal episodes, or palpitations. Denies any numbness or tingling in hands or feet. Denies any recent fevers, infections, or recent hospitalizations. He reports his appetite at 100% and energy level is 25%.    REVIEW OF SYSTEMS:  Review of Systems  All other systems reviewed and are negative.    PAST MEDICAL/SURGICAL HISTORY:  Past Medical History:  Diagnosis Date  . Anemia   . 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 Asthma   . Chronic combined systolic (congestive) and diastolic (congestive) heart failure ([place])    a. 12/31/14: 2D ECHO: EF 40-45%, HK of inf myocardium, G1DD, mod MR  . CKD (chronic kidney disease), stage III (HCC)    stage 3 kidney disease  . Coronary artery disease    a. LHC 01/2015 - triple vessel CAD (mod oLM, mLAD, severe mRCA, intermediate branch stenosis, CTO of mCx). Plan CABG 02/2015.  Marland Kitchen GERD (gastroesophageal reflux disease)   . Hyperlipidemia   . Hypertension   . Mitral regurgitation    a. Mild-mod by echo 12/2014.  Marland Kitchen Myocardial infarction [facility])    pt. states per Dr. Cyndia Henry he has in the past  . NSVT (nonsustained ventricular tachycardia) ([place])    a. 9 beats during 01/2015 adm.  BB titrated.  . Obesity    a. BMI 33  . OSA (obstructive sleep apnea) 01/16/2018   Mild obstructive sleep apnea overall with an AHI of 7.3/h and no significant central sleep apnea. Severe obstructive sleep apnea during REM sleep with an AHI of 34.3/h.  Now on CPAP at 6 cm H2O.   . Sleep apnea    2019, Dr.Turner diagnoised   . Type II diabetes mellitus ([place])    Past Surgical History:  Procedure Laterality Date  . BACK SURGERY    . CARDIAC CATHETERIZATION N/A 02/09/2015   Procedure: Left Heart Cath and Coronary Angiography;  Surgeon: Burnell Blanks, MD;  Location: [facility] LAB;  Service: Cardiovascular;  Laterality: N/A;  . COLONOSCOPY  2011   Dr. [name]: Ileocecal valve appeared normal, scattered pancolonic diverticulosis, difficult bowel prep making smaller lesions potentially missed. Recommended three-year follow-up colonoscopy.  . COLONOSCOPY  2003   Dr. [name]: Suspicious lesion at the ileocecal valve, multiple biopsies benign, pancolonic diverticulosis.  . COLONOSCOPY N/A 02/15/2016   diverticulosis in sigmoid and descending colon, single 5 mm polyp at splenic flexure (Tubular adenoma)  . CORONARY ARTERY BYPASS GRAFT N/A 02/18/2015   Procedure: CORONARY ARTERY BYPASS GRAFTING (CABG) x  four, using bilateral internal mammary arteries and right leg greater saphenous vein harvested endoscopically;  Surgeon: [name], MD;  Location: [facility] OR;  Service: Open Heart Surgery;  Laterality: N/A;  . ESOPHAGOGASTRODUODENOSCOPY N/A 02/15/2016   normal  . EYE SURGERY Right  July 2019   . GIVENS CAPSULE STUDY N/A 01/08/2017   couple of gastric and small bowel eroions in setting of aspirin 81 mg daily but nothing concerning, continue Hematology follow-up  . HERNIA REPAIR  0272   Umbilical  . LUMBAR [place] SURGERY  2006   L4 & L5  . REFRACTIVE SURGERY Left    2019   . REFRACTIVE SURGERY Right    2019  . TEE WITHOUT CARDIOVERSION N/A 02/18/2015   Procedure: TRANSESOPHAGEAL ECHOCARDIOGRAM  (TEE);  Surgeon: [name], MD;  Location: [facility];  Service: Open Heart Surgery;  Laterality: N/A;  . TONSILLECTOMY  1962     SOCIAL HISTORY:  Social History   Socioeconomic History  . Marital status: Married    Spouse name: Not on file  . Number of children: Not on file  . Years of education: Not on file  . Highest education level: Not on file  Occupational History  . Not on file  Social Needs  . Financial resource strain: Not hard at all  . Food insecurity:    Worry: Never true    Inability: Never true  . Transportation needs:    Medical: No    Non-medical: No  Tobacco Use  . Smoking status: Former Smoker    Years: 17.00    Types: Cigars    Last attempt to quit: 12/31/2014    Years since quitting: 3.1  . Smokeless tobacco: Never Used  Substance and Sexual Activity  . Alcohol use: Yes    Alcohol/week: 0.0 standard drinks    Comment: occasional glass of wine.  . Drug use: No  . Sexual activity: Yes    Birth control/protection: None  Lifestyle  . Physical activity:    Days per week: 5 days    Minutes per session: 30 min  . Stress: Only a little  Relationships  . Social connections:    Talks on phone: More than three times a week    Gets together: More than three times a week    Attends religious service: More than 4 times per year    Active member of club or organization: Yes    Attends meetings of clubs or organizations: 1 to 4 times per year    Relationship status: Married  . Intimate partner violence:    Fear of current or ex partner: No    Emotionally abused: No    Physically abused: No    Forced sexual activity: No  Other Topics Concern  . Not on file  Social History Narrative  . Not on file    FAMILY HISTORY:  Family History  Problem Relation Age of Onset  . Diabetes Mother   . Heart disease Mother        later in life, age >42  . Heart disease Father        heart failure later in life  . Hypertension Father   . Stroke Sister   . Kidney  disease Daughter   . Sudden death Daughter   . Colon cancer Paternal Uncle        Passed from colon CA  . Heart attack Neg Hx     CURRENT MEDICATIONS:  Outpatient Encounter Medications as of 02/17/2018  Medication Sig  . acetaminophen (TYLENOL) 500 MG tablet Take 1,000 mg by mouth 2 (two) times daily as needed for moderate pain.  Marland Kitchen albuterol (PROVENTIL) (2.5 MG/3ML) 0.083% nebulizer solution USE ONE VIAL IN NEBULIZER EVERY 6 HOURS AS NEEDED FOR WHEEZING FOR  SHORTNESS OF BREATH  . amLODipine (NORVASC) 10 MG tablet TAKE 1 TABLET BY MOUTH ONCE DAILY  . aspirin EC 81 MG tablet Take 1 tablet (81 mg total) by mouth daily.  Marland Kitchen atorvastatin (LIPITOR) 40 MG tablet TAKE 1 TABLET BY MOUTH EVERY EVENING  . carvedilol (COREG) 25 MG tablet Take 25 mg by mouth 2 (two) times daily with a meal.  . Cholecalciferol ([employer] VITAMIN D3) 1000 UNITS capsule Take 2 capsules (2,000 Units total) by mouth daily.  Marland Kitchen CINNAMON PO Take 1,000 mg by mouth 2 (two) times daily.   . Coenzyme Q10 (CO Q 10 PO) Take 200 mg by mouth daily.   Marland Kitchen ENTRESTO 24-26 MG TAKE 1 TABLET BY MOUTH TWICE DAILY  . furosemide (LASIX) 40 MG tablet TAKE 1 & 1/2 (ONE & ONE-HALF) TABLETS BY MOUTH TWICE DAILY  . glucose blood (ONE TOUCH ULTRA TEST) test strip Use to test blood sugar three times daily. Dx: E11.21  . insulin detemir (LEVEMIR) 100 UNIT/ML injection Inject 60 Units into the skin daily. E11.65  . montelukast (SINGULAIR) 10 MG tablet TAKE 1 TABLET BY MOUTH ONCE DAILY AT BEDTIME  . Multiple Vitamins-Minerals (ONE-A-DAY MENS 50+ ADVANTAGE) TABS Take 1 tablet by mouth daily.  Marland Kitchen PROAIR HFA 108 (90 Base) MCG/ACT inhaler INHALE 2 PUFFS INTO LUNGS EVERY 6 HOURS AS NEEDED FOR SHORTNESS OF BREATH  . fluticasone (FLONASE) 50 MCG/ACT nasal spray Place 2 sprays into both nostrils daily as needed for allergies or rhinitis (SEASONAL ALLERGIES).  . Fluticasone-Salmeterol (ADVAIR DISKUS) 100-50 MCG/DOSE AEPB Inhale 1 puff into the lungs 2 (two) times daily.  (Patient not taking: Reported on 02/17/2018)  . hydrALAZINE (APRESOLINE) 50 MG tablet Take 1 tablet (50 mg total) by mouth 2 (two) times daily.  . nitroGLYCERIN (NITROSTAT) 0.4 MG SL tablet Place 1 tablet (0.4 mg total) under the tongue every 5 (five) minutes as needed for chest pain. (Patient not taking: Reported on 02/17/2018)  . [DISCONTINUED] terbinafine (LAMISIL) 1 % cream Apply 1 application topically daily as needed (foot).    Facility-Administered Encounter Medications as of 02/17/2018  Medication  . regadenoson (LEXISCAN) injection SOLN 0.4 mg    ALLERGIES:  No Known Allergies   PHYSICAL EXAM:  ECOG Performance status: 1  Vitals:   02/17/18 0800  BP: (!) 147/67  Pulse: 60  Resp: 16  Temp: 98.1 F (36.7 C)  SpO2: 100%   Filed Weights   02/17/18 0800  Weight: 239 lb (108.4 kg)    Physical Exam Constitutional:      Appearance: Normal appearance. He is normal weight.  Cardiovascular:     Rate and Rhythm: Normal rate and regular rhythm.     Heart sounds: Normal heart sounds.  Pulmonary:     Effort: Pulmonary effort is normal.     Breath sounds: Normal breath sounds.  Musculoskeletal: Normal range of motion.  Skin:    General: Skin is warm and dry.  Neurological:     Mental Status: He is alert and oriented to person, place, and time. Mental status is at baseline.  Psychiatric:        Mood and Affect: Mood normal.        Behavior: Behavior normal.        Thought Content: Thought content normal.        Judgment: Judgment normal.      LABORATORY DATA:  I have reviewed the labs as listed.  CBC    Component Value Date/Time   WBC 4.1 02/10/2018 1311  RBC 3.16 (L) 02/10/2018 1311   HGB 10.0 (L) 02/10/2018 1311   HGB 9.8 (L) 12/25/2016 1527   HCT 30.5 (L) 02/10/2018 1311   HCT 30.1 (L) 12/25/2016 1527   PLT 239 02/10/2018 1311   PLT 283 12/25/2016 1527   MCV 96.5 02/10/2018 1311   MCV 96 12/25/2016 1527   MCH 31.6 02/10/2018 1311   MCHC 32.8 02/10/2018 1311    RDW 11.6 02/10/2018 1311   RDW 13.8 12/25/2016 1527   LYMPHSABS 0.9 02/10/2018 1311   LYMPHSABS 1.1 11/15/2016 0815   MONOABS 0.3 02/10/2018 1311   EOSABS 0.1 02/10/2018 1311   EOSABS 0.2 11/15/2016 0815   BASOSABS 0.0 02/10/2018 1311   BASOSABS 0.0 11/15/2016 0815   CMP Latest Ref Rng & Units 02/10/2018 10/09/2017 09/16/2017  Glucose 70 - 99 mg/dL 123(H) 164(H) 130(H)  BUN 8 - 23 mg/dL 63(H) 37(H) 58(H)  Creatinine 0.61 - 1.24 mg/dL 3.72(H) 2.96(H) 3.64(H)  Sodium 135 - 145 mmol/L 140 143 139  Potassium 3.5 - 5.1 mmol/L 4.0 4.3 4.2  Chloride 98 - 111 mmol/L 107 106 104  CO2 22 - 32 mmol/L 27 23 29   Calcium 8.9 - 10.3 mg/dL 8.7(L) 8.4(L) 9.1  Total Protein 6.5 - 8.1 g/dL 5.9(L) - 6.9  Total Bilirubin 0.3 - 1.2 mg/dL 0.5 - 0.5  Alkaline Phos 38 - 126 U/L 52 - 75  AST 15 - 41 U/L 24 - 32  ALT 0 - 44 U/L 21 - 25       DIAGNOSTIC IMAGING:  I have independently reviewed the scans and discussed with the patient.   I have reviewed Francene Finders, NP's note and agree with the documentation.  I personally performed a face-to-face visit, made revisions and my assessment and plan is as follows.    ASSESSMENT & PLAN:   Iron deficiency anemia 1.  Normocytic anemia: -This is from combination of CKD and relative iron deficiency. -Last Injectafer on 09/26/2017 and 10/03/2017. -Denies any bleeding per rectum or melena. - He reports that he had good energy levels until Thanksgiving.  He is feeling tired at this time.  We went over his blood work.  Ferritin improved to about 200.  However percent saturation is 23.  I have recommended one more infusion of parenteral iron.  We will switch from [facility] to [place].  We talked about side effects in detail. -We will check him back in 3 months for follow-up.  His hemoglobin is barely at 10.  If it is not staying about 10 despite the next iron infusion, I will consider adding Procrit/Epogen.  2.  IgG lambda MGUS:  -Skeletal survey on 09/20/2017  did not show any lytic lesions.  Degenerative changes in the cervical, thoracic and lumbar spine. - We discussed the lab results.  SPEP from 02/10/2018 shows M spike of 0.3 g/dL.  Free light chain ratio was 1.67. -However his creatinine went up to 3.67.  He is taking Lasix twice a day.  He follows up with Dr. Posey Pronto who is his nephrologist. -We will plan to repeat MGUS labs in 6 months.  He will have skeletal survey once a year.      Orders placed this encounter:  Orders Placed This Encounter  Procedures  . CBC with Differential/Platelet  . Comprehensive metabolic panel  . Ferritin  . Iron and TIBC  . Vitamin B12  . Folate      Derek Jack, MD [place] [phone]

## 2018-02-17 NOTE — Patient Instructions (Addendum)
[facility] at [facility] Discharge Instructions   Follow up in 3 months with labs prior to you visit.  WE will set you up for 1 Feraheme iron infusion.   Thank you for choosing [facility] at [facility] to provide your oncology and hematology care.  To afford each patient quality time with our provider, please arrive at least 15 minutes before your scheduled appointment time.   If you have a lab appointment with the [facility] please come in thru the  Main Entrance and check in at the main information desk  You need to re-schedule your appointment should you arrive 10 or more minutes late.  We strive to give you quality time with our providers, and arriving late affects you and other patients whose appointments are after yours.  Also, if you no show three or more times for appointments you may be dismissed from the clinic at the providers discretion.     Again, thank you for choosing [facility].  Our hope is that these requests will decrease the amount of time that you wait before being seen by our physicians.       _____________________________________________________________  Should you have questions after your visit to [facility], please contact our office at [phone] between the hours of 8:00 a.m. and 4:30 p.m.  Voicemails left after 4:00 p.m. will not be returned until the following business day.  For prescription refill requests, have your pharmacy contact our office and allow 72 hours.    Cancer Center Support Programs:   > Cancer Support Group  2nd Tuesday of the month 1pm-2pm, Journey Room

## 2018-02-18 ENCOUNTER — Inpatient Hospital Stay (HOSPITAL_COMMUNITY): Payer: Medicare Other

## 2018-02-18 ENCOUNTER — Encounter (HOSPITAL_COMMUNITY): Payer: Self-pay

## 2018-02-18 ENCOUNTER — Other Ambulatory Visit: Payer: Self-pay

## 2018-02-18 ENCOUNTER — Other Ambulatory Visit (HOSPITAL_COMMUNITY): Payer: Self-pay | Admitting: Nurse Practitioner

## 2018-02-18 VITALS — BP 154/64 | HR 62 | Temp 98.0°F | Resp 18

## 2018-02-18 DIAGNOSIS — D509 Iron deficiency anemia, unspecified: Secondary | ICD-10-CM

## 2018-02-18 DIAGNOSIS — D472 Monoclonal gammopathy: Secondary | ICD-10-CM | POA: Diagnosis not present

## 2018-02-18 DIAGNOSIS — D631 Anemia in chronic kidney disease: Secondary | ICD-10-CM | POA: Diagnosis not present

## 2018-02-18 DIAGNOSIS — N183 Chronic kidney disease, stage 3 (moderate): Secondary | ICD-10-CM | POA: Diagnosis not present

## 2018-02-18 MED ORDER — SODIUM CHLORIDE 0.9 % IV SOLN
INTRAVENOUS | Status: DC
  Administered 2018-02-18: 14:00:00 via INTRAVENOUS

## 2018-02-18 MED ORDER — SODIUM CHLORIDE 0.9 % IV SOLN
510.0000 mg | Freq: Once | INTRAVENOUS | Status: AC
  Administered 2018-02-18: 510 mg via INTRAVENOUS
  Filled 2018-02-18: qty 17

## 2018-02-18 NOTE — Patient Instructions (Signed)
[facility] at [facility] Discharge Instructions  feraheme given today Follow up as scheduled.   Thank you for choosing [facility] at [facility] to provide your oncology and hematology care.  To afford each patient quality time with our provider, please arrive at least 15 minutes before your scheduled appointment time.   If you have a lab appointment with the Cancer Center please come in thru the  Main Entrance and check in at the main information desk  You need to re-schedule your appointment should you arrive 10 or more minutes late.  We strive to give you quality time with our providers, and arriving late affects you and other patients whose appointments are after yours.  Also, if you no show three or more times for appointments you may be dismissed from the clinic at the providers discretion.     Again, thank you for choosing [facility].  Our hope is that these requests will decrease the amount of time that you wait before being seen by our physicians.       _____________________________________________________________  Should you have questions after your visit to [facility], please contact our office at [phone] between the hours of 8:00 a.m. and 4:30 p.m.  Voicemails left after 4:00 p.m. will not be returned until the following business day.  For prescription refill requests, have your pharmacy contact our office and allow 72 hours.    Cancer Center Support Programs:   > Cancer Support Group  2nd Tuesday of the month 1pm-2pm, Journey Room   

## 2018-02-18 NOTE — Progress Notes (Signed)
Feraheme given per orders. Patient tolerated it well without problems. Vitals stable and discharged home from clinic ambulatory. Follow up as scheduled.  

## 2018-02-27 ENCOUNTER — Encounter: Payer: Self-pay | Admitting: Nurse Practitioner

## 2018-02-27 ENCOUNTER — Ambulatory Visit (INDEPENDENT_AMBULATORY_CARE_PROVIDER_SITE_OTHER): Payer: Medicare Other | Admitting: Nurse Practitioner

## 2018-02-27 VITALS — BP 140/80 | HR 57 | Temp 97.7°F | Resp 10 | Ht 73.0 in | Wt 240.0 lb

## 2018-02-27 DIAGNOSIS — I5022 Chronic systolic (congestive) heart failure: Secondary | ICD-10-CM

## 2018-02-27 DIAGNOSIS — E1121 Type 2 diabetes mellitus with diabetic nephropathy: Secondary | ICD-10-CM | POA: Diagnosis not present

## 2018-02-27 DIAGNOSIS — E785 Hyperlipidemia, unspecified: Secondary | ICD-10-CM | POA: Diagnosis not present

## 2018-02-27 DIAGNOSIS — J4520 Mild intermittent asthma, uncomplicated: Secondary | ICD-10-CM | POA: Diagnosis not present

## 2018-02-27 DIAGNOSIS — E1159 Type 2 diabetes mellitus with other circulatory complications: Secondary | ICD-10-CM | POA: Diagnosis not present

## 2018-02-27 DIAGNOSIS — E1165 Type 2 diabetes mellitus with hyperglycemia: Secondary | ICD-10-CM

## 2018-02-27 DIAGNOSIS — N183 Chronic kidney disease, stage 3 unspecified: Secondary | ICD-10-CM

## 2018-02-27 DIAGNOSIS — G4730 Sleep apnea, unspecified: Secondary | ICD-10-CM

## 2018-02-27 DIAGNOSIS — I10 Essential (primary) hypertension: Secondary | ICD-10-CM

## 2018-02-27 DIAGNOSIS — E1169 Type 2 diabetes mellitus with other specified complication: Secondary | ICD-10-CM

## 2018-02-27 DIAGNOSIS — IMO0002 Reserved for concepts with insufficient information to code with codable children: Secondary | ICD-10-CM

## 2018-02-27 MED ORDER — INSULIN DETEMIR 100 UNIT/ML ~~LOC~~ SOLN: 60.0000 [IU] | Freq: Two times a day (BID) | SUBCUTANEOUS | 6 refills | Status: DC

## 2018-02-27 NOTE — Progress Notes (Signed)
Careteam: Patient Care Team: [name], NP as PCP - General (Geriatric Medicine) Jerline Pain, MD as PCP - Cardiology (Cardiology) Jerline Pain, MD as Consulting Physician (Cardiology) Fleet Contras, MD as Consulting Physician (Nephrology) Rutherford Guys, MD as Consulting Physician (Ophthalmology) [name], MD as Consulting Physician (Gastroenterology)  Advanced Directive information    No Known Allergies  Chief Complaint  Patient presents with  . Medical Management of Chronic Issues    3 month follow-up and A1c recheck  . Health Maintenance    Discuss need for Hep C screening and MALB due     HPI: Patient is a 68 y.o. male seen in the office today for routine follow up.   multivessel CAD - stable. He underwent CABG x 4 vessels by Dr Charlott Rakes on 02/18/15. Vein harvested from right leg. ABIs 0.62 on right and 0.28 on left.  Followed by cardio Dr Marlou Porch. Released from CT surgery. Completed cardiac rehab in July 2017. Currently no new CP/SOB. He is still golfing.  Chronic systolic CHF - weight is stable, no swelling or shortness of breath.  He started [facility] in late January 2019. Followed by cardio Dr Marlou Porch. 2D echo in Oct 2018. Using lasix BID as well.   DM - BS fluctuating at home but usually <160.  1-2 low BS reactions ~60 in the last month, was feeling shaking but ate something and it was corrected. No numbness or tingling. . Uses 60 units levemir daily. A1c 8.0% (prev 10.1%)  HTN - stable on lasix, coreg, hydralazine, amlodipine. Takes ASA daily. Followed by cardio Dr Marlou Porch  Hyperlipidemia - stable on lipitor. No myalgias. LDL 130  Asthma -using advair and albuterol neb. Takes singulair. Feels like asthma is well controlled. Does not need rescue medication often.   CKD - stage 4. followed by nephrology Dr Posey Pronto . 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 In the past, he has recently been transfused with iron infusions. Has needed  PRBC in the past. 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  HENT: Negative for tinnitus.   Respiratory: Negative for cough, sputum production and shortness of breath.   Cardiovascular: Negative for chest pain, palpitations and leg swelling.  Gastrointestinal: Negative for abdominal pain, constipation, diarrhea and heartburn.  Genitourinary: Negative for dysuria, frequency and urgency.  Musculoskeletal: Positive for myalgias (occasional). Negative for back pain, falls and joint pain.  Skin: Negative.   Neurological: Negative for dizziness and headaches.  Psychiatric/Behavioral: Negative for depression and memory loss. The patient does not have insomnia.     Past Medical History:  Diagnosis Date  . Anemia   . 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 Asthma   . Chronic combined systolic (congestive) and diastolic (congestive) heart failure ([place])    a. 12/31/14: 2D ECHO: EF 40-45%, HK of inf myocardium, G1DD, mod MR  . CKD (chronic kidney disease), stage III (HCC)    stage 3 kidney disease  . Coronary artery disease    a. LHC 01/2015 - triple vessel CAD (mod oLM, mLAD, severe mRCA, intermediate branch stenosis, CTO of mCx). Plan CABG 02/2015.  Marland Kitchen GERD (gastroesophageal reflux disease)   . Hyperlipidemia   . Hypertension   . Mitral regurgitation    a. Mild-mod by  echo 12/2014.  Marland Kitchen Myocardial infarction [facility])    pt. states per [name] he has in the past  . NSVT (nonsustained ventricular tachycardia) ([place])    a. 9 beats during 01/2015 adm. BB titrated.  . Obesity    a. BMI 33  . OSA (obstructive sleep apnea) 01/16/2018   Mild obstructive sleep apnea overall with an AHI of 7.3/h and no significant central sleep apnea. Severe obstructive sleep apnea during REM sleep with an AHI of 34.3/h.  Now on CPAP at  6 cm H2O.   . Sleep apnea    2019, Dr.Turner diagnoised   . Type II diabetes mellitus ([place])    Past Surgical History:  Procedure Laterality Date  . BACK SURGERY    . CARDIAC CATHETERIZATION N/A 02/09/2015   Procedure: Left Heart Cath and Coronary Angiography;  Surgeon: Burnell Blanks, MD;  Location: [place] [facility];  Service: Cardiovascular;  Laterality: N/A;  . COLONOSCOPY  2011   Dr. [name]: Ileocecal valve appeared normal, scattered pancolonic diverticulosis, difficult bowel prep making smaller lesions potentially missed. Recommended three-year follow-up colonoscopy.  . COLONOSCOPY  2003   Dr. [name]: Suspicious lesion at the ileocecal valve, multiple biopsies benign, pancolonic diverticulosis.  . COLONOSCOPY N/A 02/15/2016   diverticulosis in sigmoid and descending colon, single 5 mm polyp at splenic flexure (Tubular adenoma)  . CORONARY ARTERY BYPASS GRAFT N/A 02/18/2015   Procedure: CORONARY ARTERY BYPASS GRAFTING (CABG) x  four, using bilateral internal mammary arteries and right leg greater saphenous vein harvested endoscopically;  Surgeon: [name], MD;  Location: [place] OR;  Service: Open Heart Surgery;  Laterality: N/A;  . ESOPHAGOGASTRODUODENOSCOPY N/A 02/15/2016   normal  . EYE SURGERY Right    July 2019   . GIVENS CAPSULE STUDY N/A 01/08/2017   couple of gastric and small bowel eroions in setting of aspirin 81 mg daily but nothing concerning, continue Hematology follow-up  . HERNIA REPAIR  9509   Umbilical  . LUMBAR [place] SURGERY  2006   L4 & L5  . REFRACTIVE SURGERY Left    2019   . REFRACTIVE SURGERY Right    2019  . TEE WITHOUT CARDIOVERSION N/A 02/18/2015   Procedure: TRANSESOPHAGEAL ECHOCARDIOGRAM (TEE);  Surgeon: [name], MD;  Location: [place];  Service: Open Heart Surgery;  Laterality: N/A;  . TONSILLECTOMY  1962   Social History:   reports that he quit smoking about 3 years ago. His smoking use included cigars. He quit after 17.00 years of use. He  has never used smokeless tobacco. He reports current alcohol use. He reports that he does not use drugs.  Family History  Problem Relation Age of Onset  . Diabetes Mother   . Heart disease Mother        later in life, age >70  . Heart disease Father        heart failure later in life  . Hypertension Father   . Stroke Sister   . Kidney disease Daughter   . Sudden death Daughter   . Colon cancer Paternal Uncle        Passed from colon CA  . Heart attack Neg Hx     Medications: Patient's Medications  New Prescriptions   No medications on file  Previous Medications   ACETAMINOPHEN (TYLENOL) 500 MG TABLET    Take 1,000 mg by mouth 2 (two) times daily as needed for moderate pain.   ALBUTEROL (PROVENTIL) (2.5 MG/3ML) 0.083% NEBULIZER SOLUTION  USE ONE VIAL IN NEBULIZER EVERY 6 HOURS AS NEEDED FOR WHEEZING FOR SHORTNESS OF BREATH   AMLODIPINE (NORVASC) 10 MG TABLET    TAKE 1 TABLET BY MOUTH ONCE DAILY   ASPIRIN EC 81 MG TABLET    Take 1 tablet (81 mg total) by mouth daily.   ATORVASTATIN (LIPITOR) 40 MG TABLET    TAKE 1 TABLET BY MOUTH EVERY EVENING   CARVEDILOL (COREG) 25 MG TABLET    Take 25 mg by mouth 2 (two) times daily with a meal.   CHOLECALCIFEROL ([employer] VITAMIN D3) 1000 UNITS CAPSULE    Take 2 capsules (2,000 Units total) by mouth daily.   CINNAMON PO    Take 1,000 mg by mouth 2 (two) times daily.    COENZYME Q10 (CO Q 10 PO)    Take 200 mg by mouth daily.    ENTRESTO 24-26 MG    TAKE 1 TABLET BY MOUTH TWICE DAILY   FLUTICASONE (FLONASE) 50 MCG/ACT NASAL SPRAY    Place 2 sprays into both nostrils daily as needed for allergies or rhinitis (SEASONAL ALLERGIES).   FLUTICASONE-SALMETEROL (ADVAIR DISKUS) 100-50 MCG/DOSE AEPB    Inhale 1 puff into the lungs 2 (two) times daily.   FUROSEMIDE (LASIX) 40 MG TABLET    TAKE 1 & 1/2 (ONE & ONE-HALF) TABLETS BY MOUTH TWICE DAILY   GLUCOSE BLOOD (ONE TOUCH ULTRA TEST) TEST STRIP    Use to test blood sugar three times daily. Dx: E11.21    HYDRALAZINE (APRESOLINE) 50 MG TABLET    Take 1 tablet (50 mg total) by mouth 2 (two) times daily.   INSULIN DETEMIR (LEVEMIR) 100 UNIT/ML INJECTION    Inject 60 Units into the skin daily. E11.65   MONTELUKAST (SINGULAIR) 10 MG TABLET    TAKE 1 TABLET BY MOUTH ONCE DAILY AT BEDTIME   MULTIPLE VITAMINS-MINERALS (ONE-A-DAY MENS 50+ ADVANTAGE) TABS    Take 1 tablet by mouth daily.   NITROGLYCERIN (NITROSTAT) 0.4 MG SL TABLET    Place 1 tablet (0.4 mg total) under the tongue every 5 (five) minutes as needed for chest pain.   PROAIR HFA 108 (90 BASE) MCG/ACT INHALER    INHALE 2 PUFFS INTO LUNGS EVERY 6 HOURS AS NEEDED FOR SHORTNESS OF BREATH  Modified Medications   No medications on file  Discontinued Medications   No medications on file     Physical Exam:  Vitals:   02/27/18 1313  BP: 140/80  Pulse: (!) 57  Resp: 10  Temp: 97.7 F (36.5 C)  TempSrc: Oral  SpO2: 98%  Weight: 240 lb (108.9 kg)  Height: 6\' 1"  (1.854 m)   Body mass index is 31.66 kg/m.  Physical Exam Constitutional:      Appearance: He is well-developed.  HENT:     Head: Normocephalic and atraumatic.  Eyes:     General: No scleral icterus.    Pupils: Pupils are equal, round, and reactive to light.     Comments: OS tearing noted  Neck:     Musculoskeletal: Neck supple.     Thyroid: No thyromegaly.     Vascular: No carotid bruit.  Cardiovascular:     Rate and Rhythm: Normal rate and regular rhythm.     Heart sounds: Murmur (1/6 SEM) present. No friction rub. No gallop.      Comments: Trace LE edema b/l; no calf TTP Pulmonary:     Effort: Pulmonary effort is normal.     Breath sounds: Normal breath sounds. No wheezing  or rales.  Chest:     Chest wall: No tenderness.  Abdominal:     General: Bowel sounds are normal. There is no distension or abdominal bruit.     Palpations: Abdomen is soft. There is no hepatomegaly, mass or pulsatile mass.     Tenderness: There is no abdominal tenderness. There is no  guarding or rebound.     Comments: obese  Lymphadenopathy:     Cervical: No cervical adenopathy.  Skin:    General: Skin is warm and dry.     Findings: No rash.  Neurological:     Mental Status: He is alert and oriented to person, place, and time.     Deep Tendon Reflexes: Reflexes are normal and symmetric.  Psychiatric:        Behavior: Behavior normal.        Thought Content: Thought content normal.        Judgment: Judgment normal.     Labs reviewed: Basic Metabolic Panel: Recent Labs    09/16/17 1512 10/09/17 1120 02/10/18 1311  NA 139 143 140  K 4.2 4.3 4.0  CL 104 106 107  CO2 29 23 27   GLUCOSE 130* 164* 123*  BUN 58* 37* 63*  CREATININE 3.64* 2.96* 3.72*  CALCIUM 9.1 8.4* 8.7*   Liver Function Tests: Recent Labs    05/13/17 1326 09/16/17 1512 02/10/18 1311  AST 21 32 24  ALT 19 25 21   ALKPHOS 68 75 52  BILITOT 0.5 0.5 0.5  PROT 6.7 6.9 5.9*  ALBUMIN 3.4* 3.5 3.1*   No results for input(s): LIPASE, AMYLASE in the last 8760 hours. No results for input(s): AMMONIA in the last 8760 hours. CBC: Recent Labs    05/13/17 1326 09/16/17 1512 02/10/18 1311  WBC 4.8 4.0 4.1  NEUTROABS 3.2 2.5 2.7  HGB 10.5* 10.6* 10.0*  HCT 30.9* 31.5* 30.5*  MCV 94.5 92.1 96.5  PLT 288 257 239   Lipid Panel: Recent Labs    05/08/17 0841  CHOL 144  HDL 35*  LDLCALC 85  TRIG 137  CHOLHDL 4.1   TSH: No results for input(s): TSH in the last 8760 hours. A1C: Lab Results  Component Value Date   HGBA1C 8.0 (H) 11/26/2017     Assessment/Plan 1. Sleep apnea, unspecified type Following with Dr Radford Pax for OSA using CPAP  2. Uncontrolled type 2 diabetes mellitus with diabetic nephropathy, without long-term current use of insulin ([place]) -has made major dietary modifications. Continues on levemeir 60 units twice daily, will follow up labs today - Hemoglobin G1W - BASIC METABOLIC PANEL WITH GFR  3. Hyperlipidemia associated with type 2 diabetes mellitus  ([place]) -continues on lipitor 40 mg daily with dietary modifications.  - Lipid Panel  4. Chronic systolic heart failure (HCC) Stable, has followed with cardiology. euvolemic at this time. Continues on entresto twice daily with coreg and lasix   5. Asthma, chronic, mild intermittent, uncomplicated Stable on current regimen, no recent flare, continues on advair twice daily  and singulair daily. Will need albuterol at most twice weekly but will not need most of the time.   6. CKD (chronic kidney disease) stage 3, GFR 30-59 ml/min (HCC) Will follow up BMP, based on last lab work there was worsening Cr, may need to follow up with nephrologist sooner.  - BASIC METABOLIC PANEL WITH GFR  7. Hypertension associated with diabetes ([place]) Stable, will continue current regimen.   Next appt: 3 months Stasha Naraine K. Harle Battiest  [facility] [phone]

## 2018-02-28 ENCOUNTER — Telehealth: Payer: Self-pay

## 2018-02-28 ENCOUNTER — Encounter: Payer: Self-pay | Admitting: Nurse Practitioner

## 2018-02-28 DIAGNOSIS — E1169 Type 2 diabetes mellitus with other specified complication: Secondary | ICD-10-CM

## 2018-02-28 DIAGNOSIS — E785 Hyperlipidemia, unspecified: Principal | ICD-10-CM

## 2018-02-28 LAB — BASIC METABOLIC PANEL WITH GFR
BUN/Creatinine Ratio: 16 (calc) (ref 6–22)
BUN: 57 mg/dL — ABNORMAL HIGH (ref 7–25)
CO2: 29 mmol/L (ref 20–32)
CREATININE: 3.47 mg/dL — AB (ref 0.70–1.18)
Calcium: 9.1 mg/dL (ref 8.6–10.3)
Chloride: 105 mmol/L (ref 98–110)
GFR, Est African American: 20 mL/min/{1.73_m2} — ABNORMAL LOW (ref >=60)
GFR, Est Non African American: 17 mL/min/{1.73_m2} — ABNORMAL LOW (ref >=60)
Glucose, Bld: 66 mg/dL (ref 65–99)
Potassium: 4.4 mmol/L (ref 3.5–5.3)
Sodium: 140 mmol/L (ref 135–146)

## 2018-02-28 LAB — LIPID PANEL
Cholesterol: 180 mg/dL (ref <200)
HDL: 42 mg/dL (ref >40)
LDL Cholesterol (Calc): 113 mg/dL (calc) — ABNORMAL HIGH
Non-HDL Cholesterol (Calc): 138 mg/dL (calc) — ABNORMAL HIGH (ref <130)
TRIGLYCERIDES: 132 mg/dL (ref <150)
Total CHOL/HDL Ratio: 4.3 (calc) (ref <5.0)

## 2018-02-28 LAB — HEMOGLOBIN A1C
Hgb A1c MFr Bld: 7.1 % of total Hgb — ABNORMAL HIGH (ref <5.7)
Mean Plasma Glucose: 157 (calc)
eAG (mmol/L): 8.7 (calc)

## 2018-02-28 MED ORDER — ROSUVASTATIN CALCIUM 20 MG PO TABS: 20.0000 mg | ORAL_TABLET | Freq: Every day | ORAL | 1 refills | Status: DC

## 2018-02-28 NOTE — Telephone Encounter (Signed)
Discussed results with patient, patient verbalized understanding of results. Patient in agreement to change Lipitor to Crestor, patient would like a 3 month supply sent to [facility]. RX sent as requested   Future orders placed to recheck labs   Patient will view labs on mychart to obtain copy of report

## 2018-02-28 NOTE — Telephone Encounter (Signed)
-----   Message from [name], NP sent at 02/28/2018  7:26 AM EST ----- LDL bad cholesterol is worse, with all of his heart hx and diabetes needs to be less than 70 for prevention, would recommend changing from lipitor to crestor 20 mg daily and rechecking in 3 months. (please send to pharmacy if he is agreeable)  His a1c is better!! [place] job! Blood sugar was low when he was in the office at 64, his renal function has not worsen since the last lab. He is CKD stage 4 (after looking at nephrologist last notes)

## 2018-03-04 ENCOUNTER — Telehealth: Payer: Self-pay | Admitting: *Deleted

## 2018-03-04 DIAGNOSIS — I5042 Chronic combined systolic (congestive) and diastolic (congestive) heart failure: Secondary | ICD-10-CM

## 2018-03-04 DIAGNOSIS — Z79899 Other long term (current) drug therapy: Secondary | ICD-10-CM

## 2018-03-04 DIAGNOSIS — E113512 Type 2 diabetes mellitus with proliferative diabetic retinopathy with macular edema, left eye: Secondary | ICD-10-CM | POA: Diagnosis not present

## 2018-03-04 MED ORDER — SACUBITRIL-VALSARTAN 49-51 MG PO TABS: 1.0000 | ORAL_TABLET | Freq: Two times a day (BID) | ORAL | 11 refills | Status: DC

## 2018-03-04 NOTE — Telephone Encounter (Signed)
Received BP readings from patient which were reviewed by Dr Candee Furbish. Per Dr Marlou Porch pt is to increase Entresto to 49-51 mg po BID.  Pt is aware and RX is sent into [facility] [place] at his request.  He is to have BMP in 1 week. He will f/u as scheduled 04/01/2018 at 9 am. BP readings per pt  12/13  145/79 12/14  148/68 12/15  158/86 12/16  156/79 12/17  143/77 12/18  135/71 12/19  162/83 12/20  153/94 12/21  159/82 12/22  141/77 12/23  151/88 12/24  153/85 12/25  158/80 12/26  120/63

## 2018-03-05 NOTE — Telephone Encounter (Signed)
Medication list updated.

## 2018-03-12 ENCOUNTER — Other Ambulatory Visit: Payer: Medicare Other | Admitting: *Deleted

## 2018-03-12 DIAGNOSIS — I5042 Chronic combined systolic (congestive) and diastolic (congestive) heart failure: Secondary | ICD-10-CM | POA: Diagnosis not present

## 2018-03-12 DIAGNOSIS — Z79899 Other long term (current) drug therapy: Secondary | ICD-10-CM | POA: Diagnosis not present

## 2018-03-13 LAB — BASIC METABOLIC PANEL
BUN/Creatinine Ratio: 15 (ref 10–24)
BUN: 57 mg/dL — ABNORMAL HIGH (ref 8–27)
CO2: 23 mmol/L (ref 20–29)
Calcium: 9.1 mg/dL (ref 8.6–10.2)
Chloride: 99 mmol/L (ref 96–106)
Creatinine, Ser: 3.79 mg/dL — ABNORMAL HIGH (ref 0.76–1.27)
GFR calc Af Amer: 18 mL/min/{1.73_m2} — ABNORMAL LOW (ref >59)
GFR calc non Af Amer: 15 mL/min/{1.73_m2} — ABNORMAL LOW (ref >59)
Glucose: 45 mg/dL — ABNORMAL LOW (ref 65–99)
Potassium: 4.5 mmol/L (ref 3.5–5.2)
SODIUM: 138 mmol/L (ref 134–144)

## 2018-03-25 DIAGNOSIS — H3561 Retinal hemorrhage, right eye: Secondary | ICD-10-CM | POA: Diagnosis not present

## 2018-03-25 DIAGNOSIS — E113511 Type 2 diabetes mellitus with proliferative diabetic retinopathy with macular edema, right eye: Secondary | ICD-10-CM | POA: Diagnosis not present

## 2018-03-25 LAB — HM DIABETES EYE EXAM

## 2018-03-27 ENCOUNTER — Encounter: Payer: Self-pay | Admitting: Family

## 2018-03-31 DIAGNOSIS — N184 Chronic kidney disease, stage 4 (severe): Secondary | ICD-10-CM | POA: Diagnosis not present

## 2018-03-31 LAB — CBC AND DIFFERENTIAL
HCT: 30 — AB (ref 41–53)
Hemoglobin: 10 — AB (ref 13.5–17.5)

## 2018-04-01 ENCOUNTER — Encounter: Payer: Self-pay | Admitting: Nurse Practitioner

## 2018-04-01 ENCOUNTER — Ambulatory Visit
Admission: RE | Admit: 2018-04-01 | Discharge: 2018-04-01 | Disposition: A | Discharge status: Home / Self Care | Payer: Medicare Other | Source: Ambulatory Visit | Attending: Nurse Practitioner | Admitting: Nurse Practitioner

## 2018-04-01 ENCOUNTER — Other Ambulatory Visit: Payer: Self-pay | Admitting: *Deleted

## 2018-04-01 ENCOUNTER — Ambulatory Visit (INDEPENDENT_AMBULATORY_CARE_PROVIDER_SITE_OTHER): Payer: Medicare Other | Admitting: Nurse Practitioner

## 2018-04-01 VITALS — BP 118/66 | HR 65 | Ht 73.5 in | Wt 226.8 lb

## 2018-04-01 DIAGNOSIS — Z951 Presence of aortocoronary bypass graft: Secondary | ICD-10-CM

## 2018-04-01 DIAGNOSIS — R05 Cough: Secondary | ICD-10-CM

## 2018-04-01 DIAGNOSIS — R059 Cough, unspecified: Secondary | ICD-10-CM

## 2018-04-01 DIAGNOSIS — I5042 Chronic combined systolic (congestive) and diastolic (congestive) heart failure: Secondary | ICD-10-CM

## 2018-04-01 DIAGNOSIS — N184 Chronic kidney disease, stage 4 (severe): Secondary | ICD-10-CM | POA: Diagnosis not present

## 2018-04-01 DIAGNOSIS — I255 Ischemic cardiomyopathy: Secondary | ICD-10-CM | POA: Diagnosis not present

## 2018-04-01 DIAGNOSIS — I10 Essential (primary) hypertension: Secondary | ICD-10-CM | POA: Diagnosis not present

## 2018-04-01 DIAGNOSIS — R0609 Other forms of dyspnea: Secondary | ICD-10-CM | POA: Diagnosis not present

## 2018-04-01 LAB — VITAMIN D 25 HYDROXY (VIT D DEFICIENCY, FRACTURES): Vit D, 25-Hydroxy: 54.6

## 2018-04-01 MED ORDER — AZITHROMYCIN 250 MG PO TABS: ORAL_TABLET | ORAL | 0 refills | Status: DC

## 2018-04-01 NOTE — Progress Notes (Signed)
CARDIOLOGY OFFICE NOTE  Date:  04/01/2018    Henry Carroll Date of Birth: 11/17/1950 Medical Record #[id number]  PCP:  [name], NP  Cardiologist:  [name]    Chief Complaint  Patient presents with  . Congestive Heart Failure    Follow up visit - seen for [name]    History of Present Illness: Henry Carroll is a 68 y.o. male who presents today for a 4 month check. Seen for [name].   He has a history of CAD with prior CABG in 2017 - LIMA to LAD, free right internal mammary graft to ramus, SVG to OM, SVG to PDA. Other issues include diabetes, hypertension, hyperlipidemia, stage III CKD (Dr. 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 Prior nuclear stress test was intermediate risk showing a medium-sized severe basal to mid inferior inferolateral perfusion defect. EF on nuclear stress was 30%. Echo from February of 2018 showed improvement with EF of 45 to 01%KPVV chronic systolic HF. He sees [name] for OSA.   He had issues with anemia in November of 2018 - this required transfusion. He was sent back to GI - then to hematology - placed on iron infusions. Has had issues with volume overload back in September of 2018 -   In September of 2018 - seen as a work in with volume overload - had EKG changes. Remained anemic as well. Progressive CKD. We diuresed him, got a Myoview and updated his echo. Myoview with EF of 40% - prior MI. Echo with EF 40 to 45% - improved slightly from prior study. He was placed on Entresto.   Last seen by me in August of 2019, saw [name] in October of 2019. Cardiac status ok - has had more issues with his vision.   Comes in today. Here with his wife today. He is having issues not sleeping - then starts coughing/congestion - worse with lying down. Says its been "pretty bad". He was thinking that he was going to be put in the  hospital. Says this has been going on for about 3 weeks - but was "waiting for today's visit". His weight is actually down considerably - this is intentional - eating only one meal day - not really much salt used. No chest pain. Some shortness of breath - then says that "has been bad". Energy level is "bad". Cough with clear sputum. Multiple sick contacts. No fever.   Past Medical History:  Diagnosis Date  . Anemia   . 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 Asthma   . Chronic combined systolic (congestive) and diastolic (congestive) heart failure ([place])    a. 12/31/14: 2D ECHO: EF 40-45%, HK of inf myocardium, G1DD, mod MR  . CKD (chronic kidney disease), stage III (HCC)    stage 3 kidney disease  . Coronary artery disease    a. LHC 01/2015 - triple vessel CAD (mod oLM, mLAD, severe mRCA, intermediate branch stenosis, CTO of mCx). Plan CABG 02/2015.  Marland Kitchen GERD (gastroesophageal reflux disease)   . Hyperlipidemia   . Hypertension   . Mitral regurgitation    a. Mild-mod by echo 12/2014.  Marland Kitchen Myocardial infarction [facility])    pt. states per [name] he has in the past  . NSVT (nonsustained ventricular tachycardia) ([place])    a. 9 beats during 01/2015 adm. BB titrated.  . Obesity  a. BMI 33  . OSA (obstructive sleep apnea) 01/16/2018   Mild obstructive sleep apnea overall with an AHI of 7.3/h and no significant central sleep apnea. Severe obstructive sleep apnea during REM sleep with an AHI of 34.3/h.  Now on CPAP at 6 cm H2O.   . Sleep apnea    2019, Dr.Turner diagnoised   . Type II diabetes mellitus ([place])     Past Surgical History:  Procedure Laterality Date  . BACK SURGERY    . CARDIAC CATHETERIZATION N/A 02/09/2015   Procedure: Left Heart Cath and Coronary Angiography;  Surgeon: Burnell Blanks, MD;  Location: [place] [facility];  Service: Cardiovascular;  Laterality: N/A;  . COLONOSCOPY  2011   Dr. [name]: Ileocecal valve appeared normal, scattered pancolonic diverticulosis, difficult  bowel prep making smaller lesions potentially missed. Recommended three-year follow-up colonoscopy.  . COLONOSCOPY  2003   Dr. [name]: Suspicious lesion at the ileocecal valve, multiple biopsies benign, pancolonic diverticulosis.  . COLONOSCOPY N/A 02/15/2016   diverticulosis in sigmoid and descending colon, single 5 mm polyp at splenic flexure (Tubular adenoma)  . CORONARY ARTERY BYPASS GRAFT N/A 02/18/2015   Procedure: CORONARY ARTERY BYPASS GRAFTING (CABG) x  four, using bilateral internal mammary arteries and right leg greater saphenous vein harvested endoscopically;  Surgeon: [name], MD;  Location: [place] OR;  Service: Open Heart Surgery;  Laterality: N/A;  . ESOPHAGOGASTRODUODENOSCOPY N/A 02/15/2016   normal  . EYE SURGERY Right    July 2019   . GIVENS CAPSULE STUDY N/A 01/08/2017   couple of gastric and small bowel eroions in setting of aspirin 81 mg daily but nothing concerning, continue Hematology follow-up  . HERNIA REPAIR  [id number]   Umbilical  . LUMBAR [place] SURGERY  2006   L4 & L5  . REFRACTIVE SURGERY Left    2019   . REFRACTIVE SURGERY Right    2019  . TEE WITHOUT CARDIOVERSION N/A 02/18/2015   Procedure: TRANSESOPHAGEAL ECHOCARDIOGRAM (TEE);  Surgeon: [name], MD;  Location: [facility];  Service: Open Heart Surgery;  Laterality: N/A;  . TONSILLECTOMY  1962     Medications: Current Meds  Medication Sig  . acetaminophen (TYLENOL) 500 MG tablet Take 1,000 mg by mouth 2 (two) times daily as needed for moderate pain.  Marland Kitchen albuterol (PROVENTIL) (2.5 MG/3ML) 0.083% nebulizer solution USE ONE VIAL IN NEBULIZER EVERY 6 HOURS AS NEEDED FOR WHEEZING FOR SHORTNESS OF BREATH  . amLODipine (NORVASC) 10 MG tablet TAKE 1 TABLET BY MOUTH ONCE DAILY  . aspirin EC 81 MG tablet Take 1 tablet (81 mg total) by mouth daily.  Marland Kitchen atorvastatin (LIPITOR) 40 MG tablet Take 1 tablet (40 mg total) by mouth daily.  . carvedilol (COREG) 25 MG tablet Take 25 mg by mouth 2 (two) times daily with a meal.  .  Cholecalciferol ([employer] VITAMIN D3) 1000 UNITS capsule Take 2 capsules (2,000 Units total) by mouth daily.  Marland Kitchen CINNAMON PO Take 1,000 mg by mouth 2 (two) times daily.   . Coenzyme Q10 (CO Q 10 PO) Take 200 mg by mouth daily.   . fluticasone (FLONASE) 50 MCG/ACT nasal spray Place 2 sprays into both nostrils daily as needed for allergies or rhinitis (SEASONAL ALLERGIES).  . Fluticasone-Salmeterol (ADVAIR DISKUS) 100-50 MCG/DOSE AEPB Inhale 1 puff into the lungs 2 (two) times daily.  . furosemide (LASIX) 40 MG tablet TAKE 1 & 1/2 (ONE & ONE-HALF) TABLETS BY MOUTH TWICE DAILY  . glucose blood (ONE TOUCH ULTRA TEST) test  strip Use to test blood sugar three times daily. Dx: E11.21  . hydrALAZINE (APRESOLINE) 50 MG tablet Take 1 tablet (50 mg total) by mouth 2 (two) times daily.  . insulin detemir (LEVEMIR) 100 UNIT/ML injection Inject 0.6 mLs (60 Units total) into the skin 2 (two) times daily. E11.65  . montelukast (SINGULAIR) 10 MG tablet TAKE 1 TABLET BY MOUTH ONCE DAILY AT BEDTIME  . Multiple Vitamins-Minerals (ONE-A-DAY MENS 50+ ADVANTAGE) TABS Take 1 tablet by mouth daily.  . nitroGLYCERIN (NITROSTAT) 0.4 MG SL tablet Place 1 tablet (0.4 mg total) under the tongue every 5 (five) minutes as needed for chest pain.  Marland Kitchen PROAIR HFA 108 (90 Base) MCG/ACT inhaler INHALE 2 PUFFS INTO LUNGS EVERY 6 HOURS AS NEEDED FOR SHORTNESS OF BREATH  . sacubitril-valsartan (ENTRESTO) 49-51 MG Take 1 tablet by mouth 2 (two) times daily.     Allergies: No Known Allergies  Social History: The patient  reports that he quit smoking about 3 years ago. His smoking use included cigars. He quit after 17.00 years of use. He has never used smokeless tobacco. He reports current alcohol use. He reports that he does not use drugs.   Family History: The patient's family history includes Colon cancer in his paternal uncle; Diabetes in his mother; Heart disease in his father and mother; Hypertension in his father; Kidney disease in his  daughter; Stroke in his sister; Sudden death in his daughter.   Review of Systems: Please see the history of present illness.   Otherwise, the review of systems is positive for none.   All other systems are reviewed and negative.   Physical Exam: VS:  BP 118/66 (BP Location: Left Arm, Patient Position: Sitting, Cuff Size: Normal)   Pulse 65   Ht 6' 1.5" (1.867 m)   Wt 226 lb 12.8 oz (102.9 kg)   SpO2 93% Comment: at rest  BMI 29.52 kg/m  .  BMI Body mass index is 29.52 kg/m.  Wt Readings from Last 3 Encounters:  04/01/18 226 lb 12.8 oz (102.9 kg)  02/27/18 240 lb (108.9 kg)  02/17/18 239 lb (108.4 kg)    General: Pleasant. Well developed, well nourished and in no acute distress.  His weight is down. He does look fatigued. Not overtly pale.  HEENT: Normal.  Neck: Supple, no JVD, carotid bruits, or masses noted.  Cardiac: Regular rate and rhythm. No murmurs, rubs, or gallops. No edema.  Respiratory:  Lungs with very decreased breath sounds - few rhonchi noted.  He has normal work of breathing.  GI: Soft and nontender.  MS: No deformity or atrophy. Gait and ROM intact.  Skin: Warm and dry. Color is normal.  Neuro:  Strength and sensation are intact and no gross focal deficits noted.  Psych: Alert, appropriate and with normal affect.   LABORATORY DATA:  EKG:  EKG is not ordered today.  Lab Results  Component Value Date   WBC 4.1 02/10/2018   HGB 10.0 (L) 02/10/2018   HCT 30.5 (L) 02/10/2018   PLT 239 02/10/2018   GLUCOSE 45 (L) 03/12/2018   CHOL 180 02/27/2018   TRIG 132 02/27/2018   HDL 42 02/27/2018   LDLCALC 113 (H) 02/27/2018   ALT 21 02/10/2018   AST 24 02/10/2018   NA 138 03/12/2018   K 4.5 03/12/2018   CL 99 03/12/2018   CREATININE 3.79 (H) 03/12/2018   BUN 57 (H) 03/12/2018   CO2 23 03/12/2018   TSH 0.94 12/19/2015   PSA 0.8  12/19/2015   INR 1.44 02/18/2015   HGBA1C 7.1 (H) 02/27/2018   MICROALBUR 271.8 01/10/2017     BNP (last 3 results) No  results for input(s): BNP in the last 8760 hours.  ProBNP (last 3 results) No results for input(s): PROBNP in the last 8760 hours.   Other Studies Reviewed Today:  Echocardiogram 11/15/2016: - Left ventricle: The cavity size was mildly dilated. Wall thickness was normal. Systolic function was mildly to moderately reduced. The estimated ejection fraction was in the range of 40% to 45%. There is hypokinesis of the inferolateral and inferior myocardium. Doppler parameters are consistent with abnormal left ventricular relaxation (grade 1 diastolic dysfunction). - Ascending aorta: The ascending aorta was mildly dilated. - Mitral valve: Calcified annulus. There was mild regurgitation. - Left atrium: The atrium was mildly dilated. - Right atrium: The atrium was mildly dilated. - Pulmonary arteries: Systolic pressure was mildly increased. PA peak pressure: 44 mm Hg (S).  Impressions:  - Hypokinesis of the inferior and inferolateral walls with overall mild to moderate LV dysfunction; mild diastolic dysfunction; mildly dilated ascending aorta; mild MR; mild biatrial enlargement; mild TR with mildly elevated pulmonary pressure.  MyoviewStudy Highlights9/2018    The left ventricular ejection fraction is moderately decreased (30-44%).  Nuclear stress EF: 40%.  There was no ST segment deviation noted during stress.  Findings consistent with prior myocardial infarction with peri-infarct ischemia.  This is an intermediate risk study.  1. EF 40% with inferolateral hypokinesis.  2. Primarily fixed medium-sized, moderate intensity basal to mid inferolateral and basal inferior perfusion defect. This is suggestive of prior infarction with mild peri-infarct ischemia.   Intermediate risk study primarily due to low EF.     Assessment/Plan:  1. Multitude of somatic complaints - worrisome for heart failure/bronchitis/pneumonia - checking labs today, arranging CXR  and giving Zpak. Will hold on increasing Lasix for now. Will see what his labs and CXR show. Further disposition to follow.   2. Ischemic CM - see above.   3. CAD - prior CABG - last nuclear was in 2018 and was stable. He has no active chest pain.   4. Chronic systolic HF - on Entresto - see above.   5. HTN - BP ok at this time  6. Worsening CKD - says he is seeing Dr. Posey Pronto next week. He does remain on Entresto - rechecking lab today. Unclear to me what the plan long term may be ? Dialysis.   7. Chronic anemia - rechecking lab today.   8. Prior eye hemorrhage - not discussed today  9. OSA - on CPAP   Current medicines are reviewed with the patient today.  The patient does not have concerns regarding medicines other than what has been noted above.  The following changes have been made:  See above.  Labs/ tests ordered today include:    Orders Placed This Encounter  Procedures  . DG Chest 2 View  . Basic metabolic panel  . CBC  . Hepatic function panel  . Pro b natriuretic peptide (BNP)  . ECHOCARDIOGRAM COMPLETE     Disposition:   FU with me or [name] in 4 to 6 weeks.     Patient is agreeable to this plan and will call if any problems develop in the interim.   SignedTruitt Merle, NP  04/01/2018 9:48 AM  [place] [address] [place], [place]  [address] Phone: [phone] Fax: [phone]

## 2018-04-01 NOTE — Patient Instructions (Signed)
We will be checking the following labs today - BMET, CBC, HPF and BNP  Please go to [facility] to [place] on the first floor for a chest Xray - you may walk in.     Medication Instructions:    Continue with your current medicines. BUT  I have sent in a RX for Zithromax - take 2 pills today then one pill daily til finished.   If you need a refill on your cardiac medications before your next appointment, please call your pharmacy.     Testing/Procedures To Be Arranged:  Echocardiogram  Follow-Up:   See me or [name] in about 4 to 6 weeks    At [facility], you and your health needs are our priority.  As part of our continuing mission to provide you with exceptional heart care, we have created designated Provider Care Teams.  These Care Teams include your primary Cardiologist (physician) and Advanced Practice Providers (APPs -  Physician Assistants and Nurse Practitioners) who all work together to provide you with the care you need, when you need it.  Special Instructions:  . I will send your labs to Dr. Posey Pronto.  . Once I see your labs, we will decide if you need more fluid pills.   Call the [place] office at [phone] if you have any questions, problems or concerns.

## 2018-04-02 ENCOUNTER — Other Ambulatory Visit: Payer: Self-pay | Admitting: *Deleted

## 2018-04-02 LAB — BASIC METABOLIC PANEL
BUN/Creatinine Ratio: 15 (ref 10–24)
BUN: 74 mg/dL — ABNORMAL HIGH (ref 8–27)
CO2: 22 mmol/L (ref 20–29)
Calcium: 9 mg/dL (ref 8.6–10.2)
Chloride: 100 mmol/L (ref 96–106)
Creatinine, Ser: 4.85 mg/dL — ABNORMAL HIGH (ref 0.76–1.27)
GFR calc Af Amer: 13 mL/min/{1.73_m2} — ABNORMAL LOW (ref >59)
GFR calc non Af Amer: 11 mL/min/{1.73_m2} — ABNORMAL LOW (ref >59)
Glucose: 101 mg/dL — ABNORMAL HIGH (ref 65–99)
Potassium: 4.4 mmol/L (ref 3.5–5.2)
Sodium: 143 mmol/L (ref 134–144)

## 2018-04-02 LAB — HEPATIC FUNCTION PANEL
ALT: 16 IU/L (ref 0–44)
AST: 25 IU/L (ref 0–40)
Albumin: 3.7 g/dL — ABNORMAL LOW (ref 3.8–4.8)
Alkaline Phosphatase: 70 IU/L (ref 39–117)
Bilirubin Total: <0.2 mg/dL (ref 0.0–1.2)
Bilirubin, Direct: 0.05 mg/dL (ref 0.00–0.40)
Total Protein: 5.8 g/dL — ABNORMAL LOW (ref 6.0–8.5)

## 2018-04-02 LAB — CBC
Hematocrit: 28.6 % — ABNORMAL LOW (ref 37.5–51.0)
Hemoglobin: 10 g/dL — ABNORMAL LOW (ref 13.0–17.7)
MCH: 33.7 pg — ABNORMAL HIGH (ref 26.6–33.0)
MCHC: 35 g/dL (ref 31.5–35.7)
MCV: 96 fL (ref 79–97)
Platelets: 290 10*3/uL (ref 150–450)
RBC: 2.97 x10E6/uL — ABNORMAL LOW (ref 4.14–5.80)
RDW: 12.2 % (ref 11.6–15.4)
WBC: 3.8 10*3/uL (ref 3.4–10.8)

## 2018-04-02 LAB — PRO B NATRIURETIC PEPTIDE: NT-Pro BNP: 2186 pg/mL — ABNORMAL HIGH (ref 0–376)

## 2018-04-02 MED ORDER — ISOSORBIDE MONONITRATE ER 30 MG PO TB24: 30.0000 mg | ORAL_TABLET | Freq: Every day | ORAL | 3 refills | Status: DC

## 2018-04-04 ENCOUNTER — Ambulatory Visit (HOSPITAL_COMMUNITY)
Admission: RE | Admit: 2018-04-04 | Discharge: 2018-04-04 | Disposition: A | Discharge status: Home / Self Care | Payer: Medicare Other | Source: Ambulatory Visit | Attending: Nurse Practitioner | Admitting: Nurse Practitioner

## 2018-04-04 DIAGNOSIS — I255 Ischemic cardiomyopathy: Secondary | ICD-10-CM | POA: Insufficient documentation

## 2018-04-04 NOTE — Progress Notes (Signed)
*  PRELIMINARY RESULTS* Echocardiogram 2D Echocardiogram has been performed.  Henry Carroll 04/04/2018, 9:17 AM

## 2018-04-07 ENCOUNTER — Other Ambulatory Visit: Payer: Self-pay | Admitting: *Deleted

## 2018-04-07 DIAGNOSIS — D649 Anemia, unspecified: Secondary | ICD-10-CM | POA: Diagnosis not present

## 2018-04-07 DIAGNOSIS — R918 Other nonspecific abnormal finding of lung field: Secondary | ICD-10-CM

## 2018-04-07 DIAGNOSIS — N2581 Secondary hyperparathyroidism of renal origin: Secondary | ICD-10-CM | POA: Diagnosis not present

## 2018-04-07 DIAGNOSIS — I129 Hypertensive chronic kidney disease with stage 1 through stage 4 chronic kidney disease, or unspecified chronic kidney disease: Secondary | ICD-10-CM | POA: Diagnosis not present

## 2018-04-07 DIAGNOSIS — N184 Chronic kidney disease, stage 4 (severe): Secondary | ICD-10-CM | POA: Diagnosis not present

## 2018-04-09 ENCOUNTER — Encounter: Payer: Self-pay | Admitting: *Deleted

## 2018-04-22 DIAGNOSIS — N184 Chronic kidney disease, stage 4 (severe): Secondary | ICD-10-CM | POA: Diagnosis not present

## 2018-04-28 ENCOUNTER — Telehealth: Payer: Self-pay | Admitting: Cardiology

## 2018-04-28 NOTE — Telephone Encounter (Signed)
Spoke to Mr. Sarr, he is doing very well.  No complaints.  Understood that we will be postponing his clinic follow-up visit secondary to coronavirus.  Encouraged his continue safety. Candee Furbish, MD

## 2018-05-01 ENCOUNTER — Ambulatory Visit: Payer: Medicare Other | Admitting: Cardiology

## 2018-05-12 ENCOUNTER — Ambulatory Visit: Payer: Self-pay

## 2018-05-12 ENCOUNTER — Encounter: Payer: Medicare Other | Admitting: Family

## 2018-05-13 ENCOUNTER — Other Ambulatory Visit (HOSPITAL_COMMUNITY): Payer: Medicare Other

## 2018-05-16 ENCOUNTER — Encounter: Payer: Medicare Other | Admitting: Family

## 2018-05-20 ENCOUNTER — Ambulatory Visit (HOSPITAL_COMMUNITY): Payer: Medicare Other | Admitting: Nurse Practitioner

## 2018-05-28 DIAGNOSIS — N184 Chronic kidney disease, stage 4 (severe): Secondary | ICD-10-CM | POA: Diagnosis not present

## 2018-05-29 ENCOUNTER — Ambulatory Visit (INDEPENDENT_AMBULATORY_CARE_PROVIDER_SITE_OTHER): Payer: Medicare Other | Admitting: Nurse Practitioner

## 2018-05-29 ENCOUNTER — Other Ambulatory Visit: Payer: Self-pay

## 2018-05-29 ENCOUNTER — Encounter: Payer: Self-pay | Admitting: Nurse Practitioner

## 2018-05-29 DIAGNOSIS — I5022 Chronic systolic (congestive) heart failure: Secondary | ICD-10-CM

## 2018-05-29 DIAGNOSIS — E1169 Type 2 diabetes mellitus with other specified complication: Secondary | ICD-10-CM | POA: Diagnosis not present

## 2018-05-29 DIAGNOSIS — G4733 Obstructive sleep apnea (adult) (pediatric): Secondary | ICD-10-CM

## 2018-05-29 DIAGNOSIS — D509 Iron deficiency anemia, unspecified: Secondary | ICD-10-CM | POA: Diagnosis not present

## 2018-05-29 DIAGNOSIS — IMO0002 Reserved for concepts with insufficient information to code with codable children: Secondary | ICD-10-CM

## 2018-05-29 DIAGNOSIS — I2510 Atherosclerotic heart disease of native coronary artery without angina pectoris: Secondary | ICD-10-CM | POA: Diagnosis not present

## 2018-05-29 DIAGNOSIS — E785 Hyperlipidemia, unspecified: Secondary | ICD-10-CM | POA: Diagnosis not present

## 2018-05-29 DIAGNOSIS — N184 Chronic kidney disease, stage 4 (severe): Secondary | ICD-10-CM | POA: Diagnosis not present

## 2018-05-29 DIAGNOSIS — E1121 Type 2 diabetes mellitus with diabetic nephropathy: Secondary | ICD-10-CM | POA: Diagnosis not present

## 2018-05-29 DIAGNOSIS — E1165 Type 2 diabetes mellitus with hyperglycemia: Secondary | ICD-10-CM

## 2018-05-29 NOTE — Progress Notes (Signed)
This service is provided via telemedicine  No vital signs collected/recorded due to the encounter was a telemedicine visit.   Location of patient (ex: home, work):  Home  Patient consents to a telephone visit:  Yes  Location of the provider (ex: office, home):  Office  Names of all persons participating in the telemedicine service and their role in the encounter:  Ruthell Rummage CMA, Sherrie Mustache NP and patient   Time spent on call: Ruthell Rummage CMA spent  6   Minutes with patient on phone   Virtual Visit via Telephone Note  I connected with Henry Carroll on 05/29/18 at  1:00 PM EDT by telephone and verified that I am speaking with the correct person using two identifiers.   I discussed the limitations, risks, security and privacy concerns of performing an evaluation and management service by telephone and the availability of in person appointments. I also discussed with the patient that there may be a patient responsible charge related to this service. The patient expressed understanding and agreed to proceed.     Careteam: Patient Care Team: [name], NP as PCP - General (Geriatric Medicine) Jerline Pain, MD as PCP - Cardiology (Cardiology) Jerline Pain, MD as Consulting Physician (Cardiology) Fleet Contras, MD as Consulting Physician (Nephrology) Rutherford Guys, MD as Consulting Physician (Ophthalmology) [name], MD as Consulting Physician (Gastroenterology)  Advanced Directive information Does Patient Have a Medical Advance Directive?: No, Would patient like information on creating a medical advance directive?: No - Patient declined  No Known Allergies  Chief Complaint  Patient presents with  . Medical Management of Chronic Issues    3 month follow up      HPI: Patient is a 68 y.o. male  following up on chronic conditions  DM - reports blood sugars are great, new program with insurance. States they have been in range for the  past month.  No numbness or tingling. . Uses 40 units levemir daily (decreased it to avoid hypoglycemia) has changed diet. Losing weight.  A1c7.1 in January and he feels like it is better now.   multivessel CAD - stable. He underwent CABG x 4 vessels by Dr Charlott Rakes on 02/18/15. Vein harvested from right leg. Followed by cardio Dr Marlou Porch. On imdur  HTN/CHF - stable on lasix, coreg, hydralazine, amlodipine. Takes ASA daily. Followed by cardio Dr Marlou Porch  Hyperlipidemia - started on Crestor but it was too expensive so he went back to lipitor. No myalgias. LDL 113 on last labs, he has made dietary modifications.   Asthma -using advair and albuterol neb. Takes singulair. Feels like asthma is well controlled. Does not need albuterol often. not effected by pollen a lot right now, staying inside.   CKD - stage 4. followed by nephrology Dr Posey Pronto . Kidney function continues to worsen. Was placed on transplant list but now this is on hold because his EF was reduced. He plans to start HD until July and then they will re-evaluate.   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 Has needed PRBC in the past. 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  HENT: Negative for tinnitus.   Respiratory: Negative for cough, sputum production and shortness of breath.   Cardiovascular: Negative for chest pain, palpitations and leg swelling.  Gastrointestinal: Negative for abdominal pain, constipation,  diarrhea and heartburn.  Genitourinary: Negative for dysuria, frequency and urgency.  Musculoskeletal: Negative for back pain, falls, joint pain and myalgias.  Skin: Negative.   Neurological: Negative for dizziness and headaches.  Psychiatric/Behavioral: Negative for depression and memory loss. The patient does not have insomnia.     Past Medical History:   Diagnosis Date  . Anemia   . 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 Asthma   . Chronic combined systolic (congestive) and diastolic (congestive) heart failure ([place])    a. 12/31/14: 2D ECHO: EF 40-45%, HK of inf myocardium, G1DD, mod MR  . CKD (chronic kidney disease), stage III (HCC)    stage 3 kidney disease  . Coronary artery disease    a. LHC 01/2015 - triple vessel CAD (mod oLM, mLAD, severe mRCA, intermediate branch stenosis, CTO of mCx). Plan CABG 02/2015.  Marland Kitchen GERD (gastroesophageal reflux disease)   . Hyperlipidemia   . Hypertension   . Mitral regurgitation    a. Mild-mod by echo 12/2014.  Marland Kitchen Myocardial infarction [facility])    pt. states per [name] he has in the past  . NSVT (nonsustained ventricular tachycardia) ([place])    a. 9 beats during 01/2015 adm. BB titrated.  . Obesity    a. BMI 33  . OSA (obstructive sleep apnea) 01/16/2018   Mild obstructive sleep apnea overall with an AHI of 7.3/h and no significant central sleep apnea. Severe obstructive sleep apnea during REM sleep with an AHI of 34.3/h.  Now on CPAP at 6 cm H2O.   . Sleep apnea    2019, Dr.Turner diagnoised   . Type II diabetes mellitus ([place])    Past Surgical History:  Procedure Laterality Date  . BACK SURGERY    . CARDIAC CATHETERIZATION N/A 02/09/2015   Procedure: Left Heart Cath and Coronary Angiography;  Surgeon: Burnell Blanks, MD;  Location: [facility];  Service: Cardiovascular;  Laterality: N/A;  . COLONOSCOPY  2011   Dr. [name]: Ileocecal valve appeared normal, scattered pancolonic diverticulosis, difficult bowel prep making smaller lesions potentially missed. Recommended three-year follow-up colonoscopy.  . COLONOSCOPY  2003   Dr. [name]: Suspicious lesion at the ileocecal valve, multiple biopsies benign, pancolonic diverticulosis.  . COLONOSCOPY N/A 02/15/2016   diverticulosis in sigmoid and descending colon, single 5 mm polyp at splenic flexure (Tubular adenoma)  . CORONARY ARTERY BYPASS GRAFT  N/A 02/18/2015   Procedure: CORONARY ARTERY BYPASS GRAFTING (CABG) x  four, using bilateral internal mammary arteries and right leg greater saphenous vein harvested endoscopically;  Surgeon: [name], MD;  Location: [facility];  Service: Open Heart Surgery;  Laterality: N/A;  . ESOPHAGOGASTRODUODENOSCOPY N/A 02/15/2016   normal  . EYE SURGERY Right    July 2019   . GIVENS CAPSULE STUDY N/A 01/08/2017   couple of gastric and small bowel eroions in setting of aspirin 81 mg daily but nothing concerning, continue Hematology follow-up  . HERNIA REPAIR  3825   Umbilical  . LUMBAR [place] SURGERY  2006   L4 & L5  . REFRACTIVE SURGERY Left    2019   . REFRACTIVE SURGERY Right    2019  . TEE WITHOUT CARDIOVERSION N/A 02/18/2015   Procedure: TRANSESOPHAGEAL ECHOCARDIOGRAM (TEE);  Surgeon: [name], MD;  Location: [facility];  Service: Open Heart Surgery;  Laterality: N/A;  . TONSILLECTOMY  1962   Social History:   reports that he quit smoking about 3 years ago. His smoking use included cigars. He  quit after 17.00 years of use. He has never used smokeless tobacco. He reports current alcohol use. He reports that he does not use drugs.  Family History  Problem Relation Age of Onset  . Diabetes Mother   . Heart disease Mother        later in life, age >43  . Heart disease Father        heart failure later in life  . Hypertension Father   . Stroke Sister   . Kidney disease Daughter   . Sudden death Daughter   . Colon cancer Paternal Uncle        Passed from colon CA  . Heart attack Neg Hx     Medications: Patient's Medications  New Prescriptions   No medications on file  Previous Medications   ACETAMINOPHEN (TYLENOL) 500 MG TABLET    Take 1,000 mg by mouth 2 (two) times daily as needed for moderate pain.   ALBUTEROL (PROVENTIL) (2.5 MG/3ML) 0.083% NEBULIZER SOLUTION    USE ONE VIAL IN NEBULIZER EVERY 6 HOURS AS NEEDED FOR WHEEZING FOR SHORTNESS OF BREATH   AMLODIPINE (NORVASC) 10 MG TABLET     TAKE 1 TABLET BY MOUTH ONCE DAILY   ASPIRIN EC 81 MG TABLET    Take 1 tablet (81 mg total) by mouth daily.   ATORVASTATIN (LIPITOR) 40 MG TABLET    Take 1 tablet (40 mg total) by mouth daily.   CARVEDILOL (COREG) 25 MG TABLET    Take 25 mg by mouth 2 (two) times daily with a meal.   CHOLECALCIFEROL ([employer] VITAMIN D3) 1000 UNITS CAPSULE    Take 2 capsules (2,000 Units total) by mouth daily.   CINNAMON PO    Take 1,000 mg by mouth 2 (two) times daily.    COENZYME Q10 (CO Q 10 PO)    Take 200 mg by mouth daily.    FLUTICASONE (FLONASE) 50 MCG/ACT NASAL SPRAY    Place 2 sprays into both nostrils daily as needed for allergies or rhinitis (SEASONAL ALLERGIES).   FLUTICASONE-SALMETEROL (ADVAIR DISKUS) 100-50 MCG/DOSE AEPB    Inhale 1 puff into the lungs 2 (two) times daily.   FUROSEMIDE (LASIX) 40 MG TABLET    TAKE 1 & 1/2 (ONE & ONE-HALF) TABLETS BY MOUTH TWICE DAILY   GLUCOSE BLOOD (ONE TOUCH ULTRA TEST) TEST STRIP    Use to test blood sugar three times daily. Dx: E11.21   HYDRALAZINE (APRESOLINE) 50 MG TABLET    Take 50 mg by mouth 2 (two) times daily.   ISOSORBIDE MONONITRATE (IMDUR) 30 MG 24 HR TABLET    Take 1 tablet (30 mg total) by mouth daily.   MONTELUKAST (SINGULAIR) 10 MG TABLET    TAKE 1 TABLET BY MOUTH ONCE DAILY AT BEDTIME   MULTIPLE VITAMINS-MINERALS (ONE-A-DAY MENS 50+ ADVANTAGE) TABS    Take 1 tablet by mouth daily.   NITROGLYCERIN (NITROSTAT) 0.4 MG SL TABLET    Place 1 tablet (0.4 mg total) under the tongue every 5 (five) minutes as needed for chest pain.   PROAIR HFA 108 (90 BASE) MCG/ACT INHALER    INHALE 2 PUFFS INTO LUNGS EVERY 6 HOURS AS NEEDED FOR SHORTNESS OF BREATH  Modified Medications   No medications on file  Discontinued Medications   AZITHROMYCIN (ZITHROMAX Z-PAK) 250 MG TABLET    Take 2 pills today, then one pill daily til finished.   HYDRALAZINE (APRESOLINE) 50 MG TABLET    Take 1 tablet (50 mg total) by mouth 2 (  two) times daily.   INSULIN DETEMIR (LEVEMIR) 100  UNIT/ML INJECTION    Inject 0.6 mLs (60 Units total) into the skin 2 (two) times daily. E11.65     Physical Exam:unable due to tele-visit   Labs reviewed: Basic Metabolic Panel: Recent Labs    02/27/18 1358 03/12/18 1322 04/01/18 1019  NA 140 138 143  K 4.4 4.5 4.4  CL 105 99 100  CO2 29 23 22   GLUCOSE 66 45* 101*  BUN 57* 57* 74*  CREATININE 3.47* 3.79* 4.85*  CALCIUM 9.1 9.1 9.0   Liver Function Tests: Recent Labs    09/16/17 1512 02/10/18 1311 04/01/18 1019  AST 32 24 25  ALT 25 21 16   ALKPHOS 75 52 70  BILITOT 0.5 0.5 <0.2  PROT 6.9 5.9* 5.8*  ALBUMIN 3.5 3.1* 3.7*   No results for input(s): LIPASE, AMYLASE in the last 8760 hours. No results for input(s): AMMONIA in the last 8760 hours. CBC: Recent Labs    09/16/17 1512 02/10/18 1311 03/31/18 04/01/18 1019  WBC 4.0 4.1  --  3.8  NEUTROABS 2.5 2.7  --   --   HGB 10.6* 10.0* 10.0* 10.0*  HCT 31.5* 30.5* 30* 28.6*  MCV 92.1 96.5  --  96  PLT 257 239  --  290   Lipid Panel: Recent Labs    02/27/18 1358  CHOL 180  HDL 42  LDLCALC 113*  TRIG 132  CHOLHDL 4.3   TSH: No results for input(s): TSH in the last 8760 hours. A1C: Lab Results  Component Value Date   HGBA1C 7.1 (H) 02/27/2018     Assessment/Plan   1. Hyperlipidemia associated with type 2 diabetes mellitus ([place]) -continues with lipitor and heart healthy diet, could not afford crestor so went back to lipitor, has made diet  - Lipid Panel; Future  2. Uncontrolled type 2 diabetes mellitus with diabetic nephropathy, without long-term current use of insulin ([place]) Has worked hard to control diabetes with diet modifications, has decrease levemir to 40 units and blood sugars have remained controlled.  - Hemoglobin A1c; Future  3. OSA (obstructive sleep apnea) Continues on CPAP  4. Iron deficiency anemia, unspecified iron deficiency anemia type hgb stable, continues to follow up with hematology  5. Chronic systolic heart failure (HCC)  Stable, without swelling, chest pains or shortness of breath. Continues with follow up with cardiologist.   6. CAD in native artery Stable, no chest pains noted, continues on imdur.  7. Stage 4 chronic kidney disease ([place]) Ongoing follow up with nephrology. Has not started HD at this time  Next appt: 06/20/2018 [facility] Ritchie Klee K. Lott Seelbach, [facility] [phone]      I discussed the assessment and treatment plan with the patient. The patient was provided an opportunity to ask questions and all were answered. The patient agreed with the plan and demonstrated an understanding of the instructions.   The patient was advised to call back or seek an in-person evaluation if the symptoms worsen or if the condition fails to improve as anticipated.  I provided 16 minutes of non-face-to-face time during this encounter.  Avs printed and mailed   Sherrie Mustache, NP

## 2018-05-29 NOTE — Patient Instructions (Signed)
Keep up the good work with diet modifications.   Continue follow up with specialist.   Follow up in 3 months with labs prior to visit

## 2018-06-02 DIAGNOSIS — N184 Chronic kidney disease, stage 4 (severe): Secondary | ICD-10-CM | POA: Diagnosis not present

## 2018-06-02 DIAGNOSIS — I129 Hypertensive chronic kidney disease with stage 1 through stage 4 chronic kidney disease, or unspecified chronic kidney disease: Secondary | ICD-10-CM | POA: Diagnosis not present

## 2018-06-02 DIAGNOSIS — D649 Anemia, unspecified: Secondary | ICD-10-CM | POA: Diagnosis not present

## 2018-06-02 DIAGNOSIS — N2581 Secondary hyperparathyroidism of renal origin: Secondary | ICD-10-CM | POA: Diagnosis not present

## 2018-06-08 ENCOUNTER — Other Ambulatory Visit: Payer: Self-pay | Admitting: Cardiology

## 2018-06-20 ENCOUNTER — Encounter: Payer: Self-pay | Admitting: Nurse Practitioner

## 2018-06-20 ENCOUNTER — Ambulatory Visit (INDEPENDENT_AMBULATORY_CARE_PROVIDER_SITE_OTHER): Payer: Medicare Other | Admitting: Nurse Practitioner

## 2018-06-20 ENCOUNTER — Other Ambulatory Visit: Payer: Self-pay

## 2018-06-20 DIAGNOSIS — Z0000 Encounter for general adult medical examination without abnormal findings: Secondary | ICD-10-CM

## 2018-06-20 NOTE — Progress Notes (Signed)
Patient ID: Henry Carroll, male   DOB: [birth year], 68 y.o.   MRN: [id number] This service is provided via telemedicine  No vital signs collected/recorded due to the encounter was a telemedicine visit.   Location of patient (ex: home, work):  HOME  Patient consents to a telephone visit:  YES  Location of the provider (ex: office, home):  OFFICE  Name of any referring provider:  N/A  Names of all persons participating in the telemedicine service and their role in the encounter:  PATIENT, DESHANNON SMITH CMA, Sherrie Mustache NP  Time spent on call:  6:05

## 2018-06-20 NOTE — Progress Notes (Signed)
Subjective:   Henry Carroll is a 68 y.o. male who presents for Medicare Annual/Subsequent preventive examination.  Review of Systems:   Cardiac Risk Factors include: diabetes mellitus;advanced age (>38men, >37 women);dyslipidemia;male gender;hypertension;sedentary lifestyle     Objective:    Vitals: There were no vitals taken for this visit.  There is no height or weight on file to calculate BMI.  Advanced Directives 06/20/2018 05/29/2018 02/27/2018 02/18/2018 02/17/2018 09/26/2017 09/20/2017  Does Patient Have a Medical Advance Directive? No No No No No No No  Type of Advance Directive - - - Living will - - -  Does patient want to make changes to medical advance directive? - - No - Patient declined No - Patient declined - No - Patient declined No - Patient declined  Copy of [place] in Chart? - - - - - - -  Would patient like information on creating a medical advance directive? No - Patient declined No - Patient declined - No - Patient declined - No - Patient declined No - Patient declined    Tobacco Social History   Tobacco Use  Smoking Status Former Smoker  . Years: 17.00  . Types: Cigars  . Last attempt to quit: 12/31/2014  . Years since quitting: 3.4  Smokeless Tobacco Never Used     Counseling given: Not Answered   Clinical Intake:  Pre-visit preparation completed: Yes  Pain : No/denies pain     BMI - recorded: 29.2 Nutritional Status: BMI 25 -29 Overweight Nutritional Risks: None Diabetes: Yes CBG done?: No Did pt. bring in CBG monitor from home?: Yes Glucose Meter Downloaded?: No  How often do you need to have someone help you when you read instructions, pamphlets, or other written materials from your doctor or pharmacy?: 1 - Never What is the last grade level you completed in school?: master of business   Interpreter Needed?: No     Past Medical History:  Diagnosis Date  . Anemia   . 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 Asthma   .  Chronic combined systolic (congestive) and diastolic (congestive) heart failure ([place])    a. 12/31/14: 2D ECHO: EF 40-45%, HK of inf myocardium, G1DD, mod MR  . CKD (chronic kidney disease), stage III (HCC)    stage 3 kidney disease  . Coronary artery disease    a. LHC 01/2015 - triple vessel CAD (mod oLM, mLAD, severe mRCA, intermediate branch stenosis, CTO of mCx). Plan CABG 02/2015.  Marland Kitchen GERD (gastroesophageal reflux disease)   . Hyperlipidemia   . Hypertension   . Mitral regurgitation    a. Mild-mod by echo 12/2014.  Marland Kitchen Myocardial infarction [facility])    pt. states per [name] he has in the past  . NSVT (nonsustained ventricular tachycardia) ([place])    a. 9 beats during 01/2015 adm. BB titrated.  . Obesity    a. BMI 33  . OSA (obstructive sleep apnea) 01/16/2018   Mild obstructive sleep apnea overall with an AHI of 7.3/h and no significant central sleep apnea. Severe obstructive sleep apnea during REM sleep with an AHI of 34.3/h.  Now on CPAP at 6 cm H2O.   . Sleep apnea    2019, Dr.Turner diagnoised   . Type II diabetes mellitus ([place])    Past Surgical History:  Procedure Laterality Date  . BACK SURGERY    . CARDIAC CATHETERIZATION N/A 02/09/2015   Procedure: Left Heart Cath and Coronary Angiography;  Surgeon: Annita Brod  Angelena Form, MD;  Location: [facility];  Service: Cardiovascular;  Laterality: N/A;  . COLONOSCOPY  2011   Dr. [name]: Ileocecal valve appeared normal, scattered pancolonic diverticulosis, difficult bowel prep making smaller lesions potentially missed. Recommended three-year follow-up colonoscopy.  . COLONOSCOPY  2003   Dr. [name]: Suspicious lesion at the ileocecal valve, multiple biopsies benign, pancolonic diverticulosis.  . COLONOSCOPY N/A 02/15/2016   diverticulosis in sigmoid and descending colon, single 5 mm polyp at splenic flexure (Tubular adenoma)  . CORONARY ARTERY BYPASS GRAFT N/A 02/18/2015   Procedure: CORONARY ARTERY BYPASS GRAFTING (CABG) x  four,  using bilateral internal mammary arteries and right leg greater saphenous vein harvested endoscopically;  Surgeon: [name], MD;  Location: [facility];  Service: Open Heart Surgery;  Laterality: N/A;  . ESOPHAGOGASTRODUODENOSCOPY N/A 02/15/2016   normal  . EYE SURGERY Right    July 2019   . GIVENS CAPSULE STUDY N/A 01/08/2017   couple of gastric and small bowel eroions in setting of aspirin 81 mg daily but nothing concerning, continue Hematology follow-up  . HERNIA REPAIR  [id number]   Umbilical  . LUMBAR [place] SURGERY  2006   L4 & L5  . REFRACTIVE SURGERY Left    2019   . REFRACTIVE SURGERY Right    2019  . TEE WITHOUT CARDIOVERSION N/A 02/18/2015   Procedure: TRANSESOPHAGEAL ECHOCARDIOGRAM (TEE);  Surgeon: [name], MD;  Location: [facility];  Service: Open Heart Surgery;  Laterality: N/A;  . TONSILLECTOMY  1962   Family History  Problem Relation Age of Onset  . Diabetes Mother   . Heart disease Mother        later in life, age >29  . Heart disease Father        heart failure later in life  . Hypertension Father   . Stroke Sister   . Kidney disease Daughter   . Sudden death Daughter   . Colon cancer Paternal Uncle        Passed from colon CA  . Heart attack Neg Hx    Social History   Socioeconomic History  . Marital status: Married    Spouse name: Not on file  . Number of children: Not on file  . Years of education: Not on file  . Highest education level: Not on file  Occupational History  . Not on file  Social Needs  . Financial resource strain: Not hard at all  . Food insecurity:    Worry: Never true    Inability: Never true  . Transportation needs:    Medical: No    Non-medical: No  Tobacco Use  . Smoking status: Former Smoker    Years: 17.00    Types: Cigars    Last attempt to quit: 12/31/2014    Years since quitting: 3.4  . Smokeless tobacco: Never Used  Substance and Sexual Activity  . Alcohol use: Yes    Alcohol/week: 0.0 standard drinks    Comment:  occasional glass of wine.  . Drug use: No  . Sexual activity: Yes    Birth control/protection: None  Lifestyle  . Physical activity:    Days per week: 5 days    Minutes per session: 30 min  . Stress: Only a little  Relationships  . Social connections:    Talks on phone: More than three times a week    Gets together: More than three times a week    Attends religious service: More than 4 times per year  Active member of club or organization: Yes    Attends meetings of clubs or organizations: 1 to 4 times per year    Relationship status: Married  Other Topics Concern  . Not on file  Social History Narrative  . Not on file    Outpatient Encounter Medications as of 06/20/2018  Medication Sig  . acetaminophen (TYLENOL) 500 MG tablet Take 1,000 mg by mouth 2 (two) times daily as needed for moderate pain.  Marland Kitchen albuterol (PROVENTIL) (2.5 MG/3ML) 0.083% nebulizer solution USE ONE VIAL IN NEBULIZER EVERY 6 HOURS AS NEEDED FOR WHEEZING FOR SHORTNESS OF BREATH  . amLODipine (NORVASC) 10 MG tablet TAKE 1 TABLET BY MOUTH ONCE DAILY  . aspirin EC 81 MG tablet Take 1 tablet (81 mg total) by mouth daily.  Marland Kitchen atorvastatin (LIPITOR) 40 MG tablet TAKE 1 TABLET BY MOUTH EVERY EVENING  . carvedilol (COREG) 25 MG tablet Take 25 mg by mouth 2 (two) times daily with a meal.  . Cholecalciferol ([employer] VITAMIN D3) 1000 UNITS capsule Take 2 capsules (2,000 Units total) by mouth daily.  Marland Kitchen CINNAMON PO Take 1,000 mg by mouth 2 (two) times daily.   . Coenzyme Q10 (CO Q 10 PO) Take 200 mg by mouth daily.   . fluticasone (FLONASE) 50 MCG/ACT nasal spray Place 2 sprays into both nostrils daily as needed for allergies or rhinitis (SEASONAL ALLERGIES).  . Fluticasone-Salmeterol (ADVAIR DISKUS) 100-50 MCG/DOSE AEPB Inhale 1 puff into the lungs 2 (two) times daily.  . furosemide (LASIX) 40 MG tablet TAKE 1 & 1/2 (ONE & ONE-HALF) TABLETS BY MOUTH TWICE DAILY  . glucose blood (ONE TOUCH ULTRA TEST) test strip Use to test blood  sugar three times daily. Dx: E11.21  . hydrALAZINE (APRESOLINE) 50 MG tablet Take 50 mg by mouth 2 (two) times daily.  . insulin detemir (LEVEMIR) 100 UNIT/ML injection Inject 40 Units into the skin daily.  . isosorbide mononitrate (IMDUR) 30 MG 24 hr tablet Take 1 tablet (30 mg total) by mouth daily.  . montelukast (SINGULAIR) 10 MG tablet TAKE 1 TABLET BY MOUTH ONCE DAILY AT BEDTIME  . Multiple Vitamins-Minerals (ONE-A-DAY MENS 50+ ADVANTAGE) TABS Take 1 tablet by mouth daily.  . nitroGLYCERIN (NITROSTAT) 0.4 MG SL tablet Place 1 tablet (0.4 mg total) under the tongue every 5 (five) minutes as needed for chest pain.  Marland Kitchen PROAIR HFA 108 (90 Base) MCG/ACT inhaler INHALE 2 PUFFS INTO LUNGS EVERY 6 HOURS AS NEEDED FOR SHORTNESS OF BREATH   Facility-Administered Encounter Medications as of 06/20/2018  Medication  . regadenoson (LEXISCAN) injection SOLN 0.4 mg    Activities of Daily Living In your present state of health, do you have any difficulty performing the following activities: 06/20/2018  Hearing? N  Vision? N  Difficulty concentrating or making decisions? N  Walking or climbing stairs? N  Dressing or bathing? N  Doing errands, shopping? N  Preparing Food and eating ? N  Using the Toilet? N  In the past six months, have you accidently leaked urine? Y  Do you have problems with loss of bowel control? Y  Managing your Medications? N  Managing your Finances? N  Housekeeping or managing your Housekeeping? N  Some recent data might be hidden    Patient Care Team: [name], NP as PCP - General (Geriatric Medicine) Jerline Pain, MD as PCP - Cardiology (Cardiology) Jerline Pain, MD as Consulting Physician (Cardiology) Fleet Contras, MD as Consulting Physician (Nephrology) Rutherford Guys, MD as Consulting Physician (  Ophthalmology) [name], MD as Consulting Physician (Gastroenterology)   Assessment:   This is a routine wellness examination for [place].   Exercise Activities and Dietary recommendations Current Exercise Habits: Home exercise routine, Type of exercise: walking;strength training/weights;treadmill, Time (Minutes): 30, Frequency (Times/Week): 3, Weekly Exercise (Minutes/Week): 90, Intensity: Moderate  Goals    . Increase physical activity     Starting 04/06/16, I will attempt to increase my physical activity by walking more and playing golf more.     . Weight (lb) < 200 lb (90.7 kg)       Fall Risk Fall Risk  06/20/2018 05/29/2018 02/27/2018 11/26/2017 05/08/2017  Falls in the past year? 0 0 0 No No  Number falls in past yr: 0 0 0 - -  Injury with Fall? 0 0 0 - -  Risk for fall due to : - - - - -  Risk for fall due to: Comment - - - - -   Is the patient's home free of loose throw rugs in walkways, pet beds, electrical cords, etc?   yes      Grab bars in the bathroom? no      Handrails on the stairs?   yes      Adequate lighting?   yes  Timed Get Up and Go Performed: na  Depression Screen PHQ 2/9 Scores 06/20/2018 05/29/2018 05/08/2017 01/09/2017  PHQ - 2 Score 0 0 0 0  PHQ- 9 Score - - - -  Exception Documentation - - - -    Cognitive Function MMSE - Mini Mental State Exam 01/09/2017 04/06/2016 12/21/2015 12/01/2014 05/05/2013  Not completed: - (No Data) - - -  Orientation to time 5 - 5 5 4   Orientation to Place 5 - 5 5 5   Registration 3 - 3 3 3   Attention/ Calculation 5 - 5 5 5   Recall 3 - 1 1 2   Language- name 2 objects 2 - 2 2 2   Language- repeat 1 - 1 1 1   Language- follow 3 step command 3 - 3 3 3   Language- read & follow direction 1 - 1 1 1   Write a sentence 1 - 1 1 1   Copy design 1 - 1 1 1   Total score 30 - 28 28 28      6CIT Screen 06/20/2018  What Year? 0 points  What month? 0 points  What time? 0 points  Count back from 20 0 points  Months in reverse 0 points  Repeat phrase 0 points  Total Score 0    Immunization History  Administered Date(s) Administered  . Influenza, High Dose Seasonal PF  11/26/2017  . Influenza,inj,Quad PF,6+ Mos 10/15/2012, 01/05/2014, 12/01/2014, 12/05/2015, 12/11/2016  . Influenza-Unspecified 02/04/2012  . Pneumococcal Conjugate-13 02/09/2014  . Pneumococcal Polysaccharide-23 05/05/2013  . Tdap 03/26/2011  . Zoster Recombinat (Shingrix) 01/25/2017, 03/15/2017    Qualifies for Shingles Vaccine? Yes, done  Screening Tests Health Maintenance  Topic Date Due  . Hepatitis C Screening  02/28/2019 (Originally May 30, 1950)  . OPHTHALMOLOGY EXAM  06/26/2018  . HEMOGLOBIN A1C  08/28/2018  . INFLUENZA VACCINE  09/13/2018  . TETANUS/TDAP  03/25/2021  . COLONOSCOPY  02/14/2026  . PNA vac Low Risk Adult  Completed   Cancer Screenings: Lung: Low Dose CT Chest recommended if Age 80-80 years, 30 pack-year currently smoking OR have quit w/in 15years. Patient does not qualify. Colorectal: up to date  Additional Screenings:  Hepatitis C Screening:done      Plan:  I have personally reviewed and noted the following in the patient's chart:   . Medical and social history . Use of alcohol, tobacco or illicit drugs  . Current medications and supplements . Functional ability and status . Nutritional status . Physical activity . Advanced directives . List of other physicians . Hospitalizations, surgeries, and ER visits in previous 12 months . Vitals . Screenings to include cognitive, depression, and falls . Referrals and appointments  In addition, I have reviewed and discussed with patient certain preventive protocols, quality metrics, and best practice recommendations. A written personalized care plan for preventive services as well as general preventive health recommendations were provided to patient.     [name], NP  06/20/2018

## 2018-06-20 NOTE — Patient Instructions (Signed)
[name] , Thank you for taking time to come for your Medicare Wellness Visit. I appreciate your ongoing commitment to your health goals. Please review the following plan we discussed and let me know if I can assist you in the future.   Screening recommendations/referrals: Colonoscopy up to date Recommended yearly ophthalmology/optometry visit for glaucoma screening and checkup Recommended yearly dental visit for hygiene and checkup  Vaccinations: Influenza vaccine due 09/2018 Pneumococcal vaccine up todate Tdap vaccine up todate Shingles vaccine up date    Advanced directives: have notarized and bring to office for [place] to have on file.   Conditions/risks identified: none  CONGRATULATIONS on weight loss.   Next appointment: 1 year.   Preventive Care 70 Years and Older, Male Preventive care refers to lifestyle choices and visits with your health care provider that can promote health and wellness. What does preventive care include?  A yearly physical exam. This is also called an annual well check.  Dental exams once or twice a year.  Routine eye exams. Ask your health care provider how often you should have your eyes checked.  Personal lifestyle choices, including:  Daily care of your teeth and gums.  Regular physical activity.  Eating a healthy diet.  Avoiding tobacco and drug use.  Limiting alcohol use.  Practicing safe sex.  Taking low doses of aspirin every day.  Taking vitamin and mineral supplements as recommended by your health care provider. What happens during an annual well check? The services and screenings done by your health care provider during your annual well check will depend on your age, overall health, lifestyle risk factors, and family history of disease. Counseling  Your health care provider may ask you questions about your:  Alcohol use.  Tobacco use.  Drug use.  Emotional well-being.  Home and relationship well-being.  Sexual  activity.  Eating habits.  History of falls.  Memory and ability to understand (cognition).  Work and work Statistician. Screening  You may have the following tests or measurements:  Height, weight, and BMI.  Blood pressure.  Lipid and cholesterol levels. These may be checked every 5 years, or more frequently if you are over 70 years old.  Skin check.  Lung cancer screening. You may have this screening every year starting at age 70 if you have a 30-pack-year history of smoking and currently smoke or have quit within the past 15 years.  Fecal occult blood test (FOBT) of the stool. You may have this test every year starting at age 70.  Flexible sigmoidoscopy or colonoscopy. You may have a sigmoidoscopy every 5 years or a colonoscopy every 10 years starting at age 70.  Prostate cancer screening. Recommendations will vary depending on your family history and other risks.  Hepatitis C blood test.  Hepatitis B blood test.  Sexually transmitted disease (STD) testing.  Diabetes screening. This is done by checking your blood sugar (glucose) after you have not eaten for a while (fasting). You may have this done every 1-3 years.  Abdominal aortic aneurysm (AAA) screening. You may need this if you are a current or former smoker.  Osteoporosis. You may be screened starting at age 70 if you are at high risk. Talk with your health care provider about your test results, treatment options, and if necessary, the need for more tests. Vaccines  Your health care provider may recommend certain vaccines, such as:  Influenza vaccine. This is recommended every year.  Tetanus, diphtheria, and acellular pertussis (Tdap, Td) vaccine. You  may need a Td booster every 10 years.  Zoster vaccine. You may need this after age 75.  Pneumococcal 13-valent conjugate (PCV13) vaccine. One dose is recommended after age 58.  Pneumococcal polysaccharide (PPSV23) vaccine. One dose is recommended after age 20.  Talk to your health care provider about which screenings and vaccines you need and how often you need them. This information is not intended to replace advice given to you by your health care provider. Make sure you discuss any questions you have with your health care provider. Document Released: 02/25/2015 Document Revised: 10/19/2015 Document Reviewed: 11/30/2014 Elsevier Interactive Patient Education  2017 [employer] Prevention in the Home Falls can cause injuries. They can happen to people of all ages. There are many things you can do to make your home safe and to help prevent falls. What can I do on the outside of my home?  Regularly fix the edges of walkways and driveways and fix any cracks.  Remove anything that might make you trip as you walk through a door, such as a raised step or threshold.  Trim any bushes or trees on the path to your home.  Use bright outdoor lighting.  Clear any walking paths of anything that might make someone trip, such as rocks or tools.  Regularly check to see if handrails are loose or broken. Make sure that both sides of any steps have handrails.  Any raised decks and porches should have guardrails on the edges.  Have any leaves, snow, or ice cleared regularly.  Use sand or salt on walking paths during winter.  Clean up any spills in your garage right away. This includes oil or grease spills. What can I do in the bathroom?  Use night lights.  Install grab bars by the toilet and in the tub and shower. Do not use towel bars as grab bars.  Use non-skid mats or decals in the tub or shower.  If you need to sit down in the shower, use a plastic, non-slip stool.  Keep the floor dry. Clean up any water that spills on the floor as soon as it happens.  Remove soap buildup in the tub or shower regularly.  Attach bath mats securely with double-sided non-slip rug tape.  Do not have throw rugs and other things on the floor that can make you  trip. What can I do in the bedroom?  Use night lights.  Make sure that you have a light by your bed that is easy to reach.  Do not use any sheets or blankets that are too big for your bed. They should not hang down onto the floor.  Have a firm chair that has side arms. You can use this for support while you get dressed.  Do not have throw rugs and other things on the floor that can make you trip. What can I do in the kitchen?  Clean up any spills right away.  Avoid walking on wet floors.  Keep items that you use a lot in easy-to-reach places.  If you need to reach something above you, use a strong step stool that has a grab bar.  Keep electrical cords out of the way.  Do not use floor polish or wax that makes floors slippery. If you must use wax, use non-skid floor wax.  Do not have throw rugs and other things on the floor that can make you trip. What can I do with my stairs?  Do not leave any items  on the stairs.  Make sure that there are handrails on both sides of the stairs and use them. Fix handrails that are broken or loose. Make sure that handrails are as long as the stairways.  Check any carpeting to make sure that it is firmly attached to the stairs. Fix any carpet that is loose or worn.  Avoid having throw rugs at the top or bottom of the stairs. If you do have throw rugs, attach them to the floor with carpet tape.  Make sure that you have a light switch at the top of the stairs and the bottom of the stairs. If you do not have them, ask someone to add them for you. What else can I do to help prevent falls?  Wear shoes that:  Do not have high heels.  Have rubber bottoms.  Are comfortable and fit you well.  Are closed at the toe. Do not wear sandals.  If you use a stepladder:  Make sure that it is fully opened. Do not climb a closed stepladder.  Make sure that both sides of the stepladder are locked into place.  Ask someone to hold it for you, if  possible.  Clearly mark and make sure that you can see:  Any grab bars or handrails.  First and last steps.  Where the edge of each step is.  Use tools that help you move around (mobility aids) if they are needed. These include:  Canes.  Walkers.  Scooters.  Crutches.  Turn on the lights when you go into a dark area. Replace any light bulbs as soon as they burn out.  Set up your furniture so you have a clear path. Avoid moving your furniture around.  If any of your floors are uneven, fix them.  If there are any pets around you, be aware of where they are.  Review your medicines with your doctor. Some medicines can make you feel dizzy. This can increase your chance of falling. Ask your doctor what other things that you can do to help prevent falls. This information is not intended to replace advice given to you by your health care provider. Make sure you discuss any questions you have with your health care provider. Document Released: 11/25/2008 Document Revised: 07/07/2015 Document Reviewed: 03/05/2014 Elsevier Interactive Patient Education  2017 [employer].

## 2018-06-21 ENCOUNTER — Other Ambulatory Visit: Payer: Self-pay | Admitting: Cardiology

## 2018-06-23 ENCOUNTER — Other Ambulatory Visit: Payer: Self-pay | Admitting: *Deleted

## 2018-06-23 DIAGNOSIS — J4521 Mild intermittent asthma with (acute) exacerbation: Secondary | ICD-10-CM

## 2018-06-23 MED ORDER — AMLODIPINE BESYLATE 10 MG PO TABS: 10.0000 mg | ORAL_TABLET | Freq: Every day | ORAL | 1 refills | Status: DC

## 2018-06-23 MED ORDER — MONTELUKAST SODIUM 10 MG PO TABS: 10.0000 mg | ORAL_TABLET | Freq: Every day | ORAL | 1 refills | Status: DC

## 2018-06-23 NOTE — Telephone Encounter (Signed)
[facility].  

## 2018-07-03 ENCOUNTER — Telehealth: Payer: Self-pay

## 2018-07-03 NOTE — Telephone Encounter (Signed)
YOUR CARDIOLOGY TEAM HAS ARRANGED FOR AN E-VISIT FOR YOUR APPOINTMENT - PLEASE REVIEW IMPORTANT INFORMATION BELOW SEVERAL DAYS PRIOR TO YOUR APPOINTMENT  Due to the recent COVID-19 pandemic, we are transitioning in-person office visits to tele-medicine visits in an effort to decrease unnecessary exposure to our patients, their families, and staff. These visits are billed to your insurance just like a normal visit is. We also encourage you to sign up for MyChart if you have not already done so. You will need a smartphone if possible. For patients that do not have this, we can still complete the visit using a regular telephone but do prefer a smartphone to enable video when possible. You may have a family member that lives with you that can help. If possible, we also ask that you have a blood pressure cuff and scale at home to measure your blood pressure, heart rate and weight prior to your scheduled appointment. Patients with clinical needs that need an in-person evaluation and testing will still be able to come to the office if absolutely necessary. If you have any questions, feel free to call our office.     YOUR PROVIDER WILL BE USING THE FOLLOWING PLATFORM TO COMPLETE YOUR VISIT: Doxy.Me  . IF USING MYCHART - How to Download the MyChart App to Your SmartPhone   - If Apple, go to App Store and type in MyChart in the search bar and download the app. If Android, ask patient to go to Google Play Store and type in MyChart in the search bar and download the app. The app is free but as with any other app downloads, your phone may require you to verify saved payment information or Apple/Android password.  - You will need to then log into the app with your MyChart username and password, and select [facility] as your healthcare provider to link the account.  - When it is time for your visit, go to the MyChart app, find appointments, and click Begin Video Visit. Be sure to Select Allow for your device to  access the Microphone and Camera for your visit. You will then be connected, and your provider will be with you shortly.  **If you have any issues connecting or need assistance, please contact MyChart service desk [phone] ([phone])**  **If using a computer, in order to ensure the best quality for your visit, you will need to use either of the following Internet Browsers: Google Chrome or Microsoft Edge**  . IF USING DOXIMITY or DOXY.ME - The staff will give you instructions on receiving your link to join the meeting the day of your visit.      2-3 DAYS BEFORE YOUR APPOINTMENT  You will receive a telephone call from one of our HeartCare team members - your caller ID may say "Unknown caller." If this is a video visit, we will walk you through how to get the video launched on your phone. We will remind you check your blood pressure, heart rate and weight prior to your scheduled appointment. If you have an Apple Watch or Kardia, please upload any pertinent ECG strips the day before or morning of your appointment to MyChart. Our staff will also make sure you have reviewed the consent and agree to move forward with your scheduled tele-health visit.     THE DAY OF YOUR APPOINTMENT  Approximately 15 minutes prior to your scheduled appointment, you will receive a telephone call from one of HeartCare team - your caller ID may say "Unknown caller."    Our staff will confirm medications, vital signs for the day and any symptoms you may be experiencing. Please have this information available prior to the time of visit start. It may also be helpful for you to have a pad of paper and pen handy for any instructions given during your visit. They will also walk you through joining the smartphone meeting if this is a video visit.    CONSENT FOR TELE-HEALTH VISIT - PLEASE REVIEW  I hereby voluntarily request, consent and authorize [facility] and its employed or contracted physicians, physician  assistants, nurse practitioners or other licensed health care professionals (the Practitioner), to provide me with telemedicine health care services (the "Services") as deemed necessary by the treating Practitioner. I acknowledge and consent to receive the Services by the Practitioner via telemedicine. I understand that the telemedicine visit will involve communicating with the Practitioner through live audiovisual communication technology and the disclosure of certain medical information by electronic transmission. I acknowledge that I have been given the opportunity to request an in-person assessment or other available alternative prior to the telemedicine visit and am voluntarily participating in the telemedicine visit.  I understand that I have the right to withhold or withdraw my consent to the use of telemedicine in the course of my care at any time, without affecting my right to future care or treatment, and that the Practitioner or I may terminate the telemedicine visit at any time. I understand that I have the right to inspect all information obtained and/or recorded in the course of the telemedicine visit and may receive copies of available information for a reasonable fee.  I understand that some of the potential risks of receiving the Services via telemedicine include:  . Delay or interruption in medical evaluation due to technological equipment failure or disruption; . Information transmitted may not be sufficient (e.g. poor resolution of images) to allow for appropriate medical decision making by the Practitioner; and/or  . In rare instances, security protocols could fail, causing a breach of personal health information.  Furthermore, I acknowledge that it is my responsibility to provide information about my medical history, conditions and care that is complete and accurate to the best of my ability. I acknowledge that Practitioner's advice, recommendations, and/or decision may be based on  factors not within their control, such as incomplete or inaccurate data provided by me or distortions of diagnostic images or specimens that may result from electronic transmissions. I understand that the practice of medicine is not an exact science and that Practitioner makes no warranties or guarantees regarding treatment outcomes. I acknowledge that I will receive a copy of this consent concurrently upon execution via email to the email address I last provided but may also request a printed copy by calling the office of [facility].    I understand that my insurance will be billed for this visit.   I have read or had this consent read to me. . I understand the contents of this consent, which adequately explains the benefits and risks of the Services being provided via telemedicine.  . I have been provided ample opportunity to ask questions regarding this consent and the Services and have had my questions answered to my satisfaction. . I give my informed consent for the services to be provided through the use of telemedicine in my medical care  By participating in this telemedicine visit I agree to the above.  

## 2018-07-08 ENCOUNTER — Other Ambulatory Visit: Payer: Self-pay

## 2018-07-08 ENCOUNTER — Telehealth (INDEPENDENT_AMBULATORY_CARE_PROVIDER_SITE_OTHER): Payer: Medicare Other | Admitting: Cardiology

## 2018-07-08 ENCOUNTER — Encounter: Payer: Self-pay | Admitting: Cardiology

## 2018-07-08 VITALS — BP 125/79 | HR 73 | Temp 97.9°F | Ht 73.5 in | Wt 218.0 lb

## 2018-07-08 DIAGNOSIS — I5042 Chronic combined systolic (congestive) and diastolic (congestive) heart failure: Secondary | ICD-10-CM

## 2018-07-08 DIAGNOSIS — I255 Ischemic cardiomyopathy: Secondary | ICD-10-CM | POA: Diagnosis not present

## 2018-07-08 DIAGNOSIS — G4733 Obstructive sleep apnea (adult) (pediatric): Secondary | ICD-10-CM

## 2018-07-08 DIAGNOSIS — N183 Chronic kidney disease, stage 3 unspecified: Secondary | ICD-10-CM

## 2018-07-08 DIAGNOSIS — Z01810 Encounter for preprocedural cardiovascular examination: Secondary | ICD-10-CM

## 2018-07-08 DIAGNOSIS — Z951 Presence of aortocoronary bypass graft: Secondary | ICD-10-CM

## 2018-07-08 DIAGNOSIS — I10 Essential (primary) hypertension: Secondary | ICD-10-CM

## 2018-07-08 DIAGNOSIS — E119 Type 2 diabetes mellitus without complications: Secondary | ICD-10-CM

## 2018-07-08 NOTE — Progress Notes (Signed)
Virtual Visit via Video Note   This visit type was conducted due to national recommendations for restrictions regarding the COVID-19 Pandemic (e.g. social distancing) in an effort to limit this patient's exposure and mitigate transmission in our community.  Due to his co-morbid illnesses, this patient is at least at moderate risk for complications without adequate follow up.  This format is felt to be most appropriate for this patient at this time.  All issues noted in this document were discussed and addressed.  A limited physical exam was performed with this format.  Please refer to the patient's chart for his consent to telehealth for [facility].   Date:  07/08/2018   ID:  Henry Carroll, DOB 11-Feb-1951, MRN [id number]  Patient Location: Home Provider Location: Home  PCP:  [name], NP  Cardiologist:  Candee Furbish, MD  Electrophysiologist:  None   Evaluation Performed:  Follow-Up Visit  Chief Complaint: Follow-up cardiomyopathy  History of Present Illness:    Henry Carroll is a 68 y.o. male with CAD post CABG in 2017 with diabetes with hypertension hyperlipidemia chronic kidney disease here for follow-up.  Prior ejection fraction in the 40 to 45% range.  Last echo in February 2020 showed normal EF 50 to 55%.  Previously was having some orthopnea type symptoms.  Cough.  Chest x-ray from 04/01/2018 showed right middle lobe collapse with bronchitis type changes. Zpac helped. Went away almost immediately.  Overall he is feeling great right now.  Walking frequently, no chest pain, no shortness of breath.  No orthopnea.  Best he is felt in a while.  Dr. Posey Pronto - talked to [facility] transplant program. Wanted an update echocardiogram in July.   Treadmill 3 days a week. Intermittent fasting. Bet with nephew in [place] about weight loss.   The patient does not have symptoms concerning for COVID-19 infection (fever, chills, cough, or new shortness of breath).    Past  Medical History:  Diagnosis Date  . Anemia   . 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 Asthma   . Chronic combined systolic (congestive) and diastolic (congestive) heart failure ([place])    a. 12/31/14: 2D ECHO: EF 40-45%, HK of inf myocardium, G1DD, mod MR  . CKD (chronic kidney disease), stage III (HCC)    stage 3 kidney disease  . Coronary artery disease    a. LHC 01/2015 - triple vessel CAD (mod oLM, mLAD, severe mRCA, intermediate branch stenosis, CTO of mCx). Plan CABG 02/2015.  Marland Kitchen GERD (gastroesophageal reflux disease)   . Hyperlipidemia   . Hypertension   . Mitral regurgitation    a. Mild-mod by echo 12/2014.  Marland Kitchen Myocardial infarction [facility])    pt. states per [name] he has in the past  . NSVT (nonsustained ventricular tachycardia) ([place])    a. 9 beats during 01/2015 adm. BB titrated.  . Obesity    a. BMI 33  . OSA (obstructive sleep apnea) 01/16/2018   Mild obstructive sleep apnea overall with an AHI of 7.3/h and no significant central sleep apnea. Severe obstructive sleep apnea during REM sleep with an AHI of 34.3/h.  Now on CPAP at 6 cm H2O.   . Sleep apnea    2019, Dr.Turner diagnoised   . Type II diabetes mellitus ([place])    Past Surgical History:  Procedure Laterality Date  . BACK SURGERY    . CARDIAC CATHETERIZATION N/A 02/09/2015   Procedure: Left Heart Cath and Coronary Angiography;  Surgeon: Harrell Gave  Santina Evans, MD;  Location: [facility];  Service: Cardiovascular;  Laterality: N/A;  . COLONOSCOPY  2011   Dr. [name]: Ileocecal valve appeared normal, scattered pancolonic diverticulosis, difficult bowel prep making smaller lesions potentially missed. Recommended three-year follow-up colonoscopy.  . COLONOSCOPY  2003   Dr. [name]: Suspicious lesion at the ileocecal valve, multiple biopsies benign, pancolonic diverticulosis.  . COLONOSCOPY N/A 02/15/2016   diverticulosis in sigmoid and descending colon, single 5 mm polyp at splenic flexure (Tubular adenoma)  . CORONARY  ARTERY BYPASS GRAFT N/A 02/18/2015   Procedure: CORONARY ARTERY BYPASS GRAFTING (CABG) x  four, using bilateral internal mammary arteries and right leg greater saphenous vein harvested endoscopically;  Surgeon: [name], MD;  Location: [facility] OR;  Service: Open Heart Surgery;  Laterality: N/A;  . ESOPHAGOGASTRODUODENOSCOPY N/A 02/15/2016   normal  . EYE SURGERY Right    July 2019   . GIVENS CAPSULE STUDY N/A 01/08/2017   couple of gastric and small bowel eroions in setting of aspirin 81 mg daily but nothing concerning, continue Hematology follow-up  . HERNIA REPAIR  3825   Umbilical  . LUMBAR Welcome SURGERY  2006   L4 & L5  . REFRACTIVE SURGERY Left    2019   . REFRACTIVE SURGERY Right    2019  . TEE WITHOUT CARDIOVERSION N/A 02/18/2015   Procedure: TRANSESOPHAGEAL ECHOCARDIOGRAM (TEE);  Surgeon: [name], MD;  Location: [facility];  Service: Open Heart Surgery;  Laterality: N/A;  . TONSILLECTOMY  1962     Current Meds  Medication Sig  . acetaminophen (TYLENOL) 500 MG tablet Take 1,000 mg by mouth 2 (two) times daily as needed for moderate pain.  Marland Kitchen albuterol (PROVENTIL) (2.5 MG/3ML) 0.083% nebulizer solution USE ONE VIAL IN NEBULIZER EVERY 6 HOURS AS NEEDED FOR WHEEZING FOR SHORTNESS OF BREATH  . amLODipine (NORVASC) 10 MG tablet Take 1 tablet (10 mg total) by mouth daily.  Marland Kitchen aspirin EC 81 MG tablet Take 1 tablet (81 mg total) by mouth daily.  Marland Kitchen atorvastatin (LIPITOR) 40 MG tablet TAKE 1 TABLET BY MOUTH EVERY EVENING  . carvedilol (COREG) 25 MG tablet Take 25 mg by mouth 2 (two) times daily with a meal.  . Cholecalciferol ([employer]S VITAMIN D3) 1000 UNITS capsule Take 2 capsules (2,000 Units total) by mouth daily.  Marland Kitchen CINNAMON PO Take 1,000 mg by mouth 2 (two) times daily.   . Coenzyme Q10 (CO Q 10 PO) Take 200 mg by mouth daily.   . fluticasone (FLONASE) 50 MCG/ACT nasal spray Place 2 sprays into both nostrils daily as needed for allergies or rhinitis (SEASONAL ALLERGIES).  .  Fluticasone-Salmeterol (ADVAIR DISKUS) 100-50 MCG/DOSE AEPB Inhale 1 puff into the lungs 2 (two) times daily.  . furosemide (LASIX) 40 MG tablet TAKE 1 & 1/2 (ONE & ONE-HALF) TABLETS BY MOUTH TWICE DAILY  . glucose blood (ONE TOUCH ULTRA TEST) test strip Use to test blood sugar three times daily. Dx: E11.21  . hydrALAZINE (APRESOLINE) 100 MG tablet Take 100 mg by mouth 2 (two) times daily.  . insulin detemir (LEVEMIR) 100 UNIT/ML injection Inject 40 Units into the skin daily.  . isosorbide mononitrate (IMDUR) 30 MG 24 hr tablet Take 1 tablet (30 mg total) by mouth daily.  . montelukast (SINGULAIR) 10 MG tablet Take 1 tablet (10 mg total) by mouth at bedtime.  . Multiple Vitamins-Minerals (ONE-A-DAY MENS 50+ ADVANTAGE) TABS Take 1 tablet by mouth daily.  . nitroGLYCERIN (NITROSTAT) 0.4 MG SL tablet Place 1  tablet (0.4 mg total) under the tongue every 5 (five) minutes as needed for chest pain.  Marland Kitchen PROAIR HFA 108 (90 Base) MCG/ACT inhaler INHALE 2 PUFFS INTO LUNGS EVERY 6 HOURS AS NEEDED FOR SHORTNESS OF BREATH     Allergies:   Patient has no known allergies.   Social History   Tobacco Use  . Smoking status: Former Smoker    Years: 17.00    Types: Cigars    Last attempt to quit: 12/31/2014    Years since quitting: 3.5  . Smokeless tobacco: Never Used  Substance Use Topics  . Alcohol use: Yes    Alcohol/week: 0.0 standard drinks    Comment: occasional glass of wine.  . Drug use: No     Family Hx: The patient's family history includes Colon cancer in his paternal uncle; Diabetes in his mother; Heart disease in his father and mother; Hypertension in his father; Kidney disease in his daughter; Stroke in his sister; Sudden death in his daughter. There is no history of Heart attack.  ROS:   Please see the history of present illness.    No fevers chills nausea vomiting syncope orthopnea PND All other systems reviewed and are negative.   Prior CV studies:   The following studies were  reviewed today:  ECHO 04/04/2018:   1. The left ventricle has low normal systolic function, with an ejection fraction of 50-55%. The cavity size was normal. There is moderately increased left ventricular wall thickness. Left ventricular diastolic Doppler parameters are consistent with  impaired relaxation.  2. The right ventricle has normal systolic function. The cavity was normal. There is no increase in right ventricular wall thickness.  3. Left atrial size was mildly dilated.  4. Right atrial size was mildly dilated.  5. The mitral valve is normal in structure. Moderate thickening of the mitral valve leaflet. No evidence of mitral valve stenosis, with a calculated valve area of 2.08 cm.  6. The tricuspid valve is normal in structure.  7. The aortic valve is tricuspid Moderate thickening of the aortic valve Aortic valve regurgitation is mild by color flow Doppler. no stenosis of the aortic valve.  8. The aortic root is normal in size and structure.  9. Pulmonary hypertension is indeterminant, inadequate TR jet.   Labs/Other Tests and Data Reviewed:    EKG:  An ECG dated 11/19/2017 was personally reviewed today and demonstrated:  Sinus rhythm nonspecific ST-T wave changes  Recent Labs: 04/01/2018: ALT 16; BUN 74; Creatinine, Ser 4.85; Hemoglobin 10.0; NT-Pro BNP 2,186; Platelets 290; Potassium 4.4; Sodium 143   Recent Lipid Panel Lab Results  Component Value Date/Time   CHOL 180 02/27/2018 01:58 PM   CHOL 149 06/01/2015 03:00 PM   TRIG 132 02/27/2018 01:58 PM   HDL 42 02/27/2018 01:58 PM   HDL 45 06/01/2015 03:00 PM   CHOLHDL 4.3 02/27/2018 01:58 PM   LDLCALC 113 (H) 02/27/2018 01:58 PM    Wt Readings from Last 3 Encounters:  07/08/18 218 lb (98.9 kg)  04/01/18 226 lb 12.8 oz (102.9 kg)  02/27/18 240 lb (108.9 kg)     Objective:    Vital Signs:  BP 125/79   Pulse 73   Temp 97.9 F (36.6 C)   Ht 6' 1.5" (1.867 m)   Wt 218 lb (98.9 kg)   BMI 28.37 kg/m    VITAL  SIGNS:  reviewed GEN:  no acute distress EYES:  sclerae anicteric, EOMI - Extraocular Movements Intact RESPIRATORY:  normal respiratory  effort, symmetric expansion SKIN:  no rash, lesions or ulcers. MUSCULOSKELETAL:  no obvious deformities. NEURO:  alert and oriented x 3, no obvious focal deficit PSYCH:  normal affect  ASSESSMENT & PLAN:    Coronary artery disease status post CABG - Continue with aggressive secondary prevention. Great job with weight loss, treadmill, fasting.   Chronic systolic heart failure - Entresto had to be stopped because of continued reduction in renal function, marked improve EF, 55% previously.  At the request of [facility] kidney transplant center, we will order another echocardiogram in July for him. - In place of Entresto, he is on isosorbide and hydralazine.  Diabetes with hypertension and chronic kidney disease stage IV/V -Continue to control.  Doing a good job.  Weight loss has been excellent. -Dr. Posey Pronto has been monitoring closely.  Had him evaluated for renal transplant.  Initially told that because of his heart condition, echocardiogram, he would not likely be a candidate but they requested a repeat echocardiogram in July 2020 and will reevaluate.  COVID-19 Education: The signs and symptoms of COVID-19 were discussed with the patient and how to seek care for testing (follow up with PCP or arrange E-visit).  The importance of social distancing was discussed today.  Time:   Today, I have spent 14 minutes with the patient with telehealth technology discussing the above problems.     Medication Adjustments/Labs and Tests Ordered: Current medicines are reviewed at length with the patient today.  Concerns regarding medicines are outlined above.   Tests Ordered: Orders Placed This Encounter  Procedures  . ECHOCARDIOGRAM COMPLETE    Medication Changes: No orders of the defined types were placed in this encounter.   Disposition:  Follow up in 6  month(s)  Signed, Candee Furbish, MD  07/08/2018 10:12 AM    [facility]

## 2018-07-08 NOTE — Patient Instructions (Signed)
Medication Instructions:  The current medical regimen is effective;  continue present plan and medications.  If you need a refill on your cardiac medications before your next appointment, please call your pharmacy.   Testing/Procedures: Your physician has requested that you have an echocardiogram in July 2020. Echocardiography is a painless test that uses sound waves to create images of your heart. It provides your doctor with information about the size and shape of your heart and how well your heart's chambers and valves are working. This procedure takes approximately one hour. There are no restrictions for this procedure. You will be contacted to be scheduled for this testing.  Follow-Up: At [facility], you and your health needs are our priority.  As part of our continuing mission to provide you with exceptional heart care, we have created designated Provider Care Teams.  These Care Teams include your primary Cardiologist (physician) and Advanced Practice Providers (APPs -  Physician Assistants and Nurse Practitioners) who all work together to provide you with the care you need, when you need it. You will need a follow up appointment in 6 months with Truitt Merle, NP and Dr Marlou Porch in 1 year.  Please call our office 2 months in advance to schedule this appointment.  You may see Candee Furbish, MD or one of the following Advanced Practice Providers on your designated Care Team:   Truitt Merle, NP Cecilie Kicks, NP . Kathyrn Drown, NP  Thank you for choosing [facility]!!

## 2018-08-01 ENCOUNTER — Other Ambulatory Visit: Payer: Self-pay | Admitting: *Deleted

## 2018-08-01 MED ORDER — PROAIR HFA 108 (90 BASE) MCG/ACT IN AERS: INHALATION_SPRAY | RESPIRATORY_TRACT | 1 refills | Status: DC

## 2018-08-01 NOTE — Telephone Encounter (Signed)
Received refill Request Pended and sent to [facility] for approval due to [place].

## 2018-08-04 ENCOUNTER — Other Ambulatory Visit: Payer: Self-pay | Admitting: *Deleted

## 2018-08-04 MED ORDER — ALBUTEROL SULFATE (2.5 MG/3ML) 0.083% IN NEBU: INHALATION_SOLUTION | RESPIRATORY_TRACT | 3 refills | Status: DC

## 2018-08-04 NOTE — Telephone Encounter (Signed)
[facility] Pended Rx and sent to [place] for approval due to [place].

## 2018-08-22 ENCOUNTER — Encounter (HOSPITAL_COMMUNITY): Payer: Self-pay | Admitting: Cardiology

## 2018-08-28 ENCOUNTER — Other Ambulatory Visit: Payer: Self-pay

## 2018-08-28 ENCOUNTER — Other Ambulatory Visit: Payer: Medicare Other

## 2018-08-28 DIAGNOSIS — E1165 Type 2 diabetes mellitus with hyperglycemia: Secondary | ICD-10-CM | POA: Diagnosis not present

## 2018-08-28 DIAGNOSIS — E1121 Type 2 diabetes mellitus with diabetic nephropathy: Secondary | ICD-10-CM | POA: Diagnosis not present

## 2018-08-28 DIAGNOSIS — E1169 Type 2 diabetes mellitus with other specified complication: Secondary | ICD-10-CM

## 2018-08-28 DIAGNOSIS — N184 Chronic kidney disease, stage 4 (severe): Secondary | ICD-10-CM | POA: Diagnosis not present

## 2018-08-28 DIAGNOSIS — E785 Hyperlipidemia, unspecified: Secondary | ICD-10-CM | POA: Diagnosis not present

## 2018-08-28 DIAGNOSIS — IMO0002 Reserved for concepts with insufficient information to code with codable children: Secondary | ICD-10-CM

## 2018-08-28 DIAGNOSIS — D631 Anemia in chronic kidney disease: Secondary | ICD-10-CM | POA: Diagnosis not present

## 2018-08-29 ENCOUNTER — Observation Stay (HOSPITAL_COMMUNITY)
Admission: EM | Admit: 2018-08-29 | Discharge: 2018-08-30 | Disposition: A | Discharge status: Home / Self Care | Payer: Medicare Other | Attending: Internal Medicine | Admitting: Internal Medicine

## 2018-08-29 ENCOUNTER — Emergency Department (HOSPITAL_COMMUNITY): Payer: Medicare Other

## 2018-08-29 ENCOUNTER — Encounter (HOSPITAL_COMMUNITY): Payer: Self-pay | Admitting: *Deleted

## 2018-08-29 ENCOUNTER — Other Ambulatory Visit: Payer: Self-pay

## 2018-08-29 DIAGNOSIS — D509 Iron deficiency anemia, unspecified: Secondary | ICD-10-CM | POA: Diagnosis present

## 2018-08-29 DIAGNOSIS — D649 Anemia, unspecified: Secondary | ICD-10-CM | POA: Diagnosis present

## 2018-08-29 DIAGNOSIS — Z20828 Contact with and (suspected) exposure to other viral communicable diseases: Secondary | ICD-10-CM | POA: Insufficient documentation

## 2018-08-29 DIAGNOSIS — E119 Type 2 diabetes mellitus without complications: Secondary | ICD-10-CM | POA: Insufficient documentation

## 2018-08-29 DIAGNOSIS — R7989 Other specified abnormal findings of blood chemistry: Secondary | ICD-10-CM | POA: Diagnosis present

## 2018-08-29 DIAGNOSIS — J45909 Unspecified asthma, uncomplicated: Secondary | ICD-10-CM | POA: Diagnosis not present

## 2018-08-29 DIAGNOSIS — Z7982 Long term (current) use of aspirin: Secondary | ICD-10-CM | POA: Insufficient documentation

## 2018-08-29 DIAGNOSIS — Z87891 Personal history of nicotine dependence: Secondary | ICD-10-CM | POA: Insufficient documentation

## 2018-08-29 DIAGNOSIS — I110 Hypertensive heart disease with heart failure: Secondary | ICD-10-CM | POA: Diagnosis present

## 2018-08-29 DIAGNOSIS — Z79899 Other long term (current) drug therapy: Secondary | ICD-10-CM | POA: Diagnosis not present

## 2018-08-29 DIAGNOSIS — E1169 Type 2 diabetes mellitus with other specified complication: Secondary | ICD-10-CM | POA: Diagnosis not present

## 2018-08-29 DIAGNOSIS — I5022 Chronic systolic (congestive) heart failure: Secondary | ICD-10-CM | POA: Diagnosis present

## 2018-08-29 DIAGNOSIS — E1129 Type 2 diabetes mellitus with other diabetic kidney complication: Secondary | ICD-10-CM | POA: Diagnosis present

## 2018-08-29 DIAGNOSIS — I132 Hypertensive heart and chronic kidney disease with heart failure and with stage 5 chronic kidney disease, or end stage renal disease: Secondary | ICD-10-CM | POA: Diagnosis not present

## 2018-08-29 DIAGNOSIS — I10 Essential (primary) hypertension: Secondary | ICD-10-CM | POA: Diagnosis present

## 2018-08-29 DIAGNOSIS — N185 Chronic kidney disease, stage 5: Secondary | ICD-10-CM | POA: Diagnosis present

## 2018-08-29 DIAGNOSIS — I2510 Atherosclerotic heart disease of native coronary artery without angina pectoris: Secondary | ICD-10-CM | POA: Insufficient documentation

## 2018-08-29 DIAGNOSIS — D472 Monoclonal gammopathy: Secondary | ICD-10-CM | POA: Diagnosis not present

## 2018-08-29 DIAGNOSIS — IMO0002 Reserved for concepts with insufficient information to code with codable children: Secondary | ICD-10-CM | POA: Diagnosis present

## 2018-08-29 DIAGNOSIS — R5383 Other fatigue: Secondary | ICD-10-CM | POA: Diagnosis not present

## 2018-08-29 HISTORY — DX: Anemia, unspecified: D64.9

## 2018-08-29 LAB — CBC WITH DIFFERENTIAL/PLATELET
Abs Immature Granulocytes: 0 10*3/uL (ref 0.00–0.07)
Basophils Absolute: 0 10*3/uL (ref 0.0–0.1)
Basophils Relative: 0 %
Eosinophils Absolute: 0.1 10*3/uL (ref 0.0–0.5)
Eosinophils Relative: 3 %
HCT: 24.1 % — ABNORMAL LOW (ref 39.0–52.0)
Hemoglobin: 8.1 g/dL — ABNORMAL LOW (ref 13.0–17.0)
Immature Granulocytes: 0 %
Lymphocytes Relative: 20 %
Lymphs Abs: 0.9 10*3/uL (ref 0.7–4.0)
MCH: 32.1 pg (ref 26.0–34.0)
MCHC: 33.6 g/dL (ref 30.0–36.0)
MCV: 95.6 fL (ref 80.0–100.0)
Monocytes Absolute: 0.6 10*3/uL (ref 0.1–1.0)
Monocytes Relative: 13 %
Neutro Abs: 2.9 10*3/uL (ref 1.7–7.7)
Neutrophils Relative %: 64 %
Platelets: 223 10*3/uL (ref 150–400)
RBC: 2.52 MIL/uL — ABNORMAL LOW (ref 4.22–5.81)
RDW: 12.4 % (ref 11.5–15.5)
WBC: 4.6 10*3/uL (ref 4.0–10.5)
nRBC: 0 % (ref 0.0–0.2)

## 2018-08-29 LAB — LIPID PANEL
Cholesterol: 151 mg/dL (ref <200)
HDL: 51 mg/dL (ref >=40)
LDL Cholesterol (Calc): 80 mg/dL (calc)
Non-HDL Cholesterol (Calc): 100 mg/dL (calc) (ref <130)
Total CHOL/HDL Ratio: 3 (calc) (ref <5.0)
Triglycerides: 104 mg/dL (ref <150)

## 2018-08-29 LAB — COMPREHENSIVE METABOLIC PANEL
ALT: 19 U/L (ref 0–44)
AST: 27 U/L (ref 15–41)
Albumin: 3.5 g/dL (ref 3.5–5.0)
Alkaline Phosphatase: 43 U/L (ref 38–126)
Anion gap: 10 (ref 5–15)
BUN: 87 mg/dL — ABNORMAL HIGH (ref 8–23)
CO2: 24 mmol/L (ref 22–32)
Calcium: 9.2 mg/dL (ref 8.9–10.3)
Chloride: 105 mmol/L (ref 98–111)
Creatinine, Ser: 5.73 mg/dL — ABNORMAL HIGH (ref 0.61–1.24)
GFR calc Af Amer: 11 mL/min — ABNORMAL LOW (ref >60)
GFR calc non Af Amer: 9 mL/min — ABNORMAL LOW (ref >60)
Glucose, Bld: 82 mg/dL (ref 70–99)
Potassium: 4.3 mmol/L (ref 3.5–5.1)
Sodium: 139 mmol/L (ref 135–145)
Total Bilirubin: 0.6 mg/dL (ref 0.3–1.2)
Total Protein: 6.4 g/dL — ABNORMAL LOW (ref 6.5–8.1)

## 2018-08-29 LAB — URINALYSIS, ROUTINE W REFLEX MICROSCOPIC
Bilirubin Urine: NEGATIVE
Glucose, UA: NEGATIVE mg/dL
Hgb urine dipstick: NEGATIVE
Ketones, ur: NEGATIVE mg/dL
Leukocytes,Ua: NEGATIVE
Nitrite: NEGATIVE
Protein, ur: 100 mg/dL — AB
Specific Gravity, Urine: 1.011 (ref 1.005–1.030)
pH: 6 (ref 5.0–8.0)

## 2018-08-29 LAB — PREPARE RBC (CROSSMATCH)

## 2018-08-29 LAB — COMPLETE METABOLIC PANEL WITH GFR
AG Ratio: 1.5 (calc) (ref 1.0–2.5)
ALT: 11 U/L (ref 9–46)
AST: 16 U/L (ref 10–35)
Albumin: 3.6 g/dL (ref 3.6–5.1)
Alkaline phosphatase (APISO): 43 U/L (ref 35–144)
BUN/Creatinine Ratio: 16 (calc) (ref 6–22)
BUN: 83 mg/dL — ABNORMAL HIGH (ref 7–25)
CO2: 25 mmol/L (ref 20–32)
Calcium: 9.2 mg/dL (ref 8.6–10.3)
Chloride: 104 mmol/L (ref 98–110)
Creat: 5.34 mg/dL — ABNORMAL HIGH (ref 0.70–1.18)
GFR, Est African American: 12 mL/min/{1.73_m2} — ABNORMAL LOW (ref >=60)
GFR, Est Non African American: 10 mL/min/{1.73_m2} — ABNORMAL LOW (ref >=60)
Globulin: 2.4 g/dL (calc) (ref 1.9–3.7)
Glucose, Bld: 78 mg/dL (ref 65–99)
Potassium: 4.4 mmol/L (ref 3.5–5.3)
Sodium: 139 mmol/L (ref 135–146)
Total Bilirubin: 0.3 mg/dL (ref 0.2–1.2)
Total Protein: 6 g/dL — ABNORMAL LOW (ref 6.1–8.1)

## 2018-08-29 LAB — GLUCOSE, CAPILLARY
Glucose-Capillary: 57 mg/dL — ABNORMAL LOW (ref 70–99)
Glucose-Capillary: 109 mg/dL — ABNORMAL HIGH (ref 70–99)

## 2018-08-29 LAB — SARS CORONAVIRUS 2 BY RT PCR (HOSPITAL ORDER, PERFORMED IN ~~LOC~~ HOSPITAL LAB): SARS Coronavirus 2: NEGATIVE

## 2018-08-29 LAB — POC OCCULT BLOOD, ED: Fecal Occult Bld: NEGATIVE

## 2018-08-29 LAB — FERRITIN: Ferritin: 181 ng/mL (ref 24–336)

## 2018-08-29 LAB — IRON AND TIBC
Iron: 43 ug/dL — ABNORMAL LOW (ref 45–182)
Saturation Ratios: 14 % — ABNORMAL LOW (ref 17.9–39.5)
TIBC: 314 ug/dL (ref 250–450)
UIBC: 271 ug/dL

## 2018-08-29 LAB — HEMOGLOBIN A1C
Hgb A1c MFr Bld: 7.1 % of total Hgb — ABNORMAL HIGH (ref <5.7)
Mean Plasma Glucose: 157 (calc)
eAG (mmol/L): 8.7 (calc)

## 2018-08-29 LAB — SAMPLE TO BLOOD BANK

## 2018-08-29 LAB — ABO/RH: ABO/RH(D): A POS

## 2018-08-29 MED ORDER — FUROSEMIDE 40 MG PO TABS
60.0000 mg | ORAL_TABLET | Freq: Two times a day (BID) | ORAL | Status: DC
  Administered 2018-08-30: 60 mg via ORAL
  Filled 2018-08-29 (×2): qty 1

## 2018-08-29 MED ORDER — MOMETASONE FURO-FORMOTEROL FUM 100-5 MCG/ACT IN AERO
2.0000 | INHALATION_SPRAY | Freq: Two times a day (BID) | RESPIRATORY_TRACT | Status: DC
  Filled 2018-08-29: qty 8.8

## 2018-08-29 MED ORDER — ISOSORBIDE MONONITRATE ER 60 MG PO TB24
30.0000 mg | ORAL_TABLET | Freq: Every day | ORAL | Status: DC
  Administered 2018-08-30: 30 mg via ORAL
  Filled 2018-08-29: qty 1

## 2018-08-29 MED ORDER — INSULIN ASPART 100 UNIT/ML ~~LOC~~ SOLN
0.0000 [IU] | Freq: Three times a day (TID) | SUBCUTANEOUS | Status: DC
  Administered 2018-08-30 (×2): 3 [IU] via SUBCUTANEOUS

## 2018-08-29 MED ORDER — AMLODIPINE BESYLATE 5 MG PO TABS
10.0000 mg | ORAL_TABLET | Freq: Every day | ORAL | Status: DC
  Administered 2018-08-30: 10 mg via ORAL
  Filled 2018-08-29: qty 2

## 2018-08-29 MED ORDER — TRAZODONE HCL 50 MG PO TABS: 25.0000 mg | ORAL_TABLET | Freq: Every evening | ORAL | Status: DC | PRN

## 2018-08-29 MED ORDER — HYDRALAZINE HCL 25 MG PO TABS
100.0000 mg | ORAL_TABLET | Freq: Two times a day (BID) | ORAL | Status: DC
  Administered 2018-08-29 – 2018-08-30 (×2): 100 mg via ORAL
  Filled 2018-08-29 (×2): qty 4

## 2018-08-29 MED ORDER — FUROSEMIDE 10 MG/ML IJ SOLN
40.0000 mg | Freq: Once | INTRAMUSCULAR | Status: AC
  Administered 2018-08-29: 40 mg via INTRAVENOUS
  Filled 2018-08-29: qty 4

## 2018-08-29 MED ORDER — ACETAMINOPHEN 325 MG PO TABS: 650.0000 mg | ORAL_TABLET | Freq: Four times a day (QID) | ORAL | Status: DC | PRN

## 2018-08-29 MED ORDER — ATORVASTATIN CALCIUM 40 MG PO TABS
40.0000 mg | ORAL_TABLET | Freq: Every evening | ORAL | Status: DC
  Administered 2018-08-30: 18:00:00 40 mg via ORAL
  Filled 2018-08-29: qty 1

## 2018-08-29 MED ORDER — SODIUM CHLORIDE 0.9% IV SOLUTION: Freq: Once | INTRAVENOUS | Status: DC

## 2018-08-29 MED ORDER — MONTELUKAST SODIUM 10 MG PO TABS
10.0000 mg | ORAL_TABLET | Freq: Every day | ORAL | Status: DC
  Administered 2018-08-29: 10 mg via ORAL
  Filled 2018-08-29: qty 1

## 2018-08-29 MED ORDER — ONDANSETRON HCL 4 MG PO TABS: 4.0000 mg | ORAL_TABLET | Freq: Four times a day (QID) | ORAL | Status: DC | PRN

## 2018-08-29 MED ORDER — CARVEDILOL 12.5 MG PO TABS
25.0000 mg | ORAL_TABLET | Freq: Two times a day (BID) | ORAL | Status: DC
  Administered 2018-08-29 – 2018-08-30 (×3): 25 mg via ORAL
  Filled 2018-08-29 (×3): qty 2

## 2018-08-29 MED ORDER — ALBUTEROL SULFATE (2.5 MG/3ML) 0.083% IN NEBU: 2.5000 mg | INHALATION_SOLUTION | RESPIRATORY_TRACT | Status: DC | PRN

## 2018-08-29 MED ORDER — ONDANSETRON HCL 4 MG/2ML IJ SOLN: 4.0000 mg | Freq: Four times a day (QID) | INTRAMUSCULAR | Status: DC | PRN

## 2018-08-29 MED ORDER — ACETAMINOPHEN 650 MG RE SUPP: 650.0000 mg | Freq: Four times a day (QID) | RECTAL | Status: DC | PRN

## 2018-08-29 NOTE — ED Notes (Signed)
Pt was informed we need urine sample. 

## 2018-08-29 NOTE — ED Triage Notes (Signed)
Patient sent in from MD office for abnormal labs (low hemeglobin 8) with fatigue.

## 2018-08-29 NOTE — H&P (Signed)
History and Physical    [name] XTG:[id number] DOB: January 23, 1951 DOA: 08/29/2018  PCP: [name], NP   Patient coming from: Home  I have personally briefly reviewed patient's old medical records in [place]  Chief Complaint: Abnormal Labs  HPI: Henry Carroll is a 68 y.o. male with medical history significant for chronic kidney disease, diabetes mellitus, hypertension, systolic and diastolic CHF, coronary artery disease, asthma, obstructive sleep apnea and transfusion dependent anemia.  Patient went to do his pre-clinic visit blood work yesterday, he was called today to come to the ED as his hemoglobin had dropped to 8. Patient reports feeling easily winded with exertion over the past 2 days.  He denies symptoms at rest.  No chest pain.  He has chronic lower extremity swelling that is unchanged.  No vomiting, no black or bloody stools.  No abdominal pain.  Patient follows with nephrology, but he denies being on any medications like erythropoietin.  ED Course: Patient systolic 270J to 500X, heart rates mostly 70s.  O2 sats greater than 96% on room air.  Hemoglobin 8.1.  Creatinine 5.7 today, BUN 87.  UA with rare bacteria.  Stool FOBT negative.  Port chest x-ray negative for acute abnormality.  Hospitalist called to admit for symptomatic anemia.  Review of Systems: As per HPI all other systems reviewed and negative.  Past Medical History:  Diagnosis Date  . Anemia   . 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 Asthma   . Chronic combined systolic (congestive) and diastolic (congestive) heart failure ([place])    a. 12/31/14: 2D ECHO: EF 40-45%, HK of inf myocardium, G1DD, mod MR  . CKD (chronic kidney disease), stage III (HCC)    stage 3 kidney disease  . Coronary artery disease    a. LHC 01/2015 - triple vessel CAD (mod oLM, mLAD, severe mRCA, intermediate branch stenosis, CTO of mCx). Plan CABG 02/2015.  Marland Kitchen GERD (gastroesophageal reflux disease)   . Hyperlipidemia    . Hypertension   . Mitral regurgitation    a. Mild-mod by echo 12/2014.  Marland Kitchen Myocardial infarction [facility])    pt. states per [name] he has in the past  . NSVT (nonsustained ventricular tachycardia) ([place])    a. 9 beats during 01/2015 adm. BB titrated.  . Obesity    a. BMI 33  . OSA (obstructive sleep apnea) 01/16/2018   Mild obstructive sleep apnea overall with an AHI of 7.3/h and no significant central sleep apnea. Severe obstructive sleep apnea during REM sleep with an AHI of 34.3/h.  Now on CPAP at 6 cm H2O.   . Sleep apnea    2019, Dr.Turner diagnoised   . Type II diabetes mellitus ([place])     Past Surgical History:  Procedure Laterality Date  . BACK SURGERY    . CARDIAC CATHETERIZATION N/A 02/09/2015   Procedure: Left Heart Cath and Coronary Angiography;  Surgeon: Burnell Blanks, MD;  Location: [facility];  Service: Cardiovascular;  Laterality: N/A;  . COLONOSCOPY  2011   Dr. [name]: Ileocecal valve appeared normal, scattered pancolonic diverticulosis, difficult bowel prep making smaller lesions potentially missed. Recommended three-year follow-up colonoscopy.  . COLONOSCOPY  2003   Dr. [name]: Suspicious lesion at the ileocecal valve, multiple biopsies benign, pancolonic diverticulosis.  . COLONOSCOPY N/A 02/15/2016   diverticulosis in sigmoid and descending colon, single 5 mm polyp at splenic flexure (Tubular adenoma)  . CORONARY ARTERY BYPASS GRAFT N/A 02/18/2015   Procedure:  CORONARY ARTERY BYPASS GRAFTING (CABG) x  four, using bilateral internal mammary arteries and right leg greater saphenous vein harvested endoscopically;  Surgeon: [name], MD;  Location: [facility];  Service: Open Heart Surgery;  Laterality: N/A;  . ESOPHAGOGASTRODUODENOSCOPY N/A 02/15/2016   normal  . EYE SURGERY Right    July 2019   . GIVENS CAPSULE STUDY N/A 01/08/2017   couple of gastric and small bowel eroions in setting of aspirin 81 mg daily but nothing concerning, continue Hematology  follow-up  . HERNIA REPAIR  3428   Umbilical  . LUMBAR [place] SURGERY  2006   L4 & L5  . REFRACTIVE SURGERY Left    2019   . REFRACTIVE SURGERY Right    2019  . TEE WITHOUT CARDIOVERSION N/A 02/18/2015   Procedure: TRANSESOPHAGEAL ECHOCARDIOGRAM (TEE);  Surgeon: [name], MD;  Location: [facility];  Service: Open Heart Surgery;  Laterality: N/A;  . TONSILLECTOMY  1962     reports that he quit smoking about 3 years ago. His smoking use included cigars. He quit after 17.00 years of use. He has never used smokeless tobacco. He reports current alcohol use. He reports that he does not use drugs.  No Known Allergies  Family History  Problem Relation Age of Onset  . Diabetes Mother   . Heart disease Mother        later in life, age >8  . Heart disease Father        heart failure later in life  . Hypertension Father   . Stroke Sister   . Kidney disease Daughter   . Sudden death Daughter   . Colon cancer Paternal Uncle        Passed from colon CA  . Heart attack Neg Hx     Prior to Admission medications   Medication Sig Start Date End Date Taking? Authorizing Provider  acetaminophen (TYLENOL) 500 MG tablet Take 1,000 mg by mouth 2 (two) times daily as needed for moderate pain.   Yes [provider]  albuterol (PROVENTIL) (2.5 MG/3ML) 0.083% nebulizer solution USE ONE VIAL IN NEBULIZER EVERY 6 HOURS AS NEEDED FOR WHEEZING FOR SHORTNESS OF BREATH 08/04/18  Yes [name], NP  amLODipine (NORVASC) 10 MG tablet Take 1 tablet (10 mg total) by mouth daily. 06/23/18  Yes [name], NP  aspirin EC 81 MG tablet Take 1 tablet (81 mg total) by mouth daily. 01/24/16  Yes Burtis Junes, NP  atorvastatin (LIPITOR) 40 MG tablet TAKE 1 TABLET BY MOUTH EVERY EVENING 06/10/18  Yes Jerline Pain, MD  carvedilol (COREG) 25 MG tablet Take 25 mg by mouth 2 (two) times daily with a meal.   Yes [provider]  Cholecalciferol ([employer] VITAMIN D3) 1000 UNITS capsule Take 2  capsules (2,000 Units total) by mouth daily. 10/07/13  Yes Blanchie Serve, MD  Cinnamon 500 MG capsule Take 1,000 mg by mouth 2 (two) times daily.    Yes [provider]  Coenzyme Q10 (CO Q 10 PO) Take 200 mg by mouth daily.    Yes [provider]  fluticasone (FLONASE) 50 MCG/ACT nasal spray Place 2 sprays into both nostrils daily as needed for allergies or rhinitis (SEASONAL ALLERGIES).   Yes [provider]  Fluticasone-Salmeterol (ADVAIR DISKUS) 100-50 MCG/DOSE AEPB Inhale 1 puff into the lungs 2 (two) times daily. 09/30/17  Yes Eulas Post, Monica, DO  furosemide (LASIX) 40 MG tablet TAKE 1 & 1/2 (ONE & ONE-HALF) TABLETS BY  MOUTH TWICE DAILY Patient taking differently: Take 60 mg by mouth 2 (two) times daily.  06/23/18  Yes Jerline Pain, MD  glucose blood (ONE TOUCH ULTRA TEST) test strip Use to test blood sugar three times daily. Dx: E11.21 12/04/17  Yes Gildardo Cranker, DO  hydrALAZINE (APRESOLINE) 100 MG tablet Take 100 mg by mouth 2 (two) times daily. 06/21/18  Yes [provider]  insulin detemir (LEVEMIR) 100 UNIT/ML injection Inject 30 Units into the skin daily as needed.    Yes [provider]  isosorbide mononitrate (IMDUR) 30 MG 24 hr tablet Take 1 tablet (30 mg total) by mouth daily. 04/02/18 08/29/18 Yes Burtis Junes, NP  montelukast (SINGULAIR) 10 MG tablet Take 1 tablet (10 mg total) by mouth at bedtime. 06/23/18  Yes [name], NP  Multiple Vitamins-Minerals (ONE-A-DAY MENS 50+ ADVANTAGE) TABS Take 1 tablet by mouth daily.   Yes [provider]  nitroGLYCERIN (NITROSTAT) 0.4 MG SL tablet Place 1 tablet (0.4 mg total) under the tongue every 5 (five) minutes as needed for chest pain. 05/22/16 11/26/48 Yes Burtis Junes, NP  PROAIR HFA 108 [phone] Base) MCG/ACT inhaler INHALE 2 PUFFS INTO LUNGS EVERY 6 HOURS AS NEEDED FOR SHORTNESS OF BREATH 08/01/18  Yes [name], NP    Physical Exam: Vitals:   08/29/18 1630 08/29/18  1700 08/29/18 1730 08/29/18 1800  BP: (!) 182/93 (!) 182/88 (!) 172/82 (!) 166/95  Pulse: 74 77 73 69  Resp:      Temp:      TempSrc:      SpO2: 100% 96% 96% 98%  Weight:      Height:        Constitutional: NAD, calm, comfortable Vitals:   08/29/18 1630 08/29/18 1700 08/29/18 1730 08/29/18 1800  BP: (!) 182/93 (!) 182/88 (!) 172/82 (!) 166/95  Pulse: 74 77 73 69  Resp:      Temp:      TempSrc:      SpO2: 100% 96% 96% 98%  Weight:      Height:       Eyes: PERRL, lids and conjunctivae normal ENMT: Mucous membranes are moist. Posterior pharynx clear of any exudate or lesions. Neck: normal, supple, no masses, no thyromegaly Respiratory: clear to auscultation bilaterally, no wheezing, no crackles. Normal respiratory effort. No accessory muscle use.  Cardiovascular: Regular rate and rhythm, no murmurs / rubs / gallops. 1+ pitting pedal edema to knees. 2+ pedal pulses.  Abdomen: full, without tenderness, no masses palpated. No hepatosplenomegaly. Bowel sounds positive.  Musculoskeletal: no clubbing / cyanosis. No joint deformity upper and lower extremities. Good ROM, no contractures. Normal muscle tone.  Skin: no rashes, lesions, ulcers. No induration Neurologic: CN 2-12 grossly intact. Sensation intact, DTR normal. Strength 5/5 in all 4.  Psychiatric: Normal judgment and insight. Alert and oriented x 3. Normal mood.   Labs on Admission: I have personally reviewed following labs and imaging studies  CBC: Recent Labs  Lab 08/29/18 1517  WBC 4.6  NEUTROABS 2.9  HGB 8.1*  HCT 24.1*  MCV 95.6  PLT 956   Basic Metabolic Panel: Recent Labs  Lab 08/28/18 1036 08/29/18 1517  NA 139 139  K 4.4 4.3  CL 104 105  CO2 25 24  GLUCOSE 78 82  BUN 83* 87*  CREATININE 5.34* 5.73*  CALCIUM 9.2 9.2   Liver Function Tests: Recent Labs  Lab 08/28/18 1036 08/29/18 1517  AST 16 27  ALT 11 19  ALKPHOS  --  43  BILITOT 0.3 0.6  PROT 6.0* 6.4*  ALBUMIN  --  3.5   BNP (last 3  results) Recent Labs    04/01/18 1019  PROBNP 2,186*   HbA1C: Recent Labs    08/28/18 1036  HGBA1C 7.1*   Lipid Profile: Recent Labs    08/28/18 1036  CHOL 151  HDL 51  LDLCALC 80  TRIG 104  CHOLHDL 3.0   Urine analysis:    Component Value Date/Time   COLORURINE STRAW (A) 08/29/2018 1700   APPEARANCEUR CLEAR 08/29/2018 1700   LABSPEC 1.011 08/29/2018 1700   PHURINE 6.0 08/29/2018 1700   GLUCOSEU NEGATIVE 08/29/2018 1700   HGBUR NEGATIVE 08/29/2018 1700   BILIRUBINUR NEGATIVE 08/29/2018 1700   KETONESUR NEGATIVE 08/29/2018 1700   PROTEINUR 100 (A) 08/29/2018 1700   NITRITE NEGATIVE 08/29/2018 1700   LEUKOCYTESUR NEGATIVE 08/29/2018 1700    Radiological Exams on Admission: Dg Chest Portable 1 View  Result Date: 08/29/2018 CLINICAL DATA:  Anemia. EXAM: PORTABLE CHEST 1 VIEW COMPARISON:  Radiographs of April 01, 2018. FINDINGS: Stable cardiomegaly. No pneumothorax or pleural effusion is noted. Sternotomy wires are noted. Both lungs are clear. The visualized skeletal structures are unremarkable. IMPRESSION: No active disease. Electronically Signed   By: Marijo Conception M.D.   On: 08/29/2018 15:47    EKG: None.  Assessment/Plan Active Problems:   Symptomatic anemia   Symptomatic anemia- hemoglobin 8, baseline ~10.  Negative stool fecal occult.  Likely secondary to progression of his chronic renal insufficiency. No history to suggest GI blood loss at this time.  Last EGD and colonoscopy 2018 except for diverticulosis and a polyp in the splenic flexure were both unremarkable.  Not on erythropoietin or similar agents. -Transfuse 1 unit PRBC -IV Lasix 40 mg x 1 -Monitor hemoglobin closely -Hold home aspirin -Iron studies -Would benefit from erythropoietin, Aranesp.   Systolic and diastolic CHF- compensated, unchanged chronic lower extremity swelling.  Decreasing weight over the past 6 months.  Last echo 03/2018, EF 50 to 55%, impaired diastolic relaxation.  -Continue home Lasix 60 mg twice daily -Continue home Coreg, hydralazine, Imdur,  Chronic kidney disease- now stage V.  Progressing.  Follows with nephrologist Dr. Posey Pronto.  Creatinine today 5.73, BUN 82. Cr yesterday 5.3 at last check, February- 4.8.  He is yet to establish HD access. -Would benefit from erythropoietin -Continue home Lasix  DM 2-random glucose 82. - SSI -Hold home Levemir 30 units nightly for now.  Hypertension-elevated 160s to 180s. -Continue home hydralazine, Norvasc, Lasix, Coreg. -PRN hydralazine  Asthma, obstructive sleep apnea-stable. -CPAP nightly -Continue home bronchodilators  CAD history-stable.  History of CABG 2017. -Continue home statins, aspirin, Coreg and Imdur.   DVT prophylaxis: SCDs Code Status: Full Family Communication: None at bedside Disposition Plan: 1 to 2 days  consults called: None Admission status: Observation, telemetry.   Bethena Roys MD [facility]  08/29/2018, 6:54 PM

## 2018-08-29 NOTE — ED Provider Notes (Addendum)
[facility] Provider Note   CSN: [id number] Arrival date & time: 08/29/18  1429     History   Chief Complaint Chief Complaint  Patient presents with  . Abnormal Lab    low hemeglobin per MD office    HPI Henry Carroll is a 68 y.o. male.     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  Patient states that he has had lab work done at his primary physician on yesterday, he received a call to go to the hospital today because he had hemoglobin of 8.  The patient states that the low as he can remember was 8.7, but never 8.  Review of the patient's previous labs show that his last recorded hemoglobin at this facility was 10.4.  The patient states that he has been symptomatic and that he has had fatigue and generally not feeling well over the last few days.  There is been no syncopal episodes.  The patient states that he has not seen blood in his stool or blood in his urine.  He has not had abdominal pain.  He is not had abdominal procedures.  He is not been diagnosed with any cancer of any kind that he is aware of.  He presents to the emergency department at this time for evaluation of this issue.  The history is provided by the patient.    Past Medical History:  Diagnosis Date  . Anemia   . 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 Asthma   . Chronic combined systolic (congestive) and diastolic (congestive) heart failure ([place])    a. 12/31/14: 2D ECHO: EF 40-45%, HK of inf myocardium, G1DD, mod MR  . CKD (chronic kidney disease), stage III (HCC)    stage 3 kidney disease  . Coronary artery disease    a. LHC 01/2015 - triple vessel CAD (mod oLM, mLAD, severe mRCA, intermediate branch stenosis, CTO of mCx). Plan CABG 02/2015.  Marland Kitchen GERD (gastroesophageal reflux disease)   . Hyperlipidemia   . Hypertension   . Mitral  regurgitation    a. Mild-mod by echo 12/2014.  Marland Kitchen Myocardial infarction [facility])    pt. states per [name] he has in the past  . NSVT (nonsustained ventricular tachycardia) ([place])    a. 9 beats during 01/2015 adm. BB titrated.  . Obesity    a. BMI 33  . OSA (obstructive sleep apnea) 01/16/2018   Mild obstructive sleep apnea overall with an AHI of 7.3/h and no significant central sleep apnea. Severe obstructive sleep apnea during REM sleep with an AHI of 34.3/h.  Now on CPAP at 6 cm H2O.   . Sleep apnea    2019, Dr.Turner diagnoised   . Type II diabetes mellitus [facility])     Patient Active Problem List   Diagnosis Date Noted  . OSA (obstructive sleep apnea) 01/16/2018  . Small bowel lesion   . Gastric erosion   . Iron deficiency anemia 12/10/2016  . IDA (iron deficiency anemia) 01/23/2016  . Melena 01/23/2016  . 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 Bilateral lower extremity edema 12/22/2015  . Asthma, chronic, mild intermittent, with acute exacerbation 12/22/2015  . PAD (peripheral artery disease) ([place]) 03/02/2015  . Chronic systolic heart failure ([place]) 02/25/2015  . Hypertensive heart disease with heart failure ([place]) 02/25/2015  . CAD (coronary artery disease) 02/18/2015  .  CAD in native artery 02/10/2015  . Abnormal stress test 02/10/2015  . Mitral regurgitation 02/10/2015  . Chronic combined systolic and diastolic CHF (congestive heart failure) ([place]) 02/10/2015  . NSVT (nonsustained ventricular tachycardia) ([place]) 02/10/2015  . Acute on chronic systolic and diastolic heart failure, NYHA class 1 ([place]) 12/31/2014  . Hyperlipidemia with target LDL less than 100 12/31/2014  . Demand ischemia ([place]) 12/31/2014  . Morbid obesity ([place]) 12/29/2014  . Elevated troponin   . Essential hypertension 10/09/2013  . DM type 2, uncontrolled, with renal complications ([place]) 53/29/9242  . Obesity (BMI 30-39.9) 05/05/2013  . CKD (chronic kidney disease) stage 3, GFR 30-59 ml/min (HCC)  02/18/2013  . Folliculitis of perineum 10/01/2012  . Anemia in chronic kidney disease 09/03/2012  . Other malaise and fatigue 09/03/2012  . 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 Asthma, chronic 06/04/2012  . COLONIC POLYPS, HX OF 04/13/2009    Past Surgical History:  Procedure Laterality Date  . BACK SURGERY    . CARDIAC CATHETERIZATION N/A 02/09/2015   Procedure: Left Heart Cath and Coronary Angiography;  Surgeon: Burnell Blanks, MD;  Location: [place] CV LAB;  Service: Cardiovascular;  Laterality: N/A;  . COLONOSCOPY  2011   Dr. [name]: Ileocecal valve appeared normal, scattered pancolonic diverticulosis, difficult bowel prep making smaller lesions potentially missed. Recommended three-year follow-up colonoscopy.  . COLONOSCOPY  2003   Dr. [name]: Suspicious lesion at the ileocecal valve, multiple biopsies benign, pancolonic diverticulosis.  . COLONOSCOPY N/A 02/15/2016   diverticulosis in sigmoid and descending colon, single 5 mm polyp at splenic flexure (Tubular adenoma)  . CORONARY ARTERY BYPASS GRAFT N/A 02/18/2015   Procedure: CORONARY ARTERY BYPASS GRAFTING (CABG) x  four, using bilateral internal mammary arteries and right leg greater saphenous vein harvested endoscopically;  Surgeon: [name], MD;  Location: [place] OR;  Service: Open Heart Surgery;  Laterality: N/A;  . ESOPHAGOGASTRODUODENOSCOPY N/A 02/15/2016   normal  . EYE SURGERY Right    July 2019   . GIVENS CAPSULE STUDY N/A 01/08/2017   couple of gastric and small bowel eroions in setting of aspirin 81 mg daily but nothing concerning, continue Hematology follow-up  . HERNIA REPAIR  [id number]   Umbilical  . LUMBAR [place] SURGERY  2006   L4 & L5  . REFRACTIVE SURGERY Left    2019   . REFRACTIVE SURGERY Right    2019  . TEE WITHOUT CARDIOVERSION N/A 02/18/2015   Procedure: TRANSESOPHAGEAL ECHOCARDIOGRAM (TEE);  Surgeon: [name], MD;  Location: [place];  Service: Open Heart Surgery;  Laterality: N/A;  .  TONSILLECTOMY  1962        Home Medications    Prior to Admission medications   Medication Sig Start Date End Date Taking? Authorizing Provider  acetaminophen (TYLENOL) 500 MG tablet Take 1,000 mg by mouth 2 (two) times daily as needed for moderate pain.    [provider]  albuterol (PROVENTIL) (2.5 MG/3ML) 0.083% nebulizer solution USE ONE VIAL IN NEBULIZER EVERY 6 HOURS AS NEEDED FOR WHEEZING FOR SHORTNESS OF BREATH 08/04/18   [name], NP  amLODipine (NORVASC) 10 MG tablet Take 1 tablet (10 mg total) by mouth daily. 06/23/18   [name], NP  aspirin EC 81 MG tablet Take 1 tablet (81 mg total) by mouth daily. 01/24/16   Burtis Junes, NP  atorvastatin (LIPITOR) 40 MG tablet TAKE 1 TABLET BY MOUTH EVERY EVENING 06/10/18   Jerline Pain, MD  carvedilol (COREG) 25  MG tablet Take 25 mg by mouth 2 (two) times daily with a meal.    [provider]  Cholecalciferol ([employer]S VITAMIN D3) 1000 UNITS capsule Take 2 capsules (2,000 Units total) by mouth daily. 10/07/13   Blanchie Serve, MD  CINNAMON PO Take 1,000 mg by mouth 2 (two) times daily.     [provider]  Coenzyme Q10 (CO Q 10 PO) Take 200 mg by mouth daily.     [provider]  fluticasone (FLONASE) 50 MCG/ACT nasal spray Place 2 sprays into both nostrils daily as needed for allergies or rhinitis (SEASONAL ALLERGIES).    [provider]  Fluticasone-Salmeterol (ADVAIR DISKUS) 100-50 MCG/DOSE AEPB Inhale 1 puff into the lungs 2 (two) times daily. 09/30/17   Gildardo Cranker, DO  furosemide (LASIX) 40 MG tablet TAKE 1 & 1/2 (ONE & ONE-HALF) TABLETS BY MOUTH TWICE DAILY 06/23/18   Jerline Pain, MD  glucose blood (ONE TOUCH ULTRA TEST) test strip Use to test blood sugar three times daily. Dx: E11.21 12/04/17   Gildardo Cranker, DO  hydrALAZINE (APRESOLINE) 100 MG tablet Take 100 mg by mouth 2 (two) times daily. 06/21/18   [provider]  insulin detemir (LEVEMIR) 100 UNIT/ML  injection Inject 40 Units into the skin daily.    [provider]  isosorbide mononitrate (IMDUR) 30 MG 24 hr tablet Take 1 tablet (30 mg total) by mouth daily. 04/02/18 07/08/18  Burtis Junes, NP  montelukast (SINGULAIR) 10 MG tablet Take 1 tablet (10 mg total) by mouth at bedtime. 06/23/18   [name], NP  Multiple Vitamins-Minerals (ONE-A-DAY MENS 50+ ADVANTAGE) TABS Take 1 tablet by mouth daily.    [provider]  nitroGLYCERIN (NITROSTAT) 0.4 MG SL tablet Place 1 tablet (0.4 mg total) under the tongue every 5 (five) minutes as needed for chest pain. 05/22/16 11/26/48  Burtis Junes, NP  PROAIR HFA 108 [phone] Base) MCG/ACT inhaler INHALE 2 PUFFS INTO LUNGS EVERY 6 HOURS AS NEEDED FOR SHORTNESS OF BREATH 08/01/18   [name], NP    Family History Family History  Problem Relation Age of Onset  . Diabetes Mother   . Heart disease Mother        later in life, age >21  . Heart disease Father        heart failure later in life  . Hypertension Father   . Stroke Sister   . Kidney disease Daughter   . Sudden death Daughter   . Colon cancer Paternal Uncle        Passed from colon CA  . Heart attack Neg Hx     Social History Social History   Tobacco Use  . Smoking status: Former Smoker    Years: 17.00    Types: Cigars    Quit date: 12/31/2014    Years since quitting: 3.6  . Smokeless tobacco: Never Used  Substance Use Topics  . Alcohol use: Yes    Alcohol/week: 0.0 standard drinks    Comment: occasional glass of wine.  . Drug use: No     Allergies   Patient has no known allergies.   Review of Systems Review of Systems  Constitutional: Positive for fatigue. Negative for activity change and appetite change.  HENT: Negative for congestion, ear discharge, ear pain, facial swelling, nosebleeds, rhinorrhea, sneezing and tinnitus.   Eyes: Negative for photophobia, pain and discharge.  Respiratory: Negative for cough, choking, shortness of  breath and wheezing.   Cardiovascular: Negative  for chest pain, palpitations and leg swelling.  Gastrointestinal: Negative for abdominal pain, blood in stool, constipation, diarrhea, nausea and vomiting.  Genitourinary: Negative for difficulty urinating, dysuria, flank pain, frequency and hematuria.  Musculoskeletal: Negative for back pain, gait problem, myalgias and neck pain.  Skin: Negative for color change, rash and wound.  Neurological: Negative for dizziness, seizures, syncope, facial asymmetry, speech difficulty, weakness and numbness.  Hematological: Negative for adenopathy. Does not bruise/bleed easily.  Psychiatric/Behavioral: Negative for agitation, confusion, hallucinations, self-injury and suicidal ideas. The patient is not nervous/anxious.      Physical Exam Updated Vital Signs BP (!) 162/81 (BP Location: Right Arm)   Pulse 73   Temp 97.8 F (36.6 C) (Oral)   Resp 15   Ht 6\' 1"  (1.854 m)   Wt 97.5 kg   SpO2 100%   BMI 28.37 kg/m   Physical Exam Vitals signs and nursing note reviewed.  Constitutional:      Appearance: He is well-developed. He is not toxic-appearing.  HENT:     Head: Normocephalic.     Right Ear: Tympanic membrane and external ear normal.     Left Ear: Tympanic membrane and external ear normal.  Eyes:     General: Lids are normal.     Pupils: Pupils are equal, round, and reactive to light.  Neck:     Musculoskeletal: Normal range of motion and neck supple.     Vascular: No carotid bruit.  Cardiovascular:     Rate and Rhythm: Normal rate and regular rhythm.     Pulses: Normal pulses.     Heart sounds: Normal heart sounds.  Pulmonary:     Effort: No respiratory distress.     Breath sounds: Normal breath sounds.  Abdominal:     General: Bowel sounds are normal.     Palpations: Abdomen is soft.     Tenderness: There is no abdominal tenderness. There is no guarding.  Genitourinary:    Rectum: Guaiac result positive.  Musculoskeletal: Normal  range of motion.     Right lower leg: Edema present.     Left lower leg: Edema present.     Comments: Palms are pale.  Lymphadenopathy:     Head:     Right side of head: No submandibular adenopathy.     Left side of head: No submandibular adenopathy.     Cervical: No cervical adenopathy.  Skin:    General: Skin is warm and dry.  Neurological:     Mental Status: He is alert and oriented to person, place, and time.     Cranial Nerves: No cranial nerve deficit.     Sensory: No sensory deficit.  Psychiatric:        Speech: Speech normal.      ED Treatments / Results  Labs (all labs ordered are listed, but only abnormal results are displayed) Labs Reviewed - No data to display  EKG None  Radiology No results found.  Procedures Procedures (including critical care time) CRITICAL CARE Performed by: Lily Kocher Total critical care time: **30* minutes Critical care time was exclusive of separately billable procedures and treating other patients. Critical care was necessary to treat or prevent imminent or life-threatening deterioration. Critical care was time spent personally by me on the following activities: development of treatment plan with patient and/or surrogate as well as nursing, discussions with consultants, evaluation of patient's response to treatment, examination of patient, obtaining history from patient or surrogate, ordering and performing treatments and interventions, ordering and  review of laboratory studies, ordering and review of radiographic studies, pulse oximetry and re-evaluation of patient's condition.  Medications Ordered in ED Medications - No data to display   Initial Impression / Assessment and Plan / ED Course  I have reviewed the triage vital signs and the nursing notes.  Pertinent labs & imaging results that were available during my care of the patient were reviewed by me and considered in my medical decision making (see chart for details).           Final Clinical Impressions(s) / ED Diagnoses MDM  Blood pressure is slightly elevated, otherwise vital signs within normal limits.  Pulse oximetry is 100% on room air.  Within normal limits by my interpretation.  Stool for occult blood is negative.  Chest x-ray is negative.  There is no elevation in the white blood cell count.  Previous labs and office visits reviewed. Case discussed with Dr Thurnell Garbe.   Previous labs revealed a hemoglobin above 10.  Hemoglobin is now 8.1. We will discussed the case with the triad hospitalist for admission.  Recheck. Vial signs stable except for blood pressure remains elevated. Appropriate labs ordered and reviewed  Case discussed with hospitalist. Pt to be admitted.   Final diagnoses:  Symptomatic anemia    ED Discharge Orders    None       Lily Kocher, Hershal Coria 08/29/18 1751    Noemi Chapel, MD 08/31/18 1612    Lily Kocher, PA-C 09/11/18 1631    Noemi Chapel, MD 09/16/18 2053

## 2018-08-29 NOTE — ED Notes (Signed)
Radiology at bedside

## 2018-08-29 NOTE — ED Notes (Signed)
ED TO INPATIENT HANDOFF REPORT  ED Nurse Name and Phone #: [phone]  S Name/Age/Gender Henry Carroll 68 y.o. male Room/Bed: APA02/APA02  Code Status   Code Status: Prior  Home/SNF/Other Home Patient oriented to: self, place, time and situation Is this baseline? Yes   Triage Complete: Triage complete  Chief Complaint abnormal labs  Triage Note Patient sent in from MD office for abnormal labs (low hemeglobin 8) with fatigue.     Allergies No Known Allergies  Level of Care/Admitting Diagnosis ED Disposition    ED Disposition Condition [facility] Area: [facility] [[id number]]  Level of Care: Telemetry [5]  Covid Evaluation: Asymptomatic Screening Protocol (No Symptoms)  Diagnosis: Symptomatic anemia [[id number]]  Admitting Physician: Bethena Roys [phone]  Attending Physician: Bethena Roys Nessa.Cuff  PT Class (Do Not Modify): Observation [104]  PT Acc Code (Do Not Modify): Observation [10022]       B Medical/Surgery History Past Medical History:  Diagnosis Date  . Anemia   . 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 Asthma   . Chronic combined systolic (congestive) and diastolic (congestive) heart failure ([place])    a. 12/31/14: 2D ECHO: EF 40-45%, HK of inf myocardium, G1DD, mod MR  . CKD (chronic kidney disease), stage III (HCC)    stage 3 kidney disease  . Coronary artery disease    a. LHC 01/2015 - triple vessel CAD (mod oLM, mLAD, severe mRCA, intermediate branch stenosis, CTO of mCx). Plan CABG 02/2015.  Marland Kitchen GERD (gastroesophageal reflux disease)   . Hyperlipidemia   . Hypertension   . Mitral regurgitation    a. Mild-mod by echo 12/2014.  Marland Kitchen Myocardial infarction [facility])    pt. states per [name] he has in the past  . NSVT (nonsustained ventricular tachycardia) ([place])    a. 9 beats during 01/2015 adm. BB titrated.  . Obesity    a. BMI 33  . OSA (obstructive sleep apnea) 01/16/2018   Mild obstructive sleep apnea overall with  an AHI of 7.3/h and no significant central sleep apnea. Severe obstructive sleep apnea during REM sleep with an AHI of 34.3/h.  Now on CPAP at 6 cm H2O.   . Sleep apnea    2019, Dr.Turner diagnoised   . Type II diabetes mellitus ([place])    Past Surgical History:  Procedure Laterality Date  . BACK SURGERY    . CARDIAC CATHETERIZATION N/A 02/09/2015   Procedure: Left Heart Cath and Coronary Angiography;  Surgeon: Burnell Blanks, MD;  Location: [place] CV LAB;  Service: Cardiovascular;  Laterality: N/A;  . COLONOSCOPY  2011   Dr. [name]: Ileocecal valve appeared normal, scattered pancolonic diverticulosis, difficult bowel prep making smaller lesions potentially missed. Recommended three-year follow-up colonoscopy.  . COLONOSCOPY  2003   Dr. [name]: Suspicious lesion at the ileocecal valve, multiple biopsies benign, pancolonic diverticulosis.  . COLONOSCOPY N/A 02/15/2016   diverticulosis in sigmoid and descending colon, single 5 mm polyp at splenic flexure (Tubular adenoma)  . CORONARY ARTERY BYPASS GRAFT N/A 02/18/2015   Procedure: CORONARY ARTERY BYPASS GRAFTING (CABG) x  four, using bilateral internal mammary arteries and right leg greater saphenous vein harvested endoscopically;  Surgeon: [name], MD;  Location: [place] OR;  Service: Open Heart Surgery;  Laterality: N/A;  . ESOPHAGOGASTRODUODENOSCOPY N/A 02/15/2016   normal  . EYE SURGERY Right    July 2019   . GIVENS CAPSULE STUDY N/A 01/08/2017   couple of gastric  and small bowel eroions in setting of aspirin 81 mg daily but nothing concerning, continue Hematology follow-up  . HERNIA REPAIR  8016   Umbilical  . LUMBAR [place] SURGERY  2006   L4 & L5  . REFRACTIVE SURGERY Left    2019   . REFRACTIVE SURGERY Right    2019  . TEE WITHOUT CARDIOVERSION N/A 02/18/2015   Procedure: TRANSESOPHAGEAL ECHOCARDIOGRAM (TEE);  Surgeon: [name], MD;  Location: [place];  Service: Open Heart Surgery;  Laterality: N/A;  . TONSILLECTOMY  1962      A IV Location/Drains/Wounds Patient Lines/Drains/Airways Status   Active Line/Drains/Airways    Name:   Placement date:   Placement time:   Site:   Days:   Peripheral IV 08/29/18 Right Antecubital   08/29/18    1521    Antecubital   less than 1   Incision (Closed) 02/18/15 Chest Other (Comment)   02/18/15    0919     1288   Incision (Closed) 02/18/15 Leg Right   02/18/15    0919     1288          Intake/Output Last 24 hours No intake or output data in the 24 hours ending 08/29/18 1805  Labs/Imaging Results for orders placed or performed during the hospital encounter of 08/29/18 (from the past 48 hour(s))  POC occult blood, ED     Status: None   Collection Time: 08/29/18  3:09 PM  Result Value Ref Range   Fecal Occult Bld NEGATIVE NEGATIVE  Comprehensive metabolic panel     Status: Abnormal   Collection Time: 08/29/18  3:17 PM  Result Value Ref Range   Sodium 139 135 - 145 mmol/L   Potassium 4.3 3.5 - 5.1 mmol/L   Chloride 105 98 - 111 mmol/L   CO2 24 22 - 32 mmol/L   Glucose, Bld 82 70 - 99 mg/dL   BUN 87 (H) 8 - 23 mg/dL   Creatinine, Ser 5.73 (H) 0.61 - 1.24 mg/dL   Calcium 9.2 8.9 - 10.3 mg/dL   Total Protein 6.4 (L) 6.5 - 8.1 g/dL   Albumin 3.5 3.5 - 5.0 g/dL   AST 27 15 - 41 U/L   ALT 19 0 - 44 U/L   Alkaline Phosphatase 43 38 - 126 U/L   Total Bilirubin 0.6 0.3 - 1.2 mg/dL   GFR calc non Af Amer 9 (L) >60 mL/min   GFR calc Af Amer 11 (L) >60 mL/min   Anion gap 10 5 - 15    Comment: Performed at [facility], [address], [place], [place] [address]  CBC with Differential     Status: Abnormal   Collection Time: 08/29/18  3:17 PM  Result Value Ref Range   WBC 4.6 4.0 - 10.5 K/uL   RBC 2.52 (L) 4.22 - 5.81 MIL/uL   Hemoglobin 8.1 (L) 13.0 - 17.0 g/dL   HCT 24.1 (L) 39.0 - 52.0 %   MCV 95.6 80.0 - 100.0 fL   MCH 32.1 26.0 - 34.0 pg   MCHC 33.6 30.0 - 36.0 g/dL   RDW 12.4 11.5 - 15.5 %   Platelets 223 150 - 400 K/uL   nRBC 0.0 0.0 - 0.2 %   Neutrophils  Relative % 64 %   Neutro Abs 2.9 1.7 - 7.7 K/uL   Lymphocytes Relative 20 %   Lymphs Abs 0.9 0.7 - 4.0 K/uL   Monocytes Relative 13 %   Monocytes Absolute 0.6 0.1 -  1.0 K/uL   Eosinophils Relative 3 %   Eosinophils Absolute 0.1 0.0 - 0.5 K/uL   Basophils Relative 0 %   Basophils Absolute 0.0 0.0 - 0.1 K/uL   Immature Granulocytes 0 %   Abs Immature Granulocytes 0.00 0.00 - 0.07 K/uL    Comment: Performed at [facility], [address] [place], [place] [address]  Sample to Blood Bank     Status: None   Collection Time: 08/29/18  3:17 PM  Result Value Ref Range   Blood Bank Specimen SAMPLE AVAILABLE FOR TESTING    Sample Expiration      09/01/2018,2359 Performed at [facility], [address] [place], [place] [address]   Urinalysis, Routine w reflex microscopic     Status: Abnormal   Collection Time: 08/29/18  5:00 PM  Result Value Ref Range   Color, Urine STRAW (A) YELLOW   APPearance CLEAR CLEAR   Specific Gravity, Urine 1.011 1.005 - 1.030   pH 6.0 5.0 - 8.0   Glucose, UA NEGATIVE NEGATIVE mg/dL   Hgb urine dipstick NEGATIVE NEGATIVE   Bilirubin Urine NEGATIVE NEGATIVE   Ketones, ur NEGATIVE NEGATIVE mg/dL   Protein, ur 100 (A) NEGATIVE mg/dL   Nitrite NEGATIVE NEGATIVE   Leukocytes,Ua NEGATIVE NEGATIVE   RBC / HPF 0-5 0 - 5 RBC/hpf   WBC, UA 0-5 0 - 5 WBC/hpf   Bacteria, UA RARE (A) NONE SEEN   Mucus PRESENT     Comment: Performed at [facility], [address] [place], [place] [address]   Dg Chest Portable 1 View  Result Date: 08/29/2018 CLINICAL DATA:  Anemia. EXAM: PORTABLE CHEST 1 VIEW COMPARISON:  Radiographs of April 01, 2018. FINDINGS: Stable cardiomegaly. No pneumothorax or pleural effusion is noted. Sternotomy wires are noted. Both lungs are clear. The visualized skeletal structures are unremarkable. IMPRESSION: No active disease. Electronically Signed   By: Marijo Conception M.D.   On: 08/29/2018 15:47    Pending Labs Unresulted Labs (From admission,  onward)    Start     Ordered   08/29/18 1751  Iron and TIBC  Once,   STAT     08/29/18 1751   08/29/18 1751  Type and screen [facility]  Once,   STAT    Comments: [facility]    08/29/18 1751   08/29/18 1751  Prepare RBC  (Adult Blood Administration - Red Blood Cells)  Once,   R    Question Answer Comment  # of Units 1 unit   Transfusion Indications Symptomatic Anemia   If emergent release call blood bank Not emergent release   Instructions: Transfuse      08/29/18 1751   08/29/18 1750  Ferritin  Once,   STAT     08/29/18 1751   08/29/18 1734  SARS Coronavirus 2 (CEPHEID- Performed in [place] hospital lab), Hosp Order  (Symptomatic Patients Labs with Precautions )  ONCE - STAT,   STAT    Question:  Patient immune status  Answer:  Normal   08/29/18 1734   Signed and Held  HIV antibody (Routine Testing)  Once,   R     Signed and Held   Signed and Held  Basic metabolic panel  Tomorrow morning,   R     Signed and Held   Signed and Held  CBC  Tomorrow morning,   R     Signed and Held          Vitals/Pain Today's  Vitals   08/29/18 1630 08/29/18 1700 08/29/18 1730 08/29/18 1800  BP: (!) 182/93 (!) 182/88 (!) 172/82 (!) 166/95  Pulse: 74 77 73 69  Resp:      Temp:      TempSrc:      SpO2: 100% 96% 96% 98%  Weight:      Height:      PainSc:        Isolation Precautions No active isolations  Medications Medications  0.9 %  sodium chloride infusion (Manually program via Guardrails IV Fluids) (has no administration in time range)    Mobility walks Low fall risk   Focused Assessments   R Recommendations: See Admitting Provider Note  Report given to:   Additional Notes:

## 2018-08-30 ENCOUNTER — Other Ambulatory Visit: Payer: Self-pay

## 2018-08-30 DIAGNOSIS — E1165 Type 2 diabetes mellitus with hyperglycemia: Secondary | ICD-10-CM

## 2018-08-30 DIAGNOSIS — I110 Hypertensive heart disease with heart failure: Secondary | ICD-10-CM | POA: Diagnosis not present

## 2018-08-30 DIAGNOSIS — N185 Chronic kidney disease, stage 5: Secondary | ICD-10-CM

## 2018-08-30 DIAGNOSIS — I10 Essential (primary) hypertension: Secondary | ICD-10-CM

## 2018-08-30 DIAGNOSIS — I5022 Chronic systolic (congestive) heart failure: Secondary | ICD-10-CM | POA: Diagnosis not present

## 2018-08-30 DIAGNOSIS — D472 Monoclonal gammopathy: Secondary | ICD-10-CM

## 2018-08-30 DIAGNOSIS — E1169 Type 2 diabetes mellitus with other specified complication: Secondary | ICD-10-CM | POA: Diagnosis not present

## 2018-08-30 DIAGNOSIS — E1129 Type 2 diabetes mellitus with other diabetic kidney complication: Secondary | ICD-10-CM

## 2018-08-30 HISTORY — DX: Monoclonal gammopathy: D47.2

## 2018-08-30 HISTORY — DX: Chronic kidney disease, stage 5: N18.5

## 2018-08-30 LAB — CBC
HCT: 25.5 % — ABNORMAL LOW (ref 39.0–52.0)
Hemoglobin: 8.4 g/dL — ABNORMAL LOW (ref 13.0–17.0)
MCH: 31.3 pg (ref 26.0–34.0)
MCHC: 32.9 g/dL (ref 30.0–36.0)
MCV: 95.1 fL (ref 80.0–100.0)
Platelets: 203 10*3/uL (ref 150–400)
RBC: 2.68 MIL/uL — ABNORMAL LOW (ref 4.22–5.81)
RDW: 12.5 % (ref 11.5–15.5)
WBC: 5 10*3/uL (ref 4.0–10.5)
nRBC: 0 % (ref 0.0–0.2)

## 2018-08-30 LAB — TYPE AND SCREEN
ABO/RH(D): A POS
Antibody Screen: NEGATIVE
Unit division: 0

## 2018-08-30 LAB — GLUCOSE, CAPILLARY
Glucose-Capillary: 110 mg/dL — ABNORMAL HIGH (ref 70–99)
Glucose-Capillary: 198 mg/dL — ABNORMAL HIGH (ref 70–99)
Glucose-Capillary: 155 mg/dL — ABNORMAL HIGH (ref 70–99)

## 2018-08-30 LAB — BASIC METABOLIC PANEL
Anion gap: 12 (ref 5–15)
BUN: 85 mg/dL — ABNORMAL HIGH (ref 8–23)
CO2: 23 mmol/L (ref 22–32)
Calcium: 9.3 mg/dL (ref 8.9–10.3)
Chloride: 105 mmol/L (ref 98–111)
Creatinine, Ser: 5.47 mg/dL — ABNORMAL HIGH (ref 0.61–1.24)
GFR calc Af Amer: 11 mL/min — ABNORMAL LOW (ref >60)
GFR calc non Af Amer: 10 mL/min — ABNORMAL LOW (ref >60)
Glucose, Bld: 112 mg/dL — ABNORMAL HIGH (ref 70–99)
Potassium: 4 mmol/L (ref 3.5–5.1)
Sodium: 140 mmol/L (ref 135–145)

## 2018-08-30 LAB — BPAM RBC
Blood Product Expiration Date: 202008142359
ISSUE DATE / TIME: 202007172342
Unit Type and Rh: 6200

## 2018-08-30 LAB — MAGNESIUM: Magnesium: 2.4 mg/dL (ref 1.7–2.4)

## 2018-08-30 MED ORDER — FUROSEMIDE 10 MG/ML IJ SOLN
40.0000 mg | Freq: Once | INTRAMUSCULAR | Status: AC
  Administered 2018-08-30: 40 mg via INTRAVENOUS
  Filled 2018-08-30 (×2): qty 4

## 2018-08-30 MED ORDER — DARBEPOETIN ALFA 40 MCG/0.4ML IJ SOSY
40.0000 ug | PREFILLED_SYRINGE | Freq: Once | INTRAMUSCULAR | Status: AC
  Administered 2018-08-30: 40 ug via SUBCUTANEOUS
  Filled 2018-08-30: qty 0.4

## 2018-08-30 NOTE — Plan of Care (Signed)

## 2018-08-30 NOTE — Plan of Care (Signed)
  Problem: Education: Goal: Knowledge of General Education information will improve Description: Including pain rating scale, medication(s)/side effects and non-pharmacologic comfort measures 08/30/2018 1824 by Angelyn Punt, RN Outcome: Adequate for Discharge 08/30/2018 1414 by Angelyn Punt, RN Outcome: Progressing   Problem: Health Behavior/Discharge Planning: Goal: Ability to manage health-related needs will improve 08/30/2018 1824 by Angelyn Punt, RN Outcome: Adequate for Discharge 08/30/2018 1414 by Angelyn Punt, RN Outcome: Progressing   Problem: Clinical Measurements: Goal: Ability to maintain clinical measurements within normal limits will improve 08/30/2018 1824 by Angelyn Punt, RN Outcome: Adequate for Discharge 08/30/2018 1414 by Angelyn Punt, RN Outcome: Progressing Goal: Will remain free from infection 08/30/2018 1824 by Angelyn Punt, RN Outcome: Adequate for Discharge 08/30/2018 1414 by Angelyn Punt, RN Outcome: Progressing Goal: Diagnostic test results will improve 08/30/2018 1824 by Angelyn Punt, RN Outcome: Adequate for Discharge 08/30/2018 1414 by Angelyn Punt, RN Outcome: Progressing Goal: Respiratory complications will improve 08/30/2018 1824 by Angelyn Punt, RN Outcome: Adequate for Discharge 08/30/2018 1414 by Angelyn Punt, RN Outcome: Progressing Goal: Cardiovascular complication will be avoided 08/30/2018 1824 by Angelyn Punt, RN Outcome: Adequate for Discharge 08/30/2018 1414 by Angelyn Punt, RN Outcome: Progressing   Problem: Activity: Goal: Risk for activity intolerance will decrease 08/30/2018 1824 by Angelyn Punt, RN Outcome: Adequate for Discharge 08/30/2018 1414 by Angelyn Punt, RN Outcome: Progressing   Problem: Nutrition: Goal: Adequate nutrition will be maintained 08/30/2018 1824 by Angelyn Punt, RN Outcome: Adequate for  Discharge 08/30/2018 1414 by Angelyn Punt, RN Outcome: Progressing   Problem: Coping: Goal: Level of anxiety will decrease 08/30/2018 1824 by Angelyn Punt, RN Outcome: Adequate for Discharge 08/30/2018 1414 by Angelyn Punt, RN Outcome: Progressing   Problem: Elimination: Goal: Will not experience complications related to bowel motility 08/30/2018 1824 by Angelyn Punt, RN Outcome: Adequate for Discharge 08/30/2018 1414 by Angelyn Punt, RN Outcome: Progressing Goal: Will not experience complications related to urinary retention 08/30/2018 1824 by Angelyn Punt, RN Outcome: Adequate for Discharge 08/30/2018 1414 by Angelyn Punt, RN Outcome: Progressing   Problem: Pain Managment: Goal: General experience of comfort will improve 08/30/2018 1824 by Angelyn Punt, RN Outcome: Adequate for Discharge 08/30/2018 1414 by Angelyn Punt, RN Outcome: Progressing   Problem: Safety: Goal: Ability to remain free from injury will improve 08/30/2018 1824 by Angelyn Punt, RN Outcome: Adequate for Discharge 08/30/2018 1414 by Angelyn Punt, RN Outcome: Progressing   Problem: Skin Integrity: Goal: Risk for impaired skin integrity will decrease 08/30/2018 1824 by Angelyn Punt, RN Outcome: Adequate for Discharge 08/30/2018 1414 by Angelyn Punt, RN Outcome: Progressing

## 2018-08-30 NOTE — Plan of Care (Signed)
  Problem: Skin Integrity: Goal: Risk for impaired skin integrity will decrease Outcome: Progressing   Problem: Safety: Goal: Ability to remain free from injury will improve Outcome: Progressing   Problem: Pain Managment: Goal: General experience of comfort will improve Outcome: Progressing   

## 2018-08-30 NOTE — Progress Notes (Signed)
[facility] called.  Pt had a 15 beat run of VT 08/29/2018 @ 2044.  [facility] notified.  Magnesium level ordered for AM.  Pt has history of NSVT. Will continue to monitor.

## 2018-08-30 NOTE — Progress Notes (Signed)
Discharge instructions given to patient with stated understanding. VSS, no complaints. IV removed, slight bleeding, pressure held for 4 minutes then dressing applied. Pt calling wife to come pick him up.

## 2018-08-30 NOTE — Discharge Summary (Signed)
Physician Discharge Summary  Henry Carroll UVO:[id number] DOB: [birth year] DOA: 08/29/2018  PCP: [name], NP  Admit date: 08/29/2018 Discharge date: 08/30/2018  Admitted From: Home Disposition: Home  Recommendations for Outpatient Follow-up:  1. Follow up with PCP in 1-2 weeks 2. Please obtain BMP/CBC in one week 3. Follow-up with hematology next week to discuss iron infusions 4. Follow-up with nephrology on Tuesday as previously scheduled.  Consider discussing Aranesp injections   Discharge Condition: Stable CODE STATUS: Full code Diet recommendation: Heart healthy  Brief/Interim Summary: 68 year old male with a history of chronic kidney disease stage V, diabetes, hypertension, chronic systolic congestive heart failure, was sent to the emergency room when he was found to have a hemoglobin of 8.  His baseline hemoglobin is 10.  Patient reported that he was feeling increasingly tired, was short of breath.  He did not have any chest pain.  He has chronic lower extremity edema that he felt was slightly increased.  He did not have any dark stools or bloody stools.  He was admitted to the hospital where stool for occult blood was found to be negative.  He did not have any obvious bleeding.  He received 1 unit of PRBC and reports improvement of his symptoms.  Overall fatigue is better.  He does not feel short of breath.  He received a dose of intravenous Lasix.  Since he does have some volume overload, he was given a second dose of intravenous Lasix.  He reports running out of his home dose of Lasix for approximately 3 days after which many of his symptoms started.  He has been advised to continue on his home dose of Lasix.  He will follow-up with his nephrologist on Tuesday.  After receiving a unit of PRBC, hemoglobin minimally improved from 8-8.4.  He also received a dose of Aranesp.  This can be further followed up by his primary nephrologist.  He has been advised to follow-up with his  hematologist to consider further iron infusions.  Patient is feeling significantly better and wants to go home.  He is able to ambulate without any difficulty and able to lay flat in bed without shortness of breath.  He is otherwise stable for discharge.  Discharge Diagnoses:  Active Problems:   DM type 2, uncontrolled, with renal complications (HCC)   Essential hypertension   Chronic systolic heart failure (HCC)   Hypertensive heart disease with heart failure (HCC)   Iron deficiency anemia   Symptomatic anemia   CKD (chronic kidney disease), stage V (HCC)   MGUS (monoclonal gammopathy of unknown significance)    Discharge Instructions  Discharge Instructions    Diet - low sodium heart healthy   Complete by: As directed    Increase activity slowly   Complete by: As directed      Allergies as of 08/30/2018   No Known Allergies     Medication List    TAKE these medications   acetaminophen 500 MG tablet Commonly known as: TYLENOL Take 1,000 mg by mouth 2 (two) times daily as needed for moderate pain.   amLODipine 10 MG tablet Commonly known as: NORVASC Take 1 tablet (10 mg total) by mouth daily.   aspirin EC 81 MG tablet Take 1 tablet (81 mg total) by mouth daily.   atorvastatin 40 MG tablet Commonly known as: LIPITOR TAKE 1 TABLET BY MOUTH EVERY EVENING   carvedilol 25 MG tablet Commonly known as: COREG Take 25 mg by mouth 2 (two) times daily  with a meal.   Cholecalciferol 25 MCG (1000 UT) capsule Commonly known as: [employer] Vitamin D3 Take 2 capsules (2,000 Units total) by mouth daily.   Cinnamon 500 MG capsule Take 1,000 mg by mouth 2 (two) times daily.   CO Q 10 PO Take 200 mg by mouth daily.   Flonase 50 MCG/ACT nasal spray Generic drug: fluticasone Place 2 sprays into both nostrils daily as needed for allergies or rhinitis (SEASONAL ALLERGIES).   Fluticasone-Salmeterol 100-50 MCG/DOSE Aepb Commonly known as: Advair Diskus Inhale 1 puff into the lungs 2  (two) times daily.   furosemide 40 MG tablet Commonly known as: LASIX TAKE 1 & 1/2 (ONE & ONE-HALF) TABLETS BY MOUTH TWICE DAILY What changed: See the new instructions.   glucose blood test strip Commonly known as: ONE TOUCH ULTRA TEST Use to test blood sugar three times daily. Dx: E11.21   hydrALAZINE 100 MG tablet Commonly known as: APRESOLINE Take 100 mg by mouth 2 (two) times daily.   insulin detemir 100 UNIT/ML injection Commonly known as: LEVEMIR Inject 30 Units into the skin daily as needed.   isosorbide mononitrate 30 MG 24 hr tablet Commonly known as: IMDUR Take 1 tablet (30 mg total) by mouth daily.   montelukast 10 MG tablet Commonly known as: SINGULAIR Take 1 tablet (10 mg total) by mouth at bedtime.   nitroGLYCERIN 0.4 MG SL tablet Commonly known as: NITROSTAT Place 1 tablet (0.4 mg total) under the tongue every 5 (five) minutes as needed for chest pain.   One-A-Day Mens 50+ Advantage Tabs Take 1 tablet by mouth daily.   ProAir HFA 108 (90 Base) MCG/ACT inhaler Generic drug: albuterol INHALE 2 PUFFS INTO LUNGS EVERY 6 HOURS AS NEEDED FOR SHORTNESS OF BREATH   albuterol (2.5 MG/3ML) 0.083% nebulizer solution Commonly known as: PROVENTIL USE ONE VIAL IN NEBULIZER EVERY 6 HOURS AS NEEDED FOR WHEEZING FOR SHORTNESS OF BREATH       No Known Allergies  Consultations:     Procedures/Studies: Dg Chest Portable 1 View  Result Date: 08/29/2018 CLINICAL DATA:  Anemia. EXAM: PORTABLE CHEST 1 VIEW COMPARISON:  Radiographs of April 01, 2018. FINDINGS: Stable cardiomegaly. No pneumothorax or pleural effusion is noted. Sternotomy wires are noted. Both lungs are clear. The visualized skeletal structures are unremarkable. IMPRESSION: No active disease. Electronically Signed   By: Marijo Conception M.D.   On: 08/29/2018 15:47      Subjective: Patient is feeling better today.  Overall shortness of breath is better.  He is not feeling as tired.  He has not  noticed any bleeding.  Discharge Exam: Vitals:   08/30/18 0230 08/30/18 0551 08/30/18 1504 08/30/18 1800  BP: (!) 177/95 (!) 178/90 (!) 142/67 (!) 165/87  Pulse: 74 78 71 70  Resp: 18 18 16 16   Temp: 98.1 F (36.7 C) 98.7 F (37.1 C) 98 F (36.7 C) 98.1 F (36.7 C)  TempSrc: Oral  Oral Tympanic  SpO2: 95% 99% 97% 100%  Weight:      Height:        General: Pt is alert, awake, not in acute distress Cardiovascular: RRR, S1/S2 +, no rubs, no gallops Respiratory: CTA bilaterally, no wheezing, no rhonchi Abdominal: Soft, NT, ND, bowel sounds + Extremities: 1+ edema, no cyanosis    The results of significant diagnostics from this hospitalization (including imaging, microbiology, ancillary and laboratory) are listed below for reference.     Microbiology: Recent Results (from the past 240 hour(s))  SARS Coronavirus 2 (  CEPHEID- Performed in [facility] hospital lab), Hosp Order     Status: None   Collection Time: 08/29/18  5:34 PM   Specimen: Nasopharyngeal Swab  Result Value Ref Range Status   SARS Coronavirus 2 NEGATIVE NEGATIVE Final    Comment: (NOTE) If result is NEGATIVE SARS-CoV-2 target nucleic acids are NOT DETECTED. The SARS-CoV-2 RNA is generally detectable in upper and lower  respiratory specimens during the acute phase of infection. The lowest  concentration of SARS-CoV-2 viral copies this assay can detect is 250  copies / mL. A negative result does not preclude SARS-CoV-2 infection  and should not be used as the sole basis for treatment or other  patient management decisions.  A negative result may occur with  improper specimen collection / handling, submission of specimen other  than nasopharyngeal swab, presence of viral mutation(s) within the  areas targeted by this assay, and inadequate number of viral copies  (<250 copies / mL). A negative result must be combined with clinical  observations, patient history, and epidemiological information. If result is  POSITIVE SARS-CoV-2 target nucleic acids are DETECTED. The SARS-CoV-2 RNA is generally detectable in upper and lower  respiratory specimens dur ing the acute phase of infection.  Positive  results are indicative of active infection with SARS-CoV-2.  Clinical  correlation with patient history and other diagnostic information is  necessary to determine patient infection status.  Positive results do  not rule out bacterial infection or co-infection with other viruses. If result is PRESUMPTIVE POSTIVE SARS-CoV-2 nucleic acids MAY BE PRESENT.   A presumptive positive result was obtained on the submitted specimen  and confirmed on repeat testing.  While 2019 novel coronavirus  (SARS-CoV-2) nucleic acids may be present in the submitted sample  additional confirmatory testing may be necessary for epidemiological  and / or clinical management purposes  to differentiate between  SARS-CoV-2 and other Sarbecovirus currently known to infect humans.  If clinically indicated additional testing with an alternate test  methodology [phone]) is advised. The SARS-CoV-2 RNA is generally  detectable in upper and lower respiratory sp ecimens during the acute  phase of infection. The expected result is Negative. Fact Sheet for Patients:  StrictlyIdeas.no Fact Sheet for Healthcare Providers: BankingDealers.co.za This test is not yet approved or cleared by the [place] FDA and has been authorized for detection and/or diagnosis of SARS-CoV-2 by FDA under an Emergency Use Authorization (EUA).  This EUA will remain in effect (meaning this test can be used) for the duration of the COVID-19 declaration under Section 564(b)(1) of the Act, 21 U.S.C. section 360bbb-3(b)(1), unless the authorization is terminated or revoked sooner. Performed at [facility], [address] [place], [place] [address]      Labs: BNP (last 3 results) No results for input(s): BNP  in the last 8760 hours. Basic Metabolic Panel: Recent Labs  Lab 08/28/18 1036 08/29/18 1517 08/30/18 0517  NA 139 139 140  K 4.4 4.3 4.0  CL 104 105 105  CO2 25 24 23   GLUCOSE 78 82 112*  BUN 83* 87* 85*  CREATININE 5.34* 5.73* 5.47*  CALCIUM 9.2 9.2 9.3  MG  --   --  2.4   Liver Function Tests: Recent Labs  Lab 08/28/18 1036 08/29/18 1517  AST 16 27  ALT 11 19  ALKPHOS  --  43  BILITOT 0.3 0.6  PROT 6.0* 6.4*  ALBUMIN  --  3.5   No results for input(s): LIPASE, AMYLASE in the last 168  hours. No results for input(s): AMMONIA in the last 168 hours. CBC: Recent Labs  Lab 08/29/18 1517 08/30/18 0517  WBC 4.6 5.0  NEUTROABS 2.9  --   HGB 8.1* 8.4*  HCT 24.1* 25.5*  MCV 95.6 95.1  PLT 223 203   Cardiac Enzymes: No results for input(s): CKTOTAL, CKMB, CKMBINDEX, TROPONINI in the last 168 hours. BNP: Invalid input(s): POCBNP CBG: Recent Labs  Lab 08/29/18 2104 08/29/18 2158 08/30/18 0727 08/30/18 1114 08/30/18 1609  GLUCAP 57* 109* 110* 198* 155*   D-Dimer No results for input(s): DDIMER in the last 72 hours. Hgb A1c Recent Labs    08/28/18 1036  HGBA1C 7.1*   Lipid Profile Recent Labs    08/28/18 1036  CHOL 151  HDL 51  LDLCALC 80  TRIG 104  CHOLHDL 3.0   Thyroid function studies No results for input(s): TSH, T4TOTAL, T3FREE, THYROIDAB in the last 72 hours.  Invalid input(s): FREET3 Anemia work up Recent Labs    08/29/18 1517  FERRITIN 181  TIBC 314  IRON 43*   Urinalysis    Component Value Date/Time   COLORURINE STRAW (A) 08/29/2018 1700   APPEARANCEUR CLEAR 08/29/2018 1700   LABSPEC 1.011 08/29/2018 1700   PHURINE 6.0 08/29/2018 1700   GLUCOSEU NEGATIVE 08/29/2018 1700   HGBUR NEGATIVE 08/29/2018 1700   BILIRUBINUR NEGATIVE 08/29/2018 1700   KETONESUR NEGATIVE 08/29/2018 1700   PROTEINUR 100 (A) 08/29/2018 1700   NITRITE NEGATIVE 08/29/2018 1700   LEUKOCYTESUR NEGATIVE 08/29/2018 1700   Sepsis Labs Invalid input(s):  PROCALCITONIN,  WBC,  LACTICIDVEN Microbiology Recent Results (from the past 240 hour(s))  SARS Coronavirus 2 (CEPHEID- Performed in [facility] hospital lab), Hosp Order     Status: None   Collection Time: 08/29/18  5:34 PM   Specimen: Nasopharyngeal Swab  Result Value Ref Range Status   SARS Coronavirus 2 NEGATIVE NEGATIVE Final    Comment: (NOTE) If result is NEGATIVE SARS-CoV-2 target nucleic acids are NOT DETECTED. The SARS-CoV-2 RNA is generally detectable in upper and lower  respiratory specimens during the acute phase of infection. The lowest  concentration of SARS-CoV-2 viral copies this assay can detect is 250  copies / mL. A negative result does not preclude SARS-CoV-2 infection  and should not be used as the sole basis for treatment or other  patient management decisions.  A negative result may occur with  improper specimen collection / handling, submission of specimen other  than nasopharyngeal swab, presence of viral mutation(s) within the  areas targeted by this assay, and inadequate number of viral copies  (<250 copies / mL). A negative result must be combined with clinical  observations, patient history, and epidemiological information. If result is POSITIVE SARS-CoV-2 target nucleic acids are DETECTED. The SARS-CoV-2 RNA is generally detectable in upper and lower  respiratory specimens dur ing the acute phase of infection.  Positive  results are indicative of active infection with SARS-CoV-2.  Clinical  correlation with patient history and other diagnostic information is  necessary to determine patient infection status.  Positive results do  not rule out bacterial infection or co-infection with other viruses. If result is PRESUMPTIVE POSTIVE SARS-CoV-2 nucleic acids MAY BE PRESENT.   A presumptive positive result was obtained on the submitted specimen  and confirmed on repeat testing.  While 2019 novel coronavirus  (SARS-CoV-2) nucleic acids may be present in the  submitted sample  additional confirmatory testing may be necessary for epidemiological  and / or clinical management purposes  to differentiate between  SARS-CoV-2 and other Sarbecovirus currently known to infect humans.  If clinically indicated additional testing with an alternate test  methodology [phone]) is advised. The SARS-CoV-2 RNA is generally  detectable in upper and lower respiratory sp ecimens during the acute  phase of infection. The expected result is Negative. Fact Sheet for Patients:  StrictlyIdeas.no Fact Sheet for Healthcare Providers: BankingDealers.co.za This test is not yet approved or cleared by the [place] FDA and has been authorized for detection and/or diagnosis of SARS-CoV-2 by FDA under an Emergency Use Authorization (EUA).  This EUA will remain in effect (meaning this test can be used) for the duration of the COVID-19 declaration under Section 564(b)(1) of the Act, 21 U.S.C. section 360bbb-3(b)(1), unless the authorization is terminated or revoked sooner. Performed at [facility], [address], [place], [place] [address]      Time coordinating discharge: 38mins  SIGNED:   Kathie Dike, MD  [facility] 08/30/2018, 8:32 PM   If 7PM-7AM, please contact night-coverage [email or web address]

## 2018-08-31 LAB — HIV ANTIBODY (ROUTINE TESTING W REFLEX): HIV Screen 4th Generation wRfx: NONREACTIVE

## 2018-09-01 ENCOUNTER — Telehealth: Payer: Self-pay | Admitting: *Deleted

## 2018-09-01 NOTE — Telephone Encounter (Signed)
Transition Care Management Follow-up Telephone Call  Date of discharge and from where: 08/29/18-08/30/18 [facility]   How have you been since you were released from the hospital? Good, had to get some blood  Any questions or concerns? No   Items Reviewed:  Did the pt receive and understand the discharge instructions provided? Yes   Medications obtained and verified? Yes   Any new allergies since your discharge? No   Dietary orders reviewed? Yes  Do you have support at home? Yes   Other (ie: DME, Home Health, etc) No Home Health  Functional Questionnaire: (I = Independent and D = Dependent) ADL's: I  Bathing/Dressing- I   Meal Prep- I  Eating- I  Maintaining continence- I  Transferring/Ambulation- I  Managing Meds- I   Follow up appointments reviewed:    PCP Hospital f/u appt confirmed? Yes  Scheduled to see Janett Billow on 09/02/18   Specialist Hospital f/u appt confirmed? No    Are transportation arrangements needed? No   If their condition worsens, is the pt aware to call  their PCP or go to the ED? Yes  Was the patient provided with contact information for the PCP's office or ED? Yes  Was the pt encouraged to call back with questions or concerns? Yes

## 2018-09-02 ENCOUNTER — Other Ambulatory Visit: Payer: Self-pay

## 2018-09-02 ENCOUNTER — Ambulatory Visit (INDEPENDENT_AMBULATORY_CARE_PROVIDER_SITE_OTHER): Payer: Medicare Other | Admitting: Nurse Practitioner

## 2018-09-02 ENCOUNTER — Encounter: Payer: Self-pay | Admitting: Nurse Practitioner

## 2018-09-02 VITALS — BP 140/80 | HR 63 | Temp 98.2°F | Ht 74.0 in | Wt 229.0 lb

## 2018-09-02 DIAGNOSIS — I2583 Coronary atherosclerosis due to lipid rich plaque: Secondary | ICD-10-CM

## 2018-09-02 DIAGNOSIS — I5042 Chronic combined systolic (congestive) and diastolic (congestive) heart failure: Secondary | ICD-10-CM

## 2018-09-02 DIAGNOSIS — I2510 Atherosclerotic heart disease of native coronary artery without angina pectoris: Secondary | ICD-10-CM

## 2018-09-02 DIAGNOSIS — G4733 Obstructive sleep apnea (adult) (pediatric): Secondary | ICD-10-CM | POA: Diagnosis not present

## 2018-09-02 DIAGNOSIS — D631 Anemia in chronic kidney disease: Secondary | ICD-10-CM | POA: Diagnosis not present

## 2018-09-02 DIAGNOSIS — N529 Male erectile dysfunction, unspecified: Secondary | ICD-10-CM | POA: Diagnosis not present

## 2018-09-02 DIAGNOSIS — E1165 Type 2 diabetes mellitus with hyperglycemia: Secondary | ICD-10-CM

## 2018-09-02 DIAGNOSIS — E1129 Type 2 diabetes mellitus with other diabetic kidney complication: Secondary | ICD-10-CM | POA: Diagnosis not present

## 2018-09-02 DIAGNOSIS — J45909 Unspecified asthma, uncomplicated: Secondary | ICD-10-CM | POA: Diagnosis not present

## 2018-09-02 DIAGNOSIS — IMO0002 Reserved for concepts with insufficient information to code with codable children: Secondary | ICD-10-CM

## 2018-09-02 DIAGNOSIS — N185 Chronic kidney disease, stage 5: Secondary | ICD-10-CM | POA: Diagnosis not present

## 2018-09-02 NOTE — Progress Notes (Signed)
Careteam: Patient Care Team: [name], NP as PCP - General (Geriatric Medicine) Jerline Pain, MD as PCP - Cardiology (Cardiology) Jerline Pain, MD as Consulting Physician (Cardiology) Fleet Contras, MD as Consulting Physician (Nephrology) Rutherford Guys, MD as Consulting Physician (Ophthalmology) [name], MD as Consulting Physician (Gastroenterology)  Advanced Directive information    No Known Allergies  Chief Complaint  Patient presents with  . [facility] follow-up 08/29/2018-08/30/2018 for anemia. Discuss labs (copy printed).   . Referral    referral request to Urologist      HPI: Patient is a 68 y.o. male seen in the office today to follow up hospitalization. Reports he has had symptomatic anemia in the past, previously hgb went as low as 8.7. he was admitted with a hgb of 8 on 08/29/2018 and given 1 unit PRBC and follow up with hematology and nephrologist. He had cancelled last appt with hematologist due to [place] but plans to follow up now.  Plans to see Dr Posey Pronto tomorrow.  He got injection on Aranesp during hospitalization.  Today he reports he is still doing well. Shortness of breath and fatigue have improved. He was discharged 3 days ago.   Dm- A1c continues at 7.1, no hypoglycemic noted, continues on levemir 30 units.   HTN- blood pressure high in hospital. Blood pressure ranging 140-150/70-80, following with cardiologist and nephrologist regarding blood pressure.   CHF/CAD- followed closely by cardiology- echo scheduled in September, no chest pains noted, continues on isosorbide and hydralazine. Continues on lasix and coreg twice daily  Asthma- mild rare symptoms, uses advair PRN only- unsure when he last used. Uses albuterol and neb as needed but only will need occasionally not routine. Used to smoke but does not now and has not needed medication.  OSA- uses cpap, but does not like it.   MGUS- following with hematology   Review of Systems:  Review of Systems  Constitutional: Negative for chills, fever and weight loss.  HENT: Negative for tinnitus.   Respiratory: Negative for cough, sputum production and shortness of breath.   Cardiovascular: Positive for leg swelling. Negative for chest pain and palpitations.  Gastrointestinal: Negative for abdominal pain, constipation, diarrhea and heartburn.  Genitourinary: Negative for dysuria, frequency and urgency.  Musculoskeletal: Negative for back pain, falls, joint pain and myalgias.  Skin: Negative.   Neurological: Negative for dizziness and headaches.  Psychiatric/Behavioral: Negative for depression and memory loss. The patient does not have insomnia.     Past Medical History:  Diagnosis Date  . Anemia   . 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 Asthma   . Chronic combined systolic (congestive) and diastolic (congestive) heart failure ([place])    a. 12/31/14: 2D ECHO: EF 40-45%, HK of inf myocardium, G1DD, mod MR  . CKD (chronic kidney disease), stage III (HCC)    stage 3 kidney disease  . Coronary artery disease    a. LHC 01/2015 - triple vessel CAD (mod oLM, mLAD, severe mRCA, intermediate branch stenosis, CTO of mCx). Plan CABG 02/2015.  Marland Kitchen GERD (gastroesophageal reflux disease)   . Hyperlipidemia   . Hypertension   . Mitral regurgitation    a. Mild-mod by echo 12/2014.  Marland Kitchen Myocardial infarction [facility])    pt. states per [name] he has in the past  . NSVT (nonsustained ventricular tachycardia) ([place])    a. 9 beats during 01/2015 adm. BB titrated.  . Obesity  a. BMI 33  . OSA (obstructive sleep apnea) 01/16/2018   Mild obstructive sleep apnea overall with an AHI of 7.3/h and no significant central sleep apnea. Severe obstructive sleep apnea during REM sleep with an AHI of 34.3/h.  Now on CPAP at 6 cm H2O.   . Sleep apnea    2019, Dr.Turner diagnoised   . Type II diabetes mellitus ([place])    Past Surgical History:  Procedure Laterality Date  . BACK  SURGERY    . CARDIAC CATHETERIZATION N/A 02/09/2015   Procedure: Left Heart Cath and Coronary Angiography;  Surgeon: Burnell Blanks, MD;  Location: [facility];  Service: Cardiovascular;  Laterality: N/A;  . COLONOSCOPY  2011   Dr. [name]: Ileocecal valve appeared normal, scattered pancolonic diverticulosis, difficult bowel prep making smaller lesions potentially missed. Recommended three-year follow-up colonoscopy.  . COLONOSCOPY  2003   Dr. [name]: Suspicious lesion at the ileocecal valve, multiple biopsies benign, pancolonic diverticulosis.  . COLONOSCOPY N/A 02/15/2016   diverticulosis in sigmoid and descending colon, single 5 mm polyp at splenic flexure (Tubular adenoma)  . CORONARY ARTERY BYPASS GRAFT N/A 02/18/2015   Procedure: CORONARY ARTERY BYPASS GRAFTING (CABG) x  four, using bilateral internal mammary arteries and right leg greater saphenous vein harvested endoscopically;  Surgeon: [name], MD;  Location: [facility];  Service: Open Heart Surgery;  Laterality: N/A;  . ESOPHAGOGASTRODUODENOSCOPY N/A 02/15/2016   normal  . EYE SURGERY Right    July 2019   . GIVENS CAPSULE STUDY N/A 01/08/2017   couple of gastric and small bowel eroions in setting of aspirin 81 mg daily but nothing concerning, continue Hematology follow-up  . HERNIA REPAIR  6283   Umbilical  . LUMBAR [place] SURGERY  2006   L4 & L5  . REFRACTIVE SURGERY Left    2019   . REFRACTIVE SURGERY Right    2019  . TEE WITHOUT CARDIOVERSION N/A 02/18/2015   Procedure: TRANSESOPHAGEAL ECHOCARDIOGRAM (TEE);  Surgeon: [name], MD;  Location: [facility];  Service: Open Heart Surgery;  Laterality: N/A;  . TONSILLECTOMY  1962   Social History:   reports that he quit smoking about 3 years ago. His smoking use included cigars. He quit after 17.00 years of use. He has never used smokeless tobacco. He reports current alcohol use. He reports that he does not use drugs.  Family History  Problem Relation Age of Onset  .  Diabetes Mother   . Heart disease Mother        later in life, age >39  . Heart disease Father        heart failure later in life  . Hypertension Father   . Stroke Sister   . Kidney disease Daughter   . Sudden death Daughter   . Colon cancer Paternal Uncle        Passed from colon CA  . Heart attack Neg Hx     Medications: Patient's Medications  New Prescriptions   No medications on file  Previous Medications   ACETAMINOPHEN (TYLENOL) 500 MG TABLET    Take 1,000 mg by mouth 2 (two) times daily as needed for moderate pain.   ALBUTEROL (PROVENTIL) (2.5 MG/3ML) 0.083% NEBULIZER SOLUTION    USE ONE VIAL IN NEBULIZER EVERY 6 HOURS AS NEEDED FOR WHEEZING FOR SHORTNESS OF BREATH   AMLODIPINE (NORVASC) 10 MG TABLET    Take 1 tablet (10 mg total) by mouth daily.   ASPIRIN EC 81 MG TABLET  Take 1 tablet (81 mg total) by mouth daily.   ATORVASTATIN (LIPITOR) 40 MG TABLET    TAKE 1 TABLET BY MOUTH EVERY EVENING   CARVEDILOL (COREG) 25 MG TABLET    Take 25 mg by mouth 2 (two) times daily with a meal.   CHOLECALCIFEROL ([employer] VITAMIN D3) 1000 UNITS CAPSULE    Take 2 capsules (2,000 Units total) by mouth daily.   CINNAMON 500 MG CAPSULE    Take 1,000 mg by mouth 2 (two) times daily.    COENZYME Q10 (CO Q 10 PO)    Take 200 mg by mouth daily.    FLUTICASONE (FLONASE) 50 MCG/ACT NASAL SPRAY    Place 2 sprays into both nostrils daily as needed for allergies or rhinitis (SEASONAL ALLERGIES).   FLUTICASONE-SALMETEROL (ADVAIR DISKUS) 100-50 MCG/DOSE AEPB    Inhale 1 puff into the lungs 2 (two) times daily.   FUROSEMIDE (LASIX) 40 MG TABLET    TAKE 1 & 1/2 (ONE & ONE-HALF) TABLETS BY MOUTH TWICE DAILY   GLUCOSE BLOOD (ONE TOUCH ULTRA TEST) TEST STRIP    Use to test blood sugar three times daily. Dx: E11.21   HYDRALAZINE (APRESOLINE) 100 MG TABLET    Take 100 mg by mouth 2 (two) times daily.   INSULIN DETEMIR (LEVEMIR) 100 UNIT/ML INJECTION    Inject 30 Units into the skin daily as needed.    ISOSORBIDE  MONONITRATE (IMDUR) 30 MG 24 HR TABLET    Take 1 tablet (30 mg total) by mouth daily.   MONTELUKAST (SINGULAIR) 10 MG TABLET    Take 1 tablet (10 mg total) by mouth at bedtime.   MULTIPLE VITAMINS-MINERALS (ONE-A-DAY MENS 50+ ADVANTAGE) TABS    Take 1 tablet by mouth daily.   NITROGLYCERIN (NITROSTAT) 0.4 MG SL TABLET    Place 1 tablet (0.4 mg total) under the tongue every 5 (five) minutes as needed for chest pain.   PROAIR HFA 108 (90 BASE) MCG/ACT INHALER    INHALE 2 PUFFS INTO LUNGS EVERY 6 HOURS AS NEEDED FOR SHORTNESS OF BREATH  Modified Medications   No medications on file  Discontinued Medications   No medications on file    Physical Exam:  Vitals:   09/02/18 1033  BP: 140/80  Pulse: 63  Temp: 98.2 F (36.8 C)  TempSrc: Oral  SpO2: 96%  Weight: 229 lb (103.9 kg)  Height: 6\' 2"  (1.88 m)   Body mass index is 29.4 kg/m. Wt Readings from Last 3 Encounters:  09/02/18 229 lb (103.9 kg)  08/29/18 215 lb (97.5 kg)  07/08/18 218 lb (98.9 kg)    Physical Exam Constitutional:      Appearance: He is well-developed.  HENT:     Head: Normocephalic and atraumatic.  Eyes:     General: No scleral icterus.    Pupils: Pupils are equal, round, and reactive to light.  Neck:     Musculoskeletal: Neck supple.     Thyroid: No thyromegaly.     Vascular: No carotid bruit.  Cardiovascular:     Rate and Rhythm: Normal rate and regular rhythm.     Heart sounds: Murmur (1/6 SEM) present. No friction rub. No gallop.   Pulmonary:     Effort: Pulmonary effort is normal.     Breath sounds: Normal breath sounds. No wheezing or rales.  Chest:     Chest wall: No tenderness.  Abdominal:     General: Bowel sounds are normal. There is no distension or abdominal bruit.  Palpations: Abdomen is soft. There is no hepatomegaly, mass or pulsatile mass.     Tenderness: There is no abdominal tenderness. There is no guarding or rebound.     Comments: obese  Musculoskeletal:     Right lower leg:  Edema present.     Left lower leg: Edema present.     Comments: 2+ bilateral edema  Lymphadenopathy:     Cervical: No cervical adenopathy.  Skin:    General: Skin is warm and dry.     Findings: No rash.  Neurological:     Mental Status: He is alert and oriented to person, place, and time.     Deep Tendon Reflexes: Reflexes are normal and symmetric.  Psychiatric:        Behavior: Behavior normal.        Thought Content: Thought content normal.        Judgment: Judgment normal.     Labs reviewed: Basic Metabolic Panel: Recent Labs    08/28/18 1036 08/29/18 1517 08/30/18 0517  NA 139 139 140  K 4.4 4.3 4.0  CL 104 105 105  CO2 25 24 23   GLUCOSE 78 82 112*  BUN 83* 87* 85*  CREATININE 5.34* 5.73* 5.47*  CALCIUM 9.2 9.2 9.3  MG  --   --  2.4   Liver Function Tests: Recent Labs    02/10/18 1311 04/01/18 1019 08/28/18 1036 08/29/18 1517  AST 24 25 16 27   ALT 21 16 11 19   ALKPHOS 52 70  --  43  BILITOT 0.5 <0.2 0.3 0.6  PROT 5.9* 5.8* 6.0* 6.4*  ALBUMIN 3.1* 3.7*  --  3.5   No results for input(s): LIPASE, AMYLASE in the last 8760 hours. No results for input(s): AMMONIA in the last 8760 hours. CBC: Recent Labs    09/16/17 1512 02/10/18 1311  04/01/18 1019 08/29/18 1517 08/30/18 0517  WBC 4.0 4.1  --  3.8 4.6 5.0  NEUTROABS 2.5 2.7  --   --  2.9  --   HGB 10.6* 10.0*   < > 10.0* 8.1* 8.4*  HCT 31.5* 30.5*   < > 28.6* 24.1* 25.5*  MCV 92.1 96.5  --  96 95.6 95.1  PLT 257 239  --  290 223 203   < > = values in this interval not displayed.   Lipid Panel: Recent Labs    02/27/18 1358 08/28/18 1036  CHOL 180 151  HDL 42 51  LDLCALC 113* 80  TRIG 132 104  CHOLHDL 4.3 3.0   TSH: No results for input(s): TSH in the last 8760 hours. A1C: Lab Results  Component Value Date   HGBA1C 7.1 (H) 08/28/2018     Assessment/Plan 1. Chronic combined systolic and diastolic CHF (congestive heart failure) ([place]) Stable; he does have some fluid retention since  hospitalization but without shortness of breath cough or congestion. He plans to see nephrologist tomorrow who will follow up his labs and adjust his diuretic, states he will occasionally retain fluid and nephrologist has been managing. Will continue current regimen as he is seeing specialist tomorrow.   2. Coronary artery disease due to lipid rich plaque Stable, without chest pains   3. Chronic asthma, unspecified asthma severity, unspecified whether complicated, unspecified whether persistent Stable, improved since he stopped smoking, not using inhalers routinely  4. DM type 2, uncontrolled, with renal complications ([place]) -stable at this time, continues to work on dietary modification and reports compliance with levemir. No hypoglycemia noted.  Encouraged  routine  foot care/monitoring and to keep up with diabetic eye exams through ophthalmology    5. CKD (chronic kidney disease), stage V ([place]) Close follow up with nephrology, also being evaluated for transplant at [facility].   6. OSA (obstructive sleep apnea) -encouraged compliance with CPAP  7. Erectile dysfunction, unspecified erectile dysfunction type -would like to discuss alternate treatment for his ED, he has tried Viagra and pump, has done some research online and would like to see what other options are available.  - Ambulatory referral to Urology  8. Anemia due to stage 5 chronic kidney disease, not on chronic dialysis ([place]) Symptomatic and was hospitalized for transfusion, he was given dose of Aranesp and transfused 1 unit PRBC, overall feels much better at this time. following with nephrologist and hematology at this time.    Next appt: 4 months for routine follow up.  Carlos American. [place], [facility] [phone]

## 2018-09-03 DIAGNOSIS — I129 Hypertensive chronic kidney disease with stage 1 through stage 4 chronic kidney disease, or unspecified chronic kidney disease: Secondary | ICD-10-CM | POA: Diagnosis not present

## 2018-09-03 DIAGNOSIS — N184 Chronic kidney disease, stage 4 (severe): Secondary | ICD-10-CM | POA: Diagnosis not present

## 2018-09-03 DIAGNOSIS — N2581 Secondary hyperparathyroidism of renal origin: Secondary | ICD-10-CM | POA: Diagnosis not present

## 2018-09-03 DIAGNOSIS — D649 Anemia, unspecified: Secondary | ICD-10-CM | POA: Diagnosis not present

## 2018-09-07 ENCOUNTER — Other Ambulatory Visit: Payer: Self-pay | Admitting: Cardiology

## 2018-09-09 ENCOUNTER — Other Ambulatory Visit: Payer: Self-pay

## 2018-09-09 ENCOUNTER — Inpatient Hospital Stay (HOSPITAL_COMMUNITY): Payer: Medicare Other | Attending: Hematology

## 2018-09-09 DIAGNOSIS — D472 Monoclonal gammopathy: Secondary | ICD-10-CM | POA: Insufficient documentation

## 2018-09-09 DIAGNOSIS — D509 Iron deficiency anemia, unspecified: Secondary | ICD-10-CM | POA: Diagnosis not present

## 2018-09-09 LAB — CBC WITH DIFFERENTIAL/PLATELET
Abs Immature Granulocytes: 0.02 10*3/uL (ref 0.00–0.07)
Basophils Absolute: 0 10*3/uL (ref 0.0–0.1)
Basophils Relative: 0 %
Eosinophils Absolute: 0.1 10*3/uL (ref 0.0–0.5)
Eosinophils Relative: 2 %
HCT: 25 % — ABNORMAL LOW (ref 39.0–52.0)
Hemoglobin: 8.2 g/dL — ABNORMAL LOW (ref 13.0–17.0)
Immature Granulocytes: 0 %
Lymphocytes Relative: 15 %
Lymphs Abs: 1 10*3/uL (ref 0.7–4.0)
MCH: 31.3 pg (ref 26.0–34.0)
MCHC: 32.8 g/dL (ref 30.0–36.0)
MCV: 95.4 fL (ref 80.0–100.0)
Monocytes Absolute: 0.6 10*3/uL (ref 0.1–1.0)
Monocytes Relative: 9 %
Neutro Abs: 5 10*3/uL (ref 1.7–7.7)
Neutrophils Relative %: 74 %
Platelets: 264 10*3/uL (ref 150–400)
RBC: 2.62 MIL/uL — ABNORMAL LOW (ref 4.22–5.81)
RDW: 12.8 % (ref 11.5–15.5)
WBC: 6.8 10*3/uL (ref 4.0–10.5)
nRBC: 0 % (ref 0.0–0.2)

## 2018-09-09 LAB — IRON AND TIBC
Iron: 21 ug/dL — ABNORMAL LOW (ref 45–182)
Saturation Ratios: 8 % — ABNORMAL LOW (ref 17.9–39.5)
TIBC: 275 ug/dL (ref 250–450)
UIBC: 254 ug/dL

## 2018-09-09 LAB — COMPREHENSIVE METABOLIC PANEL
ALT: 15 U/L (ref 0–44)
AST: 17 U/L (ref 15–41)
Albumin: 3.3 g/dL — ABNORMAL LOW (ref 3.5–5.0)
Alkaline Phosphatase: 46 U/L (ref 38–126)
Anion gap: 10 (ref 5–15)
BUN: 88 mg/dL — ABNORMAL HIGH (ref 8–23)
CO2: 26 mmol/L (ref 22–32)
Calcium: 9 mg/dL (ref 8.9–10.3)
Chloride: 103 mmol/L (ref 98–111)
Creatinine, Ser: 6.11 mg/dL — ABNORMAL HIGH (ref 0.61–1.24)
GFR calc Af Amer: 10 mL/min — ABNORMAL LOW (ref >60)
GFR calc non Af Amer: 9 mL/min — ABNORMAL LOW (ref >60)
Glucose, Bld: 178 mg/dL — ABNORMAL HIGH (ref 70–99)
Potassium: 3.9 mmol/L (ref 3.5–5.1)
Sodium: 139 mmol/L (ref 135–145)
Total Bilirubin: 0.6 mg/dL (ref 0.3–1.2)
Total Protein: 6.4 g/dL — ABNORMAL LOW (ref 6.5–8.1)

## 2018-09-09 LAB — FOLATE: Folate: 23.6 ng/mL (ref >5.9)

## 2018-09-09 LAB — VITAMIN B12: Vitamin B-12: 2196 pg/mL — ABNORMAL HIGH (ref 180–914)

## 2018-09-09 LAB — FERRITIN: Ferritin: 171 ng/mL (ref 24–336)

## 2018-09-10 ENCOUNTER — Encounter (HOSPITAL_COMMUNITY)
Admission: RE | Admit: 2018-09-10 | Discharge: 2018-09-10 | Disposition: A | Discharge status: Home / Self Care | Payer: Medicare Other | Source: Ambulatory Visit | Attending: Nephrology | Admitting: Nephrology

## 2018-09-10 ENCOUNTER — Encounter (HOSPITAL_COMMUNITY): Payer: Self-pay

## 2018-09-10 DIAGNOSIS — D631 Anemia in chronic kidney disease: Secondary | ICD-10-CM | POA: Insufficient documentation

## 2018-09-10 DIAGNOSIS — N184 Chronic kidney disease, stage 4 (severe): Secondary | ICD-10-CM | POA: Insufficient documentation

## 2018-09-10 LAB — POCT HEMOGLOBIN-HEMACUE: Hemoglobin: 7.4 g/dL — ABNORMAL LOW (ref 13.0–17.0)

## 2018-09-10 MED ORDER — SODIUM CHLORIDE 0.9 % IV SOLN
510.0000 mg | Freq: Once | INTRAVENOUS | Status: AC
  Administered 2018-09-10: 510 mg via INTRAVENOUS
  Filled 2018-09-10: qty 510

## 2018-09-10 MED ORDER — EPOETIN ALFA-EPBX 10000 UNIT/ML IJ SOLN
10000.0000 [IU] | Freq: Once | INTRAMUSCULAR | Status: AC
  Administered 2018-09-10: 10000 [IU] via SUBCUTANEOUS

## 2018-09-10 MED ORDER — SODIUM CHLORIDE 0.9 % IV SOLN
Freq: Once | INTRAVENOUS | Status: AC
  Administered 2018-09-10: 12:00:00 via INTRAVENOUS

## 2018-09-10 MED ORDER — EPOETIN ALFA-EPBX 10000 UNIT/ML IJ SOLN
INTRAMUSCULAR | Status: AC
  Filled 2018-09-10: qty 1

## 2018-09-10 NOTE — Progress Notes (Signed)
Patient had  HGB result of 7.4. Notified Dr. Serita Grit office. Was only able to leave message and was never called back. Patient received an iron infusion and 10,000 units of retacrit.

## 2018-09-16 ENCOUNTER — Other Ambulatory Visit: Payer: Self-pay

## 2018-09-16 ENCOUNTER — Inpatient Hospital Stay (HOSPITAL_COMMUNITY): Payer: Medicare Other | Attending: Nurse Practitioner | Admitting: Nurse Practitioner

## 2018-09-16 ENCOUNTER — Ambulatory Visit (HOSPITAL_COMMUNITY)
Admission: RE | Admit: 2018-09-16 | Discharge: 2018-09-16 | Disposition: A | Discharge status: Home / Self Care | Payer: Medicare Other | Source: Ambulatory Visit | Attending: Nurse Practitioner | Admitting: Nurse Practitioner

## 2018-09-16 ENCOUNTER — Inpatient Hospital Stay (HOSPITAL_COMMUNITY): Payer: Medicare Other

## 2018-09-16 VITALS — BP 146/77 | HR 64 | Temp 97.1°F | Resp 16 | Wt 220.1 lb

## 2018-09-16 DIAGNOSIS — D472 Monoclonal gammopathy: Secondary | ICD-10-CM

## 2018-09-16 DIAGNOSIS — R600 Localized edema: Secondary | ICD-10-CM | POA: Diagnosis not present

## 2018-09-16 DIAGNOSIS — D509 Iron deficiency anemia, unspecified: Secondary | ICD-10-CM | POA: Diagnosis not present

## 2018-09-16 DIAGNOSIS — D508 Other iron deficiency anemias: Secondary | ICD-10-CM | POA: Diagnosis not present

## 2018-09-16 LAB — CBC WITH DIFFERENTIAL/PLATELET
Abs Immature Granulocytes: 0.02 10*3/uL (ref 0.00–0.07)
Basophils Absolute: 0 10*3/uL (ref 0.0–0.1)
Basophils Relative: 0 %
Eosinophils Absolute: 0.1 10*3/uL (ref 0.0–0.5)
Eosinophils Relative: 2 %
HCT: 25.2 % — ABNORMAL LOW (ref 39.0–52.0)
Hemoglobin: 8.1 g/dL — ABNORMAL LOW (ref 13.0–17.0)
Immature Granulocytes: 0 %
Lymphocytes Relative: 10 %
Lymphs Abs: 0.6 10*3/uL — ABNORMAL LOW (ref 0.7–4.0)
MCH: 31 pg (ref 26.0–34.0)
MCHC: 32.1 g/dL (ref 30.0–36.0)
MCV: 96.6 fL (ref 80.0–100.0)
Monocytes Absolute: 0.7 10*3/uL (ref 0.1–1.0)
Monocytes Relative: 11 %
Neutro Abs: 4.5 10*3/uL (ref 1.7–7.7)
Neutrophils Relative %: 77 %
Platelets: 270 10*3/uL (ref 150–400)
RBC: 2.61 MIL/uL — ABNORMAL LOW (ref 4.22–5.81)
RDW: 13.2 % (ref 11.5–15.5)
WBC: 6 10*3/uL (ref 4.0–10.5)
nRBC: 0 % (ref 0.0–0.2)

## 2018-09-16 LAB — COMPREHENSIVE METABOLIC PANEL
ALT: 15 U/L (ref 0–44)
AST: 21 U/L (ref 15–41)
Albumin: 3.3 g/dL — ABNORMAL LOW (ref 3.5–5.0)
Alkaline Phosphatase: 44 U/L (ref 38–126)
Anion gap: 11 (ref 5–15)
BUN: 86 mg/dL — ABNORMAL HIGH (ref 8–23)
CO2: 25 mmol/L (ref 22–32)
Calcium: 8.9 mg/dL (ref 8.9–10.3)
Chloride: 100 mmol/L (ref 98–111)
Creatinine, Ser: 6.1 mg/dL — ABNORMAL HIGH (ref 0.61–1.24)
GFR calc Af Amer: 10 mL/min — ABNORMAL LOW (ref >60)
GFR calc non Af Amer: 9 mL/min — ABNORMAL LOW (ref >60)
Glucose, Bld: 117 mg/dL — ABNORMAL HIGH (ref 70–99)
Potassium: 4.6 mmol/L (ref 3.5–5.1)
Sodium: 136 mmol/L (ref 135–145)
Total Bilirubin: 0.5 mg/dL (ref 0.3–1.2)
Total Protein: 6.5 g/dL (ref 6.5–8.1)

## 2018-09-16 LAB — LACTATE DEHYDROGENASE: LDH: 217 U/L — ABNORMAL HIGH (ref 98–192)

## 2018-09-16 NOTE — Patient Instructions (Signed)
[facility] at [facility] Discharge Instructions  Follow up after bone marrow biopsy.    Thank you for choosing [facility] at [facility] to provide your oncology and hematology care.  To afford each patient quality time with our provider, please arrive at least 15 minutes before your scheduled appointment time.   If you have a lab appointment with the Cancer Center please come in thru the  Main Entrance and check in at the main information desk  You need to re-schedule your appointment should you arrive 10 or more minutes late.  We strive to give you quality time with our providers, and arriving late affects you and other patients whose appointments are after yours.  Also, if you no show three or more times for appointments you may be dismissed from the clinic at the providers discretion.     Again, thank you for choosing [facility].  Our hope is that these requests will decrease the amount of time that you wait before being seen by our physicians.       _____________________________________________________________  Should you have questions after your visit to [facility], please contact our office at [phone] between the hours of 8:00 a.m. and 4:30 p.m.  Voicemails left after 4:00 p.m. will not be returned until the following business day.  For prescription refill requests, have your pharmacy contact our office and allow 72 hours.    Cancer Center Support Programs:   > Cancer Support Group  2nd Tuesday of the month 1pm-2pm, Journey Room    

## 2018-09-16 NOTE — Progress Notes (Signed)
[place], [name] [id number]   CLINIC:  Medical Oncology/Hematology  PCP:  [name], NP [place] [address] [phone]   REASON FOR VISIT: Follow-up for MGUS and iron deficiency anemia  CURRENT THERAPY: Intermittent iron infusions   INTERVAL HISTORY:  [name] 68 y.o. male returns for routine follow-up for MGUS and iron deficiency anemia.  He reports he is feeling more fatigue and weakness lately.  He is wanting more testing done to make sure he does not have cancer.  He denies any bright red bleeding per rectum or melena.  He denies any easy bruising.  He denies any bone pain.  He denies any B symptoms. Denies any nausea, vomiting, or diarrhea. Denies any new pains. Had not noticed any recent bleeding such as epistaxis, hematuria or hematochezia. Denies recent chest pain on exertion, shortness of breath on minimal exertion, pre-syncopal episodes, or palpitations. Denies any numbness or tingling in hands or feet. Denies any recent fevers, infections, or recent hospitalizations. Patient reports appetite at 100% and energy level at 25%.  He is eating well and maintaining his weight at this time.    REVIEW OF SYSTEMS:  Review of Systems  Constitutional: Positive for fatigue.  Respiratory: Positive for shortness of breath.   All other systems reviewed and are negative.    PAST MEDICAL/SURGICAL HISTORY:  Past Medical History:  Diagnosis Date  . Anemia   . 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 Asthma   . Chronic combined systolic (congestive) and diastolic (congestive) heart failure ([facility])    a. 12/31/14: 2D ECHO: EF 40-45%, HK of inf myocardium, G1DD, mod MR  . CKD (chronic kidney disease), stage III (HCC)    stage 3 kidney disease  . Coronary artery disease    a. LHC 01/2015 - triple vessel CAD (mod oLM, mLAD, severe mRCA, intermediate branch stenosis, CTO of mCx). Plan CABG 02/2015.  Marland Kitchen GERD (gastroesophageal  reflux disease)   . Hyperlipidemia   . Hypertension   . Mitral regurgitation    a. Mild-mod by echo 12/2014.  Marland Kitchen Myocardial infarction [facility])    pt. states per [name] he has in the past  . NSVT (nonsustained ventricular tachycardia) ([place])    a. 9 beats during 01/2015 adm. BB titrated.  . Obesity    a. BMI 33  . OSA (obstructive sleep apnea) 01/16/2018   Mild obstructive sleep apnea overall with an AHI of 7.3/h and no significant central sleep apnea. Severe obstructive sleep apnea during REM sleep with an AHI of 34.3/h.  Now on CPAP at 6 cm H2O.   . Sleep apnea    2019, Dr.Turner diagnoised   . Type II diabetes mellitus ([place])    Past Surgical History:  Procedure Laterality Date  . BACK SURGERY    . CARDIAC CATHETERIZATION N/A 02/09/2015   Procedure: Left Heart Cath and Coronary Angiography;  Surgeon: Burnell Blanks, MD;  Location: [facility];  Service: Cardiovascular;  Laterality: N/A;  . COLONOSCOPY  2011   Dr. [name]: Ileocecal valve appeared normal, scattered pancolonic diverticulosis, difficult bowel prep making smaller lesions potentially missed. Recommended three-year follow-up colonoscopy.  . COLONOSCOPY  2003   Dr. [name]: Suspicious lesion at the ileocecal valve, multiple biopsies benign, pancolonic diverticulosis.  . COLONOSCOPY N/A 02/15/2016   diverticulosis in sigmoid and descending colon, single 5 mm polyp at splenic flexure (Tubular adenoma)  . CORONARY ARTERY BYPASS GRAFT  N/A 02/18/2015   Procedure: CORONARY ARTERY BYPASS GRAFTING (CABG) x  four, using bilateral internal mammary arteries and right leg greater saphenous vein harvested endoscopically;  Surgeon: [name], MD;  Location: [facility];  Service: Open Heart Surgery;  Laterality: N/A;  . ESOPHAGOGASTRODUODENOSCOPY N/A 02/15/2016   normal  . EYE SURGERY Right    July 2019   . GIVENS CAPSULE STUDY N/A 01/08/2017   couple of gastric and small bowel eroions in setting of aspirin 81 mg daily but nothing  concerning, continue Hematology follow-up  . HERNIA REPAIR  [id number]   Umbilical  . LUMBAR [place] SURGERY  2006   L4 & L5  . REFRACTIVE SURGERY Left    2019   . REFRACTIVE SURGERY Right    2019  . TEE WITHOUT CARDIOVERSION N/A 02/18/2015   Procedure: TRANSESOPHAGEAL ECHOCARDIOGRAM (TEE);  Surgeon: [name], MD;  Location: [facility];  Service: Open Heart Surgery;  Laterality: N/A;  . TONSILLECTOMY  1962     SOCIAL HISTORY:  Social History   Socioeconomic History  . Marital status: Married    Spouse name: Not on file  . Number of children: Not on file  . Years of education: Not on file  . Highest education level: Not on file  Occupational History  . Not on file  Social Needs  . Financial resource strain: Not hard at all  . Food insecurity    Worry: Never true    Inability: Never true  . Transportation needs    Medical: No    Non-medical: No  Tobacco Use  . Smoking status: Former Smoker    Years: 17.00    Types: Cigars    Quit date: 12/31/2014    Years since quitting: 3.7  . Smokeless tobacco: Never Used  Substance and Sexual Activity  . Alcohol use: Yes    Alcohol/week: 0.0 standard drinks    Comment: occasional glass of wine.  . Drug use: No  . Sexual activity: Yes    Birth control/protection: None  Lifestyle  . Physical activity    Days per week: 5 days    Minutes per session: 30 min  . Stress: Only a little  Relationships  . Social connections    Talks on phone: More than three times a week    Gets together: More than three times a week    Attends religious service: More than 4 times per year    Active member of club or organization: Yes    Attends meetings of clubs or organizations: 1 to 4 times per year    Relationship status: Married  . Intimate partner violence    Fear of current or ex partner: No    Emotionally abused: No    Physically abused: No    Forced sexual activity: No  Other Topics Concern  . Not on file  Social History Narrative  . Not on  file    FAMILY HISTORY:  Family History  Problem Relation Age of Onset  . Diabetes Mother   . Heart disease Mother        later in life, age >85  . Heart disease Father        heart failure later in life  . Hypertension Father   . Stroke Sister   . Kidney disease Daughter   . Sudden death Daughter   . Colon cancer Paternal Uncle        Passed from colon CA  . Heart attack Neg Hx  CURRENT MEDICATIONS:  Outpatient Encounter Medications as of 09/16/2018  Medication Sig  . acetaminophen (TYLENOL) 500 MG tablet Take 1,000 mg by mouth 2 (two) times daily as needed for moderate pain.  Marland Kitchen albuterol (PROVENTIL) (2.5 MG/3ML) 0.083% nebulizer solution USE ONE VIAL IN NEBULIZER EVERY 6 HOURS AS NEEDED FOR WHEEZING FOR SHORTNESS OF BREATH  . amLODipine (NORVASC) 10 MG tablet Take 1 tablet (10 mg total) by mouth daily.  Marland Kitchen aspirin EC 81 MG tablet Take 1 tablet (81 mg total) by mouth daily.  Marland Kitchen atorvastatin (LIPITOR) 40 MG tablet TAKE 1 TABLET BY MOUTH ONCE DAILY IN THE EVENING  . carvedilol (COREG) 25 MG tablet Take 25 mg by mouth 2 (two) times daily with a meal.  . Cholecalciferol ([employer] VITAMIN D3) 1000 UNITS capsule Take 2 capsules (2,000 Units total) by mouth daily.  . Cinnamon 500 MG capsule Take 1,000 mg by mouth 2 (two) times daily.   . Coenzyme Q10 (CO Q 10 PO) Take 200 mg by mouth daily.   . fluticasone (FLONASE) 50 MCG/ACT nasal spray Place 2 sprays into both nostrils daily as needed for allergies or rhinitis (SEASONAL ALLERGIES).  . Fluticasone-Salmeterol (ADVAIR DISKUS) 100-50 MCG/DOSE AEPB Inhale 1 puff into the lungs 2 (two) times daily.  . furosemide (LASIX) 40 MG tablet TAKE 1 & 1/2 (ONE & ONE-HALF) TABLETS BY MOUTH TWICE DAILY  . glucose blood (ONE TOUCH ULTRA TEST) test strip Use to test blood sugar three times daily. Dx: E11.21  . hydrALAZINE (APRESOLINE) 100 MG tablet Take 100 mg by mouth 2 (two) times daily.  . insulin detemir (LEVEMIR) 100 UNIT/ML injection Inject 30 Units  into the skin daily as needed.   . montelukast (SINGULAIR) 10 MG tablet Take 1 tablet (10 mg total) by mouth at bedtime.  . Multiple Vitamins-Minerals (ONE-A-DAY MENS 50+ ADVANTAGE) TABS Take 1 tablet by mouth daily.  . nitroGLYCERIN (NITROSTAT) 0.4 MG SL tablet Place 1 tablet (0.4 mg total) under the tongue every 5 (five) minutes as needed for chest pain.  Marland Kitchen PROAIR HFA 108 (90 Base) MCG/ACT inhaler INHALE 2 PUFFS INTO LUNGS EVERY 6 HOURS AS NEEDED FOR SHORTNESS OF BREATH  . isosorbide mononitrate (IMDUR) 30 MG 24 hr tablet Take 1 tablet (30 mg total) by mouth daily.   Facility-Administered Encounter Medications as of 09/16/2018  Medication  . regadenoson (LEXISCAN) injection SOLN 0.4 mg    ALLERGIES:  No Known Allergies   PHYSICAL EXAM:  ECOG Performance status: 1  Vitals:   09/16/18 1300  BP: (!) 146/77  Pulse: 64  Resp: 16  Temp: (!) 97.1 F (36.2 C)  SpO2: 98%   Filed Weights   09/16/18 1300  Weight: 220 lb 2 oz (99.8 kg)    Physical Exam Constitutional:      Appearance: Normal appearance. He is normal weight.  Cardiovascular:     Rate and Rhythm: Normal rate and regular rhythm.     Heart sounds: Normal heart sounds.  Pulmonary:     Effort: Pulmonary effort is normal.     Breath sounds: Normal breath sounds.  Abdominal:     General: Bowel sounds are normal.     Palpations: Abdomen is soft.  Musculoskeletal: Normal range of motion.  Skin:    General: Skin is warm and dry.  Neurological:     Mental Status: He is alert and oriented to person, place, and time. Mental status is at baseline.  Psychiatric:        Mood and Affect: Mood  normal.        Behavior: Behavior normal.        Thought Content: Thought content normal.        Judgment: Judgment normal.      LABORATORY DATA:  I have reviewed the labs as listed.  CBC    Component Value Date/Time   WBC 6.8 09/09/2018 1146   RBC 2.62 (L) 09/09/2018 1146   HGB 7.4 (L) 09/10/2018 1206   HGB 10.0 (L)  04/01/2018 1019   HCT 25.0 (L) 09/09/2018 1146   HCT 28.6 (L) 04/01/2018 1019   PLT 264 09/09/2018 1146   PLT 290 04/01/2018 1019   MCV 95.4 09/09/2018 1146   MCV 96 04/01/2018 1019   MCH 31.3 09/09/2018 1146   MCHC 32.8 09/09/2018 1146   RDW 12.8 09/09/2018 1146   RDW 12.2 04/01/2018 1019   LYMPHSABS 1.0 09/09/2018 1146   LYMPHSABS 1.1 11/15/2016 0815   MONOABS 0.6 09/09/2018 1146   EOSABS 0.1 09/09/2018 1146   EOSABS 0.2 11/15/2016 0815   BASOSABS 0.0 09/09/2018 1146   BASOSABS 0.0 11/15/2016 0815   CMP Latest Ref Rng & Units 09/09/2018 08/30/2018 08/29/2018  Glucose 70 - 99 mg/dL 178(H) 112(H) 82  BUN 8 - 23 mg/dL 88(H) 85(H) 87(H)  Creatinine 0.61 - 1.24 mg/dL 6.11(H) 5.47(H) 5.73(H)  Sodium 135 - 145 mmol/L 139 140 139  Potassium 3.5 - 5.1 mmol/L 3.9 4.0 4.3  Chloride 98 - 111 mmol/L 103 105 105  CO2 22 - 32 mmol/L '26 23 24  '$ Calcium 8.9 - 10.3 mg/dL 9.0 9.3 9.2  Total Protein 6.5 - 8.1 g/dL 6.4(L) - 6.4(L)  Total Bilirubin 0.3 - 1.2 mg/dL 0.6 - 0.6  Alkaline Phos 38 - 126 U/L 46 - 43  AST 15 - 41 U/L 17 - 27  ALT 0 - 44 U/L 15 - 19     I personally performed a face-to-face visit.  All questions were answered to patient's stated satisfaction. Encouraged patient to call with any new concerns or questions before his next visit to the cancer center and we can certain see him sooner, if needed.     ASSESSMENT & PLAN:   IDA (iron deficiency anemia) 1. Normocytic anemia: -This is from CKD and relative iron deficiency. -His last iron infusion was on 09/10/2018.  He will have a second iron infusion on 09/17/2018. -He denies any bright red bleeding per rectum or melena.  -Labs on 09/10/2018 showed his hemoglobin 7.4.  His creatinine has increased to 6.11 -If his hemoglobin doesn't stay above 10 we will consider procrit/Epogen injections. -We will recheck his labs next week if they have not improved we will start Procrit injections and possible blood transfusion. -He will follow  up in 1 months with labs.   2.  IgG lambda MGUS: - Skeletal survey done on 09/20/2017 did not show any lytic lesions. -SPEP from 02/10/2018 showed M spike of 0.3 g/dL.  Free light chain ratio was 1.67.  His creatinine was 3.67. - Labs from 09/10/2018 showed his creatinine has gone up to 6.11.  His hemoglobin is 7.4 after 1 infusion of iron.  Myeloma labs are pending at this time. - He was sent for another skeletal survey today with myeloma labs. -Patient is feeling very weak and fatigued. - Patient was also set up for a bone marrow aspiration and biopsy in [place]. -We will see him back after his biopsy and results.      Orders placed this encounter:  Orders Placed  This Encounter  Procedures  . Biopsy bone marrow  . DG Bone Survey Met  . CT BONE MARROW BIOPSY & ASPIRATION  . CT Biopsy  . Lactate dehydrogenase  . Protein electrophoresis, serum  . Kappa/lambda light chains  . CBC with Differential/Platelet  . Comprehensive metabolic panel  . IgG, IgA, IgM  . CBC with Differential/Platelet  . Comprehensive metabolic panel  . Lactate dehydrogenase  . CBC with Differential/Platelet  . Comprehensive metabolic panel  . CBC with Differential/Platelet  . Comprehensive metabolic panel  . Ferritin  . Iron and TIBC  . Vitamin B12      Francene Finders, FNP-C [place] [phone]

## 2018-09-16 NOTE — Assessment & Plan Note (Signed)
1. Normocytic anemia: -This is from CKD and relative iron deficiency. -His last iron infusion was on 09/10/2018.  He will have a second iron infusion on 09/17/2018. -He denies any bright red bleeding per rectum or melena.  -Labs on 09/10/2018 showed his hemoglobin 7.4.  His creatinine has increased to 6.11 -If his hemoglobin doesn't stay above 10 we will consider procrit/Epogen injections. -We will recheck his labs next week if they have not improved we will start Procrit injections and possible blood transfusion. -He will follow up in 1 months with labs.   2.  IgG lambda MGUS: - Skeletal survey done on 09/20/2017 did not show any lytic lesions. -SPEP from 02/10/2018 showed M spike of 0.3 g/dL.  Free light chain ratio was 1.67.  His creatinine was 3.67. - Labs from 09/10/2018 showed his creatinine has gone up to 6.11.  His hemoglobin is 7.4 after 1 infusion of iron.  Myeloma labs are pending at this time. - He was sent for another skeletal survey today with myeloma labs. -Patient is feeling very weak and fatigued. - Patient was also set up for a bone marrow aspiration and biopsy in [place]. -We will see him back after his biopsy and results.

## 2018-09-17 ENCOUNTER — Encounter (HOSPITAL_COMMUNITY)
Admission: RE | Admit: 2018-09-17 | Discharge: 2018-09-17 | Disposition: A | Discharge status: Home / Self Care | Payer: Medicare Other | Source: Ambulatory Visit | Attending: Nephrology | Admitting: Nephrology

## 2018-09-17 ENCOUNTER — Encounter (HOSPITAL_COMMUNITY): Payer: Self-pay

## 2018-09-17 DIAGNOSIS — D631 Anemia in chronic kidney disease: Secondary | ICD-10-CM | POA: Insufficient documentation

## 2018-09-17 DIAGNOSIS — N184 Chronic kidney disease, stage 4 (severe): Secondary | ICD-10-CM | POA: Diagnosis not present

## 2018-09-17 LAB — IGG, IGA, IGM
IgA: 138 mg/dL (ref 61–437)
IgG (Immunoglobin G), Serum: 733 mg/dL (ref 603–1613)
IgM (Immunoglobulin M), Srm: 25 mg/dL (ref 20–172)

## 2018-09-17 LAB — KAPPA/LAMBDA LIGHT CHAINS
Kappa free light chain: 160.9 mg/L — ABNORMAL HIGH (ref 3.3–19.4)
Kappa, lambda light chain ratio: 1.11 (ref 0.26–1.65)
Lambda free light chains: 145.1 mg/L — ABNORMAL HIGH (ref 5.7–26.3)

## 2018-09-17 MED ORDER — SODIUM CHLORIDE 0.9 % IV SOLN
510.0000 mg | Freq: Once | INTRAVENOUS | Status: AC
  Administered 2018-09-17: 510 mg via INTRAVENOUS
  Filled 2018-09-17: qty 510

## 2018-09-17 MED ORDER — SODIUM CHLORIDE 0.9 % IV SOLN
INTRAVENOUS | Status: DC
  Administered 2018-09-17: 12:00:00 via INTRAVENOUS

## 2018-09-18 LAB — PROTEIN ELECTROPHORESIS, SERUM
A/G Ratio: 1.1 (ref 0.7–1.7)
Albumin ELP: 3.1 g/dL (ref 2.9–4.4)
Alpha-1-Globulin: 0.3 g/dL (ref 0.0–0.4)
Alpha-2-Globulin: 1 g/dL (ref 0.4–1.0)
Beta Globulin: 0.8 g/dL (ref 0.7–1.3)
Gamma Globulin: 0.6 g/dL (ref 0.4–1.8)
Globulin, Total: 2.7 g/dL (ref 2.2–3.9)
M-Spike, %: 0.3 g/dL — ABNORMAL HIGH
Total Protein ELP: 5.8 g/dL — ABNORMAL LOW (ref 6.0–8.5)

## 2018-09-22 DIAGNOSIS — D649 Anemia, unspecified: Secondary | ICD-10-CM | POA: Diagnosis not present

## 2018-09-23 ENCOUNTER — Inpatient Hospital Stay (HOSPITAL_COMMUNITY): Payer: Medicare Other

## 2018-09-23 ENCOUNTER — Other Ambulatory Visit: Payer: Self-pay

## 2018-09-23 DIAGNOSIS — D472 Monoclonal gammopathy: Secondary | ICD-10-CM | POA: Diagnosis not present

## 2018-09-23 DIAGNOSIS — R600 Localized edema: Secondary | ICD-10-CM | POA: Diagnosis not present

## 2018-09-23 DIAGNOSIS — D509 Iron deficiency anemia, unspecified: Secondary | ICD-10-CM | POA: Diagnosis not present

## 2018-09-23 LAB — COMPREHENSIVE METABOLIC PANEL
ALT: 19 U/L (ref 0–44)
AST: 24 U/L (ref 15–41)
Albumin: 3.3 g/dL — ABNORMAL LOW (ref 3.5–5.0)
Alkaline Phosphatase: 42 U/L (ref 38–126)
Anion gap: 10 (ref 5–15)
BUN: 90 mg/dL — ABNORMAL HIGH (ref 8–23)
CO2: 24 mmol/L (ref 22–32)
Calcium: 9 mg/dL (ref 8.9–10.3)
Chloride: 105 mmol/L (ref 98–111)
Creatinine, Ser: 6.16 mg/dL — ABNORMAL HIGH (ref 0.61–1.24)
GFR calc Af Amer: 10 mL/min — ABNORMAL LOW (ref >60)
GFR calc non Af Amer: 8 mL/min — ABNORMAL LOW (ref >60)
Glucose, Bld: 254 mg/dL — ABNORMAL HIGH (ref 70–99)
Potassium: 4.3 mmol/L (ref 3.5–5.1)
Sodium: 139 mmol/L (ref 135–145)
Total Bilirubin: 0.6 mg/dL (ref 0.3–1.2)
Total Protein: 6.4 g/dL — ABNORMAL LOW (ref 6.5–8.1)

## 2018-09-23 LAB — CBC WITH DIFFERENTIAL/PLATELET
Abs Immature Granulocytes: 0.01 10*3/uL (ref 0.00–0.07)
Basophils Absolute: 0 10*3/uL (ref 0.0–0.1)
Basophils Relative: 1 %
Eosinophils Absolute: 0.2 10*3/uL (ref 0.0–0.5)
Eosinophils Relative: 3 %
HCT: 24.6 % — ABNORMAL LOW (ref 39.0–52.0)
Hemoglobin: 7.9 g/dL — ABNORMAL LOW (ref 13.0–17.0)
Immature Granulocytes: 0 %
Lymphocytes Relative: 13 %
Lymphs Abs: 0.8 10*3/uL (ref 0.7–4.0)
MCH: 31.5 pg (ref 26.0–34.0)
MCHC: 32.1 g/dL (ref 30.0–36.0)
MCV: 98 fL (ref 80.0–100.0)
Monocytes Absolute: 0.7 10*3/uL (ref 0.1–1.0)
Monocytes Relative: 11 %
Neutro Abs: 4.5 10*3/uL (ref 1.7–7.7)
Neutrophils Relative %: 72 %
Platelets: 235 10*3/uL (ref 150–400)
RBC: 2.51 MIL/uL — ABNORMAL LOW (ref 4.22–5.81)
RDW: 13.9 % (ref 11.5–15.5)
WBC: 6.3 10*3/uL (ref 4.0–10.5)
nRBC: 0 % (ref 0.0–0.2)

## 2018-09-24 ENCOUNTER — Encounter (HOSPITAL_COMMUNITY)
Admission: RE | Admit: 2018-09-24 | Discharge: 2018-09-24 | Disposition: A | Discharge status: Home / Self Care | Payer: Medicare Other | Source: Ambulatory Visit | Attending: Nephrology | Admitting: Nephrology

## 2018-09-24 DIAGNOSIS — D631 Anemia in chronic kidney disease: Secondary | ICD-10-CM | POA: Diagnosis not present

## 2018-09-24 DIAGNOSIS — N184 Chronic kidney disease, stage 4 (severe): Secondary | ICD-10-CM | POA: Diagnosis not present

## 2018-09-24 MED ORDER — EPOETIN ALFA-EPBX 10000 UNIT/ML IJ SOLN
INTRAMUSCULAR | Status: AC
  Filled 2018-09-24: qty 2

## 2018-09-24 MED ORDER — EPOETIN ALFA-EPBX 10000 UNIT/ML IJ SOLN
20000.0000 [IU] | Freq: Once | INTRAMUSCULAR | Status: AC
  Administered 2018-09-24: 20000 [IU] via SUBCUTANEOUS

## 2018-09-24 NOTE — Progress Notes (Signed)
Pt states had blood drawn yesterday here at [facility]. Lab results in Epic. Hgb 7.9. retacrit given as ordered.

## 2018-09-25 ENCOUNTER — Ambulatory Visit (HOSPITAL_COMMUNITY): Admission: RE | Admit: 2018-09-25 | Payer: Medicare Other | Source: Ambulatory Visit

## 2018-09-25 ENCOUNTER — Ambulatory Visit (HOSPITAL_COMMUNITY): Payer: Medicare Other

## 2018-09-26 ENCOUNTER — Other Ambulatory Visit: Payer: Self-pay | Admitting: Radiology

## 2018-09-29 ENCOUNTER — Other Ambulatory Visit: Payer: Self-pay

## 2018-09-29 ENCOUNTER — Ambulatory Visit (HOSPITAL_COMMUNITY)
Admission: RE | Admit: 2018-09-29 | Discharge: 2018-09-29 | Disposition: A | Discharge status: Home / Self Care | Payer: Medicare Other | Source: Ambulatory Visit | Attending: Nurse Practitioner | Admitting: Nurse Practitioner

## 2018-09-29 ENCOUNTER — Encounter (HOSPITAL_COMMUNITY): Payer: Self-pay

## 2018-09-29 DIAGNOSIS — Z7982 Long term (current) use of aspirin: Secondary | ICD-10-CM | POA: Insufficient documentation

## 2018-09-29 DIAGNOSIS — Z794 Long term (current) use of insulin: Secondary | ICD-10-CM | POA: Insufficient documentation

## 2018-09-29 DIAGNOSIS — I130 Hypertensive heart and chronic kidney disease with heart failure and stage 1 through stage 4 chronic kidney disease, or unspecified chronic kidney disease: Secondary | ICD-10-CM | POA: Insufficient documentation

## 2018-09-29 DIAGNOSIS — E785 Hyperlipidemia, unspecified: Secondary | ICD-10-CM | POA: Diagnosis not present

## 2018-09-29 DIAGNOSIS — N183 Chronic kidney disease, stage 3 (moderate): Secondary | ICD-10-CM | POA: Insufficient documentation

## 2018-09-29 DIAGNOSIS — Z8249 Family history of ischemic heart disease and other diseases of the circulatory system: Secondary | ICD-10-CM | POA: Diagnosis not present

## 2018-09-29 DIAGNOSIS — M19042 Primary osteoarthritis, left hand: Secondary | ICD-10-CM | POA: Insufficient documentation

## 2018-09-29 DIAGNOSIS — I340 Nonrheumatic mitral (valve) insufficiency: Secondary | ICD-10-CM | POA: Diagnosis not present

## 2018-09-29 DIAGNOSIS — E669 Obesity, unspecified: Secondary | ICD-10-CM | POA: Diagnosis not present

## 2018-09-29 DIAGNOSIS — Z800 Family history of malignant neoplasm of digestive organs: Secondary | ICD-10-CM | POA: Diagnosis not present

## 2018-09-29 DIAGNOSIS — R5383 Other fatigue: Secondary | ICD-10-CM | POA: Insufficient documentation

## 2018-09-29 DIAGNOSIS — Z79899 Other long term (current) drug therapy: Secondary | ICD-10-CM | POA: Insufficient documentation

## 2018-09-29 DIAGNOSIS — E1122 Type 2 diabetes mellitus with diabetic chronic kidney disease: Secondary | ICD-10-CM | POA: Insufficient documentation

## 2018-09-29 DIAGNOSIS — I25810 Atherosclerosis of coronary artery bypass graft(s) without angina pectoris: Secondary | ICD-10-CM | POA: Insufficient documentation

## 2018-09-29 DIAGNOSIS — Z951 Presence of aortocoronary bypass graft: Secondary | ICD-10-CM | POA: Diagnosis not present

## 2018-09-29 DIAGNOSIS — I252 Old myocardial infarction: Secondary | ICD-10-CM | POA: Insufficient documentation

## 2018-09-29 DIAGNOSIS — Z6833 Body mass index (BMI) 33.0-33.9, adult: Secondary | ICD-10-CM | POA: Insufficient documentation

## 2018-09-29 DIAGNOSIS — G4733 Obstructive sleep apnea (adult) (pediatric): Secondary | ICD-10-CM | POA: Diagnosis not present

## 2018-09-29 DIAGNOSIS — I472 Ventricular tachycardia: Secondary | ICD-10-CM | POA: Insufficient documentation

## 2018-09-29 DIAGNOSIS — D4989 Neoplasm of unspecified behavior of other specified sites: Secondary | ICD-10-CM | POA: Diagnosis not present

## 2018-09-29 DIAGNOSIS — D472 Monoclonal gammopathy: Secondary | ICD-10-CM

## 2018-09-29 DIAGNOSIS — I5042 Chronic combined systolic (congestive) and diastolic (congestive) heart failure: Secondary | ICD-10-CM | POA: Diagnosis not present

## 2018-09-29 DIAGNOSIS — J45909 Unspecified asthma, uncomplicated: Secondary | ICD-10-CM | POA: Insufficient documentation

## 2018-09-29 DIAGNOSIS — Z87891 Personal history of nicotine dependence: Secondary | ICD-10-CM | POA: Diagnosis not present

## 2018-09-29 DIAGNOSIS — Z833 Family history of diabetes mellitus: Secondary | ICD-10-CM | POA: Diagnosis not present

## 2018-09-29 LAB — CBC WITH DIFFERENTIAL/PLATELET
Abs Immature Granulocytes: 0.01 10*3/uL (ref 0.00–0.07)
Basophils Absolute: 0 10*3/uL (ref 0.0–0.1)
Basophils Relative: 0 %
Eosinophils Absolute: 0.2 10*3/uL (ref 0.0–0.5)
Eosinophils Relative: 3 %
HCT: 25.6 % — ABNORMAL LOW (ref 39.0–52.0)
Hemoglobin: 8.2 g/dL — ABNORMAL LOW (ref 13.0–17.0)
Immature Granulocytes: 0 %
Lymphocytes Relative: 10 %
Lymphs Abs: 0.5 10*3/uL — ABNORMAL LOW (ref 0.7–4.0)
MCH: 31.8 pg (ref 26.0–34.0)
MCHC: 32 g/dL (ref 30.0–36.0)
MCV: 99.2 fL (ref 80.0–100.0)
Monocytes Absolute: 0.5 10*3/uL (ref 0.1–1.0)
Monocytes Relative: 9 %
Neutro Abs: 4.2 10*3/uL (ref 1.7–7.7)
Neutrophils Relative %: 78 %
Platelets: 261 10*3/uL (ref 150–400)
RBC: 2.58 MIL/uL — ABNORMAL LOW (ref 4.22–5.81)
RDW: 13.4 % (ref 11.5–15.5)
WBC: 5.3 10*3/uL (ref 4.0–10.5)
nRBC: 0 % (ref 0.0–0.2)

## 2018-09-29 LAB — BASIC METABOLIC PANEL
Anion gap: 17 — ABNORMAL HIGH (ref 5–15)
BUN: 73 mg/dL — ABNORMAL HIGH (ref 8–23)
CO2: 21 mmol/L — ABNORMAL LOW (ref 22–32)
Calcium: 9.2 mg/dL (ref 8.9–10.3)
Chloride: 102 mmol/L (ref 98–111)
Creatinine, Ser: 5.8 mg/dL — ABNORMAL HIGH (ref 0.61–1.24)
GFR calc Af Amer: 11 mL/min — ABNORMAL LOW (ref >60)
GFR calc non Af Amer: 9 mL/min — ABNORMAL LOW (ref >60)
Glucose, Bld: 126 mg/dL — ABNORMAL HIGH (ref 70–99)
Potassium: 4 mmol/L (ref 3.5–5.1)
Sodium: 140 mmol/L (ref 135–145)

## 2018-09-29 LAB — GLUCOSE, CAPILLARY: Glucose-Capillary: 113 mg/dL — ABNORMAL HIGH (ref 70–99)

## 2018-09-29 LAB — PROTIME-INR
INR: 1 (ref 0.8–1.2)
Prothrombin Time: 13.5 seconds (ref 11.4–15.2)

## 2018-09-29 MED ORDER — SODIUM CHLORIDE 0.9 % IV SOLN
INTRAVENOUS | Status: DC
  Administered 2018-09-29: 10:00:00 via INTRAVENOUS

## 2018-09-29 MED ORDER — LIDOCAINE HCL (PF) 1 % IJ SOLN
INTRAMUSCULAR | Status: AC | PRN
  Administered 2018-09-29: 10 mL

## 2018-09-29 MED ORDER — MIDAZOLAM HCL 2 MG/2ML IJ SOLN
INTRAMUSCULAR | Status: AC | PRN
  Administered 2018-09-29: 1 mg via INTRAVENOUS

## 2018-09-29 MED ORDER — FENTANYL CITRATE (PF) 100 MCG/2ML IJ SOLN
INTRAMUSCULAR | Status: AC
  Filled 2018-09-29: qty 2

## 2018-09-29 MED ORDER — MIDAZOLAM HCL 2 MG/2ML IJ SOLN
INTRAMUSCULAR | Status: AC
  Filled 2018-09-29: qty 4

## 2018-09-29 MED ORDER — FENTANYL CITRATE (PF) 100 MCG/2ML IJ SOLN
INTRAMUSCULAR | Status: AC | PRN
  Administered 2018-09-29: 50 ug via INTRAVENOUS

## 2018-09-29 MED ORDER — ONDANSETRON HCL 4 MG/2ML IJ SOLN: 4.0000 mg | Freq: Once | INTRAMUSCULAR | Status: DC

## 2018-09-29 MED ORDER — ONDANSETRON HCL 4 MG/2ML IJ SOLN
INTRAMUSCULAR | Status: AC
  Administered 2018-09-29: 4 mg
  Filled 2018-09-29: qty 2

## 2018-09-29 NOTE — H&P (Signed)
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 male with past medical history of CAD, CKD, GERD, HTN, DM recently presented to [facility] with fatigue and shortness of breath.  He was found to have a hemoglobin of 8.0.  He received 1u PRBC with improvement and was discharged home.  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  He presents to [facility] today for bone marrow biopsy.   Patient assessed at bedside this AM.  Reports nausea after taking medication on an empty stomach.  He has been NPO.  He does not take blood thinners. He remains fatigued after his recent admission.  Otherwise denies new complaints or concerns.   Past Medical History:  Diagnosis Date  . Anemia   . 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 Asthma   . Chronic combined systolic (congestive) and diastolic (congestive) heart failure ([place])    a. 12/31/14: 2D ECHO: EF 40-45%, HK of inf myocardium, G1DD, mod MR  . CKD (chronic kidney disease), stage III (HCC)    stage 3 kidney disease  . Coronary artery disease    a. LHC 01/2015 - triple vessel CAD (mod oLM, mLAD, severe mRCA, intermediate branch stenosis, CTO of mCx). Plan CABG 02/2015.  Marland Kitchen GERD (gastroesophageal reflux disease)   . Hyperlipidemia   . Hypertension   . Mitral regurgitation    a. Mild-mod by echo 12/2014.  Marland Kitchen Myocardial infarction [facility])    pt. states per [name] he has in the past  . NSVT (nonsustained ventricular tachycardia) ([place])    a. 9 beats during 01/2015 adm. BB titrated.  . Obesity    a. BMI 33  . OSA (obstructive sleep apnea) 01/16/2018   Mild obstructive sleep apnea overall with an AHI of 7.3/h and no significant central sleep apnea. Severe obstructive sleep apnea during REM sleep with an AHI of 34.3/h.  Now on CPAP at 6 cm  H2O.   . Sleep apnea    2019, Dr.Turner diagnoised   . Type II diabetes mellitus ([place])     Past Surgical History:  Procedure Laterality Date  . BACK SURGERY    . CARDIAC CATHETERIZATION N/A 02/09/2015   Procedure: Left Heart Cath and Coronary Angiography;  Surgeon: Burnell Blanks, MD;  Location: [place] [facility];  Service: Cardiovascular;  Laterality: N/A;  . COLONOSCOPY  2011   Dr. [name]: Ileocecal valve appeared normal, scattered pancolonic diverticulosis, difficult bowel prep making smaller lesions potentially missed. Recommended three-year follow-up colonoscopy.  . COLONOSCOPY  2003   Dr. [name]: Suspicious lesion at the ileocecal valve, multiple biopsies benign, pancolonic diverticulosis.  . COLONOSCOPY N/A 02/15/2016   diverticulosis in sigmoid and descending colon, single 5 mm polyp at splenic flexure (Tubular adenoma)  . CORONARY ARTERY BYPASS GRAFT N/A 02/18/2015   Procedure: CORONARY ARTERY BYPASS GRAFTING (CABG) x  four, using bilateral internal mammary arteries and right leg greater saphenous vein harvested endoscopically;  Surgeon: [name], MD;  Location: [place] OR;  Service: Open Heart Surgery;  Laterality: N/A;  . ESOPHAGOGASTRODUODENOSCOPY N/A 02/15/2016   normal  . EYE SURGERY Right    July 2019   . GIVENS CAPSULE STUDY N/A 01/08/2017   couple of gastric and small bowel eroions in setting of aspirin 81 mg daily but nothing  concerning, continue Hematology follow-up  . HERNIA REPAIR  2637   Umbilical  . LUMBAR [place] SURGERY  2006   L4 & L5  . REFRACTIVE SURGERY Left    2019   . REFRACTIVE SURGERY Right    2019  . TEE WITHOUT CARDIOVERSION N/A 02/18/2015   Procedure: TRANSESOPHAGEAL ECHOCARDIOGRAM (TEE);  Surgeon: [name], MD;  Location: [place];  Service: Open Heart Surgery;  Laterality: N/A;  . TONSILLECTOMY  1962    Allergies: Patient has no known allergies.  Medications: Prior to Admission medications   Medication Sig Start Date End Date Taking?  Authorizing Provider  amLODipine (NORVASC) 10 MG tablet Take 1 tablet (10 mg total) by mouth daily. 06/23/18  Yes [name], NP  aspirin EC 81 MG tablet Take 1 tablet (81 mg total) by mouth daily. 01/24/16  Yes Burtis Junes, NP  atorvastatin (LIPITOR) 40 MG tablet TAKE 1 TABLET BY MOUTH ONCE DAILY IN THE EVENING 09/10/18  Yes Burtis Junes, NP  carvedilol (COREG) 25 MG tablet Take 25 mg by mouth 2 (two) times daily with a meal.   Yes [provider]  Cholecalciferol ([employer]S VITAMIN D3) 1000 UNITS capsule Take 2 capsules (2,000 Units total) by mouth daily. 10/07/13  Yes Blanchie Serve, MD  Cinnamon 500 MG capsule Take 1,000 mg by mouth 2 (two) times daily.    Yes [provider]  Coenzyme Q10 (CO Q 10 PO) Take 200 mg by mouth daily.    Yes [provider]  fluticasone (FLONASE) 50 MCG/ACT nasal spray Place 2 sprays into both nostrils daily as needed for allergies or rhinitis (SEASONAL ALLERGIES).   Yes [provider]  Fluticasone-Salmeterol (ADVAIR DISKUS) 100-50 MCG/DOSE AEPB Inhale 1 puff into the lungs 2 (two) times daily. 09/30/17  Yes Eulas Post, Monica, DO  furosemide (LASIX) 40 MG tablet TAKE 1 & 1/2 (ONE & ONE-HALF) TABLETS BY MOUTH TWICE DAILY 06/23/18  Yes Jerline Pain, MD  hydrALAZINE (APRESOLINE) 100 MG tablet Take 100 mg by mouth 2 (two) times daily. 06/21/18  Yes [provider]  montelukast (SINGULAIR) 10 MG tablet Take 1 tablet (10 mg total) by mouth at bedtime. 06/23/18  Yes [name], NP  Multiple Vitamins-Minerals (ONE-A-DAY MENS 50+ ADVANTAGE) TABS Take 1 tablet by mouth daily.   Yes [provider]  acetaminophen (TYLENOL) 500 MG tablet Take 1,000 mg by mouth 2 (two) times daily as needed for moderate pain.    [provider]  albuterol (PROVENTIL) (2.5 MG/3ML) 0.083% nebulizer solution USE ONE VIAL IN NEBULIZER EVERY 6 HOURS AS NEEDED FOR WHEEZING FOR SHORTNESS OF BREATH 08/04/18   [name], NP   glucose blood (ONE TOUCH ULTRA TEST) test strip Use to test blood sugar three times daily. Dx: E11.21 12/04/17   Gildardo Cranker, DO  insulin detemir (LEVEMIR) 100 UNIT/ML injection Inject 30 Units into the skin daily as needed.     [provider]  isosorbide mononitrate (IMDUR) 30 MG 24 hr tablet Take 1 tablet (30 mg total) by mouth daily. 04/02/18 08/29/18  Burtis Junes, NP  nitroGLYCERIN (NITROSTAT) 0.4 MG SL tablet Place 1 tablet (0.4 mg total) under the tongue every 5 (five) minutes as needed for chest pain. 05/22/16 11/26/48  Burtis Junes, NP  PROAIR HFA 108 [phone] Base) MCG/ACT inhaler INHALE 2 PUFFS INTO LUNGS EVERY 6 HOURS AS NEEDED FOR SHORTNESS OF BREATH 08/01/18   [name], NP     Family History  Problem Relation Age of Onset  . Diabetes Mother   . Heart disease Mother        later in life, age >2  . Heart disease Father        heart failure later in life  . Hypertension Father   . Stroke Sister   . Kidney disease Daughter   . Sudden death Daughter   . Colon cancer Paternal Uncle        Passed from colon CA  . Heart attack Neg Hx     Social History   Socioeconomic History  . Marital status: Married    Spouse name: Not on file  . Number of children: Not on file  . Years of education: Not on file  . Highest education level: Not on file  Occupational History  . Not on file  Social Needs  . Financial resource strain: Not hard at all  . Food insecurity    Worry: Never true    Inability: Never true  . Transportation needs    Medical: No    Non-medical: No  Tobacco Use  . Smoking status: Former Smoker    Years: 17.00    Types: Cigars    Quit date: 12/31/2014    Years since quitting: 3.7  . Smokeless tobacco: Never Used  Substance and Sexual Activity  . Alcohol use: Yes    Alcohol/week: 0.0 standard drinks    Comment: occasional glass of wine.  . Drug use: No  . Sexual activity: Yes    Birth control/protection: None  Lifestyle  .  Physical activity    Days per week: 5 days    Minutes per session: 30 min  . Stress: Only a little  Relationships  . Social connections    Talks on phone: More than three times a week    Gets together: More than three times a week    Attends religious service: More than 4 times per year    Active member of club or organization: Yes    Attends meetings of clubs or organizations: 1 to 4 times per year    Relationship status: Married  Other Topics Concern  . Not on file  Social History Narrative  . Not on file     Review of Systems: A 12 point ROS discussed and pertinent positives are indicated in the HPI above.  All other systems are negative.  Review of Systems  Constitutional: Negative for fatigue and fever.  Respiratory: Negative for cough and shortness of breath.   Cardiovascular: Negative for chest pain.  Gastrointestinal: Negative for abdominal pain and blood in stool.  Genitourinary: Negative for hematuria.  Musculoskeletal: Negative for back pain.  Psychiatric/Behavioral: Negative for behavioral problems and confusion.    Vital Signs: BP 98/60   Pulse (!) 50   Temp 98 F (36.7 C) (Oral)   Resp 18   SpO2 100%   Physical Exam Vitals signs and nursing note reviewed.  Constitutional:      Appearance: Normal appearance.  HENT:     Mouth/Throat:     Mouth: Mucous membranes are moist.     Pharynx: Oropharynx is clear.  Cardiovascular:     Rate and Rhythm: Regular rhythm. Bradycardia present.     Heart sounds: No murmur. No friction rub. No gallop.   Pulmonary:     Effort: Pulmonary effort is normal. No respiratory distress.     Breath sounds: Normal breath sounds.  Skin:    General: Skin is warm and dry.  Neurological:     General: No focal deficit present.     Mental Status: He is alert and oriented to person, place, and time. Mental status is at baseline.  Psychiatric:        Mood and Affect: Mood normal.        Behavior: Behavior normal.        Thought  Content: Thought content normal.        Judgment: Judgment normal.      MD Evaluation Airway: WNL Heart: WNL Abdomen: WNL Chest/ Lungs: WNL ASA  Classification: 3 Mallampati/Airway Score: Two   Imaging: Dg Bone Survey Met  Result Date: 09/16/2018 CLINICAL DATA:  Monoclonal gammopathy EXAM: METASTATIC BONE SURVEY COMPARISON:  None. FINDINGS: Degenerative disc disease in the cervical, thoracic and lumbar spine. Degenerative facet disease in the cervical and lumbar spine. No suspicious focal lytic lesion.  No acute bony abnormality. Degenerative changes in the [facility] joints bilaterally. Joint space narrowing and spurring in the medial compartment of the right knee. IMPRESSION: No acute bony abnormality or suspicious focal bone lesion. Electronically Signed   By: Rolm Baptise M.D.   On: 09/16/2018 20:32    Labs:  CBC: Recent Labs    09/09/18 1146 09/10/18 1206 09/16/18 1523 09/23/18 1305 09/29/18 0940  WBC 6.8  --  6.0 6.3 5.3  HGB 8.2* 7.4* 8.1* 7.9* 8.2*  HCT 25.0*  --  25.2* 24.6* 25.6*  PLT 264  --  270 235 261    COAGS: Recent Labs    09/29/18 0940  INR 1.0    BMP: Recent Labs    09/09/18 1146 09/16/18 1523 09/23/18 1305 09/29/18 0940  NA 139 136 139 140  K 3.9 4.6 4.3 4.0  CL 103 100 105 102  CO2 '26 25 24 '$ 21*  GLUCOSE 178* 117* 254* 126*  BUN 88* 86* 90* 73*  CALCIUM 9.0 8.9 9.0 9.2  CREATININE 6.11* 6.10* 6.16* 5.80*  GFRNONAA 9* 9* 8* 9*  GFRAA 10* 10* 10* 11*    LIVER FUNCTION TESTS: Recent Labs    08/29/18 1517 09/09/18 1146 09/16/18 1523 09/23/18 1305  BILITOT 0.6 0.6 0.5 0.6  AST '27 17 21 24  '$ ALT '19 15 15 19  '$ ALKPHOS 43 46 44 42  PROT 6.4* 6.4* 6.5 6.4*  ALBUMIN 3.5 3.3* 3.3* 3.3*    TUMOR MARKERS: No results for input(s): AFPTM, CEA, CA199, CHROMGRNA in the last 8760 hours.  Assessment and Plan: Patient with past medical history of DM, CAD, MI, HTN, CKD presents with complaint of fatigue, MGUS.  IR consulted for bone marrow biopsy  at the request of Francene Finders, NP. Case reviewed by Dr. Pascal Lux who approves patient for procedure.  Patient presents today in their usual state of health.  He has been NPO and is not currently on blood thinners.  HgB stable this AM at 8.2  Risks and benefits of biopsy was discussed with the patient and/or patient's family including, but not limited to bleeding, infection, damage to adjacent structures or low yield requiring additional tests.  All of the questions were answered and there is agreement to proceed.  Consent signed and in chart.  Thank you for this interesting consult.  I greatly enjoyed meeting ASIR BINGLEY and look forward to participating in their care.  A copy of this report was sent to the requesting provider on this date.  Electronically Signed: Docia Barrier, PA 09/29/2018, 10:17 AM   I spent a total of  30 Minutes  in face to face in clinical consultation, greater than 50% of which was counseling/coordinating care for MGUS.

## 2018-09-29 NOTE — Procedures (Signed)
Pre-procedure Diagnosis: MGUS Post-procedure Diagnosis: Same  Technically successful CT guided bone marrow aspiration and biopsy of left iliac crest.   Complications: None Immediate  EBL: None  Signed: Sandi Mariscal Pager: [phone] 09/29/2018, 11:26 AM

## 2018-09-29 NOTE — Discharge Instructions (Signed)
Bone Marrow Aspiration and Bone Marrow Biopsy, Adult, Care After °This sheet gives you information about how to care for yourself after your procedure. Your health care provider may also give you more specific instructions. If you have problems or questions, contact your health care provider. °What can I expect after the procedure? °After the procedure, it is common to have: °· Mild pain and tenderness. °· Swelling. °· Bruising. °Follow these instructions at home: °Puncture site care ° °  ° °· Follow instructions from your health care provider about how to take care of the puncture site. Make sure you: °? Wash your hands with soap and water before you change your bandage (dressing). If soap and water are not available, use hand sanitizer. °? Change your dressing as told by your health care provider. °· Check your puncture site every day for signs of infection. Check for: °? More redness, swelling, or pain. °? More fluid or blood. °? Warmth. °? Pus or a bad smell. °General instructions °· Take over-the-counter and prescription medicines only as told by your health care provider. °· Do not take baths, swim, or use a hot tub until your health care provider approves. Ask if you can take a shower or have a sponge bath. °· Return to your normal activities as told by your health care provider. Ask your health care provider what activities are safe for you. °· Do not drive for 24 hours if you were given a medicine to help you relax (sedative) during your procedure. °· Keep all follow-up visits as told by your health care provider. This is important. °Contact a health care provider if: °· Your pain is not controlled with medicine. °Get help right away if: °· You have a fever. °· You have more redness, swelling, or pain around the puncture site. °· You have more fluid or blood coming from the puncture site. °· Your puncture site feels warm to the touch. °· You have pus or a bad smell coming from the puncture site. °These  symptoms may represent a serious problem that is an emergency. Do not wait to see if the symptoms will go away. Get medical help right away. Call your local emergency services (911 in the [place] Do not drive yourself to the hospital. °Summary °· After the procedure, it is common to have mild pain, tenderness, swelling, and bruising. °· Follow instructions from your health care provider about how to take care of the puncture site. °· Get help right away if you have any symptoms of infection or if you have more blood or fluid coming from the puncture site. °This information is not intended to replace advice given to you by your health care provider. Make sure you discuss any questions you have with your health care provider. °Document Released: 08/18/2004 Document Revised: 05/14/2017 Document Reviewed: 07/13/2015 °Elsevier Patient Education © 2020 [employer] ° ° ° ° °Moderate Conscious Sedation, Adult, Care After °These instructions provide you with information about caring for yourself after your procedure. Your health care provider may also give you more specific instructions. Your treatment has been planned according to current medical practices, but problems sometimes occur. Call your health care provider if you have any problems or questions after your procedure. °What can I expect after the procedure? °After your procedure, it is common: °· To feel sleepy for several hours. °· To feel clumsy and have poor balance for several hours. °· To have poor judgment for several hours. °· To vomit if you eat too soon. °  Follow these instructions at home: °For at least 24 hours after the procedure: ° °· Do not: °? Participate in activities where you could fall or become injured. °? Drive. °? Use heavy machinery. °? Drink alcohol. °? Take sleeping pills or medicines that cause drowsiness. °? Make important decisions or sign legal documents. °? Take care of children on your own. °· Rest. °Eating and drinking °· Follow the  diet recommended by your health care provider. °· If you vomit: °? Drink water, juice, or soup when you can drink without vomiting. °? Make sure you have little or no nausea before eating solid foods. °General instructions °· Have a responsible adult stay with you until you are awake and alert. °· Take over-the-counter and prescription medicines only as told by your health care provider. °· If you smoke, do not smoke without supervision. °· Keep all follow-up visits as told by your health care provider. This is important. °Contact a health care provider if: °· You keep feeling nauseous or you keep vomiting. °· You feel light-headed. °· You develop a rash. °· You have a fever. °Get help right away if: °· You have trouble breathing. °This information is not intended to replace advice given to you by your health care provider. Make sure you discuss any questions you have with your health care provider. °Document Released: 11/19/2012 Document Revised: 01/11/2017 Document Reviewed: 05/21/2015 °Elsevier Patient Education © 2020 [employer] ° °

## 2018-10-02 ENCOUNTER — Other Ambulatory Visit: Payer: Self-pay | Admitting: Cardiology

## 2018-10-06 ENCOUNTER — Inpatient Hospital Stay (HOSPITAL_BASED_OUTPATIENT_CLINIC_OR_DEPARTMENT_OTHER): Payer: Medicare Other | Admitting: Hematology

## 2018-10-06 ENCOUNTER — Other Ambulatory Visit: Payer: Self-pay

## 2018-10-06 ENCOUNTER — Encounter (HOSPITAL_COMMUNITY): Payer: Self-pay | Admitting: Hematology

## 2018-10-06 VITALS — BP 154/76 | HR 74 | Temp 97.5°F | Resp 18 | Wt 213.4 lb

## 2018-10-06 DIAGNOSIS — D472 Monoclonal gammopathy: Secondary | ICD-10-CM | POA: Diagnosis not present

## 2018-10-06 DIAGNOSIS — C9000 Multiple myeloma not having achieved remission: Secondary | ICD-10-CM

## 2018-10-06 DIAGNOSIS — D509 Iron deficiency anemia, unspecified: Secondary | ICD-10-CM | POA: Diagnosis not present

## 2018-10-06 DIAGNOSIS — R600 Localized edema: Secondary | ICD-10-CM | POA: Diagnosis not present

## 2018-10-06 NOTE — Progress Notes (Signed)
Henry Carroll, [place] [address]   CLINIC:  Medical Oncology/Hematology  PCP:  [name], NP [place] [address] [phone]   REASON FOR VISIT: Follow-up for iron deficiency anemia and MGUS  CURRENT THERAPY:  Intermittent iron therapy and Retacrit.   INTERVAL HISTORY:  Henry Carroll 68 y.o. male seen for follow-up of plasma cell disorder and anemia.  He did have a bone marrow biopsy done on 09/29/2018 for worsening kidney function.  Denies any new onset bone pains.  He is reporting taking Lasix 60 mg twice daily.  Appetite is 100%.  Energy levels are low.  He complains of shortness of breath on exertion.  Ankle swelling has been stable.  Denies any fevers or infections.  No recent weight changes significantly.   REVIEW OF SYSTEMS:  Review of Systems  Constitutional: Positive for fatigue.  Respiratory: Positive for shortness of breath.   Cardiovascular: Positive for leg swelling.  Psychiatric/Behavioral: Positive for sleep disturbance.  All other systems reviewed and are negative.    PAST MEDICAL/SURGICAL HISTORY:  Past Medical History:  Diagnosis Date  . Anemia   . 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 Asthma   . Chronic combined systolic (congestive) and diastolic (congestive) heart failure ([place])    a. 12/31/14: 2D ECHO: EF 40-45%, HK of inf myocardium, G1DD, mod MR  . CKD (chronic kidney disease), stage III (HCC)    stage 3 kidney disease  . Coronary artery disease    a. LHC 01/2015 - triple vessel CAD (mod oLM, mLAD, severe mRCA, intermediate branch stenosis, CTO of mCx). Plan CABG 02/2015.  Marland Kitchen GERD (gastroesophageal reflux disease)   . Hyperlipidemia   . Hypertension   . Mitral regurgitation    a. Mild-mod by echo 12/2014.  Marland Kitchen Myocardial infarction [facility])    pt. states per [name] he has in the past  . NSVT (nonsustained ventricular tachycardia) ([place])    a. 9 beats during 01/2015 adm. BB titrated.  .  Obesity    a. BMI 33  . OSA (obstructive sleep apnea) 01/16/2018   Mild obstructive sleep apnea overall with an AHI of 7.3/h and no significant central sleep apnea. Severe obstructive sleep apnea during REM sleep with an AHI of 34.3/h.  Now on CPAP at 6 cm H2O.   . Sleep apnea    2019, Dr.Turner diagnoised   . Type II diabetes mellitus ([place])    Past Surgical History:  Procedure Laterality Date  . BACK SURGERY    . CARDIAC CATHETERIZATION N/A 02/09/2015   Procedure: Left Heart Cath and Coronary Angiography;  Surgeon: Burnell Blanks, MD;  Location: [facility];  Service: Cardiovascular;  Laterality: N/A;  . COLONOSCOPY  2011   Dr. [name]: Ileocecal valve appeared normal, scattered pancolonic diverticulosis, difficult bowel prep making smaller lesions potentially missed. Recommended three-year follow-up colonoscopy.  . COLONOSCOPY  2003   Dr. [name]: Suspicious lesion at the ileocecal valve, multiple biopsies benign, pancolonic diverticulosis.  . COLONOSCOPY N/A 02/15/2016   diverticulosis in sigmoid and descending colon, single 5 mm polyp at splenic flexure (Tubular adenoma)  . CORONARY ARTERY BYPASS GRAFT N/A 02/18/2015   Procedure: CORONARY ARTERY BYPASS GRAFTING (CABG) x  four, using bilateral internal mammary arteries and right leg greater saphenous vein harvested endoscopically;  Surgeon: [name], MD;  Location: [facility];  Service: Open Heart Surgery;  Laterality: N/A;  . ESOPHAGOGASTRODUODENOSCOPY N/A 02/15/2016  normal  . EYE SURGERY Right    July 2019   . GIVENS CAPSULE STUDY N/A 01/08/2017   couple of gastric and small bowel eroions in setting of aspirin 81 mg daily but nothing concerning, continue Hematology follow-up  . HERNIA REPAIR  3299   Umbilical  . LUMBAR [place] SURGERY  2006   L4 & L5  . REFRACTIVE SURGERY Left    2019   . REFRACTIVE SURGERY Right    2019  . TEE WITHOUT CARDIOVERSION N/A 02/18/2015   Procedure: TRANSESOPHAGEAL ECHOCARDIOGRAM (TEE);  Surgeon:  [name], MD;  Location: [facility];  Service: Open Heart Surgery;  Laterality: N/A;  . TONSILLECTOMY  1962     SOCIAL HISTORY:  Social History   Socioeconomic History  . Marital status: Married    Spouse name: Not on file  . Number of children: Not on file  . Years of education: Not on file  . Highest education level: Not on file  Occupational History  . Not on file  Social Needs  . Financial resource strain: Not hard at all  . Food insecurity    Worry: Never true    Inability: Never true  . Transportation needs    Medical: No    Non-medical: No  Tobacco Use  . Smoking status: Former Smoker    Years: 17.00    Types: Cigars    Quit date: 12/31/2014    Years since quitting: 3.7  . Smokeless tobacco: Never Used  Substance and Sexual Activity  . Alcohol use: Yes    Alcohol/week: 0.0 standard drinks    Comment: occasional glass of wine.  . Drug use: No  . Sexual activity: Yes    Birth control/protection: None  Lifestyle  . Physical activity    Days per week: 5 days    Minutes per session: 30 min  . Stress: Only a little  Relationships  . Social connections    Talks on phone: More than three times a week    Gets together: More than three times a week    Attends religious service: More than 4 times per year    Active member of club or organization: Yes    Attends meetings of clubs or organizations: 1 to 4 times per year    Relationship status: Married  . Intimate partner violence    Fear of current or ex partner: No    Emotionally abused: No    Physically abused: No    Forced sexual activity: No  Other Topics Concern  . Not on file  Social History Narrative  . Not on file    FAMILY HISTORY:  Family History  Problem Relation Age of Onset  . Diabetes Mother   . Heart disease Mother        later in life, age >25  . Heart disease Father        heart failure later in life  . Hypertension Father   . Stroke Sister   . Kidney disease Daughter   . Sudden  death Daughter   . Colon cancer Paternal Uncle        Passed from colon CA  . Heart attack Neg Hx     CURRENT MEDICATIONS:  Outpatient Encounter Medications as of 10/06/2018  Medication Sig  . acetaminophen (TYLENOL) 500 MG tablet Take 1,000 mg by mouth 2 (two) times daily as needed for moderate pain.  Marland Kitchen albuterol (PROVENTIL) (2.5 MG/3ML) 0.083% nebulizer solution USE ONE VIAL IN NEBULIZER EVERY  6 HOURS AS NEEDED FOR WHEEZING FOR SHORTNESS OF BREATH  . amLODipine (NORVASC) 10 MG tablet Take 1 tablet (10 mg total) by mouth daily.  Marland Kitchen aspirin EC 81 MG tablet Take 1 tablet (81 mg total) by mouth daily.  Marland Kitchen atorvastatin (LIPITOR) 40 MG tablet TAKE 1 TABLET BY MOUTH ONCE DAILY IN THE EVENING  . carvedilol (COREG) 25 MG tablet Take 25 mg by mouth 2 (two) times daily with a meal.  . Cholecalciferol ([employer] VITAMIN D3) 1000 UNITS capsule Take 2 capsules (2,000 Units total) by mouth daily.  . Cinnamon 500 MG capsule Take 1,000 mg by mouth 2 (two) times daily.   . Coenzyme Q10 (CO Q 10 PO) Take 200 mg by mouth daily.   . fluticasone (FLONASE) 50 MCG/ACT nasal spray Place 2 sprays into both nostrils daily as needed for allergies or rhinitis (SEASONAL ALLERGIES).  . Fluticasone-Salmeterol (ADVAIR DISKUS) 100-50 MCG/DOSE AEPB Inhale 1 puff into the lungs 2 (two) times daily.  . furosemide (LASIX) 40 MG tablet TAKE 1 & 1/2 (ONE & ONE-HALF) TABLETS BY MOUTH TWICE DAILY  . glucose blood (ONE TOUCH ULTRA TEST) test strip Use to test blood sugar three times daily. Dx: E11.21  . hydrALAZINE (APRESOLINE) 100 MG tablet Take 100 mg by mouth 2 (two) times daily.  . insulin detemir (LEVEMIR) 100 UNIT/ML injection Inject 30 Units into the skin daily as needed.   . isosorbide mononitrate (IMDUR) 30 MG 24 hr tablet Take 1 tablet (30 mg total) by mouth daily.  . montelukast (SINGULAIR) 10 MG tablet Take 1 tablet (10 mg total) by mouth at bedtime.  . Multiple Vitamins-Minerals (ONE-A-DAY MENS 50+ ADVANTAGE) TABS Take 1  tablet by mouth daily.  Marland Kitchen PROAIR HFA 108 (90 Base) MCG/ACT inhaler INHALE 2 PUFFS INTO LUNGS EVERY 6 HOURS AS NEEDED FOR SHORTNESS OF BREATH  . nitroGLYCERIN (NITROSTAT) 0.4 MG SL tablet Place 1 tablet (0.4 mg total) under the tongue every 5 (five) minutes as needed for chest pain. (Patient not taking: Reported on 10/06/2018)   Facility-Administered Encounter Medications as of 10/06/2018  Medication  . regadenoson (LEXISCAN) injection SOLN 0.4 mg    ALLERGIES:  No Known Allergies   PHYSICAL EXAM:  ECOG Performance status: 1  Vitals:   10/06/18 0957  BP: (!) 154/76  Pulse: 74  Resp: 18  Temp: (!) 97.5 F (36.4 C)  SpO2: 97%   Filed Weights   10/06/18 0957  Weight: 213 lb 6.4 oz (96.8 kg)    Physical Exam Constitutional:      Appearance: Normal appearance. He is normal weight.  Cardiovascular:     Rate and Rhythm: Normal rate and regular rhythm.     Heart sounds: Normal heart sounds.  Pulmonary:     Effort: Pulmonary effort is normal.     Breath sounds: Normal breath sounds.  Abdominal:     General: There is no distension.     Palpations: Abdomen is soft. There is no mass.  Musculoskeletal: Normal range of motion.     Right lower leg: Edema present.     Left lower leg: Edema present.  Skin:    General: Skin is warm and dry.  Neurological:     Mental Status: He is alert and oriented to person, place, and time. Mental status is at baseline.  Psychiatric:        Mood and Affect: Mood normal.        Behavior: Behavior normal.      LABORATORY DATA:  I  have reviewed the labs as listed.  CBC    Component Value Date/Time   WBC 5.3 09/29/2018 0940   RBC 2.58 (L) 09/29/2018 0940   HGB 8.2 (L) 09/29/2018 0940   HGB 10.0 (L) 04/01/2018 1019   HCT 25.6 (L) 09/29/2018 0940   HCT 28.6 (L) 04/01/2018 1019   PLT 261 09/29/2018 0940   PLT 290 04/01/2018 1019   MCV 99.2 09/29/2018 0940   MCV 96 04/01/2018 1019   MCH 31.8 09/29/2018 0940   MCHC 32.0 09/29/2018 0940    RDW 13.4 09/29/2018 0940   RDW 12.2 04/01/2018 1019   LYMPHSABS 0.5 (L) 09/29/2018 0940   LYMPHSABS 1.1 11/15/2016 0815   MONOABS 0.5 09/29/2018 0940   EOSABS 0.2 09/29/2018 0940   EOSABS 0.2 11/15/2016 0815   BASOSABS 0.0 09/29/2018 0940   BASOSABS 0.0 11/15/2016 0815   CMP Latest Ref Rng & Units 09/29/2018 09/23/2018 09/16/2018  Glucose 70 - 99 mg/dL 126(H) 254(H) 117(H)  BUN 8 - 23 mg/dL 73(H) 90(H) 86(H)  Creatinine 0.61 - 1.24 mg/dL 5.80(H) 6.16(H) 6.10(H)  Sodium 135 - 145 mmol/L 140 139 136  Potassium 3.5 - 5.1 mmol/L 4.0 4.3 4.6  Chloride 98 - 111 mmol/L 102 105 100  CO2 22 - 32 mmol/L 21(L) 24 25  Calcium 8.9 - 10.3 mg/dL 9.2 9.0 8.9  Total Protein 6.5 - 8.1 g/dL - 6.4(L) 6.5  Total Bilirubin 0.3 - 1.2 mg/dL - 0.6 0.5  Alkaline Phos 38 - 126 U/L - 42 44  AST 15 - 41 U/L - 24 21  ALT 0 - 44 U/L - 19 15       DIAGNOSTIC IMAGING:  I have independently reviewed the scans and discussed with the patient.    ASSESSMENT & PLAN:   MGUS (monoclonal gammopathy of unknown significance) 1.  IgG lambda plasma cell disorder: -SPEP on 09/16/2018 shows M spike of 0.3 g/dL.  Free lambda light chains are 145, kappa light chains 160 with ratio of 1.11.  Lambda light chains were previously 71 on 02/10/2018. -Bone marrow biopsy was ordered at last visit secondary to worsening renal function. - Bone marrow biopsy on 09/29/2018 showed hypercellular marrow with 10% lambda restricted plasma cells.  Myeloma FISH is pending. -I had a prolonged discussion about the findings on the bone marrow biopsy.  I have recommended doing a PET CT scan given 10% plasma cells and worsening kidney function. -We will also coordinate this process with Dr. Posey Pronto at [facility]. -I will see him back after the PET CT scan.  If there are any positive bone lesions on PET scan or if there is no other reason for worsening kidney function, will consider treatment for myeloma.  2.  Lower extremity edema: -He is  taking Lasix 60 mg twice daily.  He has been on this dose for a while.  In July increased dose to 120 mg twice daily for 5 days.  3.  Normocytic anemia: -This is from combination of CKD and relative iron deficiency. -Feraheme was on 09/10/2018 and 09/17/2018.  He received Retacrit 10,000 units on 09/10/2018 and 20,000 units on 09/24/2018. -Hemoglobin last week was 8.2.  We will continue erythropoiesis stimulating agents.      Total time spent is 40 minutes with more than 50% of the time spent face-to-face discussing bone marrow biopsy results, further work-up, counseling and coordination of care.    Orders placed this encounter:  Orders Placed This Encounter  Procedures  . NM PET Image  Initial (PI) Skull Base To Thigh  . Lactate dehydrogenase  . Beta 2 microglobuline, serum  . Immunofixation electrophoresis  . CBC with Differential/Platelet  . Comprehensive metabolic panel      Derek Jack, MD [place] [phone]

## 2018-10-06 NOTE — Assessment & Plan Note (Signed)
1.  IgG lambda plasma cell disorder: -SPEP on 09/16/2018 shows M spike of 0.3 g/dL.  Free lambda light chains are 145, kappa light chains 160 with ratio of 1.11.  Lambda light chains were previously 71 on 02/10/2018. -Bone marrow biopsy was ordered at last visit secondary to worsening renal function. - Bone marrow biopsy on 09/29/2018 showed hypercellular marrow with 10% lambda restricted plasma cells.  Myeloma FISH is pending. -I had a prolonged discussion about the findings on the bone marrow biopsy.  I have recommended doing a PET CT scan given 10% plasma cells and worsening kidney function. -We will also coordinate this process with Dr. Posey Pronto at [facility]. -I will see him back after the PET CT scan.  If there are any positive bone lesions on PET scan or if there is no other reason for worsening kidney function, will consider treatment for myeloma.  2.  Lower extremity edema: -He is taking Lasix 60 mg twice daily.  He has been on this dose for a while.  In July increased dose to 120 mg twice daily for 5 days.  3.  Normocytic anemia: -This is from combination of CKD and relative iron deficiency. -Feraheme was on 09/10/2018 and 09/17/2018.  He received Retacrit 10,000 units on 09/10/2018 and 20,000 units on 09/24/2018. -Hemoglobin last week was 8.2.  We will continue erythropoiesis stimulating agents.

## 2018-10-06 NOTE — Patient Instructions (Signed)
[facility] at [facility] Discharge Instructions  You were seen today by Dr. Katragadda. He went over your recent lab results. He will schedule you for a PET scan. He will see you back after your scan for follow up.   Thank you for choosing [facility] at [facility] to provide your oncology and hematology care.  To afford each patient quality time with our provider, please arrive at least 15 minutes before your scheduled appointment time.   If you have a lab appointment with the Cancer Center please come in thru the  Main Entrance and check in at the main information desk  You need to re-schedule your appointment should you arrive 10 or more minutes late.  We strive to give you quality time with our providers, and arriving late affects you and other patients whose appointments are after yours.  Also, if you no show three or more times for appointments you may be dismissed from the clinic at the providers discretion.     Again, thank you for choosing [facility].  Our hope is that these requests will decrease the amount of time that you wait before being seen by our physicians.       _____________________________________________________________  Should you have questions after your visit to [facility], please contact our office at [phone] between the hours of 8:00 a.m. and 4:30 p.m.  Voicemails left after 4:00 p.m. will not be returned until the following business day.  For prescription refill requests, have your pharmacy contact our office and allow 72 hours.    Cancer Center Support Programs:   > Cancer Support Group  2nd Tuesday of the month 1pm-2pm, Journey Room    

## 2018-10-06 NOTE — Progress Notes (Signed)
I emailed cytology and ordered myeloma fish panel on   Henry Carroll DOB [birth year] MRN: [id number] Accession # [id number]

## 2018-10-08 ENCOUNTER — Encounter (HOSPITAL_COMMUNITY): Payer: Self-pay

## 2018-10-08 ENCOUNTER — Encounter (HOSPITAL_COMMUNITY): Payer: Self-pay | Admitting: Hematology

## 2018-10-08 ENCOUNTER — Other Ambulatory Visit: Payer: Self-pay

## 2018-10-08 ENCOUNTER — Encounter (HOSPITAL_COMMUNITY)
Admission: RE | Admit: 2018-10-08 | Discharge: 2018-10-08 | Disposition: A | Discharge status: Home / Self Care | Payer: Medicare Other | Source: Ambulatory Visit | Attending: Nephrology | Admitting: Nephrology

## 2018-10-08 DIAGNOSIS — D631 Anemia in chronic kidney disease: Secondary | ICD-10-CM | POA: Diagnosis not present

## 2018-10-08 DIAGNOSIS — N184 Chronic kidney disease, stage 4 (severe): Secondary | ICD-10-CM | POA: Diagnosis not present

## 2018-10-08 LAB — FERRITIN: Ferritin: 347 ng/mL — ABNORMAL HIGH (ref 24–336)

## 2018-10-08 LAB — POCT HEMOGLOBIN-HEMACUE: Hemoglobin: 8.1 g/dL — ABNORMAL LOW (ref 13.0–17.0)

## 2018-10-08 LAB — IRON AND TIBC
Iron: 40 ug/dL — ABNORMAL LOW (ref 45–182)
Saturation Ratios: 13 % — ABNORMAL LOW (ref 17.9–39.5)
TIBC: 297 ug/dL (ref 250–450)
UIBC: 257 ug/dL

## 2018-10-08 LAB — CBC WITH DIFFERENTIAL/PLATELET
Abs Immature Granulocytes: 0.01 10*3/uL (ref 0.00–0.07)
Basophils Absolute: 0 10*3/uL (ref 0.0–0.1)
Basophils Relative: 1 %
Eosinophils Absolute: 0.2 10*3/uL (ref 0.0–0.5)
Eosinophils Relative: 5 %
HCT: 26.9 % — ABNORMAL LOW (ref 39.0–52.0)
Hemoglobin: 8.6 g/dL — ABNORMAL LOW (ref 13.0–17.0)
Immature Granulocytes: 0 %
Lymphocytes Relative: 16 %
Lymphs Abs: 0.7 10*3/uL (ref 0.7–4.0)
MCH: 31.7 pg (ref 26.0–34.0)
MCHC: 32 g/dL (ref 30.0–36.0)
MCV: 99.3 fL (ref 80.0–100.0)
Monocytes Absolute: 0.5 10*3/uL (ref 0.1–1.0)
Monocytes Relative: 12 %
Neutro Abs: 2.9 10*3/uL (ref 1.7–7.7)
Neutrophils Relative %: 66 %
Platelets: 230 10*3/uL (ref 150–400)
RBC: 2.71 MIL/uL — ABNORMAL LOW (ref 4.22–5.81)
RDW: 13.5 % (ref 11.5–15.5)
WBC: 4.4 10*3/uL (ref 4.0–10.5)
nRBC: 0 % (ref 0.0–0.2)

## 2018-10-08 LAB — RENAL FUNCTION PANEL
Albumin: 3.2 g/dL — ABNORMAL LOW (ref 3.5–5.0)
Anion gap: 9 (ref 5–15)
BUN: 90 mg/dL — ABNORMAL HIGH (ref 8–23)
CO2: 27 mmol/L (ref 22–32)
Calcium: 8.8 mg/dL — ABNORMAL LOW (ref 8.9–10.3)
Chloride: 102 mmol/L (ref 98–111)
Creatinine, Ser: 6.32 mg/dL — ABNORMAL HIGH (ref 0.61–1.24)
GFR calc Af Amer: 9 mL/min — ABNORMAL LOW (ref >60)
GFR calc non Af Amer: 8 mL/min — ABNORMAL LOW (ref >60)
Glucose, Bld: 160 mg/dL — ABNORMAL HIGH (ref 70–99)
Phosphorus: 4.6 mg/dL (ref 2.5–4.6)
Potassium: 4.7 mmol/L (ref 3.5–5.1)
Sodium: 138 mmol/L (ref 135–145)

## 2018-10-08 MED ORDER — EPOETIN ALFA-EPBX 10000 UNIT/ML IJ SOLN
10000.0000 [IU] | Freq: Once | INTRAMUSCULAR | Status: DC
  Filled 2018-10-08: qty 1

## 2018-10-08 MED ORDER — EPOETIN ALFA-EPBX 10000 UNIT/ML IJ SOLN
20000.0000 [IU] | Freq: Once | INTRAMUSCULAR | Status: AC
  Administered 2018-10-08: 20000 [IU] via SUBCUTANEOUS

## 2018-10-09 ENCOUNTER — Ambulatory Visit (HOSPITAL_COMMUNITY): Payer: Medicare Other | Admitting: Nurse Practitioner

## 2018-10-13 ENCOUNTER — Other Ambulatory Visit: Payer: Self-pay

## 2018-10-13 ENCOUNTER — Encounter (HOSPITAL_COMMUNITY): Payer: Self-pay | Admitting: Hematology

## 2018-10-13 ENCOUNTER — Encounter (HOSPITAL_COMMUNITY)
Admission: RE | Admit: 2018-10-13 | Discharge: 2018-10-13 | Disposition: A | Discharge status: Home / Self Care | Payer: Medicare Other | Source: Ambulatory Visit | Attending: Hematology | Admitting: Hematology

## 2018-10-13 DIAGNOSIS — C9000 Multiple myeloma not having achieved remission: Secondary | ICD-10-CM | POA: Diagnosis not present

## 2018-10-13 MED ORDER — FLUDEOXYGLUCOSE F - 18 (FDG) INJECTION
11.8400 | Freq: Once | INTRAVENOUS | Status: AC | PRN
  Administered 2018-10-13: 11.84 via INTRAVENOUS

## 2018-10-14 ENCOUNTER — Inpatient Hospital Stay (HOSPITAL_COMMUNITY): Payer: Medicare Other | Attending: Hematology | Admitting: Hematology

## 2018-10-14 ENCOUNTER — Encounter (HOSPITAL_COMMUNITY): Payer: Self-pay | Admitting: Hematology

## 2018-10-14 ENCOUNTER — Other Ambulatory Visit: Payer: Self-pay

## 2018-10-14 ENCOUNTER — Inpatient Hospital Stay (HOSPITAL_COMMUNITY): Payer: Medicare Other

## 2018-10-14 ENCOUNTER — Ambulatory Visit (HOSPITAL_COMMUNITY): Payer: Medicare Other | Admitting: Nurse Practitioner

## 2018-10-14 VITALS — BP 155/76 | HR 70 | Temp 97.9°F | Resp 16 | Wt 219.0 lb

## 2018-10-14 DIAGNOSIS — D631 Anemia in chronic kidney disease: Secondary | ICD-10-CM | POA: Diagnosis not present

## 2018-10-14 DIAGNOSIS — N183 Chronic kidney disease, stage 3 (moderate): Secondary | ICD-10-CM | POA: Insufficient documentation

## 2018-10-14 DIAGNOSIS — D472 Monoclonal gammopathy: Secondary | ICD-10-CM | POA: Insufficient documentation

## 2018-10-14 DIAGNOSIS — C9000 Multiple myeloma not having achieved remission: Secondary | ICD-10-CM

## 2018-10-14 LAB — CBC WITH DIFFERENTIAL/PLATELET
Abs Immature Granulocytes: 0.01 10*3/uL (ref 0.00–0.07)
Basophils Absolute: 0 10*3/uL (ref 0.0–0.1)
Basophils Relative: 1 %
Eosinophils Absolute: 0.3 10*3/uL (ref 0.0–0.5)
Eosinophils Relative: 7 %
HCT: 28.9 % — ABNORMAL LOW (ref 39.0–52.0)
Hemoglobin: 9.3 g/dL — ABNORMAL LOW (ref 13.0–17.0)
Immature Granulocytes: 0 %
Lymphocytes Relative: 18 %
Lymphs Abs: 0.8 10*3/uL (ref 0.7–4.0)
MCH: 32 pg (ref 26.0–34.0)
MCHC: 32.2 g/dL (ref 30.0–36.0)
MCV: 99.3 fL (ref 80.0–100.0)
Monocytes Absolute: 0.6 10*3/uL (ref 0.1–1.0)
Monocytes Relative: 13 %
Neutro Abs: 2.7 10*3/uL (ref 1.7–7.7)
Neutrophils Relative %: 61 %
Platelets: 263 10*3/uL (ref 150–400)
RBC: 2.91 MIL/uL — ABNORMAL LOW (ref 4.22–5.81)
RDW: 13.6 % (ref 11.5–15.5)
WBC: 4.4 10*3/uL (ref 4.0–10.5)
nRBC: 0 % (ref 0.0–0.2)

## 2018-10-14 LAB — COMPREHENSIVE METABOLIC PANEL
ALT: 15 U/L (ref 0–44)
AST: 17 U/L (ref 15–41)
Albumin: 3.4 g/dL — ABNORMAL LOW (ref 3.5–5.0)
Alkaline Phosphatase: 51 U/L (ref 38–126)
Anion gap: 11 (ref 5–15)
BUN: 92 mg/dL — ABNORMAL HIGH (ref 8–23)
CO2: 24 mmol/L (ref 22–32)
Calcium: 8.9 mg/dL (ref 8.9–10.3)
Chloride: 102 mmol/L (ref 98–111)
Creatinine, Ser: 6.19 mg/dL — ABNORMAL HIGH (ref 0.61–1.24)
GFR calc Af Amer: 10 mL/min — ABNORMAL LOW (ref >60)
GFR calc non Af Amer: 8 mL/min — ABNORMAL LOW (ref >60)
Glucose, Bld: 179 mg/dL — ABNORMAL HIGH (ref 70–99)
Potassium: 4.4 mmol/L (ref 3.5–5.1)
Sodium: 137 mmol/L (ref 135–145)
Total Bilirubin: 0.2 mg/dL — ABNORMAL LOW (ref 0.3–1.2)
Total Protein: 6.4 g/dL — ABNORMAL LOW (ref 6.5–8.1)

## 2018-10-14 LAB — FERRITIN: Ferritin: 262 ng/mL (ref 24–336)

## 2018-10-14 LAB — VITAMIN B12: Vitamin B-12: 1160 pg/mL — ABNORMAL HIGH (ref 180–914)

## 2018-10-14 LAB — IRON AND TIBC
Iron: 40 ug/dL — ABNORMAL LOW (ref 45–182)
Saturation Ratios: 14 % — ABNORMAL LOW (ref 17.9–39.5)
TIBC: 290 ug/dL (ref 250–450)
UIBC: 250 ug/dL

## 2018-10-14 LAB — LACTATE DEHYDROGENASE: LDH: 196 U/L — ABNORMAL HIGH (ref 98–192)

## 2018-10-14 NOTE — Assessment & Plan Note (Addendum)
1.  IgG lambda plasma cell disorder: -SPEP on 09/16/2018 shows M spike of 0.3 g/dL.  Free lambda light chains are 145, kappa light chains are 160 with ratio of 1.11.  Lambda light chains were previously at 71 on 02/10/2018. -Bone marrow biopsy done secondary to worsening kidney function.  It showed hypercellular marrow with 10% lambda restricted plasma cells.  Chromosome analysis was 46, XY.  FISH panel was normal. - PET CT scan on 10/13/2018 did not show any evidence of myeloma or plasmacytoma. - I have communicated with Dr. Posey Pronto at [facility] last week.  He will see the patient and arrange for a kidney biopsy to rule out myeloma kidney. - I have recommended a follow-up visit in 6 weeks to discuss the results.  2.  Lower extremity edema: - He is continuing Lasix 60 mg twice daily.  3.  Normocytic anemia: -This is from combination of CKD and relative iron deficiency. -Feraheme on 09/10/2018 and 09/17/2018. -Retacrit (every 2 weeks) 10,000 units on 09/10/2018, 20,000 units on 09/24/2018 and 10/08/2018. -Hemoglobin improved to 9.3 today.  Ferritin and iron panel are pending.

## 2018-10-14 NOTE — Patient Instructions (Signed)
[facility] at [facility] Discharge Instructions  You were seen today by Dr. Katragadda. He went over your recent lab results. He will see you back in 6 weeks for labs and follow up.   Thank you for choosing [facility] at [facility] to provide your oncology and hematology care.  To afford each patient quality time with our provider, please arrive at least 15 minutes before your scheduled appointment time.   If you have a lab appointment with the Cancer Center please come in thru the  Main Entrance and check in at the main information desk  You need to re-schedule your appointment should you arrive 10 or more minutes late.  We strive to give you quality time with our providers, and arriving late affects you and other patients whose appointments are after yours.  Also, if you no show three or more times for appointments you may be dismissed from the clinic at the providers discretion.     Again, thank you for choosing [facility].  Our hope is that these requests will decrease the amount of time that you wait before being seen by our physicians.       _____________________________________________________________  Should you have questions after your visit to  Cancer Center, please contact our office at [phone] between the hours of 8:00 a.m. and 4:30 p.m.  Voicemails left after 4:00 p.m. will not be returned until the following business day.  For prescription refill requests, have your pharmacy contact our office and allow 72 hours.    Cancer Center Support Programs:   > Cancer Support Group  2nd Tuesday of the month 1pm-2pm, Journey Room    

## 2018-10-14 NOTE — [place] Notes (Signed)
Henry Carroll, Henry Carroll [id number]   CLINIC:  Medical Oncology/Hematology  PCP:  Henry Chandler, NP [place] [address] [phone]   REASON FOR VISIT: Follow-up for iron deficiency anemia and MGUS  CURRENT THERAPY:  Intermittent iron therapy and Retacrit.   INTERVAL HISTORY:  Henry Carroll 68 y.o. male seen for follow-up of plasma cell disorder and anemia.  Denies any bleeding per rectum or melena.  Denies any new onset bone pains.  Appetite is 75%.  Energy levels are 50%.  Denies any fevers, night sweats or weight loss in the last 6 months.  Denies nausea, vomiting, diarrhea or constipation.  No ER visits or hospitalizations.  Had a PET CT scan done yesterday.  REVIEW OF SYSTEMS:  Review of Systems  Psychiatric/Behavioral: Positive for sleep disturbance.  All other systems reviewed and are negative.    PAST MEDICAL/SURGICAL HISTORY:  Past Medical History:  Diagnosis Date  . Anemia   . 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 Asthma   . Chronic combined systolic (congestive) and diastolic (congestive) heart failure ([place])    a. 12/31/14: 2D ECHO: EF 40-45%, HK of inf myocardium, G1DD, mod MR  . CKD (chronic kidney disease), stage III (HCC)    stage 3 kidney disease  . Coronary artery disease    a. LHC 01/2015 - triple vessel CAD (mod oLM, mLAD, severe mRCA, intermediate branch stenosis, CTO of mCx). Plan CABG 02/2015.  Marland Kitchen GERD (gastroesophageal reflux disease)   . Hyperlipidemia   . Hypertension   . Mitral regurgitation    a. Mild-mod by echo 12/2014.  Marland Kitchen Myocardial infarction [facility])    pt. states per [name] he has in the past  . NSVT (nonsustained ventricular tachycardia) ([place])    a. 9 beats during 01/2015 adm. BB titrated.  . Obesity    a. BMI 33  . OSA (obstructive sleep apnea) 01/16/2018   Mild obstructive sleep apnea overall with an AHI of 7.3/h and no significant central sleep apnea. Severe obstructive sleep  apnea during REM sleep with an AHI of 34.3/h.  Now on CPAP at 6 cm H2O.   . Sleep apnea    2019, Dr.Turner diagnoised   . Type II diabetes mellitus ([place])    Past Surgical History:  Procedure Laterality Date  . BACK SURGERY    . CARDIAC CATHETERIZATION N/A 02/09/2015   Procedure: Left Heart Cath and Coronary Angiography;  Surgeon: Burnell Blanks, MD;  Location: [facility];  Service: Cardiovascular;  Laterality: N/A;  . COLONOSCOPY  2011   Dr. [name]: Ileocecal valve appeared normal, scattered pancolonic diverticulosis, difficult bowel prep making smaller lesions potentially missed. Recommended three-year follow-up colonoscopy.  . COLONOSCOPY  2003   Dr. [name]: Suspicious lesion at the ileocecal valve, multiple biopsies benign, pancolonic diverticulosis.  . COLONOSCOPY N/A 02/15/2016   diverticulosis in sigmoid and descending colon, single 5 mm polyp at splenic flexure (Tubular adenoma)  . CORONARY ARTERY BYPASS GRAFT N/A 02/18/2015   Procedure: CORONARY ARTERY BYPASS GRAFTING (CABG) x  four, using bilateral internal mammary arteries and right leg greater saphenous vein harvested endoscopically;  Surgeon: [name], MD;  Location: [facility];  Service: Open Heart Surgery;  Laterality: N/A;  . ESOPHAGOGASTRODUODENOSCOPY N/A 02/15/2016   normal  . EYE SURGERY Right    July 2019   . GIVENS CAPSULE STUDY N/A 01/08/2017   couple of gastric and small bowel eroions in  setting of aspirin 81 mg daily but nothing concerning, continue Hematology follow-up  . HERNIA REPAIR  0355   Umbilical  . LUMBAR [place]ita SURGERY  2006   L4 & L5  . REFRACTIVE SURGERY Left    2019   . REFRACTIVE SURGERY Right    2019  . TEE WITHOUT CARDIOVERSION N/A 02/18/2015   Procedure: TRANSESOPHAGEAL ECHOCARDIOGRAM (TEE);  Surgeon: [name], MD;  Location: [facility];  Service: Open Heart Surgery;  Laterality: N/A;  . TONSILLECTOMY  1962     SOCIAL HISTORY:  Social History   Socioeconomic History  . Marital  status: Married    Spouse name: Not on file  . Number of children: Not on file  . Years of education: Not on file  . Highest education level: Not on file  Occupational History  . Not on file  Social Needs  . Financial resource strain: Not hard at all  . Food insecurity    Worry: Never true    Inability: Never true  . Transportation needs    Medical: No    Non-medical: No  Tobacco Use  . Smoking status: Former Smoker    Years: 17.00    Types: Cigars    Quit date: 12/31/2014    Years since quitting: 3.7  . Smokeless tobacco: Never Used  Substance and Sexual Activity  . Alcohol use: Yes    Alcohol/week: 0.0 standard drinks    Comment: occasional glass of wine.  . Drug use: No  . Sexual activity: Yes    Birth control/protection: None  Lifestyle  . Physical activity    Days per week: 5 days    Minutes per session: 30 min  . Stress: Only a little  Relationships  . Social connections    Talks on phone: More than three times a week    Gets together: More than three times a week    Attends religious service: More than 4 times per year    Active member of club or organization: Yes    Attends meetings of clubs or organizations: 1 to 4 times per year    Relationship status: Married  . Intimate partner violence    Fear of current or ex partner: No    Emotionally abused: No    Physically abused: No    Forced sexual activity: No  Other Topics Concern  . Not on file  Social History Narrative  . Not on file    FAMILY HISTORY:  Family History  Problem Relation Age of Onset  . Diabetes Mother   . Heart disease Mother        later in life, age >18  . Heart disease Father        heart failure later in life  . Hypertension Father   . Stroke Sister   . Kidney disease Daughter   . Sudden death Daughter   . Colon cancer Paternal Uncle        Passed from colon CA  . Heart attack Neg Hx     CURRENT MEDICATIONS:  Outpatient Encounter Medications as of 10/14/2018  Medication  Sig  . amLODipine (NORVASC) 10 MG tablet Take 1 tablet (10 mg total) by mouth daily.  Marland Kitchen aspirin EC 81 MG tablet Take 1 tablet (81 mg total) by mouth daily.  Marland Kitchen atorvastatin (LIPITOR) 40 MG tablet TAKE 1 TABLET BY MOUTH ONCE DAILY IN THE EVENING  . carvedilol (COREG) 25 MG tablet Take 25 mg by mouth 2 (two) times daily  with a meal.  . Cholecalciferol ([employer] VITAMIN D3) 1000 UNITS capsule Take 2 capsules (2,000 Units total) by mouth daily.  . Cinnamon 500 MG capsule Take 1,000 mg by mouth 2 (two) times daily.   . Coenzyme Q10 (CO Q 10 PO) Take 200 mg by mouth daily.   . furosemide (LASIX) 40 MG tablet TAKE 1 & 1/2 (ONE & ONE-HALF) TABLETS BY MOUTH TWICE DAILY  . glucose blood (ONE TOUCH ULTRA TEST) test strip Use to test blood sugar three times daily. Dx: E11.21  . hydrALAZINE (APRESOLINE) 100 MG tablet Take 100 mg by mouth 2 (two) times daily.  . insulin detemir (LEVEMIR) 100 UNIT/ML injection Inject 30 Units into the skin daily as needed.   . isosorbide mononitrate (IMDUR) 30 MG 24 hr tablet Take 1 tablet (30 mg total) by mouth daily.  . montelukast (SINGULAIR) 10 MG tablet Take 1 tablet (10 mg total) by mouth at bedtime.  . Multiple Vitamins-Minerals (ONE-A-DAY MENS 50+ ADVANTAGE) TABS Take 1 tablet by mouth daily.  Marland Kitchen acetaminophen (TYLENOL) 500 MG tablet Take 1,000 mg by mouth 2 (two) times daily as needed for moderate pain.  Marland Kitchen albuterol (PROVENTIL) (2.5 MG/3ML) 0.083% nebulizer solution USE ONE VIAL IN NEBULIZER EVERY 6 HOURS AS NEEDED FOR WHEEZING FOR SHORTNESS OF BREATH (Patient not taking: Reported on 10/14/2018)  . fluticasone (FLONASE) 50 MCG/ACT nasal spray Place 2 sprays into both nostrils daily as needed for allergies or rhinitis (SEASONAL ALLERGIES).  . Fluticasone-Salmeterol (ADVAIR DISKUS) 100-50 MCG/DOSE AEPB Inhale 1 puff into the lungs 2 (two) times daily. (Patient not taking: Reported on 10/14/2018)  . nitroGLYCERIN (NITROSTAT) 0.4 MG SL tablet Place 1 tablet (0.4 mg total) under the  tongue every 5 (five) minutes as needed for chest pain. (Patient not taking: Reported on 10/06/2018)  . PROAIR HFA 108 (90 Base) MCG/ACT inhaler INHALE 2 PUFFS INTO LUNGS EVERY 6 HOURS AS NEEDED FOR SHORTNESS OF BREATH (Patient not taking: Reported on 10/14/2018)   Facility-Administered Encounter Medications as of 10/14/2018  Medication  . regadenoson (LEXISCAN) injection SOLN 0.4 mg    ALLERGIES:  No Known Allergies   PHYSICAL EXAM:  ECOG Performance status: 1  Vitals:   10/14/18 1527  BP: (!) 155/76  Pulse: 70  Resp: 16  Temp: 97.9 F (36.6 C)  SpO2: 97%   Filed Weights   10/14/18 1527  Weight: 219 lb (99.3 kg)    Physical Exam Constitutional:      Appearance: Normal appearance. He is normal weight.  Cardiovascular:     Rate and Rhythm: Normal rate and regular rhythm.     Heart sounds: Normal heart sounds.  Pulmonary:     Effort: Pulmonary effort is normal.     Breath sounds: Normal breath sounds.  Abdominal:     General: There is no distension.     Palpations: Abdomen is soft. There is no mass.  Musculoskeletal: Normal range of motion.     Right lower leg: Edema present.     Left lower leg: Edema present.  Skin:    General: Skin is warm and dry.  Neurological:     Mental Status: He is alert and oriented to person, place, and time. Mental status is at baseline.  Psychiatric:        Mood and Affect: Mood normal.        Behavior: Behavior normal.      LABORATORY DATA:  I have reviewed the labs as listed.  CBC    Component Value Date/Time  WBC 4.4 10/14/2018 1430   RBC 2.91 (L) 10/14/2018 1430   HGB 9.3 (L) 10/14/2018 1430   HGB 10.0 (L) 04/01/2018 1019   HCT 28.9 (L) 10/14/2018 1430   HCT 28.6 (L) 04/01/2018 1019   PLT 263 10/14/2018 1430   PLT 290 04/01/2018 1019   MCV 99.3 10/14/2018 1430   MCV 96 04/01/2018 1019   MCH 32.0 10/14/2018 1430   MCHC 32.2 10/14/2018 1430   RDW 13.6 10/14/2018 1430   RDW 12.2 04/01/2018 1019   LYMPHSABS 0.8  10/14/2018 1430   LYMPHSABS 1.1 11/15/2016 0815   MONOABS 0.6 10/14/2018 1430   EOSABS 0.3 10/14/2018 1430   EOSABS 0.2 11/15/2016 0815   BASOSABS 0.0 10/14/2018 1430   BASOSABS 0.0 11/15/2016 0815   CMP Latest Ref Rng & Units 10/14/2018 10/08/2018 09/29/2018  Glucose 70 - 99 mg/dL 179(H) 160(H) 126(H)  BUN 8 - 23 mg/dL 92(H) 90(H) 73(H)  Creatinine 0.61 - 1.24 mg/dL 6.19(H) 6.32(H) 5.80(H)  Sodium 135 - 145 mmol/L 137 138 140  Potassium 3.5 - 5.1 mmol/L 4.4 4.7 4.0  Chloride 98 - 111 mmol/L 102 102 102  CO2 22 - 32 mmol/L 24 27 21(L)  Calcium 8.9 - 10.3 mg/dL 8.9 8.8(L) 9.2  Total Protein 6.5 - 8.1 g/dL 6.4(L) - -  Total Bilirubin 0.3 - 1.2 mg/dL 0.2(L) - -  Alkaline Phos 38 - 126 U/L 51 - -  AST 15 - 41 U/L 17 - -  ALT 0 - 44 U/L 15 - -       DIAGNOSTIC IMAGING:  I have independently reviewed the scans and discussed with the patient.    ASSESSMENT & PLAN:   MGUS (monoclonal gammopathy of unknown significance) 1.  IgG lambda plasma cell disorder: -SPEP on 09/16/2018 shows M spike of 0.3 g/dL.  Free lambda light chains are 145, kappa light chains are 160 with ratio of 1.11.  Lambda light chains were previously at 71 on 02/10/2018. -Bone marrow biopsy done secondary to worsening kidney function.  It showed hypercellular marrow with 10% lambda restricted plasma cells.  Chromosome analysis was 46, XY.  FISH panel was normal. - PET CT scan on 10/13/2018 did not show any evidence of myeloma or plasmacytoma. - I have communicated with Dr. Posey Pronto at [facility] last week.  He will see the patient and arrange for a kidney biopsy to rule out myeloma kidney. - I have recommended a follow-up visit in 6 weeks to discuss the results.  2.  Lower extremity edema: - He is continuing Lasix 60 mg twice daily.  3.  Normocytic anemia: -This is from combination of CKD and relative iron deficiency. -Feraheme on 09/10/2018 and 09/17/2018. -Retacrit (every 2 weeks) 10,000 units on 09/10/2018,  20,000 units on 09/24/2018 and 10/08/2018. -Hemoglobin improved to 9.3 today.  Ferritin and iron panel are pending.  Total time spent is 25 minutes with more than 50% of the time spent face-to-face discussing scan results, counseling and coordination of care.    Orders placed this encounter:  Orders Placed This Encounter  Procedures  . CBC with Differential/Platelet  . Comprehensive metabolic panel  . Iron and TIBC  . Ferritin  . Vitamin B12  . Folate      Derek Jack, MD [place] [phone]

## 2018-10-15 ENCOUNTER — Other Ambulatory Visit: Payer: Self-pay | Admitting: Nurse Practitioner

## 2018-10-15 LAB — BETA 2 MICROGLOBULIN, SERUM: Beta-2 Microglobulin: 7.9 mg/L — ABNORMAL HIGH (ref 0.6–2.4)

## 2018-10-15 LAB — IMMUNOFIXATION ELECTROPHORESIS
IgA: 139 mg/dL (ref 61–437)
IgG (Immunoglobin G), Serum: 807 mg/dL (ref 603–1613)
IgM (Immunoglobulin M), Srm: 27 mg/dL (ref 20–172)
Total Protein ELP: 5.8 g/dL — ABNORMAL LOW (ref 6.0–8.5)

## 2018-10-22 ENCOUNTER — Encounter (HOSPITAL_COMMUNITY)
Admission: RE | Admit: 2018-10-22 | Discharge: 2018-10-22 | Disposition: A | Discharge status: Home / Self Care | Payer: Medicare Other | Source: Ambulatory Visit | Attending: Nephrology | Admitting: Nephrology

## 2018-10-22 ENCOUNTER — Encounter (HOSPITAL_COMMUNITY): Payer: Self-pay

## 2018-10-22 ENCOUNTER — Other Ambulatory Visit: Payer: Self-pay

## 2018-10-22 ENCOUNTER — Ambulatory Visit (INDEPENDENT_AMBULATORY_CARE_PROVIDER_SITE_OTHER): Payer: Medicare Other | Admitting: Urology

## 2018-10-22 DIAGNOSIS — N5201 Erectile dysfunction due to arterial insufficiency: Secondary | ICD-10-CM | POA: Diagnosis not present

## 2018-10-22 DIAGNOSIS — N184 Chronic kidney disease, stage 4 (severe): Secondary | ICD-10-CM | POA: Diagnosis not present

## 2018-10-22 DIAGNOSIS — D631 Anemia in chronic kidney disease: Secondary | ICD-10-CM | POA: Diagnosis not present

## 2018-10-22 LAB — POCT HEMOGLOBIN-HEMACUE: Hemoglobin: 8.6 g/dL — ABNORMAL LOW (ref 13.0–17.0)

## 2018-10-22 MED ORDER — EPOETIN ALFA-EPBX 10000 UNIT/ML IJ SOLN
INTRAMUSCULAR | Status: AC
  Filled 2018-10-22: qty 2

## 2018-10-22 MED ORDER — EPOETIN ALFA-EPBX 10000 UNIT/ML IJ SOLN
20000.0000 [IU] | Freq: Once | INTRAMUSCULAR | Status: AC
  Administered 2018-10-22: 13:00:00 20000 [IU] via SUBCUTANEOUS

## 2018-10-23 ENCOUNTER — Emergency Department (HOSPITAL_COMMUNITY): Payer: Medicare Other

## 2018-10-23 ENCOUNTER — Other Ambulatory Visit: Payer: Self-pay

## 2018-10-23 ENCOUNTER — Emergency Department (HOSPITAL_COMMUNITY)
Admission: EM | Admit: 2018-10-23 | Discharge: 2018-10-24 | Disposition: A | Discharge status: Home / Self Care | Payer: Medicare Other | Attending: Emergency Medicine | Admitting: Emergency Medicine

## 2018-10-23 DIAGNOSIS — I110 Hypertensive heart disease with heart failure: Secondary | ICD-10-CM | POA: Diagnosis not present

## 2018-10-23 DIAGNOSIS — Z7982 Long term (current) use of aspirin: Secondary | ICD-10-CM | POA: Diagnosis not present

## 2018-10-23 DIAGNOSIS — Z87891 Personal history of nicotine dependence: Secondary | ICD-10-CM | POA: Diagnosis not present

## 2018-10-23 DIAGNOSIS — I5023 Acute on chronic systolic (congestive) heart failure: Secondary | ICD-10-CM | POA: Insufficient documentation

## 2018-10-23 DIAGNOSIS — R0602 Shortness of breath: Secondary | ICD-10-CM | POA: Diagnosis not present

## 2018-10-23 DIAGNOSIS — I129 Hypertensive chronic kidney disease with stage 1 through stage 4 chronic kidney disease, or unspecified chronic kidney disease: Secondary | ICD-10-CM | POA: Diagnosis not present

## 2018-10-23 DIAGNOSIS — Z7984 Long term (current) use of oral hypoglycemic drugs: Secondary | ICD-10-CM | POA: Insufficient documentation

## 2018-10-23 DIAGNOSIS — E1122 Type 2 diabetes mellitus with diabetic chronic kidney disease: Secondary | ICD-10-CM | POA: Insufficient documentation

## 2018-10-23 DIAGNOSIS — Z79899 Other long term (current) drug therapy: Secondary | ICD-10-CM | POA: Insufficient documentation

## 2018-10-23 DIAGNOSIS — N183 Chronic kidney disease, stage 3 (moderate): Secondary | ICD-10-CM | POA: Insufficient documentation

## 2018-10-23 LAB — BASIC METABOLIC PANEL
Anion gap: 11 (ref 5–15)
BUN: 88 mg/dL — ABNORMAL HIGH (ref 8–23)
CO2: 21 mmol/L — ABNORMAL LOW (ref 22–32)
Calcium: 9 mg/dL (ref 8.9–10.3)
Chloride: 105 mmol/L (ref 98–111)
Creatinine, Ser: 6.08 mg/dL — ABNORMAL HIGH (ref 0.61–1.24)
GFR calc Af Amer: 10 mL/min — ABNORMAL LOW (ref >60)
GFR calc non Af Amer: 9 mL/min — ABNORMAL LOW (ref >60)
Glucose, Bld: 186 mg/dL — ABNORMAL HIGH (ref 70–99)
Potassium: 4.4 mmol/L (ref 3.5–5.1)
Sodium: 137 mmol/L (ref 135–145)

## 2018-10-23 LAB — CBC
HCT: 29.3 % — ABNORMAL LOW (ref 39.0–52.0)
Hemoglobin: 9.2 g/dL — ABNORMAL LOW (ref 13.0–17.0)
MCH: 31.4 pg (ref 26.0–34.0)
MCHC: 31.4 g/dL (ref 30.0–36.0)
MCV: 100 fL (ref 80.0–100.0)
Platelets: 212 10*3/uL (ref 150–400)
RBC: 2.93 MIL/uL — ABNORMAL LOW (ref 4.22–5.81)
RDW: 13.7 % (ref 11.5–15.5)
WBC: 5.4 10*3/uL (ref 4.0–10.5)
nRBC: 0 % (ref 0.0–0.2)

## 2018-10-23 MED ORDER — SODIUM CHLORIDE 0.9% FLUSH
3.0000 mL | Freq: Once | INTRAVENOUS | Status: AC
  Administered 2018-10-24: 03:00:00 3 mL via INTRAVENOUS

## 2018-10-23 NOTE — ED Triage Notes (Signed)
Pt presents today with bilateral leg swelling and SOB due to congestion. Pt has CHF (had MI in 2016, quadruple Bypass).

## 2018-10-24 DIAGNOSIS — I5023 Acute on chronic systolic (congestive) heart failure: Secondary | ICD-10-CM | POA: Diagnosis not present

## 2018-10-24 LAB — BRAIN NATRIURETIC PEPTIDE: B Natriuretic Peptide: 2625.6 pg/mL — ABNORMAL HIGH (ref 0.0–100.0)

## 2018-10-24 MED ORDER — FUROSEMIDE 10 MG/ML IJ SOLN
100.0000 mg | Freq: Once | INTRAVENOUS | Status: AC
  Administered 2018-10-24: 100 mg via INTRAVENOUS
  Filled 2018-10-24: qty 10

## 2018-10-24 NOTE — ED Provider Notes (Signed)
[facility] EMERGENCY DEPARTMENT Provider Note   CSN: [id number] Arrival date & time: 10/23/18  1554     History   Chief Complaint Chief Complaint  Patient presents with  . Shortness of Breath  . Leg Swelling    HPI Henry Carroll is a 68 y.o. male.     HPI  This is a 68 year old male with a history of heart failure, coronary artery disease, hypertension, hyperlipidemia who presents with shortness of breath.  Patient reports worsening shortness of breath over the last 2 to 3 days.  He reports 4 pillow orthopnea.  Normally he is to.  He reports that he has had difficulty sleeping.  Denies any chest pain.  Has had some lower extremity edema.  Unsure of whether he has had weight gain.  Denies any fevers or cough.  No known sick contacts.  No recent changes in medications.  He reports that he takes Lasix twice a day.  He reports good urine output.  Past Medical History:  Diagnosis Date  . Anemia   . 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 Asthma   . Chronic combined systolic (congestive) and diastolic (congestive) heart failure ([place])    a. 12/31/14: 2D ECHO: EF 40-45%, HK of inf myocardium, G1DD, mod MR  . CKD (chronic kidney disease), stage III (HCC)    stage 3 kidney disease  . Coronary artery disease    a. LHC 01/2015 - triple vessel CAD (mod oLM, mLAD, severe mRCA, intermediate branch stenosis, CTO of mCx). Plan CABG 02/2015.  Marland Kitchen GERD (gastroesophageal reflux disease)   . Hyperlipidemia   . Hypertension   . Mitral regurgitation    a. Mild-mod by echo 12/2014.  Marland Kitchen Myocardial infarction [facility])    pt. states per [name] he has in the past  . NSVT (nonsustained ventricular tachycardia) ([place])    a. 9 beats during 01/2015 adm. BB titrated.  . Obesity    a. BMI 33  . OSA (obstructive sleep apnea) 01/16/2018   Mild obstructive sleep apnea overall with an AHI of 7.3/h and no significant central sleep apnea. Severe obstructive sleep apnea during REM sleep with an  AHI of 34.3/h.  Now on CPAP at 6 cm H2O.   . Sleep apnea    2019, Dr.Turner diagnoised   . Type II diabetes mellitus [facility])     Patient Active Problem List   Diagnosis Date Noted  . CKD (chronic kidney disease), stage V ([place]) 08/30/2018  . 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 Symptomatic anemia 08/29/2018  . OSA (obstructive sleep apnea) 01/16/2018  . Small bowel lesion   . Gastric erosion   . Iron deficiency anemia 12/10/2016  . IDA (iron deficiency anemia) 01/23/2016  . Melena 01/23/2016  . 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 Bilateral lower extremity edema 12/22/2015  . Asthma, chronic, mild intermittent, with acute exacerbation 12/22/2015  . PAD (peripheral artery disease) ([place]) 03/02/2015  . Chronic systolic heart failure ([place]) 02/25/2015  . Hypertensive heart disease with heart failure ([place]) 02/25/2015  . CAD (coronary artery disease) 02/18/2015  . CAD in native artery 02/10/2015  . Abnormal stress test 02/10/2015  . Mitral regurgitation 02/10/2015  . Chronic combined systolic and diastolic CHF (congestive heart failure) ([place]) 02/10/2015  . NSVT (nonsustained ventricular tachycardia) ([place]) 02/10/2015  . Acute on chronic systolic and diastolic heart failure, NYHA class 1 ([place]) 12/31/2014  . Hyperlipidemia with target LDL less than 100 12/31/2014  . Demand  ischemia ([place]) 12/31/2014  . Morbid obesity ([place]) 12/29/2014  . Elevated troponin   . Essential hypertension 10/09/2013  . DM type 2, uncontrolled, with renal complications ([place]) 39/53/2023  . Obesity (BMI 30-39.9) 05/05/2013  . Folliculitis of perineum 10/01/2012  . Anemia in chronic kidney disease 09/03/2012  . Other malaise and fatigue 09/03/2012  . 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 Asthma, chronic 06/04/2012  . COLONIC POLYPS, HX OF 04/13/2009    Past Surgical History:  Procedure Laterality Date  . BACK SURGERY    . CARDIAC CATHETERIZATION N/A 02/09/2015    Procedure: Left Heart Cath and Coronary Angiography;  Surgeon: Burnell Blanks, MD;  Location: [facility];  Service: Cardiovascular;  Laterality: N/A;  . COLONOSCOPY  2011   Dr. [name]: Ileocecal valve appeared normal, scattered pancolonic diverticulosis, difficult bowel prep making smaller lesions potentially missed. Recommended three-year follow-up colonoscopy.  . COLONOSCOPY  2003   Dr. [name]: Suspicious lesion at the ileocecal valve, multiple biopsies benign, pancolonic diverticulosis.  . COLONOSCOPY N/A 02/15/2016   diverticulosis in sigmoid and descending colon, single 5 mm polyp at splenic flexure (Tubular adenoma)  . CORONARY ARTERY BYPASS GRAFT N/A 02/18/2015   Procedure: CORONARY ARTERY BYPASS GRAFTING (CABG) x  four, using bilateral internal mammary arteries and right leg greater saphenous vein harvested endoscopically;  Surgeon: [name], MD;  Location: [facility] OR;  Service: Open Heart Surgery;  Laterality: N/A;  . ESOPHAGOGASTRODUODENOSCOPY N/A 02/15/2016   normal  . EYE SURGERY Right    July 2019   . GIVENS CAPSULE STUDY N/A 01/08/2017   couple of gastric and small bowel eroions in setting of aspirin 81 mg daily but nothing concerning, continue Hematology follow-up  . HERNIA REPAIR  3435   Umbilical  . LUMBAR [place] SURGERY  2006   L4 & L5  . REFRACTIVE SURGERY Left    2019   . REFRACTIVE SURGERY Right    2019  . TEE WITHOUT CARDIOVERSION N/A 02/18/2015   Procedure: TRANSESOPHAGEAL ECHOCARDIOGRAM (TEE);  Surgeon: [name], MD;  Location: [facility];  Service: Open Heart Surgery;  Laterality: N/A;  . TONSILLECTOMY  1962        Home Medications    Prior to Admission medications   Medication Sig Start Date End Date Taking? Authorizing Provider  acetaminophen (TYLENOL) 500 MG tablet Take 1,000 mg by mouth 2 (two) times daily as needed for moderate pain.   Yes [provider]  albuterol (PROVENTIL) (2.5 MG/3ML) 0.083% nebulizer solution USE ONE VIAL IN  NEBULIZER EVERY 6 HOURS AS NEEDED FOR WHEEZING FOR SHORTNESS OF BREATH Patient taking differently: Take 2.5 mg by nebulization every 6 (six) hours as needed for shortness of breath.  08/04/18  Yes [name], NP  amLODipine (NORVASC) 10 MG tablet Take 1 tablet (10 mg total) by mouth daily. 06/23/18  Yes [name], NP  aspirin EC 81 MG tablet Take 1 tablet (81 mg total) by mouth daily. 01/24/16  Yes Burtis Junes, NP  atorvastatin (LIPITOR) 40 MG tablet TAKE 1 TABLET BY MOUTH ONCE DAILY IN THE EVENING Patient taking differently: Take 40 mg by mouth daily.  09/10/18  Yes Burtis Junes, NP  carvedilol (COREG) 25 MG tablet Take 25 mg by mouth 2 (two) times daily with a meal.   Yes [provider]  Cholecalciferol ([employer] VITAMIN D3) 1000 UNITS capsule Take 2 capsules (2,000 Units total) by mouth daily. 10/07/13  Yes Blanchie Serve, MD  Cinnamon 500  MG capsule Take 1,000 mg by mouth 2 (two) times daily.    Yes [provider]  Coenzyme Q10 (CO Q 10 PO) Take 200 mg by mouth daily.    Yes [provider]  epoetin alfa-epbx (RETACRIT) 41638 UNIT/ML injection 10,000 Units every 14 (fourteen) days.   Yes Edrick Oh, MD  fluticasone [facility]) 50 MCG/ACT nasal spray Place 2 sprays into both nostrils daily as needed for allergies or rhinitis (SEASONAL ALLERGIES).   Yes [provider]  Fluticasone-Salmeterol (ADVAIR DISKUS) 100-50 MCG/DOSE AEPB Inhale 1 puff into the lungs 2 (two) times daily. Patient taking differently: Inhale 1 puff into the lungs 2 (two) times daily as needed (SOB).  09/30/17  Yes Eulas Post, Monica, DO  furosemide (LASIX) 40 MG tablet TAKE 1 & 1/2 (ONE & ONE-HALF) TABLETS BY MOUTH TWICE DAILY Patient taking differently: Take 60 mg by mouth daily.  10/02/18  Yes Jerline Pain, MD  glucose blood (ONE TOUCH ULTRA TEST) test strip Use to test blood sugar three times daily. Dx: E11.21 12/04/17  Yes Gildardo Cranker, DO  hydrALAZINE (APRESOLINE) 100  MG tablet Take 100 mg by mouth 2 (two) times daily. 06/21/18  Yes [provider]  insulin detemir (LEVEMIR) 100 UNIT/ML injection Inject 30 Units into the skin daily as needed (sugar levels).    Yes [provider]  isosorbide mononitrate (IMDUR) 30 MG 24 hr tablet Take 1 tablet (30 mg total) by mouth daily. 04/02/18 10/24/18 Yes Burtis Junes, NP  montelukast (SINGULAIR) 10 MG tablet Take 1 tablet (10 mg total) by mouth at bedtime. 06/23/18  Yes [name], NP  Multiple Vitamins-Minerals (ONE-A-DAY MENS 50+ ADVANTAGE) TABS Take 1 tablet by mouth daily.   Yes [provider]  nitroGLYCERIN (NITROSTAT) 0.4 MG SL tablet Place 1 tablet (0.4 mg total) under the tongue every 5 (five) minutes as needed for chest pain. 05/22/16 11/26/48 Yes Gerhardt, Marlane Hatcher, NP  PROAIR HFA 108 (90 Base) MCG/ACT inhaler INHALE 2 PUFFS INTO LUNGS EVERY 6 HOURS AS NEEDED FOR SHORTNESS OF BREATH Patient taking differently: Inhale 2 puffs into the lungs every 6 (six) hours as needed for shortness of breath.  10/15/18  Yes [name], NP    Family History Family History  Problem Relation Age of Onset  . Diabetes Mother   . Heart disease Mother        later in life, age >9  . Heart disease Father        heart failure later in life  . Hypertension Father   . Stroke Sister   . Kidney disease Daughter   . Sudden death Daughter   . Colon cancer Paternal Uncle        Passed from colon CA  . Heart attack Neg Hx     Social History Social History   Tobacco Use  . Smoking status: Former Smoker    Years: 17.00    Types: Cigars    Quit date: 12/31/2014    Years since quitting: 3.8  . Smokeless tobacco: Never Used  Substance Use Topics  . Alcohol use: Yes    Alcohol/week: 0.0 standard drinks    Comment: occasional glass of wine.  . Drug use: No     Allergies   Patient has no known allergies.   Review of Systems Review of Systems  Constitutional: Negative for fever.   Respiratory: Positive for shortness of breath. Negative for cough.   Cardiovascular: Positive for leg swelling. Negative for chest  pain.  Gastrointestinal: Negative for abdominal pain, nausea and vomiting.  Genitourinary: Negative for difficulty urinating and dysuria.  All other systems reviewed and are negative.    Physical Exam Updated Vital Signs BP (!) 185/98   Pulse 87   Temp 98.7 F (37.1 C) (Oral)   Resp 14   SpO2 98%   Physical Exam Vitals signs and nursing note reviewed.  Constitutional:      Appearance: He is well-developed. He is not ill-appearing.  HENT:     Head: Normocephalic and atraumatic.  Eyes:     Pupils: Pupils are equal, round, and reactive to light.  Neck:     Musculoskeletal: Neck supple.     Vascular: JVD present.  Cardiovascular:     Rate and Rhythm: Normal rate and regular rhythm.     Heart sounds: Normal heart sounds. No murmur.  Pulmonary:     Effort: Pulmonary effort is normal. No respiratory distress.     Breath sounds: Wheezing present.     Comments: No respiratory distress, wheezing in all lung fields Abdominal:     General: Bowel sounds are normal.     Palpations: Abdomen is soft.     Tenderness: There is no abdominal tenderness. There is no rebound.  Musculoskeletal:     Right lower leg: He exhibits no tenderness. Edema present.     Left lower leg: He exhibits no tenderness. Edema present.     Comments: 1+ pitting edema bilateral lower extremities to the knees  Lymphadenopathy:     Cervical: No cervical adenopathy.  Skin:    General: Skin is warm and dry.  Neurological:     Mental Status: He is alert and oriented to person, place, and time.  Psychiatric:        Mood and Affect: Mood normal.      ED Treatments / Results  Labs (all labs ordered are listed, but only abnormal results are displayed) Labs Reviewed  BASIC METABOLIC PANEL - Abnormal; Notable for the following components:      Result Value   CO2 21 (*)     Glucose, Bld 186 (*)    BUN 88 (*)    Creatinine, Ser 6.08 (*)    GFR calc non Af Amer 9 (*)    GFR calc Af Amer 10 (*)    All other components within normal limits  CBC - Abnormal; Notable for the following components:   RBC 2.93 (*)    Hemoglobin 9.2 (*)    HCT 29.3 (*)    All other components within normal limits  BRAIN NATRIURETIC PEPTIDE    EKG EKG Interpretation  Date/Time:  Friday October 24 2018 00:38:12 EDT Ventricular Rate:  93 PR Interval:    QRS Duration: 118 QT Interval:  389 QTC Calculation: 484 R Axis:   19 Text Interpretation:  Sinus rhythm LVH with secondary repolarization abnormality Borderline prolonged QT interval Confirmed by Thayer Jew [phone]) on 10/24/2018 1:07:53 AM   Radiology Dg Chest 2 View  Result Date: 10/23/2018 CLINICAL DATA:  Shortness of breath. EXAM: CHEST - 2 VIEW COMPARISON:  August 29, 2018 FINDINGS: Postsurgical changes of CABG. Mildly enlarged cardiac silhouette. Mild interstitial pulmonary edema. Bilateral lower lung fields peribronchial airspace consolidation cannot be excluded. Osseous structures are without acute abnormality. Soft tissues are grossly normal. IMPRESSION: Mild interstitial pulmonary edema. Bilateral lower lung fields peribronchial airspace consolidation cannot be excluded. Electronically Signed   By: Fidela Salisbury M.D.   On: 10/23/2018 16:43  Procedures Procedures (including critical care time)  CRITICAL CARE Performed by: Merryl Hacker   Total critical care time: 31 minutes  Critical care time was exclusive of separately billable procedures and treating other patients.  Critical care was necessary to treat or prevent imminent or life-threatening deterioration.  Critical care was time spent personally by me on the following activities: development of treatment plan with patient and/or surrogate as well as nursing, discussions with consultants, evaluation of patient's response to treatment,  examination of patient, obtaining history from patient or surrogate, ordering and performing treatments and interventions, ordering and review of laboratory studies, ordering and review of radiographic studies, pulse oximetry and re-evaluation of patient's condition.   Medications Ordered in ED Medications  sodium chloride flush (NS) 0.9 % injection 3 mL (3 mLs Intravenous Given 10/24/18 0318)  furosemide (LASIX) 100 mg in dextrose 5 % 50 mL IVPB (100 mg Intravenous New Bag/Given 10/24/18 0317)     Initial Impression / Assessment and Plan / ED Course  I have reviewed the triage vital signs and the nursing notes.  Pertinent labs & imaging results that were available during my care of the patient were reviewed by me and considered in my medical decision making (see chart for details).        Patient presents with shortness of breath and orthopnea.  He is overall nontoxic-appearing.  Vital signs notable for blood pressure of 185/98.  He does appear somewhat volume overloaded on exam with lower extremity edema and wheezing.  He is in no respiratory distress.  Chest x-ray shows mild pulmonary edema.  Creatinine is at baseline of 6.  EKG shows no evidence of ischemia.  He is not having any active or ongoing chest pain.  Highly suspicious he is having an acute heart failure exacerbation.  Given his renal function and daily Lasix dose, patient was given 100 mg of IV Lasix.  On recheck, he is able to ambulate and maintain pulse ox greater than 95%.  He did get slightly tachypneic.  However, he states he feels somewhat better after the Lasix dose and has had good urine output.  We discussed continuing his Lasix at home and following up closely with cardiology as an outpatient to avoid admission given that he seems to respond well to the Lasix.  Patient is agreeable to plan and states he feels better.  After history, exam, and medical workup I feel the patient has been appropriately medically screened and is  safe for discharge home. Pertinent diagnoses were discussed with the patient. Patient was given return precautions.   Final Clinical Impressions(s) / ED Diagnoses   Final diagnoses:  Acute on chronic systolic heart failure ([place])  Shortness of breath    ED Discharge Orders    None       Merryl Hacker, MD 10/24/18 [phone]

## 2018-10-24 NOTE — Discharge Instructions (Addendum)
You were seen today for shortness of breath.  This is likely an acute exacerbation of your heart failure.  You were given a large dose of Lasix.  Continue your Lasix at home daily.  Call your cardiologist for close follow-up and medication adjustment.  If you develop new or worsening symptoms you should be reevaluated.

## 2018-10-24 NOTE — ED Notes (Signed)
Discharge instructions discussed with pt. Pt verbalized understanding. Pt stable and ambulatory. No signature pad available. 

## 2018-10-24 NOTE — ED Notes (Signed)
Pt ambulated well independently. Pt maintained O2 sat above 95% but did become tachypnec at 34 RR per minute.

## 2018-10-27 NOTE — Progress Notes (Signed)
CARDIOLOGY OFFICE NOTE  Date:  10/28/2018    [name] Date of Birth: [birth year] Medical Record #[id number]  PCP:  [name], NP  Cardiologist:  [name]   Chief Complaint  Patient presents with  . Follow-up    Post ER visit    History of Present Illness: Henry Carroll is a 68 y.o. male who presents today for a post ER visit. Seen for [name].   He has a history of CAD with prior CABG in 2017 - LIMA to LAD, free right internal mammary graft to ramus, SVG to OM, SVG to PDA. Other issues include diabetes, hypertension, hyperlipidemia, stage III CKD (Dr. 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 Prior nuclear stress test was intermediate risk showing a medium-sized severe basal to mid inferior inferolateral perfusion defect. EF on nuclear stress was 30%. Echo from [facility] 2018showed improvement with EF of 45 to 50% with chronic systolic HF. He sees [name] for OSA.   He had issues with anemia in November of 2018 - this required transfusion. He was sent back to GI- then to hematology - placed on iron infusions.Has had issues with volume overload in the past. Has had progressive CKD.   He is on Entresto.   Last seen by me in February of 2020 and had cough/congestion and got better with antibiotics. Had a telehealth visit with [name] back in May.   In the ER last week with volume overload - treated with IV Lasix and asked to follow up here. BNP grossly elevated.   The patient does not have symptoms concerning for COVID-19 infection (fever, chills, cough, or new shortness of breath).   Comes in today. Here alone. He notes he was doing well (by his standards) up until last week - had abrupt onset of DOE, up to 4 pillow orthopnea - endorsed much more salt use - has bacon every day - lots of take out - loves [employer].  He has had  worsening kidney function - now with a creatinine over 6. He saw Dr. Posey Pronto earlier today - has been placed on Zaroloxyn twice a week along with a higher dose of Lasix. He is "prepping for dialysis" and a transplant per his report. To see [facility] for graft placement.  He is having a repeat echo on Monday next week. He is not able to sleep - can't lie down. His wife gave him some Tramadol to help him rest - he did not know that he really should not take this.  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 Has had bone marrow biopsy - PET without evidence of myelona or plasmacytoma - now needing kidney biopsy to rule out myeloma kidney. Fortunately, no chest pain.    Past Medical History:  Diagnosis Date  . Abnormal stress test 02/10/2015  . Acute on chronic systolic and diastolic heart failure, NYHA class 1 ([place]) 12/31/2014  . Anemia   . Anemia in chronic kidney disease 09/03/2012  . 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 Asthma   . Asthma, chronic 06/04/2012  . Asthma, chronic, mild intermittent, with acute exacerbation 12/22/2015  . Bilateral lower extremity edema 12/22/2015  . CAD (coronary artery disease) 02/18/2015  . Chronic combined systolic (congestive) and diastolic (congestive) heart failure ([place])    a. 12/31/14: 2D ECHO: EF 40-45%, HK of inf myocardium, G1DD, mod MR  .  Chronic combined systolic and diastolic CHF (congestive heart failure) ([place]) 02/10/2015  . Chronic systolic heart failure ([place]) 02/25/2015  . CKD (chronic kidney disease), stage III (HCC)    stage 3 kidney disease  . CKD (chronic kidney disease), stage V ([place]) 08/30/2018  . COLONIC POLYPS, HX OF 04/13/2009   Qualifier: Diagnosis of  By: West[place] Pam.   . Coronary artery disease    a. LHC 01/2015 - triple vessel CAD (mod oLM, mLAD, severe mRCA, intermediate branch stenosis, CTO of mCx). Plan CABG 02/2015.  Marland Kitchen Demand ischemia ([place]) 12/31/2014  . DM type 2, uncontrolled, with renal complications ([place]) 1/61/0960  . Elevated troponin   .  Essential hypertension 10/09/2013  . Folliculitis of perineum 10/01/2012  . Gastric erosion   . GERD (gastroesophageal reflux disease)   . Hyperlipidemia   . Hyperlipidemia with target LDL less than 100 12/31/2014  . Hypertension   . Hypertensive heart disease with heart failure ([place]) 02/25/2015  . IDA (iron deficiency anemia) 01/23/2016  . Melena 01/23/2016  . 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 Mitral regurgitation    a. Mild-mod by echo 12/2014.  . Morbid obesity ([place]) 12/29/2014  . Myocardial infarction [facility])    pt. states per [name] he has in the past  . NSVT (nonsustained ventricular tachycardia) ([place])    a. 9 beats during 01/2015 adm. BB titrated.  . Obesity    a. BMI 33  . Obesity (BMI 30-39.9) 05/05/2013  . OSA (obstructive sleep apnea) 01/16/2018   Mild obstructive sleep apnea overall with an AHI of 7.3/h and no significant central sleep apnea. Severe obstructive sleep apnea during REM sleep with an AHI of 34.3/h.  Now on CPAP at 6 cm H2O.   . Other malaise and fatigue 09/03/2012  . 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 PAD (peripheral artery disease) ([place]) 03/02/2015  . Sleep apnea    2019, Dr.Turner diagnoised   . Small bowel lesion   . Symptomatic anemia 08/29/2018  . 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 Type II diabetes mellitus ([place])     Past Surgical History:  Procedure Laterality Date  . BACK SURGERY    . CARDIAC CATHETERIZATION N/A 02/09/2015   Procedure: Left Heart Cath and Coronary Angiography;  Surgeon: Burnell Blanks, MD;  Location: [place] CV LAB;  Service: Cardiovascular;  Laterality: N/A;  . COLONOSCOPY  2011   Dr. [name]: Ileocecal valve appeared normal, scattered pancolonic diverticulosis, difficult bowel prep making smaller lesions potentially missed. Recommended three-year follow-up colonoscopy.  . COLONOSCOPY  2003   Dr. [name]: Suspicious lesion at the ileocecal valve, multiple biopsies benign,  pancolonic diverticulosis.  . COLONOSCOPY N/A 02/15/2016   diverticulosis in sigmoid and descending colon, single 5 mm polyp at splenic flexure (Tubular adenoma)  . CORONARY ARTERY BYPASS GRAFT N/A 02/18/2015   Procedure: CORONARY ARTERY BYPASS GRAFTING (CABG) x  four, using bilateral internal mammary arteries and right leg greater saphenous vein harvested endoscopically;  Surgeon: [name], MD;  Location: [place] OR;  Service: Open Heart Surgery;  Laterality: N/A;  . ESOPHAGOGASTRODUODENOSCOPY N/A 02/15/2016   normal  . EYE SURGERY Right    July 2019   . GIVENS CAPSULE STUDY N/A 01/08/2017   couple of gastric and small bowel eroions in setting of aspirin 81 mg daily but nothing concerning, continue Hematology follow-up  . HERNIA REPAIR  4540   Umbilical  . LUMBAR [place] SURGERY  2006   L4 & L5  . REFRACTIVE  SURGERY Left    2019   . REFRACTIVE SURGERY Right    2019  . TEE WITHOUT CARDIOVERSION N/A 02/18/2015   Procedure: TRANSESOPHAGEAL ECHOCARDIOGRAM (TEE);  Surgeon: [name], MD;  Location: [place];  Service: Open Heart Surgery;  Laterality: N/A;  . TONSILLECTOMY  1962     Medications: Current Meds  Medication Sig  . acetaminophen (TYLENOL) 500 MG tablet Take 1,000 mg by mouth 2 (two) times daily as needed for moderate pain.  Marland Kitchen albuterol (PROVENTIL) (2.5 MG/3ML) 0.083% nebulizer solution USE ONE VIAL IN NEBULIZER EVERY 6 HOURS AS NEEDED FOR WHEEZING FOR SHORTNESS OF BREATH (Patient taking differently: Take 2.5 mg by nebulization every 6 (six) hours as needed for shortness of breath. )  . amLODipine (NORVASC) 10 MG tablet Take 1 tablet (10 mg total) by mouth daily.  Marland Kitchen aspirin EC 81 MG tablet Take 1 tablet (81 mg total) by mouth daily.  Marland Kitchen atorvastatin (LIPITOR) 40 MG tablet TAKE 1 TABLET BY MOUTH ONCE DAILY IN THE EVENING (Patient taking differently: Take 40 mg by mouth daily. )  . carvedilol (COREG) 25 MG tablet Take 25 mg by mouth 2 (two) times daily with a meal.  . Cholecalciferol  ([employer] VITAMIN D3) 1000 UNITS capsule Take 2 capsules (2,000 Units total) by mouth daily.  . Cinnamon 500 MG capsule Take 1,000 mg by mouth 2 (two) times daily.   . Coenzyme Q10 (CO Q 10 PO) Take 200 mg by mouth daily.   Marland Kitchen epoetin alfa-epbx (RETACRIT) 97026 UNIT/ML injection 10,000 Units every 14 (fourteen) days.  . fluticasone (FLONASE) 50 MCG/ACT nasal spray Place 2 sprays into both nostrils daily as needed for allergies or rhinitis (SEASONAL ALLERGIES).  . Fluticasone-Salmeterol (ADVAIR DISKUS) 100-50 MCG/DOSE AEPB Inhale 1 puff into the lungs 2 (two) times daily. (Patient taking differently: Inhale 1 puff into the lungs 2 (two) times daily as needed (SOB). )  . furosemide (LASIX) 40 MG tablet TAKE 1 & 1/2 (ONE & ONE-HALF) TABLETS BY MOUTH TWICE DAILY (Patient taking differently: Take 60 mg by mouth daily. )  . furosemide (LASIX) 40 MG tablet Take 80 mg by mouth 2 (two) times daily.  Marland Kitchen glucose blood (ONE TOUCH ULTRA TEST) test strip Use to test blood sugar three times daily. Dx: E11.21  . hydrALAZINE (APRESOLINE) 100 MG tablet Take 100 mg by mouth 2 (two) times daily.  . insulin detemir (LEVEMIR) 100 UNIT/ML injection Inject 30 Units into the skin daily as needed (sugar levels).   . isosorbide mononitrate (IMDUR) 30 MG 24 hr tablet Take 1 tablet (30 mg total) by mouth daily.  . metolazone (ZAROXOLYN) 5 MG tablet Take 5 mg by mouth as directed. Tuesday and Saturday only  . montelukast (SINGULAIR) 10 MG tablet Take 1 tablet (10 mg total) by mouth at bedtime.  . Multiple Vitamins-Minerals (ONE-A-DAY MENS 50+ ADVANTAGE) TABS Take 1 tablet by mouth daily.  . nitroGLYCERIN (NITROSTAT) 0.4 MG SL tablet Place 1 tablet (0.4 mg total) under the tongue every 5 (five) minutes as needed for chest pain.  Marland Kitchen PROAIR HFA 108 (90 Base) MCG/ACT inhaler INHALE 2 PUFFS INTO LUNGS EVERY 6 HOURS AS NEEDED FOR SHORTNESS OF BREATH (Patient taking differently: Inhale 2 puffs into the lungs every 6 (six) hours as needed for  shortness of breath. )     Allergies: No Known Allergies  Social History: The patient  reports that he quit smoking about 3 years ago. His smoking use included cigars. He quit after 17.00 years  of use. He has never used smokeless tobacco. He reports current alcohol use. He reports that he does not use drugs.   Family History: The patient's family history includes Colon cancer in his paternal uncle; Diabetes in his mother; Heart disease in his father and mother; Hypertension in his father; Kidney disease in his daughter; Stroke in his sister; Sudden death in his daughter.   Review of Systems: Please see the history of present illness.   All other systems are reviewed and negative.   Physical Exam: VS:  BP (!) 150/70 (BP Location: Left Arm, Patient Position: Sitting, Cuff Size: Normal)   Pulse 72   Ht '6\' 2"'$  (1.88 m)   Wt 217 lb 12.8 oz (98.8 kg)   SpO2 95% Comment: at rest  BMI 27.96 kg/m  .  BMI Body mass index is 27.96 kg/m.  Wt Readings from Last 3 Encounters:  10/28/18 217 lb 12.8 oz (98.8 kg)  10/14/18 219 lb (99.3 kg)  10/08/18 213 lb 6.5 oz (96.8 kg)    General: Pleasant. Alert and in no acute distress.  He looks fatigued. Lots of edema noted.  HEENT: Normal.  Neck: Supple, no JVD, carotid bruits, or masses noted.  Cardiac: Regular rate and rhythm. No murmurs, rubs, or gallops. 2+ edema.  Respiratory:  Lungs are fairly clear to auscultation bilaterally with normal work of breathing.  GI: Soft and nontender.  MS: No deformity or atrophy. Gait and ROM intact.  Skin: Warm and dry. Color is normal.  Neuro:  Strength and sensation are intact and no gross focal deficits noted.  Psych: Alert, appropriate and with normal affect.   LABORATORY DATA:  EKG:  EKG is not ordered today.  Lab Results  Component Value Date   WBC 5.4 10/23/2018   HGB 9.2 (L) 10/23/2018   HCT 29.3 (L) 10/23/2018   PLT 212 10/23/2018   GLUCOSE 186 (H) 10/23/2018   CHOL 151 08/28/2018   TRIG  104 08/28/2018   HDL 51 08/28/2018   LDLCALC 80 08/28/2018   ALT 15 10/14/2018   AST 17 10/14/2018   NA 137 10/23/2018   K 4.4 10/23/2018   CL 105 10/23/2018   CREATININE 6.08 (H) 10/23/2018   BUN 88 (H) 10/23/2018   CO2 21 (L) 10/23/2018   TSH 0.94 12/19/2015   PSA 0.8 12/19/2015   INR 1.0 09/29/2018   HGBA1C 7.1 (H) 08/28/2018   MICROALBUR 271.8 01/10/2017     BNP (last 3 results) Recent Labs    10/23/18 1617  BNP 2,625.6*    ProBNP (last 3 results) Recent Labs    04/01/18 1019  PROBNP 2,186*     Other Studies Reviewed Today:  ECHO IMPRESSIONS 03/2018   1. The left ventricle has low normal systolic function, with an ejection fraction of 50-55%. The cavity size was normal. There is moderately increased left ventricular wall thickness. Left ventricular diastolic Doppler parameters are consistent with  impaired relaxation.  2. The right ventricle has normal systolic function. The cavity was normal. There is no increase in right ventricular wall thickness.  3. Left atrial size was mildly dilated.  4. Right atrial size was mildly dilated.  5. The mitral valve is normal in structure. Moderate thickening of the mitral valve leaflet. No evidence of mitral valve stenosis, with a calculated valve area of 2.08 cm.  6. The tricuspid valve is normal in structure.  7. The aortic valve is tricuspid Moderate thickening of the aortic valve Aortic valve regurgitation is mild  by color flow Doppler. no stenosis of the aortic valve.  8. The aortic root is normal in size and structure.  9. Pulmonary hypertension is indeterminant, inadequate TR jet.   Echocardiogram 11/15/2016: - Left ventricle: The cavity size was mildly dilated. Wall thickness was normal. Systolic function was mildly to moderately reduced. The estimated ejection fraction was in the range of 40% to 45%. There is hypokinesis of the inferolateral and inferior myocardium. Doppler parameters are consistent  with abnormal left ventricular relaxation (grade 1 diastolic dysfunction). - Ascending aorta: The ascending aorta was mildly dilated. - Mitral valve: Calcified annulus. There was mild regurgitation. - Left atrium: The atrium was mildly dilated. - Right atrium: The atrium was mildly dilated. - Pulmonary arteries: Systolic pressure was mildly increased. PA peak pressure: 44 mm Hg (S).  Impressions:  - Hypokinesis of the inferior and inferolateral walls with overall mild to moderate LV dysfunction; mild diastolic dysfunction; mildly dilated ascending aorta; mild MR; mild biatrial enlargement; mild TR with mildly elevated pulmonary pressure.  MyoviewStudy Highlights9/2018    The left ventricular ejection fraction is moderately decreased (30-44%).  Nuclear stress EF: 40%.  There was no ST segment deviation noted during stress.  Findings consistent with prior myocardial infarction with peri-infarct ischemia.  This is an intermediate risk study.  1. EF 40% with inferolateral hypokinesis.  2. Primarily fixed medium-sized, moderate intensity basal to mid inferolateral and basal inferior perfusion defect. This is suggestive of prior infarction with mild peri-infarct ischemia.   Intermediate risk study primarily due to low EF.    Assessment/Plan:  1. Acute on chronic systolic HF - recent ER visit - most likely due to progressive CKD - now looking at starting dialysis. Possible transplant per his report. For echo next week. Zaroxolyn started along with increase in his Lasix by nephrology. Encouraged him to try and restrict his salt more. Avoid NSAID use. Overall prognosis looks quite tenuous to me.   2. Ischemic CM - for repeat echo next week. See #1.   3. CAD - prior CABG - fortunately, no chest pain. Will most likely need stress testing/cardiac evaluation if deemed transplant eligible.   4. HTN - BP not at goal - additional diuretics have been ordered  earlier today.   5. Worsening CKD - see above. Off Entresto. Looks to be nearing dialysis.   6. Chronic anemia - plan per hematology - looks like he may be needing kidney biopsy at some point to rule out myeloma.   7. COVID-19 Education: The signs and symptoms of COVID-19 were discussed with the patient and how to seek care for testing (follow up with PCP or arrange E-visit).  The importance of social distancing, staying at home, hand hygiene and wearing a mask when out in public were discussed today.  Current medicines are reviewed with the patient today.  The patient does not have concerns regarding medicines other than what has been noted above.  The following changes have been made:  See above.  Labs/ tests ordered today include:   No orders of the defined types were placed in this encounter.    Disposition:   FU with [place] in about 2 to 3 months. Will be available as needed.   Patient is agreeable to this plan and will call if any problems develop in the interim.   SignedTruitt Merle, NP  10/28/2018 11:52 AM  [facility] [address] [place], [place]  [address] Phone: [phone] Fax: [phone])  [phone]        

## 2018-10-28 ENCOUNTER — Encounter: Payer: Self-pay | Admitting: Nurse Practitioner

## 2018-10-28 ENCOUNTER — Other Ambulatory Visit: Payer: Self-pay

## 2018-10-28 ENCOUNTER — Ambulatory Visit (INDEPENDENT_AMBULATORY_CARE_PROVIDER_SITE_OTHER): Payer: Medicare Other | Admitting: Nurse Practitioner

## 2018-10-28 VITALS — BP 150/70 | HR 72 | Ht 74.0 in | Wt 217.8 lb

## 2018-10-28 DIAGNOSIS — D631 Anemia in chronic kidney disease: Secondary | ICD-10-CM

## 2018-10-28 DIAGNOSIS — I255 Ischemic cardiomyopathy: Secondary | ICD-10-CM | POA: Diagnosis not present

## 2018-10-28 DIAGNOSIS — N184 Chronic kidney disease, stage 4 (severe): Secondary | ICD-10-CM | POA: Diagnosis not present

## 2018-10-28 DIAGNOSIS — I5042 Chronic combined systolic (congestive) and diastolic (congestive) heart failure: Secondary | ICD-10-CM | POA: Diagnosis not present

## 2018-10-28 DIAGNOSIS — Z951 Presence of aortocoronary bypass graft: Secondary | ICD-10-CM

## 2018-10-28 DIAGNOSIS — N189 Chronic kidney disease, unspecified: Secondary | ICD-10-CM | POA: Diagnosis not present

## 2018-10-28 DIAGNOSIS — N185 Chronic kidney disease, stage 5: Secondary | ICD-10-CM | POA: Diagnosis not present

## 2018-10-28 DIAGNOSIS — I5021 Acute systolic (congestive) heart failure: Secondary | ICD-10-CM

## 2018-10-28 DIAGNOSIS — N2581 Secondary hyperparathyroidism of renal origin: Secondary | ICD-10-CM | POA: Diagnosis not present

## 2018-10-28 DIAGNOSIS — D649 Anemia, unspecified: Secondary | ICD-10-CM | POA: Diagnosis not present

## 2018-10-28 DIAGNOSIS — I120 Hypertensive chronic kidney disease with stage 5 chronic kidney disease or end stage renal disease: Secondary | ICD-10-CM | POA: Diagnosis not present

## 2018-10-28 DIAGNOSIS — I10 Essential (primary) hypertension: Secondary | ICD-10-CM | POA: Diagnosis not present

## 2018-10-28 DIAGNOSIS — Z23 Encounter for immunization: Secondary | ICD-10-CM | POA: Diagnosis not present

## 2018-10-28 DIAGNOSIS — Z7189 Other specified counseling: Secondary | ICD-10-CM

## 2018-10-28 DIAGNOSIS — Z1159 Encounter for screening for other viral diseases: Secondary | ICD-10-CM | POA: Diagnosis not present

## 2018-10-28 NOTE — Patient Instructions (Signed)
After Visit Summary:  We will be checking the following labs today - NONE   Medication Instructions:    Continue with your current medicines.   Take the Zaroxolyn about 30 minutes prior to your dose of lasix    If you need a refill on your cardiac medications before your next appointment, please call your pharmacy.     Testing/Procedures To Be Arranged:  Echo next week as planned   Follow-Up:   See [name] in about 2 to 3 months    At [facility], you and your health needs are our priority.  As part of our continuing mission to provide you with exceptional heart care, we have created designated Provider Care Teams.  These Care Teams include your primary Cardiologist (physician) and Advanced Practice Providers (APPs -  Physician Assistants and Nurse Practitioners) who all work together to provide you with the care you need, when you need it.  Special Instructions:  . Stay safe, stay home, wash your hands for at least 20 seconds and wear a mask when out in public.  . It was good to talk with you today.  . Really try to limit the salt as best you can.    Call the [place] office at [phone] if you have any questions, problems or concerns.

## 2018-10-31 ENCOUNTER — Other Ambulatory Visit: Payer: Self-pay

## 2018-10-31 DIAGNOSIS — N185 Chronic kidney disease, stage 5: Secondary | ICD-10-CM

## 2018-11-03 ENCOUNTER — Other Ambulatory Visit: Payer: Medicare Other | Admitting: Cardiology

## 2018-11-03 ENCOUNTER — Ambulatory Visit (HOSPITAL_COMMUNITY)
Admission: RE | Admit: 2018-11-03 | Discharge: 2018-11-03 | Disposition: A | Discharge status: Home / Self Care | Payer: Medicare Other | Source: Ambulatory Visit | Attending: Surgery | Admitting: Surgery

## 2018-11-03 ENCOUNTER — Ambulatory Visit (INDEPENDENT_AMBULATORY_CARE_PROVIDER_SITE_OTHER)
Admission: RE | Admit: 2018-11-03 | Discharge: 2018-11-03 | Disposition: A | Discharge status: Home / Self Care | Payer: Medicare Other | Source: Ambulatory Visit | Attending: Surgery | Admitting: Surgery

## 2018-11-03 ENCOUNTER — Ambulatory Visit (INDEPENDENT_AMBULATORY_CARE_PROVIDER_SITE_OTHER): Payer: Medicare Other | Admitting: Surgery

## 2018-11-03 ENCOUNTER — Encounter: Payer: Self-pay | Admitting: *Deleted

## 2018-11-03 ENCOUNTER — Other Ambulatory Visit: Payer: Self-pay | Admitting: *Deleted

## 2018-11-03 ENCOUNTER — Encounter: Payer: Self-pay | Admitting: Surgery

## 2018-11-03 ENCOUNTER — Ambulatory Visit (HOSPITAL_BASED_OUTPATIENT_CLINIC_OR_DEPARTMENT_OTHER): Payer: Medicare Other

## 2018-11-03 ENCOUNTER — Other Ambulatory Visit: Payer: Self-pay

## 2018-11-03 VITALS — BP 138/67 | HR 65 | Temp 97.6°F | Resp 20 | Ht 74.0 in | Wt 211.9 lb

## 2018-11-03 DIAGNOSIS — I255 Ischemic cardiomyopathy: Secondary | ICD-10-CM | POA: Diagnosis not present

## 2018-11-03 DIAGNOSIS — Z01810 Encounter for preprocedural cardiovascular examination: Secondary | ICD-10-CM

## 2018-11-03 DIAGNOSIS — N185 Chronic kidney disease, stage 5: Secondary | ICD-10-CM | POA: Diagnosis not present

## 2018-11-03 DIAGNOSIS — I5042 Chronic combined systolic (congestive) and diastolic (congestive) heart failure: Secondary | ICD-10-CM

## 2018-11-03 NOTE — H&P (View-Only) (Signed)
[facility]  Patient name: Henry Carroll MRN: [id number] DOB: [birth year] Sex: male   REQUESTING PROVIDER:    Dr. Posey Pronto   REASON FOR CONSULT:    Dialysis access  HISTORY OF PRESENT ILLNESS:   Henry Carroll is a 68 y.o. male, who is referred today for dialysis access.  Stage V renal disease.  He is right-handed.  He has never had a pacemaker.  There is renal failure secondary to hypertension and diabetes.  He also has a history of congestive heart failure and coronary artery disease status post CABG.  He takes a statin for hypercholesterolemia.  PAST MEDICAL HISTORY    Past Medical History:  Diagnosis Date  . Abnormal stress test 02/10/2015  . Acute on chronic systolic and diastolic heart failure, NYHA class 1 ([place]) 12/31/2014  . Anemia   . Anemia in chronic kidney disease 09/03/2012  . 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 Asthma   . Asthma, chronic 06/04/2012  . Asthma, chronic, mild intermittent, with acute exacerbation 12/22/2015  . Bilateral lower extremity edema 12/22/2015  . CAD (coronary artery disease) 02/18/2015  . Chronic combined systolic (congestive) and diastolic (congestive) heart failure ([place])    a. 12/31/14: 2D ECHO: EF 40-45%, HK of inf myocardium, G1DD, mod MR  . Chronic combined systolic and diastolic CHF (congestive heart failure) ([place]) 02/10/2015  . Chronic systolic heart failure ([place]) 02/25/2015  . CKD (chronic kidney disease), stage III ([facility])    stage 3 kidney disease  . CKD (chronic kidney disease), stage V ([place]) 08/30/2018  . COLONIC POLYPS, HX OF 04/13/2009   Qualifier: Diagnosis of  By: Westly Pam.   . Coronary artery disease    a. LHC 01/2015 - triple vessel CAD (mod oLM, mLAD, severe mRCA, intermediate branch stenosis, CTO of mCx). Plan CABG 02/2015.  Marland Kitchen Demand ischemia ([place]) 12/31/2014  . DM type 2, uncontrolled, with renal complications ([place]) 3/54/6568  . Elevated  troponin   . Essential hypertension 10/09/2013  . Folliculitis of perineum 10/01/2012  . Gastric erosion   . GERD (gastroesophageal reflux disease)   . Hyperlipidemia   . Hyperlipidemia with target LDL less than 100 12/31/2014  . Hypertension   . Hypertensive heart disease with heart failure ([place]) 02/25/2015  . IDA (iron deficiency anemia) 01/23/2016  . Melena 01/23/2016  . 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 Mitral regurgitation    a. Mild-mod by echo 12/2014.  . Morbid obesity ([place]) 12/29/2014  . Myocardial infarction [facility])    pt. states per [name] he has in the past  . NSVT (nonsustained ventricular tachycardia) ([place])    a. 9 beats during 01/2015 adm. BB titrated.  . Obesity    a. BMI 33  . Obesity (BMI 30-39.9) 05/05/2013  . OSA (obstructive sleep apnea) 01/16/2018   Mild obstructive sleep apnea overall with an AHI of 7.3/h and no significant central sleep apnea. Severe obstructive sleep apnea during REM sleep with an AHI of 34.3/h.  Now on CPAP at 6 cm H2O.   . Other malaise and fatigue 09/03/2012  . 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 PAD (peripheral artery disease) ([place]) 03/02/2015  . Sleep apnea    2019, Dr.Turner diagnoised   . Small bowel lesion   . Symptomatic anemia 08/29/2018  . 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 Type II diabetes mellitus ([place])      FAMILY HISTORY   Family History  Problem Relation  Age of Onset  . Diabetes Mother   . Heart disease Mother        later in life, age >61  . Heart disease Father        heart failure later in life  . Hypertension Father   . Stroke Sister   . Kidney disease Daughter   . Sudden death Daughter   . Colon cancer Paternal Uncle        Passed from colon CA  . Heart attack Neg Hx     SOCIAL HISTORY:   Social History   Socioeconomic History  . Marital status: Married    Spouse name: Not on file  . Number of children: Not on file  . Years of education: Not on file  .  Highest education level: Not on file  Occupational History  . Not on file  Social Needs  . Financial resource strain: Not hard at all  . Food insecurity    Worry: Never true    Inability: Never true  . Transportation needs    Medical: No    Non-medical: No  Tobacco Use  . Smoking status: Former Smoker    Years: 17.00    Types: Cigars    Quit date: 12/31/2014    Years since quitting: 3.8  . Smokeless tobacco: Never Used  Substance and Sexual Activity  . Alcohol use: Yes    Alcohol/week: 0.0 standard drinks    Comment: occasional glass of wine.  . Drug use: No  . Sexual activity: Yes    Birth control/protection: None  Lifestyle  . Physical activity    Days per week: 5 days    Minutes per session: 30 min  . Stress: Only a little  Relationships  . Social connections    Talks on phone: More than three times a week    Gets together: More than three times a week    Attends religious service: More than 4 times per year    Active member of club or organization: Yes    Attends meetings of clubs or organizations: 1 to 4 times per year    Relationship status: Married  . Intimate partner violence    Fear of current or ex partner: No    Emotionally abused: No    Physically abused: No    Forced sexual activity: No  Other Topics Concern  . Not on file  Social History Narrative  . Not on file    ALLERGIES:    No Known Allergies  CURRENT MEDICATIONS:    Current Outpatient Medications  Medication Sig Dispense Refill  . acetaminophen (TYLENOL) 500 MG tablet Take 1,000 mg by mouth 2 (two) times daily as needed for moderate pain.    Marland Kitchen albuterol (PROVENTIL) (2.5 MG/3ML) 0.083% nebulizer solution USE ONE VIAL IN NEBULIZER EVERY 6 HOURS AS NEEDED FOR WHEEZING FOR SHORTNESS OF BREATH (Patient taking differently: Take 2.5 mg by nebulization every 6 (six) hours as needed for shortness of breath. ) 300 mL 3  . amLODipine (NORVASC) 10 MG tablet Take 1 tablet (10 mg total) by mouth  daily. 90 tablet 1  . aspirin EC 81 MG tablet Take 1 tablet (81 mg total) by mouth daily. 90 tablet 3  . atorvastatin (LIPITOR) 40 MG tablet TAKE 1 TABLET BY MOUTH ONCE DAILY IN THE EVENING (Patient taking differently: Take 40 mg by mouth daily. ) 90 tablet 2  . carvedilol (COREG) 25 MG tablet Take 25 mg by mouth 2 (two) times daily with  a meal.    . Cholecalciferol ([employer] VITAMIN D3) 1000 UNITS capsule Take 2 capsules (2,000 Units total) by mouth daily. 60 capsule 3  . Cinnamon 500 MG capsule Take 1,000 mg by mouth 2 (two) times daily.     . Coenzyme Q10 (CO Q 10 PO) Take 200 mg by mouth daily.     Marland Kitchen epoetin alfa-epbx (RETACRIT) 31517 UNIT/ML injection 10,000 Units every 14 (fourteen) days.    . fluticasone (FLONASE) 50 MCG/ACT nasal spray Place 2 sprays into both nostrils daily as needed for allergies or rhinitis (SEASONAL ALLERGIES).    . Fluticasone-Salmeterol (ADVAIR DISKUS) 100-50 MCG/DOSE AEPB Inhale 1 puff into the lungs 2 (two) times daily. (Patient taking differently: Inhale 1 puff into the lungs 2 (two) times daily as needed (SOB). ) 60 each 3  . furosemide (LASIX) 40 MG tablet Take 80 mg by mouth 2 (two) times daily.    Marland Kitchen glucose blood (ONE TOUCH ULTRA TEST) test strip Use to test blood sugar three times daily. Dx: E11.21 900 each 1  . hydrALAZINE (APRESOLINE) 100 MG tablet Take 100 mg by mouth 2 (two) times daily.    . insulin detemir (LEVEMIR) 100 UNIT/ML injection Inject 30 Units into the skin daily as needed (sugar levels).     . metolazone (ZAROXOLYN) 5 MG tablet Take 5 mg by mouth as directed. Tuesday and Saturday only    . montelukast (SINGULAIR) 10 MG tablet Take 1 tablet (10 mg total) by mouth at bedtime. 90 tablet 1  . Multiple Vitamins-Minerals (ONE-A-DAY MENS 50+ ADVANTAGE) TABS Take 1 tablet by mouth daily.    . nitroGLYCERIN (NITROSTAT) 0.4 MG SL tablet Place 1 tablet (0.4 mg total) under the tongue every 5 (five) minutes as needed for chest pain. 25 tablet 3  . PROAIR HFA  108 (90 Base) MCG/ACT inhaler INHALE 2 PUFFS INTO LUNGS EVERY 6 HOURS AS NEEDED FOR SHORTNESS OF BREATH (Patient taking differently: Inhale 2 puffs into the lungs every 6 (six) hours as needed for shortness of breath. ) 18 g 1  . furosemide (LASIX) 40 MG tablet TAKE 1 & 1/2 (ONE & ONE-HALF) TABLETS BY MOUTH TWICE DAILY (Patient taking differently: Take 60 mg by mouth daily. ) 270 tablet 1  . isosorbide mononitrate (IMDUR) 30 MG 24 hr tablet Take 1 tablet (30 mg total) by mouth daily. 90 tablet 3   No current facility-administered medications for this visit.    Facility-Administered Medications Ordered in Other Visits  Medication Dose Route Frequency Provider Last Rate Last Dose  . regadenoson (LEXISCAN) injection SOLN 0.4 mg  0.4 mg Intravenous Once Jerline Pain, MD        REVIEW OF SYSTEMS:   [X]  denotes positive finding, [ ]  denotes negative finding Cardiac  Comments:  Chest pain or chest pressure:    Shortness of breath upon exertion:    Short of breath when lying flat:    Irregular heart rhythm:        Vascular    Pain in calf, thigh, or hip brought on by ambulation:    Pain in feet at night that wakes you up from your sleep:     Blood clot in your veins:    Leg swelling:         Pulmonary    Oxygen at home:    Productive cough:     Wheezing:         Neurologic    Sudden weakness in arms or legs:  Sudden numbness in arms or legs:     Sudden onset of difficulty speaking or slurred speech:    Temporary loss of vision in one eye:     Problems with dizziness:         Gastrointestinal    Blood in stool:      Vomited blood:         Genitourinary    Burning when urinating:     Blood in urine:        Psychiatric    Major depression:         Hematologic    Bleeding problems:    Problems with blood clotting too easily:        Skin    Rashes or ulcers:        Constitutional    Fever or chills:     PHYSICAL EXAM:   Vitals:   11/03/18 1058  BP: 138/67   Pulse: 65  Resp: 20  Temp: 97.6 F (36.4 C)  SpO2: 97%  Weight: 96.1 kg  Height: 6\' 2"  (1.88 m)    GENERAL: The patient is a well-nourished male, in no acute distress. The vital signs are documented above. CARDIAC: There is a regular rate and rhythm.  VASCULAR: Palpable radial pulse on the left PULMONARY: Nonlabored respirations MUSCULOSKELETAL: There are no major deformities or cyanosis. NEUROLOGIC: No focal weakness or paresthesias are detected. SKIN: There are no ulcers or rashes noted. PSYCHIATRIC: The patient has a normal affect.  STUDIES:   I have reviewed the following: +-----------------+-------------+----------+--------------+ Right Cephalic   Diameter (cm)Depth (cm)   Findings    +-----------------+-------------+----------+--------------+ Shoulder                                not visualized +-----------------+-------------+----------+--------------+ Prox upper arm       0.17                              +-----------------+-------------+----------+--------------+ Mid upper arm        0.16                              +-----------------+-------------+----------+--------------+ Dist upper arm       0.16                              +-----------------+-------------+----------+--------------+ Antecubital fossa    0.29                              +-----------------+-------------+----------+--------------+ Prox forearm         0.31                              +-----------------+-------------+----------+--------------+ Mid forearm          0.18                              +-----------------+-------------+----------+--------------+ Dist forearm         0.17                              +-----------------+-------------+----------+--------------+  +-----------------+-------------+----------+--------+ Right Basilic  Diameter (cm)Depth (cm)Findings +-----------------+-------------+----------+--------+ Mid upper arm         0.26                        +-----------------+-------------+----------+--------+ Dist upper arm       0.26                        +-----------------+-------------+----------+--------+ Antecubital fossa    0.24                        +-----------------+-------------+----------+--------+  +-----------------+-------------+----------+------------+ Left Cephalic    Diameter (cm)Depth (cm)  Findings   +-----------------+-------------+----------+------------+ Shoulder             0.18                            +-----------------+-------------+----------+------------+ Prox upper arm       0.14                            +-----------------+-------------+----------+------------+ Mid upper arm        0.16                            +-----------------+-------------+----------+------------+ Dist upper arm       0.07                            +-----------------+-------------+----------+------------+ Antecubital fossa    0.51                            +-----------------+-------------+----------+------------+ Prox forearm         0.27                            +-----------------+-------------+----------+------------+ Mid forearm          0.23                            +-----------------+-------------+----------+------------+ Dist forearm         0.28               back of hand +-----------------+-------------+----------+------------+  +-----------------+-------------+----------+--------+ Left Basilic     Diameter (cm)Depth (cm)Findings +-----------------+-------------+----------+--------+ Mid upper arm        0.31                        +-----------------+-------------+----------+--------+ Dist upper arm       0.27                        +-----------------+-------------+----------+--------+ Antecubital fossa    0.23                        +-----------------+-------------+----------+--------+   UE  Arterial: Right: Patent brachial, radial, and ulnar arteries. Left: Patent brachial, radial, and ulnar arteries. High bifurcation.       Brachial measurements are ulnar/radial.  ASSESSMENT and PLAN   Chronic renal insufficiency: The patient's vein mapping today suggest that he has very small caliber veins.  Dr. Posey Pronto had recommended that we wait on placing a graft.  I discussed with the patient  that his left basilic vein is marginal however I think it is reasonable to try a for staged left basilic vein fistula creation to see if I can get his basilic vein to mature.  This will be scheduled in the immediate future.  The patient understands that if the vein does develop he would need a second stage procedure.  He also understands there is a reasonable chance that this does not mature.   Leia Alf, MD, FACS [facility] [phone] Pager [phone]

## 2018-11-03 NOTE — Progress Notes (Signed)
[facility] [place]  Patient name: Henry Carroll MRN: [id number] DOB: 05-20-1950 Sex: male   REQUESTING PROVIDER:    Dr. Posey Pronto   REASON FOR CONSULT:    Dialysis access  HISTORY OF PRESENT ILLNESS:   Henry Carroll is a 68 y.o. male, who is referred today for dialysis access.  Stage V renal disease.  He is right-handed.  He has never had a pacemaker.  There is renal failure secondary to hypertension and diabetes.  He also has a history of congestive heart failure and coronary artery disease status post CABG.  He takes a statin for hypercholesterolemia.  PAST MEDICAL HISTORY    Past Medical History:  Diagnosis Date  . Abnormal stress test 02/10/2015  . Acute on chronic systolic and diastolic heart failure, NYHA class 1 ([place]) 12/31/2014  . Anemia   . Anemia in chronic kidney disease 09/03/2012  . 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 Asthma   . Asthma, chronic 06/04/2012  . Asthma, chronic, mild intermittent, with acute exacerbation 12/22/2015  . Bilateral lower extremity edema 12/22/2015  . CAD (coronary artery disease) 02/18/2015  . Chronic combined systolic (congestive) and diastolic (congestive) heart failure ([place])    a. 12/31/14: 2D ECHO: EF 40-45%, HK of inf myocardium, G1DD, mod MR  . Chronic combined systolic and diastolic CHF (congestive heart failure) ([place]) 02/10/2015  . Chronic systolic heart failure ([place]) 02/25/2015  . CKD (chronic kidney disease), stage III (H[facility])    stage 3 kidney disease  . CKD (chronic kidney disease), stage V ([place]) 08/30/2018  . COLONIC POLYPS, HX OF 04/13/2009   Qualifier: Diagnosis of  By: West[place] Pam.   . Coronary artery disease    a. LHC 01/2015 - triple vessel CAD (mod oLM, mLAD, severe mRCA, intermediate branch stenosis, CTO of mCx). Plan CABG 02/2015.  Marland Kitchen Demand ischemia ([place]) 12/31/2014  . DM type 2, uncontrolled, with renal complications ([place]) 06/14/7739  . Elevated  troponin   . Essential hypertension 10/09/2013  . Folliculitis of perineum 10/01/2012  . Gastric erosion   . GERD (gastroesophageal reflux disease)   . Hyperlipidemia   . Hyperlipidemia with target LDL less than 100 12/31/2014  . Hypertension   . Hypertensive heart disease with heart failure ([place]) 02/25/2015  . IDA (iron deficiency anemia) 01/23/2016  . Melena 01/23/2016  . 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 Mitral regurgitation    a. Mild-mod by echo 12/2014.  . Morbid obesity ([place]) 12/29/2014  . Myocardial infarction [facility])    pt. states per [name] he has in the past  . NSVT (nonsustained ventricular tachycardia) ([place])    a. 9 beats during 01/2015 adm. BB titrated.  . Obesity    a. BMI 33  . Obesity (BMI 30-39.9) 05/05/2013  . OSA (obstructive sleep apnea) 01/16/2018   Mild obstructive sleep apnea overall with an AHI of 7.3/h and no significant central sleep apnea. Severe obstructive sleep apnea during REM sleep with an AHI of 34.3/h.  Now on CPAP at 6 cm H2O.   . Other malaise and fatigue 09/03/2012  . 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 PAD (peripheral artery disease) ([place]) 03/02/2015  . Sleep apnea    2019, Dr.Turner diagnoised   . Small bowel lesion   . Symptomatic anemia 08/29/2018  . 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 Type II diabetes mellitus ([place])      FAMILY HISTORY   Family History  Problem Relation  Age of Onset  . Diabetes Mother   . Heart disease Mother        later in life, age >67  . Heart disease Father        heart failure later in life  . Hypertension Father   . Stroke Sister   . Kidney disease Daughter   . Sudden death Daughter   . Colon cancer Paternal Uncle        Passed from colon CA  . Heart attack Neg Hx     SOCIAL HISTORY:   Social History   Socioeconomic History  . Marital status: Married    Spouse name: Not on file  . Number of children: Not on file  . Years of education: Not on file  .  Highest education level: Not on file  Occupational History  . Not on file  Social Needs  . Financial resource strain: Not hard at all  . Food insecurity    Worry: Never true    Inability: Never true  . Transportation needs    Medical: No    Non-medical: No  Tobacco Use  . Smoking status: Former Smoker    Years: 17.00    Types: Cigars    Quit date: 12/31/2014    Years since quitting: 3.8  . Smokeless tobacco: Never Used  Substance and Sexual Activity  . Alcohol use: Yes    Alcohol/week: 0.0 standard drinks    Comment: occasional glass of wine.  . Drug use: No  . Sexual activity: Yes    Birth control/protection: None  Lifestyle  . Physical activity    Days per week: 5 days    Minutes per session: 30 min  . Stress: Only a little  Relationships  . Social connections    Talks on phone: More than three times a week    Gets together: More than three times a week    Attends religious service: More than 4 times per year    Active member of club or organization: Yes    Attends meetings of clubs or organizations: 1 to 4 times per year    Relationship status: Married  . Intimate partner violence    Fear of current or ex partner: No    Emotionally abused: No    Physically abused: No    Forced sexual activity: No  Other Topics Concern  . Not on file  Social History Narrative  . Not on file    ALLERGIES:    No Known Allergies  CURRENT MEDICATIONS:    Current Outpatient Medications  Medication Sig Dispense Refill  . acetaminophen (TYLENOL) 500 MG tablet Take 1,000 mg by mouth 2 (two) times daily as needed for moderate pain.    Marland Kitchen albuterol (PROVENTIL) (2.5 MG/3ML) 0.083% nebulizer solution USE ONE VIAL IN NEBULIZER EVERY 6 HOURS AS NEEDED FOR WHEEZING FOR SHORTNESS OF BREATH (Patient taking differently: Take 2.5 mg by nebulization every 6 (six) hours as needed for shortness of breath. ) 300 mL 3  . amLODipine (NORVASC) 10 MG tablet Take 1 tablet (10 mg total) by mouth  daily. 90 tablet 1  . aspirin EC 81 MG tablet Take 1 tablet (81 mg total) by mouth daily. 90 tablet 3  . atorvastatin (LIPITOR) 40 MG tablet TAKE 1 TABLET BY MOUTH ONCE DAILY IN THE EVENING (Patient taking differently: Take 40 mg by mouth daily. ) 90 tablet 2  . carvedilol (COREG) 25 MG tablet Take 25 mg by mouth 2 (two) times daily with  a meal.    . Cholecalciferol ([employer] VITAMIN D3) 1000 UNITS capsule Take 2 capsules (2,000 Units total) by mouth daily. 60 capsule 3  . Cinnamon 500 MG capsule Take 1,000 mg by mouth 2 (two) times daily.     . Coenzyme Q10 (CO Q 10 PO) Take 200 mg by mouth daily.     Marland Kitchen epoetin alfa-epbx (RETACRIT) 16109 UNIT/ML injection 10,000 Units every 14 (fourteen) days.    . fluticasone (FLONASE) 50 MCG/ACT nasal spray Place 2 sprays into both nostrils daily as needed for allergies or rhinitis (SEASONAL ALLERGIES).    . Fluticasone-Salmeterol (ADVAIR DISKUS) 100-50 MCG/DOSE AEPB Inhale 1 puff into the lungs 2 (two) times daily. (Patient taking differently: Inhale 1 puff into the lungs 2 (two) times daily as needed (SOB). ) 60 each 3  . furosemide (LASIX) 40 MG tablet Take 80 mg by mouth 2 (two) times daily.    Marland Kitchen glucose blood (ONE TOUCH ULTRA TEST) test strip Use to test blood sugar three times daily. Dx: E11.21 900 each 1  . hydrALAZINE (APRESOLINE) 100 MG tablet Take 100 mg by mouth 2 (two) times daily.    . insulin detemir (LEVEMIR) 100 UNIT/ML injection Inject 30 Units into the skin daily as needed (sugar levels).     . metolazone (ZAROXOLYN) 5 MG tablet Take 5 mg by mouth as directed. Tuesday and Saturday only    . montelukast (SINGULAIR) 10 MG tablet Take 1 tablet (10 mg total) by mouth at bedtime. 90 tablet 1  . Multiple Vitamins-Minerals (ONE-A-DAY MENS 50+ ADVANTAGE) TABS Take 1 tablet by mouth daily.    . nitroGLYCERIN (NITROSTAT) 0.4 MG SL tablet Place 1 tablet (0.4 mg total) under the tongue every 5 (five) minutes as needed for chest pain. 25 tablet 3  . PROAIR HFA  108 (90 Base) MCG/ACT inhaler INHALE 2 PUFFS INTO LUNGS EVERY 6 HOURS AS NEEDED FOR SHORTNESS OF BREATH (Patient taking differently: Inhale 2 puffs into the lungs every 6 (six) hours as needed for shortness of breath. ) 18 g 1  . furosemide (LASIX) 40 MG tablet TAKE 1 & 1/2 (ONE & ONE-HALF) TABLETS BY MOUTH TWICE DAILY (Patient taking differently: Take 60 mg by mouth daily. ) 270 tablet 1  . isosorbide mononitrate (IMDUR) 30 MG 24 hr tablet Take 1 tablet (30 mg total) by mouth daily. 90 tablet 3   No current facility-administered medications for this visit.    Facility-Administered Medications Ordered in Other Visits  Medication Dose Route Frequency Provider Last Rate Last Dose  . regadenoson (LEXISCAN) injection SOLN 0.4 mg  0.4 mg Intravenous Once Jerline Pain, MD        REVIEW OF SYSTEMS:   [X]  denotes positive finding, [ ]  denotes negative finding Cardiac  Comments:  Chest pain or chest pressure:    Shortness of breath upon exertion:    Short of breath when lying flat:    Irregular heart rhythm:        Vascular    Pain in calf, thigh, or hip brought on by ambulation:    Pain in feet at night that wakes you up from your sleep:     Blood clot in your veins:    Leg swelling:         Pulmonary    Oxygen at home:    Productive cough:     Wheezing:         Neurologic    Sudden weakness in arms or legs:  Sudden numbness in arms or legs:     Sudden onset of difficulty speaking or slurred speech:    Temporary loss of vision in one eye:     Problems with dizziness:         Gastrointestinal    Blood in stool:      Vomited blood:         Genitourinary    Burning when urinating:     Blood in urine:        Psychiatric    Major depression:         Hematologic    Bleeding problems:    Problems with blood clotting too easily:        Skin    Rashes or ulcers:        Constitutional    Fever or chills:     PHYSICAL EXAM:   Vitals:   11/03/18 1058  BP: 138/67   Pulse: 65  Resp: 20  Temp: 97.6 F (36.4 C)  SpO2: 97%  Weight: 96.1 kg  Height: 6\' 2"  (1.88 m)    GENERAL: The patient is a well-nourished male, in no acute distress. The vital signs are documented above. CARDIAC: There is a regular rate and rhythm.  VASCULAR: Palpable radial pulse on the left PULMONARY: Nonlabored respirations MUSCULOSKELETAL: There are no major deformities or cyanosis. NEUROLOGIC: No focal weakness or paresthesias are detected. SKIN: There are no ulcers or rashes noted. PSYCHIATRIC: The patient has a normal affect.  STUDIES:   I have reviewed the following: +-----------------+-------------+----------+--------------+ Right Cephalic   Diameter (cm)Depth (cm)   Findings    +-----------------+-------------+----------+--------------+ Shoulder                                not visualized +-----------------+-------------+----------+--------------+ Prox upper arm       0.17                              +-----------------+-------------+----------+--------------+ Mid upper arm        0.16                              +-----------------+-------------+----------+--------------+ Dist upper arm       0.16                              +-----------------+-------------+----------+--------------+ Antecubital fossa    0.29                              +-----------------+-------------+----------+--------------+ Prox forearm         0.31                              +-----------------+-------------+----------+--------------+ Mid forearm          0.18                              +-----------------+-------------+----------+--------------+ Dist forearm         0.17                              +-----------------+-------------+----------+--------------+  +-----------------+-------------+----------+--------+ Right Basilic  Diameter (cm)Depth (cm)Findings +-----------------+-------------+----------+--------+ Mid upper arm         0.26                        +-----------------+-------------+----------+--------+ Dist upper arm       0.26                        +-----------------+-------------+----------+--------+ Antecubital fossa    0.24                        +-----------------+-------------+----------+--------+  +-----------------+-------------+----------+------------+ Left Cephalic    Diameter (cm)Depth (cm)  Findings   +-----------------+-------------+----------+------------+ Shoulder             0.18                            +-----------------+-------------+----------+------------+ Prox upper arm       0.14                            +-----------------+-------------+----------+------------+ Mid upper arm        0.16                            +-----------------+-------------+----------+------------+ Dist upper arm       0.07                            +-----------------+-------------+----------+------------+ Antecubital fossa    0.51                            +-----------------+-------------+----------+------------+ Prox forearm         0.27                            +-----------------+-------------+----------+------------+ Mid forearm          0.23                            +-----------------+-------------+----------+------------+ Dist forearm         0.28               back of hand +-----------------+-------------+----------+------------+  +-----------------+-------------+----------+--------+ Left Basilic     Diameter (cm)Depth (cm)Findings +-----------------+-------------+----------+--------+ Mid upper arm        0.31                        +-----------------+-------------+----------+--------+ Dist upper arm       0.27                        +-----------------+-------------+----------+--------+ Antecubital fossa    0.23                        +-----------------+-------------+----------+--------+   UE  Arterial: Right: Patent brachial, radial, and ulnar arteries. Left: Patent brachial, radial, and ulnar arteries. High bifurcation.       Brachial measurements are ulnar/radial.  ASSESSMENT and PLAN   Chronic renal insufficiency: The patient's vein mapping today suggest that he has very small caliber veins.  Dr. Posey Pronto had recommended that we wait on placing a graft.  I discussed with the patient  that his left basilic vein is marginal however I think it is reasonable to try a for staged left basilic vein fistula creation to see if I can get his basilic vein to mature.  This will be scheduled in the immediate future.  The patient understands that if the vein does develop he would need a second stage procedure.  He also understands there is a reasonable chance that this does not mature.   Leia Alf, MD, FACS [facility] [phone] Pager [phone]

## 2018-11-05 ENCOUNTER — Other Ambulatory Visit: Payer: Self-pay

## 2018-11-05 ENCOUNTER — Encounter (HOSPITAL_COMMUNITY)
Admission: RE | Admit: 2018-11-05 | Discharge: 2018-11-05 | Disposition: A | Discharge status: Home / Self Care | Payer: Medicare Other | Source: Ambulatory Visit | Attending: Nephrology | Admitting: Nephrology

## 2018-11-05 ENCOUNTER — Other Ambulatory Visit (HOSPITAL_COMMUNITY): Payer: Self-pay | Admitting: Nephrology

## 2018-11-05 ENCOUNTER — Encounter (HOSPITAL_COMMUNITY): Payer: Self-pay

## 2018-11-05 ENCOUNTER — Ambulatory Visit (INDEPENDENT_AMBULATORY_CARE_PROVIDER_SITE_OTHER): Payer: Medicare Other | Admitting: Urology

## 2018-11-05 DIAGNOSIS — D631 Anemia in chronic kidney disease: Secondary | ICD-10-CM | POA: Diagnosis not present

## 2018-11-05 DIAGNOSIS — N185 Chronic kidney disease, stage 5: Secondary | ICD-10-CM

## 2018-11-05 DIAGNOSIS — N5201 Erectile dysfunction due to arterial insufficiency: Secondary | ICD-10-CM

## 2018-11-05 DIAGNOSIS — N184 Chronic kidney disease, stage 4 (severe): Secondary | ICD-10-CM | POA: Diagnosis not present

## 2018-11-05 LAB — IRON AND TIBC
Iron: 32 ug/dL — ABNORMAL LOW (ref 45–182)
Saturation Ratios: 10 % — ABNORMAL LOW (ref 17.9–39.5)
TIBC: 315 ug/dL (ref 250–450)
UIBC: 283 ug/dL

## 2018-11-05 LAB — POCT HEMOGLOBIN-HEMACUE: Hemoglobin: 8.9 g/dL — ABNORMAL LOW (ref 13.0–17.0)

## 2018-11-05 LAB — FERRITIN: Ferritin: 237 ng/mL (ref 24–336)

## 2018-11-05 MED ORDER — EPOETIN ALFA-EPBX 10000 UNIT/ML IJ SOLN
20000.0000 [IU] | Freq: Once | INTRAMUSCULAR | Status: AC
  Administered 2018-11-05: 20000 [IU] via SUBCUTANEOUS

## 2018-11-05 MED ORDER — EPOETIN ALFA-EPBX 10000 UNIT/ML IJ SOLN
INTRAMUSCULAR | Status: AC
  Filled 2018-11-05: qty 2

## 2018-11-13 ENCOUNTER — Other Ambulatory Visit (HOSPITAL_COMMUNITY)
Admission: RE | Admit: 2018-11-13 | Discharge: 2018-11-13 | Disposition: A | Discharge status: Home / Self Care | Payer: Medicare Other | Source: Ambulatory Visit | Attending: Surgery | Admitting: Surgery

## 2018-11-13 ENCOUNTER — Encounter (HOSPITAL_COMMUNITY): Payer: Self-pay | Admitting: *Deleted

## 2018-11-13 ENCOUNTER — Other Ambulatory Visit: Payer: Self-pay

## 2018-11-13 DIAGNOSIS — Z20828 Contact with and (suspected) exposure to other viral communicable diseases: Secondary | ICD-10-CM | POA: Diagnosis not present

## 2018-11-13 DIAGNOSIS — Z01812 Encounter for preprocedural laboratory examination: Secondary | ICD-10-CM | POA: Insufficient documentation

## 2018-11-13 LAB — SARS CORONAVIRUS 2 (TAT 6-24 HRS): SARS Coronavirus 2: NEGATIVE

## 2018-11-13 NOTE — Progress Notes (Signed)
Pt denies SOB and chest pain. Pt stated that he is under the care of [name], Cardiology and Dr. Sherrie Mustache, PCP. Pt made aware to stop taking CO Q 10, Cinnamon, vitamins, fish oil and herbal medications. Do not take any NSAIDs ie: Ibuprofen, Advil, Naproxen (Aleve), Motrin, BC and Goody Powder. Pt verbalized understanding of all pre-op instructions. PA, Anesthesiology, asked to review pt history; see note.

## 2018-11-13 NOTE — Progress Notes (Signed)
Anesthesia Chart Review: Henry Carroll   Case: [id number] Date/Time: 11/14/18 0715   Procedure: FIRST STAGE BASILIC VEIN TRANSPOSITION LEFT ARM (Left )   Anesthesia type: Choice   Pre-op diagnosis: CHRONIC KIDNEY DISEASE FOR HEMODIALYSIS ACCESS   Location: [facility] OR ROOM 46 / [place] [facility] OR   Surgeon: Serafina Mitchell, MD      DISCUSSION: Patient is a 68 year old male scheduled for the above procedure.  History includes former smoker (quit 12/31/14), CAD (LIMA-LAD, free RIMA-Ramus, SVG-OM, SVG-PDA 02/18/15), chronic combined systolic and diastolic CHF, NSVT (2010), mitral regurgitation (mild-moderate 2016; trace MR 11/03/18 echo), HTN, HLD, PAD, asthma, DM2, GERD, OSA, CKD stage V, anemia, MGUS, obesity.   Last seen by Truitt Merle, NP on 10/28/18 for ED follow-up for acute on chronic CHF likely due to progressive CKD. Plan for repeat echo. No chest pain, although notes that if he is deemed eligible for future renal transplant then would likely have to undergo stress testing. She notes that he will likely need to start HD soon and may need renal biopsy for his chronic anemia at some point to rule out myeloma.  (He had bone marrow biopsy 09/29/18: "hypercellular marrow with 10% lambda restricted plasma cells.  Chromosome analysis was 46, XY.  FISH panel was normal.") * Echo done on 11/03/18 and showed overall stable EF 40-45% with continued medical management recommended by [name].  Presurgical COVID-19 test is in process.  Anesthesia team to evaluate on the day of surgery.   PROVIDERS: [name], NP is PCP Truitt Merle, NP/[place], Elta Guadeloupe, MD is cardiologist. Last visit 10/28/18.  Derek Jack, MD is HEM-ONC Elmarie Shiley, MD is nephrologist   LABS: He is for labs on arrival. HGB 8.9 on 11/05/18. Cr 2.08 and glucose 186 on 10/23/18.    IMAGES: CXR 10/23/18: IMPRESSION: - Mild interstitial pulmonary edema. - Bilateral lower lung fields peribronchial airspace consolidation cannot be  excluded   EKG: 10/23/18: SR with PACs. Non-specific ST/T wave abnormality. Prolonged QT (QT 412, QTc 484 ms).   CV: Echo 11/03/18: IMPRESSIONS  1. Left ventricular ejection fraction, by visual estimation, is 40 to 45%. The left ventricle has mild to moderately decreased function. Normal left ventricular size. There is mildly increased left ventricular hypertrophy. Severe hypokinesis of the  basal to mid inferior and inferolateral walls.  2. The mitral valve is normal in structure. Trace mitral valve regurgitation. No evidence of mitral stenosis.  3. The tricuspid valve is normal in structure. Tricuspid valve regurgitation is trivial.  4. The aortic valve is tricuspid Aortic valve regurgitation was not visualized by color flow Doppler. Mild aortic valve sclerosis without stenosis.  5. 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  6. Left atrial size was mildly dilated.  7. Left ventricular diastolic Doppler parameters are consistent with pseudonormalization pattern of LV diastolic filling.  8. Right atrial size was mildly dilated.  9. Global right ventricle has mildly reduced systolic function.The right ventricular size is mildly enlarged. No increase in right ventricular wall thickness. 10. The inferior vena cava is normal in size with greater than 50% respiratory variability, suggesting right atrial pressure of 3 mmHg. 11. Trivial pericardial effusion is present. 12. The tricuspid regurgitant velocity is 2.90 m/s, and with an assumed right atrial pressure of 3 mmHg, the estimated right ventricular systolic pressure is mildly elevated at 36.6 mmHg. - In comparison to the previous echocardiogram(s): 04/04/18 EF 50-55%.   Nuclear stress test 11/08/16:  The left  ventricular ejection fraction is moderately decreased (30-44%).  Nuclear stress EF: 40%.  There was no ST segment deviation noted during stress.  Findings consistent with prior myocardial infarction with  peri-infarct ischemia.  This is an intermediate risk study. 1. EF 40% with inferolateral hypokinesis.  2. Primarily fixed medium-sized, moderate intensity basal to mid inferolateral and basal inferior perfusion defect.  This is suggestive of prior infarction with mild peri-infarct ischemia.  - Intermediate risk study primarily due to low EF.  - Reviewed by [name], Continue medical management. No significant changes since CABG.   Carotid US 02/16/15: Summary: Findings suggest 1-39% internal carotid artery stenosis bilaterally. Vertebral arteries are patent with antegrade flow.   Last cath was 02/09/15 prior to CABG.    Past Medical History:  Diagnosis Date  . Abnormal stress test 02/10/2015  . Acute on chronic systolic and diastolic heart failure, NYHA class 1 ([place]) 12/31/2014  . Anemia   . Anemia in chronic kidney disease 09/03/2012  . 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 Asthma   . Asthma, chronic 06/04/2012  . Asthma, chronic, mild intermittent, with acute exacerbation 12/22/2015  . Bilateral lower extremity edema 12/22/2015  . CAD (coronary artery disease) 02/18/2015  . Chronic combined systolic (congestive) and diastolic (congestive) heart failure ([place])    a. 12/31/14: 2D ECHO: EF 40-45%, HK of inf myocardium, G1DD, mod MR  . Chronic combined systolic and diastolic CHF (congestive heart failure) ([place]) 02/10/2015  . Chronic systolic heart failure ([place]) 02/25/2015  . CKD (chronic kidney disease), stage III (HCC)    stage 3 kidney disease  . CKD (chronic kidney disease), stage V ([place]) 08/30/2018  . COLONIC POLYPS, HX OF 04/13/2009   Qualifier: Diagnosis of  By: [place] Pam.   . Coronary artery disease    a. LHC 01/2015 - triple vessel CAD (mod oLM, mLAD, severe mRCA, intermediate branch stenosis, CTO of mCx). Plan CABG 02/2015.  Marland Kitchen Demand ischemia ([place]) 12/31/2014  . DM type 2, uncontrolled, with renal complications ([place]) 5/70/1779  . Elevated troponin   . Essential  hypertension 10/09/2013  . Folliculitis of perineum 10/01/2012  . Gastric erosion   . GERD (gastroesophageal reflux disease)   . Hyperlipidemia   . Hyperlipidemia with target LDL less than 100 12/31/2014  . Hypertension   . Hypertensive heart disease with heart failure ([place]) 02/25/2015  . IDA (iron deficiency anemia) 01/23/2016  . Melena 01/23/2016  . 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 Mitral regurgitation    a. Mild-mod by echo 12/2014.  . Morbid obesity ([place]) 12/29/2014  . Myocardial infarction [facility])    pt. states per [name] he has in the past  . NSVT (nonsustained ventricular tachycardia) ([place])    a. 9 beats during 01/2015 adm. BB titrated.  . Obesity    a. BMI 33  . Obesity (BMI 30-39.9) 05/05/2013  . OSA (obstructive sleep apnea) 01/16/2018   Mild obstructive sleep apnea overall with an AHI of 7.3/h and no significant central sleep apnea. Severe obstructive sleep apnea during REM sleep with an AHI of 34.3/h.  Now on CPAP at 6 cm H2O.   . Other malaise and fatigue 09/03/2012  . 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 PAD (peripheral artery disease) ([place]) 03/02/2015  . Sleep apnea    2019, Dr.Turner diagnoised   . Small bowel lesion   . Symptomatic anemia 08/29/2018  . 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 Type II diabetes mellitus ([place])  Past Surgical History:  Procedure Laterality Date  . BACK SURGERY    . CARDIAC CATHETERIZATION N/A 02/09/2015   Procedure: Left Heart Cath and Coronary Angiography;  Surgeon: Burnell Blanks, MD;  Location: [place] CV LAB;  Service: Cardiovascular;  Laterality: N/A;  . COLONOSCOPY  2011   Dr. [name]: Ileocecal valve appeared normal, scattered pancolonic diverticulosis, difficult bowel prep making smaller lesions potentially missed. Recommended three-year follow-up colonoscopy.  . COLONOSCOPY  2003   Dr. [name]: Suspicious lesion at the ileocecal valve, multiple biopsies benign, pancolonic  diverticulosis.  . COLONOSCOPY N/A 02/15/2016   diverticulosis in sigmoid and descending colon, single 5 mm polyp at splenic flexure (Tubular adenoma)  . CORONARY ARTERY BYPASS GRAFT N/A 02/18/2015   Procedure: CORONARY ARTERY BYPASS GRAFTING (CABG) x  four, using bilateral internal mammary arteries and right leg greater saphenous vein harvested endoscopically;  Surgeon: [name], MD;  Location: [place] OR;  Service: Open Heart Surgery;  Laterality: N/A;  . ESOPHAGOGASTRODUODENOSCOPY N/A 02/15/2016   normal  . EYE SURGERY Right    July 2019   . GIVENS CAPSULE STUDY N/A 01/08/2017   couple of gastric and small bowel eroions in setting of aspirin 81 mg daily but nothing concerning, continue Hematology follow-up  . HERNIA REPAIR  [id number]   Umbilical  . LUMBAR [place] SURGERY  2006   L4 & L5  . REFRACTIVE SURGERY Left    2019   . REFRACTIVE SURGERY Right    2019  . TEE WITHOUT CARDIOVERSION N/A 02/18/2015   Procedure: TRANSESOPHAGEAL ECHOCARDIOGRAM (TEE);  Surgeon: [name], MD;  Location: [place];  Service: Open Heart Surgery;  Laterality: N/A;  . TONSILLECTOMY  1962    MEDICATIONS: No current facility-administered medications for this encounter.    Marland Kitchen acetaminophen (TYLENOL) 500 MG tablet  . albuterol (PROVENTIL) (2.5 MG/3ML) 0.083% nebulizer solution  . amLODipine (NORVASC) 10 MG tablet  . aspirin EC 81 MG tablet  . atorvastatin (LIPITOR) 40 MG tablet  . carvedilol (COREG) 25 MG tablet  . Cholecalciferol (VITAMIN D3) 50 MCG (2000 UT) TABS  . Cinnamon 500 MG capsule  . Coenzyme Q10 (CO Q 10 PO)  . epoetin alfa-epbx (RETACRIT) 24175 UNIT/ML injection  . fluticasone (FLONASE) 50 MCG/ACT nasal spray  . Fluticasone-Salmeterol (ADVAIR) 500-50 MCG/DOSE AEPB  . furosemide (LASIX) 40 MG tablet  . hydrALAZINE (APRESOLINE) 100 MG tablet  . insulin detemir (LEVEMIR) 100 UNIT/ML injection  . isosorbide mononitrate (IMDUR) 30 MG 24 hr tablet  . metolazone (ZAROXOLYN) 5 MG tablet  . montelukast  (SINGULAIR) 10 MG tablet  . Multiple Vitamin (MULTIVITAMIN WITH MINERALS) TABS tablet  . nitroGLYCERIN (NITROSTAT) 0.4 MG SL tablet  . PROAIR HFA 108 (90 Base) MCG/ACT inhaler  . glucose blood (ONE TOUCH ULTRA TEST) test strip   . regadenoson (LEXISCAN) injection SOLN 0.4 mg     Myra Gianotti, PA-C Surgical Short Stay/Anesthesiology [facility] Phone [phone] [facility] Phone [phone] 11/13/2018 3:42 PM

## 2018-11-13 NOTE — Anesthesia Preprocedure Evaluation (Addendum)
Anesthesia Evaluation  Patient identified by MRN, date of birth, ID band Patient awake    Reviewed: Allergy & Precautions, NPO status , Patient's Chart, lab work & pertinent test results, reviewed documented beta blocker date and time   History of Anesthesia Complications Negative for: history of anesthetic complications  Airway Mallampati: III  TM Distance: >3 FB Neck ROM: Full    Dental  (+) Dental Advisory Given, Upper Dentures, Partial Lower   Pulmonary asthma , sleep apnea and Continuous Positive Airway Pressure Ventilation , former smoker,    Pulmonary exam normal breath sounds clear to auscultation       Cardiovascular hypertension, Pt. on medications and Pt. on home beta blockers (-) angina+ CAD, + Past MI, + CABG, + Peripheral Vascular Disease and +CHF  (-) Cardiac Stents Normal cardiovascular exam Rhythm:Regular Rate:Normal  Echo 11/03/2018: 1. Left ventricular ejection fraction, by visual estimation, is 40 to 45%. The left ventricle has mild to moderately decreased function. Normal left ventricular size. There is mildly increased left ventricular hypertrophy. Severe hypokinesis of the  basal to mid inferior and inferolateral walls.  2. The mitral valve is normal in structure. Trace mitral valve regurgitation. No evidence of mitral stenosis.  3. The tricuspid valve is normal in structure. Tricuspid valve regurgitation is trivial.  4. The aortic valve is tricuspid Aortic valve regurgitation was not visualized by color flow Doppler. Mild aortic valve sclerosis without stenosis.  5. 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  6. Left atrial size was mildly dilated.  7. Left ventricular diastolic Doppler parameters are consistent with pseudonormalization pattern of LV diastolic filling.  8. Right atrial size was mildly dilated.  9. Global right ventricle has mildly reduced systolic function.The right  ventricular size is mildly enlarged. No increase in right ventricular wall thickness. 10. The inferior vena cava is normal in size with greater than 50% respiratory variability, suggesting right atrial pressure of 3 mmHg. 11. Trivial pericardial effusion is present. 12. The tricuspid regurgitant velocity is 2.90 m/s, and with an assumed right atrial pressure of 3 mmHg, the estimated right ventricular systolic pressure is mildly elevated at 36.6 mmHg.   Neuro/Psych negative neurological ROS  negative psych ROS   GI/Hepatic Neg liver ROS, PUD, GERD  ,  Endo/Other  diabetes, Type 2, Insulin DependentObesity   Renal/GU ESRFRenal disease     Musculoskeletal  (+) Arthritis ,   Abdominal   Peds  Hematology  (+) Blood dyscrasia, anemia ,   Anesthesia Other Findings Day of surgery medications reviewed with the patient.  Reproductive/Obstetrics                          Anesthesia Physical Anesthesia Plan  ASA: IV  Anesthesia Plan: General   Post-op Pain Management:    Induction: Intravenous  PONV Risk Score and Plan: 2 and Dexamethasone and Ondansetron  Airway Management Planned: LMA  Additional Equipment:   Intra-op Plan:   Post-operative Plan: Extubation in OR  Informed Consent: I have reviewed the patients History and Physical, chart, labs and discussed the procedure including the risks, benefits and alternatives for the proposed anesthesia with the patient or authorized representative who has indicated his/her understanding and acceptance.     Dental advisory given  Plan Discussed with: CRNA  Anesthesia Plan Comments: (PAT note written 11/13/2018 by Myra Gianotti, PA-C. )      Anesthesia Quick Evaluation

## 2018-11-14 ENCOUNTER — Encounter (HOSPITAL_COMMUNITY)
Admission: RE | Disposition: A | Discharge status: Home / Self Care | Payer: Self-pay | Source: Home / Self Care | Attending: Surgery

## 2018-11-14 ENCOUNTER — Encounter (HOSPITAL_COMMUNITY): Payer: Self-pay

## 2018-11-14 ENCOUNTER — Ambulatory Visit (HOSPITAL_COMMUNITY): Payer: Medicare Other | Admitting: Vascular Surgery

## 2018-11-14 ENCOUNTER — Ambulatory Visit (HOSPITAL_COMMUNITY)
Admission: RE | Admit: 2018-11-14 | Discharge: 2018-11-14 | Disposition: A | Discharge status: Home / Self Care | Payer: Medicare Other | Attending: Surgery | Admitting: Surgery

## 2018-11-14 DIAGNOSIS — E785 Hyperlipidemia, unspecified: Secondary | ICD-10-CM | POA: Insufficient documentation

## 2018-11-14 DIAGNOSIS — M19042 Primary osteoarthritis, left hand: Secondary | ICD-10-CM | POA: Diagnosis not present

## 2018-11-14 DIAGNOSIS — Z87891 Personal history of nicotine dependence: Secondary | ICD-10-CM | POA: Diagnosis not present

## 2018-11-14 DIAGNOSIS — Z6827 Body mass index (BMI) 27.0-27.9, adult: Secondary | ICD-10-CM | POA: Diagnosis not present

## 2018-11-14 DIAGNOSIS — J45909 Unspecified asthma, uncomplicated: Secondary | ICD-10-CM | POA: Insufficient documentation

## 2018-11-14 DIAGNOSIS — E669 Obesity, unspecified: Secondary | ICD-10-CM | POA: Insufficient documentation

## 2018-11-14 DIAGNOSIS — G4733 Obstructive sleep apnea (adult) (pediatric): Secondary | ICD-10-CM | POA: Diagnosis not present

## 2018-11-14 DIAGNOSIS — I252 Old myocardial infarction: Secondary | ICD-10-CM | POA: Diagnosis not present

## 2018-11-14 DIAGNOSIS — D631 Anemia in chronic kidney disease: Secondary | ICD-10-CM | POA: Diagnosis not present

## 2018-11-14 DIAGNOSIS — I132 Hypertensive heart and chronic kidney disease with heart failure and with stage 5 chronic kidney disease, or end stage renal disease: Secondary | ICD-10-CM | POA: Diagnosis not present

## 2018-11-14 DIAGNOSIS — N186 End stage renal disease: Secondary | ICD-10-CM | POA: Diagnosis not present

## 2018-11-14 DIAGNOSIS — Z7951 Long term (current) use of inhaled steroids: Secondary | ICD-10-CM | POA: Insufficient documentation

## 2018-11-14 DIAGNOSIS — Z7982 Long term (current) use of aspirin: Secondary | ICD-10-CM | POA: Diagnosis not present

## 2018-11-14 DIAGNOSIS — I2510 Atherosclerotic heart disease of native coronary artery without angina pectoris: Secondary | ICD-10-CM | POA: Insufficient documentation

## 2018-11-14 DIAGNOSIS — E7800 Pure hypercholesterolemia, unspecified: Secondary | ICD-10-CM | POA: Diagnosis not present

## 2018-11-14 DIAGNOSIS — E1122 Type 2 diabetes mellitus with diabetic chronic kidney disease: Secondary | ICD-10-CM | POA: Insufficient documentation

## 2018-11-14 DIAGNOSIS — I5042 Chronic combined systolic (congestive) and diastolic (congestive) heart failure: Secondary | ICD-10-CM | POA: Diagnosis not present

## 2018-11-14 DIAGNOSIS — Z79899 Other long term (current) drug therapy: Secondary | ICD-10-CM | POA: Diagnosis not present

## 2018-11-14 DIAGNOSIS — E1151 Type 2 diabetes mellitus with diabetic peripheral angiopathy without gangrene: Secondary | ICD-10-CM | POA: Diagnosis not present

## 2018-11-14 DIAGNOSIS — Z794 Long term (current) use of insulin: Secondary | ICD-10-CM | POA: Insufficient documentation

## 2018-11-14 DIAGNOSIS — N185 Chronic kidney disease, stage 5: Secondary | ICD-10-CM

## 2018-11-14 DIAGNOSIS — Z951 Presence of aortocoronary bypass graft: Secondary | ICD-10-CM | POA: Diagnosis not present

## 2018-11-14 HISTORY — PX: BASCILIC VEIN TRANSPOSITION: SHX5742

## 2018-11-14 HISTORY — DX: Presence of dental prosthetic device (complete) (partial): Z97.2

## 2018-11-14 HISTORY — DX: Pneumonia, unspecified organism: J18.9

## 2018-11-14 LAB — POCT I-STAT, CHEM 8
BUN: 116 mg/dL — ABNORMAL HIGH (ref 8–23)
Calcium, Ion: 1.2 mmol/L (ref 1.15–1.40)
Chloride: 98 mmol/L (ref 98–111)
Creatinine, Ser: 6.7 mg/dL — ABNORMAL HIGH (ref 0.61–1.24)
Glucose, Bld: 156 mg/dL — ABNORMAL HIGH (ref 70–99)
HCT: 28 % — ABNORMAL LOW (ref 39.0–52.0)
Hemoglobin: 9.5 g/dL — ABNORMAL LOW (ref 13.0–17.0)
Potassium: 4.3 mmol/L (ref 3.5–5.1)
Sodium: 136 mmol/L (ref 135–145)
TCO2: 25 mmol/L (ref 22–32)

## 2018-11-14 LAB — GLUCOSE, CAPILLARY
Glucose-Capillary: 160 mg/dL — ABNORMAL HIGH (ref 70–99)
Glucose-Capillary: 181 mg/dL — ABNORMAL HIGH (ref 70–99)

## 2018-11-14 SURGERY — TRANSPOSITION, VEIN, BASILIC
Anesthesia: General | Laterality: Left

## 2018-11-14 MED ORDER — LIDOCAINE 2% (20 MG/ML) 5 ML SYRINGE
INTRAMUSCULAR | Status: AC
  Filled 2018-11-14: qty 5

## 2018-11-14 MED ORDER — SODIUM CHLORIDE 0.9 % IV SOLN
INTRAVENOUS | Status: DC | PRN
  Administered 2018-11-14: 500 mL

## 2018-11-14 MED ORDER — EPHEDRINE SULFATE 50 MG/ML IJ SOLN
INTRAMUSCULAR | Status: DC | PRN
  Administered 2018-11-14 (×3): 5 mg via INTRAVENOUS

## 2018-11-14 MED ORDER — FENTANYL CITRATE (PF) 250 MCG/5ML IJ SOLN
INTRAMUSCULAR | Status: DC | PRN
  Administered 2018-11-14: 50 ug via INTRAVENOUS
  Administered 2018-11-14: 25 ug via INTRAVENOUS

## 2018-11-14 MED ORDER — FENTANYL CITRATE (PF) 250 MCG/5ML IJ SOLN
INTRAMUSCULAR | Status: AC
  Filled 2018-11-14: qty 5

## 2018-11-14 MED ORDER — LIDOCAINE HCL (CARDIAC) PF 100 MG/5ML IV SOSY
PREFILLED_SYRINGE | INTRAVENOUS | Status: DC | PRN
  Administered 2018-11-14: 100 mg via INTRAVENOUS

## 2018-11-14 MED ORDER — DEXAMETHASONE SODIUM PHOSPHATE 10 MG/ML IJ SOLN
INTRAMUSCULAR | Status: AC
  Filled 2018-11-14: qty 1

## 2018-11-14 MED ORDER — ACETAMINOPHEN 500 MG PO TABS
ORAL_TABLET | ORAL | Status: AC
  Administered 2018-11-14: 07:00:00 1000 mg via ORAL
  Filled 2018-11-14: qty 2

## 2018-11-14 MED ORDER — PROPOFOL 10 MG/ML IV BOLUS
INTRAVENOUS | Status: AC
  Filled 2018-11-14: qty 20

## 2018-11-14 MED ORDER — ONDANSETRON HCL 4 MG/2ML IJ SOLN
INTRAMUSCULAR | Status: DC | PRN
  Administered 2018-11-14: 4 mg via INTRAVENOUS

## 2018-11-14 MED ORDER — CHLORHEXIDINE GLUCONATE 4 % EX LIQD: 60.0000 mL | Freq: Once | CUTANEOUS | Status: DC

## 2018-11-14 MED ORDER — LIDOCAINE-EPINEPHRINE (PF) 1 %-1:200000 IJ SOLN
INTRAMUSCULAR | Status: AC
  Filled 2018-11-14: qty 30

## 2018-11-14 MED ORDER — CEFAZOLIN SODIUM-DEXTROSE 2-4 GM/100ML-% IV SOLN
2.0000 g | INTRAVENOUS | Status: AC
  Administered 2018-11-14: 2 g via INTRAVENOUS

## 2018-11-14 MED ORDER — PROPOFOL 10 MG/ML IV BOLUS
INTRAVENOUS | Status: DC | PRN
  Administered 2018-11-14: 150 mg via INTRAVENOUS

## 2018-11-14 MED ORDER — ACETAMINOPHEN 500 MG PO TABS
1000.0000 mg | ORAL_TABLET | Freq: Once | ORAL | Status: AC
  Administered 2018-11-14: 07:00:00 1000 mg via ORAL

## 2018-11-14 MED ORDER — SODIUM CHLORIDE 0.9 % IV SOLN
INTRAVENOUS | Status: DC | PRN
  Administered 2018-11-14: 25 ug/min via INTRAVENOUS

## 2018-11-14 MED ORDER — HYDROCODONE-ACETAMINOPHEN 5-325 MG PO TABS: 1.0000 | ORAL_TABLET | Freq: Four times a day (QID) | ORAL | 0 refills | Status: DC | PRN

## 2018-11-14 MED ORDER — SODIUM CHLORIDE 0.9 % IV SOLN
INTRAVENOUS | Status: AC
  Filled 2018-11-14: qty 1.2

## 2018-11-14 MED ORDER — ONDANSETRON HCL 4 MG/2ML IJ SOLN
INTRAMUSCULAR | Status: AC
  Filled 2018-11-14: qty 2

## 2018-11-14 MED ORDER — SODIUM CHLORIDE 0.9 % IV SOLN
INTRAVENOUS | Status: DC
  Administered 2018-11-14: 08:00:00 via INTRAVENOUS

## 2018-11-14 MED ORDER — 0.9 % SODIUM CHLORIDE (POUR BTL) OPTIME
TOPICAL | Status: DC | PRN
  Administered 2018-11-14: 1000 mL

## 2018-11-14 MED ORDER — CEFAZOLIN SODIUM-DEXTROSE 2-4 GM/100ML-% IV SOLN
INTRAVENOUS | Status: AC
  Filled 2018-11-14: qty 100

## 2018-11-14 MED ORDER — DEXAMETHASONE SODIUM PHOSPHATE 10 MG/ML IJ SOLN
INTRAMUSCULAR | Status: DC | PRN
  Administered 2018-11-14: 5 mg via INTRAVENOUS

## 2018-11-14 SURGICAL SUPPLY — 39 items
ADH SKN CLS APL DERMABOND .7 (GAUZE/BANDAGES/DRESSINGS) ×1
ARMBAND PINK RESTRICT EXTREMIT (MISCELLANEOUS) ×3 IMPLANT
CANISTER SUCT 3000ML PPV (MISCELLANEOUS) ×3 IMPLANT
CLIP VESOCCLUDE MED 24/CT (CLIP) ×2 IMPLANT
CLIP VESOCCLUDE MED 6/CT (CLIP) IMPLANT
CLIP VESOCCLUDE SM WIDE 24/CT (CLIP) ×2 IMPLANT
CLIP VESOCCLUDE SM WIDE 6/CT (CLIP) IMPLANT
COVER PROBE W GEL 5X96 (DRAPES) ×3 IMPLANT
COVER WAND RF STERILE (DRAPES) ×3 IMPLANT
DERMABOND ADVANCED (GAUZE/BANDAGES/DRESSINGS) ×2
DERMABOND ADVANCED .7 DNX12 (GAUZE/BANDAGES/DRESSINGS) ×1 IMPLANT
ELECT REM PT RETURN 9FT ADLT (ELECTROSURGICAL) ×3
ELECTRODE REM PT RTRN 9FT ADLT (ELECTROSURGICAL) ×1 IMPLANT
GLOVE BIO SURGEON STRL SZ7.5 (GLOVE) ×2 IMPLANT
GLOVE BIOGEL PI IND STRL 6.5 (GLOVE) IMPLANT
GLOVE BIOGEL PI IND STRL 7.5 (GLOVE) ×1 IMPLANT
GLOVE BIOGEL PI INDICATOR 6.5 (GLOVE) ×2
GLOVE BIOGEL PI INDICATOR 7.5 (GLOVE) ×2
GLOVE SS BIOGEL STRL SZ 6.5 (GLOVE) IMPLANT
GLOVE SUPERSENSE BIOGEL SZ 6.5 (GLOVE) ×4
GLOVE SURG SS PI 7.5 STRL IVOR (GLOVE) ×3 IMPLANT
GOWN STRL REUS W/ TWL LRG LVL3 (GOWN DISPOSABLE) ×2 IMPLANT
GOWN STRL REUS W/ TWL XL LVL3 (GOWN DISPOSABLE) ×1 IMPLANT
GOWN STRL REUS W/TWL LRG LVL3 (GOWN DISPOSABLE) ×3
GOWN STRL REUS W/TWL XL LVL3 (GOWN DISPOSABLE) ×6
HEMOSTAT SNOW SURGICEL 2X4 (HEMOSTASIS) IMPLANT
KIT BASIN OR (CUSTOM PROCEDURE TRAY) ×3 IMPLANT
KIT TURNOVER KIT B (KITS) ×3 IMPLANT
NS IRRIG 1000ML POUR BTL (IV SOLUTION) ×3 IMPLANT
PACK CV ACCESS (CUSTOM PROCEDURE TRAY) ×3 IMPLANT
PAD ARMBOARD 7.5X6 YLW CONV (MISCELLANEOUS) ×6 IMPLANT
SUT PROLENE 6 0 CC (SUTURE) ×3 IMPLANT
SUT SILK 2 0 SH (SUTURE) ×2 IMPLANT
SUT VIC AB 3-0 SH 27 (SUTURE) ×3
SUT VIC AB 3-0 SH 27X BRD (SUTURE) ×1 IMPLANT
SUT VICRYL 4-0 PS2 18IN ABS (SUTURE) ×3 IMPLANT
TOWEL GREEN STERILE (TOWEL DISPOSABLE) ×3 IMPLANT
UNDERPAD 30X30 (UNDERPADS AND DIAPERS) ×3 IMPLANT
WATER STERILE IRR 1000ML POUR (IV SOLUTION) ×3 IMPLANT

## 2018-11-14 NOTE — Anesthesia Procedure Notes (Signed)
Procedure Name: LMA Insertion Date/Time: 11/14/2018 7:41 AM Performed by: Gaylene Brooks, CRNA Pre-anesthesia Checklist: Patient identified, Emergency Drugs available, Suction available and Patient being monitored Patient Re-evaluated:Patient Re-evaluated prior to induction Oxygen Delivery Method: Circle System Utilized Preoxygenation: Pre-oxygenation with 100% oxygen Induction Type: IV induction Ventilation: Mask ventilation without difficulty LMA: LMA inserted LMA Size: 5.0 Number of attempts: 1 Airway Equipment and Method: Bite block Placement Confirmation: positive ETCO2 Tube secured with: Tape Dental Injury: Teeth and Oropharynx as per pre-operative assessment

## 2018-11-14 NOTE — Transfer of Care (Signed)
Immediate Anesthesia Transfer of Care Note  Patient: MAKO PELFREY  Procedure(s) Performed: FIRST STAGE BASILIC VEIN TRANSPOSITION LEFT ARM (Left )  Patient Location: PACU  Anesthesia Type:General  Level of Consciousness: sedated  Airway & Oxygen Therapy: Patient Spontanous Breathing and Patient connected to nasal cannula oxygen  Post-op Assessment: Report given to RN and Post -op Vital signs reviewed and stable  Post vital signs: Reviewed and stable  Last Vitals:  Vitals Value Taken Time  BP 137/72 11/14/18 0850  Temp    Pulse 59 11/14/18 0851  Resp 16 11/14/18 0851  SpO2 100 % 11/14/18 0851  Vitals shown include unvalidated device data.  Last Pain:  Vitals:   11/14/18 0659  TempSrc:   PainSc: 0-No pain      Patients Stated Pain Goal: 3 (59/29/24 4628)  Complications: No apparent anesthesia complications

## 2018-11-14 NOTE — Interval H&P Note (Signed)
History and Physical Interval Note:  11/14/2018 7:24 AM  Henry Carroll  has presented today for surgery, with the diagnosis of CHRONIC KIDNEY DISEASE FOR HEMODIALYSIS ACCESS.  The various methods of treatment have been discussed with the patient and family. After consideration of risks, benefits and other options for treatment, the patient has consented to  Procedure(s): FIRST STAGE BASILIC VEIN TRANSPOSITION LEFT ARM (Left) as a surgical intervention.  The patient's history has been reviewed, patient examined, no change in status, stable for surgery.  I have reviewed the patient's chart and labs.  Questions were answered to the patient's satisfaction.     Annamarie Major

## 2018-11-14 NOTE — Op Note (Signed)
    Patient name: ZECHARIAH BISSONNETTE MRN: [id number] DOB: 1950-12-16 Sex: male  11/14/2018 Pre-operative Diagnosis: CKD4 Post-operative diagnosis:  Same Surgeon:  Annamarie Major Assistants:  Arlee Muslim Procedure:   First stage left basilic vein fistula Anesthesia:  General Blood Loss:  Minimal Specimens:  none  Findings: 3 mm basilic vein.  3 mm mildly calcified brachial artery  Indications: The patient is not yet on dialysis.  He was referred for fistula creation.  He had marginal surface veins on his imaging.  We elected to attempt a basilic vein fistula.  Risks and benefits were discussed.  All questions were answered.  Procedure:  The patient was identified in the holding area and taken to [place] [facility]  The patient was then placed supine on the table. general anesthesia was administered.  The patient was prepped and draped in the usual sterile fashion.  A time out was called and antibiotics were administered.  Ultrasound was used to evaluate the basilic vein in the upper arm.  It appeared to be larger than it did on his preoperative imaging, measuring approximately 3 mm.  An oblique incision was made just proximal to the antecubital crease.  I first dissected out the brachial artery.  This is a 3 mm artery with mild calcification.  It was encircled proximally distally with Vesseloops.  I then dissected out the basilic vein.  Side branches were ligated between silk ties.  The vein measured approximately 3 mm.  It was fully mobilized and marked for orientation.  He was ligated distally with a 2-0 silk tie.  It distended nicely with heparin saline.  Next, the brachial artery was occluded with vascular clamps and #11 blade was used to make an arteriotomy which was extended longitudinally with Potts scissors.  The vein was cut the appropriate length and spatulated for the size arteriotomy.  A running anastomosis was created in a end-to-side fashion.  Prior to completion the appropriate flushing  maneuvers were performed the anastomosis was completed.  There was a palpable thrill within the fistula.  There were brisk radial and ulnar Doppler signals at the wrist.  The wound was irrigated.  Hemostasis was achieved.  The incision was closed 2 layers of Vicryl followed by Dermabond.   Disposition: To PACU stable   V. Annamarie Major, M.D., [facility] [place] Office: [phone] Pager:  [phone]

## 2018-11-14 NOTE — Discharge Instructions (Signed)
° °  [facility] [place] ° °Discharge Instructions ° °AV Fistula or Graft Surgery for Dialysis Access ° °Please refer to the following instructions for your post-procedure care. Your surgeon or physician assistant will discuss any changes with you. ° °Activity ° °You may drive the day following your surgery, if you are comfortable and no longer taking prescription pain medication. Resume full activity as the soreness in your incision resolves. ° °Bathing/Showering ° °You may shower after you go home. Keep your incision dry for 48 hours. Do not soak in a bathtub, hot tub, or swim until the incision heals completely. You may not shower if you have a hemodialysis catheter. ° °Incision Care ° °Clean your incision with mild soap and water after 48 hours. Pat the area dry with a clean towel. You do not need a bandage unless otherwise instructed. Do not apply any ointments or creams to your incision. You may have skin glue on your incision. Do not peel it off. It will come off on its own in about one week. Your arm may swell a bit after surgery. To reduce swelling use pillows to elevate your arm so it is above your heart. Your doctor will tell you if you need to lightly wrap your arm with an ACE bandage. ° °Diet ° °Resume your normal diet. There are not special food restrictions following this procedure. In order to heal from your surgery, it is CRITICAL to get adequate nutrition. Your body requires vitamins, minerals, and protein. Vegetables are the best source of vitamins and minerals. Vegetables also provide the perfect balance of protein. Processed food has little nutritional value, so try to avoid this. ° °Medications ° °Resume taking all of your medications. If your incision is causing pain, you may take over-the counter pain relievers such as acetaminophen (Tylenol). If you were prescribed a stronger pain medication, please be aware these medications can cause nausea and constipation. Prevent  nausea by taking the medication with a snack or meal. Avoid constipation by drinking plenty of fluids and eating foods with high amount of fiber, such as fruits, vegetables, and grains. Do not take Tylenol if you are taking prescription pain medications. ° ° ° ° °Follow up °Your surgeon may want to see you in the office following your access surgery. If so, this will be arranged at the time of your surgery. ° °Please call us immediately for any of the following conditions: ° °Increased pain, redness, drainage (pus) from your incision site °Fever of 101 degrees or higher °Severe or worsening pain at your incision site °Hand pain or numbness. ° °Reduce your risk of vascular disease: ° °Stop smoking. If you would like help, call [facility] at 1-800-QUIT-NOW (1-800-784-8669) or [place] at 336-586-4000 ° °Manage your cholesterol °Maintain a desired weight °Control your diabetes °Keep your blood pressure down ° °Dialysis ° °It will take several weeks to several months for your new dialysis access to be ready for use. Your surgeon will determine when it is OK to use it. Your nephrologist will continue to direct your dialysis. You can continue to use your Permcath until your new access is ready for use. ° °If you have any questions, please call the office at [phone]. ° °

## 2018-11-15 NOTE — Anesthesia Postprocedure Evaluation (Signed)
Anesthesia Post Note  Patient: KAJ VASIL  Procedure(s) Performed: FIRST STAGE BASILIC VEIN TRANSPOSITION LEFT ARM (Left )     Patient location during evaluation: PACU Anesthesia Type: General Level of consciousness: awake and alert Pain management: pain level controlled Vital Signs Assessment: post-procedure vital signs reviewed and stable Respiratory status: spontaneous breathing, nonlabored ventilation, respiratory function stable and patient connected to nasal cannula oxygen Cardiovascular status: blood pressure returned to baseline and stable Postop Assessment: no apparent nausea or vomiting Anesthetic complications: no    Last Vitals:  Vitals:   11/14/18 0905 11/14/18 0920  BP: 129/64 129/69  Pulse: (!) 59 (!) 59  Resp: 16 15  Temp:    SpO2: 96% 94%    Last Pain:  Vitals:   11/14/18 0920  TempSrc:   PainSc: 0-No pain                 Catalina Gravel

## 2018-11-16 ENCOUNTER — Encounter (HOSPITAL_COMMUNITY): Payer: Self-pay | Admitting: Surgery

## 2018-11-19 ENCOUNTER — Inpatient Hospital Stay (HOSPITAL_COMMUNITY): Payer: Medicare Other | Attending: Hematology

## 2018-11-19 ENCOUNTER — Other Ambulatory Visit: Payer: Self-pay

## 2018-11-19 ENCOUNTER — Encounter (HOSPITAL_COMMUNITY)
Admission: RE | Admit: 2018-11-19 | Discharge: 2018-11-19 | Disposition: A | Discharge status: Home / Self Care | Payer: Medicare Other | Source: Ambulatory Visit | Attending: Nephrology | Admitting: Nephrology

## 2018-11-19 ENCOUNTER — Encounter (HOSPITAL_COMMUNITY): Payer: Self-pay

## 2018-11-19 ENCOUNTER — Other Ambulatory Visit: Payer: Self-pay | Admitting: Student

## 2018-11-19 ENCOUNTER — Encounter (HOSPITAL_COMMUNITY): Payer: Medicare Other

## 2018-11-19 ENCOUNTER — Other Ambulatory Visit: Payer: Self-pay | Admitting: Physician Assistant

## 2018-11-19 DIAGNOSIS — D509 Iron deficiency anemia, unspecified: Secondary | ICD-10-CM | POA: Diagnosis not present

## 2018-11-19 DIAGNOSIS — D472 Monoclonal gammopathy: Secondary | ICD-10-CM | POA: Insufficient documentation

## 2018-11-19 DIAGNOSIS — N183 Chronic kidney disease, stage 3 unspecified: Secondary | ICD-10-CM | POA: Insufficient documentation

## 2018-11-19 DIAGNOSIS — N184 Chronic kidney disease, stage 4 (severe): Secondary | ICD-10-CM | POA: Insufficient documentation

## 2018-11-19 DIAGNOSIS — D631 Anemia in chronic kidney disease: Secondary | ICD-10-CM | POA: Diagnosis not present

## 2018-11-19 DIAGNOSIS — C9000 Multiple myeloma not having achieved remission: Secondary | ICD-10-CM

## 2018-11-19 LAB — CBC WITH DIFFERENTIAL/PLATELET
Abs Immature Granulocytes: 0.01 10*3/uL (ref 0.00–0.07)
Basophils Absolute: 0.1 10*3/uL (ref 0.0–0.1)
Basophils Relative: 1 %
Eosinophils Absolute: 0.4 10*3/uL (ref 0.0–0.5)
Eosinophils Relative: 9 %
HCT: 29.7 % — ABNORMAL LOW (ref 39.0–52.0)
Hemoglobin: 9.4 g/dL — ABNORMAL LOW (ref 13.0–17.0)
Immature Granulocytes: 0 %
Lymphocytes Relative: 13 %
Lymphs Abs: 0.6 10*3/uL — ABNORMAL LOW (ref 0.7–4.0)
MCH: 30.9 pg (ref 26.0–34.0)
MCHC: 31.6 g/dL (ref 30.0–36.0)
MCV: 97.7 fL (ref 80.0–100.0)
Monocytes Absolute: 0.5 10*3/uL (ref 0.1–1.0)
Monocytes Relative: 10 %
Neutro Abs: 3.2 10*3/uL (ref 1.7–7.7)
Neutrophils Relative %: 67 %
Platelets: 219 10*3/uL (ref 150–400)
RBC: 3.04 MIL/uL — ABNORMAL LOW (ref 4.22–5.81)
RDW: 13.4 % (ref 11.5–15.5)
WBC: 4.8 10*3/uL (ref 4.0–10.5)
nRBC: 0 % (ref 0.0–0.2)

## 2018-11-19 LAB — COMPREHENSIVE METABOLIC PANEL
ALT: 7 U/L (ref 0–44)
AST: 18 U/L (ref 15–41)
Albumin: 3.5 g/dL (ref 3.5–5.0)
Alkaline Phosphatase: 46 U/L (ref 38–126)
Anion gap: 13 (ref 5–15)
BUN: 131 mg/dL — ABNORMAL HIGH (ref 8–23)
CO2: 27 mmol/L (ref 22–32)
Calcium: 9.2 mg/dL (ref 8.9–10.3)
Chloride: 97 mmol/L — ABNORMAL LOW (ref 98–111)
Creatinine, Ser: 6.68 mg/dL — ABNORMAL HIGH (ref 0.61–1.24)
GFR calc Af Amer: 9 mL/min — ABNORMAL LOW (ref >60)
GFR calc non Af Amer: 8 mL/min — ABNORMAL LOW (ref >60)
Glucose, Bld: 100 mg/dL — ABNORMAL HIGH (ref 70–99)
Potassium: 4.5 mmol/L (ref 3.5–5.1)
Sodium: 137 mmol/L (ref 135–145)
Total Bilirubin: 0.2 mg/dL — ABNORMAL LOW (ref 0.3–1.2)
Total Protein: 6.5 g/dL (ref 6.5–8.1)

## 2018-11-19 LAB — IRON AND TIBC
Iron: 27 ug/dL — ABNORMAL LOW (ref 45–182)
Saturation Ratios: 8 % — ABNORMAL LOW (ref 17.9–39.5)
TIBC: 330 ug/dL (ref 250–450)
UIBC: 303 ug/dL

## 2018-11-19 LAB — FERRITIN: Ferritin: 249 ng/mL (ref 24–336)

## 2018-11-19 LAB — PHOSPHORUS: Phosphorus: 5.1 mg/dL — ABNORMAL HIGH (ref 2.5–4.6)

## 2018-11-19 LAB — MAGNESIUM: Magnesium: 2.9 mg/dL — ABNORMAL HIGH (ref 1.7–2.4)

## 2018-11-19 LAB — VITAMIN B12: Vitamin B-12: 1016 pg/mL — ABNORMAL HIGH (ref 180–914)

## 2018-11-19 LAB — FOLATE: Folate: 62.4 ng/mL (ref >5.9)

## 2018-11-19 MED ORDER — EPOETIN ALFA-EPBX 10000 UNIT/ML IJ SOLN
INTRAMUSCULAR | Status: AC
  Filled 2018-11-19: qty 2

## 2018-11-19 MED ORDER — EPOETIN ALFA-EPBX 10000 UNIT/ML IJ SOLN
20000.0000 [IU] | INTRAMUSCULAR | Status: DC
  Administered 2018-11-19: 20000 [IU] via SUBCUTANEOUS

## 2018-11-20 ENCOUNTER — Encounter (HOSPITAL_COMMUNITY): Payer: Self-pay

## 2018-11-20 ENCOUNTER — Ambulatory Visit (HOSPITAL_COMMUNITY)
Admission: RE | Admit: 2018-11-20 | Discharge: 2018-11-20 | Disposition: A | Discharge status: Home / Self Care | Payer: Medicare Other | Source: Ambulatory Visit | Attending: Nephrology | Admitting: Nephrology

## 2018-11-20 DIAGNOSIS — I340 Nonrheumatic mitral (valve) insufficiency: Secondary | ICD-10-CM | POA: Insufficient documentation

## 2018-11-20 DIAGNOSIS — N185 Chronic kidney disease, stage 5: Secondary | ICD-10-CM | POA: Diagnosis not present

## 2018-11-20 DIAGNOSIS — Z823 Family history of stroke: Secondary | ICD-10-CM | POA: Insufficient documentation

## 2018-11-20 DIAGNOSIS — E785 Hyperlipidemia, unspecified: Secondary | ICD-10-CM | POA: Diagnosis not present

## 2018-11-20 DIAGNOSIS — I25810 Atherosclerosis of coronary artery bypass graft(s) without angina pectoris: Secondary | ICD-10-CM | POA: Insufficient documentation

## 2018-11-20 DIAGNOSIS — Z833 Family history of diabetes mellitus: Secondary | ICD-10-CM | POA: Diagnosis not present

## 2018-11-20 DIAGNOSIS — J45909 Unspecified asthma, uncomplicated: Secondary | ICD-10-CM | POA: Diagnosis not present

## 2018-11-20 DIAGNOSIS — M19042 Primary osteoarthritis, left hand: Secondary | ICD-10-CM | POA: Diagnosis not present

## 2018-11-20 DIAGNOSIS — Z8249 Family history of ischemic heart disease and other diseases of the circulatory system: Secondary | ICD-10-CM | POA: Diagnosis not present

## 2018-11-20 DIAGNOSIS — D631 Anemia in chronic kidney disease: Secondary | ICD-10-CM | POA: Insufficient documentation

## 2018-11-20 DIAGNOSIS — E1165 Type 2 diabetes mellitus with hyperglycemia: Secondary | ICD-10-CM | POA: Diagnosis not present

## 2018-11-20 DIAGNOSIS — Z6829 Body mass index (BMI) 29.0-29.9, adult: Secondary | ICD-10-CM | POA: Diagnosis not present

## 2018-11-20 DIAGNOSIS — I130 Hypertensive heart and chronic kidney disease with heart failure and stage 1 through stage 4 chronic kidney disease, or unspecified chronic kidney disease: Secondary | ICD-10-CM | POA: Diagnosis not present

## 2018-11-20 DIAGNOSIS — E1122 Type 2 diabetes mellitus with diabetic chronic kidney disease: Secondary | ICD-10-CM | POA: Diagnosis not present

## 2018-11-20 DIAGNOSIS — I252 Old myocardial infarction: Secondary | ICD-10-CM | POA: Diagnosis not present

## 2018-11-20 DIAGNOSIS — N179 Acute kidney failure, unspecified: Secondary | ICD-10-CM | POA: Diagnosis not present

## 2018-11-20 DIAGNOSIS — E1151 Type 2 diabetes mellitus with diabetic peripheral angiopathy without gangrene: Secondary | ICD-10-CM | POA: Diagnosis not present

## 2018-11-20 DIAGNOSIS — Z800 Family history of malignant neoplasm of digestive organs: Secondary | ICD-10-CM | POA: Insufficient documentation

## 2018-11-20 DIAGNOSIS — G4733 Obstructive sleep apnea (adult) (pediatric): Secondary | ICD-10-CM | POA: Insufficient documentation

## 2018-11-20 DIAGNOSIS — Z7982 Long term (current) use of aspirin: Secondary | ICD-10-CM | POA: Insufficient documentation

## 2018-11-20 DIAGNOSIS — Z87891 Personal history of nicotine dependence: Secondary | ICD-10-CM | POA: Insufficient documentation

## 2018-11-20 DIAGNOSIS — Z794 Long term (current) use of insulin: Secondary | ICD-10-CM | POA: Diagnosis not present

## 2018-11-20 DIAGNOSIS — I472 Ventricular tachycardia: Secondary | ICD-10-CM | POA: Insufficient documentation

## 2018-11-20 DIAGNOSIS — Z951 Presence of aortocoronary bypass graft: Secondary | ICD-10-CM | POA: Insufficient documentation

## 2018-11-20 DIAGNOSIS — Z79899 Other long term (current) drug therapy: Secondary | ICD-10-CM | POA: Insufficient documentation

## 2018-11-20 DIAGNOSIS — I5042 Chronic combined systolic (congestive) and diastolic (congestive) heart failure: Secondary | ICD-10-CM | POA: Diagnosis not present

## 2018-11-20 DIAGNOSIS — I129 Hypertensive chronic kidney disease with stage 1 through stage 4 chronic kidney disease, or unspecified chronic kidney disease: Secondary | ICD-10-CM | POA: Diagnosis present

## 2018-11-20 DIAGNOSIS — N289 Disorder of kidney and ureter, unspecified: Secondary | ICD-10-CM | POA: Diagnosis not present

## 2018-11-20 LAB — PROTIME-INR
INR: 1.1 (ref 0.8–1.2)
Prothrombin Time: 14.4 seconds (ref 11.4–15.2)

## 2018-11-20 LAB — GLUCOSE, CAPILLARY: Glucose-Capillary: 111 mg/dL — ABNORMAL HIGH (ref 70–99)

## 2018-11-20 MED ORDER — HYDROCODONE-ACETAMINOPHEN 5-325 MG PO TABS: 1.0000 | ORAL_TABLET | ORAL | Status: DC | PRN

## 2018-11-20 MED ORDER — GELATIN ABSORBABLE 12-7 MM EX MISC
CUTANEOUS | Status: AC
  Filled 2018-11-20: qty 1

## 2018-11-20 MED ORDER — MIDAZOLAM HCL 2 MG/2ML IJ SOLN
INTRAMUSCULAR | Status: AC
  Filled 2018-11-20: qty 2

## 2018-11-20 MED ORDER — FENTANYL CITRATE (PF) 100 MCG/2ML IJ SOLN
INTRAMUSCULAR | Status: AC
  Filled 2018-11-20: qty 2

## 2018-11-20 MED ORDER — FENTANYL CITRATE (PF) 100 MCG/2ML IJ SOLN
INTRAMUSCULAR | Status: AC | PRN
  Administered 2018-11-20: 50 ug via INTRAVENOUS

## 2018-11-20 MED ORDER — LIDOCAINE HCL (PF) 1 % IJ SOLN
INTRAMUSCULAR | Status: AC
  Filled 2018-11-20: qty 30

## 2018-11-20 MED ORDER — SODIUM CHLORIDE 0.9 % IV SOLN: INTRAVENOUS | Status: DC

## 2018-11-20 MED ORDER — MIDAZOLAM HCL 2 MG/2ML IJ SOLN
INTRAMUSCULAR | Status: AC | PRN
  Administered 2018-11-20: 1 mg via INTRAVENOUS

## 2018-11-20 NOTE — Procedures (Signed)
  Procedure: [place] core RLP renal   EBL:   minimal Complications:  none immediate  See full dictation in [employer].  Dillard Cannon MD Main # [phone] Pager  [phone]

## 2018-11-20 NOTE — Discharge Instructions (Addendum)

## 2018-11-20 NOTE — H&P (Signed)
Chief Complaint: Patient was seen in consultation today for renal failure  Referring Physician(s): Patel,Jay  Supervising Physician: Arne Cleveland  Patient Status: [facility] - Out-pt  History of Present Illness: Henry Carroll is a 68 y.o. 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  Patient now presents for kidney biopsy to rule out myeloma kidney at the request of Dr. Posey Pronto and Dr. Lamonte Richer.   Henry Carroll presents today in his usual state of health.  He denies fever, chills, abdominal pain, nausea, cough, shortness of breath.  He has been NPO. He does not take blood thinners.   Past Medical History:  Diagnosis Date   Abnormal stress test 02/10/2015   Acute on chronic systolic and diastolic heart failure, NYHA class 1 ([place]) 12/31/2014   Anemia    Anemia in chronic kidney disease 09/03/2012   Arthritis    left  5th finger   Asthma    Asthma, chronic 06/04/2012   Asthma, chronic, mild intermittent, with acute exacerbation 12/22/2015   Bilateral lower extremity edema 12/22/2015   CAD (coronary artery disease) 02/18/2015   Chronic combined systolic (congestive) and diastolic (congestive) heart failure ([place])    a. 12/31/14: 2D ECHO: EF 40-45%, HK of inf myocardium, G1DD, mod MR   Chronic combined systolic and diastolic CHF (congestive heart failure) ([place]) 95/62/1308   Chronic systolic heart failure ([place]) 02/25/2015   CKD (chronic kidney disease), stage III    stage 3 kidney disease   CKD (chronic kidney disease), stage V ([place]) 08/30/2018   COLONIC POLYPS, HX OF 04/13/2009   Qualifier: Diagnosis of  By: Westly Pam.    Coronary artery disease    a. LHC 01/2015 - triple vessel CAD (mod oLM, mLAD, severe mRCA, intermediate branch stenosis, CTO of mCx). Plan CABG 02/2015.   Demand ischemia ([place]) 12/31/2014   DM type 2, uncontrolled, with renal complications ([place]) 6/57/8469     Elevated troponin    Essential hypertension 08/11/5282   Folliculitis of perineum 10/01/2012   Gastric erosion    GERD (gastroesophageal reflux disease)    Hyperlipidemia    Hyperlipidemia with target LDL less than 100 12/31/2014   Hypertension    Hypertensive heart disease with heart failure ([place]) 02/25/2015   IDA (iron deficiency anemia) 01/23/2016   Melena 01/23/2016   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   Mitral regurgitation    a. Mild-mod by echo 12/2014.   Morbid obesity ([place]) 12/29/2014   Myocardial infarction [facility])    pt. states per [name] he has in the past   NSVT (nonsustained ventricular tachycardia) ([place])    a. 9 beats during 01/2015 adm. BB titrated.   Obesity    a. BMI 33   Obesity (BMI 30-39.9) 05/05/2013   OSA (obstructive sleep apnea) 01/16/2018   Mild obstructive sleep apnea overall with an AHI of 7.3/h and no significant central sleep apnea. Severe obstructive sleep apnea during REM sleep with an AHI of 34.3/h.  Now on CPAP at 6 cm H2O.    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 [name]:  Ileocecal valve appeared normal, scattered pancolonic diverticulosis, difficult bowel prep making smaller lesions potentially missed. Recommended three-year follow-up colonoscopy.   COLONOSCOPY  2003   Dr. [name]: Suspicious lesion at the ileocecal valve, multiple biopsies benign, pancolonic diverticulosis.   COLONOSCOPY N/A 02/15/2016   diverticulosis in sigmoid and descending colon, single 5 mm polyp at splenic flexure (Tubular adenoma)   CORONARY ARTERY BYPASS GRAFT N/A 02/18/2015   Procedure: CORONARY ARTERY BYPASS GRAFTING (CABG) x  four, using bilateral internal mammary arteries and right leg greater saphenous vein harvested endoscopically;  Surgeon: [name], MD;  Location: [facility];  Service: Open Heart Surgery;  Laterality: N/A;   ESOPHAGOGASTRODUODENOSCOPY N/A 02/15/2016   normal   EYE SURGERY Right    July 2019    GIVENS CAPSULE STUDY N/A 01/08/2017   couple of gastric and small bowel eroions in setting of aspirin 81 mg daily but nothing concerning, continue Hematology follow-up   HERNIA REPAIR  8242   Umbilical   LUMBAR [place]  2006   L4 & L5   REFRACTIVE SURGERY Left    2019    REFRACTIVE SURGERY Right    2019   TEE WITHOUT CARDIOVERSION N/A 02/18/2015   Procedure: TRANSESOPHAGEAL ECHOCARDIOGRAM (TEE);  Surgeon: [name], MD;  Location: [facility];  Service: Open Heart Surgery;  Laterality: N/A;   TONSILLECTOMY  1962    Allergies: Patient has no known allergies.  Medications: Prior to Admission medications   Medication Sig Start Date End Date Taking? Authorizing Provider  acetaminophen (TYLENOL) 500 MG tablet Take 1,000 mg by mouth 2 (two) times daily as needed for moderate pain.    [provider]  albuterol (PROVENTIL) (2.5 MG/3ML) 0.083% nebulizer solution USE ONE VIAL IN NEBULIZER EVERY 6 HOURS AS NEEDED FOR WHEEZING FOR SHORTNESS OF BREATH Patient taking differently: Take 2.5 mg by nebulization every 4 (four) hours as needed for shortness of  breath.  08/04/18   [name], NP  amLODipine (NORVASC) 10 MG tablet Take 1 tablet (10 mg total) by mouth daily. 06/23/18   [name], NP  aspirin EC 81 MG tablet Take 1 tablet (81 mg total) by mouth daily. 01/24/16   Burtis Junes, NP  atorvastatin (LIPITOR) 40 MG tablet TAKE 1 TABLET BY MOUTH ONCE DAILY IN THE EVENING Patient taking differently: Take 40 mg by mouth daily.  09/10/18   Burtis Junes, NP  carvedilol (COREG) 25 MG tablet Take 25 mg by mouth 2 (two) times daily with a meal.    [provider]  Cholecalciferol (VITAMIN D3) 50 MCG (2000 UT) TABS Take 4,000 Units by mouth daily.    [provider]  Cinnamon 500 MG capsule Take 2,000 mg by mouth daily.     [provider]  Coenzyme Q10 (CO Q 10 PO) Take 200 mg by mouth daily.     [provider]  epoetin alfa-epbx (RETACRIT) 35361 UNIT/ML  injection 20,000 Units every 14 (fourteen) days.     Edrick Oh, MD  fluticasone [facility]) 50 MCG/ACT nasal spray Place 2 sprays into both nostrils daily as needed for allergies or rhinitis (SEASONAL ALLERGIES).    [provider]  Fluticasone-Salmeterol (ADVAIR) 500-50 MCG/DOSE AEPB Inhale 1 puff into the lungs 2 (two) times daily as needed (respiratory issues.).    [provider]  furosemide (LASIX) 40 MG tablet Take 80 mg by mouth 2 (two) times daily.    [provider]  glucose blood (ONE TOUCH ULTRA TEST) test strip Use to test blood sugar three times daily. Dx: E11.21 12/04/17   Gildardo Cranker, DO  hydrALAZINE (APRESOLINE) 100 MG tablet Take 100 mg by mouth 2 (two) times daily. 06/21/18   [provider]  HYDROcodone-acetaminophen (NORCO) 5-325 MG tablet Take 1 tablet by mouth every 6 (six) hours as needed for moderate pain. 11/14/18   Dagoberto Ligas, PA-C  insulin detemir (LEVEMIR) 100 UNIT/ML injection Inject 30 Units into the skin every other day. In the morning    [provider]  isosorbide  mononitrate (IMDUR) 30 MG 24 hr tablet Take 1 tablet (30 mg total) by mouth daily. 04/02/18 11/11/19  Burtis Junes, NP  metolazone (ZAROXOLYN) 5 MG tablet Take 5 mg by mouth 2 (two) times a week. Tuesday and Saturday only     [provider]  montelukast (SINGULAIR) 10 MG tablet Take 1 tablet (10 mg total) by mouth at bedtime. 06/23/18   [name], NP  Multiple Vitamin (MULTIVITAMIN WITH MINERALS) TABS tablet Take 1 tablet by mouth daily.    [provider]  nitroGLYCERIN (NITROSTAT) 0.4 MG SL tablet Place 1 tablet (0.4 mg total) under the tongue every 5 (five) minutes as needed for chest pain. 05/22/16 11/26/48  Burtis Junes, NP  PROAIR HFA 108 (90 Base) MCG/ACT inhaler INHALE 2 PUFFS INTO LUNGS EVERY 6 HOURS AS NEEDED FOR SHORTNESS OF BREATH Patient taking differently: Inhale 2 puffs into the lungs every 6 (six) hours as needed for shortness of breath.  10/15/18   [name], NP     Family History  Problem Relation Age of Onset   Diabetes Mother    Heart disease Mother        later in life, age >10   Heart disease Father        heart failure later in life   Hypertension Father    Stroke Sister    Kidney disease Daughter    Sudden death Daughter    Colon cancer Paternal Uncle        Passed from colon CA   Heart attack Neg Hx     Social History   Socioeconomic History   Marital status: Married    Spouse name: Not on file   Number of children: Not on file   Years of education: Not on file   Highest education level: Not on file  Occupational History   Not on file  Social Needs   Financial resource strain: Not hard at all   Food insecurity    Worry: Never true    Inability: Never true   Transportation needs    Medical: No    Non-medical: No  Tobacco Use   Smoking status: Former Smoker    Years: 17.00    Types: Cigars    Quit date: 12/31/2014    Years since quitting: 3.8   Smokeless tobacco: Never Used  Substance  and Sexual Activity  Alcohol use: Not Currently    Alcohol/week: 0.0 standard drinks    Comment: occasional glass of wine.   Drug use: No   Sexual activity: Yes    Birth control/protection: None  Lifestyle   Physical activity    Days per week: 5 days    Minutes per session: 30 min   Stress: Only a little  Relationships   Social connections    Talks on phone: More than three times a week    Gets together: More than three times a week    Attends religious service: More than 4 times per year    Active member of club or organization: Yes    Attends meetings of clubs or organizations: 1 to 4 times per year    Relationship status: Married  Other Topics Concern   Not on file  Social History Narrative   Not on file     Review of Systems: A 12 point ROS discussed and pertinent positives are indicated in the HPI above.  All other systems are negative.  Review of Systems  Constitutional: Negative for fatigue and fever.  Respiratory: Negative for cough and shortness of breath.   Cardiovascular: Negative for chest pain.  Gastrointestinal: Negative for abdominal pain, nausea and vomiting.  Psychiatric/Behavioral: Negative for behavioral problems and confusion.    Vital Signs: BP (!) 145/75    Pulse 61    Temp 97.8 F (36.6 C) (Oral)    Resp 16    Ht '6\' 2"'$  (1.88 m)    Wt 210 lb (95.3 kg)    SpO2 100%    BMI 26.96 kg/m   Physical Exam Vitals signs and nursing note reviewed.  Constitutional:      Appearance: Normal appearance.  HENT:     Mouth/Throat:     Mouth: Mucous membranes are moist.     Pharynx: Oropharynx is clear.  Cardiovascular:     Rate and Rhythm: Normal rate and regular rhythm.  Pulmonary:     Effort: Pulmonary effort is normal. No respiratory distress.     Breath sounds: Normal breath sounds.  Abdominal:     General: Abdomen is flat.     Palpations: Abdomen is soft.  Skin:    General: Skin is warm and dry.  Neurological:     General: No focal  deficit present.     Mental Status: He is alert and oriented to person, place, and time. Mental status is at baseline.  Psychiatric:        Mood and Affect: Mood normal.        Thought Content: Thought content normal.        Judgment: Judgment normal.      MD Evaluation Airway: WNL Heart: WNL Abdomen: WNL Chest/ Lungs: WNL ASA  Classification: 3 Mallampati/Airway Score: Two   Imaging: Dg Chest 2 View  Result Date: 10/23/2018 CLINICAL DATA:  Shortness of breath. EXAM: CHEST - 2 VIEW COMPARISON:  August 29, 2018 FINDINGS: Postsurgical changes of CABG. Mildly enlarged cardiac silhouette. Mild interstitial pulmonary edema. Bilateral lower lung fields peribronchial airspace consolidation cannot be excluded. Osseous structures are without acute abnormality. Soft tissues are grossly normal. IMPRESSION: Mild interstitial pulmonary edema. Bilateral lower lung fields peribronchial airspace consolidation cannot be excluded. Electronically Signed   By: Fidela Salisbury M.D.   On: 10/23/2018 16:43   Vas [place] Upper Extremity Arterial Duplex  Result Date: 11/03/2018 UPPER EXTREMITY DUPLEX STUDY Indications: Patient complains of HD access.  Performing Technologist: Ralene Cork RVT  Examination  Guidelines: A complete evaluation includes B-mode imaging, spectral Doppler, color Doppler, and power Doppler as needed of all accessible portions of each vessel. Bilateral testing is considered an integral part of a complete examination. Limited examinations for reoccurring indications may be performed as noted.  Right Pre-Dialysis Findings: +------------------+---------+-------------------+---------+-------------------+  Location           PSV       Intralum. Diam.     Waveform  Comments                                 (cm/s)    (cm)                                               +------------------+---------+-------------------+---------+-------------------+  Brachial Antecub.  89        0.60                 triphasic bifurcates at the     fossa                                                      ACF                  +------------------+---------+-------------------+---------+-------------------+  Radial Art at      86        0.28                triphasic                       Wrist                                                                           +------------------+---------+-------------------+---------+-------------------+  Ulnar Art at Wrist 74        0.26                triphasic                      +------------------+---------+-------------------+---------+-------------------+ Left Pre-Dialysis Findings: +------------------+---------+-------------------+---------+-------------------+  Location           PSV       Intralum. Diam.     Waveform  Comments                                 (cm/s)    (cm)                                               +------------------+---------+-------------------+---------+-------------------+  Brachial Antecub.  99/98     0.47/0.49           triphasic bifurcates in the     fossa  axilla               +------------------+---------+-------------------+---------+-------------------+  Radial Art at      86        0.29                triphasic                       Wrist                                                                           +------------------+---------+-------------------+---------+-------------------+  Ulnar Art at Wrist 102       0.22                triphasic                      +------------------+---------+-------------------+---------+-------------------+  Summary:  Right: Patent brachial, radial, and ulnar arteries. Left: Patent brachial, radial, and ulnar arteries. High bifurcation.       Brachial measurements are ulnar/radial. *See table(s) above for measurements and observations. Electronically signed by Harold Barban MD on 11/03/2018 at 2:42:03 PM.    Final    Vas [place] Upper Extremity Vein  Mapping  Result Date: 11/03/2018 UPPER EXTREMITY VEIN MAPPING  Indications: Pre-access. Performing Technologist: Ralene Cork RVT  Examination Guidelines: A complete evaluation includes B-mode imaging, spectral Doppler, color Doppler, and power Doppler as needed of all accessible portions of each vessel. Bilateral testing is considered an integral part of a complete examination. Limited examinations for reoccurring indications may be performed as noted. +-----------------+-------------+----------+--------------+  Right Cephalic    Diameter (cm) Depth (cm)    Findings     +-----------------+-------------+----------+--------------+  Shoulder                                   not visualized  +-----------------+-------------+----------+--------------+  Prox upper arm        0.17                                 +-----------------+-------------+----------+--------------+  Mid upper arm         0.16                                 +-----------------+-------------+----------+--------------+  Dist upper arm        0.16                                 +-----------------+-------------+----------+--------------+  Antecubital fossa     0.29                                 +-----------------+-------------+----------+--------------+  Prox forearm          0.31                                 +-----------------+-------------+----------+--------------+  Mid forearm           0.18                                 +-----------------+-------------+----------+--------------+  Dist forearm          0.17                                 +-----------------+-------------+----------+--------------+ +-----------------+-------------+----------+--------+  Right Basilic     Diameter (cm) Depth (cm) Findings  +-----------------+-------------+----------+--------+  Mid upper arm         0.26                           +-----------------+-------------+----------+--------+  Dist upper arm        0.26                            +-----------------+-------------+----------+--------+  Antecubital fossa     0.24                           +-----------------+-------------+----------+--------+ +-----------------+-------------+----------+------------+  Left Cephalic     Diameter (cm) Depth (cm)   Findings    +-----------------+-------------+----------+------------+  Shoulder              0.18                               +-----------------+-------------+----------+------------+  Prox upper arm        0.14                               +-----------------+-------------+----------+------------+  Mid upper arm         0.16                               +-----------------+-------------+----------+------------+  Dist upper arm        0.07                               +-----------------+-------------+----------+------------+  Antecubital fossa     0.51                               +-----------------+-------------+----------+------------+  Prox forearm          0.27                               +-----------------+-------------+----------+------------+  Mid forearm           0.23                               +-----------------+-------------+----------+------------+  Dist forearm          0.28                 back of hand  +-----------------+-------------+----------+------------+ +-----------------+-------------+----------+--------+  Left Basilic      Diameter (cm) Depth (cm) Findings  +-----------------+-------------+----------+--------+  Mid upper arm  0.31                           +-----------------+-------------+----------+--------+  Dist upper arm        0.27                           +-----------------+-------------+----------+--------+  Antecubital fossa     0.23                           +-----------------+-------------+----------+--------+ Summary: Right: Patent cephalic and basilic veins. Left: Patent cephalic and basilic veins. *See table(s) above for measurements and observations.  Diagnosing physician: Harold Barban MD  Electronically signed by Harold Barban MD on 11/03/2018 at 4:54:02 PM.    Final     Labs:  CBC: Recent Labs    10/08/18 1155  10/14/18 1430  10/23/18 1617 11/05/18 1234 11/14/18 0656 11/19/18 1119  WBC 4.4  --  4.4  --  5.4  --   --  4.8  HGB 8.6*   < > 9.3*   < > 9.2* 8.9* 9.5* 9.4*  HCT 26.9*  --  28.9*  --  29.3*  --  28.0* 29.7*  PLT 230  --  263  --  212  --   --  219   < > = values in this interval not displayed.    COAGS: Recent Labs    09/29/18 0940 11/20/18 0645  INR 1.0 1.1    BMP: Recent Labs    10/08/18 1155 10/14/18 1430 10/23/18 1617 11/14/18 0656 11/19/18 1119  NA 138 137 137 136 137  K 4.7 4.4 4.4 4.3 4.5  CL 102 102 105 98 97*  CO2 27 24 21*  --  27  GLUCOSE 160* 179* 186* 156* 100*  BUN 90* 92* 88* 116* 131*  CALCIUM 8.8* 8.9 9.0  --  9.2  CREATININE 6.32* 6.19* 6.08* 6.70* 6.68*  GFRNONAA 8* 8* 9*  --  8*  GFRAA 9* 10* 10*  --  9*    LIVER FUNCTION TESTS: Recent Labs    09/16/18 1523 09/23/18 1305 10/08/18 1155 10/14/18 1430 11/19/18 1119  BILITOT 0.5 0.6  --  0.2* 0.2*  AST 21 24  --  17 18  ALT 15 19  --  15 7  ALKPHOS 44 42  --  51 46  PROT 6.5 6.4*  --  6.4* 6.5  ALBUMIN 3.3* 3.3* 3.2* 3.4* 3.5    TUMOR MARKERS: No results for input(s): AFPTM, CEA, CA199, CHROMGRNA in the last 8760 hours.  Assessment and Plan: Patient with past medical history of HTN, DM, CKD presents with complaint of IgG lambda plasma cell disorder without signs of myeloma or plasmacytoma by imaging.  IR consulted for random renal biopsy at the request of Dr. Posey Pronto and Dr. Delton Coombes. Case reviewed by Dr. Vernard Gambles who approves patient for procedure.  Patient presents today in their usual state of health.  He has been NPO and is not currently on blood thinners.    Risks and benefits of biopsy was discussed with the patient and/or patient's family including, but not limited to bleeding, infection, damage to adjacent structures or low yield requiring  additional tests.  All of the questions were answered and there is agreement to proceed.  Consent signed and in chart.  Thank you for this interesting consult.  I greatly enjoyed meeting Henry Carroll  and look forward to participating in their care.  A copy of this report was sent to the requesting provider on this date.  Electronically Signed: Docia Barrier, PA 11/20/2018, 7:42 AM   I spent a total of    25 Minutes in face to face in clinical consultation, greater than 50% of which was counseling/coordinating care for chronic kidney disease, IgG lambda plasma cell disorder.

## 2018-11-24 DIAGNOSIS — D649 Anemia, unspecified: Secondary | ICD-10-CM | POA: Diagnosis not present

## 2018-11-24 DIAGNOSIS — N2581 Secondary hyperparathyroidism of renal origin: Secondary | ICD-10-CM | POA: Diagnosis not present

## 2018-11-24 DIAGNOSIS — N185 Chronic kidney disease, stage 5: Secondary | ICD-10-CM | POA: Diagnosis not present

## 2018-11-24 DIAGNOSIS — I120 Hypertensive chronic kidney disease with stage 5 chronic kidney disease or end stage renal disease: Secondary | ICD-10-CM | POA: Diagnosis not present

## 2018-11-25 ENCOUNTER — Other Ambulatory Visit: Payer: Self-pay

## 2018-11-25 ENCOUNTER — Encounter (HOSPITAL_COMMUNITY): Payer: Self-pay | Admitting: Hematology

## 2018-11-25 ENCOUNTER — Inpatient Hospital Stay (HOSPITAL_BASED_OUTPATIENT_CLINIC_OR_DEPARTMENT_OTHER): Payer: Medicare Other | Admitting: Hematology

## 2018-11-25 VITALS — BP 123/61 | HR 60 | Temp 97.9°F | Resp 16 | Wt 215.5 lb

## 2018-11-25 DIAGNOSIS — D472 Monoclonal gammopathy: Secondary | ICD-10-CM

## 2018-11-25 DIAGNOSIS — N183 Chronic kidney disease, stage 3 unspecified: Secondary | ICD-10-CM | POA: Diagnosis not present

## 2018-11-25 DIAGNOSIS — C9000 Multiple myeloma not having achieved remission: Secondary | ICD-10-CM

## 2018-11-25 DIAGNOSIS — D509 Iron deficiency anemia, unspecified: Secondary | ICD-10-CM | POA: Diagnosis not present

## 2018-11-25 NOTE — Patient Instructions (Addendum)
[facility] at [facility] Discharge Instructions  You were seen today by Dr. Katragadda. He went over your recent lab results. He will see you back in 2 months for labs and follow up.   Thank you for choosing [facility] at [facility] to provide your oncology and hematology care.  To afford each patient quality time with our provider, please arrive at least 15 minutes before your scheduled appointment time.   If you have a lab appointment with the Cancer Center please come in thru the  Main Entrance and check in at the main information desk  You need to re-schedule your appointment should you arrive 10 or more minutes late.  We strive to give you quality time with our providers, and arriving late affects you and other patients whose appointments are after yours.  Also, if you no show three or more times for appointments you may be dismissed from the clinic at the providers discretion.     Again, thank you for choosing [facility].  Our hope is that these requests will decrease the amount of time that you wait before being seen by our physicians.       _____________________________________________________________  Should you have questions after your visit to [facility], please contact our office at [phone] between the hours of 8:00 a.m. and 4:30 p.m.  Voicemails left after 4:00 p.m. will not be returned until the following business day.  For prescription refill requests, have your pharmacy contact our office and allow 72 hours.    Cancer Center Support Programs:   > Cancer Support Group  2nd Tuesday of the month 1pm-2pm, Journey Room    

## 2018-11-25 NOTE — Progress Notes (Signed)
Henry Carroll, [place] [address]   CLINIC:  Medical Oncology/Hematology  PCP:  Henry Chandler, NP [place] [address] [phone]   REASON FOR VISIT: Follow-up for iron deficiency anemia and MGUS  CURRENT THERAPY:  Intermittent iron therapy and Retacrit.   INTERVAL HISTORY:  Henry Carroll 68 y.o. male seen for follow-up of plasma cell disorder and anemia from chronic kidney disease and iron deficiency.  Denies any new onset bone pains.  He apparently underwent kidney biopsy on 11/20/2018.  He also underwent left fistula placement in preparation for dialysis.  His kidney function is continuing to get worse.  Appetite is 100%.  Energy levels are 75%.  Denies any fevers, night sweats or weight loss.  His urine output is stable.  REVIEW OF SYSTEMS:  Review of Systems  All other systems reviewed and are negative.    PAST MEDICAL/SURGICAL HISTORY:  Past Medical History:  Diagnosis Date  . Abnormal stress test 02/10/2015  . Acute on chronic systolic and diastolic heart failure, NYHA class 1 ([place]) 12/31/2014  . Anemia   . Anemia in chronic kidney disease 09/03/2012  . 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 Asthma   . Asthma, chronic 06/04/2012  . Asthma, chronic, mild intermittent, with acute exacerbation 12/22/2015  . Bilateral lower extremity edema 12/22/2015  . CAD (coronary artery disease) 02/18/2015  . Chronic combined systolic (congestive) and diastolic (congestive) heart failure ([place])    a. 12/31/14: 2D ECHO: EF 40-45%, HK of inf myocardium, G1DD, mod MR  . Chronic combined systolic and diastolic CHF (congestive heart failure) ([place]) 02/10/2015  . Chronic systolic heart failure ([place]) 02/25/2015  . CKD (chronic kidney disease), stage III    stage 3 kidney disease  . CKD (chronic kidney disease), stage V ([place]) 08/30/2018  . COLONIC POLYPS, HX OF 04/13/2009   Qualifier: Diagnosis of  By: Westly Pam.   . Coronary artery  disease    a. LHC 01/2015 - triple vessel CAD (mod oLM, mLAD, severe mRCA, intermediate branch stenosis, CTO of mCx). Plan CABG 02/2015.  Marland Kitchen Demand ischemia ([place]) 12/31/2014  . DM type 2, uncontrolled, with renal complications ([place]) 9/35/7017  . Elevated troponin   . Essential hypertension 10/09/2013  . Folliculitis of perineum 10/01/2012  . Gastric erosion   . GERD (gastroesophageal reflux disease)   . Hyperlipidemia   . Hyperlipidemia with target LDL less than 100 12/31/2014  . Hypertension   . Hypertensive heart disease with heart failure ([place]) 02/25/2015  . IDA (iron deficiency anemia) 01/23/2016  . Melena 01/23/2016  . 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 Mitral regurgitation    a. Mild-mod by echo 12/2014.  . Morbid obesity ([place]) 12/29/2014  . Myocardial infarction [facility])    pt. states per [name] he has in the past  . NSVT (nonsustained ventricular tachycardia) ([place])    a. 9 beats during 01/2015 adm. BB titrated.  . Obesity    a. BMI 33  . Obesity (BMI 30-39.9) 05/05/2013  . OSA (obstructive sleep apnea) 01/16/2018   Mild obstructive sleep apnea overall with an AHI of 7.3/h and no significant central sleep apnea. Severe obstructive sleep apnea during REM sleep with an AHI of 34.3/h.  Now on CPAP at 6 cm H2O.   . Other malaise and fatigue 09/03/2012  . 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 PAD (peripheral artery disease) ([place]) 03/02/2015  . Pneumonia   .  Sleep apnea    2019, Dr.Turner diagnoised   . Small bowel lesion   . Symptomatic anemia 08/29/2018  . 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 Type II diabetes mellitus ([place])   . Wears dentures    Past Surgical History:  Procedure Laterality Date  . BACK SURGERY    . BASCILIC VEIN TRANSPOSITION Left 11/14/2018   Procedure: FIRST STAGE BASILIC VEIN TRANSPOSITION LEFT ARM;  Surgeon: Serafina Mitchell, MD;  Location: [facility];  Service: Vascular;  Laterality: Left;  . BONE MARROW BIOPSY    .  CARDIAC CATHETERIZATION N/A 02/09/2015   Procedure: Left Heart Cath and Coronary Angiography;  Surgeon: Burnell Blanks, MD;  Location: [facility];  Service: Cardiovascular;  Laterality: N/A;  . COLONOSCOPY  2011   Dr. [name]: Ileocecal valve appeared normal, scattered pancolonic diverticulosis, difficult bowel prep making smaller lesions potentially missed. Recommended three-year follow-up colonoscopy.  . COLONOSCOPY  2003   Dr. [name]: Suspicious lesion at the ileocecal valve, multiple biopsies benign, pancolonic diverticulosis.  . COLONOSCOPY N/A 02/15/2016   diverticulosis in sigmoid and descending colon, single 5 mm polyp at splenic flexure (Tubular adenoma)  . CORONARY ARTERY BYPASS GRAFT N/A 02/18/2015   Procedure: CORONARY ARTERY BYPASS GRAFTING (CABG) x  four, using bilateral internal mammary arteries and right leg greater saphenous vein harvested endoscopically;  Surgeon: [name], MD;  Location: [facility];  Service: Open Heart Surgery;  Laterality: N/A;  . ESOPHAGOGASTRODUODENOSCOPY N/A 02/15/2016   normal  . EYE SURGERY Right    July 2019   . GIVENS CAPSULE STUDY N/A 01/08/2017   couple of gastric and small bowel eroions in setting of aspirin 81 mg daily but nothing concerning, continue Hematology follow-up  . HERNIA REPAIR  2633   Umbilical  . LUMBAR [place] SURGERY  2006   L4 & L5  . REFRACTIVE SURGERY Left    2019   . REFRACTIVE SURGERY Right    2019  . TEE WITHOUT CARDIOVERSION N/A 02/18/2015   Procedure: TRANSESOPHAGEAL ECHOCARDIOGRAM (TEE);  Surgeon: [name], MD;  Location: [facility];  Service: Open Heart Surgery;  Laterality: N/A;  . TONSILLECTOMY  1962     SOCIAL HISTORY:  Social History   Socioeconomic History  . Marital status: Married    Spouse name: Not on file  . Number of children: Not on file  . Years of education: Not on file  . Highest education level: Not on file  Occupational History  . Not on file  Social Needs  . Financial resource  strain: Not hard at all  . Food insecurity    Worry: Never true    Inability: Never true  . Transportation needs    Medical: No    Non-medical: No  Tobacco Use  . Smoking status: Former Smoker    Years: 17.00    Types: Cigars    Quit date: 12/31/2014    Years since quitting: 3.9  . Smokeless tobacco: Never Used  Substance and Sexual Activity  . Alcohol use: Not Currently    Alcohol/week: 0.0 standard drinks    Comment: occasional glass of wine.  . Drug use: No  . Sexual activity: Yes    Birth control/protection: None  Lifestyle  . Physical activity    Days per week: 5 days    Minutes per session: 30 min  . Stress: Only a little  Relationships  . Social connections    Talks on phone: More than three times a week  Gets together: More than three times a week    Attends religious service: More than 4 times per year    Active member of club or organization: Yes    Attends meetings of clubs or organizations: 1 to 4 times per year    Relationship status: Married  . Intimate partner violence    Fear of current or ex partner: No    Emotionally abused: No    Physically abused: No    Forced sexual activity: No  Other Topics Concern  . Not on file  Social History Narrative  . Not on file    FAMILY HISTORY:  Family History  Problem Relation Age of Onset  . Diabetes Mother   . Heart disease Mother        later in life, age >90  . Heart disease Father        heart failure later in life  . Hypertension Father   . Stroke Sister   . Kidney disease Daughter   . Sudden death Daughter   . Colon cancer Paternal Uncle        Passed from colon CA  . Heart attack Neg Hx     CURRENT MEDICATIONS:  Outpatient Encounter Medications as of 11/25/2018  Medication Sig Note  . acetaminophen (TYLENOL) 500 MG tablet Take 1,000 mg by mouth 2 (two) times daily as needed for moderate pain.   Marland Kitchen albuterol (PROVENTIL) (2.5 MG/3ML) 0.083% nebulizer solution USE ONE VIAL IN NEBULIZER EVERY 6  HOURS AS NEEDED FOR WHEEZING FOR SHORTNESS OF BREATH (Patient taking differently: Take 2.5 mg by nebulization every 4 (four) hours as needed for shortness of breath. )   . amLODipine (NORVASC) 10 MG tablet Take 1 tablet (10 mg total) by mouth daily.   Marland Kitchen aspirin EC 81 MG tablet Take 1 tablet (81 mg total) by mouth daily.   Marland Kitchen atorvastatin (LIPITOR) 40 MG tablet TAKE 1 TABLET BY MOUTH ONCE DAILY IN THE EVENING (Patient taking differently: Take 40 mg by mouth daily. )   . carvedilol (COREG) 25 MG tablet Take 25 mg by mouth 2 (two) times daily with a meal.   . Cholecalciferol (VITAMIN D3) 50 MCG (2000 UT) TABS Take 4,000 Units by mouth daily.   . Cinnamon 500 MG capsule Take 2,000 mg by mouth daily.    . Coenzyme Q10 (CO Q 10 PO) Take 200 mg by mouth daily.    Marland Kitchen epoetin alfa-epbx (RETACRIT) 63845 UNIT/ML injection 20,000 Units every 14 (fourteen) days.    . fluticasone (FLONASE) 50 MCG/ACT nasal spray Place 2 sprays into both nostrils daily as needed for allergies or rhinitis (SEASONAL ALLERGIES).   . Fluticasone-Salmeterol (ADVAIR) 500-50 MCG/DOSE AEPB Inhale 1 puff into the lungs 2 (two) times daily as needed (respiratory issues.).   Marland Kitchen furosemide (LASIX) 40 MG tablet Take 80 mg by mouth 2 (two) times daily.   Marland Kitchen glucose blood (ONE TOUCH ULTRA TEST) test strip Use to test blood sugar three times daily. Dx: E11.21   . hydrALAZINE (APRESOLINE) 100 MG tablet Take 100 mg by mouth 2 (two) times daily.   Marland Kitchen HYDROcodone-acetaminophen (NORCO) 5-325 MG tablet Take 1 tablet by mouth every 6 (six) hours as needed for moderate pain.   Marland Kitchen insulin detemir (LEVEMIR) 100 UNIT/ML injection Inject 30 Units into the skin every other day. In the morning 11/14/2018: Took 30 units yesterday in the am  . isosorbide mononitrate (IMDUR) 30 MG 24 hr tablet Take 1 tablet (30 mg total) by mouth  daily.   . metolazone (ZAROXOLYN) 5 MG tablet Take 5 mg by mouth 2 (two) times a week. Tuesday and Saturday only    . montelukast (SINGULAIR)  10 MG tablet Take 1 tablet (10 mg total) by mouth at bedtime.   . Multiple Vitamin (MULTIVITAMIN WITH MINERALS) TABS tablet Take 1 tablet by mouth daily.   . nitroGLYCERIN (NITROSTAT) 0.4 MG SL tablet Place 1 tablet (0.4 mg total) under the tongue every 5 (five) minutes as needed for chest pain.   Marland Kitchen PROAIR HFA 108 (90 Base) MCG/ACT inhaler INHALE 2 PUFFS INTO LUNGS EVERY 6 HOURS AS NEEDED FOR SHORTNESS OF BREATH (Patient taking differently: Inhale 2 puffs into the lungs every 6 (six) hours as needed for shortness of breath. )    Facility-Administered Encounter Medications as of 11/25/2018  Medication  . regadenoson (LEXISCAN) injection SOLN 0.4 mg    ALLERGIES:  No Known Allergies   PHYSICAL EXAM:  ECOG Performance status: 1  Vitals:   11/25/18 1100  BP: 123/61  Pulse: 60  Resp: 16  Temp: 97.9 F (36.6 C)  SpO2: 99%   Filed Weights   11/25/18 1100  Weight: 215 lb 8 oz (97.8 kg)    Physical Exam Constitutional:      Appearance: Normal appearance. He is normal weight.  Cardiovascular:     Rate and Rhythm: Normal rate and regular rhythm.     Heart sounds: Normal heart sounds.  Pulmonary:     Effort: Pulmonary effort is normal.     Breath sounds: Normal breath sounds.  Abdominal:     General: There is no distension.     Palpations: Abdomen is soft. There is no mass.  Musculoskeletal: Normal range of motion.     Right lower leg: Edema present.     Left lower leg: Edema present.  Skin:    General: Skin is warm and dry.  Neurological:     Mental Status: He is alert and oriented to person, place, and time. Mental status is at baseline.  Psychiatric:        Mood and Affect: Mood normal.        Behavior: Behavior normal.      LABORATORY DATA:  I have reviewed the labs as listed.  CBC    Component Value Date/Time   WBC 4.8 11/19/2018 1119   RBC 3.04 (L) 11/19/2018 1119   HGB 9.4 (L) 11/19/2018 1119   HGB 10.0 (L) 04/01/2018 1019   HCT 29.7 (L) 11/19/2018 1119    HCT 28.6 (L) 04/01/2018 1019   PLT 219 11/19/2018 1119   PLT 290 04/01/2018 1019   MCV 97.7 11/19/2018 1119   MCV 96 04/01/2018 1019   MCH 30.9 11/19/2018 1119   MCHC 31.6 11/19/2018 1119   RDW 13.4 11/19/2018 1119   RDW 12.2 04/01/2018 1019   LYMPHSABS 0.6 (L) 11/19/2018 1119   LYMPHSABS 1.1 11/15/2016 0815   MONOABS 0.5 11/19/2018 1119   EOSABS 0.4 11/19/2018 1119   EOSABS 0.2 11/15/2016 0815   BASOSABS 0.1 11/19/2018 1119   BASOSABS 0.0 11/15/2016 0815   CMP Latest Ref Rng & Units 11/19/2018 11/14/2018 10/23/2018  Glucose 70 - 99 mg/dL 100(H) 156(H) 186(H)  BUN 8 - 23 mg/dL 131(H) 116(H) 88(H)  Creatinine 0.61 - 1.24 mg/dL 6.68(H) 6.70(H) 6.08(H)  Sodium 135 - 145 mmol/L 137 136 137  Potassium 3.5 - 5.1 mmol/L 4.5 4.3 4.4  Chloride 98 - 111 mmol/L 97(L) 98 105  CO2 22 - 32 mmol/L 27 -  21(L)  Calcium 8.9 - 10.3 mg/dL 9.2 - 9.0  Total Protein 6.5 - 8.1 g/dL 6.5 - -  Total Bilirubin 0.3 - 1.2 mg/dL 0.2(L) - -  Alkaline Phos 38 - 126 U/L 46 - -  AST 15 - 41 U/L 18 - -  ALT 0 - 44 U/L 7 - -       DIAGNOSTIC IMAGING:  I have independently reviewed the scans and discussed with the patient.    ASSESSMENT & PLAN:   MGUS (monoclonal gammopathy of unknown significance) 1.  IgG lambda plasma cell disorder: -SPEP on 09/16/2018 shows M spike of 0.3 g/dL.  Lambda light chains are 145 (previously 71 on 02/10/2018), kappa light chains 160, ratio of 1.11. -Bone marrow biopsy done secondary to worsening kidney function showed hypercellular marrow with 10% lambda restricted plasma cells.  Chromosome analysis was 46, XY.  FISH panel was normal. -PET scan on 10/13/2018 did not show any evidence of myeloma. - He underwent kidney biopsy on 11/20/2018.  I do not have the final pathology.  Apparently Dr. Posey Pronto told him that his kidney dysfunction was from diabetes. - He had left arm fistula placed in preparation for dialysis in the future. - We will follow-up on the pathology report.  He was told  to come back in 2 months for repeat myeloma labs.  2.  Lower extremity edema: - He is continuing furosemide 80 mg twice daily.  3.  Normocytic anemia: - Etiology is from combination of CKD and relative iron deficiency. -Feraheme on 09/10/2018 and 09/17/2018. - Retacrit 20,000 units every 2 weeks, started at 10,000 units on 09/10/2018. - Last hemoglobin was 9.4 on 11/19/2018.  Ferritin was 249 and percent saturation was 8.  I will give him 1 more dose of Feraheme.  Total time spent is 25 minutes with more than 50% of the time spent face-to-face discussing treatment plan, counseling and coordination of care.    Orders placed this encounter:  Orders Placed This Encounter  Procedures  . CBC with Differential/Platelet  . Comprehensive metabolic panel  . Protein electrophoresis, serum  . Kappa/lambda light chains  . Lactate dehydrogenase      Derek Jack, MD [place] [phone]

## 2018-11-26 ENCOUNTER — Encounter (HOSPITAL_COMMUNITY): Payer: Self-pay | Admitting: Hematology

## 2018-11-26 NOTE — Assessment & Plan Note (Addendum)
1.  IgG lambda plasma cell disorder: -SPEP on 09/16/2018 shows M spike of 0.3 g/dL.  Lambda light chains are 145 (previously 71 on 02/10/2018), kappa light chains 160, ratio of 1.11. -Bone marrow biopsy done secondary to worsening kidney function showed hypercellular marrow with 10% lambda restricted plasma cells.  Chromosome analysis was 46, XY.  FISH panel was normal. -PET scan on 10/13/2018 did not show any evidence of myeloma. - He underwent kidney biopsy on 11/20/2018.  I do not have the final pathology.  Apparently Dr. Posey Pronto told him that his kidney dysfunction was from diabetes. - He had left arm fistula placed in preparation for dialysis in the future. - We will follow-up on the pathology report.  He was told to come back in 2 months for repeat myeloma labs.  2.  Lower extremity edema: - He is continuing furosemide 80 mg twice daily.  3.  Normocytic anemia: - Etiology is from combination of CKD and relative iron deficiency. -Feraheme on 09/10/2018 and 09/17/2018. - Retacrit 20,000 units every 2 weeks, started at 10,000 units on 09/10/2018. - Last hemoglobin was 9.4 on 11/19/2018.  Ferritin was 249 and percent saturation was 8.  I will give him 1 more dose of Feraheme.

## 2018-12-02 ENCOUNTER — Other Ambulatory Visit: Payer: Self-pay

## 2018-12-02 ENCOUNTER — Encounter (HOSPITAL_COMMUNITY): Payer: Self-pay | Admitting: Nephrology

## 2018-12-02 LAB — SURGICAL PATHOLOGY

## 2018-12-03 ENCOUNTER — Encounter (HOSPITAL_COMMUNITY)
Admission: RE | Admit: 2018-12-03 | Discharge: 2018-12-03 | Disposition: A | Discharge status: Home / Self Care | Payer: Medicare Other | Source: Ambulatory Visit | Attending: Nephrology | Admitting: Nephrology

## 2018-12-03 ENCOUNTER — Inpatient Hospital Stay (HOSPITAL_COMMUNITY): Payer: Medicare Other

## 2018-12-03 ENCOUNTER — Encounter (HOSPITAL_COMMUNITY): Payer: Self-pay

## 2018-12-03 VITALS — BP 150/64 | HR 68 | Temp 97.1°F | Resp 18

## 2018-12-03 DIAGNOSIS — D509 Iron deficiency anemia, unspecified: Secondary | ICD-10-CM | POA: Diagnosis not present

## 2018-12-03 DIAGNOSIS — D472 Monoclonal gammopathy: Secondary | ICD-10-CM | POA: Diagnosis not present

## 2018-12-03 DIAGNOSIS — N184 Chronic kidney disease, stage 4 (severe): Secondary | ICD-10-CM | POA: Diagnosis not present

## 2018-12-03 DIAGNOSIS — N183 Chronic kidney disease, stage 3 unspecified: Secondary | ICD-10-CM | POA: Diagnosis not present

## 2018-12-03 LAB — POCT HEMOGLOBIN-HEMACUE: Hemoglobin: 8.7 g/dL — ABNORMAL LOW (ref 13.0–17.0)

## 2018-12-03 MED ORDER — SODIUM CHLORIDE 0.9 % IV SOLN
Freq: Once | INTRAVENOUS | Status: AC
  Administered 2018-12-03: 14:00:00 via INTRAVENOUS

## 2018-12-03 MED ORDER — EPOETIN ALFA-EPBX 10000 UNIT/ML IJ SOLN
INTRAMUSCULAR | Status: AC
  Filled 2018-12-03: qty 2

## 2018-12-03 MED ORDER — EPOETIN ALFA-EPBX 10000 UNIT/ML IJ SOLN
20000.0000 [IU] | Freq: Once | INTRAMUSCULAR | Status: AC
  Administered 2018-12-03: 20000 [IU] via SUBCUTANEOUS

## 2018-12-03 MED ORDER — SODIUM CHLORIDE 0.9 % IV SOLN
510.0000 mg | Freq: Once | INTRAVENOUS | Status: AC
  Administered 2018-12-03: 510 mg via INTRAVENOUS
  Filled 2018-12-03: qty 510

## 2018-12-03 NOTE — Progress Notes (Signed)
Patient tolerated iron infusion with no complaints voiced.  Peripheral IV site clean and dry with good blood return noted before and after infusion.  Band aid applied.  VSS with discharge and left ambulatory with no s/s of distress noted.  

## 2018-12-17 ENCOUNTER — Other Ambulatory Visit: Payer: Self-pay

## 2018-12-17 ENCOUNTER — Encounter (HOSPITAL_COMMUNITY)
Admission: RE | Admit: 2018-12-17 | Discharge: 2018-12-17 | Disposition: A | Discharge status: Home / Self Care | Payer: Medicare Other | Source: Ambulatory Visit | Attending: Nephrology | Admitting: Nephrology

## 2018-12-17 ENCOUNTER — Encounter (HOSPITAL_COMMUNITY): Payer: Self-pay

## 2018-12-17 DIAGNOSIS — N184 Chronic kidney disease, stage 4 (severe): Secondary | ICD-10-CM | POA: Insufficient documentation

## 2018-12-17 DIAGNOSIS — D631 Anemia in chronic kidney disease: Secondary | ICD-10-CM | POA: Insufficient documentation

## 2018-12-17 LAB — CBC WITH DIFFERENTIAL/PLATELET
Abs Immature Granulocytes: 0.01 10*3/uL (ref 0.00–0.07)
Basophils Absolute: 0 10*3/uL (ref 0.0–0.1)
Basophils Relative: 0 %
Eosinophils Absolute: 0.2 10*3/uL (ref 0.0–0.5)
Eosinophils Relative: 3 %
HCT: 28.6 % — ABNORMAL LOW (ref 39.0–52.0)
Hemoglobin: 9.1 g/dL — ABNORMAL LOW (ref 13.0–17.0)
Immature Granulocytes: 0 %
Lymphocytes Relative: 14 %
Lymphs Abs: 0.6 10*3/uL — ABNORMAL LOW (ref 0.7–4.0)
MCH: 31 pg (ref 26.0–34.0)
MCHC: 31.8 g/dL (ref 30.0–36.0)
MCV: 97.3 fL (ref 80.0–100.0)
Monocytes Absolute: 0.5 10*3/uL (ref 0.1–1.0)
Monocytes Relative: 12 %
Neutro Abs: 3.3 10*3/uL (ref 1.7–7.7)
Neutrophils Relative %: 71 %
Platelets: 186 10*3/uL (ref 150–400)
RBC: 2.94 MIL/uL — ABNORMAL LOW (ref 4.22–5.81)
RDW: 14.2 % (ref 11.5–15.5)
WBC: 4.7 10*3/uL (ref 4.0–10.5)
nRBC: 0 % (ref 0.0–0.2)

## 2018-12-17 LAB — RENAL FUNCTION PANEL
Albumin: 3.6 g/dL (ref 3.5–5.0)
Anion gap: 14 (ref 5–15)
BUN: 137 mg/dL — ABNORMAL HIGH (ref 8–23)
CO2: 23 mmol/L (ref 22–32)
Calcium: 8.9 mg/dL (ref 8.9–10.3)
Chloride: 102 mmol/L (ref 98–111)
Creatinine, Ser: 7.07 mg/dL — ABNORMAL HIGH (ref 0.61–1.24)
GFR calc Af Amer: 8 mL/min — ABNORMAL LOW (ref >60)
GFR calc non Af Amer: 7 mL/min — ABNORMAL LOW (ref >60)
Glucose, Bld: 150 mg/dL — ABNORMAL HIGH (ref 70–99)
Phosphorus: 5.4 mg/dL — ABNORMAL HIGH (ref 2.5–4.6)
Potassium: 4.1 mmol/L (ref 3.5–5.1)
Sodium: 139 mmol/L (ref 135–145)

## 2018-12-17 LAB — IRON AND TIBC
Iron: 36 ug/dL — ABNORMAL LOW (ref 45–182)
Saturation Ratios: 11 % — ABNORMAL LOW (ref 17.9–39.5)
TIBC: 326 ug/dL (ref 250–450)
UIBC: 290 ug/dL

## 2018-12-17 LAB — POCT HEMOGLOBIN-HEMACUE: Hemoglobin: 9.2 g/dL — ABNORMAL LOW (ref 13.0–17.0)

## 2018-12-17 LAB — MAGNESIUM: Magnesium: 2 mg/dL (ref 1.7–2.4)

## 2018-12-17 LAB — FERRITIN: Ferritin: 317 ng/mL (ref 24–336)

## 2018-12-17 MED ORDER — EPOETIN ALFA-EPBX 10000 UNIT/ML IJ SOLN
20000.0000 [IU] | Freq: Once | INTRAMUSCULAR | Status: AC
  Administered 2018-12-17: 12:00:00 20000 [IU] via SUBCUTANEOUS

## 2018-12-17 MED ORDER — EPOETIN ALFA-EPBX 10000 UNIT/ML IJ SOLN
INTRAMUSCULAR | Status: AC
  Filled 2018-12-17: qty 2

## 2018-12-19 ENCOUNTER — Other Ambulatory Visit: Payer: Self-pay

## 2018-12-19 DIAGNOSIS — N185 Chronic kidney disease, stage 5: Secondary | ICD-10-CM

## 2018-12-26 ENCOUNTER — Ambulatory Visit (HOSPITAL_COMMUNITY)
Admission: RE | Admit: 2018-12-26 | Discharge: 2018-12-26 | Disposition: A | Discharge status: Home / Self Care | Payer: Medicare Other | Source: Ambulatory Visit | Attending: Family | Admitting: Family

## 2018-12-26 ENCOUNTER — Ambulatory Visit (INDEPENDENT_AMBULATORY_CARE_PROVIDER_SITE_OTHER): Payer: Self-pay | Admitting: Physician Assistant

## 2018-12-26 ENCOUNTER — Other Ambulatory Visit: Payer: Self-pay

## 2018-12-26 ENCOUNTER — Encounter: Payer: Self-pay | Admitting: *Deleted

## 2018-12-26 VITALS — BP 141/71 | HR 64 | Temp 97.4°F | Resp 16 | Ht 74.0 in | Wt 217.0 lb

## 2018-12-26 DIAGNOSIS — N185 Chronic kidney disease, stage 5: Secondary | ICD-10-CM | POA: Diagnosis not present

## 2018-12-26 NOTE — Progress Notes (Signed)
POST OPERATIVE OFFICE NOTE    CC:  F/u for surgery  HPI:  This is a 68 y.o. male who is s/p First stage left basilic vein fistula 01/0/9323 by Dr. Trula Slade..  He denise pain, numbness or loss of motor in the left UE.  He is not yet on HD.  No Known Allergies  Current Outpatient Medications  Medication Sig Dispense Refill  . acetaminophen (TYLENOL) 500 MG tablet Take 1,000 mg by mouth 2 (two) times daily as needed for moderate pain.    Marland Kitchen albuterol (PROVENTIL) (2.5 MG/3ML) 0.083% nebulizer solution USE ONE VIAL IN NEBULIZER EVERY 6 HOURS AS NEEDED FOR WHEEZING FOR SHORTNESS OF BREATH (Patient taking differently: Take 2.5 mg by nebulization every 4 (four) hours as needed for shortness of breath. ) 300 mL 3  . amLODipine (NORVASC) 10 MG tablet Take 1 tablet (10 mg total) by mouth daily. 90 tablet 1  . aspirin EC 81 MG tablet Take 1 tablet (81 mg total) by mouth daily. 90 tablet 3  . atorvastatin (LIPITOR) 40 MG tablet TAKE 1 TABLET BY MOUTH ONCE DAILY IN THE EVENING (Patient taking differently: Take 40 mg by mouth daily. ) 90 tablet 2  . carvedilol (COREG) 25 MG tablet Take 25 mg by mouth 2 (two) times daily with a meal.    . Cholecalciferol (VITAMIN D3) 50 MCG (2000 UT) TABS Take 4,000 Units by mouth daily.    . Cinnamon 500 MG capsule Take 2,000 mg by mouth daily.     . Coenzyme Q10 (CO Q 10 PO) Take 200 mg by mouth daily.     Marland Kitchen epoetin alfa-epbx (RETACRIT) 55732 UNIT/ML injection 20,000 Units every 14 (fourteen) days.     . fluticasone (FLONASE) 50 MCG/ACT nasal spray Place 2 sprays into both nostrils daily as needed for allergies or rhinitis (SEASONAL ALLERGIES).    . Fluticasone-Salmeterol (ADVAIR) 500-50 MCG/DOSE AEPB Inhale 1 puff into the lungs 2 (two) times daily as needed (respiratory issues.).    Marland Kitchen furosemide (LASIX) 40 MG tablet Take 80 mg by mouth 2 (two) times daily.    Marland Kitchen glucose blood (ONE TOUCH ULTRA TEST) test strip Use to test blood sugar three times daily. Dx: E11.21 900 each  1  . hydrALAZINE (APRESOLINE) 100 MG tablet Take 100 mg by mouth 2 (two) times daily.    . insulin detemir (LEVEMIR) 100 UNIT/ML injection Inject 30 Units into the skin every other day. In the morning    . isosorbide mononitrate (IMDUR) 30 MG 24 hr tablet Take 1 tablet (30 mg total) by mouth daily. 90 tablet 3  . metolazone (ZAROXOLYN) 5 MG tablet Take 5 mg by mouth 2 (two) times a week. Tuesday and Saturday only     . montelukast (SINGULAIR) 10 MG tablet Take 1 tablet (10 mg total) by mouth at bedtime. 90 tablet 1  . Multiple Vitamin (MULTIVITAMIN WITH MINERALS) TABS tablet Take 1 tablet by mouth daily.    . nitroGLYCERIN (NITROSTAT) 0.4 MG SL tablet Place 1 tablet (0.4 mg total) under the tongue every 5 (five) minutes as needed for chest pain. 25 tablet 3  . PROAIR HFA 108 (90 Base) MCG/ACT inhaler INHALE 2 PUFFS INTO LUNGS EVERY 6 HOURS AS NEEDED FOR SHORTNESS OF BREATH (Patient taking differently: Inhale 2 puffs into the lungs every 6 (six) hours as needed for shortness of breath. ) 18 g 1  . HYDROcodone-acetaminophen (NORCO) 5-325 MG tablet Take 1 tablet by mouth every 6 (six) hours as needed for  moderate pain. 12 tablet 0   No current facility-administered medications for this visit.    Facility-Administered Medications Ordered in Other Visits  Medication Dose Route Frequency Provider Last Rate Last Dose  . regadenoson (LEXISCAN) injection SOLN 0.4 mg  0.4 mg Intravenous Once Jerline Pain, MD         ROS:  See HPI  Physical Exam:  Findings: +--------------------+----------+-----------------+--------+ AVF                 PSV (cm/s)Flow Vol (mL/min)Comments +--------------------+----------+-----------------+--------+ Native artery inflow   216          1096                +--------------------+----------+-----------------+--------+ AVF Anastomosis        552                              +--------------------+----------+-----------------+--------+     +------------+----------+-------------+----------+-----------------------------+ OUTFLOW VEINPSV (cm/s)Diameter (cm)Depth (cm)          Describe            +------------+----------+-------------+----------+-----------------------------+ Prox UA        116        1.07        0.89                                 +------------+----------+-------------+----------+-----------------------------+ Mid UA         281        0.69        0.71          Retained valve         +------------+----------+-------------+----------+-----------------------------+ Dist UA        426                           Retained valve and competing                                                        branch 0.44          +------------+----------+-------------+----------+-----------------------------+ AC Fossa       823        0.38        0.72                                 +------------+----------+-------------+----------+-----------------------------+    Vitals:   12/26/18 1510  BP: (!) 141/71  Pulse: 64  Resp: 16  Temp: (!) 97.4 F (36.3 C)  SpO2: 98%      Incision:  Well healed left AC incision. Extremities:  Grip 5/5 and sensation intact   Assessment/Plan:  This is a 68 y.o. male who is s/p: Left first stage basilic AV fistula.  The fistula is deeper than 0.6 cm and the diameter is developing > 0.6 cm.  I will schedule him for second stage basilic transposition.     Roxy Horseman, PA-C [facility] [phone]  Clinic MD:  Donzetta Matters

## 2018-12-29 ENCOUNTER — Other Ambulatory Visit: Payer: Self-pay | Admitting: Nurse Practitioner

## 2018-12-29 ENCOUNTER — Encounter: Payer: Medicare Other | Admitting: Surgery

## 2018-12-29 ENCOUNTER — Encounter (HOSPITAL_COMMUNITY): Payer: Medicare Other

## 2018-12-29 DIAGNOSIS — J4521 Mild intermittent asthma with (acute) exacerbation: Secondary | ICD-10-CM

## 2018-12-31 ENCOUNTER — Encounter (HOSPITAL_COMMUNITY): Payer: Self-pay

## 2018-12-31 ENCOUNTER — Other Ambulatory Visit: Payer: Self-pay

## 2018-12-31 ENCOUNTER — Encounter (HOSPITAL_COMMUNITY)
Admission: RE | Admit: 2018-12-31 | Discharge: 2018-12-31 | Disposition: A | Discharge status: Home / Self Care | Payer: Medicare Other | Source: Ambulatory Visit | Attending: Nephrology | Admitting: Nephrology

## 2018-12-31 DIAGNOSIS — N184 Chronic kidney disease, stage 4 (severe): Secondary | ICD-10-CM | POA: Diagnosis not present

## 2018-12-31 DIAGNOSIS — D631 Anemia in chronic kidney disease: Secondary | ICD-10-CM | POA: Diagnosis not present

## 2018-12-31 LAB — POCT HEMOGLOBIN-HEMACUE: Hemoglobin: 8.9 g/dL — ABNORMAL LOW (ref 13.0–17.0)

## 2018-12-31 MED ORDER — EPOETIN ALFA-EPBX 10000 UNIT/ML IJ SOLN
INTRAMUSCULAR | Status: AC
  Filled 2018-12-31: qty 2

## 2018-12-31 MED ORDER — EPOETIN ALFA-EPBX 10000 UNIT/ML IJ SOLN
20000.0000 [IU] | Freq: Once | INTRAMUSCULAR | Status: AC
  Administered 2018-12-31: 20000 [IU] via SUBCUTANEOUS

## 2019-01-05 ENCOUNTER — Other Ambulatory Visit: Payer: Self-pay

## 2019-01-05 ENCOUNTER — Ambulatory Visit (INDEPENDENT_AMBULATORY_CARE_PROVIDER_SITE_OTHER): Payer: Medicare Other | Admitting: Cardiology

## 2019-01-05 ENCOUNTER — Encounter: Payer: Self-pay | Admitting: Cardiology

## 2019-01-05 VITALS — BP 120/60 | HR 59 | Ht 74.0 in | Wt 220.8 lb

## 2019-01-05 DIAGNOSIS — I255 Ischemic cardiomyopathy: Secondary | ICD-10-CM | POA: Diagnosis not present

## 2019-01-05 DIAGNOSIS — Z951 Presence of aortocoronary bypass graft: Secondary | ICD-10-CM

## 2019-01-05 DIAGNOSIS — I5042 Chronic combined systolic (congestive) and diastolic (congestive) heart failure: Secondary | ICD-10-CM | POA: Diagnosis not present

## 2019-01-05 DIAGNOSIS — N185 Chronic kidney disease, stage 5: Secondary | ICD-10-CM

## 2019-01-05 DIAGNOSIS — I10 Essential (primary) hypertension: Secondary | ICD-10-CM

## 2019-01-05 NOTE — Progress Notes (Signed)
Cardiology Office Note:    Date:  01/05/2019   ID:  Henry Carroll, DOB 29-Mar-1950, MRN [id number]  PCP:  [name], NP  Cardiologist:  Candee Furbish, MD  Electrophysiologist:  None   Referring MD: [name], NP     History of Present Illness:    Henry Carroll is a 68 y.o. 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 Trula Slade here for follow-up.  Also has OSA followed by [name] last seen by Truitt Merle.  Overall doing fairly well.  Has lost some weight.  Denies any fevers chills nausea vomiting syncope bleeding.  He has been careful.  His right eye has improved after surgery, injections.  He had a vitreous hemorrhage.  He is still enjoying playing golf albeit at the driving range currently given Covid.  NYHA class I-II currently.   He has been seeing Dr. Graylon Gunning with nephrology.   Past Medical History:  Diagnosis Date   Abnormal stress test 02/10/2015   Acute on chronic systolic and diastolic heart failure, NYHA class 1 ([place]) 12/31/2014   Anemia    Anemia in chronic kidney disease 09/03/2012   Arthritis    left  5th finger   Asthma    Asthma, chronic 06/04/2012   Asthma, chronic, mild intermittent, with acute exacerbation 12/22/2015   Bilateral lower extremity edema 12/22/2015   CAD (coronary artery disease) 02/18/2015   Chronic combined systolic (congestive) and diastolic (congestive) heart failure ([place])    a. 12/31/14: 2D ECHO: EF 40-45%, HK of inf myocardium, G1DD, mod MR   Chronic combined systolic and diastolic CHF (congestive heart failure) ([place]) 10/62/6948   Chronic systolic heart failure ([place]) 02/25/2015   CKD (chronic kidney disease), stage III    stage 3 kidney disease   CKD (chronic kidney disease), stage V ([place]) 08/30/2018   COLONIC POLYPS, HX OF 04/13/2009   Qualifier: Diagnosis of  By: Westly Pam.     Coronary artery disease    a. LHC 01/2015 - triple vessel CAD (mod oLM, mLAD, severe mRCA, intermediate branch stenosis, CTO of mCx). Plan CABG 02/2015.   Demand ischemia ([place]) 12/31/2014   DM type 2, uncontrolled, with renal complications ([place]) 5/46/2703   Elevated troponin    Essential hypertension 5/00/9381   Folliculitis of perineum 10/01/2012   Gastric erosion    GERD (gastroesophageal reflux disease)    Hyperlipidemia    Hyperlipidemia with target LDL less than 100 12/31/2014   Hypertension    Hypertensive heart disease with heart failure ([place]) 02/25/2015   IDA (iron deficiency anemia) 01/23/2016   Melena 01/23/2016   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   Mitral regurgitation    a. Mild-mod by echo 12/2014.   Morbid obesity ([place]) 12/29/2014   Myocardial infarction [facility])    pt. states per [name] he has in the past   NSVT (nonsustained ventricular tachycardia) ([place])    a. 9 beats during 01/2015 adm. BB titrated.   Obesity    a. BMI 33   Obesity (BMI 30-39.9) 05/05/2013   OSA (obstructive sleep apnea) 01/16/2018   Mild obstructive sleep apnea overall with an AHI of 7.3/h and no significant central sleep apnea. Severe obstructive sleep apnea during REM sleep with an AHI of 34.3/h.  Now on CPAP at 6 cm H2O.    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 [name]: Ileocecal valve appeared normal, scattered pancolonic diverticulosis, difficult bowel prep making smaller lesions potentially missed. Recommended three-year follow-up colonoscopy.   COLONOSCOPY  2003   Dr. [name]: Suspicious lesion at the ileocecal valve, multiple biopsies benign, pancolonic diverticulosis.   COLONOSCOPY N/A 02/15/2016   diverticulosis in sigmoid and descending colon, single 5 mm polyp at splenic flexure (Tubular adenoma)   CORONARY ARTERY BYPASS GRAFT N/A 02/18/2015   Procedure: CORONARY ARTERY BYPASS GRAFTING (CABG) x  four, using bilateral internal mammary arteries and right leg greater saphenous vein harvested endoscopically;  Surgeon: [name], MD;  Location: [facility];  Service: Open Heart Surgery;  Laterality: N/A;   ESOPHAGOGASTRODUODENOSCOPY N/A 02/15/2016   normal   EYE SURGERY Right    July 2019    GIVENS CAPSULE STUDY N/A 01/08/2017   couple of gastric and small bowel eroions in setting of aspirin 81 mg daily but nothing concerning, continue Hematology follow-up   HERNIA REPAIR  4098   Umbilical   LUMBAR [place]  2006   L4 & L5   REFRACTIVE SURGERY Left    2019    REFRACTIVE SURGERY Right    2019   TEE WITHOUT CARDIOVERSION N/A 02/18/2015   Procedure: TRANSESOPHAGEAL ECHOCARDIOGRAM (TEE);  Surgeon: [name], MD;  Location: [facility];  Service: Open Heart Surgery;  Laterality: N/A;   TONSILLECTOMY  1962    Current Medications: Current Meds  Medication Sig   acetaminophen (TYLENOL) 500 MG tablet Take 1,000 mg by mouth 2 (two) times daily as needed for moderate pain.   albuterol (PROVENTIL) (2.5 MG/3ML) 0.083% nebulizer solution USE ONE VIAL IN NEBULIZER EVERY 6 HOURS AS NEEDED FOR  WHEEZING FOR SHORTNESS OF BREATH   amLODipine (NORVASC) 10 MG tablet Take 1 tablet by mouth once daily   aspirin EC 81 MG tablet Take 1 tablet (81 mg total) by mouth daily.   atorvastatin (LIPITOR) 40 MG tablet TAKE 1 TABLET BY MOUTH ONCE DAILY IN THE EVENING   carvedilol (COREG) 25 MG tablet Take 25 mg by mouth 2 (two) times daily with a meal.   Cholecalciferol (VITAMIN D3) 50 MCG (2000 UT) TABS Take 4,000 Units by mouth daily.   Cinnamon 500 MG capsule Take 2,000 mg by mouth daily.    Coenzyme Q10 (CO Q 10 PO) Take 200 mg by mouth daily.    epoetin alfa-epbx (RETACRIT) 11914 UNIT/ML injection 20,000 Units every 14 (fourteen) days.    fluticasone (FLONASE) 50 MCG/ACT nasal spray Place 2 sprays into both nostrils daily as needed for allergies or rhinitis (SEASONAL ALLERGIES).   Fluticasone-Salmeterol (ADVAIR) 500-50  MCG/DOSE AEPB Inhale 1 puff into the lungs 2 (two) times daily as needed (respiratory issues.).   furosemide (LASIX) 40 MG tablet Take 80 mg by mouth 2 (two) times daily.   glucose blood (ONE TOUCH ULTRA TEST) test strip Use to test blood sugar three times daily. Dx: E11.21   hydrALAZINE (APRESOLINE) 50 MG tablet Take 50 mg by mouth 2 (two) times daily.   insulin detemir (LEVEMIR) 100 UNIT/ML injection Inject 30 Units into the skin every other day. In the morning   isosorbide mononitrate (IMDUR) 30 MG 24 hr tablet Take 1 tablet (30 mg total) by mouth daily.   metolazone (ZAROXOLYN) 5 MG tablet Take 5 mg by mouth 2 (two) times a week. Tuesday and Saturday only    montelukast (SINGULAIR) 10 MG tablet TAKE 1 TABLET BY MOUTH AT BEDTIME   Multiple Vitamin (MULTIVITAMIN WITH MINERALS) TABS tablet Take 1 tablet by mouth daily.   nitroGLYCERIN (NITROSTAT) 0.4 MG SL tablet Place 1 tablet (0.4 mg total) under the tongue every 5 (five) minutes as needed for chest pain.   PROAIR HFA 108 (90 Base) MCG/ACT inhaler INHALE 2 PUFFS INTO LUNGS EVERY 6 HOURS AS NEEDED FOR SHORTNESS  OF BREATH     Allergies:   Patient has no known allergies.   Social History   Socioeconomic History   Marital status: Married    Spouse name: Not on file   Number of children: Not on file   Years of education: Not on file   Highest education level: Not on file  Occupational History   Not on file  Social Needs   Financial resource strain: Not hard at all   Food insecurity    Worry: Never true    Inability: Never true   Transportation needs    Medical: No    Non-medical: No  Tobacco Use   Smoking status: Former Smoker    Years: 17.00    Types: Cigars    Quit date: 12/31/2014    Years since quitting: 4.0   Smokeless tobacco: Never Used  Substance and Sexual Activity   Alcohol use: Not Currently    Alcohol/week: 0.0 standard drinks    Comment: occasional glass of wine.   Drug use: No   Sexual activity: Yes    Birth control/protection: None  Lifestyle   Physical activity    Days per week: 5 days    Minutes per session: 30 min   Stress: Only a little  Relationships   Social connections    Talks on phone: More than three times a week    Gets together: More than three times a week    Attends religious service: More than 4 times per year    Active member of club or organization: Yes    Attends meetings of clubs or organizations: 1 to 4 times per year    Relationship status: Married  Other Topics Concern   Not on file  Social History Narrative   Not on file     Family History: The patient's family history includes Colon cancer in his paternal uncle; Diabetes in his mother; Heart disease in his father and mother; Hypertension in his father; Kidney disease in his daughter; Stroke in his sister; Sudden death in his daughter. There is no history of Heart attack.  ROS:   Please see the history of present illness.     All other systems reviewed and are negative.  EKGs/Labs/Other Studies Reviewed:    The following studies were reviewed  today:  Echocardiogram 11/15/2016: - Left ventricle: The cavity size was mildly dilated. Wall   thickness was normal. Systolic function was mildly to moderately   reduced. The estimated ejection fraction was in the range of 40%   to 45%. There is hypokinesis of the inferolateral and inferior   myocardium. Doppler parameters are consistent with abnormal left   ventricular relaxation (grade 1 diastolic dysfunction). - Ascending aorta: The ascending aorta was mildly dilated. - Mitral valve: Calcified annulus. There was mild regurgitation. - Left atrium: The atrium was mildly dilated. - Right atrium: The atrium was mildly dilated. - Pulmonary arteries: Systolic pressure was mildly increased. PA   peak pressure: 44 mm Hg (S).  Impressions:  - Hypokinesis of the inferior and inferolateral walls with overall   mild to moderate LV dysfunction; mild diastolic dysfunction;   mildly dilated ascending aorta; mild MR; mild biatrial   enlargement; mild TR with mildly elevated pulmonary pressure.  Myoview Study Highlights 10/2016    The left ventricular ejection fraction is moderately decreased (30-44%).  Nuclear stress EF: 40%.  There was no ST segment deviation noted during stress.  Findings consistent with prior myocardial infarction with peri-infarct ischemia.  This is an intermediate risk study.  1. EF 40% with inferolateral hypokinesis.  2. Primarily fixed medium-sized, moderate intensity basal to mid inferolateral and basal inferior perfusion defect. This is suggestive of prior infarction with mild peri-infarct ischemia.   Intermediate risk study primarily due to low EF.      EKG:  11/19/2017-sinus rhythm 63 with nonspecific ST-T wave changes, cannot exclude old inferior infarct pattern.  Personally reviewed and interpreted.  Recent Labs: 04/01/2018: NT-Pro BNP 2,186 10/23/2018: B Natriuretic Peptide 2,625.6 11/19/2018: ALT 7 12/17/2018: BUN 137; Creatinine, Ser 7.07;  Magnesium 2.0; Platelets 186; Potassium 4.1; Sodium 139 12/31/2018: Hemoglobin 8.9  Recent Lipid Panel    Component Value Date/Time   CHOL 151 08/28/2018 1036   CHOL 149 06/01/2015 1500   TRIG 104 08/28/2018 1036   HDL 51 08/28/2018 1036   HDL 45 06/01/2015 1500   CHOLHDL 3.0 08/28/2018 1036   VLDL 33 (H) 09/26/2016 0903   LDLCALC 80 08/28/2018 1036    Physical Exam:    VS:  BP 120/60    Pulse (!) 59    Ht 6\' 2"  (1.88 m)    Wt 220 lb 12.8 oz (100.2 kg)    SpO2 98%    BMI 28.35 kg/m     Wt Readings from Last 3 Encounters:  01/05/19 220 lb 12.8 oz (100.2 kg)  12/26/18 217 lb (98.4 kg)  12/17/18 213 lb 13.5 oz (97 kg)     GEN: Well nourished, well developed, in no acute distress  HEENT: normal  Neck: no JVD, carotid bruits, or masses Cardiac: RRR; no murmurs, rubs, or gallops,no edema chest scar noted, left extremity bruit Respiratory:  clear to auscultation bilaterally, normal work of breathing GI: soft, nontender, nondistended, + BS MS: no deformity or atrophy  Skin: warm and dry, no rash Neuro:  Alert and Oriented x 3, Strength and sensation are intact Psych: euthymic mood, full affect   ASSESSMENT:    1. Chronic combined systolic and diastolic CHF (congestive heart failure) ([place])   2. CKD (chronic kidney disease), stage V ([place])   3. S/P CABG x 4   4. Essential hypertension    PLAN:    In order of problems listed above:  CAD CABG 2017 - Nuclear stress test was performed in 2018 that was  stable.  Continue with aggressive secondary risk factor prevention  Chronic systolic heart failure EF 45% with ischemic cardiomyopathy -Getting close to hemodialysis needs for fluid management.  Currently taking Lasix 80 twice a day, hydralazine, carvedilol 25 twice a day, amlodipine, isosorbide.  Hypertensive heart disease with heart failure - CKD stage IV to V.  Will increase coreg to 25 BID.  Dr. Posey Pronto increased lasix.  80/80.  No signs of significant bradycardia.  Diabetes  with chronic kidney disease and hypertension - Primary care physician has been following closely. Dr. Posey Pronto  Chronic anemia - Iron infusions, renal mediated as well. No change  Eye hemorrhage-Dr. Zadie Rhine. Right eye. Prior issue  Sleep disorder/OSA - Sleep study has been performed, obstructive sleep apnea noted by [name].  Had first stage left basilic vein fistula 68/07/1681 by Dr. Trula Slade.  Left upper extremity.  We will have him come back in and see Cecille Rubin in 6 months, me in 12 months  Medication Adjustments/Labs and Tests Ordered: Current medicines are reviewed at length with the patient today.  Concerns regarding medicines are outlined above.  No orders of the defined types were placed in this encounter.  No orders of the defined types were placed in this encounter.   Patient Instructions  Medication Instructions:  The current medical regimen is effective;  continue present plan and medications.  *If you need a refill on your cardiac medications before your next appointment, please call your pharmacy*  Follow-Up: At [facility], you and your health needs are our priority.  As part of our continuing mission to provide you with exceptional heart care, we have created designated Provider Care Teams.  These Care Teams include your primary Cardiologist (physician) and Advanced Practice Providers (APPs -  Physician Assistants and Nurse Practitioners) who all work together to provide you with the care you need, when you need it.  Your next appointment:   6 month(s)  The format for your next appointment:   In Person  Provider:   You may see Candee Furbish, MD or one of the following Advanced Practice Providers on your designated Care Team:    Truitt Merle, NP  Cecilie Kicks, NP  Kathyrn Drown, NP   Thank you for choosing [facility]!!        Signed, Candee Furbish, MD  01/05/2019 11:55 AM    [place]

## 2019-01-05 NOTE — Patient Instructions (Signed)
Medication Instructions:  The current medical regimen is effective;  continue present plan and medications.  *If you need a refill on your cardiac medications before your next appointment, please call your pharmacy*  Follow-Up: At [facility], you and your health needs are our priority.  As part of our continuing mission to provide you with exceptional heart care, we have created designated Provider Care Teams.  These Care Teams include your primary Cardiologist (physician) and Advanced Practice Providers (APPs -  Physician Assistants and Nurse Practitioners) who all work together to provide you with the care you need, when you need it.  Your next appointment:   6 month(s)  The format for your next appointment:   In Person  Provider:   You may see Candee Furbish, MD or one of the following Advanced Practice Providers on your designated Care Team:    Truitt Merle, NP  Cecilie Kicks, NP  Kathyrn Drown, NP   Thank you for choosing [facility]!!

## 2019-01-06 ENCOUNTER — Ambulatory Visit: Payer: Medicare Other | Admitting: Nurse Practitioner

## 2019-01-07 ENCOUNTER — Other Ambulatory Visit: Payer: Self-pay

## 2019-01-07 ENCOUNTER — Ambulatory Visit (INDEPENDENT_AMBULATORY_CARE_PROVIDER_SITE_OTHER): Payer: Medicare Other | Admitting: Nurse Practitioner

## 2019-01-07 ENCOUNTER — Telehealth: Payer: Self-pay

## 2019-01-07 ENCOUNTER — Encounter: Payer: Self-pay | Admitting: Nurse Practitioner

## 2019-01-07 VITALS — BP 128/72 | HR 55 | Temp 97.3°F | Ht 74.0 in | Wt 220.0 lb

## 2019-01-07 DIAGNOSIS — I5042 Chronic combined systolic (congestive) and diastolic (congestive) heart failure: Secondary | ICD-10-CM

## 2019-01-07 DIAGNOSIS — E1129 Type 2 diabetes mellitus with other diabetic kidney complication: Secondary | ICD-10-CM | POA: Diagnosis not present

## 2019-01-07 DIAGNOSIS — D631 Anemia in chronic kidney disease: Secondary | ICD-10-CM | POA: Diagnosis not present

## 2019-01-07 DIAGNOSIS — I255 Ischemic cardiomyopathy: Secondary | ICD-10-CM

## 2019-01-07 DIAGNOSIS — J45909 Unspecified asthma, uncomplicated: Secondary | ICD-10-CM

## 2019-01-07 DIAGNOSIS — I2510 Atherosclerotic heart disease of native coronary artery without angina pectoris: Secondary | ICD-10-CM | POA: Diagnosis not present

## 2019-01-07 DIAGNOSIS — E1165 Type 2 diabetes mellitus with hyperglycemia: Secondary | ICD-10-CM

## 2019-01-07 DIAGNOSIS — E1169 Type 2 diabetes mellitus with other specified complication: Secondary | ICD-10-CM

## 2019-01-07 DIAGNOSIS — I2583 Coronary atherosclerosis due to lipid rich plaque: Secondary | ICD-10-CM | POA: Diagnosis not present

## 2019-01-07 DIAGNOSIS — E785 Hyperlipidemia, unspecified: Secondary | ICD-10-CM | POA: Diagnosis not present

## 2019-01-07 DIAGNOSIS — N185 Chronic kidney disease, stage 5: Secondary | ICD-10-CM

## 2019-01-07 DIAGNOSIS — IMO0002 Reserved for concepts with insufficient information to code with codable children: Secondary | ICD-10-CM

## 2019-01-07 DIAGNOSIS — G4733 Obstructive sleep apnea (adult) (pediatric): Secondary | ICD-10-CM | POA: Diagnosis not present

## 2019-01-07 DIAGNOSIS — D472 Monoclonal gammopathy: Secondary | ICD-10-CM

## 2019-01-07 MED ORDER — INSULIN DETEMIR 100 UNIT/ML ~~LOC~~ SOLN: 15.0000 [IU] | Freq: Every day | SUBCUTANEOUS | 3 refills | Status: DC

## 2019-01-07 NOTE — Telephone Encounter (Signed)
Patient was in office today and indicated he had eye exam in February 2020.  I called Dr.Rankin's office and left detailed message requesting last diabetic eye exam. Office was closed today in observance of the Thanksgiving Holiday.

## 2019-01-07 NOTE — [place] Notes (Signed)
Careteam: Patient Care Team: [name], NP as PCP - General (Geriatric Medicine) Jerline Pain, MD as PCP - Cardiology (Cardiology) Daneil Dolin, MD as Consulting Physician (Gastroenterology) Zadie Rhine Clent Demark, MD as Consulting Physician (Ophthalmology) Elmarie Shiley, MD as Consulting Physician (Nephrology)  Advanced Directive information Does Patient Have a Medical Advance Directive?: No, Does patient want to make changes to medical advance directive?: Yes (MAU/Ambulatory/Procedural Areas - Information given)(Already with paperwork)  No Known Allergies  Chief Complaint  Patient presents with  . Medical Management of Chronic Issues    4 month follow-up      HPI: Patient is a 68 y.o. male seen in the office today for routine follow up.  Has routine follow up with cardiology, hemalogy, nephrology  Saw cardiologist earlier this week  DM- has made dietary changes. Needing less insulin.  No low blood sugar reactions. Using levemir 30 units every other day to every 3 days.   Obesity- since he is at higher risk for complication due to weight he has worked hard to lose weight through diet and exercise.  Walking on the treadmill for 30 mins every day.   CHF- current fluid status is good. Taking 80 mg twice daily with metolazone 5 mg 2 times a week. Fluid status is becoming more of an issue as he approaching dialysis status  ESRD- dialysis is approaching. He is not uncomfortable or having pain, Dr Posey Pronto is monitoring.   OSA- does not use CPAP often- does not tolerate  Asthma- no recent flares, doing well.   Anemia- continues to get procit every other week and iron infusion occasional which helps energy level.   Reports urology referral was 10/10  Review of Systems:  Review of Systems  Constitutional: Positive for malaise/fatigue (low energy). Negative for chills, fever and weight loss.  HENT: Negative for tinnitus.   Respiratory: Negative for cough, sputum production  and shortness of breath.   Cardiovascular: Positive for leg swelling. Negative for chest pain and palpitations.  Gastrointestinal: Negative for abdominal pain, constipation, diarrhea and heartburn.  Genitourinary: Negative for frequency.  Musculoskeletal: Negative for back pain, falls, joint pain and myalgias.  Skin: Negative.   Neurological: Negative for dizziness and headaches.  Psychiatric/Behavioral: Negative for depression and memory loss. The patient does not have insomnia.     Past Medical History:  Diagnosis Date  . Abnormal stress test 02/10/2015  . Acute on chronic systolic and diastolic heart failure, NYHA class 1 ([place]) 12/31/2014  . Anemia   . Anemia in chronic kidney disease 09/03/2012  . 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 Asthma   . Asthma, chronic 06/04/2012  . Asthma, chronic, mild intermittent, with acute exacerbation 12/22/2015  . Bilateral lower extremity edema 12/22/2015  . CAD (coronary artery disease) 02/18/2015  . Chronic combined systolic (congestive) and diastolic (congestive) heart failure ([place])    a. 12/31/14: 2D ECHO: EF 40-45%, HK of inf myocardium, G1DD, mod MR  . Chronic combined systolic and diastolic CHF (congestive heart failure) ([place]) 02/10/2015  . Chronic systolic heart failure ([place]) 02/25/2015  . CKD (chronic kidney disease), stage III    stage 3 kidney disease  . CKD (chronic kidney disease), stage V ([place]) 08/30/2018  . COLONIC POLYPS, HX OF 04/13/2009   Qualifier: Diagnosis of  By: Westly Pam.   . Coronary artery disease    a. LHC 01/2015 - triple vessel CAD (mod oLM, mLAD, severe mRCA, intermediate branch stenosis, CTO of  mCx). Plan CABG 02/2015.  Marland Kitchen Demand ischemia ([place]) 12/31/2014  . DM type 2, uncontrolled, with renal complications ([place]) 7/91/5041  . Elevated troponin   . Essential hypertension 10/09/2013  . Folliculitis of perineum 10/01/2012  . Gastric erosion   . GERD (gastroesophageal reflux disease)   . Hyperlipidemia   .  Hyperlipidemia with target LDL less than 100 12/31/2014  . Hypertension   . Hypertensive heart disease with heart failure ([place]) 02/25/2015  . IDA (iron deficiency anemia) 01/23/2016  . Melena 01/23/2016  . 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 Mitral regurgitation    a. Mild-mod by echo 12/2014.  . Morbid obesity ([place]) 12/29/2014  . Myocardial infarction [facility])    pt. states per [name] he has in the past  . NSVT (nonsustained ventricular tachycardia) ([place])    a. 9 beats during 01/2015 adm. BB titrated.  . Obesity    a. BMI 33  . Obesity (BMI 30-39.9) 05/05/2013  . OSA (obstructive sleep apnea) 01/16/2018   Mild obstructive sleep apnea overall with an AHI of 7.3/h and no significant central sleep apnea. Severe obstructive sleep apnea during REM sleep with an AHI of 34.3/h.  Now on CPAP at 6 cm H2O.   . Other malaise and fatigue 09/03/2012  . 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 PAD (peripheral artery disease) ([place]) 03/02/2015  . Pneumonia   . Sleep apnea    2019, Dr.Turner diagnoised   . Small bowel lesion   . Symptomatic anemia 08/29/2018  . 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 Type II diabetes mellitus ([place])   . Wears dentures    Past Surgical History:  Procedure Laterality Date  . BACK SURGERY    . BASCILIC VEIN TRANSPOSITION Left 11/14/2018   Procedure: FIRST STAGE BASILIC VEIN TRANSPOSITION LEFT ARM;  Surgeon: Serafina Mitchell, MD;  Location: [facility];  Service: Vascular;  Laterality: Left;  . BONE MARROW BIOPSY    . CARDIAC CATHETERIZATION N/A 02/09/2015   Procedure: Left Heart Cath and Coronary Angiography;  Surgeon: Burnell Blanks, MD;  Location: [facility];  Service: Cardiovascular;  Laterality: N/A;  . COLONOSCOPY  2011   Dr. [name]: Ileocecal valve appeared normal, scattered pancolonic diverticulosis, difficult bowel prep making smaller lesions potentially missed. Recommended three-year follow-up colonoscopy.  .  COLONOSCOPY  2003   Dr. [name]: Suspicious lesion at the ileocecal valve, multiple biopsies benign, pancolonic diverticulosis.  . COLONOSCOPY N/A 02/15/2016   diverticulosis in sigmoid and descending colon, single 5 mm polyp at splenic flexure (Tubular adenoma)  . CORONARY ARTERY BYPASS GRAFT N/A 02/18/2015   Procedure: CORONARY ARTERY BYPASS GRAFTING (CABG) x  four, using bilateral internal mammary arteries and right leg greater saphenous vein harvested endoscopically;  Surgeon: [name], MD;  Location: [facility] OR;  Service: Open Heart Surgery;  Laterality: N/A;  . ESOPHAGOGASTRODUODENOSCOPY N/A 02/15/2016   normal  . EYE SURGERY Right    July 2019   . GIVENS CAPSULE STUDY N/A 01/08/2017   couple of gastric and small bowel eroions in setting of aspirin 81 mg daily but nothing concerning, continue Hematology follow-up  . HERNIA REPAIR  3643   Umbilical  . LUMBAR [place] SURGERY  2006   L4 & L5  . REFRACTIVE SURGERY Left    2019   . REFRACTIVE SURGERY Right    2019  . TEE WITHOUT CARDIOVERSION N/A 02/18/2015   Procedure: TRANSESOPHAGEAL ECHOCARDIOGRAM (TEE);  Surgeon: [name], MD;  Location: [facility];  Service:  Open Heart Surgery;  Laterality: N/A;  . TONSILLECTOMY  1962   Social History:   reports that he quit smoking about 4 years ago. His smoking use included cigars. He quit after 17.00 years of use. He has never used smokeless tobacco. He reports current alcohol use. He reports that he does not use drugs.  Family History  Problem Relation Age of Onset  . Diabetes Mother   . Heart disease Mother        later in life, age >19  . Heart disease Father        heart failure later in life  . Hypertension Father   . Stroke Sister   . Kidney disease Daughter   . Sudden death Daughter   . Colon cancer Paternal Uncle        Passed from colon CA  . Heart attack Neg Hx     Medications: Patient's Medications  New Prescriptions   No medications on file  Previous Medications    ACETAMINOPHEN (TYLENOL) 500 MG TABLET    Take 1,000 mg by mouth 2 (two) times daily as needed for moderate pain.   ALBUTEROL (PROVENTIL) (2.5 MG/3ML) 0.083% NEBULIZER SOLUTION    USE ONE VIAL IN NEBULIZER EVERY 6 HOURS AS NEEDED FOR WHEEZING FOR SHORTNESS OF BREATH   AMLODIPINE (NORVASC) 10 MG TABLET    Take 1 tablet by mouth once daily   ASPIRIN EC 81 MG TABLET    Take 1 tablet (81 mg total) by mouth daily.   ATORVASTATIN (LIPITOR) 40 MG TABLET    TAKE 1 TABLET BY MOUTH ONCE DAILY IN THE EVENING   CARVEDILOL (COREG) 25 MG TABLET    Take 25 mg by mouth 2 (two) times daily with a meal.   CHOLECALCIFEROL (VITAMIN D3) 50 MCG (2000 UT) TABS    Take 4,000 Units by mouth daily.   CINNAMON 500 MG CAPSULE    Take 2,000 mg by mouth daily.    COENZYME Q10 (CO Q 10 PO)    Take 200 mg by mouth daily.    EPOETIN ALFA-EPBX (RETACRIT) 31540 UNIT/ML INJECTION    20,000 Units every 14 (fourteen) days.    FLUTICASONE (FLONASE) 50 MCG/ACT NASAL SPRAY    Place 2 sprays into both nostrils daily as needed for allergies or rhinitis (SEASONAL ALLERGIES).   FLUTICASONE-SALMETEROL (ADVAIR) 500-50 MCG/DOSE AEPB    Inhale 1 puff into the lungs 2 (two) times daily as needed (respiratory issues.).   FUROSEMIDE (LASIX) 40 MG TABLET    Take 80 mg by mouth 2 (two) times daily.   GLUCOSE BLOOD (ONE TOUCH ULTRA TEST) TEST STRIP    Use to test blood sugar three times daily. Dx: E11.21   HYDRALAZINE (APRESOLINE) 50 MG TABLET    Take 50 mg by mouth 2 (two) times daily.   INSULIN DETEMIR (LEVEMIR) 100 UNIT/ML INJECTION    Inject 30 Units into the skin every other day. In the morning   ISOSORBIDE MONONITRATE (IMDUR) 30 MG 24 HR TABLET    Take 1 tablet (30 mg total) by mouth daily.   METOLAZONE (ZAROXOLYN) 5 MG TABLET    Take 5 mg by mouth 2 (two) times a week. Tuesday and Saturday only    MONTELUKAST (SINGULAIR) 10 MG TABLET    TAKE 1 TABLET BY MOUTH AT BEDTIME   MULTIPLE VITAMIN (MULTIVITAMIN WITH MINERALS) TABS TABLET    Take 1 tablet  by mouth daily.   NITROGLYCERIN (NITROSTAT) 0.4 MG SL TABLET    Place  1 tablet (0.4 mg total) under the tongue every 5 (five) minutes as needed for chest pain.   PROAIR HFA 108 (90 BASE) MCG/ACT INHALER    INHALE 2 PUFFS INTO LUNGS EVERY 6 HOURS AS NEEDED FOR SHORTNESS OF BREATH  Modified Medications   No medications on file  Discontinued Medications   No medications on file    Physical Exam:  Vitals:   01/07/19 1021  BP: 128/72  Pulse: (!) 55  Temp: (!) 97.3 F (36.3 C)  TempSrc: Temporal  SpO2: 96%  Weight: 220 lb (99.8 kg)  Height: '6\' 2"'$  (1.88 m)   Body mass index is 28.25 kg/m. Wt Readings from Last 3 Encounters:  01/07/19 220 lb (99.8 kg)  01/05/19 220 lb 12.8 oz (100.2 kg)  12/26/18 217 lb (98.4 kg)    Physical Exam Constitutional:      Appearance: Normal appearance.  Eyes:     Pupils: Pupils are equal, round, and reactive to light.  Cardiovascular:     Rate and Rhythm: Normal rate and regular rhythm.     Pulses: Normal pulses.  Pulmonary:     Effort: Pulmonary effort is normal.     Breath sounds: Normal breath sounds.  Abdominal:     General: Bowel sounds are normal.  Musculoskeletal: Normal range of motion.     Right lower leg: Edema present.     Left lower leg: Edema present.  Skin:    General: Skin is warm and dry.  Neurological:     General: No focal deficit present.     Mental Status: He is alert and oriented to person, place, and time.  Psychiatric:        Mood and Affect: Mood normal.     Labs reviewed: Basic Metabolic Panel: Recent Labs    08/30/18 0517  10/08/18 1155  10/23/18 1617 11/14/18 0656 11/19/18 1119 11/19/18 1200 11/19/18 1213 12/17/18 1134  NA 140   < > 138   < > 137 136 137  --   --  139  K 4.0   < > 4.7   < > 4.4 4.3 4.5  --   --  4.1  CL 105   < > 102   < > 105 98 97*  --   --  102  CO2 23   < > 27   < > 21*  --  27  --   --  23  GLUCOSE 112*   < > 160*   < > 186* 156* 100*  --   --  150*  BUN 85*   < > 90*   < >  88* 116* 131*  --   --  137*  CREATININE 5.47*   < > 6.32*   < > 6.08* 6.70* 6.68*  --   --  7.07*  CALCIUM 9.3   < > 8.8*   < > 9.0  --  9.2  --   --  8.9  MG 2.4  --   --   --   --   --   --  2.9*  --  2.0  PHOS  --   --  4.6  --   --   --   --   --  5.1* 5.4*   < > = values in this interval not displayed.   Liver Function Tests: Recent Labs    09/23/18 1305  10/14/18 1430 11/19/18 1119 12/17/18 1134  AST 24  --  17 18  --  ALT 19  --  15 7  --   ALKPHOS 42  --  51 46  --   BILITOT 0.6  --  0.2* 0.2*  --   PROT 6.4*  --  6.4* 6.5  --   ALBUMIN 3.3*   < > 3.4* 3.5 3.6   < > = values in this interval not displayed.   No results for input(s): LIPASE, AMYLASE in the last 8760 hours. No results for input(s): AMMONIA in the last 8760 hours. CBC: Recent Labs    10/14/18 1430  10/23/18 1617  11/14/18 0656 11/19/18 1119  12/17/18 1134 12/17/18 1142 12/31/18 1102  WBC 4.4  --  5.4  --   --  4.8  --  4.7  --   --   NEUTROABS 2.7  --   --   --   --  3.2  --  3.3  --   --   HGB 9.3*   < > 9.2*   < > 9.5* 9.4*   < > 9.1* 9.2* 8.9*  HCT 28.9*  --  29.3*  --  28.0* 29.7*  --  28.6*  --   --   MCV 99.3  --  100.0  --   --  97.7  --  97.3  --   --   PLT 263  --  212  --   --  219  --  186  --   --    < > = values in this interval not displayed.   Lipid Panel: Recent Labs    02/27/18 1358 08/28/18 1036  CHOL 180 151  HDL 42 51  LDLCALC 113* 80  TRIG 132 104  CHOLHDL 4.3 3.0   TSH: No results for input(s): TSH in the last 8760 hours. A1C: Lab Results  Component Value Date   HGBA1C 7.1 (H) 08/28/2018     Assessment/Plan 1. Coronary artery disease due to lipid rich plaque Stable, without chest pains. Continues on ASA and imdur.   2. Chronic asthma, unspecified asthma severity, unspecified whether complicated, unspecified whether persistent Stable, no recent flares  3. OSA (obstructive sleep apnea) Ongoing, does not use CPAP consistently.   4. DM type 2,  uncontrolled, with renal complications (HCC) -using levemir 30 units every other day to every 3 days. Has had weight loss with diet and exercise modifications. Will decrease levemir down to 15 units. To continue to hold if blood sugars are low as he is at increase risk for hypoglycemia due to CKD.  - Hemoglobin A1c - insulin detemir (LEVEMIR) 100 UNIT/ML injection; Inject 0.15 mLs (15 Units total) into the skin daily. In the morning  Dispense: 10 mL; Refill: 3  5. CKD (chronic kidney disease), stage V ([place]) -progressive decline in renal function not on dialysis yet. Working with vascular on fistula.   6. MGUS (monoclonal gammopathy of unknown significance) Close follow up by hematology  7. Anemia due to stage 5 chronic kidney disease, not on chronic dialysis ([place]) -followed by nephrology, procrit injection every other week with occasional iron transfusion by hematology   8. Hyperlipidemia associated with type 2 diabetes mellitus ([place]) -continues on lipitor 10 mg daily. Has made dietary modifications.  - Hemoglobin A1c - Lipid panel  9. CHF  Stable, continues on lasix 80 mg BID with metolazone 5 mg BID  Next appt: 4 months, sooner if needed Camera Krienke [place]. [place], [facility] [phone]

## 2019-01-08 LAB — LIPID PANEL
Cholesterol: 114 mg/dL (ref <200)
HDL: 30 mg/dL — ABNORMAL LOW (ref >=40)
LDL Cholesterol (Calc): 62 mg/dL (calc)
Non-HDL Cholesterol (Calc): 84 mg/dL (calc) (ref <130)
Total CHOL/HDL Ratio: 3.8 (calc) (ref <5.0)
Triglycerides: 132 mg/dL (ref <150)

## 2019-01-08 LAB — HEMOGLOBIN A1C
Hgb A1c MFr Bld: 7.3 % of total Hgb — ABNORMAL HIGH (ref <5.7)
Mean Plasma Glucose: 163 (calc)
eAG (mmol/L): 9 (calc)

## 2019-01-13 ENCOUNTER — Encounter: Payer: Self-pay | Admitting: Nurse Practitioner

## 2019-01-15 ENCOUNTER — Encounter (HOSPITAL_COMMUNITY)
Admission: RE | Admit: 2019-01-15 | Discharge: 2019-01-15 | Disposition: A | Discharge status: Home / Self Care | Payer: Medicare Other | Source: Ambulatory Visit | Attending: Nephrology | Admitting: Nephrology

## 2019-01-15 ENCOUNTER — Other Ambulatory Visit: Payer: Self-pay

## 2019-01-15 ENCOUNTER — Encounter (HOSPITAL_COMMUNITY): Payer: Self-pay

## 2019-01-15 DIAGNOSIS — D631 Anemia in chronic kidney disease: Secondary | ICD-10-CM | POA: Insufficient documentation

## 2019-01-15 DIAGNOSIS — N184 Chronic kidney disease, stage 4 (severe): Secondary | ICD-10-CM | POA: Diagnosis not present

## 2019-01-15 LAB — CBC WITH DIFFERENTIAL/PLATELET
Abs Immature Granulocytes: 0.01 10*3/uL (ref 0.00–0.07)
Basophils Absolute: 0 10*3/uL (ref 0.0–0.1)
Basophils Relative: 1 %
Eosinophils Absolute: 0.2 10*3/uL (ref 0.0–0.5)
Eosinophils Relative: 5 %
HCT: 28.5 % — ABNORMAL LOW (ref 39.0–52.0)
Hemoglobin: 9.1 g/dL — ABNORMAL LOW (ref 13.0–17.0)
Immature Granulocytes: 0 %
Lymphocytes Relative: 17 %
Lymphs Abs: 0.8 10*3/uL (ref 0.7–4.0)
MCH: 31.1 pg (ref 26.0–34.0)
MCHC: 31.9 g/dL (ref 30.0–36.0)
MCV: 97.3 fL (ref 80.0–100.0)
Monocytes Absolute: 0.6 10*3/uL (ref 0.1–1.0)
Monocytes Relative: 14 %
Neutro Abs: 2.8 10*3/uL (ref 1.7–7.7)
Neutrophils Relative %: 63 %
Platelets: 184 10*3/uL (ref 150–400)
RBC: 2.93 MIL/uL — ABNORMAL LOW (ref 4.22–5.81)
RDW: 14.6 % (ref 11.5–15.5)
WBC: 4.4 10*3/uL (ref 4.0–10.5)
nRBC: 0 % (ref 0.0–0.2)

## 2019-01-15 LAB — RENAL FUNCTION PANEL
Albumin: 3.4 g/dL — ABNORMAL LOW (ref 3.5–5.0)
Anion gap: 12 (ref 5–15)
BUN: 140 mg/dL — ABNORMAL HIGH (ref 8–23)
CO2: 24 mmol/L (ref 22–32)
Calcium: 9.5 mg/dL (ref 8.9–10.3)
Chloride: 103 mmol/L (ref 98–111)
Creatinine, Ser: 6.95 mg/dL — ABNORMAL HIGH (ref 0.61–1.24)
GFR calc Af Amer: 8 mL/min — ABNORMAL LOW
GFR calc non Af Amer: 7 mL/min — ABNORMAL LOW
Glucose, Bld: 184 mg/dL — ABNORMAL HIGH (ref 70–99)
Phosphorus: 4.7 mg/dL — ABNORMAL HIGH (ref 2.5–4.6)
Potassium: 4.2 mmol/L (ref 3.5–5.1)
Sodium: 139 mmol/L (ref 135–145)

## 2019-01-15 LAB — FERRITIN: Ferritin: 291 ng/mL (ref 24–336)

## 2019-01-15 LAB — POCT HEMOGLOBIN-HEMACUE: Hemoglobin: 9.1 g/dL — ABNORMAL LOW (ref 13.0–17.0)

## 2019-01-15 LAB — IRON AND TIBC
Iron: 33 ug/dL — ABNORMAL LOW (ref 45–182)
Saturation Ratios: 11 % — ABNORMAL LOW (ref 17.9–39.5)
TIBC: 313 ug/dL (ref 250–450)
UIBC: 280 ug/dL

## 2019-01-15 MED ORDER — EPOETIN ALFA-EPBX 10000 UNIT/ML IJ SOLN
INTRAMUSCULAR | Status: AC
  Filled 2019-01-15: qty 2

## 2019-01-15 MED ORDER — EPOETIN ALFA-EPBX 10000 UNIT/ML IJ SOLN
20000.0000 [IU] | Freq: Once | INTRAMUSCULAR | Status: AC
  Administered 2019-01-15: 20000 [IU] via SUBCUTANEOUS

## 2019-01-19 ENCOUNTER — Telehealth: Payer: Self-pay | Admitting: *Deleted

## 2019-01-19 NOTE — Telephone Encounter (Signed)
Call to patient with arrival time change for 01/28/2019 surgery. Instructed to be at [facility] admitting at 12:30 pm, or as directed by the hospital. Verbalized understanding.

## 2019-01-26 ENCOUNTER — Other Ambulatory Visit: Payer: Self-pay | Admitting: Cardiology

## 2019-01-26 ENCOUNTER — Other Ambulatory Visit (HOSPITAL_COMMUNITY)
Admission: RE | Admit: 2019-01-26 | Discharge: 2019-01-26 | Disposition: A | Discharge status: Home / Self Care | Payer: Medicare Other | Source: Ambulatory Visit | Attending: Surgery | Admitting: Surgery

## 2019-01-26 DIAGNOSIS — Z01812 Encounter for preprocedural laboratory examination: Secondary | ICD-10-CM | POA: Diagnosis present

## 2019-01-26 DIAGNOSIS — Z20828 Contact with and (suspected) exposure to other viral communicable diseases: Secondary | ICD-10-CM | POA: Insufficient documentation

## 2019-01-26 LAB — SARS CORONAVIRUS 2 (TAT 6-24 HRS): SARS Coronavirus 2: NEGATIVE

## 2019-01-27 ENCOUNTER — Encounter (HOSPITAL_COMMUNITY): Payer: Self-pay | Admitting: Surgery

## 2019-01-27 ENCOUNTER — Other Ambulatory Visit: Payer: Self-pay

## 2019-01-27 ENCOUNTER — Inpatient Hospital Stay (HOSPITAL_COMMUNITY): Payer: Medicare Other

## 2019-01-27 NOTE — [place] Notes (Signed)
Mr Rekowski denies chest pain or shortness. Patient tested negative for Covid and has been in quarantine with family. Mr Masini had type II diabetes , patient reports that CBGs run in the 128- 138.  I instructed patient to take 1/2 of Levemir dose- 7 units in am if CBG is greater than 70. I instructed patient to check CBG after awaking and every 2 hours until arrival  to the hospital.  I Instructed patient if CBG is less than 70 to drink 1/2 cup of a clear juice. Recheck CBG in 15 minutes then call pre- op desk at [phone] for further instructions. If scheduled to receive Insulin, do not take Insulin.

## 2019-01-28 ENCOUNTER — Ambulatory Visit (HOSPITAL_COMMUNITY): Payer: Medicare Other | Admitting: Certified Registered"

## 2019-01-28 ENCOUNTER — Encounter (HOSPITAL_COMMUNITY): Payer: Self-pay | Admitting: Surgery

## 2019-01-28 ENCOUNTER — Ambulatory Visit (HOSPITAL_COMMUNITY)
Admission: RE | Admit: 2019-01-28 | Discharge: 2019-01-28 | Disposition: A | Discharge status: Home / Self Care | Payer: Medicare Other | Attending: Surgery | Admitting: Surgery

## 2019-01-28 ENCOUNTER — Encounter (HOSPITAL_COMMUNITY)
Admission: RE | Disposition: A | Discharge status: Home / Self Care | Payer: Self-pay | Source: Home / Self Care | Attending: Surgery

## 2019-01-28 DIAGNOSIS — I5042 Chronic combined systolic (congestive) and diastolic (congestive) heart failure: Secondary | ICD-10-CM | POA: Diagnosis not present

## 2019-01-28 DIAGNOSIS — I252 Old myocardial infarction: Secondary | ICD-10-CM | POA: Insufficient documentation

## 2019-01-28 DIAGNOSIS — E1122 Type 2 diabetes mellitus with diabetic chronic kidney disease: Secondary | ICD-10-CM | POA: Diagnosis not present

## 2019-01-28 DIAGNOSIS — G4733 Obstructive sleep apnea (adult) (pediatric): Secondary | ICD-10-CM | POA: Diagnosis not present

## 2019-01-28 DIAGNOSIS — Z87891 Personal history of nicotine dependence: Secondary | ICD-10-CM | POA: Diagnosis not present

## 2019-01-28 DIAGNOSIS — Z79899 Other long term (current) drug therapy: Secondary | ICD-10-CM | POA: Insufficient documentation

## 2019-01-28 DIAGNOSIS — D631 Anemia in chronic kidney disease: Secondary | ICD-10-CM | POA: Diagnosis not present

## 2019-01-28 DIAGNOSIS — Z6828 Body mass index (BMI) 28.0-28.9, adult: Secondary | ICD-10-CM | POA: Insufficient documentation

## 2019-01-28 DIAGNOSIS — I2510 Atherosclerotic heart disease of native coronary artery without angina pectoris: Secondary | ICD-10-CM | POA: Insufficient documentation

## 2019-01-28 DIAGNOSIS — N185 Chronic kidney disease, stage 5: Secondary | ICD-10-CM | POA: Insufficient documentation

## 2019-01-28 DIAGNOSIS — Z7982 Long term (current) use of aspirin: Secondary | ICD-10-CM | POA: Diagnosis not present

## 2019-01-28 DIAGNOSIS — I132 Hypertensive heart and chronic kidney disease with heart failure and with stage 5 chronic kidney disease, or end stage renal disease: Secondary | ICD-10-CM | POA: Diagnosis not present

## 2019-01-28 DIAGNOSIS — Z794 Long term (current) use of insulin: Secondary | ICD-10-CM | POA: Insufficient documentation

## 2019-01-28 DIAGNOSIS — K219 Gastro-esophageal reflux disease without esophagitis: Secondary | ICD-10-CM | POA: Insufficient documentation

## 2019-01-28 DIAGNOSIS — E785 Hyperlipidemia, unspecified: Secondary | ICD-10-CM | POA: Diagnosis not present

## 2019-01-28 HISTORY — PX: BASCILIC VEIN TRANSPOSITION: SHX5742

## 2019-01-28 HISTORY — DX: Personal history of other medical treatment: Z92.89

## 2019-01-28 HISTORY — DX: Dyspnea, unspecified: R06.00

## 2019-01-28 LAB — POCT I-STAT, CHEM 8
BUN: 136 mg/dL — ABNORMAL HIGH (ref 8–23)
Calcium, Ion: 1.12 mmol/L — ABNORMAL LOW (ref 1.15–1.40)
Chloride: 100 mmol/L (ref 98–111)
Creatinine, Ser: 6.6 mg/dL — ABNORMAL HIGH (ref 0.61–1.24)
Glucose, Bld: 123 mg/dL — ABNORMAL HIGH (ref 70–99)
HCT: 30 % — ABNORMAL LOW (ref 39.0–52.0)
Hemoglobin: 10.2 g/dL — ABNORMAL LOW (ref 13.0–17.0)
Potassium: 3.8 mmol/L (ref 3.5–5.1)
Sodium: 137 mmol/L (ref 135–145)
TCO2: 25 mmol/L (ref 22–32)

## 2019-01-28 LAB — GLUCOSE, CAPILLARY
Glucose-Capillary: 125 mg/dL — ABNORMAL HIGH (ref 70–99)
Glucose-Capillary: 132 mg/dL — ABNORMAL HIGH (ref 70–99)

## 2019-01-28 SURGERY — TRANSPOSITION, VEIN, BASILIC
Anesthesia: General | Site: Arm Upper | Laterality: Left

## 2019-01-28 MED ORDER — HYDROCODONE-ACETAMINOPHEN 5-325 MG PO TABS: 1.0000 | ORAL_TABLET | Freq: Four times a day (QID) | ORAL | 0 refills | Status: DC | PRN

## 2019-01-28 MED ORDER — PHENYLEPHRINE 40 MCG/ML (10ML) SYRINGE FOR IV PUSH (FOR BLOOD PRESSURE SUPPORT)
PREFILLED_SYRINGE | INTRAVENOUS | Status: DC | PRN
  Administered 2019-01-28: 80 ug via INTRAVENOUS

## 2019-01-28 MED ORDER — FENTANYL CITRATE (PF) 250 MCG/5ML IJ SOLN
INTRAMUSCULAR | Status: AC
  Filled 2019-01-28: qty 5

## 2019-01-28 MED ORDER — SODIUM CHLORIDE 0.9 % IV SOLN: INTRAVENOUS | Status: DC

## 2019-01-28 MED ORDER — CHLORHEXIDINE GLUCONATE 4 % EX LIQD: 60.0000 mL | Freq: Once | CUTANEOUS | Status: DC

## 2019-01-28 MED ORDER — DEXAMETHASONE SODIUM PHOSPHATE 10 MG/ML IJ SOLN
INTRAMUSCULAR | Status: DC | PRN
  Administered 2019-01-28: 4 mg via INTRAVENOUS

## 2019-01-28 MED ORDER — LIDOCAINE HCL (CARDIAC) PF 100 MG/5ML IV SOSY
PREFILLED_SYRINGE | INTRAVENOUS | Status: DC | PRN
  Administered 2019-01-28: 40 mg via INTRAVENOUS

## 2019-01-28 MED ORDER — PROPOFOL 10 MG/ML IV BOLUS
INTRAVENOUS | Status: DC | PRN
  Administered 2019-01-28: 70 mg via INTRAVENOUS
  Administered 2019-01-28 (×2): 20 mg via INTRAVENOUS

## 2019-01-28 MED ORDER — SODIUM CHLORIDE 0.9 % IV SOLN
INTRAVENOUS | Status: DC | PRN
  Administered 2019-01-28: 500 mL

## 2019-01-28 MED ORDER — STERILE WATER FOR IRRIGATION IR SOLN
Status: DC | PRN
  Administered 2019-01-28: 1000 mL

## 2019-01-28 MED ORDER — SODIUM CHLORIDE 0.9 % IV SOLN
INTRAVENOUS | Status: AC
  Filled 2019-01-28: qty 1.2

## 2019-01-28 MED ORDER — CEFAZOLIN SODIUM-DEXTROSE 2-4 GM/100ML-% IV SOLN
2.0000 g | INTRAVENOUS | Status: AC
  Administered 2019-01-28: 2 g via INTRAVENOUS
  Filled 2019-01-28: qty 100

## 2019-01-28 MED ORDER — ONDANSETRON HCL 4 MG/2ML IJ SOLN
INTRAMUSCULAR | Status: DC | PRN
  Administered 2019-01-28: 4 mg via INTRAVENOUS

## 2019-01-28 MED ORDER — FENTANYL CITRATE (PF) 100 MCG/2ML IJ SOLN: 25.0000 ug | INTRAMUSCULAR | Status: DC | PRN

## 2019-01-28 MED ORDER — PROPOFOL 10 MG/ML IV BOLUS
INTRAVENOUS | Status: AC
  Filled 2019-01-28: qty 20

## 2019-01-28 MED ORDER — 0.9 % SODIUM CHLORIDE (POUR BTL) OPTIME
TOPICAL | Status: DC | PRN
  Administered 2019-01-28: 1000 mL

## 2019-01-28 MED ORDER — ONDANSETRON HCL 4 MG/2ML IJ SOLN: 4.0000 mg | Freq: Once | INTRAMUSCULAR | Status: DC | PRN

## 2019-01-28 MED ORDER — PHENYLEPHRINE HCL-NACL 10-0.9 MG/250ML-% IV SOLN
INTRAVENOUS | Status: DC | PRN
  Administered 2019-01-28: 25 ug/min via INTRAVENOUS

## 2019-01-28 MED ORDER — FENTANYL CITRATE (PF) 250 MCG/5ML IJ SOLN
INTRAMUSCULAR | Status: DC | PRN
  Administered 2019-01-28 (×3): 25 ug via INTRAVENOUS

## 2019-01-28 SURGICAL SUPPLY — 36 items
ADH SKN CLS APL DERMABOND .7 (GAUZE/BANDAGES/DRESSINGS) ×1
ARMBAND PINK RESTRICT EXTREMIT (MISCELLANEOUS) ×2 IMPLANT
BNDG ELASTIC 4X5.8 VLCR STR LF (GAUZE/BANDAGES/DRESSINGS) ×2 IMPLANT
CANISTER SUCT 3000ML PPV (MISCELLANEOUS) ×2 IMPLANT
CLIP VESOCCLUDE MED 24/CT (CLIP) IMPLANT
CLIP VESOCCLUDE MED 6/CT (CLIP) IMPLANT
CLIP VESOCCLUDE SM WIDE 24/CT (CLIP) IMPLANT
CLIP VESOCCLUDE SM WIDE 6/CT (CLIP) IMPLANT
COVER PROBE W GEL 5X96 (DRAPES) ×2 IMPLANT
COVER WAND RF STERILE (DRAPES) ×2 IMPLANT
DERMABOND ADVANCED (GAUZE/BANDAGES/DRESSINGS) ×1
DERMABOND ADVANCED .7 DNX12 (GAUZE/BANDAGES/DRESSINGS) ×1 IMPLANT
ELECT REM PT RETURN 9FT ADLT (ELECTROSURGICAL) ×2
ELECTRODE REM PT RTRN 9FT ADLT (ELECTROSURGICAL) ×1 IMPLANT
GLOVE BIOGEL PI IND STRL 7.5 (GLOVE) ×1 IMPLANT
GLOVE BIOGEL PI INDICATOR 7.5 (GLOVE) ×1
GLOVE SURG SS PI 7.5 STRL IVOR (GLOVE) ×2 IMPLANT
GOWN STRL REUS W/ TWL LRG LVL3 (GOWN DISPOSABLE) ×2 IMPLANT
GOWN STRL REUS W/ TWL XL LVL3 (GOWN DISPOSABLE) ×1 IMPLANT
GOWN STRL REUS W/TWL LRG LVL3 (GOWN DISPOSABLE) ×4
GOWN STRL REUS W/TWL XL LVL3 (GOWN DISPOSABLE) ×2
HEMOSTAT SNOW SURGICEL 2X4 (HEMOSTASIS) IMPLANT
KIT BASIN OR (CUSTOM PROCEDURE TRAY) ×2 IMPLANT
KIT TURNOVER KIT B (KITS) ×2 IMPLANT
MARKER SKIN DUAL TIP RULER LAB (MISCELLANEOUS) ×1 IMPLANT
NS IRRIG 1000ML POUR BTL (IV SOLUTION) ×2 IMPLANT
PACK CV ACCESS (CUSTOM PROCEDURE TRAY) ×2 IMPLANT
PAD ARMBOARD 7.5X6 YLW CONV (MISCELLANEOUS) ×4 IMPLANT
SUT PROLENE 6 0 CC (SUTURE) ×2 IMPLANT
SUT SILK 2 0 SH (SUTURE) IMPLANT
SUT VIC AB 3-0 SH 27 (SUTURE) ×2
SUT VIC AB 3-0 SH 27X BRD (SUTURE) ×1 IMPLANT
SUT VICRYL 4-0 PS2 18IN ABS (SUTURE) ×2 IMPLANT
TOWEL GREEN STERILE (TOWEL DISPOSABLE) ×2 IMPLANT
UNDERPAD 30X30 (UNDERPADS AND DIAPERS) ×2 IMPLANT
WATER STERILE IRR 1000ML POUR (IV SOLUTION) ×2 IMPLANT

## 2019-01-28 NOTE — Transfer of Care (Signed)
Immediate Anesthesia Transfer of Care Note  Patient: Henry Carroll  Procedure(s) Performed: BASILIC VEIN TRANSPOSITION SECOND STAGE (Left Arm Upper)  Patient Location: PACU  Anesthesia Type:General  Level of Consciousness: awake  Airway & Oxygen Therapy: Patient Spontanous Breathing  Post-op Assessment: Report given to RN  Post vital signs: Reviewed and stable  Last Vitals:  Vitals Value Taken Time  BP 141/70 01/28/19 1523  Temp    Pulse 62 01/28/19 1524  Resp 16 01/28/19 1524  SpO2 93 % 01/28/19 1524  Vitals shown include unvalidated device data.  Last Pain:  Vitals:   01/28/19 1140  TempSrc: Oral         Complications: No apparent anesthesia complications

## 2019-01-28 NOTE — Op Note (Signed)
    Patient name: Henry Carroll MRN: [id number] DOB: 11/16/1950 Sex: male  01/28/2019 Pre-operative Diagnosis: CKD 5 Post-operative diagnosis:  Same Surgeon:  Annamarie Major Assistants:  [place] Procedure:   2nd stage left basilic vein fistula Anesthesia:  General Blood Loss:  50 cc Specimens:  none  Findings:  5-7 mm vein.  Venous anastamosis near [facility], end to end  Indications:  68 yo s/p 1 st stage BVT, now back for 2nd stage.  Procedure:  The patient was identified in the holding area and taken to [place] 11  The patient was then placed supine on the table. general anesthesia was administered.  The patient was prepped and draped in the usual sterile fashion.  A time out was called and antibiotics were administered.  Ultrasound was used to evaluate the basilic vein in the left arm.  It was of excellent caliber measuring 5 x 7 mm.  2 longitudinal incisions were made in the upper arm.  Through these incisions the vein was fully mobilized.  All side branches were ligated between silk ties.  Once the vein was mobilized from the antecubital crease up to the axilla, it was marked for orientation.  I then created a subcutaneous tunnel using a curved Gore tunneler.  Baby Gregory clamps were placed in the axilla and at the antecubital crease on the vein.  The vein was transected near the antecubital crease.  It was then brought through the previously created tunnel making sure to maintain proper orientation.  A end to end anastomosis was then created with 6-0 Prolene.  Prior to completion the appropriate flushing maneuvers were performed and the anastomosis was completed.  The clamps were released.  There was an excellent thrill within the fistula.  The patient had a brisk radial Doppler signal.  The wounds were then irrigated.  The deep tissue was then reapproximated 3-0 Vicryl and the skin was closed with 4-0 Vicryl followed by Dermabond.  A Ace wrap was placed on the arm.  There were no immediate  complications.  The patient was successfully extubated and taken to recovery in stable condition.   Disposition: To PACU stable.   Theotis Burrow, M.D., [facility] [place] Office: [phone] Pager:  [phone]

## 2019-01-28 NOTE — Anesthesia Procedure Notes (Signed)
Procedure Name: LMA Insertion Date/Time: 01/28/2019 1:44 PM Performed by: Barrington Ellison, CRNA Pre-anesthesia Checklist: Patient identified, Emergency Drugs available, Suction available and Patient being monitored Patient Re-evaluated:Patient Re-evaluated prior to induction Oxygen Delivery Method: Circle System Utilized Preoxygenation: Pre-oxygenation with 100% oxygen Induction Type: IV induction Ventilation: Mask ventilation without difficulty LMA: LMA inserted LMA Size: 5.0 Number of attempts: 1 Placement Confirmation: positive ETCO2 Tube secured with: Tape Dental Injury: Teeth and Oropharynx as per pre-operative assessment

## 2019-01-28 NOTE — H&P (Signed)
POST OPERATIVE OFFICE NOTE    CC:  F/u for surgery  HPI:  This is a 68 y.o. male who is s/p First stage left basilic vein fistula 32/10/5186 by Dr. Trula Slade..  He denise pain, numbness or loss of motor in the left UE.  He is not yet on HD.  No Known Allergies        Current Outpatient Medications  Medication Sig Dispense Refill  . acetaminophen (TYLENOL) 500 MG tablet Take 1,000 mg by mouth 2 (two) times daily as needed for moderate pain.    Marland Kitchen albuterol (PROVENTIL) (2.5 MG/3ML) 0.083% nebulizer solution USE ONE VIAL IN NEBULIZER EVERY 6 HOURS AS NEEDED FOR WHEEZING FOR SHORTNESS OF BREATH (Patient taking differently: Take 2.5 mg by nebulization every 4 (four) hours as needed for shortness of breath. ) 300 mL 3  . amLODipine (NORVASC) 10 MG tablet Take 1 tablet (10 mg total) by mouth daily. 90 tablet 1  . aspirin EC 81 MG tablet Take 1 tablet (81 mg total) by mouth daily. 90 tablet 3  . atorvastatin (LIPITOR) 40 MG tablet TAKE 1 TABLET BY MOUTH ONCE DAILY IN THE EVENING (Patient taking differently: Take 40 mg by mouth daily. ) 90 tablet 2  . carvedilol (COREG) 25 MG tablet Take 25 mg by mouth 2 (two) times daily with a meal.    . Cholecalciferol (VITAMIN D3) 50 MCG (2000 UT) TABS Take 4,000 Units by mouth daily.    . Cinnamon 500 MG capsule Take 2,000 mg by mouth daily.     . Coenzyme Q10 (CO Q 10 PO) Take 200 mg by mouth daily.     Marland Kitchen epoetin alfa-epbx (RETACRIT) 41660 UNIT/ML injection 20,000 Units every 14 (fourteen) days.     . fluticasone (FLONASE) 50 MCG/ACT nasal spray Place 2 sprays into both nostrils daily as needed for allergies or rhinitis (SEASONAL ALLERGIES).    . Fluticasone-Salmeterol (ADVAIR) 500-50 MCG/DOSE AEPB Inhale 1 puff into the lungs 2 (two) times daily as needed (respiratory issues.).    Marland Kitchen furosemide (LASIX) 40 MG tablet Take 80 mg by mouth 2 (two) times daily.    Marland Kitchen glucose blood (ONE TOUCH ULTRA TEST) test strip Use to test blood sugar three  times daily. Dx: E11.21 900 each 1  . hydrALAZINE (APRESOLINE) 100 MG tablet Take 100 mg by mouth 2 (two) times daily.    . insulin detemir (LEVEMIR) 100 UNIT/ML injection Inject 30 Units into the skin every other day. In the morning    . isosorbide mononitrate (IMDUR) 30 MG 24 hr tablet Take 1 tablet (30 mg total) by mouth daily. 90 tablet 3  . metolazone (ZAROXOLYN) 5 MG tablet Take 5 mg by mouth 2 (two) times a week. Tuesday and Saturday only     . montelukast (SINGULAIR) 10 MG tablet Take 1 tablet (10 mg total) by mouth at bedtime. 90 tablet 1  . Multiple Vitamin (MULTIVITAMIN WITH MINERALS) TABS tablet Take 1 tablet by mouth daily.    . nitroGLYCERIN (NITROSTAT) 0.4 MG SL tablet Place 1 tablet (0.4 mg total) under the tongue every 5 (five) minutes as needed for chest pain. 25 tablet 3  . PROAIR HFA 108 (90 Base) MCG/ACT inhaler INHALE 2 PUFFS INTO LUNGS EVERY 6 HOURS AS NEEDED FOR SHORTNESS OF BREATH (Patient taking differently: Inhale 2 puffs into the lungs every 6 (six) hours as needed for shortness of breath. ) 18 g 1  . HYDROcodone-acetaminophen (NORCO) 5-325 MG tablet Take 1 tablet by mouth every  6 (six) hours as needed for moderate pain. 12 tablet 0   No current facility-administered medications for this visit.             Facility-Administered Medications Ordered in Other Visits  Medication Dose Route Frequency Provider Last Rate Last Dose  . regadenoson (LEXISCAN) injection SOLN 0.4 mg  0.4 mg Intravenous Once Jerline Pain, MD         ROS:  See HPI  Physical Exam:  Findings: +--------------------+----------+-----------------+--------+ AVF PSV (cm/s)Flow Vol (mL/min)Comments +--------------------+----------+-----------------+--------+ Native artery inflow 216  1096   +--------------------+----------+-----------------+--------+ AVF Anastomosis  552     +--------------------+----------+-----------------+--------+   +------------+----------+-------------+----------+-----------------------------+ OUTFLOW VEINPSV (cm/s)Diameter (cm)Depth (cm) Describe  +------------+----------+-------------+----------+-----------------------------+ Prox UA  116  1.07  0.89   +------------+----------+-------------+----------+-----------------------------+ Mid UA  281  0.69  0.71  Retained valve  +------------+----------+-------------+----------+-----------------------------+ Dist UA  426   Retained valve and competing       branch 0.44  +------------+----------+-------------+----------+-----------------------------+ AC Fossa  823  0.38  0.72   +------------+----------+-------------+----------+-----------------------------+      Vitals:   12/26/18 1510  BP: (!) 141/71  Pulse: 64  Resp: 16  Temp: (!) 97.4 F (36.3 C)  SpO2: 98%      Incision:  Well healed left AC incision. Extremities:  Grip 5/5 and sensation intact   Assessment/Plan:  This is a 68 y.o. male who is s/p: Left first stage basilic AV fistula.  The fistula is deeper than 0.6 cm and the diameter is developing > 0.6 cm. He is here for his second stage. All questions answered.  Henry Carroll

## 2019-01-28 NOTE — Discharge Instructions (Signed)
° °  [facility] ° °Discharge Instructions ° °AV Fistula or Graft Surgery for Dialysis Access ° °Please refer to the following instructions for your post-procedure care. Your surgeon or physician assistant will discuss any changes with you. ° °Activity ° °You may drive the day following your surgery, if you are comfortable and no longer taking prescription pain medication. Resume full activity as the soreness in your incision resolves. ° °Bathing/Showering ° °You may shower after you go home. Keep your incision dry for 48 hours. Do not soak in a bathtub, hot tub, or swim until the incision heals completely. You may not shower if you have a hemodialysis catheter. ° °Incision Care ° °Clean your incision with mild soap and water after 48 hours. Pat the area dry with a clean towel. You do not need a bandage unless otherwise instructed. Do not apply any ointments or creams to your incision. You may have skin glue on your incision. Do not peel it off. It will come off on its own in about one week. Your arm may swell a bit after surgery. To reduce swelling use pillows to elevate your arm so it is above your heart. Your doctor will tell you if you need to lightly wrap your arm with an ACE bandage. ° °Diet ° °Resume your normal diet. There are not special food restrictions following this procedure. In order to heal from your surgery, it is CRITICAL to get adequate nutrition. Your body requires vitamins, minerals, and protein. Vegetables are the best source of vitamins and minerals. Vegetables also provide the perfect balance of protein. Processed food has little nutritional value, so try to avoid this. ° °Medications ° °Resume taking all of your medications. If your incision is causing pain, you may take over-the counter pain relievers such as acetaminophen (Tylenol). If you were prescribed a stronger pain medication, please be aware these medications can cause nausea and constipation. Prevent  nausea by taking the medication with a snack or meal. Avoid constipation by drinking plenty of fluids and eating foods with high amount of fiber, such as fruits, vegetables, and grains. Do not take Tylenol if you are taking prescription pain medications. ° ° ° ° °Follow up °Your surgeon may want to see you in the office following your access surgery. If so, this will be arranged at the time of your surgery. ° °Please call us immediately for any of the following conditions: ° °Increased pain, redness, drainage (pus) from your incision site °Fever of 101 degrees or higher °Severe or worsening pain at your incision site °Hand pain or numbness. ° °Reduce your risk of vascular disease: ° °Stop smoking. If you would like help, call [facility] at 1-800-QUIT-NOW (1-800-784-8669) or [facility] at 336-586-4000 ° °Manage your cholesterol °Maintain a desired weight °Control your diabetes °Keep your blood pressure down ° °Dialysis ° °It will take several weeks to several months for your new dialysis access to be ready for use. Your surgeon will determine when it is OK to use it. Your nephrologist will continue to direct your dialysis. You can continue to use your Permcath until your new access is ready for use. ° °If you have any questions, please call the office at [phone]. ° °

## 2019-01-28 NOTE — Anesthesia Preprocedure Evaluation (Signed)
Anesthesia Evaluation  Patient identified by MRN, date of birth, ID band Patient awake    Reviewed: Allergy & Precautions, NPO status , Patient's Chart, lab work & pertinent test results, reviewed documented beta blocker date and time   History of Anesthesia Complications Negative for: history of anesthetic complications  Airway Mallampati: III  TM Distance: >3 FB Neck ROM: Full    Dental  (+) Partial Lower, Upper Dentures   Pulmonary shortness of breath, asthma , sleep apnea , neg recent URI, former smoker,    breath sounds clear to auscultation       Cardiovascular hypertension, Pt. on medications and Pt. on home beta blockers + CAD, + CABG, + Peripheral Vascular Disease and +CHF   Rhythm:Regular   1. Left ventricular ejection fraction, by visual estimation, is 40 to 45%. The left ventricle has mild to moderately decreased function. Normal left ventricular size. There is mildly increased left ventricular hypertrophy. Severe hypokinesis of the  basal to mid inferior and inferolateral walls.  2. The mitral valve is normal in structure. Trace mitral valve regurgitation. No evidence of mitral stenosis.  3. The tricuspid valve is normal in structure. Tricuspid valve regurgitation is trivial.  4. The aortic valve is tricuspid Aortic valve regurgitation was not visualized by color flow Doppler. Mild aortic valve sclerosis without stenosis.  5. 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  6. Left atrial size was mildly dilated.  7. Left ventricular diastolic Doppler parameters are consistent with pseudonormalization pattern of LV diastolic filling.  8. Right atrial size was mildly dilated.  9. Global right ventricle has mildly reduced systolic function.The right ventricular size is mildly enlarged. No increase in right ventricular wall thickness. 10. The inferior vena cava is normal in size with greater than 50%  respiratory variability, suggesting right atrial pressure of 3 mmHg. 11. Trivial pericardial effusion is present. 12. The tricuspid regurgitant velocity is 2.90 m/s, and with an assumed right atrial pressure of 3 mmHg, the estimated right ventricular systolic pressure is mildly elevated at 36.6 mmHg.   Neuro/Psych negative neurological ROS  negative psych ROS   GI/Hepatic Neg liver ROS, PUD, GERD  ,  Endo/Other  diabetes  Renal/GU CRFRenal disease     Musculoskeletal  (+) Arthritis ,   Abdominal   Peds  Hematology  (+) Blood dyscrasia, anemia ,   Anesthesia Other Findings   Reproductive/Obstetrics                             Anesthesia Physical Anesthesia Plan  ASA: IV  Anesthesia Plan: General   Post-op Pain Management:    Induction: Intravenous  PONV Risk Score and Plan: 2 and Ondansetron and Dexamethasone  Airway Management Planned: LMA  Additional Equipment: None  Intra-op Plan:   Post-operative Plan: Extubation in OR  Informed Consent: I have reviewed the patients History and Physical, chart, labs and discussed the procedure including the risks, benefits and alternatives for the proposed anesthesia with the patient or authorized representative who has indicated his/her understanding and acceptance.     Dental advisory given  Plan Discussed with: CRNA and Surgeon  Anesthesia Plan Comments:         Anesthesia Quick Evaluation

## 2019-01-29 ENCOUNTER — Encounter: Payer: Self-pay | Admitting: *Deleted

## 2019-01-29 ENCOUNTER — Other Ambulatory Visit: Payer: Self-pay | Admitting: Cardiology

## 2019-01-29 NOTE — Anesthesia Postprocedure Evaluation (Signed)
Anesthesia Post Note  Patient: Henry Carroll  Procedure(s) Performed: BASILIC VEIN TRANSPOSITION SECOND STAGE (Left Arm Upper)     Patient location during evaluation: PACU Anesthesia Type: General Level of consciousness: awake and alert Pain management: pain level controlled Vital Signs Assessment: post-procedure vital signs reviewed and stable Respiratory status: spontaneous breathing, nonlabored ventilation, respiratory function stable and patient connected to nasal cannula oxygen Cardiovascular status: blood pressure returned to baseline and stable Postop Assessment: no apparent nausea or vomiting Anesthetic complications: no    Last Vitals:  Vitals:   01/28/19 1525 01/28/19 1540  BP: (!) 141/70 139/67  Pulse: 62 62  Resp: 16 16  Temp: 36.5 C 36.7 C  SpO2: 93% 92%    Last Pain:  Vitals:   01/28/19 1525  TempSrc:   PainSc: 0-No pain                 Timber Marshman

## 2019-02-02 ENCOUNTER — Encounter (HOSPITAL_COMMUNITY)
Admission: RE | Admit: 2019-02-02 | Discharge: 2019-02-02 | Disposition: A | Discharge status: Home / Self Care | Payer: Medicare Other | Source: Ambulatory Visit | Attending: Nephrology | Admitting: Nephrology

## 2019-02-02 ENCOUNTER — Encounter (HOSPITAL_COMMUNITY): Payer: Medicare Other

## 2019-02-03 ENCOUNTER — Other Ambulatory Visit: Payer: Self-pay | Admitting: Cardiology

## 2019-02-03 ENCOUNTER — Ambulatory Visit (HOSPITAL_COMMUNITY): Payer: Medicare Other | Admitting: Hematology

## 2019-02-03 NOTE — Telephone Encounter (Signed)
Clarified with patient that he is taking Furosemide 40 mg (2) tablets twice a day.  Refill sent into pharmacy.

## 2019-02-09 ENCOUNTER — Encounter (HOSPITAL_COMMUNITY)
Admission: RE | Admit: 2019-02-09 | Discharge: 2019-02-09 | Disposition: A | Discharge status: Home / Self Care | Payer: Medicare Other | Source: Ambulatory Visit | Attending: Nephrology | Admitting: Nephrology

## 2019-02-09 ENCOUNTER — Other Ambulatory Visit: Payer: Self-pay

## 2019-02-09 ENCOUNTER — Encounter (HOSPITAL_COMMUNITY): Payer: Self-pay

## 2019-02-09 DIAGNOSIS — N184 Chronic kidney disease, stage 4 (severe): Secondary | ICD-10-CM | POA: Diagnosis not present

## 2019-02-09 LAB — CBC WITH DIFFERENTIAL/PLATELET
Abs Immature Granulocytes: 0.01 10*3/uL (ref 0.00–0.07)
Basophils Absolute: 0 10*3/uL (ref 0.0–0.1)
Basophils Relative: 1 %
Eosinophils Absolute: 0.2 10*3/uL (ref 0.0–0.5)
Eosinophils Relative: 5 %
HCT: 26 % — ABNORMAL LOW (ref 39.0–52.0)
Hemoglobin: 8.5 g/dL — ABNORMAL LOW (ref 13.0–17.0)
Immature Granulocytes: 0 %
Lymphocytes Relative: 16 %
Lymphs Abs: 0.7 10*3/uL (ref 0.7–4.0)
MCH: 30.9 pg (ref 26.0–34.0)
MCHC: 32.7 g/dL (ref 30.0–36.0)
MCV: 94.5 fL (ref 80.0–100.0)
Monocytes Absolute: 0.4 10*3/uL (ref 0.1–1.0)
Monocytes Relative: 10 %
Neutro Abs: 2.8 10*3/uL (ref 1.7–7.7)
Neutrophils Relative %: 68 %
Platelets: 193 10*3/uL (ref 150–400)
RBC: 2.75 MIL/uL — ABNORMAL LOW (ref 4.22–5.81)
RDW: 13.7 % (ref 11.5–15.5)
WBC: 4.1 10*3/uL (ref 4.0–10.5)
nRBC: 0 % (ref 0.0–0.2)

## 2019-02-09 LAB — IRON AND TIBC
Iron: 53 ug/dL (ref 45–182)
Saturation Ratios: 16 % — ABNORMAL LOW (ref 17.9–39.5)
TIBC: 338 ug/dL (ref 250–450)
UIBC: 285 ug/dL

## 2019-02-09 LAB — RENAL FUNCTION PANEL
Albumin: 3.4 g/dL — ABNORMAL LOW (ref 3.5–5.0)
Anion gap: 12 (ref 5–15)
BUN: 124 mg/dL — ABNORMAL HIGH (ref 8–23)
CO2: 25 mmol/L (ref 22–32)
Calcium: 9.2 mg/dL (ref 8.9–10.3)
Chloride: 100 mmol/L (ref 98–111)
Creatinine, Ser: 6.65 mg/dL — ABNORMAL HIGH (ref 0.61–1.24)
GFR calc Af Amer: 9 mL/min — ABNORMAL LOW (ref >60)
GFR calc non Af Amer: 8 mL/min — ABNORMAL LOW (ref >60)
Glucose, Bld: 256 mg/dL — ABNORMAL HIGH (ref 70–99)
Phosphorus: 4.4 mg/dL (ref 2.5–4.6)
Potassium: 4.1 mmol/L (ref 3.5–5.1)
Sodium: 137 mmol/L (ref 135–145)

## 2019-02-09 LAB — POCT HEMOGLOBIN-HEMACUE: Hemoglobin: 9 g/dL — ABNORMAL LOW (ref 13.0–17.0)

## 2019-02-09 LAB — MAGNESIUM: Magnesium: 2 mg/dL (ref 1.7–2.4)

## 2019-02-09 LAB — FERRITIN: Ferritin: 277 ng/mL (ref 24–336)

## 2019-02-09 MED ORDER — EPOETIN ALFA-EPBX 10000 UNIT/ML IJ SOLN
20000.0000 [IU] | Freq: Once | INTRAMUSCULAR | Status: AC
  Administered 2019-02-09: 20000 [IU] via SUBCUTANEOUS
  Filled 2019-02-09: qty 2

## 2019-02-10 ENCOUNTER — Inpatient Hospital Stay (HOSPITAL_COMMUNITY): Payer: Medicare Other | Attending: Hematology

## 2019-02-10 ENCOUNTER — Other Ambulatory Visit: Payer: Self-pay

## 2019-02-10 DIAGNOSIS — C9000 Multiple myeloma not having achieved remission: Secondary | ICD-10-CM | POA: Insufficient documentation

## 2019-02-10 LAB — COMPREHENSIVE METABOLIC PANEL
ALT: 13 U/L (ref 0–44)
AST: 21 U/L (ref 15–41)
Albumin: 3.9 g/dL (ref 3.5–5.0)
Alkaline Phosphatase: 44 U/L (ref 38–126)
Anion gap: 15 (ref 5–15)
BUN: 110 mg/dL — ABNORMAL HIGH (ref 8–23)
CO2: 22 mmol/L (ref 22–32)
Calcium: 9.3 mg/dL (ref 8.9–10.3)
Chloride: 98 mmol/L (ref 98–111)
Creatinine, Ser: 6.57 mg/dL — ABNORMAL HIGH (ref 0.61–1.24)
GFR calc Af Amer: 9 mL/min — ABNORMAL LOW (ref >60)
GFR calc non Af Amer: 8 mL/min — ABNORMAL LOW (ref >60)
Glucose, Bld: 135 mg/dL — ABNORMAL HIGH (ref 70–99)
Potassium: 4.2 mmol/L (ref 3.5–5.1)
Sodium: 135 mmol/L (ref 135–145)
Total Bilirubin: 0.6 mg/dL (ref 0.3–1.2)
Total Protein: 7 g/dL (ref 6.5–8.1)

## 2019-02-10 LAB — CBC WITH DIFFERENTIAL/PLATELET
Abs Immature Granulocytes: 0.01 10*3/uL (ref 0.00–0.07)
Basophils Absolute: 0 10*3/uL (ref 0.0–0.1)
Basophils Relative: 1 %
Eosinophils Absolute: 0.2 10*3/uL (ref 0.0–0.5)
Eosinophils Relative: 4 %
HCT: 28.6 % — ABNORMAL LOW (ref 39.0–52.0)
Hemoglobin: 9.1 g/dL — ABNORMAL LOW (ref 13.0–17.0)
Immature Granulocytes: 0 %
Lymphocytes Relative: 20 %
Lymphs Abs: 1 10*3/uL (ref 0.7–4.0)
MCH: 31 pg (ref 26.0–34.0)
MCHC: 31.8 g/dL (ref 30.0–36.0)
MCV: 97.3 fL (ref 80.0–100.0)
Monocytes Absolute: 0.6 10*3/uL (ref 0.1–1.0)
Monocytes Relative: 11 %
Neutro Abs: 3.2 10*3/uL (ref 1.7–7.7)
Neutrophils Relative %: 64 %
Platelets: 194 10*3/uL (ref 150–400)
RBC: 2.94 MIL/uL — ABNORMAL LOW (ref 4.22–5.81)
RDW: 13.7 % (ref 11.5–15.5)
WBC: 5.1 10*3/uL (ref 4.0–10.5)
nRBC: 0 % (ref 0.0–0.2)

## 2019-02-10 LAB — LACTATE DEHYDROGENASE: LDH: 262 U/L — ABNORMAL HIGH (ref 98–192)

## 2019-02-11 LAB — PROTEIN ELECTROPHORESIS, SERUM
A/G Ratio: 1.4 (ref 0.7–1.7)
Albumin ELP: 3.6 g/dL (ref 2.9–4.4)
Alpha-1-Globulin: 0.3 g/dL (ref 0.0–0.4)
Alpha-2-Globulin: 0.8 g/dL (ref 0.4–1.0)
Beta Globulin: 0.9 g/dL (ref 0.7–1.3)
Gamma Globulin: 0.7 g/dL (ref 0.4–1.8)
Globulin, Total: 2.6 g/dL (ref 2.2–3.9)
M-Spike, %: 0.4 g/dL — ABNORMAL HIGH
Total Protein ELP: 6.2 g/dL (ref 6.0–8.5)

## 2019-02-11 LAB — KAPPA/LAMBDA LIGHT CHAINS
Kappa free light chain: 150.9 mg/L — ABNORMAL HIGH (ref 3.3–19.4)
Kappa, lambda light chain ratio: 1.11 (ref 0.26–1.65)
Lambda free light chains: 135.5 mg/L — ABNORMAL HIGH (ref 5.7–26.3)

## 2019-02-17 ENCOUNTER — Encounter (HOSPITAL_COMMUNITY): Payer: Self-pay | Admitting: Hematology

## 2019-02-17 ENCOUNTER — Other Ambulatory Visit: Payer: Self-pay

## 2019-02-17 ENCOUNTER — Inpatient Hospital Stay (HOSPITAL_COMMUNITY): Payer: Medicare Other | Attending: Hematology | Admitting: Hematology

## 2019-02-17 VITALS — BP 151/71 | HR 66 | Temp 97.5°F | Resp 18 | Wt 210.6 lb

## 2019-02-17 DIAGNOSIS — N185 Chronic kidney disease, stage 5: Secondary | ICD-10-CM | POA: Diagnosis not present

## 2019-02-17 DIAGNOSIS — E1122 Type 2 diabetes mellitus with diabetic chronic kidney disease: Secondary | ICD-10-CM | POA: Diagnosis not present

## 2019-02-17 DIAGNOSIS — C9000 Multiple myeloma not having achieved remission: Secondary | ICD-10-CM | POA: Diagnosis not present

## 2019-02-17 DIAGNOSIS — D631 Anemia in chronic kidney disease: Secondary | ICD-10-CM | POA: Diagnosis not present

## 2019-02-17 DIAGNOSIS — D472 Monoclonal gammopathy: Secondary | ICD-10-CM | POA: Diagnosis not present

## 2019-02-17 NOTE — Patient Instructions (Addendum)
[place] at [facility] Discharge Instructions  You were seen today by Dr. Delton Coombes.  He wants to see you back in 3 months for follow up.  He will continue to retacrit injections every 2 weeks.  We will also set you up for one dose of iron.     Thank you for choosing [facility] at [facility] to provide your oncology and hematology care.  To afford each patient quality time with our provider, please arrive at least 15 minutes before your scheduled appointment time.   If you have a lab appointment with the [facility] please come in thru the Main Entrance and check in at the main information desk.  You need to re-schedule your appointment should you arrive 10 or more minutes late.  We strive to give you quality time with our providers, and arriving late affects you and other patients whose appointments are after yours.  Also, if you no show three or more times for appointments you may be dismissed from the clinic at the providers discretion.     Again, thank you for choosing [facility].  Our hope is that these requests will decrease the amount of time that you wait before being seen by our physicians.       _____________________________________________________________  Should you have questions after your visit to [facility], please contact our office at [phone] between the hours of 8:00 a.m. and 4:30 p.m.  Voicemails left after 4:00 p.m. will not be returned until the following business day.  For prescription refill requests, have your pharmacy contact our office and allow 72 hours.    Due to Covid, you will need to wear a mask upon entering the hospital. If you do not have a mask, a mask will be given to you at the Main Entrance upon arrival. For doctor visits, patients may have 1 support person with them. For treatment visits, patients can not have anyone with them due to social distancing guidelines and our  immunocompromised population.

## 2019-02-17 NOTE — Progress Notes (Signed)
[place], [place] [address]   CLINIC:  Medical Oncology/Hematology  PCP:  [name], NP [place] [address] [phone]   REASON FOR VISIT: Follow-up for iron deficiency anemia and MGUS  CURRENT THERAPY:  Intermittent iron therapy and Retacrit.   INTERVAL HISTORY:  Henry Carroll 69 y.o. male seen for follow-up of MGUS, chronic kidney disease, and a normocytic anemia.  Denies any bleeding per rectum or melena.  Appetite is 75%.  Energy levels are low at 25%.  Denies any new onset bone pains.  No fevers or infections were reported.  Leg swellings have been stable.  REVIEW OF SYSTEMS:  Review of Systems  Cardiovascular: Positive for leg swelling.  Psychiatric/Behavioral: Positive for sleep disturbance.  All other systems reviewed and are negative.    PAST MEDICAL/SURGICAL HISTORY:  Past Medical History:  Diagnosis Date  . Abnormal stress test 02/10/2015  . Acute on chronic systolic and diastolic heart failure, NYHA class 1 ([place]) 12/31/2014  . Anemia   . Anemia in chronic kidney disease 09/03/2012  . 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 Asthma   . Asthma, chronic 06/04/2012  . Asthma, chronic, mild intermittent, with acute exacerbation 12/22/2015  . Bilateral lower extremity edema 12/22/2015  . CAD (coronary artery disease) 02/18/2015  . Chronic combined systolic (congestive) and diastolic (congestive) heart failure ([place])    a. 12/31/14: 2D ECHO: EF 40-45%, HK of inf myocardium, G1DD, mod MR  . Chronic combined systolic and diastolic CHF (congestive heart failure) ([place]) 02/10/2015  . Chronic systolic heart failure ([place]) 02/25/2015  . CKD (chronic kidney disease), stage III    stage 3 kidney disease  . CKD (chronic kidney disease), stage V ([place]) 08/30/2018  . COLONIC POLYPS, HX OF 04/13/2009   Qualifier: Diagnosis of  By: Westly Pam.   . Coronary artery disease    a. LHC 01/2015 - triple vessel CAD (mod  oLM, mLAD, severe mRCA, intermediate branch stenosis, CTO of mCx). Plan CABG 02/2015.  Marland Kitchen Demand ischemia ([place]) 12/31/2014  . DM type 2, uncontrolled, with renal complications ([place]) 4/88/8916  . Dyspnea     due to fluid  . Elevated troponin   . Essential hypertension 10/09/2013  . Folliculitis of perineum 10/01/2012  . Gastric erosion   . GERD (gastroesophageal reflux disease)   . History of blood transfusion   . Hyperlipidemia   . Hyperlipidemia with target LDL less than 100 12/31/2014  . Hypertension   . Hypertensive heart disease with heart failure ([place]) 02/25/2015  . IDA (iron deficiency anemia) 01/23/2016  . Melena 01/23/2016  . 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 Mitral regurgitation    a. Mild-mod by echo 12/2014.  . Morbid obesity ([place]) 12/29/2014  . Myocardial infarction [facility])    pt. states per [name] he has in the past  . NSVT (nonsustained ventricular tachycardia) ([place])    a. 9 beats during 01/2015 adm. BB titrated.  . Obesity    a. BMI 33  . Obesity (BMI 30-39.9) 05/05/2013  . OSA (obstructive sleep apnea) 01/16/2018   Mild obstructive sleep apnea overall with an AHI of 7.3/h and no significant central sleep apnea. Severe obstructive sleep apnea during REM sleep with an AHI of 34.3/h.  Now on CPAP at 6 cm H2O.   . Other malaise and fatigue 09/03/2012  . 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 PAD (peripheral artery disease) ([place])  03/02/2015  . Pneumonia   . Sleep apnea    2019, Dr.Turner diagnoised   . Small bowel lesion   . Symptomatic anemia 08/29/2018  . 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 Type II diabetes mellitus ([place])   . Wears dentures    Past Surgical History:  Procedure Laterality Date  . BACK SURGERY    . BASCILIC VEIN TRANSPOSITION Left 11/14/2018   Procedure: FIRST STAGE BASILIC VEIN TRANSPOSITION LEFT ARM;  Surgeon: Serafina Mitchell, MD;  Location: [facility];  Service: Vascular;  Laterality: Left;  . BASCILIC VEIN  TRANSPOSITION Left 01/28/2019   Procedure: BASILIC VEIN TRANSPOSITION SECOND STAGE;  Surgeon: Serafina Mitchell, MD;  Location: [facility];  Service: Vascular;  Laterality: Left;  . BONE MARROW BIOPSY    . CARDIAC CATHETERIZATION N/A 02/09/2015   Procedure: Left Heart Cath and Coronary Angiography;  Surgeon: Burnell Blanks, MD;  Location: [facility];  Service: Cardiovascular;  Laterality: N/A;  . COLONOSCOPY  2011   Dr. [name]: Ileocecal valve appeared normal, scattered pancolonic diverticulosis, difficult bowel prep making smaller lesions potentially missed. Recommended three-year follow-up colonoscopy.  . COLONOSCOPY  2003   Dr. [name]: Suspicious lesion at the ileocecal valve, multiple biopsies benign, pancolonic diverticulosis.  . COLONOSCOPY N/A 02/15/2016   diverticulosis in sigmoid and descending colon, single 5 mm polyp at splenic flexure (Tubular adenoma)  . CORONARY ARTERY BYPASS GRAFT N/A 02/18/2015   Procedure: CORONARY ARTERY BYPASS GRAFTING (CABG) x  four, using bilateral internal mammary arteries and right leg greater saphenous vein harvested endoscopically;  Surgeon: [name], MD;  Location: [facility];  Service: Open Heart Surgery;  Laterality: N/A;  . ESOPHAGOGASTRODUODENOSCOPY N/A 02/15/2016   normal  . EYE SURGERY Right    July 2019   . GIVENS CAPSULE STUDY N/A 01/08/2017   couple of gastric and small bowel eroions in setting of aspirin 81 mg daily but nothing concerning, continue Hematology follow-up  . HERNIA REPAIR  3500   Umbilical  . LUMBAR [place] SURGERY  2006   L4 & L5  . REFRACTIVE SURGERY Left    2019   . REFRACTIVE SURGERY Right    2019  . TEE WITHOUT CARDIOVERSION N/A 02/18/2015   Procedure: TRANSESOPHAGEAL ECHOCARDIOGRAM (TEE);  Surgeon: [name], MD;  Location: [facility];  Service: Open Heart Surgery;  Laterality: N/A;  . TONSILLECTOMY  1962     SOCIAL HISTORY:  Social History   Socioeconomic History  . Marital status: Married    Spouse name:  Not on file  . Number of children: Not on file  . Years of education: Not on file  . Highest education level: Not on file  Occupational History  . Not on file  Tobacco Use  . Smoking status: Former Smoker    Years: 17.00    Types: Cigars    Quit date: 12/31/2014    Years since quitting: 4.1  . Smokeless tobacco: Never Used  Substance and Sexual Activity  . Alcohol use: Yes    Alcohol/week: 3.0 standard drinks    Types: 3 Glasses of wine per week    Comment: occasional glass of wine.  . Drug use: No  . Sexual activity: Yes    Birth control/protection: None  Other Topics Concern  . Not on file  Social History Narrative  . Not on file   Social Determinants of Health   Financial Resource Strain:   . Difficulty of Paying Living Expenses: Not on file  Food Insecurity:   .  Worried About Charity fundraiser in the Last Year: Not on file  . Ran Out of Food in the Last Year: Not on file  Transportation Needs:   . Lack of Transportation (Medical): Not on file  . Lack of Transportation (Non-Medical): Not on file  Physical Activity:   . Days of Exercise per Week: Not on file  . Minutes of Exercise per Session: Not on file  Stress:   . Feeling of Stress : Not on file  Social Connections:   . Frequency of Communication with Friends and Family: Not on file  . Frequency of Social Gatherings with Friends and Family: Not on file  . Attends Religious Services: Not on file  . Active Member of Clubs or Organizations: Not on file  . Attends Archivist Meetings: Not on file  . Marital Status: Not on file  Intimate Partner Violence:   . Fear of Current or Ex-Partner: Not on file  . Emotionally Abused: Not on file  . Physically Abused: Not on file  . Sexually Abused: Not on file    FAMILY HISTORY:  Family History  Problem Relation Age of Onset  . Diabetes Mother   . Heart disease Mother        later in life, age >8  . Heart disease Father        heart failure later in  life  . Hypertension Father   . Stroke Sister   . Kidney disease Daughter   . Sudden death Daughter   . Colon cancer Paternal Uncle        Passed from colon CA  . Heart attack Neg Hx     CURRENT MEDICATIONS:  Outpatient Encounter Medications as of 02/17/2019  Medication Sig  . amLODipine (NORVASC) 10 MG tablet Take 1 tablet by mouth once daily (Patient taking differently: Take 10 mg by mouth daily. )  . aspirin EC 81 MG tablet Take 1 tablet (81 mg total) by mouth daily.  Marland Kitchen atorvastatin (LIPITOR) 40 MG tablet TAKE 1 TABLET BY MOUTH ONCE DAILY IN THE EVENING (Patient taking differently: Take 40 mg by mouth daily. )  . carvedilol (COREG) 25 MG tablet TAKE 1 TABLET BY MOUTH TWICE DAILY WITH A MEAL  . Cholecalciferol (VITAMIN D3) 50 MCG (2000 UT) TABS Take 4,000 Units by mouth daily.  . Cinnamon 500 MG capsule Take 1,000 mg by mouth daily.   . Coenzyme Q10 (CO Q 10 PO) Take 200 mg by mouth daily.   Marland Kitchen epoetin alfa-epbx (RETACRIT) 78295 UNIT/ML injection 20,000 Units every 14 (fourteen) days.   . furosemide (LASIX) 40 MG tablet Take 2 tablets (80 mg total) by mouth 2 (two) times daily.  Marland Kitchen glucose blood (ONE TOUCH ULTRA TEST) test strip Use to test blood sugar three times daily. Dx: E11.21  . hydrALAZINE (APRESOLINE) 50 MG tablet Take 50 mg by mouth 2 (two) times daily.  . insulin detemir (LEVEMIR) 100 UNIT/ML injection Inject 0.15 mLs (15 Units total) into the skin daily. In the morning  . isosorbide mononitrate (IMDUR) 30 MG 24 hr tablet Take 1 tablet (30 mg total) by mouth daily.  . metolazone (ZAROXOLYN) 5 MG tablet Take 5 mg by mouth 2 (two) times a week. Tuesday and Saturday only   . montelukast (SINGULAIR) 10 MG tablet TAKE 1 TABLET BY MOUTH AT BEDTIME (Patient taking differently: Take 10 mg by mouth at bedtime. )  . Multiple Vitamin (MULTIVITAMIN WITH MINERALS) TABS tablet Take 1 tablet by mouth daily.  Marland Kitchen  acetaminophen (TYLENOL) 500 MG tablet Take 1,000 mg by mouth 2 (two) times daily as  needed for moderate pain.  Marland Kitchen albuterol (PROVENTIL) (2.5 MG/3ML) 0.083% nebulizer solution USE ONE VIAL IN NEBULIZER EVERY 6 HOURS AS NEEDED FOR WHEEZING FOR SHORTNESS OF BREATH (Patient not taking: Reported on 02/17/2019)  . fluticasone (FLONASE) 50 MCG/ACT nasal spray Place 2 sprays into both nostrils daily as needed for allergies or rhinitis (SEASONAL ALLERGIES).  . Fluticasone-Salmeterol (ADVAIR) 500-50 MCG/DOSE AEPB Inhale 1 puff into the lungs 2 (two) times daily as needed (respiratory issues.).  Marland Kitchen nitroGLYCERIN (NITROSTAT) 0.4 MG SL tablet Place 1 tablet (0.4 mg total) under the tongue every 5 (five) minutes as needed for chest pain. (Patient not taking: Reported on 02/17/2019)  . PROAIR HFA 108 (90 Base) MCG/ACT inhaler INHALE 2 PUFFS INTO LUNGS EVERY 6 HOURS AS NEEDED FOR SHORTNESS OF BREATH (Patient not taking: INHALE 2 PUFFS INTO LUNGS EVERY 6 HOURS AS NEEDED FOR SHORTNESS OF BREATH)  . [DISCONTINUED] HYDROcodone-acetaminophen (NORCO) 5-325 MG tablet Take 1 tablet by mouth every 6 (six) hours as needed for moderate pain.   Facility-Administered Encounter Medications as of 02/17/2019  Medication  . regadenoson (LEXISCAN) injection SOLN 0.4 mg    ALLERGIES:  No Known Allergies   PHYSICAL EXAM:  ECOG Performance status: 1  Vitals:   02/17/19 1520 02/17/19 1525  BP: (!) 151/71 (!) 151/71  Pulse: 66 66  Resp: 18 18  Temp: (!) 97.5 F (36.4 C) (!) 97.5 F (36.4 C)  SpO2: 97% 97%   Filed Weights   02/17/19 1520 02/17/19 1525  Weight: 210 lb 9.6 oz (95.5 kg) 210 lb 9.6 oz (95.5 kg)    Physical Exam Constitutional:      Appearance: Normal appearance. He is normal weight.  Cardiovascular:     Rate and Rhythm: Normal rate and regular rhythm.     Heart sounds: Normal heart sounds.  Pulmonary:     Effort: Pulmonary effort is normal.     Breath sounds: Normal breath sounds.  Abdominal:     General: There is no distension.     Palpations: Abdomen is soft. There is no mass.   Musculoskeletal:        General: Normal range of motion.     Right lower leg: Edema present.     Left lower leg: Edema present.  Skin:    General: Skin is warm and dry.  Neurological:     Mental Status: He is alert and oriented to person, place, and time. Mental status is at baseline.  Psychiatric:        Mood and Affect: Mood normal.        Behavior: Behavior normal.      LABORATORY DATA:  I have reviewed the labs as listed.  CBC    Component Value Date/Time   WBC 5.1 02/10/2019 1231   RBC 2.94 (L) 02/10/2019 1231   HGB 9.1 (L) 02/10/2019 1231   HGB 10.0 (L) 04/01/2018 1019   HCT 28.6 (L) 02/10/2019 1231   HCT 28.6 (L) 04/01/2018 1019   PLT 194 02/10/2019 1231   PLT 290 04/01/2018 1019   MCV 97.3 02/10/2019 1231   MCV 96 04/01/2018 1019   MCH 31.0 02/10/2019 1231   MCHC 31.8 02/10/2019 1231   RDW 13.7 02/10/2019 1231   RDW 12.2 04/01/2018 1019   LYMPHSABS 1.0 02/10/2019 1231   LYMPHSABS 1.1 11/15/2016 0815   MONOABS 0.6 02/10/2019 1231   EOSABS 0.2 02/10/2019 1231   EOSABS  0.2 11/15/2016 0815   BASOSABS 0.0 02/10/2019 1231   BASOSABS 0.0 11/15/2016 0815   CMP Latest Ref Rng & Units 02/10/2019 02/09/2019 01/28/2019  Glucose 70 - 99 mg/dL 135(H) 256(H) 123(H)  BUN 8 - 23 mg/dL 110(H) 124(H) 136(H)  Creatinine 0.61 - 1.24 mg/dL 6.57(H) 6.65(H) 6.60(H)  Sodium 135 - 145 mmol/L 135 137 137  Potassium 3.5 - 5.1 mmol/L 4.2 4.1 3.8  Chloride 98 - 111 mmol/L 98 100 100  CO2 22 - 32 mmol/L 22 25 -  Calcium 8.9 - 10.3 mg/dL 9.3 9.2 -  Total Protein 6.5 - 8.1 g/dL 7.0 - -  Total Bilirubin 0.3 - 1.2 mg/dL 0.6 - -  Alkaline Phos 38 - 126 U/L 44 - -  AST 15 - 41 U/L 21 - -  ALT 0 - 44 U/L 13 - -       DIAGNOSTIC IMAGING:  I have independently reviewed the scans and discussed with the patient.    ASSESSMENT & PLAN:   MGUS (monoclonal gammopathy of unknown significance) 1.  IgG lambda plasma cell disorder: -SPEP on 09/16/2018 shows M spike of 0.3 g/dL.  Lambda  light chains are 145 (previously 71 on 02/10/2018), kappa light chains 160, ratio of 1.11. -Bone marrow biopsy done secondary to worsening kidney function showed hypercellular marrow with 10% lambda restricted plasma cells.  Chromosome analysis was 46, XY.  FISH panel was normal. -PET scan on 10/13/2018 did not show any evidence of myeloma. -Kidney biopsy on 11/21/2018 showed severe diabetic nephropathy with nodular glomerulosclerosis, severe arteriolar hyalinosis, focal and segmental glomerular tuft scarring in association with severe arterial sclerosis and diffuse severe interstitial fibrosis and tubular atrophy.  No evidence of monoclonal immunoglobulin deposition disease or an immune complex mediated glomerulonephritis. -He already has a left arm fistula for dialysis in the future. -We reviewed his blood work including myeloma panel from 02/10/2019.  SPEP is stable at 0.4.  Free lambda light chains are 135.5 and stable.  Kappa light chains are 150 and ratio is 1.11. -Since there is no worsening of his myeloma panel, I have recommended follow-up in 3 months with repeat labs.  2.  Leg swelling: -He is continuing furosemide 80 mg twice daily.  3.  Normocytic anemia: -Combination anemia from CKD and relative iron deficiency. -Last Feraheme was on 12/03/2018.  Denies any bleeding per rectum or melena. -Retacrit 20,000 units every 2 weeks started in July 2020.  Last injection was on 02/09/2019. -We reviewed recent iron panel from 02/09/2019.  Ferritin is 277 and percent saturation is 16.  Hemoglobin was 9.1. -I have recommended 1 more infusion of Feraheme.  We plan to repeat iron panel prior to next visit.     Orders placed this encounter:  Orders Placed This Encounter  Procedures  . CBC with Differential  . Comprehensive metabolic panel  . Ferritin  . Iron and TIBC  . Kappa/lambda light chains  . Protein electrophoresis, serum  . CBC with Differential      Henry Jack, MD  [place] [phone]

## 2019-02-17 NOTE — Assessment & Plan Note (Addendum)
1.  IgG lambda plasma cell disorder: -SPEP on 09/16/2018 shows M spike of 0.3 g/dL.  Lambda light chains are 145 (previously 71 on 02/10/2018), kappa light chains 160, ratio of 1.11. -Bone marrow biopsy done secondary to worsening kidney function showed hypercellular marrow with 10% lambda restricted plasma cells.  Chromosome analysis was 46, XY.  FISH panel was normal. -PET scan on 10/13/2018 did not show any evidence of myeloma. -Kidney biopsy on 11/21/2018 reviewed by me showed severe diabetic nephropathy with nodular glomerulosclerosis, severe arteriolar hyalinosis, focal and segmental glomerular tuft scarring in association with severe arterial sclerosis and diffuse severe interstitial fibrosis and tubular atrophy.  No evidence of monoclonal immunoglobulin deposition disease or an immune complex mediated glomerulonephritis. -He already has a left arm fistula for dialysis in the future. -We reviewed his blood work including myeloma panel from 02/10/2019.  SPEP is stable at 0.4.  Free lambda light chains are 135.5 and stable.  Kappa light chains are 150 and ratio is 1.11. -Since there is no worsening of his myeloma panel, I have recommended follow-up in 3 months with repeat labs.  2.  Leg swelling: -He is continuing furosemide 80 mg twice daily.  3.  Normocytic anemia: -Combination anemia from CKD and relative iron deficiency. -Last Feraheme was on 12/03/2018.  Denies any bleeding per rectum or melena. -Retacrit 20,000 units every 2 weeks started in July 2020.  Last injection was on 02/09/2019. -We reviewed recent iron panel from 02/09/2019.  Ferritin is 277 and percent saturation is 16.  Hemoglobin was 9.1. -I have recommended 1 more infusion of Feraheme.  We plan to repeat iron panel prior to next visit.

## 2019-02-18 ENCOUNTER — Encounter: Payer: Self-pay | Admitting: Family

## 2019-02-18 ENCOUNTER — Ambulatory Visit (INDEPENDENT_AMBULATORY_CARE_PROVIDER_SITE_OTHER): Payer: Medicare Other | Admitting: Family

## 2019-02-18 ENCOUNTER — Other Ambulatory Visit: Payer: Self-pay

## 2019-02-18 VITALS — BP 139/72 | HR 63 | Temp 97.0°F | Resp 16 | Ht 74.0 in | Wt 211.0 lb

## 2019-02-18 DIAGNOSIS — N185 Chronic kidney disease, stage 5: Secondary | ICD-10-CM

## 2019-02-18 DIAGNOSIS — T82590A Other mechanical complication of surgically created arteriovenous fistula, initial encounter: Secondary | ICD-10-CM

## 2019-02-18 NOTE — Progress Notes (Signed)
CC: follow up s/p second stage left basilic vein AVF  History of Present Illness  Henry Carroll is a 69 y.o. (12/02/1950) male who is s/p 2nd stage left basilic vein fistula on 68-34-19 by Dr. Trula Slade.  He returns today for post op check.   He has not yet started hemodialysis. Pt states he was declined for a kidney transplant due to chronic heart failure.  He indicates that he does not imminently need to start HD.   He denies steal type symptoms in his left hand.    Past Medical History:  Diagnosis Date  . Abnormal stress test 02/10/2015  . Acute on chronic systolic and diastolic heart failure, NYHA class 1 ([place]) 12/31/2014  . Anemia   . Anemia in chronic kidney disease 09/03/2012  . 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 Asthma   . Asthma, chronic 06/04/2012  . Asthma, chronic, mild intermittent, with acute exacerbation 12/22/2015  . Bilateral lower extremity edema 12/22/2015  . CAD (coronary artery disease) 02/18/2015  . Chronic combined systolic (congestive) and diastolic (congestive) heart failure ([place])    a. 12/31/14: 2D ECHO: EF 40-45%, HK of inf myocardium, G1DD, mod MR  . Chronic combined systolic and diastolic CHF (congestive heart failure) ([place]) 02/10/2015  . Chronic systolic heart failure ([place]) 02/25/2015  . CKD (chronic kidney disease), stage III    stage 3 kidney disease  . CKD (chronic kidney disease), stage V ([place]) 08/30/2018  . COLONIC POLYPS, HX OF 04/13/2009   Qualifier: Diagnosis of  By: Westly Pam.   . Coronary artery disease    a. LHC 01/2015 - triple vessel CAD (mod oLM, mLAD, severe mRCA, intermediate branch stenosis, CTO of mCx). Plan CABG 02/2015.  Marland Kitchen Demand ischemia ([place]) 12/31/2014  . DM type 2, uncontrolled, with renal complications ([place]) 08/03/2977  . Dyspnea     due to fluid  . Elevated troponin   . Essential hypertension 10/09/2013  . Folliculitis of perineum 10/01/2012  . Gastric erosion   . GERD (gastroesophageal reflux disease)   .  History of blood transfusion   . Hyperlipidemia   . Hyperlipidemia with target LDL less than 100 12/31/2014  . Hypertension   . Hypertensive heart disease with heart failure ([place]) 02/25/2015  . IDA (iron deficiency anemia) 01/23/2016  . Melena 01/23/2016  . 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 Mitral regurgitation    a. Mild-mod by echo 12/2014.  . Morbid obesity ([place]) 12/29/2014  . Myocardial infarction [facility])    pt. states per [name] he has in the past  . NSVT (nonsustained ventricular tachycardia) ([place])    a. 9 beats during 01/2015 adm. BB titrated.  . Obesity    a. BMI 33  . Obesity (BMI 30-39.9) 05/05/2013  . OSA (obstructive sleep apnea) 01/16/2018   Mild obstructive sleep apnea overall with an AHI of 7.3/h and no significant central sleep apnea. Severe obstructive sleep apnea during REM sleep with an AHI of 34.3/h.  Now on CPAP at 6 cm H2O.   . Other malaise and fatigue 09/03/2012  . 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 PAD (peripheral artery disease) ([place]) 03/02/2015  . Pneumonia   . Sleep apnea    2019, Dr.Turner diagnoised   . Small bowel lesion   . Symptomatic anemia 08/29/2018  . 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 Type II diabetes mellitus ([place])   . Wears dentures     Social History Social History  Tobacco Use  . Smoking status: Former Smoker    Years: 17.00    Types: Cigars    Quit date: 12/31/2014    Years since quitting: 4.1  . Smokeless tobacco: Never Used  Substance Use Topics  . Alcohol use: Yes    Alcohol/week: 3.0 standard drinks    Types: 3 Glasses of wine per week    Comment: occasional glass of wine.  . Drug use: No    Family History Family History  Problem Relation Age of Onset  . Diabetes Mother   . Heart disease Mother        later in life, age >62  . Heart disease Father        heart failure later in life  . Hypertension Father   . Stroke Sister   . Kidney disease Daughter   . Sudden  death Daughter   . Colon cancer Paternal Uncle        Passed from colon CA  . Heart attack Neg Hx     Surgical History Past Surgical History:  Procedure Laterality Date  . BACK SURGERY    . BASCILIC VEIN TRANSPOSITION Left 11/14/2018   Procedure: FIRST STAGE BASILIC VEIN TRANSPOSITION LEFT ARM;  Surgeon: Serafina Mitchell, MD;  Location: [facility];  Service: Vascular;  Laterality: Left;  . BASCILIC VEIN TRANSPOSITION Left 01/28/2019   Procedure: BASILIC VEIN TRANSPOSITION SECOND STAGE;  Surgeon: Serafina Mitchell, MD;  Location: [facility];  Service: Vascular;  Laterality: Left;  . BONE MARROW BIOPSY    . CARDIAC CATHETERIZATION N/A 02/09/2015   Procedure: Left Heart Cath and Coronary Angiography;  Surgeon: Burnell Blanks, MD;  Location: [facility];  Service: Cardiovascular;  Laterality: N/A;  . COLONOSCOPY  2011   Dr. [name]: Ileocecal valve appeared normal, scattered pancolonic diverticulosis, difficult bowel prep making smaller lesions potentially missed. Recommended three-year follow-up colonoscopy.  . COLONOSCOPY  2003   Dr. [name]: Suspicious lesion at the ileocecal valve, multiple biopsies benign, pancolonic diverticulosis.  . COLONOSCOPY N/A 02/15/2016   diverticulosis in sigmoid and descending colon, single 5 mm polyp at splenic flexure (Tubular adenoma)  . CORONARY ARTERY BYPASS GRAFT N/A 02/18/2015   Procedure: CORONARY ARTERY BYPASS GRAFTING (CABG) x  four, using bilateral internal mammary arteries and right leg greater saphenous vein harvested endoscopically;  Surgeon: [name], MD;  Location: [facility];  Service: Open Heart Surgery;  Laterality: N/A;  . ESOPHAGOGASTRODUODENOSCOPY N/A 02/15/2016   normal  . EYE SURGERY Right    July 2019   . GIVENS CAPSULE STUDY N/A 01/08/2017   couple of gastric and small bowel eroions in setting of aspirin 81 mg daily but nothing concerning, continue Hematology follow-up  . HERNIA REPAIR  1610   Umbilical  . LUMBAR [place] SURGERY  2006    L4 & L5  . REFRACTIVE SURGERY Left    2019   . REFRACTIVE SURGERY Right    2019  . TEE WITHOUT CARDIOVERSION N/A 02/18/2015   Procedure: TRANSESOPHAGEAL ECHOCARDIOGRAM (TEE);  Surgeon: [name], MD;  Location: [facility];  Service: Open Heart Surgery;  Laterality: N/A;  . TONSILLECTOMY  1962    No Known Allergies  Current Outpatient Medications  Medication Sig Dispense Refill  . acetaminophen (TYLENOL) 500 MG tablet Take 1,000 mg by mouth 2 (two) times daily as needed for moderate pain.    Marland Kitchen albuterol (PROVENTIL) (2.5 MG/3ML) 0.083% nebulizer solution USE ONE VIAL IN NEBULIZER EVERY 6 HOURS AS NEEDED FOR  WHEEZING FOR SHORTNESS OF BREATH 300 mL 3  . amLODipine (NORVASC) 10 MG tablet Take 1 tablet by mouth once daily (Patient taking differently: Take 10 mg by mouth daily. ) 90 tablet 1  . aspirin EC 81 MG tablet Take 1 tablet (81 mg total) by mouth daily. 90 tablet 3  . atorvastatin (LIPITOR) 40 MG tablet TAKE 1 TABLET BY MOUTH ONCE DAILY IN THE EVENING (Patient taking differently: Take 40 mg by mouth daily. ) 90 tablet 2  . carvedilol (COREG) 25 MG tablet TAKE 1 TABLET BY MOUTH TWICE DAILY WITH A MEAL 180 tablet 3  . Cholecalciferol (VITAMIN D3) 50 MCG (2000 UT) TABS Take 4,000 Units by mouth daily.    . Cinnamon 500 MG capsule Take 1,000 mg by mouth daily.     . Coenzyme Q10 (CO Q 10 PO) Take 200 mg by mouth daily.     Marland Kitchen epoetin alfa-epbx (RETACRIT) 54492 UNIT/ML injection 20,000 Units every 14 (fourteen) days.     . fluticasone (FLONASE) 50 MCG/ACT nasal spray Place 2 sprays into both nostrils daily as needed for allergies or rhinitis (SEASONAL ALLERGIES).    . Fluticasone-Salmeterol (ADVAIR) 500-50 MCG/DOSE AEPB Inhale 1 puff into the lungs 2 (two) times daily as needed (respiratory issues.).    Marland Kitchen furosemide (LASIX) 40 MG tablet Take 2 tablets (80 mg total) by mouth 2 (two) times daily. 360 tablet 1  . glucose blood (ONE TOUCH ULTRA TEST) test strip Use to test blood sugar three times  daily. Dx: E11.21 900 each 1  . hydrALAZINE (APRESOLINE) 50 MG tablet Take 50 mg by mouth 2 (two) times daily.    . insulin detemir (LEVEMIR) 100 UNIT/ML injection Inject 0.15 mLs (15 Units total) into the skin daily. In the morning 10 mL 3  . isosorbide mononitrate (IMDUR) 30 MG 24 hr tablet Take 1 tablet (30 mg total) by mouth daily. 90 tablet 3  . metolazone (ZAROXOLYN) 5 MG tablet Take 5 mg by mouth 2 (two) times a week. Tuesday and Saturday only     . montelukast (SINGULAIR) 10 MG tablet TAKE 1 TABLET BY MOUTH AT BEDTIME (Patient taking differently: Take 10 mg by mouth at bedtime. ) 90 tablet 1  . Multiple Vitamin (MULTIVITAMIN WITH MINERALS) TABS tablet Take 1 tablet by mouth daily.    . nitroGLYCERIN (NITROSTAT) 0.4 MG SL tablet Place 1 tablet (0.4 mg total) under the tongue every 5 (five) minutes as needed for chest pain. 25 tablet 3  . PROAIR HFA 108 (90 Base) MCG/ACT inhaler INHALE 2 PUFFS INTO LUNGS EVERY 6 HOURS AS NEEDED FOR SHORTNESS OF BREATH 18 g 1   No current facility-administered medications for this visit.   Facility-Administered Medications Ordered in Other Visits  Medication Dose Route Frequency Provider Last Rate Last Admin  . regadenoson (LEXISCAN) injection SOLN 0.4 mg  0.4 mg Intravenous Once Jerline Pain, MD         REVIEW OF SYSTEMS: see HPI for pertinent positives and negatives    PHYSICAL EXAMINATION:  Vitals:   02/18/19 1350  BP: 139/72  Pulse: 63  Resp: 16  Temp: (!) 97 F (36.1 C)  TempSrc: Temporal  SpO2: 98%  Weight: 211 lb (95.7 kg)  Height: '6\' 2"'$  (1.88 m)   Body mass index is 27.09 kg/m.  General: Male in NAD, A&O X 3 HEENT:  No gross abnormalities Pulmonary: Respirations are non-labored Abdomen: Soft and non-tender with normal bowel sounds. Musculoskeletal: There are no major deformities.  Neurologic: No focal weakness or paresthesias are detected, bilateral hand grip strength is 5/5.  Skin: There are no ulcer or rashes  noted. Psychiatric: The patient has normal affect. Cardiovascular: There is a regular rate and rhythm without significant murmur appreciated.  Bilateral radial pulses are 2+ palpable.  Left arm AVF with no bruit, no thrill, is pulsatile.   Non-Invasive Vascular Imaging  None since the 2nd stage procedure   Medical Decision Making  Henry Carroll is a 69 y.o. male who is s/p 2nd stage left basilic vein fistula on 91-36-85 by Dr. Trula Slade.    No bruit or thrill at left upper arm AVF, no steal symptoms. I discussed with Dr. Oneida Alar. Will need duplex of AVF to ascertain if stenosis is present or not. No availability today for this; duplex tomorrow, see PA on Friday.    Clemon Chambers, RN, MSN, FNP-C [facility] Office: [phone]  02/18/2019, 2:33 PM  Clinic MD: Laqueta Due

## 2019-02-19 ENCOUNTER — Encounter (HOSPITAL_COMMUNITY): Payer: Medicare Other

## 2019-02-19 ENCOUNTER — Telehealth (HOSPITAL_COMMUNITY): Payer: Self-pay

## 2019-02-19 NOTE — Telephone Encounter (Signed)

## 2019-02-20 ENCOUNTER — Encounter (HOSPITAL_COMMUNITY): Payer: Self-pay

## 2019-02-20 ENCOUNTER — Ambulatory Visit (INDEPENDENT_AMBULATORY_CARE_PROVIDER_SITE_OTHER): Payer: Self-pay | Admitting: Physician Assistant

## 2019-02-20 ENCOUNTER — Ambulatory Visit (HOSPITAL_COMMUNITY)
Admission: RE | Admit: 2019-02-20 | Discharge: 2019-02-20 | Disposition: A | Discharge status: Home / Self Care | Payer: Medicare Other | Source: Ambulatory Visit | Attending: Family | Admitting: Family

## 2019-02-20 ENCOUNTER — Other Ambulatory Visit: Payer: Self-pay

## 2019-02-20 ENCOUNTER — Inpatient Hospital Stay (HOSPITAL_COMMUNITY): Payer: Medicare Other

## 2019-02-20 VITALS — BP 113/48 | HR 58 | Temp 97.9°F | Resp 18

## 2019-02-20 VITALS — BP 140/66 | HR 62 | Temp 97.6°F | Resp 18 | Ht 74.0 in | Wt 210.0 lb

## 2019-02-20 DIAGNOSIS — E1122 Type 2 diabetes mellitus with diabetic chronic kidney disease: Secondary | ICD-10-CM | POA: Diagnosis not present

## 2019-02-20 DIAGNOSIS — N185 Chronic kidney disease, stage 5: Secondary | ICD-10-CM | POA: Diagnosis not present

## 2019-02-20 DIAGNOSIS — D631 Anemia in chronic kidney disease: Secondary | ICD-10-CM | POA: Diagnosis not present

## 2019-02-20 DIAGNOSIS — D472 Monoclonal gammopathy: Secondary | ICD-10-CM | POA: Diagnosis not present

## 2019-02-20 DIAGNOSIS — D509 Iron deficiency anemia, unspecified: Secondary | ICD-10-CM

## 2019-02-20 MED ORDER — SODIUM CHLORIDE 0.9 % IV SOLN
510.0000 mg | Freq: Once | INTRAVENOUS | Status: AC
  Administered 2019-02-20: 510 mg via INTRAVENOUS
  Filled 2019-02-20: qty 510

## 2019-02-20 MED ORDER — SODIUM CHLORIDE 0.9 % IV SOLN: Freq: Once | INTRAVENOUS | Status: AC

## 2019-02-20 NOTE — Progress Notes (Signed)
POST OPERATIVE OFFICE NOTE    CC:  F/u for surgery  HPI:  This is a 69 y.o. male who is s/p 2nd stage left BVT on 01/28/2019 by Dr. Trula Slade.  He was seen on 02/18/2019 by NP and was found to have no thrill or bruit in the fistula.  The pt did not have any steal sx.  He was scheduled to come back today for duplex of fistula and follow up visit.  Continues to do well without complaints.  He is currently not requiring hemodialysis  No Known Allergies  Current Outpatient Medications  Medication Sig Dispense Refill  . acetaminophen (TYLENOL) 500 MG tablet Take 1,000 mg by mouth 2 (two) times daily as needed for moderate pain.    Marland Kitchen albuterol (PROVENTIL) (2.5 MG/3ML) 0.083% nebulizer solution USE ONE VIAL IN NEBULIZER EVERY 6 HOURS AS NEEDED FOR WHEEZING FOR SHORTNESS OF BREATH 300 mL 3  . amLODipine (NORVASC) 10 MG tablet Take 1 tablet by mouth once daily (Patient taking differently: Take 10 mg by mouth daily. ) 90 tablet 1  . aspirin EC 81 MG tablet Take 1 tablet (81 mg total) by mouth daily. 90 tablet 3  . atorvastatin (LIPITOR) 40 MG tablet TAKE 1 TABLET BY MOUTH ONCE DAILY IN THE EVENING (Patient taking differently: Take 40 mg by mouth daily. ) 90 tablet 2  . carvedilol (COREG) 25 MG tablet TAKE 1 TABLET BY MOUTH TWICE DAILY WITH A MEAL 180 tablet 3  . Cholecalciferol (VITAMIN D3) 50 MCG (2000 UT) TABS Take 4,000 Units by mouth daily.    . Cinnamon 500 MG capsule Take 1,000 mg by mouth daily.     . Coenzyme Q10 (CO Q 10 PO) Take 200 mg by mouth daily.     Marland Kitchen epoetin alfa-epbx (RETACRIT) 94709 UNIT/ML injection 20,000 Units every 14 (fourteen) days.     . fluticasone (FLONASE) 50 MCG/ACT nasal spray Place 2 sprays into both nostrils daily as needed for allergies or rhinitis (SEASONAL ALLERGIES).    . Fluticasone-Salmeterol (ADVAIR) 500-50 MCG/DOSE AEPB Inhale 1 puff into the lungs 2 (two) times daily as needed (respiratory issues.).    Marland Kitchen furosemide (LASIX) 40 MG tablet Take 2 tablets (80 mg  total) by mouth 2 (two) times daily. 360 tablet 1  . glucose blood (ONE TOUCH ULTRA TEST) test strip Use to test blood sugar three times daily. Dx: E11.21 900 each 1  . hydrALAZINE (APRESOLINE) 50 MG tablet Take 50 mg by mouth 2 (two) times daily.    . insulin detemir (LEVEMIR) 100 UNIT/ML injection Inject 0.15 mLs (15 Units total) into the skin daily. In the morning 10 mL 3  . isosorbide mononitrate (IMDUR) 30 MG 24 hr tablet Take 1 tablet (30 mg total) by mouth daily. 90 tablet 3  . metolazone (ZAROXOLYN) 5 MG tablet Take 5 mg by mouth 2 (two) times a week. Tuesday and Saturday only     . montelukast (SINGULAIR) 10 MG tablet TAKE 1 TABLET BY MOUTH AT BEDTIME (Patient taking differently: Take 10 mg by mouth at bedtime. ) 90 tablet 1  . Multiple Vitamin (MULTIVITAMIN WITH MINERALS) TABS tablet Take 1 tablet by mouth daily.    . nitroGLYCERIN (NITROSTAT) 0.4 MG SL tablet Place 1 tablet (0.4 mg total) under the tongue every 5 (five) minutes as needed for chest pain. 25 tablet 3  . PROAIR HFA 108 (90 Base) MCG/ACT inhaler INHALE 2 PUFFS INTO LUNGS EVERY 6 HOURS AS NEEDED FOR SHORTNESS OF BREATH 18 g  1   No current facility-administered medications for this visit.   Facility-Administered Medications Ordered in Other Visits  Medication Dose Route Frequency Provider Last Rate Last Admin  . regadenoson (LEXISCAN) injection SOLN 0.4 mg  0.4 mg Intravenous Once Jerline Pain, MD         ROS:  See HPI  Physical Exam:  Incision: Well-healed Extremities: Good bruit and thrill in fistula.  It is palpable along its course.  He has 5/5 grip strength.  2+ palpable radial and ulnar pulses Neuro: Normal motor function left hand.  Alert and oriented x4   Dialysis duplex 02/20/2019:  DIALYSIS ACCESS  Reason for Exam: No palpable thrill for AVF/AVG.  Access Site: Left Upper Extremity.  Access Type: Basilic vein transposition 2nd stage 01/28/2019, 1st stage              11/14/2018.  Performing  Technologist: Alvia Grove RVT    Examination Guidelines: A complete evaluation includes B-mode imaging, spectral Doppler, color Doppler, and power Doppler as needed of all accessible portions of each vessel. Unilateral testing is considered an integral part of a complete examination. Limited examinations for reoccurring indications may be performed as noted.    Findings: +--------------------+----------+-----------------+--------+ AVF                 PSV (cm/s)Flow Vol (mL/min)Comments +--------------------+----------+-----------------+--------+ Native artery inflow   129           780                +--------------------+----------+-----------------+--------+ AVF Anastomosis        332                              +--------------------+----------+-----------------+--------+    +------------+----------+-------------+----------+-----------------------------+ OUTFLOW VEINPSV (cm/s)Diameter (cm)Depth (cm)          Describe            +------------+----------+-------------+----------+-----------------------------+ Prox UA        123        0.77        0.23          Retained valve         +------------+----------+-------------+----------+-----------------------------+ Mid UA         270        0.64        0.35                                 +------------+----------+-------------+----------+-----------------------------+ Dist UA        797        0.39        0.66      Retained valve , slight                                                      change in Diameter       +------------+----------+-------------+----------+-----------------------------+       Summary: Patent left Basilic vein transposition arteriovenous fistula with slight increase of velocity in the outflow vein in the distal upper arm, slight change in diameter. .  *See table(s) above for measurements and observations.   Diagnosing physician: Servando Snare  MD Electronically signed by Servando Snare MD on 02/20/2019 at 2:01:45 PM  Assessment/Plan:  This is a 69 y.o. male who is s/p:  2nd stage left BVT on 01/28/2019 by Dr. Trula Slade who comes back today for duplex after no thrill or bruit in fistula 2 days ago.  -Today there is good thrill and bruit in the graft.  Duplex examination reveals patent graft.  Good depth and diameter of the vein.  Fistula is ready to be accessed    Leontine Locket, PA-C [facility] [phone]  Clinic MD:  Donzetta Matters

## 2019-02-20 NOTE — Patient Instructions (Signed)
[facility] at [facility] Discharge Instructions  Received Feraheme infusion today. Follow-up as scheduled. Call clinic for any questions or concerns   Thank you for choosing [facility] at [facility] to provide your oncology and hematology care.  To afford each patient quality time with our provider, please arrive at least 15 minutes before your scheduled appointment time.   If you have a lab appointment with the Cancer Center please come in thru the Main Entrance and check in at the main information desk.  You need to re-schedule your appointment should you arrive 10 or more minutes late.  We strive to give you quality time with our providers, and arriving late affects you and other patients whose appointments are after yours.  Also, if you no show three or more times for appointments you may be dismissed from the clinic at the providers discretion.     Again, thank you for choosing [facility].  Our hope is that these requests will decrease the amount of time that you wait before being seen by our physicians.       _____________________________________________________________  Should you have questions after your visit to [facility], please contact our office at [phone] between the hours of 8:00 a.m. and 4:30 p.m.  Voicemails left after 4:00 p.m. will not be returned until the following business day.  For prescription refill requests, have your pharmacy contact our office and allow 72 hours.    Due to Covid, you will need to wear a mask upon entering the hospital. If you do not have a mask, a mask will be given to you at the Main Entrance upon arrival. For doctor visits, patients may have 1 support person with them. For treatment visits, patients can not have anyone with them due to social distancing guidelines and our immunocompromised population.     

## 2019-02-20 NOTE — Progress Notes (Signed)
Henry Carroll tolerated Feraheme infusion well without complaints or incident. Peripheral IV site with positive blood return noted prior to and after infusion. VSS upon discharge. Pt discharged self ambulatory in satisfactory condition

## 2019-02-23 ENCOUNTER — Encounter (HOSPITAL_COMMUNITY)
Admission: RE | Admit: 2019-02-23 | Discharge: 2019-02-23 | Disposition: A | Discharge status: Home / Self Care | Payer: Medicare Other | Source: Ambulatory Visit | Attending: Nephrology | Admitting: Nephrology

## 2019-02-23 ENCOUNTER — Other Ambulatory Visit: Payer: Self-pay

## 2019-02-23 DIAGNOSIS — N184 Chronic kidney disease, stage 4 (severe): Secondary | ICD-10-CM | POA: Insufficient documentation

## 2019-02-23 DIAGNOSIS — D631 Anemia in chronic kidney disease: Secondary | ICD-10-CM | POA: Insufficient documentation

## 2019-02-23 LAB — CBC WITH DIFFERENTIAL/PLATELET
Abs Immature Granulocytes: 0.01 10*3/uL (ref 0.00–0.07)
Basophils Absolute: 0 10*3/uL (ref 0.0–0.1)
Basophils Relative: 1 %
Eosinophils Absolute: 0.1 10*3/uL (ref 0.0–0.5)
Eosinophils Relative: 3 %
HCT: 27.4 % — ABNORMAL LOW (ref 39.0–52.0)
Hemoglobin: 9.1 g/dL — ABNORMAL LOW (ref 13.0–17.0)
Immature Granulocytes: 0 %
Lymphocytes Relative: 18 %
Lymphs Abs: 0.7 10*3/uL (ref 0.7–4.0)
MCH: 32.2 pg (ref 26.0–34.0)
MCHC: 33.2 g/dL (ref 30.0–36.0)
MCV: 96.8 fL (ref 80.0–100.0)
Monocytes Absolute: 0.5 10*3/uL (ref 0.1–1.0)
Monocytes Relative: 13 %
Neutro Abs: 2.5 10*3/uL (ref 1.7–7.7)
Neutrophils Relative %: 65 %
Platelets: 175 10*3/uL (ref 150–400)
RBC: 2.83 MIL/uL — ABNORMAL LOW (ref 4.22–5.81)
RDW: 14.5 % (ref 11.5–15.5)
WBC: 3.8 10*3/uL — ABNORMAL LOW (ref 4.0–10.5)
nRBC: 0 % (ref 0.0–0.2)

## 2019-02-23 LAB — RENAL FUNCTION PANEL
Albumin: 3.5 g/dL (ref 3.5–5.0)
Anion gap: 12 (ref 5–15)
BUN: 117 mg/dL — ABNORMAL HIGH (ref 8–23)
CO2: 23 mmol/L (ref 22–32)
Calcium: 9 mg/dL (ref 8.9–10.3)
Chloride: 102 mmol/L (ref 98–111)
Creatinine, Ser: 6.88 mg/dL — ABNORMAL HIGH (ref 0.61–1.24)
GFR calc Af Amer: 8 mL/min — ABNORMAL LOW (ref >60)
GFR calc non Af Amer: 7 mL/min — ABNORMAL LOW (ref >60)
Glucose, Bld: 141 mg/dL — ABNORMAL HIGH (ref 70–99)
Phosphorus: 5 mg/dL — ABNORMAL HIGH (ref 2.5–4.6)
Potassium: 4.3 mmol/L (ref 3.5–5.1)
Sodium: 137 mmol/L (ref 135–145)

## 2019-02-23 LAB — POCT HEMOGLOBIN-HEMACUE: Hemoglobin: 8.8 g/dL — ABNORMAL LOW (ref 13.0–17.0)

## 2019-02-23 LAB — IRON AND TIBC
Iron: 86 ug/dL (ref 45–182)
Saturation Ratios: 26 % (ref 17.9–39.5)
TIBC: 326 ug/dL (ref 250–450)
UIBC: 240 ug/dL

## 2019-02-23 LAB — FERRITIN: Ferritin: 386 ng/mL — ABNORMAL HIGH (ref 24–336)

## 2019-02-23 MED ORDER — EPOETIN ALFA-EPBX 10000 UNIT/ML IJ SOLN
20000.0000 [IU] | Freq: Once | INTRAMUSCULAR | Status: AC
  Administered 2019-02-23: 20000 [IU] via SUBCUTANEOUS

## 2019-02-23 MED ORDER — EPOETIN ALFA-EPBX 10000 UNIT/ML IJ SOLN
INTRAMUSCULAR | Status: AC
  Filled 2019-02-23: qty 2

## 2019-03-09 ENCOUNTER — Other Ambulatory Visit: Payer: Self-pay

## 2019-03-09 ENCOUNTER — Encounter (HOSPITAL_COMMUNITY)
Admission: RE | Admit: 2019-03-09 | Discharge: 2019-03-09 | Disposition: A | Discharge status: Home / Self Care | Payer: Medicare Other | Source: Ambulatory Visit | Attending: Nephrology | Admitting: Nephrology

## 2019-03-09 DIAGNOSIS — E113512 Type 2 diabetes mellitus with proliferative diabetic retinopathy with macular edema, left eye: Secondary | ICD-10-CM | POA: Diagnosis not present

## 2019-03-09 DIAGNOSIS — D631 Anemia in chronic kidney disease: Secondary | ICD-10-CM | POA: Diagnosis not present

## 2019-03-09 DIAGNOSIS — E113511 Type 2 diabetes mellitus with proliferative diabetic retinopathy with macular edema, right eye: Secondary | ICD-10-CM | POA: Diagnosis not present

## 2019-03-09 DIAGNOSIS — H4311 Vitreous hemorrhage, right eye: Secondary | ICD-10-CM | POA: Diagnosis not present

## 2019-03-09 DIAGNOSIS — H2511 Age-related nuclear cataract, right eye: Secondary | ICD-10-CM | POA: Diagnosis not present

## 2019-03-09 DIAGNOSIS — N184 Chronic kidney disease, stage 4 (severe): Secondary | ICD-10-CM | POA: Diagnosis not present

## 2019-03-09 LAB — POCT HEMOGLOBIN-HEMACUE: Hemoglobin: 9.1 g/dL — ABNORMAL LOW (ref 13.0–17.0)

## 2019-03-09 MED ORDER — EPOETIN ALFA-EPBX 10000 UNIT/ML IJ SOLN
INTRAMUSCULAR | Status: AC
  Filled 2019-03-09: qty 2

## 2019-03-09 MED ORDER — EPOETIN ALFA-EPBX 10000 UNIT/ML IJ SOLN
20000.0000 [IU] | Freq: Once | INTRAMUSCULAR | Status: AC
  Administered 2019-03-09: 20000 [IU] via SUBCUTANEOUS

## 2019-03-11 DIAGNOSIS — I120 Hypertensive chronic kidney disease with stage 5 chronic kidney disease or end stage renal disease: Secondary | ICD-10-CM | POA: Diagnosis not present

## 2019-03-11 DIAGNOSIS — N185 Chronic kidney disease, stage 5: Secondary | ICD-10-CM | POA: Diagnosis not present

## 2019-03-11 DIAGNOSIS — D649 Anemia, unspecified: Secondary | ICD-10-CM | POA: Diagnosis not present

## 2019-03-11 DIAGNOSIS — N2581 Secondary hyperparathyroidism of renal origin: Secondary | ICD-10-CM | POA: Diagnosis not present

## 2019-03-19 DIAGNOSIS — E113512 Type 2 diabetes mellitus with proliferative diabetic retinopathy with macular edema, left eye: Secondary | ICD-10-CM | POA: Diagnosis not present

## 2019-03-22 ENCOUNTER — Other Ambulatory Visit: Payer: Self-pay

## 2019-03-22 ENCOUNTER — Ambulatory Visit: Payer: Medicare Other | Attending: Internal Medicine

## 2019-03-22 DIAGNOSIS — Z23 Encounter for immunization: Secondary | ICD-10-CM | POA: Insufficient documentation

## 2019-03-22 NOTE — Progress Notes (Signed)
   Covid-19 Vaccination Clinic  Name:  SANFORD LINDBLAD    MRN: [id number] DOB: [birth year]  03/22/2019  Mr. Cassada was observed post Covid-19 immunization for 15 minutes without incidence. He was provided with Vaccine Information Sheet and instruction to access the V-Safe system.   Mr. Porzio was instructed to call 911 with any severe reactions post vaccine: Marland Kitchen Difficulty breathing  . Swelling of your face and throat  . A fast heartbeat  . A bad rash all over your body  . Dizziness and weakness    Immunizations Administered    Name Date Dose VIS Date Route   Moderna COVID-19 Vaccine 03/22/2019  4:30 PM 0.5 mL 01/13/2019 Intramuscular   Manufacturer: Moderna   Lot: [id number]   [place]: [id number]

## 2019-03-23 ENCOUNTER — Encounter (HOSPITAL_COMMUNITY)
Admission: RE | Admit: 2019-03-23 | Discharge: 2019-03-23 | Disposition: A | Discharge status: Home / Self Care | Payer: Medicare Other | Source: Ambulatory Visit | Attending: Nephrology | Admitting: Nephrology

## 2019-03-23 ENCOUNTER — Other Ambulatory Visit: Payer: Self-pay

## 2019-03-23 ENCOUNTER — Encounter (HOSPITAL_COMMUNITY): Payer: Self-pay

## 2019-03-23 DIAGNOSIS — N184 Chronic kidney disease, stage 4 (severe): Secondary | ICD-10-CM | POA: Insufficient documentation

## 2019-03-23 DIAGNOSIS — D631 Anemia in chronic kidney disease: Secondary | ICD-10-CM | POA: Insufficient documentation

## 2019-03-23 DIAGNOSIS — E113511 Type 2 diabetes mellitus with proliferative diabetic retinopathy with macular edema, right eye: Secondary | ICD-10-CM | POA: Diagnosis not present

## 2019-03-23 LAB — CBC WITH DIFFERENTIAL/PLATELET
Abs Immature Granulocytes: 0.01 10*3/uL (ref 0.00–0.07)
Basophils Absolute: 0 10*3/uL (ref 0.0–0.1)
Basophils Relative: 0 %
Eosinophils Absolute: 0.1 10*3/uL (ref 0.0–0.5)
Eosinophils Relative: 2 %
HCT: 27.9 % — ABNORMAL LOW (ref 39.0–52.0)
Hemoglobin: 8.9 g/dL — ABNORMAL LOW (ref 13.0–17.0)
Immature Granulocytes: 0 %
Lymphocytes Relative: 12 %
Lymphs Abs: 0.7 10*3/uL (ref 0.7–4.0)
MCH: 30.8 pg (ref 26.0–34.0)
MCHC: 31.9 g/dL (ref 30.0–36.0)
MCV: 96.5 fL (ref 80.0–100.0)
Monocytes Absolute: 0.7 10*3/uL (ref 0.1–1.0)
Monocytes Relative: 12 %
Neutro Abs: 4.4 10*3/uL (ref 1.7–7.7)
Neutrophils Relative %: 74 %
Platelets: 181 10*3/uL (ref 150–400)
RBC: 2.89 MIL/uL — ABNORMAL LOW (ref 4.22–5.81)
RDW: 15.1 % (ref 11.5–15.5)
WBC: 5.9 10*3/uL (ref 4.0–10.5)
nRBC: 0 % (ref 0.0–0.2)

## 2019-03-23 LAB — RENAL FUNCTION PANEL
Albumin: 3.5 g/dL (ref 3.5–5.0)
Anion gap: 14 (ref 5–15)
BUN: 148 mg/dL — ABNORMAL HIGH (ref 8–23)
CO2: 22 mmol/L (ref 22–32)
Calcium: 8.9 mg/dL (ref 8.9–10.3)
Chloride: 102 mmol/L (ref 98–111)
Creatinine, Ser: 6.75 mg/dL — ABNORMAL HIGH (ref 0.61–1.24)
GFR calc Af Amer: 9 mL/min — ABNORMAL LOW (ref >60)
GFR calc non Af Amer: 7 mL/min — ABNORMAL LOW (ref >60)
Glucose, Bld: 169 mg/dL — ABNORMAL HIGH (ref 70–99)
Phosphorus: 5.3 mg/dL — ABNORMAL HIGH (ref 2.5–4.6)
Potassium: 4 mmol/L (ref 3.5–5.1)
Sodium: 138 mmol/L (ref 135–145)

## 2019-03-23 LAB — IRON AND TIBC
Iron: 24 ug/dL — ABNORMAL LOW (ref 45–182)
Saturation Ratios: 8 % — ABNORMAL LOW (ref 17.9–39.5)
TIBC: 298 ug/dL (ref 250–450)
UIBC: 274 ug/dL

## 2019-03-23 LAB — FERRITIN: Ferritin: 276 ng/mL (ref 24–336)

## 2019-03-23 LAB — POCT HEMOGLOBIN-HEMACUE: Hemoglobin: 8.7 g/dL — ABNORMAL LOW (ref 13.0–17.0)

## 2019-03-23 MED ORDER — EPOETIN ALFA-EPBX 10000 UNIT/ML IJ SOLN
20000.0000 [IU] | Freq: Once | INTRAMUSCULAR | Status: AC
  Administered 2019-03-23: 20000 [IU] via SUBCUTANEOUS
  Filled 2019-03-23: qty 2

## 2019-04-06 ENCOUNTER — Encounter (HOSPITAL_COMMUNITY)
Admission: RE | Admit: 2019-04-06 | Discharge: 2019-04-06 | Disposition: A | Discharge status: Home / Self Care | Payer: Medicare Other | Source: Ambulatory Visit | Attending: Nephrology | Admitting: Nephrology

## 2019-04-06 ENCOUNTER — Encounter (HOSPITAL_COMMUNITY): Payer: Self-pay

## 2019-04-06 ENCOUNTER — Other Ambulatory Visit: Payer: Self-pay

## 2019-04-06 DIAGNOSIS — N184 Chronic kidney disease, stage 4 (severe): Secondary | ICD-10-CM | POA: Diagnosis not present

## 2019-04-06 DIAGNOSIS — D631 Anemia in chronic kidney disease: Secondary | ICD-10-CM | POA: Diagnosis not present

## 2019-04-06 LAB — POCT HEMOGLOBIN-HEMACUE: Hemoglobin: 8.9 g/dL — ABNORMAL LOW (ref 13.0–17.0)

## 2019-04-06 MED ORDER — EPOETIN ALFA-EPBX 10000 UNIT/ML IJ SOLN: 10000.0000 [IU] | Freq: Once | INTRAMUSCULAR | Status: DC

## 2019-04-06 MED ORDER — EPOETIN ALFA-EPBX 10000 UNIT/ML IJ SOLN
INTRAMUSCULAR | Status: AC
  Filled 2019-04-06: qty 2

## 2019-04-06 MED ORDER — EPOETIN ALFA-EPBX 10000 UNIT/ML IJ SOLN
20000.0000 [IU] | Freq: Once | INTRAMUSCULAR | Status: AC
  Administered 2019-04-06: 20000 [IU] via SUBCUTANEOUS

## 2019-04-20 ENCOUNTER — Other Ambulatory Visit: Payer: Self-pay

## 2019-04-20 ENCOUNTER — Encounter (HOSPITAL_COMMUNITY)
Admission: RE | Admit: 2019-04-20 | Discharge: 2019-04-20 | Disposition: A | Discharge status: Home / Self Care | Payer: Medicare Other | Source: Ambulatory Visit | Attending: Nephrology | Admitting: Nephrology

## 2019-04-20 DIAGNOSIS — N184 Chronic kidney disease, stage 4 (severe): Secondary | ICD-10-CM | POA: Insufficient documentation

## 2019-04-20 DIAGNOSIS — D631 Anemia in chronic kidney disease: Secondary | ICD-10-CM | POA: Diagnosis not present

## 2019-04-20 LAB — RENAL FUNCTION PANEL
Albumin: 3.5 g/dL (ref 3.5–5.0)
Anion gap: 12 (ref 5–15)
BUN: 133 mg/dL — ABNORMAL HIGH (ref 8–23)
CO2: 22 mmol/L (ref 22–32)
Calcium: 8.9 mg/dL (ref 8.9–10.3)
Chloride: 103 mmol/L (ref 98–111)
Creatinine, Ser: 6.73 mg/dL — ABNORMAL HIGH (ref 0.61–1.24)
GFR calc Af Amer: 9 mL/min — ABNORMAL LOW (ref >60)
GFR calc non Af Amer: 8 mL/min — ABNORMAL LOW (ref >60)
Glucose, Bld: 236 mg/dL — ABNORMAL HIGH (ref 70–99)
Phosphorus: 5.3 mg/dL — ABNORMAL HIGH (ref 2.5–4.6)
Potassium: 4 mmol/L (ref 3.5–5.1)
Sodium: 137 mmol/L (ref 135–145)

## 2019-04-20 LAB — CBC WITH DIFFERENTIAL/PLATELET
Abs Immature Granulocytes: 0.01 10*3/uL (ref 0.00–0.07)
Basophils Absolute: 0 10*3/uL (ref 0.0–0.1)
Basophils Relative: 1 %
Eosinophils Absolute: 0.2 10*3/uL (ref 0.0–0.5)
Eosinophils Relative: 4 %
HCT: 27.4 % — ABNORMAL LOW (ref 39.0–52.0)
Hemoglobin: 8.8 g/dL — ABNORMAL LOW (ref 13.0–17.0)
Immature Granulocytes: 0 %
Lymphocytes Relative: 15 %
Lymphs Abs: 0.7 10*3/uL (ref 0.7–4.0)
MCH: 31.1 pg (ref 26.0–34.0)
MCHC: 32.1 g/dL (ref 30.0–36.0)
MCV: 96.8 fL (ref 80.0–100.0)
Monocytes Absolute: 0.6 10*3/uL (ref 0.1–1.0)
Monocytes Relative: 13 %
Neutro Abs: 3 10*3/uL (ref 1.7–7.7)
Neutrophils Relative %: 67 %
Platelets: 159 10*3/uL (ref 150–400)
RBC: 2.83 MIL/uL — ABNORMAL LOW (ref 4.22–5.81)
RDW: 15.1 % (ref 11.5–15.5)
WBC: 4.4 10*3/uL (ref 4.0–10.5)
nRBC: 0 % (ref 0.0–0.2)

## 2019-04-20 LAB — IRON AND TIBC
Iron: 36 ug/dL — ABNORMAL LOW (ref 45–182)
Saturation Ratios: 12 % — ABNORMAL LOW (ref 17.9–39.5)
TIBC: 304 ug/dL (ref 250–450)
UIBC: 268 ug/dL

## 2019-04-20 LAB — POCT HEMOGLOBIN-HEMACUE: Hemoglobin: 8.7 g/dL — ABNORMAL LOW (ref 13.0–17.0)

## 2019-04-20 LAB — FERRITIN: Ferritin: 174 ng/mL (ref 24–336)

## 2019-04-20 MED ORDER — EPOETIN ALFA-EPBX 10000 UNIT/ML IJ SOLN
INTRAMUSCULAR | Status: AC
  Filled 2019-04-20: qty 2

## 2019-04-20 MED ORDER — EPOETIN ALFA-EPBX 10000 UNIT/ML IJ SOLN
10000.0000 [IU] | Freq: Once | INTRAMUSCULAR | Status: AC
  Administered 2019-04-20: 10000 [IU] via SUBCUTANEOUS

## 2019-04-21 DIAGNOSIS — E113512 Type 2 diabetes mellitus with proliferative diabetic retinopathy with macular edema, left eye: Secondary | ICD-10-CM | POA: Diagnosis not present

## 2019-04-21 DIAGNOSIS — H3562 Retinal hemorrhage, left eye: Secondary | ICD-10-CM | POA: Diagnosis not present

## 2019-04-21 DIAGNOSIS — H2512 Age-related nuclear cataract, left eye: Secondary | ICD-10-CM | POA: Diagnosis not present

## 2019-04-22 ENCOUNTER — Other Ambulatory Visit: Payer: Self-pay | Admitting: Nurse Practitioner

## 2019-04-22 ENCOUNTER — Ambulatory Visit: Payer: Medicare Other | Attending: Internal Medicine

## 2019-04-22 DIAGNOSIS — Z23 Encounter for immunization: Secondary | ICD-10-CM | POA: Insufficient documentation

## 2019-04-22 NOTE — Progress Notes (Signed)
   Covid-19 Vaccination Clinic  Name:  Henry Carroll    MRN: [id number] DOB: 1950/06/10  04/22/2019  [name] was observed post Covid-19 immunization for 15 minutes without incident. He was provided with Vaccine Information Sheet and instruction to access the V-Safe system.   [name] was instructed to call 911 with any severe reactions post vaccine: Marland Kitchen Difficulty breathing  . Swelling of face and throat  . A fast heartbeat  . A bad rash all over body  . Dizziness and weakness   Immunizations Administered    Name Date Dose VIS Date Route   Moderna COVID-19 Vaccine 04/22/2019  1:29 PM 0.5 mL 01/13/2019 Intramuscular   Manufacturer: Moderna   Lot: [id number]   [place]: [id number]

## 2019-04-23 DIAGNOSIS — E113512 Type 2 diabetes mellitus with proliferative diabetic retinopathy with macular edema, left eye: Secondary | ICD-10-CM | POA: Diagnosis not present

## 2019-04-23 DIAGNOSIS — E113511 Type 2 diabetes mellitus with proliferative diabetic retinopathy with macular edema, right eye: Secondary | ICD-10-CM | POA: Diagnosis not present

## 2019-04-23 DIAGNOSIS — H2512 Age-related nuclear cataract, left eye: Secondary | ICD-10-CM | POA: Diagnosis not present

## 2019-04-23 DIAGNOSIS — H2511 Age-related nuclear cataract, right eye: Secondary | ICD-10-CM | POA: Diagnosis not present

## 2019-05-04 ENCOUNTER — Encounter (HOSPITAL_COMMUNITY)
Admission: RE | Admit: 2019-05-04 | Discharge: 2019-05-04 | Disposition: A | Discharge status: Home / Self Care | Payer: Medicare Other | Source: Ambulatory Visit | Attending: Nephrology | Admitting: Nephrology

## 2019-05-04 ENCOUNTER — Encounter (HOSPITAL_COMMUNITY): Payer: Self-pay

## 2019-05-04 ENCOUNTER — Other Ambulatory Visit: Payer: Self-pay

## 2019-05-04 DIAGNOSIS — D631 Anemia in chronic kidney disease: Secondary | ICD-10-CM | POA: Diagnosis not present

## 2019-05-04 DIAGNOSIS — N184 Chronic kidney disease, stage 4 (severe): Secondary | ICD-10-CM | POA: Diagnosis not present

## 2019-05-04 LAB — POCT HEMOGLOBIN-HEMACUE: Hemoglobin: 8.9 g/dL — ABNORMAL LOW (ref 13.0–17.0)

## 2019-05-04 MED ORDER — EPOETIN ALFA-EPBX 10000 UNIT/ML IJ SOLN
INTRAMUSCULAR | Status: AC
  Filled 2019-05-04: qty 2

## 2019-05-04 MED ORDER — EPOETIN ALFA-EPBX 10000 UNIT/ML IJ SOLN
20000.0000 [IU] | Freq: Once | INTRAMUSCULAR | Status: AC
  Administered 2019-05-04: 20000 [IU] via SUBCUTANEOUS

## 2019-05-07 ENCOUNTER — Ambulatory Visit: Payer: Medicare Other | Admitting: Nurse Practitioner

## 2019-05-08 ENCOUNTER — Ambulatory Visit: Payer: Medicare Other | Admitting: Nurse Practitioner

## 2019-05-18 ENCOUNTER — Encounter (HOSPITAL_COMMUNITY)
Admission: RE | Admit: 2019-05-18 | Discharge: 2019-05-18 | Disposition: A | Discharge status: Home / Self Care | Payer: Medicare Other | Source: Ambulatory Visit | Attending: Nephrology | Admitting: Nephrology

## 2019-05-18 ENCOUNTER — Encounter (HOSPITAL_COMMUNITY): Payer: Self-pay

## 2019-05-18 ENCOUNTER — Other Ambulatory Visit: Payer: Self-pay | Admitting: Cardiology

## 2019-05-18 ENCOUNTER — Other Ambulatory Visit (HOSPITAL_COMMUNITY): Payer: Medicare Other

## 2019-05-18 ENCOUNTER — Other Ambulatory Visit: Payer: Self-pay

## 2019-05-18 DIAGNOSIS — C9000 Multiple myeloma not having achieved remission: Secondary | ICD-10-CM

## 2019-05-18 DIAGNOSIS — N184 Chronic kidney disease, stage 4 (severe): Secondary | ICD-10-CM | POA: Insufficient documentation

## 2019-05-18 DIAGNOSIS — D631 Anemia in chronic kidney disease: Secondary | ICD-10-CM | POA: Insufficient documentation

## 2019-05-18 LAB — CBC WITH DIFFERENTIAL/PLATELET
Abs Immature Granulocytes: 0.02 10*3/uL (ref 0.00–0.07)
Basophils Absolute: 0 10*3/uL (ref 0.0–0.1)
Basophils Relative: 1 %
Eosinophils Absolute: 0.2 10*3/uL (ref 0.0–0.5)
Eosinophils Relative: 5 %
HCT: 31.7 % — ABNORMAL LOW (ref 39.0–52.0)
Hemoglobin: 10 g/dL — ABNORMAL LOW (ref 13.0–17.0)
Immature Granulocytes: 1 %
Lymphocytes Relative: 16 %
Lymphs Abs: 0.7 10*3/uL (ref 0.7–4.0)
MCH: 30.6 pg (ref 26.0–34.0)
MCHC: 31.5 g/dL (ref 30.0–36.0)
MCV: 96.9 fL (ref 80.0–100.0)
Monocytes Absolute: 0.7 10*3/uL (ref 0.1–1.0)
Monocytes Relative: 15 %
Neutro Abs: 2.8 10*3/uL (ref 1.7–7.7)
Neutrophils Relative %: 62 %
Platelets: 208 10*3/uL (ref 150–400)
RBC: 3.27 MIL/uL — ABNORMAL LOW (ref 4.22–5.81)
RDW: 14.7 % (ref 11.5–15.5)
WBC: 4.4 10*3/uL (ref 4.0–10.5)
nRBC: 0 % (ref 0.0–0.2)

## 2019-05-18 LAB — RENAL FUNCTION PANEL
Albumin: 3.6 g/dL (ref 3.5–5.0)
Anion gap: 15 (ref 5–15)
BUN: 136 mg/dL — ABNORMAL HIGH (ref 8–23)
CO2: 22 mmol/L (ref 22–32)
Calcium: 8.8 mg/dL — ABNORMAL LOW (ref 8.9–10.3)
Chloride: 102 mmol/L (ref 98–111)
Creatinine, Ser: 6.98 mg/dL — ABNORMAL HIGH (ref 0.61–1.24)
GFR calc Af Amer: 8 mL/min — ABNORMAL LOW (ref >60)
GFR calc non Af Amer: 7 mL/min — ABNORMAL LOW (ref >60)
Glucose, Bld: 204 mg/dL — ABNORMAL HIGH (ref 70–99)
Phosphorus: 5.8 mg/dL — ABNORMAL HIGH (ref 2.5–4.6)
Potassium: 3.8 mmol/L (ref 3.5–5.1)
Sodium: 139 mmol/L (ref 135–145)

## 2019-05-18 LAB — IRON AND TIBC
Iron: 32 ug/dL — ABNORMAL LOW (ref 45–182)
Saturation Ratios: 10 % — ABNORMAL LOW (ref 17.9–39.5)
TIBC: 316 ug/dL (ref 250–450)
UIBC: 284 ug/dL

## 2019-05-18 LAB — POCT HEMOGLOBIN-HEMACUE: Hemoglobin: 10 g/dL — ABNORMAL LOW (ref 13.0–17.0)

## 2019-05-18 LAB — FERRITIN: Ferritin: 171 ng/mL (ref 24–336)

## 2019-05-18 MED ORDER — EPOETIN ALFA-EPBX 10000 UNIT/ML IJ SOLN
INTRAMUSCULAR | Status: AC
  Filled 2019-05-18: qty 2

## 2019-05-18 MED ORDER — EPOETIN ALFA-EPBX 10000 UNIT/ML IJ SOLN
20000.0000 [IU] | Freq: Once | INTRAMUSCULAR | Status: AC
  Administered 2019-05-18: 20000 [IU] via SUBCUTANEOUS

## 2019-05-19 LAB — KAPPA/LAMBDA LIGHT CHAINS
Kappa free light chain: 158.3 mg/L — ABNORMAL HIGH (ref 3.3–19.4)
Kappa, lambda light chain ratio: 1.18 (ref 0.26–1.65)
Lambda free light chains: 134.7 mg/L — ABNORMAL HIGH (ref 5.7–26.3)

## 2019-05-20 ENCOUNTER — Ambulatory Visit (INDEPENDENT_AMBULATORY_CARE_PROVIDER_SITE_OTHER): Payer: Medicare Other | Admitting: Nurse Practitioner

## 2019-05-20 ENCOUNTER — Other Ambulatory Visit: Payer: Self-pay

## 2019-05-20 ENCOUNTER — Encounter: Payer: Self-pay | Admitting: Nurse Practitioner

## 2019-05-20 VITALS — BP 140/80 | HR 77 | Temp 97.7°F | Resp 16 | Ht 74.0 in | Wt 208.0 lb

## 2019-05-20 DIAGNOSIS — E1129 Type 2 diabetes mellitus with other diabetic kidney complication: Secondary | ICD-10-CM | POA: Diagnosis not present

## 2019-05-20 DIAGNOSIS — I2510 Atherosclerotic heart disease of native coronary artery without angina pectoris: Secondary | ICD-10-CM

## 2019-05-20 DIAGNOSIS — I739 Peripheral vascular disease, unspecified: Secondary | ICD-10-CM | POA: Diagnosis not present

## 2019-05-20 DIAGNOSIS — E1165 Type 2 diabetes mellitus with hyperglycemia: Secondary | ICD-10-CM

## 2019-05-20 DIAGNOSIS — I2583 Coronary atherosclerosis due to lipid rich plaque: Secondary | ICD-10-CM | POA: Diagnosis not present

## 2019-05-20 DIAGNOSIS — IMO0002 Reserved for concepts with insufficient information to code with codable children: Secondary | ICD-10-CM

## 2019-05-20 DIAGNOSIS — J4541 Moderate persistent asthma with (acute) exacerbation: Secondary | ICD-10-CM

## 2019-05-20 DIAGNOSIS — N185 Chronic kidney disease, stage 5: Secondary | ICD-10-CM

## 2019-05-20 DIAGNOSIS — E663 Overweight: Secondary | ICD-10-CM

## 2019-05-20 DIAGNOSIS — D649 Anemia, unspecified: Secondary | ICD-10-CM

## 2019-05-20 DIAGNOSIS — I5042 Chronic combined systolic (congestive) and diastolic (congestive) heart failure: Secondary | ICD-10-CM

## 2019-05-20 DIAGNOSIS — R600 Localized edema: Secondary | ICD-10-CM | POA: Diagnosis not present

## 2019-05-20 LAB — PROTEIN ELECTROPHORESIS, SERUM
A/G Ratio: 1.3 (ref 0.7–1.7)
Albumin ELP: 3.3 g/dL (ref 2.9–4.4)
Alpha-1-Globulin: 0.2 g/dL (ref 0.0–0.4)
Alpha-2-Globulin: 0.7 g/dL (ref 0.4–1.0)
Beta Globulin: 0.9 g/dL (ref 0.7–1.3)
Gamma Globulin: 0.7 g/dL (ref 0.4–1.8)
Globulin, Total: 2.6 g/dL (ref 2.2–3.9)
M-Spike, %: 0.4 g/dL — ABNORMAL HIGH
Total Protein ELP: 5.9 g/dL — ABNORMAL LOW (ref 6.0–8.5)

## 2019-05-20 MED ORDER — PREDNISONE 10 MG (21) PO TBPK: ORAL_TABLET | ORAL | 0 refills | Status: DC

## 2019-05-20 NOTE — Progress Notes (Signed)
Careteam: Patient Care Team: [name], NP as PCP - General (Geriatric Medicine) Jerline Pain, MD as PCP - Cardiology (Cardiology) Daneil Dolin, MD as Consulting Physician (Gastroenterology) Zadie Rhine Clent Demark, MD as Consulting Physician (Ophthalmology) Elmarie Shiley, MD as Consulting Physician (Nephrology)  PLACE OF SERVICE:  [place] Directive information Does Patient Have a Medical Advance Directive?: No  No Known Allergies  Chief Complaint  Patient presents with  . Follow-up    4 Month Follow Up     HPI: Patient is a 70 y.o. male for follow up  Went to a funeral in [place] 3/24 road out in a [place] but when he got back he started having watery itchy eyes with productive cough. Also had body aches that tylenol helped. Felt like CHF but did not call anyone. Later in the week thought it could be pneumonia but now overall better. A lot better. Cough is still there and still having some problems with shortness of breath   Took benadryl for itchy eye and this is gone. Continues to have occasionally runny nose.   Overweight- has lost weight, now down from obesity which he has been working hard to do. Doing exercise and diet.  Reports exercise has been hard with his shortness of breath.   CHF- stable. Able to take trash down hill and back to house. Reports he is building back up. Swelling in LE stable. Continues on lasix and metolazone   Anemia- scheduled for iron infusions per nephrologist.   Asthma- recent flares, stable at this time.   DM- using levemiir 15 units every other day. No hypoglycemia noted. Blood sugars fasting mostly 120s   CKD, stage 5, NOT on dialysis, ready if needed. Dr Posey Pronto monitoring every 60 days- used to be monthly.  Review of Systems:  Review of Systems  Constitutional: Negative for chills, fever and weight loss.  HENT: Negative for tinnitus.   Respiratory: Positive for cough, shortness of breath and wheezing. Negative for  sputum production.   Cardiovascular: Positive for leg swelling (stable). Negative for chest pain and palpitations.  Gastrointestinal: Negative for abdominal pain, constipation, diarrhea and heartburn.  Genitourinary: Negative for dysuria, frequency and urgency.  Musculoskeletal: Negative for back pain, falls, joint pain and myalgias.  Skin: Negative.   Neurological: Negative for dizziness and headaches.  Psychiatric/Behavioral: Negative for depression and memory loss. The patient does not have insomnia.    Past Medical History:  Diagnosis Date  . Abnormal stress test 02/10/2015  . Acute on chronic systolic and diastolic heart failure, NYHA class 1 ([place]) 12/31/2014  . Anemia   . Anemia in chronic kidney disease 09/03/2012  . 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 Asthma   . Asthma, chronic 06/04/2012  . Asthma, chronic, mild intermittent, with acute exacerbation 12/22/2015  . Bilateral lower extremity edema 12/22/2015  . CAD (coronary artery disease) 02/18/2015  . Chronic combined systolic (congestive) and diastolic (congestive) heart failure ([place])    a. 12/31/14: 2D ECHO: EF 40-45%, HK of inf myocardium, G1DD, mod MR  . Chronic combined systolic and diastolic CHF (congestive heart failure) ([place]) 02/10/2015  . Chronic systolic heart failure ([place]) 02/25/2015  . CKD (chronic kidney disease), stage III    stage 3 kidney disease  . CKD (chronic kidney disease), stage V ([place]) 08/30/2018  . COLONIC POLYPS, HX OF 04/13/2009   Qualifier: Diagnosis of  By: Westly Pam.   . Coronary artery disease  a. LHC 01/2015 - triple vessel CAD (mod oLM, mLAD, severe mRCA, intermediate branch stenosis, CTO of mCx). Plan CABG 02/2015.  Marland Kitchen Demand ischemia ([place]) 12/31/2014  . DM type 2, uncontrolled, with renal complications ([place]) 7/62/2633  . Dyspnea     due to fluid  . Elevated troponin   . Essential hypertension 10/09/2013  . Folliculitis of perineum 10/01/2012  . Gastric erosion   . GERD (gastroesophageal  reflux disease)   . History of blood transfusion   . Hyperlipidemia   . Hyperlipidemia with target LDL less than 100 12/31/2014  . Hypertension   . Hypertensive heart disease with heart failure ([place]) 02/25/2015  . IDA (iron deficiency anemia) 01/23/2016  . Melena 01/23/2016  . 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 Mitral regurgitation    a. Mild-mod by echo 12/2014.  . Morbid obesity ([place]) 12/29/2014  . Myocardial infarction [facility])    pt. states per [name] he has in the past  . NSVT (nonsustained ventricular tachycardia) ([place])    a. 9 beats during 01/2015 adm. BB titrated.  . Obesity    a. BMI 33  . Obesity (BMI 30-39.9) 05/05/2013  . OSA (obstructive sleep apnea) 01/16/2018   Mild obstructive sleep apnea overall with an AHI of 7.3/h and no significant central sleep apnea. Severe obstructive sleep apnea during REM sleep with an AHI of 34.3/h.  Now on CPAP at 6 cm H2O.   . Other malaise and fatigue 09/03/2012  . 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 PAD (peripheral artery disease) ([place]) 03/02/2015  . Pneumonia   . Sleep apnea    2019, Dr.Turner diagnoised   . Small bowel lesion   . Symptomatic anemia 08/29/2018  . 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 Type II diabetes mellitus ([place])   . Wears dentures    Past Surgical History:  Procedure Laterality Date  . BACK SURGERY    . BASCILIC VEIN TRANSPOSITION Left 11/14/2018   Procedure: FIRST STAGE BASILIC VEIN TRANSPOSITION LEFT ARM;  Surgeon: Serafina Mitchell, MD;  Location: [facility];  Service: Vascular;  Laterality: Left;  . BASCILIC VEIN TRANSPOSITION Left 01/28/2019   Procedure: BASILIC VEIN TRANSPOSITION SECOND STAGE;  Surgeon: Serafina Mitchell, MD;  Location: [facility];  Service: Vascular;  Laterality: Left;  . BONE MARROW BIOPSY    . CARDIAC CATHETERIZATION N/A 02/09/2015   Procedure: Left Heart Cath and Coronary Angiography;  Surgeon: Burnell Blanks, MD;  Location: [facility];   Service: Cardiovascular;  Laterality: N/A;  . COLONOSCOPY  2011   Dr. [name]: Ileocecal valve appeared normal, scattered pancolonic diverticulosis, difficult bowel prep making smaller lesions potentially missed. Recommended three-year follow-up colonoscopy.  . COLONOSCOPY  2003   Dr. [name]: Suspicious lesion at the ileocecal valve, multiple biopsies benign, pancolonic diverticulosis.  . COLONOSCOPY N/A 02/15/2016   diverticulosis in sigmoid and descending colon, single 5 mm polyp at splenic flexure (Tubular adenoma)  . CORONARY ARTERY BYPASS GRAFT N/A 02/18/2015   Procedure: CORONARY ARTERY BYPASS GRAFTING (CABG) x  four, using bilateral internal mammary arteries and right leg greater saphenous vein harvested endoscopically;  Surgeon: [name], MD;  Location: [facility];  Service: Open Heart Surgery;  Laterality: N/A;  . ESOPHAGOGASTRODUODENOSCOPY N/A 02/15/2016   normal  . EYE SURGERY Right    July 2019   . GIVENS CAPSULE STUDY N/A 01/08/2017   couple of gastric and small bowel eroions in setting of aspirin 81 mg daily but nothing concerning, continue Hematology follow-up  .  HERNIA REPAIR  0350   Umbilical  . LUMBAR [place] SURGERY  2006   L4 & L5  . REFRACTIVE SURGERY Left    2019   . REFRACTIVE SURGERY Right    2019  . TEE WITHOUT CARDIOVERSION N/A 02/18/2015   Procedure: TRANSESOPHAGEAL ECHOCARDIOGRAM (TEE);  Surgeon: [name], MD;  Location: [facility];  Service: Open Heart Surgery;  Laterality: N/A;  . TONSILLECTOMY  1962   Social History:   reports that he quit smoking about 4 years ago. His smoking use included cigars. He quit after 17.00 years of use. He has never used smokeless tobacco. He reports current alcohol use of about 3.0 standard drinks of alcohol per week. He reports that he does not use drugs.  Family History  Problem Relation Age of Onset  . Diabetes Mother   . Heart disease Mother        later in life, age >71  . Heart disease Father        heart failure later in life   . Hypertension Father   . Stroke Sister   . Kidney disease Daughter   . Sudden death Daughter   . Colon cancer Paternal Uncle        Passed from colon CA  . Heart attack Neg Hx     Medications: Patient's Medications  New Prescriptions   No medications on file  Previous Medications   ACETAMINOPHEN (TYLENOL) 500 MG TABLET    Take 1,000 mg by mouth 2 (two) times daily as needed for moderate pain.   ALBUTEROL (PROVENTIL) (2.5 MG/3ML) 0.083% NEBULIZER SOLUTION    USE ONE VIAL IN NEBULIZER EVERY 6 HOURS AS NEEDED FOR WHEEZING FOR SHORTNESS OF BREATH   ALBUTEROL (VENTOLIN HFA) 108 (90 BASE) MCG/ACT INHALER    Inhale 2 puffs into the lungs every 6 (six) hours as needed for wheezing or shortness of breath.   AMLODIPINE (NORVASC) 10 MG TABLET    Take 1 tablet by mouth once daily   ASPIRIN EC 81 MG TABLET    Take 1 tablet (81 mg total) by mouth daily.   ATORVASTATIN (LIPITOR) 40 MG TABLET    TAKE 1 TABLET BY MOUTH ONCE DAILY IN THE EVENING   CARVEDILOL (COREG) 25 MG TABLET    TAKE 1 TABLET BY MOUTH TWICE DAILY WITH A MEAL   CHOLECALCIFEROL (VITAMIN D3) 50 MCG (2000 UT) TABS    Take 4,000 Units by mouth daily.   CINNAMON 500 MG CAPSULE    Take 1,000 mg by mouth daily.    COENZYME Q10 (CO Q 10 PO)    Take 200 mg by mouth daily.    EPOETIN ALFA-EPBX (RETACRIT) 09381 UNIT/ML INJECTION    20,000 Units every 14 (fourteen) days.    FLUTICASONE (FLONASE) 50 MCG/ACT NASAL SPRAY    Place 2 sprays into both nostrils daily as needed for allergies or rhinitis (SEASONAL ALLERGIES).   FLUTICASONE-SALMETEROL (ADVAIR) 500-50 MCG/DOSE AEPB    Inhale 1 puff into the lungs 2 (two) times daily as needed (respiratory issues.).   FUROSEMIDE (LASIX) 40 MG TABLET    Take 2 tablets by mouth twice daily   GLUCOSE BLOOD (ONE TOUCH ULTRA TEST) TEST STRIP    Use to test blood sugar three times daily. Dx: E11.21   HYDRALAZINE (APRESOLINE) 50 MG TABLET    Take 50 mg by mouth 2 (two) times daily.   INSULIN DETEMIR (LEVEMIR) 100  UNIT/ML INJECTION    Inject 0.15 mLs (15 Units total) into  the skin daily. In the morning   ISOSORBIDE MONONITRATE (IMDUR) 30 MG 24 HR TABLET    Take 1 tablet by mouth once daily   METOLAZONE (ZAROXOLYN) 5 MG TABLET    Take 5 mg by mouth 2 (two) times a week. Tuesday and Saturday only    MONTELUKAST (SINGULAIR) 10 MG TABLET    Take 10 mg by mouth at bedtime.   MULTIPLE VITAMIN (MULTIVITAMIN WITH MINERALS) TABS TABLET    Take 1 tablet by mouth daily.   NITROGLYCERIN (NITROSTAT) 0.4 MG SL TABLET    Place 1 tablet (0.4 mg total) under the tongue every 5 (five) minutes as needed for chest pain.   PROAIR HFA 108 (90 BASE) MCG/ACT INHALER    INHALE 2 PUFFS INTO LUNGS EVERY 6 HOURS AS NEEDED FOR SHORTNESS OF BREATH  Modified Medications   No medications on file  Discontinued Medications   MONTELUKAST (SINGULAIR) 10 MG TABLET    TAKE 1 TABLET BY MOUTH AT BEDTIME    Physical Exam:  Vitals:   05/20/19 1525  BP: 140/80  Pulse: 77  Resp: 16  Temp: 97.7 F (36.5 C)  SpO2: 98%  Weight: 208 lb (94.3 kg)  Height: '6\' 2"'$  (1.88 m)   Body mass index is 26.71 kg/m. Wt Readings from Last 3 Encounters:  05/20/19 208 lb (94.3 kg)  05/04/19 210 lb 1.6 oz (95.3 kg)  03/23/19 210 lb 1.6 oz (95.3 kg)    Physical Exam Constitutional:      General: He is not in acute distress.    Appearance: He is well-developed. He is not diaphoretic.  HENT:     Head: Normocephalic and atraumatic.     Mouth/Throat:     Pharynx: No oropharyngeal exudate.  Eyes:     Conjunctiva/sclera: Conjunctivae normal.     Pupils: Pupils are equal, round, and reactive to light.  Cardiovascular:     Rate and Rhythm: Normal rate and regular rhythm.     Heart sounds: Normal heart sounds.  Pulmonary:     Effort: Pulmonary effort is normal.     Breath sounds: Decreased breath sounds and wheezing (throughout) present.  Abdominal:     General: Bowel sounds are normal.     Palpations: Abdomen is soft.  Musculoskeletal:         General: No tenderness.     Cervical back: Normal range of motion and neck supple.     Right lower leg: Edema (1+) present.     Left lower leg: Edema (1+) present.  Skin:    General: Skin is warm and dry.  Neurological:     Mental Status: He is alert and oriented to person, place, and time.  Psychiatric:        Mood and Affect: Mood normal.        Behavior: Behavior normal.     Labs reviewed: Basic Metabolic Panel: Recent Labs    11/19/18 1200 11/19/18 1213 12/17/18 1134 01/15/19 1044 02/09/19 1338 02/10/19 1231 03/23/19 1429 04/20/19 1352 05/18/19 1021  NA  --    < > 139   < > 137   < > 138 137 139  K  --    < > 4.1   < > 4.1   < > 4.0 4.0 3.8  CL  --    < > 102   < > 100   < > 102 103 102  CO2  --    < > 23   < > 25   < >  $'22 22 22  'p$ GLUCOSE  --    < > 150*   < > 256*   < > 169* 236* 204*  BUN  --    < > 137*   < > 124*   < > 148* 133* 136*  CREATININE  --    < > 7.07*   < > 6.65*   < > 6.75* 6.73* 6.98*  CALCIUM  --    < > 8.9   < > 9.2   < > 8.9 8.9 8.8*  MG 2.9*  --  2.0  --  2.0  --   --   --   --   PHOS  --    < > 5.4*   < > 4.4   < > 5.3* 5.3* 5.8*   < > = values in this interval not displayed.   Liver Function Tests: Recent Labs    10/14/18 1430 10/14/18 1430 11/19/18 1119 12/17/18 1134 02/10/19 1231 02/23/19 1444 03/23/19 1429 04/20/19 1352 05/18/19 1021  AST 17  --  18  --  21  --   --   --   --   ALT 15  --  7  --  13  --   --   --   --   ALKPHOS 51  --  46  --  44  --   --   --   --   BILITOT 0.2*  --  0.2*  --  0.6  --   --   --   --   PROT 6.4*  --  6.5  --  7.0  --   --   --   --   ALBUMIN 3.4*   < > 3.5   < > 3.9   < > 3.5 3.5 3.6   < > = values in this interval not displayed.   No results for input(s): LIPASE, AMYLASE in the last 8760 hours. No results for input(s): AMMONIA in the last 8760 hours. CBC: Recent Labs    03/23/19 1429 04/06/19 1326 04/20/19 1352 04/20/19 1400 05/04/19 1347 05/18/19 0959 05/18/19 1021  WBC 5.9  --  4.4   --   --   --  4.4  NEUTROABS 4.4  --  3.0  --   --   --  2.8  HGB 8.9*   < > 8.8*   < > 8.9* 10.0* 10.0*  HCT 27.9*  --  27.4*  --   --   --  31.7*  MCV 96.5  --  96.8  --   --   --  96.9  PLT 181  --  159  --   --   --  208   < > = values in this interval not displayed.   Lipid Panel: Recent Labs    08/28/18 1036 01/07/19 1108  CHOL 151 114  HDL 51 30*  LDLCALC 80 62  TRIG 104 132  CHOLHDL 3.0 3.8   TSH: No results for input(s): TSH in the last 8760 hours. A1C: Lab Results  Component Value Date   HGBA1C 7.3 (H) 01/07/2019     Assessment/Plan 1. Chronic combined systolic and diastolic CHF (congestive heart failure) (HCC) -stable at this time, euvolemic, continues on coreg, lasix, metolazone  2. DM type 2, uncontrolled, with renal complications (HCC) -has reduced levemir from 30 units every other day to 15 units. He is now not having any hypoglycemic episodes and reports fasting blood sugars are staying around the  120 range.  -Encouraged dietary compliance, routine foot care/monitoring and to keep up with diabetic eye exams through ophthalmology  - Hemoglobin A1c  3. PAD (peripheral artery disease) (HCC) Stable at this time. Continues on ASA 81 mg daily, following with vascular   4. Moderate persistent asthma with acute exacerbation -recently with acute exacerbation, overall doing better but still with significantly reduced breath sounds and wheezing throughout -encouraged to use albuterol more routinely - predniSONE (STERAPRED UNI-PAK 21 TAB) 10 MG (21) TBPK tablet; Use as directed  Dispense: 21 tablet; Refill: 0 -strict return precautions given.  5. CKD (chronic kidney disease), stage V ([place]) Close follow up with nephrologist, not currently on HD  6. Symptomatic anemia Followed by nephrologist, receiving procrit injection routinely with iron infusion  7. Bilateral lower extremity edema Stable, at this time.   8. Coronary artery disease due to lipid rich  plaque Stable, without chest pains at this time. Continues on coreg, asa and lipitor. Followed by cardiology  9. Overweight (BMI 25.0-29.9) Has continued to work hard at weight reduction. Continues to work on portion control and healthy diet. Walking 3 times daily for 10 mins.   Next appt: 6 months.  Carlos American. [place], [facility] [phone]

## 2019-05-20 NOTE — Patient Instructions (Signed)
Start OTC Claritin or zyrtec 10 mg daily  To use albuterol three times daily over the next few days then as needed Prednisone taper as directed

## 2019-05-21 LAB — HEMOGLOBIN A1C
Hgb A1c MFr Bld: 7.5 % of total Hgb — ABNORMAL HIGH (ref <5.7)
Mean Plasma Glucose: 169 (calc)
eAG (mmol/L): 9.3 (calc)

## 2019-05-25 ENCOUNTER — Other Ambulatory Visit: Payer: Self-pay

## 2019-05-25 ENCOUNTER — Encounter (HOSPITAL_COMMUNITY)
Admission: RE | Admit: 2019-05-25 | Discharge: 2019-05-25 | Disposition: A | Discharge status: Home / Self Care | Payer: Medicare Other | Source: Ambulatory Visit | Attending: Nephrology | Admitting: Nephrology

## 2019-05-25 ENCOUNTER — Encounter (HOSPITAL_COMMUNITY): Payer: Self-pay | Admitting: Hematology

## 2019-05-25 ENCOUNTER — Encounter (HOSPITAL_COMMUNITY): Payer: Self-pay

## 2019-05-25 ENCOUNTER — Inpatient Hospital Stay (HOSPITAL_COMMUNITY): Payer: Medicare Other | Attending: Hematology | Admitting: Hematology

## 2019-05-25 VITALS — BP 142/82 | HR 76 | Temp 96.8°F | Resp 18 | Wt 214.8 lb

## 2019-05-25 DIAGNOSIS — I130 Hypertensive heart and chronic kidney disease with heart failure and stage 1 through stage 4 chronic kidney disease, or unspecified chronic kidney disease: Secondary | ICD-10-CM | POA: Insufficient documentation

## 2019-05-25 DIAGNOSIS — N183 Chronic kidney disease, stage 3 unspecified: Secondary | ICD-10-CM | POA: Diagnosis not present

## 2019-05-25 DIAGNOSIS — N184 Chronic kidney disease, stage 4 (severe): Secondary | ICD-10-CM | POA: Diagnosis not present

## 2019-05-25 DIAGNOSIS — D631 Anemia in chronic kidney disease: Secondary | ICD-10-CM | POA: Diagnosis not present

## 2019-05-25 DIAGNOSIS — D472 Monoclonal gammopathy: Secondary | ICD-10-CM | POA: Insufficient documentation

## 2019-05-25 DIAGNOSIS — I5042 Chronic combined systolic (congestive) and diastolic (congestive) heart failure: Secondary | ICD-10-CM | POA: Insufficient documentation

## 2019-05-25 MED ORDER — SODIUM CHLORIDE 0.9 % IV SOLN
510.0000 mg | Freq: Once | INTRAVENOUS | Status: AC
  Administered 2019-05-25: 10:00:00 510 mg via INTRAVENOUS
  Filled 2019-05-25: qty 17

## 2019-05-25 MED ORDER — SODIUM CHLORIDE 0.9 % IV SOLN: Freq: Once | INTRAVENOUS | Status: AC

## 2019-05-25 NOTE — Patient Instructions (Signed)
[facility] Discharge Instructions  You were seen today by Dr. Katragadda. He went over your recent lab results. He will see you back in 4 months for labs and follow up.   Thank you for choosing [facility] at [facility] to provide your oncology and hematology care.  To afford each patient quality time with our provider, please arrive at least 15 minutes before your scheduled appointment time.   If you have a lab appointment with the Cancer Center please come in thru the  Main Entrance and check in at the main information desk  You need to re-schedule your appointment should you arrive 10 or more minutes late.  We strive to give you quality time with our providers, and arriving late affects you and other patients whose appointments are after yours.  Also, if you no show three or more times for appointments you may be dismissed from the clinic at the providers discretion.     Again, thank you for choosing [facility].  Our hope is that these requests will decrease the amount of time that you wait before being seen by our physicians.       _____________________________________________________________  Should you have questions after your visit to [facility], please contact our office at [phone] between the hours of 8:00 a.m. and 4:30 p.m.  Voicemails left after 4:00 p.m. will not be returned until the following business day.  For prescription refill requests, have your pharmacy contact our office and allow 72 hours.    Cancer Center Support Programs:   > Cancer Support Group  2nd Tuesday of the month 1pm-2pm, Journey Room    

## 2019-05-25 NOTE — Assessment & Plan Note (Addendum)
1.  IgG lambda MGUS: -BM BX on 09/29/2018 done secondary to worsening kidney function showed hypercellular marrow with 10% lambda restricted plasma cells.  Chromosome analysis and FISH panel were normal. -PET scan on 10/13/2018 did not show any evidence of myeloma. -Kidney biopsy on 11/21/2018 showed severe diabetic nephropathy with nodular glomerulosclerosis, severe arteriolar hyalinosis, focal and segmental glomerular tuft scarring in association with severe arterial sclerosis and diffuse severe interstitial fibrosis and tubular atrophy.  No evidence of monoclonal immunoglobulin deposition disease or an immune complex mediated glomerulonephritis. -I reviewed labs from 05/18/2019.  M spike is stable at 0.4 g.  Calcium is normal at 8.8.  Free lambda light chain is 134.7 and stable.  Free kappa light chain is 158 with ratio of 1.18.  Creatinine is slightly worse at 6.98. -He has a left arm fistula for dialysis in the future. -I plan to see him back in 4 months with repeat myeloma labs.  We will also do metastatic skeletal survey.  2.  Leg swelling: -He will continue Lasix 80 mg twice daily.  3.  Normocytic anemia: -This is from CKD and iron deficiency. -He received Feraheme today.  His labs show a percent saturation of 10 and ferritin of 171 on 05/18/2019. -He is also receiving Retacrit 20,000 units as needed.  Hemoglobin is 10.0.

## 2019-05-25 NOTE — Progress Notes (Signed)
[place], [place] [address]   CLINIC:  Medical Oncology/Hematology  PCP:  [name], NP [place] [id number] [phone]   REASON FOR VISIT: Follow-up for iron deficiency anemia and MGUS  CURRENT THERAPY:  Intermittent Retacrit and Feraheme.   INTERVAL HISTORY:  Mr. Mcadam 69 y.o. male seen for follow-up of MGUS, CKD and anemia.  Denies any bleeding per rectum or melena.  Swelling in the legs has been stable.-Cough and shortness of breath on exertion is also stable.  Reports appetite of 75%.  Energy levels are low.  He did receive Feraheme this morning.  REVIEW OF SYSTEMS:  Review of Systems  Constitutional: Positive for fatigue.  Respiratory: Positive for cough and shortness of breath.   Cardiovascular: Positive for leg swelling.  All other systems reviewed and are negative.    PAST MEDICAL/SURGICAL HISTORY:  Past Medical History:  Diagnosis Date  . Abnormal stress test 02/10/2015  . Acute on chronic systolic and diastolic heart failure, NYHA class 1 ([place]) 12/31/2014  . Anemia   . Anemia in chronic kidney disease 09/03/2012  . 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 Asthma   . Asthma, chronic 06/04/2012  . Asthma, chronic, mild intermittent, with acute exacerbation 12/22/2015  . Bilateral lower extremity edema 12/22/2015  . CAD (coronary artery disease) 02/18/2015  . Chronic combined systolic (congestive) and diastolic (congestive) heart failure ([place])    a. 12/31/14: 2D ECHO: EF 40-45%, HK of inf myocardium, G1DD, mod MR  . Chronic combined systolic and diastolic CHF (congestive heart failure) ([place]) 02/10/2015  . Chronic systolic heart failure ([place]) 02/25/2015  . CKD (chronic kidney disease), stage III    stage 3 kidney disease  . CKD (chronic kidney disease), stage V ([place]) 08/30/2018  . COLONIC POLYPS, HX OF 04/13/2009   Qualifier: Diagnosis of  By: Westly Pam.   . Coronary artery disease    a. LHC  01/2015 - triple vessel CAD (mod oLM, mLAD, severe mRCA, intermediate branch stenosis, CTO of mCx). Plan CABG 02/2015.  Marland Kitchen Demand ischemia ([place]) 12/31/2014  . DM type 2, uncontrolled, with renal complications ([place]) 3/79/0240  . Dyspnea     due to fluid  . Elevated troponin   . Essential hypertension 10/09/2013  . Folliculitis of perineum 10/01/2012  . Gastric erosion   . GERD (gastroesophageal reflux disease)   . History of blood transfusion   . Hyperlipidemia   . Hyperlipidemia with target LDL less than 100 12/31/2014  . Hypertension   . Hypertensive heart disease with heart failure ([place]) 02/25/2015  . IDA (iron deficiency anemia) 01/23/2016  . Melena 01/23/2016  . 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 Mitral regurgitation    a. Mild-mod by echo 12/2014.  . Morbid obesity ([place]) 12/29/2014  . Myocardial infarction [facility])    pt. states per [name] he has in the past  . NSVT (nonsustained ventricular tachycardia) ([place])    a. 9 beats during 01/2015 adm. BB titrated.  . Obesity    a. BMI 33  . Obesity (BMI 30-39.9) 05/05/2013  . OSA (obstructive sleep apnea) 01/16/2018   Mild obstructive sleep apnea overall with an AHI of 7.3/h and no significant central sleep apnea. Severe obstructive sleep apnea during REM sleep with an AHI of 34.3/h.  Now on CPAP at 6 cm H2O.   . Other malaise and fatigue 09/03/2012  . 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  PAD (peripheral artery disease) ([place]) 03/02/2015  . Pneumonia   . Sleep apnea    2019, Dr.Turner diagnoised   . Small bowel lesion   . Symptomatic anemia 08/29/2018  . 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 Type II diabetes mellitus ([place])   . Wears dentures    Past Surgical History:  Procedure Laterality Date  . BACK SURGERY    . BASCILIC VEIN TRANSPOSITION Left 11/14/2018   Procedure: FIRST STAGE BASILIC VEIN TRANSPOSITION LEFT ARM;  Surgeon: Serafina Mitchell, MD;  Location: [facility];  Service: Vascular;   Laterality: Left;  . BASCILIC VEIN TRANSPOSITION Left 01/28/2019   Procedure: BASILIC VEIN TRANSPOSITION SECOND STAGE;  Surgeon: Serafina Mitchell, MD;  Location: [facility];  Service: Vascular;  Laterality: Left;  . BONE MARROW BIOPSY    . CARDIAC CATHETERIZATION N/A 02/09/2015   Procedure: Left Heart Cath and Coronary Angiography;  Surgeon: Burnell Blanks, MD;  Location: [facility];  Service: Cardiovascular;  Laterality: N/A;  . COLONOSCOPY  2011   Dr. [name]: Ileocecal valve appeared normal, scattered pancolonic diverticulosis, difficult bowel prep making smaller lesions potentially missed. Recommended three-year follow-up colonoscopy.  . COLONOSCOPY  2003   Dr. [name]: Suspicious lesion at the ileocecal valve, multiple biopsies benign, pancolonic diverticulosis.  . COLONOSCOPY N/A 02/15/2016   diverticulosis in sigmoid and descending colon, single 5 mm polyp at splenic flexure (Tubular adenoma)  . CORONARY ARTERY BYPASS GRAFT N/A 02/18/2015   Procedure: CORONARY ARTERY BYPASS GRAFTING (CABG) x  four, using bilateral internal mammary arteries and right leg greater saphenous vein harvested endoscopically;  Surgeon: [name], MD;  Location: [facility];  Service: Open Heart Surgery;  Laterality: N/A;  . ESOPHAGOGASTRODUODENOSCOPY N/A 02/15/2016   normal  . EYE SURGERY Right    July 2019   . GIVENS CAPSULE STUDY N/A 01/08/2017   couple of gastric and small bowel eroions in setting of aspirin 81 mg daily but nothing concerning, continue Hematology follow-up  . HERNIA REPAIR  6295   Umbilical  . LUMBAR [place] SURGERY  2006   L4 & L5  . REFRACTIVE SURGERY Left    2019   . REFRACTIVE SURGERY Right    2019  . TEE WITHOUT CARDIOVERSION N/A 02/18/2015   Procedure: TRANSESOPHAGEAL ECHOCARDIOGRAM (TEE);  Surgeon: [name], MD;  Location: [facility];  Service: Open Heart Surgery;  Laterality: N/A;  . TONSILLECTOMY  1962     SOCIAL HISTORY:  Social History   Socioeconomic History  . Marital  status: Married    Spouse name: Not on file  . Number of children: Not on file  . Years of education: Not on file  . Highest education level: Not on file  Occupational History  . Not on file  Tobacco Use  . Smoking status: Former Smoker    Years: 17.00    Types: Cigars    Quit date: 12/31/2014    Years since quitting: 4.4  . Smokeless tobacco: Never Used  Substance and Sexual Activity  . Alcohol use: Yes    Alcohol/week: 3.0 standard drinks    Types: 3 Glasses of wine per week    Comment: occasional glass of wine.  . Drug use: No  . Sexual activity: Yes    Birth control/protection: None  Other Topics Concern  . Not on file  Social History Narrative  . Not on file   Social Determinants of Health   Financial Resource Strain:   . Difficulty of Paying Living Expenses:  Food Insecurity:   . Worried About Charity fundraiser in the Last Year:   . Arboriculturist in the Last Year:   Transportation Needs:   . Film/video editor (Medical):   Marland Kitchen Lack of Transportation (Non-Medical):   Physical Activity:   . Days of Exercise per Week:   . Minutes of Exercise per Session:   Stress:   . Feeling of Stress :   Social Connections:   . Frequency of Communication with Friends and Family:   . Frequency of Social Gatherings with Friends and Family:   . Attends Religious Services:   . Active Member of Clubs or Organizations:   . Attends Archivist Meetings:   Marland Kitchen Marital Status:   Intimate Partner Violence:   . Fear of Current or Ex-Partner:   . Emotionally Abused:   Marland Kitchen Physically Abused:   . Sexually Abused:     FAMILY HISTORY:  Family History  Problem Relation Age of Onset  . Diabetes Mother   . Heart disease Mother        later in life, age >45  . Heart disease Father        heart failure later in life  . Hypertension Father   . Stroke Sister   . Kidney disease Daughter   . Sudden death Daughter   . Colon cancer Paternal Uncle        Passed from colon CA    . Heart attack Neg Hx     CURRENT MEDICATIONS:  Outpatient Encounter Medications as of 05/25/2019  Medication Sig  . amLODipine (NORVASC) 10 MG tablet Take 1 tablet by mouth once daily  . aspirin EC 81 MG tablet Take 1 tablet (81 mg total) by mouth daily.  Marland Kitchen atorvastatin (LIPITOR) 40 MG tablet TAKE 1 TABLET BY MOUTH ONCE DAILY IN THE EVENING  . carvedilol (COREG) 25 MG tablet TAKE 1 TABLET BY MOUTH TWICE DAILY WITH A MEAL  . Cholecalciferol (VITAMIN D3) 50 MCG (2000 UT) TABS Take 4,000 Units by mouth daily.  . Cinnamon 500 MG capsule Take 1,000 mg by mouth daily.   . Coenzyme Q10 (CO Q 10 PO) Take 200 mg by mouth daily.   Marland Kitchen epoetin alfa-epbx (RETACRIT) 69678 UNIT/ML injection 20,000 Units every 14 (fourteen) days.   . furosemide (LASIX) 40 MG tablet Take 2 tablets by mouth twice daily  . glucose blood (ONE TOUCH ULTRA TEST) test strip Use to test blood sugar three times daily. Dx: E11.21  . hydrALAZINE (APRESOLINE) 50 MG tablet Take 50 mg by mouth 2 (two) times daily.  . insulin detemir (LEVEMIR) 100 UNIT/ML injection Inject 0.15 mLs (15 Units total) into the skin daily. In the morning  . isosorbide mononitrate (IMDUR) 30 MG 24 hr tablet Take 1 tablet by mouth once daily  . metolazone (ZAROXOLYN) 5 MG tablet Take 5 mg by mouth 2 (two) times a week. Tuesday and Saturday only   . montelukast (SINGULAIR) 10 MG tablet Take 10 mg by mouth at bedtime.  . predniSONE (STERAPRED UNI-PAK 21 TAB) 10 MG (21) TBPK tablet Use as directed  . acetaminophen (TYLENOL) 500 MG tablet Take 1,000 mg by mouth 2 (two) times daily as needed for moderate pain.  Marland Kitchen albuterol (PROVENTIL) (2.5 MG/3ML) 0.083% nebulizer solution USE ONE VIAL IN NEBULIZER EVERY 6 HOURS AS NEEDED FOR WHEEZING FOR SHORTNESS OF BREATH (Patient not taking: Reported on 05/25/2019)  . albuterol (VENTOLIN HFA) 108 (90 Base) MCG/ACT inhaler Inhale 2 puffs into  the lungs every 6 (six) hours as needed for wheezing or shortness of breath.  .  fluticasone (FLONASE) 50 MCG/ACT nasal spray Place 2 sprays into both nostrils daily as needed for allergies or rhinitis (SEASONAL ALLERGIES).  . Fluticasone-Salmeterol (ADVAIR) 500-50 MCG/DOSE AEPB Inhale 1 puff into the lungs 2 (two) times daily as needed (respiratory issues.).  Marland Kitchen Multiple Vitamin (MULTIVITAMIN WITH MINERALS) TABS tablet Take 1 tablet by mouth daily.  . nitroGLYCERIN (NITROSTAT) 0.4 MG SL tablet Place 1 tablet (0.4 mg total) under the tongue every 5 (five) minutes as needed for chest pain. (Patient not taking: Reported on 05/25/2019)  . PROAIR HFA 108 (90 Base) MCG/ACT inhaler INHALE 2 PUFFS INTO LUNGS EVERY 6 HOURS AS NEEDED FOR SHORTNESS OF BREATH (Patient not taking: Reported on 05/25/2019)   Facility-Administered Encounter Medications as of 05/25/2019  Medication  . regadenoson (LEXISCAN) injection SOLN 0.4 mg    ALLERGIES:  No Known Allergies   PHYSICAL EXAM:  ECOG Performance status: 1  Vitals:   05/25/19 1053  BP: (!) 142/82  Pulse: 76  Resp: 18  Temp: (!) 96.8 F (36 C)  SpO2: 95%   Filed Weights   05/25/19 1053  Weight: 214 lb 12.8 oz (97.4 kg)    Physical Exam Constitutional:      Appearance: Normal appearance. He is normal weight.  Cardiovascular:     Rate and Rhythm: Normal rate and regular rhythm.     Heart sounds: Normal heart sounds.  Pulmonary:     Effort: Pulmonary effort is normal.     Breath sounds: Normal breath sounds.  Abdominal:     General: There is no distension.     Palpations: Abdomen is soft. There is no mass.  Musculoskeletal:        General: Normal range of motion.     Right lower leg: Edema present.     Left lower leg: Edema present.  Skin:    General: Skin is warm and dry.  Neurological:     Mental Status: He is alert and oriented to person, place, and time. Mental status is at baseline.  Psychiatric:        Mood and Affect: Mood normal.        Behavior: Behavior normal.      LABORATORY DATA:  I have reviewed  the labs as listed.  CBC    Component Value Date/Time   WBC 4.4 05/18/2019 1021   RBC 3.27 (L) 05/18/2019 1021   HGB 10.0 (L) 05/18/2019 1021   HGB 10.0 (L) 04/01/2018 1019   HCT 31.7 (L) 05/18/2019 1021   HCT 28.6 (L) 04/01/2018 1019   PLT 208 05/18/2019 1021   PLT 290 04/01/2018 1019   MCV 96.9 05/18/2019 1021   MCV 96 04/01/2018 1019   MCH 30.6 05/18/2019 1021   MCHC 31.5 05/18/2019 1021   RDW 14.7 05/18/2019 1021   RDW 12.2 04/01/2018 1019   LYMPHSABS 0.7 05/18/2019 1021   LYMPHSABS 1.1 11/15/2016 0815   MONOABS 0.7 05/18/2019 1021   EOSABS 0.2 05/18/2019 1021   EOSABS 0.2 11/15/2016 0815   BASOSABS 0.0 05/18/2019 1021   BASOSABS 0.0 11/15/2016 0815   CMP Latest Ref Rng & Units 05/18/2019 04/20/2019 03/23/2019  Glucose 70 - 99 mg/dL 204(H) 236(H) 169(H)  BUN 8 - 23 mg/dL 136(H) 133(H) 148(H)  Creatinine 0.61 - 1.24 mg/dL 6.98(H) 6.73(H) 6.75(H)  Sodium 135 - 145 mmol/L 139 137 138  Potassium 3.5 - 5.1 mmol/L 3.8 4.0 4.0  Chloride 98 -  111 mmol/L 102 103 102  CO2 22 - 32 mmol/L '22 22 22  '$ Calcium 8.9 - 10.3 mg/dL 8.8(L) 8.9 8.9  Total Protein 6.5 - 8.1 g/dL - - -  Total Bilirubin 0.3 - 1.2 mg/dL - - -  Alkaline Phos 38 - 126 U/L - - -  AST 15 - 41 U/L - - -  ALT 0 - 44 U/L - - -       DIAGNOSTIC IMAGING:  I have reviewed scans.    ASSESSMENT & PLAN:   MGUS (monoclonal gammopathy of unknown significance) 1.  IgG lambda MGUS: -BM BX on 09/29/2018 done secondary to worsening kidney function showed hypercellular marrow with 10% lambda restricted plasma cells.  Chromosome analysis and FISH panel were normal. -PET scan on 10/13/2018 did not show any evidence of myeloma. -Kidney biopsy on 11/21/2018 showed severe diabetic nephropathy with nodular glomerulosclerosis, severe arteriolar hyalinosis, focal and segmental glomerular tuft scarring in association with severe arterial sclerosis and diffuse severe interstitial fibrosis and tubular atrophy.  No evidence of monoclonal  immunoglobulin deposition disease or an immune complex mediated glomerulonephritis. -I reviewed labs from 05/18/2019.  M spike is stable at 0.4 g.  Calcium is normal at 8.8.  Free lambda light chain is 134.7 and stable.  Free kappa light chain is 158 with ratio of 1.18.  Creatinine is slightly worse at 6.98. -He has a left arm fistula for dialysis in the future. -I plan to see him back in 4 months with repeat myeloma labs.  We will also do metastatic skeletal survey.  2.  Leg swelling: -He will continue Lasix 80 mg twice daily.  3.  Normocytic anemia: -This is from CKD and iron deficiency. -He received Feraheme today.  His labs show a percent saturation of 10 and ferritin of 171 on 05/18/2019. -He is also receiving Retacrit 20,000 units as needed.  Hemoglobin is 10.0.     Orders placed this encounter:  Orders Placed This Encounter  Procedures  . DG Bone Survey Met  . CBC with Differential/Platelet  . Comprehensive metabolic panel  . Protein electrophoresis, serum  . Kappa/lambda light chains  . Lactate dehydrogenase      Derek Jack, MD [place] [phone]

## 2019-05-26 ENCOUNTER — Encounter (INDEPENDENT_AMBULATORY_CARE_PROVIDER_SITE_OTHER): Payer: Self-pay | Admitting: Ophthalmology

## 2019-05-26 ENCOUNTER — Ambulatory Visit (INDEPENDENT_AMBULATORY_CARE_PROVIDER_SITE_OTHER): Payer: Medicare Other | Admitting: Ophthalmology

## 2019-05-26 DIAGNOSIS — E113512 Type 2 diabetes mellitus with proliferative diabetic retinopathy with macular edema, left eye: Secondary | ICD-10-CM | POA: Diagnosis not present

## 2019-05-26 DIAGNOSIS — E113511 Type 2 diabetes mellitus with proliferative diabetic retinopathy with macular edema, right eye: Secondary | ICD-10-CM

## 2019-05-26 DIAGNOSIS — H4311 Vitreous hemorrhage, right eye: Secondary | ICD-10-CM

## 2019-05-26 DIAGNOSIS — H2511 Age-related nuclear cataract, right eye: Secondary | ICD-10-CM | POA: Diagnosis not present

## 2019-05-26 DIAGNOSIS — E113551 Type 2 diabetes mellitus with stable proliferative diabetic retinopathy, right eye: Secondary | ICD-10-CM | POA: Insufficient documentation

## 2019-05-26 DIAGNOSIS — H2512 Age-related nuclear cataract, left eye: Secondary | ICD-10-CM | POA: Diagnosis not present

## 2019-05-26 HISTORY — DX: Age-related nuclear cataract, right eye: H25.11

## 2019-05-26 HISTORY — DX: Age-related nuclear cataract, left eye: H25.12

## 2019-05-26 HISTORY — DX: Vitreous hemorrhage, right eye: H43.11

## 2019-05-26 MED ORDER — AFLIBERCEPT 2MG/0.05ML IZ SOLN FOR KALEIDOSCOPE
2.0000 mg | INTRAVITREAL | Status: AC | PRN
  Administered 2019-05-26: 2 mg via INTRAVITREAL

## 2019-05-26 NOTE — Assessment & Plan Note (Signed)
The nature of diabetic macular edema was discussed with the patient. Treatment options were outlined including medical therapy, laser & vitrectomy. The use of injectable medications reviewed, including Avastin, Lucentis, and Eylea. Periodic injections into the eye are likely to resolve diabetic macular edema (swelling in the center of vision). Initially, injections are delivered are delivered every 4-6 weeks, and the interval extended as the condition improves. On average, 8-9 injections the first year, and 5 in year 2. Improvement in the condition most often improves on medical therapy. Occasional use of focal laser is also recommended for residual macular edema (swelling). Excellent control of blood glucose and blood pressure are encouraged under the care of a primary physician or endocrinologist. Similarly, attempts to maintain serum cholesterol, low density lipoproteins, and high-density lipoproteins in a favorable range were recommended.

## 2019-05-26 NOTE — Assessment & Plan Note (Signed)

## 2019-05-26 NOTE — Progress Notes (Signed)
05/26/2019     CHIEF COMPLAINT Patient presents for Diabetic Eye Exam   HISTORY OF PRESENT ILLNESS: Henry Carroll is a 69 y.o. male who presents to the clinic today for:   HPI    Diabetic Eye Exam    Vision is blurred for distance.  Associated Symptoms Negative for Flashes, Floaters and Pain.  Diabetes characteristics include Type 2 and on insulin.  Blood sugar level is controlled.  Last Blood Glucose 204.  Last A1C 7.3.          Comments    5 Week Diabetic Exam OS. Possible Eylea OS. OCT  Pt c/o occasional blurry distance vision in OU. Denies FOL and floaters. Pt is currently taking a steroid.  BGL: 204 A1C: 7.3 last week       Last edited by Hurman Horn, MD on 05/26/2019 10:41 AM. (History)      Referring physician: [name], NP [place],  [place] [address]  HISTORICAL INFORMATION:   Selected notes from the MEDICAL RECORD NUMBER    Lab Results  Component Value Date   HGBA1C 7.5 (H) 05/20/2019     CURRENT MEDICATIONS: No current outpatient medications on file. (Ophthalmic Drugs)   No current facility-administered medications for this visit. (Ophthalmic Drugs)   Current Outpatient Medications (Other)  Medication Sig  . acetaminophen (TYLENOL) 500 MG tablet Take 1,000 mg by mouth 2 (two) times daily as needed for moderate pain.  Marland Kitchen albuterol (PROVENTIL) (2.5 MG/3ML) 0.083% nebulizer solution USE ONE VIAL IN NEBULIZER EVERY 6 HOURS AS NEEDED FOR WHEEZING FOR SHORTNESS OF BREATH (Patient not taking: Reported on 05/25/2019)  . albuterol (VENTOLIN HFA) 108 (90 Base) MCG/ACT inhaler Inhale 2 puffs into the lungs every 6 (six) hours as needed for wheezing or shortness of breath.  Marland Kitchen amLODipine (NORVASC) 10 MG tablet Take 1 tablet by mouth once daily  . aspirin EC 81 MG tablet Take 1 tablet (81 mg total) by mouth daily.  Marland Kitchen atorvastatin (LIPITOR) 40 MG tablet TAKE 1 TABLET BY MOUTH ONCE DAILY IN THE EVENING  . carvedilol (COREG) 25 MG tablet  TAKE 1 TABLET BY MOUTH TWICE DAILY WITH A MEAL  . Cholecalciferol (VITAMIN D3) 50 MCG (2000 UT) TABS Take 4,000 Units by mouth daily.  . Cinnamon 500 MG capsule Take 1,000 mg by mouth daily.   . Coenzyme Q10 (CO Q 10 PO) Take 200 mg by mouth daily.   Marland Kitchen epoetin alfa-epbx (RETACRIT) 19147 UNIT/ML injection 20,000 Units every 14 (fourteen) days.   . fluticasone (FLONASE) 50 MCG/ACT nasal spray Place 2 sprays into both nostrils daily as needed for allergies or rhinitis (SEASONAL ALLERGIES).  . Fluticasone-Salmeterol (ADVAIR) 500-50 MCG/DOSE AEPB Inhale 1 puff into the lungs 2 (two) times daily as needed (respiratory issues.).  Marland Kitchen furosemide (LASIX) 40 MG tablet Take 2 tablets by mouth twice daily  . glucose blood (ONE TOUCH ULTRA TEST) test strip Use to test blood sugar three times daily. Dx: E11.21  . hydrALAZINE (APRESOLINE) 50 MG tablet Take 50 mg by mouth 2 (two) times daily.  . insulin detemir (LEVEMIR) 100 UNIT/ML injection Inject 0.15 mLs (15 Units total) into the skin daily. In the morning  . isosorbide mononitrate (IMDUR) 30 MG 24 hr tablet Take 1 tablet by mouth once daily  . metolazone (ZAROXOLYN) 5 MG tablet Take 5 mg by mouth 2 (two) times a week. Tuesday and Saturday only   . montelukast (SINGULAIR) 10 MG tablet Take 10 mg  by mouth at bedtime.  . Multiple Vitamin (MULTIVITAMIN WITH MINERALS) TABS tablet Take 1 tablet by mouth daily.  . nitroGLYCERIN (NITROSTAT) 0.4 MG SL tablet Place 1 tablet (0.4 mg total) under the tongue every 5 (five) minutes as needed for chest pain. (Patient not taking: Reported on 05/25/2019)  . predniSONE (STERAPRED UNI-PAK 21 TAB) 10 MG (21) TBPK tablet Use as directed  . PROAIR HFA 108 (90 Base) MCG/ACT inhaler INHALE 2 PUFFS INTO LUNGS EVERY 6 HOURS AS NEEDED FOR SHORTNESS OF BREATH (Patient not taking: Reported on 05/25/2019)   No current facility-administered medications for this visit. (Other)   Facility-Administered Medications Ordered in Other Visits  (Other)  Medication Route  . regadenoson (LEXISCAN) injection SOLN 0.4 mg Intravenous      REVIEW OF SYSTEMS: ROS    Positive for: Endocrine   Last edited by Tilda Franco on 05/26/2019  9:19 AM. (History)       ALLERGIES No Known Allergies  PAST MEDICAL HISTORY Past Medical History:  Diagnosis Date  . Abnormal stress test 02/10/2015  . Acute on chronic systolic and diastolic heart failure, NYHA class 1 ([place]) 12/31/2014  . Anemia   . Anemia in chronic kidney disease 09/03/2012  . 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 Asthma   . Asthma, chronic 06/04/2012  . Asthma, chronic, mild intermittent, with acute exacerbation 12/22/2015  . Bilateral lower extremity edema 12/22/2015  . CAD (coronary artery disease) 02/18/2015  . Chronic combined systolic (congestive) and diastolic (congestive) heart failure ([place])    a. 12/31/14: 2D ECHO: EF 40-45%, HK of inf myocardium, G1DD, mod MR  . Chronic combined systolic and diastolic CHF (congestive heart failure) ([place]) 02/10/2015  . Chronic systolic heart failure ([place]) 02/25/2015  . CKD (chronic kidney disease), stage III    stage 3 kidney disease  . CKD (chronic kidney disease), stage V ([place]) 08/30/2018  . COLONIC POLYPS, HX OF 04/13/2009   Qualifier: Diagnosis of  By: West[place] Pam.   . Coronary artery disease    a. LHC 01/2015 - triple vessel CAD (mod oLM, mLAD, severe mRCA, intermediate branch stenosis, CTO of mCx). Plan CABG 02/2015.  Marland Kitchen Demand ischemia ([place]) 12/31/2014  . DM type 2, uncontrolled, with renal complications ([place]) 0/92/3300  . Dyspnea     due to fluid  . Elevated troponin   . Essential hypertension 10/09/2013  . Folliculitis of perineum 10/01/2012  . Gastric erosion   . GERD (gastroesophageal reflux disease)   . History of blood transfusion   . Hyperlipidemia   . Hyperlipidemia with target LDL less than 100 12/31/2014  . Hypertension   . Hypertensive heart disease with heart failure ([place]) 02/25/2015  . IDA (iron  deficiency anemia) 01/23/2016  . Melena 01/23/2016  . 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 Mitral regurgitation    a. Mild-mod by echo 12/2014.  . Morbid obesity ([place]) 12/29/2014  . Myocardial infarction [facility])    pt. states per [name] he has in the past  . NSVT (nonsustained ventricular tachycardia) ([place])    a. 9 beats during 01/2015 adm. BB titrated.  . Obesity    a. BMI 33  . Obesity (BMI 30-39.9) 05/05/2013  . OSA (obstructive sleep apnea) 01/16/2018   Mild obstructive sleep apnea overall with an AHI of 7.3/h and no significant central sleep apnea. Severe obstructive sleep apnea during REM sleep with an AHI of 34.3/h.  Now on CPAP at 6 cm H2O.   Marland Kitchen  Other malaise and fatigue 09/03/2012  . 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 PAD (peripheral artery disease) ([place]) 03/02/2015  . Pneumonia   . Sleep apnea    2019, Dr.Turner diagnoised   . Small bowel lesion   . Symptomatic anemia 08/29/2018  . 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 Type II diabetes mellitus ([place])   . Wears dentures    Past Surgical History:  Procedure Laterality Date  . BACK SURGERY    . BASCILIC VEIN TRANSPOSITION Left 11/14/2018   Procedure: FIRST STAGE BASILIC VEIN TRANSPOSITION LEFT ARM;  Surgeon: Serafina Mitchell, MD;  Location: [facility];  Service: Vascular;  Laterality: Left;  . BASCILIC VEIN TRANSPOSITION Left 01/28/2019   Procedure: BASILIC VEIN TRANSPOSITION SECOND STAGE;  Surgeon: Serafina Mitchell, MD;  Location: [facility];  Service: Vascular;  Laterality: Left;  . BONE MARROW BIOPSY    . CARDIAC CATHETERIZATION N/A 02/09/2015   Procedure: Left Heart Cath and Coronary Angiography;  Surgeon: Burnell Blanks, MD;  Location: [facility];  Service: Cardiovascular;  Laterality: N/A;  . COLONOSCOPY  2011   Dr. [name]: Ileocecal valve appeared normal, scattered pancolonic diverticulosis, difficult bowel prep making smaller lesions potentially missed. Recommended  three-year follow-up colonoscopy.  . COLONOSCOPY  2003   Dr. [name]: Suspicious lesion at the ileocecal valve, multiple biopsies benign, pancolonic diverticulosis.  . COLONOSCOPY N/A 02/15/2016   diverticulosis in sigmoid and descending colon, single 5 mm polyp at splenic flexure (Tubular adenoma)  . CORONARY ARTERY BYPASS GRAFT N/A 02/18/2015   Procedure: CORONARY ARTERY BYPASS GRAFTING (CABG) x  four, using bilateral internal mammary arteries and right leg greater saphenous vein harvested endoscopically;  Surgeon: [name], MD;  Location: [facility];  Service: Open Heart Surgery;  Laterality: N/A;  . ESOPHAGOGASTRODUODENOSCOPY N/A 02/15/2016   normal  . EYE SURGERY Right    July 2019   . GIVENS CAPSULE STUDY N/A 01/08/2017   couple of gastric and small bowel eroions in setting of aspirin 81 mg daily but nothing concerning, continue Hematology follow-up  . HERNIA REPAIR  0488   Umbilical  . LUMBAR [place] SURGERY  2006   L4 & L5  . REFRACTIVE SURGERY Left    2019   . REFRACTIVE SURGERY Right    2019  . TEE WITHOUT CARDIOVERSION N/A 02/18/2015   Procedure: TRANSESOPHAGEAL ECHOCARDIOGRAM (TEE);  Surgeon: [name], MD;  Location: [facility];  Service: Open Heart Surgery;  Laterality: N/A;  . TONSILLECTOMY  1962    FAMILY HISTORY Family History  Problem Relation Age of Onset  . Diabetes Mother   . Heart disease Mother        later in life, age >5  . Heart disease Father        heart failure later in life  . Hypertension Father   . Stroke Sister   . Kidney disease Daughter   . Sudden death Daughter   . Colon cancer Paternal Uncle        Passed from colon CA  . Heart attack Neg Hx     SOCIAL HISTORY Social History   Tobacco Use  . Smoking status: Former Smoker    Years: 17.00    Types: Cigars    Quit date: 12/31/2014    Years since quitting: 4.4  . Smokeless tobacco: Never Used  Substance Use Topics  . Alcohol use: Yes    Alcohol/week: 3.0 standard drinks    Types: 3 Glasses  of wine per week  Comment: occasional glass of wine.  . Drug use: No         OPHTHALMIC EXAM:  Base Eye Exam    Visual Acuity (Snellen - Linear)      Right Left   Dist [place] 20/40 + 20/40 +   Dist ph [place] 20/30 -1 20/30       Tonometry (Tonopen, 9:26 AM)      Right Left   Pressure 15 16       Pupils      Pupils Dark Light Shape React APD   Right PERRL 3 3 Round Minimal +1   Left PERRL 3 3 Round Minimal None       Visual Fields (Counting fingers)      Left Right    Full Full       Neuro/Psych    Oriented x3: Yes   Mood/Affect: Normal       Dilation    Left eye: 1.0% Mydriacyl, 2.5% Phenylephrine @ 9:26 AM        Slit Lamp and Fundus Exam    External Exam      Right Left   External Normal Normal       Slit Lamp Exam      Right Left   Lids/Lashes Normal Normal   Conjunctiva/Sclera White and quiet White and quiet   Cornea Clear Clear   Anterior Chamber Deep and quiet Deep and quiet   Iris Round and reactive Round and reactive   Lens 2+ Nuclear sclerosis 2+ Nuclear sclerosis   Anterior Vitreous Normal Normal       Fundus Exam      Right Left   Posterior Vitreous  Normal   Disc  Normal   C/D Ratio  0.4   Macula  Microaneurysms, Clinically significant macular edema, Mild clinically significant macular edema   Vessels  Quiet proliferative diabetic retinopathy   Periphery  Normal          IMAGING AND PROCEDURES  Imaging and Procedures for 05/26/19  OCT, Retina - OU - Both Eyes       Right Eye Quality was good. Scan locations included subfoveal. Progression has improved.   Left Eye Quality was good. Scan locations included subfoveal. Progression has improved.   Notes Clinically significant macular edema OD some 4 weeks after antiveg F, Eylea, improved  OS, clinically significant macular edema some 5 weeks after Eylea, improved dramatically       Intravitreal Injection, Pharmacologic Agent - OS - Left Eye       Time Out 05/26/2019.  10:42 AM. Confirmed correct patient, procedure, site, and patient consented.   Anesthesia Topical anesthesia was used. Anesthetic medications included Akten 3.5%.   Procedure Preparation included Tobramycin 0.3%, Ofloxacin , 10% betadine to eyelids. A 30 gauge needle was used.   Injection:  2 mg aflibercept Alfonse Flavors) SOLN   NDC: 00867-619-50   Route: Intravitreal, Site: Left Eye, Waste: 0 mL  Post-op Post injection exam found visual acuity of at least counting fingers. The patient tolerated the procedure well. There were no complications. The patient received written and verbal post procedure care education. Post injection medications were not given.                 ASSESSMENT/PLAN:  _0 @    ICD-10-CM   1. 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 Diabetic macular edema of right eye with proliferative retinopathy associated with type 2 diabetes mellitus (HCC)  E11.3511 OCT, Retina - OU - Both Eyes  3. Vitreous hemorrhage of right eye ([place])  H43.11   4. Nuclear sclerotic cataract of right eye  H25.11   5. Nuclear sclerotic cataract of left eye  H25.12     1.  2.  3.  Ophthalmic Meds Ordered this visit:  Meds ordered this encounter  Medications  . aflibercept (EYLEA) SOLN 2 mg       Return in about 6 weeks (around 07/07/2019) for EYLEA OCT, OS.  There are no Patient Instructions on file for this visit.   Explained the diagnoses, plan, and follow up with the patient and they expressed understanding.  Patient expressed understanding of the importance of proper follow up care.   Clent Demark Aspynn Clover M.D. Diseases & Surgery of the Retina and Vitreous Retina & Diabetic [place] 05/26/19     Abbreviations: M myopia (nearsighted); A astigmatism; H  hyperopia (farsighted); P presbyopia; Mrx spectacle prescription;  CTL contact lenses; OD right eye; OS left eye; OU both eyes  XT exotropia; ET esotropia; PEK punctate epithelial keratitis; PEE punctate epithelial erosions; DES dry eye syndrome; MGD meibomian gland dysfunction; ATs artificial tears; PFAT's preservative free artificial tears; [place] nuclear sclerotic cataract; PSC posterior subcapsular cataract; ERM epi-retinal membrane; PVD posterior vitreous detachment; RD retinal detachment; DM diabetes mellitus; DR diabetic retinopathy; NPDR non-proliferative diabetic retinopathy; PDR proliferative diabetic retinopathy; CSME clinically significant macular edema; DME diabetic macular edema; dbh dot blot hemorrhages; CWS cotton wool spot; POAG primary open angle glaucoma; C/D cup-to-disc ratio; HVF humphrey visual field; GVF goldmann visual field; OCT optical coherence tomography; IOP intraocular pressure; BRVO Branch retinal vein occlusion; CRVO central retinal vein occlusion; CRAO central retinal artery occlusion; BRAO branch retinal artery occlusion; RT retinal tear; SB scleral buckle; PPV pars plana vitrectomy; VH Vitreous hemorrhage; PRP panretinal laser photocoagulation; IVK intravitreal kenalog; VMT vitreomacular traction; MH Macular hole;  NVD neovascularization of the disc; NVE neovascularization elsewhere; AREDS age related eye disease study; ARMD age related macular degeneration; POAG primary open angle glaucoma; EBMD epithelial/anterior basement membrane dystrophy; ACIOL anterior chamber intraocular lens; IOL intraocular lens; PCIOL posterior chamber intraocular lens; Phaco/IOL phacoemulsification with intraocular lens placement; [place] photorefractive keratectomy; LASIK laser assisted in situ keratomileusis; HTN hypertension; DM diabetes mellitus; COPD chronic obstructive pulmonary disease

## 2019-05-28 ENCOUNTER — Other Ambulatory Visit: Payer: Self-pay

## 2019-05-28 ENCOUNTER — Encounter (INDEPENDENT_AMBULATORY_CARE_PROVIDER_SITE_OTHER): Payer: Self-pay | Admitting: Ophthalmology

## 2019-05-28 ENCOUNTER — Ambulatory Visit (INDEPENDENT_AMBULATORY_CARE_PROVIDER_SITE_OTHER): Payer: Medicare Other | Admitting: Ophthalmology

## 2019-05-28 DIAGNOSIS — H2511 Age-related nuclear cataract, right eye: Secondary | ICD-10-CM

## 2019-05-28 DIAGNOSIS — E113512 Type 2 diabetes mellitus with proliferative diabetic retinopathy with macular edema, left eye: Secondary | ICD-10-CM | POA: Diagnosis not present

## 2019-05-28 DIAGNOSIS — E113511 Type 2 diabetes mellitus with proliferative diabetic retinopathy with macular edema, right eye: Secondary | ICD-10-CM

## 2019-05-28 MED ORDER — AFLIBERCEPT 2MG/0.05ML IZ SOLN FOR KALEIDOSCOPE
2.0000 mg | INTRAVITREAL | Status: AC | PRN
  Administered 2019-05-28: 10:00:00 2 mg via INTRAVITREAL

## 2019-05-28 NOTE — Progress Notes (Signed)
05/28/2019     CHIEF COMPLAINT Patient presents for Retina Follow Up   HISTORY OF PRESENT ILLNESS: Henry Carroll is a 69 y.o. 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  Duration of 5 weeks.          Comments    5 week f.u - OCT OU, Possible Eylea OD Patient denies change in vision and overall has no complaints. LBS 119 this AM A1C 7.3 05/2019       Last edited by Gerda Diss, COT on 05/28/2019  9:10 AM. (History)      Referring physician: [name], NP [place],  [place] [id number]  HISTORICAL INFORMATION:   Selected notes from the MEDICAL RECORD NUMBER    Lab Results  Component Value Date   HGBA1C 7.5 (H) 05/20/2019     CURRENT MEDICATIONS: No current outpatient medications on file. (Ophthalmic Drugs)   No current facility-administered medications for this visit. (Ophthalmic Drugs)   Current Outpatient Medications (Other)  Medication Sig  . acetaminophen (TYLENOL) 500 MG tablet Take 1,000 mg by mouth 2 (two) times daily as needed for moderate pain.  Marland Kitchen albuterol (PROVENTIL) (2.5 MG/3ML) 0.083% nebulizer solution USE ONE VIAL IN NEBULIZER EVERY 6 HOURS AS NEEDED FOR WHEEZING FOR SHORTNESS OF BREATH  . albuterol (VENTOLIN HFA) 108 (90 Base) MCG/ACT inhaler Inhale 2 puffs into the lungs every 6 (six) hours as needed for wheezing or shortness of breath.  Marland Kitchen amLODipine (NORVASC) 10 MG tablet Take 1 tablet by mouth once daily  . aspirin EC 81 MG tablet Take 1 tablet (81 mg total) by mouth daily.  Marland Kitchen atorvastatin (LIPITOR) 40 MG tablet TAKE 1 TABLET BY MOUTH ONCE DAILY IN THE EVENING  . carvedilol (COREG) 25 MG tablet TAKE 1 TABLET BY MOUTH TWICE DAILY WITH A MEAL  . Cholecalciferol (VITAMIN D3) 50 MCG (2000 UT) TABS Take 4,000 Units by mouth daily.  . Cinnamon 500 MG capsule Take 1,000 mg by mouth daily.   . Coenzyme Q10 (CO Q 10 PO) Take 200 mg by  mouth daily.   Marland Kitchen epoetin alfa-epbx (RETACRIT) 38453 UNIT/ML injection 20,000 Units every 14 (fourteen) days.   . fluticasone (FLONASE) 50 MCG/ACT nasal spray Place 2 sprays into both nostrils daily as needed for allergies or rhinitis (SEASONAL ALLERGIES).  . Fluticasone-Salmeterol (ADVAIR) 500-50 MCG/DOSE AEPB Inhale 1 puff into the lungs 2 (two) times daily as needed (respiratory issues.).  Marland Kitchen furosemide (LASIX) 40 MG tablet Take 2 tablets by mouth twice daily  . glucose blood (ONE TOUCH ULTRA TEST) test strip Use to test blood sugar three times daily. Dx: E11.21  . hydrALAZINE (APRESOLINE) 50 MG tablet Take 50 mg by mouth 2 (two) times daily.  . insulin detemir (LEVEMIR) 100 UNIT/ML injection Inject 0.15 mLs (15 Units total) into the skin daily. In the morning  . isosorbide mononitrate (IMDUR) 30 MG 24 hr tablet Take 1 tablet by mouth once daily  . metolazone (ZAROXOLYN) 5 MG tablet Take 5 mg by mouth 2 (two) times a week. Tuesday and Saturday only   . montelukast (SINGULAIR) 10 MG tablet Take 10 mg by mouth at bedtime.  . Multiple Vitamin (MULTIVITAMIN WITH MINERALS) TABS tablet Take 1 tablet by mouth daily.  . nitroGLYCERIN (NITROSTAT) 0.4 MG SL tablet Place 1 tablet (0.4 mg total) under the tongue every 5 (  five) minutes as needed for chest pain.  . predniSONE (STERAPRED UNI-PAK 21 TAB) 10 MG (21) TBPK tablet Use as directed  . PROAIR HFA 108 (90 Base) MCG/ACT inhaler INHALE 2 PUFFS INTO LUNGS EVERY 6 HOURS AS NEEDED FOR SHORTNESS OF BREATH   No current facility-administered medications for this visit. (Other)   Facility-Administered Medications Ordered in Other Visits (Other)  Medication Route  . regadenoson (LEXISCAN) injection SOLN 0.4 mg Intravenous      REVIEW OF SYSTEMS:    ALLERGIES No Known Allergies  PAST MEDICAL HISTORY Past Medical History:  Diagnosis Date  . Abnormal stress test 02/10/2015  . Acute on chronic systolic and diastolic heart failure, NYHA class 1 ([place])  12/31/2014  . Anemia   . Anemia in chronic kidney disease 09/03/2012  . 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 Asthma   . Asthma, chronic 06/04/2012  . Asthma, chronic, mild intermittent, with acute exacerbation 12/22/2015  . Bilateral lower extremity edema 12/22/2015  . CAD (coronary artery disease) 02/18/2015  . Chronic combined systolic (congestive) and diastolic (congestive) heart failure ([place])    a. 12/31/14: 2D ECHO: EF 40-45%, HK of inf myocardium, G1DD, mod MR  . Chronic combined systolic and diastolic CHF (congestive heart failure) ([place]) 02/10/2015  . Chronic systolic heart failure ([place]) 02/25/2015  . CKD (chronic kidney disease), stage III    stage 3 kidney disease  . CKD (chronic kidney disease), stage V ([place]) 08/30/2018  . COLONIC POLYPS, HX OF 04/13/2009   Qualifier: Diagnosis of  By: West[place] Pam.   . Coronary artery disease    a. LHC 01/2015 - triple vessel CAD (mod oLM, mLAD, severe mRCA, intermediate branch stenosis, CTO of mCx). Plan CABG 02/2015.  Marland Kitchen Demand ischemia ([place]) 12/31/2014  . DM type 2, uncontrolled, with renal complications ([place]) 05/10/9240  . Dyspnea     due to fluid  . Elevated troponin   . Essential hypertension 10/09/2013  . Folliculitis of perineum 10/01/2012  . Gastric erosion   . GERD (gastroesophageal reflux disease)   . History of blood transfusion   . Hyperlipidemia   . Hyperlipidemia with target LDL less than 100 12/31/2014  . Hypertension   . Hypertensive heart disease with heart failure ([place]) 02/25/2015  . IDA (iron deficiency anemia) 01/23/2016  . Melena 01/23/2016  . 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 Mitral regurgitation    a. Mild-mod by echo 12/2014.  . Morbid obesity ([place]) 12/29/2014  . Myocardial infarction [facility])    pt. states per [name] he has in the past  . NSVT (nonsustained ventricular tachycardia) ([place])    a. 9 beats during 01/2015 adm. BB titrated.  . Obesity    a. BMI 33  . Obesity (BMI  30-39.9) 05/05/2013  . OSA (obstructive sleep apnea) 01/16/2018   Mild obstructive sleep apnea overall with an AHI of 7.3/h and no significant central sleep apnea. Severe obstructive sleep apnea during REM sleep with an AHI of 34.3/h.  Now on CPAP at 6 cm H2O.   . Other malaise and fatigue 09/03/2012  . 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 PAD (peripheral artery disease) ([place]) 03/02/2015  . Pneumonia   . Sleep apnea    2019, Dr.Turner diagnoised   . Small bowel lesion   . Symptomatic anemia 08/29/2018  . 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 Type II diabetes mellitus ([place])   . Wears dentures    Past Surgical History:  Procedure Laterality Date  .  BACK SURGERY    . BASCILIC VEIN TRANSPOSITION Left 11/14/2018   Procedure: FIRST STAGE BASILIC VEIN TRANSPOSITION LEFT ARM;  Surgeon: Serafina Mitchell, MD;  Location: [facility];  Service: Vascular;  Laterality: Left;  . BASCILIC VEIN TRANSPOSITION Left 01/28/2019   Procedure: BASILIC VEIN TRANSPOSITION SECOND STAGE;  Surgeon: Serafina Mitchell, MD;  Location: [facility];  Service: Vascular;  Laterality: Left;  . BONE MARROW BIOPSY    . CARDIAC CATHETERIZATION N/A 02/09/2015   Procedure: Left Heart Cath and Coronary Angiography;  Surgeon: Burnell Blanks, MD;  Location: [facility];  Service: Cardiovascular;  Laterality: N/A;  . COLONOSCOPY  2011   Dr. [name]: Ileocecal valve appeared normal, scattered pancolonic diverticulosis, difficult bowel prep making smaller lesions potentially missed. Recommended three-year follow-up colonoscopy.  . COLONOSCOPY  2003   Dr. [name]: Suspicious lesion at the ileocecal valve, multiple biopsies benign, pancolonic diverticulosis.  . COLONOSCOPY N/A 02/15/2016   diverticulosis in sigmoid and descending colon, single 5 mm polyp at splenic flexure (Tubular adenoma)  . CORONARY ARTERY BYPASS GRAFT N/A 02/18/2015   Procedure: CORONARY ARTERY BYPASS GRAFTING (CABG) x  four, using bilateral internal mammary  arteries and right leg greater saphenous vein harvested endoscopically;  Surgeon: [name], MD;  Location: [facility];  Service: Open Heart Surgery;  Laterality: N/A;  . ESOPHAGOGASTRODUODENOSCOPY N/A 02/15/2016   normal  . EYE SURGERY Right    July 2019   . GIVENS CAPSULE STUDY N/A 01/08/2017   couple of gastric and small bowel eroions in setting of aspirin 81 mg daily but nothing concerning, continue Hematology follow-up  . HERNIA REPAIR  [id number]   Umbilical  . LUMBAR [place] SURGERY  2006   L4 & L5  . REFRACTIVE SURGERY Left    2019   . REFRACTIVE SURGERY Right    2019  . TEE WITHOUT CARDIOVERSION N/A 02/18/2015   Procedure: TRANSESOPHAGEAL ECHOCARDIOGRAM (TEE);  Surgeon: [name], MD;  Location: [facility];  Service: Open Heart Surgery;  Laterality: N/A;  . TONSILLECTOMY  1962    FAMILY HISTORY Family History  Problem Relation Age of Onset  . Diabetes Mother   . Heart disease Mother        later in life, age >44  . Heart disease Father        heart failure later in life  . Hypertension Father   . Stroke Sister   . Kidney disease Daughter   . Sudden death Daughter   . Colon cancer Paternal Uncle        Passed from colon CA  . Heart attack Neg Hx     SOCIAL HISTORY Social History   Tobacco Use  . Smoking status: Former Smoker    Years: 17.00    Types: Cigars    Quit date: 12/31/2014    Years since quitting: 4.4  . Smokeless tobacco: Never Used  Substance Use Topics  . Alcohol use: Yes    Alcohol/week: 3.0 standard drinks    Types: 3 Glasses of wine per week    Comment: occasional glass of wine.  . Drug use: No         OPHTHALMIC EXAM:  Base Eye Exam    Visual Acuity (Snellen - Linear)      Right Left   Dist [place] 20/30-1 20/25   Dist [place] 20/30+2        Tonometry (Tonopen, 9:16 AM)      Right Left   Pressure 14 13  Pupils      Pupils Dark Light Shape React APD   Right PERRL 4 3 Round Slow None   Left PERRL 4 3 Round Slow None       Visual  Fields (Counting fingers)      Left Right    Full Full       Extraocular Movement      Right Left    Full Full       Neuro/Psych    Oriented x3: Yes   Mood/Affect: Normal       Dilation    Right eye: 1.0% Mydriacyl, 2.5% Phenylephrine @ 9:16 AM        Slit Lamp and Fundus Exam    External Exam      Right Left   External Normal Normal       Slit Lamp Exam      Right Left   Lids/Lashes Normal Normal   Conjunctiva/Sclera White and quiet White and quiet   Cornea Clear Clear   Anterior Chamber Deep and quiet Deep and quiet   Iris Round and reactive Round and reactive   Lens 2+ Nuclear sclerosis 2+ Nuclear sclerosis   Anterior Vitreous Normal Normal       Fundus Exam      Right Left   Posterior Vitreous Normal    Disc Normal    C/D Ratio 0.2    Macula Macular thickening, Clinically significant macular edema, Mild clinically significant macular edema, Exudates, Microaneurysms    Vessels Quiet proliferative diabetic retinopathy    Periphery Normal           IMAGING AND PROCEDURES  Imaging and Procedures for 05/28/19  OCT, Retina - OU - Both Eyes       Right Eye Quality was good. Scan locations included subfoveal. Central Foveal Thickness: 335. Progression has been stable.   Left Eye Quality was good. Scan locations included subfoveal. Central Foveal Thickness: 332. Progression has improved.   Notes Clinically significant macular edema overall improved OD, on Eylea, at 5-week interval repeat today  OS clinically significant macular edema also improved substantially, on Eylea       Intravitreal Injection, Pharmacologic Agent - OD - Right Eye       Time Out 05/28/2019. 10:20 AM. Confirmed correct patient, procedure, site, and patient consented.   Anesthesia Topical anesthesia was used. Anesthetic medications included [place] 3.5%.   Procedure Preparation included Ofloxacin , 5% betadine to ocular surface, 10% betadine to eyelids. A 30 gauge needle  was used.   Injection:  2 mg aflibercept Alfonse Flavors) SOLN   NDC: [id number], Lot: [id number]   Route: Intravitreal, Site: Right Eye, Waste: 0 mg  Post-op Post injection exam found visual acuity of at least counting fingers. The patient tolerated the procedure well. There were no complications. The patient received written and verbal post procedure care education. Post injection medications were not given.                 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 Treatment options were outlined including medical therapy, laser & vitrectomy. The use of injectable medications reviewed, including Avastin, Lucentis, and Eylea. Periodic injections into the eye are likely to resolve diabetic macular edema (swelling in the center of vision). Initially, injections are delivered are delivered every 4-6 weeks, and the interval extended as the condition improves. On  average, 8-9 injections the first year, and 5 in year 2. Improvement in the condition most often improves on medical therapy. Occasional use of focal laser is also recommended for residual macular edema (swelling). Excellent control of blood glucose and blood pressure are encouraged under the care of a primary physician or endocrinologist. Similarly, attempts to maintain serum cholesterol, low density lipoproteins, and high-density lipoproteins in a favorable range were recommended.   Nuclear sclerotic cataract of right eye The nature of cataract was discussed with the patient as well as the elective nature of surgery. The patient was reassured that surgery at a later date does not put the patient at risk for a worse outcome. It was emphasized that the need for surgery is dictated by the patient's quality of life as influenced by the cataract. Patient was instructed to maintain close  follow up with their general eye care doctor.      ICD-10-CM   1. 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 Nuclear sclerotic cataract of right eye  H25.11     1.  2.  3.  Ophthalmic Meds Ordered this visit:  Meds ordered this encounter  Medications  . aflibercept (EYLEA) SOLN 2 mg       Return in about 5 weeks (around 07/02/2019) for EYLEA OCT.  There are no Patient Instructions on file for this visit.   Explained the diagnoses, plan, and follow up with the patient and they expressed understanding.  Patient expressed understanding of the importance of proper follow up care.   Clent Demark Raeleigh Guinn M.D. Diseases & Surgery of the Retina and Vitreous Retina & Diabetic [place] 05/28/19     Abbreviations: M myopia (nearsighted); A astigmatism; H hyperopia (farsighted); P presbyopia; Mrx spectacle prescription;  CTL contact lenses; OD right eye; OS left eye; OU both eyes  XT exotropia; ET esotropia; PEK punctate epithelial keratitis; PEE punctate epithelial erosions; DES dry eye syndrome; MGD meibomian gland dysfunction; ATs artificial tears; PFAT's preservative free artificial tears; [place] nuclear sclerotic cataract; PSC posterior subcapsular cataract; ERM epi-retinal membrane; PVD posterior vitreous detachment; RD retinal detachment; DM diabetes mellitus; DR diabetic retinopathy; NPDR non-proliferative diabetic retinopathy; PDR proliferative diabetic retinopathy; CSME clinically significant macular edema; DME diabetic macular edema; dbh dot blot hemorrhages; CWS cotton wool spot; POAG primary open angle glaucoma; C/D cup-to-disc ratio; HVF humphrey  visual field; GVF goldmann visual field; OCT optical coherence tomography; IOP intraocular pressure; BRVO Branch retinal vein occlusion; CRVO central retinal vein occlusion; CRAO central retinal artery occlusion; BRAO branch retinal artery occlusion; RT retinal tear; SB scleral buckle; PPV pars plana vitrectomy; VH Vitreous hemorrhage; PRP panretinal laser photocoagulation; IVK intravitreal kenalog; VMT vitreomacular traction; MH Macular hole;  NVD neovascularization of the disc; NVE neovascularization elsewhere; AREDS age related eye disease study; ARMD age related macular degeneration; POAG primary open angle glaucoma; EBMD epithelial/anterior basement membrane dystrophy; ACIOL anterior chamber intraocular lens; IOL intraocular lens; PCIOL posterior chamber intraocular lens; Phaco/IOL phacoemulsification with intraocular lens placement; [place] photorefractive keratectomy; LASIK laser assisted in situ keratomileusis; HTN hypertension; DM diabetes mellitus; COPD chronic obstructive pulmonary disease

## 2019-05-28 NOTE — Assessment & Plan Note (Signed)

## 2019-05-28 NOTE — Assessment & Plan Note (Signed)
The nature of diabetic macular edema was discussed with the patient. Treatment options were outlined including medical therapy, laser & vitrectomy. The use of injectable medications reviewed, including Avastin, Lucentis, and Eylea. Periodic injections into the eye are likely to resolve diabetic macular edema (swelling in the center of vision). Initially, injections are delivered are delivered every 4-6 weeks, and the interval extended as the condition improves. On average, 8-9 injections the first year, and 5 in year 2. Improvement in the condition most often improves on medical therapy. Occasional use of focal laser is also recommended for residual macular edema (swelling). Excellent control of blood glucose and blood pressure are encouraged under the care of a primary physician or endocrinologist. Similarly, attempts to maintain serum cholesterol, low density lipoproteins, and high-density lipoproteins in a favorable range were recommended.

## 2019-05-29 ENCOUNTER — Telehealth: Payer: Self-pay

## 2019-05-29 NOTE — Telephone Encounter (Signed)
Thank you Miyako Oelke! 

## 2019-05-29 NOTE — Telephone Encounter (Signed)
Pt's wife called into our sleep study center, call was transferred into triage. She states her husband has had several falls in the last couple of days, most recently this morning. She is unable to provide a BP or HR at this time. She states he was bleeding from his knee and confirmed he hit his head. He is not sure how or why he has been falling. At the time I spoke with him and her, they were both anxious and were not able to clearly describe the nature of the falls.  I advised her to call EMS so they may assess him on site and provide transportation to the hospital if needed.   I will forward to pts PCP in case non-cardiac follow up is needed.   Pts wife has verbalized understanding and agreed with plan.

## 2019-06-01 ENCOUNTER — Encounter (HOSPITAL_COMMUNITY)
Admission: RE | Admit: 2019-06-01 | Discharge: 2019-06-01 | Disposition: A | Discharge status: Home / Self Care | Payer: Medicare Other | Source: Ambulatory Visit | Attending: Nephrology | Admitting: Nephrology

## 2019-06-01 ENCOUNTER — Other Ambulatory Visit: Payer: Self-pay

## 2019-06-01 ENCOUNTER — Encounter (HOSPITAL_COMMUNITY): Payer: Self-pay

## 2019-06-01 DIAGNOSIS — D631 Anemia in chronic kidney disease: Secondary | ICD-10-CM | POA: Diagnosis not present

## 2019-06-01 DIAGNOSIS — N184 Chronic kidney disease, stage 4 (severe): Secondary | ICD-10-CM | POA: Diagnosis not present

## 2019-06-01 LAB — POCT HEMOGLOBIN-HEMACUE: Hemoglobin: 9.6 g/dL — ABNORMAL LOW (ref 13.0–17.0)

## 2019-06-01 MED ORDER — SODIUM CHLORIDE 0.9 % IV SOLN: Freq: Once | INTRAVENOUS | Status: AC

## 2019-06-01 MED ORDER — EPOETIN ALFA-EPBX 10000 UNIT/ML IJ SOLN
20000.0000 [IU] | Freq: Once | INTRAMUSCULAR | Status: AC
  Administered 2019-06-01: 20000 [IU] via SUBCUTANEOUS
  Filled 2019-06-01: qty 2

## 2019-06-01 MED ORDER — SODIUM CHLORIDE 0.9 % IV SOLN
510.0000 mg | Freq: Once | INTRAVENOUS | Status: AC
  Administered 2019-06-01: 510 mg via INTRAVENOUS
  Filled 2019-06-01: qty 510

## 2019-06-15 ENCOUNTER — Encounter (HOSPITAL_COMMUNITY)
Admission: RE | Admit: 2019-06-15 | Discharge: 2019-06-15 | Disposition: A | Discharge status: Home / Self Care | Payer: Medicare Other | Source: Ambulatory Visit | Attending: Nephrology | Admitting: Nephrology

## 2019-06-15 ENCOUNTER — Encounter (HOSPITAL_COMMUNITY): Payer: Self-pay

## 2019-06-15 ENCOUNTER — Other Ambulatory Visit: Payer: Self-pay

## 2019-06-15 DIAGNOSIS — D631 Anemia in chronic kidney disease: Secondary | ICD-10-CM | POA: Diagnosis not present

## 2019-06-15 DIAGNOSIS — N184 Chronic kidney disease, stage 4 (severe): Secondary | ICD-10-CM | POA: Diagnosis present

## 2019-06-15 LAB — RENAL FUNCTION PANEL
Albumin: 3.5 g/dL (ref 3.5–5.0)
Anion gap: 13 (ref 5–15)
BUN: 146 mg/dL — ABNORMAL HIGH (ref 8–23)
CO2: 22 mmol/L (ref 22–32)
Calcium: 8.8 mg/dL — ABNORMAL LOW (ref 8.9–10.3)
Chloride: 103 mmol/L (ref 98–111)
Creatinine, Ser: 6.99 mg/dL — ABNORMAL HIGH (ref 0.61–1.24)
GFR calc Af Amer: 8 mL/min — ABNORMAL LOW (ref >60)
GFR calc non Af Amer: 7 mL/min — ABNORMAL LOW (ref >60)
Glucose, Bld: 219 mg/dL — ABNORMAL HIGH (ref 70–99)
Phosphorus: 5.1 mg/dL — ABNORMAL HIGH (ref 2.5–4.6)
Potassium: 4.3 mmol/L (ref 3.5–5.1)
Sodium: 138 mmol/L (ref 135–145)

## 2019-06-15 LAB — CBC WITH DIFFERENTIAL/PLATELET
Abs Immature Granulocytes: 0.01 10*3/uL (ref 0.00–0.07)
Basophils Absolute: 0 10*3/uL (ref 0.0–0.1)
Basophils Relative: 1 %
Eosinophils Absolute: 0.2 10*3/uL (ref 0.0–0.5)
Eosinophils Relative: 3 %
HCT: 29.7 % — ABNORMAL LOW (ref 39.0–52.0)
Hemoglobin: 9.5 g/dL — ABNORMAL LOW (ref 13.0–17.0)
Immature Granulocytes: 0 %
Lymphocytes Relative: 11 %
Lymphs Abs: 0.5 10*3/uL — ABNORMAL LOW (ref 0.7–4.0)
MCH: 31.1 pg (ref 26.0–34.0)
MCHC: 32 g/dL (ref 30.0–36.0)
MCV: 97.4 fL (ref 80.0–100.0)
Monocytes Absolute: 0.6 10*3/uL (ref 0.1–1.0)
Monocytes Relative: 12 %
Neutro Abs: 3.4 10*3/uL (ref 1.7–7.7)
Neutrophils Relative %: 73 %
Platelets: 169 10*3/uL (ref 150–400)
RBC: 3.05 MIL/uL — ABNORMAL LOW (ref 4.22–5.81)
RDW: 15.1 % (ref 11.5–15.5)
WBC: 4.7 10*3/uL (ref 4.0–10.5)
nRBC: 0 % (ref 0.0–0.2)

## 2019-06-15 LAB — IRON AND TIBC
Iron: 41 ug/dL — ABNORMAL LOW (ref 45–182)
Saturation Ratios: 15 % — ABNORMAL LOW (ref 17.9–39.5)
TIBC: 277 ug/dL (ref 250–450)
UIBC: 236 ug/dL

## 2019-06-15 LAB — POCT HEMOGLOBIN-HEMACUE: Hemoglobin: 9.4 g/dL — ABNORMAL LOW (ref 13.0–17.0)

## 2019-06-15 LAB — FERRITIN: Ferritin: 432 ng/mL — ABNORMAL HIGH (ref 24–336)

## 2019-06-15 MED ORDER — EPOETIN ALFA-EPBX 10000 UNIT/ML IJ SOLN
INTRAMUSCULAR | Status: AC
  Filled 2019-06-15: qty 2

## 2019-06-15 MED ORDER — EPOETIN ALFA-EPBX 10000 UNIT/ML IJ SOLN
20000.0000 [IU] | Freq: Once | INTRAMUSCULAR | Status: AC
  Administered 2019-06-15: 20000 [IU] via SUBCUTANEOUS

## 2019-06-17 DIAGNOSIS — N2581 Secondary hyperparathyroidism of renal origin: Secondary | ICD-10-CM | POA: Diagnosis not present

## 2019-06-17 DIAGNOSIS — D649 Anemia, unspecified: Secondary | ICD-10-CM | POA: Diagnosis not present

## 2019-06-17 DIAGNOSIS — N185 Chronic kidney disease, stage 5: Secondary | ICD-10-CM | POA: Diagnosis not present

## 2019-06-17 DIAGNOSIS — I120 Hypertensive chronic kidney disease with stage 5 chronic kidney disease or end stage renal disease: Secondary | ICD-10-CM | POA: Diagnosis not present

## 2019-06-23 ENCOUNTER — Telehealth: Payer: Self-pay

## 2019-06-23 ENCOUNTER — Other Ambulatory Visit: Payer: Self-pay

## 2019-06-23 ENCOUNTER — Encounter: Payer: Self-pay | Admitting: Nurse Practitioner

## 2019-06-23 ENCOUNTER — Ambulatory Visit (INDEPENDENT_AMBULATORY_CARE_PROVIDER_SITE_OTHER): Payer: Medicare Other | Admitting: Nurse Practitioner

## 2019-06-23 ENCOUNTER — Encounter (HOSPITAL_COMMUNITY)
Admission: RE | Admit: 2019-06-23 | Discharge: 2019-06-23 | Disposition: A | Discharge status: Home / Self Care | Payer: Medicare Other | Source: Ambulatory Visit | Attending: Nephrology | Admitting: Nephrology

## 2019-06-23 ENCOUNTER — Encounter (HOSPITAL_COMMUNITY): Payer: Self-pay

## 2019-06-23 DIAGNOSIS — N184 Chronic kidney disease, stage 4 (severe): Secondary | ICD-10-CM | POA: Diagnosis not present

## 2019-06-23 DIAGNOSIS — Z0000 Encounter for general adult medical examination without abnormal findings: Secondary | ICD-10-CM

## 2019-06-23 MED ORDER — SODIUM CHLORIDE 0.9 % IV SOLN: Freq: Once | INTRAVENOUS | Status: AC

## 2019-06-23 MED ORDER — SODIUM CHLORIDE 0.9 % IV SOLN
510.0000 mg | Freq: Once | INTRAVENOUS | Status: AC
  Administered 2019-06-23: 13:00:00 510 mg via INTRAVENOUS
  Filled 2019-06-23: qty 510

## 2019-06-23 MED ORDER — ATORVASTATIN CALCIUM 40 MG PO TABS: 40.0000 mg | ORAL_TABLET | Freq: Every evening | ORAL | 1 refills | Status: DC

## 2019-06-23 NOTE — Telephone Encounter (Signed)
   Henry Carroll, Henry Carroll are scheduled for a virtual visit with your provider today.    Just as we do with appointments in the office, we must obtain your consent to participate.  Your consent will be active for this visit and any virtual visit you may have with one of our providers in the next 365 days.    If you have a MyChart account, I can also send a copy of this consent to you electronically.  All virtual visits are billed to your insurance company just like a traditional visit in the office.  As this is a virtual visit, video technology does not allow for your provider to perform a traditional examination.  This may limit your provider's ability to fully assess your condition.  If your provider identifies any concerns that need to be evaluated in person or the need to arrange testing such as labs, EKG, etc, we will make arrangements to do so.    Although advances in technology are sophisticated, we cannot ensure that it will always work on either your end or our end.  If the connection with a video visit is poor, we may have to switch to a telephone visit.  With either a video or telephone visit, we are not always able to ensure that we have a secure connection.   I need to obtain your verbal consent now.   Are you willing to proceed with your visit today?   Henry Carroll has provided verbal consent on 06/23/2019 for a virtual visit (video or telephone).   [name], [facility] 06/23/2019  9:19 AM

## 2019-06-23 NOTE — Progress Notes (Signed)
   This service is provided via telemedicine  No vital signs collected/recorded due to the encounter was a telemedicine visit.   Location of patient (ex: home, work):  Home  Patient consents to a telephone visit:  Yes, see telehealth annual consent dated 06/23/2019  Location of the provider (ex: office, home):  [facility]  Name of any referring provider:  N/A  Names of all persons participating in the telemedicine service and their role in the encounter:  Sherrie Mustache, Nurse Practitioner, [name], CMA, and patient.   Time spent on call:  7 minutes with medical assistant

## 2019-06-23 NOTE — Patient Instructions (Signed)
Henry Carroll , Thank you for taking time to come for your Medicare Wellness Visit. I appreciate your ongoing commitment to your health goals. Please review the following plan we discussed and let me know if I can assist you in the future.   Screening recommendations/referrals: Colonoscopy up to date Recommended yearly ophthalmology/optometry visit for glaucoma screening and checkup Recommended yearly dental visit for hygiene and checkup  Vaccinations: Influenza vaccine DUE September 2021 Pneumococcal vaccine up to date Tdap vaccine up to date Shingles vaccine up to date  Advanced directives: recommend to complete MOST form.   Conditions/risks identified: cardiovascular event, falls   Next appointment: 1 year   Preventive Care 64 Years and Older, Male Preventive care refers to lifestyle choices and visits with your health care provider that can promote health and wellness. What does preventive care include?  A yearly physical exam. This is also called an annual well check.  Dental exams once or twice a year.  Routine eye exams. Ask your health care provider how often you should have your eyes checked.  Personal lifestyle choices, including:  Daily care of your teeth and gums.  Regular physical activity.  Eating a healthy diet.  Avoiding tobacco and drug use.  Limiting alcohol use.  Practicing safe sex.  Taking low doses of aspirin every day.  Taking vitamin and mineral supplements as recommended by your health care provider. What happens during an annual well check? The services and screenings done by your health care provider during your annual well check will depend on your age, overall health, lifestyle risk factors, and family history of disease. Counseling  Your health care provider may ask you questions about your:  Alcohol use.  Tobacco use.  Drug use.  Emotional well-being.  Home and relationship well-being.  Sexual activity.  Eating  habits.  History of falls.  Memory and ability to understand (cognition).  Work and work Statistician. Screening  You may have the following tests or measurements:  Height, weight, and BMI.  Blood pressure.  Lipid and cholesterol levels. These may be checked every 5 years, or more frequently if you are over 4 years old.  Skin check.  Lung cancer screening. You may have this screening every year starting at age 23 if you have a 30-pack-year history of smoking and currently smoke or have quit within the past 15 years.  Fecal occult blood test (FOBT) of the stool. You may have this test every year starting at age 67.  Flexible sigmoidoscopy or colonoscopy. You may have a sigmoidoscopy every 5 years or a colonoscopy every 10 years starting at age 47.  Prostate cancer screening. Recommendations will vary depending on your family history and other risks.  Hepatitis C blood test.  Hepatitis B blood test.  Sexually transmitted disease (STD) testing.  Diabetes screening. This is done by checking your blood sugar (glucose) after you have not eaten for a while (fasting). You may have this done every 1-3 years.  Abdominal aortic aneurysm (AAA) screening. You may need this if you are a current or former smoker.  Osteoporosis. You may be screened starting at age 78 if you are at high risk. Talk with your health care provider about your test results, treatment options, and if necessary, the need for more tests. Vaccines  Your health care provider may recommend certain vaccines, such as:  Influenza vaccine. This is recommended every year.  Tetanus, diphtheria, and acellular pertussis (Tdap, Td) vaccine. You may need a Td booster every 10 years.  Zoster vaccine. You may need this after age 61.  Pneumococcal 13-valent conjugate (PCV13) vaccine. One dose is recommended after age 41.  Pneumococcal polysaccharide (PPSV23) vaccine. One dose is recommended after age 71. Talk to your health  care provider about which screenings and vaccines you need and how often you need them. This information is not intended to replace advice given to you by your health care provider. Make sure you discuss any questions you have with your health care provider. Document Released: 02/25/2015 Document Revised: 10/19/2015 Document Reviewed: 11/30/2014 Elsevier Interactive Patient Education  2017 [place] Prevention in the Home Falls can cause injuries. They can happen to people of all ages. There are many things you can do to make your home safe and to help prevent falls. What can I do on the outside of my home?  Regularly fix the edges of walkways and driveways and fix any cracks.  Remove anything that might make you trip as you walk through a door, such as a raised step or threshold.  Trim any bushes or trees on the path to your home.  Use bright outdoor lighting.  Clear any walking paths of anything that might make someone trip, such as rocks or tools.  Regularly check to see if handrails are loose or broken. Make sure that both sides of any steps have handrails.  Any raised decks and porches should have guardrails on the edges.  Have any leaves, snow, or ice cleared regularly.  Use sand or salt on walking paths during winter.  Clean up any spills in your garage right away. This includes oil or grease spills. What can I do in the bathroom?  Use night lights.  Install grab bars by the toilet and in the tub and shower. Do not use towel bars as grab bars.  Use non-skid mats or decals in the tub or shower.  If you need to sit down in the shower, use a plastic, non-slip stool.  Keep the floor dry. Clean up any water that spills on the floor as soon as it happens.  Remove soap buildup in the tub or shower regularly.  Attach bath mats securely with double-sided non-slip rug tape.  Do not have throw rugs and other things on the floor that can make you trip. What can I do  in the bedroom?  Use night lights.  Make sure that you have a light by your bed that is easy to reach.  Do not use any sheets or blankets that are too big for your bed. They should not hang down onto the floor.  Have a firm chair that has side arms. You can use this for support while you get dressed.  Do not have throw rugs and other things on the floor that can make you trip. What can I do in the kitchen?  Clean up any spills right away.  Avoid walking on wet floors.  Keep items that you use a lot in easy-to-reach places.  If you need to reach something above you, use a strong step stool that has a grab bar.  Keep electrical cords out of the way.  Do not use floor polish or wax that makes floors slippery. If you must use wax, use non-skid floor wax.  Do not have throw rugs and other things on the floor that can make you trip. What can I do with my stairs?  Do not leave any items on the stairs.  Make sure that there are  handrails on both sides of the stairs and use them. Fix handrails that are broken or loose. Make sure that handrails are as long as the stairways.  Check any carpeting to make sure that it is firmly attached to the stairs. Fix any carpet that is loose or worn.  Avoid having throw rugs at the top or bottom of the stairs. If you do have throw rugs, attach them to the floor with carpet tape.  Make sure that you have a light switch at the top of the stairs and the bottom of the stairs. If you do not have them, ask someone to add them for you. What else can I do to help prevent falls?  Wear shoes that:  Do not have high heels.  Have rubber bottoms.  Are comfortable and fit you well.  Are closed at the toe. Do not wear sandals.  If you use a stepladder:  Make sure that it is fully opened. Do not climb a closed stepladder.  Make sure that both sides of the stepladder are locked into place.  Ask someone to hold it for you, if possible.  Clearly mark  and make sure that you can see:  Any grab bars or handrails.  First and last steps.  Where the edge of each step is.  Use tools that help you move around (mobility aids) if they are needed. These include:  Canes.  Walkers.  Scooters.  Crutches.  Turn on the lights when you go into a dark area. Replace any light bulbs as soon as they burn out.  Set up your furniture so you have a clear path. Avoid moving your furniture around.  If any of your floors are uneven, fix them.  If there are any pets around you, be aware of where they are.  Review your medicines with your doctor. Some medicines can make you feel dizzy. This can increase your chance of falling. Ask your doctor what other things that you can do to help prevent falls. This information is not intended to replace advice given to you by your health care provider. Make sure you discuss any questions you have with your health care provider. Document Released: 11/25/2008 Document Revised: 07/07/2015 Document Reviewed: 03/05/2014 Elsevier Interactive Patient Education  2017 [employer].

## 2019-06-23 NOTE — Progress Notes (Signed)
Subjective:   Henry Carroll is a 68 y.o. male who presents for Medicare Annual/Subsequent preventive examination.  Review of Systems:   Cardiac Risk Factors include: hypertension;diabetes mellitus;male gender;dyslipidemia;advanced age (>41mn, >>39women);family history of premature cardiovascular disease;sedentary lifestyle     Objective:    Vitals: There were no vitals taken for this visit.  There is no height or weight on file to calculate BMI.  Advanced Directives 06/23/2019 05/25/2019 05/20/2019 02/20/2019 02/17/2019 01/07/2019 11/25/2018  Does Patient Have a Medical Advance Directive? No No No Yes No No No  Type of Advance Directive - - -Public librarianLiving will - - -  Does patient want to make changes to medical advance directive? - No - Patient declined - No - Patient declined - Yes (MAU/Ambulatory/Procedural Areas - Information given) -  Copy of HCotullain Chart? - - - No - copy requested - - -  Would patient like information on creating a medical advance directive? No - Patient declined No - Patient declined - No - Patient declined No - Patient declined - No - Patient declined    Tobacco Social History   Tobacco Use  Smoking Status Former Smoker  . Years: 17.00  . Types: Cigars  . Quit date: 12/31/2014  . Years since quitting: 4.4  Smokeless Tobacco Never Used     Counseling given: Not Answered   Clinical Intake:  Pre-visit preparation completed: Yes  Pain : No/denies pain     BMI - recorded: 27 Nutritional Status: BMI 25 -29 Overweight Nutritional Risks: None  How often do you need to have someone help you when you read instructions, pamphlets, or other written materials from your doctor or pharmacy?: 1 - Never        Past Medical History:  Diagnosis Date  . Abnormal stress test 02/10/2015  . Acute on chronic systolic and diastolic heart failure, NYHA class 1 ([place] 12/31/2014  . Anemia   . Anemia in chronic  kidney disease 09/03/2012  . 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 Asthma   . Asthma, chronic 06/04/2012  . Asthma, chronic, mild intermittent, with acute exacerbation 12/22/2015  . Bilateral lower extremity edema 12/22/2015  . CAD (coronary artery disease) 02/18/2015  . Chronic combined systolic (congestive) and diastolic (congestive) heart failure ([facility]    a. 12/31/14: 2D ECHO: EF 40-45%, HK of inf myocardium, G1DD, mod MR  . Chronic combined systolic and diastolic CHF (congestive heart failure) ([place] 02/10/2015  . Chronic systolic heart failure ([place] 02/25/2015  . CKD (chronic kidney disease), stage III    stage 3 kidney disease  . CKD (chronic kidney disease), stage V ([facility] 08/30/2018  . COLONIC POLYPS, HX OF 04/13/2009   Qualifier: Diagnosis of  By: LWestly Pam   . Coronary artery disease    a. LHC 01/2015 - triple vessel CAD (mod oLM, mLAD, severe mRCA, intermediate branch stenosis, CTO of mCx). Plan CABG 02/2015.  .Marland KitchenDemand ischemia ([facility] 12/31/2014  . DM type 2, uncontrolled, with renal complications ([facility] 36/75/4492 . Dyspnea     due to fluid  . Elevated troponin   . Essential hypertension 10/09/2013  . Folliculitis of perineum 10/01/2012  . Gastric erosion   . GERD (gastroesophageal reflux disease)   . History of blood transfusion   . Hyperlipidemia   . Hyperlipidemia with target LDL less than 100 12/31/2014  . Hypertension   . Hypertensive heart disease with heart failure ([facility]  02/25/2015  . IDA (iron deficiency anemia) 01/23/2016  . Melena 01/23/2016  . 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 Mitral regurgitation    a. Mild-mod by echo 12/2014.  . Morbid obesity ([place]) 12/29/2014  . Myocardial infarction [facility]    pt. states per [name] he has in the past  . NSVT (nonsustained ventricular tachycardia) ([place])    a. 9 beats during 01/2015 adm. BB titrated.  . Obesity    a. BMI 33  . Obesity (BMI 30-39.9) 05/05/2013  . OSA (obstructive sleep  apnea) 01/16/2018   Mild obstructive sleep apnea overall with an AHI of 7.3/h and no significant central sleep apnea. Severe obstructive sleep apnea during REM sleep with an AHI of 34.3/h.  Now on CPAP at 6 cm H2O.   . Other malaise and fatigue 09/03/2012  . 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 PAD (peripheral artery disease) ([place]) 03/02/2015  . Pneumonia   . Sleep apnea    2019, Dr.Turner diagnoised   . Small bowel lesion   . Symptomatic anemia 08/29/2018  . 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 Type II diabetes mellitus ([place])   . Wears dentures    Past Surgical History:  Procedure Laterality Date  . BACK SURGERY    . BASCILIC VEIN TRANSPOSITION Left 11/14/2018   Procedure: FIRST STAGE BASILIC VEIN TRANSPOSITION LEFT ARM;  Surgeon: Serafina Mitchell, MD;  Location: [place];  Service: Vascular;  Laterality: Left;  . BASCILIC VEIN TRANSPOSITION Left 01/28/2019   Procedure: BASILIC VEIN TRANSPOSITION SECOND STAGE;  Surgeon: Serafina Mitchell, MD;  Location: [place];  Service: Vascular;  Laterality: Left;  . BONE MARROW BIOPSY    . CARDIAC CATHETERIZATION N/A 02/09/2015   Procedure: Left Heart Cath and Coronary Angiography;  Surgeon: Burnell Blanks, MD;  Location: [place] CV LAB;  Service: Cardiovascular;  Laterality: N/A;  . COLONOSCOPY  2011   Dr. [name]: Ileocecal valve appeared normal, scattered pancolonic diverticulosis, difficult bowel prep making smaller lesions potentially missed. Recommended three-year follow-up colonoscopy.  . COLONOSCOPY  2003   Dr. [name]: Suspicious lesion at the ileocecal valve, multiple biopsies benign, pancolonic diverticulosis.  . COLONOSCOPY N/A 02/15/2016   diverticulosis in sigmoid and descending colon, single 5 mm polyp at splenic flexure (Tubular adenoma)  . CORONARY ARTERY BYPASS GRAFT N/A 02/18/2015   Procedure: CORONARY ARTERY BYPASS GRAFTING (CABG) x  four, using bilateral internal mammary arteries and right leg greater saphenous vein  harvested endoscopically;  Surgeon: [name], MD;  Location: [place] OR;  Service: Open Heart Surgery;  Laterality: N/A;  . ESOPHAGOGASTRODUODENOSCOPY N/A 02/15/2016   normal  . EYE SURGERY Right    July 2019   . GIVENS CAPSULE STUDY N/A 01/08/2017   couple of gastric and small bowel eroions in setting of aspirin 81 mg daily but nothing concerning, continue Hematology follow-up  . HERNIA REPAIR  1505   Umbilical  . LUMBAR [place] SURGERY  2006   L4 & L5  . REFRACTIVE SURGERY Left    2019   . REFRACTIVE SURGERY Right    2019  . TEE WITHOUT CARDIOVERSION N/A 02/18/2015   Procedure: TRANSESOPHAGEAL ECHOCARDIOGRAM (TEE);  Surgeon: [name], MD;  Location: [place];  Service: Open Heart Surgery;  Laterality: N/A;  . TONSILLECTOMY  1962   Family History  Problem Relation Age of Onset  . Diabetes Mother   . Heart disease Mother        later in life, age >8  .  Heart disease Father        heart failure later in life  . Hypertension Father   . Stroke Sister   . Kidney disease Daughter   . Sudden death Daughter   . Colon cancer Paternal Uncle        Passed from colon CA  . Heart attack Neg Hx    Social History   Socioeconomic History  . Marital status: Married    Spouse name: Not on file  . Number of children: Not on file  . Years of education: Not on file  . Highest education level: Not on file  Occupational History  . Not on file  Tobacco Use  . Smoking status: Former Smoker    Years: 17.00    Types: Cigars    Quit date: 12/31/2014    Years since quitting: 4.4  . Smokeless tobacco: Never Used  Substance and Sexual Activity  . Alcohol use: Yes    Alcohol/week: 3.0 standard drinks    Types: 3 Glasses of wine per week    Comment: occasional glass of wine.  . Drug use: No  . Sexual activity: Yes    Birth control/protection: None  Other Topics Concern  . Not on file  Social History Narrative  . Not on file   Social Determinants of Health   Financial Resource Strain:    . Difficulty of Paying Living Expenses:   Food Insecurity:   . Worried About Charity fundraiser in the Last Year:   . Arboriculturist in the Last Year:   Transportation Needs:   . Film/video editor (Medical):   Marland Kitchen Lack of Transportation (Non-Medical):   Physical Activity:   . Days of Exercise per Week:   . Minutes of Exercise per Session:   Stress:   . Feeling of Stress :   Social Connections:   . Frequency of Communication with Friends and Family:   . Frequency of Social Gatherings with Friends and Family:   . Attends Religious Services:   . Active Member of Clubs or Organizations:   . Attends Archivist Meetings:   Marland Kitchen Marital Status:     Outpatient Encounter Medications as of 06/23/2019  Medication Sig  . acetaminophen (TYLENOL) 500 MG tablet Take 1,000 mg by mouth 2 (two) times daily as needed for moderate pain.  Marland Kitchen albuterol (PROVENTIL) (2.5 MG/3ML) 0.083% nebulizer solution USE ONE VIAL IN NEBULIZER EVERY 6 HOURS AS NEEDED FOR WHEEZING FOR SHORTNESS OF BREATH  . albuterol (VENTOLIN HFA) 108 (90 Base) MCG/ACT inhaler Inhale 2 puffs into the lungs every 6 (six) hours as needed for wheezing or shortness of breath.  Marland Kitchen amLODipine (NORVASC) 10 MG tablet Take 1 tablet by mouth once daily  . aspirin EC 81 MG tablet Take 1 tablet (81 mg total) by mouth daily.  Marland Kitchen atorvastatin (LIPITOR) 40 MG tablet TAKE 1 TABLET BY MOUTH ONCE DAILY IN THE EVENING  . carvedilol (COREG) 25 MG tablet TAKE 1 TABLET BY MOUTH TWICE DAILY WITH A MEAL  . Cholecalciferol (VITAMIN D3) 50 MCG (2000 UT) TABS Take 4,000 Units by mouth daily.  . Cinnamon 500 MG capsule Take 1,000 mg by mouth daily.   . Coenzyme Q10 (CO Q 10 PO) Take 200 mg by mouth daily.   Marland Kitchen epoetin alfa-epbx (RETACRIT) 02774 UNIT/ML injection 20,000 Units every 14 (fourteen) days.   . fluticasone (FLONASE) 50 MCG/ACT nasal spray Place 2 sprays into both nostrils daily as needed for allergies or  rhinitis (SEASONAL ALLERGIES).  .  Fluticasone-Salmeterol (ADVAIR) 500-50 MCG/DOSE AEPB Inhale 1 puff into the lungs 2 (two) times daily as needed (respiratory issues.).  Marland Kitchen furosemide (LASIX) 40 MG tablet Take 2 tablets by mouth twice daily  . hydrALAZINE (APRESOLINE) 50 MG tablet Take 50 mg by mouth 2 (two) times daily.  . insulin detemir (LEVEMIR) 100 UNIT/ML injection Inject 0.15 mLs (15 Units total) into the skin daily. In the morning  . isosorbide mononitrate (IMDUR) 30 MG 24 hr tablet Take 1 tablet by mouth once daily  . montelukast (SINGULAIR) 10 MG tablet Take 10 mg by mouth at bedtime.  . Multiple Vitamin (MULTIVITAMIN WITH MINERALS) TABS tablet Take 1 tablet by mouth daily.  . nitroGLYCERIN (NITROSTAT) 0.4 MG SL tablet Place 1 tablet (0.4 mg total) under the tongue every 5 (five) minutes as needed for chest pain.  Marland Kitchen PROAIR HFA 108 (90 Base) MCG/ACT inhaler INHALE 2 PUFFS INTO LUNGS EVERY 6 HOURS AS NEEDED FOR SHORTNESS OF BREATH  . glucose blood (ONE TOUCH ULTRA TEST) test strip Use to test blood sugar three times daily. Dx: E11.21  . [DISCONTINUED] metolazone (ZAROXOLYN) 5 MG tablet Take 5 mg by mouth 2 (two) times a week. Tuesday and Saturday only   . [DISCONTINUED] predniSONE (STERAPRED UNI-PAK 21 TAB) 10 MG (21) TBPK tablet Use as directed   Facility-Administered Encounter Medications as of 06/23/2019  Medication  . regadenoson (LEXISCAN) injection SOLN 0.4 mg    Activities of Daily Living In your present state of health, do you have any difficulty performing the following activities: 06/23/2019 11/20/2018  Hearing? N N  Vision? N N  Difficulty concentrating or making decisions? N N  Walking or climbing stairs? N N  Dressing or bathing? N N  Doing errands, shopping? N -  Preparing Food and eating ? N -  Using the Toilet? N -  In the past six months, have you accidently leaked urine? N -  Do you have problems with loss of bowel control? N -  Managing your Medications? N -  Managing your Finances? N -    Housekeeping or managing your Housekeeping? N -  Some recent data might be hidden    Patient Care Team: [name], NP as PCP - General (Geriatric Medicine) Jerline Pain, MD as PCP - Cardiology (Cardiology) [name], MD as Consulting Physician (Gastroenterology) Zadie Rhine Clent Demark, MD as Consulting Physician (Ophthalmology) Elmarie Shiley, MD as Consulting Physician (Nephrology) Derek Jack, MD as Consulting Physician (Hematology)   Assessment:   This is a routine wellness examination for [place].  Exercise Activities and Dietary recommendations Current Exercise Habits: Home exercise routine, Type of exercise: walking, Time (Minutes): 20, Frequency (Times/Week): 3, Weekly Exercise (Minutes/Week): 60  Goals    . Increase physical activity     Starting 04/06/16, I will attempt to increase my physical activity by walking more and playing golf more.     . Weight (lb) < 200 lb (90.7 kg)       Fall Risk Fall Risk  06/23/2019 05/20/2019 01/07/2019 09/02/2018 06/20/2018  Falls in the past year? 1 0 0 0 0  Number falls in past yr: 0 0 0 0 0  Injury with Fall? 0 0 0 0 0  Risk for fall due to : - - - - -  Risk for fall due to: Comment - - - - -   Is the patient's home free of loose throw rugs in walkways, pet beds, electrical cords, etc?  yes      Grab bars in the bathroom? yes      Handrails on the stairs?   yes      Adequate lighting?   yes  Timed Get Up and Go Performed: na  Depression Screen PHQ 2/9 Scores 06/23/2019 01/07/2019 06/20/2018 05/29/2018  PHQ - 2 Score 0 0 0 0  PHQ- 9 Score - - - -  Exception Documentation - - - -    Cognitive Function MMSE - Mini Mental State Exam 01/09/2017 04/06/2016 12/21/2015 12/01/2014 05/05/2013  Not completed: - (No Data) - - -  Orientation to time 5 - '5 5 4  '$ Orientation to Place 5 - '5 5 5  '$ Registration 3 - '3 3 3  '$ Attention/ Calculation 5 - '5 5 5  '$ Recall 3 - '1 1 2  '$ Language- name 2 objects 2 - '2 2 2  '$ Language- repeat 1 - '1  1 1  '$ Language- follow 3 step command 3 - '3 3 3  '$ Language- read & follow direction 1 - '1 1 1  '$ Write a sentence 1 - '1 1 1  '$ Copy design 1 - '1 1 1  '$ Total score 30 - '28 28 28     '$ 6CIT Screen 06/23/2019 06/20/2018  What Year? 0 points 0 points  What month? 0 points 0 points  What time? 0 points 0 points  Count back from 20 0 points 0 points  Months in reverse 0 points 0 points  Repeat phrase 0 points 0 points  Total Score 0 0    Immunization History  Administered Date(s) Administered  . Influenza, High Dose Seasonal PF 11/26/2017  . Influenza,inj,Quad PF,6+ Mos 10/15/2012, 01/05/2014, 12/01/2014, 12/05/2015, 12/11/2016  . Influenza-Unspecified 02/04/2012  . Moderna SARS-COVID-2 Vaccination 03/22/2019, 04/22/2019  . Pneumococcal Conjugate-13 02/09/2014  . Pneumococcal Polysaccharide-23 05/05/2013  . Tdap 03/26/2011  . Zoster Recombinat (Shingrix) 01/25/2017, 03/15/2017    Qualifies for Shingles Vaccine? Yes, completed   Screening Tests Health Maintenance  Topic Date Due  . Hepatitis C Screening  06/22/2020 (Originally [birth year])  . INFLUENZA VACCINE  09/13/2019  . HEMOGLOBIN A1C  11/19/2019  . OPHTHALMOLOGY EXAM  05/27/2020  . TETANUS/TDAP  03/25/2021  . COLONOSCOPY  02/14/2026  . COVID-19 Vaccine  Completed  . PNA vac Low Risk Adult  Completed   Cancer Screenings: Lung: Low Dose CT Chest recommended if Age 70-80 years, 30 pack-year currently smoking OR have quit w/in 15years. Patient does not qualify. Colorectal: up to date  Additional Screenings: pt declines.  Hepatitis C Screening:      Plan:     I have personally reviewed and noted the following in the patient's chart:   . Medical and social history . Use of alcohol, tobacco or illicit drugs  . Current medications and supplements . Functional ability and status . Nutritional status . Physical activity . Advanced directives . List of other physicians . Hospitalizations, surgeries, and ER visits in previous 12  months . Vitals . Screenings to include cognitive, depression, and falls . Referrals and appointments  In addition, I have reviewed and discussed with patient certain preventive protocols, quality metrics, and best practice recommendations. A written personalized care plan for preventive services as well as general preventive health recommendations were provided to patient.     [name], NP  06/23/2019

## 2019-06-29 ENCOUNTER — Other Ambulatory Visit: Payer: Self-pay | Admitting: Nurse Practitioner

## 2019-06-29 ENCOUNTER — Encounter (HOSPITAL_COMMUNITY): Payer: Medicare Other

## 2019-06-30 ENCOUNTER — Encounter (HOSPITAL_COMMUNITY)
Admission: RE | Admit: 2019-06-30 | Discharge: 2019-06-30 | Disposition: A | Discharge status: Home / Self Care | Payer: Medicare Other | Source: Ambulatory Visit | Attending: Nephrology | Admitting: Nephrology

## 2019-06-30 ENCOUNTER — Other Ambulatory Visit: Payer: Self-pay

## 2019-06-30 ENCOUNTER — Encounter (HOSPITAL_COMMUNITY): Payer: Self-pay

## 2019-06-30 DIAGNOSIS — N184 Chronic kidney disease, stage 4 (severe): Secondary | ICD-10-CM | POA: Diagnosis not present

## 2019-06-30 LAB — POCT HEMOGLOBIN-HEMACUE: Hemoglobin: 10.1 g/dL — ABNORMAL LOW (ref 13.0–17.0)

## 2019-06-30 MED ORDER — SODIUM CHLORIDE 0.9 % IV SOLN
510.0000 mg | Freq: Once | INTRAVENOUS | Status: AC
  Administered 2019-06-30: 510 mg via INTRAVENOUS
  Filled 2019-06-30: qty 17

## 2019-06-30 MED ORDER — EPOETIN ALFA-EPBX 10000 UNIT/ML IJ SOLN
INTRAMUSCULAR | Status: AC
  Filled 2019-06-30: qty 2

## 2019-06-30 MED ORDER — SODIUM CHLORIDE 0.9 % IV SOLN: Freq: Once | INTRAVENOUS | Status: AC

## 2019-06-30 MED ORDER — EPOETIN ALFA-EPBX 10000 UNIT/ML IJ SOLN
20000.0000 [IU] | Freq: Once | INTRAMUSCULAR | Status: AC
  Administered 2019-06-30: 20000 [IU] via SUBCUTANEOUS

## 2019-07-01 ENCOUNTER — Other Ambulatory Visit: Payer: Self-pay | Admitting: Nurse Practitioner

## 2019-07-02 ENCOUNTER — Ambulatory Visit (INDEPENDENT_AMBULATORY_CARE_PROVIDER_SITE_OTHER): Payer: Medicare Other | Admitting: Ophthalmology

## 2019-07-02 ENCOUNTER — Encounter (INDEPENDENT_AMBULATORY_CARE_PROVIDER_SITE_OTHER): Payer: Self-pay | Admitting: Ophthalmology

## 2019-07-02 ENCOUNTER — Other Ambulatory Visit: Payer: Self-pay

## 2019-07-02 DIAGNOSIS — H2512 Age-related nuclear cataract, left eye: Secondary | ICD-10-CM

## 2019-07-02 DIAGNOSIS — H2511 Age-related nuclear cataract, right eye: Secondary | ICD-10-CM

## 2019-07-02 DIAGNOSIS — E113511 Type 2 diabetes mellitus with proliferative diabetic retinopathy with macular edema, right eye: Secondary | ICD-10-CM | POA: Diagnosis not present

## 2019-07-02 MED ORDER — AFLIBERCEPT 2MG/0.05ML IZ SOLN FOR KALEIDOSCOPE
2.0000 mg | INTRAVITREAL | Status: AC | PRN
  Administered 2019-07-02: 2 mg via INTRAVITREAL

## 2019-07-02 NOTE — Assessment & Plan Note (Signed)
Okay to proceed OU at any time with cataract extraction with intraocular lens placement to maximize visual potential.  Patient may return to Dr. Gershon Crane at any time for consideration of cataract traction with intraocular lens placement during the course of therapy for his macular disease from diabetes mellitus

## 2019-07-02 NOTE — Progress Notes (Signed)
07/02/2019     CHIEF COMPLAINT Patient presents for Retina Follow Up   HISTORY OF PRESENT ILLNESS: Henry Carroll is a 69 y.o. 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  Duration of 5 weeks.  Since onset it is stable.          Comments    5 week follow up - OCT OU, Possible Eylea OD Patient denies change in vision and overall has no complaints. A1C 7.18 May 2019 LBS 137 this AM       Last edited by Gerda Diss on 07/02/2019  9:35 AM. (History)      Referring physician: [name], NP [place],  [place] [address]  HISTORICAL INFORMATION:   Selected notes from the MEDICAL RECORD NUMBER    Lab Results  Component Value Date   HGBA1C 7.5 (H) 05/20/2019     CURRENT MEDICATIONS: No current outpatient medications on file. (Ophthalmic Drugs)   No current facility-administered medications for this visit. (Ophthalmic Drugs)   Current Outpatient Medications (Other)  Medication Sig   acetaminophen (TYLENOL) 500 MG tablet Take 1,000 mg by mouth 2 (two) times daily as needed for moderate pain.   albuterol (PROVENTIL) (2.5 MG/3ML) 0.083% nebulizer solution USE ONE VIAL IN NEBULIZER EVERY 6 HOURS AS NEEDED FOR WHEEZING FOR SHORTNESS OF BREATH   albuterol (VENTOLIN HFA) 108 (90 Base) MCG/ACT inhaler Inhale 2 puffs into the lungs every 6 (six) hours as needed for wheezing or shortness of breath.   amLODipine (NORVASC) 10 MG tablet Take 1 tablet by mouth once daily   aspirin EC 81 MG tablet Take 1 tablet (81 mg total) by mouth daily.   atorvastatin (LIPITOR) 40 MG tablet Take 1 tablet (40 mg total) by mouth every evening.   carvedilol (COREG) 25 MG tablet TAKE 1 TABLET BY MOUTH TWICE DAILY WITH A MEAL   Cholecalciferol (VITAMIN D3) 50 MCG (2000 UT) TABS Take 4,000 Units by mouth daily.   Cinnamon 500 MG capsule Take 1,000 mg by mouth daily.    Coenzyme  Q10 (CO Q 10 PO) Take 200 mg by mouth daily.    epoetin alfa-epbx (RETACRIT) 68032 UNIT/ML injection 20,000 Units every 14 (fourteen) days.    fluticasone (FLONASE) 50 MCG/ACT nasal spray Place 2 sprays into both nostrils daily as needed for allergies or rhinitis (SEASONAL ALLERGIES).   Fluticasone-Salmeterol (ADVAIR) 500-50 MCG/DOSE AEPB Inhale 1 puff into the lungs 2 (two) times daily as needed (respiratory issues.).   furosemide (LASIX) 40 MG tablet Take 2 tablets by mouth twice daily   glucose blood (ONE TOUCH ULTRA TEST) test strip Use to test blood sugar three times daily. Dx: E11.21   hydrALAZINE (APRESOLINE) 50 MG tablet Take 50 mg by mouth 2 (two) times daily.   insulin detemir (LEVEMIR) 100 UNIT/ML injection Inject 0.15 mLs (15 Units total) into the skin daily. In the morning   isosorbide mononitrate (IMDUR) 30 MG 24 hr tablet Take 1 tablet by mouth once daily   montelukast (SINGULAIR) 10 MG tablet TAKE 1 TABLET BY MOUTH AT BEDTIME   Multiple Vitamin (MULTIVITAMIN WITH MINERALS) TABS tablet Take 1 tablet by mouth daily.   nitroGLYCERIN (NITROSTAT) 0.4 MG SL tablet Place 1 tablet (0.4 mg total) under the tongue every 5 (five) minutes as needed for chest pain.   PROAIR HFA 108 (90 Base) MCG/ACT  inhaler INHALE 2 PUFFS INTO LUNGS EVERY 6 HOURS AS NEEDED FOR SHORTNESS OF BREATH   No current facility-administered medications for this visit. (Other)   Facility-Administered Medications Ordered in Other Visits (Other)  Medication Route   regadenoson (LEXISCAN) injection SOLN 0.4 mg Intravenous      REVIEW OF SYSTEMS:    ALLERGIES No Known Allergies  PAST MEDICAL HISTORY Past Medical History:  Diagnosis Date   Abnormal stress test 02/10/2015   Acute on chronic systolic and diastolic heart failure, NYHA class 1 ([place]) 12/31/2014   Anemia    Anemia in chronic kidney disease 09/03/2012   Arthritis    left  5th finger   Asthma    Asthma, chronic 06/04/2012    Asthma, chronic, mild intermittent, with acute exacerbation 12/22/2015   Bilateral lower extremity edema 12/22/2015   CAD (coronary artery disease) 02/18/2015   Chronic combined systolic (congestive) and diastolic (congestive) heart failure ([place])    a. 12/31/14: 2D ECHO: EF 40-45%, HK of inf myocardium, G1DD, mod MR   Chronic combined systolic and diastolic CHF (congestive heart failure) ([place]) 18/84/1660   Chronic systolic heart failure ([place]) 02/25/2015   CKD (chronic kidney disease), stage III    stage 3 kidney disease   CKD (chronic kidney disease), stage V ([place]) 08/30/2018   COLONIC POLYPS, HX OF 04/13/2009   Qualifier: Diagnosis of  By: Westly Pam.    Coronary artery disease    a. LHC 01/2015 - triple vessel CAD (mod oLM, mLAD, severe mRCA, intermediate branch stenosis, CTO of mCx). Plan CABG 02/2015.   Demand ischemia ([place]) 12/31/2014   DM type 2, uncontrolled, with renal complications ([place]) 08/12/1599   Dyspnea     due to fluid   Elevated troponin    Essential hypertension 0/93/2355   Folliculitis of perineum 10/01/2012   Gastric erosion    GERD (gastroesophageal reflux disease)    History of blood transfusion    Hyperlipidemia    Hyperlipidemia with target LDL less than 100 12/31/2014   Hypertension    Hypertensive heart disease with heart failure (HCC) 02/25/2015   IDA (iron deficiency anemia) 01/23/2016   Melena 01/23/2016   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   Mitral regurgitation    a. Mild-mod by echo 12/2014.   Morbid obesity ([place]) 12/29/2014   Myocardial infarction [facility])    pt. states per [name] he has in the past   NSVT (nonsustained ventricular tachycardia) ([place])    a. 9 beats during 01/2015 adm. BB titrated.   Obesity    a. BMI 33   Obesity (BMI 30-39.9) 05/05/2013   OSA (obstructive sleep apnea) 01/16/2018   Mild obstructive sleep apnea overall with an AHI of 7.3/h and no significant central sleep  apnea. Severe obstructive sleep apnea during REM sleep with an AHI of 34.3/h.  Now on CPAP at 6 cm H2O.    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   Type II diabetes mellitus ([place])    Wears dentures    Past Surgical History:  Procedure Laterality Date   BACK SURGERY     BASCILIC VEIN TRANSPOSITION Left 11/14/2018   Procedure: FIRST STAGE BASILIC VEIN TRANSPOSITION LEFT ARM;  Surgeon: Serafina Mitchell, MD;  Location: [facility] OR;  Service: Vascular;  Laterality: Left;   BASCILIC VEIN TRANSPOSITION Left 01/28/2019   Procedure: BASILIC VEIN TRANSPOSITION SECOND STAGE;  Surgeon: Serafina Mitchell, MD;  Location: [facility] OR;  Service: Vascular;  Laterality: Left;   BONE MARROW BIOPSY     CARDIAC CATHETERIZATION N/A 02/09/2015   Procedure: Left Heart Cath and Coronary Angiography;  Surgeon: Burnell Blanks, MD;  Location: [facility];  Service: Cardiovascular;  Laterality: N/A;   COLONOSCOPY  2011   Dr. [name]: Ileocecal valve appeared normal, scattered pancolonic diverticulosis, difficult bowel prep making smaller lesions potentially missed. Recommended three-year follow-up colonoscopy.   COLONOSCOPY  2003   Dr. [name]: Suspicious lesion at the ileocecal valve, multiple biopsies benign, pancolonic diverticulosis.   COLONOSCOPY N/A 02/15/2016   diverticulosis in sigmoid and descending colon, single 5 mm polyp at splenic flexure (Tubular adenoma)   CORONARY ARTERY BYPASS GRAFT N/A 02/18/2015   Procedure: CORONARY ARTERY BYPASS GRAFTING (CABG) x  four, using bilateral internal mammary arteries and right leg greater saphenous vein harvested endoscopically;  Surgeon: [name], MD;  Location: [facility];  Service: Open Heart Surgery;  Laterality:  N/A;   ESOPHAGOGASTRODUODENOSCOPY N/A 02/15/2016   normal   EYE SURGERY Right    July 2019    GIVENS CAPSULE STUDY N/A 01/08/2017   couple of gastric and small bowel eroions in setting of aspirin 81 mg daily but nothing concerning, continue Hematology follow-up   HERNIA REPAIR  0865   Umbilical   LUMBAR [place]  2006   L4 & L5   REFRACTIVE SURGERY Left    2019    REFRACTIVE SURGERY Right    2019   TEE WITHOUT CARDIOVERSION N/A 02/18/2015   Procedure: TRANSESOPHAGEAL ECHOCARDIOGRAM (TEE);  Surgeon: [name], MD;  Location: [facility];  Service: Open Heart Surgery;  Laterality: N/A;   TONSILLECTOMY  1962    FAMILY HISTORY Family History  Problem Relation Age of Onset   Diabetes Mother    Heart disease Mother        later in life, age >68   Heart disease Father        heart failure later in life   Hypertension Father    Stroke Sister    Kidney disease Daughter    Sudden death Daughter    Colon cancer Paternal Uncle        Passed from colon CA   Heart attack Neg Hx     SOCIAL HISTORY Social History   Tobacco Use   Smoking status: Former Smoker    Years: 17.00    Types: Cigars    Quit date: 12/31/2014    Years since quitting: 4.5   Smokeless tobacco: Never Used  Substance Use Topics   Alcohol use: Yes    Alcohol/week: 3.0 standard drinks    Types: 3 Glasses of wine per week    Comment: occasional glass of wine.   Drug use: No         OPHTHALMIC EXAM:  Base Eye Exam    Visual Acuity (Snellen - Linear)      Right Left   Dist [place] 20/25-2 20/40-2   Dist ph [place]  NI       Tonometry (Tonopen, 9:39 AM)      Right Left   Pressure 12 14       Pupils      Pupils Dark Light Shape React APD   Right PERRL 3 3 Round Minimal +1  Left PERRL 3 3 Round Minimal None       Visual Fields (Counting fingers)      Left Right    Full Full       Extraocular Movement      Right Left    Full Full       Neuro/Psych    Oriented x3: Yes    Mood/Affect: Normal       Dilation    Right eye: 1.0% Mydriacyl, 2.5% Phenylephrine @ 9:39 AM        Slit Lamp and Fundus Exam    External Exam      Right Left   External Normal Normal       Slit Lamp Exam      Right Left   Lids/Lashes Normal Normal   Conjunctiva/Sclera White and quiet White and quiet   Cornea Clear Clear   Anterior Chamber Deep and quiet Deep and quiet   Iris Round and reactive Round and reactive   Lens 3+ Nuclear sclerosis 3+ Nuclear sclerosis   Anterior Vitreous Normal Normal       Fundus Exam      Right Left   Posterior Vitreous Normal    Disc Normal    C/D Ratio 0.25    Macula Macular thickening,  Mild clinically significant macular edema, Exudates, Microaneurysms    Vessels Quiet proliferative diabetic retinopathy    Periphery Laser scars, good PRP           IMAGING AND PROCEDURES  Imaging and Procedures for 07/02/19  OCT, Retina - OU - Both Eyes       Right Eye Quality was good. Scan locations included subfoveal. Central Foveal Thickness: 319. Progression has improved. Findings include abnormal foveal contour.   Left Eye Quality was good. Scan locations included subfoveal. Central Foveal Thickness: 261. Progression has improved. Findings include abnormal foveal contour.   Notes OD, clinically significant macular edema, improved with less thickening temporally.  We will repeat intravitreal Eylea OD today and may need focal laser treatment to right eye for residual CSME.  OS CSME nearly quiesced sent after recent examination and delivery of therapy.       Intravitreal Injection, Pharmacologic Agent - OD - Right Eye       Time Out 07/02/2019. 10:34 AM. Confirmed correct patient, procedure, site, and patient consented.   Anesthesia Topical anesthesia was used. Anesthetic medications included [place] 3.5%.   Procedure Preparation included Ofloxacin , 10% betadine to eyelids, 5% betadine to ocular surface. A 30 gauge needle was used.    Injection:  2 mg aflibercept Alfonse Flavors) SOLN   NDC: [id number], Lot: [id number]   Route: Intravitreal, Site: Right Eye, Waste: 0 mg  Post-op Post injection exam found visual acuity of at least counting fingers. The patient tolerated the procedure well. There were no complications. The patient received written and verbal post procedure care education. Post injection medications were not given.                 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 Treatment options were outlined including medical therapy, laser & vitrectomy. The use of injectable medications reviewed, including Avastin, Lucentis, and Eylea. Periodic injections into the eye are likely to resolve diabetic macular edema (swelling in the center of vision). Initially, injections are delivered are delivered every 4-6 weeks, and the interval extended as  the condition improves. On average, 8-9 injections the first year, and 5 in year 2. Improvement in the condition most often improves on medical therapy. Occasional use of focal laser is also recommended for residual macular edema (swelling). Excellent control of blood glucose and blood pressure are encouraged under the care of a primary physician or endocrinologist. Similarly, attempts to maintain serum cholesterol, low density lipoproteins, and high-density lipoproteins in a favorable range were recommended.  OD, still active CSME temporal to fovea.  We will repeat intravitreal Eylea today and examination in 5 weeks OD.  Will likely 1 day add local, grid laser focal temporal to fovea  Nuclear sclerotic cataract of right eye Okay to proceed OU at any time with cataract extraction with intraocular lens placement to maximize visual potential.  Patient may return to Dr. Gershon Crane at any time for consideration of cataract  traction with intraocular lens placement during the course of therapy for his macular disease from diabetes mellitus      ICD-10-CM   1. 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 Nuclear sclerotic cataract of left eye  H25.12   3. Nuclear sclerotic cataract of right eye  H25.11     1.  Intravitreal of Eylea delivered OD today for diabetic CSME.  OD will likely 1 day need additional focal laser treatment to the region temporal to the fovea.  2.  OS, will follow up as scheduled within the next week.  3.  Patient is urged to contact Dr. Kellie Moor office for consideration of cataract extraction with intraocular lens placement of each eye given the dense nuclear sclerosis present in each eye.  Ophthalmic Meds Ordered this visit:  Meds ordered this encounter  Medications   aflibercept (EYLEA) SOLN 2 mg       Return in about 5 weeks (around 08/06/2019) for EYLEA OCT, OD.  There are no Patient Instructions on file for this visit.   Explained the diagnoses, plan, and follow up with the patient and they expressed understanding.  Patient expressed understanding of the importance of proper follow up care.   Clent Demark Yue Glasheen M.D. Diseases & Surgery of the Retina and Vitreous Retina & Diabetic [place] 07/02/19     Abbreviations: M myopia (nearsighted); A astigmatism; H hyperopia (farsighted); P presbyopia; Mrx spectacle prescription;  CTL contact lenses; OD right eye; OS left eye; OU both eyes  XT exotropia; ET esotropia; PEK punctate epithelial keratitis; PEE punctate epithelial erosions; DES dry eye syndrome; MGD meibomian gland dysfunction; ATs artificial tears; PFAT's preservative free artificial tears; [place] nuclear sclerotic cataract; PSC posterior subcapsular cataract; ERM  epi-retinal membrane; PVD posterior vitreous detachment; RD retinal detachment; DM diabetes mellitus; DR diabetic retinopathy; NPDR non-proliferative diabetic retinopathy; PDR proliferative diabetic retinopathy; CSME clinically significant macular edema; DME diabetic macular edema; dbh dot blot hemorrhages; CWS cotton wool spot; POAG primary open angle glaucoma; C/D cup-to-disc ratio; HVF humphrey visual field; GVF goldmann visual field; OCT optical coherence tomography; IOP intraocular pressure; BRVO Branch retinal vein occlusion; CRVO central retinal vein occlusion; CRAO central retinal artery occlusion; BRAO branch retinal artery occlusion; RT retinal tear; SB scleral buckle; PPV pars plana vitrectomy; VH Vitreous hemorrhage; PRP panretinal laser photocoagulation; IVK intravitreal kenalog; VMT vitreomacular traction; MH Macular hole;  NVD neovascularization of the disc;  NVE neovascularization elsewhere; AREDS age related eye disease study; ARMD age related macular degeneration; POAG primary open angle glaucoma; EBMD epithelial/anterior basement membrane dystrophy; ACIOL anterior chamber intraocular lens; IOL intraocular lens; PCIOL posterior chamber intraocular lens; Phaco/IOL phacoemulsification with intraocular lens placement; [place] photorefractive keratectomy; LASIK laser assisted in situ keratomileusis; HTN hypertension; DM diabetes mellitus; COPD chronic obstructive pulmonary disease

## 2019-07-02 NOTE — Assessment & Plan Note (Signed)
The nature of diabetic macular edema was discussed with the patient. Treatment options were outlined including medical therapy, laser & vitrectomy. The use of injectable medications reviewed, including Avastin, Lucentis, and Eylea. Periodic injections into the eye are likely to resolve diabetic macular edema (swelling in the center of vision). Initially, injections are delivered are delivered every 4-6 weeks, and the interval extended as the condition improves. On average, 8-9 injections the first year, and 5 in year 2. Improvement in the condition most often improves on medical therapy. Occasional use of focal laser is also recommended for residual macular edema (swelling). Excellent control of blood glucose and blood pressure are encouraged under the care of a primary physician or endocrinologist. Similarly, attempts to maintain serum cholesterol, low density lipoproteins, and high-density lipoproteins in a favorable range were recommended.  OD, still active CSME temporal to fovea.  We will repeat intravitreal Eylea today and examination in 5 weeks OD.  Will likely 1 day add local, grid laser focal temporal to fovea

## 2019-07-06 ENCOUNTER — Other Ambulatory Visit: Payer: Self-pay | Admitting: Cardiology

## 2019-07-07 ENCOUNTER — Other Ambulatory Visit: Payer: Self-pay

## 2019-07-07 ENCOUNTER — Ambulatory Visit (INDEPENDENT_AMBULATORY_CARE_PROVIDER_SITE_OTHER): Payer: Medicare Other | Admitting: Ophthalmology

## 2019-07-07 ENCOUNTER — Encounter (INDEPENDENT_AMBULATORY_CARE_PROVIDER_SITE_OTHER): Payer: Self-pay | Admitting: Ophthalmology

## 2019-07-07 DIAGNOSIS — I2583 Coronary atherosclerosis due to lipid rich plaque: Secondary | ICD-10-CM

## 2019-07-07 DIAGNOSIS — I2510 Atherosclerotic heart disease of native coronary artery without angina pectoris: Secondary | ICD-10-CM | POA: Diagnosis not present

## 2019-07-07 DIAGNOSIS — E113512 Type 2 diabetes mellitus with proliferative diabetic retinopathy with macular edema, left eye: Secondary | ICD-10-CM

## 2019-07-07 MED ORDER — AFLIBERCEPT 2MG/0.05ML IZ SOLN FOR KALEIDOSCOPE
2.0000 mg | INTRAVITREAL | Status: AC | PRN
  Administered 2019-07-07: 2 mg via INTRAVITREAL

## 2019-07-07 NOTE — Progress Notes (Signed)
07/07/2019     CHIEF COMPLAINT Patient presents for Retina Follow Up   HISTORY OF PRESENT ILLNESS: Henry Carroll is a 69 y.o. male who presents to the clinic today for:   HPI    Retina Follow Up    Patient presents with  Diabetic Retinopathy.  In left eye.  This started 6 weeks ago.  Severity is mild.  Duration of 6 weeks.  Since onset it is stable.          Comments    6 Week Diabetic Exam OS, poss Eylea OS  Pt denies noticeable changes to [place] OU since last visit. Pt denies ocular pain, flashes of light, or floaters OU.  LBS: 140 this AM       Last edited by Rockie Neighbours, [place] on 07/07/2019  9:52 AM. (History)      Referring physician: [name], NP [place],  [place] [address]  HISTORICAL INFORMATION:   Selected notes from the MEDICAL RECORD NUMBER    Lab Results  Component Value Date   HGBA1C 7.5 (H) 05/20/2019     CURRENT MEDICATIONS: No current outpatient medications on file. (Ophthalmic Drugs)   No current facility-administered medications for this visit. (Ophthalmic Drugs)   Current Outpatient Medications (Other)  Medication Sig  . acetaminophen (TYLENOL) 500 MG tablet Take 1,000 mg by mouth 2 (two) times daily as needed for moderate pain.  Marland Kitchen albuterol (PROVENTIL) (2.5 MG/3ML) 0.083% nebulizer solution USE ONE VIAL IN NEBULIZER EVERY 6 HOURS AS NEEDED FOR WHEEZING FOR SHORTNESS OF BREATH  . albuterol (VENTOLIN HFA) 108 (90 Base) MCG/ACT inhaler Inhale 2 puffs into the lungs every 6 (six) hours as needed for wheezing or shortness of breath.  Marland Kitchen amLODipine (NORVASC) 10 MG tablet Take 1 tablet by mouth once daily  . aspirin EC 81 MG tablet Take 1 tablet (81 mg total) by mouth daily.  Marland Kitchen atorvastatin (LIPITOR) 40 MG tablet Take 1 tablet (40 mg total) by mouth every evening.  . carvedilol (COREG) 25 MG tablet TAKE 1 TABLET BY MOUTH TWICE DAILY WITH A MEAL  . Cholecalciferol (VITAMIN D3) 50 MCG (2000 UT) TABS Take 4,000 Units by mouth  daily.  . Cinnamon 500 MG capsule Take 1,000 mg by mouth daily.   . Coenzyme Q10 (CO Q 10 PO) Take 200 mg by mouth daily.   Marland Kitchen epoetin alfa-epbx (RETACRIT) 31594 UNIT/ML injection 20,000 Units every 14 (fourteen) days.   . fluticasone (FLONASE) 50 MCG/ACT nasal spray Place 2 sprays into both nostrils daily as needed for allergies or rhinitis (SEASONAL ALLERGIES).  . Fluticasone-Salmeterol (ADVAIR) 500-50 MCG/DOSE AEPB Inhale 1 puff into the lungs 2 (two) times daily as needed (respiratory issues.).  Marland Kitchen furosemide (LASIX) 40 MG tablet Take 2 tablets by mouth twice daily  . glucose blood (ONE TOUCH ULTRA TEST) test strip Use to test blood sugar three times daily. Dx: E11.21  . hydrALAZINE (APRESOLINE) 50 MG tablet Take 50 mg by mouth 2 (two) times daily.  . insulin detemir (LEVEMIR) 100 UNIT/ML injection Inject 0.15 mLs (15 Units total) into the skin daily. In the morning  . isosorbide mononitrate (IMDUR) 30 MG 24 hr tablet Take 1 tablet by mouth once daily  . montelukast (SINGULAIR) 10 MG tablet TAKE 1 TABLET BY MOUTH AT BEDTIME  . Multiple Vitamin (MULTIVITAMIN WITH MINERALS) TABS tablet Take 1 tablet by mouth daily.  . nitroGLYCERIN (NITROSTAT) 0.4 MG SL tablet Place 1 tablet (0.4 mg total) under the tongue  every 5 (five) minutes as needed for chest pain.  Marland Kitchen PROAIR HFA 108 (90 Base) MCG/ACT inhaler INHALE 2 PUFFS INTO LUNGS EVERY 6 HOURS AS NEEDED FOR SHORTNESS OF BREATH   No current facility-administered medications for this visit. (Other)   Facility-Administered Medications Ordered in Other Visits (Other)  Medication Route  . regadenoson (LEXISCAN) injection SOLN 0.4 mg Intravenous      REVIEW OF SYSTEMS:    ALLERGIES No Known Allergies  PAST MEDICAL HISTORY Past Medical History:  Diagnosis Date  . Abnormal stress test 02/10/2015  . Acute on chronic systolic and diastolic heart failure, NYHA class 1 ([place]) 12/31/2014  . Anemia   . Anemia in chronic kidney disease 09/03/2012  .  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 Asthma   . Asthma, chronic 06/04/2012  . Asthma, chronic, mild intermittent, with acute exacerbation 12/22/2015  . Bilateral lower extremity edema 12/22/2015  . CAD (coronary artery disease) 02/18/2015  . Chronic combined systolic (congestive) and diastolic (congestive) heart failure ([place])    a. 12/31/14: 2D ECHO: EF 40-45%, HK of inf myocardium, G1DD, mod MR  . Chronic combined systolic and diastolic CHF (congestive heart failure) ([place]) 02/10/2015  . Chronic systolic heart failure ([place]) 02/25/2015  . CKD (chronic kidney disease), stage III    stage 3 kidney disease  . CKD (chronic kidney disease), stage V ([place]) 08/30/2018  . COLONIC POLYPS, HX OF 04/13/2009   Qualifier: Diagnosis of  By: [place].   . Coronary artery disease    a. LHC 01/2015 - triple vessel CAD (mod oLM, mLAD, severe mRCA, intermediate branch stenosis, CTO of mCx). Plan CABG 02/2015.  Marland Kitchen Demand ischemia ([place]) 12/31/2014  . DM type 2, uncontrolled, with renal complications ([place]) 5/46/5681  . Dyspnea     due to fluid  . Elevated troponin   . Essential hypertension 10/09/2013  . Folliculitis of perineum 10/01/2012  . Gastric erosion   . GERD (gastroesophageal reflux disease)   . History of blood transfusion   . Hyperlipidemia   . Hyperlipidemia with target LDL less than 100 12/31/2014  . Hypertension   . Hypertensive heart disease with heart failure ([place]) 02/25/2015  . IDA (iron deficiency anemia) 01/23/2016  . Melena 01/23/2016  . 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 Mitral regurgitation    a. Mild-mod by echo 12/2014.  . Morbid obesity ([place]) 12/29/2014  . Myocardial infarction [facility])    pt. states per [name] he has in the past  . NSVT (nonsustained ventricular tachycardia) ([place])    a. 9 beats during 01/2015 adm. BB titrated.  . Obesity    a. BMI 33  . Obesity (BMI 30-39.9) 05/05/2013  . OSA (obstructive sleep apnea) 01/16/2018   Mild  obstructive sleep apnea overall with an AHI of 7.3/h and no significant central sleep apnea. Severe obstructive sleep apnea during REM sleep with an AHI of 34.3/h.  Now on CPAP at 6 cm H2O.   . Other malaise and fatigue 09/03/2012  . 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 PAD (peripheral artery disease) ([place]) 03/02/2015  . Pneumonia   . Sleep apnea    2019, Dr.Turner diagnoised   . Small bowel lesion   . Symptomatic anemia 08/29/2018  . 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 Type II diabetes mellitus ([place])   . Wears dentures    Past Surgical History:  Procedure Laterality Date  . BACK SURGERY    . BASCILIC VEIN TRANSPOSITION Left 11/14/2018  Procedure: FIRST STAGE BASILIC VEIN TRANSPOSITION LEFT ARM;  Surgeon: Serafina Mitchell, MD;  Location: [facility];  Service: Vascular;  Laterality: Left;  . BASCILIC VEIN TRANSPOSITION Left 01/28/2019   Procedure: BASILIC VEIN TRANSPOSITION SECOND STAGE;  Surgeon: Serafina Mitchell, MD;  Location: [facility];  Service: Vascular;  Laterality: Left;  . BONE MARROW BIOPSY    . CARDIAC CATHETERIZATION N/A 02/09/2015   Procedure: Left Heart Cath and Coronary Angiography;  Surgeon: Burnell Blanks, MD;  Location: [facility];  Service: Cardiovascular;  Laterality: N/A;  . COLONOSCOPY  2011   Dr. [name]: Ileocecal valve appeared normal, scattered pancolonic diverticulosis, difficult bowel prep making smaller lesions potentially missed. Recommended three-year follow-up colonoscopy.  . COLONOSCOPY  2003   Dr. [name]: Suspicious lesion at the ileocecal valve, multiple biopsies benign, pancolonic diverticulosis.  . COLONOSCOPY N/A 02/15/2016   diverticulosis in sigmoid and descending colon, single 5 mm polyp at splenic flexure (Tubular adenoma)  . CORONARY ARTERY BYPASS GRAFT N/A 02/18/2015   Procedure: CORONARY ARTERY BYPASS GRAFTING (CABG) x  four, using bilateral internal mammary arteries and right leg greater saphenous vein harvested endoscopically;   Surgeon: [name], MD;  Location: [facility] OR;  Service: Open Heart Surgery;  Laterality: N/A;  . ESOPHAGOGASTRODUODENOSCOPY N/A 02/15/2016   normal  . EYE SURGERY Right    July 2019   . GIVENS CAPSULE STUDY N/A 01/08/2017   couple of gastric and small bowel eroions in setting of aspirin 81 mg daily but nothing concerning, continue Hematology follow-up  . HERNIA REPAIR  9357   Umbilical  . LUMBAR [place] SURGERY  2006   L4 & L5  . REFRACTIVE SURGERY Left    2019   . REFRACTIVE SURGERY Right    2019  . TEE WITHOUT CARDIOVERSION N/A 02/18/2015   Procedure: TRANSESOPHAGEAL ECHOCARDIOGRAM (TEE);  Surgeon: [name], MD;  Location: [facility];  Service: Open Heart Surgery;  Laterality: N/A;  . TONSILLECTOMY  1962    FAMILY HISTORY Family History  Problem Relation Age of Onset  . Diabetes Mother   . Heart disease Mother        later in life, age >59  . Heart disease Father        heart failure later in life  . Hypertension Father   . Stroke Sister   . Kidney disease Daughter   . Sudden death Daughter   . Colon cancer Paternal Uncle        Passed from colon CA  . Heart attack Neg Hx     SOCIAL HISTORY Social History   Tobacco Use  . Smoking status: Former Smoker    Years: 17.00    Types: Cigars    Quit date: 12/31/2014    Years since quitting: 4.5  . Smokeless tobacco: Never Used  Substance Use Topics  . Alcohol use: Yes    Alcohol/week: 3.0 standard drinks    Types: 3 Glasses of wine per week    Comment: occasional glass of wine.  . Drug use: No         OPHTHALMIC EXAM:  Base Eye Exam    Visual Acuity (ETDRS)      Right Left   Dist [place] 20/25 -2 20/40 +2   Dist ph [place]  20/30 +2       Tonometry (Tonopen, 9:56 AM)      Right Left   Pressure 13 10       Pupils  Pupils Dark Light Shape React APD   Right PERRL 4 3 Round Brisk None   Left PERRL 4 3 Round Brisk None       Visual Fields (Counting fingers)      Left Right    Full Full       Extraocular  Movement      Right Left    Full Full       Neuro/Psych    Oriented x3: Yes   Mood/Affect: Normal       Dilation    Left eye: 1.0% Mydriacyl, 2.5% Phenylephrine @ 9:56 AM        Slit Lamp and Fundus Exam    External Exam      Right Left   External Normal Normal       Slit Lamp Exam      Right Left   Lids/Lashes Normal Normal   Conjunctiva/Sclera White and quiet White and quiet   Cornea Clear Clear   Anterior Chamber Deep and quiet Deep and quiet   Iris Round and reactive Round and reactive   Lens 2+ Nuclear sclerosis 2+ Nuclear sclerosis   Anterior Vitreous Normal Normal       Fundus Exam      Right Left   Posterior Vitreous  Normal   Disc  Normal   C/D Ratio  0.4   Macula  Microaneurysms, Clinically significant macular edema, Mild clinically significant macular edema   Vessels  Quiet proliferative diabetic retinopathy   Periphery  Normal          IMAGING AND PROCEDURES  Imaging and Procedures for 07/07/19  OCT, Retina - OU - Both Eyes       Right Eye Quality was good. Scan locations included subfoveal. Central Foveal Thickness: 315. Progression has improved. Findings include abnormal foveal contour.   Left Eye Quality was borderline. Scan locations included subfoveal. Central Foveal Thickness: 255. Progression has improved. Findings include abnormal foveal contour.   Notes OD, CSME temporal to the fovea, now 5 days status post intravitreal Eylea.  May still need focal laser treatment in this region should it not resolve.  OS, now 1 month status post intravitreal Eylea for CSME temporally.  This area is has improved with less thickening       Intravitreal Injection, Pharmacologic Agent - OS - Left Eye       Time Out 07/07/2019. 10:56 AM. Confirmed correct patient, procedure, site, and patient consented.   Anesthesia Topical anesthesia was used. Anesthetic medications included Ak[place] 3.5%.   Procedure Preparation included Tobramycin 0.3%.    Injection:  2 mg aflibercept Alfonse Flavors) SOLN   NDC: [id number], Lot: [id number]   Route: Intravitreal, Site: Left Eye, Waste: 0 mg  Post-op Post injection exam found visual acuity of at least counting fingers. The patient tolerated the procedure well. There were no complications. The patient received written and verbal post procedure care education. Post injection medications were not given.                 ASSESSMENT/PLAN:  No problem-specific Assessment & Plan notes found for this encounter.      ICD-10-CM   1. 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  May still need focal laser treatment in this region should it not resolve.  OS, now 1 month status post intravitreal Eylea for CSME temporally.  This area is has improved with less thickening  2.  Need intravitreal Eylea OS today  3.  Plan is for control of CSME OS and deliver focal laser treatment temporal to the fovea in 3 to 4 weeks  4.  OD with more extensive CSME, repeat examination is scheduled  Ophthalmic Meds Ordered this visit:  Meds ordered this encounter  Medications  . aflibercept (EYLEA) SOLN 2 mg       Return in about 4 weeks (around 08/04/2019) for dilate, OS, FOCAL, OCT.  There are no Patient Instructions on file for this visit.   Explained the diagnoses, plan, and follow up with the patient and they expressed understanding.  Patient expressed understanding of the importance of proper follow up care.   Clent Demark Azari Janssens M.D. Diseases & Surgery of the Retina and Vitreous Retina & Diabetic [place] 07/07/19     Abbreviations: M myopia (nearsighted); A astigmatism; H hyperopia (farsighted); P presbyopia; Mrx  spectacle prescription;  CTL contact lenses; OD right eye; OS left eye; OU both eyes  XT exotropia; ET esotropia; PEK punctate epithelial keratitis; PEE punctate epithelial erosions; DES dry eye syndrome; MGD meibomian gland dysfunction; ATs artificial tears; PFAT's preservative free artificial tears; [place] nuclear sclerotic cataract; PSC posterior subcapsular cataract; ERM epi-retinal membrane; PVD posterior vitreous detachment; RD retinal detachment; DM diabetes mellitus; DR diabetic retinopathy; NPDR non-proliferative diabetic retinopathy; PDR proliferative diabetic retinopathy; CSME clinically significant macular edema; DME diabetic macular edema; dbh dot blot hemorrhages; CWS cotton wool spot; POAG primary open angle glaucoma; C/D cup-to-disc ratio; HVF humphrey visual field; GVF goldmann visual field; OCT optical coherence tomography; IOP intraocular pressure; BRVO Branch retinal vein occlusion; CRVO central retinal vein occlusion; CRAO central retinal artery occlusion; BRAO branch retinal artery occlusion; RT retinal tear; SB scleral buckle; PPV pars plana vitrectomy; VH Vitreous hemorrhage; PRP panretinal laser photocoagulation; IVK intravitreal kenalog; VMT vitreomacular traction; MH Macular hole;  NVD neovascularization of the disc; NVE neovascularization elsewhere; AREDS age related eye disease study; ARMD age related macular degeneration; POAG primary open angle glaucoma; EBMD epithelial/anterior basement membrane dystrophy; ACIOL anterior chamber intraocular lens; IOL intraocular lens; PCIOL posterior chamber intraocular lens; Phaco/IOL phacoemulsification with intraocular lens placement; [place] photorefractive keratectomy; LASIK laser assisted in situ keratomileusis; HTN hypertension; DM diabetes mellitus; COPD chronic obstructive pulmonary disease

## 2019-07-13 ENCOUNTER — Other Ambulatory Visit: Payer: Self-pay | Admitting: Nurse Practitioner

## 2019-07-14 ENCOUNTER — Telehealth: Payer: Medicare Other | Admitting: Nurse Practitioner

## 2019-07-14 ENCOUNTER — Ambulatory Visit: Payer: Medicare Other | Admitting: Family

## 2019-07-14 ENCOUNTER — Ambulatory Visit: Payer: Medicare Other | Admitting: Nurse Practitioner

## 2019-07-15 ENCOUNTER — Encounter (HOSPITAL_COMMUNITY)
Admission: RE | Admit: 2019-07-15 | Discharge: 2019-07-15 | Disposition: A | Discharge status: Home / Self Care | Payer: Medicare Other | Source: Ambulatory Visit | Attending: Nephrology | Admitting: Nephrology

## 2019-07-15 ENCOUNTER — Other Ambulatory Visit: Payer: Self-pay

## 2019-07-15 ENCOUNTER — Ambulatory Visit (HOSPITAL_COMMUNITY): Payer: Medicare Other

## 2019-07-15 ENCOUNTER — Encounter (HOSPITAL_COMMUNITY): Payer: Self-pay

## 2019-07-15 DIAGNOSIS — D631 Anemia in chronic kidney disease: Secondary | ICD-10-CM | POA: Insufficient documentation

## 2019-07-15 DIAGNOSIS — N184 Chronic kidney disease, stage 4 (severe): Secondary | ICD-10-CM | POA: Diagnosis not present

## 2019-07-15 LAB — CBC WITH DIFFERENTIAL/PLATELET
Abs Immature Granulocytes: 0.01 10*3/uL (ref 0.00–0.07)
Basophils Absolute: 0 10*3/uL (ref 0.0–0.1)
Basophils Relative: 1 %
Eosinophils Absolute: 0.3 10*3/uL (ref 0.0–0.5)
Eosinophils Relative: 8 %
HCT: 33.4 % — ABNORMAL LOW (ref 39.0–52.0)
Hemoglobin: 10.8 g/dL — ABNORMAL LOW (ref 13.0–17.0)
Immature Granulocytes: 0 %
Lymphocytes Relative: 14 %
Lymphs Abs: 0.6 10*3/uL — ABNORMAL LOW (ref 0.7–4.0)
MCH: 31.9 pg (ref 26.0–34.0)
MCHC: 32.3 g/dL (ref 30.0–36.0)
MCV: 98.5 fL (ref 80.0–100.0)
Monocytes Absolute: 0.7 10*3/uL (ref 0.1–1.0)
Monocytes Relative: 15 %
Neutro Abs: 2.9 10*3/uL (ref 1.7–7.7)
Neutrophils Relative %: 62 %
Platelets: 185 10*3/uL (ref 150–400)
RBC: 3.39 MIL/uL — ABNORMAL LOW (ref 4.22–5.81)
RDW: 14.8 % (ref 11.5–15.5)
WBC: 4.6 10*3/uL (ref 4.0–10.5)
nRBC: 0 % (ref 0.0–0.2)

## 2019-07-15 LAB — RENAL FUNCTION PANEL
Albumin: 3.7 g/dL (ref 3.5–5.0)
Anion gap: 16 — ABNORMAL HIGH (ref 5–15)
BUN: 132 mg/dL — ABNORMAL HIGH (ref 8–23)
CO2: 21 mmol/L — ABNORMAL LOW (ref 22–32)
Calcium: 8.8 mg/dL — ABNORMAL LOW (ref 8.9–10.3)
Chloride: 98 mmol/L (ref 98–111)
Creatinine, Ser: 6.93 mg/dL — ABNORMAL HIGH (ref 0.61–1.24)
GFR calc Af Amer: 8 mL/min — ABNORMAL LOW (ref >60)
GFR calc non Af Amer: 7 mL/min — ABNORMAL LOW (ref >60)
Glucose, Bld: 179 mg/dL — ABNORMAL HIGH (ref 70–99)
Phosphorus: 6.4 mg/dL — ABNORMAL HIGH (ref 2.5–4.6)
Potassium: 3.9 mmol/L (ref 3.5–5.1)
Sodium: 135 mmol/L (ref 135–145)

## 2019-07-15 LAB — IRON AND TIBC
Iron: 44 ug/dL — ABNORMAL LOW (ref 45–182)
Saturation Ratios: 15 % — ABNORMAL LOW (ref 17.9–39.5)
TIBC: 287 ug/dL (ref 250–450)
UIBC: 243 ug/dL

## 2019-07-15 LAB — POCT HEMOGLOBIN-HEMACUE: Hemoglobin: 11 g/dL — ABNORMAL LOW (ref 13.0–17.0)

## 2019-07-15 LAB — FERRITIN: Ferritin: 444 ng/mL — ABNORMAL HIGH (ref 24–336)

## 2019-07-15 MED ORDER — EPOETIN ALFA-EPBX 10000 UNIT/ML IJ SOLN
20000.0000 [IU] | Freq: Once | INTRAMUSCULAR | Status: AC
  Administered 2019-07-15: 20000 [IU] via SUBCUTANEOUS
  Filled 2019-07-15: qty 2

## 2019-07-23 ENCOUNTER — Encounter: Payer: Self-pay | Admitting: Nurse Practitioner

## 2019-07-23 ENCOUNTER — Other Ambulatory Visit: Payer: Self-pay

## 2019-07-23 ENCOUNTER — Telehealth: Payer: Self-pay

## 2019-07-23 ENCOUNTER — Telehealth (INDEPENDENT_AMBULATORY_CARE_PROVIDER_SITE_OTHER): Payer: Medicare Other | Admitting: Nurse Practitioner

## 2019-07-23 ENCOUNTER — Other Ambulatory Visit: Payer: Self-pay | Admitting: Nurse Practitioner

## 2019-07-23 DIAGNOSIS — Z7189 Other specified counseling: Secondary | ICD-10-CM | POA: Diagnosis not present

## 2019-07-23 NOTE — Telephone Encounter (Signed)
[name], Henry Henry are scheduled for a virtual visit with your provider today.    Just as we do with appointments in the office, we must obtain your consent to participate.  Your consent will be active for this visit and any virtual visit you may have with one of our providers in the next 365 days.    If you have a MyChart account, I can also send a copy of this consent to you electronically.  All virtual visits are billed to your insurance company just like a traditional visit in the office.  As this is a virtual visit, video technology does not allow for your provider to perform a traditional examination.  This may limit your provider's ability to fully assess your condition.  If your provider identifies any concerns that need to be evaluated in person or the need to arrange testing such as labs, EKG, etc, we will make arrangements to do so.    Although advances in technology are sophisticated, we cannot ensure that it will always work on either your end or our end.  If the connection with a video visit is poor, we may have to switch to a telephone visit.  With either a video or telephone visit, we are not always able to ensure that we have a secure connection.   I need to obtain your verbal consent now.   Are you willing to proceed with your visit today?   Henry Henry has provided verbal consent on 07/23/2019 for a virtual visit (video or telephone).   Henry Henry, [facility] 07/23/2019  9:32 AM

## 2019-07-23 NOTE — Progress Notes (Signed)
This service is provided via telemedicine  No vital signs collected/recorded due to the encounter was a telemedicine visit.   Location of patient (ex: home, work):  Home  Patient consents to a telephone visit:  Yes, see telephone encounter dated 06/10/20221  Location of the provider (ex: office, home):  [place]  Name of any referring provider:  N/A  Names of all persons participating in the telemedicine service and their role in the encounter:  Sherrie Mustache, Nurse Practitioner, [name], CMA, and patient.   Time spent on call:  8 minutes with medical assistant      Careteam: Patient Care Team: [name], NP as PCP - General (Geriatric Medicine) Jerline Pain, MD as PCP - Cardiology (Cardiology) [name], MD as Consulting Physician (Gastroenterology) Zadie Rhine Clent Demark, MD as Consulting Physician (Ophthalmology) Elmarie Shiley, MD as Consulting Physician (Nephrology) Derek Jack, MD as Consulting Physician (Hematology)   Advanced Directive information    No Known Allergies  Chief Complaint  Patient presents with  . Advanced Directive    MOST form completion     HPI: Patient is a 69 y.o. male for MOST form completion.  He reports he is doing better than ever.  Pt with a hx of MGUS, multiple myeloma, CKD stage 5, DM, anemia, CAD, CHF, OSA, asthma and others. He is followed by multiple specialist routinely. No acute issues today.   Review of Systems:  Review of Systems  Constitutional: Negative.     Past Medical History:  Diagnosis Date  . Abnormal stress test 02/10/2015  . Acute on chronic systolic and diastolic heart failure, NYHA class 1 ([place]) 12/31/2014  . Anemia   . Anemia in chronic kidney disease 09/03/2012  . 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 Asthma   . Asthma, chronic 06/04/2012  . Asthma, chronic, mild intermittent, with acute exacerbation 12/22/2015  . Bilateral lower extremity edema 12/22/2015  . CAD  (coronary artery disease) 02/18/2015  . Chronic combined systolic (congestive) and diastolic (congestive) heart failure ([place])    a. 12/31/14: 2D ECHO: EF 40-45%, HK of inf myocardium, G1DD, mod MR  . Chronic combined systolic and diastolic CHF (congestive heart failure) ([place]) 02/10/2015  . Chronic systolic heart failure ([place]) 02/25/2015  . CKD (chronic kidney disease), stage III    stage 3 kidney disease  . CKD (chronic kidney disease), stage V ([place]) 08/30/2018  . COLONIC POLYPS, HX OF 04/13/2009   Qualifier: Diagnosis of  By: West[place] Pam.   . Coronary artery disease    a. LHC 01/2015 - triple vessel CAD (mod oLM, mLAD, severe mRCA, intermediate branch stenosis, CTO of mCx). Plan CABG 02/2015.  Marland Kitchen Demand ischemia ([place]) 12/31/2014  . DM type 2, uncontrolled, with renal complications ([place]) 9/40/7680  . Dyspnea     due to fluid  . Elevated troponin   . Essential hypertension 10/09/2013  . Folliculitis of perineum 10/01/2012  . Gastric erosion   . GERD (gastroesophageal reflux disease)   . History of blood transfusion   . Hyperlipidemia   . Hyperlipidemia with target LDL less than 100 12/31/2014  . Hypertension   . Hypertensive heart disease with heart failure ([place]) 02/25/2015  . IDA (iron deficiency anemia) 01/23/2016  . Melena 01/23/2016  . 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 Mitral regurgitation    a. Mild-mod by echo 12/2014.  . Morbid obesity ([place]) 12/29/2014  . Myocardial infarction ([place])  pt. states per [name] he has in the past  . NSVT (nonsustained ventricular tachycardia) ([place])    a. 9 beats during 01/2015 adm. BB titrated.  . Obesity    a. BMI 33  . Obesity (BMI 30-39.9) 05/05/2013  . OSA (obstructive sleep apnea) 01/16/2018   Mild obstructive sleep apnea overall with an AHI of 7.3/h and no significant central sleep apnea. Severe obstructive sleep apnea during REM sleep with an AHI of 34.3/h.  Now on CPAP at 6 cm H2O.   . Other malaise  and fatigue 09/03/2012  . 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 PAD (peripheral artery disease) ([place]) 03/02/2015  . Pneumonia   . Sleep apnea    2019, Dr.Turner diagnoised   . Small bowel lesion   . Symptomatic anemia 08/29/2018  . 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 Type II diabetes mellitus ([place])   . Wears dentures    Past Surgical History:  Procedure Laterality Date  . BACK SURGERY    . BASCILIC VEIN TRANSPOSITION Left 11/14/2018   Procedure: FIRST STAGE BASILIC VEIN TRANSPOSITION LEFT ARM;  Surgeon: Serafina Mitchell, MD;  Location: [facility];  Service: Vascular;  Laterality: Left;  . BASCILIC VEIN TRANSPOSITION Left 01/28/2019   Procedure: BASILIC VEIN TRANSPOSITION SECOND STAGE;  Surgeon: Serafina Mitchell, MD;  Location: [facility];  Service: Vascular;  Laterality: Left;  . BONE MARROW BIOPSY    . CARDIAC CATHETERIZATION N/A 02/09/2015   Procedure: Left Heart Cath and Coronary Angiography;  Surgeon: Burnell Blanks, MD;  Location: [facility];  Service: Cardiovascular;  Laterality: N/A;  . COLONOSCOPY  2011   Dr. [name]: Ileocecal valve appeared normal, scattered pancolonic diverticulosis, difficult bowel prep making smaller lesions potentially missed. Recommended three-year follow-up colonoscopy.  . COLONOSCOPY  2003   Dr. [name]: Suspicious lesion at the ileocecal valve, multiple biopsies benign, pancolonic diverticulosis.  . COLONOSCOPY N/A 02/15/2016   diverticulosis in sigmoid and descending colon, single 5 mm polyp at splenic flexure (Tubular adenoma)  . CORONARY ARTERY BYPASS GRAFT N/A 02/18/2015   Procedure: CORONARY ARTERY BYPASS GRAFTING (CABG) x  four, using bilateral internal mammary arteries and right leg greater saphenous vein harvested endoscopically;  Surgeon: [name], MD;  Location: [facility];  Service: Open Heart Surgery;  Laterality: N/A;  . ESOPHAGOGASTRODUODENOSCOPY N/A 02/15/2016   normal  . EYE SURGERY Right    July 2019   . GIVENS CAPSULE STUDY  N/A 01/08/2017   couple of gastric and small bowel eroions in setting of aspirin 81 mg daily but nothing concerning, continue Hematology follow-up  . HERNIA REPAIR  6812   Umbilical  . LUMBAR [facility] SURGERY  2006   L4 & L5  . REFRACTIVE SURGERY Left    2019   . REFRACTIVE SURGERY Right    2019  . TEE WITHOUT CARDIOVERSION N/A 02/18/2015   Procedure: TRANSESOPHAGEAL ECHOCARDIOGRAM (TEE);  Surgeon: [name], MD;  Location: [facility];  Service: Open Heart Surgery;  Laterality: N/A;  . TONSILLECTOMY  1962   Social History:   reports that he quit smoking about 4 years ago. His smoking use included cigars. He quit after 17.00 years of use. He has never used smokeless tobacco. He reports current alcohol use of about 3.0 standard drinks of alcohol per week. He reports that he does not use drugs.  Family History  Problem Relation Age of Onset  . Diabetes Mother   . Heart disease Mother  later in life, age >31  . Heart disease Father        heart failure later in life  . Hypertension Father   . Stroke Sister   . Kidney disease Daughter   . Sudden death Daughter   . Colon cancer Paternal Uncle        Passed from colon CA  . Heart attack Neg Hx     Medications: Patient's Medications  New Prescriptions   No medications on file  Previous Medications   ACETAMINOPHEN (TYLENOL) 500 MG TABLET    Take 1,000 mg by mouth 2 (two) times daily as needed for moderate pain.   ALBUTEROL (PROVENTIL) (2.5 MG/3ML) 0.083% NEBULIZER SOLUTION    USE ONE VIAL IN NEBULIZER EVERY 6 HOURS AS NEEDED FOR WHEEZING FOR SHORTNESS OF BREATH   ALBUTEROL (VENTOLIN HFA) 108 (90 BASE) MCG/ACT INHALER    Inhale 2 puffs into the lungs every 6 (six) hours as needed for wheezing or shortness of breath.   AMLODIPINE (NORVASC) 10 MG TABLET    Take 1 tablet by mouth once daily   ASPIRIN EC 81 MG TABLET    Take 1 tablet (81 mg total) by mouth daily.   ATORVASTATIN (LIPITOR) 40 MG TABLET    Take 1 tablet (40 mg total) by  mouth every evening.   CARVEDILOL (COREG) 25 MG TABLET    TAKE 1 TABLET BY MOUTH TWICE DAILY WITH A MEAL   CHOLECALCIFEROL (VITAMIN D3) 50 MCG (2000 UT) TABS    Take 4,000 Units by mouth daily.   CINNAMON 500 MG CAPSULE    Take 1,000 mg by mouth daily.    COENZYME Q10 (CO Q 10 PO)    Take 200 mg by mouth daily.    EPOETIN ALFA-EPBX (RETACRIT) 24268 UNIT/ML INJECTION    20,000 Units every 14 (fourteen) days.    FLUTICASONE (FLONASE) 50 MCG/ACT NASAL SPRAY    Place 2 sprays into both nostrils daily as needed for allergies or rhinitis (SEASONAL ALLERGIES).   FLUTICASONE-SALMETEROL (ADVAIR) 500-50 MCG/DOSE AEPB    Inhale 1 puff into the lungs 2 (two) times daily as needed (respiratory issues.).   FUROSEMIDE (LASIX) 40 MG TABLET    Take 2 tablets by mouth twice daily   GLUCOSE BLOOD (ONE TOUCH ULTRA TEST) TEST STRIP    Use to test blood sugar three times daily. Dx: E11.21   HYDRALAZINE (APRESOLINE) 50 MG TABLET    Take 50 mg by mouth 2 (two) times daily.   INSULIN DETEMIR (LEVEMIR) 100 UNIT/ML INJECTION    Inject 0.15 mLs (15 Units total) into the skin daily. In the morning   ISOSORBIDE MONONITRATE (IMDUR) 30 MG 24 HR TABLET    Take 1 tablet by mouth once daily   METOLAZONE (ZAROXOLYN) 5 MG TABLET    Take 5 mg by mouth 2 (two) times a week.   MONTELUKAST (SINGULAIR) 10 MG TABLET    TAKE 1 TABLET BY MOUTH AT BEDTIME   MULTIPLE VITAMIN (MULTIVITAMIN WITH MINERALS) TABS TABLET    Take 1 tablet by mouth daily.   NITROGLYCERIN (NITROSTAT) 0.4 MG SL TABLET    Place 1 tablet (0.4 mg total) under the tongue every 5 (five) minutes as needed for chest pain.   PROAIR HFA 108 (90 BASE) MCG/ACT INHALER    INHALE 2 PUFFS INTO LUNGS EVERY 6 HOURS AS NEEDED FOR SHORTNESS OF BREATH  Modified Medications   No medications on file  Discontinued Medications   No medications on file  Physical Exam:  There were no vitals filed for this visit. There is no height or weight on file to calculate BMI. Wt Readings from  Last 3 Encounters:  07/15/19 203 lb (92.1 kg)  06/15/19 214 lb 12.8 oz (97.4 kg)  05/25/19 214 lb 12.8 oz (97.4 kg)     Labs reviewed: Basic Metabolic Panel: Recent Labs    11/19/18 1200 11/19/18 1213 12/17/18 1134 01/15/19 1044 02/09/19 1338 02/10/19 1231 05/18/19 1021 06/15/19 1207 07/15/19 1039  NA  --    < > 139   < > 137   < > 139 138 135  K  --    < > 4.1   < > 4.1   < > 3.8 4.3 3.9  CL  --    < > 102   < > 100   < > 102 103 98  CO2  --    < > 23   < > 25   < > 22 22 21*  GLUCOSE  --    < > 150*   < > 256*   < > 204* 219* 179*  BUN  --    < > 137*   < > 124*   < > 136* 146* 132*  CREATININE  --    < > 7.07*   < > 6.65*   < > 6.98* 6.99* 6.93*  CALCIUM  --    < > 8.9   < > 9.2   < > 8.8* 8.8* 8.8*  MG 2.9*  --  2.0  --  2.0  --   --   --   --   PHOS  --    < > 5.4*   < > 4.4   < > 5.8* 5.1* 6.4*   < > = values in this interval not displayed.   Liver Function Tests: Recent Labs    10/14/18 1430 10/14/18 1430 11/19/18 1119 12/17/18 1134 02/10/19 1231 02/23/19 1444 05/18/19 1021 06/15/19 1207 07/15/19 1039  AST 17  --  18  --  21  --   --   --   --   ALT 15  --  7  --  13  --   --   --   --   ALKPHOS 51  --  46  --  44  --   --   --   --   BILITOT 0.2*  --  0.2*  --  0.6  --   --   --   --   PROT 6.4*  --  6.5  --  7.0  --   --   --   --   ALBUMIN 3.4*   < > 3.5   < > 3.9   < > 3.6 3.5 3.7   < > = values in this interval not displayed.   No results for input(s): LIPASE, AMYLASE in the last 8760 hours. No results for input(s): AMMONIA in the last 8760 hours. CBC: Recent Labs    05/18/19 1021 06/01/19 1001 06/15/19 1207 06/15/19 1207 06/30/19 0849 07/15/19 1014 07/15/19 1039  WBC 4.4  --  4.7  --   --   --  4.6  NEUTROABS 2.8  --  3.4  --   --   --  2.9  HGB 10.0*   < > 9.5*   < > 10.1* 11.0* 10.8*  HCT 31.7*  --  29.7*  --   --   --  33.4*  MCV  96.9  --  97.4  --   --   --  98.5  PLT 208  --  169  --   --   --  185   < > = values in this interval  not displayed.   Lipid Panel: Recent Labs    08/28/18 1036 01/07/19 1108  CHOL 151 114  HDL 51 30*  LDLCALC 80 62  TRIG 104 132  CHOLHDL 3.0 3.8   TSH: No results for input(s): TSH in the last 8760 hours. A1C: Lab Results  Component Value Date   HGBA1C 7.5 (H) 05/20/2019     Assessment/Plan 1. Advance care planning -discussed, MOST form reviewed answered questions and completed with pt.  Next appt: 07/23/2019 as scheduled  Henry Carroll  [facility] [phone]   Virtual Visit via Video Note  I connected with Henry Carroll on 07/23/19 at  9:30 AM EDT by a video enabled telemedicine application and verified that I am speaking with the correct person using two identifiers.  Location: Patient: home Provider: [place]   I discussed the limitations of evaluation and management by telemedicine and the availability of in person appointments. The patient expressed understanding and agreed to proceed.    I discussed the assessment and treatment plan with the patient. The patient was provided an opportunity to ask questions and all were answered. The patient agreed with the plan and demonstrated an understanding of the instructions.   The patient was advised to call back or seek an in-person evaluation if the symptoms worsen or if the condition fails to improve as anticipated.  I provided 17 minutes of non-face-to-face time during this encounter to discuss advance care [place]. Henry Carroll, AGNP Avs printed and mailed.

## 2019-07-26 ENCOUNTER — Other Ambulatory Visit: Payer: Self-pay | Admitting: Nurse Practitioner

## 2019-07-27 ENCOUNTER — Other Ambulatory Visit: Payer: Self-pay | Admitting: *Deleted

## 2019-07-27 DIAGNOSIS — J45909 Unspecified asthma, uncomplicated: Secondary | ICD-10-CM

## 2019-07-27 NOTE — Telephone Encounter (Signed)
Received fax from [facility] stating that Rx sent needs a Dx code attached.  Pended Rx with Dx code and sent to [facility] for approval due to [place].

## 2019-07-28 MED ORDER — ALBUTEROL SULFATE (2.5 MG/3ML) 0.083% IN NEBU: INHALATION_SOLUTION | RESPIRATORY_TRACT | 11 refills | Status: DC

## 2019-07-29 ENCOUNTER — Encounter (HOSPITAL_COMMUNITY)
Admission: RE | Admit: 2019-07-29 | Discharge: 2019-07-29 | Disposition: A | Discharge status: Home / Self Care | Payer: Medicare Other | Source: Ambulatory Visit | Attending: Nephrology | Admitting: Nephrology

## 2019-07-29 ENCOUNTER — Encounter (HOSPITAL_COMMUNITY): Payer: Self-pay

## 2019-07-29 ENCOUNTER — Other Ambulatory Visit: Payer: Self-pay

## 2019-07-29 DIAGNOSIS — N184 Chronic kidney disease, stage 4 (severe): Secondary | ICD-10-CM | POA: Diagnosis not present

## 2019-07-29 LAB — POCT HEMOGLOBIN-HEMACUE: Hemoglobin: 9.9 g/dL — ABNORMAL LOW (ref 13.0–17.0)

## 2019-07-29 MED ORDER — EPOETIN ALFA-EPBX 10000 UNIT/ML IJ SOLN
20000.0000 [IU] | Freq: Once | INTRAMUSCULAR | Status: AC
  Administered 2019-07-29: 20000 [IU] via SUBCUTANEOUS

## 2019-07-30 DIAGNOSIS — E1136 Type 2 diabetes mellitus with diabetic cataract: Secondary | ICD-10-CM | POA: Diagnosis not present

## 2019-07-30 DIAGNOSIS — H2513 Age-related nuclear cataract, bilateral: Secondary | ICD-10-CM | POA: Diagnosis not present

## 2019-07-30 DIAGNOSIS — H25013 Cortical age-related cataract, bilateral: Secondary | ICD-10-CM | POA: Diagnosis not present

## 2019-07-30 LAB — HM DIABETES EYE EXAM

## 2019-08-05 ENCOUNTER — Other Ambulatory Visit: Payer: Self-pay

## 2019-08-05 ENCOUNTER — Ambulatory Visit (INDEPENDENT_AMBULATORY_CARE_PROVIDER_SITE_OTHER): Payer: Medicare Other | Admitting: Ophthalmology

## 2019-08-05 ENCOUNTER — Encounter (INDEPENDENT_AMBULATORY_CARE_PROVIDER_SITE_OTHER): Payer: Self-pay | Admitting: Ophthalmology

## 2019-08-05 DIAGNOSIS — E113512 Type 2 diabetes mellitus with proliferative diabetic retinopathy with macular edema, left eye: Secondary | ICD-10-CM

## 2019-08-05 NOTE — Progress Notes (Signed)
08/05/2019     CHIEF COMPLAINT Patient presents for Retina Follow Up   HISTORY OF PRESENT ILLNESS: Henry Carroll is a 69 y.o. male who presents to the clinic today for:   HPI    Retina Follow Up    Patient presents with  Diabetic Retinopathy.  In left eye.  This started 4 weeks ago.  Severity is mild.  Duration of 4 weeks.  Since onset it is stable.          Comments    4 Week Focal OS  Pt denies noticeable changes to [place] OU since last visit. Pt denies ocular pain, flashes of light, or floaters OU.  LBS: 117 this AM       Last edited by Rockie Neighbours, [place] on 08/05/2019  9:27 AM. (History)      Referring physician: [name], NP [place],  [place] [address]  HISTORICAL INFORMATION:   Selected notes from the MEDICAL RECORD NUMBER    Lab Results  Component Value Date   HGBA1C 7.5 (H) 05/20/2019     CURRENT MEDICATIONS: No current outpatient medications on file. (Ophthalmic Drugs)   No current facility-administered medications for this visit. (Ophthalmic Drugs)   Current Outpatient Medications (Other)  Medication Sig   acetaminophen (TYLENOL) 500 MG tablet Take 1,000 mg by mouth 2 (two) times daily as needed for moderate pain.   albuterol (PROVENTIL) (2.5 MG/3ML) 0.083% nebulizer solution Use One vial in Nebulizer every 6 hours as needed for wheezing and shortness of breath. Dx: Ro6.02   albuterol (VENTOLIN HFA) 108 (90 Base) MCG/ACT inhaler Inhale 2 puffs into the lungs every 6 (six) hours as needed for wheezing or shortness of breath.   amLODipine (NORVASC) 10 MG tablet Take 1 tablet by mouth once daily   aspirin EC 81 MG tablet Take 1 tablet (81 mg total) by mouth daily.   atorvastatin (LIPITOR) 40 MG tablet Take 1 tablet (40 mg total) by mouth every evening.   carvedilol (COREG) 25 MG tablet TAKE 1 TABLET BY MOUTH TWICE DAILY WITH A MEAL   Cholecalciferol (VITAMIN D3) 50 MCG (2000 UT) TABS Take 4,000 Units by mouth daily.    Cinnamon 500 MG capsule Take 1,000 mg by mouth daily.    Coenzyme Q10 (CO Q 10 PO) Take 200 mg by mouth daily.    epoetin alfa-epbx (RETACRIT) 78588 UNIT/ML injection 20,000 Units every 14 (fourteen) days.    fluticasone (FLONASE) 50 MCG/ACT nasal spray Place 2 sprays into both nostrils daily as needed for allergies or rhinitis (SEASONAL ALLERGIES).   Fluticasone-Salmeterol (ADVAIR) 500-50 MCG/DOSE AEPB Inhale 1 puff into the lungs 2 (two) times daily as needed (respiratory issues.).   furosemide (LASIX) 40 MG tablet Take 2 tablets by mouth twice daily   glucose blood (ONE TOUCH ULTRA TEST) test strip Use to test blood sugar three times daily. Dx: E11.21   hydrALAZINE (APRESOLINE) 50 MG tablet Take 50 mg by mouth 2 (two) times daily.   insulin detemir (LEVEMIR) 100 UNIT/ML injection Inject 0.15 mLs (15 Units total) into the skin daily. In the morning   isosorbide mononitrate (IMDUR) 30 MG 24 hr tablet Take 1 tablet by mouth once daily   metolazone (ZAROXOLYN) 5 MG tablet Take 5 mg by mouth 2 (two) times a week.   montelukast (SINGULAIR) 10 MG tablet TAKE 1 TABLET BY MOUTH AT BEDTIME   Multiple Vitamin (MULTIVITAMIN WITH MINERALS) TABS tablet Take 1 tablet by mouth daily.  nitroGLYCERIN (NITROSTAT) 0.4 MG SL tablet Place 1 tablet (0.4 mg total) under the tongue every 5 (five) minutes as needed for chest pain.   PROAIR HFA 108 (90 Base) MCG/ACT inhaler INHALE 2 PUFFS INTO LUNGS EVERY 6 HOURS AS NEEDED FOR SHORTNESS OF BREATH   No current facility-administered medications for this visit. (Other)   Facility-Administered Medications Ordered in Other Visits (Other)  Medication Route   regadenoson (LEXISCAN) injection SOLN 0.4 mg Intravenous      REVIEW OF SYSTEMS:    ALLERGIES No Known Allergies  PAST MEDICAL HISTORY Past Medical History:  Diagnosis Date   Abnormal stress test 02/10/2015   Acute on chronic systolic and diastolic heart failure, NYHA class 1 ([place])  12/31/2014   Anemia    Anemia in chronic kidney disease 09/03/2012   Arthritis    left  5th finger   Asthma    Asthma, chronic 06/04/2012   Asthma, chronic, mild intermittent, with acute exacerbation 12/22/2015   Bilateral lower extremity edema 12/22/2015   CAD (coronary artery disease) 02/18/2015   Chronic combined systolic (congestive) and diastolic (congestive) heart failure ([place])    a. 12/31/14: 2D ECHO: EF 40-45%, HK of inf myocardium, G1DD, mod MR   Chronic combined systolic and diastolic CHF (congestive heart failure) ([place]) 56/81/2751   Chronic systolic heart failure ([place]) 02/25/2015   CKD (chronic kidney disease), stage III    stage 3 kidney disease   CKD (chronic kidney disease), stage V ([place]) 08/30/2018   COLONIC POLYPS, HX OF 04/13/2009   Qualifier: Diagnosis of  By: Westly Pam.    Coronary artery disease    a. LHC 01/2015 - triple vessel CAD (mod oLM, mLAD, severe mRCA, intermediate branch stenosis, CTO of mCx). Plan CABG 02/2015.   Demand ischemia ([place]) 12/31/2014   DM type 2, uncontrolled, with renal complications ([place]) 7/00/1749   Dyspnea     due to fluid   Elevated troponin    Essential hypertension 4/49/6759   Folliculitis of perineum 10/01/2012   Gastric erosion    GERD (gastroesophageal reflux disease)    History of blood transfusion    Hyperlipidemia    Hyperlipidemia with target LDL less than 100 12/31/2014   Hypertension    Hypertensive heart disease with heart failure (HCC) 02/25/2015   IDA (iron deficiency anemia) 01/23/2016   Melena 01/23/2016   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   Mitral regurgitation    a. Mild-mod by echo 12/2014.   Morbid obesity ([place]) 12/29/2014   Myocardial infarction [facility] [address])    pt. states per [name] he has in the past   NSVT (nonsustained ventricular tachycardia) ([place])    a. 9 beats during 01/2015 adm. BB titrated.   Obesity    a. BMI 33   Obesity (BMI  30-39.9) 05/05/2013   OSA (obstructive sleep apnea) 01/16/2018   Mild obstructive sleep apnea overall with an AHI of 7.3/h and no significant central sleep apnea. Severe obstructive sleep apnea during REM sleep with an AHI of 34.3/h.  Now on CPAP at 6 cm H2O.    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   Type II diabetes mellitus ([place])    Wears dentures    Past Surgical History:  Procedure  Laterality Date   BACK SURGERY     BASCILIC VEIN TRANSPOSITION Left 11/14/2018   Procedure: FIRST STAGE BASILIC VEIN TRANSPOSITION LEFT ARM;  Surgeon: Serafina Mitchell, MD;  Location: [facility];  Service: Vascular;  Laterality: Left;   [place] Left 01/28/2019   Procedure: BASILIC VEIN TRANSPOSITION SECOND STAGE;  Surgeon: Serafina Mitchell, MD;  Location: [facility];  Service: Vascular;  Laterality: Left;   BONE MARROW BIOPSY     CARDIAC CATHETERIZATION N/A 02/09/2015   Procedure: Left Heart Cath and Coronary Angiography;  Surgeon: Burnell Blanks, MD;  Location: [facility];  Service: Cardiovascular;  Laterality: N/A;   COLONOSCOPY  2011   Dr. [name]: Ileocecal valve appeared normal, scattered pancolonic diverticulosis, difficult bowel prep making smaller lesions potentially missed. Recommended three-year follow-up colonoscopy.   COLONOSCOPY  2003   Dr. [name]: Suspicious lesion at the ileocecal valve, multiple biopsies benign, pancolonic diverticulosis.   COLONOSCOPY N/A 02/15/2016   diverticulosis in sigmoid and descending colon, single 5 mm polyp at splenic flexure (Tubular adenoma)   CORONARY ARTERY BYPASS GRAFT N/A 02/18/2015   Procedure: CORONARY ARTERY BYPASS GRAFTING (CABG) x  four, using bilateral internal mammary  arteries and right leg greater saphenous vein harvested endoscopically;  Surgeon: [name], MD;  Location: [facility];  Service: Open Heart Surgery;  Laterality: N/A;   ESOPHAGOGASTRODUODENOSCOPY N/A 02/15/2016   normal   EYE SURGERY Right    July 2019    GIVENS CAPSULE STUDY N/A 01/08/2017   couple of gastric and small bowel eroions in setting of aspirin 81 mg daily but nothing concerning, continue Hematology follow-up   HERNIA REPAIR  [id number]   Umbilical   LUMBAR [place]  2006   L4 & L5   REFRACTIVE SURGERY Left    2019    REFRACTIVE SURGERY Right    2019   TEE WITHOUT CARDIOVERSION N/A 02/18/2015   Procedure: TRANSESOPHAGEAL ECHOCARDIOGRAM (TEE);  Surgeon: [name], MD;  Location: [facility];  Service: Open Heart Surgery;  Laterality: N/A;   TONSILLECTOMY  1962    FAMILY HISTORY Family History  Problem Relation Age of Onset   Diabetes Mother    Heart disease Mother        later in life, age >62   Heart disease Father        heart failure later in life   Hypertension Father    Stroke Sister    Kidney disease Daughter    Sudden death Daughter    Colon cancer Paternal Uncle        Passed from colon CA   Heart attack Neg Hx     SOCIAL HISTORY Social History   Tobacco Use   Smoking status: Former Smoker    Years: 17.00    Types: Cigars    Quit date: 12/31/2014    Years since quitting: 4.5   Smokeless tobacco: Never Used  Vaping Use   Vaping Use: Never used  Substance Use Topics   Alcohol use: Yes    Alcohol/week: 3.0 standard drinks    Types: 3 Glasses of wine per week    Comment: occasional glass of wine.   Drug use: No         OPHTHALMIC EXAM: Base Eye Exam    Visual Acuity (ETDRS)      Right Left   Dist [place] 20/30 +2 20/40 +2   Dist ph [place] 20/25 +2 20/30 +2       Tonometry (Tonopen,  9:33 AM)      Right Left   Pressure 13 14       Pupils      Pupils Dark Light Shape React APD   Right PERRL 4 3 Round Brisk None   Left  PERRL 4 3 Round Brisk None       Visual Fields (Counting fingers)      Left Right    Full Full       Extraocular Movement      Right Left    Full Full       Neuro/Psych    Oriented x3: Yes   Mood/Affect: Normal       Dilation    Left eye: 1.0% Mydriacyl, 2.5% Phenylephrine @ 9:33 AM          IMAGING AND PROCEDURES  Imaging and Procedures for 08/05/19  Focal Laser - OS - Left Eye       Time Out Confirmed correct patient, procedure, site, and patient consented.   Anesthesia Topical anesthesia was used. Anesthetic medications included Proparacaine 0.5%.   Laser Information The type of laser was diode. Color was yellow. The duration in seconds was 0.05. The spot size was 100 microns. Laser power was 80. Total spots was 68.   Post-op The patient tolerated the procedure well. There were no complications. The patient received written and verbal post procedure care education.                 ASSESSMENT/PLAN:  No problem-specific Assessment & Plan notes found for this encounter.      ICD-10-CM   1. 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  Focal laser photocoagulation temporal to the fovea OS today no complications  2.  Patient is scheduled for follow-up examination of the right eye tomorrow and possible injection Avastin.  3.  Ophthalmic Meds Ordered this visit:  No orders of the defined types were placed in this encounter.      No follow-ups on file.  There are no Patient Instructions on file for this visit.   Explained the diagnoses, plan, and follow up with the patient and they expressed understanding.  Patient expressed understanding of the importance of proper follow up care.   Alford Highland Solstice Lastinger M.D. Diseases & Surgery of the Retina [facility] 08/05/19     Abbreviations: M myopia (nearsighted); A astigmatism; H hyperopia (farsighted); P presbyopia; Mrx spectacle prescription;  CTL contact lenses; OD right eye; OS left eye; OU both eyes  XT exotropia; ET esotropia; PEK punctate epithelial keratitis; PEE punctate epithelial erosions; DES dry eye syndrome; MGD meibomian gland dysfunction; ATs artificial tears; PFAT's preservative free artificial tears; NSC nuclear sclerotic cataract; PSC posterior subcapsular cataract; ERM epi-retinal membrane; PVD posterior vitreous detachment; RD retinal detachment; DM diabetes mellitus; DR diabetic retinopathy; NPDR non-proliferative diabetic retinopathy; PDR proliferative diabetic retinopathy; CSME clinically significant macular edema; DME diabetic macular edema; dbh dot blot hemorrhages; CWS cotton wool spot; POAG primary open angle glaucoma; C/D cup-to-disc ratio; HVF humphrey visual field; GVF goldmann visual field; OCT optical coherence tomography; IOP intraocular pressure; BRVO Branch retinal vein occlusion; CRVO central retinal vein occlusion; CRAO central retinal artery occlusion; BRAO branch retinal artery occlusion; RT retinal tear; SB scleral buckle; PPV pars plana vitrectomy; VH Vitreous hemorrhage; PRP  panretinal laser photocoagulation; IVK intravitreal kenalog; VMT vitreomacular traction; MH Macular hole;  NVD neovascularization of the disc; NVE neovascularization elsewhere; AREDS age related eye disease study; ARMD age related macular degeneration; POAG primary open angle glaucoma; EBMD epithelial/anterior basement membrane dystrophy; ACIOL anterior chamber intraocular lens; IOL intraocular lens; PCIOL posterior chamber intraocular lens; Phaco/IOL phacoemulsification with intraocular lens placement; [place] photorefractive keratectomy; LASIK laser assisted in situ keratomileusis; HTN hypertension; DM diabetes mellitus; COPD chronic obstructive pulmonary disease

## 2019-08-06 ENCOUNTER — Encounter (INDEPENDENT_AMBULATORY_CARE_PROVIDER_SITE_OTHER): Payer: Self-pay | Admitting: Ophthalmology

## 2019-08-06 ENCOUNTER — Ambulatory Visit (INDEPENDENT_AMBULATORY_CARE_PROVIDER_SITE_OTHER): Payer: Medicare Other | Admitting: Ophthalmology

## 2019-08-06 DIAGNOSIS — E113511 Type 2 diabetes mellitus with proliferative diabetic retinopathy with macular edema, right eye: Secondary | ICD-10-CM

## 2019-08-06 MED ORDER — AFLIBERCEPT 2MG/0.05ML IZ SOLN FOR KALEIDOSCOPE
2.0000 mg | INTRAVITREAL | Status: AC | PRN
  Administered 2019-08-06: 2 mg via INTRAVITREAL

## 2019-08-06 NOTE — Progress Notes (Signed)
08/06/2019     CHIEF COMPLAINT Patient presents for Retina Follow Up   HISTORY OF PRESENT ILLNESS: Henry Carroll is a 69 y.o. 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  Severity is moderate.  Duration of 5 weeks.  Since onset it is stable.  I, the attending physician,  performed the HPI with the patient and updated documentation appropriately.          Comments    5 Week NPDR f\u OD. Possible Eylea OD. OCT  Pt states no changes or issues. BGL: 130       Last edited by Tilda Franco on 08/06/2019  9:28 AM. (History)      Referring physician: [name], NP [place],  [place] [address]  HISTORICAL INFORMATION:   Selected notes from the MEDICAL RECORD NUMBER    Lab Results  Component Value Date   HGBA1C 7.5 (H) 05/20/2019     CURRENT MEDICATIONS: No current outpatient medications on file. (Ophthalmic Drugs)   No current facility-administered medications for this visit. (Ophthalmic Drugs)   Current Outpatient Medications (Other)  Medication Sig  . acetaminophen (TYLENOL) 500 MG tablet Take 1,000 mg by mouth 2 (two) times daily as needed for moderate pain.  Marland Kitchen albuterol (PROVENTIL) (2.5 MG/3ML) 0.083% nebulizer solution Use One vial in Nebulizer every 6 hours as needed for wheezing and shortness of breath. Dx: Ro6.02  . albuterol (VENTOLIN HFA) 108 (90 Base) MCG/ACT inhaler Inhale 2 puffs into the lungs every 6 (six) hours as needed for wheezing or shortness of breath.  Marland Kitchen amLODipine (NORVASC) 10 MG tablet Take 1 tablet by mouth once daily  . aspirin EC 81 MG tablet Take 1 tablet (81 mg total) by mouth daily.  Marland Kitchen atorvastatin (LIPITOR) 40 MG tablet Take 1 tablet (40 mg total) by mouth every evening.  . carvedilol (COREG) 25 MG tablet TAKE 1 TABLET BY MOUTH TWICE DAILY WITH A MEAL  . Cholecalciferol (VITAMIN D3) 50 MCG (2000 UT) TABS Take 4,000 Units  by mouth daily.  . Cinnamon 500 MG capsule Take 1,000 mg by mouth daily.   . Coenzyme Q10 (CO Q 10 PO) Take 200 mg by mouth daily.   Marland Kitchen epoetin alfa-epbx (RETACRIT) 98119 UNIT/ML injection 20,000 Units every 14 (fourteen) days.   . fluticasone (FLONASE) 50 MCG/ACT nasal spray Place 2 sprays into both nostrils daily as needed for allergies or rhinitis (SEASONAL ALLERGIES).  . Fluticasone-Salmeterol (ADVAIR) 500-50 MCG/DOSE AEPB Inhale 1 puff into the lungs 2 (two) times daily as needed (respiratory issues.).  Marland Kitchen furosemide (LASIX) 40 MG tablet Take 2 tablets by mouth twice daily  . glucose blood (ONE TOUCH ULTRA TEST) test strip Use to test blood sugar three times daily. Dx: E11.21  . hydrALAZINE (APRESOLINE) 50 MG tablet Take 50 mg by mouth 2 (two) times daily.  . insulin detemir (LEVEMIR) 100 UNIT/ML injection Inject 0.15 mLs (15 Units total) into the skin daily. In the morning  . isosorbide mononitrate (IMDUR) 30 MG 24 hr tablet Take 1 tablet by mouth once daily  . metolazone (ZAROXOLYN) 5 MG tablet Take 5 mg by mouth 2 (two) times a week.  . montelukast (SINGULAIR) 10 MG tablet TAKE 1 TABLET BY MOUTH AT BEDTIME  . Multiple Vitamin (MULTIVITAMIN WITH MINERALS) TABS tablet Take 1 tablet by mouth daily.  . nitroGLYCERIN (NITROSTAT)  0.4 MG SL tablet Place 1 tablet (0.4 mg total) under the tongue every 5 (five) minutes as needed for chest pain.  Marland Kitchen PROAIR HFA 108 (90 Base) MCG/ACT inhaler INHALE 2 PUFFS INTO LUNGS EVERY 6 HOURS AS NEEDED FOR SHORTNESS OF BREATH   No current facility-administered medications for this visit. (Other)   Facility-Administered Medications Ordered in Other Visits (Other)  Medication Route  . regadenoson (LEXISCAN) injection SOLN 0.4 mg Intravenous      REVIEW OF SYSTEMS: ROS    Positive for: Endocrine   Last edited by Tilda Franco on 08/06/2019  9:28 AM. (History)       ALLERGIES No Known Allergies  PAST MEDICAL HISTORY Past Medical History:    Diagnosis Date  . Abnormal stress test 02/10/2015  . Acute on chronic systolic and diastolic heart failure, NYHA class 1 ([place]) 12/31/2014  . Anemia   . Anemia in chronic kidney disease 09/03/2012  . 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 Asthma   . Asthma, chronic 06/04/2012  . Asthma, chronic, mild intermittent, with acute exacerbation 12/22/2015  . Bilateral lower extremity edema 12/22/2015  . CAD (coronary artery disease) 02/18/2015  . Chronic combined systolic (congestive) and diastolic (congestive) heart failure ([place])    a. 12/31/14: 2D ECHO: EF 40-45%, HK of inf myocardium, G1DD, mod MR  . Chronic combined systolic and diastolic CHF (congestive heart failure) ([place]) 02/10/2015  . Chronic systolic heart failure ([place]) 02/25/2015  . CKD (chronic kidney disease), stage III    stage 3 kidney disease  . CKD (chronic kidney disease), stage V ([place]) 08/30/2018  . COLONIC POLYPS, HX OF 04/13/2009   Qualifier: Diagnosis of  By: [place] Pam.   . Coronary artery disease    a. LHC 01/2015 - triple vessel CAD (mod oLM, mLAD, severe mRCA, intermediate branch stenosis, CTO of mCx). Plan CABG 02/2015.  Marland Kitchen Demand ischemia ([place]) 12/31/2014  . DM type 2, uncontrolled, with renal complications ([place]) 6/76/7209  . Dyspnea     due to fluid  . Elevated troponin   . Essential hypertension 10/09/2013  . Folliculitis of perineum 10/01/2012  . Gastric erosion   . GERD (gastroesophageal reflux disease)   . History of blood transfusion   . Hyperlipidemia   . Hyperlipidemia with target LDL less than 100 12/31/2014  . Hypertension   . Hypertensive heart disease with heart failure ([place]) 02/25/2015  . IDA (iron deficiency anemia) 01/23/2016  . Melena 01/23/2016  . 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 Mitral regurgitation    a. Mild-mod by echo 12/2014.  . Morbid obesity ([place]) 12/29/2014  . Myocardial infarction [facility])    pt. states per [name] he has in the past  . NSVT  (nonsustained ventricular tachycardia) ([place])    a. 9 beats during 01/2015 adm. BB titrated.  . Obesity    a. BMI 33  . Obesity (BMI 30-39.9) 05/05/2013  . OSA (obstructive sleep apnea) 01/16/2018   Mild obstructive sleep apnea overall with an AHI of 7.3/h and no significant central sleep apnea. Severe obstructive sleep apnea during REM sleep with an AHI of 34.3/h.  Now on CPAP at 6 cm H2O.   . Other malaise and fatigue 09/03/2012  . 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 PAD (peripheral artery disease) ([place]) 03/02/2015  . Pneumonia   . Sleep apnea    2019, Dr.Turner diagnoised   . Small bowel lesion   . Symptomatic anemia 08/29/2018  .  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 Type II diabetes mellitus ([place])   . Wears dentures    Past Surgical History:  Procedure Laterality Date  . BACK SURGERY    . BASCILIC VEIN TRANSPOSITION Left 11/14/2018   Procedure: FIRST STAGE BASILIC VEIN TRANSPOSITION LEFT ARM;  Surgeon: Serafina Mitchell, MD;  Location: [facility];  Service: Vascular;  Laterality: Left;  . BASCILIC VEIN TRANSPOSITION Left 01/28/2019   Procedure: BASILIC VEIN TRANSPOSITION SECOND STAGE;  Surgeon: Serafina Mitchell, MD;  Location: [facility];  Service: Vascular;  Laterality: Left;  . BONE MARROW BIOPSY    . CARDIAC CATHETERIZATION N/A 02/09/2015   Procedure: Left Heart Cath and Coronary Angiography;  Surgeon: Burnell Blanks, MD;  Location: [facility];  Service: Cardiovascular;  Laterality: N/A;  . COLONOSCOPY  2011   Dr. [name]: Ileocecal valve appeared normal, scattered pancolonic diverticulosis, difficult bowel prep making smaller lesions potentially missed. Recommended three-year follow-up colonoscopy.  . COLONOSCOPY  2003   Dr. [name]: Suspicious lesion at the ileocecal valve, multiple biopsies benign, pancolonic diverticulosis.  . COLONOSCOPY N/A 02/15/2016   diverticulosis in sigmoid and descending colon, single 5 mm polyp at splenic flexure (Tubular adenoma)  .  CORONARY ARTERY BYPASS GRAFT N/A 02/18/2015   Procedure: CORONARY ARTERY BYPASS GRAFTING (CABG) x  four, using bilateral internal mammary arteries and right leg greater saphenous vein harvested endoscopically;  Surgeon: [name], MD;  Location: [facility];  Service: Open Heart Surgery;  Laterality: N/A;  . ESOPHAGOGASTRODUODENOSCOPY N/A 02/15/2016   normal  . EYE SURGERY Right    July 2019   . GIVENS CAPSULE STUDY N/A 01/08/2017   couple of gastric and small bowel eroions in setting of aspirin 81 mg daily but nothing concerning, continue Hematology follow-up  . HERNIA REPAIR  6004   Umbilical  . LUMBAR [place] SURGERY  2006   L4 & L5  . REFRACTIVE SURGERY Left    2019   . REFRACTIVE SURGERY Right    2019  . TEE WITHOUT CARDIOVERSION N/A 02/18/2015   Procedure: TRANSESOPHAGEAL ECHOCARDIOGRAM (TEE);  Surgeon: [name], MD;  Location: [facility];  Service: Open Heart Surgery;  Laterality: N/A;  . TONSILLECTOMY  1962    FAMILY HISTORY Family History  Problem Relation Age of Onset  . Diabetes Mother   . Heart disease Mother        later in life, age >76  . Heart disease Father        heart failure later in life  . Hypertension Father   . Stroke Sister   . Kidney disease Daughter   . Sudden death Daughter   . Colon cancer Paternal Uncle        Passed from colon CA  . Heart attack Neg Hx     SOCIAL HISTORY Social History   Tobacco Use  . Smoking status: Former Smoker    Years: 17.00    Types: Cigars    Quit date: 12/31/2014    Years since quitting: 4.6  . Smokeless tobacco: Never Used  Vaping Use  . Vaping Use: Never used  Substance Use Topics  . Alcohol use: Yes    Alcohol/week: 3.0 standard drinks    Types: 3 Glasses of wine per week    Comment: occasional glass of wine.  . Drug use: No         OPHTHALMIC EXAM:  Base Eye Exam    Visual Acuity (Snellen - Linear)      Right  Left   Dist [place] 20/30 20/40 +   Dist ph [place]  20/25 -1       Tonometry (Tonopen, 9:32 AM)        Right Left   Pressure 11 10       Pupils      Dark Light Shape React APD   Right 4 3 Round Brisk None   Left 4 3 Round Brisk None       Neuro/Psych    Oriented x3: Yes   Mood/Affect: Normal       Dilation    Right eye: 1.0% Mydriacyl, 2.5% Phenylephrine @ 9:32 AM        Slit Lamp and Fundus Exam    External Exam      Right Left   External Normal Normal       Slit Lamp Exam      Right Left   Lids/Lashes Normal Normal   Conjunctiva/Sclera White and quiet White and quiet   Cornea Clear Clear   Anterior Chamber Deep and quiet Deep and quiet   Iris Round and reactive Round and reactive   Lens 3+ Nuclear sclerosis 3+ Nuclear sclerosis   Anterior Vitreous Normal Normal       Fundus Exam      Right Left   Posterior Vitreous Normal    Disc Normal    C/D Ratio 0.25    Macula Macular thickening,  Mild clinically significant macular edema, Exudates, Microaneurysms    Vessels Quiet proliferative diabetic retinopathy    Periphery Laser scars, good PRP           IMAGING AND PROCEDURES  Imaging and Procedures for 08/06/19  OCT, Retina - OU - Both Eyes       Right Eye Quality was good. Scan locations included subfoveal. Central Foveal Thickness: 305. Progression has been stable.   Left Eye Quality was good. Scan locations included subfoveal. Central Foveal Thickness: 256. Progression has improved.   Notes OD, CSME temporal to fovea.  Improved on Eylea at 5 weeks, will repeat today and consider focal laser treatment right eye to the extra foveal portions of the edema for long-term control       Intravitreal Injection, Pharmacologic Agent - OD - Right Eye       Time Out 08/06/2019. 10:20 AM. Confirmed correct patient, procedure, site, and patient consented.   Anesthesia Topical anesthesia was used. Anesthetic medications included [place] 3.5%.   Procedure Preparation included Ofloxacin , 10% betadine to eyelids. A 30 gauge needle was used.   Injection:   2 mg aflibercept Alfonse Flavors) SOLN   NDC: [id number], Lot: [id number]   Route: Intravitreal, Site: Right Eye, Waste: 0 mg  Post-op Post injection exam found visual acuity of at least counting fingers. The patient tolerated the procedure well. There were no complications. The patient received written and verbal post procedure care education. Post injection medications were not given.                 ASSESSMENT/PLAN:  No problem-specific Assessment & Plan notes found for this encounter.      ICD-10-CM   1. 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  Repeat intravitreal Eylea OD today as CSME photophobia continues to improve, will deliver  focal laser treatment to reduce treatment burden  2.  3.  Ophthalmic Meds Ordered this visit:  Meds ordered this encounter  Medications  . aflibercept (EYLEA) SOLN 2 mg       Return in about 2 weeks (around 08/20/2019) for OD, FOCAL.  There are no Patient Instructions on file for this visit.   Explained the diagnoses, plan, and follow up with the patient and they expressed understanding.  Patient expressed understanding of the importance of proper follow up care.   Clent Demark Morgann Woodburn M.D. Diseases & Surgery of the Retina and Vitreous Retina & Diabetic [place] 08/06/19     Abbreviations: M myopia (nearsighted); A astigmatism; H hyperopia (farsighted); P presbyopia; Mrx spectacle prescription;  CTL contact lenses; OD right eye; OS left eye; OU both eyes  XT exotropia; ET esotropia; PEK punctate epithelial keratitis; PEE punctate epithelial erosions; DES dry eye syndrome; MGD meibomian gland dysfunction; ATs artificial tears; PFAT's preservative free artificial tears; [place] nuclear sclerotic cataract; PSC posterior subcapsular  cataract; ERM epi-retinal membrane; PVD posterior vitreous detachment; RD retinal detachment; DM diabetes mellitus; DR diabetic retinopathy; NPDR non-proliferative diabetic retinopathy; PDR proliferative diabetic retinopathy; CSME clinically significant macular edema; DME diabetic macular edema; dbh dot blot hemorrhages; CWS cotton wool spot; POAG primary open angle glaucoma; C/D cup-to-disc ratio; HVF humphrey visual field; GVF goldmann visual field; OCT optical coherence tomography; IOP intraocular pressure; BRVO Branch retinal vein occlusion; CRVO central retinal vein occlusion; CRAO central retinal artery occlusion; BRAO branch retinal artery occlusion; RT retinal tear; SB scleral buckle; PPV pars plana vitrectomy; VH Vitreous hemorrhage; PRP panretinal laser photocoagulation; IVK intravitreal kenalog; VMT vitreomacular traction; MH Macular hole;  NVD neovascularization of the disc; NVE neovascularization elsewhere; AREDS age related eye disease study; ARMD age related macular degeneration; POAG primary open angle glaucoma; EBMD epithelial/anterior basement membrane dystrophy; ACIOL anterior chamber intraocular lens; IOL intraocular lens; PCIOL posterior chamber intraocular lens; Phaco/IOL phacoemulsification with intraocular lens placement; [place] photorefractive keratectomy; LASIK laser assisted in situ keratomileusis; HTN hypertension; DM diabetes mellitus; COPD chronic obstructive pulmonary disease

## 2019-08-07 ENCOUNTER — Other Ambulatory Visit: Payer: Self-pay | Admitting: Cardiology

## 2019-08-12 ENCOUNTER — Encounter (HOSPITAL_COMMUNITY)
Admission: RE | Admit: 2019-08-12 | Discharge: 2019-08-12 | Disposition: A | Discharge status: Home / Self Care | Payer: Medicare Other | Source: Ambulatory Visit | Attending: Nephrology | Admitting: Nephrology

## 2019-08-12 ENCOUNTER — Encounter (HOSPITAL_COMMUNITY): Payer: Self-pay

## 2019-08-12 ENCOUNTER — Other Ambulatory Visit: Payer: Self-pay

## 2019-08-12 DIAGNOSIS — N184 Chronic kidney disease, stage 4 (severe): Secondary | ICD-10-CM | POA: Diagnosis not present

## 2019-08-12 LAB — RENAL FUNCTION PANEL
Albumin: 3.5 g/dL (ref 3.5–5.0)
Anion gap: 14 (ref 5–15)
BUN: 138 mg/dL — ABNORMAL HIGH (ref 8–23)
CO2: 21 mmol/L — ABNORMAL LOW (ref 22–32)
Calcium: 8.9 mg/dL (ref 8.9–10.3)
Chloride: 100 mmol/L (ref 98–111)
Creatinine, Ser: 7.05 mg/dL — ABNORMAL HIGH (ref 0.61–1.24)
GFR calc Af Amer: 8 mL/min — ABNORMAL LOW (ref >60)
GFR calc non Af Amer: 7 mL/min — ABNORMAL LOW (ref >60)
Glucose, Bld: 249 mg/dL — ABNORMAL HIGH (ref 70–99)
Phosphorus: 5.2 mg/dL — ABNORMAL HIGH (ref 2.5–4.6)
Potassium: 3.9 mmol/L (ref 3.5–5.1)
Sodium: 135 mmol/L (ref 135–145)

## 2019-08-12 LAB — MAGNESIUM: Magnesium: 2 mg/dL (ref 1.7–2.4)

## 2019-08-12 LAB — CBC WITH DIFFERENTIAL/PLATELET
Abs Immature Granulocytes: 0.01 10*3/uL (ref 0.00–0.07)
Basophils Absolute: 0 10*3/uL (ref 0.0–0.1)
Basophils Relative: 1 %
Eosinophils Absolute: 0.3 10*3/uL (ref 0.0–0.5)
Eosinophils Relative: 5 %
HCT: 29.9 % — ABNORMAL LOW (ref 39.0–52.0)
Hemoglobin: 9.8 g/dL — ABNORMAL LOW (ref 13.0–17.0)
Immature Granulocytes: 0 %
Lymphocytes Relative: 12 %
Lymphs Abs: 0.6 10*3/uL — ABNORMAL LOW (ref 0.7–4.0)
MCH: 32.5 pg (ref 26.0–34.0)
MCHC: 32.8 g/dL (ref 30.0–36.0)
MCV: 99 fL (ref 80.0–100.0)
Monocytes Absolute: 0.6 10*3/uL (ref 0.1–1.0)
Monocytes Relative: 11 %
Neutro Abs: 3.6 10*3/uL (ref 1.7–7.7)
Neutrophils Relative %: 71 %
Platelets: 173 10*3/uL (ref 150–400)
RBC: 3.02 MIL/uL — ABNORMAL LOW (ref 4.22–5.81)
RDW: 14.3 % (ref 11.5–15.5)
WBC: 5.1 10*3/uL (ref 4.0–10.5)
nRBC: 0 % (ref 0.0–0.2)

## 2019-08-12 LAB — POCT HEMOGLOBIN-HEMACUE: Hemoglobin: 9.6 g/dL — ABNORMAL LOW (ref 13.0–17.0)

## 2019-08-12 LAB — IRON AND TIBC
Iron: 36 ug/dL — ABNORMAL LOW (ref 45–182)
Saturation Ratios: 13 % — ABNORMAL LOW (ref 17.9–39.5)
TIBC: 284 ug/dL (ref 250–450)
UIBC: 248 ug/dL

## 2019-08-12 MED ORDER — EPOETIN ALFA-EPBX 10000 UNIT/ML IJ SOLN
INTRAMUSCULAR | Status: AC
  Filled 2019-08-12: qty 2

## 2019-08-12 MED ORDER — EPOETIN ALFA-EPBX 10000 UNIT/ML IJ SOLN
20000.0000 [IU] | Freq: Once | INTRAMUSCULAR | Status: AC
  Administered 2019-08-12: 20000 [IU] via SUBCUTANEOUS

## 2019-08-20 ENCOUNTER — Encounter (INDEPENDENT_AMBULATORY_CARE_PROVIDER_SITE_OTHER): Payer: Medicare Other | Admitting: Ophthalmology

## 2019-08-26 ENCOUNTER — Encounter (HOSPITAL_COMMUNITY)
Admission: RE | Admit: 2019-08-26 | Discharge: 2019-08-26 | Disposition: A | Discharge status: Home / Self Care | Payer: Medicare Other | Source: Ambulatory Visit | Attending: Nephrology | Admitting: Nephrology

## 2019-08-26 ENCOUNTER — Other Ambulatory Visit: Payer: Self-pay

## 2019-08-26 ENCOUNTER — Encounter (HOSPITAL_COMMUNITY): Payer: Self-pay

## 2019-08-26 DIAGNOSIS — D638 Anemia in other chronic diseases classified elsewhere: Secondary | ICD-10-CM | POA: Insufficient documentation

## 2019-08-26 DIAGNOSIS — N189 Chronic kidney disease, unspecified: Secondary | ICD-10-CM | POA: Insufficient documentation

## 2019-08-26 MED ORDER — EPOETIN ALFA-EPBX 10000 UNIT/ML IJ SOLN
20000.0000 [IU] | Freq: Once | INTRAMUSCULAR | Status: AC
  Administered 2019-08-26: 20000 [IU] via SUBCUTANEOUS

## 2019-08-26 MED ORDER — EPOETIN ALFA-EPBX 10000 UNIT/ML IJ SOLN
INTRAMUSCULAR | Status: AC
  Filled 2019-08-26: qty 2

## 2019-08-28 LAB — POCT HEMOGLOBIN-HEMACUE: Hemoglobin: 10 g/dL — ABNORMAL LOW (ref 13.0–17.0)

## 2019-09-01 DIAGNOSIS — Z992 Dependence on renal dialysis: Secondary | ICD-10-CM | POA: Diagnosis not present

## 2019-09-01 DIAGNOSIS — N186 End stage renal disease: Secondary | ICD-10-CM | POA: Diagnosis not present

## 2019-09-01 DIAGNOSIS — E1122 Type 2 diabetes mellitus with diabetic chronic kidney disease: Secondary | ICD-10-CM | POA: Diagnosis not present

## 2019-09-01 DIAGNOSIS — N2581 Secondary hyperparathyroidism of renal origin: Secondary | ICD-10-CM | POA: Diagnosis not present

## 2019-09-01 DIAGNOSIS — D509 Iron deficiency anemia, unspecified: Secondary | ICD-10-CM | POA: Diagnosis not present

## 2019-09-01 DIAGNOSIS — E1129 Type 2 diabetes mellitus with other diabetic kidney complication: Secondary | ICD-10-CM | POA: Diagnosis not present

## 2019-09-01 DIAGNOSIS — D472 Monoclonal gammopathy: Secondary | ICD-10-CM | POA: Diagnosis not present

## 2019-09-01 DIAGNOSIS — Z23 Encounter for immunization: Secondary | ICD-10-CM | POA: Diagnosis not present

## 2019-09-03 DIAGNOSIS — Z992 Dependence on renal dialysis: Secondary | ICD-10-CM | POA: Diagnosis not present

## 2019-09-03 DIAGNOSIS — N186 End stage renal disease: Secondary | ICD-10-CM | POA: Diagnosis not present

## 2019-09-03 DIAGNOSIS — E1129 Type 2 diabetes mellitus with other diabetic kidney complication: Secondary | ICD-10-CM | POA: Diagnosis not present

## 2019-09-03 DIAGNOSIS — D509 Iron deficiency anemia, unspecified: Secondary | ICD-10-CM | POA: Diagnosis not present

## 2019-09-03 DIAGNOSIS — D472 Monoclonal gammopathy: Secondary | ICD-10-CM | POA: Diagnosis not present

## 2019-09-03 DIAGNOSIS — N2581 Secondary hyperparathyroidism of renal origin: Secondary | ICD-10-CM | POA: Diagnosis not present

## 2019-09-05 DIAGNOSIS — Z992 Dependence on renal dialysis: Secondary | ICD-10-CM | POA: Diagnosis not present

## 2019-09-05 DIAGNOSIS — N186 End stage renal disease: Secondary | ICD-10-CM | POA: Diagnosis not present

## 2019-09-05 DIAGNOSIS — D472 Monoclonal gammopathy: Secondary | ICD-10-CM | POA: Diagnosis not present

## 2019-09-05 DIAGNOSIS — E1129 Type 2 diabetes mellitus with other diabetic kidney complication: Secondary | ICD-10-CM | POA: Diagnosis not present

## 2019-09-05 DIAGNOSIS — N2581 Secondary hyperparathyroidism of renal origin: Secondary | ICD-10-CM | POA: Diagnosis not present

## 2019-09-05 DIAGNOSIS — D509 Iron deficiency anemia, unspecified: Secondary | ICD-10-CM | POA: Diagnosis not present

## 2019-09-08 DIAGNOSIS — D472 Monoclonal gammopathy: Secondary | ICD-10-CM | POA: Diagnosis not present

## 2019-09-08 DIAGNOSIS — N2581 Secondary hyperparathyroidism of renal origin: Secondary | ICD-10-CM | POA: Diagnosis not present

## 2019-09-08 DIAGNOSIS — Z992 Dependence on renal dialysis: Secondary | ICD-10-CM | POA: Diagnosis not present

## 2019-09-08 DIAGNOSIS — D509 Iron deficiency anemia, unspecified: Secondary | ICD-10-CM | POA: Diagnosis not present

## 2019-09-08 DIAGNOSIS — E1129 Type 2 diabetes mellitus with other diabetic kidney complication: Secondary | ICD-10-CM | POA: Diagnosis not present

## 2019-09-08 DIAGNOSIS — N186 End stage renal disease: Secondary | ICD-10-CM | POA: Diagnosis not present

## 2019-09-09 ENCOUNTER — Ambulatory Visit (HOSPITAL_COMMUNITY): Payer: Medicare Other

## 2019-09-09 ENCOUNTER — Other Ambulatory Visit (HOSPITAL_COMMUNITY): Payer: Medicare Other

## 2019-09-10 DIAGNOSIS — Z992 Dependence on renal dialysis: Secondary | ICD-10-CM | POA: Diagnosis not present

## 2019-09-10 DIAGNOSIS — E1129 Type 2 diabetes mellitus with other diabetic kidney complication: Secondary | ICD-10-CM | POA: Diagnosis not present

## 2019-09-10 DIAGNOSIS — N186 End stage renal disease: Secondary | ICD-10-CM | POA: Diagnosis not present

## 2019-09-10 DIAGNOSIS — N2581 Secondary hyperparathyroidism of renal origin: Secondary | ICD-10-CM | POA: Diagnosis not present

## 2019-09-10 DIAGNOSIS — D472 Monoclonal gammopathy: Secondary | ICD-10-CM | POA: Diagnosis not present

## 2019-09-10 DIAGNOSIS — D509 Iron deficiency anemia, unspecified: Secondary | ICD-10-CM | POA: Diagnosis not present

## 2019-09-12 DIAGNOSIS — D472 Monoclonal gammopathy: Secondary | ICD-10-CM | POA: Diagnosis not present

## 2019-09-12 DIAGNOSIS — D509 Iron deficiency anemia, unspecified: Secondary | ICD-10-CM | POA: Diagnosis not present

## 2019-09-12 DIAGNOSIS — N186 End stage renal disease: Secondary | ICD-10-CM | POA: Diagnosis not present

## 2019-09-12 DIAGNOSIS — Z992 Dependence on renal dialysis: Secondary | ICD-10-CM | POA: Diagnosis not present

## 2019-09-12 DIAGNOSIS — N2581 Secondary hyperparathyroidism of renal origin: Secondary | ICD-10-CM | POA: Diagnosis not present

## 2019-09-12 DIAGNOSIS — E1129 Type 2 diabetes mellitus with other diabetic kidney complication: Secondary | ICD-10-CM | POA: Diagnosis not present

## 2019-09-13 DIAGNOSIS — N186 End stage renal disease: Secondary | ICD-10-CM | POA: Diagnosis not present

## 2019-09-13 DIAGNOSIS — E1122 Type 2 diabetes mellitus with diabetic chronic kidney disease: Secondary | ICD-10-CM | POA: Diagnosis not present

## 2019-09-13 DIAGNOSIS — Z992 Dependence on renal dialysis: Secondary | ICD-10-CM | POA: Diagnosis not present

## 2019-09-15 DIAGNOSIS — N186 End stage renal disease: Secondary | ICD-10-CM | POA: Diagnosis not present

## 2019-09-15 DIAGNOSIS — Z23 Encounter for immunization: Secondary | ICD-10-CM | POA: Diagnosis not present

## 2019-09-15 DIAGNOSIS — N2581 Secondary hyperparathyroidism of renal origin: Secondary | ICD-10-CM | POA: Diagnosis not present

## 2019-09-15 DIAGNOSIS — E1129 Type 2 diabetes mellitus with other diabetic kidney complication: Secondary | ICD-10-CM | POA: Diagnosis not present

## 2019-09-15 DIAGNOSIS — D509 Iron deficiency anemia, unspecified: Secondary | ICD-10-CM | POA: Diagnosis not present

## 2019-09-15 DIAGNOSIS — D472 Monoclonal gammopathy: Secondary | ICD-10-CM | POA: Diagnosis not present

## 2019-09-15 DIAGNOSIS — Z992 Dependence on renal dialysis: Secondary | ICD-10-CM | POA: Diagnosis not present

## 2019-09-17 ENCOUNTER — Other Ambulatory Visit (HOSPITAL_COMMUNITY): Payer: Medicare Other

## 2019-09-17 DIAGNOSIS — N186 End stage renal disease: Secondary | ICD-10-CM | POA: Diagnosis not present

## 2019-09-17 DIAGNOSIS — E1129 Type 2 diabetes mellitus with other diabetic kidney complication: Secondary | ICD-10-CM | POA: Diagnosis not present

## 2019-09-17 DIAGNOSIS — D509 Iron deficiency anemia, unspecified: Secondary | ICD-10-CM | POA: Diagnosis not present

## 2019-09-17 DIAGNOSIS — D472 Monoclonal gammopathy: Secondary | ICD-10-CM | POA: Diagnosis not present

## 2019-09-17 DIAGNOSIS — N2581 Secondary hyperparathyroidism of renal origin: Secondary | ICD-10-CM | POA: Diagnosis not present

## 2019-09-17 DIAGNOSIS — Z992 Dependence on renal dialysis: Secondary | ICD-10-CM | POA: Diagnosis not present

## 2019-09-19 ENCOUNTER — Other Ambulatory Visit: Payer: Self-pay | Admitting: Nurse Practitioner

## 2019-09-19 DIAGNOSIS — E1129 Type 2 diabetes mellitus with other diabetic kidney complication: Secondary | ICD-10-CM | POA: Diagnosis not present

## 2019-09-19 DIAGNOSIS — Z992 Dependence on renal dialysis: Secondary | ICD-10-CM | POA: Diagnosis not present

## 2019-09-19 DIAGNOSIS — D509 Iron deficiency anemia, unspecified: Secondary | ICD-10-CM | POA: Diagnosis not present

## 2019-09-19 DIAGNOSIS — D472 Monoclonal gammopathy: Secondary | ICD-10-CM | POA: Diagnosis not present

## 2019-09-19 DIAGNOSIS — N186 End stage renal disease: Secondary | ICD-10-CM | POA: Diagnosis not present

## 2019-09-19 DIAGNOSIS — N2581 Secondary hyperparathyroidism of renal origin: Secondary | ICD-10-CM | POA: Diagnosis not present

## 2019-09-21 NOTE — Telephone Encounter (Signed)
Received request from Pharmacy Pended Rx and sent to [facility] for approval due to [place].

## 2019-09-22 DIAGNOSIS — N186 End stage renal disease: Secondary | ICD-10-CM | POA: Diagnosis not present

## 2019-09-22 DIAGNOSIS — Z992 Dependence on renal dialysis: Secondary | ICD-10-CM | POA: Diagnosis not present

## 2019-09-22 DIAGNOSIS — D509 Iron deficiency anemia, unspecified: Secondary | ICD-10-CM | POA: Diagnosis not present

## 2019-09-22 DIAGNOSIS — D472 Monoclonal gammopathy: Secondary | ICD-10-CM | POA: Diagnosis not present

## 2019-09-22 DIAGNOSIS — N2581 Secondary hyperparathyroidism of renal origin: Secondary | ICD-10-CM | POA: Diagnosis not present

## 2019-09-22 DIAGNOSIS — E1129 Type 2 diabetes mellitus with other diabetic kidney complication: Secondary | ICD-10-CM | POA: Diagnosis not present

## 2019-09-24 ENCOUNTER — Ambulatory Visit (HOSPITAL_COMMUNITY): Payer: Medicare Other | Admitting: Hematology

## 2019-09-24 DIAGNOSIS — E1129 Type 2 diabetes mellitus with other diabetic kidney complication: Secondary | ICD-10-CM | POA: Diagnosis not present

## 2019-09-24 DIAGNOSIS — D509 Iron deficiency anemia, unspecified: Secondary | ICD-10-CM | POA: Diagnosis not present

## 2019-09-24 DIAGNOSIS — N2581 Secondary hyperparathyroidism of renal origin: Secondary | ICD-10-CM | POA: Diagnosis not present

## 2019-09-24 DIAGNOSIS — N186 End stage renal disease: Secondary | ICD-10-CM | POA: Diagnosis not present

## 2019-09-24 DIAGNOSIS — Z992 Dependence on renal dialysis: Secondary | ICD-10-CM | POA: Diagnosis not present

## 2019-09-24 DIAGNOSIS — D472 Monoclonal gammopathy: Secondary | ICD-10-CM | POA: Diagnosis not present

## 2019-09-25 DIAGNOSIS — N186 End stage renal disease: Secondary | ICD-10-CM | POA: Diagnosis not present

## 2019-09-25 DIAGNOSIS — T82898A Other specified complication of vascular prosthetic devices, implants and grafts, initial encounter: Secondary | ICD-10-CM | POA: Diagnosis not present

## 2019-09-25 DIAGNOSIS — I871 Compression of vein: Secondary | ICD-10-CM | POA: Diagnosis not present

## 2019-09-25 DIAGNOSIS — Z992 Dependence on renal dialysis: Secondary | ICD-10-CM | POA: Diagnosis not present

## 2019-09-26 DIAGNOSIS — Z992 Dependence on renal dialysis: Secondary | ICD-10-CM | POA: Diagnosis not present

## 2019-09-26 DIAGNOSIS — N2581 Secondary hyperparathyroidism of renal origin: Secondary | ICD-10-CM | POA: Diagnosis not present

## 2019-09-26 DIAGNOSIS — N186 End stage renal disease: Secondary | ICD-10-CM | POA: Diagnosis not present

## 2019-09-26 DIAGNOSIS — D509 Iron deficiency anemia, unspecified: Secondary | ICD-10-CM | POA: Diagnosis not present

## 2019-09-26 DIAGNOSIS — E1129 Type 2 diabetes mellitus with other diabetic kidney complication: Secondary | ICD-10-CM | POA: Diagnosis not present

## 2019-09-26 DIAGNOSIS — D472 Monoclonal gammopathy: Secondary | ICD-10-CM | POA: Diagnosis not present

## 2019-09-28 NOTE — Progress Notes (Signed)
CARDIOLOGY OFFICE NOTE  Date:  10/05/2019    Henry Carroll Date of Birth: [birth year] Medical Record #[id number]  PCP:  [name], NP  Cardiologist:  [name]  Chief Complaint  Patient presents with  . Follow-up    Seen for [name]    History of Present Illness: Henry Carroll is a 69 y.o. male who presents today for a follow up visit. Seen for [name].   He has a history of known CAD with prior CABG in 2017 [place] to LAD, free right internal mammary graft to ramus, SVG to OM, SVG to PDA. Other issues includediabetes, hypertension, hyperlipidemia, stage IV-V CKD (Dr. 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   Last seen here by [name] in November. Felt to be stable.   Comes in today. Here alone. He started dialysis about a month ago - Dr. Posey Pronto is managing. Says he was so ready to have started. Feels better now that this has been started. He was pretty miserable prior to starting - could not breath - couldn't sleep. This is all better now. No chest pain. He takes his medicines at night. Says his BP is lower at dialysis. Has had several medicine changes in regards to his BP. Seem a little unclear to me. No chest pain - he can actually walk on his treadmill now. He has been vaccinated for COVID 19. He feels like he is doing ok.   Past Medical History:  Diagnosis Date  . Abnormal stress test 02/10/2015  . Acute on chronic systolic and diastolic heart failure, NYHA class 1 ([place]) 12/31/2014  . Anemia   . Anemia in chronic kidney disease 09/03/2012  . 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 Asthma   . Asthma, chronic 06/04/2012  . Asthma, chronic, mild intermittent, with acute exacerbation 12/22/2015  . Bilateral lower extremity edema 12/22/2015  . CAD (coronary artery disease) 02/18/2015  . Chronic combined systolic (congestive) and diastolic (congestive)  heart failure ([place])    a. 12/31/14: 2D ECHO: EF 40-45%, HK of inf myocardium, G1DD, mod MR  . Chronic combined systolic and diastolic CHF (congestive heart failure) ([place]) 02/10/2015  . Chronic systolic heart failure ([place]) 02/25/2015  . CKD (chronic kidney disease), stage III    stage 3 kidney disease  . CKD (chronic kidney disease), stage V ([place]) 08/30/2018  . COLONIC POLYPS, HX OF 04/13/2009   Qualifier: Diagnosis of  By: Westly Pam.   . Coronary artery disease    a. LHC 01/2015 - triple vessel CAD (mod oLM, mLAD, severe mRCA, intermediate branch stenosis, CTO of mCx). Plan CABG 02/2015.  Marland Kitchen Demand ischemia ([place]) 12/31/2014  . DM type 2, uncontrolled, with renal complications ([place]) 6/83/4196  . Dyspnea     due to fluid  . Elevated troponin   . Essential hypertension 10/09/2013  . Folliculitis of perineum 10/01/2012  . Gastric erosion   . GERD (gastroesophageal reflux disease)   . History of blood transfusion   . Hyperlipidemia   . Hyperlipidemia with target LDL less than 100 12/31/2014  . Hypertension   . Hypertensive heart disease with heart failure ([place]) 02/25/2015  . IDA (iron deficiency anemia) 01/23/2016  . Melena 01/23/2016  . 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 Mitral regurgitation    a. Mild-mod by echo 12/2014.  . Morbid obesity ([place]) 12/29/2014  .  Myocardial infarction [facility])    pt. states per Dr. Laneta Simmers he has in the past  . NSVT (nonsustained ventricular tachycardia) (H[facility])    a. 9 beats during 01/2015 adm. BB titrated.  . Obesity    a. BMI 33  . Obesity (BMI 30-39.9) 05/05/2013  . OSA (obstructive sleep apnea) 01/16/2018   Mild obstructive sleep apnea overall with an AHI of 7.3/h and no significant central sleep apnea. Severe obstructive sleep apnea during REM sleep with an AHI of 34.3/h.  Now on CPAP at 6 cm H2O.   . Other malaise and fatigue 09/03/2012  . 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 PAD (peripheral artery disease)  (HCC) 03/02/2015  . Pneumonia   . Sleep apnea    2019, Dr.Turner diagnoised   . Small bowel lesion   . Symptomatic anemia 08/29/2018  . 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 Type II diabetes mellitus (HCC)   . Wears dentures     Past Surgical History:  Procedure Laterality Date  . BACK SURGERY    . BASCILIC VEIN TRANSPOSITION Left 11/14/2018   Procedure: FIRST STAGE BASILIC VEIN TRANSPOSITION LEFT ARM;  Surgeon: Nada Libman, MD;  Location: [facility] OR;  Service: Vascular;  Laterality: Left;  . BASCILIC VEIN TRANSPOSITION Left 01/28/2019   Procedure: BASILIC VEIN TRANSPOSITION SECOND STAGE;  Surgeon: Nada Libman, MD;  Location: [facility] OR;  Service: Vascular;  Laterality: Left;  . BONE MARROW BIOPSY    . CARDIAC CATHETERIZATION N/A 02/09/2015   Procedure: Left Heart Cath and Coronary Angiography;  Surgeon: Kathleene Hazel, MD;  Location[facility] INVASIVE CV LAB;  Service: Cardiovascular;  Laterality: N/A;  . COLONOSCOPY  2011   Dr. Jena Gauss: Ileocecal valve appeared normal, scattered pancolonic diverticulosis, difficult bowel prep making smaller lesions potentially missed. Recommended three-year follow-up colonoscopy.  . COLONOSCOPY  2003   Dr. Jena Gauss: Suspicious lesion at the ileocecal valve, multiple biopsies benign, pancolonic diverticulosis.  . COLONOSCOPY N/A 02/15/2016   diverticulosis in sigmoid and descending colon, single 5 mm polyp at splenic flexure (Tubular adenoma)  . CORONARY ARTERY BYPASS GRAFT N/A 02/18/2015   Procedure: CORONARY ARTERY BYPASS GRAFTING (CABG) x  four, using bilateral internal mammary arteries and right leg greater saphenous vein harvested endoscopically;  Surgeon: Alleen Borne, MD;  Location: [facility] OR;  Service: Open Heart Surgery;  Laterality: N/A;  . ESOPHAGOGASTRODUODENOSCOPY N/A 02/15/2016   normal  . EYE SURGERY Right    July 2019   . GIVENS CAPSULE STUDY N/A 01/08/2017   couple of gastric and small bowel eroions in setting of aspirin 81 mg daily but  nothing concerning, continue Hematology follow-up  . HERNIA REPAIR  1990   Umbilical  . LUMBAR DISC SURGERY  2006   L4 & L5  . REFRACTIVE SURGERY Left    2019   . REFRACTIVE SURGERY Right    2019  . TEE WITHOUT CARDIOVERSION N/A 02/18/2015   Procedure: TRANSESOPHAGEAL ECHOCARDIOGRAM (TEE);  Surgeon: Alleen Borne, MD;  [facility];  Service: Open Heart Surgery;  Laterality: N/A;  . TONSILLECTOMY  1962     Medications: Current Meds  Medication Sig  . acetaminophen (TYLENOL) 500 MG tablet Take 1,000 mg by mouth 2 (two) times daily as needed for moderate pain.  Marland Kitchen albuterol (PROVENTIL) (2.5 MG/3ML) 0.083% nebulizer solution Use One vial in Nebulizer every 6 hours as needed for wheezing and shortness of breath. Dx: Ro6.02  . albuterol (VENTOLIN HFA) 108 (90 Base) MCG/ACT inhaler Inhale 2 puffs into the lungs  every 6 (six) hours as needed for wheezing or shortness of breath.  Marland Kitchen amLODipine (NORVASC) 10 MG tablet Take 1 tablet by mouth once daily  . aspirin EC 81 MG tablet Take 1 tablet (81 mg total) by mouth daily.  Marland Kitchen atorvastatin (LIPITOR) 40 MG tablet Take 1 tablet (40 mg total) by mouth every evening.  . carvedilol (COREG) 25 MG tablet TAKE 1 TABLET BY MOUTH TWICE DAILY WITH A MEAL  . Cholecalciferol (VITAMIN D3) 50 MCG (2000 UT) TABS Take 4,000 Units by mouth daily.  . Cinnamon 500 MG capsule Take 1,000 mg by mouth daily.   . Coenzyme Q10 (CO Q 10 PO) Take 200 mg by mouth daily.   Marland Kitchen epoetin alfa-epbx (RETACRIT) 67462 UNIT/ML injection 20,000 Units every 14 (fourteen) days.   . fluticasone (FLONASE) 50 MCG/ACT nasal spray Place 2 sprays into both nostrils daily as needed for allergies or rhinitis (SEASONAL ALLERGIES).  . Fluticasone-Salmeterol (ADVAIR) 500-50 MCG/DOSE AEPB Inhale 1 puff into the lungs 2 (two) times daily as needed (respiratory issues.).  Marland Kitchen furosemide (LASIX) 40 MG tablet Take 2 tablets by mouth twice daily (Patient taking differently: Take 40 mg by mouth daily. Taking  only on non dialysis days)  . glucose blood (ONE TOUCH ULTRA TEST) test strip Use to test blood sugar three times daily. Dx: E11.21  . hydrALAZINE (APRESOLINE) 50 MG tablet Take 25 mg by mouth 2 (two) times daily. Taking just one tablet a day - 25 mg  . insulin detemir (LEVEMIR) 100 UNIT/ML injection Inject 0.15 mLs (15 Units total) into the skin daily. In the morning  . isosorbide mononitrate (IMDUR) 30 MG 24 hr tablet Take 1 tablet by mouth once daily  . montelukast (SINGULAIR) 10 MG tablet TAKE 1 TABLET BY MOUTH AT BEDTIME  . Multiple Vitamin (MULTIVITAMIN WITH MINERALS) TABS tablet Take 1 tablet by mouth daily.  . nitroGLYCERIN (NITROSTAT) 0.4 MG SL tablet Place 1 tablet (0.4 mg total) under the tongue every 5 (five) minutes as needed for chest pain.  Marland Kitchen PROAIR HFA 108 (90 Base) MCG/ACT inhaler INHALE 2 PUFFS BY MOUTH EVERY 6 HOURS AS NEEDED FOR SHORTNESS OF BREATH  . [DISCONTINUED] metolazone (ZAROXOLYN) 5 MG tablet Take 5 mg by mouth 2 (two) times a week.     Allergies: No Known Allergies  Social History: The patient  reports that he quit smoking about 4 years ago. His smoking use included cigars. He quit after 17.00 years of use. He has never used smokeless tobacco. He reports current alcohol use of about 3.0 standard drinks of alcohol per week. He reports that he does not use drugs.   Family History: The patient's family history includes Colon cancer in his paternal uncle; Diabetes in his mother; Heart disease in his father and mother; Hypertension in his father; Kidney disease in his daughter; Stroke in his sister; Sudden death in his daughter.   Review of Systems: Please see the history of present illness.   All other systems are reviewed and negative.   Physical Exam: VS:  BP (!) 164/56   Ht 6\' 2"  (1.88 m)   Wt 198 lb (89.8 kg)   BMI 25.42 kg/m  .  BMI Body mass index is 25.42 kg/m.  Wt Readings from Last 3 Encounters:  10/05/19 198 lb (89.8 kg)  08/26/19 203 lb (92.1 kg)   07/15/19 203 lb (92.1 kg)   BP recheck by me is 144/70.  General: Alert and in no acute distress.  He weighed 220  when here back in November. Weight is now down considerably.  Cardiac: Regular rate and rhythm. No murmurs, rubs, or gallops. No edema.  Respiratory:  Lungs are fairly clear to auscultation bilaterally with normal work of breathing.  GI: Soft and nontender.  MS: No deformity or atrophy. Gait and ROM intact.  Skin: Warm and dry. Color is normal.  Neuro:  Strength and sensation are intact and no gross focal deficits noted.  Psych: Alert, appropriate and with normal affect.   LABORATORY DATA:  EKG:  EKG is ordered today.  Personally reviewed by me. This demonstrates underlying artifact - sinus - short PR noted - HR is 83.  Lab Results  Component Value Date   WBC 5.1 08/12/2019   HGB 10.0 (L) 08/26/2019   HCT 29.9 (L) 08/12/2019   PLT 173 08/12/2019   GLUCOSE 249 (H) 08/12/2019   CHOL 114 01/07/2019   TRIG 132 01/07/2019   HDL 30 (L) 01/07/2019   LDLCALC 62 01/07/2019   ALT 13 02/10/2019   AST 21 02/10/2019   NA 135 08/12/2019   K 3.9 08/12/2019   CL 100 08/12/2019   CREATININE 7.05 (H) 08/12/2019   BUN 138 (H) 08/12/2019   CO2 21 (L) 08/12/2019   TSH 0.94 12/19/2015   PSA 0.8 12/19/2015   INR 1.1 11/20/2018   HGBA1C 7.5 (H) 05/20/2019   MICROALBUR 271.8 01/10/2017     BNP (last 3 results) Recent Labs    10/23/18 1617  BNP 2,625.6*    ProBNP (last 3 results) No results for input(s): PROBNP in the last 8760 hours.   Other Studies Reviewed Today:  ECHO IMPRESSIONS 10/2018  1. Left ventricular ejection fraction, by visual estimation, is 40 to  45%. The left ventricle has mild to moderately decreased function. Normal  left ventricular size. There is mildly increased left ventricular  hypertrophy. Severe hypokinesis of the  basal to mid inferior and inferolateral walls.  2. The mitral valve is normal in structure. Trace mitral valve    regurgitation. No evidence of mitral stenosis.  3. The tricuspid valve is normal in structure. Tricuspid valve  regurgitation is trivial.  4. The aortic valve is tricuspid Aortic valve regurgitation was not  visualized by color flow Doppler. Mild aortic valve sclerosis without  stenosis.  5. 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  6. Left atrial size was mildly dilated.  7. Left ventricular diastolic Doppler parameters are consistent with  pseudonormalization pattern of LV diastolic filling.  8. Right atrial size was mildly dilated.  9. Global right ventricle has mildly reduced systolic function.The right  ventricular size is mildly enlarged. No increase in right ventricular wall  thickness.  10. The inferior vena cava is normal in size with greater than 50%  respiratory variability, suggesting right atrial pressure of 3 mmHg.  11. Trivial pericardial effusion is present.  12. The tricuspid regurgitant velocity is 2.90 m/s, and with an assumed  right atrial pressure of 3 mmHg, the estimated right ventricular systolic  pressure is mildly elevated at 36.6 mmHg.    MyoviewStudy Highlights9/2018    The left ventricular ejection fraction is moderately decreased (30-44%).  Nuclear stress EF: 40%.  There was no ST segment deviation noted during stress.  Findings consistent with prior myocardial infarction with peri-infarct ischemia.  This is an intermediate risk study.  1. EF 40% with inferolateral hypokinesis.  2. Primarily fixed medium-sized, moderate intensity basal to mid inferolateral and basal inferior perfusion defect. This  is suggestive of prior infarction with mild peri-infarct ischemia.   Intermediate risk study primarily due to low EF.       ASSESSMENT & PLAN:    1. CAD with remote CABG - no worrisome symptoms. Remains on long acting nitrate therapy - would favor continuing. Last Myoview in 2018 was stable.   2. Chronic  systolic and diastolic HF - EF of 40 to 45% per last echo -  now volume status is managed thru dialysis - no longer on Zaroxolyn and on less Lasix per his report.   3. CKD - stage V - now on dialysis for the past month.   4. HTN - BP up some here today - reports lower readings at dialysis - recheck by me is better - suspect this will improve with dialysis tomorrow. Renal is handling his medicines. No changes were made by me today.   5. HLD - remains on high dose statin - needs labs today.   6. Anemia - per Renal - he reports he is now on iron.   7. DM - per PCP  8. OSA - followed by [name] - not discussed.   Current medicines are reviewed with the patient today.  The patient does not have concerns regarding medicines other than what has been noted above.  The following changes have been made:  See above.  Labs/ tests ordered today include:    Orders Placed This Encounter  Procedures  . Basic metabolic panel  . CBC with Differential/Platelet  . Hepatic function panel  . Lipid panel  . EKG 12-Lead     Disposition:   FU with [name] in 4 months.    Patient is agreeable to this plan and will call if any problems develop in the interim.   SignedTruitt Merle, NP  10/05/2019 9:12 AM  [facility] [address] [place], [place]  [address] Phone: [phone] Fax: [phone]

## 2019-09-29 DIAGNOSIS — N2581 Secondary hyperparathyroidism of renal origin: Secondary | ICD-10-CM | POA: Diagnosis not present

## 2019-09-29 DIAGNOSIS — D472 Monoclonal gammopathy: Secondary | ICD-10-CM | POA: Diagnosis not present

## 2019-09-29 DIAGNOSIS — E1129 Type 2 diabetes mellitus with other diabetic kidney complication: Secondary | ICD-10-CM | POA: Diagnosis not present

## 2019-09-29 DIAGNOSIS — Z992 Dependence on renal dialysis: Secondary | ICD-10-CM | POA: Diagnosis not present

## 2019-09-29 DIAGNOSIS — N186 End stage renal disease: Secondary | ICD-10-CM | POA: Diagnosis not present

## 2019-09-29 DIAGNOSIS — D509 Iron deficiency anemia, unspecified: Secondary | ICD-10-CM | POA: Diagnosis not present

## 2019-10-01 DIAGNOSIS — D472 Monoclonal gammopathy: Secondary | ICD-10-CM | POA: Diagnosis not present

## 2019-10-01 DIAGNOSIS — E1129 Type 2 diabetes mellitus with other diabetic kidney complication: Secondary | ICD-10-CM | POA: Diagnosis not present

## 2019-10-01 DIAGNOSIS — N2581 Secondary hyperparathyroidism of renal origin: Secondary | ICD-10-CM | POA: Diagnosis not present

## 2019-10-01 DIAGNOSIS — D509 Iron deficiency anemia, unspecified: Secondary | ICD-10-CM | POA: Diagnosis not present

## 2019-10-01 DIAGNOSIS — N186 End stage renal disease: Secondary | ICD-10-CM | POA: Diagnosis not present

## 2019-10-01 DIAGNOSIS — Z992 Dependence on renal dialysis: Secondary | ICD-10-CM | POA: Diagnosis not present

## 2019-10-03 DIAGNOSIS — N186 End stage renal disease: Secondary | ICD-10-CM | POA: Diagnosis not present

## 2019-10-03 DIAGNOSIS — Z992 Dependence on renal dialysis: Secondary | ICD-10-CM | POA: Diagnosis not present

## 2019-10-03 DIAGNOSIS — D509 Iron deficiency anemia, unspecified: Secondary | ICD-10-CM | POA: Diagnosis not present

## 2019-10-03 DIAGNOSIS — D472 Monoclonal gammopathy: Secondary | ICD-10-CM | POA: Diagnosis not present

## 2019-10-03 DIAGNOSIS — N2581 Secondary hyperparathyroidism of renal origin: Secondary | ICD-10-CM | POA: Diagnosis not present

## 2019-10-03 DIAGNOSIS — E1129 Type 2 diabetes mellitus with other diabetic kidney complication: Secondary | ICD-10-CM | POA: Diagnosis not present

## 2019-10-05 ENCOUNTER — Other Ambulatory Visit: Payer: Self-pay

## 2019-10-05 ENCOUNTER — Encounter: Payer: Self-pay | Admitting: Nurse Practitioner

## 2019-10-05 ENCOUNTER — Inpatient Hospital Stay (HOSPITAL_COMMUNITY): Payer: Medicare Other | Attending: Hematology

## 2019-10-05 ENCOUNTER — Ambulatory Visit (INDEPENDENT_AMBULATORY_CARE_PROVIDER_SITE_OTHER): Payer: Medicare Other | Admitting: Nurse Practitioner

## 2019-10-05 ENCOUNTER — Ambulatory Visit (HOSPITAL_COMMUNITY)
Admission: RE | Admit: 2019-10-05 | Discharge: 2019-10-05 | Disposition: A | Discharge status: Home / Self Care | Payer: Medicare Other | Source: Ambulatory Visit | Attending: Hematology | Admitting: Hematology

## 2019-10-05 VITALS — BP 164/56 | Ht 74.0 in | Wt 198.0 lb

## 2019-10-05 DIAGNOSIS — D472 Monoclonal gammopathy: Secondary | ICD-10-CM | POA: Diagnosis not present

## 2019-10-05 DIAGNOSIS — I5042 Chronic combined systolic (congestive) and diastolic (congestive) heart failure: Secondary | ICD-10-CM | POA: Diagnosis not present

## 2019-10-05 DIAGNOSIS — N183 Chronic kidney disease, stage 3 unspecified: Secondary | ICD-10-CM | POA: Diagnosis not present

## 2019-10-05 DIAGNOSIS — E1165 Type 2 diabetes mellitus with hyperglycemia: Secondary | ICD-10-CM | POA: Insufficient documentation

## 2019-10-05 DIAGNOSIS — I255 Ischemic cardiomyopathy: Secondary | ICD-10-CM | POA: Diagnosis not present

## 2019-10-05 DIAGNOSIS — N185 Chronic kidney disease, stage 5: Secondary | ICD-10-CM | POA: Insufficient documentation

## 2019-10-05 DIAGNOSIS — M7989 Other specified soft tissue disorders: Secondary | ICD-10-CM | POA: Diagnosis not present

## 2019-10-05 DIAGNOSIS — K219 Gastro-esophageal reflux disease without esophagitis: Secondary | ICD-10-CM | POA: Diagnosis not present

## 2019-10-05 DIAGNOSIS — Z87891 Personal history of nicotine dependence: Secondary | ICD-10-CM | POA: Diagnosis not present

## 2019-10-05 DIAGNOSIS — D509 Iron deficiency anemia, unspecified: Secondary | ICD-10-CM | POA: Diagnosis not present

## 2019-10-05 DIAGNOSIS — I5043 Acute on chronic combined systolic (congestive) and diastolic (congestive) heart failure: Secondary | ICD-10-CM | POA: Diagnosis not present

## 2019-10-05 DIAGNOSIS — E785 Hyperlipidemia, unspecified: Secondary | ICD-10-CM | POA: Insufficient documentation

## 2019-10-05 DIAGNOSIS — Z951 Presence of aortocoronary bypass graft: Secondary | ICD-10-CM

## 2019-10-05 DIAGNOSIS — N186 End stage renal disease: Secondary | ICD-10-CM | POA: Diagnosis not present

## 2019-10-05 DIAGNOSIS — I132 Hypertensive heart and chronic kidney disease with heart failure and with stage 5 chronic kidney disease, or end stage renal disease: Secondary | ICD-10-CM | POA: Insufficient documentation

## 2019-10-05 DIAGNOSIS — E782 Mixed hyperlipidemia: Secondary | ICD-10-CM | POA: Diagnosis not present

## 2019-10-05 DIAGNOSIS — Z7951 Long term (current) use of inhaled steroids: Secondary | ICD-10-CM | POA: Insufficient documentation

## 2019-10-05 DIAGNOSIS — I2510 Atherosclerotic heart disease of native coronary artery without angina pectoris: Secondary | ICD-10-CM | POA: Insufficient documentation

## 2019-10-05 DIAGNOSIS — Z992 Dependence on renal dialysis: Secondary | ICD-10-CM | POA: Diagnosis not present

## 2019-10-05 LAB — COMPREHENSIVE METABOLIC PANEL WITH GFR
ALT: 18 U/L (ref 0–44)
AST: 22 U/L (ref 15–41)
Albumin: 3.8 g/dL (ref 3.5–5.0)
Alkaline Phosphatase: 60 U/L (ref 38–126)
Anion gap: 15 (ref 5–15)
BUN: 83 mg/dL — ABNORMAL HIGH (ref 8–23)
CO2: 24 mmol/L (ref 22–32)
Calcium: 9 mg/dL (ref 8.9–10.3)
Chloride: 94 mmol/L — ABNORMAL LOW (ref 98–111)
Creatinine, Ser: 7 mg/dL — ABNORMAL HIGH (ref 0.61–1.24)
GFR calc Af Amer: 8 mL/min — ABNORMAL LOW
GFR calc non Af Amer: 7 mL/min — ABNORMAL LOW
Glucose, Bld: 231 mg/dL — ABNORMAL HIGH (ref 70–99)
Potassium: 3.9 mmol/L (ref 3.5–5.1)
Sodium: 133 mmol/L — ABNORMAL LOW (ref 135–145)
Total Bilirubin: 0.3 mg/dL (ref 0.3–1.2)
Total Protein: 6.8 g/dL (ref 6.5–8.1)

## 2019-10-05 LAB — CBC WITH DIFFERENTIAL/PLATELET
Abs Immature Granulocytes: 0.02 10*3/uL (ref 0.00–0.07)
Basophils Absolute: 0 10*3/uL (ref 0.0–0.2)
Basophils Absolute: 0 10*3/uL (ref 0.0–0.1)
Basophils Relative: 1 %
Basos: 1 %
EOS (ABSOLUTE): 0.3 10*3/uL (ref 0.0–0.4)
Eos: 5 %
Eosinophils Absolute: 0.3 10*3/uL (ref 0.0–0.5)
Eosinophils Relative: 5 %
HCT: 34.3 % — ABNORMAL LOW (ref 39.0–52.0)
Hematocrit: 32.6 % — ABNORMAL LOW (ref 37.5–51.0)
Hemoglobin: 10.8 g/dL — ABNORMAL LOW (ref 13.0–17.7)
Hemoglobin: 11 g/dL — ABNORMAL LOW (ref 13.0–17.0)
Immature Grans (Abs): 0 10*3/uL (ref 0.0–0.1)
Immature Granulocytes: 0 %
Immature Granulocytes: 0 %
Lymphocytes Absolute: 1.2 10*3/uL (ref 0.7–3.1)
Lymphocytes Relative: 23 %
Lymphs Abs: 1.2 10*3/uL (ref 0.7–4.0)
Lymphs: 22 %
MCH: 32.3 pg (ref 26.6–33.0)
MCH: 31.9 pg (ref 26.0–34.0)
MCHC: 33.1 g/dL (ref 31.5–35.7)
MCHC: 32.1 g/dL (ref 30.0–36.0)
MCV: 98 fL — ABNORMAL HIGH (ref 79–97)
MCV: 99.4 fL (ref 80.0–100.0)
Monocytes Absolute: 0.8 10*3/uL (ref 0.1–0.9)
Monocytes Absolute: 0.8 10*3/uL (ref 0.1–1.0)
Monocytes Relative: 14 %
Monocytes: 15 %
Neutro Abs: 3.2 10*3/uL (ref 1.7–7.7)
Neutrophils Absolute: 3 10*3/uL (ref 1.4–7.0)
Neutrophils Relative %: 57 %
Neutrophils: 57 %
Platelets: 212 10*3/uL (ref 150–450)
Platelets: 218 10*3/uL (ref 150–400)
RBC: 3.34 x10E6/uL — ABNORMAL LOW (ref 4.14–5.80)
RBC: 3.45 MIL/uL — ABNORMAL LOW (ref 4.22–5.81)
RDW: 12.6 % (ref 11.6–15.4)
RDW: 12.6 % (ref 11.5–15.5)
WBC: 5.3 10*3/uL (ref 3.4–10.8)
WBC: 5.5 10*3/uL (ref 4.0–10.5)
nRBC: 0 % (ref 0.0–0.2)

## 2019-10-05 LAB — LIPID PANEL
Chol/HDL Ratio: 3.3 ratio (ref 0.0–5.0)
Cholesterol, Total: 163 mg/dL (ref 100–199)
HDL: 50 mg/dL
LDL Chol Calc (NIH): 99 mg/dL (ref 0–99)
Triglycerides: 71 mg/dL (ref 0–149)
VLDL Cholesterol Cal: 14 mg/dL (ref 5–40)

## 2019-10-05 LAB — BASIC METABOLIC PANEL WITH GFR
BUN/Creatinine Ratio: 11 (ref 10–24)
BUN: 74 mg/dL — ABNORMAL HIGH (ref 8–27)
CO2: 24 mmol/L (ref 20–29)
Calcium: 9.2 mg/dL (ref 8.6–10.2)
Chloride: 93 mmol/L — ABNORMAL LOW (ref 96–106)
Creatinine, Ser: 6.6 mg/dL — ABNORMAL HIGH (ref 0.76–1.27)
GFR calc Af Amer: 9 mL/min/{1.73_m2} — ABNORMAL LOW
GFR calc non Af Amer: 8 mL/min/{1.73_m2} — ABNORMAL LOW
Glucose: 250 mg/dL — ABNORMAL HIGH (ref 65–99)
Potassium: 4.4 mmol/L (ref 3.5–5.2)
Sodium: 135 mmol/L (ref 134–144)

## 2019-10-05 LAB — HEPATIC FUNCTION PANEL
ALT: 14 IU/L (ref 0–44)
AST: 20 IU/L (ref 0–40)
Albumin: 4.1 g/dL (ref 3.7–4.7)
Alkaline Phosphatase: 70 IU/L (ref 48–121)
Bilirubin Total: 0.2 mg/dL (ref 0.0–1.2)
Bilirubin, Direct: 0.08 mg/dL (ref 0.00–0.40)
Total Protein: 6.5 g/dL (ref 6.0–8.5)

## 2019-10-05 LAB — LACTATE DEHYDROGENASE: LDH: 195 U/L — ABNORMAL HIGH (ref 98–192)

## 2019-10-05 NOTE — Patient Instructions (Addendum)
After Visit Summary:  We will be checking the following labs today - BMET, CBC, HPF and lipids  Medication Instructions:    Continue with your current medicines.    If you need a refill on your cardiac medications before your next appointment, please call your pharmacy.     Testing/Procedures To Be Arranged:  N/A  Follow-Up:   See [name] in 4 months    At [facility], you and your health needs are our priority.  As part of our continuing mission to provide you with exceptional heart care, we have created designated Provider Care Teams.  These Care Teams include your primary Cardiologist (physician) and Advanced Practice Providers (APPs -  Physician Assistants and Nurse Practitioners) who all work together to provide you with the care you need, when you need it.  Special Instructions:  . Stay safe, wash your hands for at least 20 seconds and wear a mask when needed.  . It was good to talk with you today.    Call the [place] office at [phone] if you have any questions, problems or concerns.

## 2019-10-06 DIAGNOSIS — Z992 Dependence on renal dialysis: Secondary | ICD-10-CM | POA: Diagnosis not present

## 2019-10-06 DIAGNOSIS — E1129 Type 2 diabetes mellitus with other diabetic kidney complication: Secondary | ICD-10-CM | POA: Diagnosis not present

## 2019-10-06 DIAGNOSIS — N2581 Secondary hyperparathyroidism of renal origin: Secondary | ICD-10-CM | POA: Diagnosis not present

## 2019-10-06 DIAGNOSIS — D472 Monoclonal gammopathy: Secondary | ICD-10-CM | POA: Diagnosis not present

## 2019-10-06 DIAGNOSIS — D509 Iron deficiency anemia, unspecified: Secondary | ICD-10-CM | POA: Diagnosis not present

## 2019-10-06 DIAGNOSIS — N186 End stage renal disease: Secondary | ICD-10-CM | POA: Diagnosis not present

## 2019-10-06 LAB — KAPPA/LAMBDA LIGHT CHAINS
Kappa free light chain: 140.2 mg/L — ABNORMAL HIGH (ref 3.3–19.4)
Kappa, lambda light chain ratio: 1.01 (ref 0.26–1.65)
Lambda free light chains: 138.4 mg/L — ABNORMAL HIGH (ref 5.7–26.3)

## 2019-10-06 LAB — PROTEIN ELECTROPHORESIS, SERUM
A/G Ratio: 1.3 (ref 0.7–1.7)
Albumin ELP: 3.5 g/dL (ref 2.9–4.4)
Alpha-1-Globulin: 0.3 g/dL (ref 0.0–0.4)
Alpha-2-Globulin: 0.8 g/dL (ref 0.4–1.0)
Beta Globulin: 0.8 g/dL (ref 0.7–1.3)
Gamma Globulin: 0.8 g/dL (ref 0.4–1.8)
Globulin, Total: 2.7 g/dL (ref 2.2–3.9)
M-Spike, %: 0.4 g/dL — ABNORMAL HIGH
Total Protein ELP: 6.2 g/dL (ref 6.0–8.5)

## 2019-10-07 ENCOUNTER — Telehealth: Payer: Self-pay | Admitting: Nurse Practitioner

## 2019-10-07 ENCOUNTER — Other Ambulatory Visit: Payer: Self-pay | Admitting: *Deleted

## 2019-10-07 DIAGNOSIS — E782 Mixed hyperlipidemia: Secondary | ICD-10-CM

## 2019-10-07 MED ORDER — EZETIMIBE 10 MG PO TABS: 10.0000 mg | ORAL_TABLET | Freq: Every day | ORAL | 3 refills | Status: AC

## 2019-10-07 NOTE — Telephone Encounter (Signed)
Patient returning call for lab results. 

## 2019-10-08 DIAGNOSIS — D509 Iron deficiency anemia, unspecified: Secondary | ICD-10-CM | POA: Diagnosis not present

## 2019-10-08 DIAGNOSIS — Z992 Dependence on renal dialysis: Secondary | ICD-10-CM | POA: Diagnosis not present

## 2019-10-08 DIAGNOSIS — N186 End stage renal disease: Secondary | ICD-10-CM | POA: Diagnosis not present

## 2019-10-08 DIAGNOSIS — D472 Monoclonal gammopathy: Secondary | ICD-10-CM | POA: Diagnosis not present

## 2019-10-08 DIAGNOSIS — N2581 Secondary hyperparathyroidism of renal origin: Secondary | ICD-10-CM | POA: Diagnosis not present

## 2019-10-08 DIAGNOSIS — E1129 Type 2 diabetes mellitus with other diabetic kidney complication: Secondary | ICD-10-CM | POA: Diagnosis not present

## 2019-10-10 DIAGNOSIS — D472 Monoclonal gammopathy: Secondary | ICD-10-CM | POA: Diagnosis not present

## 2019-10-10 DIAGNOSIS — N186 End stage renal disease: Secondary | ICD-10-CM | POA: Diagnosis not present

## 2019-10-10 DIAGNOSIS — Z992 Dependence on renal dialysis: Secondary | ICD-10-CM | POA: Diagnosis not present

## 2019-10-10 DIAGNOSIS — D509 Iron deficiency anemia, unspecified: Secondary | ICD-10-CM | POA: Diagnosis not present

## 2019-10-10 DIAGNOSIS — N2581 Secondary hyperparathyroidism of renal origin: Secondary | ICD-10-CM | POA: Diagnosis not present

## 2019-10-10 DIAGNOSIS — E1129 Type 2 diabetes mellitus with other diabetic kidney complication: Secondary | ICD-10-CM | POA: Diagnosis not present

## 2019-10-12 ENCOUNTER — Inpatient Hospital Stay (HOSPITAL_BASED_OUTPATIENT_CLINIC_OR_DEPARTMENT_OTHER): Payer: Medicare Other | Admitting: Hematology

## 2019-10-12 ENCOUNTER — Encounter (HOSPITAL_COMMUNITY): Payer: Self-pay | Admitting: Hematology

## 2019-10-12 ENCOUNTER — Other Ambulatory Visit: Payer: Self-pay

## 2019-10-12 VITALS — BP 133/81 | HR 86 | Temp 97.9°F | Resp 16 | Wt 196.0 lb

## 2019-10-12 DIAGNOSIS — D472 Monoclonal gammopathy: Secondary | ICD-10-CM

## 2019-10-12 DIAGNOSIS — N186 End stage renal disease: Secondary | ICD-10-CM | POA: Diagnosis not present

## 2019-10-12 DIAGNOSIS — M7989 Other specified soft tissue disorders: Secondary | ICD-10-CM | POA: Diagnosis not present

## 2019-10-12 DIAGNOSIS — D509 Iron deficiency anemia, unspecified: Secondary | ICD-10-CM

## 2019-10-12 DIAGNOSIS — K219 Gastro-esophageal reflux disease without esophagitis: Secondary | ICD-10-CM | POA: Diagnosis not present

## 2019-10-12 DIAGNOSIS — I132 Hypertensive heart and chronic kidney disease with heart failure and with stage 5 chronic kidney disease, or end stage renal disease: Secondary | ICD-10-CM | POA: Diagnosis not present

## 2019-10-12 NOTE — Progress Notes (Signed)
[place]ore[place] Bells, [place] [id number]   CLINIC:  Medical Oncology/Hematology  PCP:  [name], NP [place] / [place] [address]  [phone]  REASON FOR VISIT:  Follow-up for IgG lambda MGUS and iron deficiency anemia  PRIOR THERAPY: None  CURRENT THERAPY: Intermittent Retacrit and Feraheme  INTERVAL HISTORY:  [name], a 69 y.o. male, returns for routine follow-up for his MGSU and iron deficiency anemia. Henry Carroll was last seen on 05/25/2019.  Today he reports feeling well and denies recent infections, F/C, night sweats, or weight loss. He started dialysis on 7/20 and currently does it in the center, but wants to transition to doing it at home. He is receiving iron transfusion during dialysis and denies having hematochezia or hematuria.   REVIEW OF SYSTEMS:  Review of Systems  Constitutional: Negative for appetite change, chills, diaphoresis, fatigue, fever and unexpected weight change.  Cardiovascular: Negative for leg swelling.  Gastrointestinal: Negative for blood in stool.  Genitourinary: Negative for hematuria.     PAST MEDICAL/SURGICAL HISTORY:  Past Medical History:  Diagnosis Date  . Abnormal stress test 02/10/2015  . Acute on chronic systolic and diastolic heart failure, NYHA class 1 ([place]) 12/31/2014  . Anemia   . Anemia in chronic kidney disease 09/03/2012  . 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 Asthma   . Asthma, chronic 06/04/2012  . Asthma, chronic, mild intermittent, with acute exacerbation 12/22/2015  . Bilateral lower extremity edema 12/22/2015  . CAD (coronary artery disease) 02/18/2015  . Chronic combined systolic (congestive) and diastolic (congestive) heart failure ([place])    a. 12/31/14: 2D ECHO: EF 40-45%, HK of inf myocardium, G1DD, mod MR  . Chronic combined systolic and diastolic CHF (congestive heart failure) ([place]) 02/10/2015  . Chronic systolic heart failure ([place]) 02/25/2015  . CKD  (chronic kidney disease), stage III    stage 3 kidney disease  . CKD (chronic kidney disease), stage V ([place]) 08/30/2018  . COLONIC POLYPS, HX OF 04/13/2009   Qualifier: Diagnosis of  By: Westly Pam.   . Coronary artery disease    a. LHC 01/2015 - triple vessel CAD (mod oLM, mLAD, severe mRCA, intermediate branch stenosis, CTO of mCx). Plan CABG 02/2015.  Marland Kitchen Demand ischemia ([place]) 12/31/2014  . DM type 2, uncontrolled, with renal complications ([place]) 03/11/1186  . Dyspnea     due to fluid  . Elevated troponin   . Essential hypertension 10/09/2013  . Folliculitis of perineum 10/01/2012  . Gastric erosion   . GERD (gastroesophageal reflux disease)   . History of blood transfusion   . Hyperlipidemia   . Hyperlipidemia with target LDL less than 100 12/31/2014  . Hypertension   . Hypertensive heart disease with heart failure ([place]) 02/25/2015  . IDA (iron deficiency anemia) 01/23/2016  . Melena 01/23/2016  . 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 Mitral regurgitation    a. Mild-mod by echo 12/2014.  . Morbid obesity ([place]) 12/29/2014  . Myocardial infarction [facility])    pt. states per [name] he has in the past  . NSVT (nonsustained ventricular tachycardia) ([place])    a. 9 beats during 01/2015 adm. BB titrated.  . Obesity    a. BMI 33  . Obesity (BMI 30-39.9) 05/05/2013  . OSA (obstructive sleep apnea) 01/16/2018   Mild obstructive sleep apnea overall with an AHI of 7.3/h and no significant central sleep apnea. Severe obstructive sleep  apnea during REM sleep with an AHI of 34.3/h.  Now on CPAP at 6 cm H2O.   . Other malaise and fatigue 09/03/2012  . 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 PAD (peripheral artery disease) ([place]) 03/02/2015  . Pneumonia   . Sleep apnea    2019, Dr.Turner diagnoised   . Small bowel lesion   . Symptomatic anemia 08/29/2018  . 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 Type II diabetes mellitus ([place])   . Wears dentures    Past  Surgical History:  Procedure Laterality Date  . BACK SURGERY    . BASCILIC VEIN TRANSPOSITION Left 11/14/2018   Procedure: FIRST STAGE BASILIC VEIN TRANSPOSITION LEFT ARM;  Surgeon: Serafina Mitchell, MD;  Location: [facility];  Service: Vascular;  Laterality: Left;  . BASCILIC VEIN TRANSPOSITION Left 01/28/2019   Procedure: BASILIC VEIN TRANSPOSITION SECOND STAGE;  Surgeon: Serafina Mitchell, MD;  Location: [facility];  Service: Vascular;  Laterality: Left;  . BONE MARROW BIOPSY    . CARDIAC CATHETERIZATION N/A 02/09/2015   Procedure: Left Heart Cath and Coronary Angiography;  Surgeon: Burnell Blanks, MD;  Location: [facility];  Service: Cardiovascular;  Laterality: N/A;  . COLONOSCOPY  2011   Dr. [name]: Ileocecal valve appeared normal, scattered pancolonic diverticulosis, difficult bowel prep making smaller lesions potentially missed. Recommended three-year follow-up colonoscopy.  . COLONOSCOPY  2003   Dr. [name]: Suspicious lesion at the ileocecal valve, multiple biopsies benign, pancolonic diverticulosis.  . COLONOSCOPY N/A 02/15/2016   diverticulosis in sigmoid and descending colon, single 5 mm polyp at splenic flexure (Tubular adenoma)  . CORONARY ARTERY BYPASS GRAFT N/A 02/18/2015   Procedure: CORONARY ARTERY BYPASS GRAFTING (CABG) x  four, using bilateral internal mammary arteries and right leg greater saphenous vein harvested endoscopically;  Surgeon: [name], MD;  Location: [facility];  Service: Open Heart Surgery;  Laterality: N/A;  . ESOPHAGOGASTRODUODENOSCOPY N/A 02/15/2016   normal  . EYE SURGERY Right    July 2019   . GIVENS CAPSULE STUDY N/A 01/08/2017   couple of gastric and small bowel eroions in setting of aspirin 81 mg daily but nothing concerning, continue Hematology follow-up  . HERNIA REPAIR  0630   Umbilical  . LUMBAR [place] SURGERY  2006   L4 & L5  . REFRACTIVE SURGERY Left    2019   . REFRACTIVE SURGERY Right    2019  . TEE WITHOUT CARDIOVERSION N/A 02/18/2015    Procedure: TRANSESOPHAGEAL ECHOCARDIOGRAM (TEE);  Surgeon: [name], MD;  Location: [facility];  Service: Open Heart Surgery;  Laterality: N/A;  . TONSILLECTOMY  1962    SOCIAL HISTORY:  Social History   Socioeconomic History  . Marital status: Married    Spouse name: Not on file  . Number of children: Not on file  . Years of education: Not on file  . Highest education level: Not on file  Occupational History  . Not on file  Tobacco Use  . Smoking status: Former Smoker    Years: 17.00    Types: Cigars    Quit date: 12/31/2014    Years since quitting: 4.7  . Smokeless tobacco: Never Used  Vaping Use  . Vaping Use: Never used  Substance and Sexual Activity  . Alcohol use: Yes    Alcohol/week: 3.0 standard drinks    Types: 3 Glasses of wine per week    Comment: occasional glass of wine.  . Drug use: No  . Sexual activity: Yes  Birth control/protection: None  Other Topics Concern  . Not on file  Social History Narrative  . Not on file   Social Determinants of Health   Financial Resource Strain:   . Difficulty of Paying Living Expenses: Not on file  Food Insecurity:   . Worried About Charity fundraiser in the Last Year: Not on file  . Ran Out of Food in the Last Year: Not on file  Transportation Needs:   . Lack of Transportation (Medical): Not on file  . Lack of Transportation (Non-Medical): Not on file  Physical Activity:   . Days of Exercise per Week: Not on file  . Minutes of Exercise per Session: Not on file  Stress:   . Feeling of Stress : Not on file  Social Connections:   . Frequency of Communication with Friends and Family: Not on file  . Frequency of Social Gatherings with Friends and Family: Not on file  . Attends Religious Services: Not on file  . Active Member of Clubs or Organizations: Not on file  . Attends Archivist Meetings: Not on file  . Marital Status: Not on file  Intimate Partner Violence:   . Fear of Current or Ex-Partner:  Not on file  . Emotionally Abused: Not on file  . Physically Abused: Not on file  . Sexually Abused: Not on file    FAMILY HISTORY:  Family History  Problem Relation Age of Onset  . Diabetes Mother   . Heart disease Mother        later in life, age >71  . Heart disease Father        heart failure later in life  . Hypertension Father   . Stroke Sister   . Kidney disease Daughter   . Sudden death Daughter   . Colon cancer Paternal Uncle        Passed from colon CA  . Heart attack Neg Hx     CURRENT MEDICATIONS:  Current Outpatient Medications  Medication Sig Dispense Refill  . acetaminophen (TYLENOL) 500 MG tablet Take 1,000 mg by mouth 2 (two) times daily as needed for moderate pain.    Marland Kitchen ADVAIR DISKUS 100-50 MCG/DOSE AEPB SMARTSIG:1 Puff(s) Via Inhaler Twice Daily PRN    . amLODipine (NORVASC) 10 MG tablet Take 1 tablet by mouth once daily 90 tablet 1  . aspirin EC 81 MG tablet Take 1 tablet (81 mg total) by mouth daily. 90 tablet 3  . atorvastatin (LIPITOR) 40 MG tablet Take 1 tablet (40 mg total) by mouth every evening. 90 tablet 1  . carvedilol (COREG) 12.5 MG tablet Take 12.5 mg by mouth 2 (two) times daily.    . Cholecalciferol (VITAMIN D3) 25 MCG (1000 UT) CAPS Take 2 capsules by mouth daily.    . Cinnamon 500 MG capsule Take 1,000 mg by mouth daily.     . Coenzyme Q10 10 MG capsule Take 10 mg by mouth daily.    Marland Kitchen epoetin alfa-epbx (RETACRIT) 16109 UNIT/ML injection 20,000 Units every 14 (fourteen) days.     Marland Kitchen ezetimibe (ZETIA) 10 MG tablet Take 1 tablet (10 mg total) by mouth daily. 90 tablet 3  . fluticasone (FLONASE) 50 MCG/ACT nasal spray Place 2 sprays into both nostrils daily as needed for allergies or rhinitis (SEASONAL ALLERGIES).    . furosemide (LASIX) 40 MG tablet Take 2 tablets by mouth twice daily (Patient taking differently: Take 40 mg by mouth daily. Taking only on non dialysis days) 360  tablet 1  . glucose blood (ONE TOUCH ULTRA TEST) test strip Use to  test blood sugar three times daily. Dx: E11.21 900 each 1  . hydrALAZINE (APRESOLINE) 50 MG tablet Take 25 mg by mouth daily. Taking just one tablet a day - 25 mg    . insulin detemir (LEVEMIR) 100 UNIT/ML injection Inject 0.15 mLs (15 Units total) into the skin daily. In the morning 10 mL 3  . isosorbide mononitrate (IMDUR) 30 MG 24 hr tablet Take 1 tablet by mouth once daily 90 tablet 0  . LAMISIL AT 1 % cream SMARTSIG:Sparingly Topical PRN    . montelukast (SINGULAIR) 10 MG tablet TAKE 1 TABLET BY MOUTH AT BEDTIME 90 tablet 1  . Multiple Vitamin (MULTIVITAMIN WITH MINERALS) TABS tablet Take 1 tablet by mouth daily.    Marland Kitchen PROAIR HFA 108 (90 Base) MCG/ACT inhaler INHALE 2 PUFFS BY MOUTH EVERY 6 HOURS AS NEEDED FOR SHORTNESS OF BREATH 18 g 0  . nitroGLYCERIN (NITROSTAT) 0.4 MG SL tablet Place 1 tablet (0.4 mg total) under the tongue every 5 (five) minutes as needed for chest pain. (Patient not taking: Reported on 10/12/2019) 25 tablet 3   No current facility-administered medications for this visit.   Facility-Administered Medications Ordered in Other Visits  Medication Dose Route Frequency Provider Last Rate Last Admin  . regadenoson (LEXISCAN) injection SOLN 0.4 mg  0.4 mg Intravenous Once Jerline Pain, MD        ALLERGIES:  No Known Allergies  PHYSICAL EXAM:  Performance status (ECOG): 1 - Symptomatic but completely ambulatory  There were no vitals filed for this visit. Wt Readings from Last 3 Encounters:  10/05/19 198 lb (89.8 kg)  08/26/19 203 lb (92.1 kg)  07/15/19 203 lb (92.1 kg)   Physical Exam Vitals reviewed.  Constitutional:      Appearance: Normal appearance.  Cardiovascular:     Rate and Rhythm: Normal rate and regular rhythm.     Pulses: Normal pulses.     Heart sounds: Normal heart sounds.  Pulmonary:     Effort: Pulmonary effort is normal.     Breath sounds: Normal breath sounds.  Musculoskeletal:     Right lower leg: No edema.     Left lower leg: No edema.    Neurological:     General: No focal deficit present.     Mental Status: He is alert and oriented to person, place, and time.  Psychiatric:        Mood and Affect: Mood normal.        Behavior: Behavior normal.     LABORATORY DATA:  I have reviewed the labs as listed.  CBC Latest Ref Rng & Units 10/05/2019 10/05/2019 08/26/2019  WBC 4.0 - 10.5 K/uL 5.5 5.3 -  Hemoglobin 13.0 - 17.0 g/dL 11.0(L) 10.8(L) 10.0(L)  Hematocrit 39 - 52 % 34.3(L) 32.6(L) -  Platelets 150 - 400 K/uL 218 212 -   CMP Latest Ref Rng & Units 10/05/2019 10/05/2019 08/12/2019  Glucose 70 - 99 mg/dL 231(H) 250(H) 249(H)  BUN 8 - 23 mg/dL 83(H) 74(H) 138(H)  Creatinine 0.61 - 1.24 mg/dL 7.00(H) 6.60(H) 7.05(H)  Sodium 135 - 145 mmol/L 133(L) 135 135  Potassium 3.5 - 5.1 mmol/L 3.9 4.4 3.9  Chloride 98 - 111 mmol/L 94(L) 93(L) 100  CO2 22 - 32 mmol/L 24 24 21(L)  Calcium 8.9 - 10.3 mg/dL 9.0 9.2 8.9  Total Protein 6.5 - 8.1 g/dL 6.8 6.5 -  Total Bilirubin 0.3 -  1.2 mg/dL 0.3 0.2 -  Alkaline Phos 38 - 126 U/L 60 70 -  AST 15 - 41 U/L 22 20 -  ALT 0 - 44 U/L 18 14 -      Component Value Date/Time   RBC 3.45 (L) 10/05/2019 1345   MCV 99.4 10/05/2019 1345   MCV 98 (H) 10/05/2019 0916   MCH 31.9 10/05/2019 1345   MCHC 32.1 10/05/2019 1345   RDW 12.6 10/05/2019 1345   RDW 12.6 10/05/2019 0916   LYMPHSABS 1.2 10/05/2019 1345   LYMPHSABS 1.2 10/05/2019 0916   MONOABS 0.8 10/05/2019 1345   EOSABS 0.3 10/05/2019 1345   EOSABS 0.3 10/05/2019 0916   BASOSABS 0.0 10/05/2019 1345   BASOSABS 0.0 10/05/2019 0916   Lab Results  Component Value Date   LDH 195 (H) 10/05/2019   LDH 262 (H) 02/10/2019   LDH 196 (H) 10/14/2018   Lab Results  Component Value Date   TOTALPROTELP 6.2 10/05/2019   ALBUMINELP 3.5 10/05/2019   A1GS 0.3 10/05/2019   A2GS 0.8 10/05/2019   BETS 0.8 10/05/2019   GAMS 0.8 10/05/2019   MSPIKE 0.4 (H) 10/05/2019   SPEI Comment 10/05/2019    Lab Results  Component Value Date    KPAFRELGTCHN 140.2 (H) 10/05/2019   LAMBDASER 138.4 (H) 10/05/2019   KAPLAMBRATIO 1.01 10/05/2019    DIAGNOSTIC IMAGING:  I have independently reviewed the scans and discussed with the patient. DG Bone Survey Met  Result Date: 10/05/2019 CLINICAL DATA:  MGUS EXAM: METASTATIC BONE SURVEY COMPARISON:  09/16/2018 FINDINGS: Degenerative changes are again seen in the cervical, thoracic and lumbar spines. No acute compression deformity is noted. Upper and lower extremities demonstrate no lytic or sclerotic foci. Degenerative changes of the knee joints are seen. Diffuse vascular calcifications are seen. Degenerative changes of the acromioclavicular joints are noted bilaterally. IMPRESSION: No lytic or sclerotic lesions. No significant change from the prior exam. Electronically Signed   By: Inez Catalina M.D.   On: 10/05/2019 22:08     ASSESSMENT:  1.  IgG lambda MGUS: -BM BX on 09/29/2018 done secondary to worsening kidney function showed hypercellular marrow with 10% lambda restricted plasma cells.  Chromosome analysis and FISH panel were normal. -PET scan on 10/13/2018 did not show any evidence of myeloma. -Kidney biopsy on 11/21/2018 showed severe diabetic nephropathy with nodular glomerulosclerosis, severe arteriolar hyalinosis, focal and segmental glomerular tuft scarring in association with severe arterial sclerosis and diffuse severe interstitial fibrosis and tubular atrophy.  No evidence of monoclonal immunoglobulin deposition disease or an immune complex mediated glomerulonephritis. -Skeletal survey on 10/05/2019 did not show any lytic lesions.  2.  Leg swelling: -He will continue Lasix 80 mg twice daily.  3.  Normocytic anemia: -This is from CKD and iron deficiency. -He received Feraheme today.  His labs show a percent saturation of 10 and ferritin of 171 on 05/18/2019. -He is also receiving Retacrit 20,000 units as needed.  Hemoglobin is 10.0.   PLAN:  1.  IgG lambda MGUS: -Reviewed labs  from 10/05/2019.  Hemoglobin is 11.  M spike is 0.4.  Free kappa light chains are 140, lambda light chains 138 with ratio of 1.01. -We also reviewed skeletal survey results which was negative. -RTC 6 months with labs 1 week prior.  2.  Leg swelling: -This has improved since he has been on dialysis.  3.  ESRD on HD: -He was reportedly started on dialysis in July and he is tolerating well.   Orders placed this  encounter:  No orders of the defined types were placed in this encounter.    Derek Jack, MD [place] [phone]   I, Milinda Antis, am acting as a scribe for Dr. Sanda Linger.  I, Derek Jack MD, have reviewed the above documentation for accuracy and completeness, and I agree with the above.

## 2019-10-12 NOTE — Patient Instructions (Signed)
[facility] at [facility] Discharge Instructions  You were seen today by Dr. Katragadda. He went over your recent results. Dr. Katragadda will see you back in 6 months for labs and follow up.   Thank you for choosing [facility] at [facility] to provide your oncology and hematology care.  To afford each patient quality time with our provider, please arrive at least 15 minutes before your scheduled appointment time.   If you have a lab appointment with the Cancer Center please come in thru the Main Entrance and check in at the main information desk  You need to re-schedule your appointment should you arrive 10 or more minutes late.  We strive to give you quality time with our providers, and arriving late affects you and other patients whose appointments are after yours.  Also, if you no show three or more times for appointments you may be dismissed from the clinic at the providers discretion.     Again, thank you for choosing [facility].  Our hope is that these requests will decrease the amount of time that you wait before being seen by our physicians.       _____________________________________________________________  Should you have questions after your visit to [facility], please contact our office at [phone] between the hours of 8:00 a.m. and 4:30 p.m.  Voicemails left after 4:00 p.m. will not be returned until the following business day.  For prescription refill requests, have your pharmacy contact our office and allow 72 hours.    Cancer Center Support Programs:   > Cancer Support Group  2nd Tuesday of the month 1pm-2pm, Journey Room    

## 2019-10-13 DIAGNOSIS — E1129 Type 2 diabetes mellitus with other diabetic kidney complication: Secondary | ICD-10-CM | POA: Diagnosis not present

## 2019-10-13 DIAGNOSIS — Z992 Dependence on renal dialysis: Secondary | ICD-10-CM | POA: Diagnosis not present

## 2019-10-13 DIAGNOSIS — D509 Iron deficiency anemia, unspecified: Secondary | ICD-10-CM | POA: Diagnosis not present

## 2019-10-13 DIAGNOSIS — D472 Monoclonal gammopathy: Secondary | ICD-10-CM | POA: Diagnosis not present

## 2019-10-13 DIAGNOSIS — N2581 Secondary hyperparathyroidism of renal origin: Secondary | ICD-10-CM | POA: Diagnosis not present

## 2019-10-13 DIAGNOSIS — N186 End stage renal disease: Secondary | ICD-10-CM | POA: Diagnosis not present

## 2019-10-14 DIAGNOSIS — N186 End stage renal disease: Secondary | ICD-10-CM | POA: Diagnosis not present

## 2019-10-14 DIAGNOSIS — E1122 Type 2 diabetes mellitus with diabetic chronic kidney disease: Secondary | ICD-10-CM | POA: Diagnosis not present

## 2019-10-14 DIAGNOSIS — Z992 Dependence on renal dialysis: Secondary | ICD-10-CM | POA: Diagnosis not present

## 2019-10-15 DIAGNOSIS — Z23 Encounter for immunization: Secondary | ICD-10-CM | POA: Diagnosis not present

## 2019-10-15 DIAGNOSIS — Z992 Dependence on renal dialysis: Secondary | ICD-10-CM | POA: Diagnosis not present

## 2019-10-15 DIAGNOSIS — D509 Iron deficiency anemia, unspecified: Secondary | ICD-10-CM | POA: Diagnosis not present

## 2019-10-15 DIAGNOSIS — D472 Monoclonal gammopathy: Secondary | ICD-10-CM | POA: Diagnosis not present

## 2019-10-15 DIAGNOSIS — E1129 Type 2 diabetes mellitus with other diabetic kidney complication: Secondary | ICD-10-CM | POA: Diagnosis not present

## 2019-10-15 DIAGNOSIS — N2581 Secondary hyperparathyroidism of renal origin: Secondary | ICD-10-CM | POA: Diagnosis not present

## 2019-10-15 DIAGNOSIS — D631 Anemia in chronic kidney disease: Secondary | ICD-10-CM | POA: Diagnosis not present

## 2019-10-15 DIAGNOSIS — N186 End stage renal disease: Secondary | ICD-10-CM | POA: Diagnosis not present

## 2019-10-17 DIAGNOSIS — E1129 Type 2 diabetes mellitus with other diabetic kidney complication: Secondary | ICD-10-CM | POA: Diagnosis not present

## 2019-10-17 DIAGNOSIS — Z992 Dependence on renal dialysis: Secondary | ICD-10-CM | POA: Diagnosis not present

## 2019-10-17 DIAGNOSIS — D509 Iron deficiency anemia, unspecified: Secondary | ICD-10-CM | POA: Diagnosis not present

## 2019-10-17 DIAGNOSIS — D472 Monoclonal gammopathy: Secondary | ICD-10-CM | POA: Diagnosis not present

## 2019-10-17 DIAGNOSIS — N186 End stage renal disease: Secondary | ICD-10-CM | POA: Diagnosis not present

## 2019-10-17 DIAGNOSIS — N2581 Secondary hyperparathyroidism of renal origin: Secondary | ICD-10-CM | POA: Diagnosis not present

## 2019-10-20 DIAGNOSIS — N2581 Secondary hyperparathyroidism of renal origin: Secondary | ICD-10-CM | POA: Diagnosis not present

## 2019-10-20 DIAGNOSIS — N186 End stage renal disease: Secondary | ICD-10-CM | POA: Diagnosis not present

## 2019-10-20 DIAGNOSIS — Z992 Dependence on renal dialysis: Secondary | ICD-10-CM | POA: Diagnosis not present

## 2019-10-20 DIAGNOSIS — D472 Monoclonal gammopathy: Secondary | ICD-10-CM | POA: Diagnosis not present

## 2019-10-20 DIAGNOSIS — D509 Iron deficiency anemia, unspecified: Secondary | ICD-10-CM | POA: Diagnosis not present

## 2019-10-20 DIAGNOSIS — E1129 Type 2 diabetes mellitus with other diabetic kidney complication: Secondary | ICD-10-CM | POA: Diagnosis not present

## 2019-10-22 DIAGNOSIS — N2581 Secondary hyperparathyroidism of renal origin: Secondary | ICD-10-CM | POA: Diagnosis not present

## 2019-10-22 DIAGNOSIS — D472 Monoclonal gammopathy: Secondary | ICD-10-CM | POA: Diagnosis not present

## 2019-10-22 DIAGNOSIS — E1129 Type 2 diabetes mellitus with other diabetic kidney complication: Secondary | ICD-10-CM | POA: Diagnosis not present

## 2019-10-22 DIAGNOSIS — N186 End stage renal disease: Secondary | ICD-10-CM | POA: Diagnosis not present

## 2019-10-22 DIAGNOSIS — Z992 Dependence on renal dialysis: Secondary | ICD-10-CM | POA: Diagnosis not present

## 2019-10-22 DIAGNOSIS — D509 Iron deficiency anemia, unspecified: Secondary | ICD-10-CM | POA: Diagnosis not present

## 2019-10-24 DIAGNOSIS — N2581 Secondary hyperparathyroidism of renal origin: Secondary | ICD-10-CM | POA: Diagnosis not present

## 2019-10-24 DIAGNOSIS — Z992 Dependence on renal dialysis: Secondary | ICD-10-CM | POA: Diagnosis not present

## 2019-10-24 DIAGNOSIS — D472 Monoclonal gammopathy: Secondary | ICD-10-CM | POA: Diagnosis not present

## 2019-10-24 DIAGNOSIS — D509 Iron deficiency anemia, unspecified: Secondary | ICD-10-CM | POA: Diagnosis not present

## 2019-10-24 DIAGNOSIS — N186 End stage renal disease: Secondary | ICD-10-CM | POA: Diagnosis not present

## 2019-10-24 DIAGNOSIS — E1129 Type 2 diabetes mellitus with other diabetic kidney complication: Secondary | ICD-10-CM | POA: Diagnosis not present

## 2019-10-27 DIAGNOSIS — Z992 Dependence on renal dialysis: Secondary | ICD-10-CM | POA: Diagnosis not present

## 2019-10-27 DIAGNOSIS — D472 Monoclonal gammopathy: Secondary | ICD-10-CM | POA: Diagnosis not present

## 2019-10-27 DIAGNOSIS — N2581 Secondary hyperparathyroidism of renal origin: Secondary | ICD-10-CM | POA: Diagnosis not present

## 2019-10-27 DIAGNOSIS — N186 End stage renal disease: Secondary | ICD-10-CM | POA: Diagnosis not present

## 2019-10-27 DIAGNOSIS — D509 Iron deficiency anemia, unspecified: Secondary | ICD-10-CM | POA: Diagnosis not present

## 2019-10-27 DIAGNOSIS — E1129 Type 2 diabetes mellitus with other diabetic kidney complication: Secondary | ICD-10-CM | POA: Diagnosis not present

## 2019-10-29 DIAGNOSIS — N2581 Secondary hyperparathyroidism of renal origin: Secondary | ICD-10-CM | POA: Diagnosis not present

## 2019-10-29 DIAGNOSIS — D509 Iron deficiency anemia, unspecified: Secondary | ICD-10-CM | POA: Diagnosis not present

## 2019-10-29 DIAGNOSIS — Z992 Dependence on renal dialysis: Secondary | ICD-10-CM | POA: Diagnosis not present

## 2019-10-29 DIAGNOSIS — N186 End stage renal disease: Secondary | ICD-10-CM | POA: Diagnosis not present

## 2019-10-29 DIAGNOSIS — E1129 Type 2 diabetes mellitus with other diabetic kidney complication: Secondary | ICD-10-CM | POA: Diagnosis not present

## 2019-10-29 DIAGNOSIS — D472 Monoclonal gammopathy: Secondary | ICD-10-CM | POA: Diagnosis not present

## 2019-10-31 DIAGNOSIS — N186 End stage renal disease: Secondary | ICD-10-CM | POA: Diagnosis not present

## 2019-10-31 DIAGNOSIS — D472 Monoclonal gammopathy: Secondary | ICD-10-CM | POA: Diagnosis not present

## 2019-10-31 DIAGNOSIS — Z992 Dependence on renal dialysis: Secondary | ICD-10-CM | POA: Diagnosis not present

## 2019-10-31 DIAGNOSIS — D509 Iron deficiency anemia, unspecified: Secondary | ICD-10-CM | POA: Diagnosis not present

## 2019-10-31 DIAGNOSIS — E1129 Type 2 diabetes mellitus with other diabetic kidney complication: Secondary | ICD-10-CM | POA: Diagnosis not present

## 2019-10-31 DIAGNOSIS — N2581 Secondary hyperparathyroidism of renal origin: Secondary | ICD-10-CM | POA: Diagnosis not present

## 2019-11-03 DIAGNOSIS — N186 End stage renal disease: Secondary | ICD-10-CM | POA: Diagnosis not present

## 2019-11-03 DIAGNOSIS — E1129 Type 2 diabetes mellitus with other diabetic kidney complication: Secondary | ICD-10-CM | POA: Diagnosis not present

## 2019-11-03 DIAGNOSIS — D472 Monoclonal gammopathy: Secondary | ICD-10-CM | POA: Diagnosis not present

## 2019-11-03 DIAGNOSIS — Z992 Dependence on renal dialysis: Secondary | ICD-10-CM | POA: Diagnosis not present

## 2019-11-03 DIAGNOSIS — N2581 Secondary hyperparathyroidism of renal origin: Secondary | ICD-10-CM | POA: Diagnosis not present

## 2019-11-03 DIAGNOSIS — D509 Iron deficiency anemia, unspecified: Secondary | ICD-10-CM | POA: Diagnosis not present

## 2019-11-04 ENCOUNTER — Other Ambulatory Visit: Payer: Self-pay | Admitting: Nurse Practitioner

## 2019-11-05 DIAGNOSIS — D472 Monoclonal gammopathy: Secondary | ICD-10-CM | POA: Diagnosis not present

## 2019-11-05 DIAGNOSIS — Z992 Dependence on renal dialysis: Secondary | ICD-10-CM | POA: Diagnosis not present

## 2019-11-05 DIAGNOSIS — E1129 Type 2 diabetes mellitus with other diabetic kidney complication: Secondary | ICD-10-CM | POA: Diagnosis not present

## 2019-11-05 DIAGNOSIS — N186 End stage renal disease: Secondary | ICD-10-CM | POA: Diagnosis not present

## 2019-11-05 DIAGNOSIS — N2581 Secondary hyperparathyroidism of renal origin: Secondary | ICD-10-CM | POA: Diagnosis not present

## 2019-11-05 DIAGNOSIS — D509 Iron deficiency anemia, unspecified: Secondary | ICD-10-CM | POA: Diagnosis not present

## 2019-11-07 DIAGNOSIS — E1129 Type 2 diabetes mellitus with other diabetic kidney complication: Secondary | ICD-10-CM | POA: Diagnosis not present

## 2019-11-07 DIAGNOSIS — N2581 Secondary hyperparathyroidism of renal origin: Secondary | ICD-10-CM | POA: Diagnosis not present

## 2019-11-07 DIAGNOSIS — Z992 Dependence on renal dialysis: Secondary | ICD-10-CM | POA: Diagnosis not present

## 2019-11-07 DIAGNOSIS — N186 End stage renal disease: Secondary | ICD-10-CM | POA: Diagnosis not present

## 2019-11-07 DIAGNOSIS — D509 Iron deficiency anemia, unspecified: Secondary | ICD-10-CM | POA: Diagnosis not present

## 2019-11-07 DIAGNOSIS — D472 Monoclonal gammopathy: Secondary | ICD-10-CM | POA: Diagnosis not present

## 2019-11-10 DIAGNOSIS — N2581 Secondary hyperparathyroidism of renal origin: Secondary | ICD-10-CM | POA: Diagnosis not present

## 2019-11-10 DIAGNOSIS — D509 Iron deficiency anemia, unspecified: Secondary | ICD-10-CM | POA: Diagnosis not present

## 2019-11-10 DIAGNOSIS — Z992 Dependence on renal dialysis: Secondary | ICD-10-CM | POA: Diagnosis not present

## 2019-11-10 DIAGNOSIS — N186 End stage renal disease: Secondary | ICD-10-CM | POA: Diagnosis not present

## 2019-11-10 DIAGNOSIS — D472 Monoclonal gammopathy: Secondary | ICD-10-CM | POA: Diagnosis not present

## 2019-11-10 DIAGNOSIS — E1129 Type 2 diabetes mellitus with other diabetic kidney complication: Secondary | ICD-10-CM | POA: Diagnosis not present

## 2019-11-12 DIAGNOSIS — N186 End stage renal disease: Secondary | ICD-10-CM | POA: Diagnosis not present

## 2019-11-12 DIAGNOSIS — Z992 Dependence on renal dialysis: Secondary | ICD-10-CM | POA: Diagnosis not present

## 2019-11-12 DIAGNOSIS — N2581 Secondary hyperparathyroidism of renal origin: Secondary | ICD-10-CM | POA: Diagnosis not present

## 2019-11-12 DIAGNOSIS — D509 Iron deficiency anemia, unspecified: Secondary | ICD-10-CM | POA: Diagnosis not present

## 2019-11-12 DIAGNOSIS — D472 Monoclonal gammopathy: Secondary | ICD-10-CM | POA: Diagnosis not present

## 2019-11-12 DIAGNOSIS — E1129 Type 2 diabetes mellitus with other diabetic kidney complication: Secondary | ICD-10-CM | POA: Diagnosis not present

## 2019-11-13 DIAGNOSIS — N186 End stage renal disease: Secondary | ICD-10-CM | POA: Diagnosis not present

## 2019-11-13 DIAGNOSIS — Z992 Dependence on renal dialysis: Secondary | ICD-10-CM | POA: Diagnosis not present

## 2019-11-13 DIAGNOSIS — E1122 Type 2 diabetes mellitus with diabetic chronic kidney disease: Secondary | ICD-10-CM | POA: Diagnosis not present

## 2019-11-14 DIAGNOSIS — D472 Monoclonal gammopathy: Secondary | ICD-10-CM | POA: Diagnosis not present

## 2019-11-14 DIAGNOSIS — D509 Iron deficiency anemia, unspecified: Secondary | ICD-10-CM | POA: Diagnosis not present

## 2019-11-14 DIAGNOSIS — Z23 Encounter for immunization: Secondary | ICD-10-CM | POA: Diagnosis not present

## 2019-11-14 DIAGNOSIS — N186 End stage renal disease: Secondary | ICD-10-CM | POA: Diagnosis not present

## 2019-11-14 DIAGNOSIS — Z992 Dependence on renal dialysis: Secondary | ICD-10-CM | POA: Diagnosis not present

## 2019-11-14 DIAGNOSIS — N2581 Secondary hyperparathyroidism of renal origin: Secondary | ICD-10-CM | POA: Diagnosis not present

## 2019-11-14 DIAGNOSIS — E1129 Type 2 diabetes mellitus with other diabetic kidney complication: Secondary | ICD-10-CM | POA: Diagnosis not present

## 2019-11-16 ENCOUNTER — Other Ambulatory Visit: Payer: Medicare Other | Admitting: *Deleted

## 2019-11-16 ENCOUNTER — Other Ambulatory Visit: Payer: Self-pay

## 2019-11-16 DIAGNOSIS — E782 Mixed hyperlipidemia: Secondary | ICD-10-CM

## 2019-11-16 LAB — LIPID PANEL
Chol/HDL Ratio: 2.1 ratio (ref 0.0–5.0)
Cholesterol, Total: 127 mg/dL (ref 100–199)
HDL: 60 mg/dL (ref >39)
LDL Chol Calc (NIH): 52 mg/dL (ref 0–99)
Triglycerides: 74 mg/dL (ref 0–149)
VLDL Cholesterol Cal: 15 mg/dL (ref 5–40)

## 2019-11-16 LAB — HEPATIC FUNCTION PANEL
ALT: 15 IU/L (ref 0–44)
AST: 24 IU/L (ref 0–40)
Albumin: 4.6 g/dL (ref 3.7–4.7)
Alkaline Phosphatase: 65 IU/L (ref 44–121)
Bilirubin Total: 0.2 mg/dL (ref 0.0–1.2)
Bilirubin, Direct: <0.1 mg/dL (ref 0.00–0.40)
Total Protein: 7 g/dL (ref 6.0–8.5)

## 2019-11-17 DIAGNOSIS — N186 End stage renal disease: Secondary | ICD-10-CM | POA: Diagnosis not present

## 2019-11-17 DIAGNOSIS — Z992 Dependence on renal dialysis: Secondary | ICD-10-CM | POA: Diagnosis not present

## 2019-11-17 DIAGNOSIS — D472 Monoclonal gammopathy: Secondary | ICD-10-CM | POA: Diagnosis not present

## 2019-11-17 DIAGNOSIS — N2581 Secondary hyperparathyroidism of renal origin: Secondary | ICD-10-CM | POA: Diagnosis not present

## 2019-11-17 DIAGNOSIS — D509 Iron deficiency anemia, unspecified: Secondary | ICD-10-CM | POA: Diagnosis not present

## 2019-11-17 DIAGNOSIS — E1129 Type 2 diabetes mellitus with other diabetic kidney complication: Secondary | ICD-10-CM | POA: Diagnosis not present

## 2019-11-19 DIAGNOSIS — N2581 Secondary hyperparathyroidism of renal origin: Secondary | ICD-10-CM | POA: Diagnosis not present

## 2019-11-19 DIAGNOSIS — D472 Monoclonal gammopathy: Secondary | ICD-10-CM | POA: Diagnosis not present

## 2019-11-19 DIAGNOSIS — D509 Iron deficiency anemia, unspecified: Secondary | ICD-10-CM | POA: Diagnosis not present

## 2019-11-19 DIAGNOSIS — E1129 Type 2 diabetes mellitus with other diabetic kidney complication: Secondary | ICD-10-CM | POA: Diagnosis not present

## 2019-11-19 DIAGNOSIS — N186 End stage renal disease: Secondary | ICD-10-CM | POA: Diagnosis not present

## 2019-11-19 DIAGNOSIS — Z992 Dependence on renal dialysis: Secondary | ICD-10-CM | POA: Diagnosis not present

## 2019-11-20 ENCOUNTER — Ambulatory Visit: Payer: Medicare Other | Admitting: Nurse Practitioner

## 2019-11-21 DIAGNOSIS — Z992 Dependence on renal dialysis: Secondary | ICD-10-CM | POA: Diagnosis not present

## 2019-11-21 DIAGNOSIS — E1129 Type 2 diabetes mellitus with other diabetic kidney complication: Secondary | ICD-10-CM | POA: Diagnosis not present

## 2019-11-21 DIAGNOSIS — D509 Iron deficiency anemia, unspecified: Secondary | ICD-10-CM | POA: Diagnosis not present

## 2019-11-21 DIAGNOSIS — D472 Monoclonal gammopathy: Secondary | ICD-10-CM | POA: Diagnosis not present

## 2019-11-21 DIAGNOSIS — N2581 Secondary hyperparathyroidism of renal origin: Secondary | ICD-10-CM | POA: Diagnosis not present

## 2019-11-21 DIAGNOSIS — N186 End stage renal disease: Secondary | ICD-10-CM | POA: Diagnosis not present

## 2019-11-23 ENCOUNTER — Encounter: Payer: Self-pay | Admitting: Nurse Practitioner

## 2019-11-23 ENCOUNTER — Other Ambulatory Visit: Payer: Self-pay

## 2019-11-23 ENCOUNTER — Ambulatory Visit (INDEPENDENT_AMBULATORY_CARE_PROVIDER_SITE_OTHER): Payer: Medicare Other | Admitting: Nurse Practitioner

## 2019-11-23 VITALS — BP 140/98 | HR 83 | Temp 97.3°F | Resp 20 | Ht 74.0 in | Wt 201.8 lb

## 2019-11-23 DIAGNOSIS — I2510 Atherosclerotic heart disease of native coronary artery without angina pectoris: Secondary | ICD-10-CM

## 2019-11-23 DIAGNOSIS — I2583 Coronary atherosclerosis due to lipid rich plaque: Secondary | ICD-10-CM

## 2019-11-23 DIAGNOSIS — E1129 Type 2 diabetes mellitus with other diabetic kidney complication: Secondary | ICD-10-CM

## 2019-11-23 DIAGNOSIS — I110 Hypertensive heart disease with heart failure: Secondary | ICD-10-CM | POA: Diagnosis not present

## 2019-11-23 DIAGNOSIS — E785 Hyperlipidemia, unspecified: Secondary | ICD-10-CM

## 2019-11-23 DIAGNOSIS — N185 Chronic kidney disease, stage 5: Secondary | ICD-10-CM

## 2019-11-23 DIAGNOSIS — R600 Localized edema: Secondary | ICD-10-CM

## 2019-11-23 DIAGNOSIS — E1165 Type 2 diabetes mellitus with hyperglycemia: Secondary | ICD-10-CM

## 2019-11-23 DIAGNOSIS — J45909 Unspecified asthma, uncomplicated: Secondary | ICD-10-CM

## 2019-11-23 DIAGNOSIS — IMO0002 Reserved for concepts with insufficient information to code with codable children: Secondary | ICD-10-CM

## 2019-11-23 DIAGNOSIS — I255 Ischemic cardiomyopathy: Secondary | ICD-10-CM | POA: Diagnosis not present

## 2019-11-23 NOTE — Progress Notes (Signed)
Careteam: Patient Care Team: [name], NP as PCP - General (Geriatric Medicine) Jerline Pain, MD as PCP - Cardiology (Cardiology) Daneil Dolin, MD as Consulting Physician (Gastroenterology) Zadie Rhine Clent Demark, MD as Consulting Physician (Ophthalmology) Elmarie Shiley, MD as Consulting Physician (Nephrology) Derek Jack, MD as Consulting Physician (Hematology)  PLACE OF SERVICE:  [place] Directive information Does Patient Have a Medical Advance Directive?: Yes  No Known Allergies  Chief Complaint  Patient presents with  . Medical Management of Chronic Issues    6 Month ollow Up  . Quality Metric Gaps    Hemoglobin A1 C     HPI: Patient is a 69 y.o. male for routine follow up.  Reports blood sugars are higher but its talking meter tells him they are in range. Taking blood pressure before and after he eats.  Reports he has occasional low blood sugar- 53 in the morning, he was sweating.  Taking levemir 15 units.   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 Feels well the other days, had HD for 4 hours. 48 oz of fluid daily fluid restriction.   He is now able to work out and lose weight Treadmill for 30 mins and weights for 20 mins.  Playing golf, quality of life improved on HD.   htn/CAD- continues on norvasc 10 mg daily, coreg 12.5 mg BID, imdur 30 mg daily   COPD- continues on advair AS NEEDED. Does not use albuterol.   Hyperlipidemia- lipitor 40 mg daily and zetia per cardiology   Reports he has hep screening at HD.   Review of Systems:  Review of Systems  Constitutional: Negative for chills, fever and weight loss.  HENT: Negative for tinnitus.   Respiratory: Negative for cough, sputum production and shortness of breath.   Cardiovascular: Negative for chest pain, palpitations and leg swelling.  Gastrointestinal: Negative for abdominal pain, constipation, diarrhea and heartburn.  Genitourinary: Negative for dysuria,  frequency and urgency.  Musculoskeletal: Negative for back pain, falls, joint pain and myalgias.       Cramping in hands occasionally   Skin: Negative.   Neurological: Negative for dizziness and headaches.  Psychiatric/Behavioral: Negative for depression and memory loss. The patient does not have insomnia.     Past Medical History:  Diagnosis Date  . Abnormal stress test 02/10/2015  . Acute on chronic systolic and diastolic heart failure, NYHA class 1 ([place]) 12/31/2014  . Anemia   . Anemia in chronic kidney disease 09/03/2012  . 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 Asthma   . Asthma, chronic 06/04/2012  . Asthma, chronic, mild intermittent, with acute exacerbation 12/22/2015  . Bilateral lower extremity edema 12/22/2015  . CAD (coronary artery disease) 02/18/2015  . Chronic combined systolic (congestive) and diastolic (congestive) heart failure ([place])    a. 12/31/14: 2D ECHO: EF 40-45%, HK of inf myocardium, G1DD, mod MR  . Chronic combined systolic and diastolic CHF (congestive heart failure) ([place]) 02/10/2015  . Chronic systolic heart failure ([place]) 02/25/2015  . CKD (chronic kidney disease), stage III (HCC)    stage 3 kidney disease  . CKD (chronic kidney disease), stage V ([place]) 08/30/2018  . COLONIC POLYPS, HX OF 04/13/2009   Qualifier: Diagnosis of  By: Westly Pam.   . Coronary artery disease    a. LHC 01/2015 - triple vessel CAD (mod oLM, mLAD, severe mRCA, intermediate branch stenosis, CTO of mCx). Plan CABG 02/2015.  Marland Kitchen Demand  ischemia ([place]) 12/31/2014  . DM type 2, uncontrolled, with renal complications ([place]) 6/59/9357  . Dyspnea     due to fluid  . Elevated troponin   . Essential hypertension 10/09/2013  . Folliculitis of perineum 10/01/2012  . Gastric erosion   . GERD (gastroesophageal reflux disease)   . History of blood transfusion   . Hyperlipidemia   . Hyperlipidemia with target LDL less than 100 12/31/2014  . Hypertension   . Hypertensive heart disease with heart  failure ([place]) 02/25/2015  . IDA (iron deficiency anemia) 01/23/2016  . Melena 01/23/2016  . 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 Mitral regurgitation    a. Mild-mod by echo 12/2014.  . Morbid obesity ([place]) 12/29/2014  . Myocardial infarction [facility])    pt. states per [name] he has in the past  . NSVT (nonsustained ventricular tachycardia) ([place])    a. 9 beats during 01/2015 adm. BB titrated.  . Obesity    a. BMI 33  . Obesity (BMI 30-39.9) 05/05/2013  . OSA (obstructive sleep apnea) 01/16/2018   Mild obstructive sleep apnea overall with an AHI of 7.3/h and no significant central sleep apnea. Severe obstructive sleep apnea during REM sleep with an AHI of 34.3/h.  Now on CPAP at 6 cm H2O.   . Other malaise and fatigue 09/03/2012  . 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 PAD (peripheral artery disease) ([place]) 03/02/2015  . Pneumonia   . Sleep apnea    2019, Dr.Turner diagnoised   . Small bowel lesion   . Symptomatic anemia 08/29/2018  . 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 Type II diabetes mellitus ([place])   . Wears dentures    Past Surgical History:  Procedure Laterality Date  . BACK SURGERY    . BASCILIC VEIN TRANSPOSITION Left 11/14/2018   Procedure: FIRST STAGE BASILIC VEIN TRANSPOSITION LEFT ARM;  Surgeon: Serafina Mitchell, MD;  Location: [facility];  Service: Vascular;  Laterality: Left;  . BASCILIC VEIN TRANSPOSITION Left 01/28/2019   Procedure: BASILIC VEIN TRANSPOSITION SECOND STAGE;  Surgeon: Serafina Mitchell, MD;  Location: [facility];  Service: Vascular;  Laterality: Left;  . BONE MARROW BIOPSY    . CARDIAC CATHETERIZATION N/A 02/09/2015   Procedure: Left Heart Cath and Coronary Angiography;  Surgeon: Burnell Blanks, MD;  Location: [facility];  Service: Cardiovascular;  Laterality: N/A;  . COLONOSCOPY  2011   Dr. [name]: Ileocecal valve appeared normal, scattered pancolonic diverticulosis, difficult bowel prep making smaller lesions  potentially missed. Recommended three-year follow-up colonoscopy.  . COLONOSCOPY  2003   Dr. [name]: Suspicious lesion at the ileocecal valve, multiple biopsies benign, pancolonic diverticulosis.  . COLONOSCOPY N/A 02/15/2016   diverticulosis in sigmoid and descending colon, single 5 mm polyp at splenic flexure (Tubular adenoma)  . CORONARY ARTERY BYPASS GRAFT N/A 02/18/2015   Procedure: CORONARY ARTERY BYPASS GRAFTING (CABG) x  four, using bilateral internal mammary arteries and right leg greater saphenous vein harvested endoscopically;  Surgeon: [name], MD;  Location: [facility] OR;  Service: Open Heart Surgery;  Laterality: N/A;  . ESOPHAGOGASTRODUODENOSCOPY N/A 02/15/2016   normal  . EYE SURGERY Right    July 2019   . GIVENS CAPSULE STUDY N/A 01/08/2017   couple of gastric and small bowel eroions in setting of aspirin 81 mg daily but nothing concerning, continue Hematology follow-up  . HERNIA REPAIR  0177   Umbilical  . LUMBAR [place] SURGERY  2006   L4 & L5  . REFRACTIVE  SURGERY Left    2019   . REFRACTIVE SURGERY Right    2019  . TEE WITHOUT CARDIOVERSION N/A 02/18/2015   Procedure: TRANSESOPHAGEAL ECHOCARDIOGRAM (TEE);  Surgeon: [name], MD;  Location: [facility];  Service: Open Heart Surgery;  Laterality: N/A;  . TONSILLECTOMY  1962   Social History:   reports that he quit smoking about 4 years ago. His smoking use included cigars. He quit after 17.00 years of use. He has never used smokeless tobacco. He reports current alcohol use of about 3.0 standard drinks of alcohol per week. He reports that he does not use drugs.  Family History  Problem Relation Age of Onset  . Diabetes Mother   . Heart disease Mother        later in life, age >66  . Heart disease Father        heart failure later in life  . Hypertension Father   . Stroke Sister   . Kidney disease Daughter   . Sudden death Daughter   . Colon cancer Paternal Uncle        Passed from colon CA  . Heart attack Neg Hx      Medications: Patient's Medications  New Prescriptions   No medications on file  Previous Medications   ACETAMINOPHEN (TYLENOL) 500 MG TABLET    Take 1,000 mg by mouth 2 (two) times daily as needed for moderate pain.   ADVAIR DISKUS 100-50 MCG/DOSE AEPB    SMARTSIG:1 Puff(s) Via Inhaler Twice Daily PRN   AMLODIPINE (NORVASC) 10 MG TABLET    Take 1 tablet by mouth once daily   ASPIRIN EC 81 MG TABLET    Take 1 tablet (81 mg total) by mouth daily.   ATORVASTATIN (LIPITOR) 40 MG TABLET    Take 1 tablet (40 mg total) by mouth every evening.   CARVEDILOL (COREG) 12.5 MG TABLET    Take 12.5 mg by mouth 2 (two) times daily.   CHOLECALCIFEROL (VITAMIN D3) 25 MCG (1000 UT) CAPS    Take 2 capsules by mouth daily.   CINNAMON 500 MG CAPSULE    Take 1,000 mg by mouth daily.    COENZYME Q10 10 MG CAPSULE    Take 10 mg by mouth daily.   EPOETIN ALFA-EPBX (RETACRIT) 16109 UNIT/ML INJECTION    20,000 Units every 14 (fourteen) days.    EZETIMIBE (ZETIA) 10 MG TABLET    Take 1 tablet (10 mg total) by mouth daily.   FLUTICASONE (FLONASE) 50 MCG/ACT NASAL SPRAY    Place 2 sprays into both nostrils daily as needed for allergies or rhinitis (SEASONAL ALLERGIES).   FUROSEMIDE (LASIX) 40 MG TABLET    Take 40 mg by mouth. On S-M-W-F Takes  1 tablet by mouth once d   GLUCOSE BLOOD (ONE TOUCH ULTRA TEST) TEST STRIP    Use to test blood sugar three times daily. Dx: E11.21   HYDRALAZINE (APRESOLINE) 50 MG TABLET    Take 25 mg by mouth daily. Taking just one tablet a day - 25 mg   INSULIN DETEMIR (LEVEMIR) 100 UNIT/ML INJECTION    Inject 0.15 mLs (15 Units total) into the skin daily. In the morning   ISOSORBIDE MONONITRATE (IMDUR) 30 MG 24 HR TABLET    TAKE 1 TABLET BY MOUTH ONCE DAILY -- PATIENT NEEDS APPOINTMENT FOR FUTURE REFILLS   LAMISIL AT 1 % CREAM    SMARTSIG:Sparingly Topical PRN   MONTELUKAST (SINGULAIR) 10 MG TABLET    TAKE 1 TABLET  BY MOUTH AT BEDTIME   MULTIPLE VITAMIN (MULTIVITAMIN WITH MINERALS) TABS  TABLET    Take 1 tablet by mouth daily.   NITROGLYCERIN (NITROSTAT) 0.4 MG SL TABLET    Place 1 tablet (0.4 mg total) under the tongue every 5 (five) minutes as needed for chest pain.   PROAIR HFA 108 (90 BASE) MCG/ACT INHALER    INHALE 2 PUFFS BY MOUTH EVERY 6 HOURS AS NEEDED FOR SHORTNESS OF BREATH  Modified Medications   No medications on file  Discontinued Medications   FUROSEMIDE (LASIX) 40 MG TABLET    Take 2 tablets by mouth twice daily    Physical Exam:  Vitals:   11/23/19 0920  BP: (!) 140/98  Pulse: 83  Resp: 20  Temp: (!) 97.3 F (36.3 C)  TempSrc: Temporal  SpO2: 95%  Weight: 201 lb 12.8 oz (91.5 kg)  Height: $Remove'6\' 2"'VzGciHv$  (1.88 m)   Body mass index is 25.91 kg/m. Wt Readings from Last 3 Encounters:  11/23/19 201 lb 12.8 oz (91.5 kg)  10/12/19 196 lb (88.9 kg)  10/05/19 198 lb (89.8 kg)    Physical Exam Constitutional:      General: He is not in acute distress.    Appearance: He is well-developed. He is not diaphoretic.  HENT:     Head: Normocephalic and atraumatic.     Mouth/Throat:     Pharynx: No oropharyngeal exudate.  Eyes:     Conjunctiva/sclera: Conjunctivae normal.     Pupils: Pupils are equal, round, and reactive to light.  Cardiovascular:     Rate and Rhythm: Normal rate and regular rhythm.     Heart sounds: Normal heart sounds.  Pulmonary:     Effort: Pulmonary effort is normal.     Breath sounds: Normal breath sounds.  Abdominal:     General: Bowel sounds are normal.     Palpations: Abdomen is soft.  Musculoskeletal:        General: No tenderness.     Cervical back: Normal range of motion and neck supple.  Skin:    General: Skin is warm and dry.  Neurological:     Mental Status: He is alert and oriented to person, place, and time.    Labs reviewed: Basic Metabolic Panel: Recent Labs    12/17/18 1134 01/15/19 1044 02/09/19 1338 02/10/19 1231 06/15/19 1207 06/15/19 1207 07/15/19 1039 07/15/19 1039 08/12/19 0943 08/12/19 1054  10/05/19 0916 10/05/19 1345  NA 139   < > 137   < > 138   < > 135   < >  --  135 135 133*  K 4.1   < > 4.1   < > 4.3   < > 3.9   < >  --  3.9 4.4 3.9  CL 102   < > 100   < > 103   < > 98   < >  --  100 93* 94*  CO2 23   < > 25   < > 22   < > 21*   < >  --  21* 24 24  GLUCOSE 150*   < > 256*   < > 219*   < > 179*   < >  --  249* 250* 231*  BUN 137*   < > 124*   < > 146*   < > 132*   < >  --  138* 74* 83*  CREATININE 7.07*   < > 6.65*   < > 6.99*   < >  6.93*   < >  --  7.05* 6.60* 7.00*  CALCIUM 8.9   < > 9.2   < > 8.8*   < > 8.8*   < >  --  8.9 9.2 9.0  MG 2.0  --  2.0  --   --   --   --   --  2.0  --   --   --   PHOS 5.4*   < > 4.4   < > 5.1*  --  6.4*  --   --  5.2*  --   --    < > = values in this interval not displayed.   Liver Function Tests: Recent Labs    10/05/19 0916 10/05/19 1345 11/16/19 0930  AST $Re'20 22 24  'dzM$ ALT $R'14 18 15  'fI$ ALKPHOS 70 60 65  BILITOT 0.2 0.3 0.2  PROT 6.5 6.8 7.0  ALBUMIN 4.1 3.8 4.6   No results for input(s): LIPASE, AMYLASE in the last 8760 hours. No results for input(s): AMMONIA in the last 8760 hours. CBC: Recent Labs    08/12/19 0943 08/12/19 0957 08/26/19 0843 10/05/19 0916 10/05/19 1345  WBC 5.1  --   --  5.3 5.5  NEUTROABS 3.6  --   --  3.0 3.2  HGB 9.8*   < > 10.0* 10.8* 11.0*  HCT 29.9*  --   --  32.6* 34.3*  MCV 99.0  --   --  98* 99.4  PLT 173  --   --  212 218   < > = values in this interval not displayed.   Lipid Panel: Recent Labs    01/07/19 1108 10/05/19 0916 11/16/19 0930  CHOL 114 163 127  HDL 30* 50 60  LDLCALC 62 99 52  TRIG 132 71 74  CHOLHDL 3.8 3.3 2.1   TSH: No results for input(s): TSH in the last 8760 hours. A1C: Lab Results  Component Value Date   HGBA1C 7.5 (H) 05/20/2019     Assessment/Plan 1. DM type 2, uncontrolled, with renal complications ([place]) -reports he feels like the blood sugars are not well controlled but continue to make dietary changes and exercise.  Taking levemir 15 units  daily -Encouraged dietary compliance, routine foot care/monitoring and to keep up with diabetic eye exams through ophthalmology  - Hemoglobin A1c  2. Moderate asthma without complication, unspecified whether persistent Well controlled. Not current taking any inhalers for asthma, without any flares or shortness of breath.   3. Bilateral lower extremity edema -has resolved now that on dialysis  4. Coronary artery disease due to lipid rich plaque Stable, without chest pains. Continues on lipitor 40 mg daily with zetia per cardiology.  5. CKD (chronic kidney disease), stage V ([place]) -on HD Tuesday, Thursday and Saturday. Reports feeling much better since staring dialysis.   6. Hypertensive heart disease with heart failure ([place]) -stable on current regimen.   7. Hyperlipidemia with target LDL less than 100 Continues on lipitor and zetia.   Next appt: 4 months.  Henry Carroll. [place], [facility] [phone]

## 2019-11-23 NOTE — Patient Instructions (Signed)
To check and see if you have been screened for Hepatitis C through dialysis.

## 2019-11-24 DIAGNOSIS — D472 Monoclonal gammopathy: Secondary | ICD-10-CM | POA: Diagnosis not present

## 2019-11-24 DIAGNOSIS — Z992 Dependence on renal dialysis: Secondary | ICD-10-CM | POA: Diagnosis not present

## 2019-11-24 DIAGNOSIS — E1129 Type 2 diabetes mellitus with other diabetic kidney complication: Secondary | ICD-10-CM | POA: Diagnosis not present

## 2019-11-24 DIAGNOSIS — N186 End stage renal disease: Secondary | ICD-10-CM | POA: Diagnosis not present

## 2019-11-24 DIAGNOSIS — N2581 Secondary hyperparathyroidism of renal origin: Secondary | ICD-10-CM | POA: Diagnosis not present

## 2019-11-24 DIAGNOSIS — D509 Iron deficiency anemia, unspecified: Secondary | ICD-10-CM | POA: Diagnosis not present

## 2019-11-24 LAB — HEMOGLOBIN A1C
Hgb A1c MFr Bld: 8.6 % of total Hgb — ABNORMAL HIGH (ref <5.7)
Mean Plasma Glucose: 200 (calc)
eAG (mmol/L): 11.1 (calc)

## 2019-11-26 DIAGNOSIS — D472 Monoclonal gammopathy: Secondary | ICD-10-CM | POA: Diagnosis not present

## 2019-11-26 DIAGNOSIS — E1129 Type 2 diabetes mellitus with other diabetic kidney complication: Secondary | ICD-10-CM | POA: Diagnosis not present

## 2019-11-26 DIAGNOSIS — D509 Iron deficiency anemia, unspecified: Secondary | ICD-10-CM | POA: Diagnosis not present

## 2019-11-26 DIAGNOSIS — N2581 Secondary hyperparathyroidism of renal origin: Secondary | ICD-10-CM | POA: Diagnosis not present

## 2019-11-26 DIAGNOSIS — N186 End stage renal disease: Secondary | ICD-10-CM | POA: Diagnosis not present

## 2019-11-26 DIAGNOSIS — Z992 Dependence on renal dialysis: Secondary | ICD-10-CM | POA: Diagnosis not present

## 2019-11-28 DIAGNOSIS — N186 End stage renal disease: Secondary | ICD-10-CM | POA: Diagnosis not present

## 2019-11-28 DIAGNOSIS — D472 Monoclonal gammopathy: Secondary | ICD-10-CM | POA: Diagnosis not present

## 2019-11-28 DIAGNOSIS — D509 Iron deficiency anemia, unspecified: Secondary | ICD-10-CM | POA: Diagnosis not present

## 2019-11-28 DIAGNOSIS — Z992 Dependence on renal dialysis: Secondary | ICD-10-CM | POA: Diagnosis not present

## 2019-11-28 DIAGNOSIS — N2581 Secondary hyperparathyroidism of renal origin: Secondary | ICD-10-CM | POA: Diagnosis not present

## 2019-11-28 DIAGNOSIS — E1129 Type 2 diabetes mellitus with other diabetic kidney complication: Secondary | ICD-10-CM | POA: Diagnosis not present

## 2019-12-01 DIAGNOSIS — N186 End stage renal disease: Secondary | ICD-10-CM | POA: Diagnosis not present

## 2019-12-01 DIAGNOSIS — D509 Iron deficiency anemia, unspecified: Secondary | ICD-10-CM | POA: Diagnosis not present

## 2019-12-01 DIAGNOSIS — Z992 Dependence on renal dialysis: Secondary | ICD-10-CM | POA: Diagnosis not present

## 2019-12-01 DIAGNOSIS — D472 Monoclonal gammopathy: Secondary | ICD-10-CM | POA: Diagnosis not present

## 2019-12-01 DIAGNOSIS — E1129 Type 2 diabetes mellitus with other diabetic kidney complication: Secondary | ICD-10-CM | POA: Diagnosis not present

## 2019-12-01 DIAGNOSIS — N2581 Secondary hyperparathyroidism of renal origin: Secondary | ICD-10-CM | POA: Diagnosis not present

## 2019-12-02 ENCOUNTER — Telehealth (INDEPENDENT_AMBULATORY_CARE_PROVIDER_SITE_OTHER): Payer: Medicare Other | Admitting: Nurse Practitioner

## 2019-12-02 ENCOUNTER — Other Ambulatory Visit: Payer: Self-pay

## 2019-12-02 ENCOUNTER — Encounter: Payer: Self-pay | Admitting: Nurse Practitioner

## 2019-12-02 VITALS — BP 146/80

## 2019-12-02 DIAGNOSIS — IMO0002 Reserved for concepts with insufficient information to code with codable children: Secondary | ICD-10-CM

## 2019-12-02 DIAGNOSIS — I255 Ischemic cardiomyopathy: Secondary | ICD-10-CM | POA: Diagnosis not present

## 2019-12-02 DIAGNOSIS — E1165 Type 2 diabetes mellitus with hyperglycemia: Secondary | ICD-10-CM

## 2019-12-02 DIAGNOSIS — E1129 Type 2 diabetes mellitus with other diabetic kidney complication: Secondary | ICD-10-CM

## 2019-12-02 NOTE — Progress Notes (Signed)
This service is provided via telemedicine  No vital signs collected/recorded due to the encounter was a telemedicine visit.   Location of patient (ex: home, work):  Home  Patient consents to a telephone visit:  Yes, see telephone encounter dated 07/23/2019 with annual consent   Location of the provider (ex: office, home): [facility], Office   Name of any referring provider: n/a  Names of all persons participating in the telemedicine service and their role in the encounter:  S.Chrae B/CMA, Sherrie Mustache, NP, and Patient   Time spent on call: 6 min with medical assistant      Careteam: Patient Care Team: [name], NP as PCP - General (Geriatric Medicine) Jerline Pain, MD as PCP - Cardiology (Cardiology) [name], MD as Consulting Physician (Gastroenterology) Zadie Rhine Clent Demark, MD as Consulting Physician (Ophthalmology) Elmarie Shiley, MD as Consulting Physician (Nephrology) Derek Jack, MD as Consulting Physician (Hematology)  Advanced Directive information    No Known Allergies  Chief Complaint  Patient presents with  . Follow-up    Blood Pressure Follow-up. Telephone/video visit      HPI: Patient is a 69 y.o. male for follow up on blood sugar.  Pt reports he had lost his meter and he lost weight so was not as mindful.  He is taking levemir 18 units TWICE daily- not once daily. He started this recently when A1c was high.  Reports now blood sugars are back in the normal range with a few lows in the 50s and 60s.  Reports he is getting prescription from [facility]- we have not refilled since November. Reports he has plenty of insulin at home.  Reports he is feeling well.  Eating well and drinking well. Good appetite.  No changes in vision or neuropathy.   Review of Systems:  Review of Systems  Constitutional: Negative for chills, fever and weight loss.  HENT: Negative for tinnitus.   Respiratory: Negative for cough,  sputum production and shortness of breath.   Cardiovascular: Negative for chest pain, palpitations and leg swelling.  Gastrointestinal: Negative for abdominal pain, constipation, diarrhea and heartburn.  Genitourinary:       Dialysis pt  Musculoskeletal: Negative for back pain, falls, joint pain and myalgias.  Skin: Negative.   Neurological: Negative for dizziness and headaches.    Past Medical History:  Diagnosis Date  . Abnormal stress test 02/10/2015  . Acute on chronic systolic and diastolic heart failure, NYHA class 1 ([place]) 12/31/2014  . Anemia   . Anemia in chronic kidney disease 09/03/2012  . 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 Asthma   . Asthma, chronic 06/04/2012  . Asthma, chronic, mild intermittent, with acute exacerbation 12/22/2015  . Bilateral lower extremity edema 12/22/2015  . CAD (coronary artery disease) 02/18/2015  . Chronic combined systolic (congestive) and diastolic (congestive) heart failure ([place])    a. 12/31/14: 2D ECHO: EF 40-45%, HK of inf myocardium, G1DD, mod MR  . Chronic combined systolic and diastolic CHF (congestive heart failure) ([place]) 02/10/2015  . Chronic systolic heart failure ([place]) 02/25/2015  . CKD (chronic kidney disease), stage III (HCC)    stage 3 kidney disease  . CKD (chronic kidney disease), stage V ([place]) 08/30/2018  . COLONIC POLYPS, HX OF 04/13/2009   Qualifier: Diagnosis of  By: Westly Pam.   . Coronary artery disease    a. LHC 01/2015 - triple vessel CAD (mod oLM, mLAD, severe mRCA, intermediate branch  stenosis, CTO of mCx). Plan CABG 02/2015.  Marland Kitchen Demand ischemia (HCC) 12/31/2014  . 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 Dyspnea     due to fluid  . Elevated troponin   . Essential hypertension 10/09/2013  . Folliculitis of perineum 10/01/2012  . Gastric erosion   . GERD (gastroesophageal reflux disease)   . History of blood transfusion   . Hyperlipidemia   . Hyperlipidemia with target LDL less than 100  12/31/2014  . Hypertension   . Hypertensive heart disease with heart failure (HCC) 02/25/2015  . IDA (iron deficiency anemia) 01/23/2016  . Melena 01/23/2016  . 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 Mitral regurgitation    a. Mild-mod by echo 12/2014.  . Morbid obesity (HCC) 12/29/2014  . Myocardial infarction [facility])    pt. states per Dr. Laneta Simmers he has in the past  . NSVT (nonsustained ventricular tachycardia) (HCC)    a. 9 beats during 01/2015 adm. BB titrated.  . Obesity    a. BMI 33  . Obesity (BMI 30-39.9) 05/05/2013  . OSA (obstructive sleep apnea) 01/16/2018   Mild obstructive sleep apnea overall with an AHI of 7.3/h and no significant central sleep apnea. Severe obstructive sleep apnea during REM sleep with an AHI of 34.3/h.  Now on CPAP at 6 cm H2O.   . Other malaise and fatigue 09/03/2012  . 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 PAD (peripheral artery disease) (HCC) 03/02/2015  . Pneumonia   . Sleep apnea    2019, Dr.Turner diagnoised   . Small bowel lesion   . Symptomatic anemia 08/29/2018  . 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 Type II diabetes mellitus (HCC)   . Wears dentures    Past Surgical History:  Procedure Laterality Date  . BACK SURGERY    . BASCILIC VEIN TRANSPOSITION Left 11/14/2018   Procedure: FIRST STAGE BASILIC VEIN TRANSPOSITION LEFT ARM;  Surgeon: Nada Libman, MD;  Location: [facility] OR;  Service: Vascular;  Laterality: Left;  . BASCILIC VEIN TRANSPOSITION Left 01/28/2019   Procedure: BASILIC VEIN TRANSPOSITION SECOND STAGE;  Surgeon: Nada Libman, MD;  Location: [facility] OR;  Service: Vascular;  Laterality: Left;  . BONE MARROW BIOPSY    . CARDIAC CATHETERIZATION N/A 02/09/2015   Procedure: Left Heart Cath and Coronary Angiography;  Surgeon: Kathleene Hazel, MD;  Location: [facility] INVASIVE CV LAB;  Service: Cardiovascular;  Laterality: N/A;  . COLONOSCOPY  2011   Dr. Jena Gauss: Ileocecal valve appeared normal, scattered  pancolonic diverticulosis, difficult bowel prep making smaller lesions potentially missed. Recommended three-year follow-up colonoscopy.  . COLONOSCOPY  2003   Dr. Jena Gauss: Suspicious lesion at the ileocecal valve, multiple biopsies benign, pancolonic diverticulosis.  . COLONOSCOPY N/A 02/15/2016   diverticulosis in sigmoid and descending colon, single 5 mm polyp at splenic flexure (Tubular adenoma)  . CORONARY ARTERY BYPASS GRAFT N/A 02/18/2015   Procedure: CORONARY ARTERY BYPASS GRAFTING (CABG) x  four, using bilateral internal mammary arteries and right leg greater saphenous vein harvested endoscopically;  Surgeon: Alleen Borne, MD;  Location: [facility] OR;  Service: Open Heart Surgery;  Laterality: N/A;  . ESOPHAGOGASTRODUODENOSCOPY N/A 02/15/2016   normal  . EYE SURGERY Right    July 2019   . GIVENS CAPSULE STUDY N/A 01/08/2017   couple of gastric and small bowel eroions in setting of aspirin 81 mg daily but nothing concerning, continue Hematology follow-up  . HERNIA REPAIR  1990   Umbilical  . LUMBAR DISC SURGERY  2006   L4 & L5  . REFRACTIVE SURGERY Left    2019   . REFRACTIVE SURGERY Right    2019  . TEE WITHOUT CARDIOVERSION N/A 02/18/2015   Procedure: TRANSESOPHAGEAL ECHOCARDIOGRAM (TEE);  Surgeon: Alleen Borne, MD;  Location: [facility] OR;  Service: Open Heart Surgery;  Laterality: N/A;  . TONSILLECTOMY  1962   Social History:   reports that he quit smoking about 4 years ago. His smoking use included cigars. He quit after 17.00 years of use. He has never used smokeless tobacco. He reports current alcohol use of about 3.0 standard drinks of alcohol per week. He reports that he does not use drugs.  Family History  Problem Relation Age of Onset  . Diabetes Mother   . Heart disease Mother        later in life, age >12  . Heart disease Father        heart failure later in life  . Hypertension Father   . Stroke Sister   . Kidney disease Daughter   . Sudden death Daughter   . Colon cancer  Paternal Uncle        Passed from colon CA  . Heart attack Neg Hx     Medications: Patient's Medications  New Prescriptions   No medications on file  Previous Medications   ACETAMINOPHEN (TYLENOL) 500 MG TABLET    Take 1,000 mg by mouth 2 (two) times daily as needed for moderate pain.   ADVAIR DISKUS 100-50 MCG/DOSE AEPB    SMARTSIG:1 Puff(s) Via Inhaler Twice Daily PRN   AMLODIPINE (NORVASC) 10 MG TABLET    Take 1 tablet by mouth once daily   ASPIRIN EC 81 MG TABLET    Take 1 tablet (81 mg total) by mouth daily.   ATORVASTATIN (LIPITOR) 40 MG TABLET    Take 1 tablet (40 mg total) by mouth every evening.   CARVEDILOL (COREG) 12.5 MG TABLET    Take 12.5 mg by mouth 2 (two) times daily.   CHOLECALCIFEROL (VITAMIN D3) 25 MCG (1000 UT) CAPS    Take 2 capsules by mouth daily.   CINNAMON 500 MG CAPSULE    Take 1,000 mg by mouth daily.    COENZYME Q10 10 MG CAPSULE    Take 10 mg by mouth daily.   EPOETIN ALFA-EPBX (RETACRIT) 41806 UNIT/ML INJECTION    20,000 Units every 14 (fourteen) days.    EZETIMIBE (ZETIA) 10 MG TABLET    Take 1 tablet (10 mg total) by mouth daily.   FLUTICASONE (FLONASE) 50 MCG/ACT NASAL SPRAY    Place 2 sprays into both nostrils daily as needed for allergies or rhinitis (SEASONAL ALLERGIES).   FUROSEMIDE (LASIX) 40 MG TABLET    Take 40 mg by mouth. On S-M-W-F Takes  1 tablet by mouth once d   GLUCOSE BLOOD (ONE TOUCH ULTRA TEST) TEST STRIP    Use to test blood sugar three times daily. Dx: E11.21   HYDRALAZINE (APRESOLINE) 50 MG TABLET    Take 25 mg by mouth daily. Taking just one tablet a day - 25 mg   INSULIN DETEMIR (LEVEMIR) 100 UNIT/ML INJECTION    Inject 0.15 mLs (15 Units total) into the skin daily. In the morning   ISOSORBIDE MONONITRATE (IMDUR) 30 MG 24 HR TABLET    TAKE 1 TABLET BY MOUTH ONCE DAILY -- PATIENT NEEDS APPOINTMENT FOR FUTURE REFILLS   LAMISIL AT 1 % CREAM    SMARTSIG:Sparingly Topical PRN   MONTELUKAST (SINGULAIR)  10 MG TABLET    TAKE 1 TABLET BY  MOUTH AT BEDTIME   MULTIPLE VITAMIN (MULTIVITAMIN WITH MINERALS) TABS TABLET    Take 1 tablet by mouth daily.   NITROGLYCERIN (NITROSTAT) 0.4 MG SL TABLET    Place 1 tablet (0.4 mg total) under the tongue every 5 (five) minutes as needed for chest pain.   PROAIR HFA 108 (90 BASE) MCG/ACT INHALER    INHALE 2 PUFFS BY MOUTH EVERY 6 HOURS AS NEEDED FOR SHORTNESS OF BREATH   SEVELAMER CARBONATE (RENVELA) 800 MG TABLET    Take 800 mg by mouth 3 (three) times daily with meals.  Modified Medications   No medications on file  Discontinued Medications   No medications on file    Physical Exam:  Vitals:   12/02/19 1026  BP: (!) 146/80   There is no height or weight on file to calculate BMI. Wt Readings from Last 3 Encounters:  11/23/19 201 lb 12.8 oz (91.5 kg)  10/12/19 196 lb (88.9 kg)  10/05/19 198 lb (89.8 kg)      Labs reviewed: Basic Metabolic Panel: Recent Labs    12/17/18 1134 01/15/19 1044 02/09/19 1338 02/10/19 1231 06/15/19 1207 06/15/19 1207 07/15/19 1039 07/15/19 1039 08/12/19 0943 08/12/19 1054 10/05/19 0916 10/05/19 1345  NA 139   < > 137   < > 138   < > 135   < >  --  135 135 133*  K 4.1   < > 4.1   < > 4.3   < > 3.9   < >  --  3.9 4.4 3.9  CL 102   < > 100   < > 103   < > 98   < >  --  100 93* 94*  CO2 23   < > 25   < > 22   < > 21*   < >  --  21* 24 24  GLUCOSE 150*   < > 256*   < > 219*   < > 179*   < >  --  249* 250* 231*  BUN 137*   < > 124*   < > 146*   < > 132*   < >  --  138* 74* 83*  CREATININE 7.07*   < > 6.65*   < > 6.99*   < > 6.93*   < >  --  7.05* 6.60* 7.00*  CALCIUM 8.9   < > 9.2   < > 8.8*   < > 8.8*   < >  --  8.9 9.2 9.0  MG 2.0  --  2.0  --   --   --   --   --  2.0  --   --   --   PHOS 5.4*   < > 4.4   < > 5.1*  --  6.4*  --   --  5.2*  --   --    < > = values in this interval not displayed.   Liver Function Tests: Recent Labs    10/05/19 0916 10/05/19 1345 11/16/19 0930  AST $Re'20 22 24  'dFY$ ALT $R'14 18 15  'Rp$ ALKPHOS 70 60 65  BILITOT 0.2  0.3 0.2  PROT 6.5 6.8 7.0  ALBUMIN 4.1 3.8 4.6   No results for input(s): LIPASE, AMYLASE in the last 8760 hours. No results for input(s): AMMONIA in the last 8760 hours. CBC: Recent Labs    08/12/19 0943 08/12/19 0957 08/26/19 0843 10/05/19  5170 10/05/19 1345  WBC 5.1  --   --  5.3 5.5  NEUTROABS 3.6  --   --  3.0 3.2  HGB 9.8*   < > 10.0* 10.8* 11.0*  HCT 29.9*  --   --  32.6* 34.3*  MCV 99.0  --   --  98* 99.4  PLT 173  --   --  212 218   < > = values in this interval not displayed.   Lipid Panel: Recent Labs    01/07/19 1108 10/05/19 0916 11/16/19 0930  CHOL 114 163 127  HDL 30* 50 60  LDLCALC 62 99 52  TRIG 132 71 74  CHOLHDL 3.8 3.3 2.1   TSH: No results for input(s): TSH in the last 8760 hours. A1C: Lab Results  Component Value Date   HGBA1C 8.6 (H) 11/23/2019     Assessment/Plan 1. DM type 2, uncontrolled, with renal complications (HCC) Pt increased levemir to 18 units twice daily vs once daily and having episodes of hypoglycemia- educated to reduce levemir to ONCE daily at this time. He is also more aware of intake and will plan to monitor sugars closer for better control. To monitor for hypoglycemia -to notify if blood sugars remain uncontrolled or having lows.     Next appt: 4 months (as scheduled) Deysy Schabel K. Harle Battiest  [facility] [phone]    Virtual Visit via Deloris Ping  I connected with patient on 12/02/19 at 10:30 AM EDT by video and verified that I am speaking with the correct person using two identifiers.  Location: Patient: home Provider: [place]   I discussed the limitations, risks, security and privacy concerns of performing an evaluation and management service by telephone and the availability of in person appointments. I also discussed with the patient that there may be a patient responsible charge related to this service. The patient expressed understanding and agreed to proceed.   I discussed the  assessment and treatment plan with the patient. The patient was provided an opportunity to ask questions and all were answered. The patient agreed with the plan and demonstrated an understanding of the instructions.   The patient was advised to call back or seek an in-person evaluation if the symptoms worsen or if the condition fails to improve as anticipated.  I provided 18 minutes of non-face-to-face time during this encounter.  Carlos American. Harle Battiest Avs printed and mailed

## 2019-12-03 DIAGNOSIS — N186 End stage renal disease: Secondary | ICD-10-CM | POA: Diagnosis not present

## 2019-12-03 DIAGNOSIS — D509 Iron deficiency anemia, unspecified: Secondary | ICD-10-CM | POA: Diagnosis not present

## 2019-12-03 DIAGNOSIS — N2581 Secondary hyperparathyroidism of renal origin: Secondary | ICD-10-CM | POA: Diagnosis not present

## 2019-12-03 DIAGNOSIS — D472 Monoclonal gammopathy: Secondary | ICD-10-CM | POA: Diagnosis not present

## 2019-12-03 DIAGNOSIS — Z992 Dependence on renal dialysis: Secondary | ICD-10-CM | POA: Diagnosis not present

## 2019-12-03 DIAGNOSIS — E039 Hypothyroidism, unspecified: Secondary | ICD-10-CM | POA: Diagnosis not present

## 2019-12-03 DIAGNOSIS — E1129 Type 2 diabetes mellitus with other diabetic kidney complication: Secondary | ICD-10-CM | POA: Diagnosis not present

## 2019-12-03 MED ORDER — INSULIN DETEMIR 100 UNIT/ML ~~LOC~~ SOLN: 18.0000 [IU] | Freq: Every day | SUBCUTANEOUS | 3 refills | Status: DC

## 2019-12-05 DIAGNOSIS — D509 Iron deficiency anemia, unspecified: Secondary | ICD-10-CM | POA: Diagnosis not present

## 2019-12-05 DIAGNOSIS — N2581 Secondary hyperparathyroidism of renal origin: Secondary | ICD-10-CM | POA: Diagnosis not present

## 2019-12-05 DIAGNOSIS — E1129 Type 2 diabetes mellitus with other diabetic kidney complication: Secondary | ICD-10-CM | POA: Diagnosis not present

## 2019-12-05 DIAGNOSIS — Z992 Dependence on renal dialysis: Secondary | ICD-10-CM | POA: Diagnosis not present

## 2019-12-05 DIAGNOSIS — D472 Monoclonal gammopathy: Secondary | ICD-10-CM | POA: Diagnosis not present

## 2019-12-05 DIAGNOSIS — N186 End stage renal disease: Secondary | ICD-10-CM | POA: Diagnosis not present

## 2019-12-08 DIAGNOSIS — Z992 Dependence on renal dialysis: Secondary | ICD-10-CM | POA: Diagnosis not present

## 2019-12-08 DIAGNOSIS — N186 End stage renal disease: Secondary | ICD-10-CM | POA: Diagnosis not present

## 2019-12-08 DIAGNOSIS — D509 Iron deficiency anemia, unspecified: Secondary | ICD-10-CM | POA: Diagnosis not present

## 2019-12-08 DIAGNOSIS — N2581 Secondary hyperparathyroidism of renal origin: Secondary | ICD-10-CM | POA: Diagnosis not present

## 2019-12-08 DIAGNOSIS — D472 Monoclonal gammopathy: Secondary | ICD-10-CM | POA: Diagnosis not present

## 2019-12-08 DIAGNOSIS — E1129 Type 2 diabetes mellitus with other diabetic kidney complication: Secondary | ICD-10-CM | POA: Diagnosis not present

## 2019-12-10 DIAGNOSIS — D509 Iron deficiency anemia, unspecified: Secondary | ICD-10-CM | POA: Diagnosis not present

## 2019-12-10 DIAGNOSIS — Z992 Dependence on renal dialysis: Secondary | ICD-10-CM | POA: Diagnosis not present

## 2019-12-10 DIAGNOSIS — E1129 Type 2 diabetes mellitus with other diabetic kidney complication: Secondary | ICD-10-CM | POA: Diagnosis not present

## 2019-12-10 DIAGNOSIS — D472 Monoclonal gammopathy: Secondary | ICD-10-CM | POA: Diagnosis not present

## 2019-12-10 DIAGNOSIS — N186 End stage renal disease: Secondary | ICD-10-CM | POA: Diagnosis not present

## 2019-12-10 DIAGNOSIS — N2581 Secondary hyperparathyroidism of renal origin: Secondary | ICD-10-CM | POA: Diagnosis not present

## 2019-12-12 DIAGNOSIS — N186 End stage renal disease: Secondary | ICD-10-CM | POA: Diagnosis not present

## 2019-12-12 DIAGNOSIS — E1129 Type 2 diabetes mellitus with other diabetic kidney complication: Secondary | ICD-10-CM | POA: Diagnosis not present

## 2019-12-12 DIAGNOSIS — D472 Monoclonal gammopathy: Secondary | ICD-10-CM | POA: Diagnosis not present

## 2019-12-12 DIAGNOSIS — D509 Iron deficiency anemia, unspecified: Secondary | ICD-10-CM | POA: Diagnosis not present

## 2019-12-12 DIAGNOSIS — Z992 Dependence on renal dialysis: Secondary | ICD-10-CM | POA: Diagnosis not present

## 2019-12-12 DIAGNOSIS — N2581 Secondary hyperparathyroidism of renal origin: Secondary | ICD-10-CM | POA: Diagnosis not present

## 2019-12-14 DIAGNOSIS — E1122 Type 2 diabetes mellitus with diabetic chronic kidney disease: Secondary | ICD-10-CM | POA: Diagnosis not present

## 2019-12-14 DIAGNOSIS — N186 End stage renal disease: Secondary | ICD-10-CM | POA: Diagnosis not present

## 2019-12-14 DIAGNOSIS — Z992 Dependence on renal dialysis: Secondary | ICD-10-CM | POA: Diagnosis not present

## 2019-12-15 DIAGNOSIS — N186 End stage renal disease: Secondary | ICD-10-CM | POA: Diagnosis not present

## 2019-12-15 DIAGNOSIS — Z23 Encounter for immunization: Secondary | ICD-10-CM | POA: Diagnosis not present

## 2019-12-15 DIAGNOSIS — D472 Monoclonal gammopathy: Secondary | ICD-10-CM | POA: Diagnosis not present

## 2019-12-15 DIAGNOSIS — Z992 Dependence on renal dialysis: Secondary | ICD-10-CM | POA: Diagnosis not present

## 2019-12-15 DIAGNOSIS — E1129 Type 2 diabetes mellitus with other diabetic kidney complication: Secondary | ICD-10-CM | POA: Diagnosis not present

## 2019-12-15 DIAGNOSIS — N2581 Secondary hyperparathyroidism of renal origin: Secondary | ICD-10-CM | POA: Diagnosis not present

## 2019-12-17 DIAGNOSIS — E1129 Type 2 diabetes mellitus with other diabetic kidney complication: Secondary | ICD-10-CM | POA: Diagnosis not present

## 2019-12-17 DIAGNOSIS — Z23 Encounter for immunization: Secondary | ICD-10-CM | POA: Diagnosis not present

## 2019-12-17 DIAGNOSIS — N186 End stage renal disease: Secondary | ICD-10-CM | POA: Diagnosis not present

## 2019-12-17 DIAGNOSIS — Z992 Dependence on renal dialysis: Secondary | ICD-10-CM | POA: Diagnosis not present

## 2019-12-17 DIAGNOSIS — D472 Monoclonal gammopathy: Secondary | ICD-10-CM | POA: Diagnosis not present

## 2019-12-17 DIAGNOSIS — N2581 Secondary hyperparathyroidism of renal origin: Secondary | ICD-10-CM | POA: Diagnosis not present

## 2019-12-18 DIAGNOSIS — D472 Monoclonal gammopathy: Secondary | ICD-10-CM | POA: Diagnosis not present

## 2019-12-18 DIAGNOSIS — Z23 Encounter for immunization: Secondary | ICD-10-CM | POA: Diagnosis not present

## 2019-12-18 DIAGNOSIS — N186 End stage renal disease: Secondary | ICD-10-CM | POA: Diagnosis not present

## 2019-12-18 DIAGNOSIS — E1129 Type 2 diabetes mellitus with other diabetic kidney complication: Secondary | ICD-10-CM | POA: Diagnosis not present

## 2019-12-18 DIAGNOSIS — N2581 Secondary hyperparathyroidism of renal origin: Secondary | ICD-10-CM | POA: Diagnosis not present

## 2019-12-18 DIAGNOSIS — Z992 Dependence on renal dialysis: Secondary | ICD-10-CM | POA: Diagnosis not present

## 2019-12-22 DIAGNOSIS — Z992 Dependence on renal dialysis: Secondary | ICD-10-CM | POA: Diagnosis not present

## 2019-12-22 DIAGNOSIS — E1129 Type 2 diabetes mellitus with other diabetic kidney complication: Secondary | ICD-10-CM | POA: Diagnosis not present

## 2019-12-22 DIAGNOSIS — D472 Monoclonal gammopathy: Secondary | ICD-10-CM | POA: Diagnosis not present

## 2019-12-22 DIAGNOSIS — Z23 Encounter for immunization: Secondary | ICD-10-CM | POA: Diagnosis not present

## 2019-12-22 DIAGNOSIS — N2581 Secondary hyperparathyroidism of renal origin: Secondary | ICD-10-CM | POA: Diagnosis not present

## 2019-12-22 DIAGNOSIS — N186 End stage renal disease: Secondary | ICD-10-CM | POA: Diagnosis not present

## 2019-12-24 DIAGNOSIS — E1129 Type 2 diabetes mellitus with other diabetic kidney complication: Secondary | ICD-10-CM | POA: Diagnosis not present

## 2019-12-24 DIAGNOSIS — N186 End stage renal disease: Secondary | ICD-10-CM | POA: Diagnosis not present

## 2019-12-24 DIAGNOSIS — N2581 Secondary hyperparathyroidism of renal origin: Secondary | ICD-10-CM | POA: Diagnosis not present

## 2019-12-24 DIAGNOSIS — Z23 Encounter for immunization: Secondary | ICD-10-CM | POA: Diagnosis not present

## 2019-12-24 DIAGNOSIS — D472 Monoclonal gammopathy: Secondary | ICD-10-CM | POA: Diagnosis not present

## 2019-12-24 DIAGNOSIS — Z992 Dependence on renal dialysis: Secondary | ICD-10-CM | POA: Diagnosis not present

## 2019-12-26 DIAGNOSIS — N2581 Secondary hyperparathyroidism of renal origin: Secondary | ICD-10-CM | POA: Diagnosis not present

## 2019-12-26 DIAGNOSIS — D472 Monoclonal gammopathy: Secondary | ICD-10-CM | POA: Diagnosis not present

## 2019-12-26 DIAGNOSIS — Z23 Encounter for immunization: Secondary | ICD-10-CM | POA: Diagnosis not present

## 2019-12-26 DIAGNOSIS — E1129 Type 2 diabetes mellitus with other diabetic kidney complication: Secondary | ICD-10-CM | POA: Diagnosis not present

## 2019-12-26 DIAGNOSIS — Z992 Dependence on renal dialysis: Secondary | ICD-10-CM | POA: Diagnosis not present

## 2019-12-26 DIAGNOSIS — N186 End stage renal disease: Secondary | ICD-10-CM | POA: Diagnosis not present

## 2019-12-29 DIAGNOSIS — E1129 Type 2 diabetes mellitus with other diabetic kidney complication: Secondary | ICD-10-CM | POA: Diagnosis not present

## 2019-12-29 DIAGNOSIS — N2581 Secondary hyperparathyroidism of renal origin: Secondary | ICD-10-CM | POA: Diagnosis not present

## 2019-12-29 DIAGNOSIS — N186 End stage renal disease: Secondary | ICD-10-CM | POA: Diagnosis not present

## 2019-12-29 DIAGNOSIS — Z992 Dependence on renal dialysis: Secondary | ICD-10-CM | POA: Diagnosis not present

## 2019-12-29 DIAGNOSIS — D472 Monoclonal gammopathy: Secondary | ICD-10-CM | POA: Diagnosis not present

## 2019-12-29 DIAGNOSIS — Z23 Encounter for immunization: Secondary | ICD-10-CM | POA: Diagnosis not present

## 2019-12-30 ENCOUNTER — Ambulatory Visit: Payer: Medicare Other | Admitting: Cardiology

## 2019-12-30 DIAGNOSIS — Z992 Dependence on renal dialysis: Secondary | ICD-10-CM | POA: Diagnosis not present

## 2019-12-30 DIAGNOSIS — I871 Compression of vein: Secondary | ICD-10-CM | POA: Diagnosis not present

## 2019-12-30 DIAGNOSIS — N186 End stage renal disease: Secondary | ICD-10-CM | POA: Diagnosis not present

## 2019-12-30 DIAGNOSIS — T82858A Stenosis of vascular prosthetic devices, implants and grafts, initial encounter: Secondary | ICD-10-CM | POA: Diagnosis not present

## 2019-12-31 DIAGNOSIS — Z23 Encounter for immunization: Secondary | ICD-10-CM | POA: Diagnosis not present

## 2019-12-31 DIAGNOSIS — E1129 Type 2 diabetes mellitus with other diabetic kidney complication: Secondary | ICD-10-CM | POA: Diagnosis not present

## 2019-12-31 DIAGNOSIS — N2581 Secondary hyperparathyroidism of renal origin: Secondary | ICD-10-CM | POA: Diagnosis not present

## 2019-12-31 DIAGNOSIS — Z992 Dependence on renal dialysis: Secondary | ICD-10-CM | POA: Diagnosis not present

## 2019-12-31 DIAGNOSIS — D472 Monoclonal gammopathy: Secondary | ICD-10-CM | POA: Diagnosis not present

## 2019-12-31 DIAGNOSIS — N186 End stage renal disease: Secondary | ICD-10-CM | POA: Diagnosis not present

## 2020-01-02 DIAGNOSIS — D472 Monoclonal gammopathy: Secondary | ICD-10-CM | POA: Diagnosis not present

## 2020-01-02 DIAGNOSIS — Z992 Dependence on renal dialysis: Secondary | ICD-10-CM | POA: Diagnosis not present

## 2020-01-02 DIAGNOSIS — N2581 Secondary hyperparathyroidism of renal origin: Secondary | ICD-10-CM | POA: Diagnosis not present

## 2020-01-02 DIAGNOSIS — Z23 Encounter for immunization: Secondary | ICD-10-CM | POA: Diagnosis not present

## 2020-01-02 DIAGNOSIS — E1129 Type 2 diabetes mellitus with other diabetic kidney complication: Secondary | ICD-10-CM | POA: Diagnosis not present

## 2020-01-02 DIAGNOSIS — N186 End stage renal disease: Secondary | ICD-10-CM | POA: Diagnosis not present

## 2020-01-04 DIAGNOSIS — N186 End stage renal disease: Secondary | ICD-10-CM | POA: Diagnosis not present

## 2020-01-04 DIAGNOSIS — N2581 Secondary hyperparathyroidism of renal origin: Secondary | ICD-10-CM | POA: Diagnosis not present

## 2020-01-04 DIAGNOSIS — Z23 Encounter for immunization: Secondary | ICD-10-CM | POA: Diagnosis not present

## 2020-01-04 DIAGNOSIS — D472 Monoclonal gammopathy: Secondary | ICD-10-CM | POA: Diagnosis not present

## 2020-01-04 DIAGNOSIS — E1129 Type 2 diabetes mellitus with other diabetic kidney complication: Secondary | ICD-10-CM | POA: Diagnosis not present

## 2020-01-04 DIAGNOSIS — Z992 Dependence on renal dialysis: Secondary | ICD-10-CM | POA: Diagnosis not present

## 2020-01-05 ENCOUNTER — Ambulatory Visit: Payer: Medicare Other | Admitting: Cardiology

## 2020-01-06 DIAGNOSIS — E1129 Type 2 diabetes mellitus with other diabetic kidney complication: Secondary | ICD-10-CM | POA: Diagnosis not present

## 2020-01-06 DIAGNOSIS — Z23 Encounter for immunization: Secondary | ICD-10-CM | POA: Diagnosis not present

## 2020-01-06 DIAGNOSIS — D472 Monoclonal gammopathy: Secondary | ICD-10-CM | POA: Diagnosis not present

## 2020-01-06 DIAGNOSIS — Z992 Dependence on renal dialysis: Secondary | ICD-10-CM | POA: Diagnosis not present

## 2020-01-06 DIAGNOSIS — N186 End stage renal disease: Secondary | ICD-10-CM | POA: Diagnosis not present

## 2020-01-06 DIAGNOSIS — N2581 Secondary hyperparathyroidism of renal origin: Secondary | ICD-10-CM | POA: Diagnosis not present

## 2020-01-09 DIAGNOSIS — D472 Monoclonal gammopathy: Secondary | ICD-10-CM | POA: Diagnosis not present

## 2020-01-09 DIAGNOSIS — N2581 Secondary hyperparathyroidism of renal origin: Secondary | ICD-10-CM | POA: Diagnosis not present

## 2020-01-09 DIAGNOSIS — N186 End stage renal disease: Secondary | ICD-10-CM | POA: Diagnosis not present

## 2020-01-09 DIAGNOSIS — Z23 Encounter for immunization: Secondary | ICD-10-CM | POA: Diagnosis not present

## 2020-01-09 DIAGNOSIS — Z992 Dependence on renal dialysis: Secondary | ICD-10-CM | POA: Diagnosis not present

## 2020-01-09 DIAGNOSIS — E1129 Type 2 diabetes mellitus with other diabetic kidney complication: Secondary | ICD-10-CM | POA: Diagnosis not present

## 2020-01-11 ENCOUNTER — Other Ambulatory Visit: Payer: Self-pay | Admitting: Nurse Practitioner

## 2020-01-11 DIAGNOSIS — IMO0002 Reserved for concepts with insufficient information to code with codable children: Secondary | ICD-10-CM

## 2020-01-11 DIAGNOSIS — E1129 Type 2 diabetes mellitus with other diabetic kidney complication: Secondary | ICD-10-CM

## 2020-01-12 DIAGNOSIS — Z992 Dependence on renal dialysis: Secondary | ICD-10-CM | POA: Diagnosis not present

## 2020-01-12 DIAGNOSIS — N2581 Secondary hyperparathyroidism of renal origin: Secondary | ICD-10-CM | POA: Diagnosis not present

## 2020-01-12 DIAGNOSIS — E1129 Type 2 diabetes mellitus with other diabetic kidney complication: Secondary | ICD-10-CM | POA: Diagnosis not present

## 2020-01-12 DIAGNOSIS — D472 Monoclonal gammopathy: Secondary | ICD-10-CM | POA: Diagnosis not present

## 2020-01-12 DIAGNOSIS — N186 End stage renal disease: Secondary | ICD-10-CM | POA: Diagnosis not present

## 2020-01-12 DIAGNOSIS — Z23 Encounter for immunization: Secondary | ICD-10-CM | POA: Diagnosis not present

## 2020-01-12 MED ORDER — INSULIN DETEMIR 100 UNIT/ML ~~LOC~~ SOLN: 18.0000 [IU] | Freq: Every day | SUBCUTANEOUS | 3 refills | Status: DC

## 2020-01-12 NOTE — Telephone Encounter (Signed)
Pharmacist returned call stating they never received rx in October with updated instructions.  I reviewed RX and it appears it was set to no print and did not make it to the pharmacy, I apologized for this over look and submitted rx, I  assured that class was set to normal which means electronically sent.

## 2020-01-12 NOTE — Telephone Encounter (Signed)
Called patient to clarify how many units of Levemir he is injection daily for the requested units from the pharmacy differ from what we have on current medication list. Patient confirmed that what we have on the medication list is correct.   RX from pharmacy denied  I called [facility] and left a message informing them of the encounter above. I also requested that they prepare rx for levemir that was approved on 12/03/2019 with additional refills for pick-up and to call if they have any questions.

## 2020-01-12 NOTE — Addendum Note (Signed)
Addended by: Logan Bores on: 01/12/2020 10:46 AM   Modules accepted: Orders

## 2020-01-13 DIAGNOSIS — E1122 Type 2 diabetes mellitus with diabetic chronic kidney disease: Secondary | ICD-10-CM | POA: Diagnosis not present

## 2020-01-13 DIAGNOSIS — Z992 Dependence on renal dialysis: Secondary | ICD-10-CM | POA: Diagnosis not present

## 2020-01-13 DIAGNOSIS — N186 End stage renal disease: Secondary | ICD-10-CM | POA: Diagnosis not present

## 2020-01-14 DIAGNOSIS — D472 Monoclonal gammopathy: Secondary | ICD-10-CM | POA: Diagnosis not present

## 2020-01-14 DIAGNOSIS — N186 End stage renal disease: Secondary | ICD-10-CM | POA: Diagnosis not present

## 2020-01-14 DIAGNOSIS — E1129 Type 2 diabetes mellitus with other diabetic kidney complication: Secondary | ICD-10-CM | POA: Diagnosis not present

## 2020-01-14 DIAGNOSIS — D631 Anemia in chronic kidney disease: Secondary | ICD-10-CM | POA: Diagnosis not present

## 2020-01-14 DIAGNOSIS — Z992 Dependence on renal dialysis: Secondary | ICD-10-CM | POA: Diagnosis not present

## 2020-01-14 DIAGNOSIS — N2581 Secondary hyperparathyroidism of renal origin: Secondary | ICD-10-CM | POA: Diagnosis not present

## 2020-01-15 ENCOUNTER — Ambulatory Visit (INDEPENDENT_AMBULATORY_CARE_PROVIDER_SITE_OTHER): Payer: Medicare Other | Admitting: Cardiology

## 2020-01-15 ENCOUNTER — Encounter: Payer: Self-pay | Admitting: Cardiology

## 2020-01-15 VITALS — BP 120/60 | HR 88 | Ht 74.0 in | Wt 204.0 lb

## 2020-01-15 DIAGNOSIS — I255 Ischemic cardiomyopathy: Secondary | ICD-10-CM

## 2020-01-15 DIAGNOSIS — I5042 Chronic combined systolic (congestive) and diastolic (congestive) heart failure: Secondary | ICD-10-CM

## 2020-01-15 DIAGNOSIS — N185 Chronic kidney disease, stage 5: Secondary | ICD-10-CM

## 2020-01-15 DIAGNOSIS — Z951 Presence of aortocoronary bypass graft: Secondary | ICD-10-CM | POA: Diagnosis not present

## 2020-01-15 NOTE — Patient Instructions (Signed)

## 2020-01-15 NOTE — Progress Notes (Signed)
Cardiology Office Note:    Date:  01/15/2020   ID:  Henry Carroll, DOB [birth year], MRN [id number]  PCP:  [name], NP  [facility] Cardiologist:  Candee Furbish, MD  [facility] Electrophysiologist:  None   Referring MD: [name], NP    History of Present Illness:    Henry Carroll is a 69 y.o. male here for follow-up of coronary artery disease.  CABG in 2017 [place] to LAD, free right internal mammary graft to ramus, SVG to OM, SVG to PDA. Other issues includediabetes, hypertension, hyperlipidemia, stage IV-V CKD (Dr. 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   He started dialysis in July 2021.  Dr. Posey Pronto.  He states that this has given him his life back.  He is glad he is doing this.  In fact, there talk to him about potential transplant again in [place].  He denies any chest pain no fevers chills nausea vomiting shortness of breath.  They have pulled back on his carvedilol slightly.  If blood pressure still becomes an issue, can always pull back on the amlodipine.  Past Medical History:  Diagnosis Date  . Abnormal stress test 02/10/2015  . Acute on chronic systolic and diastolic heart failure, NYHA class 1 ([place]) 12/31/2014  . Anemia   . Anemia in chronic kidney disease 09/03/2012  . 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 Asthma   . Asthma, chronic 06/04/2012  . Asthma, chronic, mild intermittent, with acute exacerbation 12/22/2015  . Bilateral lower extremity edema 12/22/2015  . CAD (coronary artery disease) 02/18/2015  . Chronic combined systolic (congestive) and diastolic (congestive) heart failure ([place])    a. 12/31/14: 2D ECHO: EF 40-45%, HK of inf myocardium, G1DD, mod MR  . Chronic combined systolic and diastolic CHF (congestive heart failure) ([place]) 02/10/2015  . Chronic systolic heart failure ([place]) 02/25/2015  . CKD (chronic kidney disease), stage III (HCC)     stage 3 kidney disease  . CKD (chronic kidney disease), stage V ([place]) 08/30/2018  . COLONIC POLYPS, HX OF 04/13/2009   Qualifier: Diagnosis of  By: [place].   . Coronary artery disease    a. LHC 01/2015 - triple vessel CAD (mod oLM, mLAD, severe mRCA, intermediate branch stenosis, CTO of mCx). Plan CABG 02/2015.  Marland Kitchen Demand ischemia ([place]) 12/31/2014  . DM type 2, uncontrolled, with renal complications ([place]) 7/82/9562  . Dyspnea     due to fluid  . Elevated troponin   . Essential hypertension 10/09/2013  . Folliculitis of perineum 10/01/2012  . Gastric erosion   . GERD (gastroesophageal reflux disease)   . History of blood transfusion   . Hyperlipidemia   . Hyperlipidemia with target LDL less than 100 12/31/2014  . Hypertension   . Hypertensive heart disease with heart failure ([place]) 02/25/2015  . IDA (iron deficiency anemia) 01/23/2016  . Melena 01/23/2016  . 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 Mitral regurgitation    a. Mild-mod by echo 12/2014.  . Morbid obesity ([place]) 12/29/2014  . Myocardial infarction [facility])    pt. states per [name] he has in the past  . NSVT (nonsustained ventricular tachycardia) ([place])    a. 9 beats during 01/2015 adm. BB titrated.  . Obesity    a. BMI 33  . Obesity (BMI 30-39.9) 05/05/2013  . OSA (obstructive sleep apnea) 01/16/2018   Mild obstructive  sleep apnea overall with an AHI of 7.3/h and no significant central sleep apnea. Severe obstructive sleep apnea during REM sleep with an AHI of 34.3/h.  Now on CPAP at 6 cm H2O.   . Other malaise and fatigue 09/03/2012  . 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 PAD (peripheral artery disease) ([place]) 03/02/2015  . Pneumonia   . Sleep apnea    2019, Dr.Turner diagnoised   . Small bowel lesion   . Symptomatic anemia 08/29/2018  . 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 Type II diabetes mellitus ([place])   . Wears dentures     Past Surgical History:  Procedure  Laterality Date  . BACK SURGERY    . BASCILIC VEIN TRANSPOSITION Left 11/14/2018   Procedure: FIRST STAGE BASILIC VEIN TRANSPOSITION LEFT ARM;  Surgeon: Serafina Mitchell, MD;  Location: [facility];  Service: Vascular;  Laterality: Left;  . BASCILIC VEIN TRANSPOSITION Left 01/28/2019   Procedure: BASILIC VEIN TRANSPOSITION SECOND STAGE;  Surgeon: Serafina Mitchell, MD;  Location: [facility];  Service: Vascular;  Laterality: Left;  . BONE MARROW BIOPSY    . CARDIAC CATHETERIZATION N/A 02/09/2015   Procedure: Left Heart Cath and Coronary Angiography;  Surgeon: Burnell Blanks, MD;  Location: [facility] CV [facility];  Service: Cardiovascular;  Laterality: N/A;  . COLONOSCOPY  2011   Dr. [name]: Ileocecal valve appeared normal, scattered pancolonic diverticulosis, difficult bowel prep making smaller lesions potentially missed. Recommended three-year follow-up colonoscopy.  . COLONOSCOPY  2003   Dr. [name]: Suspicious lesion at the ileocecal valve, multiple biopsies benign, pancolonic diverticulosis.  . COLONOSCOPY N/A 02/15/2016   diverticulosis in sigmoid and descending colon, single 5 mm polyp at splenic flexure (Tubular adenoma)  . CORONARY ARTERY BYPASS GRAFT N/A 02/18/2015   Procedure: CORONARY ARTERY BYPASS GRAFTING (CABG) x  four, using bilateral internal mammary arteries and right leg greater saphenous vein harvested endoscopically;  Surgeon: [name], MD;  Location: [facility];  Service: Open Heart Surgery;  Laterality: N/A;  . ESOPHAGOGASTRODUODENOSCOPY N/A 02/15/2016   normal  . EYE SURGERY Right    July 2019   . GIVENS CAPSULE STUDY N/A 01/08/2017   couple of gastric and small bowel eroions in setting of aspirin 81 mg daily but nothing concerning, continue Hematology follow-up  . HERNIA REPAIR  [id number]   Umbilical  . LUMBAR [place] SURGERY  2006   L4 & L5  . REFRACTIVE SURGERY Left    2019   . REFRACTIVE SURGERY Right    2019  . TEE WITHOUT CARDIOVERSION N/A 02/18/2015   Procedure: TRANSESOPHAGEAL  ECHOCARDIOGRAM (TEE);  Surgeon: [name], MD;  Location: [facility];  Service: Open Heart Surgery;  Laterality: N/A;  . TONSILLECTOMY  1962    Current Medications: Current Meds  Medication Sig  . acetaminophen (TYLENOL) 500 MG tablet Take 1,000 mg by mouth 2 (two) times daily as needed for moderate pain.  Marland Kitchen ADVAIR DISKUS 100-50 MCG/DOSE AEPB SMARTSIG:1 Puff(s) Via Inhaler Twice Daily PRN  . amLODipine (NORVASC) 10 MG tablet Take 1 tablet by mouth once daily  . aspirin EC 81 MG tablet Take 1 tablet (81 mg total) by mouth daily.  Marland Kitchen atorvastatin (LIPITOR) 40 MG tablet Take 1 tablet (40 mg total) by mouth every evening.  . carvedilol (COREG) 12.5 MG tablet Take 12.5 mg by mouth daily.   . Cholecalciferol (VITAMIN D3) 25 MCG (1000 UT) CAPS Take 2 capsules by mouth daily.  . Cinnamon 500 MG capsule Take 1,000 mg by mouth daily.   Marland Kitchen  Coenzyme Q10 10 MG capsule Take 10 mg by mouth daily.  Marland Kitchen epoetin alfa-epbx (RETACRIT) 67341 UNIT/ML injection 20,000 Units every 14 (fourteen) days.   Marland Kitchen ezetimibe (ZETIA) 10 MG tablet Take 1 tablet (10 mg total) by mouth daily.  . fluticasone (FLONASE) 50 MCG/ACT nasal spray Place 2 sprays into both nostrils daily as needed for allergies or rhinitis (SEASONAL ALLERGIES).  . furosemide (LASIX) 40 MG tablet Take 40 mg by mouth. On S-M-W-F Takes  1 tablet by mouth once d  . glucose blood (ONE TOUCH ULTRA TEST) test strip Use to test blood sugar three times daily. Dx: E11.21  . hydrALAZINE (APRESOLINE) 50 MG tablet Take 25 mg by mouth daily. Taking just one tablet a day - 25 mg  . insulin detemir (LEVEMIR) 100 UNIT/ML injection Inject 0.18 mLs (18 Units total) into the skin daily. In the morning  . isosorbide mononitrate (IMDUR) 30 MG 24 hr tablet TAKE 1 TABLET BY MOUTH ONCE DAILY -- PATIENT NEEDS APPOINTMENT FOR FUTURE REFILLS  . LAMISIL AT 1 % cream SMARTSIG:Sparingly Topical PRN  . montelukast (SINGULAIR) 10 MG tablet TAKE 1 TABLET BY MOUTH AT BEDTIME  . Multiple  Vitamin (MULTIVITAMIN WITH MINERALS) TABS tablet Take 1 tablet by mouth daily.  . nitroGLYCERIN (NITROSTAT) 0.4 MG SL tablet Place 1 tablet (0.4 mg total) under the tongue every 5 (five) minutes as needed for chest pain.  Marland Kitchen PROAIR HFA 108 (90 Base) MCG/ACT inhaler INHALE 2 PUFFS BY MOUTH EVERY 6 HOURS AS NEEDED FOR SHORTNESS OF BREATH  . sevelamer carbonate (RENVELA) 800 MG tablet Take 800 mg by mouth 3 (three) times daily with meals.     Allergies:   Patient has no known allergies.   Social History   Socioeconomic History  . Marital status: Married    Spouse name: Not on file  . Number of children: Not on file  . Years of education: Not on file  . Highest education level: Not on file  Occupational History  . Not on file  Tobacco Use  . Smoking status: Former Smoker    Years: 17.00    Types: Cigars    Quit date: 12/31/2014    Years since quitting: 5.0  . Smokeless tobacco: Never Used  Vaping Use  . Vaping Use: Never used  Substance and Sexual Activity  . Alcohol use: Yes    Alcohol/week: 3.0 standard drinks    Types: 3 Glasses of wine per week    Comment: occasional glass of wine.  . Drug use: No  . Sexual activity: Yes    Birth control/protection: None  Other Topics Concern  . Not on file  Social History Narrative  . Not on file   Social Determinants of Health   Financial Resource Strain:   . Difficulty of Paying Living Expenses: Not on file  Food Insecurity:   . Worried About Charity fundraiser in the Last Year: Not on file  . Ran Out of Food in the Last Year: Not on file  Transportation Needs:   . Lack of Transportation (Medical): Not on file  . Lack of Transportation (Non-Medical): Not on file  Physical Activity:   . Days of Exercise per Week: Not on file  . Minutes of Exercise per Session: Not on file  Stress:   . Feeling of Stress : Not on file  Social Connections:   . Frequency of Communication with Friends and Family: Not on file  . Frequency of  Social Gatherings with Friends  and Family: Not on file  . Attends Religious Services: Not on file  . Active Member of Clubs or Organizations: Not on file  . Attends Archivist Meetings: Not on file  . Marital Status: Not on file     Family History: The patient's family history includes Colon cancer in his paternal uncle; Diabetes in his mother; Heart disease in his father and mother; Hypertension in his father; Kidney disease in his daughter; Stroke in his sister; Sudden death in his daughter. There is no history of Heart attack.  ROS:   Please see the history of present illness.     All other systems reviewed and are negative.  EKGs/Labs/Other Studies Reviewed:    The following studies were reviewed today:  ECHO IMPRESSIONS 10/2018  1. Left ventricular ejection fraction, by visual estimation, is 40 to  45%. The left ventricle has mild to moderately decreased function. Normal  left ventricular size. There is mildly increased left ventricular  hypertrophy. Severe hypokinesis of the  basal to mid inferior and inferolateral walls.  2. The mitral valve is normal in structure. Trace mitral valve  regurgitation. No evidence of mitral stenosis.  3. The tricuspid valve is normal in structure. Tricuspid valve  regurgitation is trivial.  4. The aortic valve is tricuspid Aortic valve regurgitation was not  visualized by color flow Doppler. Mild aortic valve sclerosis without  stenosis.  5. 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  6. Left atrial size was mildly dilated.  7. Left ventricular diastolic Doppler parameters are consistent with  pseudonormalization pattern of LV diastolic filling.  8. Right atrial size was mildly dilated.  9. Global right ventricle has mildly reduced systolic function.The right  ventricular size is mildly enlarged. No increase in right ventricular wall  thickness.  10. The inferior vena cava is normal in size with  greater than 50%  respiratory variability, suggesting right atrial pressure of 3 mmHg.  11. Trivial pericardial effusion is present.  12. The tricuspid regurgitant velocity is 2.90 m/s, and with an assumed  right atrial pressure of 3 mmHg, the estimated right ventricular systolic  pressure is mildly elevated at 36.6 mmHg.   MyoviewStudy Highlights9/2018    The left ventricular ejection fraction is moderately decreased (30-44%).  Nuclear stress EF: 40%.  There was no ST segment deviation noted during stress.  Findings consistent with prior myocardial infarction with peri-infarct ischemia.  This is an intermediate risk study.  1. EF 40% with inferolateral hypokinesis.  2. Primarily fixed medium-sized, moderate intensity basal to mid inferolateral and basal inferior perfusion defect. This is suggestive of prior infarction with mild peri-infarct ischemia.   Intermediate risk study primarily due to low EF.        EKG: Prior EKG on 10/05/2019 shows nonspecific T wave changes.  No change from prior.  Recent Labs: 08/12/2019: Magnesium 2.0 10/05/2019: BUN 83; Creatinine, Ser 7.00; Hemoglobin 11.0; Platelets 218; Potassium 3.9; Sodium 133 11/16/2019: ALT 15  Recent Lipid Panel    Component Value Date/Time   CHOL 127 11/16/2019 0930   TRIG 74 11/16/2019 0930   HDL 60 11/16/2019 0930   CHOLHDL 2.1 11/16/2019 0930   CHOLHDL 3.8 01/07/2019 1108   VLDL 33 (H) 09/26/2016 0903   LDLCALC 52 11/16/2019 0930   LDLCALC 62 01/07/2019 1108     Risk Assessment/Calculations:      Physical Exam:    VS:  BP 120/60 (BP Location: Right Arm, Patient Position: Sitting, Cuff Size:  Normal)   Pulse 88   Ht _0  (1.88 m)   Wt 204 lb (92.5 kg)   SpO2 96%   BMI 26.19 kg/m     Wt Readings from Last 3 Encounters:  01/15/20 204 lb (92.5 kg)  11/23/19 201 lb 12.8 oz (91.5 kg)  10/12/19 196 lb (88.9 kg)     GEN:  Well nourished, well developed in no acute distress HEENT:  Normal NECK: No JVD; No carotid bruits LYMPHATICS: No lymphadenopathy CARDIAC: RRR, no murmurs, rubs, gallops RESPIRATORY:  Clear to auscultation without rales, wheezing or rhonchi  ABDOMEN: Soft, non-tender, non-distended MUSCULOSKELETAL:  No edema; No deformity  SKIN: Warm and dry NEUROLOGIC:  Alert and oriented x 3 PSYCHIATRIC:  Normal affect   ASSESSMENT:    1. Chronic combined systolic and diastolic CHF (congestive heart failure) ([place])   2. S/P CABG x 4   3. CKD (chronic kidney disease) stage 5, GFR less than 15 ml/min (HCC)    PLAN:    In order of problems listed above:  Coronary disease prior CABG -Doing well on long-acting nitrate.  Continue with antianginal.  No anginal symptoms currently. -Nuclear stress test 2018 stable.  Chronic systolic and diastolic heart failure -EF 40 to 45%. -Volume status through dialysis.  CKD stage V on hemodialysis since July 2021.  He states that dialysis is given his life back.  He feels much better.  Able to play golf.  Essential hypertension -Nephrology will be handling.  Good blood pressure.  Hyperlipidemia -Continuing with statin atorvastatin.  Zetia.  OSA -[name].  Diabetes -Management per primary team.  He was hoping that as he lost his weight that he would be able to come off of his insulin.  He is still decreased his insulin quite a bit but he is still on.    Shared Decision Making/Informed Consent        Medication Adjustments/Labs and Tests Ordered: Current medicines are reviewed at length with the patient today.  Concerns regarding medicines are outlined above.  No orders of the defined types were placed in this encounter.  No orders of the defined types were placed in this encounter.   Patient Instructions  Medication Instructions:  The current medical regimen is effective;  continue present plan and medications.  *If you need a refill on your cardiac medications before your next appointment, please  call your pharmacy*  Follow-Up: At [facility], you and your health needs are our priority.  As part of our continuing mission to provide you with exceptional heart care, we have created designated Provider Care Teams.  These Care Teams include your primary Cardiologist (physician) and Advanced Practice Providers (APPs -  Physician Assistants and Nurse Practitioners) who all work together to provide you with the care you need, when you need it.  We recommend signing up for the patient portal called "MyChart".  Sign up information is provided on this After Visit Summary.  MyChart is used to connect with patients for Virtual Visits (Telemedicine).  Patients are able to view lab/test results, encounter notes, upcoming appointments, etc.  Non-urgent messages can be sent to your provider as well.   To learn more about what you can do with MyChart, go to [email or web address].    Your next appointment:   6 month(s)  The format for your next appointment:   In Person  Provider:   Candee Furbish, MD  Thank you for choosing [facility]!!         Signed,  Candee Furbish, MD  01/15/2020 5:05 PM    [place]

## 2020-01-16 DIAGNOSIS — D472 Monoclonal gammopathy: Secondary | ICD-10-CM | POA: Diagnosis not present

## 2020-01-16 DIAGNOSIS — N2581 Secondary hyperparathyroidism of renal origin: Secondary | ICD-10-CM | POA: Diagnosis not present

## 2020-01-16 DIAGNOSIS — E1129 Type 2 diabetes mellitus with other diabetic kidney complication: Secondary | ICD-10-CM | POA: Diagnosis not present

## 2020-01-16 DIAGNOSIS — Z992 Dependence on renal dialysis: Secondary | ICD-10-CM | POA: Diagnosis not present

## 2020-01-16 DIAGNOSIS — N186 End stage renal disease: Secondary | ICD-10-CM | POA: Diagnosis not present

## 2020-01-16 DIAGNOSIS — D631 Anemia in chronic kidney disease: Secondary | ICD-10-CM | POA: Diagnosis not present

## 2020-01-19 DIAGNOSIS — E1129 Type 2 diabetes mellitus with other diabetic kidney complication: Secondary | ICD-10-CM | POA: Diagnosis not present

## 2020-01-19 DIAGNOSIS — Z992 Dependence on renal dialysis: Secondary | ICD-10-CM | POA: Diagnosis not present

## 2020-01-19 DIAGNOSIS — N2581 Secondary hyperparathyroidism of renal origin: Secondary | ICD-10-CM | POA: Diagnosis not present

## 2020-01-19 DIAGNOSIS — D472 Monoclonal gammopathy: Secondary | ICD-10-CM | POA: Diagnosis not present

## 2020-01-19 DIAGNOSIS — D631 Anemia in chronic kidney disease: Secondary | ICD-10-CM | POA: Diagnosis not present

## 2020-01-19 DIAGNOSIS — N186 End stage renal disease: Secondary | ICD-10-CM | POA: Diagnosis not present

## 2020-01-21 DIAGNOSIS — D472 Monoclonal gammopathy: Secondary | ICD-10-CM | POA: Diagnosis not present

## 2020-01-21 DIAGNOSIS — E1129 Type 2 diabetes mellitus with other diabetic kidney complication: Secondary | ICD-10-CM | POA: Diagnosis not present

## 2020-01-21 DIAGNOSIS — D631 Anemia in chronic kidney disease: Secondary | ICD-10-CM | POA: Diagnosis not present

## 2020-01-21 DIAGNOSIS — N186 End stage renal disease: Secondary | ICD-10-CM | POA: Diagnosis not present

## 2020-01-21 DIAGNOSIS — Z992 Dependence on renal dialysis: Secondary | ICD-10-CM | POA: Diagnosis not present

## 2020-01-21 DIAGNOSIS — N2581 Secondary hyperparathyroidism of renal origin: Secondary | ICD-10-CM | POA: Diagnosis not present

## 2020-01-22 ENCOUNTER — Other Ambulatory Visit: Payer: Self-pay | Admitting: Nurse Practitioner

## 2020-01-23 DIAGNOSIS — E1129 Type 2 diabetes mellitus with other diabetic kidney complication: Secondary | ICD-10-CM | POA: Diagnosis not present

## 2020-01-23 DIAGNOSIS — N186 End stage renal disease: Secondary | ICD-10-CM | POA: Diagnosis not present

## 2020-01-23 DIAGNOSIS — D631 Anemia in chronic kidney disease: Secondary | ICD-10-CM | POA: Diagnosis not present

## 2020-01-23 DIAGNOSIS — D472 Monoclonal gammopathy: Secondary | ICD-10-CM | POA: Diagnosis not present

## 2020-01-23 DIAGNOSIS — Z992 Dependence on renal dialysis: Secondary | ICD-10-CM | POA: Diagnosis not present

## 2020-01-23 DIAGNOSIS — N2581 Secondary hyperparathyroidism of renal origin: Secondary | ICD-10-CM | POA: Diagnosis not present

## 2020-01-26 DIAGNOSIS — E1129 Type 2 diabetes mellitus with other diabetic kidney complication: Secondary | ICD-10-CM | POA: Diagnosis not present

## 2020-01-26 DIAGNOSIS — N186 End stage renal disease: Secondary | ICD-10-CM | POA: Diagnosis not present

## 2020-01-26 DIAGNOSIS — D631 Anemia in chronic kidney disease: Secondary | ICD-10-CM | POA: Diagnosis not present

## 2020-01-26 DIAGNOSIS — Z992 Dependence on renal dialysis: Secondary | ICD-10-CM | POA: Diagnosis not present

## 2020-01-26 DIAGNOSIS — N2581 Secondary hyperparathyroidism of renal origin: Secondary | ICD-10-CM | POA: Diagnosis not present

## 2020-01-26 DIAGNOSIS — D472 Monoclonal gammopathy: Secondary | ICD-10-CM | POA: Diagnosis not present

## 2020-01-28 DIAGNOSIS — N186 End stage renal disease: Secondary | ICD-10-CM | POA: Diagnosis not present

## 2020-01-28 DIAGNOSIS — E1129 Type 2 diabetes mellitus with other diabetic kidney complication: Secondary | ICD-10-CM | POA: Diagnosis not present

## 2020-01-28 DIAGNOSIS — Z992 Dependence on renal dialysis: Secondary | ICD-10-CM | POA: Diagnosis not present

## 2020-01-28 DIAGNOSIS — D472 Monoclonal gammopathy: Secondary | ICD-10-CM | POA: Diagnosis not present

## 2020-01-28 DIAGNOSIS — N2581 Secondary hyperparathyroidism of renal origin: Secondary | ICD-10-CM | POA: Diagnosis not present

## 2020-01-28 DIAGNOSIS — D631 Anemia in chronic kidney disease: Secondary | ICD-10-CM | POA: Diagnosis not present

## 2020-01-30 DIAGNOSIS — N2581 Secondary hyperparathyroidism of renal origin: Secondary | ICD-10-CM | POA: Diagnosis not present

## 2020-01-30 DIAGNOSIS — Z992 Dependence on renal dialysis: Secondary | ICD-10-CM | POA: Diagnosis not present

## 2020-01-30 DIAGNOSIS — D631 Anemia in chronic kidney disease: Secondary | ICD-10-CM | POA: Diagnosis not present

## 2020-01-30 DIAGNOSIS — D472 Monoclonal gammopathy: Secondary | ICD-10-CM | POA: Diagnosis not present

## 2020-01-30 DIAGNOSIS — E1129 Type 2 diabetes mellitus with other diabetic kidney complication: Secondary | ICD-10-CM | POA: Diagnosis not present

## 2020-01-30 DIAGNOSIS — N186 End stage renal disease: Secondary | ICD-10-CM | POA: Diagnosis not present

## 2020-02-02 DIAGNOSIS — D631 Anemia in chronic kidney disease: Secondary | ICD-10-CM | POA: Diagnosis not present

## 2020-02-02 DIAGNOSIS — Z992 Dependence on renal dialysis: Secondary | ICD-10-CM | POA: Diagnosis not present

## 2020-02-02 DIAGNOSIS — D472 Monoclonal gammopathy: Secondary | ICD-10-CM | POA: Diagnosis not present

## 2020-02-02 DIAGNOSIS — N2581 Secondary hyperparathyroidism of renal origin: Secondary | ICD-10-CM | POA: Diagnosis not present

## 2020-02-02 DIAGNOSIS — N186 End stage renal disease: Secondary | ICD-10-CM | POA: Diagnosis not present

## 2020-02-02 DIAGNOSIS — E1129 Type 2 diabetes mellitus with other diabetic kidney complication: Secondary | ICD-10-CM | POA: Diagnosis not present

## 2020-02-03 ENCOUNTER — Other Ambulatory Visit: Payer: Self-pay | Admitting: Nurse Practitioner

## 2020-02-04 DIAGNOSIS — E1129 Type 2 diabetes mellitus with other diabetic kidney complication: Secondary | ICD-10-CM | POA: Diagnosis not present

## 2020-02-04 DIAGNOSIS — N2581 Secondary hyperparathyroidism of renal origin: Secondary | ICD-10-CM | POA: Diagnosis not present

## 2020-02-04 DIAGNOSIS — D472 Monoclonal gammopathy: Secondary | ICD-10-CM | POA: Diagnosis not present

## 2020-02-04 DIAGNOSIS — D631 Anemia in chronic kidney disease: Secondary | ICD-10-CM | POA: Diagnosis not present

## 2020-02-04 DIAGNOSIS — N186 End stage renal disease: Secondary | ICD-10-CM | POA: Diagnosis not present

## 2020-02-04 DIAGNOSIS — Z992 Dependence on renal dialysis: Secondary | ICD-10-CM | POA: Diagnosis not present

## 2020-02-07 DIAGNOSIS — E1129 Type 2 diabetes mellitus with other diabetic kidney complication: Secondary | ICD-10-CM | POA: Diagnosis not present

## 2020-02-07 DIAGNOSIS — Z992 Dependence on renal dialysis: Secondary | ICD-10-CM | POA: Diagnosis not present

## 2020-02-07 DIAGNOSIS — D631 Anemia in chronic kidney disease: Secondary | ICD-10-CM | POA: Diagnosis not present

## 2020-02-07 DIAGNOSIS — N186 End stage renal disease: Secondary | ICD-10-CM | POA: Diagnosis not present

## 2020-02-07 DIAGNOSIS — D472 Monoclonal gammopathy: Secondary | ICD-10-CM | POA: Diagnosis not present

## 2020-02-07 DIAGNOSIS — N2581 Secondary hyperparathyroidism of renal origin: Secondary | ICD-10-CM | POA: Diagnosis not present

## 2020-02-09 DIAGNOSIS — E1129 Type 2 diabetes mellitus with other diabetic kidney complication: Secondary | ICD-10-CM | POA: Diagnosis not present

## 2020-02-09 DIAGNOSIS — N186 End stage renal disease: Secondary | ICD-10-CM | POA: Diagnosis not present

## 2020-02-09 DIAGNOSIS — D472 Monoclonal gammopathy: Secondary | ICD-10-CM | POA: Diagnosis not present

## 2020-02-09 DIAGNOSIS — N2581 Secondary hyperparathyroidism of renal origin: Secondary | ICD-10-CM | POA: Diagnosis not present

## 2020-02-09 DIAGNOSIS — D631 Anemia in chronic kidney disease: Secondary | ICD-10-CM | POA: Diagnosis not present

## 2020-02-09 DIAGNOSIS — Z992 Dependence on renal dialysis: Secondary | ICD-10-CM | POA: Diagnosis not present

## 2020-02-11 DIAGNOSIS — D472 Monoclonal gammopathy: Secondary | ICD-10-CM | POA: Diagnosis not present

## 2020-02-11 DIAGNOSIS — D631 Anemia in chronic kidney disease: Secondary | ICD-10-CM | POA: Diagnosis not present

## 2020-02-11 DIAGNOSIS — N186 End stage renal disease: Secondary | ICD-10-CM | POA: Diagnosis not present

## 2020-02-11 DIAGNOSIS — N2581 Secondary hyperparathyroidism of renal origin: Secondary | ICD-10-CM | POA: Diagnosis not present

## 2020-02-11 DIAGNOSIS — E1129 Type 2 diabetes mellitus with other diabetic kidney complication: Secondary | ICD-10-CM | POA: Diagnosis not present

## 2020-02-11 DIAGNOSIS — Z992 Dependence on renal dialysis: Secondary | ICD-10-CM | POA: Diagnosis not present

## 2020-02-13 DIAGNOSIS — E1122 Type 2 diabetes mellitus with diabetic chronic kidney disease: Secondary | ICD-10-CM | POA: Diagnosis not present

## 2020-02-13 DIAGNOSIS — Z992 Dependence on renal dialysis: Secondary | ICD-10-CM | POA: Diagnosis not present

## 2020-02-13 DIAGNOSIS — N186 End stage renal disease: Secondary | ICD-10-CM | POA: Diagnosis not present

## 2020-02-14 DIAGNOSIS — E1129 Type 2 diabetes mellitus with other diabetic kidney complication: Secondary | ICD-10-CM | POA: Diagnosis not present

## 2020-02-14 DIAGNOSIS — Z992 Dependence on renal dialysis: Secondary | ICD-10-CM | POA: Diagnosis not present

## 2020-02-14 DIAGNOSIS — D631 Anemia in chronic kidney disease: Secondary | ICD-10-CM | POA: Diagnosis not present

## 2020-02-14 DIAGNOSIS — D472 Monoclonal gammopathy: Secondary | ICD-10-CM | POA: Diagnosis not present

## 2020-02-14 DIAGNOSIS — N186 End stage renal disease: Secondary | ICD-10-CM | POA: Diagnosis not present

## 2020-02-14 DIAGNOSIS — N2581 Secondary hyperparathyroidism of renal origin: Secondary | ICD-10-CM | POA: Diagnosis not present

## 2020-02-16 DIAGNOSIS — Z992 Dependence on renal dialysis: Secondary | ICD-10-CM | POA: Diagnosis not present

## 2020-02-16 DIAGNOSIS — N186 End stage renal disease: Secondary | ICD-10-CM | POA: Diagnosis not present

## 2020-02-16 DIAGNOSIS — D472 Monoclonal gammopathy: Secondary | ICD-10-CM | POA: Diagnosis not present

## 2020-02-16 DIAGNOSIS — D631 Anemia in chronic kidney disease: Secondary | ICD-10-CM | POA: Diagnosis not present

## 2020-02-16 DIAGNOSIS — N2581 Secondary hyperparathyroidism of renal origin: Secondary | ICD-10-CM | POA: Diagnosis not present

## 2020-02-16 DIAGNOSIS — E1129 Type 2 diabetes mellitus with other diabetic kidney complication: Secondary | ICD-10-CM | POA: Diagnosis not present

## 2020-02-18 DIAGNOSIS — D472 Monoclonal gammopathy: Secondary | ICD-10-CM | POA: Diagnosis not present

## 2020-02-18 DIAGNOSIS — N186 End stage renal disease: Secondary | ICD-10-CM | POA: Diagnosis not present

## 2020-02-18 DIAGNOSIS — N2581 Secondary hyperparathyroidism of renal origin: Secondary | ICD-10-CM | POA: Diagnosis not present

## 2020-02-18 DIAGNOSIS — Z992 Dependence on renal dialysis: Secondary | ICD-10-CM | POA: Diagnosis not present

## 2020-02-18 DIAGNOSIS — E1129 Type 2 diabetes mellitus with other diabetic kidney complication: Secondary | ICD-10-CM | POA: Diagnosis not present

## 2020-02-18 DIAGNOSIS — D631 Anemia in chronic kidney disease: Secondary | ICD-10-CM | POA: Diagnosis not present

## 2020-02-18 LAB — BASIC METABOLIC PANEL
BUN: 60 — AB (ref 4–21)
Potassium: 4.9 (ref 3.4–5.3)

## 2020-02-18 LAB — COMPREHENSIVE METABOLIC PANEL: Calcium: 9.3 (ref 8.7–10.7)

## 2020-02-20 DIAGNOSIS — D472 Monoclonal gammopathy: Secondary | ICD-10-CM | POA: Diagnosis not present

## 2020-02-20 DIAGNOSIS — N2581 Secondary hyperparathyroidism of renal origin: Secondary | ICD-10-CM | POA: Diagnosis not present

## 2020-02-20 DIAGNOSIS — N186 End stage renal disease: Secondary | ICD-10-CM | POA: Diagnosis not present

## 2020-02-20 DIAGNOSIS — Z992 Dependence on renal dialysis: Secondary | ICD-10-CM | POA: Diagnosis not present

## 2020-02-20 DIAGNOSIS — D631 Anemia in chronic kidney disease: Secondary | ICD-10-CM | POA: Diagnosis not present

## 2020-02-20 DIAGNOSIS — E1129 Type 2 diabetes mellitus with other diabetic kidney complication: Secondary | ICD-10-CM | POA: Diagnosis not present

## 2020-02-22 DIAGNOSIS — D631 Anemia in chronic kidney disease: Secondary | ICD-10-CM | POA: Diagnosis not present

## 2020-02-22 DIAGNOSIS — N186 End stage renal disease: Secondary | ICD-10-CM | POA: Diagnosis not present

## 2020-02-22 DIAGNOSIS — Z992 Dependence on renal dialysis: Secondary | ICD-10-CM | POA: Diagnosis not present

## 2020-02-22 DIAGNOSIS — N2581 Secondary hyperparathyroidism of renal origin: Secondary | ICD-10-CM | POA: Diagnosis not present

## 2020-02-22 DIAGNOSIS — D472 Monoclonal gammopathy: Secondary | ICD-10-CM | POA: Diagnosis not present

## 2020-02-22 DIAGNOSIS — E1129 Type 2 diabetes mellitus with other diabetic kidney complication: Secondary | ICD-10-CM | POA: Diagnosis not present

## 2020-02-24 DIAGNOSIS — N186 End stage renal disease: Secondary | ICD-10-CM | POA: Diagnosis not present

## 2020-02-24 DIAGNOSIS — N2581 Secondary hyperparathyroidism of renal origin: Secondary | ICD-10-CM | POA: Diagnosis not present

## 2020-02-24 DIAGNOSIS — Z992 Dependence on renal dialysis: Secondary | ICD-10-CM | POA: Diagnosis not present

## 2020-02-24 DIAGNOSIS — D472 Monoclonal gammopathy: Secondary | ICD-10-CM | POA: Diagnosis not present

## 2020-02-24 DIAGNOSIS — E1129 Type 2 diabetes mellitus with other diabetic kidney complication: Secondary | ICD-10-CM | POA: Diagnosis not present

## 2020-02-24 DIAGNOSIS — D631 Anemia in chronic kidney disease: Secondary | ICD-10-CM | POA: Diagnosis not present

## 2020-02-25 DIAGNOSIS — D472 Monoclonal gammopathy: Secondary | ICD-10-CM | POA: Diagnosis not present

## 2020-02-25 DIAGNOSIS — E1129 Type 2 diabetes mellitus with other diabetic kidney complication: Secondary | ICD-10-CM | POA: Diagnosis not present

## 2020-02-25 DIAGNOSIS — D631 Anemia in chronic kidney disease: Secondary | ICD-10-CM | POA: Diagnosis not present

## 2020-02-25 DIAGNOSIS — N2581 Secondary hyperparathyroidism of renal origin: Secondary | ICD-10-CM | POA: Diagnosis not present

## 2020-02-25 DIAGNOSIS — Z992 Dependence on renal dialysis: Secondary | ICD-10-CM | POA: Diagnosis not present

## 2020-02-25 DIAGNOSIS — N186 End stage renal disease: Secondary | ICD-10-CM | POA: Diagnosis not present

## 2020-02-29 DIAGNOSIS — D472 Monoclonal gammopathy: Secondary | ICD-10-CM | POA: Diagnosis not present

## 2020-02-29 DIAGNOSIS — N186 End stage renal disease: Secondary | ICD-10-CM | POA: Diagnosis not present

## 2020-02-29 DIAGNOSIS — N2581 Secondary hyperparathyroidism of renal origin: Secondary | ICD-10-CM | POA: Diagnosis not present

## 2020-02-29 DIAGNOSIS — Z992 Dependence on renal dialysis: Secondary | ICD-10-CM | POA: Diagnosis not present

## 2020-02-29 DIAGNOSIS — E1129 Type 2 diabetes mellitus with other diabetic kidney complication: Secondary | ICD-10-CM | POA: Diagnosis not present

## 2020-02-29 DIAGNOSIS — D631 Anemia in chronic kidney disease: Secondary | ICD-10-CM | POA: Diagnosis not present

## 2020-03-03 DIAGNOSIS — D631 Anemia in chronic kidney disease: Secondary | ICD-10-CM | POA: Diagnosis not present

## 2020-03-03 DIAGNOSIS — D472 Monoclonal gammopathy: Secondary | ICD-10-CM | POA: Diagnosis not present

## 2020-03-03 DIAGNOSIS — Z992 Dependence on renal dialysis: Secondary | ICD-10-CM | POA: Diagnosis not present

## 2020-03-03 DIAGNOSIS — N2581 Secondary hyperparathyroidism of renal origin: Secondary | ICD-10-CM | POA: Diagnosis not present

## 2020-03-03 DIAGNOSIS — N186 End stage renal disease: Secondary | ICD-10-CM | POA: Diagnosis not present

## 2020-03-03 DIAGNOSIS — E1129 Type 2 diabetes mellitus with other diabetic kidney complication: Secondary | ICD-10-CM | POA: Diagnosis not present

## 2020-03-03 DIAGNOSIS — E039 Hypothyroidism, unspecified: Secondary | ICD-10-CM | POA: Diagnosis not present

## 2020-03-03 LAB — TSH: TSH: 0.93 (ref 0.41–5.90)

## 2020-03-03 LAB — VITAMIN D 25 HYDROXY (VIT D DEFICIENCY, FRACTURES): Vit D, 25-Hydroxy: 42

## 2020-03-03 LAB — BASIC METABOLIC PANEL
Chloride: 92 — AB (ref 99–108)
Creatinine: 11.1 — AB (ref 0.6–1.3)
Glucose: 288
Sodium: 136 — AB (ref 137–147)

## 2020-03-03 LAB — COMPREHENSIVE METABOLIC PANEL: Calcium: 9.6 (ref 8.7–10.7)

## 2020-03-03 LAB — HEPATIC FUNCTION PANEL
AST: 14 (ref 14–40)
Alkaline Phosphatase: 56 (ref 25–125)

## 2020-03-03 LAB — IRON,TIBC AND FERRITIN PANEL
TIBC: 5
UIBC: 19831

## 2020-03-03 LAB — CBC AND DIFFERENTIAL
HCT: 34 — AB (ref 41–53)
Hemoglobin: 11.5 — AB (ref 13.5–17.5)
Platelets: 224 (ref 150–399)
WBC: 6.4

## 2020-03-03 LAB — CBC: RBC: 3.2 — AB (ref 3.87–5.11)

## 2020-03-03 LAB — HEMOGLOBIN A1C: Hemoglobin A1C: 7

## 2020-03-05 DIAGNOSIS — Z992 Dependence on renal dialysis: Secondary | ICD-10-CM | POA: Diagnosis not present

## 2020-03-05 DIAGNOSIS — D631 Anemia in chronic kidney disease: Secondary | ICD-10-CM | POA: Diagnosis not present

## 2020-03-05 DIAGNOSIS — N2581 Secondary hyperparathyroidism of renal origin: Secondary | ICD-10-CM | POA: Diagnosis not present

## 2020-03-05 DIAGNOSIS — D472 Monoclonal gammopathy: Secondary | ICD-10-CM | POA: Diagnosis not present

## 2020-03-05 DIAGNOSIS — E1129 Type 2 diabetes mellitus with other diabetic kidney complication: Secondary | ICD-10-CM | POA: Diagnosis not present

## 2020-03-05 DIAGNOSIS — N186 End stage renal disease: Secondary | ICD-10-CM | POA: Diagnosis not present

## 2020-03-08 DIAGNOSIS — D472 Monoclonal gammopathy: Secondary | ICD-10-CM | POA: Diagnosis not present

## 2020-03-08 DIAGNOSIS — Z992 Dependence on renal dialysis: Secondary | ICD-10-CM | POA: Diagnosis not present

## 2020-03-08 DIAGNOSIS — N186 End stage renal disease: Secondary | ICD-10-CM | POA: Diagnosis not present

## 2020-03-08 DIAGNOSIS — N2581 Secondary hyperparathyroidism of renal origin: Secondary | ICD-10-CM | POA: Diagnosis not present

## 2020-03-08 DIAGNOSIS — D631 Anemia in chronic kidney disease: Secondary | ICD-10-CM | POA: Diagnosis not present

## 2020-03-08 DIAGNOSIS — E1129 Type 2 diabetes mellitus with other diabetic kidney complication: Secondary | ICD-10-CM | POA: Diagnosis not present

## 2020-03-10 DIAGNOSIS — N186 End stage renal disease: Secondary | ICD-10-CM | POA: Diagnosis not present

## 2020-03-10 DIAGNOSIS — D631 Anemia in chronic kidney disease: Secondary | ICD-10-CM | POA: Diagnosis not present

## 2020-03-10 DIAGNOSIS — E1129 Type 2 diabetes mellitus with other diabetic kidney complication: Secondary | ICD-10-CM | POA: Diagnosis not present

## 2020-03-10 DIAGNOSIS — N2581 Secondary hyperparathyroidism of renal origin: Secondary | ICD-10-CM | POA: Diagnosis not present

## 2020-03-10 DIAGNOSIS — D472 Monoclonal gammopathy: Secondary | ICD-10-CM | POA: Diagnosis not present

## 2020-03-10 DIAGNOSIS — Z992 Dependence on renal dialysis: Secondary | ICD-10-CM | POA: Diagnosis not present

## 2020-03-12 DIAGNOSIS — N2581 Secondary hyperparathyroidism of renal origin: Secondary | ICD-10-CM | POA: Diagnosis not present

## 2020-03-12 DIAGNOSIS — D472 Monoclonal gammopathy: Secondary | ICD-10-CM | POA: Diagnosis not present

## 2020-03-12 DIAGNOSIS — Z992 Dependence on renal dialysis: Secondary | ICD-10-CM | POA: Diagnosis not present

## 2020-03-12 DIAGNOSIS — E1129 Type 2 diabetes mellitus with other diabetic kidney complication: Secondary | ICD-10-CM | POA: Diagnosis not present

## 2020-03-12 DIAGNOSIS — N186 End stage renal disease: Secondary | ICD-10-CM | POA: Diagnosis not present

## 2020-03-12 DIAGNOSIS — D631 Anemia in chronic kidney disease: Secondary | ICD-10-CM | POA: Diagnosis not present

## 2020-03-15 DIAGNOSIS — N2581 Secondary hyperparathyroidism of renal origin: Secondary | ICD-10-CM | POA: Diagnosis not present

## 2020-03-15 DIAGNOSIS — N186 End stage renal disease: Secondary | ICD-10-CM | POA: Diagnosis not present

## 2020-03-15 DIAGNOSIS — E1122 Type 2 diabetes mellitus with diabetic chronic kidney disease: Secondary | ICD-10-CM | POA: Diagnosis not present

## 2020-03-15 DIAGNOSIS — Z992 Dependence on renal dialysis: Secondary | ICD-10-CM | POA: Diagnosis not present

## 2020-03-15 DIAGNOSIS — D472 Monoclonal gammopathy: Secondary | ICD-10-CM | POA: Diagnosis not present

## 2020-03-15 DIAGNOSIS — E1129 Type 2 diabetes mellitus with other diabetic kidney complication: Secondary | ICD-10-CM | POA: Diagnosis not present

## 2020-03-15 DIAGNOSIS — D631 Anemia in chronic kidney disease: Secondary | ICD-10-CM | POA: Diagnosis not present

## 2020-03-17 DIAGNOSIS — N186 End stage renal disease: Secondary | ICD-10-CM | POA: Diagnosis not present

## 2020-03-17 DIAGNOSIS — E1129 Type 2 diabetes mellitus with other diabetic kidney complication: Secondary | ICD-10-CM | POA: Diagnosis not present

## 2020-03-17 DIAGNOSIS — D631 Anemia in chronic kidney disease: Secondary | ICD-10-CM | POA: Diagnosis not present

## 2020-03-17 DIAGNOSIS — N2581 Secondary hyperparathyroidism of renal origin: Secondary | ICD-10-CM | POA: Diagnosis not present

## 2020-03-17 DIAGNOSIS — D472 Monoclonal gammopathy: Secondary | ICD-10-CM | POA: Diagnosis not present

## 2020-03-17 DIAGNOSIS — Z992 Dependence on renal dialysis: Secondary | ICD-10-CM | POA: Diagnosis not present

## 2020-03-17 LAB — COMPREHENSIVE METABOLIC PANEL
Albumin: 4.3 (ref 3.5–5.0)
Calcium: 9.4 (ref 8.7–10.7)

## 2020-03-17 LAB — BASIC METABOLIC PANEL
BUN: 72 — AB (ref 4–21)
Potassium: 5.2 (ref 3.4–5.3)

## 2020-03-19 DIAGNOSIS — Z992 Dependence on renal dialysis: Secondary | ICD-10-CM | POA: Diagnosis not present

## 2020-03-19 DIAGNOSIS — D631 Anemia in chronic kidney disease: Secondary | ICD-10-CM | POA: Diagnosis not present

## 2020-03-19 DIAGNOSIS — N186 End stage renal disease: Secondary | ICD-10-CM | POA: Diagnosis not present

## 2020-03-19 DIAGNOSIS — D472 Monoclonal gammopathy: Secondary | ICD-10-CM | POA: Diagnosis not present

## 2020-03-19 DIAGNOSIS — N2581 Secondary hyperparathyroidism of renal origin: Secondary | ICD-10-CM | POA: Diagnosis not present

## 2020-03-19 DIAGNOSIS — E1129 Type 2 diabetes mellitus with other diabetic kidney complication: Secondary | ICD-10-CM | POA: Diagnosis not present

## 2020-03-22 DIAGNOSIS — N186 End stage renal disease: Secondary | ICD-10-CM | POA: Diagnosis not present

## 2020-03-22 DIAGNOSIS — D631 Anemia in chronic kidney disease: Secondary | ICD-10-CM | POA: Diagnosis not present

## 2020-03-22 DIAGNOSIS — N2581 Secondary hyperparathyroidism of renal origin: Secondary | ICD-10-CM | POA: Diagnosis not present

## 2020-03-22 DIAGNOSIS — D472 Monoclonal gammopathy: Secondary | ICD-10-CM | POA: Diagnosis not present

## 2020-03-22 DIAGNOSIS — Z992 Dependence on renal dialysis: Secondary | ICD-10-CM | POA: Diagnosis not present

## 2020-03-22 DIAGNOSIS — E1129 Type 2 diabetes mellitus with other diabetic kidney complication: Secondary | ICD-10-CM | POA: Diagnosis not present

## 2020-03-25 DIAGNOSIS — I871 Compression of vein: Secondary | ICD-10-CM | POA: Diagnosis not present

## 2020-03-25 DIAGNOSIS — D472 Monoclonal gammopathy: Secondary | ICD-10-CM | POA: Diagnosis not present

## 2020-03-25 DIAGNOSIS — D631 Anemia in chronic kidney disease: Secondary | ICD-10-CM | POA: Diagnosis not present

## 2020-03-25 DIAGNOSIS — N2581 Secondary hyperparathyroidism of renal origin: Secondary | ICD-10-CM | POA: Diagnosis not present

## 2020-03-25 DIAGNOSIS — E1129 Type 2 diabetes mellitus with other diabetic kidney complication: Secondary | ICD-10-CM | POA: Diagnosis not present

## 2020-03-25 DIAGNOSIS — T82858A Stenosis of vascular prosthetic devices, implants and grafts, initial encounter: Secondary | ICD-10-CM | POA: Diagnosis not present

## 2020-03-25 DIAGNOSIS — Z992 Dependence on renal dialysis: Secondary | ICD-10-CM | POA: Diagnosis not present

## 2020-03-25 DIAGNOSIS — N186 End stage renal disease: Secondary | ICD-10-CM | POA: Diagnosis not present

## 2020-03-26 DIAGNOSIS — Z992 Dependence on renal dialysis: Secondary | ICD-10-CM | POA: Diagnosis not present

## 2020-03-26 DIAGNOSIS — N2581 Secondary hyperparathyroidism of renal origin: Secondary | ICD-10-CM | POA: Diagnosis not present

## 2020-03-26 DIAGNOSIS — N186 End stage renal disease: Secondary | ICD-10-CM | POA: Diagnosis not present

## 2020-03-26 DIAGNOSIS — E1129 Type 2 diabetes mellitus with other diabetic kidney complication: Secondary | ICD-10-CM | POA: Diagnosis not present

## 2020-03-26 DIAGNOSIS — D631 Anemia in chronic kidney disease: Secondary | ICD-10-CM | POA: Diagnosis not present

## 2020-03-26 DIAGNOSIS — D472 Monoclonal gammopathy: Secondary | ICD-10-CM | POA: Diagnosis not present

## 2020-03-28 DIAGNOSIS — D631 Anemia in chronic kidney disease: Secondary | ICD-10-CM | POA: Diagnosis not present

## 2020-03-28 DIAGNOSIS — E1129 Type 2 diabetes mellitus with other diabetic kidney complication: Secondary | ICD-10-CM | POA: Diagnosis not present

## 2020-03-28 DIAGNOSIS — N186 End stage renal disease: Secondary | ICD-10-CM | POA: Diagnosis not present

## 2020-03-28 DIAGNOSIS — Z992 Dependence on renal dialysis: Secondary | ICD-10-CM | POA: Diagnosis not present

## 2020-03-28 DIAGNOSIS — N2581 Secondary hyperparathyroidism of renal origin: Secondary | ICD-10-CM | POA: Diagnosis not present

## 2020-03-28 DIAGNOSIS — D472 Monoclonal gammopathy: Secondary | ICD-10-CM | POA: Diagnosis not present

## 2020-03-28 LAB — CBC AND DIFFERENTIAL: Hemoglobin: 11.8 — AB (ref 13.5–17.5)

## 2020-03-28 LAB — BASIC METABOLIC PANEL: BUN: 59 — AB (ref 4–21)

## 2020-03-30 ENCOUNTER — Ambulatory Visit: Payer: Medicare Other | Admitting: Nurse Practitioner

## 2020-03-30 DIAGNOSIS — D472 Monoclonal gammopathy: Secondary | ICD-10-CM | POA: Diagnosis not present

## 2020-03-30 DIAGNOSIS — Z992 Dependence on renal dialysis: Secondary | ICD-10-CM | POA: Diagnosis not present

## 2020-03-30 DIAGNOSIS — N186 End stage renal disease: Secondary | ICD-10-CM | POA: Diagnosis not present

## 2020-03-30 DIAGNOSIS — N2581 Secondary hyperparathyroidism of renal origin: Secondary | ICD-10-CM | POA: Diagnosis not present

## 2020-03-30 DIAGNOSIS — E1129 Type 2 diabetes mellitus with other diabetic kidney complication: Secondary | ICD-10-CM | POA: Diagnosis not present

## 2020-03-30 DIAGNOSIS — D631 Anemia in chronic kidney disease: Secondary | ICD-10-CM | POA: Diagnosis not present

## 2020-03-30 LAB — BASIC METABOLIC PANEL
Chloride: 94 — AB (ref 99–108)
Creatinine: 9.6 — AB (ref 0.6–1.3)
Glucose: 59
Sodium: 139 (ref 137–147)

## 2020-03-30 LAB — CBC: RBC: 3.33 — AB (ref 3.87–5.11)

## 2020-03-30 LAB — COMPREHENSIVE METABOLIC PANEL: Calcium: 9.6 (ref 8.7–10.7)

## 2020-03-30 LAB — CBC AND DIFFERENTIAL
HCT: 36 — AB (ref 41–53)
Hemoglobin: 12.2 — AB (ref 13.5–17.5)
Platelets: 274 (ref 150–399)
WBC: 6.6

## 2020-03-30 LAB — IRON,TIBC AND FERRITIN PANEL
Iron: 106
TIBC: 332
UIBC: 226

## 2020-03-31 ENCOUNTER — Ambulatory Visit: Payer: Medicare Other | Admitting: Nurse Practitioner

## 2020-03-31 DIAGNOSIS — N186 End stage renal disease: Secondary | ICD-10-CM | POA: Diagnosis not present

## 2020-03-31 DIAGNOSIS — D472 Monoclonal gammopathy: Secondary | ICD-10-CM | POA: Diagnosis not present

## 2020-03-31 DIAGNOSIS — E1129 Type 2 diabetes mellitus with other diabetic kidney complication: Secondary | ICD-10-CM | POA: Diagnosis not present

## 2020-03-31 DIAGNOSIS — N2581 Secondary hyperparathyroidism of renal origin: Secondary | ICD-10-CM | POA: Diagnosis not present

## 2020-03-31 DIAGNOSIS — Z992 Dependence on renal dialysis: Secondary | ICD-10-CM | POA: Diagnosis not present

## 2020-03-31 DIAGNOSIS — D631 Anemia in chronic kidney disease: Secondary | ICD-10-CM | POA: Diagnosis not present

## 2020-04-04 ENCOUNTER — Other Ambulatory Visit: Payer: Self-pay

## 2020-04-04 ENCOUNTER — Encounter: Payer: Self-pay | Admitting: Nurse Practitioner

## 2020-04-04 ENCOUNTER — Ambulatory Visit (INDEPENDENT_AMBULATORY_CARE_PROVIDER_SITE_OTHER): Payer: Medicare Other | Admitting: Nurse Practitioner

## 2020-04-04 ENCOUNTER — Encounter: Payer: Self-pay | Admitting: *Deleted

## 2020-04-04 VITALS — BP 138/84 | HR 93 | Temp 96.8°F | Ht 74.0 in | Wt 207.0 lb

## 2020-04-04 DIAGNOSIS — N185 Chronic kidney disease, stage 5: Secondary | ICD-10-CM | POA: Diagnosis not present

## 2020-04-04 DIAGNOSIS — M79674 Pain in right toe(s): Secondary | ICD-10-CM | POA: Diagnosis not present

## 2020-04-04 DIAGNOSIS — J45909 Unspecified asthma, uncomplicated: Secondary | ICD-10-CM

## 2020-04-04 DIAGNOSIS — N2581 Secondary hyperparathyroidism of renal origin: Secondary | ICD-10-CM | POA: Diagnosis not present

## 2020-04-04 DIAGNOSIS — M10379 Gout due to renal impairment, unspecified ankle and foot: Secondary | ICD-10-CM

## 2020-04-04 DIAGNOSIS — I739 Peripheral vascular disease, unspecified: Secondary | ICD-10-CM | POA: Diagnosis not present

## 2020-04-04 DIAGNOSIS — E1165 Type 2 diabetes mellitus with hyperglycemia: Secondary | ICD-10-CM

## 2020-04-04 DIAGNOSIS — E113511 Type 2 diabetes mellitus with proliferative diabetic retinopathy with macular edema, right eye: Secondary | ICD-10-CM | POA: Diagnosis not present

## 2020-04-04 DIAGNOSIS — N186 End stage renal disease: Secondary | ICD-10-CM | POA: Diagnosis not present

## 2020-04-04 DIAGNOSIS — IMO0002 Reserved for concepts with insufficient information to code with codable children: Secondary | ICD-10-CM

## 2020-04-04 DIAGNOSIS — E785 Hyperlipidemia, unspecified: Secondary | ICD-10-CM | POA: Diagnosis not present

## 2020-04-04 DIAGNOSIS — E1129 Type 2 diabetes mellitus with other diabetic kidney complication: Secondary | ICD-10-CM

## 2020-04-04 DIAGNOSIS — I5042 Chronic combined systolic (congestive) and diastolic (congestive) heart failure: Secondary | ICD-10-CM

## 2020-04-04 DIAGNOSIS — D472 Monoclonal gammopathy: Secondary | ICD-10-CM | POA: Diagnosis not present

## 2020-04-04 DIAGNOSIS — D631 Anemia in chronic kidney disease: Secondary | ICD-10-CM | POA: Diagnosis not present

## 2020-04-04 DIAGNOSIS — M79675 Pain in left toe(s): Secondary | ICD-10-CM

## 2020-04-04 DIAGNOSIS — Z992 Dependence on renal dialysis: Secondary | ICD-10-CM | POA: Diagnosis not present

## 2020-04-04 DIAGNOSIS — R600 Localized edema: Secondary | ICD-10-CM

## 2020-04-04 DIAGNOSIS — C9000 Multiple myeloma not having achieved remission: Secondary | ICD-10-CM | POA: Insufficient documentation

## 2020-04-04 LAB — URIC ACID: Uric Acid, Serum: 4.1 mg/dL (ref 4.0–8.0)

## 2020-04-04 MED ORDER — ALLOPURINOL 100 MG PO TABS: 100.0000 mg | ORAL_TABLET | Freq: Every day | ORAL | 6 refills | Status: DC

## 2020-04-04 MED ORDER — ALLOPURINOL 100 MG PO TABS: ORAL_TABLET | ORAL | 1 refills | Status: DC

## 2020-04-04 NOTE — Progress Notes (Incomplete)
Careteam: Patient Care Team: [name], NP as PCP - General (Geriatric Medicine) Jerline Pain, MD as PCP - Cardiology (Cardiology) [name], MD as Consulting Physician (Gastroenterology) Zadie Rhine Clent Demark, MD as Consulting Physician (Ophthalmology) Elmarie Shiley, MD as Consulting Physician (Nephrology) Derek Jack, MD as Consulting Physician (Hematology)  PLACE OF SERVICE:  [place] Directive information Does Patient Have a Medical Advance Directive?: Yes, Type of Advance Directive: Out of facility DNR (pink MOST or yellow form), Pre-existing out of facility DNR order (yellow form or pink MOST form): Pink MOST form placed in chart (order not valid for inpatient use), Does patient want to make changes to medical advance directive?: No - Patient declined  No Known Allergies  Chief Complaint  Patient presents with  . Medical Management of Chronic Issues    4 month follow-up. Discuss need for colonoscopy. Patient with diagnosis of gout per labs from dialysis ( I will call to request results)     HPI: Patient is a 70 y.o. male for follow up  Reports he was having a lot of pain to his right toe, started drinking tart cherry juice and it "cooled now" but now in left big toe. Told PA about this at [facility] and he ordered a uric acid level and it was elevated.  Tylenol is working good to control the pain.  He had to stop tart cherry juice due to elevated phosphate.  ESRD- going through a lot of test at the transplant center at [facility] and in [place]. Looking to transplant. And went for 2 days of testing and going back for 2 more days.   DM- report dialysis did A1c and it was 7. No low blood sugars. Been very compliant with medication   CHF- controlled with HD.    Review of Systems:  ROS***  Past Medical History:  Diagnosis Date  . Abnormal stress test 02/10/2015  . Acute on chronic systolic and diastolic heart failure, NYHA class 1 ([place])  12/31/2014  . Anemia   . Anemia in chronic kidney disease 09/03/2012  . 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 Asthma   . Asthma, chronic 06/04/2012  . Asthma, chronic, mild intermittent, with acute exacerbation 12/22/2015  . Bilateral lower extremity edema 12/22/2015  . CAD (coronary artery disease) 02/18/2015  . Chronic combined systolic (congestive) and diastolic (congestive) heart failure ([place])    a. 12/31/14: 2D ECHO: EF 40-45%, HK of inf myocardium, G1DD, mod MR  . Chronic combined systolic and diastolic CHF (congestive heart failure) ([place]) 02/10/2015  . Chronic systolic heart failure ([place]) 02/25/2015  . CKD (chronic kidney disease), stage III (HCC)    stage 3 kidney disease  . CKD (chronic kidney disease), stage V ([place]) 08/30/2018  . COLONIC POLYPS, HX OF 04/13/2009   Qualifier: Diagnosis of  By: West[place] Pam.   . Coronary artery disease    a. LHC 01/2015 - triple vessel CAD (mod oLM, mLAD, severe mRCA, intermediate branch stenosis, CTO of mCx). Plan CABG 02/2015.  Marland Kitchen Demand ischemia ([place]) 12/31/2014  . DM type 2, uncontrolled, with renal complications ([place]) 02/20/3233  . Dyspnea     due to fluid  . Elevated troponin   . Essential hypertension 10/09/2013  . Folliculitis of perineum 10/01/2012  . Gastric erosion   . GERD (gastroesophageal reflux disease)   . History of blood transfusion   . Hyperlipidemia   . Hyperlipidemia with target LDL less than 100  12/31/2014  . Hypertension   . Hypertensive heart disease with heart failure ([place]) 02/25/2015  . IDA (iron deficiency anemia) 01/23/2016  . Melena 01/23/2016  . 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 Mitral regurgitation    a. Mild-mod by echo 12/2014.  . Morbid obesity ([place]) 12/29/2014  . Myocardial infarction [facility])    pt. states per [name] he has in the past  . NSVT (nonsustained ventricular tachycardia) ([place])    a. 9 beats during 01/2015 adm. BB titrated.  . Obesity    a. BMI 33  . Obesity (BMI  30-39.9) 05/05/2013  . OSA (obstructive sleep apnea) 01/16/2018   Mild obstructive sleep apnea overall with an AHI of 7.3/h and no significant central sleep apnea. Severe obstructive sleep apnea during REM sleep with an AHI of 34.3/h.  Now on CPAP at 6 cm H2O.   . Other malaise and fatigue 09/03/2012  . 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 PAD (peripheral artery disease) ([place]) 03/02/2015  . Pneumonia   . Sleep apnea    2019, Dr.Turner diagnoised   . Small bowel lesion   . Symptomatic anemia 08/29/2018  . 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 Type II diabetes mellitus ([place])   . Wears dentures    Past Surgical History:  Procedure Laterality Date  . BACK SURGERY    . BASCILIC VEIN TRANSPOSITION Left 11/14/2018   Procedure: FIRST STAGE BASILIC VEIN TRANSPOSITION LEFT ARM;  Surgeon: Serafina Mitchell, MD;  Location: [place];  Service: Vascular;  Laterality: Left;  . BASCILIC VEIN TRANSPOSITION Left 01/28/2019   Procedure: BASILIC VEIN TRANSPOSITION SECOND STAGE;  Surgeon: Serafina Mitchell, MD;  Location: [place];  Service: Vascular;  Laterality: Left;  . BONE MARROW BIOPSY    . CARDIAC CATHETERIZATION N/A 02/09/2015   Procedure: Left Heart Cath and Coronary Angiography;  Surgeon: Burnell Blanks, MD;  Location: [place] [facility];  Service: Cardiovascular;  Laterality: N/A;  . COLONOSCOPY  2011   Dr. [name]: Ileocecal valve appeared normal, scattered pancolonic diverticulosis, difficult bowel prep making smaller lesions potentially missed. Recommended three-year follow-up colonoscopy.  . COLONOSCOPY  2003   Dr. [name]: Suspicious lesion at the ileocecal valve, multiple biopsies benign, pancolonic diverticulosis.  . COLONOSCOPY N/A 02/15/2016   diverticulosis in sigmoid and descending colon, single 5 mm polyp at splenic flexure (Tubular adenoma)  . CORONARY ARTERY BYPASS GRAFT N/A 02/18/2015   Procedure: CORONARY ARTERY BYPASS GRAFTING (CABG) x  four, using bilateral internal mammary  arteries and right leg greater saphenous vein harvested endoscopically;  Surgeon: [name], MD;  Location: [facility];  Service: Open Heart Surgery;  Laterality: N/A;  . ESOPHAGOGASTRODUODENOSCOPY N/A 02/15/2016   normal  . EYE SURGERY Right    July 2019   . GIVENS CAPSULE STUDY N/A 01/08/2017   couple of gastric and small bowel eroions in setting of aspirin 81 mg daily but nothing concerning, continue Hematology follow-up  . HERNIA REPAIR  0258   Umbilical  . LUMBAR [place] SURGERY  2006   L4 & L5  . REFRACTIVE SURGERY Left    2019   . REFRACTIVE SURGERY Right    2019  . TEE WITHOUT CARDIOVERSION N/A 02/18/2015   Procedure: TRANSESOPHAGEAL ECHOCARDIOGRAM (TEE);  Surgeon: [name], MD;  Location: [place];  Service: Open Heart Surgery;  Laterality: N/A;  . TONSILLECTOMY  1962   Social History:   reports that he quit smoking about 5 years ago. His smoking use included cigars.  He quit after 17.00 years of use. He has never used smokeless tobacco. He reports current alcohol use of about 3.0 standard drinks of alcohol per week. He reports that he does not use drugs.  Family History  Problem Relation Age of Onset  . Diabetes Mother   . Heart disease Mother        later in life, age >9  . Heart disease Father        heart failure later in life  . Hypertension Father   . Stroke Sister   . Kidney disease Daughter   . Sudden death Daughter   . Colon cancer Paternal Uncle        Passed from colon CA  . Heart attack Neg Hx     Medications: Patient's Medications  New Prescriptions   No medications on file  Previous Medications   ACETAMINOPHEN (TYLENOL) 500 MG TABLET    Take 1,000 mg by mouth 2 (two) times daily as needed for moderate pain.   ADVAIR DISKUS 100-50 MCG/DOSE AEPB    SMARTSIG:1 Puff(s) Via Inhaler Twice Daily PRN   AMLODIPINE (NORVASC) 10 MG TABLET    Take 1 tablet by mouth once daily   ASPIRIN EC 81 MG TABLET    Take 1 tablet (81 mg total) by mouth daily.   ATORVASTATIN  (LIPITOR) 40 MG TABLET    TAKE 1 TABLET BY MOUTH ONCE DAILY IN THE EVENING   CARVEDILOL (COREG) 12.5 MG TABLET    Take 12.5 mg by mouth daily.    CHOLECALCIFEROL (VITAMIN D3) 25 MCG (1000 UT) CAPS    Take 2 capsules by mouth daily.   CINNAMON 500 MG CAPSULE    Take 1,000 mg by mouth daily.    COENZYME Q10 10 MG CAPSULE    Take 10 mg by mouth daily.   EPOETIN ALFA-EPBX (RETACRIT) 82423 UNIT/ML INJECTION    20,000 Units every 14 (fourteen) days.    EZETIMIBE (ZETIA) 10 MG TABLET    Take 1 tablet (10 mg total) by mouth daily.   FLUTICASONE (FLONASE) 50 MCG/ACT NASAL SPRAY    Place 2 sprays into both nostrils daily as needed for allergies or rhinitis (SEASONAL ALLERGIES).   FUROSEMIDE (LASIX) 40 MG TABLET    Take 40 mg by mouth. On S-M-W-F Takes  1 tablet by mouth once d   GLUCOSE BLOOD (ONE TOUCH ULTRA TEST) TEST STRIP    Use to test blood sugar three times daily. Dx: E11.21   INSULIN DETEMIR (LEVEMIR) 100 UNIT/ML INJECTION    Inject 0.18 mLs (18 Units total) into the skin daily. In the morning   ISOSORBIDE MONONITRATE (IMDUR) 30 MG 24 HR TABLET    TAKE 1 TABLET BY MOUTH ONCE DAILY -- PATIENT NEEDS APPOINTMENT FOR FUTURE REFILLS   MONTELUKAST (SINGULAIR) 10 MG TABLET    TAKE 1 TABLET BY MOUTH AT BEDTIME   MULTIPLE VITAMIN (MULTIVITAMIN WITH MINERALS) TABS TABLET    Take 1 tablet by mouth daily.   NITROGLYCERIN (NITROSTAT) 0.4 MG SL TABLET    Place 1 tablet (0.4 mg total) under the tongue every 5 (five) minutes as needed for chest pain.   PROAIR HFA 108 (90 BASE) MCG/ACT INHALER    INHALE 2 PUFFS BY MOUTH EVERY 6 HOURS AS NEEDED FOR SHORTNESS OF BREATH   SEVELAMER CARBONATE (RENVELA) 800 MG TABLET    Take 800 mg by mouth 3 (three) times daily with meals.  Modified Medications   No medications on file  Discontinued Medications  HYDRALAZINE (APRESOLINE) 50 MG TABLET    Take 25 mg by mouth daily. Taking just one tablet a day - 25 mg   LAMISIL AT 1 % CREAM    SMARTSIG:Sparingly Topical PRN     Physical Exam:  Vitals:   04/04/20 1511  BP: 138/84  Pulse: 93  Temp: (!) 96.8 F (36 C)  TempSrc: Temporal  SpO2: 97%  Weight: 207 lb (93.9 kg)  Height: $Remove'6\' 2"'dvlkyHh$  (1.88 m)   Body mass index is 26.58 kg/m. Wt Readings from Last 3 Encounters:  04/04/20 207 lb (93.9 kg)  01/15/20 204 lb (92.5 kg)  11/23/19 201 lb 12.8 oz (91.5 kg)    Physical Exam***  Labs reviewed: Basic Metabolic Panel: Recent Labs    06/15/19 1207 07/15/19 1039 08/12/19 0943 08/12/19 1054 10/05/19 0916 10/05/19 1345  NA 138 135  --  135 135 133*  K 4.3 3.9  --  3.9 4.4 3.9  CL 103 98  --  100 93* 94*  CO2 22 21*  --  21* 24 24  GLUCOSE 219* 179*  --  249* 250* 231*  BUN 146* 132*  --  138* 74* 83*  CREATININE 6.99* 6.93*  --  7.05* 6.60* 7.00*  CALCIUM 8.8* 8.8*  --  8.9 9.2 9.0  MG  --   --  2.0  --   --   --   PHOS 5.1* 6.4*  --  5.2*  --   --    Liver Function Tests: Recent Labs    10/05/19 0916 10/05/19 1345 11/16/19 0930  AST $Re'20 22 24  'mLJ$ ALT $R'14 18 15  'yl$ ALKPHOS 70 60 65  BILITOT 0.2 0.3 0.2  PROT 6.5 6.8 7.0  ALBUMIN 4.1 3.8 4.6   No results for input(s): LIPASE, AMYLASE in the last 8760 hours. No results for input(s): AMMONIA in the last 8760 hours. CBC: Recent Labs    08/12/19 0943 08/12/19 0957 08/26/19 0843 10/05/19 0916 10/05/19 1345  WBC 5.1  --   --  5.3 5.5  NEUTROABS 3.6  --   --  3.0 3.2  HGB 9.8*   < > 10.0* 10.8* 11.0*  HCT 29.9*  --   --  32.6* 34.3*  MCV 99.0  --   --  98* 99.4  PLT 173  --   --  212 218   < > = values in this interval not displayed.   Lipid Panel: Recent Labs    10/05/19 0916 11/16/19 0930  CHOL 163 127  HDL 50 60  LDLCALC 99 52  TRIG 71 74  CHOLHDL 3.3 2.1   TSH: No results for input(s): TSH in the last 8760 hours. A1C: Lab Results  Component Value Date   HGBA1C 8.6 (H) 11/23/2019     Assessment/Plan There are no diagnoses linked to this encounter.  Next appt: *** Lashanta Elbe K. [place], [place]  [facility] [phone]

## 2020-04-04 NOTE — Patient Instructions (Signed)
Please remind dialysis to send over any lab work so we can update in your chart.  Thank you

## 2020-04-06 ENCOUNTER — Inpatient Hospital Stay (HOSPITAL_COMMUNITY): Payer: Medicare Other | Attending: Hematology

## 2020-04-06 ENCOUNTER — Other Ambulatory Visit: Payer: Self-pay

## 2020-04-06 DIAGNOSIS — D472 Monoclonal gammopathy: Secondary | ICD-10-CM | POA: Diagnosis not present

## 2020-04-06 DIAGNOSIS — Z992 Dependence on renal dialysis: Secondary | ICD-10-CM | POA: Diagnosis not present

## 2020-04-06 DIAGNOSIS — I871 Compression of vein: Secondary | ICD-10-CM | POA: Diagnosis not present

## 2020-04-06 DIAGNOSIS — D631 Anemia in chronic kidney disease: Secondary | ICD-10-CM | POA: Diagnosis not present

## 2020-04-06 DIAGNOSIS — N186 End stage renal disease: Secondary | ICD-10-CM | POA: Diagnosis not present

## 2020-04-06 DIAGNOSIS — T82858A Stenosis of vascular prosthetic devices, implants and grafts, initial encounter: Secondary | ICD-10-CM | POA: Diagnosis not present

## 2020-04-06 DIAGNOSIS — E1129 Type 2 diabetes mellitus with other diabetic kidney complication: Secondary | ICD-10-CM | POA: Diagnosis not present

## 2020-04-06 DIAGNOSIS — N2581 Secondary hyperparathyroidism of renal origin: Secondary | ICD-10-CM | POA: Diagnosis not present

## 2020-04-06 LAB — COMPREHENSIVE METABOLIC PANEL
ALT: 14 U/L (ref 0–44)
AST: 20 U/L (ref 15–41)
Albumin: 4.3 g/dL (ref 3.5–5.0)
Alkaline Phosphatase: 57 U/L (ref 38–126)
Anion gap: 15 (ref 5–15)
BUN: 31 mg/dL — ABNORMAL HIGH (ref 8–23)
CO2: 32 mmol/L (ref 22–32)
Calcium: 9.6 mg/dL (ref 8.9–10.3)
Chloride: 92 mmol/L — ABNORMAL LOW (ref 98–111)
Creatinine, Ser: 5.43 mg/dL — ABNORMAL HIGH (ref 0.61–1.24)
GFR, Estimated: 10 mL/min — ABNORMAL LOW (ref >60)
Glucose, Bld: 128 mg/dL — ABNORMAL HIGH (ref 70–99)
Potassium: 3.6 mmol/L (ref 3.5–5.1)
Sodium: 139 mmol/L (ref 135–145)
Total Bilirubin: 0.7 mg/dL (ref 0.3–1.2)
Total Protein: 7.8 g/dL (ref 6.5–8.1)

## 2020-04-06 LAB — CBC WITH DIFFERENTIAL/PLATELET
Abs Immature Granulocytes: 0.01 10*3/uL (ref 0.00–0.07)
Basophils Absolute: 0 10*3/uL (ref 0.0–0.1)
Basophils Relative: 1 %
Eosinophils Absolute: 0.1 10*3/uL (ref 0.0–0.5)
Eosinophils Relative: 2 %
HCT: 37.5 % — ABNORMAL LOW (ref 39.0–52.0)
Hemoglobin: 12.6 g/dL — ABNORMAL LOW (ref 13.0–17.0)
Immature Granulocytes: 0 %
Lymphocytes Relative: 24 %
Lymphs Abs: 1.2 10*3/uL (ref 0.7–4.0)
MCH: 36.2 pg — ABNORMAL HIGH (ref 26.0–34.0)
MCHC: 33.6 g/dL (ref 30.0–36.0)
MCV: 107.8 fL — ABNORMAL HIGH (ref 80.0–100.0)
Monocytes Absolute: 0.7 10*3/uL (ref 0.1–1.0)
Monocytes Relative: 13 %
Neutro Abs: 3.2 10*3/uL (ref 1.7–7.7)
Neutrophils Relative %: 60 %
Platelets: 192 10*3/uL (ref 150–400)
RBC: 3.48 MIL/uL — ABNORMAL LOW (ref 4.22–5.81)
RDW: 14 % (ref 11.5–15.5)
WBC: 5.2 10*3/uL (ref 4.0–10.5)
nRBC: 0 % (ref 0.0–0.2)

## 2020-04-06 LAB — LACTATE DEHYDROGENASE: LDH: 230 U/L — ABNORMAL HIGH (ref 98–192)

## 2020-04-07 ENCOUNTER — Encounter (INDEPENDENT_AMBULATORY_CARE_PROVIDER_SITE_OTHER): Payer: Medicare Other | Admitting: Ophthalmology

## 2020-04-07 ENCOUNTER — Ambulatory Visit (INDEPENDENT_AMBULATORY_CARE_PROVIDER_SITE_OTHER): Payer: Medicare Other | Admitting: Ophthalmology

## 2020-04-07 ENCOUNTER — Other Ambulatory Visit: Payer: Self-pay

## 2020-04-07 ENCOUNTER — Encounter (INDEPENDENT_AMBULATORY_CARE_PROVIDER_SITE_OTHER): Payer: Self-pay | Admitting: Ophthalmology

## 2020-04-07 DIAGNOSIS — H4311 Vitreous hemorrhage, right eye: Secondary | ICD-10-CM

## 2020-04-07 DIAGNOSIS — H4312 Vitreous hemorrhage, left eye: Secondary | ICD-10-CM | POA: Diagnosis not present

## 2020-04-07 DIAGNOSIS — H2512 Age-related nuclear cataract, left eye: Secondary | ICD-10-CM | POA: Diagnosis not present

## 2020-04-07 DIAGNOSIS — H2511 Age-related nuclear cataract, right eye: Secondary | ICD-10-CM | POA: Diagnosis not present

## 2020-04-07 DIAGNOSIS — E113512 Type 2 diabetes mellitus with proliferative diabetic retinopathy with macular edema, left eye: Secondary | ICD-10-CM | POA: Diagnosis not present

## 2020-04-07 DIAGNOSIS — E113511 Type 2 diabetes mellitus with proliferative diabetic retinopathy with macular edema, right eye: Secondary | ICD-10-CM

## 2020-04-07 LAB — KAPPA/LAMBDA LIGHT CHAINS
Kappa free light chain: 115.9 mg/L — ABNORMAL HIGH (ref 3.3–19.4)
Kappa, lambda light chain ratio: 0.65 (ref 0.26–1.65)
Lambda free light chains: 177.9 mg/L — ABNORMAL HIGH (ref 5.7–26.3)

## 2020-04-07 NOTE — Assessment & Plan Note (Signed)
New onset, significant opacification will likely need vitrectomy yet needed moderate nuclear sclerotic cataract moved via performance of cataract extraction with intraocular lens placement under the direction of Dr. Gershon Crane

## 2020-04-07 NOTE — Progress Notes (Signed)
Careteam: Patient Care Team: [name], NP as PCP - General (Geriatric Medicine) Jerline Pain, MD as PCP - Cardiology (Cardiology) [name], MD as Consulting Physician (Gastroenterology) Zadie Rhine Clent Demark, MD as Consulting Physician (Ophthalmology) Elmarie Shiley, MD as Consulting Physician (Nephrology) Derek Jack, MD as Consulting Physician (Hematology)  PLACE OF SERVICE:  [place] Directive information Does Patient Have a Medical Advance Directive?: Yes, Type of Advance Directive: Out of facility DNR (pink MOST or yellow form), Pre-existing out of facility DNR order (yellow form or pink MOST form): Pink MOST form placed in chart (order not valid for inpatient use), Does patient want to make changes to medical advance directive?: No - Patient declined  No Known Allergies  Chief Complaint  Patient presents with  . Medical Management of Chronic Issues    4 month follow-up. Discuss need for colonoscopy. Patient with diagnosis of gout per labs from dialysis ( I will call to request results)     HPI: Patient is a 70 y.o. male for routine follow up.   Reports he was having a lot of pain to his right toe, started drinking tart cherry juice and it "cooled now" but now in left big toe. Told PA about this at [facility] and he ordered a uric acid level and it was elevated.  Tylenol is working good to control the pain.  He had to stop tart cherry juice due to elevated phosphate.  ESRD- going through a lot of test at the transplant center at [facility] and in [place]. Looking to transplant. And went for 2 days of testing and going back for 2 more days.   DM- report dialysis did A1c and it was 7. No low blood sugars. Been very compliant with medication   CHF- controlled with HD and lasix.   Hyperlipidemia- continues on zetia and lipitor  Hypertension- controlled on HD with norvasc, coreg  asthma- controlled on advair, no recent flares.  Review of  Systems:  Review of Systems  Constitutional: Negative for chills, fever and weight loss.  HENT: Negative for hearing loss and tinnitus.   Respiratory: Negative for cough, sputum production and shortness of breath.   Cardiovascular: Negative for chest pain, palpitations and leg swelling.  Gastrointestinal: Negative for abdominal pain, constipation, diarrhea and heartburn.  Genitourinary: Negative for dysuria, frequency and urgency.  Musculoskeletal: Positive for myalgias. Negative for back pain, falls and joint pain.  Skin: Negative.   Neurological: Negative for dizziness and headaches.  Psychiatric/Behavioral: Negative for depression and memory loss. The patient does not have insomnia.     Past Medical History:  Diagnosis Date  . Abnormal stress test 02/10/2015  . Acute on chronic systolic and diastolic heart failure, NYHA class 1 ([place]) 12/31/2014  . Anemia   . Anemia in chronic kidney disease 09/03/2012  . 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 Asthma   . Asthma, chronic 06/04/2012  . Asthma, chronic, mild intermittent, with acute exacerbation 12/22/2015  . Bilateral lower extremity edema 12/22/2015  . CAD (coronary artery disease) 02/18/2015  . Chronic combined systolic (congestive) and diastolic (congestive) heart failure ([place])    a. 12/31/14: 2D ECHO: EF 40-45%, HK of inf myocardium, G1DD, mod MR  . Chronic combined systolic and diastolic CHF (congestive heart failure) ([place]) 02/10/2015  . Chronic systolic heart failure ([place]) 02/25/2015  . CKD (chronic kidney disease), stage III (HCC)    stage 3 kidney disease  . CKD (chronic kidney disease),  stage V ([place]) 08/30/2018  . COLONIC POLYPS, HX OF 04/13/2009   Qualifier: Diagnosis of  By: Westly Pam.   . Coronary artery disease    a. LHC 01/2015 - triple vessel CAD (mod oLM, mLAD, severe mRCA, intermediate branch stenosis, CTO of mCx). Plan CABG 02/2015.  Marland Kitchen Demand ischemia ([place]) 12/31/2014  . DM type 2, uncontrolled, with renal  complications ([place]) 1/61/0960  . Dyspnea     due to fluid  . Elevated troponin   . Essential hypertension 10/09/2013  . Folliculitis of perineum 10/01/2012  . Gastric erosion   . GERD (gastroesophageal reflux disease)   . History of blood transfusion   . Hyperlipidemia   . Hyperlipidemia with target LDL less than 100 12/31/2014  . Hypertension   . Hypertensive heart disease with heart failure ([place]) 02/25/2015  . IDA (iron deficiency anemia) 01/23/2016  . Melena 01/23/2016  . 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 Mitral regurgitation    a. Mild-mod by echo 12/2014.  . Morbid obesity ([place]) 12/29/2014  . Myocardial infarction [facility])    pt. states per [name] he has in the past  . NSVT (nonsustained ventricular tachycardia) ([place])    a. 9 beats during 01/2015 adm. BB titrated.  . Obesity    a. BMI 33  . Obesity (BMI 30-39.9) 05/05/2013  . OSA (obstructive sleep apnea) 01/16/2018   Mild obstructive sleep apnea overall with an AHI of 7.3/h and no significant central sleep apnea. Severe obstructive sleep apnea during REM sleep with an AHI of 34.3/h.  Now on CPAP at 6 cm H2O.   . Other malaise and fatigue 09/03/2012  . 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 PAD (peripheral artery disease) ([place]) 03/02/2015  . Pneumonia   . Sleep apnea    2019, Dr.Turner diagnoised   . Small bowel lesion   . Symptomatic anemia 08/29/2018  . 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 Type II diabetes mellitus ([place])   . Wears dentures    Past Surgical History:  Procedure Laterality Date  . BACK SURGERY    . BASCILIC VEIN TRANSPOSITION Left 11/14/2018   Procedure: FIRST STAGE BASILIC VEIN TRANSPOSITION LEFT ARM;  Surgeon: Serafina Mitchell, MD;  Location: [facility];  Service: Vascular;  Laterality: Left;  . BASCILIC VEIN TRANSPOSITION Left 01/28/2019   Procedure: BASILIC VEIN TRANSPOSITION SECOND STAGE;  Surgeon: Serafina Mitchell, MD;  Location: [facility];  Service: Vascular;   Laterality: Left;  . BONE MARROW BIOPSY    . CARDIAC CATHETERIZATION N/A 02/09/2015   Procedure: Left Heart Cath and Coronary Angiography;  Surgeon: Burnell Blanks, MD;  Location: [facility];  Service: Cardiovascular;  Laterality: N/A;  . COLONOSCOPY  2011   Dr. [name]: Ileocecal valve appeared normal, scattered pancolonic diverticulosis, difficult bowel prep making smaller lesions potentially missed. Recommended three-year follow-up colonoscopy.  . COLONOSCOPY  2003   Dr. [name]: Suspicious lesion at the ileocecal valve, multiple biopsies benign, pancolonic diverticulosis.  . COLONOSCOPY N/A 02/15/2016   diverticulosis in sigmoid and descending colon, single 5 mm polyp at splenic flexure (Tubular adenoma)  . CORONARY ARTERY BYPASS GRAFT N/A 02/18/2015   Procedure: CORONARY ARTERY BYPASS GRAFTING (CABG) x  four, using bilateral internal mammary arteries and right leg greater saphenous vein harvested endoscopically;  Surgeon: [name], MD;  Location: [facility];  Service: Open Heart Surgery;  Laterality: N/A;  . ESOPHAGOGASTRODUODENOSCOPY N/A 02/15/2016   normal  . EYE SURGERY Right    July  2019   . GIVENS CAPSULE STUDY N/A 01/08/2017   couple of gastric and small bowel eroions in setting of aspirin 81 mg daily but nothing concerning, continue Hematology follow-up  . HERNIA REPAIR  7616   Umbilical  . LUMBAR [place] SURGERY  2006   L4 & L5  . REFRACTIVE SURGERY Left    2019   . REFRACTIVE SURGERY Right    2019  . TEE WITHOUT CARDIOVERSION N/A 02/18/2015   Procedure: TRANSESOPHAGEAL ECHOCARDIOGRAM (TEE);  Surgeon: [name], MD;  Location: [facility];  Service: Open Heart Surgery;  Laterality: N/A;  . TONSILLECTOMY  1962   Social History:   reports that he quit smoking about 5 years ago. His smoking use included cigars. He quit after 17.00 years of use. He has never used smokeless tobacco. He reports current alcohol use of about 3.0 standard drinks of alcohol per week. He reports  that he does not use drugs.  Family History  Problem Relation Age of Onset  . Diabetes Mother   . Heart disease Mother        later in life, age >33  . Heart disease Father        heart failure later in life  . Hypertension Father   . Stroke Sister   . Kidney disease Daughter   . Sudden death Daughter   . Colon cancer Paternal Uncle        Passed from colon CA  . Heart attack Neg Hx     Medications: Patient's Medications  New Prescriptions   No medications on file  Previous Medications   ACETAMINOPHEN (TYLENOL) 500 MG TABLET    Take 1,000 mg by mouth 2 (two) times daily as needed for moderate pain.   ADVAIR DISKUS 100-50 MCG/DOSE AEPB    SMARTSIG:1 Puff(s) Via Inhaler Twice Daily PRN   AMLODIPINE (NORVASC) 10 MG TABLET    Take 1 tablet by mouth once daily   ASPIRIN EC 81 MG TABLET    Take 1 tablet (81 mg total) by mouth daily.   ATORVASTATIN (LIPITOR) 40 MG TABLET    TAKE 1 TABLET BY MOUTH ONCE DAILY IN THE EVENING   CARVEDILOL (COREG) 12.5 MG TABLET    Take 12.5 mg by mouth daily.    CHOLECALCIFEROL (VITAMIN D3) 25 MCG (1000 UT) CAPS    Take 2 capsules by mouth daily.   CINNAMON 500 MG CAPSULE    Take 1,000 mg by mouth daily.    COENZYME Q10 10 MG CAPSULE    Take 10 mg by mouth daily.   EPOETIN ALFA-EPBX (RETACRIT) 07371 UNIT/ML INJECTION    20,000 Units every 14 (fourteen) days.    EZETIMIBE (ZETIA) 10 MG TABLET    Take 1 tablet (10 mg total) by mouth daily.   FLUTICASONE (FLONASE) 50 MCG/ACT NASAL SPRAY    Place 2 sprays into both nostrils daily as needed for allergies or rhinitis (SEASONAL ALLERGIES).   FUROSEMIDE (LASIX) 40 MG TABLET    Take 40 mg by mouth. On S-M-W-F Takes  1 tablet by mouth once d   GLUCOSE BLOOD (ONE TOUCH ULTRA TEST) TEST STRIP    Use to test blood sugar three times daily. Dx: E11.21   INSULIN DETEMIR (LEVEMIR) 100 UNIT/ML INJECTION    Inject 0.18 mLs (18 Units total) into the skin daily. In the morning   ISOSORBIDE MONONITRATE (IMDUR) 30 MG 24 HR  TABLET    TAKE 1 TABLET BY MOUTH ONCE DAILY -- PATIENT NEEDS APPOINTMENT  FOR FUTURE REFILLS   MONTELUKAST (SINGULAIR) 10 MG TABLET    TAKE 1 TABLET BY MOUTH AT BEDTIME   MULTIPLE VITAMIN (MULTIVITAMIN WITH MINERALS) TABS TABLET    Take 1 tablet by mouth daily.   NITROGLYCERIN (NITROSTAT) 0.4 MG SL TABLET    Place 1 tablet (0.4 mg total) under the tongue every 5 (five) minutes as needed for chest pain.   PROAIR HFA 108 (90 BASE) MCG/ACT INHALER    INHALE 2 PUFFS BY MOUTH EVERY 6 HOURS AS NEEDED FOR SHORTNESS OF BREATH   SEVELAMER CARBONATE (RENVELA) 800 MG TABLET    Take 800 mg by mouth 3 (three) times daily with meals.  Modified Medications   No medications on file  Discontinued Medications   HYDRALAZINE (APRESOLINE) 50 MG TABLET    Take 25 mg by mouth daily. Taking just one tablet a day - 25 mg   LAMISIL AT 1 % CREAM    SMARTSIG:Sparingly Topical PRN    Physical Exam:  Vitals:   04/04/20 1511  BP: 138/84  Pulse: 93  Temp: (!) 96.8 F (36 C)  TempSrc: Temporal  SpO2: 97%  Weight: 207 lb (93.9 kg)  Height: $Remove'6\' 2"'depvVkS$  (1.88 m)   Body mass index is 26.58 kg/m. Wt Readings from Last 3 Encounters:  04/04/20 207 lb (93.9 kg)  01/15/20 204 lb (92.5 kg)  11/23/19 201 lb 12.8 oz (91.5 kg)    Physical Exam Constitutional:      General: He is not in acute distress.    Appearance: He is well-developed and well-nourished. He is not diaphoretic.  HENT:     Head: Normocephalic and atraumatic.     Mouth/Throat:     Mouth: Oropharynx is clear and moist.     Pharynx: No oropharyngeal exudate.  Eyes:     Extraocular Movements: EOM normal.     Conjunctiva/sclera: Conjunctivae normal.     Pupils: Pupils are equal, round, and reactive to light.  Cardiovascular:     Rate and Rhythm: Normal rate and regular rhythm.     Heart sounds: Normal heart sounds.  Pulmonary:     Effort: Pulmonary effort is normal.     Breath sounds: Normal breath sounds.  Abdominal:     General: Bowel sounds are  normal.     Palpations: Abdomen is soft.  Musculoskeletal:        General: No tenderness or edema.     Cervical back: Normal range of motion and neck supple.  Feet:     Right foot:     Skin integrity: Skin integrity normal.     Comments: Discoloration and tenderness noted to medial left great toenail. No drainage or heat noted.  Skin:    General: Skin is warm and dry.  Neurological:     Mental Status: He is alert and oriented to person, place, and time.  Psychiatric:        Mood and Affect: Mood and affect and mood normal.        Behavior: Behavior normal.     Labs reviewed: Basic Metabolic Panel: Recent Labs    06/15/19 1207 07/15/19 1039 08/12/19 0943 08/12/19 1054 08/12/19 1054 10/05/19 0916 10/05/19 1345 02/18/20 0000 03/03/20 0000 03/17/20 0000 03/28/20 0000 03/30/20 0000 04/06/20 1118  NA 138 135  --  135   < > 135 133*  --  136*  --   --  139 139  K 4.3 3.9  --  3.9  --  4.4 3.9 4.9  --  5.2  --   --  3.6  CL 103 98  --  100  --  93* 94*  --  92*  --   --  94* 92*  CO2 22 21*  --  21*  --  24 24  --   --   --   --   --  32  GLUCOSE 219* 179*  --  249*  --  250* 231*  --   --   --   --   --  128*  BUN 146* 132*  --  138*   < > 74* 83* 60*  --  72* 59*  --  31*  CREATININE 6.99* 6.93*  --  7.05*  --  6.60* 7.00*  --  11.1*  --   --  9.6* 5.43*  CALCIUM 8.8* 8.8*  --  8.9  --  9.2 9.0 9.3 9.6 9.4  --  9.6 9.6  MG  --   --  2.0  --   --   --   --   --   --   --   --   --   --   PHOS 5.1* 6.4*  --  5.2*  --   --   --   --   --   --   --   --   --   TSH  --   --   --   --   --   --   --   --  0.93  --   --   --   --    < > = values in this interval not displayed.   Liver Function Tests: Recent Labs    10/05/19 1345 11/16/19 0930 03/03/20 0000 03/17/20 0000 04/06/20 1118  AST $Re'22 24 14  'dqW$ --  20  ALT 18 15  --   --  14  ALKPHOS 60 65 56  --  57  BILITOT 0.3 0.2  --   --  0.7  PROT 6.8 7.0  --   --  7.8  ALBUMIN 3.8 4.6  --  4.3 4.3   No results for  input(s): LIPASE, AMYLASE in the last 8760 hours. No results for input(s): AMMONIA in the last 8760 hours. CBC: Recent Labs    10/05/19 0916 10/05/19 1345 03/03/20 0000 03/28/20 0000 03/30/20 0000 04/06/20 1118  WBC 5.3 5.5 6.4  --  6.6 5.2  NEUTROABS 3.0 3.2  --   --   --  3.2  HGB 10.8* 11.0* 11.5* 11.8* 12.2* 12.6*  HCT 32.6* 34.3* 34*  --  36* 37.5*  MCV 98* 99.4  --   --   --  107.8*  PLT 212 218 224  --  274 192   Lipid Panel: Recent Labs    10/05/19 0916 11/16/19 0930  CHOL 163 127  HDL 50 60  LDLCALC 99 52  TRIG 71 74  CHOLHDL 3.3 2.1   TSH: Recent Labs    03/03/20 0000  TSH 0.93   A1C: Lab Results  Component Value Date   HGBA1C 7.0 03/03/2020     Assessment/Plan 1. Moderate asthma without complication, unspecified whether persistent -stable on current regimen, no recent flares.   2. Bilateral lower extremity edema Well controlled at this time.  3. CKD (chronic kidney disease), stage V ([place]) Continues on HD  4. Hyperlipidemia with target LDL less than 100 -LDL at goal on current regimen. Continue dietary regimen.   5. Chronic combined  systolic and diastolic CHF (congestive heart failure) (HCC) Stable, continues on coreg and lasix  6.Toe pain. Suspect gout to right toe will get uric acid level however left toe appears to have ingrown toe nail. Will have him do warm epsom salt soaks TID, if this does not improve/resolve symptoms in a week to notify, will need podiatry referral.  - Uric Acid  7. DM type 2, uncontrolled, with renal complications ([place]) -continues to work on diet, A1c at goal. Will continue current regimen and dietary compliance, routine foot care/monitoring and to keep up with diabetic eye exams through ophthalmology   8. PAD (peripheral artery disease) (HCC) Stable, continues on asa  9. 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  Carlos American. [place], [facility] [phone]

## 2020-04-07 NOTE — Assessment & Plan Note (Signed)
I also recommend proceeding with cataract traction with intraocular lens placement in the left eye so as to prepare for planned vitrectomy left eye which will allow for complete removal hemorrhage but more importantly anterior hyaloid preventing long-term complications.

## 2020-04-07 NOTE — Assessment & Plan Note (Signed)
Resolved post vitrectomy 

## 2020-04-07 NOTE — Assessment & Plan Note (Signed)
Nuclear sclerotic cataract OD accounts for acuity, and this post vitrectomized eye, central nuclear sclerosis is progressing and I do recommend proceeding with cataract traction with intraocular lens placed on the right eye to visually rehabilitate the right eye over this will allow ongoing monitoring for ongoing CSME OD

## 2020-04-07 NOTE — Progress Notes (Signed)
04/07/2020     CHIEF COMPLAINT Patient presents for Retina Follow Up (WIP FU OU, VIT HEMORRHAGE OS////Pt reports "hair-like", dark floaters that started Tuesday night (04/05/20) and have gotten worse OS. Pt reports big floaters, but also what looks like "spatter" when just looking through OS. Pt denies any pain or pressure, flashes of light.////Last A1C: 7.0 taken 02/2020                                Last BS: 115 this AM  )   HISTORY OF PRESENT ILLNESS: Henry Carroll is a 70 y.o. male who presents to the clinic today for:   HPI    Retina Follow Up    Patient presents with  Diabetic Retinopathy.  In left eye.  This started 6 months ago.  Duration of 6 months. Additional comments: WIP FU OU, VIT HEMORRHAGE OS    Pt reports "hair-like", dark floaters that started Tuesday night (04/05/20) and have gotten worse OS. Pt reports big floaters, but also what looks like "spatter" when just looking through OS. Pt denies any pain or pressure, flashes of light.    Last A1C: 7.0 taken 02/2020                                Last BS: 115 this AM         Last edited by Nichola Sizer D on 04/07/2020 10:19 AM. (History)      Referring physician: [name], NP [place],  [place] [address]  HISTORICAL INFORMATION:   Selected notes from the MEDICAL RECORD NUMBER    Lab Results  Component Value Date   HGBA1C 7.0 03/03/2020     CURRENT MEDICATIONS: No current outpatient medications on file. (Ophthalmic Drugs)   No current facility-administered medications for this visit. (Ophthalmic Drugs)   Current Outpatient Medications (Other)  Medication Sig  . acetaminophen (TYLENOL) 500 MG tablet Take 1,000 mg by mouth 2 (two) times daily as needed for moderate pain.  Marland Kitchen ADVAIR DISKUS 100-50 MCG/DOSE AEPB SMARTSIG:1 Puff(s) Via Inhaler Twice Daily PRN  . amLODipine (NORVASC) 10 MG tablet Take 1 tablet by mouth once daily  . aspirin EC 81 MG tablet Take 1 tablet (81 mg  total) by mouth daily.  Marland Kitchen atorvastatin (LIPITOR) 40 MG tablet TAKE 1 TABLET BY MOUTH ONCE DAILY IN THE EVENING  . carvedilol (COREG) 12.5 MG tablet Take 12.5 mg by mouth daily.   . Cholecalciferol (VITAMIN D3) 25 MCG (1000 UT) CAPS Take 2 capsules by mouth daily.  . Cinnamon 500 MG capsule Take 1,000 mg by mouth daily.   . Coenzyme Q10 10 MG capsule Take 10 mg by mouth daily.  Marland Kitchen epoetin alfa-epbx (RETACRIT) 53614 UNIT/ML injection 20,000 Units every 14 (fourteen) days.   Marland Kitchen ezetimibe (ZETIA) 10 MG tablet Take 1 tablet (10 mg total) by mouth daily.  . fluticasone (FLONASE) 50 MCG/ACT nasal spray Place 2 sprays into both nostrils daily as needed for allergies or rhinitis (SEASONAL ALLERGIES).  . furosemide (LASIX) 40 MG tablet Take 40 mg by mouth. On S-M-W-F Takes  1 tablet by mouth once d  . glucose blood (ONE TOUCH ULTRA TEST) test strip Use to test blood sugar three times daily. Dx: E11.21  . insulin detemir (LEVEMIR) 100 UNIT/ML injection Inject 0.18 mLs (18 Units total) into the skin daily.  In the morning  . isosorbide mononitrate (IMDUR) 30 MG 24 hr tablet TAKE 1 TABLET BY MOUTH ONCE DAILY -- PATIENT NEEDS APPOINTMENT FOR FUTURE REFILLS  . montelukast (SINGULAIR) 10 MG tablet TAKE 1 TABLET BY MOUTH AT BEDTIME  . Multiple Vitamin (MULTIVITAMIN WITH MINERALS) TABS tablet Take 1 tablet by mouth daily.  . nitroGLYCERIN (NITROSTAT) 0.4 MG SL tablet Place 1 tablet (0.4 mg total) under the tongue every 5 (five) minutes as needed for chest pain.  Marland Kitchen PROAIR HFA 108 (90 Base) MCG/ACT inhaler INHALE 2 PUFFS BY MOUTH EVERY 6 HOURS AS NEEDED FOR SHORTNESS OF BREATH  . sevelamer carbonate (RENVELA) 800 MG tablet Take 800 mg by mouth 3 (three) times daily with meals.   No current facility-administered medications for this visit. (Other)   Facility-Administered Medications Ordered in Other Visits (Other)  Medication Route  . regadenoson (LEXISCAN) injection SOLN 0.4 mg Intravenous      REVIEW OF  SYSTEMS:    ALLERGIES No Known Allergies  PAST MEDICAL HISTORY Past Medical History:  Diagnosis Date  . Abnormal stress test 02/10/2015  . Acute on chronic systolic and diastolic heart failure, NYHA class 1 ([place]) 12/31/2014  . Anemia   . Anemia in chronic kidney disease 09/03/2012  . 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 Asthma   . Asthma, chronic 06/04/2012  . Asthma, chronic, mild intermittent, with acute exacerbation 12/22/2015  . Bilateral lower extremity edema 12/22/2015  . CAD (coronary artery disease) 02/18/2015  . Chronic combined systolic (congestive) and diastolic (congestive) heart failure ([place])    a. 12/31/14: 2D ECHO: EF 40-45%, HK of inf myocardium, G1DD, mod MR  . Chronic combined systolic and diastolic CHF (congestive heart failure) ([place]) 02/10/2015  . Chronic systolic heart failure ([place]) 02/25/2015  . CKD (chronic kidney disease), stage III (HCC)    stage 3 kidney disease  . CKD (chronic kidney disease), stage V ([place]) 08/30/2018  . COLONIC POLYPS, HX OF 04/13/2009   Qualifier: Diagnosis of  By: Westly Pam.   . Coronary artery disease    a. LHC 01/2015 - triple vessel CAD (mod oLM, mLAD, severe mRCA, intermediate branch stenosis, CTO of mCx). Plan CABG 02/2015.  Marland Kitchen Demand ischemia ([place]) 12/31/2014  . DM type 2, uncontrolled, with renal complications ([place]) 1/61/0960  . Dyspnea     due to fluid  . Elevated troponin   . Essential hypertension 10/09/2013  . Folliculitis of perineum 10/01/2012  . Gastric erosion   . GERD (gastroesophageal reflux disease)   . History of blood transfusion   . Hyperlipidemia   . Hyperlipidemia with target LDL less than 100 12/31/2014  . Hypertension   . Hypertensive heart disease with heart failure ([place]) 02/25/2015  . IDA (iron deficiency anemia) 01/23/2016  . Melena 01/23/2016  . 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 Mitral regurgitation    a. Mild-mod by echo 12/2014.  . Morbid obesity ([place]) 12/29/2014   . Myocardial infarction [facility])    pt. states per [name] he has in the past  . NSVT (nonsustained ventricular tachycardia) ([place])    a. 9 beats during 01/2015 adm. BB titrated.  . Obesity    a. BMI 33  . Obesity (BMI 30-39.9) 05/05/2013  . OSA (obstructive sleep apnea) 01/16/2018   Mild obstructive sleep apnea overall with an AHI of 7.3/h and no significant central sleep apnea. Severe obstructive sleep apnea during REM sleep with an AHI of 34.3/h.  Now on  CPAP at 6 cm H2O.   . Other malaise and fatigue 09/03/2012  . 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 PAD (peripheral artery disease) ([place]) 03/02/2015  . Pneumonia   . Sleep apnea    2019, Dr.Turner diagnoised   . Small bowel lesion   . Symptomatic anemia 08/29/2018  . 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 Type II diabetes mellitus ([place])   . Vitreous hemorrhage of right eye ([place]) 05/26/2019  . Wears dentures    Past Surgical History:  Procedure Laterality Date  . BACK SURGERY    . BASCILIC VEIN TRANSPOSITION Left 11/14/2018   Procedure: FIRST STAGE BASILIC VEIN TRANSPOSITION LEFT ARM;  Surgeon: Serafina Mitchell, MD;  Location: [facility];  Service: Vascular;  Laterality: Left;  . BASCILIC VEIN TRANSPOSITION Left 01/28/2019   Procedure: BASILIC VEIN TRANSPOSITION SECOND STAGE;  Surgeon: Serafina Mitchell, MD;  Location: [facility];  Service: Vascular;  Laterality: Left;  . BONE MARROW BIOPSY    . CARDIAC CATHETERIZATION N/A 02/09/2015   Procedure: Left Heart Cath and Coronary Angiography;  Surgeon: Burnell Blanks, MD;  Location: [facility];  Service: Cardiovascular;  Laterality: N/A;  . COLONOSCOPY  2011   Dr. [name]: Ileocecal valve appeared normal, scattered pancolonic diverticulosis, difficult bowel prep making smaller lesions potentially missed. Recommended three-year follow-up colonoscopy.  . COLONOSCOPY  2003   Dr. [name]: Suspicious lesion at the ileocecal valve, multiple biopsies benign, pancolonic diverticulosis.   . COLONOSCOPY N/A 02/15/2016   diverticulosis in sigmoid and descending colon, single 5 mm polyp at splenic flexure (Tubular adenoma)  . CORONARY ARTERY BYPASS GRAFT N/A 02/18/2015   Procedure: CORONARY ARTERY BYPASS GRAFTING (CABG) x  four, using bilateral internal mammary arteries and right leg greater saphenous vein harvested endoscopically;  Surgeon: [name], MD;  Location: [facility];  Service: Open Heart Surgery;  Laterality: N/A;  . ESOPHAGOGASTRODUODENOSCOPY N/A 02/15/2016   normal  . EYE SURGERY Right    July 2019   . GIVENS CAPSULE STUDY N/A 01/08/2017   couple of gastric and small bowel eroions in setting of aspirin 81 mg daily but nothing concerning, continue Hematology follow-up  . HERNIA REPAIR  4580   Umbilical  . LUMBAR [place] SURGERY  2006   L4 & L5  . REFRACTIVE SURGERY Left    2019   . REFRACTIVE SURGERY Right    2019  . TEE WITHOUT CARDIOVERSION N/A 02/18/2015   Procedure: TRANSESOPHAGEAL ECHOCARDIOGRAM (TEE);  Surgeon: [name], MD;  Location: [facility];  Service: Open Heart Surgery;  Laterality: N/A;  . TONSILLECTOMY  1962    FAMILY HISTORY Family History  Problem Relation Age of Onset  . Diabetes Mother   . Heart disease Mother        later in life, age >53  . Heart disease Father        heart failure later in life  . Hypertension Father   . Stroke Sister   . Kidney disease Daughter   . Sudden death Daughter   . Colon cancer Paternal Uncle        Passed from colon CA  . Heart attack Neg Hx     SOCIAL HISTORY Social History   Tobacco Use  . Smoking status: Former Smoker    Years: 17.00    Types: Cigars    Quit date: 12/31/2014    Years since quitting: 5.2  . Smokeless tobacco: Never Used  Vaping Use  . Vaping Use: Never used  Substance Use  Topics  . Alcohol use: Yes    Alcohol/week: 3.0 standard drinks    Types: 3 Glasses of wine per week    Comment: occasional glass of wine.  . Drug use: No         OPHTHALMIC EXAM:  Base Eye Exam     Visual Acuity (ETDRS)      Right Left   Dist [place] 20/200 20/100 -2   Dist ph [place] 20/60 -2 NI       Tonometry (Tonopen, 10:24 AM)      Right Left   Pressure 12 18       Pupils      Pupils Dark Light Shape React APD   Right PERRL 4 3 Round Brisk None   Left PERRL 4 3 Round Brisk None       Visual Fields (Counting fingers)      Left Right    Full Full       Neuro/Psych    Oriented x3: Yes   Mood/Affect: Normal       Dilation    Both eyes: 1.0% Mydriacyl, 2.5% Phenylephrine @ 10:24 AM        Slit Lamp and Fundus Exam    External Exam      Right Left   External Normal Normal       Slit Lamp Exam      Right Left   Lids/Lashes Normal Normal   Conjunctiva/Sclera White and quiet White and quiet   Cornea Clear Clear   Anterior Chamber Deep and quiet Deep and quiet   Iris Round and reactive Round and reactive   Lens 3+ Nuclear sclerosis 3+ Nuclear sclerosis   Anterior Vitreous Normal Normal       Fundus Exam      Right Left   Posterior Vitreous Clear, avitric Vitreous hemorrhage 3+   Disc Normal hazy view   C/D Ratio 0.25 0.4   Macula Macular thickening,  Mild clinically significant macular edema, Exudates, Microaneurysms Microaneurysms, Clinically significant macular edema, Mild clinically significant macular edema   Vessels Quiet proliferative diabetic retinopathy Active proliferative diabetic retinopathy   Periphery Laser scars, good PRP room for PRP INF          IMAGING AND PROCEDURES  Imaging and Procedures for 04/07/20  OCT, Retina - OU - Both Eyes       Right Eye Quality was good. Scan locations included subfoveal. Central Foveal Thickness: 361. Progression has worsened. Findings include abnormal foveal contour, cystoid macular edema.   Left Eye Quality was good. Central Foveal Thickness: 255. Progression has been stable.   Notes CSME OD active temporally.  Medial opacity OS yet no active CSME OS       Color Fundus Photography Optos - OU -  Both Eyes       Right Eye Progression has been stable. Disc findings include normal observations. Macula : microaneurysms.   Left Eye Progression has worsened. Disc findings include normal observations.   Notes OD, proliferative diabetic retinopathy completely quiescent good PRP 360 clear media  OS,  moderate to dense vitreous hemorrhage, centrally, room inferiorly for PRP OS Will need completion of PRP but most importantly will need to schedule vitrectomy.       Panretinal Photocoagulation - OS - Left Eye       Time Out Confirmed correct patient, procedure, site, and patient consented.   Anesthesia Topical anesthesia was used. Anesthetic medications included Proparacaine 0.5%.   Laser Information The type of laser was  diode. Color was yellow. The duration in seconds was 0.05. The spot size was 390 microns. Laser power was 420. Total spots was 895.   Post-op The patient tolerated the procedure well. There were no complications. The patient received written and verbal post procedure care education.                 ASSESSMENT/PLAN:  Nuclear sclerotic cataract of right eye Nuclear sclerotic cataract OD accounts for acuity, and this post vitrectomized eye, central nuclear sclerosis is progressing and I do recommend proceeding with cataract traction with intraocular lens placed on the right eye to visually rehabilitate the right eye over this will allow ongoing monitoring for ongoing CSME OD    Nuclear sclerotic cataract of left eye I also recommend proceeding with cataract traction with intraocular lens placement in the left eye so as to prepare for planned vitrectomy left eye which will allow for complete removal hemorrhage but more importantly anterior hyaloid preventing long-term complications.  Vitreous hemorrhage of right eye ([place]) Resolved post vitrectomy  Vitreous hemorrhage of left eye ([place]) New onset, significant opacification will likely need vitrectomy  yet needed moderate nuclear sclerotic cataract moved via performance of cataract extraction with intraocular lens placement under the direction of Dr. Gershon Crane      ICD-10-CM   1. 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 Diabetic macular edema of right eye with proliferative retinopathy associated with type 2 diabetes mellitus (HCC)  E11.3511 OCT, Retina - OU - Both Eyes    Color Fundus Photography Optos - OU - Both Eyes  3. Nuclear sclerotic cataract of right eye  H25.11   4. Nuclear sclerotic cataract of left eye  H25.12   5. Vitreous hemorrhage of right eye ([place])  H43.11   6. Vitreous hemorrhage of left eye (HCC)  H43.12     1.  Vitreous hemorrhage the left eye temporal acuity, indicates active PDR present OS there is rim inferiorly and nasally and temporally for PRP will deliver PRP today decrease neovascular stimulus will ultimately need vitrectomy left eye  2.  Moderately dense cataract in left eye will progress soon after vitrectomy if vitrectomy undertaken first in the left eye.  Therefore recommended urgent cataract evaluation with Dr. Rutherford Guys consideration of cataract extraction intraocular displacement left eye so as to prevent vitrectomy in the left eye and endolaser in order to induce quiescent PDR , for best possible long-term stability  3.  Once vitrectomy left eye is completed, may proceed then with uneventful CE IOL OD to match up pseudophakia OU  Ophthalmic Meds Ordered this visit:  No orders of the defined types were placed in this encounter.      Return After cataract surgery, follow-up 3 weeks Dr. Zadie Rhine dilate OS, for Schedule preop cataract extraction left eye Dr. Gershon Crane promptly.  There are no Patient Instructions on file for this  visit.   Explained the diagnoses, plan, and follow up with the patient and they expressed understanding.  Patient expressed understanding of the importance of proper follow up care.   Clent Demark Yuval Nolet M.D. Diseases & Surgery of the Retina and Vitreous Retina & Diabetic [place] 04/07/20     Abbreviations: M myopia (nearsighted); A astigmatism; H hyperopia (farsighted); P presbyopia; Mrx  spectacle prescription;  CTL contact lenses; OD right eye; OS left eye; OU both eyes  XT exotropia; ET esotropia; PEK punctate epithelial keratitis; PEE punctate epithelial erosions; DES dry eye syndrome; MGD meibomian gland dysfunction; ATs artificial tears; PFAT's preservative free artificial tears; [place]pert nuclear sclerotic cataract; PSC posterior subcapsular cataract; ERM epi-retinal membrane; PVD posterior vitreous detachment; RD retinal detachment; DM diabetes mellitus; DR diabetic retinopathy; NPDR non-proliferative diabetic retinopathy; PDR proliferative diabetic retinopathy; CSME clinically significant macular edema; DME diabetic macular edema; dbh dot blot hemorrhages; CWS cotton wool spot; POAG primary open angle glaucoma; C/D cup-to-disc ratio; HVF humphrey visual field; GVF goldmann visual field; OCT optical coherence tomography; IOP intraocular pressure; BRVO Branch retinal vein occlusion; CRVO central retinal vein occlusion; CRAO central retinal artery occlusion; BRAO branch retinal artery occlusion; RT retinal tear; SB scleral buckle; PPV pars plana vitrectomy; VH Vitreous hemorrhage; PRP panretinal laser photocoagulation; IVK intravitreal kenalog; VMT vitreomacular traction; MH Macular hole;  NVD neovascularization of the disc; NVE neovascularization elsewhere; AREDS age related eye disease study; ARMD age related macular degeneration; POAG primary open angle glaucoma; EBMD epithelial/anterior basement membrane dystrophy; ACIOL anterior chamber intraocular lens; IOL intraocular lens; PCIOL posterior chamber  intraocular lens; Phaco/IOL phacoemulsification with intraocular lens placement; [place] photorefractive keratectomy; LASIK laser assisted in situ keratomileusis; HTN hypertension; DM diabetes mellitus; COPD chronic obstructive pulmonary disease

## 2020-04-08 DIAGNOSIS — D631 Anemia in chronic kidney disease: Secondary | ICD-10-CM | POA: Diagnosis not present

## 2020-04-08 DIAGNOSIS — E1129 Type 2 diabetes mellitus with other diabetic kidney complication: Secondary | ICD-10-CM | POA: Diagnosis not present

## 2020-04-08 DIAGNOSIS — Z992 Dependence on renal dialysis: Secondary | ICD-10-CM | POA: Diagnosis not present

## 2020-04-08 DIAGNOSIS — N186 End stage renal disease: Secondary | ICD-10-CM | POA: Diagnosis not present

## 2020-04-08 DIAGNOSIS — N2581 Secondary hyperparathyroidism of renal origin: Secondary | ICD-10-CM | POA: Diagnosis not present

## 2020-04-08 DIAGNOSIS — D472 Monoclonal gammopathy: Secondary | ICD-10-CM | POA: Diagnosis not present

## 2020-04-08 LAB — PROTEIN ELECTROPHORESIS, SERUM
A/G Ratio: 1.3 (ref 0.7–1.7)
Albumin ELP: 4.2 g/dL (ref 2.9–4.4)
Alpha-1-Globulin: 0.2 g/dL (ref 0.0–0.4)
Alpha-2-Globulin: 0.9 g/dL (ref 0.4–1.0)
Beta Globulin: 1 g/dL (ref 0.7–1.3)
Gamma Globulin: 1.1 g/dL (ref 0.4–1.8)
Globulin, Total: 3.2 g/dL (ref 2.2–3.9)
M-Spike, %: 0.5 g/dL — ABNORMAL HIGH
Total Protein ELP: 7.4 g/dL (ref 6.0–8.5)

## 2020-04-09 DIAGNOSIS — Z992 Dependence on renal dialysis: Secondary | ICD-10-CM | POA: Diagnosis not present

## 2020-04-09 DIAGNOSIS — D631 Anemia in chronic kidney disease: Secondary | ICD-10-CM | POA: Diagnosis not present

## 2020-04-09 DIAGNOSIS — E1129 Type 2 diabetes mellitus with other diabetic kidney complication: Secondary | ICD-10-CM | POA: Diagnosis not present

## 2020-04-09 DIAGNOSIS — N186 End stage renal disease: Secondary | ICD-10-CM | POA: Diagnosis not present

## 2020-04-09 DIAGNOSIS — D472 Monoclonal gammopathy: Secondary | ICD-10-CM | POA: Diagnosis not present

## 2020-04-09 DIAGNOSIS — N2581 Secondary hyperparathyroidism of renal origin: Secondary | ICD-10-CM | POA: Diagnosis not present

## 2020-04-11 DIAGNOSIS — Z992 Dependence on renal dialysis: Secondary | ICD-10-CM | POA: Diagnosis not present

## 2020-04-11 DIAGNOSIS — I517 Cardiomegaly: Secondary | ICD-10-CM | POA: Diagnosis not present

## 2020-04-11 DIAGNOSIS — Z01810 Encounter for preprocedural cardiovascular examination: Secondary | ICD-10-CM | POA: Diagnosis not present

## 2020-04-11 DIAGNOSIS — N186 End stage renal disease: Secondary | ICD-10-CM | POA: Diagnosis not present

## 2020-04-12 DIAGNOSIS — R0600 Dyspnea, unspecified: Secondary | ICD-10-CM | POA: Diagnosis not present

## 2020-04-12 DIAGNOSIS — R936 Abnormal findings on diagnostic imaging of limbs: Secondary | ICD-10-CM | POA: Diagnosis not present

## 2020-04-12 DIAGNOSIS — Z992 Dependence on renal dialysis: Secondary | ICD-10-CM | POA: Diagnosis not present

## 2020-04-12 DIAGNOSIS — E1122 Type 2 diabetes mellitus with diabetic chronic kidney disease: Secondary | ICD-10-CM | POA: Diagnosis not present

## 2020-04-12 DIAGNOSIS — N186 End stage renal disease: Secondary | ICD-10-CM | POA: Diagnosis not present

## 2020-04-12 DIAGNOSIS — I739 Peripheral vascular disease, unspecified: Secondary | ICD-10-CM | POA: Diagnosis not present

## 2020-04-12 DIAGNOSIS — I6523 Occlusion and stenosis of bilateral carotid arteries: Secondary | ICD-10-CM | POA: Diagnosis not present

## 2020-04-13 ENCOUNTER — Inpatient Hospital Stay (HOSPITAL_COMMUNITY): Payer: Medicare Other | Attending: Hematology | Admitting: Hematology

## 2020-04-13 ENCOUNTER — Other Ambulatory Visit: Payer: Self-pay

## 2020-04-13 VITALS — BP 118/66 | HR 93 | Temp 96.9°F | Resp 16 | Wt 201.6 lb

## 2020-04-13 DIAGNOSIS — Z87891 Personal history of nicotine dependence: Secondary | ICD-10-CM | POA: Diagnosis not present

## 2020-04-13 DIAGNOSIS — D472 Monoclonal gammopathy: Secondary | ICD-10-CM | POA: Insufficient documentation

## 2020-04-13 DIAGNOSIS — Z992 Dependence on renal dialysis: Secondary | ICD-10-CM | POA: Insufficient documentation

## 2020-04-13 DIAGNOSIS — D631 Anemia in chronic kidney disease: Secondary | ICD-10-CM | POA: Diagnosis not present

## 2020-04-13 DIAGNOSIS — N186 End stage renal disease: Secondary | ICD-10-CM | POA: Diagnosis not present

## 2020-04-13 DIAGNOSIS — N2581 Secondary hyperparathyroidism of renal origin: Secondary | ICD-10-CM | POA: Diagnosis not present

## 2020-04-13 DIAGNOSIS — N185 Chronic kidney disease, stage 5: Secondary | ICD-10-CM | POA: Insufficient documentation

## 2020-04-13 DIAGNOSIS — D509 Iron deficiency anemia, unspecified: Secondary | ICD-10-CM | POA: Diagnosis not present

## 2020-04-13 DIAGNOSIS — E1129 Type 2 diabetes mellitus with other diabetic kidney complication: Secondary | ICD-10-CM | POA: Diagnosis not present

## 2020-04-13 NOTE — Progress Notes (Signed)
Henry Carroll, Henry Carroll [id number]   CLINIC:  Medical Oncology/Hematology  PCP:  [name], NP [place] / [place] [address]  [phone]  REASON FOR VISIT:  Follow-up for IgG lambda MGUS and IDA  PRIOR THERAPY: None  CURRENT THERAPY: Intermittent Retacrit and Feraheme last on 06/30/2019  INTERVAL HISTORY:  [name], a 70 y.o. male, returns for routine follow-up for his IgG lambda MGUS and IDA. Henry Carroll was last seen on 10/12/2019.  Today he reports feeling well. He denies having any new bone or back pain, recent infections, F/C or night sweats. His appetite is excellent and his energy levels are good. He denies having any leg swelling. He is going to HD 3 days per week.   REVIEW OF SYSTEMS:  Review of Systems  Constitutional: Positive for fatigue (75%). Negative for appetite change, chills, diaphoresis and fever.  Cardiovascular: Negative for leg swelling.  Musculoskeletal: Negative for arthralgias and back pain.  All other systems reviewed and are negative.   PAST MEDICAL/SURGICAL HISTORY:  Past Medical History:  Diagnosis Date  . Abnormal stress test 02/10/2015  . Acute on chronic systolic and diastolic heart failure, NYHA class 1 ([place]) 12/31/2014  . Anemia   . Anemia in chronic kidney disease 09/03/2012  . 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 Asthma   . Asthma, chronic 06/04/2012  . Asthma, chronic, mild intermittent, with acute exacerbation 12/22/2015  . Bilateral lower extremity edema 12/22/2015  . CAD (coronary artery disease) 02/18/2015  . Chronic combined systolic (congestive) and diastolic (congestive) heart failure ([place])    a. 12/31/14: 2D ECHO: EF 40-45%, HK of inf myocardium, G1DD, mod MR  . Chronic combined systolic and diastolic CHF (congestive heart failure) ([place]) 02/10/2015  . Chronic systolic heart failure ([place]) 02/25/2015  . CKD (chronic kidney disease), stage III (HCC)    stage 3 kidney  disease  . CKD (chronic kidney disease), stage V ([place]) 08/30/2018  . COLONIC POLYPS, HX OF 04/13/2009   Qualifier: Diagnosis of  By: Westly Pam.   . Coronary artery disease    a. LHC 01/2015 - triple vessel CAD (mod oLM, mLAD, severe mRCA, intermediate branch stenosis, CTO of mCx). Plan CABG 02/2015.  Marland Kitchen Demand ischemia ([place]) 12/31/2014  . DM type 2, uncontrolled, with renal complications ([place]) 9/38/1829  . Dyspnea     due to fluid  . Elevated troponin   . Essential hypertension 10/09/2013  . Folliculitis of perineum 10/01/2012  . Gastric erosion   . GERD (gastroesophageal reflux disease)   . History of blood transfusion   . Hyperlipidemia   . Hyperlipidemia with target LDL less than 100 12/31/2014  . Hypertension   . Hypertensive heart disease with heart failure ([place]) 02/25/2015  . IDA (iron deficiency anemia) 01/23/2016  . Melena 01/23/2016  . 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 Mitral regurgitation    a. Mild-mod by echo 12/2014.  . Morbid obesity ([place]) 12/29/2014  . Myocardial infarction [facility]    pt. states per [name] he has in the past  . NSVT (nonsustained ventricular tachycardia) ([place])    a. 9 beats during 01/2015 adm. BB titrated.  . Obesity    a. BMI 33  . Obesity (BMI 30-39.9) 05/05/2013  . OSA (obstructive sleep apnea) 01/16/2018   Mild obstructive sleep apnea overall with an AHI of 7.3/h and no significant central sleep apnea. Severe obstructive sleep  apnea during REM sleep with an AHI of 34.3/h.  Now on CPAP at 6 cm H2O.   . Other malaise and fatigue 09/03/2012  . 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 PAD (peripheral artery disease) ([place]) 03/02/2015  . Pneumonia   . Sleep apnea    2019, Dr.Turner diagnoised   . Small bowel lesion   . Symptomatic anemia 08/29/2018  . 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 Type II diabetes mellitus ([place])   . Vitreous hemorrhage of right eye ([place]) 05/26/2019  . Wears dentures    Past  Surgical History:  Procedure Laterality Date  . BACK SURGERY    . BASCILIC VEIN TRANSPOSITION Left 11/14/2018   Procedure: FIRST STAGE BASILIC VEIN TRANSPOSITION LEFT ARM;  Surgeon: Serafina Mitchell, MD;  Location: [facility];  Service: Vascular;  Laterality: Left;  . BASCILIC VEIN TRANSPOSITION Left 01/28/2019   Procedure: BASILIC VEIN TRANSPOSITION SECOND STAGE;  Surgeon: Serafina Mitchell, MD;  Location: [facility];  Service: Vascular;  Laterality: Left;  . BONE MARROW BIOPSY    . CARDIAC CATHETERIZATION N/A 02/09/2015   Procedure: Left Heart Cath and Coronary Angiography;  Surgeon: Burnell Blanks, MD;  Location: [facility];  Service: Cardiovascular;  Laterality: N/A;  . COLONOSCOPY  2011   Dr. [name]: Ileocecal valve appeared normal, scattered pancolonic diverticulosis, difficult bowel prep making smaller lesions potentially missed. Recommended three-year follow-up colonoscopy.  . COLONOSCOPY  2003   Dr. [name]: Suspicious lesion at the ileocecal valve, multiple biopsies benign, pancolonic diverticulosis.  . COLONOSCOPY N/A 02/15/2016   diverticulosis in sigmoid and descending colon, single 5 mm polyp at splenic flexure (Tubular adenoma)  . CORONARY ARTERY BYPASS GRAFT N/A 02/18/2015   Procedure: CORONARY ARTERY BYPASS GRAFTING (CABG) x  four, using bilateral internal mammary arteries and right leg greater saphenous vein harvested endoscopically;  Surgeon: [name], MD;  Location: [facility];  Service: Open Heart Surgery;  Laterality: N/A;  . ESOPHAGOGASTRODUODENOSCOPY N/A 02/15/2016   normal  . EYE SURGERY Right    July 2019   . GIVENS CAPSULE STUDY N/A 01/08/2017   couple of gastric and small bowel eroions in setting of aspirin 81 mg daily but nothing concerning, continue Hematology follow-up  . HERNIA REPAIR  9323   Umbilical  . LUMBAR [place] SURGERY  2006   L4 & L5  . REFRACTIVE SURGERY Left    2019   . REFRACTIVE SURGERY Right    2019  . TEE WITHOUT CARDIOVERSION N/A 02/18/2015    Procedure: TRANSESOPHAGEAL ECHOCARDIOGRAM (TEE);  Surgeon: [name], MD;  Location: [facility];  Service: Open Heart Surgery;  Laterality: N/A;  . TONSILLECTOMY  1962    SOCIAL HISTORY:  Social History   Socioeconomic History  . Marital status: Married    Spouse name: Not on file  . Number of children: Not on file  . Years of education: Not on file  . Highest education level: Not on file  Occupational History  . Not on file  Tobacco Use  . Smoking status: Former Smoker    Years: 17.00    Types: Cigars    Quit date: 12/31/2014    Years since quitting: 5.2  . Smokeless tobacco: Never Used  Vaping Use  . Vaping Use: Never used  Substance and Sexual Activity  . Alcohol use: Yes    Alcohol/week: 3.0 standard drinks    Types: 3 Glasses of wine per week    Comment: occasional glass of wine.  . Drug use:  No  . Sexual activity: Yes    Birth control/protection: None  Other Topics Concern  . Not on file  Social History Narrative  . Not on file   Social Determinants of Health   Financial Resource Strain: Not on file  Food Insecurity: Not on file  Transportation Needs: Not on file  Physical Activity: Not on file  Stress: Not on file  Social Connections: Not on file  Intimate Partner Violence: Not on file    FAMILY HISTORY:  Family History  Problem Relation Age of Onset  . Diabetes Mother   . Heart disease Mother        later in life, age >60  . Heart disease Father        heart failure later in life  . Hypertension Father   . Stroke Sister   . Kidney disease Daughter   . Sudden death Daughter   . Colon cancer Paternal Uncle        Passed from colon CA  . Heart attack Neg Hx     CURRENT MEDICATIONS:  Current Outpatient Medications  Medication Sig Dispense Refill  . ADVAIR DISKUS 100-50 MCG/DOSE AEPB SMARTSIG:1 Puff(s) Via Inhaler Twice Daily PRN    . amLODipine (NORVASC) 10 MG tablet Take 1 tablet by mouth once daily 90 tablet 1  . aspirin EC 81 MG tablet Take  1 tablet (81 mg total) by mouth daily. 90 tablet 3  . atorvastatin (LIPITOR) 40 MG tablet TAKE 1 TABLET BY MOUTH ONCE DAILY IN THE EVENING 90 tablet 1  . carvedilol (COREG) 12.5 MG tablet Take 12.5 mg by mouth daily.     . Cholecalciferol (VITAMIN D3) 25 MCG (1000 UT) CAPS Take 2 capsules by mouth daily.    . Cinnamon 500 MG capsule Take 1,000 mg by mouth daily.     Marland Kitchen CINNAMON PO Take by mouth.    . Coenzyme Q10 10 MG capsule Take 10 mg by mouth daily.    . Coenzyme Q10 10 MG capsule Take by mouth.    Marland Kitchen epoetin alfa-epbx (RETACRIT) 94503 UNIT/ML injection 20,000 Units every 14 (fourteen) days.     Marland Kitchen ezetimibe (ZETIA) 10 MG tablet Take by mouth.    . fluticasone (FLONASE) 50 MCG/ACT nasal spray Place 2 sprays into both nostrils daily as needed for allergies or rhinitis (SEASONAL ALLERGIES).    . furosemide (LASIX) 40 MG tablet Take 40 mg by mouth. On S-M-W-F Takes  1 tablet by mouth once d    . glucose blood (ONE TOUCH ULTRA TEST) test strip Use to test blood sugar three times daily. Dx: E11.21 900 each 1  . insulin detemir (LEVEMIR) 100 UNIT/ML injection Inject 0.18 mLs (18 Units total) into the skin daily. In the morning 10 mL 3  . isosorbide dinitrate (ISORDIL) 10 MG tablet Take by mouth.    . isosorbide mononitrate (IMDUR) 30 MG 24 hr tablet TAKE 1 TABLET BY MOUTH ONCE DAILY -- PATIENT NEEDS APPOINTMENT FOR FUTURE REFILLS 90 tablet 3  . Methoxy PEG-Epoetin Beta (MIRCERA IJ) Mircera    . montelukast (SINGULAIR) 10 MG tablet TAKE 1 TABLET BY MOUTH AT BEDTIME 90 tablet 1  . Multiple Vitamin (MULTIVITAMIN WITH MINERALS) TABS tablet Take 1 tablet by mouth daily.    . nitroGLYCERIN (NITROSTAT) 0.4 MG SL tablet Place 1 tablet (0.4 mg total) under the tongue every 5 (five) minutes as needed for chest pain. 25 tablet 3  . PROAIR HFA 108 (90 Base) MCG/ACT inhaler INHALE 2  PUFFS BY MOUTH EVERY 6 HOURS AS NEEDED FOR SHORTNESS OF BREATH 18 g 0  . sevelamer carbonate (RENVELA) 800 MG tablet Take 800 mg by  mouth 3 (three) times daily with meals.    . vitamin E 45 MG (100 UNITS) capsule Take by mouth.    Marland Kitchen acetaminophen (TYLENOL) 500 MG tablet Take 1,000 mg by mouth 2 (two) times daily as needed for moderate pain. (Patient not taking: Reported on 04/13/2020)    . ezetimibe (ZETIA) 10 MG tablet Take 1 tablet (10 mg total) by mouth daily. 90 tablet 3   No current facility-administered medications for this visit.   Facility-Administered Medications Ordered in Other Visits  Medication Dose Route Frequency Provider Last Rate Last Admin  . regadenoson (LEXISCAN) injection SOLN 0.4 mg  0.4 mg Intravenous Once Jerline Pain, MD        ALLERGIES:  No Known Allergies  PHYSICAL EXAM:  Performance status (ECOG): 1 - Symptomatic but completely ambulatory  Vitals:   04/13/20 1123  BP: 118/66  Pulse: 93  Resp: 16  Temp: (!) 96.9 F (36.1 C)  SpO2: 98%   Wt Readings from Last 3 Encounters:  04/13/20 201 lb 9.6 oz (91.4 kg)  04/04/20 207 lb (93.9 kg)  01/15/20 204 lb (92.5 kg)   Physical Exam Vitals reviewed.  Constitutional:      Appearance: Normal appearance.  Cardiovascular:     Rate and Rhythm: Normal rate and regular rhythm.     Pulses: Normal pulses.     Heart sounds: Normal heart sounds.  Pulmonary:     Effort: Pulmonary effort is normal.     Breath sounds: Normal breath sounds.  Abdominal:     Palpations: Abdomen is soft. There is no hepatomegaly, splenomegaly or mass.     Tenderness: There is no abdominal tenderness.     Hernia: No hernia is present.  Musculoskeletal:     Right lower leg: No edema.     Left lower leg: No edema.  Neurological:     General: No focal deficit present.     Mental Status: He is alert and oriented to person, place, and time.  Psychiatric:        Mood and Affect: Mood normal.        Behavior: Behavior normal.     LABORATORY DATA:  I have reviewed the labs as listed.  CBC Latest Ref Rng & Units 04/06/2020 03/30/2020 03/28/2020  WBC 4.0 - 10.5  K/uL 5.2 6.6 -  Hemoglobin 13.0 - 17.0 g/dL 12.6(L) 12.2(A) 11.8(A)  Hematocrit 39.0 - 52.0 % 37.5(L) 36(A) -  Platelets 150 - 400 K/uL 192 274 -   CMP Latest Ref Rng & Units 04/06/2020 03/30/2020 03/28/2020  Glucose 70 - 99 mg/dL 128(H) - -  BUN 8 - 23 mg/dL 31(H) - 59(A)  Creatinine 0.61 - 1.24 mg/dL 5.43(H) 9.6(A) -  Sodium 135 - 145 mmol/L 139 139 -  Potassium 3.5 - 5.1 mmol/L 3.6 - -  Chloride 98 - 111 mmol/L 92(L) 94(A) -  CO2 22 - 32 mmol/L 32 - -  Calcium 8.9 - 10.3 mg/dL 9.6 9.6 -  Total Protein 6.5 - 8.1 g/dL 7.8 - -  Total Bilirubin 0.3 - 1.2 mg/dL 0.7 - -  Alkaline Phos 38 - 126 U/L 57 - -  AST 15 - 41 U/L 20 - -  ALT 0 - 44 U/L 14 - -      Component Value Date/Time   RBC 3.48 (L) 04/06/2020  1118   MCV 107.8 (H) 04/06/2020 1118   MCV 98 (H) 10/05/2019 0916   MCH 36.2 (H) 04/06/2020 1118   MCHC 33.6 04/06/2020 1118   RDW 14.0 04/06/2020 1118   RDW 12.6 10/05/2019 0916   LYMPHSABS 1.2 04/06/2020 1118   LYMPHSABS 1.2 10/05/2019 0916   MONOABS 0.7 04/06/2020 1118   EOSABS 0.1 04/06/2020 1118   EOSABS 0.3 10/05/2019 0916   BASOSABS 0.0 04/06/2020 1118   BASOSABS 0.0 10/05/2019 0916   Lab Results  Component Value Date   LDH 230 (H) 04/06/2020   LDH 195 (H) 10/05/2019   LDH 262 (H) 02/10/2019   Lab Results  Component Value Date   TOTALPROTELP 7.4 04/06/2020   ALBUMINELP 4.2 04/06/2020   A1GS 0.2 04/06/2020   A2GS 0.9 04/06/2020   BETS 1.0 04/06/2020   GAMS 1.1 04/06/2020   MSPIKE 0.5 (H) 04/06/2020   SPEI Comment 04/06/2020    Lab Results  Component Value Date   KPAFRELGTCHN 115.9 (H) 04/06/2020   LAMBDASER 177.9 (H) 04/06/2020   KAPLAMBRATIO 0.65 04/06/2020    DIAGNOSTIC IMAGING:  I have independently reviewed the scans and discussed with the patient. Panretinal Photocoagulation - OS - Left Eye  Result Date: 04/07/2020 Time Out Confirmed correct patient, procedure, site, and patient consented. Anesthesia Topical anesthesia was used. Anesthetic  medications included Proparacaine 0.5%. Laser Information The type of laser was diode. Color was yellow. The duration in seconds was 0.05. The spot size was 390 microns. Laser power was 420. Total spots was 895. Post-op The patient tolerated the procedure well. There were no complications. The patient received written and verbal post procedure care education.   OCT, Retina - OU - Both Eyes  Result Date: 04/07/2020 Right Eye Quality was good. Scan locations included subfoveal. Central Foveal Thickness: 361. Progression has worsened. Findings include abnormal foveal contour, cystoid macular edema. Left Eye Quality was good. Central Foveal Thickness: 255. Progression has been stable. Notes CSME OD active temporally. Medial opacity OS yet no active CSME OS  Color Fundus Photography Optos - OU - Both Eyes  Result Date: 04/07/2020 Right Eye Progression has been stable. Disc findings include normal observations. Macula : microaneurysms. Left Eye Progression has worsened. Disc findings include normal observations. Notes OD, proliferative diabetic retinopathy completely quiescent good PRP 360 clear media OS,  moderate to dense vitreous hemorrhage, centrally, room inferiorly for PRP OS Will need completion of PRP but most importantly will need to schedule vitrectomy.    ASSESSMENT:  1. IgG lambda MGUS: -BM BX on 09/29/2018 done secondary to worsening kidney function showed hypercellular marrow with 10% lambda restricted plasma cells. Chromosome analysis and FISH panel were normal. -PET scan on 10/13/2018 did not show any evidence of myeloma. -Kidney biopsy on 11/21/2018 showed severe diabetic nephropathy with nodular glomerulosclerosis, severe arteriolar hyalinosis, focal and segmental glomerular tuft scarring in association with severe arterial sclerosis and diffuse severe interstitial fibrosis and tubular atrophy. No evidence of monoclonal immunoglobulin deposition disease or an immune complex mediated  glomerulonephritis. -Skeletal survey on 10/05/2019 did not show any lytic lesions.  2. Leg swelling: -He will continue Lasix 80 mg twice daily.  3. Normocytic anemia: -This is from CKD and iron deficiency. -He received Feraheme today. His labs show a percent saturation of 10 and ferritin of 171 on 05/18/2019. -He is also receiving Retacrit 20,000 units as needed. Hemoglobin is 10.0.   PLAN:  1. IgG lambda MGUS: -He does not report any bone pains or recurrent infections. -Reviewed labs  from 04/06/2020.  M spike is more or less stable at 0.5 g.  Hemoglobin is 12.6.  There is elevation of kappa and lambda light chains with normal ratio which is also stable.  Kidney function with creatinine stable on dialysis around 5.4 with normal calcium.  LDH is minimally elevated. -RTC 6 months for follow-up with repeat myeloma labs.  I will also repeat skeletal survey.  No indication for further work-up or treatment at this time.  2. Leg swelling: -This is improved since dialysis.  3.  ESRD on HD: -Started on dialysis since June 2021.  Orders placed this encounter:  No orders of the defined types were placed in this encounter.    Derek Jack, MD [place] [phone]   I, Milinda Antis, am acting as a scribe for Dr. Sanda Linger.  I, Derek Jack MD, have reviewed the above documentation for accuracy and completeness, and I agree with the above.

## 2020-04-13 NOTE — Patient Instructions (Signed)
[facility] Discharge Instructions  You were seen today by Dr. Katragadda. He went over your recent results. You will be scheduled to have x-rays of your long bones done before your next visit. Dr. Katragadda will see you back in 6 months for labs and follow up.   Thank you for choosing [facility] at [facility] to provide your oncology and hematology care.  To afford each patient quality time with our provider, please arrive at least 15 minutes before your scheduled appointment time.   If you have a lab appointment with the Cancer Center please come in thru the Main Entrance and check in at the main information desk  You need to re-schedule your appointment should you arrive 10 or more minutes late.  We strive to give you quality time with our providers, and arriving late affects you and other patients whose appointments are after yours.  Also, if you no show three or more times for appointments you may be dismissed from the clinic at the providers discretion.     Again, thank you for choosing [facility].  Our hope is that these requests will decrease the amount of time that you wait before being seen by our physicians.       _____________________________________________________________  Should you have questions after your visit to  Cancer Center, please contact our office at [phone] between the hours of 8:00 a.m. and 4:30 p.m.  Voicemails left after 4:00 p.m. will not be returned until the following business day.  For prescription refill requests, have your pharmacy contact our office and allow 72 hours.    Cancer Center Support Programs:   > Cancer Support Group  2nd Tuesday of the month 1pm-2pm, Journey Room    

## 2020-04-14 ENCOUNTER — Ambulatory Visit (INDEPENDENT_AMBULATORY_CARE_PROVIDER_SITE_OTHER): Payer: Medicare Other | Admitting: Sports Medicine

## 2020-04-14 ENCOUNTER — Encounter: Payer: Self-pay | Admitting: Sports Medicine

## 2020-04-14 DIAGNOSIS — M79676 Pain in unspecified toe(s): Secondary | ICD-10-CM | POA: Diagnosis not present

## 2020-04-14 NOTE — Progress Notes (Signed)
Subjective: Henry Carroll is a 70 y.o. male patient seen today in office with complaint of pain around toenail.  Patient reports that it started with pain around the right great toenail and then slowly moved to the left for the last month.  Patient reports that he start soaking with Epson salt and the pain is now gone.  Patient reports that this could have started after he went to play golf a month ago for the first time in a while.  No other pedal complaints noted.  Patient Active Problem List   Diagnosis Date Noted  . Vitreous hemorrhage of left eye ([place]) 04/07/2020  . Multiple myeloma ([place]) 04/04/2020  . Diabetic macular edema of right eye with proliferative retinopathy associated with type 2 diabetes mellitus ([place]) 05/26/2019  . 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 Nuclear sclerotic cataract of right eye 05/26/2019  . Nuclear sclerotic cataract of left eye 05/26/2019  . CKD (chronic kidney disease), stage V ([place]) 08/30/2018  . 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 Symptomatic anemia 08/29/2018  . OSA (obstructive sleep apnea) 01/16/2018  . Small bowel lesion   . Gastric erosion   . Iron deficiency anemia 12/10/2016  . IDA (iron deficiency anemia) 01/23/2016  . Melena 01/23/2016  . 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 Bilateral lower extremity edema 12/22/2015  . Asthma, chronic, mild intermittent, with acute exacerbation 12/22/2015  . PAD (peripheral artery disease) ([place]) 03/02/2015  . Chronic systolic heart failure ([place]) 02/25/2015  . Hypertensive heart disease with heart failure ([place]) 02/25/2015  . CAD (coronary artery disease) 02/18/2015  . CAD in native artery 02/10/2015  . Abnormal stress test 02/10/2015  . Mitral regurgitation 02/10/2015  . Chronic combined systolic and diastolic CHF (congestive heart failure) ([place]) 02/10/2015  . Acute on chronic  systolic and diastolic heart failure, NYHA class 1 ([place]) 12/31/2014  . Hyperlipidemia with target LDL less than 100 12/31/2014  . Demand ischemia ([place]) 12/31/2014  . Elevated troponin   . Essential hypertension 10/09/2013  . DM type 2, uncontrolled, with renal complications ([place]) 80/99/8338  . Obesity (BMI 30-39.9) 05/05/2013  . Folliculitis of perineum 10/01/2012  . Anemia in chronic kidney disease 09/03/2012  . Other malaise and fatigue 09/03/2012  . 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 Asthma, chronic 06/04/2012  . COLONIC POLYPS, HX OF 04/13/2009    Current Outpatient Medications on File Prior to Visit  Medication Sig Dispense Refill  . acetaminophen (TYLENOL) 500 MG tablet Take 1,000 mg by mouth 2 (two) times daily as needed for moderate pain. (Patient not taking: Reported on 04/13/2020)    . ADVAIR DISKUS 100-50 MCG/DOSE AEPB SMARTSIG:1 Puff(s) Via Inhaler Twice Daily PRN    . amLODipine (NORVASC) 10 MG tablet Take 1 tablet by mouth once daily 90 tablet 1  . aspirin EC 81 MG tablet Take 1 tablet (81 mg total) by mouth daily. 90 tablet 3  . atorvastatin (LIPITOR) 40 MG tablet TAKE 1 TABLET BY MOUTH ONCE DAILY IN THE EVENING 90 tablet 1  . carvedilol (COREG) 12.5 MG tablet Take 12.5 mg by mouth daily.     . Cholecalciferol (VITAMIN D3) 25 MCG (1000 UT) CAPS Take 2 capsules by mouth daily.    . Cinnamon 500 MG capsule Take 1,000 mg by mouth daily.     Marland Kitchen CINNAMON PO Take by mouth.    . Coenzyme Q10 10 MG capsule Take 10 mg by  mouth daily.    . Coenzyme Q10 10 MG capsule Take by mouth.    Marland Kitchen epoetin alfa-epbx (RETACRIT) 96045 UNIT/ML injection 20,000 Units every 14 (fourteen) days.     Marland Kitchen ezetimibe (ZETIA) 10 MG tablet Take 1 tablet (10 mg total) by mouth daily. 90 tablet 3  . ezetimibe (ZETIA) 10 MG tablet Take by mouth.    . fluticasone (FLONASE) 50 MCG/ACT nasal spray Place 2 sprays into both nostrils daily as needed for allergies or rhinitis (SEASONAL ALLERGIES).    .  furosemide (LASIX) 40 MG tablet Take 40 mg by mouth. On S-M-W-F Takes  1 tablet by mouth once d    . glucose blood (ONE TOUCH ULTRA TEST) test strip Use to test blood sugar three times daily. Dx: E11.21 900 each 1  . insulin detemir (LEVEMIR) 100 UNIT/ML injection Inject 0.18 mLs (18 Units total) into the skin daily. In the morning 10 mL 3  . isosorbide dinitrate (ISORDIL) 10 MG tablet Take by mouth.    . isosorbide mononitrate (IMDUR) 30 MG 24 hr tablet TAKE 1 TABLET BY MOUTH ONCE DAILY -- PATIENT NEEDS APPOINTMENT FOR FUTURE REFILLS 90 tablet 3  . Methoxy PEG-Epoetin Beta (MIRCERA IJ) Mircera    . montelukast (SINGULAIR) 10 MG tablet TAKE 1 TABLET BY MOUTH AT BEDTIME 90 tablet 1  . Multiple Vitamin (MULTIVITAMIN WITH MINERALS) TABS tablet Take 1 tablet by mouth daily.    . nitroGLYCERIN (NITROSTAT) 0.4 MG SL tablet Place 1 tablet (0.4 mg total) under the tongue every 5 (five) minutes as needed for chest pain. 25 tablet 3  . PROAIR HFA 108 (90 Base) MCG/ACT inhaler INHALE 2 PUFFS BY MOUTH EVERY 6 HOURS AS NEEDED FOR SHORTNESS OF BREATH 18 g 0  . sevelamer carbonate (RENVELA) 800 MG tablet Take 800 mg by mouth 3 (three) times daily with meals.    . vitamin E 45 MG (100 UNITS) capsule Take by mouth.     Current Facility-Administered Medications on File Prior to Visit  Medication Dose Route Frequency Provider Last Rate Last Admin  . regadenoson (LEXISCAN) injection SOLN 0.4 mg  0.4 mg Intravenous Once Jerline Pain, MD        No Known Allergies  Objective: Physical Exam  General: Well developed, nourished, no acute distress, awake, alert and oriented x 3  Vascular: Dorsalis pedis artery 2/4 bilateral, Posterior tibial artery 1/4 bilateral, skin temperature warm to warm proximal to distal bilateral lower extremities, no varicosities, pedal hair present bilateral.  Neurological: Gross sensation present via light touch bilateral.   Dermatological: Skin is warm, dry, and supple bilateral,  minimal incurvation bilateral hallux nails with reactive keratosis with decreased edema and area of resolved previous paronychia. No acute signs of infection bilateral.  Musculoskeletal: No symptomatic boney deformities noted bilateral. Muscular strength within normal limits without painon range of motion. No pain with calf compression bilateral.  Assessment and Plan:  Problem List Items Addressed This Visit   None   Visit Diagnoses    Pain around toenail    -  Primary      -Examined patient.  -Discussed treatment options for self resolving ingrown toenails -Mechanically debrided bilateral hallux and advised patient to keep toenails file weekly to prevent them from growing out long and digging back in again -If there is a recurrence of pain resume soaking with Epson salt and to return to office for nail procedure however since these are self resolving no procedure is indicated at this time -Patient  to return as needed or sooner if symptoms worsen.  Landis Martins, DPM

## 2020-04-15 DIAGNOSIS — N2581 Secondary hyperparathyroidism of renal origin: Secondary | ICD-10-CM | POA: Diagnosis not present

## 2020-04-15 DIAGNOSIS — Z992 Dependence on renal dialysis: Secondary | ICD-10-CM | POA: Diagnosis not present

## 2020-04-15 DIAGNOSIS — N186 End stage renal disease: Secondary | ICD-10-CM | POA: Diagnosis not present

## 2020-04-15 DIAGNOSIS — E1129 Type 2 diabetes mellitus with other diabetic kidney complication: Secondary | ICD-10-CM | POA: Diagnosis not present

## 2020-04-15 DIAGNOSIS — D472 Monoclonal gammopathy: Secondary | ICD-10-CM | POA: Diagnosis not present

## 2020-04-18 DIAGNOSIS — E1129 Type 2 diabetes mellitus with other diabetic kidney complication: Secondary | ICD-10-CM | POA: Diagnosis not present

## 2020-04-18 DIAGNOSIS — N2581 Secondary hyperparathyroidism of renal origin: Secondary | ICD-10-CM | POA: Diagnosis not present

## 2020-04-18 DIAGNOSIS — Z992 Dependence on renal dialysis: Secondary | ICD-10-CM | POA: Diagnosis not present

## 2020-04-18 DIAGNOSIS — H25813 Combined forms of age-related cataract, bilateral: Secondary | ICD-10-CM | POA: Diagnosis not present

## 2020-04-18 DIAGNOSIS — N186 End stage renal disease: Secondary | ICD-10-CM | POA: Diagnosis not present

## 2020-04-18 DIAGNOSIS — H4312 Vitreous hemorrhage, left eye: Secondary | ICD-10-CM | POA: Diagnosis not present

## 2020-04-18 DIAGNOSIS — D472 Monoclonal gammopathy: Secondary | ICD-10-CM | POA: Diagnosis not present

## 2020-04-20 DIAGNOSIS — N2581 Secondary hyperparathyroidism of renal origin: Secondary | ICD-10-CM | POA: Diagnosis not present

## 2020-04-20 DIAGNOSIS — E1129 Type 2 diabetes mellitus with other diabetic kidney complication: Secondary | ICD-10-CM | POA: Diagnosis not present

## 2020-04-20 DIAGNOSIS — Z992 Dependence on renal dialysis: Secondary | ICD-10-CM | POA: Diagnosis not present

## 2020-04-20 DIAGNOSIS — D472 Monoclonal gammopathy: Secondary | ICD-10-CM | POA: Diagnosis not present

## 2020-04-20 DIAGNOSIS — N186 End stage renal disease: Secondary | ICD-10-CM | POA: Diagnosis not present

## 2020-04-22 DIAGNOSIS — Z992 Dependence on renal dialysis: Secondary | ICD-10-CM | POA: Diagnosis not present

## 2020-04-22 DIAGNOSIS — D472 Monoclonal gammopathy: Secondary | ICD-10-CM | POA: Diagnosis not present

## 2020-04-22 DIAGNOSIS — N186 End stage renal disease: Secondary | ICD-10-CM | POA: Diagnosis not present

## 2020-04-22 DIAGNOSIS — N2581 Secondary hyperparathyroidism of renal origin: Secondary | ICD-10-CM | POA: Diagnosis not present

## 2020-04-22 DIAGNOSIS — E1129 Type 2 diabetes mellitus with other diabetic kidney complication: Secondary | ICD-10-CM | POA: Diagnosis not present

## 2020-04-25 DIAGNOSIS — D472 Monoclonal gammopathy: Secondary | ICD-10-CM | POA: Diagnosis not present

## 2020-04-25 DIAGNOSIS — N186 End stage renal disease: Secondary | ICD-10-CM | POA: Diagnosis not present

## 2020-04-25 DIAGNOSIS — N2581 Secondary hyperparathyroidism of renal origin: Secondary | ICD-10-CM | POA: Diagnosis not present

## 2020-04-25 DIAGNOSIS — E1129 Type 2 diabetes mellitus with other diabetic kidney complication: Secondary | ICD-10-CM | POA: Diagnosis not present

## 2020-04-25 DIAGNOSIS — Z992 Dependence on renal dialysis: Secondary | ICD-10-CM | POA: Diagnosis not present

## 2020-04-27 DIAGNOSIS — N2581 Secondary hyperparathyroidism of renal origin: Secondary | ICD-10-CM | POA: Diagnosis not present

## 2020-04-27 DIAGNOSIS — D472 Monoclonal gammopathy: Secondary | ICD-10-CM | POA: Diagnosis not present

## 2020-04-27 DIAGNOSIS — E1129 Type 2 diabetes mellitus with other diabetic kidney complication: Secondary | ICD-10-CM | POA: Diagnosis not present

## 2020-04-27 DIAGNOSIS — N186 End stage renal disease: Secondary | ICD-10-CM | POA: Diagnosis not present

## 2020-04-27 DIAGNOSIS — Z992 Dependence on renal dialysis: Secondary | ICD-10-CM | POA: Diagnosis not present

## 2020-04-28 ENCOUNTER — Other Ambulatory Visit: Payer: Self-pay

## 2020-04-28 ENCOUNTER — Ambulatory Visit (INDEPENDENT_AMBULATORY_CARE_PROVIDER_SITE_OTHER): Payer: Medicare Other | Admitting: Ophthalmology

## 2020-04-28 DIAGNOSIS — H2512 Age-related nuclear cataract, left eye: Secondary | ICD-10-CM | POA: Diagnosis not present

## 2020-04-28 DIAGNOSIS — H2511 Age-related nuclear cataract, right eye: Secondary | ICD-10-CM | POA: Diagnosis not present

## 2020-04-28 DIAGNOSIS — E113512 Type 2 diabetes mellitus with proliferative diabetic retinopathy with macular edema, left eye: Secondary | ICD-10-CM

## 2020-04-28 DIAGNOSIS — H4312 Vitreous hemorrhage, left eye: Secondary | ICD-10-CM

## 2020-04-28 NOTE — Assessment & Plan Note (Signed)
With associated vitreous hemorrhage left eye will need cataract traction with intraocular lens placement first followed thereafter by vitrectomy endolaser panretinal photocoagulation so that the entire anterior hyaloid can be removed at the time of vitrectomy surgery

## 2020-04-28 NOTE — Assessment & Plan Note (Signed)
Schedule for cataract surgery left eye May 04, 2020 with Dr. Rutherford Guys

## 2020-04-28 NOTE — Progress Notes (Signed)
04/28/2020     CHIEF COMPLAINT Patient presents for No chief complaint on file.   HISTORY OF PRESENT ILLNESS: Henry Carroll is a 70 y.o. male who presents to the clinic today for:   HPI    Patient is scheduled for catheter extraction with intraocular lens placement left eye in the near future followed thereafter by the right eye   Last edited by Hurman Horn, MD on 04/28/2020  4:25 PM. (History)      Referring physician: [name], NP [place],  [place] [id number]  HISTORICAL INFORMATION:   Selected notes from the MEDICAL RECORD NUMBER    Lab Results  Component Value Date   HGBA1C 7.0 03/03/2020     CURRENT MEDICATIONS: No current outpatient medications on file. (Ophthalmic Drugs)   No current facility-administered medications for this visit. (Ophthalmic Drugs)   Current Outpatient Medications (Other)  Medication Sig  . acetaminophen (TYLENOL) 500 MG tablet Take 1,000 mg by mouth 2 (two) times daily as needed for moderate pain. (Patient not taking: Reported on 04/13/2020)  . ADVAIR DISKUS 100-50 MCG/DOSE AEPB SMARTSIG:1 Puff(s) Via Inhaler Twice Daily PRN  . amLODipine (NORVASC) 10 MG tablet Take 1 tablet by mouth once daily  . aspirin EC 81 MG tablet Take 1 tablet (81 mg total) by mouth daily.  Marland Kitchen atorvastatin (LIPITOR) 40 MG tablet TAKE 1 TABLET BY MOUTH ONCE DAILY IN THE EVENING  . carvedilol (COREG) 12.5 MG tablet Take 12.5 mg by mouth daily.   . Cholecalciferol (VITAMIN D3) 25 MCG (1000 UT) CAPS Take 2 capsules by mouth daily.  . Cinnamon 500 MG capsule Take 1,000 mg by mouth daily.   Marland Kitchen CINNAMON PO Take by mouth.  . Coenzyme Q10 10 MG capsule Take 10 mg by mouth daily.  . Coenzyme Q10 10 MG capsule Take by mouth.  Marland Kitchen epoetin alfa-epbx (RETACRIT) 78242 UNIT/ML injection 20,000 Units every 14 (fourteen) days.   Marland Kitchen ezetimibe (ZETIA) 10 MG tablet Take 1 tablet (10 mg total) by mouth daily.  Marland Kitchen ezetimibe (ZETIA) 10 MG tablet Take by mouth.  .  fluticasone (FLONASE) 50 MCG/ACT nasal spray Place 2 sprays into both nostrils daily as needed for allergies or rhinitis (SEASONAL ALLERGIES).  . furosemide (LASIX) 40 MG tablet Take 40 mg by mouth. On S-M-W-F Takes  1 tablet by mouth once d  . glucose blood (ONE TOUCH ULTRA TEST) test strip Use to test blood sugar three times daily. Dx: E11.21  . insulin detemir (LEVEMIR) 100 UNIT/ML injection Inject 0.18 mLs (18 Units total) into the skin daily. In the morning  . isosorbide dinitrate (ISORDIL) 10 MG tablet Take by mouth.  . isosorbide mononitrate (IMDUR) 30 MG 24 hr tablet TAKE 1 TABLET BY MOUTH ONCE DAILY -- PATIENT NEEDS APPOINTMENT FOR FUTURE REFILLS  . Methoxy PEG-Epoetin Beta (MIRCERA IJ) Mircera  . montelukast (SINGULAIR) 10 MG tablet TAKE 1 TABLET BY MOUTH AT BEDTIME  . Multiple Vitamin (MULTIVITAMIN WITH MINERALS) TABS tablet Take 1 tablet by mouth daily.  . nitroGLYCERIN (NITROSTAT) 0.4 MG SL tablet Place 1 tablet (0.4 mg total) under the tongue every 5 (five) minutes as needed for chest pain.  Marland Kitchen PROAIR HFA 108 (90 Base) MCG/ACT inhaler INHALE 2 PUFFS BY MOUTH EVERY 6 HOURS AS NEEDED FOR SHORTNESS OF BREATH  . sevelamer carbonate (RENVELA) 800 MG tablet Take 800 mg by mouth 3 (three) times daily with meals.  . vitamin E 45 MG (100 UNITS) capsule Take by  mouth.   No current facility-administered medications for this visit. (Other)   Facility-Administered Medications Ordered in Other Visits (Other)  Medication Route  . regadenoson (LEXISCAN) injection SOLN 0.4 mg Intravenous      REVIEW OF SYSTEMS:    ALLERGIES No Known Allergies  PAST MEDICAL HISTORY Past Medical History:  Diagnosis Date  . Abnormal stress test 02/10/2015  . Acute on chronic systolic and diastolic heart failure, NYHA class 1 ([place]) 12/31/2014  . Anemia   . Anemia in chronic kidney disease 09/03/2012  . 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 Asthma   . Asthma, chronic 06/04/2012  . Asthma, chronic, mild  intermittent, with acute exacerbation 12/22/2015  . Bilateral lower extremity edema 12/22/2015  . CAD (coronary artery disease) 02/18/2015  . Chronic combined systolic (congestive) and diastolic (congestive) heart failure ([place])    a. 12/31/14: 2D ECHO: EF 40-45%, HK of inf myocardium, G1DD, mod MR  . Chronic combined systolic and diastolic CHF (congestive heart failure) ([place]) 02/10/2015  . Chronic systolic heart failure ([place]) 02/25/2015  . CKD (chronic kidney disease), stage III (HCC)    stage 3 kidney disease  . CKD (chronic kidney disease), stage V ([place]) 08/30/2018  . COLONIC POLYPS, HX OF 04/13/2009   Qualifier: Diagnosis of  By: [place] Pam.   . Coronary artery disease    a. LHC 01/2015 - triple vessel CAD (mod oLM, mLAD, severe mRCA, intermediate branch stenosis, CTO of mCx). Plan CABG 02/2015.  Marland Kitchen Demand ischemia ([place]) 12/31/2014  . DM type 2, uncontrolled, with renal complications ([place]) 7/82/4235  . Dyspnea     due to fluid  . Elevated troponin   . Essential hypertension 10/09/2013  . Folliculitis of perineum 10/01/2012  . Gastric erosion   . GERD (gastroesophageal reflux disease)   . History of blood transfusion   . Hyperlipidemia   . Hyperlipidemia with target LDL less than 100 12/31/2014  . Hypertension   . Hypertensive heart disease with heart failure ([place]) 02/25/2015  . IDA (iron deficiency anemia) 01/23/2016  . Melena 01/23/2016  . 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 Mitral regurgitation    a. Mild-mod by echo 12/2014.  . Morbid obesity ([place]) 12/29/2014  . Myocardial infarction [facility])    pt. states per [name] he has in the past  . NSVT (nonsustained ventricular tachycardia) ([place])    a. 9 beats during 01/2015 adm. BB titrated.  . Obesity    a. BMI 33  . Obesity (BMI 30-39.9) 05/05/2013  . OSA (obstructive sleep apnea) 01/16/2018   Mild obstructive sleep apnea overall with an AHI of 7.3/h and no significant central sleep apnea. Severe  obstructive sleep apnea during REM sleep with an AHI of 34.3/h.  Now on CPAP at 6 cm H2O.   . Other malaise and fatigue 09/03/2012  . 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 PAD (peripheral artery disease) ([place]) 03/02/2015  . Pneumonia   . Sleep apnea    2019, Dr.Turner diagnoised   . Small bowel lesion   . Symptomatic anemia 08/29/2018  . 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 Type II diabetes mellitus ([place])   . Vitreous hemorrhage of right eye ([place]) 05/26/2019  . Wears dentures    Past Surgical History:  Procedure Laterality Date  . BACK SURGERY    . BASCILIC VEIN TRANSPOSITION Left 11/14/2018   Procedure: FIRST STAGE BASILIC VEIN TRANSPOSITION LEFT ARM;  Surgeon: Serafina Mitchell, MD;  Location: [facility];  Service: Vascular;  Laterality: Left;  . BASCILIC VEIN TRANSPOSITION Left 01/28/2019   Procedure: BASILIC VEIN TRANSPOSITION SECOND STAGE;  Surgeon: Serafina Mitchell, MD;  Location: [facility];  Service: Vascular;  Laterality: Left;  . BONE MARROW BIOPSY    . CARDIAC CATHETERIZATION N/A 02/09/2015   Procedure: Left Heart Cath and Coronary Angiography;  Surgeon: Burnell Blanks, MD;  Location: [facility];  Service: Cardiovascular;  Laterality: N/A;  . COLONOSCOPY  2011   Dr. [name]: Ileocecal valve appeared normal, scattered pancolonic diverticulosis, difficult bowel prep making smaller lesions potentially missed. Recommended three-year follow-up colonoscopy.  . COLONOSCOPY  2003   Dr. [name]: Suspicious lesion at the ileocecal valve, multiple biopsies benign, pancolonic diverticulosis.  . COLONOSCOPY N/A 02/15/2016   diverticulosis in sigmoid and descending colon, single 5 mm polyp at splenic flexure (Tubular adenoma)  . CORONARY ARTERY BYPASS GRAFT N/A 02/18/2015   Procedure: CORONARY ARTERY BYPASS GRAFTING (CABG) x  four, using bilateral internal mammary arteries and right leg greater saphenous vein harvested endoscopically;  Surgeon: [name], MD;  Location: [facility];   Service: Open Heart Surgery;  Laterality: N/A;  . ESOPHAGOGASTRODUODENOSCOPY N/A 02/15/2016   normal  . EYE SURGERY Right    July 2019   . GIVENS CAPSULE STUDY N/A 01/08/2017   couple of gastric and small bowel eroions in setting of aspirin 81 mg daily but nothing concerning, continue Hematology follow-up  . HERNIA REPAIR  [id number]   Umbilical  . LUMBAR [place] SURGERY  2006   L4 & L5  . REFRACTIVE SURGERY Left    2019   . REFRACTIVE SURGERY Right    2019  . TEE WITHOUT CARDIOVERSION N/A 02/18/2015   Procedure: TRANSESOPHAGEAL ECHOCARDIOGRAM (TEE);  Surgeon: [name], MD;  Location: [facility];  Service: Open Heart Surgery;  Laterality: N/A;  . TONSILLECTOMY  1962    FAMILY HISTORY Family History  Problem Relation Age of Onset  . Diabetes Mother   . Heart disease Mother        later in life, age >23  . Heart disease Father        heart failure later in life  . Hypertension Father   . Stroke Sister   . Kidney disease Daughter   . Sudden death Daughter   . Colon cancer Paternal Uncle        Passed from colon CA  . Heart attack Neg Hx     SOCIAL HISTORY Social History   Tobacco Use  . Smoking status: Former Smoker    Years: 17.00    Types: Cigars    Quit date: 12/31/2014    Years since quitting: 5.3  . Smokeless tobacco: Never Used  Vaping Use  . Vaping Use: Never used  Substance Use Topics  . Alcohol use: Yes    Alcohol/week: 3.0 standard drinks    Types: 3 Glasses of wine per week    Comment: occasional glass of wine.  . Drug use: No         OPHTHALMIC EXAM:  Base Eye Exam    Visual Acuity (ETDRS)      Right Left   Dist [place]  20/100   Dist ph [place] 20/80 NI   Dist ph cc 20/50        Tonometry (Tonopen, 4:23 PM)      Right Left   Pressure 17 17       Neuro/Psych    Oriented x3: Yes   Mood/Affect: Normal  Dilation    Both eyes:         Slit Lamp and Fundus Exam    External Exam      Right Left   External Normal Normal       Slit Lamp Exam       Right Left   Lids/Lashes Normal Normal   Conjunctiva/Sclera White and quiet White and quiet   Cornea Clear Clear   Anterior Chamber Deep and quiet Deep and quiet   Iris Round and reactive Round and reactive   Lens 3+ Nuclear sclerosis 3+ Nuclear sclerosis   Anterior Vitreous Normal Normal       Fundus Exam      Right Left   Posterior Vitreous Clear, avitric Vitreous hemorrhage 3+   Disc Normal hazy view   C/D Ratio 0.25 0.4   Macula Macular thickening,  Mild clinically significant macular edema, Exudates, Microaneurysms Microaneurysms, Clinically significant macular edema, Mild clinically significant macular edema   Vessels Quiet proliferative diabetic retinopathy Active proliferative diabetic retinopathy   Periphery Laser scars, good PRP room for PRP INF          IMAGING AND PROCEDURES  Imaging and Procedures for 04/28/20  Color Fundus Photography Optos - OU - Both Eyes       Right Eye Progression has been stable. Disc findings include normal observations. Macula : microaneurysms.   Left Eye Progression has been stable. Disc findings include normal observations.   Notes OD, proliferative diabetic retinopathy completely quiescent good PRP 360 clear media  OS,  moderate to dense vitreous hemorrhage, centrally, room inferiorly for PRP OS Will need completion of PRP but most importantly will need to schedule vitrectomy.                ASSESSMENT/PLAN:  Vitreous hemorrhage of left eye (HCC) Nonclearing vitreous hemorrhage of the left eye in conjunction with cataract is hampering acuity.  Patient is scheduled for cataract surgery with intraocular lens placement in the left eye soon with Dr. Rutherford Guys to be followed soon thereafter by schedule vitrectomy endolaser, Dr. 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 Rutherford Guys  Nuclear sclerotic cataract of right eye Scheduled already for cataract surgery in the right eye in the early portions of April      ICD-10-CM   1. 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 Vitreous hemorrhage of left eye (HCC)  H43.12 Color Fundus Photography Optos - OU - Both Eyes  3. Nuclear sclerotic cataract of left eye  H25.12   4. Nuclear sclerotic cataract of right eye  H25.11     1.  We will plan vitrectomy endolaser left eye once cataract surgery left eye is completed.  Cataract extraction is planned for 05/04/2020 with Dr. Rutherford Guys  Vitrectomy surgery will be planned on May 11, 2020 left eye under local MAC anesthesia at [facility]  2.  Preoperative instructions completed today.  Patient is to continue to use a topical eye medications in  the left eye for cataract surgery as planned and reviewed by Dr. Kellie Moor office with the patient.  He will use these for the week prior to having vitrectomy surgery in the left eye on May 11, 2020  3.  Risk and benefits of surgery vitrectomy laser discussed as patient understands this will clear out the dark hemorrhage and replaced with clear fluid and rendered quiescent supportive diabetic retinopathy long-term recovering visual acuity to the best possible functioning  Ophthalmic Meds Ordered this visit:  No orders of the defined types were placed in this encounter.      Return ,,, [facility], for Schedule vitrectomy and endolaser left eye March 30, 2:30 PM,  OS.  There are no Patient Instructions on file for this visit.   Explained the diagnoses, plan, and follow up with the patient and they expressed understanding.  Patient expressed understanding of the importance of proper follow up care.   Clent Demark Alantis Bethune M.D. Diseases & Surgery of the Retina and Vitreous Retina & Diabetic [place] 04/28/20     Abbreviations: M myopia (nearsighted); A astigmatism; H hyperopia (farsighted); P presbyopia; Mrx spectacle prescription;  CTL contact lenses; OD right eye; OS left eye; OU both eyes  XT exotropia; ET esotropia; PEK punctate epithelial keratitis; PEE punctate epithelial erosions; DES dry eye syndrome; MGD meibomian gland dysfunction; ATs artificial tears; PFAT's preservative free artificial tears; [place] nuclear sclerotic cataract; PSC posterior subcapsular cataract; ERM epi-retinal membrane; PVD posterior vitreous detachment; RD retinal detachment; DM diabetes mellitus; DR diabetic retinopathy; NPDR non-proliferative diabetic retinopathy; PDR proliferative diabetic retinopathy; CSME clinically significant macular edema; DME diabetic macular edema; dbh dot blot hemorrhages; CWS cotton wool spot; POAG primary open angle glaucoma; C/D cup-to-disc ratio; HVF humphrey visual field; GVF goldmann visual field; OCT optical coherence tomography; IOP intraocular pressure; BRVO Branch retinal vein occlusion; CRVO central retinal vein occlusion; CRAO central retinal artery occlusion; BRAO branch retinal artery occlusion; RT retinal tear; SB scleral buckle; PPV pars plana vitrectomy; VH Vitreous hemorrhage; PRP panretinal laser photocoagulation; IVK intravitreal kenalog; VMT vitreomacular traction; MH Macular hole;  NVD neovascularization of the disc; NVE neovascularization elsewhere; AREDS age related eye disease study; ARMD age related macular degeneration; POAG primary open angle glaucoma; EBMD epithelial/anterior basement membrane dystrophy; ACIOL anterior chamber  intraocular lens; IOL intraocular lens; PCIOL posterior chamber intraocular lens; Phaco/IOL phacoemulsification with intraocular lens placement; [place] photorefractive keratectomy; LASIK laser assisted in situ keratomileusis; HTN hypertension; DM diabetes mellitus; COPD chronic obstructive pulmonary disease

## 2020-04-28 NOTE — Assessment & Plan Note (Signed)
Scheduled already for cataract surgery in the right eye in the early portions of April

## 2020-04-28 NOTE — Assessment & Plan Note (Signed)
Nonclearing vitreous hemorrhage of the left eye in conjunction with cataract is hampering acuity.  Patient is scheduled for cataract surgery with intraocular lens placement in the left eye soon with Dr. Rutherford Guys to be followed soon thereafter by schedule vitrectomy endolaser, Dr. Zadie Rhine

## 2020-04-29 DIAGNOSIS — N186 End stage renal disease: Secondary | ICD-10-CM | POA: Diagnosis not present

## 2020-04-29 DIAGNOSIS — N2581 Secondary hyperparathyroidism of renal origin: Secondary | ICD-10-CM | POA: Diagnosis not present

## 2020-04-29 DIAGNOSIS — Z992 Dependence on renal dialysis: Secondary | ICD-10-CM | POA: Diagnosis not present

## 2020-04-29 DIAGNOSIS — D472 Monoclonal gammopathy: Secondary | ICD-10-CM | POA: Diagnosis not present

## 2020-04-29 DIAGNOSIS — E1129 Type 2 diabetes mellitus with other diabetic kidney complication: Secondary | ICD-10-CM | POA: Diagnosis not present

## 2020-05-02 DIAGNOSIS — Z992 Dependence on renal dialysis: Secondary | ICD-10-CM | POA: Diagnosis not present

## 2020-05-02 DIAGNOSIS — E1129 Type 2 diabetes mellitus with other diabetic kidney complication: Secondary | ICD-10-CM | POA: Diagnosis not present

## 2020-05-02 DIAGNOSIS — N186 End stage renal disease: Secondary | ICD-10-CM | POA: Diagnosis not present

## 2020-05-02 DIAGNOSIS — N2581 Secondary hyperparathyroidism of renal origin: Secondary | ICD-10-CM | POA: Diagnosis not present

## 2020-05-02 DIAGNOSIS — D472 Monoclonal gammopathy: Secondary | ICD-10-CM | POA: Diagnosis not present

## 2020-05-03 DIAGNOSIS — Z992 Dependence on renal dialysis: Secondary | ICD-10-CM | POA: Diagnosis not present

## 2020-05-03 DIAGNOSIS — E1129 Type 2 diabetes mellitus with other diabetic kidney complication: Secondary | ICD-10-CM | POA: Diagnosis not present

## 2020-05-03 DIAGNOSIS — N2581 Secondary hyperparathyroidism of renal origin: Secondary | ICD-10-CM | POA: Diagnosis not present

## 2020-05-03 DIAGNOSIS — D472 Monoclonal gammopathy: Secondary | ICD-10-CM | POA: Diagnosis not present

## 2020-05-03 DIAGNOSIS — N186 End stage renal disease: Secondary | ICD-10-CM | POA: Diagnosis not present

## 2020-05-04 DIAGNOSIS — H2512 Age-related nuclear cataract, left eye: Secondary | ICD-10-CM | POA: Diagnosis not present

## 2020-05-04 DIAGNOSIS — H25811 Combined forms of age-related cataract, right eye: Secondary | ICD-10-CM | POA: Diagnosis not present

## 2020-05-04 DIAGNOSIS — Z961 Presence of intraocular lens: Secondary | ICD-10-CM | POA: Diagnosis not present

## 2020-05-04 DIAGNOSIS — Z9842 Cataract extraction status, left eye: Secondary | ICD-10-CM | POA: Diagnosis not present

## 2020-05-04 DIAGNOSIS — Z48810 Encounter for surgical aftercare following surgery on the sense organs: Secondary | ICD-10-CM | POA: Diagnosis not present

## 2020-05-04 DIAGNOSIS — H25012 Cortical age-related cataract, left eye: Secondary | ICD-10-CM | POA: Diagnosis not present

## 2020-05-06 DIAGNOSIS — N186 End stage renal disease: Secondary | ICD-10-CM | POA: Diagnosis not present

## 2020-05-06 DIAGNOSIS — D472 Monoclonal gammopathy: Secondary | ICD-10-CM | POA: Diagnosis not present

## 2020-05-06 DIAGNOSIS — Z992 Dependence on renal dialysis: Secondary | ICD-10-CM | POA: Diagnosis not present

## 2020-05-06 DIAGNOSIS — N2581 Secondary hyperparathyroidism of renal origin: Secondary | ICD-10-CM | POA: Diagnosis not present

## 2020-05-06 DIAGNOSIS — E1129 Type 2 diabetes mellitus with other diabetic kidney complication: Secondary | ICD-10-CM | POA: Diagnosis not present

## 2020-05-11 DIAGNOSIS — Z992 Dependence on renal dialysis: Secondary | ICD-10-CM | POA: Diagnosis not present

## 2020-05-11 DIAGNOSIS — N2581 Secondary hyperparathyroidism of renal origin: Secondary | ICD-10-CM | POA: Diagnosis not present

## 2020-05-11 DIAGNOSIS — D472 Monoclonal gammopathy: Secondary | ICD-10-CM | POA: Diagnosis not present

## 2020-05-11 DIAGNOSIS — E1129 Type 2 diabetes mellitus with other diabetic kidney complication: Secondary | ICD-10-CM | POA: Diagnosis not present

## 2020-05-11 DIAGNOSIS — N186 End stage renal disease: Secondary | ICD-10-CM | POA: Diagnosis not present

## 2020-05-12 ENCOUNTER — Encounter (INDEPENDENT_AMBULATORY_CARE_PROVIDER_SITE_OTHER): Payer: Medicare Other | Admitting: Ophthalmology

## 2020-05-13 DIAGNOSIS — D472 Monoclonal gammopathy: Secondary | ICD-10-CM | POA: Diagnosis not present

## 2020-05-13 DIAGNOSIS — N186 End stage renal disease: Secondary | ICD-10-CM | POA: Diagnosis not present

## 2020-05-13 DIAGNOSIS — E1129 Type 2 diabetes mellitus with other diabetic kidney complication: Secondary | ICD-10-CM | POA: Diagnosis not present

## 2020-05-13 DIAGNOSIS — N2581 Secondary hyperparathyroidism of renal origin: Secondary | ICD-10-CM | POA: Diagnosis not present

## 2020-05-13 DIAGNOSIS — E1122 Type 2 diabetes mellitus with diabetic chronic kidney disease: Secondary | ICD-10-CM | POA: Diagnosis not present

## 2020-05-13 DIAGNOSIS — Z992 Dependence on renal dialysis: Secondary | ICD-10-CM | POA: Diagnosis not present

## 2020-05-16 DIAGNOSIS — Z20828 Contact with and (suspected) exposure to other viral communicable diseases: Secondary | ICD-10-CM | POA: Diagnosis not present

## 2020-05-16 DIAGNOSIS — Z992 Dependence on renal dialysis: Secondary | ICD-10-CM | POA: Diagnosis not present

## 2020-05-16 DIAGNOSIS — N2581 Secondary hyperparathyroidism of renal origin: Secondary | ICD-10-CM | POA: Diagnosis not present

## 2020-05-16 DIAGNOSIS — U071 COVID-19: Secondary | ICD-10-CM | POA: Diagnosis not present

## 2020-05-16 DIAGNOSIS — E1129 Type 2 diabetes mellitus with other diabetic kidney complication: Secondary | ICD-10-CM | POA: Diagnosis not present

## 2020-05-16 DIAGNOSIS — D472 Monoclonal gammopathy: Secondary | ICD-10-CM | POA: Diagnosis not present

## 2020-05-16 DIAGNOSIS — N186 End stage renal disease: Secondary | ICD-10-CM | POA: Diagnosis not present

## 2020-05-18 DIAGNOSIS — Z992 Dependence on renal dialysis: Secondary | ICD-10-CM | POA: Diagnosis not present

## 2020-05-18 DIAGNOSIS — E1129 Type 2 diabetes mellitus with other diabetic kidney complication: Secondary | ICD-10-CM | POA: Diagnosis not present

## 2020-05-18 DIAGNOSIS — N2581 Secondary hyperparathyroidism of renal origin: Secondary | ICD-10-CM | POA: Diagnosis not present

## 2020-05-18 DIAGNOSIS — D472 Monoclonal gammopathy: Secondary | ICD-10-CM | POA: Diagnosis not present

## 2020-05-18 DIAGNOSIS — N186 End stage renal disease: Secondary | ICD-10-CM | POA: Diagnosis not present

## 2020-05-20 DIAGNOSIS — N2581 Secondary hyperparathyroidism of renal origin: Secondary | ICD-10-CM | POA: Diagnosis not present

## 2020-05-20 DIAGNOSIS — E1129 Type 2 diabetes mellitus with other diabetic kidney complication: Secondary | ICD-10-CM | POA: Diagnosis not present

## 2020-05-20 DIAGNOSIS — D472 Monoclonal gammopathy: Secondary | ICD-10-CM | POA: Diagnosis not present

## 2020-05-20 DIAGNOSIS — Z992 Dependence on renal dialysis: Secondary | ICD-10-CM | POA: Diagnosis not present

## 2020-05-20 DIAGNOSIS — N186 End stage renal disease: Secondary | ICD-10-CM | POA: Diagnosis not present

## 2020-05-23 DIAGNOSIS — D472 Monoclonal gammopathy: Secondary | ICD-10-CM | POA: Diagnosis not present

## 2020-05-23 DIAGNOSIS — Z992 Dependence on renal dialysis: Secondary | ICD-10-CM | POA: Diagnosis not present

## 2020-05-23 DIAGNOSIS — N2581 Secondary hyperparathyroidism of renal origin: Secondary | ICD-10-CM | POA: Diagnosis not present

## 2020-05-23 DIAGNOSIS — E1129 Type 2 diabetes mellitus with other diabetic kidney complication: Secondary | ICD-10-CM | POA: Diagnosis not present

## 2020-05-23 DIAGNOSIS — N186 End stage renal disease: Secondary | ICD-10-CM | POA: Diagnosis not present

## 2020-05-25 DIAGNOSIS — Z992 Dependence on renal dialysis: Secondary | ICD-10-CM | POA: Diagnosis not present

## 2020-05-25 DIAGNOSIS — U071 COVID-19: Secondary | ICD-10-CM | POA: Diagnosis not present

## 2020-05-25 DIAGNOSIS — N2581 Secondary hyperparathyroidism of renal origin: Secondary | ICD-10-CM | POA: Diagnosis not present

## 2020-05-25 DIAGNOSIS — E1129 Type 2 diabetes mellitus with other diabetic kidney complication: Secondary | ICD-10-CM | POA: Diagnosis not present

## 2020-05-25 DIAGNOSIS — N186 End stage renal disease: Secondary | ICD-10-CM | POA: Diagnosis not present

## 2020-05-25 DIAGNOSIS — D472 Monoclonal gammopathy: Secondary | ICD-10-CM | POA: Diagnosis not present

## 2020-05-26 ENCOUNTER — Encounter (INDEPENDENT_AMBULATORY_CARE_PROVIDER_SITE_OTHER): Payer: Medicare Other | Admitting: Ophthalmology

## 2020-05-27 DIAGNOSIS — N2581 Secondary hyperparathyroidism of renal origin: Secondary | ICD-10-CM | POA: Diagnosis not present

## 2020-05-27 DIAGNOSIS — Z992 Dependence on renal dialysis: Secondary | ICD-10-CM | POA: Diagnosis not present

## 2020-05-27 DIAGNOSIS — D472 Monoclonal gammopathy: Secondary | ICD-10-CM | POA: Diagnosis not present

## 2020-05-27 DIAGNOSIS — E1129 Type 2 diabetes mellitus with other diabetic kidney complication: Secondary | ICD-10-CM | POA: Diagnosis not present

## 2020-05-27 DIAGNOSIS — N186 End stage renal disease: Secondary | ICD-10-CM | POA: Diagnosis not present

## 2020-05-30 DIAGNOSIS — N2581 Secondary hyperparathyroidism of renal origin: Secondary | ICD-10-CM | POA: Diagnosis not present

## 2020-05-30 DIAGNOSIS — E1129 Type 2 diabetes mellitus with other diabetic kidney complication: Secondary | ICD-10-CM | POA: Diagnosis not present

## 2020-05-30 DIAGNOSIS — D472 Monoclonal gammopathy: Secondary | ICD-10-CM | POA: Diagnosis not present

## 2020-05-30 DIAGNOSIS — Z992 Dependence on renal dialysis: Secondary | ICD-10-CM | POA: Diagnosis not present

## 2020-05-30 DIAGNOSIS — N186 End stage renal disease: Secondary | ICD-10-CM | POA: Diagnosis not present

## 2020-06-01 DIAGNOSIS — N2581 Secondary hyperparathyroidism of renal origin: Secondary | ICD-10-CM | POA: Diagnosis not present

## 2020-06-01 DIAGNOSIS — D472 Monoclonal gammopathy: Secondary | ICD-10-CM | POA: Diagnosis not present

## 2020-06-01 DIAGNOSIS — E1129 Type 2 diabetes mellitus with other diabetic kidney complication: Secondary | ICD-10-CM | POA: Diagnosis not present

## 2020-06-01 DIAGNOSIS — Z992 Dependence on renal dialysis: Secondary | ICD-10-CM | POA: Diagnosis not present

## 2020-06-01 DIAGNOSIS — N186 End stage renal disease: Secondary | ICD-10-CM | POA: Diagnosis not present

## 2020-06-01 DIAGNOSIS — E039 Hypothyroidism, unspecified: Secondary | ICD-10-CM | POA: Diagnosis not present

## 2020-06-02 ENCOUNTER — Telehealth: Payer: Self-pay

## 2020-06-02 ENCOUNTER — Encounter: Payer: Self-pay | Admitting: Nurse Practitioner

## 2020-06-02 NOTE — Telephone Encounter (Signed)
I called and spoke with patient and scheduled a Video Visit on My Chart for tomorrow with Dinah Ngetich Np.

## 2020-06-03 ENCOUNTER — Other Ambulatory Visit: Payer: Self-pay

## 2020-06-03 ENCOUNTER — Ambulatory Visit (HOSPITAL_COMMUNITY)
Admission: RE | Admit: 2020-06-03 | Discharge: 2020-06-03 | Disposition: A | Discharge status: Home / Self Care | Payer: Medicare Other | Source: Ambulatory Visit | Attending: Family | Admitting: Family

## 2020-06-03 ENCOUNTER — Telehealth (INDEPENDENT_AMBULATORY_CARE_PROVIDER_SITE_OTHER): Payer: Medicare Other | Admitting: Family

## 2020-06-03 ENCOUNTER — Encounter: Payer: Self-pay | Admitting: Family

## 2020-06-03 VITALS — BP 125/62 | HR 87 | Temp 97.1°F | Ht 74.0 in | Wt 205.0 lb

## 2020-06-03 DIAGNOSIS — R059 Cough, unspecified: Secondary | ICD-10-CM | POA: Diagnosis not present

## 2020-06-03 DIAGNOSIS — Z992 Dependence on renal dialysis: Secondary | ICD-10-CM | POA: Diagnosis not present

## 2020-06-03 DIAGNOSIS — D472 Monoclonal gammopathy: Secondary | ICD-10-CM | POA: Diagnosis not present

## 2020-06-03 DIAGNOSIS — N186 End stage renal disease: Secondary | ICD-10-CM | POA: Diagnosis not present

## 2020-06-03 DIAGNOSIS — N2581 Secondary hyperparathyroidism of renal origin: Secondary | ICD-10-CM | POA: Diagnosis not present

## 2020-06-03 DIAGNOSIS — J9811 Atelectasis: Secondary | ICD-10-CM | POA: Diagnosis not present

## 2020-06-03 DIAGNOSIS — E1129 Type 2 diabetes mellitus with other diabetic kidney complication: Secondary | ICD-10-CM | POA: Diagnosis not present

## 2020-06-03 NOTE — Progress Notes (Signed)
This service is provided via telemedicine  Vital signs were recorded and documented by [facility].D/CMA.  Location of patient (ex: home, work): Home.  Patient consents to a telephone visit: Yes.  Location of the provider (ex: office, home): [facility].  Name of any referring provider:Eubanks, Carlos American, NP   Names of all persons participating in the telemedicine service and their role in the encounter: Patient, Heriberto Antigua, [place], [place], Webb Silversmith, NP.    Time spent on call:  8 minutes spent on the phone with Medical Assistant.       Provider: Verner Kopischke FNP-C  [name], NP  Patient Care Team: [name], NP as PCP - General (Geriatric Medicine) Jerline Pain, MD as PCP - Cardiology (Cardiology) [name], MD as Consulting Physician (Gastroenterology) Zadie Rhine Clent Demark, MD as Consulting Physician (Ophthalmology) Elmarie Shiley, MD as Consulting Physician (Nephrology) Derek Jack, MD as Consulting Physician (Hematology)  Extended Emergency Contact Information Primary Emergency Contact: [facility] Address: [address]          [place], [place] [address] Johnnette Litter of [place] Phone: [phone] Mobile Phone: [phone] Relation: Spouse Secondary Emergency Contact: Andria Rhein [place] of [place] Phone: [phone] Relation: Daughter  Code Status:  DNR Goals of care: Advanced Directive information Advanced Directives 06/03/2020  Does Patient Have a Medical Advance Directive? Yes  Type of Advance Directive Out of facility DNR (pink MOST or yellow form)  Does patient want to make changes to medical advance directive? No - Patient declined  Copy of [place] in Chart? -  Would patient like information on creating a medical advance directive? -  Pre-existing out of facility DNR order (yellow form or pink MOST form) Pink MOST form placed in chart (order not valid for inpatient use)      Chief Complaint  Patient presents with  . Acute Visit    Patient complains of mild shortness of breath and phlegm.    HPI:  Pt is a 70 y.o. male seen today for an acute visit for evaluation of cough and fatigue.He states tested positive for COVID-19 05/12/2020 after coming back from a seminar in [place].He was retested at dialysis was positive.One week later COVID-19 test results was negative on 05/23/2020. He states has had mild shortness of breath just walking in the house from his bedroom to the dinning.Has been coughing up intermittently some phlegm. Feels tired all the time.No muscle aches.  Appetite is good.Drinks about 48 oz of fluid daily. His CBG runs in the 120's     Past Medical History:  Diagnosis Date  . Abnormal stress test 02/10/2015  . Acute on chronic systolic and diastolic heart failure, NYHA class 1 ([place]) 12/31/2014  . Anemia   . Anemia in chronic kidney disease 09/03/2012  . 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 Asthma   . Asthma, chronic 06/04/2012  . Asthma, chronic, mild intermittent, with acute exacerbation 12/22/2015  . Bilateral lower extremity edema 12/22/2015  . CAD (coronary artery disease) 02/18/2015  . Chronic combined systolic (congestive) and diastolic (congestive) heart failure ([place])    a. 12/31/14: 2D ECHO: EF 40-45%, HK of inf myocardium, G1DD, mod MR  . Chronic combined systolic and diastolic CHF (congestive heart failure) ([place]) 02/10/2015  . Chronic systolic heart failure ([place]) 02/25/2015  . CKD (chronic kidney disease), stage III (HCC)    stage 3 kidney disease  . CKD (chronic kidney disease), stage V ([place])  08/30/2018  . COLONIC POLYPS, HX OF 04/13/2009   Qualifier: Diagnosis of  By: Westly Pam.   . Coronary artery disease    a. LHC 01/2015 - triple vessel CAD (mod oLM, mLAD, severe mRCA, intermediate branch stenosis, CTO of mCx). Plan CABG 02/2015.  Marland Kitchen Demand ischemia ([place]) 12/31/2014  . DM type 2, uncontrolled, with renal  complications ([place]) 03/14/8655  . Dyspnea     due to fluid  . Elevated troponin   . Essential hypertension 10/09/2013  . Folliculitis of perineum 10/01/2012  . Gastric erosion   . GERD (gastroesophageal reflux disease)   . History of blood transfusion   . Hyperlipidemia   . Hyperlipidemia with target LDL less than 100 12/31/2014  . Hypertension   . Hypertensive heart disease with heart failure ([place]) 02/25/2015  . IDA (iron deficiency anemia) 01/23/2016  . Melena 01/23/2016  . 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 Mitral regurgitation    a. Mild-mod by echo 12/2014.  . Morbid obesity ([place]) 12/29/2014  . Myocardial infarction [facility])    pt. states per [name] he has in the past  . NSVT (nonsustained ventricular tachycardia) ([place])    a. 9 beats during 01/2015 adm. BB titrated.  . Obesity    a. BMI 33  . Obesity (BMI 30-39.9) 05/05/2013  . OSA (obstructive sleep apnea) 01/16/2018   Mild obstructive sleep apnea overall with an AHI of 7.3/h and no significant central sleep apnea. Severe obstructive sleep apnea during REM sleep with an AHI of 34.3/h.  Now on CPAP at 6 cm H2O.   . Other malaise and fatigue 09/03/2012  . 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 PAD (peripheral artery disease) ([place]) 03/02/2015  . Pneumonia   . Sleep apnea    2019, Dr.Turner diagnoised   . Small bowel lesion   . Symptomatic anemia 08/29/2018  . 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 Type II diabetes mellitus ([place])   . Vitreous hemorrhage of right eye ([place]) 05/26/2019  . Wears dentures    Past Surgical History:  Procedure Laterality Date  . BACK SURGERY    . BASCILIC VEIN TRANSPOSITION Left 11/14/2018   Procedure: FIRST STAGE BASILIC VEIN TRANSPOSITION LEFT ARM;  Surgeon: Serafina Mitchell, MD;  Location: [facility];  Service: Vascular;  Laterality: Left;  . BASCILIC VEIN TRANSPOSITION Left 01/28/2019   Procedure: BASILIC VEIN TRANSPOSITION SECOND STAGE;  Surgeon: Serafina Mitchell, MD;  Location: [facility];  Service: Vascular;  Laterality: Left;  . BONE MARROW BIOPSY    . CARDIAC CATHETERIZATION N/A 02/09/2015   Procedure: Left Heart Cath and Coronary Angiography;  Surgeon: Burnell Blanks, MD;  Location: [facility];  Service: Cardiovascular;  Laterality: N/A;  . COLONOSCOPY  2011   Dr. [name]: Ileocecal valve appeared normal, scattered pancolonic diverticulosis, difficult bowel prep making smaller lesions potentially missed. Recommended three-year follow-up colonoscopy.  . COLONOSCOPY  2003   Dr. [name]: Suspicious lesion at the ileocecal valve, multiple biopsies benign, pancolonic diverticulosis.  . COLONOSCOPY N/A 02/15/2016   diverticulosis in sigmoid and descending colon, single 5 mm polyp at splenic flexure (Tubular adenoma)  . CORONARY ARTERY BYPASS GRAFT N/A 02/18/2015   Procedure: CORONARY ARTERY BYPASS GRAFTING (CABG) x  four, using bilateral internal mammary arteries and right leg greater saphenous vein harvested endoscopically;  Surgeon: [name], MD;  Location: [facility];  Service: Open Heart Surgery;  Laterality: N/A;  . ESOPHAGOGASTRODUODENOSCOPY N/A 02/15/2016   normal  . EYE  SURGERY Right    July 2019   . GIVENS CAPSULE STUDY N/A 01/08/2017   couple of gastric and small bowel eroions in setting of aspirin 81 mg daily but nothing concerning, continue Hematology follow-up  . HERNIA REPAIR  1660   Umbilical  . LUMBAR [place] SURGERY  2006   L4 & L5  . REFRACTIVE SURGERY Left    2019   . REFRACTIVE SURGERY Right    2019  . TEE WITHOUT CARDIOVERSION N/A 02/18/2015   Procedure: TRANSESOPHAGEAL ECHOCARDIOGRAM (TEE);  Surgeon: [name], MD;  Location: [facility];  Service: Open Heart Surgery;  Laterality: N/A;  . TONSILLECTOMY  1962    No Known Allergies  Outpatient Encounter Medications as of 06/03/2020  Medication Sig  . acetaminophen (TYLENOL) 500 MG tablet Take 1,000 mg by mouth as needed.  Marland Kitchen ADVAIR DISKUS 100-50 MCG/DOSE AEPB SMARTSIG:1  Puff(s) Via Inhaler Twice Daily PRN  . amLODipine (NORVASC) 10 MG tablet Take 1 tablet by mouth once daily  . aspirin EC 81 MG tablet Take 1 tablet (81 mg total) by mouth daily.  Marland Kitchen atorvastatin (LIPITOR) 40 MG tablet TAKE 1 TABLET BY MOUTH ONCE DAILY IN THE EVENING  . carvedilol (COREG) 12.5 MG tablet Take 12.5 mg by mouth daily.   . Cholecalciferol (VITAMIN D3) 25 MCG (1000 UT) CAPS Take 2 capsules by mouth daily.  . Cinnamon 500 MG capsule Take 1,000 mg by mouth daily.   Marland Kitchen CINNAMON PO Take by mouth.  . Coenzyme Q10 10 MG capsule Take 10 mg by mouth daily.  . Coenzyme Q10 10 MG capsule Take by mouth.  Marland Kitchen epoetin alfa-epbx (RETACRIT) 63016 UNIT/ML injection 20,000 Units every 14 (fourteen) days.   Marland Kitchen ezetimibe (ZETIA) 10 MG tablet Take 1 tablet (10 mg total) by mouth daily.  Marland Kitchen ezetimibe (ZETIA) 10 MG tablet Take by mouth.  . fluticasone (FLONASE) 50 MCG/ACT nasal spray Place 2 sprays into both nostrils daily as needed for allergies or rhinitis (SEASONAL ALLERGIES).  . furosemide (LASIX) 40 MG tablet Take 40 mg by mouth. On S-M-W-F Takes  1 tablet by mouth once d  . glucose blood (ONE TOUCH ULTRA TEST) test strip Use to test blood sugar three times daily. Dx: E11.21  . insulin detemir (LEVEMIR) 100 UNIT/ML injection Inject 0.18 mLs (18 Units total) into the skin daily. In the morning  . isosorbide dinitrate (ISORDIL) 10 MG tablet Take by mouth.  . isosorbide mononitrate (IMDUR) 30 MG 24 hr tablet TAKE 1 TABLET BY MOUTH ONCE DAILY -- PATIENT NEEDS APPOINTMENT FOR FUTURE REFILLS  . Methoxy PEG-Epoetin Beta (MIRCERA IJ) Mircera  . montelukast (SINGULAIR) 10 MG tablet TAKE 1 TABLET BY MOUTH AT BEDTIME  . Multiple Vitamin (MULTIVITAMIN WITH MINERALS) TABS tablet Take 1 tablet by mouth daily.  . nitroGLYCERIN (NITROSTAT) 0.4 MG SL tablet Place 1 tablet (0.4 mg total) under the tongue every 5 (five) minutes as needed for chest pain.  Marland Kitchen PROAIR HFA 108 (90 Base) MCG/ACT inhaler INHALE 2 PUFFS BY MOUTH  EVERY 6 HOURS AS NEEDED FOR SHORTNESS OF BREATH  . sevelamer carbonate (RENVELA) 800 MG tablet Take 800 mg by mouth 3 (three) times daily with meals.  . vitamin E 45 MG (100 UNITS) capsule Take by mouth.  . [DISCONTINUED] acetaminophen (TYLENOL) 500 MG tablet Take 1,000 mg by mouth 2 (two) times daily as needed for moderate pain. (Patient not taking: Reported on 04/13/2020)   Facility-Administered Encounter Medications as of 06/03/2020  Medication  . regadenoson (LEXISCAN)  injection SOLN 0.4 mg    Review of Systems  Constitutional: Positive for fatigue. Negative for appetite change, chills, fever and unexpected weight change.  HENT: Negative for congestion, dental problem, ear discharge, ear pain, facial swelling, hearing loss, nosebleeds, postnasal drip, rhinorrhea, sinus pressure, sinus pain, sneezing, sore throat, tinnitus and trouble swallowing.   Eyes: Negative for pain, discharge, redness, itching and visual disturbance.  Respiratory: Negative for cough, chest tightness and wheezing.        Mild shortness of breath with exertion   Cardiovascular: Negative for chest pain, palpitations and leg swelling.  Gastrointestinal: Negative for abdominal distention, abdominal pain, blood in stool, constipation, diarrhea, nausea and vomiting.  Genitourinary: Negative for difficulty urinating, dysuria, flank pain, frequency and urgency.  Musculoskeletal: Negative for myalgias, neck pain and neck stiffness.  Skin: Negative for color change, pallor, rash and wound.  Neurological: Negative for dizziness, syncope, speech difficulty, weakness, light-headedness, numbness and headaches.  Hematological: Does not bruise/bleed easily.  Psychiatric/Behavioral: Negative for agitation, behavioral problems, confusion, hallucinations, self-injury, sleep disturbance and suicidal ideas. The patient is not nervous/anxious.     Immunization History  Administered Date(s) Administered  . Hepb-cpg 09/10/2019, 10/01/2019,  10/29/2019  . Influenza, High Dose Seasonal PF 11/26/2017, 11/07/2019  . Influenza,inj,Quad PF,6+ Mos 10/15/2012, 01/05/2014, 12/01/2014, 12/05/2015, 12/11/2016  . Influenza-Unspecified 02/04/2012  . Moderna Sars-Covid-2 Vaccination 03/22/2019, 04/22/2019, 11/26/2019  . Pneumococcal Conjugate-13 02/09/2014  . Pneumococcal Polysaccharide-23 05/05/2013  . Tdap 03/26/2011  . Zoster Recombinat (Shingrix) 01/25/2017, 03/15/2017   Pertinent  Health Maintenance Due  Topic Date Due  . HEMOGLOBIN A1C  08/31/2020  . INFLUENZA VACCINE  09/12/2020  . FOOT EXAM  04/04/2021  . OPHTHALMOLOGY EXAM  04/07/2021  . COLONOSCOPY (Pts 45-46yrs Insurance coverage will need to be confirmed)  02/14/2026  . PNA vac Low Risk Adult  Completed   Fall Risk  06/03/2020 04/04/2020 07/23/2019 06/23/2019 05/20/2019  Falls in the past year? 0 0 1 1 0  Number falls in past yr: 0 0 0 0 0  Injury with Fall? 0 0 0 0 0  Risk for fall due to : - - History of fall(s) - -  Risk for fall due to: Comment - - - - -   Functional Status Survey:    Vitals:   06/03/20 1505  BP: 125/62  Pulse: 87  Temp: (!) 97.1 F (36.2 C)  Weight: 205 lb (93 kg)  Height: $Remove'6\' 2"'[id number]$  (1.88 m)   Body mass index is 26.32 kg/m. Physical Exam Vitals reviewed.  Constitutional:      General: He is not in acute distress.    Appearance: He is not ill-appearing.  Pulmonary:     Effort: Pulmonary effort is normal. No respiratory distress.  Neurological:     Mental Status: He is alert and oriented to person, place, and time.     Labs reviewed: Recent Labs    06/15/19 1207 07/15/19 1039 08/12/19 0943 08/12/19 1054 08/12/19 1054 10/05/19 0916 10/05/19 1345 02/18/20 0000 03/03/20 0000 03/17/20 0000 03/28/20 0000 03/30/20 0000 04/06/20 1118  NA 138 135  --  135   < > 135 133*  --  136*  --   --  139 139  K 4.3 3.9  --  3.9  --  4.4 3.9 4.9  --  5.2  --   --  3.6  CL 103 98  --  100  --  93* 94*  --  92*  --   --  94* 92*  CO2  22 21*  --   21*  --  24 24  --   --   --   --   --  32  GLUCOSE 219* 179*  --  249*  --  250* 231*  --   --   --   --   --  128*  BUN 146* 132*  --  138*   < > 74* 83* 60*  --  72* 59*  --  31*  CREATININE 6.99* 6.93*  --  7.05*  --  6.60* 7.00*  --  11.1*  --   --  9.6* 5.43*  CALCIUM 8.8* 8.8*  --  8.9  --  9.2 9.0 9.3 9.6 9.4  --  9.6 9.6  MG  --   --  2.0  --   --   --   --   --   --   --   --   --   --   PHOS 5.1* 6.4*  --  5.2*  --   --   --   --   --   --   --   --   --    < > = values in this interval not displayed.   Recent Labs    10/05/19 1345 11/16/19 0930 03/03/20 0000 03/17/20 0000 04/06/20 1118  AST $Re'22 24 14  'ANX$ --  20  ALT 18 15  --   --  14  ALKPHOS 60 65 56  --  57  BILITOT 0.3 0.2  --   --  0.7  PROT 6.8 7.0  --   --  7.8  ALBUMIN 3.8 4.6  --  4.3 4.3   Recent Labs    10/05/19 0916 10/05/19 1345 03/03/20 0000 03/28/20 0000 03/30/20 0000 04/06/20 1118  WBC 5.3 5.5 6.4  --  6.6 5.2  NEUTROABS 3.0 3.2  --   --   --  3.2  HGB 10.8* 11.0* 11.5* 11.8* 12.2* 12.6*  HCT 32.6* 34.3* 34*  --  36* 37.5*  MCV 98* 99.4  --   --   --  107.8*  PLT 212 218 224  --  274 192   Lab Results  Component Value Date   TSH 0.93 03/03/2020   Lab Results  Component Value Date   HGBA1C 7.0 03/03/2020   Lab Results  Component Value Date   CHOL 127 11/16/2019   HDL 60 11/16/2019   LDLCALC 52 11/16/2019   TRIG 74 11/16/2019   CHOLHDL 2.1 11/16/2019    Significant Diagnostic Results in last 30 days:  No results found.  Assessment/Plan  Cough Afebrile. Coughs up phlegm sometimes. Will obtain chest X-ray  Order send to [facility] states lives across hospital and will head on there after visit. - Advised to notify provider or go to ED if symptoms worsen - DG Chest 2 View; Future  Family/ staff Communication: Reviewed plan of care with patient  Labs/tests ordered: CXR  Next Appointment: As needed if symptoms worsen or fail to improve   I connected with  [name] on 06/03/20 by a video enabled telemedicine application and verified that I am speaking with the correct person using two identifiers.   I discussed the limitations of evaluation and management by telemedicine. The patient expressed understanding and agreed to proceed.  Spent 14 minutes of non-face to face with patient    Sandrea Hughs, NP

## 2020-06-06 DIAGNOSIS — N2581 Secondary hyperparathyroidism of renal origin: Secondary | ICD-10-CM | POA: Diagnosis not present

## 2020-06-06 DIAGNOSIS — N186 End stage renal disease: Secondary | ICD-10-CM | POA: Diagnosis not present

## 2020-06-06 DIAGNOSIS — E1129 Type 2 diabetes mellitus with other diabetic kidney complication: Secondary | ICD-10-CM | POA: Diagnosis not present

## 2020-06-06 DIAGNOSIS — Z992 Dependence on renal dialysis: Secondary | ICD-10-CM | POA: Diagnosis not present

## 2020-06-06 DIAGNOSIS — D472 Monoclonal gammopathy: Secondary | ICD-10-CM | POA: Diagnosis not present

## 2020-06-08 DIAGNOSIS — H25011 Cortical age-related cataract, right eye: Secondary | ICD-10-CM | POA: Diagnosis not present

## 2020-06-08 DIAGNOSIS — H2511 Age-related nuclear cataract, right eye: Secondary | ICD-10-CM | POA: Diagnosis not present

## 2020-06-09 DIAGNOSIS — N2581 Secondary hyperparathyroidism of renal origin: Secondary | ICD-10-CM | POA: Diagnosis not present

## 2020-06-09 DIAGNOSIS — Z992 Dependence on renal dialysis: Secondary | ICD-10-CM | POA: Diagnosis not present

## 2020-06-09 DIAGNOSIS — N186 End stage renal disease: Secondary | ICD-10-CM | POA: Diagnosis not present

## 2020-06-09 DIAGNOSIS — D472 Monoclonal gammopathy: Secondary | ICD-10-CM | POA: Diagnosis not present

## 2020-06-09 DIAGNOSIS — E1129 Type 2 diabetes mellitus with other diabetic kidney complication: Secondary | ICD-10-CM | POA: Diagnosis not present

## 2020-06-10 DIAGNOSIS — Z992 Dependence on renal dialysis: Secondary | ICD-10-CM | POA: Diagnosis not present

## 2020-06-10 DIAGNOSIS — N186 End stage renal disease: Secondary | ICD-10-CM | POA: Diagnosis not present

## 2020-06-10 DIAGNOSIS — D472 Monoclonal gammopathy: Secondary | ICD-10-CM | POA: Diagnosis not present

## 2020-06-10 DIAGNOSIS — N2581 Secondary hyperparathyroidism of renal origin: Secondary | ICD-10-CM | POA: Diagnosis not present

## 2020-06-10 DIAGNOSIS — E1129 Type 2 diabetes mellitus with other diabetic kidney complication: Secondary | ICD-10-CM | POA: Diagnosis not present

## 2020-06-11 HISTORY — PX: CATARACT EXTRACTION: SUR2

## 2020-06-12 DIAGNOSIS — N186 End stage renal disease: Secondary | ICD-10-CM | POA: Diagnosis not present

## 2020-06-12 DIAGNOSIS — E1122 Type 2 diabetes mellitus with diabetic chronic kidney disease: Secondary | ICD-10-CM | POA: Diagnosis not present

## 2020-06-12 DIAGNOSIS — Z992 Dependence on renal dialysis: Secondary | ICD-10-CM | POA: Diagnosis not present

## 2020-06-12 HISTORY — PX: EYE SURGERY: SHX253

## 2020-06-13 DIAGNOSIS — Z992 Dependence on renal dialysis: Secondary | ICD-10-CM | POA: Diagnosis not present

## 2020-06-13 DIAGNOSIS — E1129 Type 2 diabetes mellitus with other diabetic kidney complication: Secondary | ICD-10-CM | POA: Diagnosis not present

## 2020-06-13 DIAGNOSIS — N186 End stage renal disease: Secondary | ICD-10-CM | POA: Diagnosis not present

## 2020-06-13 DIAGNOSIS — D472 Monoclonal gammopathy: Secondary | ICD-10-CM | POA: Diagnosis not present

## 2020-06-13 DIAGNOSIS — N2581 Secondary hyperparathyroidism of renal origin: Secondary | ICD-10-CM | POA: Diagnosis not present

## 2020-06-15 DIAGNOSIS — N2581 Secondary hyperparathyroidism of renal origin: Secondary | ICD-10-CM | POA: Diagnosis not present

## 2020-06-15 DIAGNOSIS — Z992 Dependence on renal dialysis: Secondary | ICD-10-CM | POA: Diagnosis not present

## 2020-06-15 DIAGNOSIS — E1129 Type 2 diabetes mellitus with other diabetic kidney complication: Secondary | ICD-10-CM | POA: Diagnosis not present

## 2020-06-15 DIAGNOSIS — N186 End stage renal disease: Secondary | ICD-10-CM | POA: Diagnosis not present

## 2020-06-15 DIAGNOSIS — D472 Monoclonal gammopathy: Secondary | ICD-10-CM | POA: Diagnosis not present

## 2020-06-17 DIAGNOSIS — N186 End stage renal disease: Secondary | ICD-10-CM | POA: Diagnosis not present

## 2020-06-17 DIAGNOSIS — E1129 Type 2 diabetes mellitus with other diabetic kidney complication: Secondary | ICD-10-CM | POA: Diagnosis not present

## 2020-06-17 DIAGNOSIS — D472 Monoclonal gammopathy: Secondary | ICD-10-CM | POA: Diagnosis not present

## 2020-06-17 DIAGNOSIS — N2581 Secondary hyperparathyroidism of renal origin: Secondary | ICD-10-CM | POA: Diagnosis not present

## 2020-06-17 DIAGNOSIS — Z992 Dependence on renal dialysis: Secondary | ICD-10-CM | POA: Diagnosis not present

## 2020-06-20 DIAGNOSIS — N186 End stage renal disease: Secondary | ICD-10-CM | POA: Diagnosis not present

## 2020-06-20 DIAGNOSIS — D472 Monoclonal gammopathy: Secondary | ICD-10-CM | POA: Diagnosis not present

## 2020-06-20 DIAGNOSIS — N2581 Secondary hyperparathyroidism of renal origin: Secondary | ICD-10-CM | POA: Diagnosis not present

## 2020-06-20 DIAGNOSIS — E1129 Type 2 diabetes mellitus with other diabetic kidney complication: Secondary | ICD-10-CM | POA: Diagnosis not present

## 2020-06-20 DIAGNOSIS — Z992 Dependence on renal dialysis: Secondary | ICD-10-CM | POA: Diagnosis not present

## 2020-06-22 DIAGNOSIS — N2581 Secondary hyperparathyroidism of renal origin: Secondary | ICD-10-CM | POA: Diagnosis not present

## 2020-06-22 DIAGNOSIS — N186 End stage renal disease: Secondary | ICD-10-CM | POA: Diagnosis not present

## 2020-06-22 DIAGNOSIS — Z992 Dependence on renal dialysis: Secondary | ICD-10-CM | POA: Diagnosis not present

## 2020-06-22 DIAGNOSIS — E1129 Type 2 diabetes mellitus with other diabetic kidney complication: Secondary | ICD-10-CM | POA: Diagnosis not present

## 2020-06-22 DIAGNOSIS — D472 Monoclonal gammopathy: Secondary | ICD-10-CM | POA: Diagnosis not present

## 2020-06-23 ENCOUNTER — Encounter: Payer: Self-pay | Admitting: Nurse Practitioner

## 2020-06-23 ENCOUNTER — Ambulatory Visit (INDEPENDENT_AMBULATORY_CARE_PROVIDER_SITE_OTHER): Payer: Medicare Other | Admitting: Nurse Practitioner

## 2020-06-23 ENCOUNTER — Other Ambulatory Visit: Payer: Self-pay

## 2020-06-23 VITALS — BP 159/84 | HR 87 | Temp 97.7°F | Wt 205.0 lb

## 2020-06-23 DIAGNOSIS — Z0000 Encounter for general adult medical examination without abnormal findings: Secondary | ICD-10-CM | POA: Diagnosis not present

## 2020-06-23 NOTE — Progress Notes (Signed)
This service is provided via telemedicine  No vital signs collected/recorded due to the encounter was a telemedicine visit.   Location of patient (ex: home, work):  Home  Patient consents to a telephone visit: Yes, see encounter dated 07/23/2019  Location of the provider (ex: office, home): [place]  Name of any referring provider:  Sherrie Mustache  Names of all persons participating in the telemedicine service and their role in the encounter:  Sherrie Mustache, Nurse Practitioner, [name], CMA, and patient.   Time spent on call:  12 minutes with medical assistant

## 2020-06-23 NOTE — Patient Instructions (Signed)
Henry Carroll , Thank you for taking time to come for your Medicare Wellness Visit. I appreciate your ongoing commitment to your health goals. Please review the following plan we discussed and let me know if I can assist you in the future.   Screening recommendations/referrals: Colonoscopy up to date Recommended yearly ophthalmology/optometry visit for glaucoma screening and checkup Recommended yearly dental visit for hygiene and checkup  Vaccinations: Influenza vaccine up to date Pneumococcal vaccine up to date Tdap vaccine up to date Shingles vaccine up to date    Advanced directives: most form on file   Conditions/risks identified: complications related to DM, CKD, HTN, CAD, advanced age, male, hyperlipidemia  Next appointment: 1 year for AWV   Preventive Care 12 Years and Older, Male Preventive care refers to lifestyle choices and visits with your health care provider that can promote health and wellness. What does preventive care include?  A yearly physical exam. This is also called an annual well check.  Dental exams once or twice a year.  Routine eye exams. Ask your health care provider how often you should have your eyes checked.  Personal lifestyle choices, including:  Daily care of your teeth and gums.  Regular physical activity.  Eating a healthy diet.  Avoiding tobacco and drug use.  Limiting alcohol use.  Practicing safe sex.  Taking low doses of aspirin every day.  Taking vitamin and mineral supplements as recommended by your health care provider. What happens during an annual well check? The services and screenings done by your health care provider during your annual well check will depend on your age, overall health, lifestyle risk factors, and family history of disease. Counseling  Your health care provider may ask you questions about your:  Alcohol use.  Tobacco use.  Drug use.  Emotional well-being.  Home and relationship  well-being.  Sexual activity.  Eating habits.  History of falls.  Memory and ability to understand (cognition).  Work and work Statistician. Screening  You may have the following tests or measurements:  Height, weight, and BMI.  Blood pressure.  Lipid and cholesterol levels. These may be checked every 5 years, or more frequently if you are over 24 years old.  Skin check.  Lung cancer screening. You may have this screening every year starting at age 36 if you have a 30-pack-year history of smoking and currently smoke or have quit within the past 15 years.  Fecal occult blood test (FOBT) of the stool. You may have this test every year starting at age 13.  Flexible sigmoidoscopy or colonoscopy. You may have a sigmoidoscopy every 5 years or a colonoscopy every 10 years starting at age 61.  Prostate cancer screening. Recommendations will vary depending on your family history and other risks.  Hepatitis C blood test.  Hepatitis B blood test.  Sexually transmitted disease (STD) testing.  Diabetes screening. This is done by checking your blood sugar (glucose) after you have not eaten for a while (fasting). You may have this done every 1-3 years.  Abdominal aortic aneurysm (AAA) screening. You may need this if you are a current or former smoker.  Osteoporosis. You may be screened starting at age 53 if you are at high risk. Talk with your health care provider about your test results, treatment options, and if necessary, the need for more tests. Vaccines  Your health care provider may recommend certain vaccines, such as:  Influenza vaccine. This is recommended every year.  Tetanus, diphtheria, and acellular pertussis (Tdap, Td)  vaccine. You may need a Td booster every 10 years.  Zoster vaccine. You may need this after age 39.  Pneumococcal 13-valent conjugate (PCV13) vaccine. One dose is recommended after age 24.  Pneumococcal polysaccharide (PPSV23) vaccine. One dose is  recommended after age 72. Talk to your health care provider about which screenings and vaccines you need and how often you need them. This information is not intended to replace advice given to you by your health care provider. Make sure you discuss any questions you have with your health care provider. Document Released: 02/25/2015 Document Revised: 10/19/2015 Document Reviewed: 11/30/2014 Elsevier Interactive Patient Education  2017 [employer] Prevention in the Home Falls can cause injuries. They can happen to people of all ages. There are many things you can do to make your home safe and to help prevent falls. What can I do on the outside of my home?  Regularly fix the edges of walkways and driveways and fix any cracks.  Remove anything that might make you trip as you walk through a door, such as a raised step or threshold.  Trim any bushes or trees on the path to your home.  Use bright outdoor lighting.  Clear any walking paths of anything that might make someone trip, such as rocks or tools.  Regularly check to see if handrails are loose or broken. Make sure that both sides of any steps have handrails.  Any raised decks and porches should have guardrails on the edges.  Have any leaves, snow, or ice cleared regularly.  Use sand or salt on walking paths during winter.  Clean up any spills in your garage right away. This includes oil or grease spills. What can I do in the bathroom?  Use night lights.  Install grab bars by the toilet and in the tub and shower. Do not use towel bars as grab bars.  Use non-skid mats or decals in the tub or shower.  If you need to sit down in the shower, use a plastic, non-slip stool.  Keep the floor dry. Clean up any water that spills on the floor as soon as it happens.  Remove soap buildup in the tub or shower regularly.  Attach bath mats securely with double-sided non-slip rug tape.  Do not have throw rugs and other things on  the floor that can make you trip. What can I do in the bedroom?  Use night lights.  Make sure that you have a light by your bed that is easy to reach.  Do not use any sheets or blankets that are too big for your bed. They should not hang down onto the floor.  Have a firm chair that has side arms. You can use this for support while you get dressed.  Do not have throw rugs and other things on the floor that can make you trip. What can I do in the kitchen?  Clean up any spills right away.  Avoid walking on wet floors.  Keep items that you use a lot in easy-to-reach places.  If you need to reach something above you, use a strong step stool that has a grab bar.  Keep electrical cords out of the way.  Do not use floor polish or wax that makes floors slippery. If you must use wax, use non-skid floor wax.  Do not have throw rugs and other things on the floor that can make you trip. What can I do with my stairs?  Do not leave  any items on the stairs.  Make sure that there are handrails on both sides of the stairs and use them. Fix handrails that are broken or loose. Make sure that handrails are as long as the stairways.  Check any carpeting to make sure that it is firmly attached to the stairs. Fix any carpet that is loose or worn.  Avoid having throw rugs at the top or bottom of the stairs. If you do have throw rugs, attach them to the floor with carpet tape.  Make sure that you have a light switch at the top of the stairs and the bottom of the stairs. If you do not have them, ask someone to add them for you. What else can I do to help prevent falls?  Wear shoes that:  Do not have high heels.  Have rubber bottoms.  Are comfortable and fit you well.  Are closed at the toe. Do not wear sandals.  If you use a stepladder:  Make sure that it is fully opened. Do not climb a closed stepladder.  Make sure that both sides of the stepladder are locked into place.  Ask someone to  hold it for you, if possible.  Clearly mark and make sure that you can see:  Any grab bars or handrails.  First and last steps.  Where the edge of each step is.  Use tools that help you move around (mobility aids) if they are needed. These include:  Canes.  Walkers.  Scooters.  Crutches.  Turn on the lights when you go into a dark area. Replace any light bulbs as soon as they burn out.  Set up your furniture so you have a clear path. Avoid moving your furniture around.  If any of your floors are uneven, fix them.  If there are any pets around you, be aware of where they are.  Review your medicines with your doctor. Some medicines can make you feel dizzy. This can increase your chance of falling. Ask your doctor what other things that you can do to help prevent falls. This information is not intended to replace advice given to you by your health care provider. Make sure you discuss any questions you have with your health care provider. Document Released: 11/25/2008 Document Revised: 07/07/2015 Document Reviewed: 03/05/2014 Elsevier Interactive Patient Education  2017 [employer].

## 2020-06-23 NOTE — Progress Notes (Signed)
Subjective:   Henry Carroll is a 70 y.o. male who presents for Medicare Annual/Subsequent preventive examination.  Review of Systems     Cardiac Risk Factors include: advanced age (>59mn, >>45women);diabetes mellitus;dyslipidemia;hypertension;male gender;family history of premature cardiovascular disease     Objective:    Today's Vitals   06/23/20 1021  BP: (!) 159/84  Pulse: 87  Temp: 97.7 F (36.5 C)  Weight: 205 lb (93 kg)   Body mass index is 26.32 kg/m.  Advanced Directives 06/23/2020 06/03/2020 04/13/2020 04/04/2020 11/23/2019 06/23/2019 05/25/2019  Does Patient Have a Medical Advance Directive? _0  No No  Type of Advance Directive Out of facility DNR (pink MOST or yellow form) Out of facility DNR (pink MOST or yellow form) Living will;Healthcare Power of Attorney Out of facility DNR (pink MOST or yellow form) - - -  Does patient want to make changes to medical advance directive? No - Patient declined No - Patient declined No - Patient declined No - Patient declined - - No - Patient declined  [place]? - - - - - - -  Would patient like information on creating a medical advance directive? - - - - - No - Patient declined No - Patient declined  Pre-existing out of facility DNR order (yellow form or pink MOST form) Pink MOST form placed in chart (order not valid for inpatient use) Pink MOST form placed in chart (order not valid for inpatient use) - Pink MOST form placed in chart (order not valid for inpatient use) - - -    Current Medications (verified) Outpatient Encounter Medications as of 06/23/2020  Medication Sig  . acetaminophen (TYLENOL) 500 MG tablet Take 1,000 mg by mouth as needed.  .Marland KitchenADVAIR DISKUS 100-50 MCG/DOSE AEPB SMARTSIG:1 Puff(s) Via Inhaler Twice Daily PRN  . amLODipine (NORVASC) 10 MG tablet Take 1 tablet by mouth once daily  . aspirin EC 81 MG tablet Take 1 tablet (81 mg total) by mouth daily.  .Marland Kitchen atorvastatin (LIPITOR) 40 MG tablet TAKE 1 TABLET BY MOUTH ONCE DAILY IN THE EVENING  . carvedilol (COREG) 12.5 MG tablet Take 12.5 mg by mouth daily.   . Cholecalciferol (VITAMIN D3) 25 MCG (1000 UT) CAPS Take 2 capsules by mouth daily.  . Cinnamon 500 MG capsule Take 1,000 mg by mouth daily.   . Coenzyme Q10 10 MG capsule Take 10 mg by mouth daily.  .Marland Kitchenepoetin alfa-epbx (RETACRIT) 199357UNIT/ML injection 20,000 Units every 14 (fourteen) days.   .Marland Kitchenezetimibe (ZETIA) 10 MG tablet Take 1 tablet (10 mg total) by mouth daily.  . fluticasone (FLONASE) 50 MCG/ACT nasal spray Place 2 sprays into both nostrils daily as needed for allergies or rhinitis (SEASONAL ALLERGIES).  . furosemide (LASIX) 40 MG tablet Take 40 mg by mouth. On S-M-W-F Takes  1 tablet by mouth once d  . glucose blood (ONE TOUCH ULTRA TEST) test strip Use to test blood sugar three times daily. Dx: E11.21  . insulin detemir (LEVEMIR) 100 UNIT/ML injection Inject 0.18 mLs (18 Units total) into the skin daily. In the morning  . isosorbide dinitrate (ISORDIL) 10 MG tablet Take by mouth.  . montelukast (SINGULAIR) 10 MG tablet TAKE 1 TABLET BY MOUTH AT BEDTIME  . Multiple Vitamin (MULTIVITAMIN WITH MINERALS) TABS tablet Take 1 tablet by mouth daily.  . nitroGLYCERIN (NITROSTAT) 0.4 MG SL tablet Place 1 tablet (0.4 mg total) under the tongue every 5 (five) minutes  as needed for chest pain.  Marland Kitchen PROAIR HFA 108 (90 Base) MCG/ACT inhaler INHALE 2 PUFFS BY MOUTH EVERY 6 HOURS AS NEEDED FOR SHORTNESS OF BREATH  . vitamin E 45 MG (100 UNITS) capsule Take by mouth.  . [DISCONTINUED] isosorbide mononitrate (IMDUR) 30 MG 24 hr tablet TAKE 1 TABLET BY MOUTH ONCE DAILY -- PATIENT NEEDS APPOINTMENT FOR FUTURE REFILLS  . [DISCONTINUED] CINNAMON PO Take by mouth.  . [DISCONTINUED] Coenzyme Q10 10 MG capsule Take by mouth.  . [DISCONTINUED] ezetimibe (ZETIA) 10 MG tablet Take by mouth.  . [DISCONTINUED] Methoxy PEG-Epoetin Beta (MIRCERA IJ) Mircera  .  [DISCONTINUED] sevelamer carbonate (RENVELA) 800 MG tablet Take 800 mg by mouth 3 (three) times daily with meals.   Facility-Administered Encounter Medications as of 06/23/2020  Medication  . regadenoson (LEXISCAN) injection SOLN 0.4 mg    Allergies (verified) Patient has no known allergies.   History: Past Medical History:  Diagnosis Date  . Abnormal stress test 02/10/2015  . Acute on chronic systolic and diastolic heart failure, NYHA class 1 ([place]) 12/31/2014  . Anemia   . Anemia in chronic kidney disease 09/03/2012  . 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 Asthma   . Asthma, chronic 06/04/2012  . Asthma, chronic, mild intermittent, with acute exacerbation 12/22/2015  . Bilateral lower extremity edema 12/22/2015  . CAD (coronary artery disease) 02/18/2015  . Chronic combined systolic (congestive) and diastolic (congestive) heart failure ([place])    a. 12/31/14: 2D ECHO: EF 40-45%, HK of inf myocardium, G1DD, mod MR  . Chronic combined systolic and diastolic CHF (congestive heart failure) ([place]) 02/10/2015  . Chronic systolic heart failure ([place]) 02/25/2015  . CKD (chronic kidney disease), stage III (HCC)    stage 3 kidney disease  . CKD (chronic kidney disease), stage V ([place]) 08/30/2018  . COLONIC POLYPS, HX OF 04/13/2009   Qualifier: Diagnosis of  By: West[place] Pam.   . Coronary artery disease    a. LHC 01/2015 - triple vessel CAD (mod oLM, mLAD, severe mRCA, intermediate branch stenosis, CTO of mCx). Plan CABG 02/2015.  Marland Kitchen Demand ischemia ([place]) 12/31/2014  . DM type 2, uncontrolled, with renal complications ([place]) 7/37/1062  . Dyspnea     due to fluid  . Elevated troponin   . Essential hypertension 10/09/2013  . Folliculitis of perineum 10/01/2012  . Gastric erosion   . GERD (gastroesophageal reflux disease)   . History of blood transfusion   . Hyperlipidemia   . Hyperlipidemia with target LDL less than 100 12/31/2014  . Hypertension   . Hypertensive heart disease with heart failure  ([place]) 02/25/2015  . IDA (iron deficiency anemia) 01/23/2016  . Melena 01/23/2016  . 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 Mitral regurgitation    a. Mild-mod by echo 12/2014.  . Morbid obesity ([place]) 12/29/2014  . Myocardial infarction [facility])    pt. states per [name] he has in the past  . NSVT (nonsustained ventricular tachycardia) ([place])    a. 9 beats during 01/2015 adm. BB titrated.  . Obesity    a. BMI 33  . Obesity (BMI 30-39.9) 05/05/2013  . OSA (obstructive sleep apnea) 01/16/2018   Mild obstructive sleep apnea overall with an AHI of 7.3/h and no significant central sleep apnea. Severe obstructive sleep apnea during REM sleep with an AHI of 34.3/h.  Now on CPAP at 6 cm H2O.   . Other malaise and fatigue 09/03/2012  . 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  PAD (peripheral artery disease) ([place]) 03/02/2015  . Pneumonia   . Sleep apnea    2019, Dr.Turner diagnoised   . Small bowel lesion   . Symptomatic anemia 08/29/2018  . 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 Type II diabetes mellitus ([place])   . Vitreous hemorrhage of right eye ([place]) 05/26/2019  . Wears dentures    Past Surgical History:  Procedure Laterality Date  . BACK SURGERY    . BASCILIC VEIN TRANSPOSITION Left 11/14/2018   Procedure: FIRST STAGE BASILIC VEIN TRANSPOSITION LEFT ARM;  Surgeon: Serafina Mitchell, MD;  Location: [place];  Service: Vascular;  Laterality: Left;  . BASCILIC VEIN TRANSPOSITION Left 01/28/2019   Procedure: BASILIC VEIN TRANSPOSITION SECOND STAGE;  Surgeon: Serafina Mitchell, MD;  Location: [place];  Service: Vascular;  Laterality: Left;  . BONE MARROW BIOPSY    . CARDIAC CATHETERIZATION N/A 02/09/2015   Procedure: Left Heart Cath and Coronary Angiography;  Surgeon: Burnell Blanks, MD;  Location: [place] [facility];  Service: Cardiovascular;  Laterality: N/A;  . CATARACT EXTRACTION  06/11/2020  . COLONOSCOPY  2011   Dr. [name]: Ileocecal valve appeared normal,  scattered pancolonic diverticulosis, difficult bowel prep making smaller lesions potentially missed. Recommended three-year follow-up colonoscopy.  . COLONOSCOPY  2003   Dr. [name]: Suspicious lesion at the ileocecal valve, multiple biopsies benign, pancolonic diverticulosis.  . COLONOSCOPY N/A 02/15/2016   diverticulosis in sigmoid and descending colon, single 5 mm polyp at splenic flexure (Tubular adenoma)  . CORONARY ARTERY BYPASS GRAFT N/A 02/18/2015   Procedure: CORONARY ARTERY BYPASS GRAFTING (CABG) x  four, using bilateral internal mammary arteries and right leg greater saphenous vein harvested endoscopically;  Surgeon: [name], MD;  Location: [facility];  Service: Open Heart Surgery;  Laterality: N/A;  . ESOPHAGOGASTRODUODENOSCOPY N/A 02/15/2016   normal  . EYE SURGERY Right    July 2019   . GIVENS CAPSULE STUDY N/A 01/08/2017   couple of gastric and small bowel eroions in setting of aspirin 81 mg daily but nothing concerning, continue Hematology follow-up  . HERNIA REPAIR  [id number]   Umbilical  . LUMBAR [place] SURGERY  2006   L4 & L5  . REFRACTIVE SURGERY Left    2019   . REFRACTIVE SURGERY Right    2019  . TEE WITHOUT CARDIOVERSION N/A 02/18/2015   Procedure: TRANSESOPHAGEAL ECHOCARDIOGRAM (TEE);  Surgeon: [name], MD;  Location: [facility];  Service: Open Heart Surgery;  Laterality: N/A;  . TONSILLECTOMY  1962   Family History  Problem Relation Age of Onset  . Diabetes Mother   . Heart disease Mother        later in life, age >77  . Heart disease Father        heart failure later in life  . Hypertension Father   . Stroke Sister   . Kidney disease Daughter   . Sudden death Daughter   . Colon cancer Paternal Uncle        Passed from colon CA  . Heart attack Neg Hx    Social History   Socioeconomic History  . Marital status: Married    Spouse name: Not on file  . Number of children: Not on file  . Years of education: Not on file  . Highest education level: Not on file   Occupational History  . Not on file  Tobacco Use  . Smoking status: Former Smoker    Years: 17.00    Types: Cigars  Quit date: 12/31/2014    Years since quitting: 5.4  . Smokeless tobacco: Never Used  Vaping Use  . Vaping Use: Never used  Substance and Sexual Activity  . Alcohol use: Yes    Alcohol/week: 3.0 standard drinks    Types: 3 Glasses of wine per week    Comment: occasional glass of wine.  . Drug use: No  . Sexual activity: Yes    Birth control/protection: None  Other Topics Concern  . Not on file  Social History Narrative  . Not on file   Social Determinants of Health   Financial Resource Strain: Not on file  Food Insecurity: Not on file  Transportation Needs: Not on file  Physical Activity: Not on file  Stress: Not on file  Social Connections: Not on file    Tobacco Counseling Counseling given: Not Answered   Clinical Intake:  Pre-visit preparation completed: Yes  Pain : No/denies pain     BMI - recorded: 26 Nutritional Status: BMI 25 -29 Overweight Nutritional Risks: None Diabetes: Yes  How often do you need to have someone help you when you read instructions, pamphlets, or other written materials from your doctor or pharmacy?: 1 - Never Diabetic?yes         Activities of Daily Living In your present state of health, do you have any difficulty performing the following activities: 06/23/2020  Hearing? N  Vision? Y  Difficulty concentrating or making decisions? N  Walking or climbing stairs? N  Dressing or bathing? N  Doing errands, shopping? N  Preparing Food and eating ? N  Using the Toilet? N  In the past six months, have you accidently leaked urine? N  Do you have problems with loss of bowel control? N  Managing your Medications? N  Managing your Finances? N  Housekeeping or managing your Housekeeping? N  Some recent data might be hidden    Patient Care Team: [name], NP as PCP - General (Geriatric  Medicine) Jerline Pain, MD as PCP - Cardiology (Cardiology) Daneil Dolin, MD as Consulting Physician (Gastroenterology) Zadie Rhine Clent Demark, MD as Consulting Physician (Ophthalmology) Elmarie Shiley, MD as Consulting Physician (Nephrology) Derek Jack, MD as Consulting Physician (Hematology)  Indicate any recent Medical Services you may have received from other than [facility] providers in the past year (date may be approximate).     Assessment:   This is a routine wellness examination for [place].  Hearing/Vision screen  Hearing Screening   _0  _1  _2  _3  _4  _5  _6  _7  _8   Right ear:           Left ear:           Comments: Patient has no hearing problems.  Vision Screening Comments: Patient has vision problems. Patient sees Dr. Gershon Crane for regular eye exams and sees Dr. Zadie Rhine for retina  Dietary issues and exercise activities discussed: Current Exercise Habits: Home exercise routine, Type of exercise: treadmill;strength training/weights, Time (Minutes): 20, Frequency (Times/Week): 7, Weekly Exercise (Minutes/Week): 140  Goals Addressed            This Visit's Progress   . Increase physical activity       To get back into exercising routinely       Depression Screen PHQ 2/9 Scores 06/23/2020 04/04/2020 07/23/2019 06/23/2019 01/07/2019 06/20/2018 05/29/2018  PHQ - 2 Score 0 0 0 0 0 0 0  PHQ- 9 Score - - - - - - -  Exception Documentation - - - - - - -  Fall Risk Fall Risk  06/23/2020 06/03/2020 04/04/2020 07/23/2019 06/23/2019  Falls in the past year? 0 0 0 1 1  Number falls in past yr: 0 0 0 0 0  Injury with Fall? 0 0 0 0 0  Risk for fall due to : - - - History of fall(s) -  Risk for fall due to: Comment - - - - -    FALL RISK PREVENTION PERTAINING TO THE HOME:  Any stairs in or around the home? Yes  If so, are there any without handrails? No  Home free of loose throw rugs in walkways, pet beds, electrical cords, etc? Yes  Adequate lighting  in your home to reduce risk of falls? Yes   ASSISTIVE DEVICES UTILIZED TO PREVENT FALLS:  Life alert? No  Use of a cane, walker or w/c? No  Grab bars in the bathroom? Yes  Shower chair or bench in shower? Yes  Elevated toilet seat or a handicapped toilet? No   TIMED UP AND GO:  Was the test performed? No .    Cognitive Function: MMSE - Mini Mental State Exam 01/09/2017 04/06/2016 12/21/2015 12/01/2014 05/05/2013  Not completed: - (No Data) - - -  Orientation to time 5 - _0 Orientation to Place 5 - _1 Registration 3 - _2 Attention/ Calculation 5 - _3 Recall 3 - _4 Language- name 2 objects 2 - _5 Language- repeat 1 - _6 Language- follow 3 step command 3 - _7 Language- read & follow direction 1 - _8 Write a sentence 1 - _9 Copy design 1 - _10 Total score 30 - _11 6CIT Screen 06/23/2020 06/23/2019 06/20/2018  What Year? 0 points 0 points 0 points  What month? 0 points 0 points 0 points  What time? 0 points 0 points 0 points  Count back from 20 0 points 0 points 0 points  Months in reverse 0 points 0 points 0 points  Repeat phrase 0 points 0 points 0 points  Total Score 0 0 0    Immunizations Immunization History  Administered Date(s) Administered  . Hepb-cpg 09/10/2019, 10/01/2019, 10/29/2019, 12/31/2019  . Influenza, High Dose Seasonal PF 11/26/2017, 11/07/2019  . Influenza,inj,Quad PF,6+ Mos 10/15/2012, 01/05/2014, 12/01/2014, 12/05/2015, 12/11/2016  . Influenza-Unspecified 02/04/2012  . Moderna Sars-Covid-2 Vaccination 03/22/2019, 04/22/2019, 11/26/2019  . Pneumococcal Conjugate-13 02/09/2014  . Pneumococcal Polysaccharide-23 05/05/2013  . Tdap 03/26/2011  . Zoster Recombinat (Shingrix) 01/25/2017, 03/15/2017    TDAP status: Up to date  Flu Vaccine status: Up to date  Pneumococcal vaccine status: Up to date  Covid-19 vaccine status: Completed vaccines  Qualifies for Shingles Vaccine? Yes   Zostavax completed  Yes   Shingrix Completed?: Yes  Screening Tests Health Maintenance  Topic Date Due  . Hepatitis C Screening  Never done  . COVID-19 Vaccine (4 - Booster for Moderna series) 02/26/2020  . HEMOGLOBIN A1C  08/31/2020  . INFLUENZA VACCINE  09/12/2020  . TETANUS/TDAP  03/25/2021  . FOOT EXAM  04/04/2021  . OPHTHALMOLOGY EXAM  04/07/2021  . COLONOSCOPY (Pts 45-89yr Insurance coverage will need to be confirmed)  02/14/2026  . PNA vac Low Risk Adult  Completed  . HPV VACCINES  Aged Out    Health Maintenance  Health Maintenance Due  Topic Date Due  . Hepatitis  C Screening  Never done  . COVID-19 Vaccine (4 - Booster for Moderna series) 02/26/2020    Colorectal cancer screening: Type of screening: Colonoscopy. Completed 2018. Repeat every 10 years  Lung Cancer Screening: (Low Dose CT Chest recommended if Age 72-80 years, 30 pack-year currently smoking OR have quit w/in 15years.) does not qualify.   Lung Cancer Screening Referral: na  Additional Screening:  Hepatitis C Screening: does qualify; needs to be completed   Vision Screening: Recommended annual ophthalmology exams for early detection of glaucoma and other disorders of the eye. Is the patient up to date with their annual eye exam?  Yes  Who is the provider or what is the name of the office in which the patient attends annual eye exams? [facility] If pt is not established with a provider, would they like to be referred to a provider to establish care? No .   Dental Screening: Recommended annual dental exams for proper oral hygiene  Community Resource Referral / Chronic Care Management: CRR required this visit?  No   CCM required this visit?  No      Plan:     I have personally reviewed and noted the following in the patient's chart:   . Medical and social history . Use of alcohol, tobacco or illicit drugs  . Current medications and supplements including opioid prescriptions. Patient is not currently  taking opioid prescriptions. . Functional ability and status . Nutritional status . Physical activity . Advanced directives . List of other physicians . Hospitalizations, surgeries, and ER visits in previous 12 months . Vitals . Screenings to include cognitive, depression, and falls . Referrals and appointments  In addition, I have reviewed and discussed with patient certain preventive protocols, quality metrics, and best practice recommendations. A written personalized care plan for preventive services as well as general preventive health recommendations were provided to patient.     [name], NP   06/23/2020    Virtual Visit via Telephone Note  I connected with@ on 06/23/20 at 10:00 AM EDT by telephone and verified that I am speaking with the correct person using two identifiers.  Location: Patient: home Provider: [facility]   I discussed the limitations, risks, security and privacy concerns of performing an evaluation and management service by telephone and the availability of in person appointments. I also discussed with the patient that there may be a patient responsible charge related to this service. The patient expressed understanding and agreed to proceed.   I discussed the assessment and treatment plan with the patient. The patient was provided an opportunity to ask questions and all were answered. The patient agreed with the plan and demonstrated an understanding of the instructions.   The patient was advised to call back or seek an in-person evaluation if the symptoms worsen or if the condition fails to improve as anticipated.  I provided 15 minutes of non-face-to-face time during this encounter.  Carlos American. Harle Battiest Avs printed and mailed

## 2020-06-24 DIAGNOSIS — D472 Monoclonal gammopathy: Secondary | ICD-10-CM | POA: Diagnosis not present

## 2020-06-24 DIAGNOSIS — N186 End stage renal disease: Secondary | ICD-10-CM | POA: Diagnosis not present

## 2020-06-24 DIAGNOSIS — Z992 Dependence on renal dialysis: Secondary | ICD-10-CM | POA: Diagnosis not present

## 2020-06-24 DIAGNOSIS — E1129 Type 2 diabetes mellitus with other diabetic kidney complication: Secondary | ICD-10-CM | POA: Diagnosis not present

## 2020-06-24 DIAGNOSIS — N2581 Secondary hyperparathyroidism of renal origin: Secondary | ICD-10-CM | POA: Diagnosis not present

## 2020-06-25 ENCOUNTER — Other Ambulatory Visit: Payer: Self-pay | Admitting: Nurse Practitioner

## 2020-06-25 DIAGNOSIS — IMO0002 Reserved for concepts with insufficient information to code with codable children: Secondary | ICD-10-CM

## 2020-06-25 DIAGNOSIS — E1129 Type 2 diabetes mellitus with other diabetic kidney complication: Secondary | ICD-10-CM

## 2020-06-27 DIAGNOSIS — E1129 Type 2 diabetes mellitus with other diabetic kidney complication: Secondary | ICD-10-CM | POA: Diagnosis not present

## 2020-06-27 DIAGNOSIS — D472 Monoclonal gammopathy: Secondary | ICD-10-CM | POA: Diagnosis not present

## 2020-06-27 DIAGNOSIS — Z992 Dependence on renal dialysis: Secondary | ICD-10-CM | POA: Diagnosis not present

## 2020-06-27 DIAGNOSIS — N2581 Secondary hyperparathyroidism of renal origin: Secondary | ICD-10-CM | POA: Diagnosis not present

## 2020-06-27 DIAGNOSIS — N186 End stage renal disease: Secondary | ICD-10-CM | POA: Diagnosis not present

## 2020-06-29 DIAGNOSIS — N186 End stage renal disease: Secondary | ICD-10-CM | POA: Diagnosis not present

## 2020-06-29 DIAGNOSIS — D472 Monoclonal gammopathy: Secondary | ICD-10-CM | POA: Diagnosis not present

## 2020-06-29 DIAGNOSIS — N2581 Secondary hyperparathyroidism of renal origin: Secondary | ICD-10-CM | POA: Diagnosis not present

## 2020-06-29 DIAGNOSIS — Z992 Dependence on renal dialysis: Secondary | ICD-10-CM | POA: Diagnosis not present

## 2020-06-29 DIAGNOSIS — E1129 Type 2 diabetes mellitus with other diabetic kidney complication: Secondary | ICD-10-CM | POA: Diagnosis not present

## 2020-07-01 DIAGNOSIS — N186 End stage renal disease: Secondary | ICD-10-CM | POA: Diagnosis not present

## 2020-07-01 DIAGNOSIS — E1129 Type 2 diabetes mellitus with other diabetic kidney complication: Secondary | ICD-10-CM | POA: Diagnosis not present

## 2020-07-01 DIAGNOSIS — Z992 Dependence on renal dialysis: Secondary | ICD-10-CM | POA: Diagnosis not present

## 2020-07-01 DIAGNOSIS — D472 Monoclonal gammopathy: Secondary | ICD-10-CM | POA: Diagnosis not present

## 2020-07-01 DIAGNOSIS — N2581 Secondary hyperparathyroidism of renal origin: Secondary | ICD-10-CM | POA: Diagnosis not present

## 2020-07-04 ENCOUNTER — Ambulatory Visit (INDEPENDENT_AMBULATORY_CARE_PROVIDER_SITE_OTHER): Payer: Medicare Other | Admitting: Ophthalmology

## 2020-07-04 ENCOUNTER — Other Ambulatory Visit: Payer: Self-pay

## 2020-07-04 ENCOUNTER — Encounter (INDEPENDENT_AMBULATORY_CARE_PROVIDER_SITE_OTHER): Payer: Self-pay | Admitting: Ophthalmology

## 2020-07-04 DIAGNOSIS — Z992 Dependence on renal dialysis: Secondary | ICD-10-CM | POA: Diagnosis not present

## 2020-07-04 DIAGNOSIS — H4312 Vitreous hemorrhage, left eye: Secondary | ICD-10-CM | POA: Diagnosis not present

## 2020-07-04 DIAGNOSIS — E113512 Type 2 diabetes mellitus with proliferative diabetic retinopathy with macular edema, left eye: Secondary | ICD-10-CM

## 2020-07-04 DIAGNOSIS — E1129 Type 2 diabetes mellitus with other diabetic kidney complication: Secondary | ICD-10-CM | POA: Diagnosis not present

## 2020-07-04 DIAGNOSIS — Z961 Presence of intraocular lens: Secondary | ICD-10-CM

## 2020-07-04 DIAGNOSIS — N186 End stage renal disease: Secondary | ICD-10-CM | POA: Diagnosis not present

## 2020-07-04 DIAGNOSIS — N2581 Secondary hyperparathyroidism of renal origin: Secondary | ICD-10-CM | POA: Diagnosis not present

## 2020-07-04 DIAGNOSIS — D472 Monoclonal gammopathy: Secondary | ICD-10-CM | POA: Diagnosis not present

## 2020-07-04 NOTE — Progress Notes (Signed)
07/04/2020     CHIEF COMPLAINT Patient presents for Retina Evaluation (Clumps of hair floating in front of the left eye hampering vision Per patient after cataract surgery left eye n)   HISTORY OF PRESENT ILLNESS: Henry Carroll is a 70 y.o. male who presents to the clinic today for:   HPI    Retina Evaluation    Laterality: left eye   Onset: months ago   Associated Symptoms: Floaters   Context: mid-range vision, near vision and reading   Response to treatment: mild improvement   MD Performed: performed the HPI with the patient and updated documentation appropriately   Comments: Clumps of hair floating in front of the left eye hampering vision Per patient after cataract surgery left eye n       Last edited by Edmon Crape, MD on 07/04/2020  2:53 PM. (History)      Referring physician: Sharon Seller, NP [address] [place],  [place] [address]  HISTORICAL INFORMATION:   Selected notes from the MEDICAL RECORD NUMBER    Lab Results  Component Value Date   HGBA1C 7.0 03/03/2020     CURRENT MEDICATIONS: No current outpatient medications on file. (Ophthalmic Drugs)   No current facility-administered medications for this visit. (Ophthalmic Drugs)   Current Outpatient Medications (Other)  Medication Sig  . acetaminophen (TYLENOL) 500 MG tablet Take 1,000 mg by mouth as needed.  Marland Kitchen ADVAIR DISKUS 100-50 MCG/DOSE AEPB SMARTSIG:1 Puff(s) Via Inhaler Twice Daily PRN  . amLODipine (NORVASC) 10 MG tablet Take 1 tablet by mouth once daily  . aspirin EC 81 MG tablet Take 1 tablet (81 mg total) by mouth daily.  Marland Kitchen atorvastatin (LIPITOR) 40 MG tablet TAKE 1 TABLET BY MOUTH ONCE DAILY IN THE EVENING  . carvedilol (COREG) 12.5 MG tablet Take 12.5 mg by mouth daily.   . Cholecalciferol (VITAMIN D3) 25 MCG (1000 UT) CAPS Take 2 capsules by mouth daily.  . Cinnamon 500 MG capsule Take 1,000 mg by mouth daily.   . Coenzyme Q10 10 MG capsule Take 10 mg by mouth daily.  Marland Kitchen  epoetin alfa-epbx (RETACRIT) 87215 UNIT/ML injection 20,000 Units every 14 (fourteen) days.   Marland Kitchen ezetimibe (ZETIA) 10 MG tablet Take 1 tablet (10 mg total) by mouth daily.  . fluticasone (FLONASE) 50 MCG/ACT nasal spray Place 2 sprays into both nostrils daily as needed for allergies or rhinitis (SEASONAL ALLERGIES).  . furosemide (LASIX) 40 MG tablet Take 40 mg by mouth. On S-M-W-F Takes  1 tablet by mouth once d  . glucose blood (ONE TOUCH ULTRA TEST) test strip Use to test blood sugar three times daily. Dx: E11.21  . isosorbide dinitrate (ISORDIL) 10 MG tablet Take by mouth.  Marland Kitchen LEVEMIR 100 UNIT/ML injection INJECT 18 UNITS SUBCUTANEOUSLY ONCE DAILY IN THE MORNING  . montelukast (SINGULAIR) 10 MG tablet TAKE 1 TABLET BY MOUTH AT BEDTIME  . Multiple Vitamin (MULTIVITAMIN WITH MINERALS) TABS tablet Take 1 tablet by mouth daily.  . nitroGLYCERIN (NITROSTAT) 0.4 MG SL tablet Place 1 tablet (0.4 mg total) under the tongue every 5 (five) minutes as needed for chest pain.  Marland Kitchen PROAIR HFA 108 (90 Base) MCG/ACT inhaler INHALE 2 PUFFS BY MOUTH EVERY 6 HOURS AS NEEDED FOR SHORTNESS OF BREATH  . vitamin E 45 MG (100 UNITS) capsule Take by mouth.   No current facility-administered medications for this visit. (Other)   Facility-Administered Medications Ordered in Other Visits (Other)  Medication Route  . regadenoson (LEXISCAN)  injection SOLN 0.4 mg Intravenous      REVIEW OF SYSTEMS:    ALLERGIES No Known Allergies  PAST MEDICAL HISTORY Past Medical History:  Diagnosis Date  . Abnormal stress test 02/10/2015  . Acute on chronic systolic and diastolic heart failure, NYHA class 1 ([place]) 12/31/2014  . Anemia   . Anemia in chronic kidney disease 09/03/2012  . 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 Asthma   . Asthma, chronic 06/04/2012  . Asthma, chronic, mild intermittent, with acute exacerbation 12/22/2015  . Bilateral lower extremity edema 12/22/2015  . CAD (coronary artery disease) 02/18/2015  . Chronic  combined systolic (congestive) and diastolic (congestive) heart failure ([place])    a. 12/31/14: 2D ECHO: EF 40-45%, HK of inf myocardium, G1DD, mod MR  . Chronic combined systolic and diastolic CHF (congestive heart failure) ([place]) 02/10/2015  . Chronic systolic heart failure ([place]) 02/25/2015  . CKD (chronic kidney disease), stage III (HCC)    stage 3 kidney disease  . CKD (chronic kidney disease), stage V ([place]) 08/30/2018  . COLONIC POLYPS, HX OF 04/13/2009   Qualifier: Diagnosis of  By: Westly Pam.   . Coronary artery disease    a. LHC 01/2015 - triple vessel CAD (mod oLM, mLAD, severe mRCA, intermediate branch stenosis, CTO of mCx). Plan CABG 02/2015.  Marland Kitchen Demand ischemia ([place]) 12/31/2014  . DM type 2, uncontrolled, with renal complications ([place]) 0/71/2197  . Dyspnea     due to fluid  . Elevated troponin   . Essential hypertension 10/09/2013  . Folliculitis of perineum 10/01/2012  . Gastric erosion   . GERD (gastroesophageal reflux disease)   . History of blood transfusion   . Hyperlipidemia   . Hyperlipidemia with target LDL less than 100 12/31/2014  . Hypertension   . Hypertensive heart disease with heart failure ([place]) 02/25/2015  . IDA (iron deficiency anemia) 01/23/2016  . Melena 01/23/2016  . 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 Mitral regurgitation    a. Mild-mod by echo 12/2014.  . Morbid obesity ([place]) 12/29/2014  . Myocardial infarction [facility])    pt. states per [name] he has in the past  . NSVT (nonsustained ventricular tachycardia) ([place])    a. 9 beats during 01/2015 adm. BB titrated.  . Nuclear sclerotic cataract of left eye 05/26/2019  . Nuclear sclerotic cataract of right eye 05/26/2019  . Obesity    a. BMI 33  . Obesity (BMI 30-39.9) 05/05/2013  . OSA (obstructive sleep apnea) 01/16/2018   Mild obstructive sleep apnea overall with an AHI of 7.3/h and no significant central sleep apnea. Severe obstructive sleep apnea during REM sleep  with an AHI of 34.3/h.  Now on CPAP at 6 cm H2O.   . Other malaise and fatigue 09/03/2012  . 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 PAD (peripheral artery disease) ([place]) 03/02/2015  . Pneumonia   . Sleep apnea    2019, Dr.Turner diagnoised   . Small bowel lesion   . Symptomatic anemia 08/29/2018  . 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 Type II diabetes mellitus ([place])   . Vitreous hemorrhage of right eye ([place]) 05/26/2019  . Wears dentures    Past Surgical History:  Procedure Laterality Date  . BACK SURGERY    . BASCILIC VEIN TRANSPOSITION Left 11/14/2018   Procedure: FIRST STAGE BASILIC VEIN TRANSPOSITION LEFT ARM;  Surgeon: Serafina Mitchell, MD;  Location: [facility];  Service: Vascular;  Laterality: Left;  . [place]  TRANSPOSITION Left 01/28/2019   Procedure: BASILIC VEIN TRANSPOSITION SECOND STAGE;  Surgeon: Serafina Mitchell, MD;  Location: [facility];  Service: Vascular;  Laterality: Left;  . BONE MARROW BIOPSY    . CARDIAC CATHETERIZATION N/A 02/09/2015   Procedure: Left Heart Cath and Coronary Angiography;  Surgeon: Burnell Blanks, MD;  Location: [facility];  Service: Cardiovascular;  Laterality: N/A;  . CATARACT EXTRACTION  06/11/2020  . COLONOSCOPY  2011   Dr. [name]: Ileocecal valve appeared normal, scattered pancolonic diverticulosis, difficult bowel prep making smaller lesions potentially missed. Recommended three-year follow-up colonoscopy.  . COLONOSCOPY  2003   Dr. [name]: Suspicious lesion at the ileocecal valve, multiple biopsies benign, pancolonic diverticulosis.  . COLONOSCOPY N/A 02/15/2016   diverticulosis in sigmoid and descending colon, single 5 mm polyp at splenic flexure (Tubular adenoma)  . CORONARY ARTERY BYPASS GRAFT N/A 02/18/2015   Procedure: CORONARY ARTERY BYPASS GRAFTING (CABG) x  four, using bilateral internal mammary arteries and right leg greater saphenous vein harvested endoscopically;  Surgeon: [name], MD;  Location: [facility];   Service: Open Heart Surgery;  Laterality: N/A;  . ESOPHAGOGASTRODUODENOSCOPY N/A 02/15/2016   normal  . EYE SURGERY Right    July 2019   . GIVENS CAPSULE STUDY N/A 01/08/2017   couple of gastric and small bowel eroions in setting of aspirin 81 mg daily but nothing concerning, continue Hematology follow-up  . HERNIA REPAIR  0347   Umbilical  . LUMBAR [place] SURGERY  2006   L4 & L5  . REFRACTIVE SURGERY Left    2019   . REFRACTIVE SURGERY Right    2019  . TEE WITHOUT CARDIOVERSION N/A 02/18/2015   Procedure: TRANSESOPHAGEAL ECHOCARDIOGRAM (TEE);  Surgeon: [name], MD;  Location: [facility];  Service: Open Heart Surgery;  Laterality: N/A;  . TONSILLECTOMY  1962    FAMILY HISTORY Family History  Problem Relation Age of Onset  . Diabetes Mother   . Heart disease Mother        later in life, age >57  . Heart disease Father        heart failure later in life  . Hypertension Father   . Stroke Sister   . Kidney disease Daughter   . Sudden death Daughter   . Colon cancer Paternal Uncle        Passed from colon CA  . Heart attack Neg Hx     SOCIAL HISTORY Social History   Tobacco Use  . Smoking status: Former Smoker    Years: 17.00    Types: Cigars    Quit date: 12/31/2014    Years since quitting: 5.5  . Smokeless tobacco: Never Used  Vaping Use  . Vaping Use: Never used  Substance Use Topics  . Alcohol use: Yes    Alcohol/week: 3.0 standard drinks    Types: 3 Glasses of wine per week    Comment: occasional glass of wine.  . Drug use: No         OPHTHALMIC EXAM:  Base Eye Exam    Visual Acuity (ETDRS)      Right Left   Dist [place] 20/40 20/30       Tonometry (Tonopen, 2:32 PM)      Right Left   Pressure 12 13       Neuro/Psych    Oriented x3: Yes   Mood/Affect: Normal       Dilation    Both eyes: 2.5% Phenylephrine, 1.0% Mydriacyl @ 2:32  PM        Slit Lamp and Fundus Exam    External Exam      Right Left   External Normal Normal       Slit Lamp  Exam      Right Left   Lids/Lashes Normal Normal   Conjunctiva/Sclera White and quiet White and quiet   Cornea Clear Clear   Anterior Chamber Deep and quiet Deep and quiet   Iris Round and reactive Round and reactive   Lens Centered posterior chamber intraocular lens Centered posterior chamber intraocular lens   Anterior Vitreous Normal Normal       Fundus Exam      Right Left   Posterior Vitreous Clear, avitric Vitreous hemorrhage 2+,    Disc Normal Normal   C/D Ratio 0.25 0.4   Macula Macular thickening,  Mild clinically significant macular edema, Exudates, Microaneurysms Microaneurysms, Clinically significant macular edema, Mild clinically significant macular edema   Vessels Quiet proliferative diabetic retinopathy Active proliferative diabetic retinopathy   Periphery Laser scars, good PRP room for PRP INF          IMAGING AND PROCEDURES  Imaging and Procedures for 07/04/20  OCT, Retina - OU - Both Eyes       Right Eye Quality was good. Scan locations included subfoveal. Central Foveal Thickness: 630. Progression has worsened.   Left Eye Quality was good. Scan locations included subfoveal. Central Foveal Thickness: 319. Progression has worsened. Findings include abnormal foveal contour.   Notes OS with diffuse macular edema as well as medial opacity hampering visualization.  OD with clear media yet with massive CSME recurrent.  Will need antivegF in the future       Color Fundus Photography Optos - OU - Both Eyes       Right Eye Progression has been stable. Disc findings include normal observations. Macula : microaneurysms.   Left Eye Progression has been stable. Disc findings include normal observations.   Notes OD, proliferative diabetic retinopathy completely quiescent good PRP 360 clear media  OS,  moderate to dense vitreous hemorrhage, centrally, room inferiorly for PRP OS Will need completion of PRP but most importantly will need to schedule  vitrectomy.                ASSESSMENT/PLAN:  Pseudophakia of both eyes Well centered IOLs, looks great  Vitreous hemorrhage of left eye (HCC) Mild clearance of vitreous hemorrhage left eye, still hampering patient's ability to function and see to function activities of daily living      ICD-10-CM   1. 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 Pseudophakia of both eyes  Z96.1   3. Vitreous hemorrhage of left eye (HCC)  H43.12     1.  Moderate vitreous hemorrhage of left eye, mildly clearing and yet hampering patient's activities of daily living with clumps of material which he can peer through and visualize on on occasion  2.  OS, will recommend vitrectomy and PRP left eye to clear the visual axis to maximize visual functioning now that cataract surgery has been completed and that the anterior hyaloid can be removed at the same time as posterior hyaloid and resection of tractional changes and medial opacities.  3.  Risk and benefits of surgical intervention were reviewed.  Patient ordered to commence with  the topical eyedrops preoperatively as usual and skin  Ophthalmic Meds Ordered this visit:  No orders of the defined types were placed in this encounter.      Return ,, [facility], for Schedule vitrectomy, PRP left eye, OS.  There are no Patient Instructions on file for this visit.   Explained the diagnoses, plan, and follow up with the patient and they expressed understanding.  Patient expressed understanding of the importance of proper follow up care.   Clent Henry Carroll M.D. Diseases & Surgery of the Retina and Vitreous Retina & Diabetic [place] 07/04/20     Abbreviations: M myopia (nearsighted); A astigmatism; H hyperopia (farsighted); P presbyopia; Mrx spectacle prescription;   CTL contact lenses; OD right eye; OS left eye; OU both eyes  XT exotropia; ET esotropia; PEK punctate epithelial keratitis; PEE punctate epithelial erosions; DES dry eye syndrome; MGD meibomian gland dysfunction; ATs artificial tears; PFAT's preservative free artificial tears; [place] nuclear sclerotic cataract; PSC posterior subcapsular cataract; ERM epi-retinal membrane; PVD posterior vitreous detachment; RD retinal detachment; DM diabetes mellitus; DR diabetic retinopathy; NPDR non-proliferative diabetic retinopathy; PDR proliferative diabetic retinopathy; CSME clinically significant macular edema; DME diabetic macular edema; dbh dot blot hemorrhages; CWS cotton wool spot; POAG primary open angle glaucoma; C/D cup-to-disc ratio; HVF humphrey visual field; GVF goldmann visual field; OCT optical coherence tomography; IOP intraocular pressure; BRVO Branch retinal vein occlusion; CRVO central retinal vein occlusion; CRAO central retinal artery occlusion; BRAO branch retinal artery occlusion; RT retinal tear; SB scleral buckle; PPV pars plana vitrectomy; VH Vitreous hemorrhage; PRP panretinal laser photocoagulation; IVK intravitreal kenalog; VMT vitreomacular traction; MH Macular hole;  NVD neovascularization of the disc; NVE neovascularization elsewhere; AREDS age related eye disease study; ARMD age related macular degeneration; POAG primary open angle glaucoma; EBMD epithelial/anterior basement membrane dystrophy; ACIOL anterior chamber intraocular lens; IOL intraocular lens; PCIOL posterior chamber intraocular lens; Phaco/IOL phacoemulsification with intraocular lens placement; [place] photorefractive keratectomy; LASIK laser assisted in situ keratomileusis; HTN hypertension; DM diabetes mellitus; COPD chronic obstructive pulmonary disease

## 2020-07-04 NOTE — Assessment & Plan Note (Signed)
Mild clearance of vitreous hemorrhage left eye, still hampering patient's ability to function and see to function activities of daily living

## 2020-07-04 NOTE — Assessment & Plan Note (Signed)
Well centered IOLs, looks great

## 2020-07-06 ENCOUNTER — Encounter (AMBULATORY_SURGERY_CENTER): Payer: Medicare Other | Admitting: Ophthalmology

## 2020-07-06 DIAGNOSIS — N2581 Secondary hyperparathyroidism of renal origin: Secondary | ICD-10-CM | POA: Diagnosis not present

## 2020-07-06 DIAGNOSIS — H4312 Vitreous hemorrhage, left eye: Secondary | ICD-10-CM | POA: Diagnosis not present

## 2020-07-06 DIAGNOSIS — Z992 Dependence on renal dialysis: Secondary | ICD-10-CM | POA: Diagnosis not present

## 2020-07-06 DIAGNOSIS — N186 End stage renal disease: Secondary | ICD-10-CM | POA: Diagnosis not present

## 2020-07-06 DIAGNOSIS — E1129 Type 2 diabetes mellitus with other diabetic kidney complication: Secondary | ICD-10-CM | POA: Diagnosis not present

## 2020-07-06 DIAGNOSIS — E113592 Type 2 diabetes mellitus with proliferative diabetic retinopathy without macular edema, left eye: Secondary | ICD-10-CM | POA: Diagnosis not present

## 2020-07-06 DIAGNOSIS — D472 Monoclonal gammopathy: Secondary | ICD-10-CM | POA: Diagnosis not present

## 2020-07-07 ENCOUNTER — Ambulatory Visit (INDEPENDENT_AMBULATORY_CARE_PROVIDER_SITE_OTHER): Payer: Medicare Other | Admitting: Ophthalmology

## 2020-07-07 ENCOUNTER — Other Ambulatory Visit: Payer: Self-pay

## 2020-07-07 ENCOUNTER — Encounter (INDEPENDENT_AMBULATORY_CARE_PROVIDER_SITE_OTHER): Payer: Self-pay | Admitting: Ophthalmology

## 2020-07-07 DIAGNOSIS — H4312 Vitreous hemorrhage, left eye: Secondary | ICD-10-CM

## 2020-07-07 DIAGNOSIS — E113512 Type 2 diabetes mellitus with proliferative diabetic retinopathy with macular edema, left eye: Secondary | ICD-10-CM

## 2020-07-07 NOTE — Patient Instructions (Signed)
No lifting and bending for 1 week. No water in the eye for 10 days. Do not rub the eye. Wear shield at night for 1-3 days.  Wear your CPAP as normal, if instructed by your doctor.  Continue your topical medications for a total of 3 weeks.  Do not refill your postoperative medications unless instructed.  Patient instructed to continue on the 3 topical drops provided by Dr. Rutherford Guys postop cataract surgery.  Antibiotic, steroid and topical NSAID for the next week and patient instructed to bring these medications to the office next week at his follow-up 1 week visit

## 2020-07-07 NOTE — Assessment & Plan Note (Signed)
Causative of recurrent vitreous hemorrhages with impact on visual acuity and function.  Patient reports clear of vitreous debris today  Postop day #1 post vitrectomy PRP

## 2020-07-07 NOTE — Progress Notes (Signed)
07/07/2020     CHIEF COMPLAINT Patient presents for Post-op Follow-up (1 Day POV OS//Pt sts he was able to sleep well last night. Pt c/o aching last night OS, but sts it has gone. Pt denies nausea or vomiting following sx. Pt using drops per Dr. Gershon Crane.)   HISTORY OF PRESENT ILLNESS: Henry Carroll is a 70 y.o. male who presents to the clinic today for:   HPI    Post-op Follow-up    Laterality: left eye   Comments: 1 Day POV OS  Pt sts he was able to sleep well last night. Pt c/o aching last night OS, but sts it has gone. Pt denies nausea or vomiting following sx. Pt using drops per Dr. Gershon Crane.          Comments    Postop day #1 status post vitrectomy endolaser PRP left eye for vitreous hemorrhage from proliferative diabetic retinopathy progressing       Last edited by Hurman Horn, MD on 07/07/2020  8:57 AM. (History)      Referring physician: [name], NP [place],  [place] [id number]  HISTORICAL INFORMATION:   Selected notes from the MEDICAL RECORD NUMBER    Lab Results  Component Value Date   HGBA1C 7.0 03/03/2020     CURRENT MEDICATIONS: No current outpatient medications on file. (Ophthalmic Drugs)   No current facility-administered medications for this visit. (Ophthalmic Drugs)   Current Outpatient Medications (Other)  Medication Sig  . acetaminophen (TYLENOL) 500 MG tablet Take 1,000 mg by mouth as needed.  Marland Kitchen ADVAIR DISKUS 100-50 MCG/DOSE AEPB SMARTSIG:1 Puff(s) Via Inhaler Twice Daily PRN  . amLODipine (NORVASC) 10 MG tablet Take 1 tablet by mouth once daily  . aspirin EC 81 MG tablet Take 1 tablet (81 mg total) by mouth daily.  Marland Kitchen atorvastatin (LIPITOR) 40 MG tablet TAKE 1 TABLET BY MOUTH ONCE DAILY IN THE EVENING  . carvedilol (COREG) 12.5 MG tablet Take 12.5 mg by mouth daily.   . Cholecalciferol (VITAMIN D3) 25 MCG (1000 UT) CAPS Take 2 capsules by mouth daily.  . Cinnamon 500 MG capsule Take 1,000 mg by mouth daily.    . Coenzyme Q10 10 MG capsule Take 10 mg by mouth daily.  Marland Kitchen epoetin alfa-epbx (RETACRIT) 22336 UNIT/ML injection 20,000 Units every 14 (fourteen) days.   Marland Kitchen ezetimibe (ZETIA) 10 MG tablet Take 1 tablet (10 mg total) by mouth daily.  . fluticasone (FLONASE) 50 MCG/ACT nasal spray Place 2 sprays into both nostrils daily as needed for allergies or rhinitis (SEASONAL ALLERGIES).  . furosemide (LASIX) 40 MG tablet Take 40 mg by mouth. On S-M-W-F Takes  1 tablet by mouth once d  . glucose blood (ONE TOUCH ULTRA TEST) test strip Use to test blood sugar three times daily. Dx: E11.21  . isosorbide dinitrate (ISORDIL) 10 MG tablet Take by mouth.  Marland Kitchen LEVEMIR 100 UNIT/ML injection INJECT 18 UNITS SUBCUTANEOUSLY ONCE DAILY IN THE MORNING  . montelukast (SINGULAIR) 10 MG tablet TAKE 1 TABLET BY MOUTH AT BEDTIME  . Multiple Vitamin (MULTIVITAMIN WITH MINERALS) TABS tablet Take 1 tablet by mouth daily.  . nitroGLYCERIN (NITROSTAT) 0.4 MG SL tablet Place 1 tablet (0.4 mg total) under the tongue every 5 (five) minutes as needed for chest pain.  Marland Kitchen PROAIR HFA 108 (90 Base) MCG/ACT inhaler INHALE 2 PUFFS BY MOUTH EVERY 6 HOURS AS NEEDED FOR SHORTNESS OF BREATH  . vitamin E 45 MG (100 UNITS) capsule Take by  mouth.   No current facility-administered medications for this visit. (Other)   Facility-Administered Medications Ordered in Other Visits (Other)  Medication Route  . regadenoson (LEXISCAN) injection SOLN 0.4 mg Intravenous      REVIEW OF SYSTEMS:    ALLERGIES No Known Allergies  PAST MEDICAL HISTORY Past Medical History:  Diagnosis Date  . Abnormal stress test 02/10/2015  . Acute on chronic systolic and diastolic heart failure, NYHA class 1 ([place]) 12/31/2014  . Anemia   . Anemia in chronic kidney disease 09/03/2012  . 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 Asthma   . Asthma, chronic 06/04/2012  . Asthma, chronic, mild intermittent, with acute exacerbation 12/22/2015  . Bilateral lower extremity edema  12/22/2015  . CAD (coronary artery disease) 02/18/2015  . Chronic combined systolic (congestive) and diastolic (congestive) heart failure ([place])    a. 12/31/14: 2D ECHO: EF 40-45%, HK of inf myocardium, G1DD, mod MR  . Chronic combined systolic and diastolic CHF (congestive heart failure) ([place]) 02/10/2015  . Chronic systolic heart failure ([place]) 02/25/2015  . CKD (chronic kidney disease), stage III (HCC)    stage 3 kidney disease  . CKD (chronic kidney disease), stage V ([place]) 08/30/2018  . COLONIC POLYPS, HX OF 04/13/2009   Qualifier: Diagnosis of  By: [place] Pam.   . Coronary artery disease    a. LHC 01/2015 - triple vessel CAD (mod oLM, mLAD, severe mRCA, intermediate branch stenosis, CTO of mCx). Plan CABG 02/2015.  Marland Kitchen Demand ischemia ([place]) 12/31/2014  . DM type 2, uncontrolled, with renal complications ([place]) 7/79/3903  . Dyspnea     due to fluid  . Elevated troponin   . Essential hypertension 10/09/2013  . Folliculitis of perineum 10/01/2012  . Gastric erosion   . GERD (gastroesophageal reflux disease)   . History of blood transfusion   . Hyperlipidemia   . Hyperlipidemia with target LDL less than 100 12/31/2014  . Hypertension   . Hypertensive heart disease with heart failure ([place]) 02/25/2015  . IDA (iron deficiency anemia) 01/23/2016  . Melena 01/23/2016  . 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 Mitral regurgitation    a. Mild-mod by echo 12/2014.  . Morbid obesity ([place]) 12/29/2014  . Myocardial infarction [facility])    pt. states per [name] he has in the past  . NSVT (nonsustained ventricular tachycardia) ([place])    a. 9 beats during 01/2015 adm. BB titrated.  . Nuclear sclerotic cataract of left eye 05/26/2019  . Nuclear sclerotic cataract of right eye 05/26/2019  . Obesity    a. BMI 33  . Obesity (BMI 30-39.9) 05/05/2013  . OSA (obstructive sleep apnea) 01/16/2018   Mild obstructive sleep apnea overall with an AHI of 7.3/h and no significant central  sleep apnea. Severe obstructive sleep apnea during REM sleep with an AHI of 34.3/h.  Now on CPAP at 6 cm H2O.   . Other malaise and fatigue 09/03/2012  . 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 PAD (peripheral artery disease) ([facility]) 03/02/2015  . Pneumonia   . Sleep apnea    2019, Dr.Turner diagnoised   . Small bowel lesion   . Symptomatic anemia 08/29/2018  . 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 Type II diabetes mellitus ([place])   . Vitreous hemorrhage of right eye ([place]) 05/26/2019  . Wears dentures    Past Surgical History:  Procedure Laterality Date  . BACK SURGERY    . BASCILIC VEIN TRANSPOSITION Left 11/14/2018   Procedure:  FIRST STAGE BASILIC VEIN TRANSPOSITION LEFT ARM;  Surgeon: Serafina Mitchell, MD;  Location: [facility];  Service: Vascular;  Laterality: Left;  . BASCILIC VEIN TRANSPOSITION Left 01/28/2019   Procedure: BASILIC VEIN TRANSPOSITION SECOND STAGE;  Surgeon: Serafina Mitchell, MD;  Location: [facility];  Service: Vascular;  Laterality: Left;  . BONE MARROW BIOPSY    . CARDIAC CATHETERIZATION N/A 02/09/2015   Procedure: Left Heart Cath and Coronary Angiography;  Surgeon: Burnell Blanks, MD;  Location: [facility];  Service: Cardiovascular;  Laterality: N/A;  . CATARACT EXTRACTION  06/11/2020  . COLONOSCOPY  2011   Dr. [name]: Ileocecal valve appeared normal, scattered pancolonic diverticulosis, difficult bowel prep making smaller lesions potentially missed. Recommended three-year follow-up colonoscopy.  . COLONOSCOPY  2003   Dr. [name]: Suspicious lesion at the ileocecal valve, multiple biopsies benign, pancolonic diverticulosis.  . COLONOSCOPY N/A 02/15/2016   diverticulosis in sigmoid and descending colon, single 5 mm polyp at splenic flexure (Tubular adenoma)  . CORONARY ARTERY BYPASS GRAFT N/A 02/18/2015   Procedure: CORONARY ARTERY BYPASS GRAFTING (CABG) x  four, using bilateral internal mammary arteries and right leg greater saphenous vein harvested  endoscopically;  Surgeon: [name], MD;  Location: [facility];  Service: Open Heart Surgery;  Laterality: N/A;  . ESOPHAGOGASTRODUODENOSCOPY N/A 02/15/2016   normal  . EYE SURGERY Right    July 2019   . GIVENS CAPSULE STUDY N/A 01/08/2017   couple of gastric and small bowel eroions in setting of aspirin 81 mg daily but nothing concerning, continue Hematology follow-up  . HERNIA REPAIR  [id number]   Umbilical  . LUMBAR [place] SURGERY  2006   L4 & L5  . REFRACTIVE SURGERY Left    2019   . REFRACTIVE SURGERY Right    2019  . TEE WITHOUT CARDIOVERSION N/A 02/18/2015   Procedure: TRANSESOPHAGEAL ECHOCARDIOGRAM (TEE);  Surgeon: [name], MD;  Location: [facility];  Service: Open Heart Surgery;  Laterality: N/A;  . TONSILLECTOMY  1962    FAMILY HISTORY Family History  Problem Relation Age of Onset  . Diabetes Mother   . Heart disease Mother        later in life, age >49  . Heart disease Father        heart failure later in life  . Hypertension Father   . Stroke Sister   . Kidney disease Daughter   . Sudden death Daughter   . Colon cancer Paternal Uncle        Passed from colon CA  . Heart attack Neg Hx     SOCIAL HISTORY Social History   Tobacco Use  . Smoking status: Former Smoker    Years: 17.00    Types: Cigars    Quit date: 12/31/2014    Years since quitting: 5.5  . Smokeless tobacco: Never Used  Vaping Use  . Vaping Use: Never used  Substance Use Topics  . Alcohol use: Yes    Alcohol/week: 3.0 standard drinks    Types: 3 Glasses of wine per week    Comment: occasional glass of wine.  . Drug use: No         OPHTHALMIC EXAM:  Base Eye Exam    Visual Acuity (ETDRS)      Right Left   Dist [employer] 20/40 +2 20/30   Dist ph [place] NI 20/30 +2       Tonometry (Tonopen, 8:31 AM)      Right Left   Pressure 15  19       Pupils      Dark Light Shape React APD   Right 4 3 Round Brisk None   Left 6 5 Round Dilated None       Neuro/Psych    Oriented x3: Yes   Mood/Affect:  Normal       Dilation    Left eye: 1.0% Mydriacyl, 2.5% Phenylephrine @ 8:31 AM        Slit Lamp and Fundus Exam    External Exam      Right Left   External Normal Normal       Slit Lamp Exam      Right Left   Lids/Lashes Normal Normal   Conjunctiva/Sclera White and quiet White and quiet   Cornea Clear Clear   Anterior Chamber Deep and quiet Deep and quiet   Iris Round and reactive Round and reactive   Lens Centered posterior chamber intraocular lens Centered posterior chamber intraocular lens   Anterior Vitreous Normal Normal       Fundus Exam      Right Left   Posterior Vitreous  Clear avitric   Disc  Normal   C/D Ratio  0.4   Macula  Microaneurysms, Clinically significant macular edema, Mild clinically significant macular edema   Vessels  PDR-quiet   Periphery  Good PRP fresh 360          IMAGING AND PROCEDURES  Imaging and Procedures for 07/07/20           ASSESSMENT/PLAN:  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  Patient reports clear of vitreous debris today  Postop day #1 post vitrectomy PRP  Vitreous hemorrhage of left eye ([place]) Now clear      ICD-10-CM   1. 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 Vitreous hemorrhage of left eye (HCC)  H43.12     1.  2.  3.  Ophthalmic Meds Ordered this visit:  No orders of the defined types were placed in this encounter.      Return in about 1 week (around 07/14/2020) for dilate, OS, POST OP, COLOR FP.  Patient Instructions  No lifting and bending for 1 week. No water in the eye for 10 days. Do not rub the eye. Wear shield at night for 1-3 days.  Wear your CPAP as normal, if instructed by your doctor.  Continue your topical medications for a total of 3  weeks.  Do not refill your postoperative medications unless instructed.  Patient instructed to continue on the 3 topical drops provided by Dr. Rutherford Guys postop cataract surgery.  Antibiotic, steroid and topical NSAID for the next week and patient instructed to bring these medications to the office next week at his follow-up 1 week visit    Explained the diagnoses, plan, and follow up with the patient and they expressed understanding.  Patient expressed understanding of the importance of proper follow up care.   Clent Demark Aislinn Feliz M.D. Diseases & Surgery of the Retina and Vitreous [facility] 07/07/20     Abbreviations: M myopia (nearsighted); A astigmatism; H hyperopia (farsighted); P presbyopia; Mrx spectacle prescription;  CTL contact lenses; OD right eye; OS left eye; OU both eyes  XT exotropia; ET esotropia; PEK punctate epithelial keratitis; PEE punctate epithelial  erosions; DES dry eye syndrome; MGD meibomian gland dysfunction; ATs artificial tears; PFAT's preservative free artificial tears; [place] nuclear sclerotic cataract; PSC posterior subcapsular cataract; ERM epi-retinal membrane; PVD posterior vitreous detachment; RD retinal detachment; DM diabetes mellitus; DR diabetic retinopathy; NPDR non-proliferative diabetic retinopathy; PDR proliferative diabetic retinopathy; CSME clinically significant macular edema; DME diabetic macular edema; dbh dot blot hemorrhages; CWS cotton wool spot; POAG primary open angle glaucoma; C/D cup-to-disc ratio; HVF humphrey visual field; GVF goldmann visual field; OCT optical coherence tomography; IOP intraocular pressure; BRVO Branch retinal vein occlusion; CRVO central retinal vein occlusion; CRAO central retinal artery occlusion; BRAO branch retinal artery occlusion; RT retinal tear; SB scleral buckle; PPV pars plana vitrectomy; VH Vitreous hemorrhage; PRP panretinal laser photocoagulation; IVK intravitreal kenalog; VMT vitreomacular  traction; MH Macular hole;  NVD neovascularization of the disc; NVE neovascularization elsewhere; AREDS age related eye disease study; ARMD age related macular degeneration; POAG primary open angle glaucoma; EBMD epithelial/anterior basement membrane dystrophy; ACIOL anterior chamber intraocular lens; IOL intraocular lens; PCIOL posterior chamber intraocular lens; Phaco/IOL phacoemulsification with intraocular lens placement; [place] photorefractive keratectomy; LASIK laser assisted in situ keratomileusis; HTN hypertension; DM diabetes mellitus; COPD chronic obstructive pulmonary disease

## 2020-07-07 NOTE — Assessment & Plan Note (Signed)
Now clear

## 2020-07-11 DIAGNOSIS — N2581 Secondary hyperparathyroidism of renal origin: Secondary | ICD-10-CM | POA: Diagnosis not present

## 2020-07-11 DIAGNOSIS — D472 Monoclonal gammopathy: Secondary | ICD-10-CM | POA: Diagnosis not present

## 2020-07-11 DIAGNOSIS — N186 End stage renal disease: Secondary | ICD-10-CM | POA: Diagnosis not present

## 2020-07-11 DIAGNOSIS — Z992 Dependence on renal dialysis: Secondary | ICD-10-CM | POA: Diagnosis not present

## 2020-07-11 DIAGNOSIS — E1129 Type 2 diabetes mellitus with other diabetic kidney complication: Secondary | ICD-10-CM | POA: Diagnosis not present

## 2020-07-12 ENCOUNTER — Ambulatory Visit: Payer: Medicare Other | Admitting: Cardiology

## 2020-07-13 ENCOUNTER — Ambulatory Visit (INDEPENDENT_AMBULATORY_CARE_PROVIDER_SITE_OTHER): Payer: Medicare Other | Admitting: Cardiology

## 2020-07-13 ENCOUNTER — Other Ambulatory Visit: Payer: Self-pay

## 2020-07-13 ENCOUNTER — Encounter: Payer: Self-pay | Admitting: Cardiology

## 2020-07-13 VITALS — BP 112/70 | HR 69 | Ht 74.0 in | Wt 201.0 lb

## 2020-07-13 DIAGNOSIS — Z951 Presence of aortocoronary bypass graft: Secondary | ICD-10-CM

## 2020-07-13 DIAGNOSIS — I5042 Chronic combined systolic (congestive) and diastolic (congestive) heart failure: Secondary | ICD-10-CM | POA: Diagnosis not present

## 2020-07-13 DIAGNOSIS — D472 Monoclonal gammopathy: Secondary | ICD-10-CM | POA: Diagnosis not present

## 2020-07-13 DIAGNOSIS — Z992 Dependence on renal dialysis: Secondary | ICD-10-CM | POA: Diagnosis not present

## 2020-07-13 DIAGNOSIS — E1129 Type 2 diabetes mellitus with other diabetic kidney complication: Secondary | ICD-10-CM | POA: Diagnosis not present

## 2020-07-13 DIAGNOSIS — D631 Anemia in chronic kidney disease: Secondary | ICD-10-CM | POA: Diagnosis not present

## 2020-07-13 DIAGNOSIS — E782 Mixed hyperlipidemia: Secondary | ICD-10-CM | POA: Diagnosis not present

## 2020-07-13 DIAGNOSIS — E1122 Type 2 diabetes mellitus with diabetic chronic kidney disease: Secondary | ICD-10-CM | POA: Diagnosis not present

## 2020-07-13 DIAGNOSIS — N2581 Secondary hyperparathyroidism of renal origin: Secondary | ICD-10-CM | POA: Diagnosis not present

## 2020-07-13 DIAGNOSIS — N186 End stage renal disease: Secondary | ICD-10-CM | POA: Diagnosis not present

## 2020-07-13 NOTE — Progress Notes (Signed)
Cardiology Office Note:    Date:  07/13/2020   ID:  Henry Carroll, DOB 06/19/1950, MRN [id number]  PCP:  [name], NP   [facility] Providers Cardiologist:  Candee Furbish, MD     Referring MD: [name], NP    History of Present Illness:    Henry Carroll is a 70 y.o. male here for the follow-up of coronary artery disease status post CABG in 2017.   CABG in 2017 [place] to LAD, free right internal mammary graft to ramus, SVG to OM, SVG to PDA. Other issues includediabetes, hypertension, hyperlipidemia, stage IV-VCKD (Dr. Posey Pronto), chronic combined systolic and diastolic congestive failureranging 30 to 50%, OSA, prior anemia and valvular heart disease.   He started dialysis in July 2021.  Dr. Posey Pronto.  He states that this has given him his life back.  He is glad he is doing this.  In fact, there talk to him about potential transplant again in [place].   Past Medical History:  Diagnosis Date  . Abnormal stress test 02/10/2015  . Acute on chronic systolic and diastolic heart failure, NYHA class 1 ([place]) 12/31/2014  . Anemia   . Anemia in chronic kidney disease 09/03/2012  . 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 Asthma   . Asthma, chronic 06/04/2012  . Asthma, chronic, mild intermittent, with acute exacerbation 12/22/2015  . Bilateral lower extremity edema 12/22/2015  . CAD (coronary artery disease) 02/18/2015  . Chronic combined systolic (congestive) and diastolic (congestive) heart failure ([place])    a. 12/31/14: 2D ECHO: EF 40-45%, HK of inf myocardium, G1DD, mod MR  . Chronic combined systolic and diastolic CHF (congestive heart failure) ([place]) 02/10/2015  . Chronic systolic heart failure ([place]) 02/25/2015  . CKD (chronic kidney disease), stage III (HCC)    stage 3 kidney disease  . CKD (chronic kidney disease), stage V ([place]) 08/30/2018  . COLONIC POLYPS, HX OF 04/13/2009   Qualifier: Diagnosis of  By: Westly Pam.   . Coronary artery disease    a.  LHC 01/2015 - triple vessel CAD (mod oLM, mLAD, severe mRCA, intermediate branch stenosis, CTO of mCx). Plan CABG 02/2015.  Marland Kitchen Demand ischemia ([place]) 12/31/2014  . DM type 2, uncontrolled, with renal complications ([place]) 2/76/1470  . Dyspnea     due to fluid  . Elevated troponin   . Essential hypertension 10/09/2013  . Folliculitis of perineum 10/01/2012  . Gastric erosion   . GERD (gastroesophageal reflux disease)   . History of blood transfusion   . Hyperlipidemia   . Hyperlipidemia with target LDL less than 100 12/31/2014  . Hypertension   . Hypertensive heart disease with heart failure ([place]) 02/25/2015  . IDA (iron deficiency anemia) 01/23/2016  . Melena 01/23/2016  . 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 Mitral regurgitation    a. Mild-mod by echo 12/2014.  . Morbid obesity ([place]) 12/29/2014  . Myocardial infarction [facility])    pt. states per [name] he has in the past  . NSVT (nonsustained ventricular tachycardia) ([place])    a. 9 beats during 01/2015 adm. BB titrated.  . Nuclear sclerotic cataract of left eye 05/26/2019  . Nuclear sclerotic cataract of right eye 05/26/2019  . Obesity    a. BMI 33  . Obesity (BMI 30-39.9) 05/05/2013  . OSA (obstructive sleep apnea) 01/16/2018   Mild obstructive sleep apnea overall with an AHI of 7.3/h and no significant central sleep apnea. Severe obstructive  sleep apnea during REM sleep with an AHI of 34.3/h.  Now on CPAP at 6 cm H2O.   . Other malaise and fatigue 09/03/2012  . 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 PAD (peripheral artery disease) ([place]) 03/02/2015  . Pneumonia   . Sleep apnea    2019, Dr.Turner diagnoised   . Small bowel lesion   . Symptomatic anemia 08/29/2018  . 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 Type II diabetes mellitus ([place])   . Vitreous hemorrhage of right eye ([place]) 05/26/2019  . Wears dentures     Past Surgical History:  Procedure Laterality Date  . BACK SURGERY    . BASCILIC VEIN  TRANSPOSITION Left 11/14/2018   Procedure: FIRST STAGE BASILIC VEIN TRANSPOSITION LEFT ARM;  Surgeon: Serafina Mitchell, MD;  Location: [place];  Service: Vascular;  Laterality: Left;  . BASCILIC VEIN TRANSPOSITION Left 01/28/2019   Procedure: BASILIC VEIN TRANSPOSITION SECOND STAGE;  Surgeon: Serafina Mitchell, MD;  Location: [place];  Service: Vascular;  Laterality: Left;  . BONE MARROW BIOPSY    . CARDIAC CATHETERIZATION N/A 02/09/2015   Procedure: Left Heart Cath and Coronary Angiography;  Surgeon: Burnell Blanks, MD;  Location: [place] [facility];  Service: Cardiovascular;  Laterality: N/A;  . CATARACT EXTRACTION  06/11/2020  . COLONOSCOPY  2011   Dr. [name]: Ileocecal valve appeared normal, scattered pancolonic diverticulosis, difficult bowel prep making smaller lesions potentially missed. Recommended three-year follow-up colonoscopy.  . COLONOSCOPY  2003   Dr. [name]: Suspicious lesion at the ileocecal valve, multiple biopsies benign, pancolonic diverticulosis.  . COLONOSCOPY N/A 02/15/2016   diverticulosis in sigmoid and descending colon, single 5 mm polyp at splenic flexure (Tubular adenoma)  . CORONARY ARTERY BYPASS GRAFT N/A 02/18/2015   Procedure: CORONARY ARTERY BYPASS GRAFTING (CABG) x  four, using bilateral internal mammary arteries and right leg greater saphenous vein harvested endoscopically;  Surgeon: [name], MD;  Location: [place] OR;  Service: Open Heart Surgery;  Laterality: N/A;  . ESOPHAGOGASTRODUODENOSCOPY N/A 02/15/2016   normal  . EYE SURGERY Right    July 2019   . GIVENS CAPSULE STUDY N/A 01/08/2017   couple of gastric and small bowel eroions in setting of aspirin 81 mg daily but nothing concerning, continue Hematology follow-up  . HERNIA REPAIR  3614   Umbilical  . LUMBAR [place] SURGERY  2006   L4 & L5  . REFRACTIVE SURGERY Left    2019   . REFRACTIVE SURGERY Right    2019  . TEE WITHOUT CARDIOVERSION N/A 02/18/2015   Procedure: TRANSESOPHAGEAL ECHOCARDIOGRAM (TEE);   Surgeon: [name], MD;  Location: [place];  Service: Open Heart Surgery;  Laterality: N/A;  . TONSILLECTOMY  1962    Current Medications: Current Meds  Medication Sig  . acetaminophen (TYLENOL) 500 MG tablet Take 1,000 mg by mouth as needed.  Marland Kitchen ADVAIR DISKUS 100-50 MCG/DOSE AEPB SMARTSIG:1 Puff(s) Via Inhaler Twice Daily PRN  . amLODipine (NORVASC) 10 MG tablet Take 1 tablet by mouth once daily  . aspirin EC 81 MG tablet Take 1 tablet (81 mg total) by mouth daily.  Marland Kitchen atorvastatin (LIPITOR) 40 MG tablet TAKE 1 TABLET BY MOUTH ONCE DAILY IN THE EVENING  . carvedilol (COREG) 12.5 MG tablet Take 12.5 mg by mouth daily.   . Cholecalciferol (VITAMIN D3) 25 MCG (1000 UT) CAPS Take 2 capsules by mouth daily.  . Cinnamon 500 MG capsule Take 1,000 mg by mouth daily.   . Coenzyme Q10 10 MG capsule  Take 10 mg by mouth daily.  Marland Kitchen epoetin alfa-epbx (RETACRIT) 25852 UNIT/ML injection 20,000 Units every 14 (fourteen) days.   Marland Kitchen ezetimibe (ZETIA) 10 MG tablet Take 1 tablet (10 mg total) by mouth daily.  . fluticasone (FLONASE) 50 MCG/ACT nasal spray Place 2 sprays into both nostrils daily as needed for allergies or rhinitis (SEASONAL ALLERGIES).  . furosemide (LASIX) 40 MG tablet Take 40 mg by mouth. On S-M-W-F Takes  1 tablet by mouth once d  . glucose blood (ONE TOUCH ULTRA TEST) test strip Use to test blood sugar three times daily. Dx: E11.21  . isosorbide dinitrate (ISORDIL) 10 MG tablet Take by mouth.  Marland Kitchen LEVEMIR 100 UNIT/ML injection INJECT 18 UNITS SUBCUTANEOUSLY ONCE DAILY IN THE MORNING  . montelukast (SINGULAIR) 10 MG tablet TAKE 1 TABLET BY MOUTH AT BEDTIME  . Multiple Vitamin (MULTIVITAMIN WITH MINERALS) TABS tablet Take 1 tablet by mouth daily.  . nitroGLYCERIN (NITROSTAT) 0.4 MG SL tablet Place 1 tablet (0.4 mg total) under the tongue every 5 (five) minutes as needed for chest pain.  Marland Kitchen PROAIR HFA 108 (90 Base) MCG/ACT inhaler INHALE 2 PUFFS BY MOUTH EVERY 6 HOURS AS NEEDED FOR SHORTNESS OF  BREATH  . vitamin E 45 MG (100 UNITS) capsule Take by mouth.     Allergies:   Patient has no known allergies.   Social History   Socioeconomic History  . Marital status: Married    Spouse name: Not on file  . Number of children: Not on file  . Years of education: Not on file  . Highest education level: Not on file  Occupational History  . Not on file  Tobacco Use  . Smoking status: Former Smoker    Years: 17.00    Types: Cigars    Quit date: 12/31/2014    Years since quitting: 5.5  . Smokeless tobacco: Never Used  Vaping Use  . Vaping Use: Never used  Substance and Sexual Activity  . Alcohol use: Yes    Alcohol/week: 3.0 standard drinks    Types: 3 Glasses of wine per week    Comment: occasional glass of wine.  . Drug use: No  . Sexual activity: Yes    Birth control/protection: None  Other Topics Concern  . Not on file  Social History Narrative  . Not on file   Social Determinants of Health   Financial Resource Strain: Not on file  Food Insecurity: Not on file  Transportation Needs: Not on file  Physical Activity: Not on file  Stress: Not on file  Social Connections: Not on file     Family History: The patient's family history includes Colon cancer in his paternal uncle; Diabetes in his mother; Heart disease in his father and mother; Hypertension in his father; Kidney disease in his daughter; Stroke in his sister; Sudden death in his daughter. There is no history of Heart attack.  ROS:   Please see the history of present illness.     All other systems reviewed and are negative.  EKGs/Labs/Other Studies Reviewed:    The following studies were reviewed today:  Echocardiogram 11/03/2018:   1. Left ventricular ejection fraction, by visual estimation, is 40 to  45%. The left ventricle has mild to moderately decreased function. Normal  left ventricular size. There is mildly increased left ventricular  hypertrophy. Severe hypokinesis of the  basal to mid  inferior and inferolateral walls.  2. The mitral valve is normal in structure. Trace mitral valve  regurgitation. No evidence  of mitral stenosis.  3. The tricuspid valve is normal in structure. Tricuspid valve  regurgitation is trivial.  4. The aortic valve is tricuspid Aortic valve regurgitation was not  visualized by color flow Doppler. Mild aortic valve sclerosis without  stenosis.  5. 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  6. Left atrial size was mildly dilated.  7. Left ventricular diastolic Doppler parameters are consistent with  pseudonormalization pattern of LV diastolic filling.  8. Right atrial size was mildly dilated.  9. Global right ventricle has mildly reduced systolic function.The right  ventricular size is mildly enlarged. No increase in right ventricular wall  thickness.  10. The inferior vena cava is normal in size with greater than 50%  respiratory variability, suggesting right atrial pressure of 3 mmHg.  11. Trivial pericardial effusion is present.  12. The tricuspid regurgitant velocity is 2.90 m/s, and with an assumed  right atrial pressure of 3 mmHg, the estimated right ventricular systolic  pressure is mildly elevated at 36.6 mmHg.     08/12/2019: Magnesium 2.0 03/03/2020: TSH 0.93 04/06/2020: ALT 14; BUN 31; Creatinine, Ser 5.43; Hemoglobin 12.6; Platelets 192; Potassium 3.6; Sodium 139  Recent Lipid Panel    Component Value Date/Time   CHOL 127 11/16/2019 0930   TRIG 74 11/16/2019 0930   HDL 60 11/16/2019 0930   CHOLHDL 2.1 11/16/2019 0930   CHOLHDL 3.8 01/07/2019 1108   VLDL 33 (H) 09/26/2016 0903   LDLCALC 52 11/16/2019 0930   LDLCALC 62 01/07/2019 1108     Risk Assessment/Calculations:      Physical Exam:    VS:  BP 112/70 (BP Location: Left Arm, Patient Position: Sitting, Cuff Size: Normal)   Pulse 69   Ht $R'6\' 2"'am$  (1.88 m)   Wt 201 lb (91.2 kg)   SpO2 95%   BMI 25.81 kg/m     Wt Readings from Last 3  Encounters:  07/13/20 201 lb (91.2 kg)  06/23/20 205 lb (93 kg)  06/03/20 205 lb (93 kg)     GEN:  Well nourished, well developed in no acute distress HEENT: Normal NECK: No JVD; No carotid bruits LYMPHATICS: No lymphadenopathy CARDIAC: RRR, no murmurs, rubs, gallops RESPIRATORY:  Clear to auscultation without rales, wheezing or rhonchi  ABDOMEN: Soft, non-tender, non-distended MUSCULOSKELETAL:  No edema; No deformity  SKIN: Warm and dry NEUROLOGIC:  Alert and oriented x 3 PSYCHIATRIC:  Normal affect   ASSESSMENT:    1. Chronic combined systolic and diastolic CHF (congestive heart failure) ([place])   2. S/P CABG x 4   3. Mixed hyperlipidemia    PLAN:    In order of problems listed above:  Coronary artery disease post CABG - Doing well on long-acting nitrate.  Isosorbide.  No anginal symptoms. - In 2018 nuclear stress test was stable.  Chronic systolic heart failure - EF 40 to 45% echocardiogram reviewed as above. - His volume is taken off during dialysis.  Doing well.  Chronic kidney disease stage V on hemodialysis - He started dialysis in July 2021.  Feels well.  He has had transplant work-up at both [place] as well as [place].  Essential hypertension - Nephrology continues to handle.  Overall excellent blood pressure.  Hyperlipidemia - Continue with statin, atorvastatin as well as Zetia.  Obstructive sleep apnea - Follows with [name].       Medication Adjustments/Labs and Tests Ordered: Current medicines are reviewed at length with the patient today.  Concerns regarding medicines  are outlined above.  No orders of the defined types were placed in this encounter.  No orders of the defined types were placed in this encounter.   Patient Instructions  Medication Instructions:  The current medical regimen is effective;  continue present plan and medications.  *If you need a refill on your cardiac medications before your next appointment, please call  your pharmacy*  Follow-Up: At [facility], you and your health needs are our priority.  As part of our continuing mission to provide you with exceptional heart care, we have created designated Provider Care Teams.  These Care Teams include your primary Cardiologist (physician) and Advanced Practice Providers (APPs -  Physician Assistants and Nurse Practitioners) who all work together to provide you with the care you need, when you need it.  We recommend signing up for the patient portal called "MyChart".  Sign up information is provided on this After Visit Summary.  MyChart is used to connect with patients for Virtual Visits (Telemedicine).  Patients are able to view lab/test results, encounter notes, upcoming appointments, etc.  Non-urgent messages can be sent to your provider as well.   To learn more about what you can do with MyChart, go to [email or web address].    Your next appointment:   6 month(s)  The format for your next appointment:   In Person  Provider:   Candee Furbish, MD   Thank you for choosing [facility]!!         Signed, Candee Furbish, MD  07/13/2020 4:54 PM    [place]

## 2020-07-13 NOTE — Patient Instructions (Signed)

## 2020-07-14 ENCOUNTER — Encounter (INDEPENDENT_AMBULATORY_CARE_PROVIDER_SITE_OTHER): Payer: Self-pay | Admitting: Ophthalmology

## 2020-07-14 ENCOUNTER — Ambulatory Visit (INDEPENDENT_AMBULATORY_CARE_PROVIDER_SITE_OTHER): Payer: Medicare Other | Admitting: Ophthalmology

## 2020-07-14 DIAGNOSIS — E113512 Type 2 diabetes mellitus with proliferative diabetic retinopathy with macular edema, left eye: Secondary | ICD-10-CM | POA: Diagnosis not present

## 2020-07-14 DIAGNOSIS — H4312 Vitreous hemorrhage, left eye: Secondary | ICD-10-CM

## 2020-07-14 NOTE — Assessment & Plan Note (Addendum)
Ofloxacin  4 times daily to the operative eye  Prednisolone acetate 1 drop to the operative eye 4 times daily  Patient instructed not to refill the medications and use them for maximum of 3 weeks.  Patient instructed do not rub the eye.  Patient has the option to use the patch at night.   No lifting and bending for 1 week. No water IN the eye for 10 days. Do not rub the eye. Wear shield at night for 1-3 days.  Continue your topical medications for a total of 3 weeks.  Do not refill your postoperative medications unless instructed.  Refrain from exercise or intentional activity which increases our heart rate above resting levels.  Normal walking to complete normal activities of your day are appropriate.  Driving:  Legally, you only need one good eye, of 20/40 or better to drive.  However, the practice does not recommend driving during first weeks after surgery, IF you are uncomfortable with your visual functioning or capabilities.   If you have known sleep apnea, wear your CPAP as you normally should. May return to full activity in 3 days.

## 2020-07-14 NOTE — Assessment & Plan Note (Signed)
OS looks great after recent vitrectomy and endolaser PRP.  Clear media.  Quiescent PDR underway

## 2020-07-14 NOTE — Progress Notes (Signed)
07/14/2020     CHIEF COMPLAINT Patient presents for Post-op Follow-up (1 Wk POV OS//Pt reports improved VA OS. Pt denies ocular pain. VA stable OD. Pt sts he is using drops OS per Dr. Gershon Crane - ketorolac, oflox, and pred.)   HISTORY OF PRESENT ILLNESS: Henry Carroll is a 70 y.o. male who presents to the clinic today for:   HPI    Post-op Follow-up    Laterality: left eye   Vision: is improved   Comments: 1 Wk POV OS  Pt reports improved VA OS. Pt denies ocular pain. VA stable OD. Pt sts he is using drops OS per Dr. Gershon Crane - ketorolac, oflox, and pred.       Last edited by Rockie Neighbours, [place] on 07/14/2020  3:32 PM. (History)      Referring physician: [name], NP [place],  [place] [address]  HISTORICAL INFORMATION:   Selected notes from the MEDICAL RECORD NUMBER    Lab Results  Component Value Date   HGBA1C 7.0 03/03/2020     CURRENT MEDICATIONS: No current outpatient medications on file. (Ophthalmic Drugs)   No current facility-administered medications for this visit. (Ophthalmic Drugs)   Current Outpatient Medications (Other)  Medication Sig  . acetaminophen (TYLENOL) 500 MG tablet Take 1,000 mg by mouth as needed.  Marland Kitchen ADVAIR DISKUS 100-50 MCG/DOSE AEPB SMARTSIG:1 Puff(s) Via Inhaler Twice Daily PRN  . amLODipine (NORVASC) 10 MG tablet Take 1 tablet by mouth once daily  . aspirin EC 81 MG tablet Take 1 tablet (81 mg total) by mouth daily.  Marland Kitchen atorvastatin (LIPITOR) 40 MG tablet TAKE 1 TABLET BY MOUTH ONCE DAILY IN THE EVENING  . carvedilol (COREG) 12.5 MG tablet Take 12.5 mg by mouth daily.   . Cholecalciferol (VITAMIN D3) 25 MCG (1000 UT) CAPS Take 2 capsules by mouth daily.  . Cinnamon 500 MG capsule Take 1,000 mg by mouth daily.   . Coenzyme Q10 10 MG capsule Take 10 mg by mouth daily.  Marland Kitchen epoetin alfa-epbx (RETACRIT) 02409 UNIT/ML injection 20,000 Units every 14 (fourteen) days.   Marland Kitchen ezetimibe (ZETIA) 10 MG tablet Take 1 tablet (10  mg total) by mouth daily.  . fluticasone (FLONASE) 50 MCG/ACT nasal spray Place 2 sprays into both nostrils daily as needed for allergies or rhinitis (SEASONAL ALLERGIES).  . furosemide (LASIX) 40 MG tablet Take 40 mg by mouth. On S-M-W-F Takes  1 tablet by mouth once d  . glucose blood (ONE TOUCH ULTRA TEST) test strip Use to test blood sugar three times daily. Dx: E11.21  . isosorbide dinitrate (ISORDIL) 10 MG tablet Take by mouth.  Marland Kitchen LEVEMIR 100 UNIT/ML injection INJECT 18 UNITS SUBCUTANEOUSLY ONCE DAILY IN THE MORNING  . montelukast (SINGULAIR) 10 MG tablet TAKE 1 TABLET BY MOUTH AT BEDTIME  . Multiple Vitamin (MULTIVITAMIN WITH MINERALS) TABS tablet Take 1 tablet by mouth daily.  . nitroGLYCERIN (NITROSTAT) 0.4 MG SL tablet Place 1 tablet (0.4 mg total) under the tongue every 5 (five) minutes as needed for chest pain.  Marland Kitchen PROAIR HFA 108 (90 Base) MCG/ACT inhaler INHALE 2 PUFFS BY MOUTH EVERY 6 HOURS AS NEEDED FOR SHORTNESS OF BREATH  . vitamin E 45 MG (100 UNITS) capsule Take by mouth.   No current facility-administered medications for this visit. (Other)   Facility-Administered Medications Ordered in Other Visits (Other)  Medication Route  . regadenoson (LEXISCAN) injection SOLN 0.4 mg Intravenous      REVIEW OF SYSTEMS:  ALLERGIES No Known Allergies  PAST MEDICAL HISTORY Past Medical History:  Diagnosis Date  . Abnormal stress test 02/10/2015  . Acute on chronic systolic and diastolic heart failure, NYHA class 1 ([place]) 12/31/2014  . Anemia   . Anemia in chronic kidney disease 09/03/2012  . 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 Asthma   . Asthma, chronic 06/04/2012  . Asthma, chronic, mild intermittent, with acute exacerbation 12/22/2015  . Bilateral lower extremity edema 12/22/2015  . CAD (coronary artery disease) 02/18/2015  . Chronic combined systolic (congestive) and diastolic (congestive) heart failure ([place])    a. 12/31/14: 2D ECHO: EF 40-45%, HK of inf myocardium, G1DD,  mod MR  . Chronic combined systolic and diastolic CHF (congestive heart failure) ([place]) 02/10/2015  . Chronic systolic heart failure ([place]) 02/25/2015  . CKD (chronic kidney disease), stage III (HCC)    stage 3 kidney disease  . CKD (chronic kidney disease), stage V ([place]) 08/30/2018  . COLONIC POLYPS, HX OF 04/13/2009   Qualifier: Diagnosis of  By: West[place] Pam.   . Coronary artery disease    a. LHC 01/2015 - triple vessel CAD (mod oLM, mLAD, severe mRCA, intermediate branch stenosis, CTO of mCx). Plan CABG 02/2015.  Marland Kitchen Demand ischemia ([place]) 12/31/2014  . DM type 2, uncontrolled, with renal complications ([place]) 0/08/1217  . Dyspnea     due to fluid  . Elevated troponin   . Essential hypertension 10/09/2013  . Folliculitis of perineum 10/01/2012  . Gastric erosion   . GERD (gastroesophageal reflux disease)   . History of blood transfusion   . Hyperlipidemia   . Hyperlipidemia with target LDL less than 100 12/31/2014  . Hypertension   . Hypertensive heart disease with heart failure ([place]) 02/25/2015  . IDA (iron deficiency anemia) 01/23/2016  . Melena 01/23/2016  . 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 Mitral regurgitation    a. Mild-mod by echo 12/2014.  . Morbid obesity ([place]) 12/29/2014  . Myocardial infarction [facility])    pt. states per [name] he has in the past  . NSVT (nonsustained ventricular tachycardia) ([place])    a. 9 beats during 01/2015 adm. BB titrated.  . Nuclear sclerotic cataract of left eye 05/26/2019  . Nuclear sclerotic cataract of right eye 05/26/2019  . Obesity    a. BMI 33  . Obesity (BMI 30-39.9) 05/05/2013  . OSA (obstructive sleep apnea) 01/16/2018   Mild obstructive sleep apnea overall with an AHI of 7.3/h and no significant central sleep apnea. Severe obstructive sleep apnea during REM sleep with an AHI of 34.3/h.  Now on CPAP at 6 cm H2O.   . Other malaise and fatigue 09/03/2012  . 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 PAD  (peripheral artery disease) ([place]) 03/02/2015  . Pneumonia   . Sleep apnea    2019, Dr.Turner diagnoised   . Small bowel lesion   . Symptomatic anemia 08/29/2018  . 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 Type II diabetes mellitus ([place])   . Vitreous hemorrhage of right eye ([place]) 05/26/2019  . Wears dentures    Past Surgical History:  Procedure Laterality Date  . BACK SURGERY    . BASCILIC VEIN TRANSPOSITION Left 11/14/2018   Procedure: FIRST STAGE BASILIC VEIN TRANSPOSITION LEFT ARM;  Surgeon: Serafina Mitchell, MD;  Location: [facility];  Service: Vascular;  Laterality: Left;  . BASCILIC VEIN TRANSPOSITION Left 01/28/2019   Procedure: BASILIC VEIN TRANSPOSITION SECOND STAGE;  Surgeon: Serafina Mitchell,  MD;  Location: [facility];  Service: Vascular;  Laterality: Left;  . BONE MARROW BIOPSY    . CARDIAC CATHETERIZATION N/A 02/09/2015   Procedure: Left Heart Cath and Coronary Angiography;  Surgeon: Burnell Blanks, MD;  Location: [facility] CV LAB;  Service: Cardiovascular;  Laterality: N/A;  . CATARACT EXTRACTION  06/11/2020  . COLONOSCOPY  2011   Dr. [name]: Ileocecal valve appeared normal, scattered pancolonic diverticulosis, difficult bowel prep making smaller lesions potentially missed. Recommended three-year follow-up colonoscopy.  . COLONOSCOPY  2003   Dr. [name]: Suspicious lesion at the ileocecal valve, multiple biopsies benign, pancolonic diverticulosis.  . COLONOSCOPY N/A 02/15/2016   diverticulosis in sigmoid and descending colon, single 5 mm polyp at splenic flexure (Tubular adenoma)  . CORONARY ARTERY BYPASS GRAFT N/A 02/18/2015   Procedure: CORONARY ARTERY BYPASS GRAFTING (CABG) x  four, using bilateral internal mammary arteries and right leg greater saphenous vein harvested endoscopically;  Surgeon: [name], MD;  Location: [facility] OR;  Service: Open Heart Surgery;  Laterality: N/A;  . ESOPHAGOGASTRODUODENOSCOPY N/A 02/15/2016   normal  . EYE SURGERY Right    July 2019   . GIVENS  CAPSULE STUDY N/A 01/08/2017   couple of gastric and small bowel eroions in setting of aspirin 81 mg daily but nothing concerning, continue Hematology follow-up  . HERNIA REPAIR  [id number]   Umbilical  . LUMBAR [place] SURGERY  2006   L4 & L5  . REFRACTIVE SURGERY Left    2019   . REFRACTIVE SURGERY Right    2019  . TEE WITHOUT CARDIOVERSION N/A 02/18/2015   Procedure: TRANSESOPHAGEAL ECHOCARDIOGRAM (TEE);  Surgeon: [name], MD;  Location: [facility];  Service: Open Heart Surgery;  Laterality: N/A;  . TONSILLECTOMY  1962    FAMILY HISTORY Family History  Problem Relation Age of Onset  . Diabetes Mother   . Heart disease Mother        later in life, age >82  . Heart disease Father        heart failure later in life  . Hypertension Father   . Stroke Sister   . Kidney disease Daughter   . Sudden death Daughter   . Colon cancer Paternal Uncle        Passed from colon CA  . Heart attack Neg Hx     SOCIAL HISTORY Social History   Tobacco Use  . Smoking status: Former Smoker    Years: 17.00    Types: Cigars    Quit date: 12/31/2014    Years since quitting: 5.5  . Smokeless tobacco: Never Used  Vaping Use  . Vaping Use: Never used  Substance Use Topics  . Alcohol use: Yes    Alcohol/week: 3.0 standard drinks    Types: 3 Glasses of wine per week    Comment: occasional glass of wine.  . Drug use: No         OPHTHALMIC EXAM:  Base Eye Exam    Visual Acuity (ETDRS)      Right Left   Dist [place] 20/50 +2 20/20 -1   Dist ph [place] NI        Tonometry (Tonopen, 3:36 PM)      Right Left   Pressure 17 21       Pupils      Pupils Dark Light Shape React APD   Right PERRL 4 3 Round Brisk None   Left PERRL 4 3 Round Brisk None  Visual Fields (Counting fingers)      Left Right    Full Full       Extraocular Movement      Right Left    Full Full       Neuro/Psych    Oriented x3: Yes   Mood/Affect: Normal       Dilation    Left eye: 1.0% Mydriacyl, 2.5%  Phenylephrine @ 3:36 PM        Slit Lamp and Fundus Exam    External Exam      Right Left   External Normal Normal       Slit Lamp Exam      Right Left   Lids/Lashes Normal Normal   Conjunctiva/Sclera White and quiet White and quiet   Cornea Clear Clear   Anterior Chamber Deep and quiet Deep and quiet   Iris Round and reactive Round and reactive   Lens Centered posterior chamber intraocular lens Centered posterior chamber intraocular lens   Anterior Vitreous Normal Normal       Fundus Exam      Right Left   Posterior Vitreous  Clear avitric   Disc  Normal   C/D Ratio  0.4   Macula  Microaneurysms, Clinically significant macular edema, Mild clinically significant macular edema   Vessels  PDR-quiet   Periphery  Good PRP fresh 360          IMAGING AND PROCEDURES  Imaging and Procedures for 07/14/20  Color Fundus Photography Optos - OU - Both Eyes       Right Eye Progression has been stable. Disc findings include normal observations. Macula : microaneurysms.   Left Eye Progression has been stable. Disc findings include normal observations.   Notes OD, proliferative diabetic retinopathy completely quiescent good PRP 360 clear media  OS,  Clear, PDR, much less active.                ASSESSMENT/PLAN:  Vitreous hemorrhage of left eye (HCC) Ofloxacin  4 times daily to the operative eye  Prednisolone acetate 1 drop to the operative eye 4 times daily  Patient instructed not to refill the medications and use them for maximum of 3 weeks.  Patient instructed do not rub the eye.  Patient has the option to use the patch at night.   No lifting and bending for 1 week. No water IN the eye for 10 days. Do not rub the eye. Wear shield at night for 1-3 days.  Continue your topical medications for a total of 3 weeks.  Do not refill your postoperative medications unless instructed.  Refrain from exercise or intentional activity which increases our heart rate above  resting levels.  Normal walking to complete normal activities of your day are appropriate.  Driving:  Legally, you only need one good eye, of 20/40 or better to drive.  However, the practice does not recommend driving during first weeks after surgery, IF you are uncomfortable with your visual functioning or capabilities.   If you have known sleep apnea, wear your CPAP as you normally should.  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  Clear media.  Quiescent PDR underway      ICD-10-CM   1. 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 Vitreous  hemorrhage of left eye (HCC)  H43.12     1.  OS looks great with PDR previously active now much less active good PRP clear media vitreous hemorrhage cleared.  2.  Patient will continue eyedrops for the next 2 weeks without refilling the current medications.  In other words if completed prior to 2 weeks do not refill.  End of 2 weeks any medications left over should be discontinued  3.  Ophthalmic Meds Ordered this visit:  No orders of the defined types were placed in this encounter.      Return in about 8 weeks (around 09/08/2020) for dilate, OS, POST OP, OCT.  There are no Patient Instructions on file for this visit.   Explained the diagnoses, plan, and follow up with the patient and they expressed understanding.  Patient expressed understanding of the importance of proper follow up care.   Clent Demark Aja Whitehair M.D. Diseases & Surgery of the Retina and Vitreous Retina & Diabetic [place] 07/14/20     Abbreviations: M myopia (nearsighted); A astigmatism; H hyperopia (farsighted); P presbyopia; Mrx spectacle prescription;  CTL contact lenses; OD right eye; OS left eye; OU both eyes  XT  exotropia; ET esotropia; PEK punctate epithelial keratitis; PEE punctate epithelial erosions; DES dry eye syndrome; MGD meibomian gland dysfunction; ATs artificial tears; PFAT's preservative free artificial tears; [place] nuclear sclerotic cataract; PSC posterior subcapsular cataract; ERM epi-retinal membrane; PVD posterior vitreous detachment; RD retinal detachment; DM diabetes mellitus; DR diabetic retinopathy; NPDR non-proliferative diabetic retinopathy; PDR proliferative diabetic retinopathy; CSME clinically significant macular edema; DME diabetic macular edema; dbh dot blot hemorrhages; CWS cotton wool spot; POAG primary open angle glaucoma; C/D cup-to-disc ratio; HVF humphrey visual field; GVF goldmann visual field; OCT optical coherence tomography; IOP intraocular pressure; BRVO Branch retinal vein occlusion; CRVO central retinal vein occlusion; CRAO central retinal artery occlusion; BRAO branch retinal artery occlusion; RT retinal tear; SB scleral buckle; PPV pars plana vitrectomy; VH Vitreous hemorrhage; PRP panretinal laser photocoagulation; IVK intravitreal kenalog; VMT vitreomacular traction; MH Macular hole;  NVD neovascularization of the disc; NVE neovascularization elsewhere; AREDS age related eye disease study; ARMD age related macular degeneration; POAG primary open angle glaucoma; EBMD epithelial/anterior basement membrane dystrophy; ACIOL anterior chamber intraocular lens; IOL intraocular lens; PCIOL posterior chamber intraocular lens; Phaco/IOL phacoemulsification with intraocular lens placement; [place] photorefractive keratectomy; LASIK laser assisted in situ keratomileusis; HTN hypertension; DM diabetes mellitus; COPD chronic obstructive pulmonary disease

## 2020-07-15 DIAGNOSIS — E1129 Type 2 diabetes mellitus with other diabetic kidney complication: Secondary | ICD-10-CM | POA: Diagnosis not present

## 2020-07-15 DIAGNOSIS — Z992 Dependence on renal dialysis: Secondary | ICD-10-CM | POA: Diagnosis not present

## 2020-07-15 DIAGNOSIS — N186 End stage renal disease: Secondary | ICD-10-CM | POA: Diagnosis not present

## 2020-07-15 DIAGNOSIS — D472 Monoclonal gammopathy: Secondary | ICD-10-CM | POA: Diagnosis not present

## 2020-07-15 DIAGNOSIS — N2581 Secondary hyperparathyroidism of renal origin: Secondary | ICD-10-CM | POA: Diagnosis not present

## 2020-07-17 ENCOUNTER — Other Ambulatory Visit: Payer: Self-pay | Admitting: Nurse Practitioner

## 2020-07-18 DIAGNOSIS — E1129 Type 2 diabetes mellitus with other diabetic kidney complication: Secondary | ICD-10-CM | POA: Diagnosis not present

## 2020-07-18 DIAGNOSIS — N186 End stage renal disease: Secondary | ICD-10-CM | POA: Diagnosis not present

## 2020-07-18 DIAGNOSIS — D472 Monoclonal gammopathy: Secondary | ICD-10-CM | POA: Diagnosis not present

## 2020-07-18 DIAGNOSIS — N2581 Secondary hyperparathyroidism of renal origin: Secondary | ICD-10-CM | POA: Diagnosis not present

## 2020-07-18 DIAGNOSIS — Z992 Dependence on renal dialysis: Secondary | ICD-10-CM | POA: Diagnosis not present

## 2020-07-19 ENCOUNTER — Ambulatory Visit: Payer: Medicare Other | Admitting: Cardiology

## 2020-07-20 DIAGNOSIS — D472 Monoclonal gammopathy: Secondary | ICD-10-CM | POA: Diagnosis not present

## 2020-07-20 DIAGNOSIS — N186 End stage renal disease: Secondary | ICD-10-CM | POA: Diagnosis not present

## 2020-07-20 DIAGNOSIS — N2581 Secondary hyperparathyroidism of renal origin: Secondary | ICD-10-CM | POA: Diagnosis not present

## 2020-07-20 DIAGNOSIS — Z992 Dependence on renal dialysis: Secondary | ICD-10-CM | POA: Diagnosis not present

## 2020-07-20 DIAGNOSIS — E1129 Type 2 diabetes mellitus with other diabetic kidney complication: Secondary | ICD-10-CM | POA: Diagnosis not present

## 2020-07-22 DIAGNOSIS — D472 Monoclonal gammopathy: Secondary | ICD-10-CM | POA: Diagnosis not present

## 2020-07-22 DIAGNOSIS — N2581 Secondary hyperparathyroidism of renal origin: Secondary | ICD-10-CM | POA: Diagnosis not present

## 2020-07-22 DIAGNOSIS — Z992 Dependence on renal dialysis: Secondary | ICD-10-CM | POA: Diagnosis not present

## 2020-07-22 DIAGNOSIS — E1129 Type 2 diabetes mellitus with other diabetic kidney complication: Secondary | ICD-10-CM | POA: Diagnosis not present

## 2020-07-22 DIAGNOSIS — N186 End stage renal disease: Secondary | ICD-10-CM | POA: Diagnosis not present

## 2020-07-25 DIAGNOSIS — N2581 Secondary hyperparathyroidism of renal origin: Secondary | ICD-10-CM | POA: Diagnosis not present

## 2020-07-25 DIAGNOSIS — N186 End stage renal disease: Secondary | ICD-10-CM | POA: Diagnosis not present

## 2020-07-25 DIAGNOSIS — Z992 Dependence on renal dialysis: Secondary | ICD-10-CM | POA: Diagnosis not present

## 2020-07-25 DIAGNOSIS — E1129 Type 2 diabetes mellitus with other diabetic kidney complication: Secondary | ICD-10-CM | POA: Diagnosis not present

## 2020-07-25 DIAGNOSIS — D472 Monoclonal gammopathy: Secondary | ICD-10-CM | POA: Diagnosis not present

## 2020-07-27 DIAGNOSIS — E1129 Type 2 diabetes mellitus with other diabetic kidney complication: Secondary | ICD-10-CM | POA: Diagnosis not present

## 2020-07-27 DIAGNOSIS — Z992 Dependence on renal dialysis: Secondary | ICD-10-CM | POA: Diagnosis not present

## 2020-07-27 DIAGNOSIS — D472 Monoclonal gammopathy: Secondary | ICD-10-CM | POA: Diagnosis not present

## 2020-07-27 DIAGNOSIS — N186 End stage renal disease: Secondary | ICD-10-CM | POA: Diagnosis not present

## 2020-07-27 DIAGNOSIS — N2581 Secondary hyperparathyroidism of renal origin: Secondary | ICD-10-CM | POA: Diagnosis not present

## 2020-07-29 DIAGNOSIS — N2581 Secondary hyperparathyroidism of renal origin: Secondary | ICD-10-CM | POA: Diagnosis not present

## 2020-07-29 DIAGNOSIS — D472 Monoclonal gammopathy: Secondary | ICD-10-CM | POA: Diagnosis not present

## 2020-07-29 DIAGNOSIS — Z992 Dependence on renal dialysis: Secondary | ICD-10-CM | POA: Diagnosis not present

## 2020-07-29 DIAGNOSIS — E1129 Type 2 diabetes mellitus with other diabetic kidney complication: Secondary | ICD-10-CM | POA: Diagnosis not present

## 2020-07-29 DIAGNOSIS — N186 End stage renal disease: Secondary | ICD-10-CM | POA: Diagnosis not present

## 2020-08-01 DIAGNOSIS — Z992 Dependence on renal dialysis: Secondary | ICD-10-CM | POA: Diagnosis not present

## 2020-08-01 DIAGNOSIS — E1129 Type 2 diabetes mellitus with other diabetic kidney complication: Secondary | ICD-10-CM | POA: Diagnosis not present

## 2020-08-01 DIAGNOSIS — N186 End stage renal disease: Secondary | ICD-10-CM | POA: Diagnosis not present

## 2020-08-01 DIAGNOSIS — N2581 Secondary hyperparathyroidism of renal origin: Secondary | ICD-10-CM | POA: Diagnosis not present

## 2020-08-01 DIAGNOSIS — D472 Monoclonal gammopathy: Secondary | ICD-10-CM | POA: Diagnosis not present

## 2020-08-03 ENCOUNTER — Ambulatory Visit: Payer: Medicare Other | Admitting: Nurse Practitioner

## 2020-08-03 DIAGNOSIS — N2581 Secondary hyperparathyroidism of renal origin: Secondary | ICD-10-CM | POA: Diagnosis not present

## 2020-08-03 DIAGNOSIS — E1129 Type 2 diabetes mellitus with other diabetic kidney complication: Secondary | ICD-10-CM | POA: Diagnosis not present

## 2020-08-03 DIAGNOSIS — N186 End stage renal disease: Secondary | ICD-10-CM | POA: Diagnosis not present

## 2020-08-03 DIAGNOSIS — Z992 Dependence on renal dialysis: Secondary | ICD-10-CM | POA: Diagnosis not present

## 2020-08-03 DIAGNOSIS — D472 Monoclonal gammopathy: Secondary | ICD-10-CM | POA: Diagnosis not present

## 2020-08-05 DIAGNOSIS — N2581 Secondary hyperparathyroidism of renal origin: Secondary | ICD-10-CM | POA: Diagnosis not present

## 2020-08-05 DIAGNOSIS — E1129 Type 2 diabetes mellitus with other diabetic kidney complication: Secondary | ICD-10-CM | POA: Diagnosis not present

## 2020-08-05 DIAGNOSIS — Z992 Dependence on renal dialysis: Secondary | ICD-10-CM | POA: Diagnosis not present

## 2020-08-05 DIAGNOSIS — D472 Monoclonal gammopathy: Secondary | ICD-10-CM | POA: Diagnosis not present

## 2020-08-05 DIAGNOSIS — N186 End stage renal disease: Secondary | ICD-10-CM | POA: Diagnosis not present

## 2020-08-08 DIAGNOSIS — N2581 Secondary hyperparathyroidism of renal origin: Secondary | ICD-10-CM | POA: Diagnosis not present

## 2020-08-08 DIAGNOSIS — E1129 Type 2 diabetes mellitus with other diabetic kidney complication: Secondary | ICD-10-CM | POA: Diagnosis not present

## 2020-08-08 DIAGNOSIS — D472 Monoclonal gammopathy: Secondary | ICD-10-CM | POA: Diagnosis not present

## 2020-08-08 DIAGNOSIS — N186 End stage renal disease: Secondary | ICD-10-CM | POA: Diagnosis not present

## 2020-08-08 DIAGNOSIS — Z992 Dependence on renal dialysis: Secondary | ICD-10-CM | POA: Diagnosis not present

## 2020-08-10 DIAGNOSIS — Z992 Dependence on renal dialysis: Secondary | ICD-10-CM | POA: Diagnosis not present

## 2020-08-10 DIAGNOSIS — E1129 Type 2 diabetes mellitus with other diabetic kidney complication: Secondary | ICD-10-CM | POA: Diagnosis not present

## 2020-08-10 DIAGNOSIS — D472 Monoclonal gammopathy: Secondary | ICD-10-CM | POA: Diagnosis not present

## 2020-08-10 DIAGNOSIS — N186 End stage renal disease: Secondary | ICD-10-CM | POA: Diagnosis not present

## 2020-08-10 DIAGNOSIS — N2581 Secondary hyperparathyroidism of renal origin: Secondary | ICD-10-CM | POA: Diagnosis not present

## 2020-08-12 DIAGNOSIS — D472 Monoclonal gammopathy: Secondary | ICD-10-CM | POA: Diagnosis not present

## 2020-08-12 DIAGNOSIS — N2581 Secondary hyperparathyroidism of renal origin: Secondary | ICD-10-CM | POA: Diagnosis not present

## 2020-08-12 DIAGNOSIS — E1122 Type 2 diabetes mellitus with diabetic chronic kidney disease: Secondary | ICD-10-CM | POA: Diagnosis not present

## 2020-08-12 DIAGNOSIS — N186 End stage renal disease: Secondary | ICD-10-CM | POA: Diagnosis not present

## 2020-08-12 DIAGNOSIS — E1129 Type 2 diabetes mellitus with other diabetic kidney complication: Secondary | ICD-10-CM | POA: Diagnosis not present

## 2020-08-12 DIAGNOSIS — Z992 Dependence on renal dialysis: Secondary | ICD-10-CM | POA: Diagnosis not present

## 2020-08-15 DIAGNOSIS — N2581 Secondary hyperparathyroidism of renal origin: Secondary | ICD-10-CM | POA: Diagnosis not present

## 2020-08-15 DIAGNOSIS — Z992 Dependence on renal dialysis: Secondary | ICD-10-CM | POA: Diagnosis not present

## 2020-08-15 DIAGNOSIS — D472 Monoclonal gammopathy: Secondary | ICD-10-CM | POA: Diagnosis not present

## 2020-08-15 DIAGNOSIS — E1129 Type 2 diabetes mellitus with other diabetic kidney complication: Secondary | ICD-10-CM | POA: Diagnosis not present

## 2020-08-15 DIAGNOSIS — N186 End stage renal disease: Secondary | ICD-10-CM | POA: Diagnosis not present

## 2020-08-16 DIAGNOSIS — D472 Monoclonal gammopathy: Secondary | ICD-10-CM | POA: Diagnosis not present

## 2020-08-16 DIAGNOSIS — N186 End stage renal disease: Secondary | ICD-10-CM | POA: Diagnosis not present

## 2020-08-16 DIAGNOSIS — E1129 Type 2 diabetes mellitus with other diabetic kidney complication: Secondary | ICD-10-CM | POA: Diagnosis not present

## 2020-08-16 DIAGNOSIS — Z992 Dependence on renal dialysis: Secondary | ICD-10-CM | POA: Diagnosis not present

## 2020-08-16 DIAGNOSIS — N2581 Secondary hyperparathyroidism of renal origin: Secondary | ICD-10-CM | POA: Diagnosis not present

## 2020-08-19 DIAGNOSIS — N186 End stage renal disease: Secondary | ICD-10-CM | POA: Diagnosis not present

## 2020-08-19 DIAGNOSIS — Z992 Dependence on renal dialysis: Secondary | ICD-10-CM | POA: Diagnosis not present

## 2020-08-22 ENCOUNTER — Ambulatory Visit: Payer: Medicare Other | Admitting: Nurse Practitioner

## 2020-08-22 DIAGNOSIS — Z992 Dependence on renal dialysis: Secondary | ICD-10-CM | POA: Diagnosis not present

## 2020-08-22 DIAGNOSIS — N186 End stage renal disease: Secondary | ICD-10-CM | POA: Diagnosis not present

## 2020-08-22 DIAGNOSIS — E1129 Type 2 diabetes mellitus with other diabetic kidney complication: Secondary | ICD-10-CM | POA: Diagnosis not present

## 2020-08-22 DIAGNOSIS — D472 Monoclonal gammopathy: Secondary | ICD-10-CM | POA: Diagnosis not present

## 2020-08-22 DIAGNOSIS — N2581 Secondary hyperparathyroidism of renal origin: Secondary | ICD-10-CM | POA: Diagnosis not present

## 2020-08-24 DIAGNOSIS — N2581 Secondary hyperparathyroidism of renal origin: Secondary | ICD-10-CM | POA: Diagnosis not present

## 2020-08-24 DIAGNOSIS — N186 End stage renal disease: Secondary | ICD-10-CM | POA: Diagnosis not present

## 2020-08-24 DIAGNOSIS — D472 Monoclonal gammopathy: Secondary | ICD-10-CM | POA: Diagnosis not present

## 2020-08-24 DIAGNOSIS — Z992 Dependence on renal dialysis: Secondary | ICD-10-CM | POA: Diagnosis not present

## 2020-08-24 DIAGNOSIS — E1129 Type 2 diabetes mellitus with other diabetic kidney complication: Secondary | ICD-10-CM | POA: Diagnosis not present

## 2020-08-26 DIAGNOSIS — Z992 Dependence on renal dialysis: Secondary | ICD-10-CM | POA: Diagnosis not present

## 2020-08-26 DIAGNOSIS — N2581 Secondary hyperparathyroidism of renal origin: Secondary | ICD-10-CM | POA: Diagnosis not present

## 2020-08-26 DIAGNOSIS — N186 End stage renal disease: Secondary | ICD-10-CM | POA: Diagnosis not present

## 2020-08-26 DIAGNOSIS — D472 Monoclonal gammopathy: Secondary | ICD-10-CM | POA: Diagnosis not present

## 2020-08-26 DIAGNOSIS — E1129 Type 2 diabetes mellitus with other diabetic kidney complication: Secondary | ICD-10-CM | POA: Diagnosis not present

## 2020-08-29 DIAGNOSIS — N2581 Secondary hyperparathyroidism of renal origin: Secondary | ICD-10-CM | POA: Diagnosis not present

## 2020-08-29 DIAGNOSIS — D472 Monoclonal gammopathy: Secondary | ICD-10-CM | POA: Diagnosis not present

## 2020-08-29 DIAGNOSIS — Z992 Dependence on renal dialysis: Secondary | ICD-10-CM | POA: Diagnosis not present

## 2020-08-29 DIAGNOSIS — N186 End stage renal disease: Secondary | ICD-10-CM | POA: Diagnosis not present

## 2020-08-29 DIAGNOSIS — E1129 Type 2 diabetes mellitus with other diabetic kidney complication: Secondary | ICD-10-CM | POA: Diagnosis not present

## 2020-08-31 DIAGNOSIS — N186 End stage renal disease: Secondary | ICD-10-CM | POA: Diagnosis not present

## 2020-08-31 DIAGNOSIS — E039 Hypothyroidism, unspecified: Secondary | ICD-10-CM | POA: Diagnosis not present

## 2020-08-31 DIAGNOSIS — N2581 Secondary hyperparathyroidism of renal origin: Secondary | ICD-10-CM | POA: Diagnosis not present

## 2020-08-31 DIAGNOSIS — D472 Monoclonal gammopathy: Secondary | ICD-10-CM | POA: Diagnosis not present

## 2020-08-31 DIAGNOSIS — E1129 Type 2 diabetes mellitus with other diabetic kidney complication: Secondary | ICD-10-CM | POA: Diagnosis not present

## 2020-08-31 DIAGNOSIS — Z992 Dependence on renal dialysis: Secondary | ICD-10-CM | POA: Diagnosis not present

## 2020-09-02 DIAGNOSIS — D472 Monoclonal gammopathy: Secondary | ICD-10-CM | POA: Diagnosis not present

## 2020-09-02 DIAGNOSIS — E1129 Type 2 diabetes mellitus with other diabetic kidney complication: Secondary | ICD-10-CM | POA: Diagnosis not present

## 2020-09-02 DIAGNOSIS — Z992 Dependence on renal dialysis: Secondary | ICD-10-CM | POA: Diagnosis not present

## 2020-09-02 DIAGNOSIS — N2581 Secondary hyperparathyroidism of renal origin: Secondary | ICD-10-CM | POA: Diagnosis not present

## 2020-09-02 DIAGNOSIS — N186 End stage renal disease: Secondary | ICD-10-CM | POA: Diagnosis not present

## 2020-09-05 DIAGNOSIS — N2581 Secondary hyperparathyroidism of renal origin: Secondary | ICD-10-CM | POA: Diagnosis not present

## 2020-09-05 DIAGNOSIS — Z992 Dependence on renal dialysis: Secondary | ICD-10-CM | POA: Diagnosis not present

## 2020-09-05 DIAGNOSIS — E1129 Type 2 diabetes mellitus with other diabetic kidney complication: Secondary | ICD-10-CM | POA: Diagnosis not present

## 2020-09-05 DIAGNOSIS — D472 Monoclonal gammopathy: Secondary | ICD-10-CM | POA: Diagnosis not present

## 2020-09-05 DIAGNOSIS — N186 End stage renal disease: Secondary | ICD-10-CM | POA: Diagnosis not present

## 2020-09-06 DIAGNOSIS — I871 Compression of vein: Secondary | ICD-10-CM | POA: Diagnosis not present

## 2020-09-06 DIAGNOSIS — Z992 Dependence on renal dialysis: Secondary | ICD-10-CM | POA: Diagnosis not present

## 2020-09-06 DIAGNOSIS — T82858A Stenosis of vascular prosthetic devices, implants and grafts, initial encounter: Secondary | ICD-10-CM | POA: Diagnosis not present

## 2020-09-06 DIAGNOSIS — N186 End stage renal disease: Secondary | ICD-10-CM | POA: Diagnosis not present

## 2020-09-07 DIAGNOSIS — D472 Monoclonal gammopathy: Secondary | ICD-10-CM | POA: Diagnosis not present

## 2020-09-07 DIAGNOSIS — Z992 Dependence on renal dialysis: Secondary | ICD-10-CM | POA: Diagnosis not present

## 2020-09-07 DIAGNOSIS — N186 End stage renal disease: Secondary | ICD-10-CM | POA: Diagnosis not present

## 2020-09-07 DIAGNOSIS — E1129 Type 2 diabetes mellitus with other diabetic kidney complication: Secondary | ICD-10-CM | POA: Diagnosis not present

## 2020-09-07 DIAGNOSIS — N2581 Secondary hyperparathyroidism of renal origin: Secondary | ICD-10-CM | POA: Diagnosis not present

## 2020-09-09 DIAGNOSIS — Z992 Dependence on renal dialysis: Secondary | ICD-10-CM | POA: Diagnosis not present

## 2020-09-09 DIAGNOSIS — N2581 Secondary hyperparathyroidism of renal origin: Secondary | ICD-10-CM | POA: Diagnosis not present

## 2020-09-09 DIAGNOSIS — D472 Monoclonal gammopathy: Secondary | ICD-10-CM | POA: Diagnosis not present

## 2020-09-09 DIAGNOSIS — N186 End stage renal disease: Secondary | ICD-10-CM | POA: Diagnosis not present

## 2020-09-09 DIAGNOSIS — E1129 Type 2 diabetes mellitus with other diabetic kidney complication: Secondary | ICD-10-CM | POA: Diagnosis not present

## 2020-09-11 ENCOUNTER — Other Ambulatory Visit: Payer: Self-pay | Admitting: Nurse Practitioner

## 2020-09-11 DIAGNOSIS — IMO0002 Reserved for concepts with insufficient information to code with codable children: Secondary | ICD-10-CM

## 2020-09-11 DIAGNOSIS — E1129 Type 2 diabetes mellitus with other diabetic kidney complication: Secondary | ICD-10-CM

## 2020-09-12 ENCOUNTER — Ambulatory Visit (INDEPENDENT_AMBULATORY_CARE_PROVIDER_SITE_OTHER): Payer: Medicare Other | Admitting: Ophthalmology

## 2020-09-12 ENCOUNTER — Other Ambulatory Visit: Payer: Self-pay

## 2020-09-12 ENCOUNTER — Encounter (INDEPENDENT_AMBULATORY_CARE_PROVIDER_SITE_OTHER): Payer: Self-pay | Admitting: Ophthalmology

## 2020-09-12 DIAGNOSIS — E113512 Type 2 diabetes mellitus with proliferative diabetic retinopathy with macular edema, left eye: Secondary | ICD-10-CM

## 2020-09-12 DIAGNOSIS — E113511 Type 2 diabetes mellitus with proliferative diabetic retinopathy with macular edema, right eye: Secondary | ICD-10-CM

## 2020-09-12 DIAGNOSIS — H4312 Vitreous hemorrhage, left eye: Secondary | ICD-10-CM

## 2020-09-12 DIAGNOSIS — E113551 Type 2 diabetes mellitus with stable proliferative diabetic retinopathy, right eye: Secondary | ICD-10-CM

## 2020-09-12 DIAGNOSIS — D472 Monoclonal gammopathy: Secondary | ICD-10-CM | POA: Diagnosis not present

## 2020-09-12 DIAGNOSIS — Z992 Dependence on renal dialysis: Secondary | ICD-10-CM | POA: Diagnosis not present

## 2020-09-12 DIAGNOSIS — N186 End stage renal disease: Secondary | ICD-10-CM | POA: Diagnosis not present

## 2020-09-12 DIAGNOSIS — N2581 Secondary hyperparathyroidism of renal origin: Secondary | ICD-10-CM | POA: Diagnosis not present

## 2020-09-12 NOTE — Assessment & Plan Note (Signed)
Overall vastly improved, cleared hemorrhage, noncentral involved CSME has developed superotemporal.  Will need antivegF in the near future followed soon thereafter by focal laser treatment in this region

## 2020-09-12 NOTE — Progress Notes (Signed)
09/12/2020     CHIEF COMPLAINT Patient presents for Post-op Follow-up (8 week PO OS oct, sx 07/06/20/Patient states vision is stable and unchanged since last visit. Denies any new floaters or FOL./Patient states his last A1C was 7.0 and BS 117./)   HISTORY OF PRESENT ILLNESS: Henry Carroll is a 70 y.o. male who presents to the clinic today for:   HPI     Post-op Follow-up           Laterality: left eye   Discomfort: Negative for pain and itching   Vision: is improved   Comments: 8 week PO OS oct, sx 07/06/20 Patient states vision is stable and unchanged since last visit. Denies any new floaters or FOL. Patient states his last A1C was 7.0 and BS 117.        Last edited by Nelva Nay, COA on 09/12/2020  1:31 PM.      Referring physician: Sharon Seller, NP [address] [place],  [place] [address]  HISTORICAL INFORMATION:   Selected notes from the MEDICAL RECORD NUMBER    Lab Results  Component Value Date   HGBA1C 7.0 03/03/2020     CURRENT MEDICATIONS: No current outpatient medications on file. (Ophthalmic Drugs)   No current facility-administered medications for this visit. (Ophthalmic Drugs)   Current Outpatient Medications (Other)  Medication Sig   acetaminophen (TYLENOL) 500 MG tablet Take 1,000 mg by mouth as needed.   ADVAIR DISKUS 100-50 MCG/DOSE AEPB SMARTSIG:1 Puff(s) Via Inhaler Twice Daily PRN   amLODipine (NORVASC) 10 MG tablet Take 1 tablet by mouth once daily   aspirin EC 81 MG tablet Take 1 tablet (81 mg total) by mouth daily.   atorvastatin (LIPITOR) 40 MG tablet TAKE 1 TABLET BY MOUTH ONCE DAILY IN THE EVENING   carvedilol (COREG) 12.5 MG tablet Take 12.5 mg by mouth daily.    Cholecalciferol (VITAMIN D3) 25 MCG (1000 UT) CAPS Take 2 capsules by mouth daily.   Cinnamon 500 MG capsule Take 1,000 mg by mouth daily.    Coenzyme Q10 10 MG capsule Take 10 mg by mouth daily.   epoetin alfa-epbx (RETACRIT) 72785 UNIT/ML injection 20,000  Units every 14 (fourteen) days.    ezetimibe (ZETIA) 10 MG tablet Take 1 tablet (10 mg total) by mouth daily.   fluticasone (FLONASE) 50 MCG/ACT nasal spray Place 2 sprays into both nostrils daily as needed for allergies or rhinitis (SEASONAL ALLERGIES).   furosemide (LASIX) 40 MG tablet Take 40 mg by mouth. On S-M-W-F Takes  1 tablet by mouth once d   glucose blood (ONE TOUCH ULTRA TEST) test strip Use to test blood sugar three times daily. Dx: E11.21   insulin detemir (LEVEMIR) 100 UNIT/ML injection Inject 0.18 mLs (18 Units total) into the skin daily. APPOINTMENT OVERDUE   isosorbide dinitrate (ISORDIL) 10 MG tablet Take by mouth.   montelukast (SINGULAIR) 10 MG tablet TAKE 1 TABLET BY MOUTH AT BEDTIME   Multiple Vitamin (MULTIVITAMIN WITH MINERALS) TABS tablet Take 1 tablet by mouth daily.   nitroGLYCERIN (NITROSTAT) 0.4 MG SL tablet Place 1 tablet (0.4 mg total) under the tongue every 5 (five) minutes as needed for chest pain.   PROAIR HFA 108 (90 Base) MCG/ACT inhaler INHALE 2 PUFFS BY MOUTH EVERY 6 HOURS AS NEEDED FOR SHORTNESS OF BREATH   vitamin E 45 MG (100 UNITS) capsule Take by mouth.   No current facility-administered medications for this visit. 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 12/31/14: 2D ECHO: EF 40-45%, HK of inf myocardium, G1DD, mod MR   Chronic combined systolic and diastolic CHF (congestive heart failure) ([place]) 34/04/5246   Chronic systolic heart failure ([place]) 02/25/2015   CKD (chronic kidney disease), stage III (HCC)    stage 3 kidney disease   CKD (chronic kidney disease), stage V ([place]) 08/30/2018   COLONIC POLYPS, HX OF 04/13/2009   Qualifier: Diagnosis of  By: Westly Pam.    Coronary artery disease    a. LHC 01/2015 - triple vessel CAD (mod oLM, mLAD, severe mRCA, intermediate branch stenosis, CTO of mCx). Plan CABG 02/2015.   Demand ischemia ([place]) 12/31/2014   DM type 2, uncontrolled, with renal complications ([place]) 1/85/9093   Dyspnea     due to fluid   Elevated troponin    Essential hypertension 02/23/1622   Folliculitis of perineum 10/01/2012   Gastric erosion    GERD (gastroesophageal reflux disease)    History of blood transfusion    Hyperlipidemia    Hyperlipidemia with target LDL less than 100 12/31/2014   Hypertension    Hypertensive heart disease with heart failure (HCC) 02/25/2015   IDA (iron deficiency anemia) 01/23/2016   Melena 01/23/2016   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   Mitral regurgitation    a. Mild-mod by echo 12/2014.   Morbid obesity ([place]) 12/29/2014   Myocardial infarction [facility])    pt. states per [name] he has in the past   NSVT (nonsustained ventricular tachycardia) ([place])    a. 9 beats during 01/2015 adm. BB titrated.   Nuclear sclerotic cataract of left eye 05/26/2019   Nuclear sclerotic cataract of right eye 05/26/2019   Obesity    a. BMI 33   Obesity (BMI 30-39.9) 05/05/2013   OSA (obstructive sleep apnea) 01/16/2018   Mild obstructive sleep apnea overall with an AHI of 7.3/h and no significant central sleep apnea. Severe obstructive sleep apnea during REM sleep with an AHI of 34.3/h.  Now on CPAP at 6 cm H2O.    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   Type II diabetes mellitus ([place])    Vitreous hemorrhage of right eye ([place]) 05/26/2019   Wears dentures    Past Surgical History:  Procedure Laterality Date   BACK SURGERY     BASCILIC VEIN TRANSPOSITION Left 11/14/2018   Procedure: FIRST STAGE BASILIC VEIN TRANSPOSITION LEFT ARM;  Surgeon: Serafina Mitchell, MD;  Location: [facility];  Service: Vascular;  Laterality: Left;   BASCILIC VEIN TRANSPOSITION Left 01/28/2019   Procedure: BASILIC VEIN TRANSPOSITION SECOND STAGE;  Surgeon: Serafina Mitchell, MD;  Location: [facility] OR;  Service: Vascular;  Laterality: Left;   BONE MARROW BIOPSY     CARDIAC CATHETERIZATION N/A 02/09/2015   Procedure: Left Heart Cath and Coronary Angiography;  Surgeon: Burnell Blanks, MD;  Location: [facility];  Service: Cardiovascular;  Laterality: N/A;   CATARACT EXTRACTION  06/11/2020   COLONOSCOPY  2011   Dr. [name]: Ileocecal valve appeared normal, scattered pancolonic diverticulosis, difficult bowel prep making smaller lesions potentially missed. Recommended three-year follow-up colonoscopy.   COLONOSCOPY  2003   Dr. [name]: Suspicious lesion at the ileocecal valve, multiple biopsies benign, pancolonic diverticulosis.   COLONOSCOPY N/A 02/15/2016   diverticulosis in sigmoid and descending colon, single 5 mm polyp at splenic flexure (Tubular adenoma)   CORONARY ARTERY BYPASS GRAFT N/A 02/18/2015   Procedure: CORONARY ARTERY BYPASS GRAFTING (CABG) x  four, using bilateral internal mammary arteries and right leg greater saphenous vein harvested endoscopically;  Surgeon: [name], MD;  Location: [facility];  Service: Open Heart Surgery;  Laterality: N/A;   ESOPHAGOGASTRODUODENOSCOPY N/A 02/15/2016   normal   EYE  SURGERY Right    July 2019    GIVENS CAPSULE STUDY N/A 01/08/2017   couple of gastric and small bowel eroions in setting of aspirin 81 mg daily but nothing concerning, continue Hematology follow-up   HERNIA REPAIR  5364   Umbilical   LUMBAR [place]  2006   L4 & L5   REFRACTIVE SURGERY Left    2019    REFRACTIVE SURGERY Right    2019   TEE WITHOUT CARDIOVERSION N/A 02/18/2015   Procedure: TRANSESOPHAGEAL ECHOCARDIOGRAM (TEE);  Surgeon: [name], MD;  Location: [facility];  Service: Open Heart Surgery;  Laterality: N/A;   TONSILLECTOMY  1962    FAMILY HISTORY Family History  Problem Relation Age of Onset   Diabetes Mother    Heart disease Mother        later in life, age >15   Heart disease Father        heart failure later in life   Hypertension Father    Stroke Sister    Kidney disease Daughter    Sudden death Daughter    Colon cancer Paternal Uncle        Passed from colon CA   Heart attack Neg Hx     SOCIAL HISTORY Social History   Tobacco Use   Smoking status: Former    Types: Cigars    Quit date: 12/31/2014    Years since quitting: 5.7   Smokeless tobacco: Never  Vaping Use   Vaping Use: Never used  Substance Use Topics   Alcohol use: Yes    Alcohol/week: 3.0 standard drinks    Types: 3 Glasses of wine per week    Comment: occasional glass of wine.   Drug use: No         OPHTHALMIC EXAM:  Base Eye Exam     Visual Acuity (ETDRS)       Right Left   Dist [place] 20/60 20/25   Dist ph [place] NI          Tonometry (Tonopen, 1:34 PM)       Right Left   Pressure 13 13         Pupils       Pupils Dark Light Shape React APD  Right PERRL 4 3 Round Brisk None   Left PERRL 4 3 Round Brisk None         Visual Fields (Counting fingers)       Left Right    Full Full         Neuro/Psych     Oriented x3: Yes   Mood/Affect: Normal         Dilation     Left eye: 1.0% Mydriacyl, 2.5% Phenylephrine @ 1:34 PM           Slit Lamp  and Fundus Exam     External Exam       Right Left   External Normal Normal         Slit Lamp Exam       Right Left   Lids/Lashes Normal Normal   Conjunctiva/Sclera White and quiet White and quiet   Cornea Clear Clear   Anterior Chamber Deep and quiet Deep and quiet   Iris Round and reactive Round and reactive   Lens Centered posterior chamber intraocular lens Centered posterior chamber intraocular lens   Anterior Vitreous Normal Normal         Fundus Exam       Right Left   Posterior Vitreous Clear, avitric Clear avitric   Disc Normal Normal   C/D Ratio 0.25 0.4   Macula Macular thickening,  Mild clinically significant macular edema, Exudates, Microaneurysms Microaneurysms, Clinically significant macular edema, Mild clinically significant macular edema   Vessels Quiet proliferative diabetic retinopathy PDR-quiet   Periphery Laser scars, good PRP Good PRP fresh 360            IMAGING AND PROCEDURES  Imaging and Procedures for 09/12/20  OCT, Retina - OU - Both Eyes       Right Eye Quality was good. Scan locations included subfoveal. Central Foveal Thickness: 452. Progression has improved. Findings include abnormal foveal contour, cystoid macular edema.   Left Eye Quality was good. Scan locations included subfoveal. Central Foveal Thickness: 303. Progression has worsened. Findings include abnormal foveal contour, cystoid macular edema.   Notes OS, not center involved but CSME present superotemporally, recurrent will need intravitreal antivegF followed thereafter by focal to this area  OD with clear media yet with massive CSME recurrent.  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  Will need antivegF in the  near future followed soon thereafter by focal laser treatment in this region  Stable treated proliferative diabetic retinopathy of right eye determined by examination associated with type 2 diabetes mellitus [facility]) Center threatening CSME continues with slow improvement in the right eye we will continue to observe at this time  Vitreous hemorrhage of left eye ([place]) Cleared post vitrectomy     ICD-10-CM   1. 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 Diabetic macular edema of right eye with proliferative retinopathy associated with type 2 diabetes mellitus (HCC)  E11.3511 OCT, Retina - OU - Both Eyes    3. Stable treated proliferative diabetic retinopathy of right eye determined by examination associated with type 2 diabetes mellitus ([place])  U82.8003     4. Vitreous hemorrhage  of left eye ([place])  H43.12       1.  OS vastly improved PDR and less active PDR and clearance of vitreous hemorrhage some 8 to 9 weeks post vitrectomy OS with excellent acuity.  2.  New onset center involved CSME has developed however.  This will require  3.  Center involved CSME in the right eye is continued to improve over time.  We will continue to monitor and observe  Ophthalmic Meds Ordered this visit:  No orders of the defined types were placed in this encounter.      Return in about 1 month (around 10/13/2020) for dilate, OS, AVASTIN OCT.  There are no Patient Instructions on file for this visit.   Explained the diagnoses, plan, and follow up with the patient and they expressed understanding.  Patient expressed understanding of the importance of proper follow up care.   Clent Demark Leiann Sporer M.D. Diseases & Surgery of the Retina and Vitreous Retina & Diabetic [place] 09/12/20     Abbreviations: M myopia (nearsighted); A astigmatism; H hyperopia (farsighted); P presbyopia; Mrx spectacle  prescription;  CTL contact lenses; OD right eye; OS left eye; OU both eyes  XT exotropia; ET esotropia; PEK punctate epithelial keratitis; PEE punctate epithelial erosions; DES dry eye syndrome; MGD meibomian gland dysfunction; ATs artificial tears; PFAT's preservative free artificial tears; [place] nuclear sclerotic cataract; PSC posterior subcapsular cataract; ERM epi-retinal membrane; PVD posterior vitreous detachment; RD retinal detachment; DM diabetes mellitus; DR diabetic retinopathy; NPDR non-proliferative diabetic retinopathy; PDR proliferative diabetic retinopathy; CSME clinically significant macular edema; DME diabetic macular edema; dbh dot blot hemorrhages; CWS cotton wool spot; POAG primary open angle glaucoma; C/D cup-to-disc ratio; HVF humphrey visual field; GVF goldmann visual field; OCT optical coherence tomography; IOP intraocular pressure; BRVO Branch retinal vein occlusion; CRVO central retinal vein occlusion; CRAO central retinal artery occlusion; BRAO branch retinal artery occlusion; RT retinal tear; SB scleral buckle; PPV pars plana vitrectomy; VH Vitreous hemorrhage; PRP panretinal laser photocoagulation; IVK intravitreal kenalog; VMT vitreomacular traction; MH Macular hole;  NVD neovascularization of the disc; NVE neovascularization elsewhere; AREDS age related eye disease study; ARMD age related macular degeneration; POAG primary open angle glaucoma; EBMD epithelial/anterior basement membrane dystrophy; ACIOL anterior chamber intraocular lens; IOL intraocular lens; PCIOL posterior chamber intraocular lens; Phaco/IOL phacoemulsification with intraocular lens placement; [place] photorefractive keratectomy; LASIK laser assisted in situ keratomileusis; HTN hypertension; DM diabetes mellitus; COPD chronic obstructive pulmonary disease

## 2020-09-12 NOTE — Assessment & Plan Note (Signed)
Cleared post vitrectomy

## 2020-09-12 NOTE — Telephone Encounter (Signed)
LAst A1c, 6 months ago and no pending appointment to recheck. Patient had an appointmetn on 08/22/2020 and no showed.   I called patient x 2, phone rung and it appeared that someone had answered and hung up, yet there could be a malfunction with the phone line. RX will be approved with the indication appointment due

## 2020-09-12 NOTE — Assessment & Plan Note (Signed)
Center threatening CSME continues with slow improvement in the right eye we will continue to observe at this time

## 2020-09-13 ENCOUNTER — Telehealth: Payer: Self-pay | Admitting: Cardiology

## 2020-09-13 ENCOUNTER — Other Ambulatory Visit: Payer: Self-pay | Admitting: *Deleted

## 2020-09-13 MED ORDER — NITROGLYCERIN 0.4 MG SL SUBL: 0.4000 mg | SUBLINGUAL_TABLET | SUBLINGUAL | 3 refills | Status: AC | PRN

## 2020-09-13 NOTE — Telephone Encounter (Signed)
*  STAT* If patient is at the pharmacy, call can be transferred to refill team.   1. Which medications need to be refilled? (please list name of each medication and dose if known)  nitroGLYCERIN (NITROSTAT) 0.4 MG SL tablet 2. Which pharmacy/location (including street and city if local pharmacy) is medication to be sent to? [place], [place] [address]  3. Do they need a 30 day or 90 day supply? Needs some for trip to [place] prescription he has is expired

## 2020-09-14 ENCOUNTER — Other Ambulatory Visit: Payer: Self-pay

## 2020-09-14 ENCOUNTER — Encounter: Payer: Self-pay | Admitting: Nurse Practitioner

## 2020-09-14 ENCOUNTER — Ambulatory Visit (INDEPENDENT_AMBULATORY_CARE_PROVIDER_SITE_OTHER): Payer: Medicare Other | Admitting: Nurse Practitioner

## 2020-09-14 VITALS — BP 138/76 | HR 54 | Temp 97.9°F | Ht 74.0 in | Wt 202.0 lb

## 2020-09-14 DIAGNOSIS — E1129 Type 2 diabetes mellitus with other diabetic kidney complication: Secondary | ICD-10-CM | POA: Diagnosis not present

## 2020-09-14 DIAGNOSIS — I5042 Chronic combined systolic (congestive) and diastolic (congestive) heart failure: Secondary | ICD-10-CM | POA: Diagnosis not present

## 2020-09-14 DIAGNOSIS — E1165 Type 2 diabetes mellitus with hyperglycemia: Secondary | ICD-10-CM | POA: Diagnosis not present

## 2020-09-14 DIAGNOSIS — N2581 Secondary hyperparathyroidism of renal origin: Secondary | ICD-10-CM | POA: Diagnosis not present

## 2020-09-14 DIAGNOSIS — N185 Chronic kidney disease, stage 5: Secondary | ICD-10-CM

## 2020-09-14 DIAGNOSIS — D472 Monoclonal gammopathy: Secondary | ICD-10-CM | POA: Diagnosis not present

## 2020-09-14 DIAGNOSIS — I2583 Coronary atherosclerosis due to lipid rich plaque: Secondary | ICD-10-CM

## 2020-09-14 DIAGNOSIS — I2510 Atherosclerotic heart disease of native coronary artery without angina pectoris: Secondary | ICD-10-CM

## 2020-09-14 DIAGNOSIS — E785 Hyperlipidemia, unspecified: Secondary | ICD-10-CM | POA: Diagnosis not present

## 2020-09-14 DIAGNOSIS — IMO0002 Reserved for concepts with insufficient information to code with codable children: Secondary | ICD-10-CM

## 2020-09-14 DIAGNOSIS — R600 Localized edema: Secondary | ICD-10-CM

## 2020-09-14 DIAGNOSIS — N186 End stage renal disease: Secondary | ICD-10-CM | POA: Diagnosis not present

## 2020-09-14 DIAGNOSIS — J45909 Unspecified asthma, uncomplicated: Secondary | ICD-10-CM | POA: Diagnosis not present

## 2020-09-14 DIAGNOSIS — Z992 Dependence on renal dialysis: Secondary | ICD-10-CM | POA: Diagnosis not present

## 2020-09-14 MED ORDER — PROAIR HFA 108 (90 BASE) MCG/ACT IN AERS: INHALATION_SPRAY | RESPIRATORY_TRACT | 5 refills | Status: AC

## 2020-09-14 MED ORDER — FLUTICASONE-SALMETEROL 100-50 MCG/ACT IN AEPB: 1.0000 | INHALATION_SPRAY | Freq: Two times a day (BID) | RESPIRATORY_TRACT | 5 refills | Status: AC

## 2020-09-14 MED ORDER — ALBUTEROL SULFATE (2.5 MG/3ML) 0.083% IN NEBU: 2.5000 mg | INHALATION_SOLUTION | Freq: Four times a day (QID) | RESPIRATORY_TRACT | 5 refills | Status: AC | PRN

## 2020-09-14 NOTE — Patient Instructions (Addendum)
Schedule follow-up in 4 month

## 2020-09-14 NOTE — Progress Notes (Signed)
Careteam: Patient Care Team: Sharon Seller, NP as PCP - General (Geriatric Medicine) Jake Bathe, MD as PCP - Cardiology (Cardiology) Jena Gauss Gerrit Friends, MD as Consulting Physician (Gastroenterology) Luciana Axe Alford Highland, MD as Consulting Physician (Ophthalmology) Zetta Bills, MD as Consulting Physician (Nephrology) Doreatha Massed, MD as Consulting Physician (Hematology)  PLACE OF SERVICE:  [facility]  Advanced Directive information Does Patient Have a Medical Advance Directive?: Yes, Type of Advance Directive: Out of facility DNR (pink MOST or yellow form), Pre-existing out of facility DNR order (yellow form or pink MOST form): Pink MOST form placed in chart (order not valid for inpatient use), Does patient want to make changes to medical advance directive?: No - Patient declined  Not on File  Chief Complaint  Patient presents with   Medical Management of Chronic Issues    4 month follow-up and refill proair, advair and rx for albuterol nebulizer solution (patient states he has not had to use this in a long time and is not sure how it got removed from his medication list). Discuss need for hep c screening, covid #4, and A1c. Patient aware we do not have flu vaccine in stock and he can get at a local pharmacy or schedule a nurse visit for late August- early September.      HPI: Patient is a 70 y.o. male for routine follow up.  He is planning on going to [place] for a convention. Plans to use a wheelchair though the airport  DM- reports no low blood sugars.   COPD- overall stable. No increase in shortness of breath, cough or wheezing. Walking long distances does cause increase in shortness of breath. Continues on adviar   CHF- stable. No edema, continues on coreg and imdur No chest pains.   Hyperlipidemia- on zetia.    Review of Systems:  Review of Systems  Constitutional:  Negative for chills, fever and weight loss.  HENT:  Negative for tinnitus.   Respiratory:   Negative for cough, sputum production and shortness of breath.   Cardiovascular:  Negative for chest pain, palpitations and leg swelling.  Gastrointestinal:  Negative for abdominal pain, constipation, diarrhea and heartburn.  Genitourinary:  Negative for dysuria, frequency and urgency.  Musculoskeletal:  Negative for back pain, falls, joint pain and myalgias.  Skin: Negative.   Neurological:  Negative for dizziness and headaches.  Psychiatric/Behavioral:  Negative for depression and memory loss. The patient does not have insomnia.    Past Medical History:  Diagnosis Date   Abnormal stress test 02/10/2015   Acute on chronic systolic and diastolic heart failure, NYHA class 1 (HCC) 12/31/2014   Anemia    Anemia in chronic kidney disease 09/03/2012   Arthritis    left  5th finger   Asthma    Asthma, chronic 06/04/2012   Asthma, chronic, mild intermittent, with acute exacerbation 12/22/2015   Bilateral lower extremity edema 12/22/2015   CAD (coronary artery disease) 02/18/2015   Chronic combined systolic (congestive) and diastolic (congestive) heart failure (HCC)    a. 12/31/14: 2D ECHO: EF 40-45%, HK of inf myocardium, G1DD, mod MR   Chronic combined systolic and diastolic CHF (congestive heart failure) (HCC) 02/10/2015   Chronic systolic heart failure (HCC) 02/25/2015   CKD (chronic kidney disease), stage III (HCC)    stage 3 kidney disease   CKD (chronic kidney disease), stage V (HCC) 08/30/2018   COLONIC POLYPS, HX OF 04/13/2009   Qualifier: Diagnosis of  By: De Blanch.  Coronary artery disease    a. LHC 01/2015 - triple vessel CAD (mod oLM, mLAD, severe mRCA, intermediate branch stenosis, CTO of mCx). Plan CABG 02/2015.   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 Mild-mod by echo 12/2014.   Morbid obesity (HCC) 12/29/2014   Myocardial infarction [facility])    pt. states per Dr. Laneta Simmers he has in the past   NSVT (nonsustained ventricular tachycardia) (HCC)    a. 9 beats during 01/2015 adm. BB titrated.   Nuclear sclerotic cataract of left eye 05/26/2019   Nuclear sclerotic cataract of right eye 05/26/2019   Obesity    a. BMI 33   Obesity (BMI 30-39.9) 05/05/2013   OSA (obstructive sleep apnea) 01/16/2018   Mild obstructive sleep apnea overall with an AHI of 7.3/h and no significant central sleep apnea. Severe obstructive sleep apnea during REM sleep with an AHI of 34.3/h.  Now on CPAP at 6 cm H2O.    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   Type II diabetes mellitus (HCC)    Vitreous hemorrhage of right eye (HCC) 05/26/2019   Wears dentures    Past Surgical History:  Procedure Laterality Date   BACK SURGERY     BASCILIC VEIN TRANSPOSITION Left 11/14/2018   Procedure: FIRST STAGE BASILIC VEIN TRANSPOSITION LEFT ARM;  Surgeon: Nada Libman, MD;  Location: [facility] OR;  Service: Vascular;  Laterality: Left;   BASCILIC VEIN TRANSPOSITION Left 01/28/2019   Procedure: BASILIC VEIN TRANSPOSITION SECOND STAGE;  Surgeon: Nada Libman, MD;  Location: [facility] OR;  Service: Vascular;  Laterality: Left;   BONE MARROW BIOPSY     CARDIAC  CATHETERIZATION N/A 02/09/2015   Procedure: Left Heart Cath and Coronary Angiography;  Surgeon: Kathleene Hazel, MD;  Location: [facility] INVASIVE CV LAB;  Service: Cardiovascular;  Laterality: N/A;   CATARACT EXTRACTION Bilateral 06/11/2020   COLONOSCOPY  2011   Dr. Jena Gauss: Ileocecal valve appeared normal, scattered pancolonic diverticulosis, difficult bowel prep making smaller lesions potentially missed. Recommended three-year follow-up colonoscopy.   COLONOSCOPY  2003   Dr. Jena Gauss: Suspicious lesion at the ileocecal valve, multiple biopsies benign, pancolonic diverticulosis.   COLONOSCOPY N/A 02/15/2016   diverticulosis in sigmoid and descending colon, single 5 mm polyp at splenic flexure (Tubular adenoma)   CORONARY ARTERY BYPASS GRAFT N/A 02/18/2015   Procedure: CORONARY ARTERY BYPASS GRAFTING (CABG) x  four, using bilateral internal mammary arteries and right leg greater saphenous vein harvested endoscopically;  Surgeon: Alleen Borne, MD;  Location: [facility] OR;  Service: Open Heart Surgery;  Laterality: N/A;   ESOPHAGOGASTRODUODENOSCOPY N/A 02/15/2016   normal  EYE SURGERY Right    July 2019    EYE SURGERY Left 06/2020   GIVENS CAPSULE STUDY N/A 01/08/2017   couple of gastric and small bowel eroions in setting of aspirin 81 mg daily but nothing concerning, continue Hematology follow-up   HERNIA REPAIR  1990   Umbilical   LUMBAR DISC SURGERY  2006   L4 & L5   REFRACTIVE SURGERY Left    2019    REFRACTIVE SURGERY Right    2019   TEE WITHOUT CARDIOVERSION N/A 02/18/2015   Procedure: TRANSESOPHAGEAL ECHOCARDIOGRAM (TEE);  Surgeon: Alleen Borne, MD;  Location: [facility] OR;  Service: Open Heart Surgery;  Laterality: N/A;   TONSILLECTOMY  1962   Social History:   reports that he quit smoking about 5 years ago. His smoking use included cigars. He has never used smokeless tobacco. He reports previous alcohol use of about 3.0 standard drinks of alcohol per week. He reports that he does not use  drugs.  Family History  Problem Relation Age of Onset   Diabetes Mother    Heart disease Mother        later in life, age >69   Heart disease Father        heart failure later in life   Hypertension Father    Stroke Sister    Kidney disease Daughter    Sudden death Daughter    Colon cancer Paternal Uncle        Passed from colon CA   Heart attack Neg Hx     Medications: Patient's Medications  New Prescriptions   No medications on file  Previous Medications   ACETAMINOPHEN (TYLENOL) 500 MG TABLET    Take 1,000 mg by mouth as needed.   ADVAIR DISKUS 100-50 MCG/DOSE AEPB    SMARTSIG:1 Puff(s) Via Inhaler Twice Daily PRN   AMLODIPINE (NORVASC) 10 MG TABLET    Take 1 tablet by mouth once daily   ASPIRIN EC 81 MG TABLET    Take 1 tablet (81 mg total) by mouth daily.   ATORVASTATIN (LIPITOR) 40 MG TABLET    TAKE 1 TABLET BY MOUTH ONCE DAILY IN THE EVENING   CARVEDILOL (COREG) 12.5 MG TABLET    Take 12.5 mg by mouth daily.    CHOLECALCIFEROL (VITAMIN D3) 25 MCG (1000 UT) CAPS    Take 2 capsules by mouth daily.   CINNAMON 500 MG CAPSULE    Take 1,000 mg by mouth daily.    COENZYME Q10 10 MG CAPSULE    Take 10 mg by mouth daily.   EPOETIN ALFA-EPBX (RETACRIT) 58021 UNIT/ML INJECTION    20,000 Units every 14 (fourteen) days.    EZETIMIBE (ZETIA) 10 MG TABLET    Take 1 tablet (10 mg total) by mouth daily.   FLUTICASONE (FLONASE) 50 MCG/ACT NASAL SPRAY    Place 2 sprays into both nostrils daily as needed for allergies or rhinitis (SEASONAL ALLERGIES).   FUROSEMIDE (LASIX) 40 MG TABLET    Take 40 mg by mouth. On S-M-W-F Takes  1 tablet by mouth once d   GLUCOSE BLOOD (ONE TOUCH ULTRA TEST) TEST STRIP    Use to test blood sugar three times daily. Dx: E11.21   INSULIN DETEMIR (LEVEMIR) 100 UNIT/ML INJECTION    Inject 0.18 mLs (18 Units total) into the skin daily. APPOINTMENT OVERDUE   ISOSORBIDE DINITRATE (ISORDIL) 10 MG TABLET    Take by mouth.   MONTELUKAST (SINGULAIR) 10 MG TABLET    TAKE 1  TABLET BY MOUTH AT BEDTIME   MULTIPLE VITAMIN (MULTIVITAMIN WITH MINERALS) TABS TABLET    Take 1 tablet by mouth daily.   NITROGLYCERIN (NITROSTAT) 0.4 MG SL TABLET    Place 1 tablet (0.4 mg total) under the tongue every 5 (five) minutes as needed for chest pain.   PROAIR HFA 108 (90 BASE) MCG/ACT INHALER    INHALE 2 PUFFS BY MOUTH EVERY 6 HOURS AS NEEDED FOR SHORTNESS OF BREATH   VITAMIN E 45 MG (100 UNITS) CAPSULE    Take by mouth.  Modified Medications   No medications on file  Discontinued Medications   No medications on file    Physical Exam:  Vitals:   09/14/20 1314  BP: 138/76  Pulse: (!) 54  Temp: 97.9 F (36.6 C)  TempSrc: Temporal  SpO2: 96%  Weight: 202 lb (91.6 kg)  Height: $Remove'6\' 2"'vIuqXMR$  (1.88 m)   Body mass index is 25.94 kg/m. Wt Readings from Last 3 Encounters:  09/14/20 202 lb (91.6 kg)  07/13/20 201 lb (91.2 kg)  06/23/20 205 lb (93 kg)    Physical Exam Constitutional:      General: He is not in acute distress.    Appearance: He is well-developed. He is not diaphoretic.  HENT:     Head: Normocephalic and atraumatic.     Right Ear: External ear normal.     Left Ear: External ear normal.     Mouth/Throat:     Pharynx: No oropharyngeal exudate.  Eyes:     Conjunctiva/sclera: Conjunctivae normal.     Pupils: Pupils are equal, round, and reactive to light.  Cardiovascular:     Rate and Rhythm: Normal rate and regular rhythm.     Heart sounds: Normal heart sounds.  Pulmonary:     Effort: Pulmonary effort is normal.     Breath sounds: Normal breath sounds.  Abdominal:     General: Bowel sounds are normal.     Palpations: Abdomen is soft.  Musculoskeletal:        General: No tenderness.     Cervical back: Normal range of motion and neck supple.     Right lower leg: No edema.     Left lower leg: No edema.  Skin:    General: Skin is warm and dry.  Neurological:     Mental Status: He is alert and oriented to person, place, and time.  Psychiatric:         Mood and Affect: Mood normal.    Labs reviewed: Basic Metabolic Panel: Recent Labs    10/05/19 0916 10/05/19 1345 02/18/20 0000 03/03/20 0000 03/17/20 0000 03/28/20 0000 03/30/20 0000 04/06/20 1118  NA 135 133*  --  136*  --   --  139 139  K 4.4 3.9 4.9  --  5.2  --   --  3.6  CL 93* 94*  --  92*  --   --  94* 92*  CO2 24 24  --   --   --   --   --  32  GLUCOSE 250* 231*  --   --   --   --   --  128*  BUN 74* 83* 60*  --  72* 59*  --  31*  CREATININE 6.60* 7.00*  --  11.1*  --   --  9.6* 5.43*  CALCIUM 9.2 9.0 9.3 9.6 9.4  --  9.6 9.6  TSH  --   --   --  0.93  --   --   --   --  Liver Function Tests: Recent Labs    10/05/19 1345 11/16/19 0930 03/03/20 0000 03/17/20 0000 04/06/20 1118  AST $Re'22 24 14  'vpf$ --  20  ALT 18 15  --   --  14  ALKPHOS 60 65 56  --  57  BILITOT 0.3 0.2  --   --  0.7  PROT 6.8 7.0  --   --  7.8  ALBUMIN 3.8 4.6  --  4.3 4.3   No results for input(s): LIPASE, AMYLASE in the last 8760 hours. No results for input(s): AMMONIA in the last 8760 hours. CBC: Recent Labs    10/05/19 0916 10/05/19 0916 10/05/19 1345 03/03/20 0000 03/28/20 0000 03/30/20 0000 04/06/20 1118  WBC 5.3  --  5.5 6.4  --  6.6 5.2  NEUTROABS 3.0  --  3.2  --   --   --  3.2  HGB 10.8*   < > 11.0* 11.5* 11.8* 12.2* 12.6*  HCT 32.6*   < > 34.3* 34*  --  36* 37.5*  MCV 98*  --  99.4  --   --   --  107.8*  PLT 212   < > 218 224  --  274 192   < > = values in this interval not displayed.   Lipid Panel: Recent Labs    10/05/19 0916 11/16/19 0930  CHOL 163 127  HDL 50 60  LDLCALC 99 52  TRIG 71 74  CHOLHDL 3.3 2.1   TSH: Recent Labs    03/03/20 0000  TSH 0.93   A1C: Lab Results  Component Value Date   HGBA1C 7.0 03/03/2020     Assessment/Plan 1. Hyperlipidemia with target LDL less than 100 --continues on statin and zetia. [facility] at [facility] labs. Continue dietary modifications with medication management.  - Lipid panel - Hemoglobin A1c  2. DM type 2,  uncontrolled, with renal complications ([place]) Does not check blood sugars routinely but reports monitored in dialysis. No hypoglycemia. Encouraged dietary compliance, routine foot care/monitoring and to keep up with diabetic eye exams through ophthalmology  -will continue current regimen at this time and follow up labs.  - Lipid panel - Hemoglobin A1c  3. Moderate asthma without complication, unspecified whether persistent -stable. Does reports increase shortness of breath when walking long distances. Will continue current regimen.   4. Bilateral lower extremity edema Controlled at this time.   5. Chronic combined systolic and diastolic CHF (congestive heart failure) ([place]) -euvolemic on dialysis. Continue coreg nad lasix   6. Coronary artery disease due to lipid rich plaque Stable, continues on isordil  7. CKD (chronic kidney disease), stage V ([place]) -continues on HD 2 days a week with fluid restriction. Doing well on this regimen.   8. MGUS (monoclonal gammopathy of unknown significance) Stable. Continues to follow up with hematology.   Next appt: 3 months.  Carlos American. [place], [facility] [phone]

## 2020-09-15 LAB — HEMOGLOBIN A1C
Hgb A1c MFr Bld: 7.9 % of total Hgb — ABNORMAL HIGH (ref <5.7)
Mean Plasma Glucose: 180 mg/dL
eAG (mmol/L): 10 mmol/L

## 2020-09-15 LAB — LIPID PANEL
Cholesterol: 183 mg/dL (ref <200)
HDL: 54 mg/dL (ref >=40)
LDL Cholesterol (Calc): 104 mg/dL (calc) — ABNORMAL HIGH
Non-HDL Cholesterol (Calc): 129 mg/dL (calc) (ref <130)
Total CHOL/HDL Ratio: 3.4 (calc) (ref <5.0)
Triglycerides: 150 mg/dL — ABNORMAL HIGH (ref <150)

## 2020-09-16 DIAGNOSIS — D472 Monoclonal gammopathy: Secondary | ICD-10-CM | POA: Diagnosis not present

## 2020-09-16 DIAGNOSIS — N186 End stage renal disease: Secondary | ICD-10-CM | POA: Diagnosis not present

## 2020-09-16 DIAGNOSIS — Z992 Dependence on renal dialysis: Secondary | ICD-10-CM | POA: Diagnosis not present

## 2020-09-16 DIAGNOSIS — N2581 Secondary hyperparathyroidism of renal origin: Secondary | ICD-10-CM | POA: Diagnosis not present

## 2020-09-21 DIAGNOSIS — D472 Monoclonal gammopathy: Secondary | ICD-10-CM | POA: Diagnosis not present

## 2020-09-21 DIAGNOSIS — N2581 Secondary hyperparathyroidism of renal origin: Secondary | ICD-10-CM | POA: Diagnosis not present

## 2020-09-21 DIAGNOSIS — N186 End stage renal disease: Secondary | ICD-10-CM | POA: Diagnosis not present

## 2020-09-21 DIAGNOSIS — Z992 Dependence on renal dialysis: Secondary | ICD-10-CM | POA: Diagnosis not present

## 2020-09-23 DIAGNOSIS — N2581 Secondary hyperparathyroidism of renal origin: Secondary | ICD-10-CM | POA: Diagnosis not present

## 2020-09-23 DIAGNOSIS — Z992 Dependence on renal dialysis: Secondary | ICD-10-CM | POA: Diagnosis not present

## 2020-09-23 DIAGNOSIS — D472 Monoclonal gammopathy: Secondary | ICD-10-CM | POA: Diagnosis not present

## 2020-09-23 DIAGNOSIS — N186 End stage renal disease: Secondary | ICD-10-CM | POA: Diagnosis not present

## 2020-09-26 ENCOUNTER — Encounter (HOSPITAL_COMMUNITY): Payer: Self-pay | Admitting: Emergency Medicine

## 2020-09-26 ENCOUNTER — Encounter (HOSPITAL_COMMUNITY): Payer: Self-pay | Admitting: Hematology

## 2020-09-26 ENCOUNTER — Emergency Department (HOSPITAL_COMMUNITY): Payer: Medicare Other

## 2020-09-26 ENCOUNTER — Other Ambulatory Visit: Payer: Self-pay

## 2020-09-26 ENCOUNTER — Inpatient Hospital Stay (HOSPITAL_COMMUNITY)
Admission: EM | Admit: 2020-09-26 | Discharge: 2020-10-13 | DRG: 270 | Disposition: E | Payer: Medicare Other | Attending: Internal Medicine | Admitting: Internal Medicine

## 2020-09-26 DIAGNOSIS — Z8249 Family history of ischemic heart disease and other diseases of the circulatory system: Secondary | ICD-10-CM

## 2020-09-26 DIAGNOSIS — Z20822 Contact with and (suspected) exposure to covid-19: Secondary | ICD-10-CM | POA: Diagnosis not present

## 2020-09-26 DIAGNOSIS — E113551 Type 2 diabetes mellitus with stable proliferative diabetic retinopathy, right eye: Secondary | ICD-10-CM | POA: Diagnosis present

## 2020-09-26 DIAGNOSIS — I214 Non-ST elevation (NSTEMI) myocardial infarction: Secondary | ICD-10-CM | POA: Diagnosis present

## 2020-09-26 DIAGNOSIS — R52 Pain, unspecified: Secondary | ICD-10-CM | POA: Diagnosis not present

## 2020-09-26 DIAGNOSIS — E113512 Type 2 diabetes mellitus with proliferative diabetic retinopathy with macular edema, left eye: Secondary | ICD-10-CM | POA: Diagnosis present

## 2020-09-26 DIAGNOSIS — Z992 Dependence on renal dialysis: Secondary | ICD-10-CM

## 2020-09-26 DIAGNOSIS — R1084 Generalized abdominal pain: Secondary | ICD-10-CM | POA: Diagnosis not present

## 2020-09-26 DIAGNOSIS — E440 Moderate protein-calorie malnutrition: Secondary | ICD-10-CM | POA: Diagnosis not present

## 2020-09-26 DIAGNOSIS — J9601 Acute respiratory failure with hypoxia: Secondary | ICD-10-CM | POA: Diagnosis not present

## 2020-09-26 DIAGNOSIS — I959 Hypotension, unspecified: Secondary | ICD-10-CM | POA: Diagnosis not present

## 2020-09-26 DIAGNOSIS — R059 Cough, unspecified: Secondary | ICD-10-CM | POA: Diagnosis not present

## 2020-09-26 DIAGNOSIS — E1122 Type 2 diabetes mellitus with diabetic chronic kidney disease: Secondary | ICD-10-CM | POA: Diagnosis present

## 2020-09-26 DIAGNOSIS — R5383 Other fatigue: Secondary | ICD-10-CM | POA: Diagnosis not present

## 2020-09-26 DIAGNOSIS — E669 Obesity, unspecified: Secondary | ICD-10-CM | POA: Diagnosis present

## 2020-09-26 DIAGNOSIS — I4892 Unspecified atrial flutter: Secondary | ICD-10-CM | POA: Diagnosis present

## 2020-09-26 DIAGNOSIS — I517 Cardiomegaly: Secondary | ICD-10-CM | POA: Diagnosis not present

## 2020-09-26 DIAGNOSIS — R0689 Other abnormalities of breathing: Secondary | ICD-10-CM | POA: Diagnosis not present

## 2020-09-26 DIAGNOSIS — E11649 Type 2 diabetes mellitus with hypoglycemia without coma: Secondary | ICD-10-CM | POA: Diagnosis present

## 2020-09-26 DIAGNOSIS — Z833 Family history of diabetes mellitus: Secondary | ICD-10-CM

## 2020-09-26 DIAGNOSIS — D472 Monoclonal gammopathy: Secondary | ICD-10-CM | POA: Diagnosis present

## 2020-09-26 DIAGNOSIS — R000 Tachycardia, unspecified: Secondary | ICD-10-CM | POA: Diagnosis not present

## 2020-09-26 DIAGNOSIS — I5023 Acute on chronic systolic (congestive) heart failure: Secondary | ICD-10-CM | POA: Diagnosis present

## 2020-09-26 DIAGNOSIS — Z4509 Encounter for adjustment and management of other cardiac device: Secondary | ICD-10-CM

## 2020-09-26 DIAGNOSIS — Z951 Presence of aortocoronary bypass graft: Secondary | ICD-10-CM

## 2020-09-26 DIAGNOSIS — K7200 Acute and subacute hepatic failure without coma: Secondary | ICD-10-CM | POA: Diagnosis not present

## 2020-09-26 DIAGNOSIS — R0989 Other specified symptoms and signs involving the circulatory and respiratory systems: Secondary | ICD-10-CM

## 2020-09-26 DIAGNOSIS — J449 Chronic obstructive pulmonary disease, unspecified: Secondary | ICD-10-CM | POA: Diagnosis present

## 2020-09-26 DIAGNOSIS — I252 Old myocardial infarction: Secondary | ICD-10-CM

## 2020-09-26 DIAGNOSIS — R34 Anuria and oliguria: Secondary | ICD-10-CM | POA: Diagnosis not present

## 2020-09-26 DIAGNOSIS — N179 Acute kidney failure, unspecified: Secondary | ICD-10-CM | POA: Diagnosis present

## 2020-09-26 DIAGNOSIS — I132 Hypertensive heart and chronic kidney disease with heart failure and with stage 5 chronic kidney disease, or end stage renal disease: Secondary | ICD-10-CM | POA: Diagnosis present

## 2020-09-26 DIAGNOSIS — Z66 Do not resuscitate: Secondary | ICD-10-CM | POA: Diagnosis not present

## 2020-09-26 DIAGNOSIS — G253 Myoclonus: Secondary | ICD-10-CM | POA: Diagnosis present

## 2020-09-26 DIAGNOSIS — Z0189 Encounter for other specified special examinations: Secondary | ICD-10-CM

## 2020-09-26 DIAGNOSIS — Z79899 Other long term (current) drug therapy: Secondary | ICD-10-CM

## 2020-09-26 DIAGNOSIS — E872 Acidosis: Secondary | ICD-10-CM | POA: Diagnosis present

## 2020-09-26 DIAGNOSIS — Z7982 Long term (current) use of aspirin: Secondary | ICD-10-CM

## 2020-09-26 DIAGNOSIS — R0902 Hypoxemia: Secondary | ICD-10-CM | POA: Diagnosis not present

## 2020-09-26 DIAGNOSIS — I2510 Atherosclerotic heart disease of native coronary artery without angina pectoris: Secondary | ICD-10-CM | POA: Diagnosis present

## 2020-09-26 DIAGNOSIS — E1151 Type 2 diabetes mellitus with diabetic peripheral angiopathy without gangrene: Secondary | ICD-10-CM | POA: Diagnosis not present

## 2020-09-26 DIAGNOSIS — Z7951 Long term (current) use of inhaled steroids: Secondary | ICD-10-CM

## 2020-09-26 DIAGNOSIS — I400 Infective myocarditis: Principal | ICD-10-CM | POA: Diagnosis present

## 2020-09-26 DIAGNOSIS — I4891 Unspecified atrial fibrillation: Secondary | ICD-10-CM | POA: Diagnosis not present

## 2020-09-26 DIAGNOSIS — D696 Thrombocytopenia, unspecified: Secondary | ICD-10-CM | POA: Diagnosis not present

## 2020-09-26 DIAGNOSIS — I272 Pulmonary hypertension, unspecified: Secondary | ICD-10-CM | POA: Diagnosis not present

## 2020-09-26 DIAGNOSIS — Z87891 Personal history of nicotine dependence: Secondary | ICD-10-CM

## 2020-09-26 DIAGNOSIS — E871 Hypo-osmolality and hyponatremia: Secondary | ICD-10-CM | POA: Diagnosis present

## 2020-09-26 DIAGNOSIS — Z452 Encounter for adjustment and management of vascular access device: Secondary | ICD-10-CM

## 2020-09-26 DIAGNOSIS — N2581 Secondary hyperparathyroidism of renal origin: Secondary | ICD-10-CM | POA: Diagnosis not present

## 2020-09-26 DIAGNOSIS — E873 Alkalosis: Secondary | ICD-10-CM | POA: Diagnosis present

## 2020-09-26 DIAGNOSIS — N186 End stage renal disease: Secondary | ICD-10-CM | POA: Diagnosis not present

## 2020-09-26 DIAGNOSIS — Z6823 Body mass index (BMI) 23.0-23.9, adult: Secondary | ICD-10-CM

## 2020-09-26 DIAGNOSIS — D631 Anemia in chronic kidney disease: Secondary | ICD-10-CM | POA: Diagnosis present

## 2020-09-26 DIAGNOSIS — B3322 Viral myocarditis: Secondary | ICD-10-CM | POA: Diagnosis present

## 2020-09-26 DIAGNOSIS — Z515 Encounter for palliative care: Secondary | ICD-10-CM

## 2020-09-26 DIAGNOSIS — G4733 Obstructive sleep apnea (adult) (pediatric): Secondary | ICD-10-CM | POA: Diagnosis present

## 2020-09-26 DIAGNOSIS — R570 Cardiogenic shock: Secondary | ICD-10-CM | POA: Diagnosis present

## 2020-09-26 DIAGNOSIS — E875 Hyperkalemia: Secondary | ICD-10-CM | POA: Diagnosis present

## 2020-09-26 DIAGNOSIS — D709 Neutropenia, unspecified: Secondary | ICD-10-CM | POA: Diagnosis not present

## 2020-09-26 DIAGNOSIS — K219 Gastro-esophageal reflux disease without esophagitis: Secondary | ICD-10-CM | POA: Diagnosis present

## 2020-09-26 DIAGNOSIS — Z794 Long term (current) use of insulin: Secondary | ICD-10-CM

## 2020-09-26 DIAGNOSIS — D619 Aplastic anemia, unspecified: Secondary | ICD-10-CM | POA: Diagnosis present

## 2020-09-26 DIAGNOSIS — E785 Hyperlipidemia, unspecified: Secondary | ICD-10-CM | POA: Diagnosis present

## 2020-09-26 DIAGNOSIS — M898X9 Other specified disorders of bone, unspecified site: Secondary | ICD-10-CM | POA: Diagnosis present

## 2020-09-26 DIAGNOSIS — J690 Pneumonitis due to inhalation of food and vomit: Secondary | ICD-10-CM | POA: Diagnosis present

## 2020-09-26 LAB — CBC WITH DIFFERENTIAL/PLATELET
Abs Immature Granulocytes: 0.03 10*3/uL (ref 0.00–0.07)
Basophils Absolute: 0 10*3/uL (ref 0.0–0.1)
Basophils Relative: 0 %
Eosinophils Absolute: 0 10*3/uL (ref 0.0–0.5)
Eosinophils Relative: 0 %
HCT: 38.9 % — ABNORMAL LOW (ref 39.0–52.0)
Hemoglobin: 12.7 g/dL — ABNORMAL LOW (ref 13.0–17.0)
Immature Granulocytes: 0 %
Lymphocytes Relative: 7 %
Lymphs Abs: 0.5 10*3/uL — ABNORMAL LOW (ref 0.7–4.0)
MCH: 35.9 pg — ABNORMAL HIGH (ref 26.0–34.0)
MCHC: 32.6 g/dL (ref 30.0–36.0)
MCV: 109.9 fL — ABNORMAL HIGH (ref 80.0–100.0)
Monocytes Absolute: 0.9 10*3/uL (ref 0.1–1.0)
Monocytes Relative: 12 %
Neutro Abs: 6.2 10*3/uL (ref 1.7–7.7)
Neutrophils Relative %: 81 %
Platelets: 239 10*3/uL (ref 150–400)
RBC: 3.54 MIL/uL — ABNORMAL LOW (ref 4.22–5.81)
RDW: 16.1 % — ABNORMAL HIGH (ref 11.5–15.5)
WBC: 7.6 10*3/uL (ref 4.0–10.5)
nRBC: 1.6 % — ABNORMAL HIGH (ref 0.0–0.2)

## 2020-09-26 LAB — I-STAT ARTERIAL BLOOD GAS, ED
Acid-base deficit: 8 mmol/L — ABNORMAL HIGH (ref 0.0–2.0)
Bicarbonate: 15.4 mmol/L — ABNORMAL LOW (ref 20.0–28.0)
Calcium, Ion: 1.01 mmol/L — ABNORMAL LOW (ref 1.15–1.40)
HCT: 39 % (ref 39.0–52.0)
Hemoglobin: 13.3 g/dL (ref 13.0–17.0)
O2 Saturation: 99 %
Patient temperature: 97.5
Potassium: 4.7 mmol/L (ref 3.5–5.1)
Sodium: 133 mmol/L — ABNORMAL LOW (ref 135–145)
TCO2: 16 mmol/L — ABNORMAL LOW (ref 22–32)
pCO2 arterial: 25.4 mmHg — ABNORMAL LOW (ref 32.0–48.0)
pH, Arterial: 7.388 (ref 7.350–7.450)
pO2, Arterial: 161 mmHg — ABNORMAL HIGH (ref 83.0–108.0)

## 2020-09-26 LAB — COMPREHENSIVE METABOLIC PANEL
ALT: <5 U/L (ref 0–44)
AST: 99 U/L — ABNORMAL HIGH (ref 15–41)
Albumin: 3.8 g/dL (ref 3.5–5.0)
Alkaline Phosphatase: 123 U/L (ref 38–126)
Anion gap: 30 — ABNORMAL HIGH (ref 5–15)
BUN: 47 mg/dL — ABNORMAL HIGH (ref 8–23)
CO2: 11 mmol/L — ABNORMAL LOW (ref 22–32)
Calcium: 9 mg/dL (ref 8.9–10.3)
Chloride: 92 mmol/L — ABNORMAL LOW (ref 98–111)
Creatinine, Ser: 6.8 mg/dL — ABNORMAL HIGH (ref 0.61–1.24)
GFR, Estimated: 8 mL/min — ABNORMAL LOW (ref >60)
Glucose, Bld: 180 mg/dL — ABNORMAL HIGH (ref 70–99)
Potassium: 5.1 mmol/L (ref 3.5–5.1)
Sodium: 133 mmol/L — ABNORMAL LOW (ref 135–145)
Total Bilirubin: 2.1 mg/dL — ABNORMAL HIGH (ref 0.3–1.2)
Total Protein: 6.6 g/dL (ref 6.5–8.1)

## 2020-09-26 LAB — CBG MONITORING, ED
Glucose-Capillary: 190 mg/dL — ABNORMAL HIGH (ref 70–99)
Glucose-Capillary: 48 mg/dL — ABNORMAL LOW (ref 70–99)
Glucose-Capillary: 88 mg/dL (ref 70–99)
Glucose-Capillary: 90 mg/dL (ref 70–99)
Glucose-Capillary: 96 mg/dL (ref 70–99)

## 2020-09-26 LAB — LACTIC ACID, PLASMA: Lactic Acid, Venous: >11 mmol/L (ref 0.5–1.9)

## 2020-09-26 LAB — TROPONIN I (HIGH SENSITIVITY)
Troponin I (High Sensitivity): 6552 ng/L (ref <18)
Troponin I (High Sensitivity): 7265 ng/L (ref <18)

## 2020-09-26 LAB — LIPASE, BLOOD: Lipase: 38 U/L (ref 11–51)

## 2020-09-26 LAB — PROTIME-INR
INR: 1.7 — ABNORMAL HIGH (ref 0.8–1.2)
Prothrombin Time: 19.9 seconds — ABNORMAL HIGH (ref 11.4–15.2)

## 2020-09-26 LAB — RESP PANEL BY RT-PCR (FLU A&B, COVID) ARPGX2
Influenza A by PCR: NEGATIVE
Influenza B by PCR: NEGATIVE
SARS Coronavirus 2 by RT PCR: NEGATIVE

## 2020-09-26 MED ORDER — DEXTROSE 50 % IV SOLN
INTRAVENOUS | Status: AC
  Administered 2020-09-26: 1 via INTRAVENOUS
  Filled 2020-09-26: qty 50

## 2020-09-26 MED ORDER — DEXTROSE 10 % IV BOLUS: 250.0000 mL | INTRAVENOUS | Status: DC | PRN

## 2020-09-26 MED ORDER — DEXTROSE 50 % IV SOLN: 1.0000 | Freq: Once | INTRAVENOUS | Status: AC

## 2020-09-26 MED ORDER — SODIUM CHLORIDE 0.9 % IV BOLUS
500.0000 mL | Freq: Once | INTRAVENOUS | Status: AC
  Administered 2020-09-26: 500 mL via INTRAVENOUS

## 2020-09-26 NOTE — ED Provider Notes (Signed)
Emergency Department Provider Note   I have reviewed the triage vital signs and the nursing notes.   HISTORY  Chief Complaint Cough   HPI Henry Carroll is a 70 y.o. male with PMH reviewed below including ESRD on CHF (EF 35-40%) presents to the emergency department with generalized weakness and not feeling well.  He denies any chest pain or shortness of breath.  He states been feeling badly for several days but did go to dialysis this morning with no improvement in symptoms.  He did describe some abdominal pain at one point but states its improving. No new medications.   Level 5 caveat: Patient alert but appears critically ill with cool extremities and diaphoresis.   Past Medical History:  Diagnosis Date   Abnormal stress test 02/10/2015   Acute on chronic systolic and diastolic heart failure, NYHA class 1 ([place]) 12/31/2014   Anemia    Anemia in chronic kidney disease 09/03/2012   Arthritis    left  5th finger   Asthma    Asthma, chronic 06/04/2012   Asthma, chronic, mild intermittent, with acute exacerbation 12/22/2015   Bilateral lower extremity edema 12/22/2015   CAD (coronary artery disease) 02/18/2015   Chronic combined systolic (congestive) and diastolic (congestive) heart failure ([place])    a. 12/31/14: 2D ECHO: EF 40-45%, HK of inf myocardium, G1DD, mod MR   Chronic combined systolic and diastolic CHF (congestive heart failure) ([place]) 65/78/4696   Chronic systolic heart failure ([place]) 02/25/2015   CKD (chronic kidney disease), stage III (HCC)    stage 3 kidney disease   CKD (chronic kidney disease), stage V ([place]) 08/30/2018   COLONIC POLYPS, HX OF 04/13/2009   Qualifier: Diagnosis of  By: Westly Pam.    Coronary artery disease    a. LHC 01/2015 - triple vessel CAD (mod oLM, mLAD, severe mRCA, intermediate branch stenosis, CTO of mCx). Plan CABG 02/2015.   Demand ischemia ([place]) 12/31/2014   DM type 2, uncontrolled, with renal complications ([place]) 2/95/2841    Dyspnea     due to fluid   Elevated troponin    Essential hypertension 05/05/4008   Folliculitis of perineum 10/01/2012   Gastric erosion    GERD (gastroesophageal reflux disease)    History of blood transfusion    Hyperlipidemia    Hyperlipidemia with target LDL less than 100 12/31/2014   Hypertension    Hypertensive heart disease with heart failure (HCC) 02/25/2015   IDA (iron deficiency anemia) 01/23/2016   Melena 01/23/2016   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   Mitral regurgitation    a. Mild-mod by echo 12/2014.   Morbid obesity ([place]) 12/29/2014   Myocardial infarction [facility])    pt. states per [name] he has in the past   NSVT (nonsustained ventricular tachycardia) ([place])    a. 9 beats during 01/2015 adm. BB titrated.   Nuclear sclerotic cataract of left eye 05/26/2019   Nuclear sclerotic cataract of right eye 05/26/2019   Obesity    a. BMI 33   Obesity (BMI 30-39.9) 05/05/2013   OSA (obstructive sleep apnea) 01/16/2018   Mild obstructive sleep apnea overall with an AHI of 7.3/h and no significant central sleep apnea. Severe obstructive sleep apnea during REM sleep with an AHI of 34.3/h.  Now on CPAP at 6 cm H2O.    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) 04/07/2020   Stable treated proliferative diabetic retinopathy of right eye determined by examination associated with type 2 diabetes mellitus ([place]) 05/26/2019   Proliferative diabetic retinopathy of left eye with macular  edema associated with type 2 diabetes mellitus ([place]) 05/26/2019   CKD (chronic kidney disease), stage V ([place]) 08/30/2018   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   Symptomatic anemia 08/29/2018   OSA (obstructive sleep apnea) 01/16/2018   Small bowel lesion    Gastric erosion    Iron deficiency anemia 12/10/2016   IDA (iron deficiency anemia) 01/23/2016   Melena 01/23/2016   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   Bilateral lower extremity edema 12/22/2015   Asthma, chronic, mild intermittent, with acute exacerbation 12/22/2015   PAD (peripheral artery disease) ([place]) 69/67/8938   Chronic systolic heart failure ([place]) 02/25/2015   Hypertensive heart disease with heart failure ([place]) 02/25/2015   CAD (coronary artery disease) 02/18/2015   CAD in native artery 02/10/2015   Abnormal stress test 02/10/2015   Mitral regurgitation 02/10/2015   Chronic combined systolic and diastolic CHF (congestive heart failure) ([place]) 02/10/2015   Acute on chronic systolic and diastolic heart failure, NYHA class 1 ([place]) 12/31/2014   Hyperlipidemia with target LDL less than 100 12/31/2014   Demand ischemia ([place]) 12/31/2014   Elevated troponin    Essential hypertension 10/09/2013   DM type 2, uncontrolled, with renal complications ([place]) 11/28/5100   Obesity (BMI 30-39.9) 58/52/7782   Folliculitis of perineum 10/01/2012   Anemia in chronic kidney disease 09/03/2012   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   Asthma, chronic 06/04/2012   COLONIC POLYPS, HX OF 04/13/2009    Past Surgical History:  Procedure Laterality Date   BACK SURGERY     BASCILIC VEIN TRANSPOSITION Left 11/14/2018   Procedure: FIRST STAGE BASILIC VEIN TRANSPOSITION LEFT ARM;  Surgeon: Serafina Mitchell, MD;  Location: [facility];  Service: Vascular;  Laterality: Left;   BASCILIC VEIN TRANSPOSITION Left 01/28/2019   Procedure: BASILIC VEIN TRANSPOSITION SECOND STAGE;   Surgeon: Serafina Mitchell, MD;  Location: [facility];  Service: Vascular;  Laterality: Left;   BONE MARROW BIOPSY     CARDIAC CATHETERIZATION N/A 02/09/2015   Procedure: Left Heart Cath and Coronary Angiography;  Surgeon: Burnell Blanks, MD;  Location: [facility];  Service: Cardiovascular;  Laterality: N/A;   CATARACT EXTRACTION Bilateral 06/11/2020   COLONOSCOPY  2011   Dr. [name]: Ileocecal valve appeared normal, scattered pancolonic diverticulosis, difficult bowel prep making smaller lesions potentially missed. Recommended three-year follow-up colonoscopy.   COLONOSCOPY  2003   Dr. [name]: Suspicious lesion at the ileocecal valve, multiple biopsies benign, pancolonic diverticulosis.   COLONOSCOPY N/A 02/15/2016   diverticulosis in sigmoid and descending colon, single 5 mm polyp at splenic flexure (Tubular adenoma)   CORONARY ARTERY BYPASS GRAFT N/A 02/18/2015   Procedure: CORONARY ARTERY BYPASS GRAFTING (CABG) x  four, using bilateral internal mammary arteries and right leg greater saphenous vein harvested endoscopically;  Surgeon: [name], MD;  Location: [facility];  Service: Open  Heart Surgery;  Laterality: N/A;   ESOPHAGOGASTRODUODENOSCOPY N/A 02/15/2016   normal   EYE SURGERY Right    July 2019    EYE SURGERY Left 06/2020   GIVENS CAPSULE STUDY N/A 01/08/2017   couple of gastric and small bowel eroions in setting of aspirin 81 mg daily but nothing concerning, continue Hematology follow-up   HERNIA REPAIR  2482   Umbilical   LUMBAR [place]  2006   L4 & L5   REFRACTIVE SURGERY Left    2019    REFRACTIVE SURGERY Right    2019   TEE WITHOUT CARDIOVERSION N/A 02/18/2015   Procedure: TRANSESOPHAGEAL ECHOCARDIOGRAM (TEE);  Surgeon: [name], MD;  Location: [facility];  Service: Open Heart Surgery;  Laterality: N/A;   TONSILLECTOMY  1962    Allergies Patient has no allergy information on record.  Family History  Problem Relation Age of Onset   Diabetes Mother     Heart disease Mother        later in life, age >74   Heart disease Father        heart failure later in life   Hypertension Father    Stroke Sister    Kidney disease Daughter    Sudden death Daughter    Colon cancer Paternal Uncle        Passed from colon CA   Heart attack Neg Hx     Social History Social History   Tobacco Use   Smoking status: Former    Types: Cigars    Quit date: 12/31/2014    Years since quitting: 5.7   Smokeless tobacco: Never  Vaping Use   Vaping Use: Never used  Substance Use Topics   Alcohol use: Not Currently    Alcohol/week: 3.0 standard drinks    Types: 3 Glasses of wine per week   Drug use: No    Review of Systems  Constitutional: No fever/chills. Positive generalized weakness.  Eyes: No visual changes. ENT: No sore throat. Cardiovascular: Denies chest pain. Respiratory: Denies shortness of breath. Gastrointestinal: Positive abdominal pain.  No nausea, no vomiting.  No diarrhea.  No constipation. Genitourinary: Negative for dysuria. Musculoskeletal: Negative for back pain. Skin: Negative for rash. Neurological: Negative for headaches, focal weakness or numbness.  10-point ROS otherwise negative.  ____________________________________________   PHYSICAL EXAM:  VITAL SIGNS: ED Triage Vitals  Enc Vitals Group     BP 10/02/2020 1917 (!) 124/93     Pulse Rate 09/29/2020 1917 (!) 136     Resp 09/17/2020 1917 (!) 27     Temp 09/20/2020 1917 (!) 97.1 F (36.2 C)     Temp Source 10/03/2020 1917 Oral     SpO2 09/18/2020 1917 100 %     Weight 10/05/2020 1914 200 lb 9.9 oz (91 kg)     Height 09/20/2020 1914 $RemoveBefor'6\' 2"'HaGnqyPZsrBr$  (1.88 m)    Constitutional: Patient appears unwell.  He is cool in his extremities and diaphoretic.  Eyes: Conjunctivae are normal.  Head: Atraumatic. Nose: No congestion/rhinnorhea. Mouth/Throat: Mucous membranes are moist.  Neck: No stridor.   Cardiovascular: [place]. Good peripheral circulation. Grossly normal heart sounds.    Respiratory: Normal respiratory effort.  No retractions. Lungs CTAB. Gastrointestinal: Soft and nontender. No distention.  Musculoskeletal: No lower extremity tenderness. No gross deformities of extremities. Neurologic:  Normal speech and language. No focal deficits but generally weak.  Skin:  Skin is warm, dry and intact. No rash noted.   ____________________________________________   LABS (all labs ordered  are listed, but only abnormal results are displayed)  Labs Reviewed  COMPREHENSIVE METABOLIC PANEL - Abnormal; Notable for the following components:      Result Value   Sodium 133 (*)    Chloride 92 (*)    CO2 11 (*)    Glucose, Bld 180 (*)    BUN 47 (*)    Creatinine, Ser 6.80 (*)    AST 99 (*)    Total Bilirubin 2.1 (*)    GFR, Estimated 8 (*)    Anion gap 30 (*)    All other components within normal limits  CBC WITH DIFFERENTIAL/PLATELET - Abnormal; Notable for the following components:   RBC 3.54 (*)    Hemoglobin 12.7 (*)    HCT 38.9 (*)    MCV 109.9 (*)    MCH 35.9 (*)    RDW 16.1 (*)    nRBC 1.6 (*)    Lymphs Abs 0.5 (*)    All other components within normal limits  LACTIC ACID, PLASMA - Abnormal; Notable for the following components:   Lactic Acid, Venous >11.0 (*)    All other components within normal limits  PROTIME-INR - Abnormal; Notable for the following components:   Prothrombin Time 19.9 (*)    INR 1.7 (*)    All other components within normal limits  CBG MONITORING, ED - Abnormal; Notable for the following components:   Glucose-Capillary 48 (*)    All other components within normal limits  CBG MONITORING, ED - Abnormal; Notable for the following components:   Glucose-Capillary 190 (*)    All other components within normal limits  I-STAT ARTERIAL BLOOD GAS, ED - Abnormal; Notable for the following components:   pCO2 arterial 25.4 (*)    pO2, Arterial 161 (*)    Bicarbonate 15.4 (*)    TCO2 16 (*)    Acid-base deficit 8.0 (*)    Sodium 133  (*)    Calcium, Ion 1.01 (*)    All other components within normal limits  TROPONIN I (HIGH SENSITIVITY) - Abnormal; Notable for the following components:   Troponin I (High Sensitivity) 6,552 (*)    All other components within normal limits  RESP PANEL BY RT-PCR (FLU A&B, COVID) ARPGX2  CULTURE, BLOOD (ROUTINE X 2)  CULTURE, BLOOD (ROUTINE X 2)  LIPASE, BLOOD  URINALYSIS, ROUTINE W REFLEX MICROSCOPIC  CBG MONITORING, ED  CBG MONITORING, ED  CBG MONITORING, ED  TROPONIN I (HIGH SENSITIVITY)   ____________________________________________  EKG   EKG Interpretation  Date/Time:  Monday September 26 2020 20:59:44 EDT Ventricular Rate:  129 PR Interval:  60 QRS Duration: 131 QT Interval:  348 QTC Calculation: 510 R Axis:   -50 Text Interpretation: Sinus or ectopic atrial tachycardia Consider right atrial enlargement Nonspecific IVCD with LAD LVH with secondary repolarization abnormality Abnormal inferior Q waves Anterior Q waves, possibly due to LVH Confirmed by Thamas Jaegers ([id number]) on 10/03/2020 10:14:22 PM        ____________________________________________  RADIOLOGY  DG Chest Portable 1 View  Result Date: 10/11/2020 CLINICAL DATA:  Cough EXAM: PORTABLE CHEST 1 VIEW COMPARISON:  06/03/2020 FINDINGS: Post sternotomy changes. Cardiomegaly. Aortic atherosclerosis. Possible tiny left effusion. No pneumothorax IMPRESSION: Cardiomegaly with possible trace left effusion Electronically Signed   By: Donavan Foil M.D.   On: 10/05/2020 20:52    ____________________________________________   PROCEDURES  Procedure(s) performed:   Procedures  CRITICAL CARE Performed by: Margette Fast Total critical care time: 75 minutes Critical care time was exclusive  of separately billable procedures and treating other patients. Critical care was necessary to treat or prevent imminent or life-threatening deterioration. Critical care was time spent personally by me on the following activities:  development of treatment plan with patient and/or surrogate as well as nursing, discussions with consultants, evaluation of patient's response to treatment, examination of patient, obtaining history from patient or surrogate, ordering and performing treatments and interventions, ordering and review of laboratory studies, ordering and review of radiographic studies, pulse oximetry and re-evaluation of patient's condition.  Alona Bene, MD Emergency Medicine  ____________________________________________   INITIAL IMPRESSION / ASSESSMENT AND PLAN / ED COURSE  Pertinent labs & imaging results that were available during my care of the patient were reviewed by me and considered in my medical decision making (see chart for details).  Patient presents to the emergency department looking very ill.  He has ice cold extremities and tachycardia with profuse diaphoresis.  Despite this, he is alert although slightly drowsy and giving a reasonable history.  He is able to tell me is not having chest pain.  His EKG shows tachycardia with ST changes laterally which are likely rate related.  This does not seem consistent with septic shock.  After brief chart review was able to see the patient does have a prior echo showing EF of around 40%.  Quickly placed a ultrasound probe on the chest and see very weak heart contractility. No pericardial effusion. Will contact cardiology ASAP.    08:10 PM  Poke with Dr. Mayford Knife with cardiology.  Discussed my quick bedside echo and the patient's clinical presentation with concern for acute decompensated cardiogenic shock. Discussed EKG findings. Patient is not currently hypotensive but borderline.  He is tachycardic.  He has cool extremities.  Gave D50 with no significant improvement.  Dr. Mayford Knife would like him transferred to [facility] immediately, ED to ED if necessary, and will want the cards fellow to see him. Will ultimately need cards ICU but cannot wait. He may need [place]  catheter and milrinone but advises that if he becomes hypotensive we can start Levophed.  He is not hypotensive now so will hold. Discussed with Dr. Audley Hose at [facility] who will accept the patient in transfer. Cards to see ASAP. [facility] to send a truck ASAP.   08:39 PM  Reevaluated patient.  His mental status is unchanged.  He has not become hypotensive.  [facility] in route for transfer.  Has a working peripheral IV for now and not requiring inotropes. Appears stable for transfer. Diaphoresis resolved. Extremities remain cool to touch.   09:15 PM  Patient status unchanged. Lactate > 11. Exam not consistent with septic shock and likely reflective of critically low EF. No hypotension. Updated [facility] at bedside. Patient is now complaining of CP. Repeated EKG with continued ST change laterally.    I reviewed all nursing notes, vitals, pertinent old records, EKGs, labs, imaging (as available).  ____________________________________________  FINAL CLINICAL IMPRESSION(S) / ED DIAGNOSES  Final diagnoses:  Cardiogenic shock (HCC)    MEDICATIONS GIVEN DURING THIS VISIT:  Medications  dextrose (D10W) 10% bolus 250 mL (has no administration in time range)  sodium chloride 0.9 % bolus 500 mL (0 mLs Intravenous Stopped 10/03/2020 2036)  dextrose 50 % solution 50 mL (1 ampule Intravenous Given 09/29/2020 2041)    Note:  This document was prepared using Dragon voice recognition software and may include unintentional dictation errors.  Alona Bene, MD, [facility] Emergency Medicine    Chirag Krueger, Arlyss Repress, MD 10/01/2020 2308

## 2020-09-26 NOTE — ED Triage Notes (Signed)
Pt c/o cough, chills, and abd pain since Saturday. Pt states he took at home covid test that was negative.

## 2020-09-26 NOTE — ED Notes (Signed)
Pt went into [facility] for cough and weakness.  Pt did have a normal dialysis session today, has had chest pain for "several days."  Once [facility] arrived he reports 9/10 chest pain, was given 324ASA and 2sl nitro which took his bp from 148 to 102.  Pain is now a 4.5/10.   Initial CBG was 40 upon arrival, given D50 increased to 190, CBG w. [facility] was 88, 90 upon arrival to [facility].

## 2020-09-26 NOTE — ED Provider Notes (Signed)
[facility] EMERGENCY DEPARTMENT Provider Note   CSN: [id number] Arrival date & time: 10/04/2020  1853     History Chief Complaint  Patient presents with   Cough    Henry Carroll is a 70 y.o. male.   Cough Associated symptoms: chest pain and shortness of breath   Associated symptoms: no chills, no ear pain, no fever, no rash and no sore throat    70 year old male with ESRD on HD (most recently dialyzed today), CHF with reduced ejection fraction presenting to the emergency department as a transfer from [facility] emergency department due to concern for cardiogenic shock.  Patient reports that he has been fatigued for the past several days.  He has had some chest pain for at least the past 2 days.  At the outside hospital, the patient was found to have cool extremities concerning for decreased perfusion.  He was tachycardic with a narrow pulse pressure.  Bedside echocardiogram performed at the outside provider showed a markedly decreased ejection fraction of likely less than 10%.  He had an elevated lactic acid concerning for cardiogenic shock.  Cardiology was contacted and they recommend ED to ED transfer, prompting his arrival here.  Patient was also hypoglycemic at outside hospital and received dextrose.  Patient reports that while at any pain, he had no chest pain but then developed some chest pressure with EMS.  He was given 2 sublingual nitroglycerin in route.  No further episodes of hypoglycemia in route with EMS.  Patient reports continued chest pressure currently in the ED.  Past Medical History:  Diagnosis Date   Abnormal stress test 02/10/2015   Acute on chronic systolic and diastolic heart failure, NYHA class 1 ([place]) 12/31/2014   Anemia    Anemia in chronic kidney disease 09/03/2012   Arthritis    left  5th finger   Asthma    Asthma, chronic 06/04/2012   Asthma, chronic, mild intermittent, with acute exacerbation 12/22/2015   Bilateral lower extremity  edema 12/22/2015   CAD (coronary artery disease) 02/18/2015   Chronic combined systolic (congestive) and diastolic (congestive) heart failure ([place])    a. 12/31/14: 2D ECHO: EF 40-45%, HK of inf myocardium, G1DD, mod MR   Chronic combined systolic and diastolic CHF (congestive heart failure) ([place]) 02/63/7858   Chronic systolic heart failure ([place]) 02/25/2015   CKD (chronic kidney disease), stage III (HCC)    stage 3 kidney disease   CKD (chronic kidney disease), stage V ([place]) 08/30/2018   COLONIC POLYPS, HX OF 04/13/2009   Qualifier: Diagnosis of  By: Westly Pam.    Coronary artery disease    a. LHC 01/2015 - triple vessel CAD (mod oLM, mLAD, severe mRCA, intermediate branch stenosis, CTO of mCx). Plan CABG 02/2015.   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 Mild-mod by echo 12/2014.   Morbid  obesity ([place]) 12/29/2014   Myocardial infarction [facility])    pt. states per [name] he has in the past   NSVT (nonsustained ventricular tachycardia) ([place])    a. 9 beats during 01/2015 adm. BB titrated.   Nuclear sclerotic cataract of left eye 05/26/2019   Nuclear sclerotic cataract of right eye 05/26/2019   Obesity    a. BMI 33   Obesity (BMI 30-39.9) 05/05/2013   OSA (obstructive sleep apnea) 01/16/2018   Mild obstructive sleep apnea overall with an AHI of 7.3/h and no significant central sleep apnea. Severe  obstructive sleep apnea during REM sleep with an AHI of 34.3/h.  Now on CPAP at 6 cm H2O.    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   Type II diabetes mellitus ([place])    Vitreous hemorrhage of right eye ([place]) 05/26/2019   Wears dentures     Patient Active Problem List   Diagnosis Date Noted   Pseudophakia of both eyes 07/04/2020   Vitreous hemorrhage of left eye ([place]) 04/07/2020   Stable treated proliferative diabetic retinopathy of right eye determined by examination associated with type 2 diabetes mellitus ([place]) 05/26/2019   Proliferative diabetic retinopathy of left eye with macular edema associated with type 2 diabetes mellitus ([place]) 05/26/2019   CKD (chronic kidney disease), stage V ([place]) 08/30/2018   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   Symptomatic anemia 08/29/2018   OSA (obstructive sleep apnea) 01/16/2018   Small bowel lesion    Gastric erosion    Iron deficiency anemia 12/10/2016   IDA (iron deficiency anemia) 01/23/2016   Melena 01/23/2016   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   Bilateral lower extremity edema 12/22/2015   Asthma, chronic, mild intermittent, with acute exacerbation 12/22/2015   PAD (peripheral artery disease) ([place]) 29/47/6546   Chronic systolic heart failure ([place]) 02/25/2015   Hypertensive heart disease with heart failure ([place]) 02/25/2015   CAD (coronary artery disease) 02/18/2015   CAD in native artery 02/10/2015   Abnormal stress test 02/10/2015   Mitral regurgitation 02/10/2015   Chronic combined systolic and diastolic CHF (congestive heart failure) ([place]) 02/10/2015   Acute on chronic systolic and diastolic heart failure, NYHA class 1 ([place]) 12/31/2014   Hyperlipidemia with  target LDL less than 100 12/31/2014   Demand ischemia ([place]) 12/31/2014   Elevated troponin    Essential hypertension 10/09/2013   DM type 2, uncontrolled, with renal complications ([place]) 50/35/4656   Obesity (BMI 30-39.9) 81/27/5170   Folliculitis of perineum 10/01/2012   Anemia in chronic kidney disease 09/03/2012   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   Asthma, chronic 06/04/2012   COLONIC POLYPS, HX OF 04/13/2009    Past Surgical History:  Procedure Laterality Date   BACK SURGERY     BASCILIC VEIN TRANSPOSITION Left 11/14/2018   Procedure: FIRST STAGE BASILIC VEIN TRANSPOSITION LEFT ARM;  Surgeon: Serafina Mitchell, MD;  Location: [place];  Service: Vascular;  Laterality: Left;   BASCILIC VEIN TRANSPOSITION Left 01/28/2019   Procedure: BASILIC VEIN TRANSPOSITION SECOND STAGE;  Surgeon: Serafina Mitchell, MD;  Location: [place];  Service: Vascular;  Laterality: Left;   BONE MARROW BIOPSY     CARDIAC CATHETERIZATION N/A 02/09/2015   Procedure:  Left Heart Cath and Coronary Angiography;  Surgeon: Burnell Blanks, MD;  Location: [place] [facility];  Service: Cardiovascular;  Laterality: N/A;   CATARACT EXTRACTION Bilateral 06/11/2020   COLONOSCOPY  2011   Dr. [name]: Ileocecal valve appeared normal, scattered pancolonic diverticulosis, difficult bowel prep making smaller lesions potentially missed. Recommended three-year follow-up colonoscopy.   COLONOSCOPY  2003   Dr. [name]: Suspicious lesion at the ileocecal valve, multiple biopsies benign, pancolonic diverticulosis.   COLONOSCOPY N/A 02/15/2016   diverticulosis in sigmoid and descending colon, single 5 mm polyp at splenic flexure (Tubular adenoma)   CORONARY ARTERY BYPASS GRAFT N/A 02/18/2015   Procedure: CORONARY ARTERY BYPASS GRAFTING (CABG) x  four, using bilateral internal mammary arteries and right leg greater saphenous vein harvested endoscopically;  Surgeon: [name], MD;   Location: [place];  Service: Open Heart Surgery;  Laterality: N/A;   ESOPHAGOGASTRODUODENOSCOPY N/A 02/15/2016   normal   EYE SURGERY Right    July 2019    EYE SURGERY Left 06/2020   GIVENS CAPSULE STUDY N/A 01/08/2017   couple of gastric and small bowel eroions in setting of aspirin 81 mg daily but nothing concerning, continue Hematology follow-up   HERNIA REPAIR  [id number]   Umbilical   LUMBAR [place]  2006   L4 & L5   REFRACTIVE SURGERY Left    2019    REFRACTIVE SURGERY Right    2019   TEE WITHOUT CARDIOVERSION N/A 02/18/2015   Procedure: TRANSESOPHAGEAL ECHOCARDIOGRAM (TEE);  Surgeon: [name], MD;  Location: [place];  Service: Open Heart Surgery;  Laterality: N/A;   TONSILLECTOMY  1962       Family History  Problem Relation Age of Onset   Diabetes Mother    Heart disease Mother        later in life, age >39   Heart disease Father        heart failure later in life   Hypertension Father    Stroke Sister    Kidney disease Daughter    Sudden death Daughter    Colon cancer Paternal Uncle        Passed from colon CA   Heart attack Neg Hx     Social History   Tobacco Use   Smoking status: Former    Types: Cigars    Quit date: 12/31/2014    Years since quitting: 5.7   Smokeless tobacco: Never  Vaping Use   Vaping Use: Never used  Substance Use Topics   Alcohol use: Not Currently    Alcohol/week: 3.0 standard drinks    Types: 3 Glasses of wine per week   Drug use: No    Home Medications Prior to Admission medications   Medication Sig Start Date End Date Taking? Authorizing Provider  acetaminophen (TYLENOL) 500 MG tablet Take 1,000 mg by mouth as needed.    [provider]  albuterol (PROVENTIL) (2.5 MG/3ML) 0.083% nebulizer solution Take 3 mLs (2.5 mg total) by nebulization every 6 (six) hours as needed for wheezing or shortness of breath. 09/14/20   [name], NP  amLODipine (NORVASC) 10 MG tablet Take 1 tablet by mouth once daily 01/25/20    [name], NP  aspirin EC 81 MG tablet Take 1 tablet (81 mg total) by mouth daily. 01/24/16   Burtis Junes, NP  atorvastatin (LIPITOR) 40 MG tablet TAKE 1 TABLET BY MOUTH ONCE DAILY IN THE EVENING 02/03/20   [name], NP  carvedilol (  COREG) 12.5 MG tablet Take 12.5 mg by mouth daily.  08/26/19   [provider]  Cholecalciferol (VITAMIN D3) 25 MCG (1000 UT) CAPS Take 2 capsules by mouth daily. 08/26/19   [provider]  Cinnamon 500 MG capsule Take 1,000 mg by mouth daily.     [provider]  Coenzyme Q10 10 MG capsule Take 10 mg by mouth daily. 08/26/19   [provider]  epoetin alfa-epbx (RETACRIT) 80998 UNIT/ML injection 20,000 Units every 14 (fourteen) days.     Edrick Oh, MD  ezetimibe (ZETIA) 10 MG tablet Take 1 tablet (10 mg total) by mouth daily. 10/07/19 02/12/58  Burtis Junes, NP  fluticasone (FLONASE) 50 MCG/ACT nasal spray Place 2 sprays into both nostrils daily as needed for allergies or rhinitis (SEASONAL ALLERGIES).    [provider]  fluticasone-salmeterol (ADVAIR DISKUS) 100-50 MCG/ACT AEPB Inhale 1 puff into the lungs 2 (two) times daily. 09/14/20   [name], NP  furosemide (LASIX) 40 MG tablet Take 40 mg by mouth. On S-M-W-F Takes  1 tablet by mouth once d    [provider]  glucose blood (ONE TOUCH ULTRA TEST) test strip Use to test blood sugar three times daily. Dx: E11.21 12/04/17   Gildardo Cranker, DO  insulin detemir (LEVEMIR) 100 UNIT/ML injection Inject 0.18 mLs (18 Units total) into the skin daily. APPOINTMENT OVERDUE 09/12/20   [name], NP  isosorbide dinitrate (ISORDIL) 10 MG tablet Take by mouth.    [provider]  montelukast (SINGULAIR) 10 MG tablet TAKE 1 TABLET BY MOUTH AT BEDTIME 02/03/20   [name], NP  Multiple Vitamin (MULTIVITAMIN WITH MINERALS) TABS tablet Take 1 tablet by mouth daily.    [provider]  nitroGLYCERIN (NITROSTAT)  0.4 MG SL tablet Place 1 tablet (0.4 mg total) under the tongue every 5 (five) minutes as needed for chest pain. 09/13/20 10/03/21  Jerline Pain, MD  PROAIR HFA 108 [phone] Base) MCG/ACT inhaler INHALE 2 PUFFS BY MOUTH EVERY 6 HOURS AS NEEDED FOR SHORTNESS OF BREATH 09/14/20   [name], NP  vitamin E 45 MG (100 UNITS) capsule Take by mouth.    [provider]    Allergies    Patient has no allergy information on record.  Review of Systems   Review of Systems  Constitutional:  Negative for chills and fever.  HENT:  Negative for ear pain and sore throat.   Eyes:  Negative for pain and visual disturbance.  Respiratory:  Positive for shortness of breath. Negative for cough.   Cardiovascular:  Positive for chest pain. Negative for palpitations.  Gastrointestinal:  Negative for abdominal pain and vomiting.  Genitourinary:  Negative for dysuria and hematuria.  Musculoskeletal:  Negative for arthralgias and back pain.  Skin:  Negative for color change and rash.  Neurological:  Negative for seizures and syncope.  All other systems reviewed and are negative.  Physical Exam Updated Vital Signs BP 116/84   Pulse (!) 139   Temp (!) 97.1 F (36.2 C) (Oral)   Resp 14   Ht $R'6\' 2"'Sf$  (1.88 m)   Wt 91 kg   SpO2 98%   BMI 25.76 kg/m   Physical Exam Vitals and nursing note reviewed.  Constitutional:      General: He is not in acute distress.    Appearance: He is well-developed. He is ill-appearing and toxic-appearing.     Comments: Somnolent, wakes with verbal stimulus  HENT:  Head: Normocephalic and atraumatic.  Eyes:     Conjunctiva/sclera: Conjunctivae normal.  Cardiovascular:     Rate and Rhythm: Regular rhythm. Tachycardia present.  Pulmonary:     Effort: Pulmonary effort is normal. No respiratory distress.     Breath sounds: Normal breath sounds. No wheezing or rales.  Abdominal:     Palpations: Abdomen is soft.     Tenderness: There is no abdominal tenderness. There  is no guarding or rebound.  Musculoskeletal:     Cervical back: Normal range of motion and neck supple. No rigidity or tenderness.  Skin:    General: Skin is dry.     Capillary Refill: Capillary refill takes more than 3 seconds.     Comments: Extremities are cool to the touch distally  Neurological:     Mental Status: He is oriented to person, place, and time.    ED Results / Procedures / Treatments   Labs (all labs ordered are listed, but only abnormal results are displayed) Labs Reviewed  COMPREHENSIVE METABOLIC PANEL - Abnormal; Notable for the following components:      Result Value   Sodium 133 (*)    Chloride 92 (*)    CO2 11 (*)    Glucose, Bld 180 (*)    BUN 47 (*)    Creatinine, Ser 6.80 (*)    AST 99 (*)    Total Bilirubin 2.1 (*)    GFR, Estimated 8 (*)    Anion gap 30 (*)    All other components within normal limits  CBC WITH DIFFERENTIAL/PLATELET - Abnormal; Notable for the following components:   RBC 3.54 (*)    Hemoglobin 12.7 (*)    HCT 38.9 (*)    MCV 109.9 (*)    MCH 35.9 (*)    RDW 16.1 (*)    nRBC 1.6 (*)    Lymphs Abs 0.5 (*)    All other components within normal limits  LACTIC ACID, PLASMA - Abnormal; Notable for the following components:   Lactic Acid, Venous >11.0 (*)    All other components within normal limits  PROTIME-INR - Abnormal; Notable for the following components:   Prothrombin Time 19.9 (*)    INR 1.7 (*)    All other components within normal limits  CBG MONITORING, ED - Abnormal; Notable for the following components:   Glucose-Capillary 48 (*)    All other components within normal limits  CBG MONITORING, ED - Abnormal; Notable for the following components:   Glucose-Capillary 190 (*)    All other components within normal limits  I-STAT ARTERIAL BLOOD GAS, ED - Abnormal; Notable for the following components:   pCO2 arterial 25.4 (*)    pO2, Arterial 161 (*)    Bicarbonate 15.4 (*)    TCO2 16 (*)    Acid-base deficit 8.0 (*)     Sodium 133 (*)    Calcium, Ion 1.01 (*)    All other components within normal limits  TROPONIN I (HIGH SENSITIVITY) - Abnormal; Notable for the following components:   Troponin I (High Sensitivity) 6,552 (*)    All other components within normal limits  RESP PANEL BY RT-PCR (FLU A&B, COVID) ARPGX2  CULTURE, BLOOD (ROUTINE X 2)  CULTURE, BLOOD (ROUTINE X 2)  LIPASE, BLOOD  URINALYSIS, ROUTINE W REFLEX MICROSCOPIC  CBG MONITORING, ED  CBG MONITORING, ED  CBG MONITORING, ED  TROPONIN I (HIGH SENSITIVITY)  TROPONIN I (HIGH SENSITIVITY)    EKG EKG Interpretation  Date/Time:  Monday September 26 2020 20:59:44 EDT Ventricular Rate:  129 PR Interval:  60 QRS Duration: 131 QT Interval:  348 QTC Calculation: 510 R Axis:   -50 Text Interpretation: Sinus or ectopic atrial tachycardia Consider right atrial enlargement Nonspecific IVCD with LAD LVH with secondary repolarization abnormality Abnormal inferior Q waves Anterior Q waves, possibly due to LVH Confirmed by Thamas Jaegers ([id number]) on 10/08/2020 10:14:22 PM  Radiology DG Chest Portable 1 View  Result Date: 09/21/2020 CLINICAL DATA:  Cough EXAM: PORTABLE CHEST 1 VIEW COMPARISON:  06/03/2020 FINDINGS: Post sternotomy changes. Cardiomegaly. Aortic atherosclerosis. Possible tiny left effusion. No pneumothorax IMPRESSION: Cardiomegaly with possible trace left effusion Electronically Signed   By: Donavan Foil M.D.   On: 10/11/2020 20:52    Procedures Procedures   Medications Ordered in ED Medications  dextrose (D10W) 10% bolus 250 mL (has no administration in time range)  sodium chloride 0.9 % bolus 500 mL (0 mLs Intravenous Stopped 10/10/2020 2036)  dextrose 50 % solution 50 mL (1 ampule Intravenous Given 09/28/2020 2041)    ED Course  I have reviewed the triage vital signs and the nursing notes.  Pertinent labs & imaging results that were available during my care of the patient were reviewed by me and considered in my medical decision  making (see chart for details).    MDM Rules/Calculators/A&P                           70 year old male with above past medical history presenting with chest pain.  Vital signs reviewed on arrival, patient's tachycardia has worsened to around 140 bpm, it appears regular and sinus on the monitor.  Blood pressure still with a narrow pulse pressure of around 110/90.  The patient is somnolent, but he wakes easily and is protecting his airway.  His extremities remain cold.  The patient's mental status, cold extremities, elevated lactic acidosis resulting in an anion gap metabolic acidosis all concerning for poor perfusion.  Patient denies infectious symptoms, given the results of his point-of-care bedside echo seems cardiogenic in etiology.  I reviewed the labs from the other hospital.  I also reviewed the imaging and EKG.  We will trend the troponin, regularly check glucose, and consult cardiology for further evaluation management.  We will check an ABG.  18-gauge ultrasound-guided IV placed on arrival here.   Cardiology states that they will take the patient directly from the ED to the Cath Lab as he is having ongoing chest pressure concerning for an NSTEMI and he will likely need a balloon pump as well.   Final Clinical Impression(s) / ED Diagnoses Final diagnoses:  Cardiogenic shock [facility])    Rx / DC Orders ED Discharge Orders     None        Claud Kelp, MD 09/16/2020 [id number]    Luna Fuse, MD 09/29/20 [phone]

## 2020-09-26 NOTE — ED Notes (Addendum)
Cardiology MD at bedside with patient.

## 2020-09-26 NOTE — ED Notes (Signed)
Date and time results received: 09/22/2020 2107 (use smartphrase ".now" to insert current time)  Test: greater than 11 Critical Value: Latic Acid  Name of Provider Notified: Coralyn Helling, MD

## 2020-09-27 ENCOUNTER — Inpatient Hospital Stay (HOSPITAL_COMMUNITY): Payer: Medicare Other

## 2020-09-27 ENCOUNTER — Inpatient Hospital Stay (HOSPITAL_COMMUNITY): Payer: Medicare Other | Admitting: Anesthesiology

## 2020-09-27 ENCOUNTER — Encounter (HOSPITAL_COMMUNITY): Payer: Self-pay | Admitting: Anesthesiology

## 2020-09-27 ENCOUNTER — Inpatient Hospital Stay (HOSPITAL_COMMUNITY): Admission: EM | Disposition: E | Payer: Self-pay | Source: Home / Self Care | Attending: Pulmonary Disease

## 2020-09-27 ENCOUNTER — Encounter (HOSPITAL_COMMUNITY): Payer: Self-pay | Admitting: Interventional Cardiology

## 2020-09-27 DIAGNOSIS — Z992 Dependence on renal dialysis: Secondary | ICD-10-CM | POA: Diagnosis not present

## 2020-09-27 DIAGNOSIS — I2510 Atherosclerotic heart disease of native coronary artery without angina pectoris: Secondary | ICD-10-CM | POA: Diagnosis not present

## 2020-09-27 DIAGNOSIS — R569 Unspecified convulsions: Secondary | ICD-10-CM | POA: Diagnosis not present

## 2020-09-27 DIAGNOSIS — E440 Moderate protein-calorie malnutrition: Secondary | ICD-10-CM | POA: Diagnosis present

## 2020-09-27 DIAGNOSIS — J9601 Acute respiratory failure with hypoxia: Secondary | ICD-10-CM | POA: Diagnosis not present

## 2020-09-27 DIAGNOSIS — I672 Cerebral atherosclerosis: Secondary | ICD-10-CM | POA: Diagnosis not present

## 2020-09-27 DIAGNOSIS — E1122 Type 2 diabetes mellitus with diabetic chronic kidney disease: Secondary | ICD-10-CM | POA: Diagnosis present

## 2020-09-27 DIAGNOSIS — D619 Aplastic anemia, unspecified: Secondary | ICD-10-CM | POA: Diagnosis present

## 2020-09-27 DIAGNOSIS — R0989 Other specified symptoms and signs involving the circulatory and respiratory systems: Secondary | ICD-10-CM | POA: Diagnosis not present

## 2020-09-27 DIAGNOSIS — E875 Hyperkalemia: Secondary | ICD-10-CM | POA: Diagnosis not present

## 2020-09-27 DIAGNOSIS — E113551 Type 2 diabetes mellitus with stable proliferative diabetic retinopathy, right eye: Secondary | ICD-10-CM | POA: Diagnosis present

## 2020-09-27 DIAGNOSIS — I132 Hypertensive heart and chronic kidney disease with heart failure and with stage 5 chronic kidney disease, or end stage renal disease: Secondary | ICD-10-CM | POA: Diagnosis present

## 2020-09-27 DIAGNOSIS — I509 Heart failure, unspecified: Secondary | ICD-10-CM | POA: Diagnosis not present

## 2020-09-27 DIAGNOSIS — E1151 Type 2 diabetes mellitus with diabetic peripheral angiopathy without gangrene: Secondary | ICD-10-CM | POA: Diagnosis present

## 2020-09-27 DIAGNOSIS — Z452 Encounter for adjustment and management of vascular access device: Secondary | ICD-10-CM | POA: Diagnosis not present

## 2020-09-27 DIAGNOSIS — D631 Anemia in chronic kidney disease: Secondary | ICD-10-CM | POA: Diagnosis present

## 2020-09-27 DIAGNOSIS — J9811 Atelectasis: Secondary | ICD-10-CM | POA: Diagnosis not present

## 2020-09-27 DIAGNOSIS — R570 Cardiogenic shock: Secondary | ICD-10-CM

## 2020-09-27 DIAGNOSIS — R9431 Abnormal electrocardiogram [ECG] [EKG]: Secondary | ICD-10-CM | POA: Diagnosis not present

## 2020-09-27 DIAGNOSIS — I214 Non-ST elevation (NSTEMI) myocardial infarction: Secondary | ICD-10-CM | POA: Diagnosis present

## 2020-09-27 DIAGNOSIS — G931 Anoxic brain damage, not elsewhere classified: Secondary | ICD-10-CM | POA: Diagnosis not present

## 2020-09-27 DIAGNOSIS — Z20822 Contact with and (suspected) exposure to covid-19: Secondary | ICD-10-CM | POA: Diagnosis not present

## 2020-09-27 DIAGNOSIS — R34 Anuria and oliguria: Secondary | ICD-10-CM | POA: Diagnosis not present

## 2020-09-27 DIAGNOSIS — N186 End stage renal disease: Secondary | ICD-10-CM

## 2020-09-27 DIAGNOSIS — R918 Other nonspecific abnormal finding of lung field: Secondary | ICD-10-CM | POA: Diagnosis not present

## 2020-09-27 DIAGNOSIS — I400 Infective myocarditis: Secondary | ICD-10-CM | POA: Diagnosis not present

## 2020-09-27 DIAGNOSIS — D649 Anemia, unspecified: Secondary | ICD-10-CM | POA: Diagnosis not present

## 2020-09-27 DIAGNOSIS — Z95811 Presence of heart assist device: Secondary | ICD-10-CM | POA: Diagnosis not present

## 2020-09-27 DIAGNOSIS — G253 Myoclonus: Secondary | ICD-10-CM | POA: Diagnosis not present

## 2020-09-27 DIAGNOSIS — I5022 Chronic systolic (congestive) heart failure: Secondary | ICD-10-CM | POA: Diagnosis not present

## 2020-09-27 DIAGNOSIS — I5023 Acute on chronic systolic (congestive) heart failure: Secondary | ICD-10-CM | POA: Diagnosis present

## 2020-09-27 DIAGNOSIS — G9340 Encephalopathy, unspecified: Secondary | ICD-10-CM | POA: Diagnosis not present

## 2020-09-27 DIAGNOSIS — Z951 Presence of aortocoronary bypass graft: Secondary | ICD-10-CM | POA: Diagnosis not present

## 2020-09-27 DIAGNOSIS — J811 Chronic pulmonary edema: Secondary | ICD-10-CM | POA: Diagnosis not present

## 2020-09-27 DIAGNOSIS — Z66 Do not resuscitate: Secondary | ICD-10-CM | POA: Diagnosis not present

## 2020-09-27 DIAGNOSIS — J690 Pneumonitis due to inhalation of food and vomit: Secondary | ICD-10-CM | POA: Diagnosis present

## 2020-09-27 DIAGNOSIS — J90 Pleural effusion, not elsewhere classified: Secondary | ICD-10-CM | POA: Diagnosis not present

## 2020-09-27 DIAGNOSIS — Z515 Encounter for palliative care: Secondary | ICD-10-CM | POA: Diagnosis not present

## 2020-09-27 DIAGNOSIS — K7200 Acute and subacute hepatic failure without coma: Secondary | ICD-10-CM | POA: Diagnosis present

## 2020-09-27 DIAGNOSIS — E113512 Type 2 diabetes mellitus with proliferative diabetic retinopathy with macular edema, left eye: Secondary | ICD-10-CM | POA: Diagnosis present

## 2020-09-27 DIAGNOSIS — I272 Pulmonary hypertension, unspecified: Secondary | ICD-10-CM | POA: Diagnosis present

## 2020-09-27 DIAGNOSIS — B3322 Viral myocarditis: Secondary | ICD-10-CM | POA: Diagnosis present

## 2020-09-27 DIAGNOSIS — Z4682 Encounter for fitting and adjustment of non-vascular catheter: Secondary | ICD-10-CM | POA: Diagnosis not present

## 2020-09-27 DIAGNOSIS — E669 Obesity, unspecified: Secondary | ICD-10-CM | POA: Diagnosis present

## 2020-09-27 DIAGNOSIS — D709 Neutropenia, unspecified: Secondary | ICD-10-CM | POA: Diagnosis present

## 2020-09-27 DIAGNOSIS — N250 Renal osteodystrophy: Secondary | ICD-10-CM | POA: Diagnosis not present

## 2020-09-27 DIAGNOSIS — E1110 Type 2 diabetes mellitus with ketoacidosis without coma: Secondary | ICD-10-CM | POA: Diagnosis not present

## 2020-09-27 HISTORY — PX: LEFT HEART CATH AND CORONARY ANGIOGRAPHY: CATH118249

## 2020-09-27 HISTORY — PX: IABP INSERTION: CATH118242

## 2020-09-27 HISTORY — PX: RIGHT HEART CATH: CATH118263

## 2020-09-27 HISTORY — PX: CORONARY/GRAFT ACUTE MI REVASCULARIZATION: CATH118305

## 2020-09-27 LAB — CBC
HCT: 35.2 % — ABNORMAL LOW (ref 39.0–52.0)
Hemoglobin: 10.9 g/dL — ABNORMAL LOW (ref 13.0–17.0)
MCH: 35.3 pg — ABNORMAL HIGH (ref 26.0–34.0)
MCHC: 31 g/dL (ref 30.0–36.0)
MCV: 113.9 fL — ABNORMAL HIGH (ref 80.0–100.0)
Platelets: 184 10*3/uL (ref 150–400)
RBC: 3.09 MIL/uL — ABNORMAL LOW (ref 4.22–5.81)
RDW: 16.5 % — ABNORMAL HIGH (ref 11.5–15.5)
WBC: 8.6 10*3/uL (ref 4.0–10.5)
nRBC: 1 % — ABNORMAL HIGH (ref 0.0–0.2)

## 2020-09-27 LAB — POCT I-STAT EG7
Acid-base deficit: 11 mmol/L — ABNORMAL HIGH (ref 0.0–2.0)
Acid-base deficit: 12 mmol/L — ABNORMAL HIGH (ref 0.0–2.0)
Bicarbonate: 16.2 mmol/L — ABNORMAL LOW (ref 20.0–28.0)
Bicarbonate: 15.6 mmol/L — ABNORMAL LOW (ref 20.0–28.0)
Calcium, Ion: 0.97 mmol/L — ABNORMAL LOW (ref 1.15–1.40)
Calcium, Ion: 0.96 mmol/L — ABNORMAL LOW (ref 1.15–1.40)
HCT: 43 % (ref 39.0–52.0)
HCT: 43 % (ref 39.0–52.0)
Hemoglobin: 14.6 g/dL (ref 13.0–17.0)
Hemoglobin: 14.6 g/dL (ref 13.0–17.0)
O2 Saturation: 65 %
O2 Saturation: 62 %
Potassium: 5.7 mmol/L — ABNORMAL HIGH (ref 3.5–5.1)
Potassium: 5.7 mmol/L — ABNORMAL HIGH (ref 3.5–5.1)
Sodium: 133 mmol/L — ABNORMAL LOW (ref 135–145)
Sodium: 133 mmol/L — ABNORMAL LOW (ref 135–145)
TCO2: 17 mmol/L — ABNORMAL LOW (ref 22–32)
TCO2: 17 mmol/L — ABNORMAL LOW (ref 22–32)
pCO2, Ven: 39.4 mmHg — ABNORMAL LOW (ref 44.0–60.0)
pCO2, Ven: 38.8 mmHg — ABNORMAL LOW (ref 44.0–60.0)
pH, Ven: 7.222 — ABNORMAL LOW (ref 7.250–7.430)
pH, Ven: 7.213 — ABNORMAL LOW (ref 7.250–7.430)
pO2, Ven: 40 mmHg (ref 32.0–45.0)
pO2, Ven: 39 mmHg (ref 32.0–45.0)

## 2020-09-27 LAB — ECHOCARDIOGRAM COMPLETE
AR max vel: 1.5 cm2
AV Area VTI: 1.39 cm2
AV Area mean vel: 1.41 cm2
AV Mean grad: 4 mmHg
AV Peak grad: 7.8 mmHg
AV Vena cont: 0.2 cm
Ao pk vel: 1.4 m/s
Area-P 1/2: 7.37 cm2
Calc EF: 24.5 %
Height: 74 in
MV M vel: 4.27 m/s
MV Peak grad: 72.9 mmHg
Radius: 0.4 cm
S' Lateral: 5.5 cm
Single Plane A2C EF: 19.5 %
Single Plane A4C EF: 27.5 %
Weight: 3199.32 oz

## 2020-09-27 LAB — COMPREHENSIVE METABOLIC PANEL
ALT: 211 U/L — ABNORMAL HIGH (ref 0–44)
AST: 314 U/L — ABNORMAL HIGH (ref 15–41)
Albumin: 3.3 g/dL — ABNORMAL LOW (ref 3.5–5.0)
Alkaline Phosphatase: 101 U/L (ref 38–126)
Anion gap: 32 — ABNORMAL HIGH (ref 5–15)
BUN: 48 mg/dL — ABNORMAL HIGH (ref 8–23)
CO2: 14 mmol/L — ABNORMAL LOW (ref 22–32)
Calcium: 8.7 mg/dL — ABNORMAL LOW (ref 8.9–10.3)
Chloride: 91 mmol/L — ABNORMAL LOW (ref 98–111)
Creatinine, Ser: 7.38 mg/dL — ABNORMAL HIGH (ref 0.61–1.24)
GFR, Estimated: 7 mL/min — ABNORMAL LOW (ref >60)
Glucose, Bld: 62 mg/dL — ABNORMAL LOW (ref 70–99)
Potassium: 5.3 mmol/L — ABNORMAL HIGH (ref 3.5–5.1)
Sodium: 137 mmol/L (ref 135–145)
Total Bilirubin: 2.4 mg/dL — ABNORMAL HIGH (ref 0.3–1.2)
Total Protein: 5.6 g/dL — ABNORMAL LOW (ref 6.5–8.1)

## 2020-09-27 LAB — POCT I-STAT 7, (LYTES, BLD GAS, ICA,H+H)
Acid-Base Excess: 6 mmol/L — ABNORMAL HIGH (ref 0.0–2.0)
Acid-Base Excess: 4 mmol/L — ABNORMAL HIGH (ref 0.0–2.0)
Acid-base deficit: 12 mmol/L — ABNORMAL HIGH (ref 0.0–2.0)
Acid-base deficit: 5 mmol/L — ABNORMAL HIGH (ref 0.0–2.0)
Bicarbonate: 13.8 mmol/L — ABNORMAL LOW (ref 20.0–28.0)
Bicarbonate: 19.1 mmol/L — ABNORMAL LOW (ref 20.0–28.0)
Bicarbonate: 27.1 mmol/L (ref 20.0–28.0)
Bicarbonate: 27.4 mmol/L (ref 20.0–28.0)
Calcium, Ion: 0.95 mmol/L — ABNORMAL LOW (ref 1.15–1.40)
Calcium, Ion: 0.97 mmol/L — ABNORMAL LOW (ref 1.15–1.40)
Calcium, Ion: 0.96 mmol/L — ABNORMAL LOW (ref 1.15–1.40)
Calcium, Ion: 1.07 mmol/L — ABNORMAL LOW (ref 1.15–1.40)
HCT: 43 % (ref 39.0–52.0)
HCT: 37 % — ABNORMAL LOW (ref 39.0–52.0)
HCT: 36 % — ABNORMAL LOW (ref 39.0–52.0)
HCT: 37 % — ABNORMAL LOW (ref 39.0–52.0)
Hemoglobin: 14.6 g/dL (ref 13.0–17.0)
Hemoglobin: 12.6 g/dL — ABNORMAL LOW (ref 13.0–17.0)
Hemoglobin: 12.2 g/dL — ABNORMAL LOW (ref 13.0–17.0)
Hemoglobin: 12.6 g/dL — ABNORMAL LOW (ref 13.0–17.0)
O2 Saturation: 100 %
O2 Saturation: 100 %
O2 Saturation: 100 %
O2 Saturation: 100 %
Patient temperature: 35.3
Patient temperature: 36.3
Patient temperature: 37
Potassium: 5.8 mmol/L — ABNORMAL HIGH (ref 3.5–5.1)
Potassium: 4.5 mmol/L (ref 3.5–5.1)
Potassium: 3.8 mmol/L (ref 3.5–5.1)
Potassium: 4 mmol/L (ref 3.5–5.1)
Sodium: 132 mmol/L — ABNORMAL LOW (ref 135–145)
Sodium: 133 mmol/L — ABNORMAL LOW (ref 135–145)
Sodium: 136 mmol/L (ref 135–145)
Sodium: 138 mmol/L (ref 135–145)
TCO2: 15 mmol/L — ABNORMAL LOW (ref 22–32)
TCO2: 20 mmol/L — ABNORMAL LOW (ref 22–32)
TCO2: 28 mmol/L (ref 22–32)
TCO2: 28 mmol/L (ref 22–32)
pCO2 arterial: 30 mmHg — ABNORMAL LOW (ref 32.0–48.0)
pCO2 arterial: 31.1 mmHg — ABNORMAL LOW (ref 32.0–48.0)
pCO2 arterial: 27.6 mmHg — ABNORMAL LOW (ref 32.0–48.0)
pCO2 arterial: 35.2 mmHg (ref 32.0–48.0)
pH, Arterial: 7.27 — ABNORMAL LOW (ref 7.350–7.450)
pH, Arterial: 7.388 (ref 7.350–7.450)
pH, Arterial: 7.597 — ABNORMAL HIGH (ref 7.350–7.450)
pH, Arterial: 7.5 — ABNORMAL HIGH (ref 7.350–7.450)
pO2, Arterial: 511 mmHg — ABNORMAL HIGH (ref 83.0–108.0)
pO2, Arterial: 529 mmHg — ABNORMAL HIGH (ref 83.0–108.0)
pO2, Arterial: 165 mmHg — ABNORMAL HIGH (ref 83.0–108.0)
pO2, Arterial: 153 mmHg — ABNORMAL HIGH (ref 83.0–108.0)

## 2020-09-27 LAB — RENAL FUNCTION PANEL
Albumin: 3.4 g/dL — ABNORMAL LOW (ref 3.5–5.0)
Anion gap: 17 — ABNORMAL HIGH (ref 5–15)
BUN: 53 mg/dL — ABNORMAL HIGH (ref 8–23)
CO2: 24 mmol/L (ref 22–32)
Calcium: 8.6 mg/dL — ABNORMAL LOW (ref 8.9–10.3)
Chloride: 97 mmol/L — ABNORMAL LOW (ref 98–111)
Creatinine, Ser: 6.58 mg/dL — ABNORMAL HIGH (ref 0.61–1.24)
GFR, Estimated: 8 mL/min — ABNORMAL LOW (ref >60)
Glucose, Bld: 149 mg/dL — ABNORMAL HIGH (ref 70–99)
Phosphorus: 6.9 mg/dL — ABNORMAL HIGH (ref 2.5–4.6)
Potassium: 4 mmol/L (ref 3.5–5.1)
Sodium: 138 mmol/L (ref 135–145)

## 2020-09-27 LAB — GLUCOSE, CAPILLARY
Glucose-Capillary: 119 mg/dL — ABNORMAL HIGH (ref 70–99)
Glucose-Capillary: 124 mg/dL — ABNORMAL HIGH (ref 70–99)
Glucose-Capillary: 155 mg/dL — ABNORMAL HIGH (ref 70–99)
Glucose-Capillary: 202 mg/dL — ABNORMAL HIGH (ref 70–99)
Glucose-Capillary: 224 mg/dL — ABNORMAL HIGH (ref 70–99)
Glucose-Capillary: 236 mg/dL — ABNORMAL HIGH (ref 70–99)
Glucose-Capillary: 53 mg/dL — ABNORMAL LOW (ref 70–99)
Glucose-Capillary: 54 mg/dL — ABNORMAL LOW (ref 70–99)
Glucose-Capillary: 79 mg/dL (ref 70–99)

## 2020-09-27 LAB — MAGNESIUM: Magnesium: 2.4 mg/dL (ref 1.7–2.4)

## 2020-09-27 LAB — PHOSPHORUS: Phosphorus: 11.6 mg/dL — ABNORMAL HIGH (ref 2.5–4.6)

## 2020-09-27 LAB — LACTIC ACID, PLASMA
Lactic Acid, Venous: >11 mmol/L (ref 0.5–1.9)
Lactic Acid, Venous: 3.9 mmol/L (ref 0.5–1.9)

## 2020-09-27 LAB — MRSA NEXT GEN BY PCR, NASAL: MRSA by PCR Next Gen: NOT DETECTED

## 2020-09-27 LAB — TROPONIN I (HIGH SENSITIVITY)
Troponin I (High Sensitivity): 14693 ng/L (ref <18)
Troponin I (High Sensitivity): 18109 ng/L (ref <18)

## 2020-09-27 LAB — HEPARIN LEVEL (UNFRACTIONATED)
Heparin Unfractionated: 0.15 IU/mL — ABNORMAL LOW (ref 0.30–0.70)
Heparin Unfractionated: 0.3 IU/mL (ref 0.30–0.70)

## 2020-09-27 LAB — COOXEMETRY PANEL
Carboxyhemoglobin: 0.9 % (ref 0.5–1.5)
Methemoglobin: 1 % (ref 0.0–1.5)
O2 Saturation: 60.6 %
Total hemoglobin: 11.7 g/dL — ABNORMAL LOW (ref 12.0–16.0)

## 2020-09-27 SURGERY — CORONARY/GRAFT ACUTE MI REVASCULARIZATION
Anesthesia: LOCAL

## 2020-09-27 MED ORDER — PROSOURCE TF PO LIQD
45.0000 mL | Freq: Four times a day (QID) | ORAL | Status: DC
  Administered 2020-09-27 – 2020-09-29 (×10): 45 mL
  Filled 2020-09-27 (×10): qty 45

## 2020-09-27 MED ORDER — B COMPLEX-C PO TABS
1.0000 | ORAL_TABLET | Freq: Every day | ORAL | Status: DC
  Administered 2020-09-27 – 2020-09-29 (×3): 1
  Filled 2020-09-27 (×3): qty 1

## 2020-09-27 MED ORDER — VITAL 1.5 CAL PO LIQD
1000.0000 mL | ORAL | Status: DC
  Administered 2020-09-27: 1000 mL

## 2020-09-27 MED ORDER — ACETAMINOPHEN 325 MG PO TABS: 650.0000 mg | ORAL_TABLET | ORAL | Status: DC | PRN

## 2020-09-27 MED ORDER — FENTANYL BOLUS VIA INFUSION: 25.0000 ug | INTRAVENOUS | Status: DC | PRN

## 2020-09-27 MED ORDER — SODIUM CHLORIDE 0.9% FLUSH: 10.0000 mL | INTRAVENOUS | Status: DC | PRN

## 2020-09-27 MED ORDER — DEXTROSE 50 % IV SOLN
1.0000 | Freq: Once | INTRAVENOUS | Status: AC
  Administered 2020-09-27: 50 mL via INTRAVENOUS

## 2020-09-27 MED ORDER — MIDAZOLAM-SODIUM CHLORIDE 100-0.9 MG/100ML-% IV SOLN
0.0000 mg/h | INTRAVENOUS | Status: DC
  Administered 2020-09-27: 1 mg/h via INTRAVENOUS

## 2020-09-27 MED ORDER — ACETAMINOPHEN 160 MG/5ML PO SOLN: 650.0000 mg | ORAL | Status: DC | PRN

## 2020-09-27 MED ORDER — LIDOCAINE HCL (PF) 1 % IJ SOLN
INTRAMUSCULAR | Status: DC | PRN
  Administered 2020-09-27: 15 mL via INTRADERMAL

## 2020-09-27 MED ORDER — DEXMEDETOMIDINE HCL IN NACL 400 MCG/100ML IV SOLN
0.0000 ug/kg/h | INTRAVENOUS | Status: DC
Start: 2020-09-27 — End: 2020-09-30
  Administered 2020-09-27: 0.4 ug/kg/h via INTRAVENOUS
  Administered 2020-09-27: 0.5 ug/kg/h via INTRAVENOUS
  Administered 2020-09-27 – 2020-09-28 (×3): 0.6 ug/kg/h via INTRAVENOUS
  Administered 2020-09-28 – 2020-09-29 (×4): 0.7 ug/kg/h via INTRAVENOUS
  Administered 2020-09-30: 0.5 ug/kg/h via INTRAVENOUS
  Administered 2020-09-30: 0.4 ug/kg/h via INTRAVENOUS
  Filled 2020-09-27 (×9): qty 100
  Filled 2020-09-27: qty 200

## 2020-09-27 MED ORDER — HEPARIN (PORCINE) 2000 UNITS/L FOR CRRT
INTRAVENOUS_CENTRAL | Status: DC | PRN
  Filled 2020-09-27: qty 1000

## 2020-09-27 MED ORDER — SODIUM CHLORIDE 0.9% FLUSH: 3.0000 mL | Freq: Two times a day (BID) | INTRAVENOUS | Status: DC

## 2020-09-27 MED ORDER — FENTANYL BOLUS VIA INFUSION
INTRAVENOUS | Status: DC | PRN
  Administered 2020-09-27 (×2): 50 ug via INTRAVENOUS

## 2020-09-27 MED ORDER — SUCCINYLCHOLINE CHLORIDE 200 MG/10ML IV SOSY
PREFILLED_SYRINGE | INTRAVENOUS | Status: DC | PRN
  Administered 2020-09-27: 120 mg via INTRAVENOUS

## 2020-09-27 MED ORDER — PERFLUTREN LIPID MICROSPHERE
1.0000 mL | INTRAVENOUS | Status: AC | PRN
  Administered 2020-09-27: 2 mL via INTRAVENOUS
  Filled 2020-09-27: qty 10

## 2020-09-27 MED ORDER — HEPARIN (PORCINE) IN NACL 1000-0.9 UT/500ML-% IV SOLN
INTRAVENOUS | Status: AC
  Filled 2020-09-27: qty 500

## 2020-09-27 MED ORDER — NOREPINEPHRINE 4 MG/250ML-% IV SOLN
INTRAVENOUS | Status: AC
  Filled 2020-09-27: qty 250

## 2020-09-27 MED ORDER — ONDANSETRON HCL 4 MG/2ML IJ SOLN: 4.0000 mg | Freq: Four times a day (QID) | INTRAMUSCULAR | Status: DC | PRN

## 2020-09-27 MED ORDER — FENTANYL CITRATE (PF) 100 MCG/2ML IJ SOLN: 25.0000 ug | Freq: Once | INTRAMUSCULAR | Status: DC

## 2020-09-27 MED ORDER — DOBUTAMINE IN D5W 4-5 MG/ML-% IV SOLN
10.0000 ug/kg/min | INTRAVENOUS | Status: DC
  Administered 2020-09-27: 3 ug/kg/min via INTRAVENOUS
  Administered 2020-09-29 – 2020-09-30 (×2): 10 ug/kg/min via INTRAVENOUS
  Filled 2020-09-27 (×3): qty 250

## 2020-09-27 MED ORDER — FENTANYL CITRATE (PF) 100 MCG/2ML IJ SOLN
25.0000 ug | INTRAMUSCULAR | Status: DC | PRN
  Administered 2020-09-28: 75 ug via INTRAVENOUS

## 2020-09-27 MED ORDER — MIDAZOLAM HCL 2 MG/2ML IJ SOLN
1.0000 mg | INTRAMUSCULAR | Status: DC | PRN
  Administered 2020-09-29 – 2020-09-30 (×2): 1 mg via INTRAVENOUS
  Filled 2020-09-27 (×3): qty 2

## 2020-09-27 MED ORDER — MIDAZOLAM BOLUS VIA INFUSION
INTRAVENOUS | Status: DC | PRN
  Administered 2020-09-27 (×3): 2 mg via INTRAVENOUS
  Administered 2020-09-27: 1 mg via INTRAVENOUS

## 2020-09-27 MED ORDER — FENTANYL 2500MCG IN NS 250ML (10MCG/ML) PREMIX INFUSION
INTRAVENOUS | Status: AC
  Filled 2020-09-27: qty 250

## 2020-09-27 MED ORDER — "THROMBI-PAD 3""X3"" EX PADS"
1.0000 | MEDICATED_PAD | Freq: Once | CUTANEOUS | Status: AC
  Administered 2020-09-27: 1 via TOPICAL
  Filled 2020-09-27: qty 1

## 2020-09-27 MED ORDER — LABETALOL HCL 5 MG/ML IV SOLN: 10.0000 mg | INTRAVENOUS | Status: AC | PRN

## 2020-09-27 MED ORDER — SODIUM CHLORIDE 0.9 % IV SOLN: 250.0000 mL | INTRAVENOUS | Status: DC | PRN

## 2020-09-27 MED ORDER — INSULIN ASPART 100 UNIT/ML IJ SOLN
0.0000 [IU] | INTRAMUSCULAR | Status: DC
  Administered 2020-09-27: 3 [IU] via SUBCUTANEOUS
  Administered 2020-09-27: 1 [IU] via SUBCUTANEOUS
  Administered 2020-09-27: 3 [IU] via SUBCUTANEOUS
  Administered 2020-09-28 (×2): 1 [IU] via SUBCUTANEOUS
  Administered 2020-09-28: 3 [IU] via SUBCUTANEOUS
  Administered 2020-09-28 (×2): 2 [IU] via SUBCUTANEOUS
  Administered 2020-09-28: 1 [IU] via SUBCUTANEOUS
  Administered 2020-09-29 (×2): 3 [IU] via SUBCUTANEOUS
  Administered 2020-09-29: 4 [IU] via SUBCUTANEOUS
  Administered 2020-09-29: 2 [IU] via SUBCUTANEOUS
  Administered 2020-09-29: 3 [IU] via SUBCUTANEOUS
  Administered 2020-09-30: 2 [IU] via SUBCUTANEOUS
  Administered 2020-09-30: 1 [IU] via SUBCUTANEOUS
  Administered 2020-09-30: 2 [IU] via SUBCUTANEOUS

## 2020-09-27 MED ORDER — MIDAZOLAM-SODIUM CHLORIDE 100-0.9 MG/100ML-% IV SOLN
INTRAVENOUS | Status: AC
  Filled 2020-09-27: qty 100

## 2020-09-27 MED ORDER — HEPARIN (PORCINE) 25000 UT/250ML-% IV SOLN
1050.0000 [IU]/h | INTRAVENOUS | Status: DC
  Administered 2020-09-27 – 2020-09-29 (×3): 1000 [IU]/h via INTRAVENOUS
  Filled 2020-09-27 (×3): qty 250

## 2020-09-27 MED ORDER — PRISMASOL BGK 4/2.5 32-4-2.5 MEQ/L REPLACEMENT SOLN: Status: DC

## 2020-09-27 MED ORDER — IOHEXOL 350 MG/ML SOLN
INTRAVENOUS | Status: DC | PRN
  Administered 2020-09-27: 60 mL

## 2020-09-27 MED ORDER — ASPIRIN EC 81 MG PO TBEC: 81.0000 mg | DELAYED_RELEASE_TABLET | Freq: Every day | ORAL | Status: DC

## 2020-09-27 MED ORDER — HEPARIN (PORCINE) IN NACL 1000-0.9 UT/500ML-% IV SOLN
INTRAVENOUS | Status: DC | PRN
  Administered 2020-09-27 (×2): 500 mL

## 2020-09-27 MED ORDER — MIDAZOLAM HCL 2 MG/2ML IJ SOLN: INTRAMUSCULAR | Status: DC | PRN

## 2020-09-27 MED ORDER — PRISMASOL BGK 4/2.5 32-4-2.5 MEQ/L EC SOLN: Status: DC

## 2020-09-27 MED ORDER — HEPARIN SODIUM (PORCINE) 5000 UNIT/ML IJ SOLN: 5000.0000 [IU] | Freq: Three times a day (TID) | INTRAMUSCULAR | Status: DC

## 2020-09-27 MED ORDER — ASPIRIN 81 MG PO CHEW
81.0000 mg | CHEWABLE_TABLET | Freq: Every day | ORAL | Status: DC
  Administered 2020-09-27 – 2020-09-29 (×3): 81 mg
  Filled 2020-09-27 (×3): qty 1

## 2020-09-27 MED ORDER — HEPARIN SODIUM (PORCINE) 1000 UNIT/ML IJ SOLN
INTRAMUSCULAR | Status: AC
  Filled 2020-09-27: qty 1

## 2020-09-27 MED ORDER — DEXTROSE 50 % IV SOLN
50.0000 mL | INTRAVENOUS | Status: DC | PRN
  Filled 2020-09-27: qty 50

## 2020-09-27 MED ORDER — SODIUM CHLORIDE 0.9 % IV SOLN
500.0000 [IU]/h | INTRAVENOUS | Status: DC
  Administered 2020-09-27 – 2020-09-29 (×4): 500 [IU]/h via INTRAVENOUS_CENTRAL
  Filled 2020-09-27 (×5): qty 2

## 2020-09-27 MED ORDER — DEXTROSE 50 % IV SOLN
INTRAVENOUS | Status: AC
  Filled 2020-09-27: qty 50

## 2020-09-27 MED ORDER — FENTANYL 2500MCG IN NS 250ML (10MCG/ML) PREMIX INFUSION
0.0000 ug/h | INTRAVENOUS | Status: DC
  Administered 2020-09-27 – 2020-09-28 (×2): 150 ug/h via INTRAVENOUS
  Administered 2020-09-30: 50 ug/h via INTRAVENOUS
  Filled 2020-09-27 (×5): qty 250

## 2020-09-27 MED ORDER — POLYETHYLENE GLYCOL 3350 17 G PO PACK
17.0000 g | PACK | Freq: Every day | ORAL | Status: DC
  Administered 2020-09-27 – 2020-09-29 (×3): 17 g
  Filled 2020-09-27 (×3): qty 1

## 2020-09-27 MED ORDER — MIDAZOLAM HCL 2 MG/2ML IJ SOLN
1.0000 mg | INTRAMUSCULAR | Status: DC | PRN
Start: 2020-09-27 — End: 2020-09-30
  Filled 2020-09-27: qty 2

## 2020-09-27 MED ORDER — HEPARIN SODIUM (PORCINE) 1000 UNIT/ML IJ SOLN
INTRAMUSCULAR | Status: DC | PRN
  Administered 2020-09-27: 4000 [IU] via INTRAVENOUS

## 2020-09-27 MED ORDER — FENTANYL BOLUS VIA INFUSION
25.0000 ug | INTRAVENOUS | Status: DC | PRN
  Filled 2020-09-27: qty 100

## 2020-09-27 MED ORDER — HEPARIN SODIUM (PORCINE) 1000 UNIT/ML DIALYSIS
1000.0000 [IU] | INTRAMUSCULAR | Status: DC | PRN
  Filled 2020-09-27: qty 6

## 2020-09-27 MED ORDER — SODIUM CHLORIDE 0.9% FLUSH
10.0000 mL | Freq: Two times a day (BID) | INTRAVENOUS | Status: DC
  Administered 2020-09-27 – 2020-09-30 (×6): 10 mL

## 2020-09-27 MED ORDER — SODIUM CHLORIDE 0.9 % IV SOLN
250.0000 mL | INTRAVENOUS | Status: DC | PRN
  Administered 2020-09-27: 250 mL via INTRAVENOUS

## 2020-09-27 MED ORDER — NITROGLYCERIN IN D5W 200-5 MCG/ML-% IV SOLN
INTRAVENOUS | Status: DC | PRN
  Administered 2020-09-27: 20 ug/min via INTRAVENOUS

## 2020-09-27 MED ORDER — HEPARIN (PORCINE) IN NACL 1000-0.9 UT/500ML-% IV SOLN
INTRAVENOUS | Status: AC
  Filled 2020-09-27: qty 1000

## 2020-09-27 MED ORDER — SODIUM CHLORIDE 0.9% FLUSH: 3.0000 mL | INTRAVENOUS | Status: DC | PRN

## 2020-09-27 MED ORDER — ORAL CARE MOUTH RINSE
15.0000 mL | OROMUCOSAL | Status: DC
  Administered 2020-09-27 – 2020-09-30 (×32): 15 mL via OROMUCOSAL

## 2020-09-27 MED ORDER — FENTANYL 2500MCG IN NS 250ML (10MCG/ML) PREMIX INFUSION
25.0000 ug/h | INTRAVENOUS | Status: DC
  Administered 2020-09-27: 50 ug/h via INTRAVENOUS

## 2020-09-27 MED ORDER — MIDAZOLAM BOLUS VIA INFUSION: 0.0000 mg | INTRAVENOUS | Status: DC | PRN

## 2020-09-27 MED ORDER — FENTANYL 2500MCG IN NS 250ML (10MCG/ML) PREMIX INFUSION: 25.0000 ug/h | INTRAVENOUS | Status: DC

## 2020-09-27 MED ORDER — NITROGLYCERIN IN D5W 200-5 MCG/ML-% IV SOLN
INTRAVENOUS | Status: AC
  Filled 2020-09-27: qty 250

## 2020-09-27 MED ORDER — SODIUM CHLORIDE 0.9% FLUSH
3.0000 mL | Freq: Two times a day (BID) | INTRAVENOUS | Status: DC
  Administered 2020-09-29: 3 mL via INTRAVENOUS

## 2020-09-27 MED ORDER — PANTOPRAZOLE SODIUM 40 MG PO PACK
40.0000 mg | PACK | Freq: Every day | ORAL | Status: DC
  Administered 2020-09-28 – 2020-09-29 (×2): 40 mg
  Filled 2020-09-27 (×2): qty 20

## 2020-09-27 MED ORDER — CHLORHEXIDINE GLUCONATE CLOTH 2 % EX PADS
6.0000 | MEDICATED_PAD | Freq: Every day | CUTANEOUS | Status: DC
  Administered 2020-09-27 – 2020-09-30 (×5): 6 via TOPICAL

## 2020-09-27 MED ORDER — HYDRALAZINE HCL 20 MG/ML IJ SOLN: 10.0000 mg | INTRAMUSCULAR | Status: AC | PRN

## 2020-09-27 MED ORDER — LIDOCAINE HCL (PF) 1 % IJ SOLN
INTRAMUSCULAR | Status: AC
  Filled 2020-09-27: qty 30

## 2020-09-27 MED ORDER — PANTOPRAZOLE SODIUM 40 MG IV SOLR
40.0000 mg | INTRAVENOUS | Status: DC
  Administered 2020-09-27: 40 mg via INTRAVENOUS
  Filled 2020-09-27: qty 40

## 2020-09-27 MED ORDER — ETOMIDATE 2 MG/ML IV SOLN
INTRAVENOUS | Status: DC | PRN
  Administered 2020-09-27: 20 mg via INTRAVENOUS

## 2020-09-27 MED ORDER — CHLORHEXIDINE GLUCONATE 0.12% ORAL RINSE (MEDLINE KIT)
15.0000 mL | Freq: Two times a day (BID) | OROMUCOSAL | Status: DC
  Administered 2020-09-27 – 2020-09-30 (×7): 15 mL via OROMUCOSAL

## 2020-09-27 MED ORDER — FENTANYL CITRATE (PF) 100 MCG/2ML IJ SOLN: 25.0000 ug | INTRAMUSCULAR | Status: DC | PRN

## 2020-09-27 MED ORDER — NOREPINEPHRINE 16 MG/250ML-% IV SOLN
0.0000 ug/min | INTRAVENOUS | Status: DC
Start: 2020-09-27 — End: 2020-09-30
  Administered 2020-09-27: 5 ug/min via INTRAVENOUS
  Administered 2020-09-29: 52 ug/min via INTRAVENOUS
  Administered 2020-09-29: 32 ug/min via INTRAVENOUS
  Administered 2020-09-29: 12 ug/min via INTRAVENOUS
  Administered 2020-09-30: 70 ug/min via INTRAVENOUS
  Administered 2020-09-30: 62 ug/min via INTRAVENOUS
  Administered 2020-09-30: 66 ug/min via INTRAVENOUS
  Filled 2020-09-27 (×5): qty 250
  Filled 2020-09-27: qty 500
  Filled 2020-09-27: qty 250

## 2020-09-27 MED ORDER — DOCUSATE SODIUM 50 MG/5ML PO LIQD
100.0000 mg | Freq: Two times a day (BID) | ORAL | Status: DC
  Administered 2020-09-27 – 2020-09-29 (×5): 100 mg
  Filled 2020-09-27 (×5): qty 10

## 2020-09-27 SURGICAL SUPPLY — 16 items
BALLN IABP SENSA PLUS 8F 50CC (BALLOONS) ×2
BALLOON IABP SENS PLUS 8F 50CC (BALLOONS) IMPLANT
CATH INFINITI 5FR AL1 (CATHETERS) ×1 IMPLANT
CATH INFINITI 5FR MULTPACK ANG (CATHETERS) ×1 IMPLANT
CATH SWAN GANZ VIP 7.5F (CATHETERS) ×1 IMPLANT
KIT ENCORE 26 ADVANTAGE (KITS) ×1 IMPLANT
KIT HEART LEFT (KITS) ×2 IMPLANT
KIT HEMO VALVE WATCHDOG (MISCELLANEOUS) ×1 IMPLANT
PACK CARDIAC CATHETERIZATION (CUSTOM PROCEDURE TRAY) ×2 IMPLANT
SHEATH PINNACLE 6F 10CM (SHEATH) ×1 IMPLANT
SHEATH PINNACLE 8F 10CM (SHEATH) ×1 IMPLANT
SLEEVE REPOSITIONING LENGTH 30 (MISCELLANEOUS) ×1 IMPLANT
TRANSDUCER W/STOPCOCK (MISCELLANEOUS) ×2 IMPLANT
TUBING CIL FLEX 10 FLL-RA (TUBING) ×2 IMPLANT
WIRE EMERALD 3MM-J .025X260CM (WIRE) ×1 IMPLANT
WIRE EMERALD 3MM-J .035X150CM (WIRE) ×1 IMPLANT

## 2020-09-27 NOTE — Procedures (Signed)
Arterial Catheter Insertion Procedure Note  Henry Carroll  [id number]  04/15/1950  Date:10/11/2020  Time:3:06 PM    Provider Performing: Candee Furbish    Procedure: Insertion of Arterial Line [id number]) without US guidance  Indication(s) Blood pressure monitoring and/or need for frequent ABGs  Consent Unable to obtain consent due to emergent nature of procedure.  Anesthesia None   Time Out Verified patient identification, verified procedure, site/side was marked, verified correct patient position, special equipment/implants available, medications/allergies/relevant history reviewed, required imaging and test results available.   Sterile Technique Maximal sterile technique including full sterile barrier drape, hand hygiene, sterile gown, sterile gloves, mask, hair covering, sterile ultrasound probe cover (if used).   Procedure Description Area of catheter insertion was cleaned with chlorhexidine and draped in sterile fashion. Without real-time ultrasound guidance an arterial catheter was placed into the right radial artery.  Appropriate arterial tracings confirmed on monitor.     Complications/Tolerance None; patient tolerated the procedure well.   EBL Minimal   Specimen(s) None

## 2020-09-27 NOTE — ED Notes (Signed)
Cardiologist, Humphrey Rolls MD to bedside for patient. Code STEMI activated on patient per Humphrey Rolls, MD. Patient HR sustaining in 140s and c/o "acid reflux" type of pain. RN awaiting call from Cath Lab. Vitals cycled q15 and RN to continue to monitor at this time.

## 2020-09-27 NOTE — H&P (Addendum)
Cardiology Admission History and Physical:   Patient ID: Henry Carroll MRN: [id number]; DOB: 28-Sep-1950   Admission date: 09/25/2020  PCP:  [name], NP   [facility] Providers Cardiologist:  Candee Furbish, MD        Chief Complaint:  Chest pressure and abdominal pain  History of Present Illness:   Henry Carroll with pmh sx for ESRD on HD (most recently dialyzed today), CHF with reduced ejection fraction (latest echo 40%) CAD s/p 4 vessel CABG who presents as a transfer from [facility] emergency department due to concern for cardiogenic shock. He states he was doing fine till last week when he was in [place]. Since then has been feeling fatigued; some chest pressure along with abdominal pain which worsened after dialysis this morning which made him go to the ED. There he was found to have a lactate of 11; cool extremities concerning for decreased perfusion.  He was tachycardic with a narrow pulse pressure. Bedside echo showed EF of 10% hence transferred here for further care. On route, had some more chest pressure for which he was given 2 NTG which resolved the pain. Also complains of abdominal pain now. Sinus tachy to 140s.    Past Medical History:  Diagnosis Date   Abnormal stress test 02/10/2015   Acute on chronic systolic and diastolic heart failure, NYHA class 1 ([place]) 12/31/2014   Anemia    Anemia in chronic kidney disease 09/03/2012   Arthritis    left  5th finger   Asthma    Asthma, chronic 06/04/2012   Asthma, chronic, mild intermittent, with acute exacerbation 12/22/2015   Bilateral lower extremity edema 12/22/2015   CAD (coronary artery disease) 02/18/2015   Chronic combined systolic (congestive) and diastolic (congestive) heart failure ([place])    a. 12/31/14: 2D ECHO: EF 40-45%, HK of inf myocardium, G1DD, mod MR   Chronic combined systolic and diastolic CHF (congestive heart failure) ([place]) 85/88/5027   Chronic systolic heart failure ([place]) 02/25/2015   CKD  (chronic kidney disease), stage III (HCC)    stage 3 kidney disease   CKD (chronic kidney disease), stage V ([place]) 08/30/2018   COLONIC POLYPS, HX OF 04/13/2009   Qualifier: Diagnosis of  By: Westly Pam.    Coronary artery disease    a. LHC 01/2015 - triple vessel CAD (mod oLM, mLAD, severe mRCA, intermediate branch stenosis, CTO of mCx). Plan CABG 02/2015.   Demand ischemia ([place]) 12/31/2014   DM type 2, uncontrolled, with renal complications ([place]) 7/41/2878   Dyspnea     due to fluid   Elevated troponin    Essential hypertension 6/76/7209   Folliculitis of perineum 10/01/2012   Gastric erosion    GERD (gastroesophageal reflux disease)    History of blood transfusion    Hyperlipidemia    Hyperlipidemia with target LDL less than 100 12/31/2014   Hypertension    Hypertensive heart disease with heart failure (HCC) 02/25/2015   IDA (iron deficiency anemia) 01/23/2016   Melena 01/23/2016   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   Mitral regurgitation    a. Mild-mod by echo 12/2014.   Morbid obesity ([place]) 12/29/2014   Myocardial infarction [facility])    pt. states per [name] he has in the past   NSVT (nonsustained ventricular tachycardia) ([place])    a. 9 beats during 01/2015 adm. BB titrated.   Nuclear sclerotic cataract of left eye 05/26/2019   Nuclear sclerotic cataract of right eye 05/26/2019  Obesity    a. BMI 33   Obesity (BMI 30-39.9) 05/05/2013   OSA (obstructive sleep apnea) 01/16/2018   Mild obstructive sleep apnea overall with an AHI of 7.3/h and no significant central sleep apnea. Severe obstructive sleep apnea during REM sleep with an AHI of 34.3/h.  Now on CPAP at 6 cm H2O.    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   Type II diabetes mellitus ([place])    Vitreous hemorrhage of right eye ([place]) 05/26/2019   Wears dentures     Past Surgical History:  Procedure Laterality Date   BACK SURGERY     BASCILIC VEIN TRANSPOSITION Left 11/14/2018   Procedure: FIRST STAGE BASILIC VEIN TRANSPOSITION LEFT ARM;  Surgeon: Serafina Mitchell, MD;  Location: [place];  Service: Vascular;  Laterality: Left;   [place] Left 01/28/2019   Procedure: BASILIC VEIN TRANSPOSITION SECOND STAGE;  Surgeon: Serafina Mitchell, MD;  Location: [place];  Service: Vascular;  Laterality: Left;   BONE MARROW BIOPSY     CARDIAC CATHETERIZATION N/A 02/09/2015   Procedure: Left Heart Cath and Coronary Angiography;  Surgeon: Burnell Blanks, MD;  Location: [place] [facility];  Service: Cardiovascular;  Laterality: N/A;   CATARACT EXTRACTION Bilateral 06/11/2020   COLONOSCOPY  2011   Dr. [name]: Ileocecal valve appeared normal, scattered pancolonic diverticulosis, difficult bowel prep making smaller lesions potentially missed. Recommended three-year follow-up colonoscopy.   COLONOSCOPY  2003   Dr. [name]: Suspicious lesion at the ileocecal valve, multiple biopsies benign, pancolonic diverticulosis.   COLONOSCOPY N/A 02/15/2016   diverticulosis in sigmoid and descending colon, single 5 mm polyp at splenic flexure (Tubular adenoma)   CORONARY ARTERY BYPASS GRAFT N/A 02/18/2015   Procedure: CORONARY ARTERY BYPASS GRAFTING (CABG) x  four, using bilateral internal mammary arteries and right leg greater saphenous vein harvested endoscopically;  Surgeon: [name], MD;  Location: [place];  Service: Open Heart Surgery;  Laterality: N/A;   ESOPHAGOGASTRODUODENOSCOPY N/A 02/15/2016   normal   EYE SURGERY Right    July 2019    EYE SURGERY Left 06/2020   GIVENS CAPSULE STUDY N/A 01/08/2017   couple of gastric and small bowel eroions in setting of aspirin 81 mg daily but nothing concerning, continue Hematology follow-up    HERNIA REPAIR  5681   Umbilical   LUMBAR [place]  2006   L4 & L5   REFRACTIVE SURGERY Left    2019    REFRACTIVE SURGERY Right    2019   TEE WITHOUT CARDIOVERSION N/A 02/18/2015   Procedure: TRANSESOPHAGEAL ECHOCARDIOGRAM (TEE);  Surgeon: [name], MD;  Location: [place];  Service: Open Heart Surgery;  Laterality: N/A;   TONSILLECTOMY  1962     Medications Prior to Admission: Prior to Admission medications   Medication Sig Start Date End Date Taking? Authorizing Provider  acetaminophen (TYLENOL) 500 MG tablet Take 1,000 mg by mouth as needed.    [provider]  albuterol (PROVENTIL) (2.5 MG/3ML) 0.083% nebulizer solution Take 3 mLs (2.5 mg total) by nebulization every 6 (six) hours as needed for wheezing or shortness of breath. 09/14/20   [name], NP  amLODipine (NORVASC) 10 MG tablet Take 1 tablet by mouth once  daily 01/25/20   [name], NP  aspirin EC 81 MG tablet Take 1 tablet (81 mg total) by mouth daily. 01/24/16   Burtis Junes, NP  atorvastatin (LIPITOR) 40 MG tablet TAKE 1 TABLET BY MOUTH ONCE DAILY IN THE EVENING 02/03/20   [name], NP  carvedilol (COREG) 12.5 MG tablet Take 12.5 mg by mouth daily.  08/26/19   [provider]  Cholecalciferol (VITAMIN D3) 25 MCG (1000 UT) CAPS Take 2 capsules by mouth daily. 08/26/19   [provider]  Cinnamon 500 MG capsule Take 1,000 mg by mouth daily.     [provider]  Coenzyme Q10 10 MG capsule Take 10 mg by mouth daily. 08/26/19   [provider]  epoetin alfa-epbx (RETACRIT) 75883 UNIT/ML injection 20,000 Units every 14 (fourteen) days.     Edrick Oh, MD  ezetimibe (ZETIA) 10 MG tablet Take 1 tablet (10 mg total) by mouth daily. 10/07/19 02/12/58  Burtis Junes, NP  fluticasone (FLONASE) 50 MCG/ACT nasal spray Place 2 sprays into both nostrils daily as needed for allergies or rhinitis (SEASONAL ALLERGIES).    [provider]   fluticasone-salmeterol (ADVAIR DISKUS) 100-50 MCG/ACT AEPB Inhale 1 puff into the lungs 2 (two) times daily. 09/14/20   [name], NP  furosemide (LASIX) 40 MG tablet Take 40 mg by mouth. On S-M-W-F Takes  1 tablet by mouth once d    [provider]  glucose blood (ONE TOUCH ULTRA TEST) test strip Use to test blood sugar three times daily. Dx: E11.21 12/04/17   Gildardo Cranker, DO  insulin detemir (LEVEMIR) 100 UNIT/ML injection Inject 0.18 mLs (18 Units total) into the skin daily. APPOINTMENT OVERDUE 09/12/20   [name], NP  isosorbide dinitrate (ISORDIL) 10 MG tablet Take by mouth.    [provider]  montelukast (SINGULAIR) 10 MG tablet TAKE 1 TABLET BY MOUTH AT BEDTIME 02/03/20   [name], NP  Multiple Vitamin (MULTIVITAMIN WITH MINERALS) TABS tablet Take 1 tablet by mouth daily.    [provider]  nitroGLYCERIN (NITROSTAT) 0.4 MG SL tablet Place 1 tablet (0.4 mg total) under the tongue every 5 (five) minutes as needed for chest pain. 09/13/20 10/03/21  Jerline Pain, MD  PROAIR HFA 108 [phone] Base) MCG/ACT inhaler INHALE 2 PUFFS BY MOUTH EVERY 6 HOURS AS NEEDED FOR SHORTNESS OF BREATH 09/14/20   [name], NP  vitamin E 45 MG (100 UNITS) capsule Take by mouth.    [provider]     Allergies:  None   Social History:   Social History   Socioeconomic History   Marital status: Married    Spouse name: Not on file   Number of children: Not on file   Years of education: Not on file   Highest education level: Not on file  Occupational History   Not on file  Tobacco Use   Smoking status: Former    Types: Cigars    Quit date: 12/31/2014    Years since quitting: 5.7   Smokeless tobacco: Never  Vaping Use   Vaping Use: Never used  Substance and Sexual Activity   Alcohol use: Not Currently    Alcohol/week: 3.0 standard drinks    Types: 3 Glasses of wine per week   Drug use: No   Sexual activity: Yes    Birth  control/protection: None  Other Topics Concern   Not on file  Social History Narrative  Not on file   Social Determinants of Health   Financial Resource Strain: Not on file  Food Insecurity: Not on file  Transportation Needs: Not on file  Physical Activity: Not on file  Stress: Not on file  Social Connections: Not on file  Intimate Partner Violence: Not on file    Family History:   The patient's family history includes Colon cancer in his paternal uncle; Diabetes in his mother; Heart disease in his father and mother; Hypertension in his father; Kidney disease in his daughter; Stroke in his sister; Sudden death in his daughter. There is no history of Heart attack.    ROS:  Please see the history of present illness.  All other ROS reviewed and negative.     Physical Exam/Data:   Vitals:   09/19/2020 2330 10/07/2020 2345 10/06/2020 0000 10/07/2020 0015  BP: 96/78 105/83 (!) 93/56 (!) 109/92  Pulse:  (!) 140 (!) 133 (!) 137  Resp: _0 (!) 23  Temp:      TempSrc:      SpO2:  99% 99% 100%  Weight:      Height:        Intake/Output Summary (Last 24 hours) at 10/11/2020 0045 Last data filed at 10/08/2020 2036 Gross per 24 hour  Intake 500 ml  Output --  Net 500 ml   Last 3 Weights 09/25/2020 09/14/2020 07/13/2020  Weight (lbs) 200 lb 9.9 oz 202 lb 201 lb  Weight (kg) 91 kg 91.627 kg 91.173 kg     Body mass index is 25.76 kg/m.  General:  Well nourished, well developed, in moderate distress HEENT: normal Lymph: no adenopathy Neck: Has elevated JVD Endocrine:  No thryomegaly Vascular: No carotid bruits; FA pulses 2+ bilaterally without bruits  Cardiac:  normal S1, S2; RRR; no murmur  Lungs: Poor airflow Abd: soft, moderately tender, no hepatomegaly  Ext: cool extremities. Musculoskeletal:  No deformities, BUE and BLE strength normal and equal. Has fistula on left.  Skin: warm and dry  Neuro:  CNs 2-12 intact, no focal abnormalities noted Psych:  Normal affect    EKG:  Sinus tachycardia.   Laboratory Data:  High Sensitivity Troponin:   Recent Labs  Lab 10/07/2020 2024  TROPONINIHS 6,552*      Chemistry Recent Labs  Lab 10/11/2020 2024 09/29/2020 2247  NA 133* 133*  K 5.1 4.7  CL 92*  --   CO2 11*  --   GLUCOSE 180*  --   BUN 47*  --   CREATININE 6.80*  --   CALCIUM 9.0  --   GFRNONAA 8*  --   ANIONGAP 30*  --     Recent Labs  Lab 10/02/2020 2024  PROT 6.6  ALBUMIN 3.8  AST 99*  ALT <5  ALKPHOS 123  BILITOT 2.1*   Hematology Recent Labs  Lab 09/18/2020 2024 10/08/2020 2247  WBC 7.6  --   RBC 3.54*  --   HGB 12.7* 13.3  HCT 38.9* 39.0  MCV 109.9*  --   MCH 35.9*  --   MCHC 32.6  --   RDW 16.1*  --   PLT 239  --    BNPNo results for input(s): BNP, PROBNP in the last 168 hours.  DDimer No results for input(s): DDIMER in the last 168 hours.   Radiology/Studies:  DG Chest Portable 1 View  Result Date: 09/25/2020 CLINICAL DATA:  Cough EXAM: PORTABLE CHEST 1 VIEW COMPARISON:  06/03/2020 FINDINGS: Post sternotomy changes. Cardiomegaly. Aortic atherosclerosis.  Possible tiny left effusion. No pneumothorax IMPRESSION: Cardiomegaly with possible trace left effusion Electronically Signed   By: Donavan Foil M.D.   On: 10/10/2020 20:52     Assessment and Plan:   # NSTEMI # Cardiogenic Shock # Severe CAD  In the setting of cool extremities, sinus tachycardia, narrow pulse pressure and elevated lactate, reasonable to activate the cath lab for LHC to assess coronary anatomy and possible IABP. Will also put Gordy Councilman to assess hemodynamics Hold his home HF meds including BB considering his shock Based on Swan numbers, will titrate his inopressors Aspirin, Statin and Heparin for possible ACS  HF/Cardiogenic shock team consult. However not a candidate most likely for advanced therapies due to age, ESRD Formal Echo in AM  # ESRD -Renal consult in AM -Depending on pressures, may need CRRT  # Abdominal pain -Most likely congestive  hepatopathy -However send blood cultures, consider CT Abdomen if pain persists to rule out other etiologies  # DM2:  -Insulin per protocol  # HTN: -Hold BP meds   For questions or updates, please contact [facility] Please consult [email or web address] for contact info under     Signed, Jaci Lazier, MD  09/12/2020 12:45 AM    I have examined the patient and reviewed assessment and plan and discussed with patient.  Agree with above as stated.    I discussed the case with Dr. Humphrey Rolls and subsequently Dr. Haroldine Laws at length.  Given his presentation, we felt like ventricular support was required.  Given that he is a dialysis patient, there are limited options going forward after an LV support device.  We therefore decided on balloon pump after diagnostic angiography.  Upon arrival to the Cath Lab he appeared ill, diaphoretic.  We tried to have him lie down and he would immediately sit up due to being unable to breathe.  We tried higher doses of oxygen through his nasal cannula and even through a facemask but he was adamant about having to sit up.  He was quite uncomfortable even on a wedge at 45 degrees.  I then decided that he needed to be intubated to have this procedure.  Anesthesia was called.  The situation was described to Dr. Glennon Mac and they intubated the patient.  We then prepped and draped him in the sterile fashion.  Right heart catheterization was performed showing moderate pulmonary hypertension and a wedge pressure of 40 mmHg. Diagnostic catheterization was done showing 3 out of 4 grafts being patent.  LV gram showed severe LV dysfunction and LVEDP of 27. Intra-aortic balloon pump was placed successfully.  He likely has a viral myocarditis as a cause of his cardiogenic shock.  It appears the cause of his LV dysfunction is nonischemic.  I discussed the case with Dr. Earlie Server as well.  A dialysis catheter will be placed for CVVHD.  Low-dose IV heparin was started for the balloon  pump.  CRITICAL CARE Performed by: Larae Grooms   Total critical care time: 45 minutes, prior to the cath procedure  Critical care time was exclusive of separately billable procedures and treating other patients.  Critical care was necessary to treat or prevent imminent or life-threatening deterioration.  Critical care was time spent personally by me on the following activities: development of treatment plan with patient and/or surrogate as well as nursing, discussions with consultants, evaluation of patient's response to treatment, examination of patient, obtaining history from patient or surrogate, ordering and performing treatments and interventions, ordering and review of laboratory  studies, ordering and review of radiographic studies, pulse oximetry and re-evaluation of patient's condition.    Larae Grooms

## 2020-09-27 NOTE — Plan of Care (Signed)
  Problem: Clinical Measurements: Goal: Will remain free from infection Outcome: Progressing Goal: Diagnostic test results will improve Outcome: Progressing Goal: Cardiovascular complication will be avoided Outcome: Progressing   Problem: Nutrition: Goal: Adequate nutrition will be maintained Outcome: Progressing   Problem: Pain Managment: Goal: General experience of comfort will improve Outcome: Progressing   Problem: Safety: Goal: Ability to remain free from injury will improve Outcome: Progressing   Problem: Skin Integrity: Goal: Risk for impaired skin integrity will decrease Outcome: Progressing   Problem: Safety: Goal: Non-violent Restraint(s) Outcome: Progressing   Problem: Respiratory: Goal: Ability to maintain a clear airway and adequate ventilation will improve Outcome: Progressing   Problem: Role Relationship: Goal: Method of communication will improve Outcome: Progressing   Problem: Cardiac: Goal: Ability to achieve and maintain adequate cardiopulmonary perfusion will improve Outcome: Progressing

## 2020-09-27 NOTE — Consult Note (Addendum)
Reason for Consult: Volume overload/cardiogenic shock in patient with end-stage renal disease Referring Physician: Ina Homes, MD (CCM)  HPI: 70 year old African-American man with past medical history significant for coronary artery disease status post four-vessel CABG, congestive heart failure with reduced ejection fraction, hypertension, type 2 diabetes mellitus, obstructive sleep apnea, bronchial asthma and end-stage renal disease on hemodialysis on a Monday/Wednesday/Friday schedule.  Presented to [facility] emergency room on 8/15 with complaints of increasing chest pain for 2 days prior to presentation in the emergency room found to be hypotensive with features of cardiogenic shock in the setting of severely decreased ejection fraction on bedside echo.  Transferred to [facility] for invasive cardiac evaluation/management.  Coronary angiography showed patent grafts with depressed EF of 5 to 10% and IABP placed.  With his current hemodynamic state, nephrology consulted for extracorporeal volume unloading with CRRT.  Per his wife and daughter at bedside, no preceding fever or acute illness reported.  No preceding nausea, vomiting or diarrhea.  Recently returned back from a trip to [place].  Hemodialysis prescription: [facility], Monday/Wednesday/Friday, 4 hours, BFR 400, DFR 500, EDW 91.2 kg, 2K/2.5 calcium, left upper arm AV fistula, 15 G needles, heparin 4000 unit bolus, calcitriol 1.75 mcg 3 times weekly  Past Medical History:  Diagnosis Date   Abnormal stress test 02/10/2015   Acute on chronic systolic and diastolic heart failure, NYHA class 1 ([place]) 12/31/2014   Anemia    Anemia in chronic kidney disease 09/03/2012   Arthritis    left  5th finger   Asthma    Asthma, chronic 06/04/2012   Asthma, chronic, mild intermittent, with acute exacerbation 12/22/2015   Bilateral lower extremity edema 12/22/2015   CAD (coronary artery disease) 02/18/2015   Chronic  combined systolic (congestive) and diastolic (congestive) heart failure ([place])    a. 12/31/14: 2D ECHO: EF 40-45%, HK of inf myocardium, G1DD, mod MR   Chronic combined systolic and diastolic CHF (congestive heart failure) ([place]) 08/05/7626   Chronic systolic heart failure ([place]) 02/25/2015   CKD (chronic kidney disease), stage III (HCC)    stage 3 kidney disease   CKD (chronic kidney disease), stage V ([place]) 08/30/2018   COLONIC POLYPS, HX OF 04/13/2009   Qualifier: Diagnosis of  By: Westly Pam.    Coronary artery disease    a. LHC 01/2015 - triple vessel CAD (mod oLM, mLAD, severe mRCA, intermediate branch stenosis, CTO of mCx). Plan CABG 02/2015.   Demand ischemia ([place]) 12/31/2014   DM type 2, uncontrolled, with renal complications ([place]) 04/27/1759   Dyspnea     due to fluid   Elevated troponin    Essential hypertension 07/19/3708   Folliculitis of perineum 10/01/2012   Gastric erosion    GERD (gastroesophageal reflux disease)    History of blood transfusion    Hyperlipidemia    Hyperlipidemia with target LDL less than 100 12/31/2014   Hypertension    Hypertensive heart disease with heart failure (HCC) 02/25/2015   IDA (iron deficiency anemia) 01/23/2016   Melena 01/23/2016   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   Mitral regurgitation    a. Mild-mod by echo 12/2014.   Morbid obesity ([place]) 12/29/2014   Myocardial infarction [facility])    pt. states per [name] he has in the past   NSVT (nonsustained ventricular tachycardia) ([place])    a. 9 beats during 01/2015 adm. BB titrated.   Nuclear sclerotic cataract of left eye 05/26/2019   Nuclear sclerotic  cataract of right eye 05/26/2019   Obesity    a. BMI 33   Obesity (BMI 30-39.9) 05/05/2013   OSA (obstructive sleep apnea) 01/16/2018   Mild obstructive sleep apnea overall with an AHI of 7.3/h and no significant central sleep apnea. Severe obstructive sleep apnea during REM sleep with an AHI of 34.3/h.  Now on  CPAP at 6 cm H2O.    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   Type II diabetes mellitus ([place])    Vitreous hemorrhage of right eye ([place]) 05/26/2019   Wears dentures     Past Surgical History:  Procedure Laterality Date   BACK SURGERY     BASCILIC VEIN TRANSPOSITION Left 11/14/2018   Procedure: FIRST STAGE BASILIC VEIN TRANSPOSITION LEFT ARM;  Surgeon: Serafina Mitchell, MD;  Location: [facility];  Service: Vascular;  Laterality: Left;   [place] Left 01/28/2019   Procedure: BASILIC VEIN TRANSPOSITION SECOND STAGE;  Surgeon: Serafina Mitchell, MD;  Location: [facility];  Service: Vascular;  Laterality: Left;   BONE MARROW BIOPSY     CARDIAC CATHETERIZATION N/A 02/09/2015   Procedure: Left Heart Cath and Coronary Angiography;  Surgeon: Burnell Blanks, MD;  Location: [facility];  Service: Cardiovascular;  Laterality: N/A;   CATARACT EXTRACTION Bilateral 06/11/2020   COLONOSCOPY  2011   Dr. [name]: Ileocecal valve appeared normal, scattered pancolonic diverticulosis, difficult bowel prep making smaller lesions potentially missed. Recommended three-year follow-up colonoscopy.   COLONOSCOPY  2003   Dr. [name]: Suspicious lesion at the ileocecal valve, multiple biopsies benign, pancolonic diverticulosis.   COLONOSCOPY N/A 02/15/2016   diverticulosis in sigmoid and descending colon, single 5 mm polyp at splenic flexure (Tubular adenoma)   CORONARY ARTERY BYPASS GRAFT N/A 02/18/2015   Procedure: CORONARY ARTERY BYPASS GRAFTING (CABG) x  four, using bilateral internal mammary arteries and right leg greater saphenous vein harvested endoscopically;  Surgeon: [name], MD;  Location: [facility];  Service: Open Heart Surgery;  Laterality:  N/A;   CORONARY/GRAFT ACUTE MI REVASCULARIZATION N/A 09/19/2020   Procedure: Coronary/Graft Acute MI Revascularization;  Surgeon: Jettie Booze, MD;  Location: [facility];  Service: Cardiovascular;  Laterality: N/A;   ESOPHAGOGASTRODUODENOSCOPY N/A 02/15/2016   normal   EYE SURGERY Right    July 2019    EYE SURGERY Left 06/2020   GIVENS CAPSULE STUDY N/A 01/08/2017   couple of gastric and small bowel eroions in setting of aspirin 81 mg daily but nothing concerning, continue Hematology follow-up   HERNIA REPAIR  2878   Umbilical   IABP INSERTION N/A 09/13/2020   Procedure: IABP Insertion;  Surgeon: Jettie Booze, MD;  Location: [facility];  Service: Cardiovascular;  Laterality: N/A;   LEFT HEART CATH AND CORONARY ANGIOGRAPHY N/A 09/13/2020   Procedure: LEFT HEART CATH AND CORONARY ANGIOGRAPHY;  Surgeon: Jettie Booze, MD;  Location: [facility];  Service: Cardiovascular;  Laterality: N/A;   LUMBAR DISC SURGERY  2006   L4 & L5   REFRACTIVE SURGERY Left    2019    REFRACTIVE SURGERY Right    2019   RIGHT HEART CATH N/A 10/01/2020   Procedure: RIGHT HEART CATH;  Surgeon: Jettie Booze, MD;  Location: [place] [facility];  Service: Cardiovascular;  Laterality: N/A;   TEE WITHOUT CARDIOVERSION N/A 02/18/2015   Procedure: TRANSESOPHAGEAL ECHOCARDIOGRAM (TEE);  Surgeon: [name], MD;  Location: [facility];  Service: Open Heart Surgery;  Laterality: N/A;   TONSILLECTOMY  1962    Family History  Problem Relation Age of Onset   Diabetes Mother    Heart disease Mother        later in life, age >28   Heart disease Father        heart failure later in life   Hypertension Father    Stroke Sister    Kidney disease Daughter    Sudden death Daughter    Colon cancer Paternal Uncle        Passed from colon CA   Heart attack Neg Hx     Social History:  reports that he quit smoking about 5 years ago. His smoking use included cigars. He has never  used smokeless tobacco. He reports that he does not currently use alcohol after a past usage of about 3.0 standard drinks per week. He reports that he does not use drugs.  Allergies: Not on File  Medications: I have reviewed the patient's current medications. Scheduled:  aspirin  81 mg Per Tube Daily   chlorhexidine gluconate (MEDLINE KIT)  15 mL Mouth Rinse BID   Chlorhexidine Gluconate Cloth  6 each Topical Daily   docusate  100 mg Per Tube BID   insulin aspart  0-6 Units Subcutaneous Q4H   mouth rinse  15 mL Mouth Rinse 10 times per day   [START ON 09/28/2020] pantoprazole sodium  40 mg Per Tube Daily   polyethylene glycol  17 g Per Tube Daily   sodium chloride flush  10-40 mL Intracatheter Q12H   sodium chloride flush  3 mL Intravenous Q12H    BMP Latest Ref Rng & Units 09/29/2020 09/29/2020 09/25/2020  Glucose 70 - 99 mg/dL - 62(L) -  BUN 8 - 23 mg/dL - 48(H) -  Creatinine 0.61 - 1.24 mg/dL - 7.38(H) -  BUN/Creat Ratio 10 - 24 - - -  Sodium 135 - 145 mmol/L 133(L) 137 133(L)  Potassium 3.5 - 5.1 mmol/L 4.5 5.3(H) 5.7(H)  Chloride 98 - 111 mmol/L - 91(L) -  CO2 22 - 32 mmol/L - 14(L) -  Calcium 8.9 - 10.3 mg/dL - 8.7(L) -   CBC Latest Ref Rng & Units 09/29/2020 09/26/2020 09/22/2020  WBC 4.0 - 10.5 K/uL - 8.6 -  Hemoglobin 13.0 - 17.0 g/dL 12.6(L) 10.9(L) 14.6  Hematocrit 39.0 - 52.0 % 37.0(L) 35.2(L) 43.0  Platelets 150 - 400 K/uL - 184 -     CARDIAC CATHETERIZATION  Result Date: 10/05/2020   Ost LM lesion is 50% stenosed.   Prox LAD lesion is 80% stenosed.  LIMA to LAD is patent.   Dist LAD lesion is 60% stenosed.   Ramus lesion is 90% stenosed.  Radial graft to ramus widely patent.   Prox Cx to Mid Cx lesion is 100% stenosed.  SVG to OM occluded.   Prox RCA lesion is 100% stenosed.  SVG to PDA is widely patent.   Ost RPDA lesion is 40% stenosed.   2nd Diag lesion is 40% stenosed.   There is severe left ventricular systolic dysfunction.   LV end diastolic pressure is moderately  elevated.   The left ventricular ejection fraction is less than 25% by visual estimate.   There is no aortic valve stenosis.  Successful intra-aortic balloon pump placement.   Hemodynamic findings consistent with moderate pulmonary hypertension.   Ao sat 100%, PA sat 65%, mean PA pressure 41 mmHg, mean wedge pressure 41 mmHg, cardiac output 4.57 L/min; cardiac index 2.1. Three of four grafts are patent.  LV dysfunction out of proportion to degree of CAD.  Nonischemic cause of acute systolic dysfunction.  Most likely, he has a viral myocarditis.  Hopefully, he will recover with hemodynamic support of IABP.  Case discussed with CCM as well.  They will place a temporary dialysis catheter for CVVHD. Heart failure service to consult as well.   DG CHEST PORT 1 VIEW  Result Date: 09/21/2020 CLINICAL DATA:  Central line placement EXAM: PORTABLE CHEST 1 VIEW COMPARISON:  Earlier same day FINDINGS: New left IJ central line tip overlies SVC. No pneumothorax. Endotracheal tube, partially imaged enteric tube, and femoral approach Swan-Ganz catheter are unchanged. Low lung volumes. No new consolidation or edema. No pleural effusion. Stable cardiomediastinal contours. IMPRESSION: New left IJ central line tip overlies SVC. No pneumothorax. Otherwise stable lines and tubes. Electronically Signed   By: Macy Mis M.D.   On: 09/26/2020 09:40   DG CHEST PORT 1 VIEW  Result Date: 09/20/2020 CLINICAL DATA:  Tube placement. EXAM: PORTABLE CHEST 1 VIEW COMPARISON:  September 26, 2020. FINDINGS: Endotracheal tube approximately 5 cm from the carina just below the clavicular heads. Gastric tube courses through and off the field of the radiograph, tip perhaps in proximal stomach. Incompletely visualized. Swan-Ganz catheter enters via inferior approach terminating in the area of the RIGHT hilum likely in RIGHT main pulmonary artery. Marker for intra-aortic balloon pump projecting at the level of the aortic arch. Metallic marker  approximately 2.5-3 cm above the carina. EKG leads are in place. Signs of median sternotomy and CABG. Heart size is stable with mild to moderate cardiac enlargement. Hilar structures with pulmonary vascular congestion. No frank edema. No signs of airspace consolidation. No visible pneumothorax. On limited assessment no acute skeletal process. IMPRESSION: 1. No signs of edema or consolidation. 2. ET tube and Swan-Ganz catheter position as described. 3. Borderline high position of the IABP marker at the level of the aortic arch approximately 2.5-3 cm above the carina. 4. Gastric tube courses through and off the field of the radiograph, tip likely in proximal stomach incompletely assessed. Electronically Signed   By: Zetta Bills M.D.   On: 10/12/2020 08:14   DG Chest Portable 1 View  Result Date: 09/29/2020 CLINICAL DATA:  Cough EXAM: PORTABLE CHEST 1 VIEW COMPARISON:  06/03/2020 FINDINGS: Post sternotomy changes. Cardiomegaly. Aortic atherosclerosis. Possible tiny left effusion. No pneumothorax IMPRESSION: Cardiomegaly with possible trace left effusion Electronically Signed   By: Donavan Foil M.D.   On: 09/21/2020 20:52    Review of Systems  Unable to perform ROS: Intubated  Blood pressure (!) 142/101, pulse (!) 113, temperature 97.9 F (36.6 C), resp. rate 17, height $RemoveBe'6\' 2"'tuNxJQymJ$  (1.88 m), weight 90.7 kg, SpO2 100 %. Physical Exam Vitals and nursing note reviewed.  Constitutional:      Appearance: He is obese. He is ill-appearing.  HENT:     Head: Normocephalic and atraumatic.     Right Ear: External ear normal.     Left Ear: External ear normal.     Nose: Nose normal.     Mouth/Throat:     Comments: Intubated Eyes:     General: No scleral icterus.    Conjunctiva/sclera: Conjunctivae normal.     Pupils: Pupils are  equal, round, and reactive to light.  Cardiovascular:     Rate and Rhythm: Regular rhythm. Tachycardia present.     Pulses: Normal pulses.     Heart sounds: No murmur  heard. Pulmonary:     Breath sounds: Normal breath sounds. No wheezing or rales.     Comments: On ventilator-intubated Abdominal:     General: Abdomen is flat.     Palpations: Abdomen is soft.     Tenderness: no abdominal tenderness There is no guarding.  Musculoskeletal:     Cervical back: Normal range of motion and neck supple.     Right lower leg: Edema present.     Left lower leg: Edema present.     Comments: Trace bilateral lower extremity edema, left upper arm AV fistula  Skin:    General: Skin is warm and dry.    Assessment/Plan: 1.  Cardiogenic shock: Acute exacerbation of congestive heart failure with reduced ejection fraction and suspected to be viral myocarditis.  With intra-aortic balloon pump in situ and will begin CRRT for extracorporeal volume unloading while following CVP/volume assessment. 2.  End-stage renal disease: Usually on Monday/Wednesday/Friday schedule and underwent hemodialysis yesterday however, elevated central pressures on right heart catheterization prompting need for extracorporeal volume unloading in the setting of cardiogenic shock. 3.  Hyperkalemia: Mild, monitor with CRRT. 4.  Anion gap metabolic acidosis/lactic acidosis: Anticipate to correct with CRRT 5.  Anemia of chronic kidney disease: Hemoglobin and hematocrit currently acceptable, will continue to follow trend to decide on need for supplementation. 6.  Chronic kidney disease-metabolic bone disease: Currently n.p.o. while intubated, monitor off binders anticipating electrolyte depletion with CRRT.  Eltha Tingley K. 10/05/2020, 10:48 AM

## 2020-09-27 NOTE — Progress Notes (Signed)
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 He presented to [facility] with chest pain. Trop 2,248. Transferred to [facility] for [place] found to have 3 out of 4 patent grafts but EF down to 5%. IABP placed 1:1 support at this time, not requiring pressors nor inotropes.  eICU Interventions  Working diagnosis cardiogenic shock secondary to myocarditis on IABP support. Bedside CCM team held off on HD cath insertion as BP stable and may be able to tolerate regular HD     Intervention Category Major Interventions: Shock - evaluation and management;Respiratory failure - evaluation and management Evaluation Type: New Patient Evaluation  Judd Lien 09/29/2020, 3:58 AM

## 2020-09-27 NOTE — Progress Notes (Signed)
RT note. Patient transported from cath lab to [place] Rm 20 on vent without any complications noted.  RN notified to order chest xr for ETT placement. RT will continue to monitor.

## 2020-09-27 NOTE — Progress Notes (Signed)
ANTICOAGULATION CONSULT NOTE - Initial Consult  Pharmacy Consult for Heparin  Indication:  IABP  Not on File  Patient Measurements: Height: 6\' 2"  (188 cm) Weight: 91 kg (200 lb 9.9 oz) IBW/kg (Calculated) : 82.2  Vital Signs: Temp: 95.9 F (35.5 C) (08/16 0500) Temp Source: Oral (08/15 1917) BP: 143/104 (08/16 0500) Pulse Rate: 0 (08/16 0334)  Labs: Recent Labs    10/03/2020 2024 09/22/2020 2247 09/12/2020 0201 10/04/2020 0325 09/29/2020 0451  HGB 12.7*   < > 14.6 10.9* 12.6*  HCT 38.9*   < > 43.0 35.2* 37.0*  PLT 239  --   --  184  --   LABPROT 19.9*  --   --   --   --   INR 1.7*  --   --   --   --   CREATININE 6.80*  --   --  7.38*  --   TROPONINIHS 6,552*  --   --  30,076*  --    < > = values in this interval not displayed.    Estimated Creatinine Clearance: 10.5 mL/min (A) (by C-G formula based on SCr of 7.38 mg/dL (H)).   Medical History: Past Medical History:  Diagnosis Date   Abnormal stress test 02/10/2015   Acute on chronic systolic and diastolic heart failure, NYHA class 1 ([place]) 12/31/2014   Anemia    Anemia in chronic kidney disease 09/03/2012   Arthritis    left  5th finger   Asthma    Asthma, chronic 06/04/2012   Asthma, chronic, mild intermittent, with acute exacerbation 12/22/2015   Bilateral lower extremity edema 12/22/2015   CAD (coronary artery disease) 02/18/2015   Chronic combined systolic (congestive) and diastolic (congestive) heart failure ([place])    a. 12/31/14: 2D ECHO: EF 40-45%, HK of inf myocardium, G1DD, mod MR   Chronic combined systolic and diastolic CHF (congestive heart failure) ([place]) 22/63/3354   Chronic systolic heart failure ([place]) 02/25/2015   CKD (chronic kidney disease), stage III (HCC)    stage 3 kidney disease   CKD (chronic kidney disease), stage V ([place]) 08/30/2018   COLONIC POLYPS, HX OF 04/13/2009   Qualifier: Diagnosis of  By: Westly Pam.    Coronary artery disease    a. LHC 01/2015 - triple vessel CAD (mod oLM, mLAD,  severe mRCA, intermediate branch stenosis, CTO of mCx). Plan CABG 02/2015.   Demand ischemia ([place]) 12/31/2014   DM type 2, uncontrolled, with renal complications ([place]) 5/62/5638   Dyspnea     due to fluid   Elevated troponin    Essential hypertension 9/37/3428   Folliculitis of perineum 10/01/2012   Gastric erosion    GERD (gastroesophageal reflux disease)    History of blood transfusion    Hyperlipidemia    Hyperlipidemia with target LDL less than 100 12/31/2014   Hypertension    Hypertensive heart disease with heart failure (HCC) 02/25/2015   IDA (iron deficiency anemia) 01/23/2016   Melena 01/23/2016   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   Mitral regurgitation    a. Mild-mod by echo 12/2014.   Morbid obesity ([place]) 12/29/2014   Myocardial infarction [facility])    pt. states per [name] he has in the past   NSVT (nonsustained ventricular tachycardia) ([place])    a. 9 beats during 01/2015 adm. BB titrated.   Nuclear sclerotic cataract of left eye 05/26/2019   Nuclear sclerotic cataract of right eye 05/26/2019   Obesity    a. BMI 33  Obesity (BMI 30-39.9) 05/05/2013   OSA (obstructive sleep apnea) 01/16/2018   Mild obstructive sleep apnea overall with an AHI of 7.3/h and no significant central sleep apnea. Severe obstructive sleep apnea during REM sleep with an AHI of 34.3/h.  Now on CPAP at 6 cm H2O.    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 Pt was taken to cath where IABP was placed. Pt  has ESRD on HD.  Goal of Therapy:  Heparin level 0.2-0.5 units/ml Monitor platelets by anticoagulation protocol: Yes   Plan:  Start heparin drip at 1000 units/hr 1200 heparin level Daily CBC/heparin level Monitor for bleeding   Narda Bonds, PharmD, BCPS Clinical Pharmacist Phone: [phone]

## 2020-09-27 NOTE — Progress Notes (Signed)
NAME:  Henry Carroll, MRN:  [id number], DOB:  02/25/1950, LOS: 0 ADMISSION DATE:  10/03/2020, CONSULTATION DATE:  8/16 REFERRING MD:  Dr. Haroldine Laws , CHIEF COMPLAINT:  cardiogenic shock   History of Present Illness:  Patient is encephalopathic and/or intubated. Therefore history has been obtained from chart review.  70 year old male with past medical history as below, which is significant for end-stage renal disease on dialysis with most recent dialysis 8/15, coronary artery disease status post four-vessel CABG, heart failure with reduced ejection fraction, asthma, diabetes mellitus, and obstructive sleep apnea. He presented to [facility] ED 8/15 with complaints of chest pain x 2 days. In the ED he was noted to be hypotensive with cool extremities concerning for cardiogenic shock. Bedside echo showed severely decreased ejection fraction. Troponin was elevated. He was transferred to [facility] for possible invasive cardiac evaluation.   Upon arrival to [facility] he was taken immediately to the cath lab for NSTEMI. He was not able to tolerate the supine position and required intubation. Grafts were felt to be patent, but EF noted to be around 5-10%. Balloon pump placed. He did not require pressors during the case. Post procedurally he was taken to the ICU for ongoing care.   Pertinent  Medical History   has a past medical history of Abnormal stress test (02/10/2015), Acute on chronic systolic and diastolic heart failure, NYHA class 1 ([place]) (12/31/2014), Anemia, Anemia in chronic kidney disease (09/03/2012), Arthritis, Asthma, Asthma, chronic (06/04/2012), Asthma, chronic, mild intermittent, with acute exacerbation (12/22/2015), Bilateral lower extremity edema (12/22/2015), CAD (coronary artery disease) (02/18/2015), Chronic combined systolic (congestive) and diastolic (congestive) heart failure ([place]), Chronic combined systolic and diastolic CHF (congestive heart failure) ([place]) (02/10/2015),  Chronic systolic heart failure ([place]) (02/25/2015), CKD (chronic kidney disease), stage III ([place]), CKD (chronic kidney disease), stage V ([place]) (08/30/2018), COLONIC POLYPS, HX OF (04/13/2009), Coronary artery disease, Demand ischemia ([place]) (12/31/2014), DM type 2, uncontrolled, with renal complications ([place]) (7/35/3299), Dyspnea, Elevated troponin, Essential hypertension (2/42/6834), Folliculitis of perineum (10/01/2012), Gastric erosion, GERD (gastroesophageal reflux disease), History of blood transfusion, Hyperlipidemia, Hyperlipidemia with target LDL less than 100 (12/31/2014), Hypertension, Hypertensive heart disease with heart failure ([place]) (02/25/2015), IDA (iron deficiency anemia) (01/23/2016), Melena (01/23/2016), 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), Mitral regurgitation, Morbid obesity ([place]) (12/29/2014), Myocardial infarction [facility]), NSVT (nonsustained ventricular tachycardia) ([place]), Nuclear sclerotic cataract of left eye (05/26/2019), Nuclear sclerotic cataract of right eye (05/26/2019), Obesity, Obesity (BMI 30-39.9) (05/05/2013), OSA (obstructive sleep apnea) (01/16/2018), Other malaise and fatigue (09/03/2012), 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  Significant Hospital Events: Including procedures, antibiotic start and stop dates in addition to other pertinent events   8/16 admit to [facility].  Intubated.  Right femoral PAC placed. IV heparin started.IABP placed. Inotrope support. HD cath placed for CRRT  Interim History / Subjective:  Sedated on vent   Objective   Blood pressure 94/67, pulse (Abnormal) 110, temperature 98.1 F (36.7 C), resp. rate 18, height 6\' 2"  (1.88 m), weight 90.7 kg, SpO2 100  %. PAP: (33-46)/(26-33) 33/26 CVP:  [10 mmHg-13 mmHg] 10 mmHg CO:  [3.5 L/min-3.7 L/min] 3.5 L/min CI:  [1.6 L/min/m2-1.7 L/min/m2] 1.6 L/min/m2  Vent Mode: PRVC FiO2 (%):  [40 %-100 %] 40 % Set Rate:  [18 bmp] 18 bmp Vt Set:  [650 mL] 650  mL PEEP:  [5 cmH20] 5 cmH20 Plateau Pressure:  [17 cmH20-23 cmH20] 23 cmH20   Intake/Output Summary (Last 24 hours) at 10/05/2020 0827 Last data filed at 09/19/2020 0700 Gross per 24 hour  Intake 630.57 ml  Output 1100 ml  Net -469.43 ml   Filed Weights   10/04/2020 1914 10/10/2020 0400  Weight: 91 kg 90.7 kg    General: This is a 70 year old male patient he is currently sedated on a fentanyl drip, with plans to initiate Precedex HEENT normocephalic atraumatic orally intubated left IJ triple-lumen catheter is unremarkable Pulmonary: Clear to auscultation bilaterally diminished in the bases no accessory use Cardiac: Distant heart tones.  Regular rhythm. Abdomen: Soft nontender hypoactive bowel sounds GU: An uric Extremities: Warm and dry, left upper extremity AV fistula with good bruit and thrill Neuro: [facility] Problem list     Assessment & Plan:   Acute on chronic HFrEF with Cardiogenic shock: has been worked up by cardiology. It is not felt to be ischemic in nature. Viral myocarditis? EF at 5-10% by bedside echo. Balloon pump placed in cath lab.  -Co-Oximetry still low Plan IABP per advanced HF team Place HD cant to facilitate volume optimization via CRRT Minimize sedation Telemetry IV heparin for IABP Change aspirin to via tube  Acute hypoxemic respiratory failure OSA Endotracheal tube in sinus position, PICC line in satisfactory position.  Progressive bilateral patchy airspace disease left greater than right.  Cannot exclude an element of edema.  Endotracheal tube 3 position.  Cardiomegaly vascular fullness but no pulmonary edema Plan Continue full ventilator support PAD protocol 0 to -1 Volume removal VAP  bundle Once hemodynamics optimized and volume status improved can assess for weaning Will decrease minute ventilation, he is at great risk for iatrogenic alkalosis as his acid-base improves metabolically  NSTEMI:  likely demand ischemia for poor global perfusion Plan As directed by cardiology  ESRD on HD (MWF) Hyponatremia: hypervolemic AGMA/lactic acidosis: lactic acid 11 on admission.  Remains elevated, LFTs are elevated which is likely due to hepatic congestion.  I wonder if this is resulting in lactate clearing slowly -Nephrology consulted Plan Initiating CRRT  Abnormal LFTs.  Suspect shock liver versus hepatic congestion Plan Trend LFTs  DM Plan Sliding scale insulin goal glucose 140-180  Best Practice (right click and "Reselect all SmartList Selections" daily)   Diet/type: NPO DVT prophylaxis: prophylactic heparin  GI prophylaxis: PPI Lines: Central line Foley:  N/A Code Status:  full code Last date of multidisciplinary goals of care discussion []   My cct 32 min  Erick Colace ACNP-BC [place] Pager # [phone] OR # [phone] if no answer

## 2020-09-27 NOTE — Progress Notes (Signed)
Initial Nutrition Assessment  DOCUMENTATION CODES:   Non-severe (moderate) malnutrition in context of chronic illness  INTERVENTION:   Tube feeding:  -Vital 1.5 @ 20 ml/hr via OG -Increase per tolerance to goal rate of 55 ml/hr (1320 ml) -ProSource TF 45 ml QID -Add b complex with Vitamin C to account for losses with CRRT  At goal rate TF provides: 2140 kcals, 133 grams protein, 1008 ml free water.   Consider Cortrak placement if long term intubation is anticipated   NUTRITION DIAGNOSIS:   Moderate Malnutrition related to chronic illness as evidenced by mild fat depletion, mild muscle depletion, energy intake < 75% for > or equal to 1 month.  GOAL:   Patient will meet greater than or equal to 90% of their needs  MONITOR:   Vent status, Skin, TF tolerance, Weight trends, Labs, I & O's  REASON FOR ASSESSMENT:   Ventilator    ASSESSMENT:   Patient with PMH significant for CHF, ESRD on HD, asthma, CAD s/p CABG, GERD, HLD, HTN, MR, MI, PAD, and DM. Presents this admission with CHF exacerbation/cardiogenic shock.  8/16- heart cath, intubated, IABP placed, HD cath placed   Requiring pressors. MAPs between 68-86. Started on CRRT for volume removal. OG located in stomach per xray. Okay to start feeding per CCM.   Discussed history with family at bedside. Patient started intermittent fasting over the last few months in attempts to lose weight for transplant list. He Fasted for 16 hours and consumed two meals in the 8 hours he allowed for eating. Meals consisted of eggs, bacon, toast for breakfast and a salad for dinner (some with protein, some without). Weight was 210 lb prior to starting this and patient eventually got down to below 200 lb.  Patient presents with muscle and fat depletion concerning for malnutrition. He will likely need extensive diet education once extubated as current intake is not adequate for muscle retention.   EDW: 91.2 kg  Current weight: 90.7 kg   OG:  1100 ml x 24 hrs  UOP: none  Drips: precedex, doutamine, levophed Medications: colace, SS novolog, miralax Labs: Na 133 (L) Phosphorus 11.6 (H) LFTs elevated CBG 53-236  NUTRITION - FOCUSED PHYSICAL EXAM:  Flowsheet Row Most Recent Value  Orbital Region Mild depletion  Upper Arm Region Mild depletion  Thoracic and Lumbar Region Unable to assess  Buccal Region No depletion  Temple Region Moderate depletion  Clavicle Bone Region Mild depletion  Clavicle and Acromion Bone Region Mild depletion  Scapular Bone Region Unable to assess  Dorsal Hand No depletion  Patellar Region Mild depletion  Anterior Thigh Region Mild depletion  Posterior Calf Region Mild depletion  Edema (RD Assessment) Mild  Hair Reviewed  Eyes Unable to assess  Mouth Unable to assess  Skin Reviewed  Nails Reviewed      Diet Order:   Diet Order             Diet NPO time specified  Diet effective now                   EDUCATION NEEDS:   Not appropriate for education at this time  Skin:  Skin Assessment: Reviewed RN Assessment  Last BM:  PTA (yesterday per family)  Height:   Ht Readings from Last 1 Encounters:  10/01/2020 6\' 2"  (1.88 m)    Weight:   Wt Readings from Last 1 Encounters:  09/17/2020 90.7 kg    BMI:  Body mass index is 25.67 kg/m.  Estimated Nutritional Needs:   Kcal:  2100-2300 kcal  Protein:  130-155 grams  Fluid:  >/= 2 L/day  Mariana Single MS, RD, LDN, CNSC Clinical Nutrition Pager listed in [place]

## 2020-09-27 NOTE — Progress Notes (Signed)
ANTICOAGULATION CONSULT NOTE - Initial Consult  Pharmacy Consult for Heparin  Indication:  IABP  Not on File  Patient Measurements: Height: 6\' 2"  (188 cm) Weight: 90.7 kg (199 lb 15.3 oz) IBW/kg (Calculated) : 82.2  Vital Signs: Temp: 95.9 F (35.5 C) (08/16 1330) Temp Source: Core (08/16 1200) BP: 137/89 (08/16 1330) Pulse Rate: 114 (08/16 1330)  Labs: Recent Labs    09/19/2020 2024 09/23/2020 2129 09/25/2020 2247 09/19/2020 0201 09/20/2020 0325 09/26/2020 0451 09/13/2020 0542 10/08/2020 1226  HGB 12.7*  --    < > 14.6 10.9* 12.6*  --   --   HCT 38.9*  --    < > 43.0 35.2* 37.0*  --   --   PLT 239  --   --   --  184  --   --   --   LABPROT 19.9*  --   --   --   --   --   --   --   INR 1.7*  --   --   --   --   --   --   --   HEPARINUNFRC  --   --   --   --   --   --   --  0.15*  CREATININE 6.80*  --   --   --  7.38*  --   --   --   TROPONINIHS 6,552* 7,265*  --   --  77,824*  --  18,109*  --    < > = values in this interval not displayed.     Estimated Creatinine Clearance: 10.5 mL/min (A) (by C-G formula based on SCr of 7.38 mg/dL (H)).   Medical History: Past Medical History:  Diagnosis Date   Abnormal stress test 02/10/2015   Acute on chronic systolic and diastolic heart failure, NYHA class 1 ([place]) 12/31/2014   Anemia    Anemia in chronic kidney disease 09/03/2012   Arthritis    left  5th finger   Asthma    Asthma, chronic 06/04/2012   Asthma, chronic, mild intermittent, with acute exacerbation 12/22/2015   Bilateral lower extremity edema 12/22/2015   CAD (coronary artery disease) 02/18/2015   Chronic combined systolic (congestive) and diastolic (congestive) heart failure ([place])    a. 12/31/14: 2D ECHO: EF 40-45%, HK of inf myocardium, G1DD, mod MR   Chronic combined systolic and diastolic CHF (congestive heart failure) ([place]) 23/53/6144   Chronic systolic heart failure ([place]) 02/25/2015   CKD (chronic kidney disease), stage III (HCC)    stage 3 kidney disease   CKD  (chronic kidney disease), stage V ([place]) 08/30/2018   COLONIC POLYPS, HX OF 04/13/2009   Qualifier: Diagnosis of  By: Westly Pam.    Coronary artery disease    a. LHC 01/2015 - triple vessel CAD (mod oLM, mLAD, severe mRCA, intermediate branch stenosis, CTO of mCx). Plan CABG 02/2015.   Demand ischemia ([place]) 12/31/2014   DM type 2, uncontrolled, with renal complications ([place]) 04/27/4006   Dyspnea     due to fluid   Elevated troponin    Essential hypertension 6/76/1950   Folliculitis of perineum 10/01/2012   Gastric erosion    GERD (gastroesophageal reflux disease)    History of blood transfusion    Hyperlipidemia    Hyperlipidemia with target LDL less than 100 12/31/2014   Hypertension    Hypertensive heart disease with heart failure ([place]) 02/25/2015   IDA (iron deficiency anemia) 01/23/2016   Melena 01/23/2016  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   Mitral regurgitation    a. Mild-mod by echo 12/2014.   Morbid obesity ([place]) 12/29/2014   Myocardial infarction [facility])    pt. states per [name] he has in the past   NSVT (nonsustained ventricular tachycardia) ([place])    a. 9 beats during 01/2015 adm. BB titrated.   Nuclear sclerotic cataract of left eye 05/26/2019   Nuclear sclerotic cataract of right eye 05/26/2019   Obesity    a. BMI 33   Obesity (BMI 30-39.9) 05/05/2013   OSA (obstructive sleep apnea) 01/16/2018   Mild obstructive sleep apnea overall with an AHI of 7.3/h and no significant central sleep apnea. Severe obstructive sleep apnea during REM sleep with an AHI of 34.3/h.  Now on CPAP at 6 cm H2O.    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   PAD (peripheral artery disease) ([place]) 03/02/2015   Pneumonia    Sleep apnea    2019, Dr.Turner diagnoised    Small bowel lesion    Symptomatic anemia 08/29/2018   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   Type II diabetes mellitus ([place])    Vitreous hemorrhage of  right eye ([place]) 05/26/2019   Wears dentures     Assessment: 70 y/o M transfer from [facility] for higher level of care, history of CHF and CABG, ECHO revealed EF 20-25%. Pt was taken to cath where IABP was placed. Pt has ESRD on HD.  HL subtherapeutic at 0.15. No bleeding or issues with line per RN. Patient started CRRT 2 hours ago, with fixed dose heparin in the circuit. As such will not adjust heparin at this time.    Goal of Therapy:  Heparin level 0.2-0.5 units/ml Monitor platelets by anticoagulation protocol: Yes   Plan:  Continue heparin drip at 1000 units/hr 2000 heparin level Daily CBC/heparin level Monitor for bleeding   Cathrine Muster, PharmD PGY2 Cardiology Pharmacy Resident Phone: [phone] 10/12/2020  2:01 PM Please check [email or web address] for unit-specific pharmacy phone numbers.

## 2020-09-27 NOTE — TOC Initial Note (Addendum)
Transition of Care [facility]) - Initial/Assessment Note    Patient Details  Name: Henry Carroll MRN: [id number] Date of Birth: 1950-02-20  [facility] CM/SW Contact:    Erenest Rasher, RN Phone Number: [phone] 09/26/2020, 1:21 PM  Clinical Narrative:                 HF TOC CM spoke to wife and daughter at bedside. States pt was independent prior to hospitalization. No DME at this time. HF TOC CM/CSW will follow for dc needs. Wife states she does not believe he would be agreeable to SNF but may agree to [facility]. Will have PT/OT follow up when medically stable.   Expected Discharge Plan: [place] Barriers to Discharge: Continued Medical Work up   Patient Goals and CMS Choice Patient states their goals for this hospitalization and ongoing recovery are:: wants pt to remain independent CMS Medicare.gov Compare Post Acute Care list provided to:: Patient Represenative (must comment) (Spouse) Choice offered to / list presented to : Spouse  Expected Discharge Plan and Services Expected Discharge Plan: [place] In-house Referral: Clinical Social Work Discharge Planning Services: CM Consult Post Acute Care Choice: [place] Colon arrangements for the past 2 months: [place]   Prior Living Arrangements/Services Living arrangements for the past 2 months: Single Family Home Lives with:: Spouse Patient language and need for interpreter reviewed:: Yes        Need for Family Participation in Patient Care: Yes (Comment) Care giver support system in place?: Yes (comment)   Criminal Activity/Legal Involvement Pertinent to Current Situation/Hospitalization: No - Comment as needed  Activities of Daily Living Home Assistive Devices/Equipment: Shower chair with back ADL Screening (condition at time of admission) Patient's cognitive ability adequate to safely complete daily activities?: Yes Is the patient deaf or have  difficulty hearing?: No Does the patient have difficulty seeing, even when wearing glasses/contacts?: No Does the patient have difficulty concentrating, remembering, or making decisions?: No Patient able to express need for assistance with ADLs?: Yes Does the patient have difficulty dressing or bathing?: No Independently performs ADLs?: Yes (appropriate for developmental age) Does the patient have difficulty walking or climbing stairs?: No Weakness of Legs: None Weakness of Arms/Hands: None  Permission Sought/Granted Permission sought to share information with : Case Manager, PCP, Family Supports Permission granted to share information with : Yes, Verbal Permission Granted  Share Information with NAME: Faraaz Wolin  Permission granted to share info w AGENCY: Home health  Permission granted to share info w Relationship: wife  Permission granted to share info w Contact Information: [phone]  Emotional Assessment           Psych Involvement: No (comment)  Admission diagnosis:  Cardiogenic shock ([place]) [R57.0] Acute viral myocarditis [I40.0] Patient Active Problem List   Diagnosis Date Noted   Cardiogenic shock ([place]) 09/25/2020   Acute viral myocarditis 09/26/2020   ESRD (end stage renal disease) on dialysis [facility])    Acute hypoxemic respiratory failure ([place])    Pseudophakia of both eyes 07/04/2020   Vitreous hemorrhage of left eye ([place]) 04/07/2020   Stable treated proliferative diabetic retinopathy of right eye determined by examination associated with type 2 diabetes mellitus ([place]) 05/26/2019   Proliferative diabetic retinopathy of left eye with macular edema associated with type 2 diabetes mellitus ([place]) 05/26/2019   CKD (chronic kidney disease), stage V ([place]) 08/30/2018   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  Symptomatic anemia 08/29/2018   OSA (obstructive sleep apnea) 01/16/2018   Small bowel lesion    Gastric erosion    Iron deficiency  anemia 12/10/2016   IDA (iron deficiency anemia) 01/23/2016   Melena 01/23/2016   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   Bilateral lower extremity edema 12/22/2015   Asthma, chronic, mild intermittent, with acute exacerbation 12/22/2015   PAD (peripheral artery disease) ([place]) 16/11/9602   Chronic systolic heart failure ([place]) 02/25/2015   Hypertensive heart disease with heart failure ([place]) 02/25/2015   CAD (coronary artery disease) 02/18/2015   CAD in native artery 02/10/2015   Abnormal stress test 02/10/2015   Mitral regurgitation 02/10/2015   Chronic combined systolic and diastolic CHF (congestive heart failure) ([place]) 02/10/2015   Acute on chronic systolic and diastolic heart failure, NYHA class 1 ([place]) 12/31/2014   Hyperlipidemia with target LDL less than 100 12/31/2014   Demand ischemia ([place]) 12/31/2014   Elevated troponin    Essential hypertension 10/09/2013   DM type 2, uncontrolled, with renal complications ([place]) 54/10/8117   Obesity (BMI 30-39.9) 14/78/2956   Folliculitis of perineum 10/01/2012   Anemia in chronic kidney disease 09/03/2012   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   Asthma, chronic 06/04/2012   COLONIC POLYPS, HX OF 04/13/2009   PCP:  [name], NP Pharmacy:   [facility] [address] [place] - [address] [id number] [place] [address] Phone: [phone] Fax: [phone]  [place], [place] - [place] [address] [place] [address] Phone: [phone] Fax: [phone]     Social Determinants of Health (SDOH) Interventions    Readmission Risk Interventions No flowsheet data found.

## 2020-09-27 NOTE — Procedures (Signed)
Central Venous Catheter Insertion Procedure Note  Henry Carroll  [id number]  19-May-1950  Date:09/14/2020  Time:9:29 AM   Provider Performing:Pete Johnette Abraham Kary Kos   Procedure: Insertion of Non-tunneled Central Venous Catheter(36556)with US guidance (36468)    Indication(s) Hemodialysis  Consent Unable to obtain consent due to emergent nature of procedure.  Anesthesia Topical only with 1% lidocaine   Timeout Verified patient identification, verified procedure, site/side was marked, verified correct patient position, special equipment/implants available, medications/allergies/relevant history reviewed, required imaging and test results available.  Sterile Technique Maximal sterile technique including full sterile barrier drape, hand hygiene, sterile gown, sterile gloves, mask, hair covering, sterile ultrasound probe cover (if used).  Procedure Description Area of catheter insertion was cleaned with chlorhexidine and draped in sterile fashion.   With real-time ultrasound guidance a HD catheter was placed into the left internal jugular vein.  Nonpulsatile blood flow and easy flushing noted in all ports.  The catheter was sutured in place and sterile dressing applied.  Complications/Tolerance None; patient tolerated the procedure well. Chest X-ray is ordered to verify placement for internal jugular or subclavian cannulation.  Chest x-ray is not ordered for femoral cannulation.  EBL Minimal  Specimen(s) None   Erick Colace ACNP-BC [place] Pager # [phone] OR # [phone] if no answer

## 2020-09-27 NOTE — Progress Notes (Signed)
ANTICOAGULATION CONSULT NOTE - Follow Up Consult  Pharmacy Consult for Heparin  Indication:  IABP  Not on File  Patient Measurements: Height: 6\' 2"  (188 cm) Weight: 90.7 kg (199 lb 15.3 oz) IBW/kg (Calculated) : 82.2  Vital Signs: Temp: 98.2 F (36.8 C) (08/16 2100) Temp Source: Core (08/16 2000) BP: 131/99 (08/16 2100) Pulse Rate: 123 (08/16 2100)  Labs: Recent Labs    10/05/2020 2024 10/03/2020 2129 09/16/2020 2247 10/02/2020 0325 09/19/2020 0451 10/07/2020 0542 10/08/2020 1226 10/04/2020 1552 09/15/2020 1944 10/09/2020 1950  HGB 12.7*  --    < > 10.9* 12.6*  --   --   --   --  12.2*  HCT 38.9*  --    < > 35.2* 37.0*  --   --   --   --  36.0*  PLT 239  --   --  184  --   --   --   --   --   --   LABPROT 19.9*  --   --   --   --   --   --   --   --   --   INR 1.7*  --   --   --   --   --   --   --   --   --   HEPARINUNFRC  --   --   --   --   --   --  0.15*  --  0.30  --   CREATININE 6.80*  --   --  7.38*  --   --   --  6.58*  --   --   TROPONINIHS 6,552* 7,265*  --  24,401*  --  18,109*  --   --   --   --    < > = values in this interval not displayed.     Estimated Creatinine Clearance: 11.8 mL/min (A) (by C-G formula based on SCr of 6.58 mg/dL (H)).   Medical History: Past Medical History:  Diagnosis Date   Abnormal stress test 02/10/2015   Acute on chronic systolic and diastolic heart failure, NYHA class 1 ([place]) 12/31/2014   Anemia    Anemia in chronic kidney disease 09/03/2012   Arthritis    left  5th finger   Asthma    Asthma, chronic 06/04/2012   Asthma, chronic, mild intermittent, with acute exacerbation 12/22/2015   Bilateral lower extremity edema 12/22/2015   CAD (coronary artery disease) 02/18/2015   Chronic combined systolic (congestive) and diastolic (congestive) heart failure ([place])    a. 12/31/14: 2D ECHO: EF 40-45%, HK of inf myocardium, G1DD, mod MR   Chronic combined systolic and diastolic CHF (congestive heart failure) ([place]) 02/72/5366   Chronic systolic heart  failure ([place]) 02/25/2015   CKD (chronic kidney disease), stage III (HCC)    stage 3 kidney disease   CKD (chronic kidney disease), stage V ([place]) 08/30/2018   COLONIC POLYPS, HX OF 04/13/2009   Qualifier: Diagnosis of  By: Westly Pam.    Coronary artery disease    a. LHC 01/2015 - triple vessel CAD (mod oLM, mLAD, severe mRCA, intermediate branch stenosis, CTO of mCx). Plan CABG 02/2015.   Demand ischemia ([place]) 12/31/2014   DM type 2, uncontrolled, with renal complications ([place]) 4/40/3474   Dyspnea     due to fluid   Elevated troponin    Essential hypertension 2/59/5638   Folliculitis of perineum 10/01/2012   Gastric erosion    GERD (gastroesophageal reflux  disease)    History of blood transfusion    Hyperlipidemia    Hyperlipidemia with target LDL less than 100 12/31/2014   Hypertension    Hypertensive heart disease with heart failure ([place]) 02/25/2015   IDA (iron deficiency anemia) 01/23/2016   Melena 01/23/2016   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   Mitral regurgitation    a. Mild-mod by echo 12/2014.   Morbid obesity ([place]) 12/29/2014   Myocardial infarction [facility])    pt. states per [name] he has in the past   NSVT (nonsustained ventricular tachycardia) ([place])    a. 9 beats during 01/2015 adm. BB titrated.   Nuclear sclerotic cataract of left eye 05/26/2019   Nuclear sclerotic cataract of right eye 05/26/2019   Obesity    a. BMI 33   Obesity (BMI 30-39.9) 05/05/2013   OSA (obstructive sleep apnea) 01/16/2018   Mild obstructive sleep apnea overall with an AHI of 7.3/h and no significant central sleep apnea. Severe obstructive sleep apnea during REM sleep with an AHI of 34.3/h.  Now on CPAP at 6 cm H2O.    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 Pt was taken to cath where IABP was placed. Pt has ESRD on HD.  HL 0.3 tonight at goal on heparin drip rate 1000 uts/hr + 500 uts/hr in CRRT circuit No bleeding or issues with line per RN.    Goal of Therapy:  Heparin level 0.2-0.5 units/ml Monitor platelets by anticoagulation protocol: Yes   Plan:  Continue heparin drip at 1000 units/hr And heparin 500 uts/h r in CRRT circuit  Daily CBC/heparin level Monitor for bleeding     Bonnita Nasuti Pharm.D. CPP, BCPS Clinical Pharmacist [phone] 09/24/2020 9:07 PM   Please check [email or web address] for unit-specific pharmacy phone numbers.

## 2020-09-27 NOTE — Consult Note (Signed)
NAME:  Henry Carroll, MRN:  [id number], DOB:  1950-07-03, LOS: 0 ADMISSION DATE:  09/13/2020, CONSULTATION DATE:  8/16 REFERRING MD:  Dr. Haroldine Laws , CHIEF COMPLAINT:  cardiogenic shock   History of Present Illness:  Patient is encephalopathic and/or intubated. Therefore history has been obtained from chart review.  70 year old male with past medical history as below, which is significant for end-stage renal disease on dialysis with most recent dialysis 8/15, coronary artery disease status post four-vessel CABG, heart failure with reduced ejection fraction, asthma, diabetes mellitus, and obstructive sleep apnea. He presented to [place] ED 8/15 with complaints of chest pain x 2 days. In the ED he was noted to be hypotensive with cool extremities concerning for cardiogenic shock. Bedside echo showed severely decreased ejection fraction. Troponin was elevated. He was transferred to [facility] for possible invasive cardiac evaluation.   Upon arrival to [facility] he was taken immediately to the cath lab for NSTEMI. He was not able to tolerate the supine position and required intubation. Grafts were felt to be patent, but EF noted to be around 5-10%. Balloon pump placed. He did not require pressors during the case. Post procedurally he was taken to the ICU for ongoing care.   Pertinent  Medical History   has a past medical history of Abnormal stress test (02/10/2015), Acute on chronic systolic and diastolic heart failure, NYHA class 1 ([place]) (12/31/2014), Anemia, Anemia in chronic kidney disease (09/03/2012), Arthritis, Asthma, Asthma, chronic (06/04/2012), Asthma, chronic, mild intermittent, with acute exacerbation (12/22/2015), Bilateral lower extremity edema (12/22/2015), CAD (coronary artery disease) (02/18/2015), Chronic combined systolic (congestive) and diastolic (congestive) heart failure ([place]), Chronic combined systolic and diastolic CHF (congestive heart failure) ([place]) (02/10/2015),  Chronic systolic heart failure ([place]) (02/25/2015), CKD (chronic kidney disease), stage III ([place]), CKD (chronic kidney disease), stage V ([place]) (08/30/2018), COLONIC POLYPS, HX OF (04/13/2009), Coronary artery disease, Demand ischemia ([place]) (12/31/2014), DM type 2, uncontrolled, with renal complications ([place]) (2/94/7654), Dyspnea, Elevated troponin, Essential hypertension (6/50/3546), Folliculitis of perineum (10/01/2012), Gastric erosion, GERD (gastroesophageal reflux disease), History of blood transfusion, Hyperlipidemia, Hyperlipidemia with target LDL less than 100 (12/31/2014), Hypertension, Hypertensive heart disease with heart failure ([place]) (02/25/2015), IDA (iron deficiency anemia) (01/23/2016), Melena (01/23/2016), 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), Mitral regurgitation, Morbid obesity ([place]) (12/29/2014), Myocardial infarction [facility]), NSVT (nonsustained ventricular tachycardia) ([place]), Nuclear sclerotic cataract of left eye (05/26/2019), Nuclear sclerotic cataract of right eye (05/26/2019), Obesity, Obesity (BMI 30-39.9) (05/05/2013), OSA (obstructive sleep apnea) (01/16/2018), Other malaise and fatigue (09/03/2012), Other testicular hypofunction (09/03/2012), PAD (peripheral artery disease) ([place]) (03/02/2015), Pneumonia, Sleep apnea, Small bowel lesion, Symptomatic anemia (08/29/2018), Transfusion-dependent anemia (12/29/2015), Type II diabetes mellitus ([place]), Vitreous hemorrhage of right eye ([place]) (05/26/2019), and Wears dentures.  Significant Hospital Events: Including procedures, antibiotic start and stop dates in addition to other pertinent events   8/16 admit to [facility]  Interim History / Subjective:    Objective   Blood pressure (!) 109/92, pulse (!) 137, temperature (!) 97.1 F (36.2 C), temperature source Oral, resp. rate (!) 23, height $RemoveBe'6\' 2"'ZFsBCnBNk$  (1.88 m), weight 91 kg, SpO2 100 %.    Vent Mode: PRVC FiO2 (%):  [100 %] 100 % Set Rate:  [18 bmp] 18 bmp Vt Set:  [650 mL] 650  mL PEEP:  [5 cmH20] 5 cmH20   Intake/Output Summary (Last 24 hours) at 09/18/2020 0243 Last data filed at 09/13/2020 2036 Gross per 24 hour  Intake 500 ml  Output --  Net 500 ml  Filed Weights   10/03/2020 1914  Weight: 91 kg    Examination: General: Obese elderly male in NAD on vent.  HENT: ETT, /AT. Pupils pinpoint Lungs: Clear Cardiovascular: Tachy, regular, no MRG Abdomen: Soft, non-distended Extremities: No acute deformity Neuro: Sedated  Resolved Hospital Problem list     Assessment & Plan:   Cardiogenic shock: has been worked up by cardiology. It is not felt to be ischemic in nature. Viral myocarditis? EF at 5-10% by bedside echo. Balloon pump placed in cath lab.  Acute on chronic HFrEF - Admit to ICU - IABP/PA cath per cardiology - SBP 130s without pressors - minimize sedatives - Ensure lactic acid clearing.  - Will need volume removal, no emergent HD indications right now. Will call nephrology first thing in the morning. Has good BP so will hopefully tolerate iHD.  - Formal echo in AM  Acute hypoxemic respiratory failure OSA - Full vent support - Check CXR and ABG - VAP bundle - Daily SBT  - RASS goal -1 to -2 - DC versed, worry it will accumulate and he will be difficult to wake up - Fentanyl infusion for RASS goal - PRN versed  ESRD on HD (MWF) Hyponatremia: hypervolemic AGMA: lactic acid 11 on admission.  - Nephrology consultation as above - Trend BMP  NSTEMI:  likely demand ischemia for poor global perfusion - per cardiology - ensure troponin trending down.   DM - CBG monitoring and SSI  Best Practice (right click and "Reselect all SmartList Selections" daily)   Diet/type: NPO DVT prophylaxis: prophylactic heparin  GI prophylaxis: PPI Lines: Central line Foley:  N/A Code Status:  full code Last date of multidisciplinary goals of care discussion $RemoveBeforeD'[]'[id number]$   Labs   CBC: Recent Labs  Lab 10/05/2020 2024 09/12/2020 2247 09/16/2020 0201  WBC  7.6  --   --   NEUTROABS 6.2  --   --   HGB 12.7* 13.3 14.6  HCT 38.9* 39.0 43.0  MCV 109.9*  --   --   PLT 239  --   --     Basic Metabolic Panel: Recent Labs  Lab 09/28/2020 2024 10/06/2020 2247 09/22/2020 0201  NA 133* 133* 133*  K 5.1 4.7 5.7*  CL 92*  --   --   CO2 11*  --   --   GLUCOSE 180*  --   --   BUN 47*  --   --   CREATININE 6.80*  --   --   CALCIUM 9.0  --   --    GFR: Estimated Creatinine Clearance: 11.4 mL/min (A) (by C-G formula based on SCr of 6.8 mg/dL (H)). Recent Labs  Lab 09/13/2020 2024  WBC 7.6  LATICACIDVEN >11.0*    Liver Function Tests: Recent Labs  Lab 09/24/2020 2024  AST 99*  ALT <5  ALKPHOS 123  BILITOT 2.1*  PROT 6.6  ALBUMIN 3.8   Recent Labs  Lab 09/13/2020 2024  LIPASE 38   No results for input(s): AMMONIA in the last 168 hours.  ABG    Component Value Date/Time   PHART 7.388 09/25/2020 2247   PCO2ART 25.4 (L) 09/16/2020 2247   PO2ART 161 (H) 10/11/2020 2247   HCO3 16.2 (L) 10/06/2020 0201   TCO2 17 (L) 09/22/2020 0201   ACIDBASEDEF 11.0 (H) 09/26/2020 0201   O2SAT 65.0 09/22/2020 0201     Coagulation Profile: Recent Labs  Lab 09/19/2020 2024  INR 1.7*    Cardiac Enzymes: No results for input(s): CKTOTAL, CKMB,  CKMBINDEX, TROPONINI in the last 168 hours.  HbA1C: Hemoglobin A1C  Date/Time Value Ref Range Status  03/03/2020 12:00 AM 7.0  Final   Hgb A1c MFr Bld  Date/Time Value Ref Range Status  09/14/2020 01:52 PM 7.9 (H) <5.7 % of total Hgb Final    Comment:    For someone without known diabetes, a hemoglobin A1c value of 6.5% or greater indicates that they may have  diabetes and this should be confirmed with a follow-up  test. . For someone with known diabetes, a value <7% indicates  that their diabetes is well controlled and a value  greater than or equal to 7% indicates suboptimal  control. A1c targets should be individualized based on  duration of diabetes, age, comorbid conditions, and  other  considerations. . Currently, no consensus exists regarding use of hemoglobin A1c for diagnosis of diabetes for children. .   11/23/2019 09:50 AM 8.6 (H) <5.7 % of total Hgb Final    Comment:    For someone without known diabetes, a hemoglobin A1c value of 6.5% or greater indicates that they may have  diabetes and this should be confirmed with a follow-up  test. . For someone with known diabetes, a value <7% indicates  that their diabetes is well controlled and a value  greater than or equal to 7% indicates suboptimal  control. A1c targets should be individualized based on  duration of diabetes, age, comorbid conditions, and  other considerations. . Currently, no consensus exists regarding use of hemoglobin A1c for diagnosis of diabetes for children. .     CBG: Recent Labs  Lab 10/11/2020 1936 10/06/2020 2007 10/12/2020 2107 09/12/2020 2150 09/21/2020 2304  GLUCAP 48* 190* 88 90 96    Review of Systems:   Patient is encephalopathic and/or intubated. Therefore history has been obtained from chart review.    Past Medical History:  He,  has a past medical history of Abnormal stress test (02/10/2015), Acute on chronic systolic and diastolic heart failure, NYHA class 1 ([place]) (12/31/2014), Anemia, Anemia in chronic kidney disease (09/03/2012), Arthritis, Asthma, Asthma, chronic (06/04/2012), Asthma, chronic, mild intermittent, with acute exacerbation (12/22/2015), Bilateral lower extremity edema (12/22/2015), CAD (coronary artery disease) (02/18/2015), Chronic combined systolic (congestive) and diastolic (congestive) heart failure ([place]), Chronic combined systolic and diastolic CHF (congestive heart failure) ([place]) (57/02/7791), Chronic systolic heart failure ([place]) (02/25/2015), CKD (chronic kidney disease), stage III ([place]), CKD (chronic kidney disease), stage V ([place]) (08/30/2018), COLONIC POLYPS, HX OF (04/13/2009), Coronary artery disease, Demand ischemia ([place]) (12/31/2014), DM type 2, uncontrolled,  with renal complications ([place]) (10/16/90), Dyspnea, Elevated troponin, Essential hypertension (05/11/760), Folliculitis of perineum (10/01/2012), Gastric erosion, GERD (gastroesophageal reflux disease), History of blood transfusion, Hyperlipidemia, Hyperlipidemia with target LDL less than 100 (12/31/2014), Hypertension, Hypertensive heart disease with heart failure ([place]) (02/25/2015), IDA (iron deficiency anemia) (01/23/2016), Melena (01/23/2016), 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), Mitral regurgitation, Morbid obesity ([place]) (12/29/2014), Myocardial infarction [facility]), NSVT (nonsustained ventricular tachycardia) ([place]), Nuclear sclerotic cataract of left eye (05/26/2019), Nuclear sclerotic cataract of right eye (05/26/2019), Obesity, Obesity (BMI 30-39.9) (05/05/2013), OSA (obstructive sleep apnea) (01/16/2018), Other malaise and fatigue (09/03/2012), Other testicular hypofunction (09/03/2012), PAD (peripheral artery disease) ([place]) (03/02/2015), Pneumonia, Sleep apnea, Small bowel lesion, Symptomatic anemia (08/29/2018), Transfusion-dependent anemia (12/29/2015), Type II diabetes mellitus ([place]), Vitreous hemorrhage of right eye ([place]) (05/26/2019), and Wears dentures.   Surgical History:   Past Surgical History:  Procedure Laterality Date   BACK SURGERY     BASCILIC VEIN TRANSPOSITION Left 11/14/2018  Procedure: FIRST STAGE BASILIC VEIN TRANSPOSITION LEFT ARM;  Surgeon: Nada Libman, MD;  Location: [facility] OR;  Service: Vascular;  Laterality: Left;   BASCILIC VEIN TRANSPOSITION Left 01/28/2019   Procedure: BASILIC VEIN TRANSPOSITION SECOND STAGE;  Surgeon: Nada Libman, MD;  Location: [facility] OR;  Service: Vascular;  Laterality: Left;   BONE MARROW BIOPSY     CARDIAC CATHETERIZATION N/A 02/09/2015   Procedure: Left Heart Cath and Coronary Angiography;  Surgeon: Kathleene Hazel, MD;  Location: [facility] INVASIVE CV LAB;  Service: Cardiovascular;  Laterality: N/A;   CATARACT EXTRACTION  Bilateral 06/11/2020   COLONOSCOPY  2011   Dr. Jena Gauss: Ileocecal valve appeared normal, scattered pancolonic diverticulosis, difficult bowel prep making smaller lesions potentially missed. Recommended three-year follow-up colonoscopy.   COLONOSCOPY  2003   Dr. Jena Gauss: Suspicious lesion at the ileocecal valve, multiple biopsies benign, pancolonic diverticulosis.   COLONOSCOPY N/A 02/15/2016   diverticulosis in sigmoid and descending colon, single 5 mm polyp at splenic flexure (Tubular adenoma)   CORONARY ARTERY BYPASS GRAFT N/A 02/18/2015   Procedure: CORONARY ARTERY BYPASS GRAFTING (CABG) x  four, using bilateral internal mammary arteries and right leg greater saphenous vein harvested endoscopically;  Surgeon: Alleen Borne, MD;  Location: [facility] OR;  Service: Open Heart Surgery;  Laterality: N/A;   ESOPHAGOGASTRODUODENOSCOPY N/A 02/15/2016   normal   EYE SURGERY Right    July 2019    EYE SURGERY Left 06/2020   GIVENS CAPSULE STUDY N/A 01/08/2017   couple of gastric and small bowel eroions in setting of aspirin 81 mg daily but nothing concerning, continue Hematology follow-up   HERNIA REPAIR  1990   Umbilical   LUMBAR DISC SURGERY  2006   L4 & L5   REFRACTIVE SURGERY Left    2019    REFRACTIVE SURGERY Right    2019   TEE WITHOUT CARDIOVERSION N/A 02/18/2015   Procedure: TRANSESOPHAGEAL ECHOCARDIOGRAM (TEE);  Surgeon: Alleen Borne, MD;  Location[facility];  Service: Open Heart Surgery;  Laterality: N/A;   TONSILLECTOMY  1962     Social History:   reports that he quit smoking about 5 years ago. His smoking use included cigars. He has never used smokeless tobacco. He reports that he does not currently use alcohol after a past usage of about 3.0 standard drinks per week. He reports that he does not use drugs.   Family History:  His family history includes Colon cancer in his paternal uncle; Diabetes in his mother; Heart disease in his father and mother; Hypertension in his father; Kidney  disease in his daughter; Stroke in his sister; Sudden death in his daughter. There is no history of Heart attack.   Allergies Not on File   Home Medications  Prior to Admission medications   Medication Sig Start Date End Date Taking? Authorizing Provider  acetaminophen (TYLENOL) 500 MG tablet Take 1,000 mg by mouth as needed.    [provider]  albuterol (PROVENTIL) (2.5 MG/3ML) 0.083% nebulizer solution Take 3 mLs (2.5 mg total) by nebulization every 6 (six) hours as needed for wheezing or shortness of breath. 09/14/20   Sharon Seller, NP  amLODipine (NORVASC) 10 MG tablet Take 1 tablet by mouth once daily 01/25/20   Sharon Seller, NP  aspirin EC 81 MG tablet Take 1 tablet (81 mg total) by mouth daily. 01/24/16   Rosalio Macadamia, NP  atorvastatin (LIPITOR) 40 MG tablet TAKE 1 TABLET BY MOUTH ONCE DAILY IN THE EVENING 02/03/20  [name], NP  carvedilol (COREG) 12.5 MG tablet Take 12.5 mg by mouth daily.  08/26/19   [provider]  Cholecalciferol (VITAMIN D3) 25 MCG (1000 UT) CAPS Take 2 capsules by mouth daily. 08/26/19   [provider]  Cinnamon 500 MG capsule Take 1,000 mg by mouth daily.     [provider]  Coenzyme Q10 10 MG capsule Take 10 mg by mouth daily. 08/26/19   [provider]  epoetin alfa-epbx (RETACRIT) 16109 UNIT/ML injection 20,000 Units every 14 (fourteen) days.     Edrick Oh, MD  ezetimibe (ZETIA) 10 MG tablet Take 1 tablet (10 mg total) by mouth daily. 10/07/19 02/12/58  Burtis Junes, NP  fluticasone (FLONASE) 50 MCG/ACT nasal spray Place 2 sprays into both nostrils daily as needed for allergies or rhinitis (SEASONAL ALLERGIES).    [provider]  fluticasone-salmeterol (ADVAIR DISKUS) 100-50 MCG/ACT AEPB Inhale 1 puff into the lungs 2 (two) times daily. 09/14/20   [name], NP  furosemide (LASIX) 40 MG tablet Take 40 mg by mouth. On S-M-W-F Takes  1 tablet by mouth once d    [provider]  glucose blood (ONE TOUCH ULTRA TEST) test strip Use to test blood sugar three times daily. Dx: E11.21 12/04/17   Gildardo Cranker, DO  insulin detemir (LEVEMIR) 100 UNIT/ML injection Inject 0.18 mLs (18 Units total) into the skin daily. APPOINTMENT OVERDUE 09/12/20   [name], NP  isosorbide dinitrate (ISORDIL) 10 MG tablet Take by mouth.    [provider]  montelukast (SINGULAIR) 10 MG tablet TAKE 1 TABLET BY MOUTH AT BEDTIME 02/03/20   [name], NP  Multiple Vitamin (MULTIVITAMIN WITH MINERALS) TABS tablet Take 1 tablet by mouth daily.    [provider]  nitroGLYCERIN (NITROSTAT) 0.4 MG SL tablet Place 1 tablet (0.4 mg total) under the tongue every 5 (five) minutes as needed for chest pain. 09/13/20 10/03/21  Jerline Pain, MD  PROAIR HFA 108 [phone] Base) MCG/ACT inhaler INHALE 2 PUFFS BY MOUTH EVERY 6 HOURS AS NEEDED FOR SHORTNESS OF BREATH 09/14/20   [name], NP  vitamin E 45 MG (100 UNITS) capsule Take by mouth.    [provider]     Critical care time: 39 minutes     Georgann Housekeeper, AGACNP-BC [facility]  See Amion for personal pager PCCM on call pager [phone] until 7pm. Please call [place] 7p-7a. [phone]  09/21/2020 3:44 AM

## 2020-09-27 NOTE — Progress Notes (Signed)
eLink Physician-Brief Progress Note Patient Name: Henry Carroll DOB: 1950-05-09 MRN: [id number]   Date of Service  10/01/2020  HPI/Events of Note  Respiratory alkalosis and high PO2 on current ABG.  eICU Interventions  Tidal volume reduced to 7 ml / kg, respiratory rate reduced to 16, FIO2 reduced to 35 %, ABG in one hour, orders were given to RT as a verbal order.        Frederik Pear 10/04/2020, 8:37 PM

## 2020-09-27 NOTE — Anesthesia Procedure Notes (Signed)
Procedure Name: Intubation Date/Time: 09/14/2020 1:28 AM Performed by: Annye Asa, MD Pre-anesthesia Checklist: Patient identified, Emergency Drugs available, Suction available, Patient being monitored and Timeout performed Patient Re-evaluated:Patient Re-evaluated prior to induction Oxygen Delivery Method: Ambu bag Preoxygenation: Pre-oxygenation with 100% oxygen Induction Type: IV induction, Cricoid Pressure applied and Rapid sequence Laryngoscope Size: Glidescope and 4 Tube type: Oral Tube size: 7.5 mm Number of attempts: 1 Airway Equipment and Method: Video-laryngoscopy Placement Confirmation: ETT inserted through vocal cords under direct vision, CO2 detector and breath sounds checked- equal and bilateral Secured at: 23 cm Tube secured with: respiratory securement device. Dental Injury: Teeth and Oropharynx as per pre-operative assessment

## 2020-09-27 NOTE — Consult Note (Signed)
Advanced Heart Failure Team Consult Note   Primary Physician: Sharon Seller, NP PCP-Cardiologist:  Donato Schultz, MD  Reason for Consultation: Cardiogenic shock  HPI:    Henry Carroll is seen today for evaluation of cardiogenic shock at the request of Dr. Eldridge Dace.   70 y/o male with HTN,. DM2, OSA, CAD s/p CABG 2017, chronic systolic HF (EF 40-45% in 9/20), ESRD (started HD in 7/21).  Has been doing relatively well. Very functional and independent. Recently got back from trip to [place].   Over past week increasing fatigue and SOB. In ast day or two began developing CP.   Presented to [facility] this evening with prominent sinus tach 130-140 and evidence of poor perfusion on exam. Bicarb 11 Lactic acid > 11.0 x 2. Hstrop 6,552. ECG sinus tach 143 no acute ST-T wave abnormalities  Transferred to [facility] ED. Bedside echo shows EF 5-10%. Seen by cardiology night coverage who called Dr. Eldridge Dace due to ongoing CP and shock.   I discussed case with Dr. Eldridge Dace and patient taken to cath lab for angiography, swan and IABP placement While in lab decompensated and intubated.   Angiography with stable CAD with 3/4 grafts patent. Felt to have myo-pericarditis RA 17 RV 50/8 PA 55/35 (42) PCWP 44 LVED 31 Fick 4.6/2.1  Now on IABP  Swan numbers  CVP 13 Thermo 3.4/1.6  On vent will arouse and follow commands   Review of Systems: not available as patient intubated  Home Medications Prior to Admission medications   Medication Sig Start Date End Date Taking? Authorizing Provider  acetaminophen (TYLENOL) 500 MG tablet Take 1,000 mg by mouth as needed.    [provider]  albuterol (PROVENTIL) (2.5 MG/3ML) 0.083% nebulizer solution Take 3 mLs (2.5 mg total) by nebulization every 6 (six) hours as needed for wheezing or shortness of breath. 09/14/20   Sharon Seller, NP  amLODipine (NORVASC) 10 MG tablet Take 1 tablet by mouth once daily 01/25/20   Sharon Seller, NP   aspirin EC 81 MG tablet Take 1 tablet (81 mg total) by mouth daily. 01/24/16   Rosalio Macadamia, NP  atorvastatin (LIPITOR) 40 MG tablet TAKE 1 TABLET BY MOUTH ONCE DAILY IN THE EVENING 02/03/20   Sharon Seller, NP  carvedilol (COREG) 12.5 MG tablet Take 12.5 mg by mouth daily.  08/26/19   [provider]  Cholecalciferol (VITAMIN D3) 25 MCG (1000 UT) CAPS Take 2 capsules by mouth daily. 08/26/19   [provider]  Cinnamon 500 MG capsule Take 1,000 mg by mouth daily.     [provider]  Coenzyme Q10 10 MG capsule Take 10 mg by mouth daily. 08/26/19   [provider]  epoetin alfa-epbx (RETACRIT) 29686 UNIT/ML injection 20,000 Units every 14 (fourteen) days.     Elvis Coil, MD  ezetimibe (ZETIA) 10 MG tablet Take 1 tablet (10 mg total) by mouth daily. 10/07/19 02/12/58  Rosalio Macadamia, NP  fluticasone (FLONASE) 50 MCG/ACT nasal spray Place 2 sprays into both nostrils daily as needed for allergies or rhinitis (SEASONAL ALLERGIES).    [provider]  fluticasone-salmeterol (ADVAIR DISKUS) 100-50 MCG/ACT AEPB Inhale 1 puff into the lungs 2 (two) times daily. 09/14/20   Sharon Seller, NP  furosemide (LASIX) 40 MG tablet Take 40 mg by mouth. On S-M-W-F Takes  1 tablet by mouth once d    [provider]  glucose blood (ONE TOUCH ULTRA TEST) test strip Use  to test blood sugar three times daily. Dx: E11.21 12/04/17   Gildardo Cranker, DO  insulin detemir (LEVEMIR) 100 UNIT/ML injection Inject 0.18 mLs (18 Units total) into the skin daily. APPOINTMENT OVERDUE 09/12/20   [name], NP  isosorbide dinitrate (ISORDIL) 10 MG tablet Take by mouth.    [provider]  montelukast (SINGULAIR) 10 MG tablet TAKE 1 TABLET BY MOUTH AT BEDTIME 02/03/20   [name], NP  Multiple Vitamin (MULTIVITAMIN WITH MINERALS) TABS tablet Take 1 tablet by mouth daily.    [provider]  nitroGLYCERIN (NITROSTAT) 0.4 MG SL tablet Place 1  tablet (0.4 mg total) under the tongue every 5 (five) minutes as needed for chest pain. 09/13/20 10/03/21  Jerline Pain, MD  PROAIR HFA 108 [phone] Base) MCG/ACT inhaler INHALE 2 PUFFS BY MOUTH EVERY 6 HOURS AS NEEDED FOR SHORTNESS OF BREATH 09/14/20   [name], NP  vitamin E 45 MG (100 UNITS) capsule Take by mouth.    [provider]    Past Medical History: Past Medical History:  Diagnosis Date   Abnormal stress test 02/10/2015   Acute on chronic systolic and diastolic heart failure, NYHA class 1 ([place]) 12/31/2014   Anemia    Anemia in chronic kidney disease 09/03/2012   Arthritis    left  5th finger   Asthma    Asthma, chronic 06/04/2012   Asthma, chronic, mild intermittent, with acute exacerbation 12/22/2015   Bilateral lower extremity edema 12/22/2015   CAD (coronary artery disease) 02/18/2015   Chronic combined systolic (congestive) and diastolic (congestive) heart failure ([place])    a. 12/31/14: 2D ECHO: EF 40-45%, HK of inf myocardium, G1DD, mod MR   Chronic combined systolic and diastolic CHF (congestive heart failure) ([place]) 54/27/0623   Chronic systolic heart failure ([place]) 02/25/2015   CKD (chronic kidney disease), stage III (HCC)    stage 3 kidney disease   CKD (chronic kidney disease), stage V ([place]) 08/30/2018   COLONIC POLYPS, HX OF 04/13/2009   Qualifier: Diagnosis of  By: Westly Pam.    Coronary artery disease    a. LHC 01/2015 - triple vessel CAD (mod oLM, mLAD, severe mRCA, intermediate branch stenosis, CTO of mCx). Plan CABG 02/2015.   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 Mild-mod by echo 12/2014.   Morbid obesity ([place]) 12/29/2014   Myocardial infarction [facility])    pt. states per [name] he has in the past   NSVT (nonsustained ventricular tachycardia) ([place])    a. 9 beats during 01/2015 adm. BB titrated.   Nuclear sclerotic cataract of left eye 05/26/2019   Nuclear sclerotic cataract of right eye 05/26/2019   Obesity    a. BMI 33   Obesity (BMI 30-39.9) 05/05/2013   OSA (obstructive sleep apnea) 01/16/2018   Mild obstructive sleep apnea overall with  an AHI of 7.3/h and no significant central sleep apnea. Severe obstructive sleep apnea during REM sleep with an AHI of 34.3/h.  Now on CPAP at 6 cm H2O.    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   Type II diabetes mellitus ([place])    Vitreous hemorrhage of right eye ([place]) 05/26/2019   Wears dentures     Past Surgical History: Past Surgical History:  Procedure Laterality Date   BACK SURGERY     BASCILIC VEIN TRANSPOSITION Left 11/14/2018   Procedure: FIRST STAGE BASILIC VEIN TRANSPOSITION LEFT ARM;  Surgeon: Serafina Mitchell, MD;  Location: [place];  Service: Vascular;  Laterality: Left;   [place] Left 01/28/2019   Procedure: BASILIC VEIN TRANSPOSITION SECOND STAGE;  Surgeon: Serafina Mitchell, MD;  Location: [place];  Service: Vascular;  Laterality: Left;   BONE MARROW BIOPSY     CARDIAC CATHETERIZATION N/A 02/09/2015   Procedure: Left Heart Cath and Coronary Angiography;  Surgeon: Burnell Blanks, MD;  Location: [place] [facility];  Service:  Cardiovascular;  Laterality: N/A;   CATARACT EXTRACTION Bilateral 06/11/2020   COLONOSCOPY  2011   Dr. [name]: Ileocecal valve appeared normal, scattered pancolonic diverticulosis, difficult bowel prep making smaller lesions potentially missed. Recommended three-year follow-up colonoscopy.   COLONOSCOPY  2003   Dr. [name]: Suspicious lesion at the ileocecal valve, multiple biopsies benign, pancolonic diverticulosis.   COLONOSCOPY N/A 02/15/2016   diverticulosis in sigmoid and descending colon, single 5 mm polyp at splenic flexure (Tubular adenoma)   CORONARY ARTERY BYPASS GRAFT N/A 02/18/2015   Procedure: CORONARY ARTERY BYPASS GRAFTING (CABG) x  four, using bilateral internal mammary arteries and right leg greater saphenous vein harvested endoscopically;  Surgeon: [name], MD;  Location: [place];  Service: Open Heart Surgery;  Laterality: N/A;   ESOPHAGOGASTRODUODENOSCOPY N/A 02/15/2016   normal   EYE SURGERY Right    July 2019    EYE SURGERY Left 06/2020   GIVENS CAPSULE STUDY N/A 01/08/2017   couple of gastric and small bowel eroions in setting of aspirin 81 mg daily but nothing concerning, continue Hematology follow-up   HERNIA REPAIR  0962   Umbilical   LUMBAR [place]  2006   L4 & L5   REFRACTIVE SURGERY Left    2019    REFRACTIVE SURGERY Right    2019   TEE WITHOUT CARDIOVERSION N/A 02/18/2015   Procedure: TRANSESOPHAGEAL ECHOCARDIOGRAM (TEE);  Surgeon: [name], MD;  Location: [place];  Service: Open Heart Surgery;  Laterality: N/A;   TONSILLECTOMY  1962    Family History: Family History  Problem Relation Age of Onset   Diabetes Mother    Heart disease Mother        later in life, age >34   Heart disease Father        heart failure later in life   Hypertension Father    Stroke Sister    Kidney disease Daughter    Sudden death Daughter    Colon cancer Paternal Uncle        Passed from colon CA   Heart attack Neg Hx     Social History: Social History    Socioeconomic History   Marital status: Married  Spouse name: Not on file   Number of children: Not on file   Years of education: Not on file   Highest education level: Not on file  Occupational History   Not on file  Tobacco Use   Smoking status: Former    Types: Cigars    Quit date: 12/31/2014    Years since quitting: 5.7   Smokeless tobacco: Never  Vaping Use   Vaping Use: Never used  Substance and Sexual Activity   Alcohol use: Not Currently    Alcohol/week: 3.0 standard drinks    Types: 3 Glasses of wine per week   Drug use: No   Sexual activity: Yes    Birth control/protection: None  Other Topics Concern   Not on file  Social History Narrative   Not on file   Social Determinants of Health   Financial Resource Strain: Not on file  Food Insecurity: Not on file  Transportation Needs: Not on file  Physical Activity: Not on file  Stress: Not on file  Social Connections: Not on file    Allergies:  Not on File  Objective:    Vital Signs:   Temp:  [97.1 F (36.2 C)] 97.1 F (36.2 C) (08/15 1917) Pulse Rate:  [111-144] 137 (08/16 0015) Resp:  [12-27] 23 (08/16 0015) BP: (93-124)/(56-93) 109/92 (08/16 0015) SpO2:  [77 %-100 %] 100 % (08/16 0015) Weight:  [91 kg] 91 kg (08/15 1914)    Weight change: Filed Weights   10/03/2020 1914  Weight: 91 kg    Intake/Output:   Intake/Output Summary (Last 24 hours) at 09/19/2020 0025 Last data filed at 09/27/2020 2036 Gross per 24 hour  Intake 500 ml  Output --  Net 500 ml      Physical Exam    General:  Sedated on vent HEENT: normal +ETT Neck: supple. JVP to jaw Carotids 2+ bilat; no bruits. No lymphadenopathy or thyromegaly appreciated. Cor: PMI nondisplaced. Regular tachy +s3 Lungs: clear Abdomen: soft, nontender, nondistended. No hepatosplenomegaly. No bruits or masses. Good bowel sounds. Extremities: no cyanosis, clubbing, rash, tr edema warm  Neuro: will arouse and follow commands   Telemetry    Sinus tach 110 Personally reviewed   EKG    Per HPI  Labs   Basic Metabolic Panel: Recent Labs  Lab 10/11/2020 2024 10/03/2020 2247  NA 133* 133*  K 5.1 4.7  CL 92*  --   CO2 11*  --   GLUCOSE 180*  --   BUN 47*  --   CREATININE 6.80*  --   CALCIUM 9.0  --     Liver Function Tests: Recent Labs  Lab 10/12/2020 2024  AST 99*  ALT <5  ALKPHOS 123  BILITOT 2.1*  PROT 6.6  ALBUMIN 3.8   Recent Labs  Lab 09/16/2020 2024  LIPASE 38   No results for input(s): AMMONIA in the last 168 hours.  CBC: Recent Labs  Lab 09/29/2020 2024 09/18/2020 2247  WBC 7.6  --   NEUTROABS 6.2  --   HGB 12.7* 13.3  HCT 38.9* 39.0  MCV 109.9*  --   PLT 239  --     Cardiac Enzymes: No results for input(s): CKTOTAL, CKMB, CKMBINDEX, TROPONINI in the last 168 hours.  BNP: BNP (last 3 results) No results for input(s): BNP in the last 8760 hours.  ProBNP (last 3 results) No results for input(s): PROBNP in the last 8760 hours.   CBG: Recent Labs  Lab 10/11/2020 1936 10/10/2020 2007 10/06/2020 2107  10/10/2020 05-15-48 09/20/2020 2304  GLUCAP 48* 190* 88 90 96    Coagulation Studies: Recent Labs    10/03/2020 05/16/2022  LABPROT 19.9*  INR 1.7*     Imaging   DG Chest Portable 1 View  Result Date: 09/14/2020 CLINICAL DATA:  Cough EXAM: PORTABLE CHEST 1 VIEW COMPARISON:  06/03/2020 FINDINGS: Post sternotomy changes. Cardiomegaly. Aortic atherosclerosis. Possible tiny left effusion. No pneumothorax IMPRESSION: Cardiomegaly with possible trace left effusion Electronically Signed   By: Donavan Foil M.D.   On: 09/19/2020 20:52     Medications:     Current Medications:   Infusions:  dextrose       Assessment/Plan   1. Acute on chronic systolic HF -> cardiogenic shock  - EF 40-45% in 9/20 due to ICM - Bedside echo EF 5-10% - hstrop 7k-> 14k - Cath 10/12/2020 with stable CAD -> suspect acute myo-pericarditis  - lactate > 11.0 - Swan numbers as above - Continue IABP. Add DBA 3  -  Will need CVVHD for volume removal - trend swan numbers, lactate and co-ox - Not candidate for advanced therapies/durable VAD so no real role for [place] as bridge. Overall prognosis poor.   2. Acute hypoxic respiratory failure - due to pulmonary edema. PCWP 44 at cath - will need CVVHD for volume removal   3. ESRD - on HD since 7/21 - will likely need CVVHD  4. Elevated hs troponin with CAD s/p CABG 16-May-2015 - Bedside echo EF 5-10% - hstrop 7k-> 14k - Cath 09/26/2020 with stable CAD -> suspect acute myo-pericarditis  - ASA/statin - no b-blocker with shock  5. DM2 - SSI. Otherwise per primary   CRITICAL CARE Performed by: Glori Bickers  Total critical care time: 55 minutes  Critical care time was exclusive of separately billable procedures and treating other patients.  Critical care was necessary to treat or prevent imminent or life-threatening deterioration.  Critical care was time spent personally by me (independent of midlevel providers or residents) on the following activities: development of treatment plan with patient and/or surrogate as well as nursing, discussions with consultants, evaluation of patient's response to treatment, examination of patient, obtaining history from patient or surrogate, ordering and performing treatments and interventions, ordering and review of laboratory studies, ordering and review of radiographic studies, pulse oximetry and re-evaluation of patient's condition.  Length of Stay: 0  Glori Bickers, MD  10/03/2020, 12:25 AM  Advanced Heart Failure Team Pager [phone] (M-F; 7a - 5p)  Please contact [facility] for night-coverage after hours (4p -7a ) and weekends on [email or web address]

## 2020-09-27 NOTE — Progress Notes (Signed)
*  PRELIMINARY RESULTS* Echocardiogram 2D Echocardiogram with Definity has been performed.  Luisa Hart RDCS 10/03/2020, 11:12 AM

## 2020-09-28 ENCOUNTER — Inpatient Hospital Stay (HOSPITAL_COMMUNITY): Payer: Medicare Other

## 2020-09-28 DIAGNOSIS — I5022 Chronic systolic (congestive) heart failure: Secondary | ICD-10-CM | POA: Diagnosis not present

## 2020-09-28 DIAGNOSIS — J9601 Acute respiratory failure with hypoxia: Secondary | ICD-10-CM | POA: Diagnosis not present

## 2020-09-28 DIAGNOSIS — E440 Moderate protein-calorie malnutrition: Secondary | ICD-10-CM | POA: Insufficient documentation

## 2020-09-28 DIAGNOSIS — R570 Cardiogenic shock: Secondary | ICD-10-CM | POA: Diagnosis not present

## 2020-09-28 LAB — COOXEMETRY PANEL
Carboxyhemoglobin: 0.9 % (ref 0.5–1.5)
Carboxyhemoglobin: 0.9 % (ref 0.5–1.5)
Methemoglobin: 1 % (ref 0.0–1.5)
Methemoglobin: 0.9 % (ref 0.0–1.5)
O2 Saturation: 60.6 %
O2 Saturation: 65.5 %
Total hemoglobin: 12.5 g/dL (ref 12.0–16.0)
Total hemoglobin: 13.1 g/dL (ref 12.0–16.0)

## 2020-09-28 LAB — RENAL FUNCTION PANEL
Albumin: 3.5 g/dL (ref 3.5–5.0)
Albumin: 3.5 g/dL (ref 3.5–5.0)
Anion gap: 11 (ref 5–15)
Anion gap: 11 (ref 5–15)
BUN: 41 mg/dL — ABNORMAL HIGH (ref 8–23)
BUN: 32 mg/dL — ABNORMAL HIGH (ref 8–23)
CO2: 25 mmol/L (ref 22–32)
CO2: 26 mmol/L (ref 22–32)
Calcium: 8.8 mg/dL — ABNORMAL LOW (ref 8.9–10.3)
Calcium: 9 mg/dL (ref 8.9–10.3)
Chloride: 99 mmol/L (ref 98–111)
Chloride: 98 mmol/L (ref 98–111)
Creatinine, Ser: 4.74 mg/dL — ABNORMAL HIGH (ref 0.61–1.24)
Creatinine, Ser: 3.74 mg/dL — ABNORMAL HIGH (ref 0.61–1.24)
GFR, Estimated: 12 mL/min — ABNORMAL LOW (ref >60)
GFR, Estimated: 16 mL/min — ABNORMAL LOW (ref >60)
Glucose, Bld: 185 mg/dL — ABNORMAL HIGH (ref 70–99)
Glucose, Bld: 190 mg/dL — ABNORMAL HIGH (ref 70–99)
Phosphorus: 4.3 mg/dL (ref 2.5–4.6)
Phosphorus: 3.9 mg/dL (ref 2.5–4.6)
Potassium: 4.4 mmol/L (ref 3.5–5.1)
Potassium: 4.5 mmol/L (ref 3.5–5.1)
Sodium: 135 mmol/L (ref 135–145)
Sodium: 135 mmol/L (ref 135–145)

## 2020-09-28 LAB — GLUCOSE, CAPILLARY
Glucose-Capillary: 178 mg/dL — ABNORMAL HIGH (ref 70–99)
Glucose-Capillary: 196 mg/dL — ABNORMAL HIGH (ref 70–99)
Glucose-Capillary: 197 mg/dL — ABNORMAL HIGH (ref 70–99)
Glucose-Capillary: 208 mg/dL — ABNORMAL HIGH (ref 70–99)
Glucose-Capillary: 222 mg/dL — ABNORMAL HIGH (ref 70–99)
Glucose-Capillary: 251 mg/dL — ABNORMAL HIGH (ref 70–99)

## 2020-09-28 LAB — POCT I-STAT 7, (LYTES, BLD GAS, ICA,H+H)
Acid-Base Excess: 1 mmol/L (ref 0.0–2.0)
Bicarbonate: 26 mmol/L (ref 20.0–28.0)
Calcium, Ion: 1.13 mmol/L — ABNORMAL LOW (ref 1.15–1.40)
HCT: 41 % (ref 39.0–52.0)
Hemoglobin: 13.9 g/dL (ref 13.0–17.0)
O2 Saturation: 97 %
Patient temperature: 37.2
Potassium: 4.7 mmol/L (ref 3.5–5.1)
Sodium: 138 mmol/L (ref 135–145)
TCO2: 27 mmol/L (ref 22–32)
pCO2 arterial: 42.5 mmHg (ref 32.0–48.0)
pH, Arterial: 7.395 (ref 7.350–7.450)
pO2, Arterial: 94 mmHg (ref 83.0–108.0)

## 2020-09-28 LAB — LACTIC ACID, PLASMA
Lactic Acid, Venous: 1.3 mmol/L (ref 0.5–1.9)
Lactic Acid, Venous: 1.5 mmol/L (ref 0.5–1.9)

## 2020-09-28 LAB — HEPARIN LEVEL (UNFRACTIONATED): Heparin Unfractionated: 0.37 IU/mL (ref 0.30–0.70)

## 2020-09-28 LAB — MAGNESIUM: Magnesium: 2.5 mg/dL — ABNORMAL HIGH (ref 1.7–2.4)

## 2020-09-28 MED ORDER — LIDOCAINE-EPINEPHRINE 1 %-1:100000 IJ SOLN
10.0000 mL | Freq: Once | INTRAMUSCULAR | Status: AC
  Administered 2020-09-28: 10 mL
  Filled 2020-09-28: qty 1

## 2020-09-28 MED ORDER — AMIODARONE HCL IN DEXTROSE 360-4.14 MG/200ML-% IV SOLN
60.0000 mg/h | INTRAVENOUS | Status: AC
  Administered 2020-09-28 (×2): 60 mg/h via INTRAVENOUS
  Filled 2020-09-28: qty 400

## 2020-09-28 MED ORDER — AMIODARONE LOAD VIA INFUSION
150.0000 mg | Freq: Once | INTRAVENOUS | Status: AC
Start: 2020-09-28 — End: 2020-09-28
  Administered 2020-09-28: 150 mg via INTRAVENOUS
  Filled 2020-09-28: qty 83.34

## 2020-09-28 MED ORDER — VITAL 1.5 CAL PO LIQD
1000.0000 mL | ORAL | Status: DC
  Administered 2020-09-28 – 2020-09-29 (×2): 1000 mL

## 2020-09-28 MED ORDER — AMIODARONE HCL IN DEXTROSE 360-4.14 MG/200ML-% IV SOLN
30.0000 mg/h | INTRAVENOUS | Status: DC
Start: 2020-09-28 — End: 2020-09-30
  Administered 2020-09-28 – 2020-09-30 (×4): 30 mg/h via INTRAVENOUS
  Filled 2020-09-28 (×5): qty 200

## 2020-09-28 MED ORDER — VITAL 1.5 CAL PO LIQD
1000.0000 mL | ORAL | Status: DC
  Administered 2020-09-28: 1000 mL

## 2020-09-28 MED FILL — Heparin Sod (Porcine)-NaCl IV Soln 1000 Unit/500ML-0.9%: INTRAVENOUS | Qty: 500 | Status: AC

## 2020-09-28 MED FILL — Norepinephrine-NaCl IV Solution 4 MG/250ML-0.9%: INTRAVENOUS | Qty: 250 | Status: AC

## 2020-09-28 NOTE — Progress Notes (Signed)
NAME:  Henry Carroll, MRN:  [id number], DOB:  1950/06/30, LOS: 1 ADMISSION DATE:  09/29/2020, CONSULTATION DATE:  8/16 REFERRING MD:  Dr. Haroldine Laws , CHIEF COMPLAINT:  cardiogenic shock   History of Present Illness:  Patient is encephalopathic and/or intubated. Therefore history has been obtained from chart review.  70 year old male with past medical history as below, which is significant for end-stage renal disease on dialysis with most recent dialysis 8/15, coronary artery disease status post four-vessel CABG, heart failure with reduced ejection fraction, asthma, diabetes mellitus, and obstructive sleep apnea. He presented to [facility] ED 8/15 with complaints of chest pain x 2 days. In the ED he was noted to be hypotensive with cool extremities concerning for cardiogenic shock. Bedside echo showed severely decreased ejection fraction. Troponin was elevated. He was transferred to [facility] for possible invasive cardiac evaluation.   Upon arrival to [facility] he was taken immediately to the cath lab for NSTEMI. He was not able to tolerate the supine position and required intubation. Grafts were felt to be patent, but EF noted to be around 5-10%. Balloon pump placed. He did not require pressors during the case. Post procedurally he was taken to the ICU for ongoing care.   Pertinent  Medical History   has a past medical history of Abnormal stress test (02/10/2015), Acute on chronic systolic and diastolic heart failure, NYHA class 1 ([place]) (12/31/2014), Anemia, Anemia in chronic kidney disease (09/03/2012), Arthritis, Asthma, Asthma, chronic (06/04/2012), Asthma, chronic, mild intermittent, with acute exacerbation (12/22/2015), Bilateral lower extremity edema (12/22/2015), CAD (coronary artery disease) (02/18/2015), Chronic combined systolic (congestive) and diastolic (congestive) heart failure ([place]), Chronic combined systolic and diastolic CHF (congestive heart failure) ([place]) (02/10/2015),  Chronic systolic heart failure ([place]) (02/25/2015), CKD (chronic kidney disease), stage III ([place]), CKD (chronic kidney disease), stage V ([place]) (08/30/2018), COLONIC POLYPS, HX OF (04/13/2009), Coronary artery disease, Demand ischemia ([place]) (12/31/2014), DM type 2, uncontrolled, with renal complications ([place]) (7/34/1937), Dyspnea, Elevated troponin, Essential hypertension (10/15/4095), Folliculitis of perineum (10/01/2012), Gastric erosion, GERD (gastroesophageal reflux disease), History of blood transfusion, Hyperlipidemia, Hyperlipidemia with target LDL less than 100 (12/31/2014), Hypertension, Hypertensive heart disease with heart failure ([place]) (02/25/2015), IDA (iron deficiency anemia) (01/23/2016), Melena (01/23/2016), 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), Mitral regurgitation, Morbid obesity ([place]) (12/29/2014), Myocardial infarction [facility]), NSVT (nonsustained ventricular tachycardia) ([place]), Nuclear sclerotic cataract of left eye (05/26/2019), Nuclear sclerotic cataract of right eye (05/26/2019), Obesity, Obesity (BMI 30-39.9) (05/05/2013), OSA (obstructive sleep apnea) (01/16/2018), Other malaise and fatigue (09/03/2012), Other testicular hypofunction (09/03/2012), PAD (peripheral artery disease) ([place]) (03/02/2015), Pneumonia, Sleep apnea, Small bowel lesion, Symptomatic anemia (08/29/2018), 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  Significant Hospital Events: Including procedures, antibiotic start and stop dates in addition to other pertinent events   8/16 admit to [facility].  Intubated.  Right femoral PAC placed. IV heparin started.IABP placed. Inotrope support. HD cath placed for CRRT 8/17 oozing from HD cath insertion site, injected subq with lido+epi  Interim History / Subjective:   Remains sedated, intubated on mechanical life support, CVVHD with intra-aortic balloon  pump.  Objective   Blood pressure (!) 132/59, pulse (!) 117, temperature 97.9 F (36.6 C), resp. rate 16, height 6\' 2"  (1.88 m), weight 89.7 kg, SpO2 100 %. PAP: (26-41)/(21-34) 33/24 CVP:  [5 mmHg-20 mmHg] 7 mmHg CO:  [3.3 L/min-4.8 L/min] 3.3 L/min CI:  [1.5 L/min/m2-2.2 L/min/m2] 1.5 L/min/m2  Vent Mode: PRVC  FiO2 (%):  [30 %-40 %] 30 % Set Rate:  [16 bmp-18 bmp] 16 bmp Vt Set:  [570 mL-650 mL] 570 mL PEEP:  [5 cmH20] 5 cmH20 Plateau Pressure:  [15 cmH20-24 cmH20] 18 cmH20   Intake/Output Summary (Last 24 hours) at 09/28/2020 0913 Last data filed at 09/28/2020 0900 Gross per 24 hour  Intake 1861.25 ml  Output 4579 ml  Net -2717.75 ml   Filed Weights   10/05/2020 1914 09/24/2020 0400 09/28/20 0500  Weight: 91 kg 90.7 kg 89.7 kg    General: 70 year old gentleman intubated on mechanical life support critically ill HEENT NCAT, endotracheal tube in place, catheters in place, oozing from HD Pulmonary: Clear to auscultation bilaterally Cardiac: Distant heart tones, regular rate rhythm S1-S2 Abdomen: Soft, nontender nondistended GU: Anuric Extremities: Warm and dry, left AV fistula Neuro: [facility] Problem list     Assessment & Plan:   Acute on chronic HFrEF with Cardiogenic shock: has been worked up by cardiology. It is not felt to be ischemic in nature. Viral myocarditis? EF at 5-10% by bedside echo. Balloon pump placed in cath lab.  -Co-Oximetry still low Plan: Remains on intra-aortic balloon pump CVVHD continued for volume management PAD guidelines for sedation Remains on telemetry On IV heparin with balloon pump.  Acute hypoxemic respiratory failure OSA Endotracheal tube in sinus position, PICC line in satisfactory position.  Progressive bilateral patchy airspace disease left greater than right.  Cannot exclude an element of edema.  Endotracheal tube 3 position.  Cardiomegaly vascular fullness but no pulmonary edema Plan Continue full mechanical vent  support PAD guideline for sedation Continue removal of volume with CVVHD VAP prophylaxis  NSTEMI:  likely demand ischemia for poor global perfusion Plan Per cardiology No additional management at this time  ESRD on HD (MWF) Hyponatremia: hypervolemic AGMA/lactic acidosis: lactic acid 11 on admission.  Remains elevated, LFTs are elevated which is likely due to hepatic congestion.  I wonder if this is resulting in lactate clearing slowly -Nephrology consulted Plan Continue CVVHD  Abnormal LFTs.  Suspect shock liver versus hepatic congestion Plan Trend LFTs  DM Plan SSI, goal CBGs 140-180  Best Practice (right click and "Reselect all SmartList Selections" daily)   Diet/type: NPO DVT prophylaxis: prophylactic heparin  GI prophylaxis: PPI Lines: Central line Foley:  N/A Code Status:  full code Last date of multidisciplinary goals of care discussion []   This patient is critically ill with multiple organ system failure; which, requires frequent high complexity decision making, assessment, support, evaluation, and titration of therapies. This was completed through the application of advanced monitoring technologies and extensive interpretation of multiple databases. During this encounter critical care time was devoted to patient care services described in this note for 32 minutes.  Garner Nash, DO [facility] 09/28/2020 9:18 AM

## 2020-09-28 NOTE — Plan of Care (Signed)
  Problem: Nutrition: Goal: Adequate nutrition will be maintained Outcome: Progressing   Problem: Elimination: Goal: Will not experience complications related to bowel motility Outcome: Progressing Note: Smear BM   Problem: Safety: Goal: Ability to remain free from injury will improve Outcome: Progressing   Problem: Skin Integrity: Goal: Risk for impaired skin integrity will decrease Outcome: Progressing   Problem: Safety: Goal: Non-violent Restraint(s) Outcome: Progressing Note: Restraints d/c'd this AM     Problem: Clinical Measurements: Goal: Respiratory complications will improve Outcome: Not Progressing Note: Period of desaturation into low 80's requiring increase in fio2 to 80% from 30%.  Goal: Cardiovascular complication will be avoided Outcome: Not Progressing Note: A flutter this AM requiring amio bolus followed by continuous infusion. Brief run of atrial fib this afternoon converting back to ST. Labile BP. Drop in CO/CI.   Problem: Cardiac: Goal: Ability to achieve and maintain adequate cardiopulmonary perfusion will improve Outcome: Not Progressing

## 2020-09-28 NOTE — Progress Notes (Signed)
ANTICOAGULATION CONSULT NOTE - Follow Up Consult  Pharmacy Consult for Heparin  Indication:  IABP  and afib/flutter  Not on File  Patient Measurements: Height: 6\' 2"  (188 cm) Weight: 89.7 kg (197 lb 12 oz) IBW/kg (Calculated) : 82.2  Vital Signs: Temp: 98.6 F (37 C) (08/17 1215) Temp Source: Core (08/17 1200) BP: 142/85 (08/17 1200) Pulse Rate: 110 (08/17 1215)  Labs: Recent Labs    10/06/2020 2024 09/17/2020 2129 09/29/2020 2247 10/06/2020 0325 09/24/2020 0451 10/11/2020 0542 10/09/2020 1226 09/22/2020 1552 09/12/2020 1944 09/22/2020 1950 09/20/2020 2144 09/28/20 0348 09/28/20 1137  HGB 12.7*  --    < > 10.9* 12.6*  --   --   --   --  12.2* 12.6*  --   --   HCT 38.9*  --    < > 35.2* 37.0*  --   --   --   --  36.0* 37.0*  --   --   PLT 239  --   --  184  --   --   --   --   --   --   --   --   --   LABPROT 19.9*  --   --   --   --   --   --   --   --   --   --   --   --   INR 1.7*  --   --   --   --   --   --   --   --   --   --   --   --   HEPARINUNFRC  --   --   --   --   --   --  0.15*  --  0.30  --   --   --  0.37  CREATININE 6.80*  --   --  7.38*  --   --   --  6.58*  --   --   --  4.74*  --   TROPONINIHS 6,552* 7,265*  --  93,235*  --  18,109*  --   --   --   --   --   --   --    < > = values in this interval not displayed.     Estimated Creatinine Clearance: 16.4 mL/min (A) (by C-G formula based on SCr of 4.74 mg/dL (H)).   Medical History: Past Medical History:  Diagnosis Date   Abnormal stress test 02/10/2015   Acute on chronic systolic and diastolic heart failure, NYHA class 1 ([place]) 12/31/2014   Anemia    Anemia in chronic kidney disease 09/03/2012   Arthritis    left  5th finger   Asthma    Asthma, chronic 06/04/2012   Asthma, chronic, mild intermittent, with acute exacerbation 12/22/2015   Bilateral lower extremity edema 12/22/2015   CAD (coronary artery disease) 02/18/2015   Chronic combined systolic (congestive) and diastolic (congestive) heart failure ([place])    a.  12/31/14: 2D ECHO: EF 40-45%, HK of inf myocardium, G1DD, mod MR   Chronic combined systolic and diastolic CHF (congestive heart failure) ([place]) 57/32/2025   Chronic systolic heart failure ([place]) 02/25/2015   CKD (chronic kidney disease), stage III (HCC)    stage 3 kidney disease   CKD (chronic kidney disease), stage V ([place]) 08/30/2018   COLONIC POLYPS, HX OF 04/13/2009   Qualifier: Diagnosis of  By: Westly Pam.    Coronary artery disease    a.  LHC 01/2015 - triple vessel CAD (mod oLM, mLAD, severe mRCA, intermediate branch stenosis, CTO of mCx). Plan CABG 02/2015.   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 Mild-mod by echo 12/2014.   Morbid obesity ([place]) 12/29/2014   Myocardial infarction [facility])    pt. states per [name] he has in the past   NSVT (nonsustained ventricular tachycardia) ([place])    a. 9 beats during 01/2015 adm. BB titrated.   Nuclear sclerotic cataract of left eye 05/26/2019   Nuclear sclerotic cataract of right eye 05/26/2019   Obesity    a. BMI 33   Obesity (BMI 30-39.9) 05/05/2013   OSA (obstructive sleep apnea) 01/16/2018   Mild obstructive sleep apnea overall with an AHI of 7.3/h and no significant central sleep apnea. Severe obstructive sleep apnea during REM sleep with an AHI of 34.3/h.  Now on CPAP at 6 cm H2O.    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 Pt was taken to cath where IABP was placed. Pt has ESRD on HD.  Heparin level at goal this morning on heparin at rate of 1000 units/hr + 500 uts/hr in CRRT circuit No bleeding or issues with line per RN. Hemoglobin stable in the 12s, plt normal.   Goal of Therapy:  Heparin level 0.3-0.5 units/ml Monitor platelets by anticoagulation protocol: Yes   Plan:  Continue heparin drip at 1000 units/hr And heparin 500 uts/h r in CRRT circuit  Daily CBC/heparin level Monitor for bleeding   Erin Hearing PharmD., BCPS Clinical Pharmacist 09/28/2020 12:54 PM

## 2020-09-28 NOTE — Progress Notes (Addendum)
eLink Physician-Brief Progress Note Patient Name: SLATON REASER DOB: 08-28-1950 MRN: [id number]   Date of Service  09/28/2020  HPI/Events of Note  Patient with transient twitchy extremity movements with spontaneous termination, otherwise neurological exam is unchanged from baseline per bedside RN.  eICU Interventions  EEG ordered, bedside RN instructed to notify me stat if it happens again so I can directly observe it, he has PRN Versed on his MAR for seizures. I there is a recurrence consideration will be given to imaging his brain.        Frederik Pear 09/28/2020, 9:43 PM

## 2020-09-28 NOTE — Progress Notes (Signed)
   Remains in A fib. On amio. Dobutamine 3 + norepi 8 mcg + IAPB 1:1   CI 1.1 CO 2.5   Increase dobutamine to 5 mcg and Norepi 10 mcg.   CO-OX 65%. Lactic Acid 1.5  Continue current plan. Discussed with Dr Haroldine Laws.   Tinya Cadogan NP_C   5:11 PM

## 2020-09-28 NOTE — Progress Notes (Signed)
[facility] Progress Note   Assessment/ Plan:   1.  Cardiogenic shock: Acute exacerbation of congestive heart failure with reduced ejection fraction and suspected to be viral myocarditis.  With intra-aortic balloon pump in situ and will begin CRRT for extracorporeal volume unloading while following CVP/volume assessment--> on intracircuit heparin, 4K bath, continue volume removal 2.  End-stage renal disease: Usually on Monday/Wednesday/Friday schedule and underwent hemodialysis yesterday however, elevated central pressures on right heart catheterization prompting need for extracorporeal volume unloading in the setting of cardiogenic shock. 3.  Hyperkalemia: Mild, monitor with CRRT. 4.  Anion gap metabolic acidosis/lactic acidosis: Anticipate to correct with CRRT 5.  Anemia of chronic kidney disease: Hemoglobin and hematocrit currently acceptable, will continue to follow trend to decide on need for supplementation. 6.  Chronic kidney disease-metabolic bone disease: Currently n.p.o. while intubated, monitor off binders anticipating electrolyte depletion with CRRT.  Subjective:    Seen in room--> still on CRRT and IABP.  Tolerating fluid removal.  Dtr updated at bedside.     Objective:   BP (!) 119/106   Pulse (!) 111   Temp 97.7 F (36.5 C)   Resp 16   Ht 6' 2" (1.88 m)   Wt 89.7 kg   SpO2 98%   BMI 25.39 kg/m   Physical Exam: Gen: intubated, sedated CVS: tachycardic, IABP in place Resp: coarse mech bilaterally Abd: soft Ext: trace LE edema ACCESS: L IJ trialysis catheter in place, L AVF + T/B  Labs: BMET Recent Labs  Lab 10/09/2020 2024 09/25/2020 2247 10/12/2020 0201 09/14/2020 0325 10/02/2020 0451 09/15/2020 1552 09/29/2020 1950 09/18/2020 2144 09/28/20 0348  NA 133*   < > 133* 137 133* 138 136 138 135  K 5.1   < > 5.7* 5.3* 4.5 4.0 3.8 4.0 4.4  CL 92*  --   --  91*  --  97*  --   --  99  CO2 11*  --   --  14*  --  24  --   --  25  GLUCOSE 180*  --   --  62*  --   149*  --   --  185*  BUN 47*  --   --  48*  --  53*  --   --  41*  CREATININE 6.80*  --   --  7.38*  --  6.58*  --   --  4.74*  CALCIUM 9.0  --   --  8.7*  --  8.6*  --   --  8.8*  PHOS  --   --   --  11.6*  --  6.9*  --   --  4.3   < > = values in this interval not displayed.   CBC Recent Labs  Lab 09/25/2020 2024 10/07/2020 2247 09/28/2020 0325 10/11/2020 0451 09/16/2020 1950 09/25/2020 2144  WBC 7.6  --  8.6  --   --   --   NEUTROABS 6.2  --   --   --   --   --   HGB 12.7*   < > 10.9* 12.6* 12.2* 12.6*  HCT 38.9*   < > 35.2* 37.0* 36.0* 37.0*  MCV 109.9*  --  113.9*  --   --   --   PLT 239  --  184  --   --   --    < > = values in this interval not displayed.      Medications:     aspirin  81 mg  Per Tube Daily   B-complex with vitamin C  1 tablet Per Tube Daily   chlorhexidine gluconate (MEDLINE KIT)  15 mL Mouth Rinse BID   Chlorhexidine Gluconate Cloth  6 each Topical Daily   docusate  100 mg Per Tube BID   feeding supplement (PROSource TF)  45 mL Per Tube QID   insulin aspart  0-6 Units Subcutaneous Q4H   mouth rinse  15 mL Mouth Rinse 10 times per day   pantoprazole sodium  40 mg Per Tube Daily   polyethylene glycol  17 g Per Tube Daily   sodium chloride flush  10-40 mL Intracatheter Q12H   sodium chloride flush  3 mL Intravenous Q12H     Madelon Lips, MD 09/28/2020, 11:42 AM

## 2020-09-28 NOTE — Progress Notes (Addendum)
Advanced Heart Failure Rounding Note  PCP-Cardiologist: Candee Furbish, MD   Subjective:   Intubated FiO2 40% on CVVHD. Pulling 150 per hour.   Remains on dobutamine 3 mcg + norepi 5 mcg.   IABP 1:1  CO-OX 61%  Swan#s  CVP 8  PA 31/26 (28)  CI 1.3 CO 3.3    Objective:   Weight Range: 89.7 kg Body mass index is 25.39 kg/m.   Vital Signs:   Temp:  [95.7 F (35.4 C)-99.3 F (37.4 C)] 98.4 F (36.9 C) (08/17 0600) Pulse Rate:  [106-123] 122 (08/17 0600) Resp:  [15-21] 21 (08/17 0600) BP: (94-151)/(55-106) 151/81 (08/17 0700) SpO2:  [100 %] 100 % (08/17 0600) Arterial Line BP: (82-150)/(31-58) 101/58 (08/17 0600) FiO2 (%):  [35 %-40 %] 35 % (08/17 0400) Weight:  [89.7 kg] 89.7 kg (08/17 0500) Last BM Date:  (PTA)  Weight change: Filed Weights   09/14/2020 1914 09/15/2020 0400 09/28/20 0500  Weight: 91 kg 90.7 kg 89.7 kg    Intake/Output:   Intake/Output Summary (Last 24 hours) at 09/28/2020 0708 Last data filed at 09/28/2020 0700 Gross per 24 hour  Intake 1653.06 ml  Output 4164 ml  Net -2510.94 ml      Physical Exam    General:Intubated on CVVHD  HEENT: ETT Neck: Supple. JVP difficult to assess with lines . Carotids 2+ bilat; no bruits. No lymphadenopathy or thyromegaly appreciated. LIJ HD catheter Cor: PMI nondisplaced. Tachy regular rate & rhythm. No rubs, gallops or murmurs. Lungs: Clear Abdomen: Soft, nontender, nondistended. No hepatosplenomegaly. No bruits or masses. Good bowel sounds. Extremities: No cyanosis, clubbing, rash, edema. R groin IABP  Neuro: intubated/sedated  Telemetry  A flutter  120s   EKG    A flutter RVR 121 2:1   Labs    CBC Recent Labs    09/29/2020 2024 09/14/2020 2247 10/05/2020 0325 10/04/2020 0451 10/05/2020 1950 09/25/2020 2144  WBC 7.6  --  8.6  --   --   --   NEUTROABS 6.2  --   --   --   --   --   HGB 12.7*   < > 10.9*   < > 12.2* 12.6*  HCT 38.9*   < > 35.2*   < > 36.0* 37.0*  MCV 109.9*  --  113.9*  --   --   --    PLT 239  --  184  --   --   --    < > = values in this interval not displayed.   Basic Metabolic Panel Recent Labs    09/21/2020 0325 09/21/2020 0451 09/28/2020 1552 10/05/2020 1950 09/15/2020 2144 09/28/20 0348  NA 137   < > 138   < > 138 135  K 5.3*   < > 4.0   < > 4.0 4.4  CL 91*  --  97*  --   --  99  CO2 14*  --  24  --   --  25  GLUCOSE 62*  --  149*  --   --  185*  BUN 48*  --  53*  --   --  41*  CREATININE 7.38*  --  6.58*  --   --  4.74*  CALCIUM 8.7*  --  8.6*  --   --  8.8*  MG 2.4  --   --   --   --  2.5*  PHOS 11.6*  --  6.9*  --   --  4.3   < > =  values in this interval not displayed.   Liver Function Tests Recent Labs    09/29/2020 2024 09/15/2020 0325 09/21/2020 1552 09/28/20 0348  AST 99* 314*  --   --   ALT <5 211*  --   --   ALKPHOS 123 101  --   --   BILITOT 2.1* 2.4*  --   --   PROT 6.6 5.6*  --   --   ALBUMIN 3.8 3.3* 3.4* 3.5   Recent Labs    09/25/2020 2024  LIPASE 38   Cardiac Enzymes No results for input(s): CKTOTAL, CKMB, CKMBINDEX, TROPONINI in the last 72 hours.  BNP: BNP (last 3 results) No results for input(s): BNP in the last 8760 hours.  ProBNP (last 3 results) No results for input(s): PROBNP in the last 8760 hours.   D-Dimer No results for input(s): DDIMER in the last 72 hours. Hemoglobin A1C No results for input(s): HGBA1C in the last 72 hours. Fasting Lipid Panel No results for input(s): CHOL, HDL, LDLCALC, TRIG, CHOLHDL, LDLDIRECT in the last 72 hours. Thyroid Function Tests No results for input(s): TSH, T4TOTAL, T3FREE, THYROIDAB in the last 72 hours.  Invalid input(s): FREET3  Other results:   Imaging    DG CHEST PORT 1 VIEW  Result Date: 09/16/2020 CLINICAL DATA:  Central line placement EXAM: PORTABLE CHEST 1 VIEW COMPARISON:  Earlier same day FINDINGS: New left IJ central line tip overlies SVC. No pneumothorax. Endotracheal tube, partially imaged enteric tube, and femoral approach Swan-Ganz catheter are unchanged. Low  lung volumes. No new consolidation or edema. No pleural effusion. Stable cardiomediastinal contours. IMPRESSION: New left IJ central line tip overlies SVC. No pneumothorax. Otherwise stable lines and tubes. Electronically Signed   By: Macy Mis M.D.   On: 09/25/2020 09:40   ECHOCARDIOGRAM COMPLETE  Result Date: 09/29/2020    ECHOCARDIOGRAM REPORT   Patient Name:   Henry Carroll Date of Exam: 09/15/2020 Medical Rec #:  [id number]           Height:       74.0 in Accession #:    [id number]          Weight:       200.0 lb Date of Birth:  [birth year]           BSA:          2.173 m Patient Age:    70 years            BP:           117/59 mmHg Patient Gender: M                   HR:           115 bpm. Exam Location:  Inpatient Procedure: 2D Echo, Cardiac Doppler, Color Doppler and Intracardiac            Opacification Agent Indications:    Abnormal EKG  History:        Patient has prior history of Echocardiogram examinations, most                 recent 11/03/2018. CHF, CAD, COPD, Arrythmias:NSVT,                 Signs/Symptoms:Murmur; Risk Factors:Dyslipidemia, Diabetes and                 Hypertension. 02/09/15 Left heart cath  02/18/2015 CABG x 4.  Sonographer:    Luisa Hart RDCS Referring Phys: 21 JAYADEEP S VARANASI  Sonographer Comments: Echo performed with patient supine and on artificial respirator. IMPRESSIONS  1. Left ventricular ejection fraction, by estimation, is 20 to 25%. The left ventricle has severely decreased function. The left ventricle demonstrates global hypokinesis. The left ventricular internal cavity size was moderately dilated. Left ventricular diastolic parameters are consistent with Grade I diastolic dysfunction (impaired relaxation).  2. Right ventricular systolic function is normal. The right ventricular size is normal. There is moderately elevated pulmonary artery systolic pressure.  3. Left atrial size was moderately dilated.  4. The pericardial effusion is  posterior to the left ventricle.  5. The mitral valve is abnormal. Mild mitral valve regurgitation. No evidence of mitral stenosis. Severe mitral annular calcification.  6. The aortic valve is calcified. There is moderate calcification of the aortic valve. There is moderate thickening of the aortic valve. Aortic valve regurgitation is mild. Sclerosis without stenosis.  7. The inferior vena cava is normal in size with greater than 50% respiratory variability, suggesting right atrial pressure of 3 mmHg. FINDINGS  Left Ventricle: Left ventricular ejection fraction, by estimation, is 20 to 25%. The left ventricle has severely decreased function. The left ventricle demonstrates global hypokinesis. Definity contrast agent was given IV to delineate the left ventricular endocardial borders. The left ventricular internal cavity size was moderately dilated. There is no left ventricular hypertrophy. Left ventricular diastolic parameters are consistent with Grade I diastolic dysfunction (impaired relaxation). Right Ventricle: The right ventricular size is normal. No increase in right ventricular wall thickness. Right ventricular systolic function is normal. There is moderately elevated pulmonary artery systolic pressure. The tricuspid regurgitant velocity is 2.76 m/s, and with an assumed right atrial pressure of 15 mmHg, the estimated right ventricular systolic pressure is 35.7 mmHg. Left Atrium: Left atrial size was moderately dilated. Right Atrium: Right atrial size was normal in size. Pericardium: Trivial pericardial effusion is present. The pericardial effusion is posterior to the left ventricle. Mitral Valve: The mitral valve is abnormal. There is moderate thickening of the mitral valve leaflet(s). There is moderate calcification of the mitral valve leaflet(s). Severe mitral annular calcification. Mild mitral valve regurgitation. No evidence of mitral valve stenosis. Tricuspid Valve: The tricuspid valve is normal in  structure. Tricuspid valve regurgitation is mild . No evidence of tricuspid stenosis. Aortic Valve: The aortic valve is calcified. There is moderate calcification of the aortic valve. There is moderate thickening of the aortic valve. Aortic valve regurgitation is mild. Sclerosis without stenosis. Aortic valve mean gradient measures 4.0 mmHg. Aortic valve peak gradient measures 7.8 mmHg. Aortic valve area, by VTI measures 1.39 cm. Pulmonic Valve: The pulmonic valve was normal in structure. Pulmonic valve regurgitation is not visualized. No evidence of pulmonic stenosis. Aorta: The aortic root is normal in size and structure. Venous: The inferior vena cava is normal in size with greater than 50% respiratory variability, suggesting right atrial pressure of 3 mmHg. IAS/Shunts: No atrial level shunt detected by color flow Doppler.  LEFT VENTRICLE PLAX 2D LVIDd:         5.90 cm      Diastology LVIDs:         5.50 cm      LV e' medial:   2.85 cm/s LV PW:         0.90 cm      LV E/e' medial: 12.4 LV IVS:        1.10  cm LVOT diam:     1.80 cm LV SV:         25 LV SV Index:   12 LVOT Area:     2.54 cm  LV Volumes (MOD) LV vol d, MOD A2C: 185.0 ml LV vol d, MOD A4C: 160.0 ml LV vol s, MOD A2C: 149.0 ml LV vol s, MOD A4C: 116.0 ml LV SV MOD A2C:     36.0 ml LV SV MOD A4C:     160.0 ml LV SV MOD BP:      42.7 ml RIGHT VENTRICLE RV Basal diam:  4.20 cm RV Mid diam:    2.20 cm RV S prime:     7.80 cm/s TAPSE (M-mode): 0.7 cm LEFT ATRIUM            Index       RIGHT ATRIUM           Index LA diam:      4.30 cm  1.98 cm/m  RA Area:     24.10 cm LA Vol (A2C): 104.0 ml 47.85 ml/m RA Volume:   78.00 ml  35.89 ml/m  AORTIC VALVE                   PULMONIC VALVE AV Area (Vmax):    1.50 cm    PV Vmax:       0.88 m/s AV Area (Vmean):   1.41 cm    PV Vmean:      61.100 cm/s AV Area (VTI):     1.39 cm    PV VTI:        0.119 m AV Vmax:           140.00 cm/s PV Peak grad:  3.1 mmHg AV Vmean:          95.000 cm/s PV Mean grad:  2.0  mmHg AV VTI:            0.182 m AV Peak Grad:      7.8 mmHg AV Mean Grad:      4.0 mmHg LVOT Vmax:         82.60 cm/s LVOT Vmean:        52.600 cm/s LVOT VTI:          0.100 m LVOT/AV VTI ratio: 0.55 AR Vena Contracta: 0.20 cm  AORTA Ao Root diam: 3.60 cm Ao Asc diam:  3.50 cm MITRAL VALVE                 TRICUSPID VALVE MV Area (PHT): 7.37 cm      TR Peak grad:   30.5 mmHg MV Decel Time: 103 msec      TR Vmax:        276.00 cm/s MR Peak grad:    72.9 mmHg MR Mean grad:    42.0 mmHg   SHUNTS MR Vmax:         427.00 cm/s Systemic VTI:  0.10 m MR Vmean:        298.0 cm/s  Systemic Diam: 1.80 cm MR PISA:         1.01 cm MR PISA Eff ROA: 5 mm MR PISA Radius:  0.40 cm MV E velocity: 35.20 cm/s MV A velocity: 114.00 cm/s MV E/A ratio:  0.31 Jenkins Rouge MD Electronically signed by Jenkins Rouge MD Signature Date/Time: 10/08/2020/11:36:29 AM    Final      Medications:     Scheduled Medications:  aspirin  81 mg Per Tube  Daily   B-complex with vitamin C  1 tablet Per Tube Daily   chlorhexidine gluconate (MEDLINE KIT)  15 mL Mouth Rinse BID   Chlorhexidine Gluconate Cloth  6 each Topical Daily   docusate  100 mg Per Tube BID   feeding supplement (PROSource TF)  45 mL Per Tube QID   insulin aspart  0-6 Units Subcutaneous Q4H   mouth rinse  15 mL Mouth Rinse 10 times per day   pantoprazole sodium  40 mg Per Tube Daily   polyethylene glycol  17 g Per Tube Daily   sodium chloride flush  10-40 mL Intracatheter Q12H   sodium chloride flush  3 mL Intravenous Q12H    Infusions:   prismasol BGK 4/2.5 400 mL/hr at 09/28/20 0053    prismasol BGK 4/2.5 200 mL/hr at 09/15/2020 1149   sodium chloride 5 mL/hr at 09/28/20 0700   dexmedetomidine (PRECEDEX) IV infusion 0.6 mcg/kg/hr (09/28/20 0700)   DOBUTamine 3 mcg/kg/min (09/28/20 0700)   feeding supplement (VITAL 1.5 CAL) 20 mL/hr at 09/28/20 0000   fentaNYL infusion INTRAVENOUS 100 mcg/hr (09/28/20 0700)   heparin 10,000 units/ 20 mL infusion syringe 500  Units/hr (09/28/20 0608)   heparin 1,000 Units/hr (09/28/20 0700)   norepinephrine (LEVOPHED) Adult infusion 5 mcg/min (09/28/20 0700)   prismasol BGK 4/2.5 1,500 mL/hr at 09/28/20 0200    PRN Medications: sodium chloride, acetaminophen, dextrose, fentaNYL (SUBLIMAZE) injection, fentaNYL (SUBLIMAZE) injection, heparin, heparin, midazolam, midazolam, ondansetron (ZOFRAN) IV, sodium chloride flush, sodium chloride flush    Patient Profile   70 y/o male with HTN,. DM2, OSA, CAD s/p CABG 1572, chronic systolic HF (EF 62-03% in 9/20), ESRD (started HD in 7/21).   Admitted cardiogenic shock- Angiography with stable CAD with 3/4 grafts patent. Felt to have myo-pericarditis. IABP+ pressors+ vent +CVVHD  Assessment/Plan  1. Acute on chronic systolic HF -> cardiogenic shock  - EF 40-45% in 9/20 due to ICM - Bedside echo EF 5-10% - hstrop 7k-> 14k - Cath 10/10/2020 with stable CAD -> suspect acute myo-pericarditis  - lactate > 11.0-->3.9 - Hemodynamics worse with A flutter RVR. Add amio drip.  - Continue IABP. Continue DBA 3  + Norepi 5.  - Will need CVVHD for volume removal - - Not candidate for advanced therapies/durable VAD so no real role for Impella as bridge. Overall prognosis poor.    2. Acute hypoxic respiratory failure - due to pulmonary edema. PCWP 44 at cath - Continue CVVHD for volume removal    3. ESRD - on HD since 7/21 - Continues on CVVHD. Nephrology appreciated.    4. Elevated hs troponin with CAD s/p CABG 2017 - Bedside echo EF 5-10% - hstrop 7k-> 14k - Cath 09/23/2020 with stable CAD -> suspect acute myo-pericarditis  - ASA/statin - no b-blocker with shock   5. DM2 - SSI. Otherwise per primary   6. A flutterr RVR -Start amio drip now.  -Continue heparin drip.     Length of Stay: 1  Amy Clegg, NP  09/28/2020, 7:08 AM  Advanced Heart Failure Team Pager [phone] (M-F; 7a - 5p)  Please contact [facility] for night-coverage after hours (5p -7a ) and  weekends on [email or web address]  Agree with above.   Remains intubated. Sedated but will arouse. On DBA 3 and NE 5 and IABP. SBP ok. Co-ox 61% However outputs remain low on thermo. Lactate 11 -> 3.9   Remains tachy. Tolerating CVVHD well.   General:  Intubated sedated HEENT: normal Neck: supple. +  HD cath. Carotids 2+ bilat; no bruits. No lymphadenopathy or thryomegaly appreciated. Cor: PMI nondisplaced. Tachy irregular No rubs, gallops or murmurs. Lungs: clear Abdomen: soft, nontender, nondistended. No hepatosplenomegaly. No bruits or masses. Good bowel sounds. Extremities: no cyanosis, clubbing, rash, edema warm. +RFA IABP Neuro: sedated on vent but will arouse  Lactic acidosis resolving with IABP/pressor support and CVVHD. Appears warm and well perfused on exam with pressor/IABP support. Co-ox 61% but thermo lower.   ECG is AFL/atach this am (probably present on admit). Will start IV amio. May need TEE/DC-CV if doesn't convert. Start heparin.   Suspect he has myopericarditis. Will continue current support for now. Can increase DBA as needed. If deteriorates will ned to consider if he is candidate for Impella support as a bridge to recovery only. Given age and ESRD - may not be a candidate.   CRITICAL CARE Performed by: Glori Bickers  Total critical care time: 45 minutes  Critical care time was exclusive of separately billable procedures and treating other patients.  Critical care was necessary to treat or prevent imminent or life-threatening deterioration.  Critical care was time spent personally by me (independent of midlevel providers or residents) on the following activities: development of treatment plan with patient and/or surrogate as well as nursing, discussions with consultants, evaluation of patient's response to treatment, examination of patient, obtaining history from patient or surrogate, ordering and performing treatments and interventions, ordering and review of laboratory  studies, ordering and review of radiographic studies, pulse oximetry and re-evaluation of patient's condition.  Glori Bickers, MD  8:01 AM

## 2020-09-28 NOTE — Progress Notes (Signed)
eLink Physician-Brief Progress Note Patient Name: Henry Carroll DOB: 1950/04/25 MRN: [id number]   Date of Service  09/28/2020  HPI/Events of Note  I spoke to the neurologist on call tonight about patient's new onset twitching movements, and he will see patient in consultation tonight to determine if stat head CT and EEG are warranted.  eICU Interventions  Neurology consult ordered.        Kerry Kass Laycee Fitzsimmons 09/28/2020, 11:16 PM

## 2020-09-29 ENCOUNTER — Inpatient Hospital Stay (HOSPITAL_COMMUNITY): Payer: Medicare Other

## 2020-09-29 ENCOUNTER — Encounter (HOSPITAL_COMMUNITY): Payer: Self-pay | Admitting: Hematology

## 2020-09-29 DIAGNOSIS — G931 Anoxic brain damage, not elsewhere classified: Secondary | ICD-10-CM

## 2020-09-29 DIAGNOSIS — N186 End stage renal disease: Secondary | ICD-10-CM | POA: Diagnosis not present

## 2020-09-29 DIAGNOSIS — G253 Myoclonus: Secondary | ICD-10-CM

## 2020-09-29 DIAGNOSIS — R570 Cardiogenic shock: Secondary | ICD-10-CM | POA: Diagnosis not present

## 2020-09-29 DIAGNOSIS — R569 Unspecified convulsions: Secondary | ICD-10-CM | POA: Diagnosis not present

## 2020-09-29 DIAGNOSIS — I400 Infective myocarditis: Secondary | ICD-10-CM | POA: Diagnosis not present

## 2020-09-29 DIAGNOSIS — J9601 Acute respiratory failure with hypoxia: Secondary | ICD-10-CM | POA: Diagnosis not present

## 2020-09-29 LAB — GLUCOSE, CAPILLARY
Glucose-Capillary: 252 mg/dL — ABNORMAL HIGH (ref 70–99)
Glucose-Capillary: 321 mg/dL — ABNORMAL HIGH (ref 70–99)
Glucose-Capillary: 323 mg/dL — ABNORMAL HIGH (ref 70–99)
Glucose-Capillary: 278 mg/dL — ABNORMAL HIGH (ref 70–99)
Glucose-Capillary: 232 mg/dL — ABNORMAL HIGH (ref 70–99)
Glucose-Capillary: 269 mg/dL — ABNORMAL HIGH (ref 70–99)
Glucose-Capillary: 186 mg/dL — ABNORMAL HIGH (ref 70–99)

## 2020-09-29 LAB — CBC
HCT: 44.3 % (ref 39.0–52.0)
Hemoglobin: 14 g/dL (ref 13.0–17.0)
MCH: 34.7 pg — ABNORMAL HIGH (ref 26.0–34.0)
MCHC: 31.6 g/dL (ref 30.0–36.0)
MCV: 109.9 fL — ABNORMAL HIGH (ref 80.0–100.0)
Platelets: 156 10*3/uL (ref 150–400)
RBC: 4.03 MIL/uL — ABNORMAL LOW (ref 4.22–5.81)
RDW: 17.2 % — ABNORMAL HIGH (ref 11.5–15.5)
WBC: 11.5 10*3/uL — ABNORMAL HIGH (ref 4.0–10.5)
nRBC: 0.3 % — ABNORMAL HIGH (ref 0.0–0.2)

## 2020-09-29 LAB — RENAL FUNCTION PANEL
Albumin: 3.7 g/dL (ref 3.5–5.0)
Albumin: 3.1 g/dL — ABNORMAL LOW (ref 3.5–5.0)
Anion gap: 14 (ref 5–15)
Anion gap: 9 (ref 5–15)
BUN: 30 mg/dL — ABNORMAL HIGH (ref 8–23)
BUN: 35 mg/dL — ABNORMAL HIGH (ref 8–23)
CO2: 22 mmol/L (ref 22–32)
CO2: 22 mmol/L (ref 22–32)
Calcium: 9.3 mg/dL (ref 8.9–10.3)
Calcium: 8.5 mg/dL — ABNORMAL LOW (ref 8.9–10.3)
Chloride: 98 mmol/L (ref 98–111)
Chloride: 103 mmol/L (ref 98–111)
Creatinine, Ser: 2.99 mg/dL — ABNORMAL HIGH (ref 0.61–1.24)
Creatinine, Ser: 2.75 mg/dL — ABNORMAL HIGH (ref 0.61–1.24)
GFR, Estimated: 21 mL/min — ABNORMAL LOW (ref >60)
GFR, Estimated: 24 mL/min — ABNORMAL LOW (ref >60)
Glucose, Bld: 243 mg/dL — ABNORMAL HIGH (ref 70–99)
Glucose, Bld: 263 mg/dL — ABNORMAL HIGH (ref 70–99)
Phosphorus: 4 mg/dL (ref 2.5–4.6)
Phosphorus: 3.5 mg/dL (ref 2.5–4.6)
Potassium: 5.1 mmol/L (ref 3.5–5.1)
Potassium: 5.3 mmol/L — ABNORMAL HIGH (ref 3.5–5.1)
Sodium: 134 mmol/L — ABNORMAL LOW (ref 135–145)
Sodium: 134 mmol/L — ABNORMAL LOW (ref 135–145)

## 2020-09-29 LAB — HEPATIC FUNCTION PANEL
ALT: 1054 U/L — ABNORMAL HIGH (ref 0–44)
AST: 578 U/L — ABNORMAL HIGH (ref 15–41)
Albumin: 3.7 g/dL (ref 3.5–5.0)
Alkaline Phosphatase: 111 U/L (ref 38–126)
Bilirubin, Direct: 0.3 mg/dL — ABNORMAL HIGH (ref 0.0–0.2)
Indirect Bilirubin: 0.9 mg/dL (ref 0.3–0.9)
Total Bilirubin: 1.2 mg/dL (ref 0.3–1.2)
Total Protein: 7 g/dL (ref 6.5–8.1)

## 2020-09-29 LAB — POCT I-STAT 7, (LYTES, BLD GAS, ICA,H+H)
Acid-base deficit: 2 mmol/L (ref 0.0–2.0)
Bicarbonate: 24.2 mmol/L (ref 20.0–28.0)
Calcium, Ion: 1.2 mmol/L (ref 1.15–1.40)
HCT: 43 % (ref 39.0–52.0)
Hemoglobin: 14.6 g/dL (ref 13.0–17.0)
O2 Saturation: 90 %
Patient temperature: 36.8
Potassium: 4.8 mmol/L (ref 3.5–5.1)
Sodium: 137 mmol/L (ref 135–145)
TCO2: 25 mmol/L (ref 22–32)
pCO2 arterial: 42.8 mmHg (ref 32.0–48.0)
pH, Arterial: 7.358 (ref 7.350–7.450)
pO2, Arterial: 61 mmHg — ABNORMAL LOW (ref 83.0–108.0)

## 2020-09-29 LAB — COOXEMETRY PANEL
Carboxyhemoglobin: 0.7 % (ref 0.5–1.5)
Carboxyhemoglobin: 0.9 % (ref 0.5–1.5)
Methemoglobin: 0.8 % (ref 0.0–1.5)
Methemoglobin: 0.8 % (ref 0.0–1.5)
O2 Saturation: 47.3 %
O2 Saturation: 50 %
Total hemoglobin: 14.6 g/dL (ref 12.0–16.0)
Total hemoglobin: 13.1 g/dL (ref 12.0–16.0)

## 2020-09-29 LAB — HEPARIN LEVEL (UNFRACTIONATED): Heparin Unfractionated: 0.43 IU/mL (ref 0.30–0.70)

## 2020-09-29 LAB — MAGNESIUM: Magnesium: 2.5 mg/dL — ABNORMAL HIGH (ref 1.7–2.4)

## 2020-09-29 MED ORDER — AMIODARONE IV BOLUS ONLY 150 MG/100ML
150.0000 mg | Freq: Once | INTRAVENOUS | Status: AC
  Administered 2020-09-29: 150 mg via INTRAVENOUS

## 2020-09-29 MED ORDER — EPINEPHRINE HCL 5 MG/250ML IV SOLN IN NS
0.5000 ug/min | INTRAVENOUS | Status: DC
  Administered 2020-09-30: 0.5 ug/min via INTRAVENOUS
  Filled 2020-09-29: qty 250

## 2020-09-29 MED ORDER — SODIUM CHLORIDE 0.9 % IV SOLN
2000.0000 mg | Freq: Once | INTRAVENOUS | Status: AC
  Administered 2020-09-29: 2000 mg via INTRAVENOUS
  Filled 2020-09-29: qty 20

## 2020-09-29 MED ORDER — ALBUMIN HUMAN 5 % IV SOLN
12.5000 g | Freq: Once | INTRAVENOUS | Status: AC
  Administered 2020-09-29: 12.5 g via INTRAVENOUS
  Filled 2020-09-29: qty 250

## 2020-09-29 MED ORDER — INSULIN DETEMIR 100 UNIT/ML ~~LOC~~ SOLN
4.0000 [IU] | Freq: Two times a day (BID) | SUBCUTANEOUS | Status: DC
  Administered 2020-09-29 (×2): 4 [IU] via SUBCUTANEOUS
  Filled 2020-09-29 (×4): qty 0.04

## 2020-09-29 MED ORDER — INSULIN ASPART 100 UNIT/ML IJ SOLN
4.0000 [IU] | INTRAMUSCULAR | Status: DC
  Administered 2020-09-29 – 2020-09-30 (×6): 4 [IU] via SUBCUTANEOUS

## 2020-09-29 NOTE — Progress Notes (Addendum)
Advanced Heart Failure Rounding Note  PCP-Cardiologist: Donato Schultz, MD   Subjective:   8/15 blood Cx No growth.  8/17 Started on amio for A flutter. Neurology consulted for extreme twitching. EEG ordered.   Intubated FiO2 50 % on CVVHD.  Remains on dobutamine 10 mcg + norepi 24 mcg.   IABP 1:1  CO-OX 47%   [place]wan#s  CVP 11 PA 45/28 CI 1.6 CO 3.5 SVR ~ 1600  Objective:   Weight Range: 82 kg Body mass index is 23.21 kg/m.   Vital Signs:   Temp:  [97.5 F (36.4 C)-99.3 F (37.4 C)] 98.2 F (36.8 C) (08/18 0700) Pulse Rate:  [69-117] 108 (08/18 0700) Resp:  [14-18] 15 (08/18 0700) BP: (116-142)/(49-106) 139/69 (08/18 0700) SpO2:  [94 %-100 %] 100 % (08/18 0700) Arterial Line BP: (40-172)/(29-82) 103/76 (08/18 0700) FiO2 (%):  [30 %-80 %] 50 % (08/18 0432) Weight:  [82 kg] 82 kg (08/18 0500) Last BM Date: 09/28/20  Weight change:    Intake/Output:   Intake/Output Summary (Last 24 hours) at 09/29/2020 0741 Last data filed at 09/29/2020 0700 Gross per 24 hour  Intake 3386.27 ml  Output 7361 ml  Net -3974.73 ml      Physical Exam   General:  Intubated/sedated. On CVVHD.  HEENT: ETT Neck: supple. Difficult to assess. Carotids 2+ bilat; no bruits. No lymphadenopathy or thryomegaly appreciated. LIJ  Cor: PMI nondisplaced. Tachy regular rate & rhythm. No rubs, gallops or murmurs. Lungs: clear Abdomen: soft, nontender, nondistended. No hepatosplenomegaly. No bruits or masses. Good bowel sounds. Extremities: no cyanosis, clubbing, rash, edema. R-IABP. R radial a line.  Neuro: Intubated/sedated  Telemetry  A flutter 110-120s    Labs    CBC Recent Labs    09/25/2020 2024 09/16/2020 2247 09/26/2020 0325 10/10/2020 0451 09/28/20 1947 09/29/20 0341  WBC 7.6  --  8.6  --   --  11.5*  NEUTROABS 6.2  --   --   --   --   --   HGB 12.7*   < > 10.9*   < > 13.9 14.0  HCT 38.9*   < > 35.2*   < > 41.0 44.3  MCV 109.9*  --  113.9*  --   --  109.9*  PLT 239  --  184   --   --  156   < > = values in this interval not displayed.   Basic Metabolic Panel Recent Labs    91/27/10 0348 09/28/20 1522 09/28/20 1947 09/29/20 0341  NA 135 135 138 134*  K 4.4 4.5 4.7 5.1  CL 99 98  --  98  CO2 25 26  --  22  GLUCOSE 185* 190*  --  243*  BUN 41* 32*  --  30*  CREATININE 4.74* 3.74*  --  2.99*  CALCIUM 8.8* 9.0  --  9.3  MG 2.5*  --   --  2.5*  PHOS 4.3 3.9  --  4.0   Liver Function Tests Recent Labs    09/29/2020 0325 10/01/2020 1552 09/28/20 1522 09/29/20 0341  AST 314*  --   --  578*  ALT 211*  --   --  1,054*  ALKPHOS 101  --   --  111  BILITOT 2.4*  --   --  1.2  PROT 5.6*  --   --  7.0  ALBUMIN 3.3*   < > 3.5 3.7  3.7   < > = values in this interval not displayed.  Recent Labs    10/10/2020 2024  LIPASE 38   Cardiac Enzymes No results for input(s): CKTOTAL, CKMB, CKMBINDEX, TROPONINI in the last 72 hours.  BNP: BNP (last 3 results) No results for input(s): BNP in the last 8760 hours.  ProBNP (last 3 results) No results for input(s): PROBNP in the last 8760 hours.   D-Dimer No results for input(s): DDIMER in the last 72 hours. Hemoglobin A1C No results for input(s): HGBA1C in the last 72 hours. Fasting Lipid Panel No results for input(s): CHOL, HDL, LDLCALC, TRIG, CHOLHDL, LDLDIRECT in the last 72 hours. Thyroid Function Tests No results for input(s): TSH, T4TOTAL, T3FREE, THYROIDAB in the last 72 hours.  Invalid input(s): FREET3  Other results:   Imaging    No results found.   Medications:     Scheduled Medications:  aspirin  81 mg Per Tube Daily   B-complex with vitamin C  1 tablet Per Tube Daily   chlorhexidine gluconate (MEDLINE KIT)  15 mL Mouth Rinse BID   Chlorhexidine Gluconate Cloth  6 each Topical Daily   docusate  100 mg Per Tube BID   feeding supplement (PROSource TF)  45 mL Per Tube QID   insulin aspart  0-6 Units Subcutaneous Q4H   mouth rinse  15 mL Mouth Rinse 10 times per day   pantoprazole  sodium  40 mg Per Tube Daily   polyethylene glycol  17 g Per Tube Daily   sodium chloride flush  10-40 mL Intracatheter Q12H   sodium chloride flush  3 mL Intravenous Q12H    Infusions:   prismasol BGK 4/2.5 400 mL/hr at 09/29/20 0300    prismasol BGK 4/2.5 200 mL/hr at 09/29/20 0500   sodium chloride Stopped (09/28/20 0738)   amiodarone 30 mg/hr (09/29/20 0700)   dexmedetomidine (PRECEDEX) IV infusion 0.7 mcg/kg/hr (09/29/20 0700)   DOBUTamine 10 mcg/kg/min (09/29/20 0700)   feeding supplement (VITAL 1.5 CAL) 55 mL/hr at 09/29/20 0600   fentaNYL infusion INTRAVENOUS 125 mcg/hr (09/29/20 0700)   heparin 10,000 units/ 20 mL infusion syringe 500 Units/hr (09/29/20 0100)   heparin 1,000 Units/hr (09/29/20 0700)   norepinephrine (LEVOPHED) Adult infusion 25 mcg/min (09/29/20 0700)   prismasol BGK 4/2.5 1,500 mL/hr at 09/29/20 0500    PRN Medications: sodium chloride, acetaminophen, dextrose, fentaNYL (SUBLIMAZE) injection, fentaNYL (SUBLIMAZE) injection, heparin, heparin, midazolam, midazolam, ondansetron (ZOFRAN) IV, sodium chloride flush, sodium chloride flush    Patient Profile   70 y/o male with HTN,. DM2, OSA, CAD s/p CABG 5573, chronic systolic HF (EF 22-02% in 9/20), ESRD (started HD in 7/21).   Admitted cardiogenic shock- Angiography with stable CAD with 3/4 grafts patent. Felt to have myo-pericarditis. IABP+ pressors+ vent +CVVHD  Assessment/Plan  1. Acute on chronic systolic HF -> cardiogenic shock  - EF 40-45% in 9/20 due to ICM - Bedside echo EF 5-10% - hstrop 7k-> 14k - Cath 10/03/2020 with stable CAD -> suspect acute myo-pericarditis  - lactate > 11.0-->3.9-->1.5  - CO-OX 47%- Continue IABP. Continue DBA 10   + Norepi 24 .  - Will need CVVHD for volume removal - - Not candidate for advanced therapies/durable VAD so no real role for Impella as bridge. Overall prognosis poor.    2. Acute hypoxic respiratory failure - due to pulmonary edema. PCWP 44 at cath - Continue  CVVHD for volume removal  -CCM managing    3. ESRD - on HD since 7/21 - Continues on CVVHD. Nephrology appreciated.    4. Elevated hs  troponin with CAD s/p CABG 2017 - Bedside echo EF 5-10% - hstrop 7k-> 14k - Cath 10/11/2020 with stable CAD -> suspect acute myo-pericarditis  - ASA/statin - no b-blocker with shock   5. DM2 - SSI. Otherwise per primary   6. A flutterr RVR -Remains in A flutter. Continue amio drip.   -Continue heparin drip.    7. Possible seizures  Neurology consulted Unable to obtain CT head with IABP.  EEG ordered.   8. Elevated LFTS -Suspect in the setting of shock.   Needs GOC.  Length of Stay: 2  Darrick Grinder, NP  09/29/2020, 7:41 AM  Advanced Heart Failure Team Pager [phone] (M-F; 7a - 5p)  Please contact [facility] for night-coverage after hours (5p -7a ) and weekends on [email or web address]  Agree with above.  Remains intubated on dual pressors and IABP. Overnight with myoclonic jerks. Now getting EEG. Keppra started. Unable to get CT head overnight due to IABP. Remains in AFL. Co-ox remains low.   General:  Intubated/sedated HEENT: normal + ETT Neck: supple. + HD cath Carotids 2+ bilat; no bruits. No lymphadenopathy or thryomegaly appreciated. Cor: PMI nondisplaced. Irregular tachy  No rubs, gallops or murmurs. Lungs: clear Abdomen: soft, nontender, nondistended. No hepatosplenomegaly. No bruits or masses. Good bowel sounds. Extremities: no cyanosis, clubbing, rash, edema RFA IABP  Neuro: intubated sedated + gag  Remains critically ill with MSOF.Unclear if he has had a stroke or seizure. Amio may also be contributing.  Will await results of EEG and attempt to get CT brain.   Likely has severe myopericarditis. Not candidate for advanced therapies due to age and ESRD. Given current condition would not favor upgrade to Impella as possible bridge to recovery as success likely low at this point.If Neuro status improves can consider DC-CV of AFL.   Long  discussion with family. They told me that Mr Silbernagel has clearly stated that he would not want to be support for more than 72 hours. Will continue full support for now but we have made him limited code with no CPR or defib.   Keep even on CVVHD today. Check CT.   CRITICAL CARE Performed by: Glori Bickers  Total critical care time: 35 minutes  Critical care time was exclusive of separately billable procedures and treating other patients.  Critical care was necessary to treat or prevent imminent or life-threatening deterioration.  Critical care was time spent personally by me (independent of midlevel providers or residents) on the following activities: development of treatment plan with patient and/or surrogate as well as nursing, discussions with consultants, evaluation of patient's response to treatment, examination of patient, obtaining history from patient or surrogate, ordering and performing treatments and interventions, ordering and review of laboratory studies, ordering and review of radiographic studies, pulse oximetry and re-evaluation of patient's condition.  Glori Bickers, MD  9:32 AM

## 2020-09-29 NOTE — Progress Notes (Signed)
Pt has audible cuff leak which did not resolved by adding air to the cuff more than once.  ETT advanced by 2cm.  Cuff leak resolved.  X-ray ordered to verify ETT placement.

## 2020-09-29 NOTE — Progress Notes (Signed)
[facility] Progress Note   Assessment/ Plan:   1.  Cardiogenic shock: Acute exacerbation of congestive heart failure with reduced ejection fraction and suspected to be viral myocarditis.+ IABP, 1:1.  On CRRT (8/16-).  All 4K bath, intracircuit heparin.  Keep even today.  On dobutamine and levophed, overall increased requirements.  CVP 11 today.   2.  End-stage renal disease: Usually on Monday/Wednesday/Friday schedule.  On CRRT 3.  Hyperkalemia: Resolved on CRRT 4.  Anion gap metabolic acidosis/lactic acidosis: Resolved on CRRT 5.  Anemia of chronic kidney disease: Hemoglobin and hematocrit currently acceptable, will continue to follow trend to decide on need for supplementation. 6.  Chronic kidney disease-metabolic bone disease: Currently n.p.o. while intubated, monitor off binders anticipating electrolyte depletion with CRRT. 7.  Dispo: per pt's wife and dtr-- would not want to try to be kept on life support for > 72 hrs.  We're close to that.  Agree with no CPR in the event of a cardiac arrest.    Subjective:    Seen and examined.  Myoclonus yesterday, neuro consulted.  Head CT recommended, on cEEG.     Objective:   BP (!) 141/91   Pulse (!) 111   Temp 97.7 F (36.5 C)   Resp 17   Ht $R'6\' 2"'mm$  (1.88 m)   Wt 82 kg   SpO2 100%   BMI 23.21 kg/m   Physical Exam: Gen: intubated, sedated CVS: tachycardic, IABP in place Resp: coarse mech bilaterally Abd: soft Ext: trace LE edema ACCESS: L IJ trialysis catheter in place, L AVF + T/B  Labs: BMET Recent Labs  Lab 09/17/2020 2024 10/03/2020 2247 09/20/2020 0325 09/17/2020 0451 09/23/2020 1552 09/17/2020 1950 09/29/2020 2144 09/28/20 0348 09/28/20 1522 09/28/20 1947 09/29/20 0341  NA 133*   < > 137   < > 138 136 138 135 135 138 134*  K 5.1   < > 5.3*   < > 4.0 3.8 4.0 4.4 4.5 4.7 5.1  CL 92*  --  91*  --  97*  --   --  99 98  --  98  CO2 11*  --  14*  --  24  --   --  25 26  --  22  GLUCOSE 180*  --  62*  --  149*  --    --  185* 190*  --  243*  BUN 47*  --  48*  --  53*  --   --  41* 32*  --  30*  CREATININE 6.80*  --  7.38*  --  6.58*  --   --  4.74* 3.74*  --  2.99*  CALCIUM 9.0  --  8.7*  --  8.6*  --   --  8.8* 9.0  --  9.3  PHOS  --   --  11.6*  --  6.9*  --   --  4.3 3.9  --  4.0   < > = values in this interval not displayed.   CBC Recent Labs  Lab 09/24/2020 2024 09/25/2020 2247 09/28/2020 0325 10/05/2020 0451 10/05/2020 1950 09/16/2020 2144 09/28/20 1947 09/29/20 0341  WBC 7.6  --  8.6  --   --   --   --  11.5*  NEUTROABS 6.2  --   --   --   --   --   --   --   HGB 12.7*   < > 10.9*   < > 12.2* 12.6* 13.9 14.0  HCT 38.9*   < >  35.2*   < > 36.0* 37.0* 41.0 44.3  MCV 109.9*  --  113.9*  --   --   --   --  109.9*  PLT 239  --  184  --   --   --   --  156   < > = values in this interval not displayed.      Medications:     aspirin  81 mg Per Tube Daily   B-complex with vitamin C  1 tablet Per Tube Daily   chlorhexidine gluconate (MEDLINE KIT)  15 mL Mouth Rinse BID   Chlorhexidine Gluconate Cloth  6 each Topical Daily   docusate  100 mg Per Tube BID   feeding supplement (PROSource TF)  45 mL Per Tube QID   insulin aspart  0-6 Units Subcutaneous Q4H   insulin aspart  4 Units Subcutaneous Q4H   insulin detemir  4 Units Subcutaneous BID   mouth rinse  15 mL Mouth Rinse 10 times per day   pantoprazole sodium  40 mg Per Tube Daily   polyethylene glycol  17 g Per Tube Daily   sodium chloride flush  10-40 mL Intracatheter Q12H   sodium chloride flush  3 mL Intravenous Q12H     Madelon Lips, MD 09/29/2020, 11:20 AM

## 2020-09-29 NOTE — Progress Notes (Signed)
ANTICOAGULATION CONSULT NOTE - Follow Up Consult  Pharmacy Consult for Heparin  Indication:  IABP  and afib/flutter  Not on File  Patient Measurements: Height: 6\' 2"  (188 cm) Weight: 82 kg (180 lb 12.4 oz) IBW/kg (Calculated) : 82.2  Vital Signs: Temp: 98.2 F (36.8 C) (08/18 1530) Temp Source: Core (08/18 1200) BP: 97/52 (08/18 1512) Pulse Rate: 115 (08/18 1530)  Labs: Recent Labs    09/20/2020 2024 09/13/2020 2129 10/01/2020 2247 09/17/2020 0325 09/20/2020 0451 09/26/2020 0542 10/11/2020 1226 10/01/2020 1944 10/04/2020 1950 09/27/20 2144 09/28/20 0348 09/28/20 1137 09/28/20 1522 09/28/20 1947 09/29/20 0341  HGB 12.7*  --    < > 10.9*   < >  --   --   --    < > 12.6*  --   --   --  13.9 14.0  HCT 38.9*  --    < > 35.2*   < >  --   --   --    < > 37.0*  --   --   --  41.0 44.3  PLT 239  --   --  184  --   --   --   --   --   --   --   --   --   --  156  LABPROT 19.9*  --   --   --   --   --   --   --   --   --   --   --   --   --   --   INR 1.7*  --   --   --   --   --   --   --   --   --   --   --   --   --   --   HEPARINUNFRC  --   --   --   --   --   --    < > 0.30  --   --   --  0.37  --   --  0.43  CREATININE 6.80*  --   --  7.38*  --   --    < >  --   --   --  4.74*  --  3.74*  --  2.99*  TROPONINIHS 6,552* 7,265*  --  32,951*  --  18,109*  --   --   --   --   --   --   --   --   --    < > = values in this interval not displayed.     Estimated Creatinine Clearance: 25.9 mL/min (A) (by C-G formula based on SCr of 2.99 mg/dL (H)).   Medical History: Past Medical History:  Diagnosis Date   Abnormal stress test 02/10/2015   Acute on chronic systolic and diastolic heart failure, NYHA class 1 ([place]) 12/31/2014   Anemia    Anemia in chronic kidney disease 09/03/2012   Arthritis    left  5th finger   Asthma    Asthma, chronic 06/04/2012   Asthma, chronic, mild intermittent, with acute exacerbation 12/22/2015   Bilateral lower extremity edema 12/22/2015   CAD (coronary artery  disease) 02/18/2015   Chronic combined systolic (congestive) and diastolic (congestive) heart failure ([place])    a. 12/31/14: 2D ECHO: EF 40-45%, HK of inf myocardium, G1DD, mod MR   Chronic combined systolic and diastolic CHF (congestive heart failure) ([place]) 88/41/6606   Chronic systolic heart failure ([place]) 02/25/2015  CKD (chronic kidney disease), stage III ([place])    stage 3 kidney disease   CKD (chronic kidney disease), stage V ([place]) 08/30/2018   COLONIC POLYPS, HX OF 04/13/2009   Qualifier: Diagnosis of  By: Westly Pam.    Coronary artery disease    a. LHC 01/2015 - triple vessel CAD (mod oLM, mLAD, severe mRCA, intermediate branch stenosis, CTO of mCx). Plan CABG 02/2015.   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 Mild-mod by echo 12/2014.   Morbid obesity ([place]) 12/29/2014   Myocardial infarction [facility])    pt. states per [name] he has in the past   NSVT (nonsustained ventricular tachycardia) ([place])    a. 9 beats during 01/2015 adm. BB titrated.   Nuclear sclerotic cataract of left eye 05/26/2019   Nuclear sclerotic cataract of right eye 05/26/2019   Obesity    a. BMI 33   Obesity (BMI 30-39.9) 05/05/2013   OSA (obstructive sleep apnea) 01/16/2018   Mild obstructive sleep apnea overall with an AHI of 7.3/h and no significant central sleep apnea. Severe obstructive sleep apnea during REM sleep with  an AHI of 34.3/h.  Now on CPAP at 6 cm H2O.    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 Pt was taken to cath where IABP was placed. Pt has ESRD on HD.  Heparin level at goal this morning on heparin at rate of 1000 units/hr + 500 uts/hr in CRRT circuit No bleeding or issues with line per RN. Hemoglobin stable in the 14s, plt count  trending down, will watch closely while on IABP (156).   Goal of Therapy:  Heparin level 0.3-0.5 units/ml Monitor platelets by anticoagulation protocol: Yes   Plan:  Continue heparin drip at 1000 units/hr with heparin 500 uts/hr in CRRT circuit  Daily CBC/heparin level Monitor for bleeding   Erin Hearing PharmD., BCPS Clinical Pharmacist 09/29/2020 3:36 PM

## 2020-09-29 NOTE — Progress Notes (Addendum)
NAME:  Henry Carroll, MRN:  [id number], DOB:  07-12-1950, LOS: 2 ADMISSION DATE:  10/11/2020, CONSULTATION DATE:  8/16 REFERRING MD:  Dr. Haroldine Laws , CHIEF COMPLAINT:  cardiogenic shock   History of Present Illness:  Patient is encephalopathic and/or intubated. Therefore history has been obtained from chart review.  70 year old male with past medical history as below, which is significant for end-stage renal disease on dialysis with most recent dialysis 8/15, coronary artery disease status post four-vessel CABG, heart failure with reduced ejection fraction, asthma, diabetes mellitus, and obstructive sleep apnea. He presented to [facility] ED 8/15 with complaints of chest pain x 2 days. In the ED he was noted to be hypotensive with cool extremities concerning for cardiogenic shock. Bedside echo showed severely decreased ejection fraction. Troponin was elevated. He was transferred to [facility] for possible invasive cardiac evaluation.   Upon arrival to [facility] he was taken immediately to the cath lab for NSTEMI. He was not able to tolerate the supine position and required intubation. Grafts were felt to be patent, but EF noted to be around 5-10%. Balloon pump placed. He did not require pressors during the case. Post procedurally he was taken to the ICU for ongoing care.   Pertinent  Medical History   has a past medical history of Abnormal stress test (02/10/2015), Acute on chronic systolic and diastolic heart failure, NYHA class 1 ([place]) (12/31/2014), Anemia, Anemia in chronic kidney disease (09/03/2012), Arthritis, Asthma, Asthma, chronic (06/04/2012), Asthma, chronic, mild intermittent, with acute exacerbation (12/22/2015), Bilateral lower extremity edema (12/22/2015), CAD (coronary artery disease) (02/18/2015), Chronic combined systolic (congestive) and diastolic (congestive) heart failure ([place]), Chronic combined systolic and diastolic CHF (congestive heart failure) ([place]) (02/10/2015),  Chronic systolic heart failure ([place]) (02/25/2015), CKD (chronic kidney disease), stage III ([place]), CKD (chronic kidney disease), stage V ([place]) (08/30/2018), COLONIC POLYPS, HX OF (04/13/2009), Coronary artery disease, Demand ischemia ([place]) (12/31/2014), DM type 2, uncontrolled, with renal complications ([place]) (1/96/2229), Dyspnea, Elevated troponin, Essential hypertension (7/98/9211), Folliculitis of perineum (10/01/2012), Gastric erosion, GERD (gastroesophageal reflux disease), History of blood transfusion, Hyperlipidemia, Hyperlipidemia with target LDL less than 100 (12/31/2014), Hypertension, Hypertensive heart disease with heart failure ([place]) (02/25/2015), IDA (iron deficiency anemia) (01/23/2016), Melena (01/23/2016), 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), Mitral regurgitation, Morbid obesity ([place]) (12/29/2014), Myocardial infarction [facility]), NSVT (nonsustained ventricular tachycardia) ([place]), Nuclear sclerotic cataract of left eye (05/26/2019), Nuclear sclerotic cataract of right eye (05/26/2019), Obesity, Obesity (BMI 30-39.9) (05/05/2013), OSA (obstructive sleep apnea) (01/16/2018), Other malaise and fatigue (09/03/2012), 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  Significant Hospital Events: Including procedures, antibiotic start and stop dates in addition to other pertinent events   8/16 admit to [facility].  Intubated.  Right femoral PAC placed. IV heparin started.IABP placed. Inotrope support. HD cath placed for CRRT 8/17 oozing from HD cath insertion site, injected subq with lido+epi, started on amio for Aflutter   Interim History / Subjective:   Neuro consulted overnight for twitching, remains on cEEG, s/p load with  keppra, pending CTH today Remains on IABP 1:1, coox down 65.5-> 47% Remains on CRRT, UF kept even overnight due to increasing vasopressor requirements NE at 24 mcg/min and dobutamine 10 mcg/kg/min w/ amio and heparin gtts Sedated on precedex and fentanyl  CVP 11, PA 45/28, CI 1.6, CO 3.5, SVR ~1600  Tmax 99.3 +3.9L/ net +6.9L  Worsening LFTs, increasing glucose  Objective   Blood pressure (!) 144/69, pulse (!) 108, temperature 98.2 F (36.8 C), resp. rate 16, height 6\' 2"  (1.88 m), weight 82 kg, SpO2 100 %. PAP: (32-55)/(20-36) 45/29 CVP:  [5 mmHg-16 mmHg] 11 mmHg CO:  [2.5 L/min-3.3 L/min] 2.7 L/min CI:  [1.1 L/min/m2-1.5 L/min/m2] 1.3 L/min/m2  Vent Mode: PRVC FiO2 (%):  [30 %-80 %] 50 % Set Rate:  [16 bmp] 16 bmp Vt Set:  [570 mL] 570 mL PEEP:  [5 cmH20] 5 cmH20 Plateau Pressure:  [16 cmH20-22 cmH20] 19 cmH20   Intake/Output Summary (Last 24 hours) at 09/29/2020 3893 Last data filed at 09/29/2020 0800 Gross per 24 hour  Intake 3282.04 ml  Output 6846 ml  Net -3563.96 ml   Filed Weights   10/02/2020 0400 09/28/20 0500 09/29/20 0500  Weight: 90.7 kg 89.7 kg 82 kg   General:  critically ill elderly male sedated/ intubated on MV HEENT: MM pink/moist, ETT at 23 at lip, OGT, pupils 3/reactive, +right corneal reflex, left absent Neuro: sedated on fent 150, precedex 0.7, flicker in RLE/ RUE to noxious stimuli, flaccid on left CV: rr, distant heart sounds, some mechanical sounds, right femoral site wnl, +1 dp pulses PULM:  triggering MV, non labored, clear bilaterally  GI: soft, +bs, mechanical sounds, ND Extremities: warm/dry, no LE edema L IJ HD site wnl, no further oozing   Resolved Hospital Problem list     Assessment & Plan:   Acute on chronic HFrEF with Cardiogenic shock: has been worked up by cardiology. It is not felt to be ischemic in nature. Viral myo-pericarditis? EF at 5-10% by bedside echo. Balloon pump placed in cath lab 8/16.  -Co-Oximetry still low Plan: Per  HF team  Continues on IABP 1:1 CRRT for volume removal; current goal even balance Heparin per pharmacy  Cont ASA/ statin Trending hemodynamics, coox  Not a candidate for advanced therapies per HF.  Overall prognosis is poor.   Acute hypoxemic respiratory failure OSA Endotracheal tube in sinus position, PICC line in satisfactory position.  Progressive bilateral patchy airspace disease left greater than right.  Cannot exclude an element of edema.  Endotracheal tube 3 position.  Cardiomegaly vascular fullness but no pulmonary edema Plan Continue MV support, 8cc/kg IBW with goal Pplat <30 and DP<15  VAP prevention protocol/ PPI PAD protocol for sedation> fentanyl/ precedex, wean after CTH, RASS goal -1/-2 Wean FiO2 as able for SpO2 >92%  CRRT for volume   NSTEMI:  likely demand ischemia for poor global perfusion Plan Per cardiology/ HF, as above  ESRD on HD (MWF) Hyponatremia: hypervolemic AGMA/lactic acidosis: lactic acid 11 on admission.  Remains elevated, LFTs are elevated which is likely due to hepatic congestion.  I wonder if this is resulting in lactate clearing slowly -Nephrology consulted 8/16 Plan CRRT per nephrology  Trending BMET  Abnormal LFTs.  Suspect shock liver versus hepatic congestion Plan Trend LFTs  DM Plan SSI, goal CBGs 140-180 Adding TF coverage and levemir.  May need to transition to gtt   R/o seizure vs myoclonus  Plan Per Neurology.  cEEG ongoing- no seizures thus far.  AEDs per neurology  Plans for [facility] today to rule out ICH/ intracranial process, if neg, will wean sedation for better neuro exam   Best Practice (right click and "Reselect all SmartList Selections" daily)   Diet/type: NPO; TF DVT prophylaxis: prophylactic heparin  GI prophylaxis: PPI Lines: Central line Foley:  N/A Code Status:  full code Last date of multidisciplinary goals of care discussion []  Wife and daughter updated at bedside.   CCT 40 mins   Kennieth Rad,  ACNP [facility] 09/29/2020, 9:26 AM  See Amion for pager If no response to pager, please call PCCM consult pager After 7:00 pm call [facility] Attending:   27 yo M, multiple medical problems, admitted for cardiogenic shock, IABP, on pressors, on mechanical vent. CVVHD and pulling even overnight due to low BP.   BP (!) 141/94   Pulse (!) 109   Temp 97.7 F (36.5 C)   Resp 18   Ht 6\' 2"  (1.88 m)   Wt 82 kg   SpO2 98%   BMI 23.21 kg/m   Gen: chronically ill appearing male, intubated on life support  HENT: ETT in place  Heart: RRR, s1 s2 Lungs: BL vented breaths  Abd: Soft, nt nd   Labs: Reviewed   A:  Cardiogenic shock, IABP  Acute Systolic Heart Failure  AHRF on MV  OSA  NSTEMI vs myopericarditis  ESRD on HD MWF, now on CVVHD  DMII   P: Remains on adult mechanical vent support  Wean from vent as tolerated  Remains on too much support at this time  I think is overall prognosis is poor  Agree with code status change Continue maximal support for now Continue to wean pressors as tolerated. Still pulling even at this time with cvvhd as unable to tolerate negative pull.  This patient is critically ill with multiple organ system failure; which, requires frequent high complexity decision making, assessment, support, evaluation, and titration of therapies. This was completed through the application of advanced monitoring technologies and extensive interpretation of multiple databases. During this encounter critical care time was devoted to patient care services described in this note for 46 minutes.   Garner Nash, DO [facility] 09/29/2020 12:47 PM

## 2020-09-29 NOTE — Progress Notes (Addendum)
Paged cardiology about decreasing cardiac outputs and indexes  3/1.4 at 2000 2.7/1.26 at 0000  No changes in care at this time.  Addendum: CO/CI at 0400 was 2.5/1.1 RN paged cardiology and was awaiting orders when Dr. Earlie Server from CCM rounded on the unit and said to increase dobutamine to 7.4mcg (previously 60mcg). If no changes in numbers, RN ordered to inform Dr. Earlie Server  0530  After the increase to 7.5 mcg, CO/CI 2.3/1.06 Verbal orders to increase to 10 mcg per Earlie Server

## 2020-09-29 NOTE — Progress Notes (Signed)
See consult note from Dr. Lorrin Goodell from earlier this AM.   He continues to have some mild multifocal(not generalized) myoclonus without clear EEG change, which does seem to worsen with stimulus.   He has intact pupils and corneals, no motor response in either UE, or left leg. With proximal stimulation, he does seem to withdraw the right leg.   Possibilities include medication related(amiodarone, fentanyl), metabolic, or hypoxia-related.   Will get CT head and continue EEG monitoring for now. Would favor weaning sedation after CT.   Roland Rack, MD [facility] [phone]  If 7pm- 7am, please page neurology on call as listed in [place].

## 2020-09-29 NOTE — Progress Notes (Signed)
Performed EEG maintenance.  No skin breakdown observed at electrode sites Fp1, Fp2, P8.

## 2020-09-29 NOTE — Progress Notes (Signed)
LTM maint complete - no skin breakdown under:  Fp2 F3 A2  maintenance O1  O2  T8 P8  [facility] monitored, Event button test confirmed by [facility].

## 2020-09-29 NOTE — Procedures (Addendum)
Patient Name: Henry Carroll  MRN: [id number]  Epilepsy Attending: Lora Havens  Referring Physician/Provider: Dr Trevor Mace  Duration: 09/29/2020 0103 to 10-01-20 0103  Patient history: 70 year old with intermittent twitching concerning for myoclonic jerks.  EEG to evaluate for seizures.  Level of alertness: lethargic  AEDs during EEG study: Keppra  Technical aspects: This EEG study was done with scalp electrodes positioned according to the 10-20 International system of electrode placement. Electrical activity was acquired at a sampling rate of 500Hz  and reviewed with a high frequency filter of 70Hz  and a low frequency filter of 1Hz . EEG data were recorded continuously and digitally stored.   Description: EEG showed continuous generalized 3 to 6 Hz theta-delta slowing. Hyperventilation and photic stimulation were not performed.     Patient event button was pressed on 09/29/2020 at 1906. During the episode patient was initially noted to have right shoulder jerks followed by bilateral upper extremity elevation. She then had a brief right upper extremity jerking. Patient was then noted to have semipurposeful movements.  Concomitant EEG before, during and after the event did not show any EEG to suggest seizure.  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 No seizures or epileptiform discharges were seen throughout the recording.  Patient event button was pressed on 09/29/2020 at 1906 as described above without concomitant EEG change.  This was most likely not an epileptic event.  However, focal motor seizures may not be seen on scalp EEG.  Therefore clinical correlation is recommended.  Truc Winfree Barbra Sarks

## 2020-09-29 NOTE — Progress Notes (Signed)
eLink Physician-Brief Progress Note Patient Name: Henry Carroll DOB: [birth year] MRN: [id number]   Date of Service  09/29/2020  HPI/Events of Note  Patient cannot be safely transported to CT at this time because of his dependence on IABP.  eICU Interventions  Stat CT brain deferred.        Kerry Kass Necola Bluestein 09/29/2020, 12:54 AM

## 2020-09-29 NOTE — Progress Notes (Signed)
EEG complete - results pending 

## 2020-09-29 NOTE — Progress Notes (Signed)
Inpatient Diabetes Program Recommendations  AACE/ADA: New Consensus Statement on Inpatient Glycemic Control   Target Ranges:  Prepandial:   less than 140 mg/dL      Peak postprandial:   less than 180 mg/dL (1-2 hours)      Critically ill patients:  140 - 180 mg/dL   Results for DEAARON, FULGHUM (MRN [id number]) as of 09/29/2020 10:08  Ref. Range 09/28/2020 07:47 09/28/2020 11:37 09/28/2020 15:22 09/28/2020 19:45 09/28/2020 23:32 09/29/2020 03:53 09/29/2020 08:29 09/29/2020 08:32  Glucose-Capillary Latest Ref Range: 70 - 99 mg/dL 196 (H) 222 (H) 197 (H) 208 (H) 251 (H) 252 (H) 321 (H) 323 (H)    Review of Glycemic Control  Diabetes history: DM2 Outpatient Diabetes medications: Levemir 18 units QHS Current orders for Inpatient glycemic control: Novolog 0-6 units Q4H; Vital @ 55 ml/hr  Inpatient Diabetes Program Recommendations:    Insulin: Please consider ordering Levemir 8 units Q24H and Novolog 4 units Q4H for tube feeding coverage. If tube feeding is stopped or held then Novolog tube feeding coverage should also be stopped or held.  Thanks, Barnie Alderman, RN, MSN, CDE Diabetes Coordinator Inpatient Diabetes Program [phone] (Team Pager from 8am to 5pm)

## 2020-09-29 NOTE — Progress Notes (Signed)
Pt transported to CT and back to 2H20 without any complications.

## 2020-09-29 NOTE — Consult Note (Addendum)
NEUROLOGY CONSULTATION NOTE   Date of service: September 29, 2020 Patient Name: Henry Carroll MRN:  [id number] DOB:  08-27-1950 Reason for consult: "Extremity twitching" Requesting Provider: Trevor Mace, MD _ _ _   _ __   _ __ _ _  __ __   _ __   __ _  History of Present Illness  Henry Carroll is a 70 y.o. male with ESRD on MWF HD, CAD s/p 4 vessel CABG, HF with reduced EF, astham ,DM2, OSA who was transferred from [facility] ED with cardiogenic shock with severely reduced EF. He was transferred to [facility] for NSTEMI, was intuabted, grafts were patent with EF of 5-10%. IABP was placed, he was started on Heparin  and he was admitted to the ICU. He is also on CRRT.  He was noted to have brief myoclonic jerks lasting a couple seconds, mostly in RUE but also in face with eyebrow twitching and in LUE. With stimulation, these were noted to be more prominent.  Neurology was asked to evaluate him for concern for potential seizures.  He is intubated on fentanyl and precedex gtt and unable to answer any questions or follow any commands. History obtained through chart review.   ROS   Unable to obtain ROS 2/2 intubation and sedation.  Past History   Past Medical History:  Diagnosis Date   Abnormal stress test 02/10/2015   Acute on chronic systolic and diastolic heart failure, NYHA class 1 ([place]) 12/31/2014   Anemia    Anemia in chronic kidney disease 09/03/2012   Arthritis    left  5th finger   Asthma    Asthma, chronic 06/04/2012   Asthma, chronic, mild intermittent, with acute exacerbation 12/22/2015   Bilateral lower extremity edema 12/22/2015   CAD (coronary artery disease) 02/18/2015   Chronic combined systolic (congestive) and diastolic (congestive) heart failure ([place])    a. 12/31/14: 2D ECHO: EF 40-45%, HK of inf myocardium, G1DD, mod MR   Chronic combined systolic and diastolic CHF (congestive heart failure) ([place]) 75/44/9201   Chronic systolic heart failure ([place]) 02/25/2015   CKD  (chronic kidney disease), stage III (HCC)    stage 3 kidney disease   CKD (chronic kidney disease), stage V ([place]) 08/30/2018   COLONIC POLYPS, HX OF 04/13/2009   Qualifier: Diagnosis of  By: Westly Pam.    Coronary artery disease    a. LHC 01/2015 - triple vessel CAD (mod oLM, mLAD, severe mRCA, intermediate branch stenosis, CTO of mCx). Plan CABG 02/2015.   Demand ischemia ([place]) 12/31/2014   DM type 2, uncontrolled, with renal complications ([place]) 0/08/1217   Dyspnea     due to fluid   Elevated troponin    Essential hypertension 7/58/8325   Folliculitis of perineum 10/01/2012   Gastric erosion    GERD (gastroesophageal reflux disease)    History of blood transfusion    Hyperlipidemia    Hyperlipidemia with target LDL less than 100 12/31/2014   Hypertension    Hypertensive heart disease with heart failure (HCC) 02/25/2015   IDA (iron deficiency anemia) 01/23/2016   Melena 01/23/2016   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   Mitral regurgitation    a. Mild-mod by echo 12/2014.   Morbid obesity ([place]) 12/29/2014   Myocardial infarction [facility])    pt. states per [name] he has in the past   NSVT (nonsustained ventricular tachycardia) ([place])    a. 9 beats during 01/2015 adm. BB titrated.   Nuclear  sclerotic cataract of left eye 05/26/2019   Nuclear sclerotic cataract of right eye 05/26/2019   Obesity    a. BMI 33   Obesity (BMI 30-39.9) 05/05/2013   OSA (obstructive sleep apnea) 01/16/2018   Mild obstructive sleep apnea overall with an AHI of 7.3/h and no significant central sleep apnea. Severe obstructive sleep apnea during REM sleep with an AHI of 34.3/h.  Now on CPAP at 6 cm H2O.    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   Type II diabetes mellitus ([place])    Vitreous hemorrhage of right eye ([place]) 05/26/2019   Wears dentures    Past Surgical History:  Procedure Laterality Date   BACK SURGERY     BASCILIC VEIN TRANSPOSITION Left 11/14/2018   Procedure: FIRST STAGE BASILIC VEIN TRANSPOSITION LEFT ARM;  Surgeon: Serafina Mitchell, MD;  Location: [place];  Service: Vascular;  Laterality: Left;   [place] Left 01/28/2019   Procedure: BASILIC VEIN TRANSPOSITION SECOND STAGE;  Surgeon: Serafina Mitchell, MD;  Location: [place];  Service: Vascular;  Laterality: Left;   BONE MARROW BIOPSY     CARDIAC CATHETERIZATION N/A 02/09/2015   Procedure: Left Heart Cath and Coronary Angiography;  Surgeon: Burnell Blanks, MD;  Location: [place] CV [facility];  Service: Cardiovascular;  Laterality: N/A;   CATARACT EXTRACTION Bilateral 06/11/2020   COLONOSCOPY  2011   Dr. [name]: Ileocecal valve appeared normal, scattered pancolonic diverticulosis, difficult bowel prep making smaller lesions potentially missed. Recommended three-year follow-up colonoscopy.   COLONOSCOPY  2003   Dr. [name]: Suspicious lesion at the ileocecal valve, multiple biopsies benign, pancolonic diverticulosis.   COLONOSCOPY N/A 02/15/2016   diverticulosis in sigmoid and descending colon, single 5 mm polyp at splenic flexure (Tubular adenoma)   CORONARY ARTERY BYPASS GRAFT N/A 02/18/2015   Procedure: CORONARY ARTERY BYPASS GRAFTING (CABG) x  four, using bilateral internal mammary arteries and right leg greater saphenous vein harvested endoscopically;  Surgeon: [name], MD;  Location: [place];  Service: Open Heart Surgery;  Laterality: N/A;   CORONARY/GRAFT ACUTE MI REVASCULARIZATION N/A 09/16/2020   Procedure: Coronary/Graft Acute MI Revascularization;  Surgeon: Jettie Booze, MD;  Location: [place] [facility];  Service: Cardiovascular;  Laterality: N/A;   ESOPHAGOGASTRODUODENOSCOPY N/A 02/15/2016   normal   EYE SURGERY  Right    July 2019    EYE SURGERY Left 06/2020   GIVENS CAPSULE STUDY N/A 01/08/2017   couple of gastric and small bowel eroions in setting of aspirin 81 mg daily but nothing concerning, continue Hematology follow-up   HERNIA REPAIR  1740   Umbilical   IABP INSERTION N/A 09/29/2020   Procedure: IABP Insertion;  Surgeon: Jettie Booze, MD;  Location: [place] [facility];  Service: Cardiovascular;  Laterality: N/A;   LEFT HEART CATH AND CORONARY ANGIOGRAPHY N/A 10/06/2020   Procedure: LEFT HEART CATH AND CORONARY ANGIOGRAPHY;  Surgeon: Jettie Booze, MD;  Location: [place] [facility];  Service: Cardiovascular;  Laterality: N/A;   LUMBAR DISC SURGERY  2006   L4 & L5   REFRACTIVE SURGERY Left    2019    REFRACTIVE SURGERY Right    2019   RIGHT HEART CATH N/A 10/08/2020   Procedure:  RIGHT HEART CATH;  Surgeon: Jettie Booze, MD;  Location: [place] [facility];  Service: Cardiovascular;  Laterality: N/A;   TEE WITHOUT CARDIOVERSION N/A 02/18/2015   Procedure: TRANSESOPHAGEAL ECHOCARDIOGRAM (TEE);  Surgeon: [name], MD;  Location: [place];  Service: Open Heart Surgery;  Laterality: N/A;   TONSILLECTOMY  1962   Family History  Problem Relation Age of Onset   Diabetes Mother    Heart disease Mother        later in life, age >58   Heart disease Father        heart failure later in life   Hypertension Father    Stroke Sister    Kidney disease Daughter    Sudden death Daughter    Colon cancer Paternal Uncle        Passed from colon CA   Heart attack Neg Hx    Social History   Socioeconomic History   Marital status: Married    Spouse name: Not on file   Number of children: Not on file   Years of education: Not on file   Highest education level: Not on file  Occupational History   Not on file  Tobacco Use   Smoking status: Former    Types: Cigars    Quit date: 12/31/2014    Years since quitting: 5.7   Smokeless tobacco: Never  Vaping Use   Vaping Use:  Never used  Substance and Sexual Activity   Alcohol use: Not Currently    Alcohol/week: 3.0 standard drinks    Types: 3 Glasses of wine per week   Drug use: No   Sexual activity: Yes    Birth control/protection: None  Other Topics Concern   Not on file  Social History Narrative   Not on file   Social Determinants of Health   Financial Resource Strain: Not on file  Food Insecurity: Not on file  Transportation Needs: Not on file  Physical Activity: Not on file  Stress: Not on file  Social Connections: Not on file   Not on File  Medications   Medications Prior to Admission  Medication Sig Dispense Refill Last Dose   acetaminophen (TYLENOL) 500 MG tablet Take 1,000 mg by mouth every 6 (six) hours as needed for mild pain or headache.      albuterol (PROVENTIL) (2.5 MG/3ML) 0.083% nebulizer solution Take 3 mLs (2.5 mg total) by nebulization every 6 (six) hours as needed for wheezing or shortness of breath. 150 mL 5    amLODipine (NORVASC) 10 MG tablet Take 1 tablet by mouth once daily (Patient taking differently: Take 10 mg by mouth daily.) 90 tablet 1    aspirin EC 81 MG tablet Take 1 tablet (81 mg total) by mouth daily. 90 tablet 3    atorvastatin (LIPITOR) 40 MG tablet TAKE 1 TABLET BY MOUTH ONCE DAILY IN THE EVENING (Patient taking differently: Take 40 mg by mouth every evening.) 90 tablet 1    carvedilol (COREG) 12.5 MG tablet Take 12.5 mg by mouth daily.       Cholecalciferol (VITAMIN D3) 25 MCG (1000 UT) CAPS Take 2,000 Units by mouth daily.      Cinnamon 500 MG capsule Take 1,000 mg by mouth daily.       Coenzyme Q10 10 MG capsule Take 10 mg by mouth daily.      epoetin alfa-epbx (RETACRIT) 29518 UNIT/ML injection 20,000 Units every 14 (fourteen) days.       ezetimibe (ZETIA) 10 MG  tablet Take 1 tablet (10 mg total) by mouth daily. 90 tablet 3    fluticasone (FLONASE) 50 MCG/ACT nasal spray Place 2 sprays into both nostrils daily as needed for allergies or rhinitis (SEASONAL  ALLERGIES).      fluticasone-salmeterol (ADVAIR DISKUS) 100-50 MCG/ACT AEPB Inhale 1 puff into the lungs 2 (two) times daily. 60 each 5    furosemide (LASIX) 40 MG tablet Take 40 mg by mouth daily. On S-M-W-F      glucose blood (ONE TOUCH ULTRA TEST) test strip Use to test blood sugar three times daily. Dx: E11.21 900 each 1    insulin detemir (LEVEMIR) 100 UNIT/ML injection Inject 0.18 mLs (18 Units total) into the skin daily. APPOINTMENT OVERDUE (Patient taking differently: No sig reported) 10 mL 0    isosorbide dinitrate (ISORDIL) 10 MG tablet Take by mouth.      ketorolac (ACULAR) 0.5 % ophthalmic solution 1 drop 3 (three) times daily.      montelukast (SINGULAIR) 10 MG tablet TAKE 1 TABLET BY MOUTH AT BEDTIME (Patient taking differently: Take 10 mg by mouth at bedtime.) 90 tablet 1    Multiple Vitamin (MULTIVITAMIN WITH MINERALS) TABS tablet Take 1 tablet by mouth daily.      nitroGLYCERIN (NITROSTAT) 0.4 MG SL tablet Place 1 tablet (0.4 mg total) under the tongue every 5 (five) minutes as needed for chest pain. 25 tablet 3    ofloxacin (OCUFLOX) 0.3 % ophthalmic solution 1 drop 3 (three) times daily.      prednisoLONE acetate (PRED FORTE) 1 % ophthalmic suspension 1 drop 3 (three) times daily.      PROAIR HFA 108 (90 Base) MCG/ACT inhaler INHALE 2 PUFFS BY MOUTH EVERY 6 HOURS AS NEEDED FOR SHORTNESS OF BREATH (Patient taking differently: Inhale 2 puffs into the lungs every 6 (six) hours as needed for shortness of breath.) 18 g 5    sevelamer carbonate (RENVELA) 800 MG tablet Take 800 mg by mouth in the morning, at noon, in the evening, and at bedtime.      vitamin E 45 MG (100 UNITS) capsule Take 100 Units by mouth daily.        Vitals   Vitals:   09/28/20 2130 09/28/20 2200 09/28/20 2300 09/28/20 2313  BP:  133/64 132/62   Pulse: (!) 109 (!) 109 (!) 107   Resp: $Remo'17 16 16 16  'jLusL$ Temp: 99.1 F (37.3 C) 99.3 F (37.4 C) 99.1 F (37.3 C) 99.1 F (37.3 C)  TempSrc:      SpO2: 99% 100%  100% 100%  Weight:      Height:         Body mass index is 25.39 kg/m.  Physical Exam   General: Laying comfortably in bed; intubated. HENT: Normal oropharynx and mucosa. Normal external appearance of ears and nose. Neck: Supple, no pain or tenderness  CV: No JVD. No peripheral edema.  Pulmonary: Symmetric Chest rise. Normal respiratory effort.  Abdomen: Soft to touch, non-tender.  Ext: No cyanosis, edema, or deformity  Skin: No rash. Normal palpation of skin.   Musculoskeletal: Normal digits and nails by inspection. No clubbing.   Neurologic Examination  Mental status/Cognition: Eyes are partially open. No response to loud voice or clap. Symmetric upper facial grimae to nare stimulation.  Brainstem: Cough: weakly positive Gag: absent Pupils: 35mm BL, round and reactive to light Corneals: positive BL.  Motor/sensory:  Muscle bulk: normal, tone increased tone No response to noxious stimuli in any of the extremities. Noted  to have brief jerking of face, RUE and LUE. It is more pronounced with stimulation.  Reflexes:  Right Left Comments  Pectoralis      Biceps (C5/6) 2 2   Brachioradialis (C5/6) 2 2    Triceps (C6/7) 2 2    Patellar (L3/4) 2 2    Achilles (S1)      [place]      Plantar     Jaw jerk    Coordination/Complex Motor:  Unable to assess.  Labs   CBC:  Recent Labs  Lab 09/19/2020 2024 10/10/2020 2247 09/23/2020 0325 09/24/2020 0451 10/08/2020 2144 09/28/20 1947  WBC 7.6  --  8.6  --   --   --   NEUTROABS 6.2  --   --   --   --   --   HGB 12.7*   < > 10.9*   < > 12.6* 13.9  HCT 38.9*   < > 35.2*   < > 37.0* 41.0  MCV 109.9*  --  113.9*  --   --   --   PLT 239  --  184  --   --   --    < > = values in this interval not displayed.    Basic Metabolic Panel:  Lab Results  Component Value Date   NA 138 09/28/2020   K 4.7 09/28/2020   CO2 26 09/28/2020   GLUCOSE 190 (H) 09/28/2020   BUN 32 (H) 09/28/2020   CREATININE 3.74 (H) 09/28/2020   CALCIUM 9.0  09/28/2020   GFRNONAA 16 (L) 09/28/2020   GFRAA 8 (L) 10/05/2019   Lipid Panel:  Lab Results  Component Value Date   LDLCALC 104 (H) 09/14/2020   HgbA1c:  Lab Results  Component Value Date   HGBA1C 7.9 (H) 09/14/2020   Urine Drug Screen: No results found for: LABOPIA, COCAINSCRNUR, LABBENZ, AMPHETMU, THCU, LABBARB  Alcohol Level No results found for: ETH  CT Head without contrast: pending  cEEG:  pending  Impression   ERNEST POPOWSKI is a 70 y.o. male with ESRD on MWF HD, CAD s/p 4 vessel CABG, HF with reduced EF, astham ,DM2, OSA who was transferred from [facility] ED with cardiogenic shock with severely reduced EF. He was transferred to [facility] for NSTEMI, was intuabted, grafts were patent with EF of 5-10%. IABP was placed, he was started on Heparin  and he was admitted to the ICU. He is also on CRRT.  Henry evaluated him for intermittent twtiching worse with stimulation. On my evaluation the movements appear consistent with myoclonic jerks althou I did see a single episode where the RUE jerking appeared rhythmic.   I suspect that these are likely metabolic in origin but with his presentation and poor responsiveness, I would recommend getting a CT Head w/o contrast to rule out ICH and a cEEG to rule out seizures. I will load with Keppra 2G IV once/  Recommendations  - CT Head w/o contrast to evaluate for large territory infarct or ICH - cEEG to rule out seizures. Oncall EEG tech notified. - Keppra 2G IV once until cEEG is hooked up.  Plan discussed with Dr. Warrick Parisian over phone. ______________________________________________________________________  This patient is critically ill and at significant risk of neurological worsening, death and care requires constant monitoring of vital signs, hemodynamics,respiratory and cardiac monitoring, neurological assessment, discussion with family, other specialists and medical decision making of high complexity. I spent 35 minutes of neurocritical  care time  in the care of  this patient. This was time spent independent of any time provided by nurse practitioner or PA.  Donnetta Simpers [facility] Pager Number [phone] 09/29/2020  1:03 AM   Thank you for the opportunity to take part in the care of this patient. If you have any further questions, please contact the neurology consultation attending.  Signed,  [place] Pager Number [phone] _ _ _   _ __   _ __ _ _  __ __   _ __   __ _

## 2020-09-29 NOTE — ED Provider Notes (Signed)
.  Critical Care  Date/Time: 09/29/2020 8:08 AM Performed by: Luna Fuse, MD Authorized by: Luna Fuse, MD   Critical care provider statement:    Critical care time (minutes):  30   Critical care time was exclusive of:  Separately billable procedures and treating other patients and teaching time   Critical care was necessary to treat or prevent imminent or life-threatening deterioration of the following conditions:  Cardiac failure Comments:     Severe cardiac shock, sinus tachycardia, narrow pulse pressure, NSTEMI    Luna Fuse, MD 09/29/20 [phone]

## 2020-09-30 ENCOUNTER — Inpatient Hospital Stay (HOSPITAL_COMMUNITY): Payer: Medicare Other

## 2020-09-30 DIAGNOSIS — J9601 Acute respiratory failure with hypoxia: Secondary | ICD-10-CM | POA: Diagnosis not present

## 2020-09-30 DIAGNOSIS — N186 End stage renal disease: Secondary | ICD-10-CM | POA: Diagnosis not present

## 2020-09-30 DIAGNOSIS — Z992 Dependence on renal dialysis: Secondary | ICD-10-CM | POA: Diagnosis not present

## 2020-09-30 DIAGNOSIS — R570 Cardiogenic shock: Secondary | ICD-10-CM | POA: Diagnosis not present

## 2020-09-30 DIAGNOSIS — R569 Unspecified convulsions: Secondary | ICD-10-CM

## 2020-09-30 DIAGNOSIS — I400 Infective myocarditis: Secondary | ICD-10-CM | POA: Diagnosis not present

## 2020-09-30 DIAGNOSIS — G9340 Encephalopathy, unspecified: Secondary | ICD-10-CM

## 2020-09-30 LAB — COOXEMETRY PANEL
Carboxyhemoglobin: 1 % (ref 0.5–1.5)
Methemoglobin: 1 % (ref 0.0–1.5)
O2 Saturation: 51.5 %
Total hemoglobin: 13.3 g/dL (ref 12.0–16.0)

## 2020-09-30 LAB — COMPREHENSIVE METABOLIC PANEL
ALT: 813 U/L — ABNORMAL HIGH (ref 0–44)
AST: 276 U/L — ABNORMAL HIGH (ref 15–41)
Albumin: 2.9 g/dL — ABNORMAL LOW (ref 3.5–5.0)
Alkaline Phosphatase: 79 U/L (ref 38–126)
Anion gap: 9 (ref 5–15)
BUN: 31 mg/dL — ABNORMAL HIGH (ref 8–23)
CO2: 23 mmol/L (ref 22–32)
Calcium: 8.6 mg/dL — ABNORMAL LOW (ref 8.9–10.3)
Chloride: 104 mmol/L (ref 98–111)
Creatinine, Ser: 2.54 mg/dL — ABNORMAL HIGH (ref 0.61–1.24)
GFR, Estimated: 26 mL/min — ABNORMAL LOW (ref >60)
Glucose, Bld: 212 mg/dL — ABNORMAL HIGH (ref 70–99)
Potassium: 4.8 mmol/L (ref 3.5–5.1)
Sodium: 136 mmol/L (ref 135–145)
Total Bilirubin: 1.3 mg/dL — ABNORMAL HIGH (ref 0.3–1.2)
Total Protein: 6 g/dL — ABNORMAL LOW (ref 6.5–8.1)

## 2020-09-30 LAB — CBC
HCT: 40.9 % (ref 39.0–52.0)
Hemoglobin: 13.1 g/dL (ref 13.0–17.0)
MCH: 34.7 pg — ABNORMAL HIGH (ref 26.0–34.0)
MCHC: 32 g/dL (ref 30.0–36.0)
MCV: 108.5 fL — ABNORMAL HIGH (ref 80.0–100.0)
Platelets: 77 10*3/uL — ABNORMAL LOW (ref 150–400)
RBC: 3.77 MIL/uL — ABNORMAL LOW (ref 4.22–5.81)
RDW: 16.3 % — ABNORMAL HIGH (ref 11.5–15.5)
WBC: 1.9 10*3/uL — ABNORMAL LOW (ref 4.0–10.5)
nRBC: 2.7 % — ABNORMAL HIGH (ref 0.0–0.2)

## 2020-09-30 LAB — CBC WITH DIFFERENTIAL/PLATELET
Abs Immature Granulocytes: 0.04 10*3/uL (ref 0.00–0.07)
Basophils Absolute: 0 10*3/uL (ref 0.0–0.1)
Basophils Relative: 0 %
Eosinophils Absolute: 0 10*3/uL (ref 0.0–0.5)
Eosinophils Relative: 0 %
HCT: 42.3 % (ref 39.0–52.0)
Hemoglobin: 13.6 g/dL (ref 13.0–17.0)
Immature Granulocytes: 1 %
Lymphocytes Relative: 3 %
Lymphs Abs: 0.1 10*3/uL — ABNORMAL LOW (ref 0.7–4.0)
MCH: 35.1 pg — ABNORMAL HIGH (ref 26.0–34.0)
MCHC: 32.2 g/dL (ref 30.0–36.0)
MCV: 109.3 fL — ABNORMAL HIGH (ref 80.0–100.0)
Monocytes Absolute: 0.3 10*3/uL (ref 0.1–1.0)
Monocytes Relative: 10 %
Neutro Abs: 2.7 10*3/uL (ref 1.7–7.7)
Neutrophils Relative %: 86 %
Platelets: 95 10*3/uL — ABNORMAL LOW (ref 150–400)
RBC: 3.87 MIL/uL — ABNORMAL LOW (ref 4.22–5.81)
RDW: 16.4 % — ABNORMAL HIGH (ref 11.5–15.5)
WBC: 3.1 10*3/uL — ABNORMAL LOW (ref 4.0–10.5)
nRBC: 1.9 % — ABNORMAL HIGH (ref 0.0–0.2)

## 2020-09-30 LAB — RENAL FUNCTION PANEL
Albumin: 2.9 g/dL — ABNORMAL LOW (ref 3.5–5.0)
Anion gap: 11 (ref 5–15)
BUN: 30 mg/dL — ABNORMAL HIGH (ref 8–23)
CO2: 20 mmol/L — ABNORMAL LOW (ref 22–32)
Calcium: 8.4 mg/dL — ABNORMAL LOW (ref 8.9–10.3)
Chloride: 104 mmol/L (ref 98–111)
Creatinine, Ser: 2.41 mg/dL — ABNORMAL HIGH (ref 0.61–1.24)
GFR, Estimated: 28 mL/min — ABNORMAL LOW (ref >60)
Glucose, Bld: 225 mg/dL — ABNORMAL HIGH (ref 70–99)
Phosphorus: 2.9 mg/dL (ref 2.5–4.6)
Potassium: 4.6 mmol/L (ref 3.5–5.1)
Sodium: 135 mmol/L (ref 135–145)

## 2020-09-30 LAB — GLUCOSE, CAPILLARY
Glucose-Capillary: 214 mg/dL — ABNORMAL HIGH (ref 70–99)
Glucose-Capillary: 209 mg/dL — ABNORMAL HIGH (ref 70–99)

## 2020-09-30 LAB — MAGNESIUM: Magnesium: 2.3 mg/dL (ref 1.7–2.4)

## 2020-09-30 LAB — LACTIC ACID, PLASMA
Lactic Acid, Venous: 2.3 mmol/L (ref 0.5–1.9)
Lactic Acid, Venous: 2.5 mmol/L (ref 0.5–1.9)

## 2020-09-30 LAB — HEPARIN LEVEL (UNFRACTIONATED): Heparin Unfractionated: 0.27 IU/mL — ABNORMAL LOW (ref 0.30–0.70)

## 2020-09-30 MED ORDER — VANCOMYCIN HCL 1750 MG/350ML IV SOLN
1750.0000 mg | Freq: Once | INTRAVENOUS | Status: DC
  Filled 2020-09-30: qty 350

## 2020-09-30 MED ORDER — GLYCOPYRROLATE 0.2 MG/ML IJ SOLN: 0.2000 mg | INTRAMUSCULAR | Status: DC | PRN

## 2020-09-30 MED ORDER — GLYCOPYRROLATE 0.2 MG/ML IJ SOLN
0.2000 mg | INTRAMUSCULAR | Status: DC | PRN
  Administered 2020-09-30: 0.2 mg via INTRAVENOUS
  Filled 2020-09-30: qty 1

## 2020-09-30 MED ORDER — DIPHENHYDRAMINE HCL 50 MG/ML IJ SOLN: 25.0000 mg | INTRAMUSCULAR | Status: DC | PRN

## 2020-09-30 MED ORDER — ACETAMINOPHEN 650 MG RE SUPP: 650.0000 mg | Freq: Four times a day (QID) | RECTAL | Status: DC | PRN

## 2020-09-30 MED ORDER — ONDANSETRON HCL 4 MG/2ML IJ SOLN: 4.0000 mg | Freq: Four times a day (QID) | INTRAMUSCULAR | Status: DC | PRN

## 2020-09-30 MED ORDER — ONDANSETRON 4 MG PO TBDP
4.0000 mg | ORAL_TABLET | Freq: Four times a day (QID) | ORAL | Status: DC | PRN
  Filled 2020-09-30: qty 1

## 2020-09-30 MED ORDER — GLYCOPYRROLATE 1 MG PO TABS
1.0000 mg | ORAL_TABLET | ORAL | Status: DC | PRN
  Filled 2020-09-30: qty 1

## 2020-09-30 MED ORDER — POLYVINYL ALCOHOL 1.4 % OP SOLN
1.0000 [drp] | Freq: Four times a day (QID) | OPHTHALMIC | Status: DC | PRN
  Filled 2020-09-30: qty 15

## 2020-09-30 MED ORDER — SODIUM CHLORIDE 0.9 % IV SOLN: 2.0000 g | Freq: Once | INTRAVENOUS | Status: DC

## 2020-09-30 MED ORDER — MIDAZOLAM HCL 2 MG/2ML IJ SOLN
2.0000 mg | INTRAMUSCULAR | Status: DC | PRN
  Administered 2020-09-30: 2 mg via INTRAVENOUS
  Filled 2020-09-30: qty 4

## 2020-09-30 MED ORDER — ACETAMINOPHEN 325 MG PO TABS: 650.0000 mg | ORAL_TABLET | Freq: Four times a day (QID) | ORAL | Status: DC | PRN

## 2020-09-30 MED ORDER — VASOPRESSIN 20 UNITS/100 ML INFUSION FOR SHOCK
0.0000 [IU]/min | INTRAVENOUS | Status: DC
  Administered 2020-09-30: 0.03 [IU]/min via INTRAVENOUS
  Filled 2020-09-30: qty 100

## 2020-10-01 LAB — CULTURE, BLOOD (ROUTINE X 2)
Culture: NO GROWTH
Culture: NO GROWTH
Special Requests: ADEQUATE
Special Requests: ADEQUATE

## 2020-10-02 LAB — CULTURE, BLOOD (ROUTINE X 2): Special Requests: ADEQUATE

## 2020-10-13 ENCOUNTER — Encounter (INDEPENDENT_AMBULATORY_CARE_PROVIDER_SITE_OTHER): Payer: Medicare Other | Admitting: Ophthalmology

## 2020-10-13 NOTE — Progress Notes (Addendum)
Subjective: Continues to have myoclonus without EEG change  Exam: Vitals:   04-Oct-2020 0945 Oct 04, 2020 1000  BP:  (!) 123/56  Pulse:  (!) 104  Resp: 20 (!) 22  Temp: 97.7 F (36.5 C) (!) 97.5 F (36.4 C)  SpO2:  91%   Gen: In bed, NAD Resp: non-labored breathing, no acute distress Abd: soft, nt  Neuro: MS: eyes partially open, does not fixate or engage examiner. Does not follow commands. He does appear to slightly turn head towards voice once, but this is unclear.  HU:OHFGB, corneals intact Motor: no response to noxious stimulation, but he does have frequent polymyoclonus.  Sensory:as above.   Pertinent Labs: ALT 813, AST 276 Cr 2.54 with BUN 31  Impression: 70 yo M with acute metabolic vs hypoperfusion related coma with polymoyoclonus in the setting of severe heart failure with concurrent evidence of liver injury and AKI. My suspicion is that the polymyoclonus is metabolic in nature due to his general medical condition. The heart failure team is recommending comfort measures.   Without seizure >24 hours, no need for continued EEG monitoring. From a prognostic perspective, I would think prognosis is much more driven by his other organ systems at the current time.   Recommendations: 1) If aggressive care is pursued, will check ammonia, though in this setting, not sure how aggressively we can pursue treatment.  2) could use low dose benzodiazepine for myoclonus if needded.  3) neurology will follow if aggressive care is pursued.   Roland Rack, MD [facility] [phone]  If 7pm- 7am, please page neurology on call as listed in [place].

## 2020-10-13 NOTE — Progress Notes (Signed)
Discontinued cEEG study.  Notified [facility] monitoring.  No skin breakdown observed. 

## 2020-10-13 NOTE — Progress Notes (Signed)
Chaplain responded to page from RN this morning and met with pt's wife, Henry Carroll and daughter Henry Carroll at his bedside.  Chaplain followed up with family 3 times throughout the day. Family shared their grief as they made the decision to withdraw care on Henry Carroll, prepared to say good bye while waiting for family to arrive, and as they prepared to leave after his demise. Chaplain asked open ended questions to facilitate emotional expression and story telling and life review. Chaplain offered prayers for comfort at family request. Family expressed gratitude for support and displays appropriate grief responses and coping skills.  Please page as further needs arise.  Donald Prose. Elyn Peers, M.Div. [facility] Chaplain Pager [phone] Office [phone]

## 2020-10-13 NOTE — Progress Notes (Addendum)
Advanced Heart Failure Rounding Note  PCP-Cardiologist: Candee Furbish, MD   Subjective:   8/15 blood Cx No growth.  8/17 Started on amio for A flutter. Neurology consulted for extreme twitching. EEG>>moderate to severe diffuse encephalopathy, nonspecific etiology. No seizures or epileptiform discharges  8/18 Head CT negative for acute infarct. No evidence of diffuse anoxic brain Injury  Remains critically ill w/ increased pressor requirements overnight. Had rapid Afib/Flutter requiring rebolus of IV amio. NE now maxed out to 30, DBA 10,  VP 0.03 and Epi 2 added. Low grade fever overnight, mTemp 99.5. WBC 1.9   Lactic acid rising, 1.5>>2.3>>2.5   Plts down 156>>95>>77K  Hgb 13   Intubated. On CVVH but unable to pull currently. CVP 10   IABP 1:1  CO-OX 52%   MAPs 50s   Swan#s  CVP 10 PA 38/26 (31) CI 2.05 CO 4.29 SVR ~ 840  Objective:   Weight Range: 82.3 kg Body mass index is 23.3 kg/m.   Vital Signs:   Temp:  [97.2 F (36.2 C)-100.2 F (37.9 C)] 97.7 F (36.5 C) (08/19 0700) Pulse Rate:  [106-131] 127 (08/19 0700) Resp:  [16-26] 21 (08/19 0700) BP: (97-144)/(45-94) 117/70 (08/19 0700) SpO2:  [92 %-100 %] 94 % (08/19 0700) Arterial Line BP: (58-153)/(38-93) 92/60 (08/19 0700) FiO2 (%):  [40 %-60 %] 60 % (08/19 0400) Weight:  [82.3 kg] 82.3 kg (08/19 0400) Last BM Date: 10-21-2020  Weight change:    Intake/Output:   Intake/Output Summary (Last 24 hours) at 10-21-2020 0757 Last data filed at 10-21-20 0700 Gross per 24 hour  Intake 4337.07 ml  Output 2730 ml  Net 1607.07 ml      Physical Exam    CVP 10  General:  critically ill, intubated and sedated. HEENT: + ETT Neck: supple. + Swan Rt IJ + HD cath Lt IJ. Carotids 2+ bilat; no bruits. No lymphadenopathy or thyromegaly appreciated. Cor: PMI nondisplaced. Irregularly irregular and tachy. No rubs, gallops or murmurs. Lungs: intubated and clear  Abdomen: soft, nontender, nondistended. No  hepatosplenomegaly. No bruits or masses. Good bowel sounds. Extremities: no cyanosis, clubbing, rash, edema, cool distal extremities + Rt Femoral IABP  Neuro: intubated and sedated    Telemetry   Afib 110s-120s    Labs    CBC Recent Labs    09/29/20 2316 09/29/20 2331 2020/10/21 0442  WBC 3.1*  --  1.9*  NEUTROABS 2.7  --   --   HGB 13.6 14.6 13.1  HCT 42.3 43.0 40.9  MCV 109.3*  --  108.5*  PLT 95*  --  77*   Basic Metabolic Panel Recent Labs    09/29/20 0341 09/29/20 1641 09/29/20 2316 09/29/20 2331 10-21-2020 0442  NA 134* 134* 136 137 135  K 5.1 5.3* 4.8 4.8 4.6  CL 98 103 104  --  104  CO2 _0 --  20*  GLUCOSE 243* 263* 212*  --  225*  BUN 30* 35* 31*  --  30*  CREATININE 2.99* 2.75* 2.54*  --  2.41*  CALCIUM 9.3 8.5* 8.6*  --  8.4*  MG 2.5*  --   --   --  2.3  PHOS 4.0 3.5  --   --  2.9   Liver Function Tests Recent Labs    09/29/20 0341 09/29/20 1641 09/29/20 2316 October 21, 2020 0442  AST 578*  --  276*  --   ALT 1,054*  --  813*  --   ALKPHOS 111  --  79  --   BILITOT 1.2  --  1.3*  --   PROT 7.0  --  6.0*  --   ALBUMIN 3.7  3.7   < > 2.9* 2.9*   < > = values in this interval not displayed.   No results for input(s): LIPASE, AMYLASE in the last 72 hours.  Cardiac Enzymes No results for input(s): CKTOTAL, CKMB, CKMBINDEX, TROPONINI in the last 72 hours.  BNP: BNP (last 3 results) No results for input(s): BNP in the last 8760 hours.  ProBNP (last 3 results) No results for input(s): PROBNP in the last 8760 hours.   D-Dimer No results for input(s): DDIMER in the last 72 hours. Hemoglobin A1C No results for input(s): HGBA1C in the last 72 hours. Fasting Lipid Panel No results for input(s): CHOL, HDL, LDLCALC, TRIG, CHOLHDL, LDLDIRECT in the last 72 hours. Thyroid Function Tests No results for input(s): TSH, T4TOTAL, T3FREE, THYROIDAB in the last 72 hours.  Invalid input(s): FREET3  Other results:   Imaging    CT HEAD WO CONTRAST  (5MM)  Result Date: 09/29/2020 CLINICAL DATA:  Anoxic brain damage. EXAM: CT HEAD WITHOUT CONTRAST TECHNIQUE: Contiguous axial images were obtained from the base of the skull through the vertex without intravenous contrast. COMPARISON:  None. FINDINGS: Brain: There is age related volume loss. There is a good deal of artifact relating to what probably represents an EEG. There is small vessel change of the white matter but no sign of acute infarction, mass lesion, hemorrhage, hydrocephalus or extra-axial collection. Vascular: There is atherosclerotic calcification of the major vessels at the base of the brain. Skull: Negative Sinuses/Orbits: Clear/normal Other: None IMPRESSION: Considerable artifact related to EEG. No evidence of focal acute infarction or diffuse anoxic brain injury. Age related volume loss and chronic small-vessel ischemic change of the white matter. Electronically Signed   By: Henry Carroll M.D.   On: 09/29/2020 13:29   DG CHEST PORT 1 VIEW  Result Date: 09/29/2020 CLINICAL DATA:  Rhonchi. EXAM: PORTABLE CHEST 1 VIEW COMPARISON:  September 29, 2020 (5:21 a.m.) FINDINGS: There is stable endotracheal tube, nasogastric tube and left internal jugular venous catheter positioning. Multiple sternal wires are present. Right basilar atelectasis and/or infiltrate is seen. This is mildly increased in severity when compared to the prior exam. No pneumothorax is identified. The cardiac silhouette is enlarged and unchanged in size. The visualized skeletal structures are unremarkable. IMPRESSION: Right basilar atelectasis and/or infiltrate, increased in severity when compared to the prior study. Electronically Signed   By: Henry Carroll M.D.   On: 09/29/2020 19:50   Overnight EEG with video  Result Date: 09/29/2020 Henry Havens, MD     09/29/2020  9:47 AM Patient Name: Henry Carroll MRN: [id number] Epilepsy Attending: Lora Carroll Referring Physician/Provider: Dr Henry Carroll Duration:  09/29/2020 0103 to 09/29/2020 0945 Patient history: 70 year old with intermittent twitching concerning for myoclonic jerks.  EEG to evaluate for seizures. Level of alertness: lethargic AEDs during EEG study: Keppra Technical aspects: This EEG study was done with scalp electrodes positioned according to the 10-20 International system of electrode placement. Electrical activity was acquired at a sampling rate of _0  and reviewed with a high frequency filter of _1  and a low frequency filter of _2 . EEG data were recorded continuously and digitally stored. Description: EEG showed continuous generalized 3 to 6 Hz theta-delta slowing. Hyperventilation and photic stimulation were not performed.   ABNORMALITY - Continuous slow, generalized IMPRESSION: This study is suggestive of moderate  to severe diffuse encephalopathy, nonspecific etiology. No seizures or epileptiform discharges were seen throughout the recording. Priyanka Barbra Sarks     Medications:     Scheduled Medications:  aspirin  81 mg Per Tube Daily   B-complex with vitamin C  1 tablet Per Tube Daily   chlorhexidine gluconate (MEDLINE KIT)  15 mL Mouth Rinse BID   Chlorhexidine Gluconate Cloth  6 each Topical Daily   docusate  100 mg Per Tube BID   feeding supplement (PROSource TF)  45 mL Per Tube QID   insulin aspart  0-6 Units Subcutaneous Q4H   insulin aspart  4 Units Subcutaneous Q4H   insulin detemir  4 Units Subcutaneous BID   mouth rinse  15 mL Mouth Rinse 10 times per day   pantoprazole sodium  40 mg Per Tube Daily   polyethylene glycol  17 g Per Tube Daily   sodium chloride flush  10-40 mL Intracatheter Q12H   sodium chloride flush  3 mL Intravenous Q12H    Infusions:   prismasol BGK 4/2.5 400 mL/hr at 09/29/20 2226    prismasol BGK 4/2.5 200 mL/hr at 2020/10/18 0100   sodium chloride Stopped (09/28/20 0738)   amiodarone 30 mg/hr (18-Oct-2020 0700)   dexmedetomidine (PRECEDEX) IV infusion 0.5 mcg/kg/hr (2020-10-18 0700)   DOBUTamine  10 mcg/kg/min (10-18-2020 0700)   epinephrine     feeding supplement (VITAL 1.5 CAL) 1,000 mL (09/29/20 1610)   fentaNYL infusion INTRAVENOUS 75 mcg/hr (10-18-2020 0700)   heparin 10,000 units/ 20 mL infusion syringe 500 Units/hr (09/29/20 2245)   heparin 1,000 Units/hr (10/18/20 0700)   norepinephrine (LEVOPHED) Adult infusion 70 mcg/min (2020-10-18 0700)   prismasol BGK 4/2.5 1,500 mL/hr at 10/18/2020 3007   vasopressin 0.03 Units/min (18-Oct-2020 0700)    PRN Medications: sodium chloride, acetaminophen, dextrose, fentaNYL (SUBLIMAZE) injection, fentaNYL (SUBLIMAZE) injection, heparin, heparin, midazolam, midazolam, ondansetron (ZOFRAN) IV, sodium chloride flush, sodium chloride flush    Patient Profile   70 y/o male with HTN,. DM2, OSA, CAD s/p CABG 6226, chronic systolic HF (EF 33-35% in 9/20), ESRD (started HD in 7/21).   Admitted cardiogenic shock- Angiography with stable CAD with 3/4 grafts patent. Felt to have myo-pericarditis. IABP+ pressors+ vent +CVVHD  Assessment/Plan  1. Acute on chronic systolic HF -> cardiogenic shock  - EF 40-45% in 9/20 due to ICM - Bedside echo EF 5-10% - hstrop 7k-> 14K-18K  - Cath 10/07/2020 with stable CAD -> suspect acute myo-pericarditis  - lactate > 11.0-->3.9-->1.5 ->2.3->2.5  - Now w/ worsening shock ? Developing septic shock component w/ increase pressor requirements and lower SVR. mTemp 99.5. WBC 1.9   - On NE 70 + DBA 10, Epi 2 + VP 0.03  - IABP 1:1  - CO-OX 51%. MAPs low 50s - Defer abx to PCCM  - On CVVHD for volume removal but unable to pull currently  - Not candidate for advanced therapies/durable VAD so no real role for Impella as bridge. Overall prognosis poor.  - Recommend palliative care consult    2. Acute hypoxic respiratory failure - due to pulmonary edema. PCWP 44 at cath - Continue CVVHD for volume removal  - CCM managing    3. ESRD - on HD since 7/21 - Continues on CVVHD. Nephrology appreciated.    4. Elevated hs troponin  with CAD s/p CABG 2017 - Bedside echo EF 5-10% - hstrop 7k-> 14k-:18K  - Cath 09/18/2020 with stable CAD -> suspect acute myo-pericarditis  - ASA/statin - no b-blocker with  shock   5. DM2 - SSI. Otherwise per primary   6. A flutterr RVR -Remains in A flutter. Continue amio drip.   -On heparin drip but plts falling, 156>>77K. Monitor closely   7. Myoclonic Jerks   - Neurology consulted - EEG No seizures or epileptiform discharges - Head CT negative for acute infarct. No evidence of diffuse anoxic brain Injury - ? Medication related (amio)   8. Elevated LFTS -Suspect in the setting of shock.  - Follow CMP   9. Thrombocytopenia  - Plt 156>>77K. Hgb ok at 13 - likely 2/2 critical illness - monitor while on heparin gtt   Length of Stay: [address] Rosita Fire, PA-C  10-06-20, 7:57 AM  Advanced Heart Failure Team Pager [phone] (M-F; 7a - 5p)  Please contact [facility] for night-coverage after hours (5p -7a ) and weekends on [email or web address]  Agree with above. He has worsening shock and MSOF despite high-dose pressors. Suspect possible septic component on top of profound cardiogenic shock. Currently unresponsive.  General:  Ill appearing. Sedated on vent HEENT: normal + ETT Neck: supple. + HD cath Carotids 2+ bilat; no bruits. No lymphadenopathy or thryomegaly appreciated. Cor: PMI nondisplaced. Regular tachy  + tunneled cath Lungs: clear Abdomen: soft, nontender, nondistended. No hepatosplenomegaly. No bruits or masses. Good bowel sounds. Extremities: no cyanosis, clubbing, rash, edema + RFA IABP Neuro: sedated unresponsive. + gag  He is terminally ill. I have d/w with his family and they were clear that he would not want more than 72 hours of aggressive care. We have now surpassed that point and he continues to deteriorate. Agree with transition to comfort care. D/w CCM.   CRITICAL CARE Performed by: Glori Bickers  Total critical care time: 35 minutes  Critical care  time was exclusive of separately billable procedures and treating other patients.  Critical care was necessary to treat or prevent imminent or life-threatening deterioration.  Critical care was time spent personally by me (independent of midlevel providers or residents) on the following activities: development of treatment plan with patient and/or surrogate as well as nursing, discussions with consultants, evaluation of patient's response to treatment, examination of patient, obtaining history from patient or surrogate, ordering and performing treatments and interventions, ordering and review of laboratory studies, ordering and review of radiographic studies, pulse oximetry and re-evaluation of patient's condition.  Glori Bickers, MD  9:28 AM

## 2020-10-13 NOTE — Progress Notes (Signed)
ANTICOAGULATION CONSULT NOTE - Follow Up Consult  Pharmacy Consult for Heparin  Indication:  IABP  and afib/flutter  Not on File  Patient Measurements: Height: 6\' 2"  (188 cm) Weight: 82.3 kg (181 lb 7 oz) IBW/kg (Calculated) : 82.2  Vital Signs: Temp: 97.7 F (36.5 C) (08/19 0700) Temp Source: Core (08/19 0400) BP: 117/70 (08/19 0700) Pulse Rate: 127 (08/19 0700)  Labs: Recent Labs    09/28/20 1137 09/28/20 1522 09/29/20 0341 09/29/20 1641 09/29/20 2316 09/29/20 2331 10/17/2020 0442  HGB  --    < > 14.0  --  13.6 14.6 13.1  HCT  --    < > 44.3  --  42.3 43.0 40.9  PLT  --   --  156  --  95*  --  77*  HEPARINUNFRC 0.37  --  0.43  --   --   --  0.27*  CREATININE  --    < > 2.99* 2.75* 2.54*  --  2.41*   < > = values in this interval not displayed.     Estimated Creatinine Clearance: 32.2 mL/min (A) (by C-G formula based on SCr of 2.41 mg/dL (H)).   Medical History: Past Medical History:  Diagnosis Date   Abnormal stress test 02/10/2015   Acute on chronic systolic and diastolic heart failure, NYHA class 1 ([place]) 12/31/2014   Anemia    Anemia in chronic kidney disease 09/03/2012   Arthritis    left  5th finger   Asthma    Asthma, chronic 06/04/2012   Asthma, chronic, mild intermittent, with acute exacerbation 12/22/2015   Bilateral lower extremity edema 12/22/2015   CAD (coronary artery disease) 02/18/2015   Chronic combined systolic (congestive) and diastolic (congestive) heart failure ([place])    a. 12/31/14: 2D ECHO: EF 40-45%, HK of inf myocardium, G1DD, mod MR   Chronic combined systolic and diastolic CHF (congestive heart failure) ([place]) 65/78/4696   Chronic systolic heart failure ([place]) 02/25/2015   CKD (chronic kidney disease), stage III (HCC)    stage 3 kidney disease   CKD (chronic kidney disease), stage V ([place]) 08/30/2018   COLONIC POLYPS, HX OF 04/13/2009   Qualifier: Diagnosis of  By: Westly Pam.    Coronary artery disease    a. LHC 01/2015 - triple  vessel CAD (mod oLM, mLAD, severe mRCA, intermediate branch stenosis, CTO of mCx). Plan CABG 02/2015.   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 Mild-mod by echo 12/2014.   Morbid obesity ([place]) 12/29/2014   Myocardial infarction [facility])    pt. states per [name] he has in the past   NSVT (nonsustained ventricular tachycardia) ([place])    a. 9 beats during 01/2015 adm. BB titrated.   Nuclear sclerotic cataract of left eye 05/26/2019   Nuclear sclerotic cataract of right eye 05/26/2019   Obesity    a. BMI  33   Obesity (BMI 30-39.9) 05/05/2013   OSA (obstructive sleep apnea) 01/16/2018   Mild obstructive sleep apnea overall with an AHI of 7.3/h and no significant central sleep apnea. Severe obstructive sleep apnea during REM sleep with an AHI of 34.3/h.  Now on CPAP at 6 cm H2O.    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 Pt was taken to cath where IABP  was placed. Pt has ESRD on HD.  Heparin level came back subtherapeutic at 0.27, on heparin at rate of 1100 units/hr + 500 uts/hr in CRRT circuit. No bleeding or issues with line per RN. Hemoglobin stable in the 13s, plt count trending down at 77. Goal of Therapy:  Heparin level 0.3-0.5 units/ml Monitor platelets by anticoagulation protocol: Yes   Plan:  Increase heparin drip up to 1050 units/hr with heparin 500 uts/hr in CRRT circuit  Will order recheck in 8 hours  Daily CBC/heparin level Monitor for bleeding   Antonietta Jewel, PharmD, [facility]  Phone: [phone] 10-28-2020 8:12 AM  Please check AMION for all [place] phone numbers After 10:00 PM, call [place] [phone]

## 2020-10-13 NOTE — Progress Notes (Signed)
Remaining 144mL of fentanyl infusion wasted in stericycle with melynn mulluer RN

## 2020-10-13 NOTE — Progress Notes (Signed)
[facility] Progress Note   Assessment/ Plan:   1.  Cardiogenic shock: Acute exacerbation of congestive heart failure with reduced ejection fraction and suspected to be viral myocarditis.+ IABP, 1:1.  On CRRT (8/16-).  All 4K bath, intracircuit heparin.  Keep even today.  On dobutamine and levophed, overall increased requirements.  CVP 11 today.   2.  End-stage renal disease: Usually on Monday/Wednesday/Friday schedule.  On CRRT 3.  Hyperkalemia: Resolved on CRRT 4.  Anion gap metabolic acidosis/lactic acidosis: Resolved on CRRT 5.  Anemia of chronic kidney disease: Hemoglobin and hematocrit currently acceptable, will continue to follow trend to decide on need for supplementation. 6.  Chronic kidney disease-metabolic bone disease: Currently n.p.o. while intubated, monitor off binders 7.  Thrombocytopenia: possibly infectious mediated- watch closely on systemic and intra-circuit heparin 8.  Neutropenia: suspect infectious mediated esp with low-grade fever- cultures drawn, vanc/ cefepime started per primary 9.  Afib: on amio  10.  Myoclonus: per neuro- doesn't look to have seizure activity 11.  Dispo: per pt's wife and dtr-- would not want to try to be kept on life support for > 72 hrs.  We're close to that.  Agree with no CPR in the event of a cardiac arrest.  They are calling family in today, including 2 grandchildren who are 7 and 14.  Anticipatory guidance provided to their mom (pt's dtr) on how to prepare them to see their grandfather  Subjective:    Seen and examined.  Afib overnight, had amio bolus.  Doing overall poorly- pressors up, low grade temp to 100.2, not able to pull any fluid on CRRT   Objective:   BP (!) 123/56   Pulse (!) 104   Temp (!) 97.5 F (36.4 C)   Resp (!) 25   Ht 6' 2" (1.88 m)   Wt 82.3 kg   SpO2 91%   BMI 23.30 kg/m   Physical Exam: Gen: intubated, sedated CVS: tachycardic, IABP in place, afib on monitor. Resp: coarse mech  bilaterally Abd: soft Ext: trace LE edema ACCESS: L IJ trialysis catheter in place, L AVF + T/B  Labs: BMET Recent Labs  Lab 10/07/2020 0325 09/23/2020 0451 10/08/2020 1552 10/08/2020 1950 09/28/20 0348 09/28/20 1522 09/28/20 1947 09/29/20 0341 09/29/20 1641 09/29/20 2316 09/29/20 2331 09/12/2020 0442  NA 137   < > 138   < > 135 135 138 134* 134* 136 137 135  K 5.3*   < > 4.0   < > 4.4 4.5 4.7 5.1 5.3* 4.8 4.8 4.6  CL 91*  --  97*  --  99 98  --  98 103 104  --  104  CO2 14*  --  24  --  25 26  --  22 22 23  --  20*  GLUCOSE 62*  --  149*  --  185* 190*  --  243* 263* 212*  --  225*  BUN 48*  --  53*  --  41* 32*  --  30* 35* 31*  --  30*  CREATININE 7.38*  --  6.58*  --  4.74* 3.74*  --  2.99* 2.75* 2.54*  --  2.41*  CALCIUM 8.7*  --  8.6*  --  8.8* 9.0  --  9.3 8.5* 8.6*  --  8.4*  PHOS 11.6*  --  6.9*  --  4.3 3.9  --  4.0 3.5  --   --  2.9   < > = values in this interval not   displayed.   CBC Recent Labs  Lab 09/25/2020 2024 09/26/20 2247 09/13/2020 0325 09/28/2020 0451 09/29/20 0341 09/29/20 2316 09/29/20 2331 10/02/2020 0442  WBC 7.6  --  8.6  --  11.5* 3.1*  --  1.9*  NEUTROABS 6.2  --   --   --   --  2.7  --   --   HGB 12.7*   < > 10.9*   < > 14.0 13.6 14.6 13.1  HCT 38.9*   < > 35.2*   < > 44.3 42.3 43.0 40.9  MCV 109.9*  --  113.9*  --  109.9* 109.3*  --  108.5*  PLT 239  --  184  --  156 95*  --  77*   < > = values in this interval not displayed.      Medications:     aspirin  81 mg Per Tube Daily   B-complex with vitamin C  1 tablet Per Tube Daily   chlorhexidine gluconate (MEDLINE KIT)  15 mL Mouth Rinse BID   Chlorhexidine Gluconate Cloth  6 each Topical Daily   docusate  100 mg Per Tube BID   feeding supplement (PROSource TF)  45 mL Per Tube QID   insulin aspart  0-6 Units Subcutaneous Q4H   insulin aspart  4 Units Subcutaneous Q4H   insulin detemir  4 Units Subcutaneous BID   mouth rinse  15 mL Mouth Rinse 10 times per day   pantoprazole sodium  40 mg Per  Tube Daily   polyethylene glycol  17 g Per Tube Daily   sodium chloride flush  10-40 mL Intracatheter Q12H   sodium chloride flush  3 mL Intravenous Q12H     Elizabeth Upton, MD 10/08/2020, 10:37 AM   

## 2020-10-13 NOTE — Progress Notes (Signed)
eLink Physician-Brief Progress Note Patient Name: Henry Carroll DOB: 03/24/1950 MRN: [id number]   Date of Service  10-12-20  HPI/Events of Note  Frequent loose stools - Nursing request for Flexiseal.   eICU Interventions  Plan Place Flexiseal.      Intervention Category Major Interventions: Other:  Lysle Dingwall October 12, 2020, 3:33 AM

## 2020-10-13 NOTE — Progress Notes (Signed)
LTM maintenance completed, checked under Fp1 and Fp2, no skin breakdown was seen. Also moved EKG leads as were not picking up. Tested event button.

## 2020-10-13 NOTE — Progress Notes (Signed)
Responded to unit page to support family with prayer per family request.  Prayed with family and provided  ministry of presence,emotional and grief support. Facilitated information sharing between family and staff.  Jaclynn Major, [place], [facility], Pager [phone]

## 2020-10-13 NOTE — Procedures (Addendum)
Patient Name: Henry Carroll  MRN: [id number]  Epilepsy Attending: Lora Havens  Referring Physician/Provider: Dr Trevor Mace  Duration: 10/04/20 0103 to Oct 04, 2020 1024   Patient history: 70 year old with intermittent twitching concerning for myoclonic jerks.  EEG to evaluate for seizures.   Level of alertness: lethargic   AEDs during EEG study: Keppra   Technical aspects: This EEG study was done with scalp electrodes positioned according to the 10-20 International system of electrode placement. Electrical activity was acquired at a sampling rate of 500Hz  and reviewed with a high frequency filter of 70Hz  and a low frequency filter of 1Hz . EEG data were recorded continuously and digitally stored.    Description: EEG showed continuous generalized 3 to 6 Hz theta-delta slowing. Hyperventilation and photic stimulation were not performed.     Patient event button was pressed on Oct 04, 2020 at 0231. During the episode patient was initially noted to have bilateral upper extremity jerking. Concomitant EEG before, during and after the event did not show any EEG to suggest seizure.   ABNORMALITY - Continuous slow, generalized   IMPRESSION: This study is suggestive of moderate to severe diffuse encephalopathy, nonspecific etiology. No seizures or epileptiform discharges were seen throughout the recording.  Patient event button was pressed on 10-04-20 at 0231 as described above without concomitant EEG change.  This was not an epileptic event.  Of note, semiology is concerning for subcortical myoclonus.  Jaymarion Trombly Barbra Sarks

## 2020-10-13 NOTE — Progress Notes (Addendum)
2030 Patient went into a flutter (see EKG at 2036) and then a fib with rates in 120s and becoming more hypotensive.  Cardiology was paged and ordered for an amio bolus.   2230 Increasing use of pressors, augmented MAPs in 60s. Positive on CRRT. Orders from cardiology to give 12.5g of albumin. CMP, CBC, lactic acid, co-ox and ABG were drawn. CO/CI at this time was 3.5/1.6.   0430 Patient maxed out on NE at 70 mcg, dobutamine at 10 mcg. CRRT unable to keep even throughout shift given BP. MAP borderline and HR in 120s. Vaso added per cardiology

## 2020-10-13 NOTE — Progress Notes (Addendum)
NAME:  Henry Carroll, MRN:  [id number], DOB:  1950/05/09, LOS: 3 ADMISSION DATE:  10/06/2020, CONSULTATION DATE:  8/16 REFERRING MD:  Dr. Haroldine Laws , CHIEF COMPLAINT:  cardiogenic shock   History of Present Illness:  Patient is encephalopathic and/or intubated. Therefore history has been obtained from chart review.  70 year old male with past medical history as below, which is significant for end-stage renal disease on dialysis with most recent dialysis 8/15, coronary artery disease status post four-vessel CABG, heart failure with reduced ejection fraction, asthma, diabetes mellitus, and obstructive sleep apnea. He presented to [facility] ED 8/15 with complaints of chest pain x 2 days. In the ED he was noted to be hypotensive with cool extremities concerning for cardiogenic shock. Bedside echo showed severely decreased ejection fraction. Troponin was elevated. He was transferred to [facility] for possible invasive cardiac evaluation.   Upon arrival to [facility] he was taken immediately to the cath lab for NSTEMI. He was not able to tolerate the supine position and required intubation. Grafts were felt to be patent, but EF noted to be around 5-10%. Balloon pump placed. He did not require pressors during the case. Post procedurally he was taken to the ICU for ongoing care.   Pertinent  Medical History   has a past medical history of Abnormal stress test (02/10/2015), Acute on chronic systolic and diastolic heart failure, NYHA class 1 ([place]) (12/31/2014), Anemia, Anemia in chronic kidney disease (09/03/2012), Arthritis, Asthma, Asthma, chronic (06/04/2012), Asthma, chronic, mild intermittent, with acute exacerbation (12/22/2015), Bilateral lower extremity edema (12/22/2015), CAD (coronary artery disease) (02/18/2015), Chronic combined systolic (congestive) and diastolic (congestive) heart failure ([place]), Chronic combined systolic and diastolic CHF (congestive heart failure) ([place]) (02/10/2015),  Chronic systolic heart failure ([place]) (02/25/2015), CKD (chronic kidney disease), stage III ([place]), CKD (chronic kidney disease), stage V ([place]) (08/30/2018), COLONIC POLYPS, HX OF (04/13/2009), Coronary artery disease, Demand ischemia ([place]) (12/31/2014), DM type 2, uncontrolled, with renal complications ([place]) (0/34/7425), Dyspnea, Elevated troponin, Essential hypertension (9/56/3875), Folliculitis of perineum (10/01/2012), Gastric erosion, GERD (gastroesophageal reflux disease), History of blood transfusion, Hyperlipidemia, Hyperlipidemia with target LDL less than 100 (12/31/2014), Hypertension, Hypertensive heart disease with heart failure ([place]) (02/25/2015), IDA (iron deficiency anemia) (01/23/2016), Melena (01/23/2016), 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), Mitral regurgitation, Morbid obesity ([place]) (12/29/2014), Myocardial infarction [facility]), NSVT (nonsustained ventricular tachycardia) ([place]), Nuclear sclerotic cataract of left eye (05/26/2019), Nuclear sclerotic cataract of right eye (05/26/2019), Obesity, Obesity (BMI 30-39.9) (05/05/2013), OSA (obstructive sleep apnea) (01/16/2018), Other malaise and fatigue (09/03/2012), 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  Significant Hospital Events: Including procedures, antibiotic start and stop dates in addition to other pertinent events   8/16 admit to [facility].  Intubated.  Right femoral PAC placed. IV heparin started.IABP placed. Inotrope support. HD cath placed for CRRT 8/17 oozing from HD cath insertion site, injected subq with lido+epi, started on amio for Aflutter   Interim History / Subjective:   Neuro consulted overnight for twitching, remains on cEEG, s/p load with  keppra, pending CTH today Remains on IABP 1:1, coox down 65.5-> 52% ( From 47%) Remains on CRRT, UF only, kept even overnight due to increasing vasopressor requirements NE at 70  mcg/min and dobutamine 10 mcg/kg/min, Vasopressin at 0.03, Epi at 2  w/ amio and heparin gtts Sedated on precedex and  fentanyl  MAP's in 50's CVP 10, PA 38/26, CI 2.05 CO 4.29, SVR ~840 Tmax 99.5, WBC 1.9 +1.6L/ net - 5.1 L Worsening LFTs, increasing glucose  8/15 blood Cx No growth.  8/17 Started on amio for A flutter. Neurology consulted for extreme twitching. EEG>>moderate to severe diffuse encephalopathy, nonspecific etiology. No seizures or epileptiform discharges  8/18 Head CT negative for acute infarct. No evidence of diffuse anoxic brain injury  Na 135, K 4.6, CO2 is 20, BUN 30, Creatinine 2.41, Phos 2.9, Mag 2.3 HGB 13.1, WBC 1.9, Platelets 77 Lactate 2.5  Upward Trend: ( 1.5>>2.3>>2.5 )  Objective   Blood pressure (!) 86/37, pulse (!) 106, temperature 97.7 F (36.5 C), resp. rate (!) 21, height 6\' 2"  (1.88 m), weight 82.3 kg, SpO2 100 %. PAP: (35-56)/(22-33) 38/26 CVP:  [7 mmHg-43 mmHg] 9 mmHg CO:  [3.4 L/min-4.3 L/min] 4.3 L/min CI:  [1.6 L/min/m2-2.1 L/min/m2] 2.1 L/min/m2  Vent Mode: PRVC FiO2 (%):  [40 %-60 %] 60 % Set Rate:  [16 bmp] 16 bmp Vt Set:  [570 mL] 570 mL PEEP:  [5 cmH20] 5 cmH20 Plateau Pressure:  [17 cmH20-26 cmH20] 26 cmH20   Intake/Output Summary (Last 24 hours) at 10/27/20 0848 Last data filed at 2020-10-27 0800 Gross per 24 hour  Intake 4408.26 ml  Output 2571 ml  Net 1837.26 ml   Filed Weights   09/28/20 0500 09/29/20 0500 27-Oct-2020 0400  Weight: 89.7 kg 82 kg 82.3 kg   General:  critically ill elderly male sedated/ intubated on MV HEENT: MM pink/moist, ETT at 23 at lip, OGT, pupils 2/reactive, +right corneal reflex, left absent Neuro: sedated on fent 50, precedex 0.5, flicker in RLE/ RUE to noxious stimuli, flaccid on left CV: rr, distant heart sounds, some  mechanical sounds, right femoral site wnl, +1 dp pulses, feet cool to touch PULM:  Bilateral chest excursion, triggering MV, non labored, clear bilaterally  GI: soft, +bs, mechanical sounds, ND, NT Extremities: cool to touch /dry, no LE edema L IJ HD site wnl, no further oozing   Resolved Hospital Problem list     Assessment & Plan:   Acute on chronic HFrEF with Cardiogenic shock: has been worked up by cardiology. It is not felt to be ischemic in nature. Viral myo-pericarditis? EF at 5-10% by bedside echo. Balloon pump placed in cath lab 8/16.  -Co-Oximetry 52% - Unable to pull any volume with CVVH , so net +1,6 L last 24 , Net negative 4.9 L - Added Epi and Vasopressin overnight  Plan: Per HF team  Continues on IABP 1:1 CRRT for volume removal; current goal even balance Heparin per pharmacy  Cont ASA/ statin Trending hemodynamics, coox  Not a candidate for advanced therapies per HF.  Overall prognosis is poor.   Acute hypoxemic respiratory failure OSA Endotracheal tube in sinus position, PICC line in satisfactory position.  Progressive bilateral patchy airspace disease left greater than right.  Cannot exclude an element of edema.  Endotracheal tube 3 position.  Cardiomegaly vascular fullness but no pulmonary edema Plan Continue MV support, 8cc/kg IBW with goal Pplat <30 and DP<15  VAP prevention protocol/ PPI PAD protocol for sedation> fentanyl/ precedex, wean after CTH, RASS goal -1/-2 Wean FiO2 as able for SpO2 >92%  CRRT for volume   NSTEMI:  likely demand ischemia for poor global perfusion Plan Per cardiology/ HF, as above  ESRD on HD (MWF) Hyponatremia: hypervolemic AGMA/lactic acidosis: lactic acid 11 on admission.   Remains elevated,  LFTs are elevated  which is likely due to hepatic congestion>> resulting in lactate clearing slowly>> Lactate continues to rise overnight despite addition of epi and vasopressin -Nephrology consulted 8/16 Plan CRRT per nephrology   Trending BMET  Abnormal LFTs.  Suspect shock liver versus hepatic congestion Plan Trend LFTs  DM Plan SSI, goal CBGs 140-180 Adding TF coverage and levemir.  May need to transition to gtt   R/o seizure vs myoclonus  CT Head 8/18 negative for acute infarct. No evidence of diffuse anoxic brain injury Plan Per Neurology.  cEEG ongoing- no seizures thus far.  AEDs per neurology   Neutropenia Blood Cx negative thus far Plan Trend WBC and Fever Curve,  Continue ABX   Pt. Has continued to decline overnight despite additional support. Dr. Tamala Julian has spoken with family who are in agreement with transitioning goals of care to comfort care once family have  had time to say their goodbyes. Dr. Haroldine Laws is in agreement with the above plan. Will transition to Comfort care once family is ready.   Best Practice (right click and "Reselect all SmartList Selections" daily)   Diet/type: NPO; TF DVT prophylaxis: prophylactic heparin  GI prophylaxis: PPI Lines: Central line Foley:  N/A Code Status:  full code Last date of multidisciplinary goals of care discussion []  Wife and daughter updated at bedside.   CCT 35 mins  Magdalen Spatz, MSN, AGACNP-BC [place] for personal pager PCCM on call pager [phone]  18-Oct-2020, 8:48 AM

## 2020-10-13 NOTE — Progress Notes (Signed)
Inpatient Diabetes Program Recommendations  AACE/ADA: New Consensus Statement on Inpatient Glycemic Control   Target Ranges:  Prepandial:   less than 140 mg/dL      Peak postprandial:   less than 180 mg/dL (1-2 hours)      Critically ill patients:  140 - 180 mg/dL   Results for EMAAD, NANNA (MRN [id number]) as of 2020-10-15 11:20  Ref. Range 09/29/2020 08:32 09/29/2020 12:30 09/29/2020 15:47 09/29/2020 19:46 09/29/2020 23:20 Oct 15, 2020 04:51 Oct 15, 2020 08:22  Glucose-Capillary Latest Ref Range: 70 - 99 mg/dL 323 (H) 278 (H) 232 (H) 269 (H) 186 (H) 214 (H) 209 (H)    Review of Glycemic Control  Diabetes history: DM2 Outpatient Diabetes medications: Levemir 18 units QHS Current orders for Inpatient glycemic control: Levemir 4 units Q12H, Novolog 0-6 units Q4H, Novolog 4 units Q4H for tube feeding coverage; Vital @ 55 ml/hr   Inpatient Diabetes Program Recommendations:     Insulin: Please consider increasing Levemir to 8 units Q12H and tube feeding coverage to Novolog 6 units Q4H. If tube feeding is stopped or held then Novolog tube feeding coverage should also be stopped or held.  Thanks, Barnie Alderman, RN, MSN, CDE Diabetes Coordinator Inpatient Diabetes Program [phone] (Team Pager from 8am to 5pm)

## 2020-10-13 NOTE — Progress Notes (Signed)
Patient was compassionately extubated to room air surrounded by family at the bedside. RN present during extubation.

## 2020-10-13 DEATH — deceased

## 2020-10-19 ENCOUNTER — Other Ambulatory Visit (HOSPITAL_COMMUNITY): Payer: Medicare Other

## 2020-10-26 ENCOUNTER — Ambulatory Visit (HOSPITAL_COMMUNITY): Payer: Medicare Other | Admitting: Hematology

## 2020-10-27 ENCOUNTER — Encounter (HOSPITAL_COMMUNITY): Payer: Self-pay | Admitting: *Deleted

## 2020-11-12 NOTE — Death Summary Note (Signed)
DEATH SUMMARY   Patient Details  Name: Henry Carroll MRN: [id number] DOB: Aug 23, 1950  Admission/Discharge Information   Admit Date:  Oct 13, 2020  Date of Death: Date of Death: 10-17-2020  Time of Death: Time of Death: May 21, 1509  Length of Stay: 3  Referring Physician: [name], NP   Reason(s) for Hospitalization  Acute on chronic HFrEF with cardiogenic shock, nonischemic Acute hypoxemic respiratory failure due to pulmonary edema and/or aspiration pneumonitis Shock liver ESRD Bone marrow failure Metabolic associated myoclonus Morbid obesity DM2 HTN HLD Chronic anemia  Diagnoses  Preliminary cause of death:  Secondary Diagnoses (including complications and co-morbidities):  Active Problems:   Cardiogenic shock (HCC)   Acute viral myocarditis   ESRD (end stage renal disease) on dialysis ([facility])   Acute hypoxemic respiratory failure (HCC)   Malnutrition of moderate degree   Brief Hospital Course (including significant findings, care, treatment, and services provided and events leading to death)  70 year old male with past medical history as below, which is significant for end-stage renal disease on dialysis with most recent dialysis Oct 14, 2022, coronary artery disease status post four-vessel CABG, heart failure with reduced ejection fraction, asthma, diabetes mellitus, and obstructive sleep apnea. He presented to [facility] ED 2022-10-14 with complaints of chest pain x 2 days. In the ED he was noted to be hypotensive with cool extremities concerning for cardiogenic shock. Bedside echo showed severely decreased ejection fraction. Troponin was elevated. He was transferred to [facility] for possible invasive cardiac evaluation.    Upon arrival to [facility] he was taken immediately to the cath lab for NSTEMI. He was not able to tolerate the supine position and required intubation. Grafts were felt to be patent, but EF noted to be around 5-10%. Balloon pump placed. He did not  require pressors during the case. Post procedurally he was taken to the ICU for ongoing care.    Despite extremely aggressive care (balloon pump, vent, CRRT, pressors, inotropes), patient continued to deteriorate with progressive acidemia, liver failure, bone marrow failure, escalating pressor needs.  After GOC discusssions family elected to allow him to pass in peace.  Pertinent Labs and Studies  Significant Diagnostic Studies CT HEAD WO CONTRAST (5MM)  Result Date: 09/29/2020 CLINICAL DATA:  Anoxic brain damage. EXAM: CT HEAD WITHOUT CONTRAST TECHNIQUE: Contiguous axial images were obtained from the base of the skull through the vertex without intravenous contrast. COMPARISON:  None. FINDINGS: Brain: There is age related volume loss. There is a good deal of artifact relating to what probably represents an EEG. There is small vessel change of the white matter but no sign of acute infarction, mass lesion, hemorrhage, hydrocephalus or extra-axial collection. Vascular: There is atherosclerotic calcification of the major vessels at the base of the brain. Skull: Negative Sinuses/Orbits: Clear/normal Other: None IMPRESSION: Considerable artifact related to EEG. No evidence of focal acute infarction or diffuse anoxic brain injury. Age related volume loss and chronic small-vessel ischemic change of the white matter. Electronically Signed   By: Nelson Chimes M.D.   On: 09/29/2020 13:29   CARDIAC CATHETERIZATION  Result Date: 09/20/2020   Ost LM lesion is 50% stenosed.   Prox LAD lesion is 80% stenosed.  LIMA to LAD is patent.   Dist LAD lesion is 60% stenosed.   Ramus lesion is 90% stenosed.  Radial graft to ramus widely patent.   Prox Cx to Mid Cx lesion is 100% stenosed.  SVG to OM occluded.   Prox RCA lesion is 100% stenosed.  SVG to PDA is widely patent.   Ost RPDA lesion is 40% stenosed.   2nd Diag lesion is 40% stenosed.   There is severe left ventricular systolic dysfunction.   LV end diastolic pressure is  moderately elevated.   The left ventricular ejection fraction is less than 25% by visual estimate.   There is no aortic valve stenosis.   Successful intra-aortic balloon pump placement.   Hemodynamic findings consistent with moderate pulmonary hypertension.   Ao sat 100%, PA sat 65%, mean PA pressure 41 mmHg, mean wedge pressure 41 mmHg, cardiac output 4.57 L/min; cardiac index 2.1. Three of four grafts are patent.  LV dysfunction out of proportion to degree of CAD.  Nonischemic cause of acute systolic dysfunction.  Most likely, he has a viral myocarditis.  Hopefully, he will recover with hemodynamic support of IABP.  Case discussed with CCM as well.  They will place a temporary dialysis catheter for CVVHD. Heart failure service to consult as well.   DG CHEST PORT 1 VIEW  Result Date: 2020/10/04 CLINICAL DATA:  Intra-aortic balloon pump. EXAM: PORTABLE CHEST 1 VIEW COMPARISON:  September 29, 2020. FINDINGS: Stable cardiomediastinal silhouette. Endotracheal and nasogastric tubes are unchanged in position. Swan-Ganz catheter is unchanged in position. Left internal jugular catheter is unchanged. Distal tip of aortic balloon catheter is seen projected over aortic knob which is stable. Bilateral lung opacities are noted, right greater than left. Bony thorax is unremarkable. IMPRESSION: Stable support apparatus. Bilateral lung opacities, right greater than left. Electronically Signed   By: Marijo Conception M.D.   On: 10-04-20 08:55   DG CHEST PORT 1 VIEW  Result Date: 09/29/2020 CLINICAL DATA:  Rhonchi. EXAM: PORTABLE CHEST 1 VIEW COMPARISON:  September 29, 2020 (5:21 a.m.) FINDINGS: There is stable endotracheal tube, nasogastric tube and left internal jugular venous catheter positioning. Multiple sternal wires are present. Right basilar atelectasis and/or infiltrate is seen. This is mildly increased in severity when compared to the prior exam. No pneumothorax is identified. The cardiac silhouette is enlarged and  unchanged in size. The visualized skeletal structures are unremarkable. IMPRESSION: Right basilar atelectasis and/or infiltrate, increased in severity when compared to the prior study. Electronically Signed   By: Virgina Norfolk M.D.   On: 09/29/2020 19:50   DG CHEST PORT 1 VIEW  Result Date: 09/29/2020 CLINICAL DATA:  Aortic balloon catheter. EXAM: PORTABLE CHEST 1 VIEW COMPARISON:  September 28, 2020. FINDINGS: Stable cardiomediastinal silhouette. Endotracheal and nasogastric tubes are unchanged in position. Left internal jugular catheter is unchanged. Stable position of Swan-Ganz catheter with tip directed into right pulmonary artery. Distal tip of aortic balloon catheter is seen projected over aortic knob. No pneumothorax is noted. Bibasilar atelectasis is noted. Small bilateral pleural effusions are noted. Bony thorax is unremarkable. IMPRESSION: Stable support apparatus. Distal tip of aortic balloon catheter is seen projected over aortic knob. Bibasilar atelectasis is noted with probable small pleural effusions. Electronically Signed   By: Marijo Conception M.D.   On: 09/29/2020 08:26   DG CHEST PORT 1 VIEW  Result Date: 09/28/2020 CLINICAL DATA:  ETT placement EXAM: PORTABLE CHEST 1 VIEW COMPARISON:  09/26/2020 FINDINGS: Endotracheal tube with the tip 4.4 cm above the carina. Nasogastric tube coursing below the diaphragm. Left jugular central venous catheter with the tip projecting over the SVC. Swan-Ganz catheter with the tip projecting over the right main pulmonary artery. No focal consolidation, pleural effusion or pneumothorax. Stable cardiomediastinal silhouette. Prior CABG. No acute osseous abnormality. IMPRESSION: 1. Support  lines and tubing in satisfactory position. Electronically Signed   By: Kathreen Devoid M.D.   On: 09/28/2020 08:47   DG CHEST PORT 1 VIEW  Result Date: 09/29/2020 CLINICAL DATA:  Central line placement EXAM: PORTABLE CHEST 1 VIEW COMPARISON:  Earlier same day FINDINGS: New  left IJ central line tip overlies SVC. No pneumothorax. Endotracheal tube, partially imaged enteric tube, and femoral approach Swan-Ganz catheter are unchanged. Low lung volumes. No new consolidation or edema. No pleural effusion. Stable cardiomediastinal contours. IMPRESSION: New left IJ central line tip overlies SVC. No pneumothorax. Otherwise stable lines and tubes. Electronically Signed   By: Macy Mis M.D.   On: 10/05/2020 09:40   DG CHEST PORT 1 VIEW  Result Date: 10/04/2020 CLINICAL DATA:  Tube placement. EXAM: PORTABLE CHEST 1 VIEW COMPARISON:  September 26, 2020. FINDINGS: Endotracheal tube approximately 5 cm from the carina just below the clavicular heads. Gastric tube courses through and off the field of the radiograph, tip perhaps in proximal stomach. Incompletely visualized. Swan-Ganz catheter enters via inferior approach terminating in the area of the RIGHT hilum likely in RIGHT main pulmonary artery. Marker for intra-aortic balloon pump projecting at the level of the aortic arch. Metallic marker approximately 2.5-3 cm above the carina. EKG leads are in place. Signs of median sternotomy and CABG. Heart size is stable with mild to moderate cardiac enlargement. Hilar structures with pulmonary vascular congestion. No frank edema. No signs of airspace consolidation. No visible pneumothorax. On limited assessment no acute skeletal process. IMPRESSION: 1. No signs of edema or consolidation. 2. ET tube and Swan-Ganz catheter position as described. 3. Borderline high position of the IABP marker at the level of the aortic arch approximately 2.5-3 cm above the carina. 4. Gastric tube courses through and off the field of the radiograph, tip likely in proximal stomach incompletely assessed. Electronically Signed   By: Zetta Bills M.D.   On: 09/26/2020 08:14   DG Chest Portable 1 View  Result Date: 10/01/2020 CLINICAL DATA:  Cough EXAM: PORTABLE CHEST 1 VIEW COMPARISON:  06/03/2020 FINDINGS: Post  sternotomy changes. Cardiomegaly. Aortic atherosclerosis. Possible tiny left effusion. No pneumothorax IMPRESSION: Cardiomegaly with possible trace left effusion Electronically Signed   By: Donavan Foil M.D.   On: 09/19/2020 20:52   Overnight EEG with video  Result Date: 09/29/2020 Lora Havens, MD     10-23-2020  9:24 AM Patient Name: Henry Carroll MRN: [id number] Epilepsy Attending: Lora Havens Referring Physician/Provider: Dr Trevor Mace Duration: 09/29/2020 0103 to 10/23/20 0103 Patient history: 70 year old with intermittent twitching concerning for myoclonic jerks.  EEG to evaluate for seizures. Level of alertness: lethargic AEDs during EEG study: Keppra Technical aspects: This EEG study was done with scalp electrodes positioned according to the 10-20 International system of electrode placement. Electrical activity was acquired at a sampling rate of 500Hz  and reviewed with a high frequency filter of 70Hz  and a low frequency filter of 1Hz . EEG data were recorded continuously and digitally stored. Description: EEG showed continuous generalized 3 to 6 Hz theta-delta slowing. Hyperventilation and photic stimulation were not performed.   Patient event button was pressed on 09/29/2020 at 1906. During the episode patient was initially noted to have right shoulder jerks followed by bilateral upper extremity elevation. She then had a brief right upper extremity jerking. Patient was then noted to have semipurposeful movements.  Concomitant EEG before, during and after the event did not show any EEG to suggest seizure. ABNORMALITY - Continuous slow, generalized IMPRESSION:  This study is suggestive of moderate to severe diffuse encephalopathy, nonspecific etiology. No seizures or epileptiform discharges were seen throughout the recording. Patient event button was pressed on 09/29/2020 at 1906 as described above without concomitant EEG change.  This was most likely not an epileptic event.  However,  focal motor seizures may not be seen on scalp EEG.  Therefore clinical correlation is recommended. Lora Havens   ECHOCARDIOGRAM COMPLETE  Result Date: 10/09/2020    ECHOCARDIOGRAM REPORT   Patient Name:   Henry Carroll Date of Exam: 09/20/2020 Medical Rec #:  [id number]           Height:       74.0 in Accession #:    [id number]          Weight:       200.0 lb Date of Birth:  07/23/1950           BSA:          2.173 m Patient Age:    39 years            BP:           117/59 mmHg Patient Gender: M                   HR:           115 bpm. Exam Location:  Inpatient Procedure: 2D Echo, Cardiac Doppler, Color Doppler and Intracardiac            Opacification Agent Indications:    Abnormal EKG  History:        Patient has prior history of Echocardiogram examinations, most                 recent 11/03/2018. CHF, CAD, COPD, Arrythmias:NSVT,                 Signs/Symptoms:Murmur; Risk Factors:Dyslipidemia, Diabetes and                 Hypertension. 02/09/15 Left heart cath                 02/18/2015 CABG x 4.  Sonographer:    Luisa Hart RDCS Referring Phys: 2 JAYADEEP S VARANASI  Sonographer Comments: Echo performed with patient supine and on artificial respirator. IMPRESSIONS  1. Left ventricular ejection fraction, by estimation, is 20 to 25%. The left ventricle has severely decreased function. The left ventricle demonstrates global hypokinesis. The left ventricular internal cavity size was moderately dilated. Left ventricular diastolic parameters are consistent with Grade I diastolic dysfunction (impaired relaxation).  2. Right ventricular systolic function is normal. The right ventricular size is normal. There is moderately elevated pulmonary artery systolic pressure.  3. Left atrial size was moderately dilated.  4. The pericardial effusion is posterior to the left ventricle.  5. The mitral valve is abnormal. Mild mitral valve regurgitation. No evidence of mitral stenosis. Severe mitral annular  calcification.  6. The aortic valve is calcified. There is moderate calcification of the aortic valve. There is moderate thickening of the aortic valve. Aortic valve regurgitation is mild. Sclerosis without stenosis.  7. The inferior vena cava is normal in size with greater than 50% respiratory variability, suggesting right atrial pressure of 3 mmHg. FINDINGS  Left Ventricle: Left ventricular ejection fraction, by estimation, is 20 to 25%. The left ventricle has severely decreased function. The left ventricle demonstrates global hypokinesis. Definity contrast agent was given IV to delineate the left ventricular endocardial borders. The left  ventricular internal cavity size was moderately dilated. There is no left ventricular hypertrophy. Left ventricular diastolic parameters are consistent with Grade I diastolic dysfunction (impaired relaxation). Right Ventricle: The right ventricular size is normal. No increase in right ventricular wall thickness. Right ventricular systolic function is normal. There is moderately elevated pulmonary artery systolic pressure. The tricuspid regurgitant velocity is 2.76 m/s, and with an assumed right atrial pressure of 15 mmHg, the estimated right ventricular systolic pressure is 09.3 mmHg. Left Atrium: Left atrial size was moderately dilated. Right Atrium: Right atrial size was normal in size. Pericardium: Trivial pericardial effusion is present. The pericardial effusion is posterior to the left ventricle. Mitral Valve: The mitral valve is abnormal. There is moderate thickening of the mitral valve leaflet(s). There is moderate calcification of the mitral valve leaflet(s). Severe mitral annular calcification. Mild mitral valve regurgitation. No evidence of mitral valve stenosis. Tricuspid Valve: The tricuspid valve is normal in structure. Tricuspid valve regurgitation is mild . No evidence of tricuspid stenosis. Aortic Valve: The aortic valve is calcified. There is moderate  calcification of the aortic valve. There is moderate thickening of the aortic valve. Aortic valve regurgitation is mild. Sclerosis without stenosis. Aortic valve mean gradient measures 4.0 mmHg. Aortic valve peak gradient measures 7.8 mmHg. Aortic valve area, by VTI measures 1.39 cm. Pulmonic Valve: The pulmonic valve was normal in structure. Pulmonic valve regurgitation is not visualized. No evidence of pulmonic stenosis. Aorta: The aortic root is normal in size and structure. Venous: The inferior vena cava is normal in size with greater than 50% respiratory variability, suggesting right atrial pressure of 3 mmHg. IAS/Shunts: No atrial level shunt detected by color flow Doppler.  LEFT VENTRICLE PLAX 2D LVIDd:         5.90 cm      Diastology LVIDs:         5.50 cm      LV e' medial:   2.85 cm/s LV PW:         0.90 cm      LV E/e' medial: 12.4 LV IVS:        1.10 cm LVOT diam:     1.80 cm LV SV:         25 LV SV Index:   12 LVOT Area:     2.54 cm  LV Volumes (MOD) LV vol d, MOD A2C: 185.0 ml LV vol d, MOD A4C: 160.0 ml LV vol s, MOD A2C: 149.0 ml LV vol s, MOD A4C: 116.0 ml LV SV MOD A2C:     36.0 ml LV SV MOD A4C:     160.0 ml LV SV MOD BP:      42.7 ml RIGHT VENTRICLE RV Basal diam:  4.20 cm RV Mid diam:    2.20 cm RV S prime:     7.80 cm/s TAPSE (M-mode): 0.7 cm LEFT ATRIUM            Index       RIGHT ATRIUM           Index LA diam:      4.30 cm  1.98 cm/m  RA Area:     24.10 cm LA Vol (A2C): 104.0 ml 47.85 ml/m RA Volume:   78.00 ml  35.89 ml/m  AORTIC VALVE                   PULMONIC VALVE AV Area (Vmax):    1.50 cm    PV Vmax:  0.88 m/s AV Area (Vmean):   1.41 cm    PV Vmean:      61.100 cm/s AV Area (VTI):     1.39 cm    PV VTI:        0.119 m AV Vmax:           140.00 cm/s PV Peak grad:  3.1 mmHg AV Vmean:          95.000 cm/s PV Mean grad:  2.0 mmHg AV VTI:            0.182 m AV Peak Grad:      7.8 mmHg AV Mean Grad:      4.0 mmHg LVOT Vmax:         82.60 cm/s LVOT Vmean:        52.600 cm/s  LVOT VTI:          0.100 m LVOT/AV VTI ratio: 0.55 AR Vena Contracta: 0.20 cm  AORTA Ao Root diam: 3.60 cm Ao Asc diam:  3.50 cm MITRAL VALVE                 TRICUSPID VALVE MV Area (PHT): 7.37 cm      TR Peak grad:   30.5 mmHg MV Decel Time: 103 msec      TR Vmax:        276.00 cm/s MR Peak grad:    72.9 mmHg MR Mean grad:    42.0 mmHg   SHUNTS MR Vmax:         427.00 cm/s Systemic VTI:  0.10 m MR Vmean:        298.0 cm/s  Systemic Diam: 1.80 cm MR PISA:         1.01 cm MR PISA Eff ROA: 5 mm MR PISA Radius:  0.40 cm MV E velocity: 35.20 cm/s MV A velocity: 114.00 cm/s MV E/A ratio:  0.31 Jenkins Rouge MD Electronically signed by Jenkins Rouge MD Signature Date/Time: 09/28/2020/11:36:29 AM    Final     Microbiology No results found for this or any previous visit (from the past 240 hour(s)).  Lab Basic Metabolic Panel: No results for input(s): NA, K, CL, CO2, GLUCOSE, BUN, CREATININE, CALCIUM, MG, PHOS in the last 168 hours. Liver Function Tests: No results for input(s): AST, ALT, ALKPHOS, BILITOT, PROT, ALBUMIN in the last 168 hours. No results for input(s): LIPASE, AMYLASE in the last 168 hours. No results for input(s): AMMONIA in the last 168 hours. CBC: No results for input(s): WBC, NEUTROABS, HGB, HCT, MCV, PLT in the last 168 hours. Cardiac Enzymes: No results for input(s): CKTOTAL, CKMB, CKMBINDEX, TROPONINI in the last 168 hours. Sepsis Labs: No results for input(s): PROCALCITON, WBC, LATICACIDVEN in the last 168 hours.   Candee Furbish 10/14/2020, 10:55 AM

## 2021-01-23 ENCOUNTER — Ambulatory Visit: Payer: Medicare Other | Admitting: Nurse Practitioner

## 2021-01-25 ENCOUNTER — Ambulatory Visit: Payer: Medicare Other | Admitting: Nurse Practitioner

## 2021-06-27 ENCOUNTER — Encounter: Payer: Medicare Other | Admitting: Nurse Practitioner
